# Patient Record
Sex: Female | Born: 1945
Health system: Southern US, Community
[De-identification: ages and names within clinical notes are randomized; demographics above are authoritative.]

## PROBLEM LIST (undated history)

## (undated) DIAGNOSIS — E669 Obesity, unspecified: Secondary | ICD-10-CM

## (undated) DIAGNOSIS — Z9114 Patient's other noncompliance with medication regimen: Secondary | ICD-10-CM

## (undated) DIAGNOSIS — J45909 Unspecified asthma, uncomplicated: Secondary | ICD-10-CM

## (undated) DIAGNOSIS — R0989 Other specified symptoms and signs involving the circulatory and respiratory systems: Secondary | ICD-10-CM

## (undated) DIAGNOSIS — N289 Disorder of kidney and ureter, unspecified: Secondary | ICD-10-CM

## (undated) DIAGNOSIS — D125 Benign neoplasm of sigmoid colon: Secondary | ICD-10-CM

## (undated) DIAGNOSIS — I5020 Unspecified systolic (congestive) heart failure: Secondary | ICD-10-CM

## (undated) DIAGNOSIS — K921 Melena: Secondary | ICD-10-CM

## (undated) DIAGNOSIS — I2510 Atherosclerotic heart disease of native coronary artery without angina pectoris: Secondary | ICD-10-CM

## (undated) DIAGNOSIS — K2970 Gastritis, unspecified, without bleeding: Secondary | ICD-10-CM

## (undated) DIAGNOSIS — D122 Benign neoplasm of ascending colon: Secondary | ICD-10-CM

## (undated) DIAGNOSIS — Z9289 Personal history of other medical treatment: Secondary | ICD-10-CM

## (undated) DIAGNOSIS — E870 Hyperosmolality and hypernatremia: Secondary | ICD-10-CM

## (undated) DIAGNOSIS — M1990 Unspecified osteoarthritis, unspecified site: Secondary | ICD-10-CM

## (undated) DIAGNOSIS — N186 End stage renal disease: Secondary | ICD-10-CM

## (undated) DIAGNOSIS — J9602 Acute respiratory failure with hypercapnia: Secondary | ICD-10-CM

## (undated) DIAGNOSIS — D329 Benign neoplasm of meninges, unspecified: Secondary | ICD-10-CM

## (undated) DIAGNOSIS — D649 Anemia, unspecified: Secondary | ICD-10-CM

## (undated) DIAGNOSIS — R410 Disorientation, unspecified: Secondary | ICD-10-CM

## (undated) DIAGNOSIS — Z91148 Patient's other noncompliance with medication regimen for other reason: Secondary | ICD-10-CM

## (undated) DIAGNOSIS — I5041 Acute combined systolic (congestive) and diastolic (congestive) heart failure: Secondary | ICD-10-CM

## (undated) DIAGNOSIS — D120 Benign neoplasm of cecum: Secondary | ICD-10-CM

## (undated) DIAGNOSIS — E785 Hyperlipidemia, unspecified: Secondary | ICD-10-CM

## (undated) DIAGNOSIS — I10 Essential (primary) hypertension: Secondary | ICD-10-CM

## (undated) DIAGNOSIS — E119 Type 2 diabetes mellitus without complications: Secondary | ICD-10-CM

## (undated) DIAGNOSIS — J90 Pleural effusion, not elsewhere classified: Secondary | ICD-10-CM

## (undated) HISTORY — PX: COLONOSCOPY: SHX174

## (undated) HISTORY — PX: ABDOMINAL HYSTERECTOMY: SHX81

## (undated) HISTORY — DX: Personal history of other medical treatment: Z92.89

## (undated) HISTORY — PX: TONSILLECTOMY: SUR1361

## (undated) HISTORY — PX: BREAST SURGERY: SHX581

---

## 1999-06-21 ENCOUNTER — Other Ambulatory Visit: Admission: RE | Admit: 1999-06-21 | Discharge: 1999-06-21 | Payer: Self-pay | Admitting: Family Medicine

## 2000-10-08 ENCOUNTER — Inpatient Hospital Stay (HOSPITAL_COMMUNITY): Admission: EM | Admit: 2000-10-08 | Discharge: 2000-10-10 | Payer: Self-pay | Admitting: Emergency Medicine

## 2000-10-08 ENCOUNTER — Encounter: Payer: Self-pay | Admitting: Emergency Medicine

## 2001-12-01 ENCOUNTER — Emergency Department (HOSPITAL_COMMUNITY): Admission: EM | Admit: 2001-12-01 | Discharge: 2001-12-01 | Payer: Self-pay | Admitting: Emergency Medicine

## 2001-12-01 ENCOUNTER — Encounter: Payer: Self-pay | Admitting: Emergency Medicine

## 2002-09-14 ENCOUNTER — Ambulatory Visit (HOSPITAL_COMMUNITY): Admission: RE | Admit: 2002-09-14 | Discharge: 2002-09-14 | Payer: Self-pay | Admitting: *Deleted

## 2004-02-05 ENCOUNTER — Emergency Department (HOSPITAL_COMMUNITY): Admission: EM | Admit: 2004-02-05 | Discharge: 2004-02-05 | Payer: Self-pay | Admitting: Family Medicine

## 2005-01-02 ENCOUNTER — Emergency Department (HOSPITAL_COMMUNITY): Admission: EM | Admit: 2005-01-02 | Discharge: 2005-01-02 | Payer: Self-pay | Admitting: Emergency Medicine

## 2005-02-16 ENCOUNTER — Emergency Department (HOSPITAL_COMMUNITY): Admission: EM | Admit: 2005-02-16 | Discharge: 2005-02-16 | Payer: Self-pay | Admitting: Emergency Medicine

## 2005-06-19 ENCOUNTER — Emergency Department (HOSPITAL_COMMUNITY): Admission: EM | Admit: 2005-06-19 | Discharge: 2005-06-20 | Payer: Self-pay | Admitting: Emergency Medicine

## 2005-12-05 ENCOUNTER — Ambulatory Visit (HOSPITAL_COMMUNITY): Admission: RE | Admit: 2005-12-05 | Discharge: 2005-12-05 | Payer: Self-pay | Admitting: Orthopaedic Surgery

## 2007-06-13 ENCOUNTER — Encounter: Admission: RE | Admit: 2007-06-13 | Discharge: 2007-06-13 | Payer: Self-pay | Admitting: Specialist

## 2007-12-20 ENCOUNTER — Encounter: Admission: RE | Admit: 2007-12-20 | Discharge: 2007-12-20 | Payer: Self-pay | Admitting: Specialist

## 2008-06-02 ENCOUNTER — Encounter: Admission: RE | Admit: 2008-06-02 | Discharge: 2008-06-02 | Payer: Self-pay | Admitting: Family Medicine

## 2008-07-04 ENCOUNTER — Encounter (INDEPENDENT_AMBULATORY_CARE_PROVIDER_SITE_OTHER): Payer: Self-pay | Admitting: Diagnostic Radiology

## 2008-07-04 ENCOUNTER — Encounter: Admission: RE | Admit: 2008-07-04 | Discharge: 2008-07-04 | Payer: Self-pay | Admitting: Infectious Diseases

## 2008-08-18 ENCOUNTER — Emergency Department (HOSPITAL_COMMUNITY): Admission: EM | Admit: 2008-08-18 | Discharge: 2008-08-18 | Payer: Self-pay | Admitting: Emergency Medicine

## 2008-09-01 ENCOUNTER — Encounter: Admission: RE | Admit: 2008-09-01 | Discharge: 2008-09-01 | Payer: Self-pay | Admitting: Surgery

## 2008-09-02 ENCOUNTER — Ambulatory Visit (HOSPITAL_BASED_OUTPATIENT_CLINIC_OR_DEPARTMENT_OTHER): Admission: RE | Admit: 2008-09-02 | Discharge: 2008-09-02 | Payer: Self-pay | Admitting: Surgery

## 2008-09-02 ENCOUNTER — Encounter (INDEPENDENT_AMBULATORY_CARE_PROVIDER_SITE_OTHER): Payer: Self-pay | Admitting: Surgery

## 2008-09-02 ENCOUNTER — Encounter: Admission: RE | Admit: 2008-09-02 | Discharge: 2008-09-02 | Payer: Self-pay | Admitting: Surgery

## 2010-11-22 LAB — CBC
Hemoglobin: 11.9 g/dL — ABNORMAL LOW (ref 12.0–15.0)
Platelets: 247 10*3/uL (ref 150–400)

## 2010-11-22 LAB — DIFFERENTIAL
Basophils Absolute: 0 10*3/uL (ref 0.0–0.1)
Eosinophils Absolute: 0.2 10*3/uL (ref 0.0–0.7)
Eosinophils Relative: 3 % (ref 0–5)
Lymphocytes Relative: 37 % (ref 12–46)
Neutro Abs: 3.1 10*3/uL (ref 1.7–7.7)
Neutrophils Relative %: 53 % (ref 43–77)

## 2010-11-22 LAB — BASIC METABOLIC PANEL
BUN: 24 mg/dL — ABNORMAL HIGH (ref 6–23)
Creatinine, Ser: 1.09 mg/dL (ref 0.4–1.2)
Glucose, Bld: 168 mg/dL — ABNORMAL HIGH (ref 70–99)
Potassium: 4.5 mEq/L (ref 3.5–5.1)

## 2010-11-22 LAB — GLUCOSE, CAPILLARY
Glucose-Capillary: 95 mg/dL (ref 70–99)
Glucose-Capillary: 97 mg/dL (ref 70–99)

## 2010-12-21 NOTE — Op Note (Signed)
NAMEVENBA, SAMADI              ACCOUNT NO.:  [id number]   MEDICAL RECORD NO.:  [id number]          PATIENT TYPE:  AMB   LOCATION:  [place]                          FACILITY:  [facility]   PHYSICIAN:  Thomas A. Cornett, M.D.DATE OF BIRTH:  [birth year]   DATE OF PROCEDURE:  09/02/2008  DATE OF DISCHARGE:                               OPERATIVE REPORT   PREOPERATIVE DIAGNOSIS:  Right breast mass.   POSTOPERATIVE DIAGNOSIS:  Right breast mass.   PROCEDURE:  Right breast needle-localized lumpectomy.   SURGEON:  Marcello Moores A. Cornett, M.D.   ANESTHESIA:  LMA 0.25% Sensorcaine with epinephrine.   ESTIMATED BLOOD LOSS:  10 mL.   SPECIMEN:  Right breast mass to pathology with a wire and clip specimen.   INDICATIONS FOR PROCEDURE:  The patient is a 65 year old female who had  a mass noted on recent mammography in her right breast.  Core biopsy  showed fibrosis and this was felt to be discordant with the imaging.  She presents for incisional biopsy of this right breast mass for  definitive diagnosis.  The risk of the procedure was discussed with the  patient preoperatively.  She understood and agreed to proceed.   DESCRIPTION OF PROCEDURE:  The patient was brought to the operating room  and placed supine.  After induction of general anesthesia, the right  breast was prepped and draped in a sterile fashion.  The wire was placed  preoperatively within place and this was trimmed down.  Curvilinear  incision was made after infiltration of the area with 0.25% Sensorcaine  and the entire area was excised to include the wire clip and the mass.  Radiographs revealed this to be in the specimen.  Irrigation was used to  irrigate out the right lumpectomy cavity and hemostasis was achieved  with cautery.  A 0 Vicryl was used to close the deep layer of the cavity  and 4-0 Monocryl was used to close the skin in a subcuticular fashion.  Dermabond was applied.  All final counts of sponge, needle, and  instruments were found to be correct at this portion of the case.  The  patient was then awoke and taken to recovery room in satisfactory  condition.  All final counts of sponge, needle, and instrument were  found to be correct at this portion of case.      Thomas A. Cornett, M.D.  Electronically Signed    TAC/MEDQ  D:  09/02/2008  T:  09/03/2008  Job:  [id number]   cc:   Tanya D. Hassell Done, M.D.

## 2011-02-10 ENCOUNTER — Other Ambulatory Visit: Payer: Self-pay | Admitting: Family Medicine

## 2011-02-10 DIAGNOSIS — C50919 Malignant neoplasm of unspecified site of unspecified female breast: Secondary | ICD-10-CM

## 2011-02-10 DIAGNOSIS — Z9889 Other specified postprocedural states: Secondary | ICD-10-CM

## 2011-02-21 ENCOUNTER — Ambulatory Visit
Admission: RE | Admit: 2011-02-21 | Discharge: 2011-02-21 | Disposition: A | Discharge status: Home / Self Care | Payer: Medicare Other | Source: Ambulatory Visit | Attending: Family Medicine | Admitting: Family Medicine

## 2011-02-21 ENCOUNTER — Other Ambulatory Visit: Payer: Self-pay | Admitting: Family Medicine

## 2011-02-21 DIAGNOSIS — Z1231 Encounter for screening mammogram for malignant neoplasm of breast: Secondary | ICD-10-CM

## 2011-02-21 DIAGNOSIS — Z9889 Other specified postprocedural states: Secondary | ICD-10-CM

## 2011-02-21 DIAGNOSIS — C50919 Malignant neoplasm of unspecified site of unspecified female breast: Secondary | ICD-10-CM

## 2012-04-13 ENCOUNTER — Encounter: Payer: Self-pay | Admitting: Gastroenterology

## 2013-01-03 ENCOUNTER — Encounter: Payer: Self-pay | Admitting: Gastroenterology

## 2013-08-30 ENCOUNTER — Other Ambulatory Visit: Payer: Self-pay | Admitting: Nephrology

## 2013-08-30 DIAGNOSIS — N183 Chronic kidney disease, stage 3 unspecified: Secondary | ICD-10-CM

## 2013-09-03 ENCOUNTER — Other Ambulatory Visit: Payer: Medicare Other

## 2013-09-05 ENCOUNTER — Ambulatory Visit
Admission: RE | Admit: 2013-09-05 | Discharge: 2013-09-05 | Disposition: A | Discharge status: Home / Self Care | Payer: PRIVATE HEALTH INSURANCE | Source: Ambulatory Visit | Attending: Nephrology | Admitting: Nephrology

## 2013-09-05 DIAGNOSIS — N183 Chronic kidney disease, stage 3 unspecified: Secondary | ICD-10-CM

## 2014-01-21 ENCOUNTER — Encounter (HOSPITAL_COMMUNITY): Payer: Self-pay | Admitting: Emergency Medicine

## 2014-01-21 ENCOUNTER — Inpatient Hospital Stay (HOSPITAL_COMMUNITY)
Admission: EM | Admit: 2014-01-21 | Discharge: 2014-01-30 | DRG: 149 | Disposition: A | Discharge status: Home Health | Payer: PRIVATE HEALTH INSURANCE | Attending: Internal Medicine | Admitting: Internal Medicine

## 2014-01-21 ENCOUNTER — Emergency Department (HOSPITAL_COMMUNITY): Payer: PRIVATE HEALTH INSURANCE

## 2014-01-21 ENCOUNTER — Inpatient Hospital Stay (HOSPITAL_COMMUNITY): Payer: PRIVATE HEALTH INSURANCE

## 2014-01-21 DIAGNOSIS — N184 Chronic kidney disease, stage 4 (severe): Secondary | ICD-10-CM | POA: Diagnosis present

## 2014-01-21 DIAGNOSIS — Z6835 Body mass index (BMI) 35.0-35.9, adult: Secondary | ICD-10-CM | POA: Diagnosis not present

## 2014-01-21 DIAGNOSIS — E1159 Type 2 diabetes mellitus with other circulatory complications: Secondary | ICD-10-CM | POA: Diagnosis present

## 2014-01-21 DIAGNOSIS — E1165 Type 2 diabetes mellitus with hyperglycemia: Secondary | ICD-10-CM | POA: Diagnosis present

## 2014-01-21 DIAGNOSIS — D329 Benign neoplasm of meninges, unspecified: Secondary | ICD-10-CM | POA: Diagnosis present

## 2014-01-21 DIAGNOSIS — I2589 Other forms of chronic ischemic heart disease: Secondary | ICD-10-CM | POA: Diagnosis present

## 2014-01-21 DIAGNOSIS — N179 Acute kidney failure, unspecified: Secondary | ICD-10-CM | POA: Diagnosis present

## 2014-01-21 DIAGNOSIS — I429 Cardiomyopathy, unspecified: Secondary | ICD-10-CM | POA: Diagnosis present

## 2014-01-21 DIAGNOSIS — H8110 Benign paroxysmal vertigo, unspecified ear: Secondary | ICD-10-CM | POA: Diagnosis present

## 2014-01-21 DIAGNOSIS — H8113 Benign paroxysmal vertigo, bilateral: Secondary | ICD-10-CM

## 2014-01-21 DIAGNOSIS — I129 Hypertensive chronic kidney disease with stage 1 through stage 4 chronic kidney disease, or unspecified chronic kidney disease: Secondary | ICD-10-CM | POA: Diagnosis present

## 2014-01-21 DIAGNOSIS — N183 Chronic kidney disease, stage 3 unspecified: Secondary | ICD-10-CM

## 2014-01-21 DIAGNOSIS — F17200 Nicotine dependence, unspecified, uncomplicated: Secondary | ICD-10-CM | POA: Diagnosis present

## 2014-01-21 DIAGNOSIS — D649 Anemia, unspecified: Secondary | ICD-10-CM | POA: Diagnosis present

## 2014-01-21 DIAGNOSIS — E785 Hyperlipidemia, unspecified: Secondary | ICD-10-CM | POA: Diagnosis present

## 2014-01-21 DIAGNOSIS — D638 Anemia in other chronic diseases classified elsewhere: Secondary | ICD-10-CM | POA: Diagnosis present

## 2014-01-21 DIAGNOSIS — I2510 Atherosclerotic heart disease of native coronary artery without angina pectoris: Secondary | ICD-10-CM | POA: Diagnosis present

## 2014-01-21 DIAGNOSIS — R42 Dizziness and giddiness: Secondary | ICD-10-CM

## 2014-01-21 DIAGNOSIS — N186 End stage renal disease: Secondary | ICD-10-CM | POA: Diagnosis present

## 2014-01-21 DIAGNOSIS — E669 Obesity, unspecified: Secondary | ICD-10-CM | POA: Diagnosis present

## 2014-01-21 DIAGNOSIS — I798 Other disorders of arteries, arterioles and capillaries in diseases classified elsewhere: Secondary | ICD-10-CM | POA: Diagnosis present

## 2014-01-21 DIAGNOSIS — Z992 Dependence on renal dialysis: Secondary | ICD-10-CM

## 2014-01-21 DIAGNOSIS — I160 Hypertensive urgency: Secondary | ICD-10-CM | POA: Diagnosis present

## 2014-01-21 DIAGNOSIS — I255 Ischemic cardiomyopathy: Secondary | ICD-10-CM

## 2014-01-21 DIAGNOSIS — I420 Dilated cardiomyopathy: Secondary | ICD-10-CM

## 2014-01-21 DIAGNOSIS — E1169 Type 2 diabetes mellitus with other specified complication: Secondary | ICD-10-CM | POA: Diagnosis present

## 2014-01-21 DIAGNOSIS — E1122 Type 2 diabetes mellitus with diabetic chronic kidney disease: Secondary | ICD-10-CM

## 2014-01-21 DIAGNOSIS — I10 Essential (primary) hypertension: Secondary | ICD-10-CM

## 2014-01-21 DIAGNOSIS — R9431 Abnormal electrocardiogram [ECG] [EKG]: Secondary | ICD-10-CM | POA: Diagnosis present

## 2014-01-21 DIAGNOSIS — E139 Other specified diabetes mellitus without complications: Secondary | ICD-10-CM | POA: Diagnosis present

## 2014-01-21 DIAGNOSIS — Z720 Tobacco use: Secondary | ICD-10-CM | POA: Diagnosis present

## 2014-01-21 DIAGNOSIS — E1129 Type 2 diabetes mellitus with other diabetic kidney complication: Secondary | ICD-10-CM | POA: Diagnosis present

## 2014-01-21 DIAGNOSIS — I639 Cerebral infarction, unspecified: Secondary | ICD-10-CM

## 2014-01-21 DIAGNOSIS — Z794 Long term (current) use of insulin: Secondary | ICD-10-CM | POA: Diagnosis not present

## 2014-01-21 HISTORY — DX: Atherosclerotic heart disease of native coronary artery without angina pectoris: I25.10

## 2014-01-21 HISTORY — DX: Hypertensive urgency: I16.0

## 2014-01-21 HISTORY — DX: Benign neoplasm of meninges, unspecified: D32.9

## 2014-01-21 HISTORY — DX: Type 2 diabetes mellitus without complications: E11.9

## 2014-01-21 HISTORY — DX: Anemia, unspecified: D64.9

## 2014-01-21 HISTORY — DX: Obesity, unspecified: E66.9

## 2014-01-21 HISTORY — DX: Hyperlipidemia, unspecified: E78.5

## 2014-01-21 HISTORY — DX: Essential (primary) hypertension: I10

## 2014-01-21 HISTORY — DX: Tobacco use: Z72.0

## 2014-01-21 HISTORY — DX: Unspecified systolic (congestive) heart failure: I50.20

## 2014-01-21 LAB — LIPID PANEL
CHOL/HDL RATIO: 4.8 ratio
Cholesterol: 253 mg/dL — ABNORMAL HIGH (ref 0–200)
HDL: 53 mg/dL (ref >39)
LDL CALC: 173 mg/dL — AB (ref 0–99)
Triglycerides: 134 mg/dL (ref <150)
VLDL: 27 mg/dL (ref 0–40)

## 2014-01-21 LAB — I-STAT CHEM 8, ED
BUN: 53 mg/dL — AB (ref 6–23)
CALCIUM ION: 0.99 mmol/L — AB (ref 1.13–1.30)
CHLORIDE: 105 meq/L (ref 96–112)
CREATININE: 2 mg/dL — AB (ref 0.50–1.10)
GLUCOSE: 166 mg/dL — AB (ref 70–99)
HCT: 39 % (ref 36.0–46.0)
Hemoglobin: 13.3 g/dL (ref 12.0–15.0)
POTASSIUM: 6.7 meq/L — AB (ref 3.7–5.3)
SODIUM: 137 meq/L (ref 137–147)
TCO2: 24 mmol/L (ref 0–100)

## 2014-01-21 LAB — GLUCOSE, CAPILLARY
GLUCOSE-CAPILLARY: 133 mg/dL — AB (ref 70–99)
GLUCOSE-CAPILLARY: 97 mg/dL (ref 70–99)
Glucose-Capillary: 161 mg/dL — ABNORMAL HIGH (ref 70–99)

## 2014-01-21 LAB — TROPONIN I: Troponin I: <0.3 ng/mL (ref <0.30)

## 2014-01-21 LAB — BASIC METABOLIC PANEL
BUN: 39 mg/dL — ABNORMAL HIGH (ref 6–23)
CO2: 25 mEq/L (ref 19–32)
Calcium: 8.5 mg/dL (ref 8.4–10.5)
Chloride: 102 mEq/L (ref 96–112)
Creatinine, Ser: 1.86 mg/dL — ABNORMAL HIGH (ref 0.50–1.10)
GFR calc non Af Amer: 27 mL/min — ABNORMAL LOW (ref >90)
GFR, EST AFRICAN AMERICAN: 31 mL/min — AB (ref >90)
Glucose, Bld: 164 mg/dL — ABNORMAL HIGH (ref 70–99)
Potassium: 4.6 mEq/L (ref 3.7–5.3)
Sodium: 139 mEq/L (ref 137–147)

## 2014-01-21 LAB — HEMOGLOBIN A1C
Hgb A1c MFr Bld: 7.2 % — ABNORMAL HIGH (ref <5.7)
Mean Plasma Glucose: 160 mg/dL — ABNORMAL HIGH (ref <117)

## 2014-01-21 LAB — CBC
HEMATOCRIT: 36.4 % (ref 36.0–46.0)
HEMOGLOBIN: 11.5 g/dL — AB (ref 12.0–15.0)
MCH: 28.2 pg (ref 26.0–34.0)
MCHC: 31.6 g/dL (ref 30.0–36.0)
MCV: 89.2 fL (ref 78.0–100.0)
PLATELETS: 249 10*3/uL (ref 150–400)
RBC: 4.08 MIL/uL (ref 3.87–5.11)
RDW: 14.5 % (ref 11.5–15.5)
WBC: 4.5 10*3/uL (ref 4.0–10.5)

## 2014-01-21 LAB — TSH: TSH: 0.661 u[IU]/mL (ref 0.350–4.500)

## 2014-01-21 LAB — CBG MONITORING, ED
Glucose-Capillary: 147 mg/dL — ABNORMAL HIGH (ref 70–99)
Glucose-Capillary: 162 mg/dL — ABNORMAL HIGH (ref 70–99)

## 2014-01-21 LAB — PRO B NATRIURETIC PEPTIDE
Pro B Natriuretic peptide (BNP): 3351 pg/mL — ABNORMAL HIGH (ref 0–125)
Pro B Natriuretic peptide (BNP): 4302 pg/mL — ABNORMAL HIGH (ref 0–125)

## 2014-01-21 LAB — I-STAT CG4 LACTIC ACID, ED: Lactic Acid, Venous: 0.89 mmol/L (ref 0.5–2.2)

## 2014-01-21 MED ORDER — INSULIN ASPART 100 UNIT/ML ~~LOC~~ SOLN
0.0000 [IU] | SUBCUTANEOUS | Status: DC
Start: 2014-01-21 — End: 2014-01-22
  Administered 2014-01-21: 1 [IU] via SUBCUTANEOUS
  Administered 2014-01-22 (×2): 2 [IU] via SUBCUTANEOUS
  Administered 2014-01-22: 1 [IU] via SUBCUTANEOUS

## 2014-01-21 MED ORDER — MECLIZINE HCL 25 MG PO TABS
25.0000 mg | ORAL_TABLET | Freq: Once | ORAL | Status: AC
  Administered 2014-01-21: 25 mg via ORAL
  Filled 2014-01-21: qty 1

## 2014-01-21 MED ORDER — METOPROLOL TARTRATE 25 MG PO TABS
25.0000 mg | ORAL_TABLET | Freq: Two times a day (BID) | ORAL | Status: DC
  Administered 2014-01-21 – 2014-01-23 (×5): 25 mg via ORAL
  Filled 2014-01-21 (×7): qty 1

## 2014-01-21 MED ORDER — ASPIRIN EC 81 MG PO TBEC
81.0000 mg | DELAYED_RELEASE_TABLET | Freq: Every day | ORAL | Status: DC
  Administered 2014-01-21 – 2014-01-27 (×7): 81 mg via ORAL
  Filled 2014-01-21 (×7): qty 1

## 2014-01-21 MED ORDER — IRBESARTAN 300 MG PO TABS
300.0000 mg | ORAL_TABLET | Freq: Every day | ORAL | Status: DC
  Administered 2014-01-21 – 2014-01-23 (×3): 300 mg via ORAL
  Filled 2014-01-21 (×3): qty 1

## 2014-01-21 MED ORDER — FUROSEMIDE 40 MG PO TABS
40.0000 mg | ORAL_TABLET | Freq: Every day | ORAL | Status: DC
  Administered 2014-01-21 – 2014-01-24 (×4): 40 mg via ORAL
  Filled 2014-01-21 (×5): qty 1

## 2014-01-21 MED ORDER — NICOTINE 21 MG/24HR TD PT24
21.0000 mg | MEDICATED_PATCH | Freq: Every day | TRANSDERMAL | Status: DC
  Administered 2014-01-21 – 2014-01-30 (×10): 21 mg via TRANSDERMAL
  Filled 2014-01-21 (×10): qty 1

## 2014-01-21 MED ORDER — INSULIN DETEMIR 100 UNIT/ML ~~LOC~~ SOLN
25.0000 [IU] | Freq: Every day | SUBCUTANEOUS | Status: DC
  Administered 2014-01-21 – 2014-01-22 (×2): 25 [IU] via SUBCUTANEOUS
  Filled 2014-01-21 (×3): qty 0.25

## 2014-01-21 MED ORDER — LABETALOL HCL 5 MG/ML IV SOLN
10.0000 mg | Freq: Once | INTRAVENOUS | Status: AC
  Administered 2014-01-21: 10 mg via INTRAVENOUS
  Filled 2014-01-21: qty 4

## 2014-01-21 MED ORDER — SODIUM CHLORIDE 0.9 % IV BOLUS (SEPSIS)
250.0000 mL | Freq: Once | INTRAVENOUS | Status: AC
  Administered 2014-01-21: 250 mL via INTRAVENOUS

## 2014-01-21 MED ORDER — ENOXAPARIN SODIUM 40 MG/0.4ML ~~LOC~~ SOLN
40.0000 mg | SUBCUTANEOUS | Status: DC
  Administered 2014-01-21 – 2014-01-26 (×6): 40 mg via SUBCUTANEOUS
  Filled 2014-01-21 (×8): qty 0.4

## 2014-01-21 NOTE — H&P (Addendum)
[facility] History and Physical  LEANORA [name] BVQ:[id number] DOB: 05-Feb-1946 DOA: 01/21/2014  Referring physician:  PCP: No primary Orpah Hausner on file.  Specialists:   Chief Complaint:  HPI: Kathleen [name] is a 68 y.o. BF PMHx  HTN, diabetes type 2, renal disorder, presents to the ER for evaluation of dizziness. Patient reports that she had onset of feeling "lightheaded" last night. She sat on the edge of her bed for approximately an hour and it did not improve. She asked her granddaughter to make her a sandwich, he had started to feel better. She was able to watch a movie without any further dizziness and went to sleep. When she woke up this morning, however, symptoms have returned. She was feeling lightheaded and also felt like she was spinning. She has not had any headache or blurred vision. There is no chest pain, palpitations, shortness of breath.  Family is concerned because she does not have air conditioning, apartment has been very hot the last couple of days. Upon arrival to the floor patient states when standing up has sensation of going/blacking out at times, at other times has feeling the room spinning (vertigo). Negative head trauma, negative recent illness, negative tinnitus,.   Review of Systems: The patient denies anorexia, fever, weight loss,, vision loss, decreased hearing, hoarseness, chest pain, dyspnea on exertion, peripheral edema,hemoptysis, abdominal pain, melena, hematochezia, severe indigestion/heartburn, hematuria, incontinence, genital sores, muscle weakness, suspicious skin lesions, transient blindness, depression, unusual weight change, abnormal bleeding, enlarged lymph nodes, angioedema, and breast masses.    TRAVEL HISTORY: N/A    Consultants:  NA  Procedure/Significant Events:  6/16 CT head without counter;Unremarkable noncontrast head CT 6/16 MRI/MRA; No acute infarct.  -Left parietal region extra-axial lenticular shaped 3.5 x 2.8 x 1 cm lesion  suggestive meningioma.     Culture  6/16 urine pending 6/16 blood pending   Antibiotics:  NA   DVT prophylaxis:  Lovenox  Devices  NA   LINES / TUBES:  6/16 22ga right forearm   Past Medical History  Diagnosis Date  . Diabetes mellitus without complication   . Renal disorder   . Hypertension    Past Surgical History  Procedure Laterality Date  . Abdominal hysterectomy    . Breast surgery    . Tonsillectomy     Social History:  1/2 PPD x30 years. She does not have any smokeless tobacco history on file. She reports that she does not drink alcohol. Her drug history is not on file.    No Known Allergies  Family History  Problem Relation Age of Onset  . Diabetic kidney disease Mother   . Hypertension Mother   . Hypertension Father      Prior to Admission medications   Medication Sig Start Date End Date Taking? Authorizing Rylah Fukuda  cephALEXin (KEFLEX) 500 MG capsule Take 500 mg by mouth 4 (four) times daily.   Yes Historical Tavish Gettis, MD  furosemide (LASIX) 40 MG tablet Take 40 mg by mouth daily.   Yes Historical Alyne Martinson, MD  hydrochlorothiazide (HYDRODIURIL) 25 MG tablet Take 25 mg by mouth daily.   Yes Historical Loyde Orth, MD  insulin detemir (LEVEMIR) 100 UNIT/ML injection Inject 25 Units into the skin at bedtime.   Yes Historical Ashely Goosby, MD  pioglitazone-metformin (ACTOPLUS MET) 15-850 MG per tablet Take 1 tablet by mouth 2 (two) times daily with a meal.   Yes Historical Elfriede Bonini, MD  telmisartan (MICARDIS) 80 MG tablet Take 80 mg by mouth daily.   Yes Historical  Janny Crute, MD   Physical Exam: Filed Vitals:   01/21/14 1200 01/21/14 1311 01/21/14 1403 01/21/14 1451  BP: 194/88 187/91    Pulse: 71 80  78  Temp:   98.5 F (36.9 C) 98.4 F (36.9 C)  TempSrc:    Oral  Resp: _0 Height:    _1  (1.6 m)  Weight:    90.8 kg (200 lb 2.8 oz)  SpO2: 91% 97%  98%     General:  A./O. x4, NAD,  Eyes: Pupils equal reactive to light and  accommodation  Neck: Negative lymphadenopathy, negative JVD  Cardiovascular: Regular rhythm and rate, negative murmurs rubs or gallops, normal S1/S2  Respiratory: Clear to auscultation bilateral  Abdomen: Soft, nontender, nondistended, plus bowel sound  Skin: Small right shin laceration where patient burst blister  Musculoskeletal: Negative pedal edema, negative cyanosis  Neurologic: Cranial nerves II through XII intact, tongue/uvula midline, muscle strength all extremities 5/5, sensation diminished left lower extremity, did not ambulate patient.   Labs on Admission:  Basic Metabolic Panel:  Recent Labs Lab 01/21/14 1020 01/21/14 1025  NA 139 137  K 4.6 6.7*  CL 102 105  CO2 25  --   GLUCOSE 164* 166*  BUN 39* 53*  CREATININE 1.86* 2.00*  CALCIUM 8.5  --    Liver Function Tests: No results found for this basename: AST, ALT, ALKPHOS, BILITOT, PROT, ALBUMIN,  in the last 168 hours No results found for this basename: LIPASE, AMYLASE,  in the last 168 hours No results found for this basename: AMMONIA,  in the last 168 hours CBC:  Recent Labs Lab 01/21/14 1020 01/21/14 1025  WBC 4.5  --   HGB 11.5* 13.3  HCT 36.4 39.0  MCV 89.2  --   PLT 249  --    Cardiac Enzymes:  Recent Labs Lab 01/21/14 1020  TROPONINI <0.30    BNP (last 3 results)  Recent Labs  01/21/14 1020  PROBNP 3351.0*   CBG:  Recent Labs Lab 01/21/14 0926 01/21/14 1005  GLUCAP 147* 162*    Radiological Exams on Admission: Ct Head Wo Contrast  01/21/2014   CLINICAL DATA:  Dizziness with nausea.  Possible dehydration.  EXAM: CT HEAD WITHOUT CONTRAST  TECHNIQUE: Contiguous axial images were obtained from the base of the skull through the vertex without intravenous contrast.  COMPARISON:  None.  FINDINGS: There is no evidence of acute intracranial hemorrhage, mass lesion, brain edema or extra-axial fluid collection. The ventricles and subarachnoid spaces are appropriately sized for age.  There is no CT evidence of acute cortical infarction. Minimal fat density is noted along the interhemispheric fissure.  The visualized paranasal sinuses, mastoid air cells and middle ears are clear. The calvarium is intact.  IMPRESSION: Unremarkable noncontrast head CT.   Electronically Signed   By: Camie Patience M.D.   On: 01/21/2014 10:33    EKG: No previous EKG for comparison; the T waves in 1, aVL, V5, V6 cannot rule out lateral ischemia. Prolonged QT interval,   Assessment/Plan Active Problems:   Vertigo   Stroke   Nonspecific abnormal electrocardiogram (ECG) (EKG)   Hypertensive urgency   Nicotine dependence   Type II or unspecified type diabetes mellitus with peripheral circulatory disorders, uncontrolled(250.72)   TIA vs stroke vs presyncope vs vertigo -Patient's head CT was negative. Currently obtaining a brain MRI/MRA -Counseled patient if findings on MRI are positive we'll need to transfer her to [facility] . -Obtain urine culture -  Obtain blood culture -Aspirin 81 mg daily  Hypertensive urgency -Start Metoprolol 25 mg BID  -Continue Lasix 40 mg daily (home meds) -And place of patient's home dose of telmisartan start Ibesartan 300 mg daily  CHF? -Echocardiogram scheduled  Abnormal EKG -Obtain troponin x3 -Obtain pro BNP   Diabetes type 2 -Hold Actosplus -Continue Levemir 25 units daily -Start sensitive SSI -Obtain hemoglobin A1c -Obtain lipid panel  Nicotine dependence -Nicotine patch     Code Status: Full Family Communication:  None Disposition Plan:  Completion of workup for stroke vs TIA vs CHF  Time spent:  90 minutes  WOODS, Geraldo Docker [facility] Pager [phone]  If 7PM-7AM, please contact night-coverage [email or web address] Password [email or web address] 01/21/2014, 4:44 PM

## 2014-01-21 NOTE — ED Notes (Signed)
Tried Ambulating patient but she was to dizzy to get out of the bed.

## 2014-01-21 NOTE — ED Notes (Signed)
Patient started with dizziness last night, patient reports no prior history of this.  She has been living in an apartment where the air conditioning is not working so it has been very hot.  Patient also had a period of nausea this morning but no vomiting.  Patient thinks she may be dehydrated.  Patient reports her dizziness was more like lightheadedness, but also felt the room was spinning.

## 2014-01-21 NOTE — ED Provider Notes (Signed)
CSN: [id number]     Arrival date & time 01/21/14  3825 History   First MD Initiated Contact with Patient 01/21/14 [phone]     Chief Complaint  Patient presents with  . Dizziness     (Consider location/radiation/quality/duration/timing/severity/associated sxs/prior Treatment) HPI Comments: Patient presents to the ER for evaluation of dizziness. Patient reports that she had onset of feeling "lightheaded" last night. She sat on the edge of her bed for approximately an hour and it did not improve. She asked her granddaughter to make her a sandwich, he had started to feel better. She was able to watch a movie without any further dizziness and went to sleep. When she woke up this morning, however, symptoms have returned. She was feeling lightheaded and also felt like she was spinning. She has not had any headache or blurred vision. There is no chest pain, palpitations, shortness of breath.  Family is concerned because she does not have air conditioning, apartment has been very hot the last couple of days.  Patient is a 68 y.o. female presenting with dizziness.  Dizziness   Past Medical History  Diagnosis Date  . Diabetes mellitus without complication   . Renal disorder   . Hypertension    Past Surgical History  Procedure Laterality Date  . Abdominal hysterectomy    . Breast surgery    . Tonsillectomy     No family history on file. History  Substance Use Topics  . Smoking status: Current Every Day Smoker    Types: Cigarettes  . Smokeless tobacco: Not on file  . Alcohol Use: No   OB History   Grav Para Term Preterm Abortions TAB SAB Ect Mult Living                 Review of Systems  Neurological: Positive for dizziness.  All other systems reviewed and are negative.     Allergies  Review of patient's allergies indicates no known allergies.  Home Medications   Prior to Admission medications   Medication Sig Start Date End Date Taking? Authorizing Kenita Bines  cephALEXin  (KEFLEX) 500 MG capsule Take 500 mg by mouth 4 (four) times daily.   Yes Historical Jachin Coury, MD  furosemide (LASIX) 40 MG tablet Take 40 mg by mouth daily.   Yes Historical Kaylaann Mountz, MD  hydrochlorothiazide (HYDRODIURIL) 25 MG tablet Take 25 mg by mouth daily.   Yes Historical Raylen Tangonan, MD  insulin detemir (LEVEMIR) 100 UNIT/ML injection Inject 25 Units into the skin at bedtime.   Yes Historical Chamika Cunanan, MD  pioglitazone-metformin (ACTOPLUS MET) 15-850 MG per tablet Take 1 tablet by mouth 2 (two) times daily with a meal.   Yes Historical Adriana Quinby, MD  telmisartan (MICARDIS) 80 MG tablet Take 80 mg by mouth daily.   Yes Historical Griffon Herberg, MD   BP 187/91  Pulse 80  Temp(Src) 97.9 F (36.6 C) (Oral)  Resp 21  SpO2 97% Physical Exam  Constitutional: She is oriented to person, place, and time. She appears well-developed and well-nourished. No distress.  HENT:  Head: Normocephalic and atraumatic.  Right Ear: Hearing normal.  Left Ear: Hearing normal.  Nose: Nose normal.  Mouth/Throat: Oropharynx is clear and moist and mucous membranes are normal.  Eyes: Conjunctivae and EOM are normal. Pupils are equal, round, and reactive to light.  Neck: Normal range of motion. Neck supple.  Cardiovascular: Regular rhythm, S1 normal and S2 normal.  Exam reveals no gallop and no friction rub.   No murmur heard. Pulmonary/Chest: Effort normal and  breath sounds normal. No respiratory distress. She exhibits no tenderness.  Abdominal: Soft. Normal appearance and bowel sounds are normal. There is no hepatosplenomegaly. There is no tenderness. There is no rebound, no guarding, no tenderness at McBurney's point and negative Murphy's sign. No hernia.  Musculoskeletal: Normal range of motion.  Neurological: She is alert and oriented to person, place, and time. She has normal strength. No cranial nerve deficit or sensory deficit. Coordination normal. GCS eye subscore is 4. GCS verbal subscore is 5. GCS motor  subscore is 6.  Extraocular muscle movement: normal No visual field cut Pupils: equal and reactive both direct and consensual response is normal No nystagmus present  Sensory function is intact to light touch, pinprick Proprioception intact  Grip strength 5/5 symmetric in upper extremities Lower extremity strength 5/5 against gravity No pronator drift Normal finger to nose bilaterally Normal heel to shin bilaterally  Gait: normal Rhomberg: normal   Skin: Skin is warm, dry and intact. No rash noted. No cyanosis.  Psychiatric: She has a normal mood and affect. Her speech is normal and behavior is normal. Thought content normal.    ED Course  Procedures (including critical care time) Labs Review Labs Reviewed  CBC - Abnormal; Notable for the following:    Hemoglobin 11.5 (*)    All other components within normal limits  BASIC METABOLIC PANEL - Abnormal; Notable for the following:    Glucose, Bld 164 (*)    BUN 39 (*)    Creatinine, Ser 1.86 (*)    GFR calc non Af Amer 27 (*)    GFR calc Af Amer 31 (*)    All other components within normal limits  PRO B NATRIURETIC PEPTIDE - Abnormal; Notable for the following:    Pro B Natriuretic peptide (BNP) 3351.0 (*)    All other components within normal limits  CBG MONITORING, ED - Abnormal; Notable for the following:    Glucose-Capillary 147 (*)    All other components within normal limits  I-STAT CHEM 8, ED - Abnormal; Notable for the following:    Potassium 6.7 (*)    BUN 53 (*)    Creatinine, Ser 2.00 (*)    Glucose, Bld 166 (*)    Calcium, Ion 0.99 (*)    All other components within normal limits  CBG MONITORING, ED - Abnormal; Notable for the following:    Glucose-Capillary 162 (*)    All other components within normal limits  TROPONIN I  I-STAT CG4 LACTIC ACID, ED    Imaging Review Ct Head Wo Contrast  01/21/2014   CLINICAL DATA:  Dizziness with nausea.  Possible dehydration.  EXAM: CT HEAD WITHOUT CONTRAST   TECHNIQUE: Contiguous axial images were obtained from the base of the skull through the vertex without intravenous contrast.  COMPARISON:  None.  FINDINGS: There is no evidence of acute intracranial hemorrhage, mass lesion, brain edema or extra-axial fluid collection. The ventricles and subarachnoid spaces are appropriately sized for age. There is no CT evidence of acute cortical infarction. Minimal fat density is noted along the interhemispheric fissure.  The visualized paranasal sinuses, mastoid air cells and middle ears are clear. The calvarium is intact.  IMPRESSION: Unremarkable noncontrast head CT.   Electronically Signed   By: Camie Patience M.D.   On: 01/21/2014 10:33     EKG Interpretation   Date/Time:  Tuesday January 21 2014 09:34:37 EDT Ventricular Rate:  89 PR Interval:  154 QRS Duration: 83 QT Interval:  420 QTC Calculation:  511 R Axis:   59 Text Interpretation:  Sinus rhythm Probable LVH with secondary repol abnrm  Prolonged QT interval No significant change since last tracing Confirmed  by POLLINA  MD, CHRISTOPHER [phone]) on 01/21/2014 9:38:42 AM      MDM   Final diagnoses:  Dizziness  Acute kidney injury  Uncontrolled hypertension    Patient returns to the ER because of dizziness. Patient reports she had an episode of dizziness last night did resolve after a few hours. When she woke this morning she was feeling the symptoms again. She did not have any headache associated with the dizziness. CT head does not show any evidence of infarct. Neurologic examination could not feel any focal deficits. I cannot rule out central causes of the patient's dizziness which is occasionally vertiginous.  Patient was noted to be mildly hypertensive upon arrival but this has progressively worsened here in the ER despite treatment. No chest pain or shortness of breath. EKG unchanged from previous and troponin negative.  Patient was found to have acute kidney injury, possibly secondary to  dehydration. She has a BUN and creatinine above her baseline. She reportedly has been in a very hot environment for the last couple of days.  Patient continues to be symptomatic. An attempt to ambulate her wasn't successful because of the dizziness. Because of this, patient will be hospitalized for further management of her uncontrolled blood pressure and further workup of dizziness and vertigo.  Orpah Greek, MD 01/21/14 1329

## 2014-01-21 NOTE — ED Notes (Signed)
Patient sleeping, son at bedside.

## 2014-01-21 NOTE — ED Notes (Signed)
Pt states that she is a diabetic but doesn't check her blood sugar regularly.  Pt states "they are always fine when i check them."

## 2014-01-21 NOTE — ED Notes (Signed)
Kathleen [name] told Dr Kathleen [name] room 22 potassium was high

## 2014-01-21 NOTE — ED Notes (Signed)
Patient extremely unsteady on her feet when standing.  Two person assist to stay standing safely.

## 2014-01-21 NOTE — ED Notes (Signed)
Pt states that she was in the bed and the room was spinning around. Thought she needed something to eat and ate part of a sandwich and had something to drink and stayed in her bed for about an hour then she felt ok.  Per family member her apartment complex doesn't have adequate air conditioning and it has been over 90 degrees in her building the past couple of days. Then pt states that when she woke up this morning she was lightheaded again.

## 2014-01-22 DIAGNOSIS — E1169 Type 2 diabetes mellitus with other specified complication: Secondary | ICD-10-CM | POA: Diagnosis present

## 2014-01-22 DIAGNOSIS — H8110 Benign paroxysmal vertigo, unspecified ear: Secondary | ICD-10-CM

## 2014-01-22 DIAGNOSIS — I517 Cardiomegaly: Secondary | ICD-10-CM

## 2014-01-22 DIAGNOSIS — E785 Hyperlipidemia, unspecified: Secondary | ICD-10-CM | POA: Diagnosis present

## 2014-01-22 DIAGNOSIS — I429 Cardiomyopathy, unspecified: Secondary | ICD-10-CM | POA: Diagnosis present

## 2014-01-22 HISTORY — DX: Benign paroxysmal vertigo, unspecified ear: H81.10

## 2014-01-22 LAB — URINALYSIS, ROUTINE W REFLEX MICROSCOPIC
BILIRUBIN URINE: NEGATIVE
Glucose, UA: NEGATIVE mg/dL
HGB URINE DIPSTICK: NEGATIVE
KETONES UR: NEGATIVE mg/dL
Leukocytes, UA: NEGATIVE
Nitrite: NEGATIVE
PH: 6 (ref 5.0–8.0)
Protein, ur: 100 mg/dL — AB
SPECIFIC GRAVITY, URINE: 1.009 (ref 1.005–1.030)
Urobilinogen, UA: 0.2 mg/dL (ref 0.0–1.0)

## 2014-01-22 LAB — BASIC METABOLIC PANEL
BUN: 40 mg/dL — ABNORMAL HIGH (ref 6–23)
CO2: 24 meq/L (ref 19–32)
Calcium: 8.6 mg/dL (ref 8.4–10.5)
Chloride: 105 mEq/L (ref 96–112)
Creatinine, Ser: 2.05 mg/dL — ABNORMAL HIGH (ref 0.50–1.10)
GFR calc Af Amer: 27 mL/min — ABNORMAL LOW (ref >90)
GFR, EST NON AFRICAN AMERICAN: 24 mL/min — AB (ref >90)
GLUCOSE: 76 mg/dL (ref 70–99)
POTASSIUM: 4.5 meq/L (ref 3.7–5.3)
Sodium: 142 mEq/L (ref 137–147)

## 2014-01-22 LAB — TROPONIN I: Troponin I: <0.3 ng/mL (ref <0.30)

## 2014-01-22 LAB — URINE MICROSCOPIC-ADD ON

## 2014-01-22 LAB — GLUCOSE, CAPILLARY
GLUCOSE-CAPILLARY: 180 mg/dL — AB (ref 70–99)
Glucose-Capillary: 100 mg/dL — ABNORMAL HIGH (ref 70–99)
Glucose-Capillary: 107 mg/dL — ABNORMAL HIGH (ref 70–99)
Glucose-Capillary: 52 mg/dL — ABNORMAL LOW (ref 70–99)
Glucose-Capillary: 124 mg/dL — ABNORMAL HIGH (ref 70–99)
Glucose-Capillary: 176 mg/dL — ABNORMAL HIGH (ref 70–99)

## 2014-01-22 MED ORDER — MECLIZINE HCL 12.5 MG PO TABS
12.5000 mg | ORAL_TABLET | Freq: Three times a day (TID) | ORAL | Status: DC | PRN
  Filled 2014-01-22: qty 1

## 2014-01-22 MED ORDER — SIMVASTATIN 20 MG PO TABS
20.0000 mg | ORAL_TABLET | Freq: Every day | ORAL | Status: DC
  Administered 2014-01-22 – 2014-01-27 (×6): 20 mg via ORAL
  Filled 2014-01-22 (×8): qty 1

## 2014-01-22 MED ORDER — INSULIN ASPART 100 UNIT/ML ~~LOC~~ SOLN
0.0000 [IU] | Freq: Three times a day (TID) | SUBCUTANEOUS | Status: DC
Start: 2014-01-23 — End: 2014-01-30
  Administered 2014-01-23 (×2): 3 [IU] via SUBCUTANEOUS
  Administered 2014-01-24: 5 [IU] via SUBCUTANEOUS
  Administered 2014-01-25 (×2): 2 [IU] via SUBCUTANEOUS
  Administered 2014-01-26: 5 [IU] via SUBCUTANEOUS
  Administered 2014-01-27 – 2014-01-28 (×2): 2 [IU] via SUBCUTANEOUS
  Administered 2014-01-28: 3 [IU] via SUBCUTANEOUS
  Administered 2014-01-28: 5 [IU] via SUBCUTANEOUS
  Administered 2014-01-29: 2 [IU] via SUBCUTANEOUS
  Administered 2014-01-29: 3 [IU] via SUBCUTANEOUS
  Administered 2014-01-30: 2 [IU] via SUBCUTANEOUS
  Administered 2014-01-30: 5 [IU] via SUBCUTANEOUS

## 2014-01-22 MED ORDER — INSULIN ASPART 100 UNIT/ML ~~LOC~~ SOLN
0.0000 [IU] | Freq: Every day | SUBCUTANEOUS | Status: DC
  Administered 2014-01-26 – 2014-01-27 (×2): 4 [IU] via SUBCUTANEOUS
  Administered 2014-01-29: 3 [IU] via SUBCUTANEOUS

## 2014-01-22 NOTE — Evaluation (Signed)
Physical Therapy Evaluation Patient Details Name: Kathleen [name] MRN: [id number] DOB: 04-15-1946 Today's Date: 01/22/2014   History of Present Illness  Pt is a 68 year old female with PMHx of HTN, DM, and renal disorder admitted 6/16 for dizziness/lightheadedness and stroke/TIA workup.  MRI negative for acute infarct.  Clinical Impression  Pt admitted with dizziness, possible TIA. Pt currently with functional limitations due to the deficits listed below (see PT Problem List).  Pt will benefit from skilled PT to increase their independence and safety with mobility to allow discharge to the venue listed below.  Pt reports dizziness/lightheadedness which comes and goes usually always upon waking with occasional reports of spinning however not consistent.  Pt denies tinnitus and hearing loss.  Pt reports lightheadedness became worse today during ambulation and utilized RW for improving safety and stability.  Pt's LE's are grossly WFL and at least 4/5 throughout per MMT however pt reports generalized weakness.  Pt also reports recent numbness and dull sensation on L side worse in hand and foot however uncertain if this started prior to dizziness which caused her admission.  Will continue to see pt in acute care and recommend f/u HHPT and RW upon d/c.  Pt reports family available to assist upon d/c for safety.     Follow Up Recommendations Home health PT    Equipment Recommendations  Rolling walker with 5" wheels    Recommendations for Other Services       Precautions / Restrictions Precautions Precautions: Fall      Mobility  Bed Mobility Overal bed mobility: Modified Independent                Transfers Overall transfer level: Needs assistance Equipment used: 1 person hand held assist;Rolling walker (2 wheeled) Transfers: Sit to/from [facility] Sit to Stand: Min assist Stand pivot transfers: Min guard       General transfer comment: assist to stand initially  with 1 HHA, used RW for transfers thereafter and verbal cues for hand placement  Ambulation/Gait Ambulation/Gait assistance: Min assist;Min guard Ambulation Distance (Feet): 100 Feet Assistive device: Rolling walker (2 wheeled) Gait Pattern/deviations: Step-through pattern;Decreased stride length;Trunk flexed Gait velocity: decr   General Gait Details: verbal cues for safe use of RW including RW distance, pt reports 8/10 dizziness during ambulation however no LOB observed with RW, educated to focus on stationary object which pt reported decreases dizziness sensation  Stairs            Wheelchair Mobility    Modified Rankin (Stroke Patients Only)       Balance                                             Pertinent Vitals/Pain No c/o pain    Home Living Family/patient expects to be discharged to:: Private residence Living Arrangements: Children;Other relatives Available Help at Discharge: Family Type of Home: Apartment Home Access: Elevator     Home Layout: One level Home Equipment: Cane - single point      Prior Function Level of Independence: Independent               Hand Dominance        Extremity/Trunk Assessment   Upper Extremity Assessment: Defer to OT evaluation (reports numbness/dull sensation L UE especially hand)           Lower Extremity  Assessment: LLE deficits/detail   LLE Deficits / Details: grossly WFL however pt reports numbness/dull sensation L LE especially in foot     Communication   Communication: No difficulties  Cognition Arousal/Alertness: Awake/alert Behavior During Therapy: WFL for tasks assessed/performed Overall Cognitive Status: Within Functional Limits for tasks assessed                      General Comments      Exercises        Assessment/Plan    PT Assessment Patient needs continued PT services  PT Diagnosis Difficulty walking   PT Problem List Decreased  strength;Decreased mobility;Decreased activity tolerance;Decreased knowledge of use of DME;Decreased balance  PT Treatment Interventions Gait training;Functional mobility training;Patient/family education;Therapeutic activities;Therapeutic exercise;DME instruction;Balance training   PT Goals (Current goals can be found in the Care Plan section) Acute Rehab PT Goals Patient Stated Goal: decrease dizziness, return to PLOF PT Goal Formulation: With patient Time For Goal Achievement: 01/29/14 Potential to Achieve Goals: Good    Frequency Min 3X/week   Barriers to discharge        Co-evaluation               End of Session Equipment Utilized During Treatment: Gait belt Activity Tolerance: Patient tolerated treatment well Patient left: in bed;with call bell/phone within reach;with family/visitor present Nurse Communication: Mobility status         Time: [id number] PT Time Calculation (min): 20 min   Charges:   PT Evaluation $Initial PT Evaluation Tier I: 1 Procedure PT Treatments $Gait Training: 8-22 mins   PT G Codes:          Kathleen [name],Kathleen [name] 01/22/2014, 12:42 PM Carmelia Bake, PT, DPT 01/22/2014 Pager: [phone]

## 2014-01-22 NOTE — Progress Notes (Addendum)
[facility] PROGRESS NOTE  Kathleen [name] [id number] DOB: 1945/08/29 DOA: 01/21/2014 PCP: Benito Mccreedy, MD  Brief narrative 67 year old obese female with a history of hypertension, diabetes mellitus, chronic kidney disease (no baseline renal fn known) presented with one-day history of vertigo symptoms.  Assessment/Plan: Vertigo Symptoms likely in the setting of benign positional vertigo. CT head and MRI being negative for stroke. MRI brain shows a 3.5 x 2.8x1 centimeter lesion over her left parietal area suggestive of meningioma. No surrounding edema or midline shift. This is unlikely the cause of her symptoms and is likely an incidental finding. -Carotid ultrasound without significant stenosis. Discussed with neurology consult over the phone. Given isolated sx of vertigo and no mass effect or midline shift on MRI and no other associated neurological symptoms, no meningioma is likely an incidental finding . Plan for outpatient MRI with contrast in 6 months. -I will start her on Meclizine 12.5 mg tid prn for her symptoms.  Cardiomyopathy  patient on presentation had elevated proBNP. No signs of volume overload noted. A 2-D echo was done part of workup and showed significantly reduced EF of 25-30% with grade 1 diastolic dysfunction. -Patient does not appear to be in acute failure. Continue aspirin, metoprolol, Avapro and daily Lasix. Cardiomyopathy likely in the setting fo uncontrolled HTN, ? Ischemia. Serial troponin negative. Will ask for cardiology eval. Patient will need stress test at some point.  -Add statin for hyperlipidemia  Uncontrolled HTN Continue current blood pressure medications. Blood pressure stable  Diabetes mellitus Continue Levemir and sliding scale insulin. A1c of 7.2  CKD Baseline creatinine not known. Cr of 2.05 today. Reports following with Dr Florene Glen . Will obtain recent renal fn from her PCP. continue avapro  Tobacco use Counseled on  cessation   Code Status: Full code Family Communication:  son at bedside  Disposition Plan: Home once symptoms improved   Consultants:  None  Procedures:  2-D echo  Antibiotics:  None  HPI/Subjective: Patient seen and examined this morning. Continues to have vertigo on sitting up and ambulating.  Objective: Filed Vitals:   01/22/14 1405  BP: 135/59  Pulse: 78  Temp: 98.3 F (36.8 C)  Resp: 19    Intake/Output Summary (Last 24 hours) at 01/22/14 1736 Last data filed at 01/22/14 1718  Gross per 24 hour  Intake    480 ml  Output   1250 ml  Net   -770 ml   Filed Weights   01/21/14 1451 01/22/14 0418  Weight: 90.8 kg (200 lb 2.8 oz) 90.5 kg (199 lb 8.3 oz)    Exam:   General:  Elderly obese female in no acute distress  HEENT: No pallor, moist oral mucosa, no JVD, no nystagmus  Cardiovascular: Normal S1-S2, no murmurs rub or gallop  Respiratory: Clear bilaterally  Abdomen: Soft, nontender, nondistended, bowel sounds present  Musculoskeletal: Warm, no edema  CNS: AAO x3, no focal deficit  Data Reviewed: Basic Metabolic Panel:  Recent Labs Lab 01/21/14 1020 01/21/14 1025 01/22/14 0453  NA 139 137 142  K 4.6 6.7* 4.5  CL 102 105 105  CO2 25  --  24  GLUCOSE 164* 166* 76  BUN 39* 53* 40*  CREATININE 1.86* 2.00* 2.05*  CALCIUM 8.5  --  8.6   Liver Function Tests: No results found for this basename: AST, ALT, ALKPHOS, BILITOT, PROT, ALBUMIN,  in the last 168 hours No results found for this basename: LIPASE, AMYLASE,  in the last 168 hours No results found for this  basename: AMMONIA,  in the last 168 hours CBC:  Recent Labs Lab 01/21/14 1020 01/21/14 1025  WBC 4.5  --   HGB 11.5* 13.3  HCT 36.4 39.0  MCV 89.2  --   PLT 249  --    Cardiac Enzymes:  Recent Labs Lab 01/21/14 1020 01/21/14 1732 01/21/14 2305 01/22/14 0500  TROPONINI <0.30 <0.30 <0.30 <0.30   BNP (last 3 results)  Recent Labs  01/21/14 1020 01/21/14 1732   PROBNP 3351.0* 4302.0*   CBG:  Recent Labs Lab 01/22/14 0417 01/22/14 0730 01/22/14 0827 01/22/14 1113 01/22/14 1638  GLUCAP 100* 52* 107* 124* 180*    No results found for this or any previous visit (from the past 240 hour(s)).   Studies: Ct Head Wo Contrast  01/21/2014   CLINICAL DATA:  Dizziness with nausea.  Possible dehydration.  EXAM: CT HEAD WITHOUT CONTRAST  TECHNIQUE: Contiguous axial images were obtained from the base of the skull through the vertex without intravenous contrast.  COMPARISON:  None.  FINDINGS: There is no evidence of acute intracranial hemorrhage, mass lesion, brain edema or extra-axial fluid collection. The ventricles and subarachnoid spaces are appropriately sized for age. There is no CT evidence of acute cortical infarction. Minimal fat density is noted along the interhemispheric fissure.  The visualized paranasal sinuses, mastoid air cells and middle ears are clear. The calvarium is intact.  IMPRESSION: Unremarkable noncontrast head CT.   Electronically Signed   By: Camie Patience M.D.   On: 01/21/2014 10:33   Mr Jodene Nam Head Wo Contrast  01/21/2014   CLINICAL DATA:  Weakness and dizziness. Not able to ambulate. Diabetic hypertensive patient with renal disorder.  EXAM: MRI HEAD WITHOUT CONTRAST  MRA HEAD WITHOUT CONTRAST  TECHNIQUE: Multiplanar, multiecho pulse sequences of the brain and surrounding structures were obtained without intravenous contrast. Angiographic images of the head were obtained using MRA technique without contrast.  COMPARISON:  01/21/2014 head CT.  No comparison brain MR.  FINDINGS: MRI HEAD FINDINGS  No acute infarct.  Left parietal region extra-axial lenticular shaped 3.5 x 2.8 x 1 cm lesion has an appearance suggestive of a meningioma. No evidence to suggest this represents a posttraumatic blood collection. When this is evaluated on follow-up imaging, contrast enhanced imaging may be considered.  Mild small vessel disease type changes.  No  hydrocephalus.  Major intracranial vascular structures are patent.  Exophthalmos.  C3-4 bulge with mild spinal stenosis.  Partially empty sella which does not appear to be extended.  Cervical medullary junction unremarkable.  MRA HEAD FINDINGS  Mild narrowing supraclinoid segment of the internal carotid artery more notable on the left.  High grade focal stenosis A1 segment left anterior cerebral artery.  Mild narrowing A1 segment right anterior cerebral artery.  Mild to moderate narrowing distal M1 segment left middle cerebral artery.  Moderate to marked narrowing distal M1 segment right middle cerebral artery/right middle cerebral artery bifurcation.  Middle cerebral artery mild to moderate branch vessel irregularity bilaterally.  A2 segment anterior cerebral artery narrowing and irregularity greater on the right.  Ectatic vertebral arteries with mild to slightly moderate narrowing.  Full extent of the posterior inferior cerebellar arteries not imaged.  Mild to moderate narrowing of portions of the ectatic basilar artery.  Anterior circulation contributes to the posterior cerebral artery supply and therefore subsequent small vertebrobasilar system.  Nonvisualized anterior inferior cerebellar arteries and left superior cerebellar artery with narrowed right superior cerebral artery.  Moderate to marked regions of narrowing involving the portions  of the posterior cerebral artery bilaterally.  No aneurysm detected on this slightly motion degraded exam.  IMPRESSION: MRI HEAD:  No acute infarct.  Left parietal region extra-axial lenticular shaped 3.5 x 2.8 x 1 cm lesion has an appearance suggestive of a meningioma. No evidence to suggest this represents a posttraumatic blood collection. When this is evaluated on follow-up imaging, contrast enhanced imaging may be considered.  Mild small vessel disease type changes.  Exophthalmos.  C3-4 bulge with mild spinal stenosis.  MRA HEAD:  Exam is motion degraded which may  accentuate above described intracranial atherosclerotic type changes.   Electronically Signed   By: Chauncey Cruel M.D.   On: 01/21/2014 17:14   Mr Brain Wo Contrast  01/21/2014   CLINICAL DATA:  Weakness and dizziness. Not able to ambulate. Diabetic hypertensive patient with renal disorder.  EXAM: MRI HEAD WITHOUT CONTRAST  MRA HEAD WITHOUT CONTRAST  TECHNIQUE: Multiplanar, multiecho pulse sequences of the brain and surrounding structures were obtained without intravenous contrast. Angiographic images of the head were obtained using MRA technique without contrast.  COMPARISON:  01/21/2014 head CT.  No comparison brain MR.  FINDINGS: MRI HEAD FINDINGS  No acute infarct.  Left parietal region extra-axial lenticular shaped 3.5 x 2.8 x 1 cm lesion has an appearance suggestive of a meningioma. No evidence to suggest this represents a posttraumatic blood collection. When this is evaluated on follow-up imaging, contrast enhanced imaging may be considered.  Mild small vessel disease type changes.  No hydrocephalus.  Major intracranial vascular structures are patent.  Exophthalmos.  C3-4 bulge with mild spinal stenosis.  Partially empty sella which does not appear to be extended.  Cervical medullary junction unremarkable.  MRA HEAD FINDINGS  Mild narrowing supraclinoid segment of the internal carotid artery more notable on the left.  High grade focal stenosis A1 segment left anterior cerebral artery.  Mild narrowing A1 segment right anterior cerebral artery.  Mild to moderate narrowing distal M1 segment left middle cerebral artery.  Moderate to marked narrowing distal M1 segment right middle cerebral artery/right middle cerebral artery bifurcation.  Middle cerebral artery mild to moderate branch vessel irregularity bilaterally.  A2 segment anterior cerebral artery narrowing and irregularity greater on the right.  Ectatic vertebral arteries with mild to slightly moderate narrowing.  Full extent of the posterior inferior  cerebellar arteries not imaged.  Mild to moderate narrowing of portions of the ectatic basilar artery.  Anterior circulation contributes to the posterior cerebral artery supply and therefore subsequent small vertebrobasilar system.  Nonvisualized anterior inferior cerebellar arteries and left superior cerebellar artery with narrowed right superior cerebral artery.  Moderate to marked regions of narrowing involving the portions of the posterior cerebral artery bilaterally.  No aneurysm detected on this slightly motion degraded exam.  IMPRESSION: MRI HEAD:  No acute infarct.  Left parietal region extra-axial lenticular shaped 3.5 x 2.8 x 1 cm lesion has an appearance suggestive of a meningioma. No evidence to suggest this represents a posttraumatic blood collection. When this is evaluated on follow-up imaging, contrast enhanced imaging may be considered.  Mild small vessel disease type changes.  Exophthalmos.  C3-4 bulge with mild spinal stenosis.  MRA HEAD:  Exam is motion degraded which may accentuate above described intracranial atherosclerotic type changes.   Electronically Signed   By: Chauncey Cruel M.D.   On: 01/21/2014 17:14    Scheduled Meds: . aspirin EC  81 mg Oral Daily  . enoxaparin (LOVENOX) injection  40 mg Subcutaneous Q24H  .  furosemide  40 mg Oral Daily  . insulin aspart  0-9 Units Subcutaneous 6 times per day  . insulin detemir  25 Units Subcutaneous QHS  . irbesartan  300 mg Oral Daily  . metoprolol tartrate  25 mg Oral BID  . nicotine  21 mg Transdermal Daily   Continuous Infusions:     Time spent: 25 minutes    DHUNGEL, NISHANT  [facility] Pager [phone] If 7PM-7AM, please contact night-coverage at [email or web address], password [facility] 01/22/2014, 5:36 PM  LOS: 1 day

## 2014-01-22 NOTE — Progress Notes (Signed)
Inpatient Diabetes Program Recommendations  AACE/ADA: New Consensus Statement on Inpatient Glycemic Control (2013)  Target Ranges:  Prepandial:   less than 140 mg/dL      Peak postprandial:   less than 180 mg/dL (1-2 hours)      Critically ill patients:  140 - 180 mg/dL   Inpatient Diabetes Program Recommendations Correction (SSI): change Novolog to TID + HS scale per Glycemic Control Order set Thank you  Raoul Pitch BSN, RN,CDE Inpatient Diabetes Coordinator [phone] (team pager)

## 2014-01-22 NOTE — Progress Notes (Signed)
Hypoglycemic Event  CBG: 52  Treatment: 8 oz orange juice  Symptoms: none  Follow-up CBG: Time:0827 CBG Result:107  Possible Reasons for Event: did not eat a lot of dinner last night  Comments/MD notified: Dr. Clementeen Graham notified    Vaughan Basta, Marchelle Folks  Remember to initiate Hypoglycemia Order Set & complete

## 2014-01-22 NOTE — Progress Notes (Signed)
Bilateral carotid artery duplex:  1-39% ICA stenosis.  Vertebral artery flow is antegrade.     

## 2014-01-22 NOTE — Progress Notes (Signed)
  Echocardiogram 2D Echocardiogram has been performed.  Kathleen [name] 01/22/2014, 12:50 PM

## 2014-01-22 NOTE — Evaluation (Signed)
Occupational Therapy Evaluation Patient Details Name: Kathleen [name] MRN: [id number] DOB: 04-28-1946 Today's Date: 01/22/2014    History of Present Illness Pt is a 68 year old female with PMHx of HTN, DM, and renal disorder admitted 6/16 for dizziness/lightheadedness and stroke/TIA workup.  MRI negative for acute infarct.   Clinical Impression   Pt ist at min guard A level with ADLs and ADL mobility due to decreased safety/fall risk from dizziness. Pt require no physical assist with ADLs. No further acute OT services indicated at this time, pt to continue with acute PT services for functional mobility safety    Follow Up Recommendations  No OT follow up    Equipment Recommendations  None recommended by OT    Recommendations for Other Services       Precautions / Restrictions Precautions Precautions: Fall Restrictions Weight Bearing Restrictions: No      Mobility Bed Mobility Overal bed mobility: Modified Independent             General bed mobility comments: pt up in recliner upon entering room  Transfers Overall transfer level: Needs assistance Equipment used: 1 person hand held assist;Rolling walker (2 wheeled) Transfers: Sit to/from [facility] Sit to Stand: Min guard Stand pivot transfers: Min guard       General transfer comment: assist to stand initially with 1 HHA, used RW for transfers thereafter and verbal cues for hand placement    Balance Overall balance assessment: Needs assistance Sitting-balance support: No upper extremity supported;Feet supported Sitting balance-Leahy Scale: Normal     Standing balance support: Single extremity supported;No upper extremity supported;During functional activity Standing balance-Leahy Scale: Fair                              ADL Overall ADL's : Needs assistance/impaired Eating/Feeding: Independent;Sitting   Grooming: Wash/dry hands;Wash/dry face;Supervision/safety   Upper Body  Bathing: Set up;Sitting   Lower Body Bathing: Supervison/ safety;Min guard   Upper Body Dressing : Set up;Sitting   Lower Body Dressing: Supervision/safety;Min guard   Toilet Transfer: Min guard   Toileting- Clothing Manipulation and Hygiene: Min guard;Sit to/from stand   Tub/ Shower Transfer: Min guard;Grab bars;3 in 1;Ambulation   Functional mobility during ADLs: Min guard General ADL Comments: verbal cues for safe hand placement. Min guard assist wiht ADLs during sit - stand/standing due to dizziness, no physical assist     Vision  wears reading glasses                   Perception Perception Perception Tested?: No   Praxis Praxis Praxis tested?: Not tested    Pertinent Vitals/Pain No c/o pain, VSS     Hand Dominance Right   Extremity/Trunk Assessment Upper Extremity Assessment Upper Extremity Assessment: Overall WFL for tasks assessed   Lower Extremity Assessment Lower Extremity Assessment: Defer to PT evaluation LLE Deficits / Details: grossly WFL however pt reports numbness/dull sensation L LE especially in foot LLE Sensation: decreased light touch   Cervical / Trunk Assessment Cervical / Trunk Assessment: Normal   Communication Communication Communication: No difficulties   Cognition Arousal/Alertness: Awake/alert Behavior During Therapy: WFL for tasks assessed/performed Overall Cognitive Status: Within Functional Limits for tasks assessed                     General Comments   Pt very pleasant and cooperative  Home Living Family/patient expects to be discharged to:: Private residence Living Arrangements: Children;Other relatives Available Help at Discharge: Family Type of Home: Apartment Home Access: Elevator     Home Layout: One level     Bathroom Shower/Tub: Teacher, early years/pre: Standard     Home Equipment: Cane - single point          Prior Functioning/Environment Level of  Independence: Independent        Comments: pt reports that she is independent with ADLs, cooking, home mgt. Does not drive    OT Diagnosis:     OT Problem List:     OT Treatment/Interventions:      OT Goals(Current goals can be found in the care plan section) Acute Rehab OT Goals Patient Stated Goal: stop getting dizzy and go home OT Goal Formulation: With patient  OT Frequency:     Barriers to D/C:    none                     End of Session Equipment Utilized During Treatment: Rolling walker;Other (comment) (3 in 1 over toilet)  Activity Tolerance: Patient tolerated treatment well Patient left: in chair;with call bell/phone within reach   Time: 1325-1349 OT Time Calculation (min): 24 min Charges:  OT General Charges $OT Visit: 1 Procedure OT Evaluation $Initial OT Evaluation Tier I: 1 Procedure OT Treatments $Therapeutic Activity: 8-22 mins G-Codes:    Britt Bottom 01/22/2014, 2:35 PM

## 2014-01-23 ENCOUNTER — Encounter (HOSPITAL_COMMUNITY): Payer: Self-pay | Admitting: Physician Assistant

## 2014-01-23 DIAGNOSIS — I429 Cardiomyopathy, unspecified: Secondary | ICD-10-CM

## 2014-01-23 DIAGNOSIS — E785 Hyperlipidemia, unspecified: Secondary | ICD-10-CM

## 2014-01-23 LAB — BASIC METABOLIC PANEL
BUN: 46 mg/dL — ABNORMAL HIGH (ref 6–23)
CALCIUM: 8.3 mg/dL — AB (ref 8.4–10.5)
CO2: 26 mEq/L (ref 19–32)
CREATININE: 2.13 mg/dL — AB (ref 0.50–1.10)
Chloride: 102 mEq/L (ref 96–112)
GFR calc Af Amer: 26 mL/min — ABNORMAL LOW (ref >90)
GFR, EST NON AFRICAN AMERICAN: 23 mL/min — AB (ref >90)
GLUCOSE: 189 mg/dL — AB (ref 70–99)
Potassium: 4.6 mEq/L (ref 3.7–5.3)
Sodium: 139 mEq/L (ref 137–147)

## 2014-01-23 LAB — POCT I-STAT, CHEM 8
BUN: 53 mg/dL — ABNORMAL HIGH (ref 6–23)
CALCIUM ION: 0.99 mmol/L — AB (ref 1.13–1.30)
CREATININE: 2 mg/dL — AB (ref 0.50–1.10)
Chloride: 105 mEq/L (ref 96–112)
GLUCOSE: 166 mg/dL — AB (ref 70–99)
HCT: 39 % (ref 36.0–46.0)
HEMOGLOBIN: 13.3 g/dL (ref 12.0–15.0)
Potassium: 6.7 mEq/L (ref 3.7–5.3)
Sodium: 137 mEq/L (ref 137–147)
TCO2: 24 mmol/L (ref 0–100)

## 2014-01-23 LAB — URINE CULTURE

## 2014-01-23 LAB — GLUCOSE, CAPILLARY
Glucose-Capillary: 54 mg/dL — ABNORMAL LOW (ref 70–99)
Glucose-Capillary: 93 mg/dL (ref 70–99)
Glucose-Capillary: 171 mg/dL — ABNORMAL HIGH (ref 70–99)
Glucose-Capillary: 161 mg/dL — ABNORMAL HIGH (ref 70–99)
Glucose-Capillary: 118 mg/dL — ABNORMAL HIGH (ref 70–99)

## 2014-01-23 MED ORDER — INSULIN DETEMIR 100 UNIT/ML ~~LOC~~ SOLN
20.0000 [IU] | Freq: Every day | SUBCUTANEOUS | Status: DC
Start: 2014-01-23 — End: 2014-01-26
  Administered 2014-01-23 – 2014-01-25 (×3): 20 [IU] via SUBCUTANEOUS
  Filled 2014-01-23 (×4): qty 0.2

## 2014-01-23 MED ORDER — IRBESARTAN 150 MG PO TABS
150.0000 mg | ORAL_TABLET | Freq: Every day | ORAL | Status: DC
  Administered 2014-01-24: 150 mg via ORAL
  Filled 2014-01-23 (×2): qty 1

## 2014-01-23 MED ORDER — MECLIZINE HCL 25 MG PO TABS
25.0000 mg | ORAL_TABLET | Freq: Three times a day (TID) | ORAL | Status: DC
  Administered 2014-01-23 – 2014-01-26 (×10): 25 mg via ORAL
  Filled 2014-01-23 (×13): qty 1

## 2014-01-23 NOTE — Progress Notes (Signed)
Results for EMMAH, BUCHERT (MRN [id number]) as of 01/23/2014 12:03  Ref. Range 01/22/2014 16:38 01/22/2014 21:30 01/23/2014 07:25 01/23/2014 07:57 01/23/2014 11:21  Glucose-Capillary Latest Range: 70-99 mg/dL 180 (H) 176 (H) 54 (L) 93 171 (H)   CBG this AM was 54 mg/dl.  Recommend decreasing Novolog correction scale to SENSITIVE TID & HS.  If CBGs continue to be less than 70 mg/dl, may need to decrease Levemir to 20 units daily.  Will continue to follow while in hospital.  Harvel Ricks RN BSN CDE

## 2014-01-23 NOTE — Progress Notes (Signed)
Physical Therapy Treatment Patient Details Name: Kathleen [name] MRN: [id number] DOB: 11/18/1945 Today's Date: 02-19-2014    History of Present Illness Pt is a 68 year old female with PMHx of HTN, DM, and renal disorder admitted 6/16 for dizziness/lightheadedness and stroke/TIA workup.  MRI negative for acute infarct.    PT Comments    Pt reports 8/10 dizziness/lightheadedness at rest and during ambulation today.  Pt reported feeling shaky after gait which did not resolve after a few minutes rest so asked NT to check CBG: 171.  Pt agreeable to use RW and have assist during mobility upon d/c especially if still feeling dizzy.   Follow Up Recommendations  Home health PT     Equipment Recommendations  Rolling walker with 5" wheels    Recommendations for Other Services       Precautions / Restrictions Precautions Precautions: Fall    Mobility  Bed Mobility Overal bed mobility: Modified Independent             General bed mobility comments: increased time  Transfers Overall transfer level: Needs assistance Equipment used: Rolling walker (2 wheeled) Transfers: Sit to/from Stand Sit to Stand: Min guard         General transfer comment: assist to rise and steady, verbal cues for hand placement  Ambulation/Gait Ambulation/Gait assistance: Min guard Ambulation Distance (Feet): 80 Feet Assistive device: Rolling walker (2 wheeled) Gait Pattern/deviations: Step-through pattern;Trunk flexed;Decreased stride length Gait velocity: decr   General Gait Details: much slower velocity today compared to yesterday, verbal cues for RW placement and posture, also needed frequent cues to focus on stationary objects to assist with dizziness, pt reports 8/10 lightheadedness   Stairs            Wheelchair Mobility    Modified Rankin (Stroke Patients Only)       Balance                                    Cognition Arousal/Alertness: Awake/alert Behavior  During Therapy: WFL for tasks assessed/performed Overall Cognitive Status: Within Functional Limits for tasks assessed                      Exercises General Exercises - Lower Extremity Long Arc Quad: AROM;Both;15 reps;Seated Heel Slides: AROM;Both;10 reps;Supine Hip Flexion/Marching: AROM;Both;10 reps;Seated Toe Raises: Seated;AROM;Both;10 reps Heel Raises: Seated;AROM;Both;10 reps    General Comments        Pertinent Vitals/Pain No pain reported    Home Living                      Prior Function            PT Goals (current goals can now be found in the care plan section) Progress towards PT goals: Progressing toward goals    Frequency  Min 3X/week    PT Plan Current plan remains appropriate    Co-evaluation             End of Session   Activity Tolerance: Patient tolerated treatment well Patient left: in bed;with call bell/phone within reach;with bed alarm set     Time: [id number] PT Time Calculation (min): 26 min  Charges:  $Gait Training: 23-37 mins                    G Codes:      LEMYRE,KATHrine E 19-Feb-2014, 12:09 PM  Carmelia Bake, PT, DPT 01/23/2014 Pager: [phone]

## 2014-01-23 NOTE — Progress Notes (Signed)
G- Codes for PT evaluation   01/22/14 1242  PT Time Calculation  PT Start Time 1153  PT Stop Time 1213  PT Time Calculation (min) 20 min  PT G-Codes **NOT FOR INPATIENT CLASS**  Functional Assessment Tool Used clinical judgement  Functional Limitation Mobility: Walking and moving around  Mobility: Walking and Moving Around Current Status [id number]) CJ  Mobility: Walking and Moving Around Goal Status [id number]) CI  PT General Charges  $$ ACUTE PT VISIT 1 Procedure  PT Evaluation  $Initial PT Evaluation Tier I 1 Procedure  PT Treatments  $Gait Training 8-22 mins   Carmelia Bake, PT, DPT 01/23/2014 Pager: [phone]

## 2014-01-23 NOTE — Progress Notes (Signed)
UR Completed Brenda Graves-Bigelow, RN,BSN [phone]  

## 2014-01-23 NOTE — Progress Notes (Signed)
Hypoglycemic Event  CBG: 54  Treatment: 15 GM carbohydrate snack  Symptoms: None  Follow-up CBG: Time:0757 CBG Result:93  Possible Reasons for Event: Unknown  Comments/MD notified:Dr. Dhungel    Kathleen [name], Kathleen [name]  Remember to initiate Hypoglycemia Order Set & complete

## 2014-01-23 NOTE — Progress Notes (Addendum)
[facility] PROGRESS NOTE  Kathleen [name] [id number] DOB: June 25, 1946 DOA: 01/21/2014 PCP: Kathleen Mccreedy, MD  Brief narrative 68 year old obese female with a history of hypertension, diabetes mellitus, chronic kidney disease (no baseline renal fn known) presented with one-day history of vertigo symptoms.  Assessment/Plan: Vertigo Symptoms likely in the setting of benign positional vertigo. CT head and MRI being negative for stroke. MRI brain shows a 3.5 x 2.8x1 centimeter lesion over her left parietal area suggestive of meningioma. No surrounding edema or midline shift. This is unlikely the cause of her symptoms and is likely an incidental finding. -Carotid ultrasound without significant stenosis. Discussed with neurology consult over the phone. Given isolated sx of vertigo and no mass effect or midline shift on MRI and no other associated neurological symptoms, no meningioma is likely an incidental finding . Plan for outpatient MRI with contrast in 6 months. -started on meclizine with good response. on scheduled meclizine now.   Cardiomyopathy  patient on presentation had elevated proBNP. No signs of volume overload noted. A 2-D echo was done part of workup and showed significantly reduced EF of 25-30% with grade 1 diastolic dysfunction. -Patient does not appear to be in acute failure. Continue aspirin, metoprolol, Avapro and daily Lasix. Cardiomyopathy likely in the setting of ischemia and has multiple risk factors including uncontrolled HTN, DM, obesity CKD and smoking. . Serial troponin negative.  Cardiology consulted. May need inpt stress -Add statin for hyperlipidemia  Uncontrolled HTN Continue current blood pressure medications. Blood pressure stable  Diabetes mellitus A1c of 7.2. Hypoglycemic episode this am . Reduce levemir dose to 20 units saikly sliding scale insulin.   CKD  Cr of 2.10. Reduce avapro dose. D/w PCP, pts creatinine in dec 2014 was 1.76. Reports  following with Kathleen [name].   Tobacco use Counseled on cessation   Code Status: Full code Family Communication:  son at bedside  Disposition Plan: Home post cardiac w/up   Consultants:  None  Procedures:  2-D echo  Antibiotics:  None  HPI/Subjective: Patient seen and examined this morning. Vertigo improved  Objective: Filed Vitals:   01/23/14 1529  BP: 140/61  Pulse: 79  Temp: 98.4 F (36.9 C)  Resp: 20    Intake/Output Summary (Last 24 hours) at 01/23/14 1557 Last data filed at 01/23/14 1530  Gross per 24 hour  Intake    480 ml  Output    600 ml  Net   -120 ml   Filed Weights   01/21/14 1451 01/22/14 0418 01/23/14 0544  Weight: 90.8 kg (200 lb 2.8 oz) 90.5 kg (199 lb 8.3 oz) 90.4 kg (199 lb 4.7 oz)    Exam:   General:  Elderly obese female in no acute distress  HEENT: No pallor, moist oral mucosa, no JVD, no nystagmus  Cardiovascular: Normal S1-S2, no murmurs rub or gallop  Respiratory: Clear bilaterally  Abdomen: Soft, nontender, nondistended, bowel sounds present  Musculoskeletal: Warm, no edema  CNS: AAO x3, no focal deficit  Data Reviewed: Basic Metabolic Panel:  Recent Labs Lab 01/21/14 1020 01/21/14 1025 01/22/14 0453 01/23/14 1045  NA 139 137  137 142 139  K 4.6 6.7*  6.7* 4.5 4.6  CL 102 105  105 105 102  CO2 25  --  24 26  GLUCOSE 164* 166*  166* 76 189*  BUN 39* 53*  53* 40* 46*  CREATININE 1.86* 2.00*  2.00* 2.05* 2.13*  CALCIUM 8.5  --  8.6 8.3*   Liver Function Tests: No results found  for this basename: AST, ALT, ALKPHOS, BILITOT, PROT, ALBUMIN,  in the last 168 hours No results found for this basename: LIPASE, AMYLASE,  in the last 168 hours No results found for this basename: AMMONIA,  in the last 168 hours CBC:  Recent Labs Lab 01/21/14 1020 01/21/14 1025  WBC 4.5  --   HGB 11.5* 13.3  13.3  HCT 36.4 39.0  39.0  MCV 89.2  --   PLT 249  --    Cardiac Enzymes:  Recent Labs Lab 01/21/14 1020  01/21/14 1732 01/21/14 2305 01/22/14 0500  TROPONINI <0.30 <0.30 <0.30 <0.30   BNP (last 3 results)  Recent Labs  01/21/14 1020 01/21/14 1732  PROBNP 3351.0* 4302.0*   CBG:  Recent Labs Lab 01/22/14 1638 01/22/14 2130 01/23/14 0725 01/23/14 0757 01/23/14 1121  GLUCAP 180* 176* 54* 93 171*    Recent Results (from the past 240 hour(s))  CULTURE, BLOOD (ROUTINE X 2)     Status: None   Collection Time    01/21/14  4:34 PM      Result Value Ref Range Status   Specimen Description BLOOD RIGHT HAND   Final   Special Requests BOTTLES DRAWN AEROBIC ONLY 3CC   Final   Culture  Setup Time     Final   Value: 01/22/2014 01:49     Performed at [facility]   Culture     Final   Value:        BLOOD CULTURE RECEIVED NO GROWTH TO DATE CULTURE WILL BE HELD FOR 5 DAYS BEFORE ISSUING A FINAL NEGATIVE REPORT     Performed at [facility]   Report Status PENDING   Incomplete  CULTURE, BLOOD (ROUTINE X 2)     Status: None   Collection Time    01/21/14  5:15 PM      Result Value Ref Range Status   Specimen Description BLOOD LEFT ARM   Final   Special Requests BOTTLES DRAWN AEROBIC AND ANAEROBIC 8CC   Final   Culture  Setup Time     Final   Value: 01/22/2014 01:50     Performed at [facility]   Culture     Final   Value:        BLOOD CULTURE RECEIVED NO GROWTH TO DATE CULTURE WILL BE HELD FOR 5 DAYS BEFORE ISSUING A FINAL NEGATIVE REPORT     Performed at [facility]   Report Status PENDING   Incomplete  URINE CULTURE     Status: None   Collection Time    01/22/14  5:20 AM      Result Value Ref Range Status   Specimen Description URINE, RANDOM   Final   Special Requests NONE   Final   Culture  Setup Time     Final   Value: 01/22/2014 08:40     Performed at [facility]     Final   Value: >=100,000 COLONIES/ML     Performed at [facility]   Culture     Final   Value: Multiple bacterial morphotypes present, none  predominant. Suggest appropriate recollection if clinically indicated.     Performed at [facility]   Report Status 01/23/2014 FINAL   Final     Studies: Mr Kathleen [name] Head Wo Contrast  01/21/2014   CLINICAL DATA:  Weakness and dizziness. Not able to ambulate. Diabetic hypertensive patient with renal disorder.  EXAM: MRI HEAD WITHOUT CONTRAST  MRA HEAD WITHOUT CONTRAST  TECHNIQUE: Multiplanar, multiecho pulse sequences of the brain and surrounding structures were obtained without intravenous contrast. Angiographic images of the head were obtained using MRA technique without contrast.  COMPARISON:  01/21/2014 head CT.  No comparison brain MR.  FINDINGS: MRI HEAD FINDINGS  No acute infarct.  Left parietal region extra-axial lenticular shaped 3.5 x 2.8 x 1 cm lesion has an appearance suggestive of a meningioma. No evidence to suggest this represents a posttraumatic blood collection. When this is evaluated on follow-up imaging, contrast enhanced imaging may be considered.  Mild small vessel disease type changes.  No hydrocephalus.  Major intracranial vascular structures are patent.  Exophthalmos.  C3-4 bulge with mild spinal stenosis.  Partially empty sella which does not appear to be extended.  Cervical medullary junction unremarkable.  MRA HEAD FINDINGS  Mild narrowing supraclinoid segment of the internal carotid artery more notable on the left.  High grade focal stenosis A1 segment left anterior cerebral artery.  Mild narrowing A1 segment right anterior cerebral artery.  Mild to moderate narrowing distal M1 segment left middle cerebral artery.  Moderate to marked narrowing distal M1 segment right middle cerebral artery/right middle cerebral artery bifurcation.  Middle cerebral artery mild to moderate branch vessel irregularity bilaterally.  A2 segment anterior cerebral artery narrowing and irregularity greater on the right.  Ectatic vertebral arteries with mild to slightly moderate narrowing.  Full extent of  the posterior inferior cerebellar arteries not imaged.  Mild to moderate narrowing of portions of the ectatic basilar artery.  Anterior circulation contributes to the posterior cerebral artery supply and therefore subsequent small vertebrobasilar system.  Nonvisualized anterior inferior cerebellar arteries and left superior cerebellar artery with narrowed right superior cerebral artery.  Moderate to marked regions of narrowing involving the portions of the posterior cerebral artery bilaterally.  No aneurysm detected on this slightly motion degraded exam.  IMPRESSION: MRI HEAD:  No acute infarct.  Left parietal region extra-axial lenticular shaped 3.5 x 2.8 x 1 cm lesion has an appearance suggestive of a meningioma. No evidence to suggest this represents a posttraumatic blood collection. When this is evaluated on follow-up imaging, contrast enhanced imaging may be considered.  Mild small vessel disease type changes.  Exophthalmos.  C3-4 bulge with mild spinal stenosis.  MRA HEAD:  Exam is motion degraded which may accentuate above described intracranial atherosclerotic type changes.   Electronically Signed   By: Chauncey Cruel M.D.   On: 01/21/2014 17:14   Mr Brain Wo Contrast  01/21/2014   CLINICAL DATA:  Weakness and dizziness. Not able to ambulate. Diabetic hypertensive patient with renal disorder.  EXAM: MRI HEAD WITHOUT CONTRAST  MRA HEAD WITHOUT CONTRAST  TECHNIQUE: Multiplanar, multiecho pulse sequences of the brain and surrounding structures were obtained without intravenous contrast. Angiographic images of the head were obtained using MRA technique without contrast.  COMPARISON:  01/21/2014 head CT.  No comparison brain MR.  FINDINGS: MRI HEAD FINDINGS  No acute infarct.  Left parietal region extra-axial lenticular shaped 3.5 x 2.8 x 1 cm lesion has an appearance suggestive of a meningioma. No evidence to suggest this represents a posttraumatic blood collection. When this is evaluated on follow-up imaging,  contrast enhanced imaging may be considered.  Mild small vessel disease type changes.  No hydrocephalus.  Major intracranial vascular structures are patent.  Exophthalmos.  C3-4 bulge with mild spinal stenosis.  Partially empty sella which does not appear to be extended.  Cervical medullary junction unremarkable.  MRA HEAD  FINDINGS  Mild narrowing supraclinoid segment of the internal carotid artery more notable on the left.  High grade focal stenosis A1 segment left anterior cerebral artery.  Mild narrowing A1 segment right anterior cerebral artery.  Mild to moderate narrowing distal M1 segment left middle cerebral artery.  Moderate to marked narrowing distal M1 segment right middle cerebral artery/right middle cerebral artery bifurcation.  Middle cerebral artery mild to moderate branch vessel irregularity bilaterally.  A2 segment anterior cerebral artery narrowing and irregularity greater on the right.  Ectatic vertebral arteries with mild to slightly moderate narrowing.  Full extent of the posterior inferior cerebellar arteries not imaged.  Mild to moderate narrowing of portions of the ectatic basilar artery.  Anterior circulation contributes to the posterior cerebral artery supply and therefore subsequent small vertebrobasilar system.  Nonvisualized anterior inferior cerebellar arteries and left superior cerebellar artery with narrowed right superior cerebral artery.  Moderate to marked regions of narrowing involving the portions of the posterior cerebral artery bilaterally.  No aneurysm detected on this slightly motion degraded exam.  IMPRESSION: MRI HEAD:  No acute infarct.  Left parietal region extra-axial lenticular shaped 3.5 x 2.8 x 1 cm lesion has an appearance suggestive of a meningioma. No evidence to suggest this represents a posttraumatic blood collection. When this is evaluated on follow-up imaging, contrast enhanced imaging may be considered.  Mild small vessel disease type changes.  Exophthalmos.   C3-4 bulge with mild spinal stenosis.  MRA HEAD:  Exam is motion degraded which may accentuate above described intracranial atherosclerotic type changes.   Electronically Signed   By: Chauncey Cruel M.D.   On: 01/21/2014 17:14    Scheduled Meds: . aspirin EC  81 mg Oral Daily  . enoxaparin (LOVENOX) injection  40 mg Subcutaneous Q24H  . furosemide  40 mg Oral Daily  . insulin aspart  0-15 Units Subcutaneous TID WC  . insulin aspart  0-5 Units Subcutaneous QHS  . insulin detemir  25 Units Subcutaneous QHS  . [START ON 01/24/2014] irbesartan  150 mg Oral Daily  . meclizine  25 mg Oral TID  . metoprolol tartrate  25 mg Oral BID  . nicotine  21 mg Transdermal Daily  . simvastatin  20 mg Oral q1800   Continuous Infusions:     Time spent: 25 minutes    Sal Spratley, Champ  [facility] Pager [phone] If 7PM-7AM, please contact night-coverage at [email or web address], password [facility] 01/23/2014, 3:57 PM  LOS: 2 days

## 2014-01-23 NOTE — Consult Note (Signed)
Cardiologist: Nishan(new) Reason for Consult: Cardiomyopathy(new) Referring Physician:   [name] is an 68 y.o. female.  HPI:   The patient is a 68 yo obese female, who is not very active, with a history of DM, tobacco abuse, CKD, HTN.  She was admitted with high BP.  She reports being SOB all the time, some nausea, intermittent chest tightness, LEE which improves by morning, dizziness upon waking sometimes and room spinning.  She did have severe LEE recent which resulted in bullae formation and has several lesions on her ankles.  The patient currently denies vomiting, fever, orthopnea, dizziness, PND, congestion, abdominal pain, hematochezia, melena, claudication(but does not walk much).   Past Medical History  Diagnosis Date  . Diabetes mellitus without complication   . Renal disorder   . Hypertension     Past Surgical History  Procedure Laterality Date  . Abdominal hysterectomy    . Breast surgery    . Tonsillectomy      Family History  Problem Relation Age of Onset  . Diabetic kidney disease Mother   . Hypertension Mother   . Hypertension Father     Social History:  reports that she has been smoking Cigarettes.  She has a 7.5 pack-year smoking history. She has never used smokeless tobacco. She reports that she does not drink alcohol or use illicit drugs.  Allergies: No Known Allergies  Medications:  Scheduled Meds: . aspirin EC  81 mg Oral Daily  . enoxaparin (LOVENOX) injection  40 mg Subcutaneous Q24H  . furosemide  40 mg Oral Daily  . insulin aspart  0-15 Units Subcutaneous TID WC  . insulin aspart  0-5 Units Subcutaneous QHS  . insulin detemir  25 Units Subcutaneous QHS  . [START ON 01/24/2014] irbesartan  150 mg Oral Daily  . meclizine  25 mg Oral TID  . metoprolol tartrate  25 mg Oral BID  . nicotine  21 mg Transdermal Daily  . simvastatin  20 mg Oral q1800   Continuous Infusions:  PRN Meds:.   Results for orders placed during the hospital  encounter of 01/21/14 (from the past 48 hour(s))  TSH     Status: None   Collection Time    01/21/14  3:23 PM      Result Value Ref Range   TSH 0.661  0.350 - 4.500 uIU/mL   Comment: Performed at [facility], BLOOD (ROUTINE X 2)     Status: None   Collection Time    01/21/14  4:34 PM      Result Value Ref Range   Specimen Description BLOOD RIGHT HAND     Special Requests BOTTLES DRAWN AEROBIC ONLY 3CC     Culture  Setup Time       Value: 01/22/2014 01:49     Performed at [facility]   Culture       Value:        BLOOD CULTURE RECEIVED NO GROWTH TO DATE CULTURE WILL BE HELD FOR 5 DAYS BEFORE ISSUING A FINAL NEGATIVE REPORT     Performed at [facility]   Report Status PENDING    CULTURE, BLOOD (ROUTINE X 2)     Status: None   Collection Time    01/21/14  5:15 PM      Result Value Ref Range   Specimen Description BLOOD LEFT ARM     Special Requests BOTTLES DRAWN AEROBIC AND ANAEROBIC 8CC     Culture  Setup Time  Value: 01/22/2014 01:50     Performed at [facility]   Culture       Value:        BLOOD CULTURE RECEIVED NO GROWTH TO DATE CULTURE WILL BE HELD FOR 5 DAYS BEFORE ISSUING A FINAL NEGATIVE REPORT     Performed at [facility]   Report Status PENDING    HEMOGLOBIN A1C     Status: Abnormal   Collection Time    01/21/14  5:31 PM      Result Value Ref Range   Hemoglobin A1C 7.2 (*) <5.7 %   Comment: (NOTE)                                                                               According to the ADA Clinical Practice Recommendations for 2011, when     HbA1c is used as a screening test:      >=6.5%   Diagnostic of Diabetes Mellitus               (if abnormal result is confirmed)     5.7-6.4%   Increased risk of developing Diabetes Mellitus     References:Diagnosis and Classification of Diabetes Mellitus,Diabetes     Care,2011,34(Suppl 1):S62-S69 and Standards of Medical Care in             Diabetes -  2011,Diabetes Care,2011,34 (Suppl 1):S11-S61.   Mean Plasma Glucose 160 (*) <117 mg/dL   Comment: Performed at [facility]     Status: Abnormal   Collection Time    01/21/14  5:31 PM      Result Value Ref Range   Cholesterol 253 (*) 0 - 200 mg/dL   Triglycerides 134  <150 mg/dL   HDL 53  >39 mg/dL   Total CHOL/HDL Ratio 4.8     VLDL 27  0 - 40 mg/dL   LDL Cholesterol 173 (*) 0 - 99 mg/dL   Comment:            Total Cholesterol/HDL:CHD Risk     Coronary Heart Disease Risk Table                         Men   Women      1/2 Average Risk   3.4   3.3      Average Risk       5.0   4.4      2 X Average Risk   9.6   7.1      3 X Average Risk  23.4   11.0                Use the calculated Patient Ratio     above and the CHD Risk Table     to determine the patient's CHD Risk.                ATP III CLASSIFICATION (LDL):      <100     mg/dL   Optimal      100-129  mg/dL   Near or Above  Optimal      130-159  mg/dL   Borderline      160-189  mg/dL   High      >190     mg/dL   Very High     Performed at [facility]  TROPONIN I     Status: None   Collection Time    01/21/14  5:32 PM      Result Value Ref Range   Troponin I <0.30  <0.30 ng/mL   Comment:            Due to the release kinetics of cTnI,     a negative result within the first hours     of the onset of symptoms does not rule out     myocardial infarction with certainty.     If myocardial infarction is still suspected,     repeat the test at appropriate intervals.  PRO B NATRIURETIC PEPTIDE     Status: Abnormal   Collection Time    01/21/14  5:32 PM      Result Value Ref Range   Pro B Natriuretic peptide (BNP) 4302.0 (*) 0 - 125 pg/mL  GLUCOSE, CAPILLARY     Status: Abnormal   Collection Time    01/21/14  5:46 PM      Result Value Ref Range   Glucose-Capillary 133 (*) 70 - 99 mg/dL  GLUCOSE, CAPILLARY     Status: None   Collection Time    01/21/14  8:20 PM       Result Value Ref Range   Glucose-Capillary 97  70 - 99 mg/dL  TROPONIN I     Status: None   Collection Time    01/21/14 11:05 PM      Result Value Ref Range   Troponin I <0.30  <0.30 ng/mL   Comment:            Due to the release kinetics of cTnI,     a negative result within the first hours     of the onset of symptoms does not rule out     myocardial infarction with certainty.     If myocardial infarction is still suspected,     repeat the test at appropriate intervals.  GLUCOSE, CAPILLARY     Status: Abnormal   Collection Time    01/21/14 11:39 PM      Result Value Ref Range   Glucose-Capillary 161 (*) 70 - 99 mg/dL  GLUCOSE, CAPILLARY     Status: Abnormal   Collection Time    01/22/14  4:17 AM      Result Value Ref Range   Glucose-Capillary 100 (*) 70 - 99 mg/dL  BASIC METABOLIC PANEL     Status: Abnormal   Collection Time    01/22/14  4:53 AM      Result Value Ref Range   Sodium 142  137 - 147 mEq/L   Potassium 4.5  3.7 - 5.3 mEq/L   Comment: DELTA CHECK NOTED     REPEATED TO VERIFY   Chloride 105  96 - 112 mEq/L   CO2 24  19 - 32 mEq/L   Glucose, Bld 76  70 - 99 mg/dL   BUN 40 (*) 6 - 23 mg/dL   Creatinine, Ser 2.05 (*) 0.50 - 1.10 mg/dL   Calcium 8.6  8.4 - 10.5 mg/dL   GFR calc non Af Amer 24 (*) >90 mL/min   GFR calc Af Amer 27 (*) >90 mL/min  Comment: (NOTE)     The eGFR has been calculated using the CKD EPI equation.     This calculation has not been validated in all clinical situations.     eGFR's persistently <90 mL/min signify possible Chronic Kidney     Disease.  TROPONIN I     Status: None   Collection Time    01/22/14  5:00 AM      Result Value Ref Range   Troponin I <0.30  <0.30 ng/mL   Comment:            Due to the release kinetics of cTnI,     a negative result within the first hours     of the onset of symptoms does not rule out     myocardial infarction with certainty.     If myocardial infarction is still suspected,     repeat the test  at appropriate intervals.  URINALYSIS, ROUTINE W REFLEX MICROSCOPIC     Status: Abnormal   Collection Time    01/22/14  5:20 AM      Result Value Ref Range   Color, Urine YELLOW  YELLOW   APPearance CLEAR  CLEAR   Specific Gravity, Urine 1.009  1.005 - 1.030   pH 6.0  5.0 - 8.0   Glucose, UA NEGATIVE  NEGATIVE mg/dL   Hgb urine dipstick NEGATIVE  NEGATIVE   Bilirubin Urine NEGATIVE  NEGATIVE   Ketones, ur NEGATIVE  NEGATIVE mg/dL   Protein, ur 100 (*) NEGATIVE mg/dL   Urobilinogen, UA 0.2  0.0 - 1.0 mg/dL   Nitrite NEGATIVE  NEGATIVE   Leukocytes, UA NEGATIVE  NEGATIVE  URINE CULTURE     Status: None   Collection Time    01/22/14  5:20 AM      Result Value Ref Range   Specimen Description URINE, RANDOM     Special Requests NONE     Culture  Setup Time       Value: 01/22/2014 08:40     Performed at [facility]       Value: >=100,000 COLONIES/ML     Performed at [facility]   Culture       Value: Multiple bacterial morphotypes present, none predominant. Suggest appropriate recollection if clinically indicated.     Performed at [facility]   Report Status 01/23/2014 FINAL    URINE MICROSCOPIC-ADD ON     Status: None   Collection Time    01/22/14  5:20 AM      Result Value Ref Range   Squamous Epithelial / LPF RARE  RARE   WBC, UA 0-2  <3 WBC/hpf   Bacteria, UA RARE  RARE  GLUCOSE, CAPILLARY     Status: Abnormal   Collection Time    01/22/14  7:30 AM      Result Value Ref Range   Glucose-Capillary 52 (*) 70 - 99 mg/dL  GLUCOSE, CAPILLARY     Status: Abnormal   Collection Time    01/22/14  8:27 AM      Result Value Ref Range   Glucose-Capillary 107 (*) 70 - 99 mg/dL  GLUCOSE, CAPILLARY     Status: Abnormal   Collection Time    01/22/14 11:13 AM      Result Value Ref Range   Glucose-Capillary 124 (*) 70 - 99 mg/dL  GLUCOSE, CAPILLARY     Status: Abnormal   Collection Time    01/22/14  4:38 PM  Result Value Ref Range    Glucose-Capillary 180 (*) 70 - 99 mg/dL  GLUCOSE, CAPILLARY     Status: Abnormal   Collection Time    01/22/14  9:30 PM      Result Value Ref Range   Glucose-Capillary 176 (*) 70 - 99 mg/dL  GLUCOSE, CAPILLARY     Status: Abnormal   Collection Time    01/23/14  7:25 AM      Result Value Ref Range   Glucose-Capillary 54 (*) 70 - 99 mg/dL  GLUCOSE, CAPILLARY     Status: None   Collection Time    01/23/14  7:57 AM      Result Value Ref Range   Glucose-Capillary 93  70 - 99 mg/dL  BASIC METABOLIC PANEL     Status: Abnormal   Collection Time    01/23/14 10:45 AM      Result Value Ref Range   Sodium 139  137 - 147 mEq/L   Potassium 4.6  3.7 - 5.3 mEq/L   Chloride 102  96 - 112 mEq/L   CO2 26  19 - 32 mEq/L   Glucose, Bld 189 (*) 70 - 99 mg/dL   BUN 46 (*) 6 - 23 mg/dL   Creatinine, Ser 2.13 (*) 0.50 - 1.10 mg/dL   Calcium 8.3 (*) 8.4 - 10.5 mg/dL   GFR calc non Af Amer 23 (*) >90 mL/min   GFR calc Af Amer 26 (*) >90 mL/min   Comment: (NOTE)     The eGFR has been calculated using the CKD EPI equation.     This calculation has not been validated in all clinical situations.     eGFR's persistently <90 mL/min signify possible Chronic Kidney     Disease.  GLUCOSE, CAPILLARY     Status: Abnormal   Collection Time    01/23/14 11:21 AM      Result Value Ref Range   Glucose-Capillary 171 (*) 70 - 99 mg/dL    Mr [facility] Wo Contrast  01/21/2014   CLINICAL DATA:  Weakness and dizziness. Not able to ambulate. Diabetic hypertensive patient with renal disorder.  EXAM: MRI HEAD WITHOUT CONTRAST  MRA HEAD WITHOUT CONTRAST  TECHNIQUE: Multiplanar, multiecho pulse sequences of the brain and surrounding structures were obtained without intravenous contrast. Angiographic images of the head were obtained using MRA technique without contrast.  COMPARISON:  01/21/2014 head CT.  No comparison brain MR.  FINDINGS: MRI HEAD FINDINGS  No acute infarct.  Left parietal region extra-axial lenticular shaped  3.5 x 2.8 x 1 cm lesion has an appearance suggestive of a meningioma. No evidence to suggest this represents a posttraumatic blood collection. When this is evaluated on follow-up imaging, contrast enhanced imaging may be considered.  Mild small vessel disease type changes.  No hydrocephalus.  Major intracranial vascular structures are patent.  Exophthalmos.  C3-4 bulge with mild spinal stenosis.  Partially empty sella which does not appear to be extended.  Cervical medullary junction unremarkable.  MRA HEAD FINDINGS  Mild narrowing supraclinoid segment of the internal carotid artery more notable on the left.  High grade focal stenosis A1 segment left anterior cerebral artery.  Mild narrowing A1 segment right anterior cerebral artery.  Mild to moderate narrowing distal M1 segment left middle cerebral artery.  Moderate to marked narrowing distal M1 segment right middle cerebral artery/right middle cerebral artery bifurcation.  Middle cerebral artery mild to moderate branch vessel irregularity bilaterally.  A2 segment anterior cerebral artery narrowing and irregularity  greater on the right.  Ectatic vertebral arteries with mild to slightly moderate narrowing.  Full extent of the posterior inferior cerebellar arteries not imaged.  Mild to moderate narrowing of portions of the ectatic basilar artery.  Anterior circulation contributes to the posterior cerebral artery supply and therefore subsequent small vertebrobasilar system.  Nonvisualized anterior inferior cerebellar arteries and left superior cerebellar artery with narrowed right superior cerebral artery.  Moderate to marked regions of narrowing involving the portions of the posterior cerebral artery bilaterally.  No aneurysm detected on this slightly motion degraded exam.  IMPRESSION: MRI HEAD:  No acute infarct.  Left parietal region extra-axial lenticular shaped 3.5 x 2.8 x 1 cm lesion has an appearance suggestive of a meningioma. No evidence to suggest this  represents a posttraumatic blood collection. When this is evaluated on follow-up imaging, contrast enhanced imaging may be considered.  Mild small vessel disease type changes.  Exophthalmos.  C3-4 bulge with mild spinal stenosis.  MRA HEAD:  Exam is motion degraded which may accentuate above described intracranial atherosclerotic type changes.   Electronically Signed   By: Chauncey Cruel M.D.   On: 01/21/2014 17:14   Mr Brain Wo Contrast  01/21/2014   CLINICAL DATA:  Weakness and dizziness. Not able to ambulate. Diabetic hypertensive patient with renal disorder.  EXAM: MRI HEAD WITHOUT CONTRAST  MRA HEAD WITHOUT CONTRAST  TECHNIQUE: Multiplanar, multiecho pulse sequences of the brain and surrounding structures were obtained without intravenous contrast. Angiographic images of the head were obtained using MRA technique without contrast.  COMPARISON:  01/21/2014 head CT.  No comparison brain MR.  FINDINGS: MRI HEAD FINDINGS  No acute infarct.  Left parietal region extra-axial lenticular shaped 3.5 x 2.8 x 1 cm lesion has an appearance suggestive of a meningioma. No evidence to suggest this represents a posttraumatic blood collection. When this is evaluated on follow-up imaging, contrast enhanced imaging may be considered.  Mild small vessel disease type changes.  No hydrocephalus.  Major intracranial vascular structures are patent.  Exophthalmos.  C3-4 bulge with mild spinal stenosis.  Partially empty sella which does not appear to be extended.  Cervical medullary junction unremarkable.  MRA HEAD FINDINGS  Mild narrowing supraclinoid segment of the internal carotid artery more notable on the left.  High grade focal stenosis A1 segment left anterior cerebral artery.  Mild narrowing A1 segment right anterior cerebral artery.  Mild to moderate narrowing distal M1 segment left middle cerebral artery.  Moderate to marked narrowing distal M1 segment right middle cerebral artery/right middle cerebral artery bifurcation.   Middle cerebral artery mild to moderate branch vessel irregularity bilaterally.  A2 segment anterior cerebral artery narrowing and irregularity greater on the right.  Ectatic vertebral arteries with mild to slightly moderate narrowing.  Full extent of the posterior inferior cerebellar arteries not imaged.  Mild to moderate narrowing of portions of the ectatic basilar artery.  Anterior circulation contributes to the posterior cerebral artery supply and therefore subsequent small vertebrobasilar system.  Nonvisualized anterior inferior cerebellar arteries and left superior cerebellar artery with narrowed right superior cerebral artery.  Moderate to marked regions of narrowing involving the portions of the posterior cerebral artery bilaterally.  No aneurysm detected on this slightly motion degraded exam.  IMPRESSION: MRI HEAD:  No acute infarct.  Left parietal region extra-axial lenticular shaped 3.5 x 2.8 x 1 cm lesion has an appearance suggestive of a meningioma. No evidence to suggest this represents a posttraumatic blood collection. When this is evaluated on follow-up imaging,  contrast enhanced imaging may be considered.  Mild small vessel disease type changes.  Exophthalmos.  C3-4 bulge with mild spinal stenosis.  MRA HEAD:  Exam is motion degraded which may accentuate above described intracranial atherosclerotic type changes.   Electronically Signed   By: Chauncey Cruel M.D.   On: 01/21/2014 17:14    Echocardiogram Study Conclusions  - Left ventricle: The cavity size was normal. There was moderate concentric hypertrophy. Systolic function was severely reduced. The estimated ejection fraction was in the range of 25% to 30%. Wall motion was normal; there were no regional wall motion abnormalities. Doppler parameters are consistent with abnormal left ventricular relaxation (grade 1 diastolic dysfunction). There was no evidence of elevated ventricular filling pressure by Doppler parameters. - Aortic  valve: Trileaflet; normal thickness leaflets. There was no regurgitation. - Aortic root: The aortic root was normal in size. - Mitral valve: Calcified annulus. Mildly thickened leaflets . There was no regurgitation. - Right ventricle: Systolic function was normal. - Right atrium: The atrium was normal in size. - Tricuspid valve: There was trivial regurgitation. - Pulmonary arteries: Systolic pressure was within the normal range. - Pericardium, extracardiac: There was no pericardial effusion.  Impressions:  - Normal left ventricular size, severely decreased LVEF 25-30% with diffuse hypokinesis. Impaired relaxation with elevated filling pressures. Normal RV size and function. Normal RVSP.   EKG  Lateral ST changes.  Atrial enlargement, long QTc 511 Review of Systems  Constitutional: Negative for fever and diaphoresis.  HENT: Negative for congestion and sore throat.   Respiratory: Positive for cough and shortness of breath.   Cardiovascular: Positive for chest pain and leg swelling. Negative for orthopnea, claudication and PND.  Gastrointestinal: Positive for nausea. Negative for vomiting, abdominal pain, blood in stool and melena.  Genitourinary: Negative for hematuria.  Musculoskeletal: Negative for myalgias.  Neurological: Positive for dizziness.  All other systems reviewed and are negative.  Blood pressure 143/46, pulse 72, temperature 98 F (36.7 C), temperature source Oral, resp. rate 20, height _0  (1.6 m), weight 199 lb 4.7 oz (90.4 kg), SpO2 95.00%. Physical Exam  Nursing note and vitals reviewed. Constitutional: She is oriented to person, place, and time. She appears well-developed. No distress.  Obese  HENT:  Head: Normocephalic and atraumatic.  Eyes: EOM are normal. Pupils are equal, round, and reactive to light. No scleral icterus.  Neck: Normal range of motion. Neck supple. No JVD present.  Cardiovascular: Normal rate, regular rhythm, S1 normal and S2 normal.     No murmur heard. Pulses:      Radial pulses are 2+ on the right side, and 2+ on the left side.       Dorsalis pedis pulses are 1+ on the right side, and 1+ on the left side.  No carotid bruit  Respiratory: Effort normal and breath sounds normal. No respiratory distress. She has no wheezes. She has no rales.  GI: Soft. Bowel sounds are normal. She exhibits no distension. Tenderness: Right lateral.  Musculoskeletal: She exhibits no edema.  Lymphadenopathy:    She has no cervical adenopathy.  Neurological: She is alert and oriented to person, place, and time. She exhibits normal muscle tone.  Skin: Skin is warm and dry.  ~2-3cm lesions on both ankles in various stages of healing.  Psychiatric: She has a normal mood and affect.    Assessment/Plan: Active Problems:   Cardiomyopathy Could be nonischemic and secondary to HTN.  Moderate LVH on echo.  There are lateral TW/ST changes which  could indicate ischemia.  She is an obese diabetic(A1C 7.2) with an LDL of 173 and smokes.  Needs ischemic evaluation.  SCr > 2.0.  ~1.78 in December.  Lexiscan tomorrow.   We discussed low sodium diet and daily weight monitoring.  Will need reenforcement at DC.     Hypertensive urgency    BP improved on lasix 63m daily, irbesartan 150, lopressor 25 bid.     Hyperlipidemia  On zocor.  Untreated previously      Nicotine dependence   Type II or unspecified type diabetes mellitus with peripheral circulatory disorders, uncontrolled(250.72)   BPPV (benign paroxysmal positional vertigo)    HAGER, BRYAN 01/23/2014, 3:14 PM   Reviewed chart and patient examined  ECG LVH with strain  Diffuse hypokinesis on echo Most likely non ischemic DCM.  With Cr over 2.0 prefer to avoid dye with cardiac CT or cath Lexiscan myovue in am Should be easy to tell if she has any previous infarct as an etiology of her severely decreased EF.  She would be a good person to f/u in CHF clinic to titrate Her medications post d/c .   Further recommendations based on myovue results.  Discussed importance of better BP control with patient  PJenkins Rouge

## 2014-01-24 ENCOUNTER — Telehealth: Payer: Self-pay | Admitting: Physician Assistant

## 2014-01-24 ENCOUNTER — Observation Stay (HOSPITAL_COMMUNITY)
Admit: 2014-01-24 | Discharge: 2014-01-24 | Disposition: A | Discharge status: Home / Self Care | Payer: PRIVATE HEALTH INSURANCE | Attending: Physician Assistant | Admitting: Physician Assistant

## 2014-01-24 ENCOUNTER — Ambulatory Visit (HOSPITAL_COMMUNITY)
Admit: 2014-01-24 | Discharge: 2014-01-24 | Disposition: A | Discharge status: Home / Self Care | Payer: PRIVATE HEALTH INSURANCE | Attending: Physician Assistant | Admitting: Physician Assistant

## 2014-01-24 ENCOUNTER — Other Ambulatory Visit: Payer: Self-pay

## 2014-01-24 DIAGNOSIS — Z992 Dependence on renal dialysis: Secondary | ICD-10-CM

## 2014-01-24 DIAGNOSIS — N186 End stage renal disease: Secondary | ICD-10-CM | POA: Diagnosis present

## 2014-01-24 DIAGNOSIS — R079 Chest pain, unspecified: Secondary | ICD-10-CM

## 2014-01-24 LAB — BASIC METABOLIC PANEL
BUN: 50 mg/dL — AB (ref 6–23)
CALCIUM: 8 mg/dL — AB (ref 8.4–10.5)
CO2: 25 meq/L (ref 19–32)
CREATININE: 2.09 mg/dL — AB (ref 0.50–1.10)
Chloride: 103 mEq/L (ref 96–112)
GFR calc Af Amer: 27 mL/min — ABNORMAL LOW (ref >90)
GFR calc non Af Amer: 23 mL/min — ABNORMAL LOW (ref >90)
Glucose, Bld: 150 mg/dL — ABNORMAL HIGH (ref 70–99)
Potassium: 4.2 mEq/L (ref 3.7–5.3)
Sodium: 141 mEq/L (ref 137–147)

## 2014-01-24 LAB — GLUCOSE, CAPILLARY
Glucose-Capillary: 139 mg/dL — ABNORMAL HIGH (ref 70–99)
Glucose-Capillary: 177 mg/dL — ABNORMAL HIGH (ref 70–99)
Glucose-Capillary: 85 mg/dL (ref 70–99)

## 2014-01-24 MED ORDER — TECHNETIUM TC 99M SESTAMIBI GENERIC - CARDIOLITE
10.0000 | Freq: Once | INTRAVENOUS | Status: AC | PRN
  Administered 2014-01-24: 10 via INTRAVENOUS

## 2014-01-24 MED ORDER — SIMVASTATIN 20 MG PO TABS: 20.0000 mg | ORAL_TABLET | Freq: Every day | ORAL | Status: DC

## 2014-01-24 MED ORDER — MECLIZINE HCL 12.5 MG PO TABS: 12.5000 mg | ORAL_TABLET | Freq: Three times a day (TID) | ORAL | Status: DC | PRN

## 2014-01-24 MED ORDER — CARVEDILOL 6.25 MG PO TABS: 6.2500 mg | ORAL_TABLET | Freq: Two times a day (BID) | ORAL | Status: DC

## 2014-01-24 MED ORDER — ASPIRIN 81 MG PO TBEC: 81.0000 mg | DELAYED_RELEASE_TABLET | Freq: Every day | ORAL | Status: DC

## 2014-01-24 MED ORDER — CARVEDILOL 6.25 MG PO TABS
6.2500 mg | ORAL_TABLET | Freq: Two times a day (BID) | ORAL | Status: DC
  Administered 2014-01-24 – 2014-01-30 (×13): 6.25 mg via ORAL
  Filled 2014-01-24: qty 1
  Filled 2014-01-24: qty 2
  Filled 2014-01-24 (×3): qty 1
  Filled 2014-01-24: qty 2
  Filled 2014-01-24 (×11): qty 1

## 2014-01-24 MED ORDER — REGADENOSON 0.4 MG/5ML IV SOLN
0.4000 mg | Freq: Once | INTRAVENOUS | Status: AC
  Administered 2014-01-24: 0.4 mg via INTRAVENOUS

## 2014-01-24 MED ORDER — TELMISARTAN 40 MG PO TABS: 40.0000 mg | ORAL_TABLET | Freq: Every day | ORAL | Status: DC

## 2014-01-24 MED ORDER — TECHNETIUM TC 99M SESTAMIBI GENERIC - CARDIOLITE
30.0000 | Freq: Once | INTRAVENOUS | Status: AC | PRN
  Administered 2014-01-24: 30 via INTRAVENOUS

## 2014-01-24 MED ORDER — TECHNETIUM TC 99M SESTAMIBI - CARDIOLITE: 30.0000 | Freq: Once | INTRAVENOUS | Status: DC | PRN

## 2014-01-24 MED ORDER — NICOTINE 21 MG/24HR TD PT24: 21.0000 mg | MEDICATED_PATCH | Freq: Every day | TRANSDERMAL | Status: DC

## 2014-01-24 MED ORDER — REGADENOSON 0.4 MG/5ML IV SOLN
INTRAVENOUS | Status: AC
  Administered 2014-01-24: 0.4 mg via INTRAVENOUS
  Filled 2014-01-24: qty 5

## 2014-01-24 NOTE — Progress Notes (Signed)
Pt selected Kathleen [name] for [facility] needs, referral given to in house rep.

## 2014-01-24 NOTE — Discharge Instructions (Signed)

## 2014-01-24 NOTE — Progress Notes (Signed)
PT Cancellation Note  Patient Details Name: AASHNA MACHA MRN: [id number] DOB: January 26, 1946   Cancelled Treatment:    Reason Eval/Treat Not Completed: Patient at procedure or test/unavailable Procedure at [facility] 01/24/2014, 11:38 AM Carmelia Bake, PT, DPT 01/24/2014 Pager: [phone]

## 2014-01-24 NOTE — Telephone Encounter (Signed)
RN spoke with Kathleen [name] at [facility] Radiology. Patient had nuclear study - read by Dr. Sandi Mariscal (radiologist)  Suspected prior infarct of inferior wall of left ventricle. Patient was in hospital and discharged home today. Cardiology was consulted. Patient was discharged before test had resulted. RN requested that study be faxed to our office for MD to review.

## 2014-01-24 NOTE — Progress Notes (Signed)
    Subjective:  Denies CP or dyspnea; complains of dizziness   Objective:  Filed Vitals:   01/23/14 1022 01/23/14 1529 01/23/14 2107 01/24/14 0503  BP: 143/46 140/61 153/56 117/48  Pulse:  79 78 68  Temp:  98.4 F (36.9 C) 98.2 F (36.8 C) 98.3 F (36.8 C)  TempSrc:  Oral Oral Oral  Resp:  20 20 18   Height:      Weight:    198 lb 3.1 oz (89.9 kg)  SpO2:  100% 97% 96%    Intake/Output from previous day:  Intake/Output Summary (Last 24 hours) at 01/24/14 0703 Last data filed at 01/24/14 0500  Gross per 24 hour  Intake    720 ml  Output   1000 ml  Net   -280 ml    Physical Exam: Physical exam: Well-developed well-nourished in no acute distress.  Skin is warm and dry.  HEENT is normal.  Neck is supple.  Chest is clear to auscultation with normal expansion.  Cardiovascular exam is regular rate and rhythm.  Abdominal exam nontender or distended. No masses palpated. Extremities show trace edema. neuro grossly intact    Lab Results: Basic Metabolic Panel:  Recent Labs  01/23/14 1045 01/24/14 0334  NA 139 141  K 4.6 4.2  CL 102 103  CO2 26 25  GLUCOSE 189* 150*  BUN 46* 50*  CREATININE 2.13* 2.09*  CALCIUM 8.3* 8.0*   CBC:  Recent Labs  01/21/14 1020 01/21/14 1025  WBC 4.5  --   HGB 11.5* 13.3  13.3  HCT 36.4 39.0  39.0  MCV 89.2  --   PLT 249  --    Cardiac Enzymes:  Recent Labs  01/21/14 1732 01/21/14 2305 01/22/14 0500  TROPONINI <0.30 <0.30 <0.30     Assessment/Plan:  1 cardiomyopathy-etiology unclear. Given approximate 25 year history of diabetes mellitus coronary artery disease certainly possible. Plan nuclear study today to screen for coronary disease. She would be high risk for contrast nephropathy if cardiac catheterization required. Note TSH normal. Hypertension could also be contributing. Plan to continue aspirin, ARB and statin. Continue present dose of Lasix. Discontinue metoprolol and begin carvedilol 6.25 mg twice a day.  Further medication titration as an outpatient. 2 chronic stage IV renal failure-follow renal function closely. Would suggest nephrology followup as an outpatient. 3 hypertension-blood pressure has improved. Follow and adjust medications as needed. 4 vertigo/dizziness-further workup per primary care. 5 diabetes mellitus-follow CBG.  Kirk Ruths 01/24/2014, 7:03 AM

## 2014-01-24 NOTE — Progress Notes (Signed)
UR Completed Brenda Graves-Bigelow, RN,BSN [phone]  

## 2014-01-24 NOTE — Discharge Summary (Addendum)
Physician Discharge Summary  Kathleen [name] MWU:[id number] DOB: [birth year] DOA: 01/21/2014  PCP: Benito Mccreedy, MD  Admit date: 01/21/2014 Discharge date: 01/24/2014  Time spent: 40 minutes  Recommendations for Outpatient Follow-up:  1. Discharge home with outpt follow up with PCP, renal and cardiology follow up 2. Please arrange for outpt MRI of brain with contrast to follow up on meningioma in 6 months .  Discharge Diagnoses:  Principal Problem:   BPPV (benign paroxysmal positional vertigo)  Active Problems:   Secondary cardiomyopathy, unspecified   Hypertensive urgency   Nicotine dependence   Type II or unspecified type diabetes mellitus with peripheral circulatory disorders, uncontrolled(250.72)   Hyperlipidemia   CKD stage 3 due to type 2 diabetes mellitus   Discharge Condition: fair  Diet recommendation: diabetic/ cardiac  Filed Weights   01/22/14 0418 01/23/14 0544 01/24/14 0503  Weight: 90.5 kg (199 lb 8.3 oz) 90.4 kg (199 lb 4.7 oz) 89.9 kg (198 lb 3.1 oz)    History of present illness:  Please refer to admission H&P for details but in brief, 68 year old obese female with a history of hypertension, diabetes mellitus, chronic kidney disease stage 3, active smoker presented with one-day history of vertigo symptoms. Work up for TIA showed significant cardiomyopathy.   Hospital Course:  Vertigo  Symptoms likely in the setting of benign positional vertigo. CT head and MRI being negative for stroke. MRI brain shows a 3.5 x 2.8x1 centimeter lesion over her left parietal area suggestive of meningioma. No surrounding edema or midline shift. This is unlikely the cause of her symptoms and is likely an incidental finding.  -Carotid ultrasound without significant stenosis.  Discussed with neurology consult over the phone. Given isolated sx of vertigo and no mass effect or midline shift on MRI and no other associated neurological symptoms, no meningioma is likely an  incidental finding . recommend for outpatient MRI with contrast in 6 months.  -started on meclizine with good response. Will discharge on prn meclizine.  Dilated / ischemic Cardiomyopathy  patient on presentation had elevated proBNP. No signs of volume overload noted. A 2-D echo was done part of workup for TIA and showed significantly reduced EF of 25-30% with grade 1 diastolic dysfunction.  -Patient does not appear to be in acute failure. Added aspirin. , continued metoprolol, Avapro and daily Lasix. Cardiomyopathy likely in the setting of ischemia and has multiple risk factors including uncontrolled HTN, DM, obesity CKD and smoking. . Serial troponin negative.  Cardiology consulted.  patient had stress test this am reported the following 1. Suspected prior infarction involving the inferior wall of the  left ventricle with associated minimal amount of peri-infarct  ischemia.  2. Suspected pharmacologically induced ischemia involving the  anterior and lateral walls of the left ventricle.  3. Mild global hypokinesia without geographic wall motion  abnormality. Ejection fraction - 41%. . Plan for optimal medical management and outpt cardiology follow up. Given her renal function cardiac cath not planned as she would not tolerate the dye load during cath. patient counseled on dietary modifications,  lifestyle changes.  -Added statin for hyperlipidemia   Uncontrolled HTN  Continue current blood pressure medications adjusted. Switched metoprolol to coreg. redued dose of telmisartan. continue Lasix. . Blood pressure stable   Diabetes mellitus  A1c of 7.2. Hypoglycemic episode on 6/18 . Reduce levemir dose to 20 units   CKD  Cr of 2.10. Likely related to diabetic nephropathy and uncontrolled HTN. Reduce telmisartan dose to 40 mg daily. D/w PCP, pts creatinine  in dec 2014 was 1.76. Reports following with Dr Lowell Guitar.  She was informed to schedule appt with him in 4 weeks. instructed to avoid  NSAIDs.  Tobacco use  Counseled on cessation . Prescribed nicotine patch  Patient stable for discharge home. Her vertigo has resolved. Seen by PT and recommend HHPT. Will arrange [facility] to monitor medication compliance.  Code Status: Full code  Family Communication: son at bedside  Disposition Plan: Home with HHPT and RN  Consultants:  Cardiology   Procedures:  2-D echo   Antibiotics:  None     Discharge Exam: Filed Vitals:   01/24/14 0503  BP: 117/48  Pulse: 68  Temp: 98.3 F (36.8 C)  Resp: 18    General: Elderly obese female in no acute distress  HEENT: No pallor, moist oral mucosa, no JVD Cardiovascular: Normal S1-S2, no murmurs rub or gallop  Respiratory: Clear bilaterally  Abdomen: Soft, nontender, nondistended, bowel sounds present  Musculoskeletal: Warm, no edema  CNS: AAO x3, no focal deficit   Discharge Instructions You were cared for by a hospitalist during your hospital stay. If you have any questions about your discharge medications or the care you received while you were in the hospital after you are discharged, you can call the unit and asked to speak with the hospitalist on call if the hospitalist that took care of you is not available. Once you are discharged, your primary care physician will handle any further medical issues. Please note that NO REFILLS for any discharge medications will be authorized once you are discharged, as it is imperative that you return to your primary care physician (or establish a relationship with a primary care physician if you do not have one) for your aftercare needs so that they can reassess your need for medications and monitor your lab values.     Medication List    STOP taking these medications       cephALEXin 500 MG capsule  Commonly known as:  KEFLEX     hydrochlorothiazide 25 MG tablet  Commonly known as:  HYDRODIURIL     pioglitazone-metformin 15-850 MG per tablet  Commonly known as:  ACTOPLUS MET       TAKE these medications       aspirin 81 MG EC tablet  Take 1 tablet (81 mg total) by mouth daily.     carvedilol 6.25 MG tablet  Commonly known as:  COREG  Take 1 tablet (6.25 mg total) by mouth 2 (two) times daily with a meal.     furosemide 40 MG tablet  Commonly known as:  LASIX  Take 40 mg by mouth daily.     insulin detemir 100 UNIT/ML injection  Commonly known as:  LEVEMIR  Inject 20 Units into the skin at bedtime.     meclizine 12.5 MG tablet  Commonly known as:  ANTIVERT  Take 1 tablet (12.5 mg total) by mouth 3 (three) times daily as needed for dizziness or nausea.     nicotine 21 mg/24hr patch  Commonly known as:  NICODERM CQ - dosed in mg/24 hours  Place 1 patch (21 mg total) onto the skin daily.     simvastatin 20 MG tablet  Commonly known as:  ZOCOR  Take 1 tablet (20 mg total) by mouth daily at 6 PM.     telmisartan 40 MG tablet  Commonly known as:  MICARDIS  Take 1 tablet (40 mg total) by mouth daily.       No Known  Allergies     Follow-up Information   Follow up with OSEI-BONSU,GEORGE, MD. Schedule an appointment as soon as possible for a visit in 1 week. (neds renal function monitored)    Specialty:  Internal Medicine   Contact information:   [address] [place] [address] [phone]       Follow up with [facility], MD In 4 weeks.   Specialty:  Nephrology   Contact information:   [place] [address] [phone]       Follow up with Kirk Ruths, MD. Schedule an appointment as soon as possible for a visit in 4 weeks.   Specialty:  Cardiology   Contact information:   [address] [place] [address] [phone]        The results of significant diagnostics from this hospitalization (including imaging, microbiology, ancillary and laboratory) are listed below for reference.    Significant Diagnostic Studies: Ct Head Wo Contrast  01/21/2014    CLINICAL DATA:  Dizziness with nausea.  Possible dehydration.  EXAM: CT HEAD WITHOUT CONTRAST  TECHNIQUE: Contiguous axial images were obtained from the base of the skull through the vertex without intravenous contrast.  COMPARISON:  None.  FINDINGS: There is no evidence of acute intracranial hemorrhage, mass lesion, brain edema or extra-axial fluid collection. The ventricles and subarachnoid spaces are appropriately sized for age. There is no CT evidence of acute cortical infarction. Minimal fat density is noted along the interhemispheric fissure.  The visualized paranasal sinuses, mastoid air cells and middle ears are clear. The calvarium is intact.  IMPRESSION: Unremarkable noncontrast head CT.   Electronically Signed   By: Camie Patience M.D.   On: 01/21/2014 10:33   Mr Jodene Nam Head Wo Contrast  01/21/2014   CLINICAL DATA:  Weakness and dizziness. Not able to ambulate. Diabetic hypertensive patient with renal disorder.  EXAM: MRI HEAD WITHOUT CONTRAST  MRA HEAD WITHOUT CONTRAST  TECHNIQUE: Multiplanar, multiecho pulse sequences of the brain and surrounding structures were obtained without intravenous contrast. Angiographic images of the head were obtained using MRA technique without contrast.  COMPARISON:  01/21/2014 head CT.  No comparison brain MR.  FINDINGS: MRI HEAD FINDINGS  No acute infarct.  Left parietal region extra-axial lenticular shaped 3.5 x 2.8 x 1 cm lesion has an appearance suggestive of a meningioma. No evidence to suggest this represents a posttraumatic blood collection. When this is evaluated on follow-up imaging, contrast enhanced imaging may be considered.  Mild small vessel disease type changes.  No hydrocephalus.  Major intracranial vascular structures are patent.  Exophthalmos.  C3-4 bulge with mild spinal stenosis.  Partially empty sella which does not appear to be extended.  Cervical medullary junction unremarkable.  MRA HEAD FINDINGS  Mild narrowing supraclinoid segment of the internal  carotid artery more notable on the left.  High grade focal stenosis A1 segment left anterior cerebral artery.  Mild narrowing A1 segment right anterior cerebral artery.  Mild to moderate narrowing distal M1 segment left middle cerebral artery.  Moderate to marked narrowing distal M1 segment right middle cerebral artery/right middle cerebral artery bifurcation.  Middle cerebral artery mild to moderate branch vessel irregularity bilaterally.  A2 segment anterior cerebral artery narrowing and irregularity greater on the right.  Ectatic vertebral arteries with mild to slightly moderate narrowing.  Full extent  of the posterior inferior cerebellar arteries not imaged.  Mild to moderate narrowing of portions of the ectatic basilar artery.  Anterior circulation contributes to the posterior cerebral artery supply and therefore subsequent small vertebrobasilar system.  Nonvisualized anterior inferior cerebellar arteries and left superior cerebellar artery with narrowed right superior cerebral artery.  Moderate to marked regions of narrowing involving the portions of the posterior cerebral artery bilaterally.  No aneurysm detected on this slightly motion degraded exam.  IMPRESSION: MRI HEAD:  No acute infarct.  Left parietal region extra-axial lenticular shaped 3.5 x 2.8 x 1 cm lesion has an appearance suggestive of a meningioma. No evidence to suggest this represents a posttraumatic blood collection. When this is evaluated on follow-up imaging, contrast enhanced imaging may be considered.  Mild small vessel disease type changes.  Exophthalmos.  C3-4 bulge with mild spinal stenosis.  MRA HEAD:  Exam is motion degraded which may accentuate above described intracranial atherosclerotic type changes.   Electronically Signed   By: Chauncey Cruel M.D.   On: 01/21/2014 17:14   Mr Brain Wo Contrast  01/21/2014   CLINICAL DATA:  Weakness and dizziness. Not able to ambulate. Diabetic hypertensive patient with renal disorder.  EXAM: MRI  HEAD WITHOUT CONTRAST  MRA HEAD WITHOUT CONTRAST  TECHNIQUE: Multiplanar, multiecho pulse sequences of the brain and surrounding structures were obtained without intravenous contrast. Angiographic images of the head were obtained using MRA technique without contrast.  COMPARISON:  01/21/2014 head CT.  No comparison brain MR.  FINDINGS: MRI HEAD FINDINGS  No acute infarct.  Left parietal region extra-axial lenticular shaped 3.5 x 2.8 x 1 cm lesion has an appearance suggestive of a meningioma. No evidence to suggest this represents a posttraumatic blood collection. When this is evaluated on follow-up imaging, contrast enhanced imaging may be considered.  Mild small vessel disease type changes.  No hydrocephalus.  Major intracranial vascular structures are patent.  Exophthalmos.  C3-4 bulge with mild spinal stenosis.  Partially empty sella which does not appear to be extended.  Cervical medullary junction unremarkable.  MRA HEAD FINDINGS  Mild narrowing supraclinoid segment of the internal carotid artery more notable on the left.  High grade focal stenosis A1 segment left anterior cerebral artery.  Mild narrowing A1 segment right anterior cerebral artery.  Mild to moderate narrowing distal M1 segment left middle cerebral artery.  Moderate to marked narrowing distal M1 segment right middle cerebral artery/right middle cerebral artery bifurcation.  Middle cerebral artery mild to moderate branch vessel irregularity bilaterally.  A2 segment anterior cerebral artery narrowing and irregularity greater on the right.  Ectatic vertebral arteries with mild to slightly moderate narrowing.  Full extent of the posterior inferior cerebellar arteries not imaged.  Mild to moderate narrowing of portions of the ectatic basilar artery.  Anterior circulation contributes to the posterior cerebral artery supply and therefore subsequent small vertebrobasilar system.  Nonvisualized anterior inferior cerebellar arteries and left superior  cerebellar artery with narrowed right superior cerebral artery.  Moderate to marked regions of narrowing involving the portions of the posterior cerebral artery bilaterally.  No aneurysm detected on this slightly motion degraded exam.  IMPRESSION: MRI HEAD:  No acute infarct.  Left parietal region extra-axial lenticular shaped 3.5 x 2.8 x 1 cm lesion has an appearance suggestive of a meningioma. No evidence to suggest this represents a posttraumatic blood collection. When this is evaluated on follow-up imaging, contrast enhanced imaging may be considered.  Mild small vessel disease type changes.  Exophthalmos.  C3-4  bulge with mild spinal stenosis.  MRA HEAD:  Exam is motion degraded which may accentuate above described intracranial atherosclerotic type changes.   Electronically Signed   By: Chauncey Cruel M.D.   On: 01/21/2014 17:14    Microbiology: Recent Results (from the past 240 hour(s))  CULTURE, BLOOD (ROUTINE X 2)     Status: None   Collection Time    01/21/14  4:34 PM      Result Value Ref Range Status   Specimen Description BLOOD RIGHT HAND   Final   Special Requests BOTTLES DRAWN AEROBIC ONLY 3CC   Final   Culture  Setup Time     Final   Value: 01/22/2014 01:49     Performed at [facility]   Culture     Final   Value:        BLOOD CULTURE RECEIVED NO GROWTH TO DATE CULTURE WILL BE HELD FOR 5 DAYS BEFORE ISSUING A FINAL NEGATIVE REPORT     Performed at [facility]   Report Status PENDING   Incomplete  CULTURE, BLOOD (ROUTINE X 2)     Status: None   Collection Time    01/21/14  5:15 PM      Result Value Ref Range Status   Specimen Description BLOOD LEFT ARM   Final   Special Requests BOTTLES DRAWN AEROBIC AND ANAEROBIC 8CC   Final   Culture  Setup Time     Final   Value: 01/22/2014 01:50     Performed at [facility]   Culture     Final   Value:        BLOOD CULTURE RECEIVED NO GROWTH TO DATE CULTURE WILL BE HELD FOR 5 DAYS BEFORE ISSUING A FINAL NEGATIVE  REPORT     Performed at [facility]   Report Status PENDING   Incomplete  URINE CULTURE     Status: None   Collection Time    01/22/14  5:20 AM      Result Value Ref Range Status   Specimen Description URINE, RANDOM   Final   Special Requests NONE   Final   Culture  Setup Time     Final   Value: 01/22/2014 08:40     Performed at [facility] Count     Final   Value: >=100,000 COLONIES/ML     Performed at [facility]   Culture     Final   Value: Multiple bacterial morphotypes present, none predominant. Suggest appropriate recollection if clinically indicated.     Performed at [facility]   Report Status 01/23/2014 FINAL   Final     Labs: Basic Metabolic Panel:  Recent Labs Lab 01/21/14 1020 01/21/14 1025 01/22/14 0453 01/23/14 1045 01/24/14 0334  NA 139 137  137 142 139 141  K 4.6 6.7*  6.7* 4.5 4.6 4.2  CL 102 105  105 105 102 103  CO2 25  --  _0 GLUCOSE 164* 166*  166* 76 189* 150*  BUN 39* 53*  53* 40* 46* 50*  CREATININE 1.86* 2.00*  2.00* 2.05* 2.13* 2.09*  CALCIUM 8.5  --  8.6 8.3* 8.0*   Liver Function Tests: No results found for this basename: AST, ALT, ALKPHOS, BILITOT, PROT, ALBUMIN,  in the last 168 hours No results found for this basename: LIPASE, AMYLASE,  in the last 168 hours No results found for this basename: AMMONIA,  in the last 168  hours CBC:  Recent Labs Lab 01/21/14 1020 01/21/14 1025  WBC 4.5  --   HGB 11.5* 13.3  13.3  HCT 36.4 39.0  39.0  MCV 89.2  --   PLT 249  --    Cardiac Enzymes:  Recent Labs Lab 01/21/14 1020 01/21/14 1732 01/21/14 2305 01/22/14 0500  TROPONINI <0.30 <0.30 <0.30 <0.30   BNP: BNP (last 3 results)  Recent Labs  01/21/14 1020 01/21/14 1732  PROBNP 3351.0* 4302.0*   CBG:  Recent Labs Lab 01/23/14 1625 01/23/14 2114 01/24/14 0023 01/24/14 0429 01/24/14 0741  GLUCAP 161* 118* 177* 139* 85       Signed:  DHUNGEL, NISHANT  [facility] 01/24/2014, 2:30 PM

## 2014-01-24 NOTE — Progress Notes (Signed)
01/22/14 1428  OT Time Calculation  OT Start Time 1325  OT Stop Time 1349  OT Time Calculation (min) 24 min  OT G-codes **NOT FOR INPATIENT CLASS**  Functional Assessment Tool Used (clinical observation)  Functional Limitation Self care  Self Care Current Status [id number]) CI  Self Care Goal Status [id number]) CI  Self Care Discharge Status [id number]) CI  OT General Charges  $OT Visit 1 Procedure  OT Evaluation  $Initial OT Evaluation Tier I 1 Procedure  OT Treatments  $Therapeutic Activity 8-22 mins  OT eval addendum G Codes added to observation status

## 2014-01-24 NOTE — Telephone Encounter (Signed)
Study is resulted in [facility]  Per patient discharge, patient to follow up with Dr. Stanford Breed  Will defer to Dr. Jacalyn Lefevre nurse to communicate with patient/set up hospital follow up.

## 2014-01-24 NOTE — Progress Notes (Signed)
Reviewed updated stress test result with Dr Stanford Breed, given findings of significant ischemia, he recommended holding off on discharge and he would discuss with her in the morning. Will hold off her discharge.

## 2014-01-25 LAB — BASIC METABOLIC PANEL
BUN: 50 mg/dL — ABNORMAL HIGH (ref 6–23)
CHLORIDE: 102 meq/L (ref 96–112)
CO2: 27 mEq/L (ref 19–32)
Calcium: 8.1 mg/dL — ABNORMAL LOW (ref 8.4–10.5)
Creatinine, Ser: 1.88 mg/dL — ABNORMAL HIGH (ref 0.50–1.10)
GFR calc Af Amer: 30 mL/min — ABNORMAL LOW (ref >90)
GFR, EST NON AFRICAN AMERICAN: 26 mL/min — AB (ref >90)
GLUCOSE: 148 mg/dL — AB (ref 70–99)
POTASSIUM: 4.7 meq/L (ref 3.7–5.3)
SODIUM: 140 meq/L (ref 137–147)

## 2014-01-25 LAB — GLUCOSE, CAPILLARY
GLUCOSE-CAPILLARY: 125 mg/dL — AB (ref 70–99)
Glucose-Capillary: 169 mg/dL — ABNORMAL HIGH (ref 70–99)

## 2014-01-25 MED ORDER — SODIUM CHLORIDE 0.9 % IV SOLN
1.0000 mL/kg/h | INTRAVENOUS | Status: DC
  Administered 2014-01-27: 1 mL/kg/h via INTRAVENOUS

## 2014-01-25 MED ORDER — SODIUM CHLORIDE 0.9 % IJ SOLN
3.0000 mL | Freq: Two times a day (BID) | INTRAMUSCULAR | Status: DC
  Administered 2014-01-25 – 2014-01-27 (×5): 3 mL via INTRAVENOUS

## 2014-01-25 MED ORDER — SODIUM CHLORIDE 0.9 % IV SOLN
250.0000 mL | INTRAVENOUS | Status: DC | PRN
  Administered 2014-01-25: 250 mL via INTRAVENOUS

## 2014-01-25 MED ORDER — SODIUM CHLORIDE 0.9 % IJ SOLN: 3.0000 mL | INTRAMUSCULAR | Status: DC | PRN

## 2014-01-25 MED ORDER — SODIUM CHLORIDE 0.9 % IV SOLN: 1.0000 mL/kg/h | INTRAVENOUS | Status: DC

## 2014-01-25 MED ORDER — ASPIRIN 81 MG PO CHEW
81.0000 mg | CHEWABLE_TABLET | ORAL | Status: DC
Start: 2014-01-26 — End: 2014-01-25
  Filled 2014-01-25: qty 1

## 2014-01-25 MED ORDER — ASPIRIN 81 MG PO CHEW
81.0000 mg | CHEWABLE_TABLET | ORAL | Status: AC
Start: 2014-01-27 — End: 2014-01-27
  Administered 2014-01-27: 81 mg via ORAL
  Filled 2014-01-25 (×2): qty 1

## 2014-01-25 NOTE — Progress Notes (Signed)
CARE MANAGEMENT NOTE 01/25/2014  Patient:  Kathleen [name]   Account Number:  [id number]  Date Initiated:  01/24/2014  Documentation initiated by:  [facility]  Subjective/Objective Assessment:   benign paroxysmal positional vertigo     Action/Plan:   Anticipated DC Date:  01/26/2014   Anticipated DC Plan:  [place]  CM consult      [facility] Choice  HOME HEALTH   Choice offered to / List presented to:  C-1 Patient   DME arranged  Vassie Moselle      DME agency  [place] arranged  HH-1 RN  [place].   Status of service:  Completed, signed off Medicare Important Message given?  YES (If response is "NO", the following Medicare IM given date fields will be blank) Date Medicare IM given:  01/18/2014 Date Additional Medicare IM given:    Discharge Disposition:  [place]  Per UR Regulation:    If discussed at Long Length of Stay Meetings, dates discussed:    Comments:  01/25/2014 1350 NCM spoke to pt and offered choice for [facility]. Pt requested [facility] for HH. Pt requested RW for home. Additional Medicare IM given. Jonnie Finner RN CCM Case Mgmt phone [phone]

## 2014-01-25 NOTE — Progress Notes (Signed)
[facility] PROGRESS NOTE  Kathleen [name] [id number] DOB: May 15, 1946 DOA: 01/21/2014 PCP: Kathleen Mccreedy, MD  Assessment/Plan: Benign positional vertigo Symptoms improving with meclizine. Incidental finding of left parietal lobe meningioma which needs followup with MRI with contrast in 6 months.  Dilated / ischemic Cardiomyopathy  patient on presentation had elevated proBNP. No signs of volume overload noted. A 2-D echo was done part of workup for TIA and showed significantly reduced EF of 25-30% with grade 1 diastolic dysfunction.  -No signs of failure. Started on aspirin, carvedilol. Continued Lasix and ARB. Cardiology consulted and a stress test done on 6/18 suggestive of ischemic cardiomyopathy with EF of 41% with inferior infarct and possible ischemia of anterior lateral wall. -Discharge has and cardiology plan on cardiac cath on 6/22. Patient is at high risk for contrast-induced nephropathy. Discontinued Lasix and ARB. -Added statin for hyperlipidemia . Counseled on medication compliance and lifestyle modification  Uncontrolled HTN  Currently stable. Continue coreg  Diabetes mellitus  A1c of 7.2. Hypoglycemic episode on 6/18 . Reduced levemir dose to 20 units   CKD  Cr of 2.10. Likely related to diabetic nephropathy and uncontrolled HTN. Hold ARB and Lasix. As per PCP, pts creatinine in dec 2014 was 1.76. Reports following with Kathleen [name].  -Will ask renal to evaluate as patient would be receiving contrast with cardiac cath and has high risk for contrast-induced nephropathy.  Tobacco use  Counseled on cessation . Prescribed nicotine patch   HPI/Subjective: Patient seen and examined this morning . vertigo has improved. Discharged canceled yesterday as stress test was positive.  Objective: Filed Vitals:   01/25/14 0525  BP: 128/59  Pulse: 78  Temp: 97.8 F (36.6 C)  Resp: 17    Intake/Output Summary (Last 24 hours) at 01/25/14 1109 Last data filed at  01/25/14 0900  Gross per 24 hour  Intake    480 ml  Output    300 ml  Net    180 ml   Filed Weights   01/23/14 0544 01/24/14 0503 01/25/14 0525  Weight: 90.4 kg (199 lb 4.7 oz) 89.9 kg (198 lb 3.1 oz) 90.2 kg (198 lb 13.7 oz)    Exam:  General: Elderly obese female in no acute distress  HEENT: No pallor, moist oral mucosa, no JVD  Cardiovascular: Normal S1-S2, no murmurs rub or gallop  Respiratory: Clear bilaterally  Abdomen: Soft, nontender, nondistended, bowel sounds present  Musculoskeletal: Warm, no edema  CNS: AAO x3, no focal deficit    Data Reviewed: Basic Metabolic Panel:  Recent Labs Lab 01/21/14 1020 01/21/14 1025 01/22/14 0453 01/23/14 1045 01/24/14 0334  NA 139 137  137 142 139 141  K 4.6 6.7*  6.7* 4.5 4.6 4.2  CL 102 105  105 105 102 103  CO2 25  --  24 26 25   GLUCOSE 164* 166*  166* 76 189* 150*  BUN 39* 53*  53* 40* 46* 50*  CREATININE 1.86* 2.00*  2.00* 2.05* 2.13* 2.09*  CALCIUM 8.5  --  8.6 8.3* 8.0*   Liver Function Tests: No results found for this basename: AST, ALT, ALKPHOS, BILITOT, PROT, ALBUMIN,  in the last 168 hours No results found for this basename: LIPASE, AMYLASE,  in the last 168 hours No results found for this basename: AMMONIA,  in the last 168 hours CBC:  Recent Labs Lab 01/21/14 1020 01/21/14 1025  WBC 4.5  --   HGB 11.5* 13.3  13.3  HCT 36.4 39.0  39.0  MCV 89.2  --  PLT 249  --    Cardiac Enzymes:  Recent Labs Lab 01/21/14 1020 01/21/14 1732 01/21/14 2305 01/22/14 0500  TROPONINI <0.30 <0.30 <0.30 <0.30   BNP (last 3 results)  Recent Labs  01/21/14 1020 01/21/14 1732  PROBNP 3351.0* 4302.0*   CBG:  Recent Labs Lab 01/23/14 1625 01/23/14 2114 01/24/14 0023 01/24/14 0429 01/24/14 0741  GLUCAP 161* 118* 177* 139* 85    Recent Results (from the past 240 hour(s))  CULTURE, BLOOD (ROUTINE X 2)     Status: None   Collection Time    01/21/14  4:34 PM      Result Value Ref Range Status    Specimen Description BLOOD RIGHT HAND   Final   Special Requests BOTTLES DRAWN AEROBIC ONLY 3CC   Final   Culture  Setup Time     Final   Value: 01/22/2014 01:49     Performed at [facility]   Culture     Final   Value:        BLOOD CULTURE RECEIVED NO GROWTH TO DATE CULTURE WILL BE HELD FOR 5 DAYS BEFORE ISSUING A FINAL NEGATIVE REPORT     Performed at [facility]   Report Status PENDING   Incomplete  CULTURE, BLOOD (ROUTINE X 2)     Status: None   Collection Time    01/21/14  5:15 PM      Result Value Ref Range Status   Specimen Description BLOOD LEFT ARM   Final   Special Requests BOTTLES DRAWN AEROBIC AND ANAEROBIC 8CC   Final   Culture  Setup Time     Final   Value: 01/22/2014 01:50     Performed at [facility]   Culture     Final   Value:        BLOOD CULTURE RECEIVED NO GROWTH TO DATE CULTURE WILL BE HELD FOR 5 DAYS BEFORE ISSUING A FINAL NEGATIVE REPORT     Performed at [facility]   Report Status PENDING   Incomplete  URINE CULTURE     Status: None   Collection Time    01/22/14  5:20 AM      Result Value Ref Range Status   Specimen Description URINE, RANDOM   Final   Special Requests NONE   Final   Culture  Setup Time     Final   Value: 01/22/2014 08:40     Performed at [facility]     Final   Value: >=100,000 COLONIES/ML     Performed at [facility]   Culture     Final   Value: Multiple bacterial morphotypes present, none predominant. Suggest appropriate recollection if clinically indicated.     Performed at [facility]   Report Status 01/23/2014 FINAL   Final     Studies: Nm Myocar Multi W/spect W/wall Motion / Ef  01/24/2014   CLINICAL DATA:  Chest pain, history of cardiomyopathy of uncertain etiology. History of diabetes, hypertension, chronic stage IV renal disease  EXAM: MYOCARDIAL IMAGING WITH SPECT (REST AND PHARMACOLOGIC-STRESS - 2 DAY PROTOCOL)  GATED LEFT VENTRICULAR WALL MOTION  STUDY  LEFT VENTRICULAR EJECTION FRACTION  TECHNIQUE: Standard myocardial SPECT imaging was performed after resting intravenous injection of 10 mCi Tc-39m sestamibi. Subsequently, on a second day, intravenous infusion of Lexiscan was performed under the supervision of the Cardiology staff. At peak effect of the drug, 30 mCi Tc-63m sestamibi was injected intravenously and standard  myocardial SPECT imaging was performed. Quantitative gated imaging was also performed to evaluate left ventricular wall motion, and estimate left ventricular ejection fraction.  COMPARISON:  Chest radiograph - 09/01/2008  FINDINGS: Review of the rotational raw images demonstrates focal contamination involving the left chest wall and right buttocks on the rest images and contamination involving the right axilla and mid back on the stress images. There is mild breast attenuation artifact. No significant patient motion artifact  SPECT imaging demonstrates matched area of non perfusion involving the inferior wall of the left ventricle worrisome for prior infarction. This area of non perfusion involving the inferior wall were since on the provided stress images and osseus worrisome for an area of peri-infarct ischemia. Additional, there is mismatched decreased perfusion involving the anterior and lateral walls of the left ventricle on the provided stress images also worrisome for additional areas of pharmacologically induced ischemia.  Quantitative gated analysis demonstrates mild global hypokinesia without geographic wall motion abnormality.  The resting left ventricular ejection fraction is 123XX123 with end-diastolic volume of A999333 ml and end-systolic volume of 78 ml.  IMPRESSION: 1. Suspected prior infarction involving the inferior wall of the left ventricle with associated minimal amount of peri-infarct ischemia. 2. Suspected pharmacologically induced ischemia involving the anterior and lateral walls of the left ventricle. 3. Mild global  hypokinesia without geographic wall motion abnormality. Ejection fraction - 41%. These results will be called to the ordering clinician or representative by the Radiologist Assistant, and communication documented in the PACS or zVision Dashboard.   Electronically Signed   By: Sandi Mariscal M.D.   On: 01/24/2014 15:37    Scheduled Meds: . [START ON 01/26/2014] aspirin  81 mg Oral Pre-Cath  . aspirin EC  81 mg Oral Daily  . carvedilol  6.25 mg Oral BID WC  . enoxaparin (LOVENOX) injection  40 mg Subcutaneous Q24H  . insulin aspart  0-15 Units Subcutaneous TID WC  . insulin aspart  0-5 Units Subcutaneous QHS  . insulin detemir  20 Units Subcutaneous QHS  . meclizine  25 mg Oral TID  . nicotine  21 mg Transdermal Daily  . simvastatin  20 mg Oral q1800  . sodium chloride  3 mL Intravenous Q12H   Continuous Infusions: . [START ON 01/26/2014] sodium chloride         Time spent: 25 minutes    DHUNGEL, [place]  [facility] Pager [phone]. If 7PM-7AM, please contact night-coverage at [email or web address], password [email or web address] 01/25/2014, 11:09 AM  LOS: 4 days

## 2014-01-25 NOTE — Progress Notes (Signed)
    Subjective:  Denies CP or dyspnea; complains of dizziness   Objective:  Filed Vitals:   01/24/14 0503 01/24/14 1435 01/24/14 2216 01/25/14 0525  BP: 117/48 146/50 144/60 128/59  Pulse: 68 79 80 78  Temp: 98.3 F (36.8 C) 97.3 F (36.3 C) 97.5 F (36.4 C) 97.8 F (36.6 C)  TempSrc: Oral Oral Oral Oral  Resp: 18 18 18 17   Height:      Weight: 198 lb 3.1 oz (89.9 kg)   198 lb 13.7 oz (90.2 kg)  SpO2: 96% 100% 100% 97%    Intake/Output from previous day:  Intake/Output Summary (Last 24 hours) at 01/25/14 0723 Last data filed at 01/25/14 0700  Gross per 24 hour  Intake    360 ml  Output    900 ml  Net   -540 ml    Physical Exam: Physical exam: Well-developed well-nourished in no acute distress.  Skin is warm and dry.  HEENT is normal.  Neck is supple.  Chest is clear to auscultation with normal expansion.  Cardiovascular exam is regular rate and rhythm.  Abdominal exam nontender or distended. No masses palpated. Extremities show trace edema. neuro grossly intact    Lab Results: Basic Metabolic Panel:  Recent Labs  01/23/14 1045 01/24/14 0334  NA 139 141  K 4.6 4.2  CL 102 103  CO2 26 25  GLUCOSE 189* 150*  BUN 46* 50*  CREATININE 2.13* 2.09*  CALCIUM 8.3* 8.0*   CBC: No results found for this basename: WBC, NEUTROABS, HGB, HCT, MCV, PLT,  in the last 72 hours Cardiac Enzymes: No results found for this basename: CKTOTAL, CKMB, CKMBINDEX, TROPONINI,  in the last 72 hours   Assessment/Plan:  1 cardiomyopathy-etiology unclear. Given approximate 25 year history of diabetes mellitus coronary artery disease certainly possible. Nuclear study shows EF 41, inferior infarct and probable ischemia in anterior lateral wall. She is high risk for contrast nephropathy; however, given CM, risk factors and abnormal nuclear study, feel definitive evaluation warranted; risks and benefits of cath discussed and she agrees to proceed; plan to hydrate beginning tomorrow  evening; hold lasix; cath [place] if renal function stable; limit dye; no vgram; follow renal function closely following procedure; hold ARB for now. Hypertension could also be contributing. Plan to continue aspirin and statin. Continue carvedilol 6.25 mg twice a day. Further medication titration as an outpatient. 2 chronic stage IV renal failure-follow renal function closely post cath. Would suggest nephrology to follow given pending cath Monday. 3 hypertension-blood pressure has improved. Follow and adjust medications as needed. 4 vertigo/dizziness-further workup per primary care. 5 diabetes mellitus-follow CBG.  Kirk Ruths 01/25/2014, 7:23 AM

## 2014-01-26 DIAGNOSIS — I2589 Other forms of chronic ischemic heart disease: Secondary | ICD-10-CM

## 2014-01-26 LAB — CBC
HEMATOCRIT: 32.1 % — AB (ref 36.0–46.0)
HEMOGLOBIN: 9.9 g/dL — AB (ref 12.0–15.0)
MCH: 27.3 pg (ref 26.0–34.0)
MCHC: 30.8 g/dL (ref 30.0–36.0)
MCV: 88.7 fL (ref 78.0–100.0)
Platelets: 251 10*3/uL (ref 150–400)
RBC: 3.62 MIL/uL — ABNORMAL LOW (ref 3.87–5.11)
RDW: 14.4 % (ref 11.5–15.5)
WBC: 3.5 10*3/uL — ABNORMAL LOW (ref 4.0–10.5)

## 2014-01-26 LAB — BASIC METABOLIC PANEL
BUN: 52 mg/dL — AB (ref 6–23)
CHLORIDE: 102 meq/L (ref 96–112)
CO2: 26 meq/L (ref 19–32)
Calcium: 7.8 mg/dL — ABNORMAL LOW (ref 8.4–10.5)
Creatinine, Ser: 1.88 mg/dL — ABNORMAL HIGH (ref 0.50–1.10)
GFR calc Af Amer: 30 mL/min — ABNORMAL LOW (ref >90)
GFR calc non Af Amer: 26 mL/min — ABNORMAL LOW (ref >90)
Glucose, Bld: 97 mg/dL (ref 70–99)
POTASSIUM: 4.7 meq/L (ref 3.7–5.3)
Sodium: 139 mEq/L (ref 137–147)

## 2014-01-26 LAB — PROTIME-INR
INR: 1.11 (ref 0.00–1.49)
Prothrombin Time: 14.1 seconds (ref 11.6–15.2)

## 2014-01-26 LAB — GLUCOSE, CAPILLARY
GLUCOSE-CAPILLARY: 83 mg/dL (ref 70–99)
GLUCOSE-CAPILLARY: 73 mg/dL (ref 70–99)
GLUCOSE-CAPILLARY: 307 mg/dL — AB (ref 70–99)
Glucose-Capillary: 230 mg/dL — ABNORMAL HIGH (ref 70–99)
Glucose-Capillary: 59 mg/dL — ABNORMAL LOW (ref 70–99)
Glucose-Capillary: 59 mg/dL — ABNORMAL LOW (ref 70–99)

## 2014-01-26 MED ORDER — MECLIZINE HCL 12.5 MG PO TABS
12.5000 mg | ORAL_TABLET | Freq: Three times a day (TID) | ORAL | Status: DC | PRN
  Filled 2014-01-26: qty 1

## 2014-01-26 NOTE — Progress Notes (Signed)
Discussed with Dr Jonnie Finner ( renal) about fluid management to prevent Contrast induced nephropathy post cath. Agree with current fluid order of isotonic saline precath  ( 0.9% NS @1ml / kg/hr). Should receive 12 hrs post cath. Lasix and ARB already discontinued and renal function slightly better.

## 2014-01-26 NOTE — Progress Notes (Signed)
    Subjective:  "Indigestion" last evening; no chest pain this AM; no dyspnea.   Objective:  Filed Vitals:   01/25/14 0525 01/25/14 1300 01/25/14 2049 01/26/14 0439  BP: 128/59 134/52 126/45 120/42  Pulse: 78 77 71 69  Temp: 97.8 F (36.6 C) 98.2 F (36.8 C) 97.8 F (36.6 C) 98.3 F (36.8 C)  TempSrc: Oral Oral Oral Oral  Resp: 17 18 18 18   Height:      Weight: 198 lb 13.7 oz (90.2 kg)   201 lb 4.5 oz (91.3 kg)  SpO2: 97% 100% 100% 95%    Intake/Output from previous day:  Intake/Output Summary (Last 24 hours) at 01/26/14 0703 Last data filed at 01/26/14 0640  Gross per 24 hour  Intake 556.67 ml  Output      0 ml  Net 556.67 ml    Physical Exam: Physical exam: Well-developed well-nourished in no acute distress.  Skin is warm and dry.  HEENT is normal.  Neck is supple.  Chest is clear to auscultation with normal expansion.  Cardiovascular exam is regular rate and rhythm.  Abdominal exam nontender or distended. No masses palpated. Extremities show no edema. neuro grossly intact    Lab Results: Basic Metabolic Panel:  Recent Labs  01/25/14 1520 01/26/14 0530  NA 140 139  K 4.7 4.7  CL 102 102  CO2 27 26  GLUCOSE 148* 97  BUN 50* 52*  CREATININE 1.88* 1.88*  CALCIUM 8.1* 7.8*   CBC:  Recent Labs  01/26/14 0530  WBC 3.5*  HGB 9.9*  HCT 32.1*  MCV 88.7  PLT 251     Assessment/Plan:  1 cardiomyopathy-etiology unclear. Given approximate 25 year history of diabetes mellitus coronary artery disease certainly possible. Nuclear study shows EF 41, inferior infarct and probable ischemia in anterior lateral wall. She is high risk for contrast nephropathy; however, given CM, risk factors and abnormal nuclear study, feel definitive evaluation warranted; risks and benefits of cath discussed and she agrees to proceed; hold lasix and ARB; gentle hydration today; cath [place] if renal function back to baseline; limit dye; no vgram; follow renal function closely  following procedure. Hypertension could also be contributing to CM. Plan to continue aspirin and statin. Continue carvedilol 6.25 mg twice a day. Further medication titration as an outpatient. Resume ARB once renal function stable. 2 chronic stage IV renal failure-follow renal function closely post cath. 3 hypertension-blood pressure has improved. Follow and adjust medications as needed. 4 vertigo/dizziness-further workup per primary care. 5 diabetes mellitus-follow CBG.  Kathleen [name] 01/26/2014, 7:03 AM

## 2014-01-26 NOTE — Progress Notes (Signed)
[facility] PROGRESS NOTE  EMMANI CHADHA [id number] DOB: 1946-02-13 DOA: 01/21/2014 PCP: Benito Mccreedy, MD  Assessment/Plan: Benign positional vertigo Symptoms improving with meclizine. Incidental finding of left parietal lobe meningioma which needs followup with MRI with contrast in 6 months.  Dilated / ischemic Cardiomyopathy  patient on presentation had elevated proBNP. No signs of volume overload noted. A 2-D echo was done part of workup for TIA and showed significantly reduced EF of 25-30% with grade 1 diastolic dysfunction.  -No clinical signs of failure. Started on aspirin, carvedilol. Continued Lasix and ARB. Cardiology consulted and a stress test done on 6/18 suggestive of ischemic cardiomyopathy with EF of 41% with inferior infarct and possible ischemia of anterior lateral wall. - cardiology plan on cardiac cath on 6/22 with minimum dye. Patient is at high risk for contrast-induced nephropathy. Discontinued Lasix and ARB.  -Added statin for hyperlipidemia . Counseled on medication compliance and lifestyle modification  Uncontrolled HTN  Currently stable. Continue coreg  Diabetes mellitus  A1c of 7.2. Hypoglycemic episode on 6/18 . Reduced levemir dose to 20 units.   CKD  Cr of 1.88 today.. Likely related to diabetic nephropathy and uncontrolled HTN. Hold ARB and Lasix. As per PCP, pts creatinine in dec 2014 was 1.76. Reports following with Dr Florene Glen.  -plan for cath with minimum dye load.  has high risk for contrast-induced nephropathy. Will discuss with renal on fluid choices to prevent CIN.  Tobacco use  Counseled on cessation . On nicotine patch    HPI/Subjective: Feels better overall. No overnight issues  Objective: Filed Vitals:   01/26/14 0439  BP: 120/42  Pulse: 69  Temp: 98.3 F (36.8 C)  Resp: 18    Intake/Output Summary (Last 24 hours) at 01/26/14 1043 Last data filed at 01/26/14 0640  Gross per 24 hour  Intake 436.67 ml  Output      0  ml  Net 436.67 ml   Filed Weights   01/24/14 0503 01/25/14 0525 01/26/14 0439  Weight: 89.9 kg (198 lb 3.1 oz) 90.2 kg (198 lb 13.7 oz) 91.3 kg (201 lb 4.5 oz)    Exam:  General: Elderly obese female in no acute distress  HEENT: No pallor, moist oral mucosa, no JVD  Cardiovascular: Normal S1-S2, no murmurs rub or gallop  Respiratory: Clear bilaterally  Abdomen: Soft, nontender, nondistended, bowel sounds present  Musculoskeletal: Warm, no edema  CNS: AAO x3, no focal deficit    Data Reviewed: Basic Metabolic Panel:  Recent Labs Lab 01/22/14 0453 01/23/14 1045 01/24/14 0334 01/25/14 1520 01/26/14 0530  NA 142 139 141 140 139  K 4.5 4.6 4.2 4.7 4.7  CL 105 102 103 102 102  CO2 24 26 25 27 26   GLUCOSE 76 189* 150* 148* 97  BUN 40* 46* 50* 50* 52*  CREATININE 2.05* 2.13* 2.09* 1.88* 1.88*  CALCIUM 8.6 8.3* 8.0* 8.1* 7.8*   Liver Function Tests: No results found for this basename: AST, ALT, ALKPHOS, BILITOT, PROT, ALBUMIN,  in the last 168 hours No results found for this basename: LIPASE, AMYLASE,  in the last 168 hours No results found for this basename: AMMONIA,  in the last 168 hours CBC:  Recent Labs Lab 01/21/14 1020 01/21/14 1025 01/26/14 0530  WBC 4.5  --  3.5*  HGB 11.5* 13.3  13.3 9.9*  HCT 36.4 39.0  39.0 32.1*  MCV 89.2  --  88.7  PLT 249  --  251   Cardiac Enzymes:  Recent Labs Lab 01/21/14 1020  01/21/14 1732 01/21/14 2305 01/22/14 0500  TROPONINI <0.30 <0.30 <0.30 <0.30   BNP (last 3 results)  Recent Labs  01/21/14 1020 01/21/14 1732  PROBNP 3351.0* 4302.0*   CBG:  Recent Labs Lab 01/24/14 0023 01/24/14 0429 01/24/14 0741 01/25/14 1719 01/25/14 2207  GLUCAP 177* 139* 85 125* 169*    Recent Results (from the past 240 hour(s))  CULTURE, BLOOD (ROUTINE X 2)     Status: None   Collection Time    01/21/14  4:34 PM      Result Value Ref Range Status   Specimen Description BLOOD RIGHT HAND   Final   Special Requests BOTTLES  DRAWN AEROBIC ONLY 3CC   Final   Culture  Setup Time     Final   Value: 01/22/2014 01:49     Performed at [facility]   Culture     Final   Value:        BLOOD CULTURE RECEIVED NO GROWTH TO DATE CULTURE WILL BE HELD FOR 5 DAYS BEFORE ISSUING A FINAL NEGATIVE REPORT     Performed at [facility]   Report Status PENDING   Incomplete  CULTURE, BLOOD (ROUTINE X 2)     Status: None   Collection Time    01/21/14  5:15 PM      Result Value Ref Range Status   Specimen Description BLOOD LEFT ARM   Final   Special Requests BOTTLES DRAWN AEROBIC AND ANAEROBIC 8CC   Final   Culture  Setup Time     Final   Value: 01/22/2014 01:50     Performed at [facility]   Culture     Final   Value:        BLOOD CULTURE RECEIVED NO GROWTH TO DATE CULTURE WILL BE HELD FOR 5 DAYS BEFORE ISSUING A FINAL NEGATIVE REPORT     Performed at [facility]   Report Status PENDING   Incomplete  URINE CULTURE     Status: None   Collection Time    01/22/14  5:20 AM      Result Value Ref Range Status   Specimen Description URINE, RANDOM   Final   Special Requests NONE   Final   Culture  Setup Time     Final   Value: 01/22/2014 08:40     Performed at [facility]     Final   Value: >=100,000 COLONIES/ML     Performed at [facility]   Culture     Final   Value: Multiple bacterial morphotypes present, none predominant. Suggest appropriate recollection if clinically indicated.     Performed at [facility]   Report Status 01/23/2014 FINAL   Final     Studies: Nm Myocar Multi W/spect W/wall Motion / Ef  01/24/2014   CLINICAL DATA:  Chest pain, history of cardiomyopathy of uncertain etiology. History of diabetes, hypertension, chronic stage IV renal disease  EXAM: MYOCARDIAL IMAGING WITH SPECT (REST AND PHARMACOLOGIC-STRESS - 2 DAY PROTOCOL)  GATED LEFT VENTRICULAR WALL MOTION STUDY  LEFT VENTRICULAR EJECTION FRACTION  TECHNIQUE: Standard myocardial  SPECT imaging was performed after resting intravenous injection of 10 mCi Tc-28m sestamibi. Subsequently, on a second day, intravenous infusion of Lexiscan was performed under the supervision of the Cardiology staff. At peak effect of the drug, 30 mCi Tc-42m sestamibi was injected intravenously and standard myocardial SPECT imaging was performed. Quantitative gated imaging was also performed to evaluate left ventricular  wall motion, and estimate left ventricular ejection fraction.  COMPARISON:  Chest radiograph - 09/01/2008  FINDINGS: Review of the rotational raw images demonstrates focal contamination involving the left chest wall and right buttocks on the rest images and contamination involving the right axilla and mid back on the stress images. There is mild breast attenuation artifact. No significant patient motion artifact  SPECT imaging demonstrates matched area of non perfusion involving the inferior wall of the left ventricle worrisome for prior infarction. This area of non perfusion involving the inferior wall were since on the provided stress images and osseus worrisome for an area of peri-infarct ischemia. Additional, there is mismatched decreased perfusion involving the anterior and lateral walls of the left ventricle on the provided stress images also worrisome for additional areas of pharmacologically induced ischemia.  Quantitative gated analysis demonstrates mild global hypokinesia without geographic wall motion abnormality.  The resting left ventricular ejection fraction is 123XX123 with end-diastolic volume of A999333 ml and end-systolic volume of 78 ml.  IMPRESSION: 1. Suspected prior infarction involving the inferior wall of the left ventricle with associated minimal amount of peri-infarct ischemia. 2. Suspected pharmacologically induced ischemia involving the anterior and lateral walls of the left ventricle. 3. Mild global hypokinesia without geographic wall motion abnormality. Ejection fraction - 41%.  These results will be called to the ordering clinician or representative by the Radiologist Assistant, and communication documented in the PACS or zVision Dashboard.   Electronically Signed   By: Sandi Mariscal M.D.   On: 01/24/2014 15:37    Scheduled Meds: . [START ON 01/27/2014] aspirin  81 mg Oral Pre-Cath  . aspirin EC  81 mg Oral Daily  . carvedilol  6.25 mg Oral BID WC  . enoxaparin (LOVENOX) injection  40 mg Subcutaneous Q24H  . insulin aspart  0-15 Units Subcutaneous TID WC  . insulin aspart  0-5 Units Subcutaneous QHS  . insulin detemir  20 Units Subcutaneous QHS  . meclizine  25 mg Oral TID  . nicotine  21 mg Transdermal Daily  . simvastatin  20 mg Oral q1800  . sodium chloride  3 mL Intravenous Q12H   Continuous Infusions: . [START ON 01/27/2014] sodium chloride         Time spent: 25 minutes    DHUNGEL, [place]  [facility] Pager [phone]. If 7PM-7AM, please contact night-coverage at [email or web address], password [facility] 01/26/2014, 10:43 AM  LOS: 5 days

## 2014-01-27 ENCOUNTER — Encounter (HOSPITAL_COMMUNITY)
Admission: EM | Disposition: A | Discharge status: Home Health | Payer: Self-pay | Source: Home / Self Care | Attending: Internal Medicine

## 2014-01-27 DIAGNOSIS — N179 Acute kidney failure, unspecified: Secondary | ICD-10-CM

## 2014-01-27 DIAGNOSIS — E1129 Type 2 diabetes mellitus with other diabetic kidney complication: Secondary | ICD-10-CM

## 2014-01-27 DIAGNOSIS — R42 Dizziness and giddiness: Secondary | ICD-10-CM | POA: Diagnosis not present

## 2014-01-27 DIAGNOSIS — H8110 Benign paroxysmal vertigo, unspecified ear: Secondary | ICD-10-CM | POA: Diagnosis not present

## 2014-01-27 DIAGNOSIS — N183 Chronic kidney disease, stage 3 unspecified: Secondary | ICD-10-CM

## 2014-01-27 DIAGNOSIS — I2510 Atherosclerotic heart disease of native coronary artery without angina pectoris: Secondary | ICD-10-CM

## 2014-01-27 HISTORY — PX: LEFT HEART CATHETERIZATION WITH CORONARY ANGIOGRAM: SHX5451

## 2014-01-27 LAB — BASIC METABOLIC PANEL
BUN: 52 mg/dL — ABNORMAL HIGH (ref 6–23)
CALCIUM: 8.2 mg/dL — AB (ref 8.4–10.5)
CO2: 26 meq/L (ref 19–32)
CREATININE: 1.86 mg/dL — AB (ref 0.50–1.10)
Chloride: 103 mEq/L (ref 96–112)
GFR calc Af Amer: 31 mL/min — ABNORMAL LOW (ref >90)
GFR calc non Af Amer: 27 mL/min — ABNORMAL LOW (ref >90)
GLUCOSE: 111 mg/dL — AB (ref 70–99)
Potassium: 4.9 mEq/L (ref 3.7–5.3)
SODIUM: 141 meq/L (ref 137–147)

## 2014-01-27 LAB — GLUCOSE, CAPILLARY
GLUCOSE-CAPILLARY: 122 mg/dL — AB (ref 70–99)
GLUCOSE-CAPILLARY: 215 mg/dL — AB (ref 70–99)
GLUCOSE-CAPILLARY: 142 mg/dL — AB (ref 70–99)
Glucose-Capillary: 71 mg/dL (ref 70–99)
Glucose-Capillary: 131 mg/dL — ABNORMAL HIGH (ref 70–99)
Glucose-Capillary: 130 mg/dL — ABNORMAL HIGH (ref 70–99)
Glucose-Capillary: 93 mg/dL (ref 70–99)
Glucose-Capillary: 77 mg/dL (ref 70–99)
Glucose-Capillary: 80 mg/dL (ref 70–99)
Glucose-Capillary: 343 mg/dL — ABNORMAL HIGH (ref 70–99)

## 2014-01-27 SURGERY — LEFT HEART CATHETERIZATION WITH CORONARY ANGIOGRAM
Anesthesia: LOCAL

## 2014-01-27 MED ORDER — SODIUM CHLORIDE 0.9 % IV SOLN: INTRAVENOUS | Status: AC

## 2014-01-27 MED ORDER — HYDRALAZINE HCL 20 MG/ML IJ SOLN
INTRAMUSCULAR | Status: AC
  Filled 2014-01-27: qty 1

## 2014-01-27 MED ORDER — LIDOCAINE HCL (PF) 1 % IJ SOLN
INTRAMUSCULAR | Status: AC
  Filled 2014-01-27: qty 30

## 2014-01-27 MED ORDER — ACETAMINOPHEN 325 MG PO TABS
650.0000 mg | ORAL_TABLET | ORAL | Status: DC | PRN
  Administered 2014-01-27: 23:00:00 650 mg via ORAL
  Filled 2014-01-27: qty 2

## 2014-01-27 MED ORDER — VERAPAMIL HCL 2.5 MG/ML IV SOLN
INTRAVENOUS | Status: AC
  Filled 2014-01-27: qty 2

## 2014-01-27 MED ORDER — METOPROLOL TARTRATE 1 MG/ML IV SOLN
5.0000 mg | Freq: Once | INTRAVENOUS | Status: AC
  Administered 2014-01-27: 5 mg via INTRAVENOUS

## 2014-01-27 MED ORDER — MIDAZOLAM HCL 2 MG/2ML IJ SOLN
INTRAMUSCULAR | Status: AC
  Filled 2014-01-27: qty 2

## 2014-01-27 MED ORDER — METOPROLOL TARTRATE 1 MG/ML IV SOLN
INTRAVENOUS | Status: AC
  Filled 2014-01-27: qty 5

## 2014-01-27 MED ORDER — ASPIRIN 81 MG PO CHEW
81.0000 mg | CHEWABLE_TABLET | Freq: Every day | ORAL | Status: DC
  Administered 2014-01-28 – 2014-01-30 (×3): 81 mg via ORAL
  Filled 2014-01-27 (×3): qty 1

## 2014-01-27 MED ORDER — HEPARIN (PORCINE) IN NACL 2-0.9 UNIT/ML-% IJ SOLN
INTRAMUSCULAR | Status: AC
  Filled 2014-01-27: qty 1500

## 2014-01-27 MED ORDER — FENTANYL CITRATE 0.05 MG/ML IJ SOLN
INTRAMUSCULAR | Status: AC
  Filled 2014-01-27: qty 2

## 2014-01-27 MED ORDER — NITROGLYCERIN 0.2 MG/ML ON CALL CATH LAB
INTRAVENOUS | Status: AC
  Filled 2014-01-27: qty 1

## 2014-01-27 NOTE — Progress Notes (Signed)
[facility] PROGRESS NOTE  Kathleen [name] [id number] DOB: 08/02/1946 DOA: 01/21/2014 PCP: Benito Mccreedy, MD   Brief narrative 68 y/o obese female with hx of DM, smoker, HTN, CKD ( possibly diabetic nephropathy) presented with vertigo. W/up for TIA showed significant cardiomyopathy with ischemia on stress test.   Assessment/Plan: Benign positional vertigo Symptoms improving with meclizine. Incidental finding of left parietal lobe meningioma which needs followup with MRI with contrast in 6 months.  Dilated / ischemic Cardiomyopathy  patient on presentation had elevated proBNP. No signs of volume overload noted. A 2-D echo was done part of workup for TIA and showed significantly reduced EF of 25-30% with grade 1 diastolic dysfunction.  -No clinical signs of failure. Started on aspirin, carvedilol. Continued Lasix and ARB. Cardiology consulted and a stress test done on 6/18 suggestive of ischemic cardiomyopathy with EF of 41% with inferior infarct and possible ischemia of anterior lateral wall. - scheduled  cardiac cath today with minimum dye. Patient is at high risk for contrast-induced nephropathy. Discontinued Lasix and ARB and getting IV NS precath, plan to continue hydration 12 hrs post cath.  Monitor renal fn daily -Added statin for hyperlipidemia . Counseled on medication compliance and lifestyle modification  Uncontrolled HTN  Currently stable. Continue coreg  Diabetes mellitus  A1c of 7.2. Hypoglycemic episodes while in hospital.. Reduced levemir dose to 20 units.   CKD   Likely related to diabetic nephropathy and uncontrolled HTN. Hold ARB and Lasix. Cr of 1.86 today.. As per PCP, pts creatinine in dec 2014 was 1.76. Reports following with Dr Florene Glen.  -plan for cath with minimum dye load.  has high risk for contrast-induced nephropathy. Will need to monitor renal fn at least 48 hrs in the hospital post cath  Tobacco use  Counseled on cessation . On nicotine patch     HPI/Subjective:  No overnight issues  Objective: Filed Vitals:   01/27/14 1330  BP: 167/84  Pulse: 71  Temp: 97.5 F (36.4 C)  Resp: 18    Intake/Output Summary (Last 24 hours) at 01/27/14 1404 Last data filed at 01/27/14 0900  Gross per 24 hour  Intake 708.24 ml  Output      0 ml  Net 708.24 ml   Filed Weights   01/25/14 0525 01/26/14 0439 01/27/14 0500  Weight: 90.2 kg (198 lb 13.7 oz) 91.3 kg (201 lb 4.5 oz) 90.946 kg (200 lb 8 oz)    Exam:  General: Elderly obese female in no acute distress  HEENT: No pallor, moist oral mucosa, no JVD  Cardiovascular: Normal S1-S2, no murmurs rub or gallop  Respiratory: Clear bilaterally  Abdomen: Soft, nontender, nondistended, bowel sounds present  Musculoskeletal: Warm, no edema  CNS: AAO x3, no focal deficit    Data Reviewed: Basic Metabolic Panel:  Recent Labs Lab 01/23/14 1045 01/24/14 0334 01/25/14 1520 01/26/14 0530 01/27/14 0453  NA 139 141 140 139 141  K 4.6 4.2 4.7 4.7 4.9  CL 102 103 102 102 103  CO2 26 25 27 26 26   GLUCOSE 189* 150* 148* 97 111*  BUN 46* 50* 50* 52* 52*  CREATININE 2.13* 2.09* 1.88* 1.88* 1.86*  CALCIUM 8.3* 8.0* 8.1* 7.8* 8.2*   Liver Function Tests: No results found for this basename: AST, ALT, ALKPHOS, BILITOT, PROT, ALBUMIN,  in the last 168 hours No results found for this basename: LIPASE, AMYLASE,  in the last 168 hours No results found for this basename: AMMONIA,  in the last 168 hours CBC:  Recent  Labs Lab 01/21/14 1020 01/21/14 1025 01/26/14 0530  WBC 4.5  --  3.5*  HGB 11.5* 13.3  13.3 9.9*  HCT 36.4 39.0  39.0 32.1*  MCV 89.2  --  88.7  PLT 249  --  251   Cardiac Enzymes:  Recent Labs Lab 01/21/14 1020 01/21/14 1732 01/21/14 2305 01/22/14 0500  TROPONINI <0.30 <0.30 <0.30 <0.30   BNP (last 3 results)  Recent Labs  01/21/14 1020 01/21/14 1732  PROBNP 3351.0* 4302.0*   CBG:  Recent Labs Lab 01/26/14 1627 01/26/14 1712 01/26/14 2139  01/27/14 0745 01/27/14 1130  GLUCAP 59* 73 307* 130* 93    Recent Results (from the past 240 hour(s))  CULTURE, BLOOD (ROUTINE X 2)     Status: None   Collection Time    01/21/14  4:34 PM      Result Value Ref Range Status   Specimen Description BLOOD RIGHT HAND   Final   Special Requests BOTTLES DRAWN AEROBIC ONLY 3CC   Final   Culture  Setup Time     Final   Value: 01/22/2014 01:49     Performed at [facility]   Culture     Final   Value:        BLOOD CULTURE RECEIVED NO GROWTH TO DATE CULTURE WILL BE HELD FOR 5 DAYS BEFORE ISSUING A FINAL NEGATIVE REPORT     Performed at [facility]   Report Status PENDING   Incomplete  CULTURE, BLOOD (ROUTINE X 2)     Status: None   Collection Time    01/21/14  5:15 PM      Result Value Ref Range Status   Specimen Description BLOOD LEFT ARM   Final   Special Requests BOTTLES DRAWN AEROBIC AND ANAEROBIC 8CC   Final   Culture  Setup Time     Final   Value: 01/22/2014 01:50     Performed at [facility]   Culture     Final   Value:        BLOOD CULTURE RECEIVED NO GROWTH TO DATE CULTURE WILL BE HELD FOR 5 DAYS BEFORE ISSUING A FINAL NEGATIVE REPORT     Performed at [facility]   Report Status PENDING   Incomplete  URINE CULTURE     Status: None   Collection Time    01/22/14  5:20 AM      Result Value Ref Range Status   Specimen Description URINE, RANDOM   Final   Special Requests NONE   Final   Culture  Setup Time     Final   Value: 01/22/2014 08:40     Performed at [place]     Final   Value: >=100,000 COLONIES/ML     Performed at [facility]   Culture     Final   Value: Multiple bacterial morphotypes present, none predominant. Suggest appropriate recollection if clinically indicated.     Performed at [facility]   Report Status 01/23/2014 FINAL   Final     Studies: No results found.  Scheduled Meds: . aspirin EC  81 mg Oral Daily  . carvedilol   6.25 mg Oral BID WC  . enoxaparin (LOVENOX) injection  40 mg Subcutaneous Q24H  . insulin aspart  0-15 Units Subcutaneous TID WC  . insulin aspart  0-5 Units Subcutaneous QHS  . nicotine  21 mg Transdermal Daily  . simvastatin  20 mg Oral q1800  .  sodium chloride  3 mL Intravenous Q12H   Continuous Infusions: . sodium chloride 1 mL/kg/hr (01/27/14 0448)       Time spent: 25 minutes    DHUNGEL, Gentry  [facility] Pager [phone]. If 7PM-7AM, please contact night-coverage at [email or web address], password [facility] 01/27/2014, 2:04 PM  LOS: 6 days

## 2014-01-27 NOTE — Progress Notes (Signed)
Subjective: Deneis CP or SOB   Objective: Filed Vitals:   01/26/14 1424 01/26/14 2136 01/27/14 0500 01/27/14 0612  BP: 134/47 116/48  159/99  Pulse: 73 69  75  Temp: 98.1 F (36.7 C) 98.1 F (36.7 C)  98 F (36.7 C)  TempSrc: Oral Oral  Oral  Resp: 18 24  20   Height:      Weight:   200 lb 8 oz (90.946 kg)   SpO2: 97% 95%  96%   Weight change: -12.5 oz (-0.354 kg)  Intake/Output Summary (Last 24 hours) at 01/27/14 [id number] Last data filed at 01/27/14 0600  Gross per 24 hour  Intake 708.24 ml  Output      0 ml  Net 708.24 ml    General: Alert, awake, oriented x3, in no acute distress Neck:  JVP is normal Heart: Regular rate and rhythm, without murmurs, rubs, gallops.  Lungs: Clear to auscultation.  No rales or wheezes. Exemities:  No edema.   Neuro: Grossly intact, nonfocal.  Tele:  SR   Lab Results: Results for orders placed during the hospital encounter of 01/21/14 (from the past 24 hour(s))  GLUCOSE, CAPILLARY     Status: None   Collection Time    01/26/14  7:26 AM      Result Value Ref Range   Glucose-Capillary 83  70 - 99 mg/dL   Comment 1 Documented in Chart     Comment 2 Notify RN    GLUCOSE, CAPILLARY     Status: Abnormal   Collection Time    01/26/14 12:00 PM      Result Value Ref Range   Glucose-Capillary 230 (*) 70 - 99 mg/dL   Comment 1 Documented in Chart     Comment 2 Notify RN    GLUCOSE, CAPILLARY     Status: Abnormal   Collection Time    01/26/14  4:25 PM      Result Value Ref Range   Glucose-Capillary 59 (*) 70 - 99 mg/dL   Comment 1 Documented in Chart     Comment 2 Notify RN     Comment 3 Hypo Protocol    GLUCOSE, CAPILLARY     Status: Abnormal   Collection Time    01/26/14  4:27 PM      Result Value Ref Range   Glucose-Capillary 59 (*) 70 - 99 mg/dL   Comment 1 Documented in Chart     Comment 2 Notify RN     Comment 3 Hypo Protocol    GLUCOSE, CAPILLARY     Status: None   Collection Time    01/26/14  5:12 PM      Result Value Ref  Range   Glucose-Capillary 73  70 - 99 mg/dL  GLUCOSE, CAPILLARY     Status: Abnormal   Collection Time    01/26/14  9:39 PM      Result Value Ref Range   Glucose-Capillary 307 (*) 70 - 99 mg/dL  BASIC METABOLIC PANEL     Status: Abnormal   Collection Time    01/27/14  4:53 AM      Result Value Ref Range   Sodium 141  137 - 147 mEq/L   Potassium 4.9  3.7 - 5.3 mEq/L   Chloride 103  96 - 112 mEq/L   CO2 26  19 - 32 mEq/L   Glucose, Bld 111 (*) 70 - 99 mg/dL   BUN 52 (*) 6 - 23 mg/dL   Creatinine, Ser 1.86 (*) 0.50 -  1.10 mg/dL   Calcium 8.2 (*) 8.4 - 10.5 mg/dL   GFR calc non Af Amer 27 (*) >90 mL/min   GFR calc Af Amer 31 (*) >90 mL/min    Studies/Results: No results found.  Medications: Reviewed   @PROBHOSP @  1.  Cardiomyopathe.  Unknown cause.  Myoview with depressed LVEF, inferior infarct and prob anterolateral ischemia.  Cr is back to baseline .  WOuld continue hydration.  Cath today with minimal contrast.    2.  Renal  Follow closely post cath.  3.  HTN  Fair. High at times  Follow after cath  4.  DM.   LOS: 6 days   Dorris Carnes 01/27/2014, 7:12 AM

## 2014-01-27 NOTE — Progress Notes (Signed)
15:15:00   Received via [facility]. Painfree. BP 214/105     HR 76 NSR without ectopy. O2 sat 99% on room air. NSL right AC intact. 0.9% NS @ 50cc/hr left FA. Dr. Irish Lack aware of blood pressure. Orders received. 15:30:00  BP 206/93 HR 78 15:50:00 BP 204/90 HR 68 16:05:00 BP 195/90 HR 70 Sleeping.   16:30:00 Up tp bathroom with assistance. BP when back to stretcher 222/106 HR 88. Taken to cath lab #9 by Harrison Mons. Report give.

## 2014-01-27 NOTE — Progress Notes (Signed)
Writer just received message that pt will not be returning to [facility]. Will stay at [facility] Alerted MD.

## 2014-01-27 NOTE — Interval H&P Note (Signed)
Cath Lab Visit (complete for each Cath Lab visit)  Clinical Evaluation Leading to the Procedure:   ACS: yes  Non-ACS:    Anginal Classification: CCS IV  Anti-ischemic medical therapy: Minimal Therapy (1 class of medications)  Non-Invasive Test Results: High-risk stress test findings: cardiac mortality >3%/year  Prior CABG: No previous CABG      History and Physical Interval Note:  01/27/2014 4:50 PM  Kathleen [name]  has presented today for surgery, with the diagnosis of chest pain  The various methods of treatment have been discussed with the patient and family. After consideration of risks, benefits and other options for treatment, the patient has consented to  Procedure(s): LEFT HEART CATHETERIZATION WITH CORONARY ANGIOGRAM (N/A) as a surgical intervention .  The patient's history has been reviewed, patient examined, no change in status, stable for surgery.  I have reviewed the patient's chart and labs.  Questions were answered to the patient's satisfaction.     VARANASI,JAYADEEP S.

## 2014-01-27 NOTE — Progress Notes (Signed)
TR BAND REMOVAL  LOCATION:    right radial  DEFLATED PER PROTOCOL:    yes  TIME BAND OFF / DRESSING APPLIED:    2100   SITE UPON ARRIVAL:    Level 0  SITE AFTER BAND REMOVAL:    Level 0  REVERSE ALLEN'S TEST:     Positive per pleth rt thumb  CIRCULATION SENSATION AND MOVEMENT:    Within Normal Limits   yes  COMMENTS:   Written post radial cath instructions given and reviewed w/ pt.  Questions answered. BP 161/74.  Tele shows ST 100-105, coreg given since has not received today.  Curt Bears Schorr PA notifed of c/o headache, orders received.  Lights off, tylenol given.

## 2014-01-27 NOTE — Progress Notes (Signed)
Pt left with [facility] headed to Cath Lab.

## 2014-01-27 NOTE — H&P (View-Only) (Signed)
Subjective: Deneis CP or SOB   Objective: Filed Vitals:   01/26/14 1424 01/26/14 2136 01/27/14 0500 01/27/14 0612  BP: 134/47 116/48  159/99  Pulse: 73 69  75  Temp: 98.1 F (36.7 C) 98.1 F (36.7 C)  98 F (36.7 C)  TempSrc: Oral Oral  Oral  Resp: 18 24  20   Height:      Weight:   200 lb 8 oz (90.946 kg)   SpO2: 97% 95%  96%   Weight change: -12.5 oz (-0.354 kg)  Intake/Output Summary (Last 24 hours) at 01/27/14 [id number] Last data filed at 01/27/14 0600  Gross per 24 hour  Intake 708.24 ml  Output      0 ml  Net 708.24 ml    General: Alert, awake, oriented x3, in no acute distress Neck:  JVP is normal Heart: Regular rate and rhythm, without murmurs, rubs, gallops.  Lungs: Clear to auscultation.  No rales or wheezes. Exemities:  No edema.   Neuro: Grossly intact, nonfocal.  Tele:  SR   Lab Results: Results for orders placed during the hospital encounter of 01/21/14 (from the past 24 hour(s))  GLUCOSE, CAPILLARY     Status: None   Collection Time    01/26/14  7:26 AM      Result Value Ref Range   Glucose-Capillary 83  70 - 99 mg/dL   Comment 1 Documented in Chart     Comment 2 Notify RN    GLUCOSE, CAPILLARY     Status: Abnormal   Collection Time    01/26/14 12:00 PM      Result Value Ref Range   Glucose-Capillary 230 (*) 70 - 99 mg/dL   Comment 1 Documented in Chart     Comment 2 Notify RN    GLUCOSE, CAPILLARY     Status: Abnormal   Collection Time    01/26/14  4:25 PM      Result Value Ref Range   Glucose-Capillary 59 (*) 70 - 99 mg/dL   Comment 1 Documented in Chart     Comment 2 Notify RN     Comment 3 Hypo Protocol    GLUCOSE, CAPILLARY     Status: Abnormal   Collection Time    01/26/14  4:27 PM      Result Value Ref Range   Glucose-Capillary 59 (*) 70 - 99 mg/dL   Comment 1 Documented in Chart     Comment 2 Notify RN     Comment 3 Hypo Protocol    GLUCOSE, CAPILLARY     Status: None   Collection Time    01/26/14  5:12 PM      Result Value Ref  Range   Glucose-Capillary 73  70 - 99 mg/dL  GLUCOSE, CAPILLARY     Status: Abnormal   Collection Time    01/26/14  9:39 PM      Result Value Ref Range   Glucose-Capillary 307 (*) 70 - 99 mg/dL  BASIC METABOLIC PANEL     Status: Abnormal   Collection Time    01/27/14  4:53 AM      Result Value Ref Range   Sodium 141  137 - 147 mEq/L   Potassium 4.9  3.7 - 5.3 mEq/L   Chloride 103  96 - 112 mEq/L   CO2 26  19 - 32 mEq/L   Glucose, Bld 111 (*) 70 - 99 mg/dL   BUN 52 (*) 6 - 23 mg/dL   Creatinine, Ser 1.86 (*) 0.50 -  1.10 mg/dL   Calcium 8.2 (*) 8.4 - 10.5 mg/dL   GFR calc non Af Amer 27 (*) >90 mL/min   GFR calc Af Amer 31 (*) >90 mL/min    Studies/Results: No results found.  Medications: Reviewed   @PROBHOSP @  1.  Cardiomyopathe.  Unknown cause.  Myoview with depressed LVEF, inferior infarct and prob anterolateral ischemia.  Cr is back to baseline .  WOuld continue hydration.  Cath today with minimal contrast.    2.  Renal  Follow closely post cath.  3.  HTN  Fair. High at times  Follow after cath  4.  DM.   LOS: 6 days   Dorris Carnes 01/27/2014, 7:12 AM

## 2014-01-27 NOTE — CV Procedure (Addendum)
       PROCEDURE:  Left heart catheterization with selective coronary angiography.  INDICATIONS:  Cardiomyopathy, chest discomfort concerning for unstable angina  The risks, benefits, and details of the procedure were explained to the patient.  The patient verbalized understanding and wanted to proceed.  Informed written consent was obtained.  PROCEDURE TECHNIQUE:  After Xylocaine anesthesia a 6F slender sheath was placed in the right radial artery with a single anterior needle wall stick.   IV heparin was given. Right coronary angiography was attempted using a Judkins R4 guide catheter. This was unsuccessful. A Williams right guiding catheter was used. Left coronary angiography was done using a Judkins L3.5 guide catheter.  Left ventriculography was not done to minimize contrast exposure. Left heart catheterization was done with the [facility] right catheter.  A TR band was used for hemostasis.   CONTRAST:  Total of 50 cc.  COMPLICATIONS:  None.    HEMODYNAMICS:  Aortic pressure was 96/43; LV pressure was 94/6; LVEDP 10.  There was no gradient between the left ventricle and aorta.    ANGIOGRAPHIC DATA:   The left main coronary artery is widely patent.  The left anterior descending artery is a large vessel which reaches the apex. There is mild, diffuse atherosclerosis. There does not appear to be any significant obstructive disease. There several medium-size diagonals. These are all patent. From the mid LAD, there is a large diagonal which originates and is widely patent.  The left circumflex artery is a large vessel. There is a medium-sized first obtuse marginal which is patent. The second and third obtuse marginal vessels are large with mild atherosclerosis. At the distal circumflex, there is a small followed by a larger medium-sized obtuse marginal. These are widely patent.  The right coronary artery is a large, codominant vessel. In the mid vessel, there is moderate to severe diffuse  atherosclerosis, up to 70%. Past this area, there is moderate atherosclerosis with a focal 50% lesion.  LEFT VENTRICULOGRAM:  Left ventricular angiogram was not done.  LVEDP was 10 mmHg.  IMPRESSIONS:  1. Widely patent left main coronary artery. 2. Mild, nonobstructive disease in the left anterior descending artery and its branches. 3. Mild to moderate, nonobstructive disease in the left circumflex artery and its branches. 4. Moderate to severe disease in the mid right coronary artery.   5. Left ventricular systolic function not assessed.  LVEDP 10 mmHg.    RECOMMENDATION:  Based on the echocardiogram, her LV dysfunction is use. Her LV dysfunction is out of proportion to the degree of coronary artery disease that she has. There is no clear indication for revascularization, especially given her renal dysfunction. Continue medical therapy for cardiomyopathy.  The inferolateral defect on her stress test may be related to the RCA disease. It is not a straightforward lesion. Would favor medical therapy.   F/U with Dr. Johnsie Cancel.

## 2014-01-27 NOTE — Progress Notes (Signed)
PT Cancellation Note  Patient Details Name: Kathleen [name] MRN: [id number] DOB: [birth year]   Cancelled Treatment:    Reason Eval/Treat Not Completed: Patient at procedure or test/unavailable (going for cardiac cath)   Claretha Cooper 01/27/2014, 10:43 AM [name] PT [phone]

## 2014-01-28 LAB — HEPATIC FUNCTION PANEL
ALBUMIN: 2.9 g/dL — AB (ref 3.5–5.2)
ALT: 9 U/L (ref 0–35)
AST: 19 U/L (ref 0–37)
Alkaline Phosphatase: 109 U/L (ref 39–117)
Bilirubin, Direct: <0.2 mg/dL (ref 0.0–0.3)
Total Bilirubin: <0.2 mg/dL — ABNORMAL LOW (ref 0.3–1.2)
Total Protein: 6.3 g/dL (ref 6.0–8.3)

## 2014-01-28 LAB — BASIC METABOLIC PANEL
BUN: 49 mg/dL — AB (ref 6–23)
CALCIUM: 8.3 mg/dL — AB (ref 8.4–10.5)
CO2: 24 mEq/L (ref 19–32)
Chloride: 104 mEq/L (ref 96–112)
Creatinine, Ser: 1.82 mg/dL — ABNORMAL HIGH (ref 0.50–1.10)
GFR calc non Af Amer: 27 mL/min — ABNORMAL LOW (ref >90)
GFR, EST AFRICAN AMERICAN: 32 mL/min — AB (ref >90)
GLUCOSE: 218 mg/dL — AB (ref 70–99)
Potassium: 5.1 mEq/L (ref 3.7–5.3)
Sodium: 139 mEq/L (ref 137–147)

## 2014-01-28 LAB — CULTURE, BLOOD (ROUTINE X 2)
CULTURE: NO GROWTH
Culture: NO GROWTH

## 2014-01-28 LAB — GLUCOSE, CAPILLARY
Glucose-Capillary: 131 mg/dL — ABNORMAL HIGH (ref 70–99)
Glucose-Capillary: 186 mg/dL — ABNORMAL HIGH (ref 70–99)
Glucose-Capillary: 215 mg/dL — ABNORMAL HIGH (ref 70–99)
Glucose-Capillary: 110 mg/dL — ABNORMAL HIGH (ref 70–99)

## 2014-01-28 LAB — CBC
HCT: 34.5 % — ABNORMAL LOW (ref 36.0–46.0)
Hemoglobin: 10.7 g/dL — ABNORMAL LOW (ref 12.0–15.0)
MCH: 27.9 pg (ref 26.0–34.0)
MCHC: 31 g/dL (ref 30.0–36.0)
MCV: 90.1 fL (ref 78.0–100.0)
Platelets: 256 10*3/uL (ref 150–400)
RBC: 3.83 MIL/uL — ABNORMAL LOW (ref 3.87–5.11)
RDW: 14.4 % (ref 11.5–15.5)
WBC: 3.9 10*3/uL — AB (ref 4.0–10.5)

## 2014-01-28 LAB — IRON AND TIBC
Iron: 46 ug/dL (ref 42–135)
Saturation Ratios: 19 % — ABNORMAL LOW (ref 20–55)
TIBC: 244 ug/dL — AB (ref 250–470)
UIBC: 198 ug/dL (ref 125–400)

## 2014-01-28 LAB — VITAMIN B12: Vitamin B-12: 361 pg/mL (ref 211–911)

## 2014-01-28 LAB — FERRITIN: Ferritin: 101 ng/mL (ref 10–291)

## 2014-01-28 LAB — OCCULT BLOOD X 1 CARD TO LAB, STOOL: FECAL OCCULT BLD: NEGATIVE

## 2014-01-28 MED ORDER — ATORVASTATIN CALCIUM 40 MG PO TABS
40.0000 mg | ORAL_TABLET | Freq: Every day | ORAL | Status: DC
  Administered 2014-01-28 – 2014-01-29 (×2): 40 mg via ORAL
  Filled 2014-01-28 (×3): qty 1

## 2014-01-28 MED ORDER — HYDRALAZINE HCL 20 MG/ML IJ SOLN: 10.0000 mg | Freq: Four times a day (QID) | INTRAMUSCULAR | Status: DC | PRN

## 2014-01-28 NOTE — Progress Notes (Signed)
Physical Therapy Treatment Patient Details Name: Kathleen [name] MRN: [id number] DOB: [birth year] Today's Date: 01/28/2014    History of Present Illness Pt is a 68 year old female with PMHx of HTN, DM, and renal disorder admitted 6/16 for dizziness/lightheadedness and stroke/TIA workup.  MRI negative for acute infarct., but positive for L parietal lobe meningioma. Now s/p L heart cath 01/27/14 without significant need for revascularization.    PT Comments    Patient without signs of BPPV with testing today.  Feels much improved in terms of her dizziness.  Has some signs of possible central cause for symptoms with left parietal meningioma on MRI.  Agree with HHPT at d/c.  States plans to d/c to son's home.  Follow Up Recommendations  Home health PT     Equipment Recommendations  None recommended by PT (has walker at home)    Recommendations for Other Services       Precautions / Restrictions Precautions Precautions: Fall    Mobility  Bed Mobility Overal bed mobility: Modified Independent                Transfers Overall transfer level: Modified independent     Sit to Stand: Modified independent (Device/Increase time)            Ambulation/Gait Ambulation/Gait assistance: Min assist Ambulation Distance (Feet): 150 Feet Assistive device: 1 person hand held assist Gait Pattern/deviations: Step-through pattern;Wide base of support;Decreased stride length     General Gait Details: reports dizziness much improved; despite needing HHA for ambulation reports didn't feel she would have needed walker if she were at home.   Stairs            Wheelchair Mobility    Modified Rankin (Stroke Patients Only)       Balance             Standing balance-Leahy Scale: Fair Standing balance comment: wide base of support                    Cognition Arousal/Alertness: Awake/alert Behavior During Therapy: WFL for tasks assessed/performed Overall  Cognitive Status: Within Functional Limits for tasks assessed                      Exercises Other Exercises Other Exercises: tested for BPPV with sidelying hall pike without nystagmus or symptoms noted.  Did not VOR cancellation difficulties suggestive of possible central cause of dizziness symptoms.    General Comments        Pertinent Vitals/Pain No pain complaints    Home Living                      Prior Function            PT Goals (current goals can now be found in the care plan section) Progress towards PT goals: Progressing toward goals    Frequency  Min 3X/week    PT Plan Current plan remains appropriate    Co-evaluation             End of Session Equipment Utilized During Treatment: Gait belt Activity Tolerance: Patient tolerated treatment well Patient left: in bed;with call bell/phone within reach;with bed alarm set;with family/visitor present     Time: [id number] PT Time Calculation (min): 25 min  Charges:  $Gait Training: 8-22 mins $Therapeutic Activity: 8-22 mins                    G  Codes:      Kathleen [name],Kathleen [name] 01/28/2014, 5:09 PM Magda Kiel, [place] 01/28/2014

## 2014-01-28 NOTE — Care Management Note (Addendum)
    Page 1 of 2   01/30/2014     11:09:48 AM CARE MANAGEMENT NOTE 01/30/2014  Patient:  MAXI, LOUGEE   Account Number:  [id number]  Date Initiated:  01/24/2014  Documentation initiated by:  [facility]  Subjective/Objective Assessment:   benign paroxysmal positional vertigo     Action/Plan:   Anticipated DC Date:  01/30/2014   Anticipated DC Plan:  [place]  CM consult      [facility] Choice  HOME HEALTH   Choice offered to / List presented to:  C-1 Patient   DME arranged  [facility]assie Moselle      DME agency  [place] arranged  HH-1 RN  [place].   Status of service:  Completed, signed off Medicare Important Message given?  YES (If response is "NO", the following Medicare IM given date fields will be blank) Date Medicare IM given:  01/18/2014 Date Additional Medicare IM given:  01/28/2014  Discharge Disposition:  [place]  Per UR Regulation:  Reviewed for med. necessity/level of care/duration of stay  If discussed at [place] of Stay Meetings, dates discussed:    Comments:  01/30/14 1107 Tomi Bamberger RN, BSN [phone] patient for dc today, notified [facility] of dc, notifed Jermaine for rolling walker.  01/25/2014 1350 NCM spoke to pt and offered choice for [facility]. Pt requested [facility] for HH. Pt requested RW for home. Additional Medicare IM given. Jonnie Finner RN CCM Case Mgmt phone [phone]

## 2014-01-28 NOTE — Progress Notes (Signed)
Patient: Kathleen [name] / Admit Date: 01/21/2014 / Date of Encounter: 01/28/2014, 7:07 AM  Subjective  Feeling good. No CP or SOB. Denies bleeding. We discussed fluid/salt restriction and daily weights.  Objective   Telemetry: NSR  Physical Exam: Blood pressure 147/66, pulse 86, temperature 98.4 F (36.9 C), temperature source Oral, resp. rate 21, height 5\' 3"  (1.6 m), weight 200 lb 2.8 oz (90.8 kg), SpO2 98.00%. General: Well developed, well nourished overweight F in no acute distress. Head: Normocephalic, atraumatic, sclera non-icteric, no xanthomas, nares are without discharge. Neck: Negative for carotid bruits. JVP not elevated. Lungs: Clear bilaterally to auscultation without wheezes, rales, or rhonchi. Breathing is unlabored. Heart: RRR S1 S2 possible S4 no murmurs, rubs. Abdomen: Soft, non-tender, non-distended with normoactive bowel sounds. No rebound/guarding. Extremities: No clubbing or cyanosis. No edema. Distal pedal pulses are 2+ and equal bilaterally. R radial site without hematoma/ecchymosis, good pulse in tact Neuro: Alert and oriented X 3. Moves all extremities spontaneously. Psych:  Responds to questions appropriately with a normal affect.   Intake/Output Summary (Last 24 hours) at 01/28/14 0707 Last data filed at 01/28/14 0400  Gross per 24 hour  Intake 1461.6 ml  Output      0 ml  Net 1461.6 ml    Inpatient Medications:  . aspirin  81 mg Oral Daily  . atorvastatin  40 mg Oral q1800  . carvedilol  6.25 mg Oral BID WC  . insulin aspart  0-15 Units Subcutaneous TID WC  . insulin aspart  0-5 Units Subcutaneous QHS  . nicotine  21 mg Transdermal Daily   Infusions:    Labs:  Recent Labs  01/27/14 0453 01/28/14 0224  NA 141 139  K 4.9 5.1  CL 103 104  CO2 26 24  GLUCOSE 111* 218*  BUN 52* 49*  CREATININE 1.86* 1.82*  CALCIUM 8.2* 8.3*   No results found for this basename: AST, ALT, ALKPHOS, BILITOT, PROT, ALBUMIN,  in the last 72 hours  Recent  Labs  01/26/14 0530  WBC 3.5*  HGB 9.9*  HCT 32.1*  MCV 88.7  PLT 251   Radiology/Studies:  Ct Head Wo Contrast  01/21/2014   CLINICAL DATA:  Dizziness with nausea.  Possible dehydration.  EXAM: CT HEAD WITHOUT CONTRAST  TECHNIQUE: Contiguous axial images were obtained from the base of the skull through the vertex without intravenous contrast.  COMPARISON:  None.  FINDINGS: There is no evidence of acute intracranial hemorrhage, mass lesion, brain edema or extra-axial fluid collection. The ventricles and subarachnoid spaces are appropriately sized for age. There is no CT evidence of acute cortical infarction. Minimal fat density is noted along the interhemispheric fissure.  The visualized paranasal sinuses, mastoid air cells and middle ears are clear. The calvarium is intact.  IMPRESSION: Unremarkable noncontrast head CT.   Electronically Signed   By: Camie Patience M.D.   On: 01/21/2014 10:33   Mr Jodene Nam Head Wo Contrast  01/21/2014   CLINICAL DATA:  Weakness and dizziness. Not able to ambulate. Diabetic hypertensive patient with renal disorder.  EXAM: MRI HEAD WITHOUT CONTRAST  MRA HEAD WITHOUT CONTRAST  TECHNIQUE: Multiplanar, multiecho pulse sequences of the brain and surrounding structures were obtained without intravenous contrast. Angiographic images of the head were obtained using MRA technique without contrast.  COMPARISON:  01/21/2014 head CT.  No comparison brain MR.  FINDINGS: MRI HEAD FINDINGS  No acute infarct.  Left parietal region extra-axial lenticular shaped 3.5 x 2.8 x 1 cm lesion has an  appearance suggestive of a meningioma. No evidence to suggest this represents a posttraumatic blood collection. When this is evaluated on follow-up imaging, contrast enhanced imaging may be considered.  Mild small vessel disease type changes.  No hydrocephalus.  Major intracranial vascular structures are patent.  Exophthalmos.  C3-4 bulge with mild spinal stenosis.  Partially empty sella which does not  appear to be extended.  Cervical medullary junction unremarkable.  MRA HEAD FINDINGS  Mild narrowing supraclinoid segment of the internal carotid artery more notable on the left.  High grade focal stenosis A1 segment left anterior cerebral artery.  Mild narrowing A1 segment right anterior cerebral artery.  Mild to moderate narrowing distal M1 segment left middle cerebral artery.  Moderate to marked narrowing distal M1 segment right middle cerebral artery/right middle cerebral artery bifurcation.  Middle cerebral artery mild to moderate branch vessel irregularity bilaterally.  A2 segment anterior cerebral artery narrowing and irregularity greater on the right.  Ectatic vertebral arteries with mild to slightly moderate narrowing.  Full extent of the posterior inferior cerebellar arteries not imaged.  Mild to moderate narrowing of portions of the ectatic basilar artery.  Anterior circulation contributes to the posterior cerebral artery supply and therefore subsequent small vertebrobasilar system.  Nonvisualized anterior inferior cerebellar arteries and left superior cerebellar artery with narrowed right superior cerebral artery.  Moderate to marked regions of narrowing involving the portions of the posterior cerebral artery bilaterally.  No aneurysm detected on this slightly motion degraded exam.  IMPRESSION: MRI HEAD:  No acute infarct.  Left parietal region extra-axial lenticular shaped 3.5 x 2.8 x 1 cm lesion has an appearance suggestive of a meningioma. No evidence to suggest this represents a posttraumatic blood collection. When this is evaluated on follow-up imaging, contrast enhanced imaging may be considered.  Mild small vessel disease type changes.  Exophthalmos.  C3-4 bulge with mild spinal stenosis.  MRA HEAD:  Exam is motion degraded which may accentuate above described intracranial atherosclerotic type changes.   Electronically Signed   By: Chauncey Cruel M.D.   On: 01/21/2014 17:14   Mr Brain Wo  Contrast  01/21/2014   CLINICAL DATA:  Weakness and dizziness. Not able to ambulate. Diabetic hypertensive patient with renal disorder.  EXAM: MRI HEAD WITHOUT CONTRAST  MRA HEAD WITHOUT CONTRAST  TECHNIQUE: Multiplanar, multiecho pulse sequences of the brain and surrounding structures were obtained without intravenous contrast. Angiographic images of the head were obtained using MRA technique without contrast.  COMPARISON:  01/21/2014 head CT.  No comparison brain MR.  FINDINGS: MRI HEAD FINDINGS  No acute infarct.  Left parietal region extra-axial lenticular shaped 3.5 x 2.8 x 1 cm lesion has an appearance suggestive of a meningioma. No evidence to suggest this represents a posttraumatic blood collection. When this is evaluated on follow-up imaging, contrast enhanced imaging may be considered.  Mild small vessel disease type changes.  No hydrocephalus.  Major intracranial vascular structures are patent.  Exophthalmos.  C3-4 bulge with mild spinal stenosis.  Partially empty sella which does not appear to be extended.  Cervical medullary junction unremarkable.  MRA HEAD FINDINGS  Mild narrowing supraclinoid segment of the internal carotid artery more notable on the left.  High grade focal stenosis A1 segment left anterior cerebral artery.  Mild narrowing A1 segment right anterior cerebral artery.  Mild to moderate narrowing distal M1 segment left middle cerebral artery.  Moderate to marked narrowing distal M1 segment right middle cerebral artery/right middle cerebral artery bifurcation.  Middle cerebral artery mild  to moderate branch vessel irregularity bilaterally.  A2 segment anterior cerebral artery narrowing and irregularity greater on the right.  Ectatic vertebral arteries with mild to slightly moderate narrowing.  Full extent of the posterior inferior cerebellar arteries not imaged.  Mild to moderate narrowing of portions of the ectatic basilar artery.  Anterior circulation contributes to the posterior  cerebral artery supply and therefore subsequent small vertebrobasilar system.  Nonvisualized anterior inferior cerebellar arteries and left superior cerebellar artery with narrowed right superior cerebral artery.  Moderate to marked regions of narrowing involving the portions of the posterior cerebral artery bilaterally.  No aneurysm detected on this slightly motion degraded exam.  IMPRESSION: MRI HEAD:  No acute infarct.  Left parietal region extra-axial lenticular shaped 3.5 x 2.8 x 1 cm lesion has an appearance suggestive of a meningioma. No evidence to suggest this represents a posttraumatic blood collection. When this is evaluated on follow-up imaging, contrast enhanced imaging may be considered.  Mild small vessel disease type changes.  Exophthalmos.  C3-4 bulge with mild spinal stenosis.  MRA HEAD:  Exam is motion degraded which may accentuate above described intracranial atherosclerotic type changes.   Electronically Signed   By: Chauncey Cruel M.D.   On: 01/21/2014 17:14   Nm Myocar Multi W/spect W/wall Motion / Ef  01/24/2014   CLINICAL DATA:  Chest pain, history of cardiomyopathy of uncertain etiology. History of diabetes, hypertension, chronic stage IV renal disease  EXAM: MYOCARDIAL IMAGING WITH SPECT (REST AND PHARMACOLOGIC-STRESS - 2 DAY PROTOCOL)  GATED LEFT VENTRICULAR WALL MOTION STUDY  LEFT VENTRICULAR EJECTION FRACTION  TECHNIQUE: Standard myocardial SPECT imaging was performed after resting intravenous injection of 10 mCi Tc-79m sestamibi. Subsequently, on a second day, intravenous infusion of Lexiscan was performed under the supervision of the Cardiology staff. At peak effect of the drug, 30 mCi Tc-85m sestamibi was injected intravenously and standard myocardial SPECT imaging was performed. Quantitative gated imaging was also performed to evaluate left ventricular wall motion, and estimate left ventricular ejection fraction.  COMPARISON:  Chest radiograph - 09/01/2008  FINDINGS: Review of the  rotational raw images demonstrates focal contamination involving the left chest wall and right buttocks on the rest images and contamination involving the right axilla and mid back on the stress images. There is mild breast attenuation artifact. No significant patient motion artifact  SPECT imaging demonstrates matched area of non perfusion involving the inferior wall of the left ventricle worrisome for prior infarction. This area of non perfusion involving the inferior wall were since on the provided stress images and osseus worrisome for an area of peri-infarct ischemia. Additional, there is mismatched decreased perfusion involving the anterior and lateral walls of the left ventricle on the provided stress images also worrisome for additional areas of pharmacologically induced ischemia.  Quantitative gated analysis demonstrates mild global hypokinesia without geographic wall motion abnormality.  The resting left ventricular ejection fraction is [id number] with end-diastolic volume of [id number] ml and end-systolic volume of 78 ml.  IMPRESSION: 1. Suspected prior infarction involving the inferior wall of the left ventricle with associated minimal amount of peri-infarct ischemia. 2. Suspected pharmacologically induced ischemia involving the anterior and lateral walls of the left ventricle. 3. Mild global hypokinesia without geographic wall motion abnormality. Ejection fraction - 41%. These results will be called to the ordering clinician or representative by the Radiologist Assistant, and communication documented in the PACS or zVision Dashboard.   Electronically Signed   By: Sandi Mariscal M.D.   On: 01/24/2014 15:37  Assessment and Plan  1. Mixed ischemic and nonischemic cardiomyopathy (out of proportion to CAD) EF 25-30% by echo, newly dx this adm - she was on ARB prior to admission. We may be able to restart this in the next day or so since Cr is stable post cath. Continue BB. Will need reassessment of EF in 3 months to  determine if she would meet criteria for ICD. 2. CAD - mild in LAD, mild-mod LCx, mod-severe RCA - favor medical therapy. Continue aspirin, beta blocker, statin. 3. HTN - improved. 4. Hyperlipidemia - change to Lipitor given renal insufficiency and LDL 173. Add baseline LFTs onto today's labs. 5. CKD stage IV - Cr stable post-cath. MD to advise on when to recheck if necessary. 6. Anemia - Drop from 11-13 range to 9.9. Denies bleeding. ? Further per IM. 7. Vertigo with incidental meningioma on imaging - per IM. 8. Obesity Body mass index is 35.47 kg/(m^2). - lifestyle mod. 9. Diabetes mellitus - favor d/c Actos indefinitely given LV dysfunction.   Signed, Melina Copa PA-C  I have examined the patient and reviewed assessment and plan and discussed with patient.  Agree with above as stated.  Continue medical therapy.  VARANASI,JAYADEEP S.

## 2014-01-28 NOTE — Progress Notes (Signed)
[facility] PROGRESS NOTE  GANA GILGENBACH [id number] DOB: 1945-09-16 DOA: 01/21/2014 PCP: Benito Mccreedy, MD   Brief narrative 68 y/o obese female with hx of DM, smoker, HTN, CKD ( possibly diabetic nephropathy) presented with vertigo. W/up for TIA showed significant cardiomyopathy with ischemia on stress test.   Assessment/Plan:  ischemic Cardiomyopathy  patient on presentation had elevated proBNP. No signs of volume overload noted. A 2-D echo was done part of workup for TIA and showed significantly reduced EF of 25-30% with grade 1 diastolic dysfunction.  -No clinical signs of failure. Started on aspirin, carvedilol. Continued on  Lasix and ARB. Cardiology consulted and a stress test done on 6/18 suggestive of ischemic cardiomyopathy with EF of 41% with inferior infarct and possible ischemia of anterior lateral wall. - scheduled  cardiac cath with minimum dye showing the following: 1. Widely patent left main coronary artery.  2. Mild, nonobstructive disease in the left anterior descending artery and its branches.  3. Mild to moderate, nonobstructive disease in the left circumflex artery and its branches. Moderate to severe disease in the mid right coronary artery. Left ventricular systolic function not assessed. LVEDP 10 mmHg.   [name] Patient is at high risk for contrast-induced nephropathy. Discontinued Lasix and ARB and received IVNS pre and post precath. Monitor renal fn in am .  -Added statin for hyperlipidemia . Counseled on medication compliance and lifestyle modification. Patient will need lasix and ARB but can be held for a week until renal fn checked as outpt is stable.  Benign positional vertigo Symptoms improving with meclizine. Incidental finding of left parietal lobe meningioma which needs followup with MRI with contrast in 6 months.    Uncontrolled HTN  Currently stable. Continue coreg. Will add prn hydralazine   Diabetes mellitus  A1c of 7.2. Hypoglycemic  episodes while in hospital.. Reduced levemir dose to 20 units.   CKD   Likely related to diabetic nephropathy and uncontrolled HTN. Hold ARB and Lasix. Cr of 1.82 today.. As per PCP, pts creatinine in dec 2014 was 1.76. Reports following with Dr Florene Glen.  -cardiac cath on 6/22 with minimum dye load.  has high risk for contrast-induced nephropathy. Will need to monitor renal fn at least 48 hrs in the hospital post cath.  If stable in am can be discharged home with PCP follow up within next 3-4 days to check renal fn. Needs outpt follow up with renal as well.    Tobacco use  Counseled on cessation . On nicotine patch   Anemia  likelty due to chronic disease. Fecal occult blood negative. Iron  panel suggests ACD  Diet: cardiac/ diabetic  DVT prophylaxis: sq heparin  dispo Home possibly on 6/24 if renal fn stable. Will HHPT  DVT prophylaxis Sq heparin     HPI/Subjective: S/p cath on 6/22. tolerated well  Objective: Filed Vitals:   01/28/14 1230  BP: 184/68  Pulse: 84  Temp: 98.4 F (36.9 C)  Resp: 18    Intake/Output Summary (Last 24 hours) at 01/28/14 1303 Last data filed at 01/28/14 0400  Gross per 24 hour  Intake 1461.6 ml  Output      0 ml  Net 1461.6 ml   Filed Weights   01/26/14 0439 01/27/14 0500 01/28/14 0024  Weight: 91.3 kg (201 lb 4.5 oz) 90.946 kg (200 lb 8 oz) 90.8 kg (200 lb 2.8 oz)    Exam:  General: Elderly obese female in no acute distress  HEENT: No pallor, moist oral mucosa, no JVD  Cardiovascular: Normal S1-S2, no murmurs rub or gallop  Respiratory: Clear bilaterally  Abdomen: Soft, nontender, nondistended, bowel sounds present  Musculoskeletal: Warm, no edema , cath site clean, no hematoma CNS: AAO x3, no focal deficit    Data Reviewed: Basic Metabolic Panel:  Recent Labs Lab 01/24/14 0334 01/25/14 1520 01/26/14 0530 01/27/14 0453 01/28/14 0224  NA 141 140 139 141 139  K 4.2 4.7 4.7 4.9 5.1  CL 103 102 102 103 104  CO2 25 27  26 26 24   GLUCOSE 150* 148* 97 111* 218*  BUN 50* 50* 52* 52* 49*  CREATININE 2.09* 1.88* 1.88* 1.86* 1.82*  CALCIUM 8.0* 8.1* 7.8* 8.2* 8.3*   Liver Function Tests:  Recent Labs Lab 01/28/14 0224  AST 19  ALT 9  ALKPHOS 109  BILITOT <0.2*  PROT 6.3  ALBUMIN 2.9*   No results found for this basename: LIPASE, AMYLASE,  in the last 168 hours No results found for this basename: AMMONIA,  in the last 168 hours CBC:  Recent Labs Lab 01/26/14 0530 01/28/14 0927  WBC 3.5* 3.9*  HGB 9.9* 10.7*  HCT 32.1* 34.5*  MCV 88.7 90.1  PLT 251 256   Cardiac Enzymes:  Recent Labs Lab 01/21/14 1732 01/21/14 2305 01/22/14 0500  TROPONINI <0.30 <0.30 <0.30   BNP (last 3 results)  Recent Labs  01/21/14 1020 01/21/14 1732  PROBNP 3351.0* 4302.0*   CBG:  Recent Labs Lab 01/27/14 1526 01/27/14 1632 01/27/14 2208 01/28/14 0804 01/28/14 1228  GLUCAP 77 80 343* 131* 186*    Recent Results (from the past 240 hour(s))  CULTURE, BLOOD (ROUTINE X 2)     Status: None   Collection Time    01/21/14  4:34 PM      Result Value Ref Range Status   Specimen Description BLOOD RIGHT HAND   Final   Special Requests BOTTLES DRAWN AEROBIC ONLY 3CC   Final   Culture  Setup Time     Final   Value: 01/22/2014 01:49     Performed at [facility]   Culture     Final   Value: NO GROWTH 5 DAYS     Performed at [facility]   Report Status 01/28/2014 FINAL   Final  CULTURE, BLOOD (ROUTINE X 2)     Status: None   Collection Time    01/21/14  5:15 PM      Result Value Ref Range Status   Specimen Description BLOOD LEFT ARM   Final   Special Requests BOTTLES DRAWN AEROBIC AND ANAEROBIC 8CC   Final   Culture  Setup Time     Final   Value: 01/22/2014 01:50     Performed at [facility]   Culture     Final   Value: NO GROWTH 5 DAYS     Performed at [facility]   Report Status 01/28/2014 FINAL   Final  URINE CULTURE     Status: None   Collection Time     01/22/14  5:20 AM      Result Value Ref Range Status   Specimen Description URINE, RANDOM   Final   Special Requests NONE   Final   Culture  Setup Time     Final   Value: 01/22/2014 08:40     Performed at [place]     Final   Value: >=100,000 COLONIES/ML     Performed at [facility]  Final   Value: Multiple bacterial morphotypes present, none predominant. Suggest appropriate recollection if clinically indicated.     Performed at [facility]   Report Status 01/23/2014 FINAL   Final     Studies: No results found.  Scheduled Meds: . aspirin  81 mg Oral Daily  . atorvastatin  40 mg Oral q1800  . carvedilol  6.25 mg Oral BID WC  . insulin aspart  0-15 Units Subcutaneous TID WC  . insulin aspart  0-5 Units Subcutaneous QHS  . nicotine  21 mg Transdermal Daily   Continuous Infusions:       Time spent: 25 minutes    DHUNGEL, [place]  [facility] Pager [phone]. If 7PM-7AM, please contact night-coverage at [email or web address], password [facility] 01/28/2014, 1:03 PM  LOS: 7 days

## 2014-01-29 LAB — BASIC METABOLIC PANEL
BUN: 53 mg/dL — ABNORMAL HIGH (ref 6–23)
CALCIUM: 9 mg/dL (ref 8.4–10.5)
CO2: 22 mEq/L (ref 19–32)
Chloride: 102 mEq/L (ref 96–112)
Creatinine, Ser: 2.2 mg/dL — ABNORMAL HIGH (ref 0.50–1.10)
GFR calc Af Amer: 25 mL/min — ABNORMAL LOW (ref >90)
GFR, EST NON AFRICAN AMERICAN: 22 mL/min — AB (ref >90)
Glucose, Bld: 148 mg/dL — ABNORMAL HIGH (ref 70–99)
POTASSIUM: 4.9 meq/L (ref 3.7–5.3)
SODIUM: 141 meq/L (ref 137–147)

## 2014-01-29 LAB — GLUCOSE, CAPILLARY
GLUCOSE-CAPILLARY: 273 mg/dL — AB (ref 70–99)
Glucose-Capillary: 133 mg/dL — ABNORMAL HIGH (ref 70–99)
Glucose-Capillary: 197 mg/dL — ABNORMAL HIGH (ref 70–99)

## 2014-01-29 MED ORDER — HYDRALAZINE HCL 10 MG PO TABS
10.0000 mg | ORAL_TABLET | Freq: Three times a day (TID) | ORAL | Status: DC
  Administered 2014-01-29 – 2014-01-30 (×4): 10 mg via ORAL
  Filled 2014-01-29 (×6): qty 1

## 2014-01-29 MED ORDER — DORZOLAMIDE HCL-TIMOLOL MAL 2-0.5 % OP SOLN
1.0000 [drp] | Freq: Two times a day (BID) | OPHTHALMIC | Status: DC
  Administered 2014-01-29 – 2014-01-30 (×2): 1 [drp] via OPHTHALMIC
  Filled 2014-01-29: qty 10

## 2014-01-29 MED ORDER — ISOSORBIDE MONONITRATE ER 30 MG PO TB24
30.0000 mg | ORAL_TABLET | Freq: Every day | ORAL | Status: DC
  Administered 2014-01-29 – 2014-01-30 (×2): 30 mg via ORAL
  Filled 2014-01-29 (×2): qty 1

## 2014-01-29 MED ORDER — HEPARIN SODIUM (PORCINE) 5000 UNIT/ML IJ SOLN
5000.0000 [IU] | Freq: Three times a day (TID) | INTRAMUSCULAR | Status: DC
  Administered 2014-01-29 – 2014-01-30 (×3): 5000 [IU] via SUBCUTANEOUS
  Filled 2014-01-29 (×5): qty 1

## 2014-01-29 NOTE — Plan of Care (Signed)
Problem: Food- and Nutrition-Related Knowledge Deficit (NB-1.1) Goal: Nutrition education Formal process to instruct or train a patient/client in a skill or to impart knowledge to help patients/clients voluntarily manage or modify food choices and eating behavior to maintain or improve health. Outcome: Completed/Met Date Met:  01/29/14  RD consulted for nutrition education regarding diabetes/CHF.   Pt reports drinking regular soda and only eating 2 meals per day.     Lab Results  Component Value Date    HGBA1C 7.2* 01/21/2014    RD provided "Carbohydrate Counting for People with Diabetes" handout from the Academy of Nutrition and Dietetics. Discussed different food groups and their effects on blood sugar, emphasizing carbohydrate-containing foods. Provided list of carbohydrates and recommended serving sizes of common foods.  Discussed importance of controlled and consistent carbohydrate intake throughout the day. Provided examples of ways to balance meals/snacks and encouraged intake of high-fiber, whole grain complex carbohydrates.   RD provided "Low Sodium Nutrition Therapy" handout from the Academy of Nutrition and Dietetics. Reviewed patient's dietary recall. Provided examples on ways to decrease sodium intake in diet. Discouraged intake of processed foods and use of salt shaker. Encouraged fresh fruits and vegetables as well as whole grain sources of carbohydrates to maximize fiber intake.   RD discussed why it is important for patient to adhere to diet recommendations, and emphasized the role of fluids, foods to avoid, and importance of weighing self daily.   Body mass index is 35.74 kg/(m^2). Pt meets criteria for obesity class II based on current BMI.  Teach back method used.  Expect good compliance.  Body mass index is 35.74 kg/(m^2). Pt meets criteria for Obesity Class II based on current BMI.  Labs and medications reviewed. No further nutrition interventions warranted at this  time. RD contact information provided. If additional nutrition issues arise, please re-consult RD.  [place], [place], [place] Pager [phone] After Hours Pager

## 2014-01-29 NOTE — Progress Notes (Signed)
NURSING PROGRESS NOTE  Kathleen [name] [id number] Transfer Data: 01/29/2014 6:17 PM Attending Provider: Cherene Altes, MD [id number], MD Code Status: FUll  Kathleen [name] is a 68 y.o. female patient transferred from 6c -No acute distress noted.  -No complaints of shortness of breath.  -No complaints of chest pain.    Blood pressure 160/81, pulse 72, temperature 98.2 F (36.8 C), temperature source Oral, resp. rate 18, height 5\' 3"  (1.6 m), weight 91.5 kg (201 lb 11.5 oz), SpO2 98.00%.   IV Fluids:  IV in place, occlusive dsg intact without redness, IV cath to Right F/A, SL.  Allergies:  Review of patient's allergies indicates no known allergies.  Past Medical History:   has a past medical history of Diabetes mellitus without complication; Renal disorder; and Hypertension.  Past Surgical History:   has past surgical history that includes Abdominal hysterectomy; Breast surgery; and Tonsillectomy.  Social History:   reports that she has been smoking Cigarettes.  She has a 7.5 pack-year smoking history. She has never used smokeless tobacco. She reports that she does not drink alcohol or use illicit drugs.  Skin: Blisters noted to lower legs, otherwise Skin intact.  Patient/Family orientated to room. Information packet given to patient/family. Admission inpatient armband information verified with patient/family to include name and date of birth and placed on patient arm. Side rails up x 2, fall assessment and education completed with patient/family. Patient/family able to verbalize understanding of risk associated with falls and verbalized understanding to call for assistance before getting out of bed. Call light within reach. Patient/family able to voice and demonstrate understanding of unit orientation instructions.    Will continue to evaluate and treat per MD orders.

## 2014-01-29 NOTE — Progress Notes (Signed)
[place] TEAM 1 - Stepdown/ICU TEAM Progress Note  Kathleen [name] [id number] DOB: 1946-03-04 DOA: 01/21/2014 PCP: Benito Mccreedy, MD  Admit HPI / Brief Narrative: 68 y/o obese female with hx of DM, smoker, HTN, CKD (possibly diabetic nephropathy) presented with vertigo. W/up for TIA showed significant cardiomyopathy with ischemia on stress test.  HPI/Subjective: Pt has no complaints today.  Denies cp, n/v, sob, or ha.  She states her dizziness is much improved.    Assessment/Plan:  Ischemic Cardiomyopathy  -patient on presentation had elevated proBNP. No signs of volume overload noted. A 2-D echo was done part of workup for TIA and showed significantly reduced EF of 25-30% with grade 1 diastolic dysfunction.  -No clinical signs of failure. Started on aspirin, carvedilol. Continued on Lasix and ARB. Cardiology consulted and a stress test done on 6/18 suggestive of ischemic cardiomyopathy with EF of 41% with inferior infarct and possible ischemia of anterior lateral wall.  - scheduled cardiac cath with minimum dye showing the following:  1. Widely patent left main coronary artery.  2. Mild, nonobstructive disease in the left anterior descending artery and its branches.  3. Mild to moderate, nonobstructive disease in the left circumflex artery and its branches. Moderate to severe disease in the mid right coronary artery. Left ventricular systolic function not assessed. LVEDP 10 mmHg.  -Patient is at high risk for contrast-induced nephropathy. Discontinued Lasix and ARB and received IVNS pre and post precath.  -crt is currently climbing, and is highest it has been during this admit today (6/24) -Added statin for hyperlipidemia . Counseled on medication compliance and lifestyle modification. Patient will need lasix and hydralazine/nitrate until clear if ACE/ARB is safe to use   CKD  Likely related to diabetic nephropathy and uncontrolled HTN. Hold ARB and Lasix. Cr of 1.82 today.. As  per PCP, pts creatinine in dec 2014 was 1.76. Reports following with Dr Florene Glen.  -cardiac cath on 6/22 with minimum dye load. has high risk for contrast-induced nephropathy. Will need to monitor renal fn again in AM as crt is presently climbing  -If crt stable or improved in am can be discharged home with PCP follow up within next 3-4 days to check renal fn. Needs outpt follow up with renal as well.   Benign positional vertigo  Symptoms improving with meclizine. Incidental finding of left parietal lobe meningioma which needs followup with MRI with contrast in 6 months.   Left parietal lobe meningioma Incidental finding on MRI - needs f/u MRI w/ contrast in 6 months    Uncontrolled HTN  BP remains poorly controlled, with peak of 188 thus far today - adjust tx and follow   Diabetes mellitus  A1c of 7.2. Hypoglycemic episodes while in hospital.. Reduced levemir dose to 20 units. CBG reasonable at present - no change in tx plan today   Tobacco use  Counseled on cessation . On nicotine patch   Anemia  likelty due to chronic disease. Fecal occult blood negative. Iron panel suggests ACD   Code Status: FULL Family Communication: no family present at time of exam Disposition Plan: transfer to medical bed - potential d/c home 6/25 if crt and BP improved  Consultants: Cardiology   Antibiotics: none  DVT prophylaxis: SQ heparin   Objective: Blood pressure 188/73, pulse 85, temperature 98.4 F (36.9 C), temperature source Oral, resp. rate 20, height 5\' 3"  (1.6 m), weight 91.5 kg (201 lb 11.5 oz), SpO2 97.00%.  Intake/Output Summary (Last 24 hours) at 01/29/14 1320 Last data  filed at 01/28/14 1831  Gross per 24 hour  Intake    320 ml  Output      0 ml  Net    320 ml   Exam: General: No acute respiratory distress Lungs: Clear to auscultation bilaterally without wheezes or crackles Cardiovascular: Regular rate and rhythm without murmur gallop or rub normal S1 and S2 Abdomen:  Nontender, nondistended, soft, bowel sounds positive, no rebound, no ascites, no appreciable mass Extremities: No significant cyanosis, clubbing, or edema bilateral lower extremities  Data Reviewed: Basic Metabolic Panel:  Recent Labs Lab 01/25/14 1520 01/26/14 0530 01/27/14 0453 01/28/14 0224 01/29/14 0406  NA 140 139 141 139 141  K 4.7 4.7 4.9 5.1 4.9  CL 102 102 103 104 102  CO2 27 26 26 24 22   GLUCOSE 148* 97 111* 218* 148*  BUN 50* 52* 52* 49* 53*  CREATININE 1.88* 1.88* 1.86* 1.82* 2.20*  CALCIUM 8.1* 7.8* 8.2* 8.3* 9.0   Liver Function Tests:  Recent Labs Lab 01/28/14 0224  AST 19  ALT 9  ALKPHOS 109  BILITOT <0.2*  PROT 6.3  ALBUMIN 2.9*   CBC:  Recent Labs Lab 01/26/14 0530 01/28/14 0927  WBC 3.5* 3.9*  HGB 9.9* 10.7*  HCT 32.1* 34.5*  MCV 88.7 90.1  PLT 251 256   CBG:  Recent Labs Lab 01/28/14 0804 01/28/14 1228 01/28/14 1737 01/28/14 2223 01/29/14 0806  GLUCAP 131* 186* 215* 110* 133*    Recent Results (from the past 240 hour(s))  CULTURE, BLOOD (ROUTINE X 2)     Status: None   Collection Time    01/21/14  4:34 PM      Result Value Ref Range Status   Specimen Description BLOOD RIGHT HAND   Final   Special Requests BOTTLES DRAWN AEROBIC ONLY 3CC   Final   Culture  Setup Time     Final   Value: 01/22/2014 01:49     Performed at [facility]   Culture     Final   Value: NO GROWTH 5 DAYS     Performed at [facility]   Report Status 01/28/2014 FINAL   Final  CULTURE, BLOOD (ROUTINE X 2)     Status: None   Collection Time    01/21/14  5:15 PM      Result Value Ref Range Status   Specimen Description BLOOD LEFT ARM   Final   Special Requests BOTTLES DRAWN AEROBIC AND ANAEROBIC 8CC   Final   Culture  Setup Time     Final   Value: 01/22/2014 01:50     Performed at [facility]   Culture     Final   Value: NO GROWTH 5 DAYS     Performed at [facility]   Report Status 01/28/2014 FINAL   Final  URINE  CULTURE     Status: None   Collection Time    01/22/14  5:20 AM      Result Value Ref Range Status   Specimen Description URINE, RANDOM   Final   Special Requests NONE   Final   Culture  Setup Time     Final   Value: 01/22/2014 08:40     Performed at [facility] Count     Final   Value: >=100,000 COLONIES/ML     Performed at [facility]   Culture     Final   Value: Multiple bacterial morphotypes present, none predominant. Suggest appropriate  recollection if clinically indicated.     Performed at [facility]   Report Status 01/23/2014 FINAL   Final     Studies:  Recent x-ray studies have been reviewed in detail by the Attending Physician  Scheduled Meds:  Scheduled Meds: . aspirin  81 mg Oral Daily  . atorvastatin  40 mg Oral q1800  . carvedilol  6.25 mg Oral BID WC  . hydrALAZINE  10 mg Oral 3 times per day  . insulin aspart  0-15 Units Subcutaneous TID WC  . insulin aspart  0-5 Units Subcutaneous QHS  . isosorbide mononitrate  30 mg Oral Daily  . nicotine  21 mg Transdermal Daily    Time spent on care of this patient: 35 mins   MCCLUNG,JEFFREY T , MD   [facility] Office  [phone] Pager - Text Page per Shea Evans as per below:  On-Call/Text Page:      [email or web address]      password [email or web address]  If 7PM-7AM, please contact night-coverage [email or web address] Password [email or web address] 01/29/2014, 1:20 PM   LOS: 8 days

## 2014-01-29 NOTE — Progress Notes (Signed)
Patient Name: Kathleen [name] Date of Encounter: 01/29/2014     Principal Problem:   BPPV (benign paroxysmal positional vertigo) Active Problems:   Vertigo   Nonspecific abnormal electrocardiogram (ECG) (EKG)   Hypertensive urgency   Nicotine dependence   Type II or unspecified type diabetes mellitus with peripheral circulatory disorders, uncontrolled(250.72)   Secondary cardiomyopathy, unspecified   Hyperlipidemia   CKD stage 3 due to type 2 diabetes mellitus   Ischemic dilated cardiomyopathy    SUBJECTIVE  3/10 R shoulder pain. No CP or SOB.   CURRENT MEDS . aspirin  81 mg Oral Daily  . atorvastatin  40 mg Oral q1800  . carvedilol  6.25 mg Oral BID WC  . insulin aspart  0-15 Units Subcutaneous TID WC  . insulin aspart  0-5 Units Subcutaneous QHS  . nicotine  21 mg Transdermal Daily    OBJECTIVE  Filed Vitals:   01/28/14 1230 01/28/14 1648 01/28/14 2038 01/29/14 0010  BP: 184/68 129/55 154/60 158/59  Pulse: 84 82 81 76  Temp: 98.4 F (36.9 C) 98.6 F (37 C) 98.9 F (37.2 C) 98.4 F (36.9 C)  TempSrc: Oral Oral Oral Oral  Resp: 18 18 15 18   Height:      Weight:    201 lb 11.5 oz (91.5 kg)  SpO2: 99% 98% 97% 94%    Intake/Output Summary (Last 24 hours) at 01/29/14 0617 Last data filed at 01/28/14 1831  Gross per 24 hour  Intake    320 ml  Output      0 ml  Net    320 ml   Filed Weights   01/27/14 0500 01/28/14 0024 01/29/14 0010  Weight: 200 lb 8 oz (90.946 kg) 200 lb 2.8 oz (90.8 kg) 201 lb 11.5 oz (91.5 kg)    PHYSICAL EXAM  General: Pleasant, NAD. Neuro: Alert and oriented X 3. Moves all extremities spontaneously. Psych: Normal affect. HEENT:  Normal  Neck: Supple without bruits or JVD. Lungs:  Resp regular and unlabored, CTA. Heart: RRR no s3, s4, or murmurs. Abdomen: Soft, non-tender, non-distended, BS + x 4.  Extremities: No clubbing, cyanosis or edema. DP/PT/Radials 2+ and equal bilaterally. R radial site without hematoma/ecchymosis,  good pulse in tact   Accessory Clinical Findings  CBC  Recent Labs  01/28/14 0927  WBC 3.9*  HGB 10.7*  HCT 34.5*  MCV 90.1  PLT 123456   Basic Metabolic Panel  Recent Labs  01/27/14 0453 01/28/14 0224  NA 141 139  K 4.9 5.1  CL 103 104  CO2 26 24  GLUCOSE 111* 218*  BUN 52* 49*  CREATININE 1.86* 1.82*  CALCIUM 8.2* 8.3*   Liver Function Tests  Recent Labs  01/28/14 0224  AST 19  ALT 9  ALKPHOS 109  BILITOT <0.2*  PROT 6.3  ALBUMIN 2.9*    TELE  Sinus brady HR 40s  Radiology/Studies  Ct Head Wo Contrast  01/21/2014   CLINICAL DATA:  Dizziness with nausea.  Possible dehydration.  EXAM: CT HEAD WITHOUT CONTRAST  TECHNIQUE: Contiguous axial images were obtained from the base of the skull through the vertex without intravenous contrast.  COMPARISON:  None.  FINDINGS: There is no evidence of acute intracranial hemorrhage, mass lesion, brain edema or extra-axial fluid collection. The ventricles and subarachnoid spaces are appropriately sized for age. There is no CT evidence of acute cortical infarction. Minimal fat density is noted along the interhemispheric fissure.  The visualized paranasal sinuses, mastoid air cells and middle  ears are clear. The calvarium is intact.  IMPRESSION: Unremarkable noncontrast head CT.   Electronically Signed   By: Camie Patience M.D.   On: 01/21/2014 10:33   Mr Jodene Nam Head Wo Contrast  01/21/2014   CLINICAL DATA:  Weakness and dizziness. Not able to ambulate. Diabetic hypertensive patient with renal disorder.  EXAM: MRI HEAD WITHOUT CONTRAST  MRA HEAD WITHOUT CONTRAST  TECHNIQUE: Multiplanar, multiecho pulse sequences of the brain and surrounding structures were obtained without intravenous contrast. Angiographic images of the head were obtained using MRA technique without contrast.  COMPARISON:  01/21/2014 head CT.  No comparison brain MR.  FINDINGS: MRI HEAD FINDINGS  No acute infarct.  Left parietal region extra-axial lenticular shaped 3.5 x  2.8 x 1 cm lesion has an appearance suggestive of a meningioma. No evidence to suggest this represents a posttraumatic blood collection. When this is evaluated on follow-up imaging, contrast enhanced imaging may be considered.  Mild small vessel disease type changes.  No hydrocephalus.  Major intracranial vascular structures are patent.  Exophthalmos.  C3-4 bulge with mild spinal stenosis.  Partially empty sella which does not appear to be extended.  Cervical medullary junction unremarkable.  MRA HEAD FINDINGS  Mild narrowing supraclinoid segment of the internal carotid artery more notable on the left.  High grade focal stenosis A1 segment left anterior cerebral artery.  Mild narrowing A1 segment right anterior cerebral artery.  Mild to moderate narrowing distal M1 segment left middle cerebral artery.  Moderate to marked narrowing distal M1 segment right middle cerebral artery/right middle cerebral artery bifurcation.  Middle cerebral artery mild to moderate branch vessel irregularity bilaterally.  A2 segment anterior cerebral artery narrowing and irregularity greater on the right.  Ectatic vertebral arteries with mild to slightly moderate narrowing.  Full extent of the posterior inferior cerebellar arteries not imaged.  Mild to moderate narrowing of portions of the ectatic basilar artery.  Anterior circulation contributes to the posterior cerebral artery supply and therefore subsequent small vertebrobasilar system.  Nonvisualized anterior inferior cerebellar arteries and left superior cerebellar artery with narrowed right superior cerebral artery.  Moderate to marked regions of narrowing involving the portions of the posterior cerebral artery bilaterally.  No aneurysm detected on this slightly motion degraded exam.  IMPRESSION: MRI HEAD:  No acute infarct.  Left parietal region extra-axial lenticular shaped 3.5 x 2.8 x 1 cm lesion has an appearance suggestive of a meningioma. No evidence to suggest this represents  a posttraumatic blood collection. When this is evaluated on follow-up imaging, contrast enhanced imaging may be considered.  Mild small vessel disease type changes.  Exophthalmos.  C3-4 bulge with mild spinal stenosis.  MRA HEAD:  Exam is motion degraded which may accentuate above described intracranial atherosclerotic type changes.   Electronically Signed   By: Chauncey Cruel M.D.   On: 01/21/2014 17:14   Mr Brain Wo Contrast  01/21/2014   CLINICAL DATA:  Weakness and dizziness. Not able to ambulate. Diabetic hypertensive patient with renal disorder.  EXAM: MRI HEAD WITHOUT CONTRAST  MRA HEAD WITHOUT CONTRAST  TECHNIQUE: Multiplanar, multiecho pulse sequences of the brain and surrounding structures were obtained without intravenous contrast. Angiographic images of the head were obtained using MRA technique without contrast.  COMPARISON:  01/21/2014 head CT.  No comparison brain MR.  FINDINGS: MRI HEAD FINDINGS  No acute infarct.  Left parietal region extra-axial lenticular shaped 3.5 x 2.8 x 1 cm lesion has an appearance suggestive of a meningioma. No evidence to suggest this  represents a posttraumatic blood collection. When this is evaluated on follow-up imaging, contrast enhanced imaging may be considered.  Mild small vessel disease type changes.  No hydrocephalus.  Major intracranial vascular structures are patent.  Exophthalmos.  C3-4 bulge with mild spinal stenosis.  Partially empty sella which does not appear to be extended.  Cervical medullary junction unremarkable.  MRA HEAD FINDINGS  Mild narrowing supraclinoid segment of the internal carotid artery more notable on the left.  High grade focal stenosis A1 segment left anterior cerebral artery.  Mild narrowing A1 segment right anterior cerebral artery.  Mild to moderate narrowing distal M1 segment left middle cerebral artery.  Moderate to marked narrowing distal M1 segment right middle cerebral artery/right middle cerebral artery bifurcation.  Middle  cerebral artery mild to moderate branch vessel irregularity bilaterally.  A2 segment anterior cerebral artery narrowing and irregularity greater on the right.  Ectatic vertebral arteries with mild to slightly moderate narrowing.  Full extent of the posterior inferior cerebellar arteries not imaged.  Mild to moderate narrowing of portions of the ectatic basilar artery.  Anterior circulation contributes to the posterior cerebral artery supply and therefore subsequent small vertebrobasilar system.  Nonvisualized anterior inferior cerebellar arteries and left superior cerebellar artery with narrowed right superior cerebral artery.  Moderate to marked regions of narrowing involving the portions of the posterior cerebral artery bilaterally.  No aneurysm detected on this slightly motion degraded exam.  IMPRESSION: MRI HEAD:  No acute infarct.  Left parietal region extra-axial lenticular shaped 3.5 x 2.8 x 1 cm lesion has an appearance suggestive of a meningioma. No evidence to suggest this represents a posttraumatic blood collection. When this is evaluated on follow-up imaging, contrast enhanced imaging may be considered.  Mild small vessel disease type changes.  Exophthalmos.  C3-4 bulge with mild spinal stenosis.  MRA HEAD:  Exam is motion degraded which may accentuate above described intracranial atherosclerotic type changes.   Electronically Signed   By: Chauncey Cruel M.D.   On: 01/21/2014 17:14   Nm Myocar Multi W/spect W/wall Motion / Ef  01/24/2014   CLINICAL DATA:  Chest pain, history of cardiomyopathy of uncertain etiology. History of diabetes, hypertension, chronic stage IV renal disease  EXAM: MYOCARDIAL IMAGING WITH SPECT (REST AND PHARMACOLOGIC-STRESS - 2 DAY PROTOCOL)  GATED LEFT VENTRICULAR WALL MOTION STUDY  LEFT VENTRICULAR EJECTION FRACTION  TECHNIQUE: Standard myocardial SPECT imaging was performed after resting intravenous injection of 10 mCi Tc-73m sestamibi. Subsequently, on a second day,  intravenous infusion of Lexiscan was performed under the supervision of the Cardiology staff. At peak effect of the drug, 30 mCi Tc-24m sestamibi was injected intravenously and standard myocardial SPECT imaging was performed. Quantitative gated imaging was also performed to evaluate left ventricular wall motion, and estimate left ventricular ejection fraction.  COMPARISON:  Chest radiograph - 09/01/2008  FINDINGS: Review of the rotational raw images demonstrates focal contamination involving the left chest wall and right buttocks on the rest images and contamination involving the right axilla and mid back on the stress images. There is mild breast attenuation artifact. No significant patient motion artifact  SPECT imaging demonstrates matched area of non perfusion involving the inferior wall of the left ventricle worrisome for prior infarction. This area of non perfusion involving the inferior wall were since on the provided stress images and osseus worrisome for an area of peri-infarct ischemia. Additional, there is mismatched decreased perfusion involving the anterior and lateral walls of the left ventricle on the provided stress images also  worrisome for additional areas of pharmacologically induced ischemia.  Quantitative gated analysis demonstrates mild global hypokinesia without geographic wall motion abnormality.  The resting left ventricular ejection fraction is [id number] with end-diastolic volume of [id number] ml and end-systolic volume of 78 ml.  IMPRESSION: 1. Suspected prior infarction involving the inferior wall of the left ventricle with associated minimal amount of peri-infarct ischemia. 2. Suspected pharmacologically induced ischemia involving the anterior and lateral walls of the left ventricle. 3. Mild global hypokinesia without geographic wall motion abnormality. Ejection fraction - 41%. These results will be called to the ordering clinician or representative by the Radiologist Assistant, and communication  documented in the PACS or zVision Dashboard.   Electronically Signed   By: Sandi Mariscal M.D.   On: 01/24/2014 15:37    ASSESSMENT AND PLAN  Pt is a 68 year old female with PMHx of HTN, DM, and renal disorder admitted 6/16 for dizziness/lightheadedness and stroke/TIA workup. MRI negative for acute infarct., but positive for L parietal lobe meningioma. She had an abnormal nuclear stress test with low EF. Now s/p L heart cath 01/27/14 without significant need for revascularization.  Mixed ischemic and nonischemic cardiomyopathy (out of proportion to CAD) EF 25-30% by echo, newly dx this adm - she was on ARB prior to admission. We may be able to restart this in the next day or so since Cr is stable post cath. Continue BB. Will need reassessment of EF in 3 months to determine if she would meet criteria for ICD.   CAD - mild in LAD, mild-mod LCx, mod-severe RCA - favor medical therapy. Continue aspirin, beta blocker, statin.   HTN - improved.   Hyperlipidemia - change to Lipitor given renal insufficiency and LDL 173. Add baseline LFTs onto today's labs.   CKD stage IV - Cr stable post-cath.   Anemia - Drop from 11-13 range to 9.9. Denies bleeding. ? Further per IM.   Vertigo with incidental meningioma on imaging - per IM.   Obesity Body mass index is 35.47 kg/(m^2). - lifestyle mod.   Diabetes mellitus - favor d/c Actos indefinitely given LV dysfunction.    Tyrell Antonio PA-C  Pager [phone] As above, patient seen and examined. She denies chest pain or dyspnea. Right radial cath site without hematoma. Plan to continue aspirin and statin for coronary artery disease. Her ejection fraction is reduced. Continue carvedilol. Add hydralazine/nitrate. I would hold on ACE inhibitor or ARB for now until it is clear her renal function is stable following recent dye load she will need her potassium and renal function checked on June 26. Hopefully she will not develop contrast nephropathy. She will  need close followup with Dr. Johnsie Cancel following discharge. After medications fully titrated she would need repeat echocardiogram and consideration of ICD ejection fraction less than 35%. Kirk Ruths

## 2014-01-30 ENCOUNTER — Encounter (HOSPITAL_COMMUNITY): Payer: Self-pay | Admitting: Physician Assistant

## 2014-01-30 DIAGNOSIS — D329 Benign neoplasm of meninges, unspecified: Secondary | ICD-10-CM | POA: Diagnosis present

## 2014-01-30 DIAGNOSIS — D649 Anemia, unspecified: Secondary | ICD-10-CM | POA: Diagnosis present

## 2014-01-30 DIAGNOSIS — H8110 Benign paroxysmal vertigo, unspecified ear: Principal | ICD-10-CM

## 2014-01-30 DIAGNOSIS — I2510 Atherosclerotic heart disease of native coronary artery without angina pectoris: Secondary | ICD-10-CM | POA: Diagnosis present

## 2014-01-30 DIAGNOSIS — E669 Obesity, unspecified: Secondary | ICD-10-CM | POA: Diagnosis present

## 2014-01-30 DIAGNOSIS — D320 Benign neoplasm of cerebral meninges: Secondary | ICD-10-CM

## 2014-01-30 LAB — BASIC METABOLIC PANEL
BUN: 49 mg/dL — ABNORMAL HIGH (ref 6–23)
CO2: 24 mEq/L (ref 19–32)
Calcium: 9 mg/dL (ref 8.4–10.5)
Chloride: 102 mEq/L (ref 96–112)
Creatinine, Ser: 2.02 mg/dL — ABNORMAL HIGH (ref 0.50–1.10)
GFR, EST AFRICAN AMERICAN: 28 mL/min — AB (ref >90)
GFR, EST NON AFRICAN AMERICAN: 24 mL/min — AB (ref >90)
Glucose, Bld: 110 mg/dL — ABNORMAL HIGH (ref 70–99)
POTASSIUM: 5.1 meq/L (ref 3.7–5.3)
SODIUM: 139 meq/L (ref 137–147)

## 2014-01-30 LAB — GLUCOSE, CAPILLARY
GLUCOSE-CAPILLARY: 126 mg/dL — AB (ref 70–99)
Glucose-Capillary: 221 mg/dL — ABNORMAL HIGH (ref 70–99)

## 2014-01-30 MED ORDER — CARVEDILOL 6.25 MG PO TABS: 6.2500 mg | ORAL_TABLET | Freq: Two times a day (BID) | ORAL | Status: DC

## 2014-01-30 MED ORDER — SIMVASTATIN 20 MG PO TABS: 20.0000 mg | ORAL_TABLET | Freq: Every day | ORAL | Status: DC

## 2014-01-30 MED ORDER — ASPIRIN 81 MG PO TBEC: 81.0000 mg | DELAYED_RELEASE_TABLET | Freq: Every day | ORAL | Status: DC

## 2014-01-30 MED ORDER — ISOSORBIDE MONONITRATE ER 30 MG PO TB24: 30.0000 mg | ORAL_TABLET | Freq: Every day | ORAL | Status: DC

## 2014-01-30 MED ORDER — HYDRALAZINE HCL 10 MG PO TABS: 10.0000 mg | ORAL_TABLET | Freq: Three times a day (TID) | ORAL | Status: DC

## 2014-01-30 MED ORDER — MECLIZINE HCL 12.5 MG PO TABS: 12.5000 mg | ORAL_TABLET | Freq: Three times a day (TID) | ORAL | Status: DC | PRN

## 2014-01-30 NOTE — Progress Notes (Signed)
Heart failure packet given to pt at this time.

## 2014-01-30 NOTE — Progress Notes (Signed)
Kathleen [name] to be D/C'd Home per MD order.  Discussed with the patient and all questions fully answered.    Medication List    STOP taking these medications       furosemide 40 MG tablet  Commonly known as:  LASIX     hydrochlorothiazide 25 MG tablet  Commonly known as:  HYDRODIURIL     pioglitazone-metformin 15-850 MG per tablet  Commonly known as:  ACTOPLUS MET     telmisartan 80 MG tablet  Commonly known as:  MICARDIS      TAKE these medications       aspirin 81 MG EC tablet  Take 1 tablet (81 mg total) by mouth daily.     carvedilol 6.25 MG tablet  Commonly known as:  COREG  Take 1 tablet (6.25 mg total) by mouth 2 (two) times daily with a meal.     dorzolamide-timolol 22.3-6.8 MG/ML ophthalmic solution  Commonly known as:  COSOPT  Place 1 drop into both eyes 2 (two) times daily.     hydrALAZINE 10 MG tablet  Commonly known as:  APRESOLINE  Take 1 tablet (10 mg total) by mouth every 8 (eight) hours.     insulin detemir 100 UNIT/ML injection  Commonly known as:  LEVEMIR  Inject 25 Units into the skin at bedtime.     isosorbide mononitrate 30 MG 24 hr tablet  Commonly known as:  IMDUR  Take 1 tablet (30 mg total) by mouth daily.     meclizine 12.5 MG tablet  Commonly known as:  ANTIVERT  Take 1 tablet (12.5 mg total) by mouth 3 (three) times daily as needed for dizziness or nausea.     nicotine 21 mg/24hr patch  Commonly known as:  NICODERM CQ - dosed in mg/24 hours  Place 1 patch (21 mg total) onto the skin daily.     simvastatin 20 MG tablet  Commonly known as:  ZOCOR  Take 1 tablet (20 mg total) by mouth daily at 6 PM.     SYSTANE BALANCE OP  Apply 1 drop to eye 4 (four) times daily as needed (dry eyes).        VVS, Skin clean, dry and intact without evidence of skin break down, no evidence of skin tears noted. IV catheter discontinued intact. Site without signs and symptoms of complications. Dressing and pressure applied.  An After Visit  Summary was printed and given to the patient.  D/c education completed with patient/family including follow up instructions, medication list, d/c activities limitations if indicated, with other d/c instructions as indicated by MD - patient able to verbalize understanding, all questions fully answered.   Patient instructed to return to ED, call 911, or call MD for any changes in condition.   Patient escorted via [place], and D/C home via private auto.  Audria Nine F 01/30/2014 6:30 PM

## 2014-01-30 NOTE — Progress Notes (Addendum)
Inpatient Diabetes Program Recommendations  AACE/ADA: New Consensus Statement on Inpatient Glycemic Control (2013)  Target Ranges:  Prepandial:   less than 140 mg/dL      Peak postprandial:   less than 180 mg/dL (1-2 hours)      Critically ill patients:  140 - 180 mg/dL      Results for JADLYN, CREAGH (MRN [id number]) as of 01/30/2014 13:36  Ref. Range 01/30/2014 07:59 01/30/2014 11:58  Glucose-Capillary Latest Range: 70-99 mg/dL 126 (H) 221 (H)     **Patient still having issues with postprandial glucose levels  **Eating 85-100% of her meals    MD- If patient is not discharged home today, Please consider adding Novolog Meal Coverage- Novolog 4 units tid with meals    Will follow Wyn Quaker RN, MSN, CDE Diabetes Coordinator Inpatient Diabetes Program Team Pager: [phone] (8a-10p)

## 2014-01-30 NOTE — Progress Notes (Signed)
Physical Therapy Treatment Patient Details Name: Kathleen [name] MRN: [id number] DOB: 02/15/1946 Today's Date: 01/30/2014    History of Present Illness Pt is a 68 year old female with PMHx of HTN, DM, and renal disorder admitted 6/16 for dizziness/lightheadedness and stroke/TIA workup.  MRI negative for acute infarct., but positive for L parietal lobe meningioma. Now s/p L heart cath 01/27/14 without significant need for revascularization.    PT Comments    Patient presents with balance deficits affecting functional independence. Patient would benefit from skilled acute therapy to improve higher level balance and to reduce risk of falls so pt can return to PLOF. Patient to be returning home with son at discharge. Provided education to patient and son on proper and safe stair negotiation. Recommend home with intermittent supervision/assist and use of RW as needed as well as outpatient therapy services to address balance deficits.  Follow Up Recommendations  Outpatient PT     Equipment Recommendations  None recommended by PT    Recommendations for Other Services       Precautions / Restrictions Precautions Precautions: Fall    Mobility  Bed Mobility               General bed mobility comments: Patient sitting EOB upon P.T. arrival.  Transfers Overall transfer level: Needs assistance Equipment used:  (Handrail as needed.) Transfers: Sit to/from Stand Sit to Stand: Modified independent (Device/Increase time)            Ambulation/Gait Ambulation/Gait assistance: Supervision;Modified independent (Device/Increase time) Ambulation Distance (Feet): 250 Feet Assistive device: None (utilized handrail in hallway as needed.) Gait Pattern/deviations: Step-through pattern;Decreased step length - right;Decreased step length - left;Wide base of support Gait velocity: Decreased gait speed. Gait velocity interpretation: Below normal speed for age/gender General Gait Details: Slow  gait speed however no LOB. No dizziness reported today during ambulation. Declined utilizing RW for ambulation but has it at home as needed.   Stairs Stairs: Yes Stairs assistance: Min assist Stair Management: No rails Number of Stairs: 2 General stair comments: utilized therapist's hand to simulate handrail as pt does not have handrail at home.  Wheelchair Mobility    Modified Rankin (Stroke Patients Only)       Balance Overall balance assessment: Needs assistance Sitting-balance support: No upper extremity supported;Feet unsupported Sitting balance-Leahy Scale: Normal     Standing balance support: No upper extremity supported;Single extremity supported;During functional activity Standing balance-Leahy Scale: Good Standing balance comment: Pt able to ambulate without AD utilizing handrail as needed. Unsteadiness noted initially but no LOB. Pt agreed to utilize walker at home when feeling off balance.                    Cognition Arousal/Alertness: Awake/alert Behavior During Therapy: WFL for tasks assessed/performed Overall Cognitive Status: Within Functional Limits for tasks assessed                      Exercises      General Comments        Pertinent Vitals/Pain  No pain reported.    Home Living                      Prior Function            PT Goals (current goals can now be found in the care plan section) Acute Rehab PT Goals Patient Stated Goal: to go home. PT Goal Formulation: With patient Time For Goal Achievement: 02/06/14  Potential to Achieve Goals: Good Progress towards PT goals: Goals met and updated - see care plan    Frequency  Min 3X/week    PT Plan Discharge plan needs to be updated    Co-evaluation             End of Session   Activity Tolerance: Patient tolerated treatment well Patient left: in bed;with call bell/phone within reach;with family/visitor present     Time: 2409-7353 PT Time  Calculation (min): 19 min  Charges:  $Gait Training: 8-22 mins                    G CodesCandy Sledge A February 20, 2014, 2:08 PM [phone]

## 2014-01-30 NOTE — Progress Notes (Signed)
Patient: Kathleen [name] / Admit Date: 01/21/2014 / Date of Encounter: 01/30/2014, 7:49 AM  Subjective  No CP or SOB. Feels well enough to go home, if that's the plan.  Objective   Telemetry: n/a  Physical Exam: Blood pressure 142/78, pulse 70, temperature 97.8 F (36.6 C), temperature source Oral, resp. rate 18, height 5\' 3"  (1.6 m), weight 202 lb 6.4 oz (91.808 kg), SpO2 97.00%. General: Well developed, well nourished overweight AAF in no acute distress.  Head: Normocephalic, atraumatic, sclera non-icteric, no xanthomas, nares are without discharge.  Neck: JVP not elevated.  Lungs: Clear bilaterally to auscultation without wheezes, rales, or rhonchi. Breathing is unlabored.  Heart: RRR S1 S2 no murmurs, rubs, gallops Abdomen: Soft, non-tender, non-distended with normoactive bowel sounds. No rebound/guarding.  Extremities: No clubbing or cyanosis. Tr pedal edema, venous stasis coloration changes. Distal pedal pulses are 2+ and equal bilaterally. R radial site with mild proximal ecchymosis which appears already in a stage of healing, no hematoma, good pulse in tact  Neuro: Alert and oriented X 3. Moves all extremities spontaneously.  Psych: Responds to questions appropriately with a normal affect.   Intake/Output Summary (Last 24 hours) at 01/30/14 0749 Last data filed at 01/29/14 1300  Gross per 24 hour  Intake    240 ml  Output      0 ml  Net    240 ml    Inpatient Medications:  . aspirin  81 mg Oral Daily  . atorvastatin  40 mg Oral q1800  . carvedilol  6.25 mg Oral BID WC  . dorzolamide-timolol  1 drop Both Eyes BID  . heparin subcutaneous  5,000 Units Subcutaneous 3 times per day  . hydrALAZINE  10 mg Oral 3 times per day  . insulin aspart  0-15 Units Subcutaneous TID WC  . insulin aspart  0-5 Units Subcutaneous QHS  . isosorbide mononitrate  30 mg Oral Daily  . nicotine  21 mg Transdermal Daily   Infusions:    Labs:  Recent Labs  01/29/14 0406 01/30/14 0435    NA 141 139  K 4.9 5.1  CL 102 102  CO2 22 24  GLUCOSE 148* 110*  BUN 53* 49*  CREATININE 2.20* 2.02*  CALCIUM 9.0 9.0    Recent Labs  01/28/14 0224  AST 19  ALT 9  ALKPHOS 109  BILITOT <0.2*  PROT 6.3  ALBUMIN 2.9*    Recent Labs  01/28/14 0927  WBC 3.9*  HGB 10.7*  HCT 34.5*  MCV 90.1  PLT 256   No results found for this basename: CKTOTAL, CKMB, TROPONINI,  in the last 72 hours No components found with this basename: POCBNP,  No results found for this basename: HGBA1C,  in the last 72 hours   Radiology/Studies:  Ct Head Wo Contrast  01/21/2014   CLINICAL DATA:  Dizziness with nausea.  Possible dehydration.  EXAM: CT HEAD WITHOUT CONTRAST  TECHNIQUE: Contiguous axial images were obtained from the base of the skull through the vertex without intravenous contrast.  COMPARISON:  None.  FINDINGS: There is no evidence of acute intracranial hemorrhage, mass lesion, brain edema or extra-axial fluid collection. The ventricles and subarachnoid spaces are appropriately sized for age. There is no CT evidence of acute cortical infarction. Minimal fat density is noted along the interhemispheric fissure.  The visualized paranasal sinuses, mastoid air cells and middle ears are clear. The calvarium is intact.  IMPRESSION: Unremarkable noncontrast head CT.   Electronically Signed   By: Rush Landmark  Lin Landsman M.D.   On: 01/21/2014 10:33   Mr Jodene Nam Head Wo Contrast  01/21/2014   CLINICAL DATA:  Weakness and dizziness. Not able to ambulate. Diabetic hypertensive patient with renal disorder.  EXAM: MRI HEAD WITHOUT CONTRAST  MRA HEAD WITHOUT CONTRAST  TECHNIQUE: Multiplanar, multiecho pulse sequences of the brain and surrounding structures were obtained without intravenous contrast. Angiographic images of the head were obtained using MRA technique without contrast.  COMPARISON:  01/21/2014 head CT.  No comparison brain MR.  FINDINGS: MRI HEAD FINDINGS  No acute infarct.  Left parietal region extra-axial  lenticular shaped 3.5 x 2.8 x 1 cm lesion has an appearance suggestive of a meningioma. No evidence to suggest this represents a posttraumatic blood collection. When this is evaluated on follow-up imaging, contrast enhanced imaging may be considered.  Mild small vessel disease type changes.  No hydrocephalus.  Major intracranial vascular structures are patent.  Exophthalmos.  C3-4 bulge with mild spinal stenosis.  Partially empty sella which does not appear to be extended.  Cervical medullary junction unremarkable.  MRA HEAD FINDINGS  Mild narrowing supraclinoid segment of the internal carotid artery more notable on the left.  High grade focal stenosis A1 segment left anterior cerebral artery.  Mild narrowing A1 segment right anterior cerebral artery.  Mild to moderate narrowing distal M1 segment left middle cerebral artery.  Moderate to marked narrowing distal M1 segment right middle cerebral artery/right middle cerebral artery bifurcation.  Middle cerebral artery mild to moderate branch vessel irregularity bilaterally.  A2 segment anterior cerebral artery narrowing and irregularity greater on the right.  Ectatic vertebral arteries with mild to slightly moderate narrowing.  Full extent of the posterior inferior cerebellar arteries not imaged.  Mild to moderate narrowing of portions of the ectatic basilar artery.  Anterior circulation contributes to the posterior cerebral artery supply and therefore subsequent small vertebrobasilar system.  Nonvisualized anterior inferior cerebellar arteries and left superior cerebellar artery with narrowed right superior cerebral artery.  Moderate to marked regions of narrowing involving the portions of the posterior cerebral artery bilaterally.  No aneurysm detected on this slightly motion degraded exam.  IMPRESSION: MRI HEAD:  No acute infarct.  Left parietal region extra-axial lenticular shaped 3.5 x 2.8 x 1 cm lesion has an appearance suggestive of a meningioma. No evidence to  suggest this represents a posttraumatic blood collection. When this is evaluated on follow-up imaging, contrast enhanced imaging may be considered.  Mild small vessel disease type changes.  Exophthalmos.  C3-4 bulge with mild spinal stenosis.  MRA HEAD:  Exam is motion degraded which may accentuate above described intracranial atherosclerotic type changes.   Electronically Signed   By: Chauncey Cruel M.D.   On: 01/21/2014 17:14   Mr Brain Wo Contrast  01/21/2014   CLINICAL DATA:  Weakness and dizziness. Not able to ambulate. Diabetic hypertensive patient with renal disorder.  EXAM: MRI HEAD WITHOUT CONTRAST  MRA HEAD WITHOUT CONTRAST  TECHNIQUE: Multiplanar, multiecho pulse sequences of the brain and surrounding structures were obtained without intravenous contrast. Angiographic images of the head were obtained using MRA technique without contrast.  COMPARISON:  01/21/2014 head CT.  No comparison brain MR.  FINDINGS: MRI HEAD FINDINGS  No acute infarct.  Left parietal region extra-axial lenticular shaped 3.5 x 2.8 x 1 cm lesion has an appearance suggestive of a meningioma. No evidence to suggest this represents a posttraumatic blood collection. When this is evaluated on follow-up imaging, contrast enhanced imaging may be considered.  Mild small vessel  disease type changes.  No hydrocephalus.  Major intracranial vascular structures are patent.  Exophthalmos.  C3-4 bulge with mild spinal stenosis.  Partially empty sella which does not appear to be extended.  Cervical medullary junction unremarkable.  MRA HEAD FINDINGS  Mild narrowing supraclinoid segment of the internal carotid artery more notable on the left.  High grade focal stenosis A1 segment left anterior cerebral artery.  Mild narrowing A1 segment right anterior cerebral artery.  Mild to moderate narrowing distal M1 segment left middle cerebral artery.  Moderate to marked narrowing distal M1 segment right middle cerebral artery/right middle cerebral artery  bifurcation.  Middle cerebral artery mild to moderate branch vessel irregularity bilaterally.  A2 segment anterior cerebral artery narrowing and irregularity greater on the right.  Ectatic vertebral arteries with mild to slightly moderate narrowing.  Full extent of the posterior inferior cerebellar arteries not imaged.  Mild to moderate narrowing of portions of the ectatic basilar artery.  Anterior circulation contributes to the posterior cerebral artery supply and therefore subsequent small vertebrobasilar system.  Nonvisualized anterior inferior cerebellar arteries and left superior cerebellar artery with narrowed right superior cerebral artery.  Moderate to marked regions of narrowing involving the portions of the posterior cerebral artery bilaterally.  No aneurysm detected on this slightly motion degraded exam.  IMPRESSION: MRI HEAD:  No acute infarct.  Left parietal region extra-axial lenticular shaped 3.5 x 2.8 x 1 cm lesion has an appearance suggestive of a meningioma. No evidence to suggest this represents a posttraumatic blood collection. When this is evaluated on follow-up imaging, contrast enhanced imaging may be considered.  Mild small vessel disease type changes.  Exophthalmos.  C3-4 bulge with mild spinal stenosis.  MRA HEAD:  Exam is motion degraded which may accentuate above described intracranial atherosclerotic type changes.   Electronically Signed   By: Chauncey Cruel M.D.   On: 01/21/2014 17:14   Nm Myocar Multi W/spect W/wall Motion / Ef  01/24/2014   CLINICAL DATA:  Chest pain, history of cardiomyopathy of uncertain etiology. History of diabetes, hypertension, chronic stage IV renal disease  EXAM: MYOCARDIAL IMAGING WITH SPECT (REST AND PHARMACOLOGIC-STRESS - 2 DAY PROTOCOL)  GATED LEFT VENTRICULAR WALL MOTION STUDY  LEFT VENTRICULAR EJECTION FRACTION  TECHNIQUE: Standard myocardial SPECT imaging was performed after resting intravenous injection of 10 mCi Tc-48m sestamibi. Subsequently, on a  second day, intravenous infusion of Lexiscan was performed under the supervision of the Cardiology staff. At peak effect of the drug, 30 mCi Tc-51m sestamibi was injected intravenously and standard myocardial SPECT imaging was performed. Quantitative gated imaging was also performed to evaluate left ventricular wall motion, and estimate left ventricular ejection fraction.  COMPARISON:  Chest radiograph - 09/01/2008  FINDINGS: Review of the rotational raw images demonstrates focal contamination involving the left chest wall and right buttocks on the rest images and contamination involving the right axilla and mid back on the stress images. There is mild breast attenuation artifact. No significant patient motion artifact  SPECT imaging demonstrates matched area of non perfusion involving the inferior wall of the left ventricle worrisome for prior infarction. This area of non perfusion involving the inferior wall were since on the provided stress images and osseus worrisome for an area of peri-infarct ischemia. Additional, there is mismatched decreased perfusion involving the anterior and lateral walls of the left ventricle on the provided stress images also worrisome for additional areas of pharmacologically induced ischemia.  Quantitative gated analysis demonstrates mild global hypokinesia without geographic wall motion abnormality.  The resting left ventricular ejection fraction is [id number] with end-diastolic volume of [id number] ml and end-systolic volume of 78 ml.  IMPRESSION: 1. Suspected prior infarction involving the inferior wall of the left ventricle with associated minimal amount of peri-infarct ischemia. 2. Suspected pharmacologically induced ischemia involving the anterior and lateral walls of the left ventricle. 3. Mild global hypokinesia without geographic wall motion abnormality. Ejection fraction - 41%. These results will be called to the ordering clinician or representative by the Radiologist Assistant, and  communication documented in the PACS or zVision Dashboard.   Electronically Signed   By: Sandi Mariscal M.D.   On: 01/24/2014 15:37     Assessment and Plan  1. Mixed ischemic and nonischemic cardiomyopathy (out of proportion to CAD) EF 25-30% by echo, newly dx this adm - she was on ARB prior to admission. With renal function and K in the 4.8-5.1 range, would hold off on resuming at present time. Imdur/hydralazine have just been started yesterday - will defer to MD whether to increase hydralazine to 20mg  TID or just wait to see what new meds do. Continue BB. Will need reassessment of EF in 3 months after med optimization to determine if she would meet criteria for ICD. Seen by dietician for education on salt restriction, fluid restriction, daily weights.  2. CAD - mild in LAD, mild-mod LCx, mod-severe RCA - for medical therapy. Continue aspirin, beta blocker, statin.  3. CKD stage III-IV - Cr bumped slightly post-cath to 2.20 yesterday. F/u level is 2.02 today (2.13 on adm, 1.88 pre-cath). Dr. Stanford Breed suggests that f/u BMET is needed on 6/26. Will ask him to clarify if she needs to stay inpatient for this.  4. HTN - improved.  5. Hyperlipidemia - LDL 173. Continue lipitor. Can repeat LFTs/lipids in about 2 months. 6. Anemia - Denies bleeding, FOBT negative, IM believes to be anemia of chronic disease.  7. Vertigo with incidental meningioma on imaging - per IM.  8. Obesity Body mass index is 35.47 kg/(m^2). - lifestyle mod.  9. Diabetes mellitus - favor d/c Actos indefinitely given LV dysfunction.   F/u appt preliminarily scheduled 7/6 at 11:15am in our ofc with PA.  Signed,  Melina Copa PA-C As above; patient seen and examined; No chest pain or dyspnea; Plan to continue aspirin and statin for coronary artery disease. Her ejection fraction is reduced. Continue carvedilol and hydralazine/nitrates. I would hold on ACE inhibitor or ARB for now until it is clear her renal function is stable. Renal function  stable this AM. Following recent dye load she will need her potassium and renal function checked on June 26. Hopefully she will not develop contrast nephropathy. She will need close followup with Dr. Johnsie Cancel following discharge. After medications fully titrated she would need repeat echocardiogram and consideration of ICD ejection fraction less than 35%. Resume lasix in AM if renal function stable. Patient can be DCed from a cardiac standpoint.  Kirk Ruths

## 2014-01-30 NOTE — Discharge Summary (Signed)
Physician Discharge Summary  Kathleen [name] NFA:[id number] DOB: 1946-03-18 DOA: 01/21/2014  PCP: Benito Mccreedy, MD  Admit date: 01/21/2014 Discharge date: 01/30/2014  Time spent: *40* minutes  Recommendations for Outpatient Follow-up:  1. Followup with primary care physician within one week. 2. Followup with Kathleen [name] with [facility] cardiology on 02/10/2014. 3. BMP to be done on 6/26 by home health service.  Discharge Diagnoses:  Principal Problem:   BPPV (benign paroxysmal positional vertigo) Active Problems:   Nonspecific abnormal electrocardiogram (ECG) (EKG)   Hypertensive urgency   Nicotine dependence   Type II or unspecified type diabetes mellitus with peripheral circulatory disorders, uncontrolled(250.72)   Mixed ischemic/nonischemic cardiomyopathy   Hyperlipidemia   CKD stage 3 due to type 2 diabetes mellitus   CAD (coronary artery disease)   Obesity   Meningioma   Anemia  Discharge Condition: Stable  Diet recommendation: Carb modified (diabetic), Low Sodium with Limited Fluids (heart healthy)  Filed Weights   01/28/14 0024 01/29/14 0010 01/30/14 0529  Weight: 90.8 kg (200 lb 2.8 oz) 91.5 kg (201 lb 11.5 oz) 91.808 kg (202 lb 6.4 oz)    History of present illness:  68 y.o female with PMH of HTN, DM, and CKD presented to ED for evaluation of sudden onset dizziness. She reports feeling lightheaded on night of 06/15 which resolved after eating dinner. She woke up with lightheaded and spinning sensation on 06/16. Denied headache, blurred vision or vision loss, tinnitus, fever/chills, muscle weakness, chest pain, palpitations, SOB. Her BP was 194/88 in ED. She reported sensation of blacking out with intermittent vertigo during process of her admission.   Hospital Course:   Principal Problem: Ischemic Cardiomyopathy  -Patient on presentation had elevated proBNP. No signs of volume overload noted. A 2-D echo was done part of workup for TIA and showed significantly  reduced EF of 25-30% with grade 1 diastolic dysfunction.  -No clinical signs of failure. Started on aspirin, carvedilol. Continued on Lasix and ARB then. Cardiology consulted and a stress test done on 6/18 suggestive of ischemic cardiomyopathy with EF of 41% with inferior infarct and possible ischemia of anterior lateral wall.  - scheduled cardiac cath with minimum dye showing the following:  1. Widely patent left main coronary artery.  2. Mild, nonobstructive disease in the left anterior descending artery and its branches.  3. Mild to moderate, nonobstructive disease in the left circumflex artery and its branches. Moderate to severe disease in the mid right coronary artery. Left ventricular systolic function not assessed. LVEDP 10 mmHg.  -Patient is at high risk for contrast-induced nephropathy. Discontinued Lasix and ARB and received IVNS pre and post precath.  -Cr is currently climbing, and is highest it has been during this admit was 2.1. -Added statin for hyperlipidemia . Counseled on medication compliance and lifestyle modification.  -Hold lasix and ACE/ARB until it is clear her renal function is stable, check BMP tomorrow and in one week. -Patient will followup with cardiology as outpatient to consider starting ARB/Lasix.  Vertigo/ Benign Paroxysmal Positional Vertigo -She presented initially with dizziness and workup for TIA/CVA and sepsis was started. -Blood cultures and UA were negative for infection.  - Head CT and MRI negative for infarct, edema or midline shift.  Left parietal lobe has 3.5 x 2.8 x 1 cm lesion likely consistent with incidental finding of meningioma. Recommended repeat scan 6 months.  -Cardiovascular pathway was pursued. EKGs were NSR with prolonged QTc and LVH. Tropinins x 3 were negative. Pro-BNP was 4302 on 6/16. B/L Carotid  duplex showed minimal stenosis. TTE showed no valvular abnormalities but EF was predicted 25-30% on 06/17 which warranted a further cardiology  workup.  -Neuro consulted, given the negative TIA/CVA and sepsis investigation, symptoms were consistent with BPPV.  -Rx Meclizine 12.5 TID prn resolved symptoms.   Hypertensive Urgency -BP was 194/88 in ED, was given Metoprolol, Lasix, Irbesartan, and Labetalol. ARB and Lasix were d/c due to AKI, and BP was managed with Carvedilol and Hydralazine. Her BP prior to discharge was stable at 144/70.   CKD stage III Baseline Creat 1.76 in 07/2013 per PCP, at presentation on 01/21/2014 creatinine was 1.86. Patient probably has CKD stage III secondary to diabetic nephropathy and uncontrolled hypertension. She was on ARB and Lasix prior to admission. On 6/22 patient received iodine based contrast for cardiac catheterization, likely she developed mild contrast nephropathy, her creatinine increased to 2.2. Cardiology recommended to hold both ARB and Lasix till the renal function stabilized. Check BMP in 1 week, patient will followup with cardiology on 7/6 to consider restarting these medications.  Diabetes Mellitus type II Being managed with Levemir and Novolog, Hgb A1c 7.2. Pt will need tighter control of blood sugars given her CKD and cardiovascular risk.   Hyperlipidemia Total cholesterol 253 and LDL of 173. Given her Diabetes and cardiovascular risk, was started on Atorvastatin 40 mg daily.  Nicotine Dependence Provided Nicotine patch on admission and was counseled on smoking cessation.   Procedures:  Cardiac cath with minimum dye showing the following:  1. Widely patent left main coronary artery.  2. Mild, nonobstructive disease in the left anterior descending artery and its branches.  3. Mild to moderate, nonobstructive disease in the left circumflex artery and its branches. Moderate to severe disease in the mid right coronary artery. Left ventricular systolic function not assessed. LVEDP 10 mmHg.   2-D echo - Normal left ventricular size, severely decreased LVEF 25-30% with diffuse  hypokinesis. Impaired relaxation with elevated filling pressures. Normal RV size and function. Normal RVSP.   Consultations:  Cardiology  Discharge Exam: Filed Vitals:   01/30/14 1348  BP: 172/85  Pulse: 72  Temp: 97.7 F (36.5 C)  Resp: 18    General: WDWN, NAD. Sitting on side of bed. Pleasant and cooperative to exam. Pt expressed understanding of her diagnoses.  Cardiovascular: RRR, no murmurs, rubs, gallops. S1 S2 auscultated.  Respiratory: Normal respiratory effort. Symmetric chest rise. CTAB Abdomen: Soft, nontender, nondistended. Positive bowel sounds. Extremities: Dry, scaly and slightly cooler skin b/l lower extremities. Two small blisters on anteriomedial aspect of right leg. Multiple areas of mild skin breakdown lower legs and ankles, bilaterally. DP pulses +1 bilaterally.    Neuro: CN II-XII grossly intact. Muscle strength 5/5 upper and lower extremities, bilaterally. Sensation to feet intact. Grossly moves all four extremities with smoothness and coordination.      Discharge Instructions   Increase activity slowly    Complete by:  As directed             Medication List    STOP taking these medications       furosemide 40 MG tablet  Commonly known as:  LASIX     hydrochlorothiazide 25 MG tablet  Commonly known as:  HYDRODIURIL     pioglitazone-metformin 15-850 MG per tablet  Commonly known as:  ACTOPLUS MET     telmisartan 80 MG tablet  Commonly known as:  MICARDIS      TAKE these medications       aspirin 81 MG  EC tablet  Take 1 tablet (81 mg total) by mouth daily.     carvedilol 6.25 MG tablet  Commonly known as:  COREG  Take 1 tablet (6.25 mg total) by mouth 2 (two) times daily with a meal.     dorzolamide-timolol 22.3-6.8 MG/ML ophthalmic solution  Commonly known as:  COSOPT  Place 1 drop into both eyes 2 (two) times daily.     hydrALAZINE 10 MG tablet  Commonly known as:  APRESOLINE  Take 1 tablet (10 mg total) by mouth every 8 (eight)  hours.     insulin detemir 100 UNIT/ML injection  Commonly known as:  LEVEMIR  Inject 25 Units into the skin at bedtime.     isosorbide mononitrate 30 MG 24 hr tablet  Commonly known as:  IMDUR  Take 1 tablet (30 mg total) by mouth daily.     meclizine 12.5 MG tablet  Commonly known as:  ANTIVERT  Take 1 tablet (12.5 mg total) by mouth 3 (three) times daily as needed for dizziness or nausea.     nicotine 21 mg/24hr patch  Commonly known as:  NICODERM CQ - dosed in mg/24 hours  Place 1 patch (21 mg total) onto the skin daily.     simvastatin 20 MG tablet  Commonly known as:  ZOCOR  Take 1 tablet (20 mg total) by mouth daily at 6 PM.     SYSTANE BALANCE OP  Apply 1 drop to eye 4 (four) times daily as needed (dry eyes).       No Known Allergies Follow-up Information   Follow up with OSEI-BONSU,GEORGE, MD. Schedule an appointment as soon as possible for a visit in 1 week. (neds renal function monitored)    Specialty:  Internal Medicine   Contact information:   [address] [place] [address] [phone]       Follow up with [facility], MD In 4 weeks.   Specialty:  Nephrology   Contact information:   [place] [address] [phone]       Follow up with Ermalinda Barrios, PA-C. [facility] 02/10/14 at 11:15am)    Specialty:  Cardiology   Contact information:   [address] [place] [address] [phone]       Follow up with [place]. [facility] and RN)    Contact information:   [address] [place] [address] [phone]        The results of significant diagnostics from this hospitalization (including imaging, microbiology, ancillary and laboratory) are listed below for reference.    Significant Diagnostic Studies: Ct Head Wo Contrast 01/21/2014   CLINICAL DATA:  Dizziness with nausea.  Possible dehydration.  EXAM: CT HEAD  WITHOUT CONTRAST  TECHNIQUE: Contiguous axial images were obtained from the base of the skull through the vertex without intravenous contrast.  COMPARISON:  None.  FINDINGS: There is no evidence of acute intracranial hemorrhage, mass lesion, brain edema or extra-axial fluid collection. The ventricles and subarachnoid spaces are appropriately sized for age. There is no CT evidence of acute cortical infarction. Minimal fat density is noted along the interhemispheric fissure.  The visualized paranasal sinuses, mastoid air cells and middle ears are clear. The calvarium is intact.  IMPRESSION: Unremarkable noncontrast head CT.   Electronically Signed  By: Camie Patience M.D.   On: 01/21/2014 10:33    Mr Brain Wo Contrast 01/21/2014   CLINICAL DATA:  Weakness and dizziness. Not able to ambulate. Diabetic hypertensive patient with renal disorder.  EXAM: MRI HEAD WITHOUT CONTRAST  MRA HEAD WITHOUT CONTRAST  TECHNIQUE: Multiplanar, multiecho pulse sequences of the brain and surrounding structures were obtained without intravenous contrast. Angiographic images of the head were obtained using MRA technique without contrast.  COMPARISON:  01/21/2014 head CT.  No comparison brain MR.  FINDINGS: MRI HEAD FINDINGS  No acute infarct.  Left parietal region extra-axial lenticular shaped 3.5 x 2.8 x 1 cm lesion has an appearance suggestive of a meningioma. No evidence to suggest this represents a posttraumatic blood collection. When this is evaluated on follow-up imaging, contrast enhanced imaging may be considered.  Mild small vessel disease type changes.  No hydrocephalus.  Major intracranial vascular structures are patent.  Exophthalmos.  C3-4 bulge with mild spinal stenosis.  Partially empty sella which does not appear to be extended.  Cervical medullary junction unremarkable.  MRA HEAD FINDINGS  Mild narrowing supraclinoid segment of the internal carotid artery more notable on the left.  High grade focal stenosis A1 segment  left anterior cerebral artery.  Mild narrowing A1 segment right anterior cerebral artery.  Mild to moderate narrowing distal M1 segment left middle cerebral artery.  Moderate to marked narrowing distal M1 segment right middle cerebral artery/right middle cerebral artery bifurcation.  Middle cerebral artery mild to moderate branch vessel irregularity bilaterally.  A2 segment anterior cerebral artery narrowing and irregularity greater on the right.  Ectatic vertebral arteries with mild to slightly moderate narrowing.  Full extent of the posterior inferior cerebellar arteries not imaged.  Mild to moderate narrowing of portions of the ectatic basilar artery.  Anterior circulation contributes to the posterior cerebral artery supply and therefore subsequent small vertebrobasilar system.  Nonvisualized anterior inferior cerebellar arteries and left superior cerebellar artery with narrowed right superior cerebral artery.  Moderate to marked regions of narrowing involving the portions of the posterior cerebral artery bilaterally.  No aneurysm detected on this slightly motion degraded exam.  IMPRESSION: MRI HEAD:  No acute infarct.  Left parietal region extra-axial lenticular shaped 3.5 x 2.8 x 1 cm lesion has an appearance suggestive of a meningioma. No evidence to suggest this represents a posttraumatic blood collection. When this is evaluated on follow-up imaging, contrast enhanced imaging may be considered.  Mild small vessel disease type changes.  Exophthalmos.  C3-4 bulge with mild spinal stenosis.  MRA HEAD:  Exam is motion degraded which may accentuate above described intracranial atherosclerotic type changes.   Electronically Signed   By: Chauncey Cruel M.D.   On: 01/21/2014 17:14   Nm Myocar Multi W/spect W/wall Motion / Ef 01/24/2014   CLINICAL DATA:  Chest pain, history of cardiomyopathy of uncertain etiology. History of diabetes, hypertension, chronic stage IV renal disease  EXAM: MYOCARDIAL IMAGING WITH SPECT  (REST AND PHARMACOLOGIC-STRESS - 2 DAY PROTOCOL)  GATED LEFT VENTRICULAR WALL MOTION STUDY  LEFT VENTRICULAR EJECTION FRACTION  TECHNIQUE: Standard myocardial SPECT imaging was performed after resting intravenous injection of 10 mCi [id number]. Subsequently, on a second day, intravenous infusion of Lexiscan was performed under the supervision of the Cardiology staff. At peak effect of the drug, 30 mCi [id number] was injected intravenously and standard myocardial SPECT imaging was performed. Quantitative gated imaging was also performed to evaluate left ventricular wall motion, and estimate left ventricular ejection fraction.  COMPARISON:  Chest radiograph - 09/01/2008  FINDINGS: Review of the rotational raw images demonstrates focal contamination involving the left chest wall and right buttocks on the rest images and contamination involving the right axilla and mid back on the stress images. There is mild breast attenuation artifact. No significant patient motion artifact  SPECT imaging demonstrates matched area of non perfusion involving the inferior wall of the left ventricle worrisome for prior infarction. This area of non perfusion involving the inferior wall were since on the provided stress images and osseus worrisome for an area of peri-infarct ischemia. Additional, there is mismatched decreased perfusion involving the anterior and lateral walls of the left ventricle on the provided stress images also worrisome for additional areas of pharmacologically induced ischemia.  Quantitative gated analysis demonstrates mild global hypokinesia without geographic wall motion abnormality.  The resting left ventricular ejection fraction is 11% with end-diastolic volume of 914 ml and end-systolic volume of 78 ml.  IMPRESSION: 1. Suspected prior infarction involving the inferior wall of the left ventricle with associated minimal amount of peri-infarct ischemia. 2. Suspected pharmacologically induced ischemia  involving the anterior and lateral walls of the left ventricle. 3. Mild global hypokinesia without geographic wall motion abnormality. Ejection fraction - 41%. These results will be called to the ordering clinician or representative by the Radiologist Assistant, and communication documented in the PACS or zVision Dashboard.   Electronically Signed   By: Sandi Mariscal M.D.   On: 01/24/2014 15:37    2D Echocardiogram with contrast  01/22/2014 Study Conclusions  - Left ventricle: The cavity size was normal. There was moderate concentric hypertrophy. Systolic function was severely reduced. The estimated ejection fraction was in the range of 25% to 30%. Wall motion was normal; there were no regional wall motion abnormalities. Doppler parameters are consistent with abnormal left ventricular relaxation (grade 1 diastolic dysfunction). There was no evidence of elevated ventricular filling pressure by Doppler parameters. - Aortic valve: Trileaflet; normal thickness leaflets. There was no regurgitation. - Aortic root: The aortic root was normal in size. - Mitral valve: Calcified annulus. Mildly thickened leaflets . There was no regurgitation. - Right ventricle: Systolic function was normal. - Right atrium: The atrium was normal in size. - Tricuspid valve: There was trivial regurgitation. - Pulmonary arteries: Systolic pressure was within the normal range. - Pericardium, extracardiac: There was no pericardial effusion.  Impressions:  - Normal left ventricular size, severely decreased LVEF 25-30% with diffuse hypokinesis. Impaired relaxation with elevated filling pressures. Normal RV size and function. Normal RVSP.     Microbiology: Recent Results (from the past 240 hour(s))  CULTURE, BLOOD (ROUTINE X 2)     Status: None   Collection Time    01/21/14  4:34 PM      Result Value Ref Range Status   Specimen Description BLOOD RIGHT HAND   Final   Special Requests BOTTLES DRAWN AEROBIC ONLY  3CC   Final   Culture  Setup Time     Final   Value: 01/22/2014 01:49     Performed at [facility]   Culture     Final   Value: NO GROWTH 5 DAYS     Performed at [facility]   Report Status 01/28/2014 FINAL   Final  CULTURE, BLOOD (ROUTINE X 2)     Status: None   Collection Time    01/21/14  5:15 PM      Result Value Ref Range Status   Specimen Description BLOOD LEFT ARM  Final   Special Requests BOTTLES DRAWN AEROBIC AND ANAEROBIC 8CC   Final   Culture  Setup Time     Final   Value: 01/22/2014 01:50     Performed at [facility]   Culture     Final   Value: NO GROWTH 5 DAYS     Performed at [facility]   Report Status 01/28/2014 FINAL   Final  URINE CULTURE     Status: None   Collection Time    01/22/14  5:20 AM      Result Value Ref Range Status   Specimen Description URINE, RANDOM   Final   Special Requests NONE   Final   Culture  Setup Time     Final   Value: 01/22/2014 08:40     Performed at [place]     Final   Value: >=100,000 COLONIES/ML     Performed at [facility]   Culture     Final   Value: Multiple bacterial morphotypes present, none predominant. Suggest appropriate recollection if clinically indicated.     Performed at [facility]   Report Status 01/23/2014 FINAL   Final     Labs: Basic Metabolic Panel:  Recent Labs Lab 01/26/14 0530 01/27/14 0453 01/28/14 0224 01/29/14 0406 01/30/14 0435  NA 139 141 139 141 139  K 4.7 4.9 5.1 4.9 5.1  CL 102 103 104 102 102  CO2 _0 GLUCOSE 97 111* 218* 148* 110*  BUN 52* 52* 49* 53* 49*  CREATININE 1.88* 1.86* 1.82* 2.20* 2.02*  CALCIUM 7.8* 8.2* 8.3* 9.0 9.0   Liver Function Tests:  Recent Labs Lab 01/28/14 0224  AST 19  ALT 9  ALKPHOS 109  BILITOT <0.2*  PROT 6.3  ALBUMIN 2.9*   CBC:  Recent Labs Lab 01/26/14 0530 01/28/14 0927  WBC 3.5* 3.9*  HGB 9.9* 10.7*  HCT 32.1* 34.5*  MCV 88.7 90.1  PLT  251 256     Recent Labs  01/21/14 1020 01/21/14 1732  PROBNP 3351.0* 4302.0*   CBG:  Recent Labs Lab 01/29/14 0806 01/29/14 1711 01/29/14 2126 01/30/14 0759 01/30/14 1158  GLUCAP 133* 197* 273* 126* 221*       Signed:   [facility] 01/30/2014, 2:14 PM

## 2014-01-31 NOTE — Telephone Encounter (Signed)
Pt has an appt 02-10-14 with the pa

## 2014-02-10 ENCOUNTER — Encounter: Payer: PRIVATE HEALTH INSURANCE | Admitting: Physician Assistant

## 2014-02-12 ENCOUNTER — Ambulatory Visit (INDEPENDENT_AMBULATORY_CARE_PROVIDER_SITE_OTHER): Payer: PRIVATE HEALTH INSURANCE | Admitting: Physician Assistant

## 2014-02-12 ENCOUNTER — Encounter: Payer: Self-pay | Admitting: Physician Assistant

## 2014-02-12 VITALS — BP 130/70 | HR 71 | Ht 63.0 in | Wt 192.0 lb

## 2014-02-12 DIAGNOSIS — I2510 Atherosclerotic heart disease of native coronary artery without angina pectoris: Secondary | ICD-10-CM

## 2014-02-12 DIAGNOSIS — N183 Chronic kidney disease, stage 3 unspecified: Secondary | ICD-10-CM

## 2014-02-12 DIAGNOSIS — F17200 Nicotine dependence, unspecified, uncomplicated: Secondary | ICD-10-CM

## 2014-02-12 DIAGNOSIS — E785 Hyperlipidemia, unspecified: Secondary | ICD-10-CM

## 2014-02-12 DIAGNOSIS — E1129 Type 2 diabetes mellitus with other diabetic kidney complication: Secondary | ICD-10-CM

## 2014-02-12 DIAGNOSIS — I429 Cardiomyopathy, unspecified: Secondary | ICD-10-CM

## 2014-02-12 DIAGNOSIS — I5042 Chronic combined systolic (congestive) and diastolic (congestive) heart failure: Secondary | ICD-10-CM

## 2014-02-12 DIAGNOSIS — E1122 Type 2 diabetes mellitus with diabetic chronic kidney disease: Secondary | ICD-10-CM

## 2014-02-12 DIAGNOSIS — Z720 Tobacco use: Secondary | ICD-10-CM

## 2014-02-12 LAB — BASIC METABOLIC PANEL
BUN: 57 mg/dL — AB (ref 6–23)
CHLORIDE: 105 meq/L (ref 96–112)
CO2: 22 meq/L (ref 19–32)
CREATININE: 2.2 mg/dL — AB (ref 0.4–1.2)
Calcium: 8.7 mg/dL (ref 8.4–10.5)
GFR: 28 mL/min — ABNORMAL LOW (ref >60.00)
Glucose, Bld: 199 mg/dL — ABNORMAL HIGH (ref 70–99)
Potassium: 5.1 mEq/L (ref 3.5–5.1)
Sodium: 139 mEq/L (ref 135–145)

## 2014-02-12 NOTE — Patient Instructions (Signed)
Your physician recommends that you continue on your current medications as directed. Please refer to the Current Medication list given to you today.  Your physician recommends that you go to the lab today for a BMET  Your physician recommends that you schedule a follow-up appointment in: 1 month with [place] physician recommends that you schedule a follow-up appointment in: October with Dr Johnsie Cancel

## 2014-02-12 NOTE — Progress Notes (Signed)
Cardiology Office Note    Date:  02/12/2014   ID:  ALEYCIA AUGUSTIN, DOB 1945-10-15, MRN [id number]  PCP:  Benito Mccreedy, MD  Cardiologist:  Dr. Jenkins Rouge   Nephrologist:  Dr. Florene Glen   History of Present Illness: Kathleen [name] is a 68 y.o. female with a hx of HTN, DM, HL, CKD (baseline Cr ~ 1.8), tobacco abuse.  She was admitted 6/16-6/25. She initially presented with vertigo symptoms. Blood pressure was elevated and she was worked up for possible TIA. Brain MRI demonstrated incidental finding of a 3.5 x 2.8 cm meningioma. Carotid US demonstrated just mild to mod ICA stenosis. Echocardiogram did demonstrate new cardiomyopathy with an EF of 25-30%. Given her renal disease, cardiac cath was initially avoided and she underwent nuclear stress test. This was abnormal with anterior and lateral ischemia as well as inferior scar with minimal peri-infarct ischemia.  She ultimately underwent cardiac catheterization which demonstrated moderate to severe mid RCA stenosis and no significant CAD in the circumflex or LAD. Medical therapy was favored. Medications were adjusted. She was taken off of her ARB to reduce the risk of contrast-induced nephropathy. This could be resumed post discharge if creatinine remained stable. She will need reassessment of her ejection fraction in 90 days. If EF remains less than 35%, she will need referral for ICD.  Since d/c, she is doing well.  The patient denies chest pain, shortness of breath, syncope, orthopnea, PND or significant pedal edema.    Studies:  - LHC (01/27/14):  Mid RCA 70% then 50%. Medical therapy favored.  - Echo (01/22/14):  Moderate LVH, EF 25-30%, diffuse HK (no RWMA), grade 1 diastolic dysfunction, normal RV function,  - Nuclear (01/23/14):  Inferior scar with minimal peri-infarct ischemia, anterior and lateral ischemia, EF 41%  - Carotid US (01/22/14):  Bilateral ICA 1-39% by end diast velocities/plaque morphology/ICA-CCA ratio (123456 by peak  systolic velocity)   Recent Labs: 01/21/2014: HDL Cholesterol by NMR 53; LDL (calc) 173*; Pro B Natriuretic peptide (BNP) 4302.0*; TSH 0.661  01/28/2014: ALT 9; Hemoglobin 10.7*  01/30/2014: Creatinine 2.02*; Potassium 5.1   Wt Readings from Last 3 Encounters:  02/12/14 192 lb (87.091 kg)  01/30/14 202 lb 6.4 oz (91.808 kg)  01/30/14 202 lb 6.4 oz (91.808 kg)     Past Medical History  Diagnosis Date  . Diabetes mellitus without complication   . CKD (chronic kidney disease), stage III     a. Borderline III-IV.  [name] Hypertension   . CAD (coronary artery disease)     a. Cath 01/2014: mild in LAD, mild-mod LCx, mod-severe RCA - for med rx.  . Systolic CHF     a. Q000111Q: dx with mixed ICM/NICM (out of proportion to CAD) EF 25-30% by echo.   . Hyperlipidemia   . Anemia     a. 01/2014: suspected of chronic disease, negative FOBT.  [name] Meningioma     a. Incidental dx 01/2014.  . Obesity     Current Outpatient Prescriptions  Medication Sig Dispense Refill  . aspirin 81 MG EC tablet Take 1 tablet (81 mg total) by mouth daily.  30 tablet  3  . carvedilol (COREG) 6.25 MG tablet Take 1 tablet (6.25 mg total) by mouth 2 (two) times daily with a meal.  60 tablet  2  . dorzolamide-timolol (COSOPT) 22.3-6.8 MG/ML ophthalmic solution Place 1 drop into both eyes 2 (two) times daily.      . furosemide (LASIX) 40 MG tablet       .  hydrALAZINE (APRESOLINE) 10 MG tablet Take 1 tablet (10 mg total) by mouth every 8 (eight) hours.  90 tablet  0  . insulin detemir (LEVEMIR) 100 UNIT/ML injection Inject 25 Units into the skin at bedtime.      . isosorbide mononitrate (IMDUR) 30 MG 24 hr tablet Take 1 tablet (30 mg total) by mouth daily.  30 tablet  0  . meclizine (ANTIVERT) 12.5 MG tablet Take 1 tablet (12.5 mg total) by mouth 3 (three) times daily as needed for dizziness or nausea.  30 tablet  0  . Propylene Glycol (SYSTANE BALANCE OP) Apply 1 drop to eye 4 (four) times daily as needed (dry eyes).      .  simvastatin (ZOCOR) 20 MG tablet Take 1 tablet (20 mg total) by mouth daily at 6 PM.  30 tablet  0  . triamterene-hydrochlorothiazide (MAXZIDE-25) 37.5-25 MG per tablet Take 1 tablet by mouth. FLUID RETENTION       No current facility-administered medications for this visit.    Allergies:   Review of patient's allergies indicates no known allergies.   Social History:  The patient  reports that she has been smoking Cigarettes.  She has a 7.5 pack-year smoking history. She has never used smokeless tobacco. She reports that she does not drink alcohol or use illicit drugs.   Family History:  The patient's family history includes Diabetic kidney disease in her mother; Heart failure in her mother; Hypertension in her father and mother.   ROS:  Please see the history of present illness.      All other systems reviewed and negative.   PHYSICAL EXAM: VS:  BP 130/70  Pulse 71  Ht 5\' 3"  (1.6 m)  Wt 192 lb (87.091 kg)  BMI 34.02 kg/m2 Well nourished, well developed, in no acute distress HEENT: normal Neck: no JVD Cardiac:  normal S1, S2; RRR; no murmur Lungs:  clear to auscultation bilaterally, no wheezing, rhonchi or rales Abd: soft, nontender, no hepatomegaly Ext: trace bilateral LE edemawith chronic changes Skin: warm and dry Neuro:  CNs 2-12 intact, no focal abnormalities noted  EKG:  NSR, HR 71, normal axis, lateral TWI, no significant change from prior tracing     ASSESSMENT AND PLAN:  1. Mixed ischemic/nonischemic cardiomyopathy:  Continue beta blocker, hydralazine, nitrates.  Check BMET.  If creatinine stable/improved, resume Micardis 80 mg QD.  If ARB resumed, will need close follow up on creatinine.  Consider changing Maxzide to Spironolactone in the future.  Plan Echo ~ 90 days.  If EF < 35%, will need referral to EP for possible ICD.  This was reviewed with the patient and her husband today.   2. Chronic combined systolic and diastolic heart failure:  Volume stable.  Continue  current Rx.  Check BMET today.   3. Coronary artery disease:  No angina.  Continue ASA, beta blocker, nitrates, statin.  4. CKD stage 3 due to type 2 diabetes mellitus:  Check BMET today.  She f/u with Dr. Florene Glen.   5. Hyperlipidemia:  Continue statin.  6. Tobacco abuse:  She has quit smoking. 7. Disposition:  F/u with me in 1 month and Dr. Jenkins Rouge in October 2015.     Signed, Versie Starks, MHS 02/12/2014 11:57 AM    [facility] [place], [place], [place]  [address] Phone: [phone]; Fax: [phone]

## 2014-02-13 ENCOUNTER — Telehealth: Payer: Self-pay | Admitting: *Deleted

## 2014-02-13 DIAGNOSIS — N183 Chronic kidney disease, stage 3 unspecified: Secondary | ICD-10-CM

## 2014-02-13 DIAGNOSIS — I5042 Chronic combined systolic (congestive) and diastolic (congestive) heart failure: Secondary | ICD-10-CM

## 2014-02-13 DIAGNOSIS — E1122 Type 2 diabetes mellitus with diabetic chronic kidney disease: Secondary | ICD-10-CM

## 2014-02-13 NOTE — Telephone Encounter (Signed)
lmptcb for lab results 

## 2014-02-14 NOTE — Telephone Encounter (Signed)
s/w pt's daughter who gave me new cell # [phone]. I tried # but could not lmom. I will try again next week to reach pt about results. I did send message into MY CHART as well.

## 2014-02-17 NOTE — Telephone Encounter (Signed)
pt notified about lab results and to make sure NOT to take the micardis at this time due to creatinine increased. Pt verbalized understanding and repeat bmet 7/27.

## 2014-02-28 DIAGNOSIS — L0390 Cellulitis, unspecified: Principal | ICD-10-CM

## 2014-02-28 DIAGNOSIS — L0291 Cutaneous abscess, unspecified: Secondary | ICD-10-CM | POA: Insufficient documentation

## 2014-03-03 ENCOUNTER — Other Ambulatory Visit (INDEPENDENT_AMBULATORY_CARE_PROVIDER_SITE_OTHER): Payer: PRIVATE HEALTH INSURANCE

## 2014-03-03 DIAGNOSIS — I5042 Chronic combined systolic (congestive) and diastolic (congestive) heart failure: Secondary | ICD-10-CM

## 2014-03-03 DIAGNOSIS — E1122 Type 2 diabetes mellitus with diabetic chronic kidney disease: Secondary | ICD-10-CM

## 2014-03-03 DIAGNOSIS — N183 Chronic kidney disease, stage 3 unspecified: Secondary | ICD-10-CM

## 2014-03-03 DIAGNOSIS — E1129 Type 2 diabetes mellitus with other diabetic kidney complication: Secondary | ICD-10-CM

## 2014-03-03 LAB — BASIC METABOLIC PANEL
BUN: 73 mg/dL — ABNORMAL HIGH (ref 6–23)
CALCIUM: 8.1 mg/dL — AB (ref 8.4–10.5)
CO2: 30 mEq/L (ref 19–32)
Chloride: 101 mEq/L (ref 96–112)
Creatinine, Ser: 2.2 mg/dL — ABNORMAL HIGH (ref 0.4–1.2)
GFR: 28.74 mL/min — AB (ref >60.00)
GLUCOSE: 139 mg/dL — AB (ref 70–99)
Potassium: 4 mEq/L (ref 3.5–5.1)
Sodium: 139 mEq/L (ref 135–145)

## 2014-03-07 ENCOUNTER — Encounter: Payer: Self-pay | Admitting: *Deleted

## 2014-03-17 ENCOUNTER — Ambulatory Visit (INDEPENDENT_AMBULATORY_CARE_PROVIDER_SITE_OTHER): Payer: PRIVATE HEALTH INSURANCE | Admitting: Physician Assistant

## 2014-03-17 ENCOUNTER — Encounter: Payer: Self-pay | Admitting: Physician Assistant

## 2014-03-17 VITALS — BP 145/78 | HR 69 | Ht 63.0 in | Wt 186.0 lb

## 2014-03-17 DIAGNOSIS — E785 Hyperlipidemia, unspecified: Secondary | ICD-10-CM

## 2014-03-17 DIAGNOSIS — N183 Chronic kidney disease, stage 3 unspecified: Secondary | ICD-10-CM

## 2014-03-17 DIAGNOSIS — I2510 Atherosclerotic heart disease of native coronary artery without angina pectoris: Secondary | ICD-10-CM

## 2014-03-17 DIAGNOSIS — I5042 Chronic combined systolic (congestive) and diastolic (congestive) heart failure: Secondary | ICD-10-CM

## 2014-03-17 DIAGNOSIS — E1122 Type 2 diabetes mellitus with diabetic chronic kidney disease: Secondary | ICD-10-CM

## 2014-03-17 DIAGNOSIS — I429 Cardiomyopathy, unspecified: Secondary | ICD-10-CM

## 2014-03-17 DIAGNOSIS — E1129 Type 2 diabetes mellitus with other diabetic kidney complication: Secondary | ICD-10-CM

## 2014-03-17 MED ORDER — HYDRALAZINE HCL 10 MG PO TABS
10.0000 mg | ORAL_TABLET | Freq: Three times a day (TID) | ORAL | Status: DC
Start: 2014-03-17 — End: 2014-03-17

## 2014-03-17 MED ORDER — ISOSORBIDE MONONITRATE ER 30 MG PO TB24: 30.0000 mg | ORAL_TABLET | Freq: Every day | ORAL | Status: DC

## 2014-03-17 MED ORDER — HYDRALAZINE HCL 10 MG PO TABS: 10.0000 mg | ORAL_TABLET | Freq: Three times a day (TID) | ORAL | Status: DC

## 2014-03-17 MED ORDER — SIMVASTATIN 20 MG PO TABS: 20.0000 mg | ORAL_TABLET | Freq: Every day | ORAL | Status: DC

## 2014-03-17 NOTE — Progress Notes (Signed)
Cardiology Office Note    Date:  03/17/2014   ID:  Kathleen [name], DOB 11-23-1945, MRN [id number]  PCP:  Benito Mccreedy, MD  Cardiologist:  Dr. Jenkins Rouge   Nephrologist:  Dr. Florene Glen   History of Present Illness: Kathleen [name] is a 68 y.o. female with a hx of HTN, DM, HL, CKD (baseline Cr ~ 1.8), tobacco abuse.  Admitted 6/16-6/25 with vertigo symptoms in setting of elevated BP.  Brain MRI with incidental finding of a 3.5 x 2.8 cm meningioma.  Echocardiogram demonstrated new cardiomyopathy with an EF of 25-30%.  Myoview was abnormal with anterior and lateral ischemia as well as inferior scar with minimal peri-infarct ischemia.  LHC demonstrated moderate to severe mid RCA stenosis and no significant CAD in the circumflex or LAD. Medical therapy was favored.  ARB had been held to reduce the risk of contrast-induced nephropathy.  I saw her in follow up 02/12/14. Follow up lab work demonstrated that creatinine remained slightly elevated. I decided to hold off on resuming her ARB. She returns for follow up.  She returns for follow up.  She had a viral illness this weekend and had some nausea and diarrhea.  She feels better now.  Breathing is stable.  She is NYHA 2-2b.  No orthopnea, PND.  No chest pain. No syncope.  LE edema is stable.  She has a wound on her R leg and is to go see the wound clinic soon.     Studies:  - LHC (01/27/14):  Mid RCA 70% then 50%. Medical therapy favored.  - Echo (01/22/14):  Moderate LVH, EF 25-30%, diffuse HK (no RWMA), grade 1 diastolic dysfunction, normal RV function,  - Nuclear (01/23/14):  Inferior scar with minimal peri-infarct ischemia, anterior and lateral ischemia, EF 41%  - Carotid US (01/22/14):  Bilateral ICA 1-39% by end diast velocities/plaque morphology/ICA-CCA ratio (123456 by peak systolic velocity)   Recent Labs: 01/21/2014: HDL Cholesterol by NMR 53; LDL (calc) 173*; Pro B Natriuretic peptide (BNP) 4302.0*; TSH 0.661  01/28/2014: ALT 9;  Hemoglobin 10.7*  03/03/2014: Creatinine 2.2*; Potassium 4.0   Wt Readings from Last 3 Encounters:  02/12/14 192 lb (87.091 kg)  01/30/14 202 lb 6.4 oz (91.808 kg)  01/30/14 202 lb 6.4 oz (91.808 kg)     Past Medical History  Diagnosis Date  . Diabetes mellitus without complication   . CKD (chronic kidney disease), stage III     a. Borderline III-IV.  [name] Hypertension   . CAD (coronary artery disease)     a. Cath 01/2014: mild in LAD, mild-mod LCx, mod-severe RCA - for med rx.  . Systolic CHF     a. Q000111Q: dx with mixed ICM/NICM (out of proportion to CAD) EF 25-30% by echo.   . Hyperlipidemia   . Anemia     a. 01/2014: suspected of chronic disease, negative FOBT.  [name] Meningioma     a. Incidental dx 01/2014.  . Obesity     Current Outpatient Prescriptions  Medication Sig Dispense Refill  . aspirin 81 MG EC tablet Take 1 tablet (81 mg total) by mouth daily.  30 tablet  3  . carvedilol (COREG) 6.25 MG tablet Take 1 tablet (6.25 mg total) by mouth 2 (two) times daily with a meal.  60 tablet  2  . dorzolamide-timolol (COSOPT) 22.3-6.8 MG/ML ophthalmic solution Place 1 drop into both eyes 2 (two) times daily.      . furosemide (LASIX) 40 MG tablet       .  hydrALAZINE (APRESOLINE) 10 MG tablet Take 1 tablet (10 mg total) by mouth every 8 (eight) hours.  90 tablet  0  . insulin detemir (LEVEMIR) 100 UNIT/ML injection Inject 25 Units into the skin at bedtime.      . isosorbide mononitrate (IMDUR) 30 MG 24 hr tablet Take 1 tablet (30 mg total) by mouth daily.  30 tablet  0  . meclizine (ANTIVERT) 12.5 MG tablet Take 1 tablet (12.5 mg total) by mouth 3 (three) times daily as needed for dizziness or nausea.  30 tablet  0  . Propylene Glycol (SYSTANE BALANCE OP) Apply 1 drop to eye 4 (four) times daily as needed (dry eyes).      . simvastatin (ZOCOR) 20 MG tablet Take 1 tablet (20 mg total) by mouth daily at 6 PM.  30 tablet  0  . triamterene-hydrochlorothiazide (MAXZIDE-25) 37.5-25 MG per  tablet Take 1 tablet by mouth. FLUID RETENTION       No current facility-administered medications for this visit.    Allergies:   Review of patient's allergies indicates no known allergies.   Social History:  The patient  reports that she has been smoking Cigarettes.  She has a 7.5 pack-year smoking history. She has never used smokeless tobacco. She reports that she does not drink alcohol or use illicit drugs.   Family History:  The patient's family history includes Diabetic kidney disease in her mother; Heart failure in her mother; Hypertension in her father and mother.   ROS:  Please see the history of present illness.      All other systems reviewed and negative.   PHYSICAL EXAM: VS:  BP 145/78  Pulse 69  Ht 5\' 3"  (1.6 m)  Wt 186 lb (84.369 kg)  BMI 32.96 kg/m2 Well nourished, well developed, in no acute distress HEENT: normal Neck: no JVD Cardiac:  normal S1, S2; RRR; no murmur Lungs:  clear to auscultation bilaterally, no wheezing, rhonchi or rales Abd: soft, nontender, no hepatomegaly Ext: trace - 1+ bilateral LE edemawith chronic changes; abrasion noted R shin Skin: warm and dry Neuro:  CNs 2-12 intact, no focal abnormalities noted  EKG:  NSR, HR 69, normal axis, NSSTTW changes    ASSESSMENT AND PLAN:  Mixed ischemic/nonischemic cardiomyopathy:  She has been out of hydralazine and isosorbide for several days.  Continue beta blocker.  Resume  hydralazine, nitrates.  I have been trying to decide if her renal function would tolerate resumption of her ARB.  Will plan on BMET in 1 week with BP check with the RN.  If BP is able to tolerate and Creatinine is stable, I will likely try to start her back on Micardis at 40 mg QD with close follow up on her renal function (BMET 3 days and 10 days after starting).  Plan Echo ~ 90 days.  If EF < 35%, will need referral to EP for possible ICD.    Chronic combined systolic and diastolic heart failure:  Volume stable.  Continue current Rx.     Coronary artery disease:  No angina.  Continue ASA, beta blocker, nitrates, statin.   CKD stage 3 due to type 2 diabetes mellitus:  As noted, check BMET in 1 week.  Resume ARB if Creatinine stable and BP can tolerate with close follow up on labs.     Hyperlipidemia:  Continue statin.   Disposition:   F/u with Dr. Jenkins Rouge in 2 mos.  Will check echo prior to follow OV.  Signed, Versie Starks, MHS 03/17/2014 9:31 AM    [facility] [place], [place], [place]  [address] Phone: [phone]; Fax: [phone]

## 2014-03-17 NOTE — Patient Instructions (Addendum)
Refilled hydralazine, imdur and simvastatin.  Your physician recommends that you return for lab work in: 1 week for Bmet.   Your physician recommends that you schedule a follow-up appointment in: 1 week for Nurse Visit BP Check.  Your physician has requested that you have an echocardiogram 1 week before appt with Dr. Johnsie Cancel. Echocardiography is a painless test that uses sound waves to create images of your heart. It provides your doctor with information about the size and shape of your heart and how well your heart's chambers and valves are working. This procedure takes approximately one hour. There are no restrictions for this procedure.  Your physician recommends that you schedule a follow-up appointment in: 2 month with Dr. Johnsie Cancel, Echo 1 week prior to appt.

## 2014-03-21 ENCOUNTER — Other Ambulatory Visit (HOSPITAL_COMMUNITY): Payer: PRIVATE HEALTH INSURANCE

## 2014-03-24 DIAGNOSIS — L0390 Cellulitis, unspecified: Principal | ICD-10-CM

## 2014-03-24 DIAGNOSIS — I10 Essential (primary) hypertension: Secondary | ICD-10-CM

## 2014-03-24 DIAGNOSIS — E1159 Type 2 diabetes mellitus with other circulatory complications: Secondary | ICD-10-CM | POA: Insufficient documentation

## 2014-03-24 DIAGNOSIS — E559 Vitamin D deficiency, unspecified: Secondary | ICD-10-CM | POA: Insufficient documentation

## 2014-03-24 DIAGNOSIS — L0291 Cutaneous abscess, unspecified: Secondary | ICD-10-CM

## 2014-03-27 ENCOUNTER — Encounter: Payer: Self-pay | Admitting: Internal Medicine

## 2014-03-27 ENCOUNTER — Ambulatory Visit (INDEPENDENT_AMBULATORY_CARE_PROVIDER_SITE_OTHER): Payer: PRIVATE HEALTH INSURANCE | Admitting: Nurse Practitioner

## 2014-03-27 ENCOUNTER — Telehealth: Payer: Self-pay | Admitting: *Deleted

## 2014-03-27 ENCOUNTER — Ambulatory Visit (INDEPENDENT_AMBULATORY_CARE_PROVIDER_SITE_OTHER): Payer: PRIVATE HEALTH INSURANCE | Admitting: Internal Medicine

## 2014-03-27 VITALS — BP 150/86 | HR 64 | Resp 16 | Wt 184.5 lb

## 2014-03-27 DIAGNOSIS — I10 Essential (primary) hypertension: Secondary | ICD-10-CM

## 2014-03-27 DIAGNOSIS — E1122 Type 2 diabetes mellitus with diabetic chronic kidney disease: Secondary | ICD-10-CM

## 2014-03-27 DIAGNOSIS — R609 Edema, unspecified: Secondary | ICD-10-CM

## 2014-03-27 DIAGNOSIS — E1129 Type 2 diabetes mellitus with other diabetic kidney complication: Secondary | ICD-10-CM

## 2014-03-27 DIAGNOSIS — L608 Other nail disorders: Secondary | ICD-10-CM

## 2014-03-27 DIAGNOSIS — L97409 Non-pressure chronic ulcer of unspecified heel and midfoot with unspecified severity: Secondary | ICD-10-CM

## 2014-03-27 DIAGNOSIS — N183 Chronic kidney disease, stage 3 unspecified: Secondary | ICD-10-CM

## 2014-03-27 DIAGNOSIS — I5042 Chronic combined systolic (congestive) and diastolic (congestive) heart failure: Secondary | ICD-10-CM

## 2014-03-27 DIAGNOSIS — Z23 Encounter for immunization: Secondary | ICD-10-CM

## 2014-03-27 LAB — BASIC METABOLIC PANEL
BUN: 73 mg/dL — ABNORMAL HIGH (ref 6–23)
CALCIUM: 7.9 mg/dL — AB (ref 8.4–10.5)
CO2: 27 mEq/L (ref 19–32)
CREATININE: 2.3 mg/dL — AB (ref 0.4–1.2)
Chloride: 103 mEq/L (ref 96–112)
GFR: 26.74 mL/min — ABNORMAL LOW (ref >60.00)
Glucose, Bld: 115 mg/dL — ABNORMAL HIGH (ref 70–99)
Potassium: 4.2 mEq/L (ref 3.5–5.1)
Sodium: 138 mEq/L (ref 135–145)

## 2014-03-27 NOTE — Progress Notes (Signed)
Patient ID: Kathleen [name], female   DOB: Sep 26, 1945, 68 y.o.   MRN: [id number]         [facility]  Reason for Consult: Recurrent right leg cellulitis Referring Physician: Dr. Iona Beard Osei-Bonsu  Patient Active Problem List   Diagnosis Date Noted  . Cellulitis and abscess of right lower extremitiy 02/28/2014    Priority: High  . Unspecified essential hypertension   . Unspecified vitamin D deficiency   . Chronic combined systolic and diastolic heart failure 11/65/7903  . CAD (coronary artery disease)   . Obesity   . Meningioma   . Anemia   . CKD stage 3 due to type 2 diabetes mellitus 01/24/2014  . Hyperlipidemia 01/22/2014  . BPPV (benign paroxysmal positional vertigo) 01/22/2014  . Tobacco abuse 01/21/2014  . Type II or unspecified type diabetes mellitus with peripheral circulatory disorders, uncontrolled(250.72) 01/21/2014    Patient's Medications  New Prescriptions   No medications on file  Previous Medications   ASPIRIN 81 MG EC TABLET    Take 1 tablet (81 mg total) by mouth daily.   CARVEDILOL (COREG) 6.25 MG TABLET    Take 1 tablet (6.25 mg total) by mouth 2 (two) times daily with a meal.   CHOLECALCIFEROL (VITAMIN D) 1000 UNITS TABLET    Take 1,000 Units by mouth daily.   HYDRALAZINE (APRESOLINE) 10 MG TABLET    Take 1 tablet (10 mg total) by mouth every 8 (eight) hours.   INSULIN DETEMIR (LEVEMIR) 100 UNIT/ML INJECTION    Inject 25 Units into the skin at bedtime.   ISOSORBIDE MONONITRATE (IMDUR) 30 MG 24 HR TABLET    Take 1 tablet (30 mg total) by mouth daily.   MECLIZINE (ANTIVERT) 12.5 MG TABLET    Take 1 tablet (12.5 mg total) by mouth 3 (three) times daily as needed for dizziness or nausea.   PIOGLITAZONE-METFORMIN (ACTOPLUS MET) 15-850 MG PER TABLET    Take 1 tablet by mouth 2 (two) times daily with a meal.   PROPYLENE GLYCOL (SYSTANE BALANCE OP)    Apply 1 drop to eye 4 (four) times daily as needed (dry eyes).   SIMVASTATIN (ZOCOR) 20  MG TABLET    Take 1 tablet (20 mg total) by mouth daily at 6 PM.  Modified Medications   No medications on file  Discontinued Medications   NICOTINE (NICODERM CQ - DOSED IN MG/24 HOURS) 21 MG/24HR PATCH    Place 21 mg onto the skin daily.    Recommendations: 1. Observe off of antibiotics for now 2. Podiatry referral 3. Followup in 4 weeks   Assessment: She has several risk factors for recurrent cellulitis including chronic edema, foot ulcer and dystrophic nails. Her most recent bout of cellulitis has resolved so the focus now is on what can be done to prevent recurrences. I will refer her for podiatry evaluation of the fluctuant area over the first MTP joint and for further evaluation for possible onychomycosis. She may need antifungal therapy for onychomycosis if scrapings are positive. I will see her back in 4 weeks. If the superficial ulceration on her shin has healed I will encourage her to retry the compressive stockings to counteract her chronic edema.   HPI: Kathleen [name] is a 68 y.o. female who has a history of chronic right leg edema in the setting of multiple chronic medical problems. She has had recurrent right leg cellulitis over the past year. She is uncertain exactly how many episodes but thinks she  has been treated with antibiotics at least 3 times. She was last seen on July 27 with "weeping blisters" noted by Dr. Vista Lawman. She was treated with doxycycline and has improved slowly. She has noted some swelling and tenderness over her right first MTP joint for the last month. She states that she has had thickened toenails for at least the past 10 years. She did wear a compressive knee-high stocking on her right leg for a while but states that she really only used it for about 3-4 weeks. She did not mine wearing the stockings but had trouble getting them on and had to have her children help her each morning. She has no known history of DVT.  Review of Systems: Constitutional:  negative for chills, fevers and sweats    Past Medical History  Diagnosis Date  . Diabetes mellitus without complication   . CKD (chronic kidney disease), stage III     a. Borderline III-IV.  [name] Hypertension   . CAD (coronary artery disease)     a. Cath 01/2014: mild in LAD, mild-mod LCx, mod-severe RCA - for med rx.  . Systolic CHF     a. 01/2375: dx with mixed ICM/NICM (out of proportion to CAD) EF 25-30% by echo.   . Hyperlipidemia   . Anemia     a. 01/2014: suspected of chronic disease, negative FOBT.  [name] Meningioma     a. Incidental dx 01/2014.  . Obesity     History  Substance Use Topics  . Smoking status: Current Every Day Smoker -- 0.25 packs/day for 30 years    Types: Cigarettes  . Smokeless tobacco: Never Used  . Alcohol Use: No    Family History  Problem Relation Age of Onset  . Diabetic kidney disease Mother   . Hypertension Mother   . Hypertension Father   . Heart failure Mother   . Heart attack Mother   . Hyperlipidemia Mother    No Known Allergies  OBJECTIVE: Blood pressure 121/71, pulse 75, temperature 97.6 F (36.4 C), temperature source Oral, height '5\' 1"'  (1.549 m), weight 185 lb (83.915 kg). General: She is in no distress Right leg: She has 1+ pitting edema up to the level of the knee. She has some hyperpigmentation of the lower legs circumferentially. There is faint erythema but no warmth. She has one area of very superficial desquamation/ulceration on her anterior shin. There is no drainage or odor. She has a superficial ulcer with surrounding fluctuance over the right first MTP joint medially. There is no cellulitis. She has thickened, dystrophic toenails on her first and second toes of the right foot.  Microbiology: No results found for this or any previous visit (from the past 240 hour(s)).  Michel Bickers, MD [facility] [phone] pager   [phone] cell 03/27/2014, 4:50 PM

## 2014-03-27 NOTE — Telephone Encounter (Signed)
pt notified about lab results per Dr. Johnsie Cancel review to d/c triamterene/hctz, make sure to cont current dose of lasix, stay off ARB as already discussed. BMET 9/21 pt aware of lab date and time.

## 2014-03-27 NOTE — Progress Notes (Signed)
CR and BUN still high don't resume ARB  Stop triamterene / HCTZ and continue daily lasix  F/u BMET 4 weeks

## 2014-03-27 NOTE — Progress Notes (Signed)
1.) Reason for visit: BP check and BMET to determine if patient can resume ARB  2.) Name of MD requesting visit: Richardson Dopp, PA-C  3.) H&P: hx HTN, here today for recheck of BP and BMET to determine if patient can resume a previously d/c'ed ARB per Richardson Dopp, PA-C on 8/10 ov  4.) ROS related to problem: patient reports she is not feeling well but does not have any specific complaints; patient is in no acute distress and agrees with plan of care  5.) Assessment and plan per MD: plan of care will need to be determined by either Richardson Dopp, PA-C or Dr. Johnsie Cancel, patient's primary cardiologist once BMET results are reviewed  Pt's BP today is 150/86, HR 64 bpm

## 2014-03-27 NOTE — Patient Instructions (Signed)
Your healthcare provider recommends that you continue on your current medications as directed. Please refer to the Current Medication list given to you today.  Our office will call you once the results of your blood work are reviewed so that your healthcare provider can determine whether or not you need to take another medication

## 2014-03-28 ENCOUNTER — Telehealth: Payer: Self-pay | Admitting: *Deleted

## 2014-03-28 NOTE — Telephone Encounter (Signed)
Pt referred to [facility], Dr. Paulla Dolly 8/27 9:00 (arrival 8:45). Pt accepted. [place] Landis Gandy, RN

## 2014-03-30 ENCOUNTER — Telehealth: Payer: Self-pay | Admitting: Physician Assistant

## 2014-03-30 DIAGNOSIS — I10 Essential (primary) hypertension: Secondary | ICD-10-CM

## 2014-03-30 NOTE — Telephone Encounter (Signed)
Recent RN visit reviewed. BP can tolerate increasing Hydralazine. Increase Hydralazine to 25 mg 3 times a day. Richardson Dopp, PA-C   03/30/2014 6:17 AM

## 2014-04-02 MED ORDER — HYDRALAZINE HCL 25 MG PO TABS: 25.0000 mg | ORAL_TABLET | Freq: Three times a day (TID) | ORAL | Status: DC

## 2014-04-02 NOTE — Telephone Encounter (Signed)
pt advised per Brynda Rim. PA to increase hyrdalazibe to 25 mg TID, new Rx sent in today. Pt verbalized understanding.

## 2014-04-02 NOTE — Addendum Note (Signed)
Addended by: Michae Kava on: 04/02/2014 09:53 AM   Modules accepted: Orders

## 2014-04-03 ENCOUNTER — Encounter: Payer: Self-pay | Admitting: Podiatry

## 2014-04-03 ENCOUNTER — Ambulatory Visit (INDEPENDENT_AMBULATORY_CARE_PROVIDER_SITE_OTHER): Payer: PRIVATE HEALTH INSURANCE

## 2014-04-03 ENCOUNTER — Ambulatory Visit (INDEPENDENT_AMBULATORY_CARE_PROVIDER_SITE_OTHER): Payer: PRIVATE HEALTH INSURANCE | Admitting: Podiatry

## 2014-04-03 VITALS — BP 196/88 | HR 73 | Temp 97.3°F | Resp 17

## 2014-04-03 DIAGNOSIS — M79673 Pain in unspecified foot: Secondary | ICD-10-CM

## 2014-04-03 DIAGNOSIS — M79609 Pain in unspecified limb: Secondary | ICD-10-CM

## 2014-04-03 DIAGNOSIS — M2010 Hallux valgus (acquired), unspecified foot: Secondary | ICD-10-CM

## 2014-04-03 DIAGNOSIS — L97509 Non-pressure chronic ulcer of other part of unspecified foot with unspecified severity: Secondary | ICD-10-CM

## 2014-04-03 DIAGNOSIS — B351 Tinea unguium: Secondary | ICD-10-CM

## 2014-04-03 NOTE — Progress Notes (Signed)
   Subjective:    Patient ID: Kathleen [name], female    DOB: 1946-02-11, 68 y.o.   MRN: [id number]  HPI  Pt presents for nail debridement, currently has an ulcer on top of her right foot, ulcer has been present for 3 weeks with noted drainage.   Review of Systems  Gastrointestinal: Positive for nausea, abdominal pain, constipation and abdominal distention.  Musculoskeletal: Positive for arthralgias and gait problem.  Skin: Positive for color change, rash and wound.  Neurological: Positive for dizziness.  Hematological: Bruises/bleeds easily.  All other systems reviewed and are negative.      Objective:   Physical Exam        Assessment & Plan:

## 2014-04-03 NOTE — Progress Notes (Signed)
Subjective:     Patient ID: Kathleen [name], female   DOB: 1945/08/13, 68 y.o.   MRN: [id number]  HPI patient presents with a spot on her right bunion it's become sore and has worn a opening on it over the last 3 or 4 weeks and thick nail disease 1-5 both feet that she cannot take care of. sHe is a diabetic under reasonable control   Review of Systems  All other systems reviewed and are negative.      Objective:   Physical Exam  Nursing note and vitals reviewed. Constitutional: She is oriented to person, place, and time.  Musculoskeletal: Normal range of motion.  Neurological: She is oriented to person, place, and time.  Skin: Skin is warm and dry.   I noted that the vascular status is diminished with DP PT is pulses palpable but diminished and neurological sharp dull and vibratory are diminished as is 2. tactile sensation. Patient does have structural hyperostosis medial aspect first metatarsal head right with a breakdown of tissue around the medial side measuring approximately 3 mm by3 mm with no proximal edema erythema or drainage noted. Patient's found to have nail disease 1-5 both feet that are thick yellow brittle and painful and is noted to have well-perfused digits with moderate depression of the arch upon weightbearing     Assessment:     Structural HAV with first metatarsal secondary to pressure in and at risk diabetic along with nail disease and thickness 1-5 both feetsmall ulceration right    PH&P and x-rays reviewed. Today using sharp sterile instrumentation debris did lesion and applied padding and packed with Iodosorb under occlusion and instructed on soaks. I debrided nailbeds 1-5 of both feet and this will be done on routine basis and I have strongly recommended diabetic shoes for this patient do to her at risk status and ulceration. If any proximal erythema edema drainage or any systemic signs of infection were to occur she is immediately to let us know or go to the  hospit[facility]an:

## 2014-04-28 ENCOUNTER — Other Ambulatory Visit: Payer: PRIVATE HEALTH INSURANCE

## 2014-04-29 ENCOUNTER — Encounter: Payer: Self-pay | Admitting: Internal Medicine

## 2014-04-29 ENCOUNTER — Ambulatory Visit (INDEPENDENT_AMBULATORY_CARE_PROVIDER_SITE_OTHER): Payer: PRIVATE HEALTH INSURANCE | Admitting: Internal Medicine

## 2014-04-29 VITALS — BP 166/79 | HR 85 | Temp 97.8°F | Wt 186.0 lb

## 2014-04-29 DIAGNOSIS — L03119 Cellulitis of unspecified part of limb: Secondary | ICD-10-CM

## 2014-04-29 DIAGNOSIS — L02419 Cutaneous abscess of limb, unspecified: Secondary | ICD-10-CM

## 2014-04-29 NOTE — Progress Notes (Signed)
Patient ID: Kathleen [name], female   DOB: 17-May-1946, 68 y.o.   MRN: [id number]         [facility]  Patient Active Problem List   Diagnosis Date Noted  . Cellulitis and abscess of right lower extremitiy 02/28/2014    Priority: High  . Unspecified essential hypertension   . Unspecified vitamin D deficiency   . Chronic combined systolic and diastolic heart failure 65/78/4696  . CAD (coronary artery disease)   . Obesity   . Meningioma   . Anemia   . CKD stage 3 due to type 2 diabetes mellitus 01/24/2014  . Hyperlipidemia 01/22/2014  . BPPV (benign paroxysmal positional vertigo) 01/22/2014  . Tobacco abuse 01/21/2014  . Type II or unspecified type diabetes mellitus with peripheral circulatory disorders, uncontrolled(250.72) 01/21/2014    Patient's Medications  New Prescriptions   No medications on file  Previous Medications   ASPIRIN 81 MG EC TABLET    Take 1 tablet (81 mg total) by mouth daily.   CARVEDILOL (COREG) 6.25 MG TABLET    Take 1 tablet (6.25 mg total) by mouth 2 (two) times daily with a meal.   CHOLECALCIFEROL (VITAMIN D) 1000 UNITS TABLET    Take 1,000 Units by mouth daily.   FUROSEMIDE (LASIX) 40 MG TABLET    Take 40 mg by mouth daily.   HYDRALAZINE (APRESOLINE) 25 MG TABLET    Take 1 tablet (25 mg total) by mouth 3 (three) times daily.   INSULIN DETEMIR (LEVEMIR) 100 UNIT/ML INJECTION    Inject 25 Units into the skin at bedtime.   ISOSORBIDE MONONITRATE (IMDUR) 30 MG 24 HR TABLET    Take 1 tablet (30 mg total) by mouth daily.   MECLIZINE (ANTIVERT) 12.5 MG TABLET    Take 1 tablet (12.5 mg total) by mouth 3 (three) times daily as needed for dizziness or nausea.   PIOGLITAZONE-METFORMIN (ACTOPLUS MET) 15-850 MG PER TABLET    Take 1 tablet by mouth 2 (two) times daily with a meal.   PROPYLENE GLYCOL (SYSTANE BALANCE OP)    Apply 1 drop to eye 4 (four) times daily as needed (dry eyes).   SIMVASTATIN (ZOCOR) 20 MG TABLET    Take 1 tablet (20  mg total) by mouth daily at 6 PM.   TRIAMTERENE-HYDROCHLOROTHIAZIDE (MAXZIDE-25) 37.5-25 MG PER TABLET    Take 1 tablet by mouth 3 (three) times daily.  Modified Medications   No medications on file  Discontinued Medications   No medications on file    Subjective: Kathleen [name] is in for her regular followup visit. She's not had any recurrent cellulitis of her right leg since her last visit. The area of swelling over her medial right metatarsal head that I noted at the time of her initial visit did eventually ulcerate. She saw Dr. Elease Hashimoto at [facility]. He debrided the ulcer and trimmed her toenails. She has been started on Lasix and has noted marked improvement in her chronic leg edema.  Review of Systems: Pertinent items are noted in HPI.  Past Medical History  Diagnosis Date  . Diabetes mellitus without complication   . CKD (chronic kidney disease), stage III     a. Borderline III-IV.  [name] Hypertension   . CAD (coronary artery disease)     a. Cath 01/2014: mild in LAD, mild-mod LCx, mod-severe RCA - for med rx.  . Systolic CHF     a. 09/9526: dx with mixed ICM/NICM (out of proportion  to CAD) EF 25-30% by echo.   . Hyperlipidemia   . Anemia     a. 01/2014: suspected of chronic disease, negative FOBT.  [name] Meningioma     a. Incidental dx 01/2014.  . Obesity     History  Substance Use Topics  . Smoking status: Current Every Day Smoker -- 0.25 packs/day for 30 years    Types: Cigarettes  . Smokeless tobacco: Never Used  . Alcohol Use: No    Family History  Problem Relation Age of Onset  . Diabetic kidney disease Mother   . Hypertension Mother   . Hypertension Father   . Heart failure Mother   . Heart attack Mother   . Hyperlipidemia Mother     No Known Allergies  Objective: Temp: 97.8 F (36.6 C) (09/22 1018) Temp src: Oral (09/22 1018) BP: 166/79 mmHg (09/22 1018) Pulse Rate: 85 (09/22 1018)  General: She is in good spirits  extremities: Her lower extremity  edema is improved. She has thin fragile skin on her lower legs and evidence of chronic venous stasis. She has a scabbed area on her right shin where she bumped her leg on a car door. She has mild diffuse erythema bilaterally but no evidence of cellulitis. She has a dry ulcer over the right metatarsal head without evidence of infection.    Assessment: She has no evidence of recurrent cellulitis. Encouraged her to continue to work on glycemic control and encouraged her to try the stop smoking cigarettes completely. She is scheduled to be fitted with diabetic shoes when she sees her primary care physician next month.  Plan: 1. Observe off of antibiotics 2. Followup here as needed   Michel Bickers, MD [facility] [phone] pager   [phone] cell 04/29/2014, 10:30 AM

## 2014-05-13 ENCOUNTER — Telehealth: Payer: Self-pay | Admitting: *Deleted

## 2014-05-13 NOTE — Telephone Encounter (Signed)
I am calling because I received a note from the [place].  You all have made multiple attempts to get authorization from my doctor for my Diabetic shoes and insoles.  I'm calling to inform you all that I will see my doctor on the 15th of October.  If you mail me the papers that need to be signed I can take them and have him to sign them on the 15th.  Call and let me know what you're going to do.  Thank you so much and have a blessed day.  I attempted to call patient at her home number, voicemail was not set up.  I called on mobile and she answered.  I told he we are mailing the forms tomorrow.  She said okay thank you.

## 2014-05-14 ENCOUNTER — Ambulatory Visit (HOSPITAL_COMMUNITY): Payer: PRIVATE HEALTH INSURANCE | Attending: Internal Medicine | Admitting: Radiology

## 2014-05-14 DIAGNOSIS — E119 Type 2 diabetes mellitus without complications: Secondary | ICD-10-CM | POA: Insufficient documentation

## 2014-05-14 DIAGNOSIS — E669 Obesity, unspecified: Secondary | ICD-10-CM | POA: Insufficient documentation

## 2014-05-14 DIAGNOSIS — E785 Hyperlipidemia, unspecified: Secondary | ICD-10-CM | POA: Insufficient documentation

## 2014-05-14 DIAGNOSIS — I5042 Chronic combined systolic (congestive) and diastolic (congestive) heart failure: Secondary | ICD-10-CM

## 2014-05-14 DIAGNOSIS — Z87891 Personal history of nicotine dependence: Secondary | ICD-10-CM | POA: Insufficient documentation

## 2014-05-14 NOTE — Progress Notes (Signed)
Echocardiogram performed.  

## 2014-05-15 ENCOUNTER — Telehealth: Payer: Self-pay | Admitting: *Deleted

## 2014-05-15 ENCOUNTER — Encounter: Payer: Self-pay | Admitting: Physician Assistant

## 2014-05-15 NOTE — Telephone Encounter (Signed)
pt notified about echo results and EF now normal

## 2014-05-20 ENCOUNTER — Ambulatory Visit (INDEPENDENT_AMBULATORY_CARE_PROVIDER_SITE_OTHER): Payer: PRIVATE HEALTH INSURANCE | Admitting: Cardiovascular Disease

## 2014-05-20 ENCOUNTER — Encounter: Payer: Self-pay | Admitting: Cardiovascular Disease

## 2014-05-20 VITALS — BP 134/70 | HR 70 | Ht 63.0 in | Wt 190.0 lb

## 2014-05-20 DIAGNOSIS — E139 Other specified diabetes mellitus without complications: Secondary | ICD-10-CM

## 2014-05-20 DIAGNOSIS — E1122 Type 2 diabetes mellitus with diabetic chronic kidney disease: Secondary | ICD-10-CM

## 2014-05-20 DIAGNOSIS — E785 Hyperlipidemia, unspecified: Secondary | ICD-10-CM

## 2014-05-20 DIAGNOSIS — I2583 Coronary atherosclerosis due to lipid rich plaque: Secondary | ICD-10-CM

## 2014-05-20 DIAGNOSIS — N183 Chronic kidney disease, stage 3 unspecified: Secondary | ICD-10-CM

## 2014-05-20 DIAGNOSIS — I2510 Atherosclerotic heart disease of native coronary artery without angina pectoris: Secondary | ICD-10-CM

## 2014-05-20 NOTE — Assessment & Plan Note (Signed)
Stable with no angina and good activity level.  Continue medical Rx  

## 2014-05-20 NOTE — Assessment & Plan Note (Signed)
Discussed low carb diet.  Target hemoglobin A1c is 6.5 or less.  Continue current medications.  

## 2014-05-20 NOTE — Patient Instructions (Signed)
Your physician wants you to follow-up in:   [place] will receive a reminder letter in the mail two months in advance. If you don't receive a letter, please call our office to schedule the follow-up appointment. Your physician recommends that you continue on your current medications as directed. Please refer to the Current Medication list given to you today.  Your physician has requested that you have a carotid duplex. This test is an ultrasound of the carotid arteries in your neck. It looks at blood flow through these arteries that supply the brain with blood. Allow one hour for this exam. There are no restrictions or special instructions. DUE IN  June OF  2016

## 2014-05-20 NOTE — Progress Notes (Signed)
Patient ID: MCKYNZI CAMMON, female   DOB: [birth year], 68 y.o.   MRN: [id number] Kathleen [name] is a 68 y.o. female with a hx of HTN, DM, HL, CKD (baseline Cr ~ 1.8), tobacco abuse. Admitted 6/16-6/25 with vertigo symptoms in setting of elevated BP. Brain MRI with incidental finding of a 3.5 x 2.8 cm meningioma. Echocardiogram demonstrated new cardiomyopathy with an EF of 25-30%. Myoview was abnormal with anterior and lateral ischemia as well as inferior scar with minimal peri-infarct ischemia. LHC demonstrated moderate to severe mid RCA stenosis and no significant CAD in the circumflex or LAD. Medical therapy was favored. ARB had been held to reduce the risk of contrast-induced nephropathy. I saw her in follow up 02/12/14. Follow up lab work demonstrated that creatinine remained slightly elevated. I decided to hold off on resuming her ARB. She returns for follow up.  She returns for follow up. She had a viral illness this weekend and had some nausea and diarrhea. She feels better now. Breathing is stable. She is NYHA 2-2b. No orthopnea, PND. No chest pain. No syncope. LE edema is stable. She has a wound on her R leg and is to go see the wound clinic soon.  Studies:  - LHC (01/27/14): Mid RCA 70% then 50%. Medical therapy favored.  - Echo (01/22/14): Moderate LVH, EF 25-30%, diffuse HK (no RWMA), grade 1 diastolic dysfunction, normal RV function,  - Nuclear (01/23/14): Inferior scar with minimal peri-infarct ischemia, anterior and lateral ischemia, EF 41%  - Carotid US (01/22/14): Bilateral ICA 1-39% by end diast velocities/plaque morphology/ICA-CCA ratio (95-28% by peak systolic velocity)  Recent Labs:  01/21/2014: HDL Cholesterol by NMR 53; LDL (calc) 173*; Pro B Natriuretic peptide (BNP) 4302.0*; TSH 0.661  01/28/2014: ALT 9; Hemoglobin 10.7*  03/03/2014: Creatinine 2.2*; Potassium 4.0   Echo 05/14/14  EF near normal  Study Conclusions  - Left ventricle: The cavity size was normal. Wall thickness  was increased in a pattern of moderate LVH. Systolic function was normal. The estimated ejection fraction was in the range of 50% to 55%. Regional wall motion abnormalities cannot be excluded. Doppler parameters are consistent with abnormal left ventricular relaxation (grade 1 diastolic dysfunction). The E/e&' ratio is >15, suggesting elevated LV filling pressure. - Mitral valve: Calcified annulus. Mildly thickened leaflets . There was mild regurgitation. - Left atrium: The atrium was normal in size. - Atrial septum: No defect or patent foramen ovale was identified. - Tricuspid valve: There was mild regurgitation. - Pulmonary arteries: PA peak pressure: 35 mm Hg (S).  Impressions:  - Compared to the prior echo in 01/2013, the EF has now near-normalized to 50-55%.    ROS: Denies fever, malais, weight loss, blurry vision, decreased visual acuity, cough, sputum, SOB, hemoptysis, pleuritic pain, palpitaitons, heartburn, abdominal pain, melena, lower extremity edema, claudication, or rash.  All other systems reviewed and negative  General: Affect appropriate Overweight black female  HEENT: normal Neck supple with no adenopathy JVP normal left  bruits no thyromegaly Lungs clear with no wheezing and good diaphragmatic motion Heart:  S1/S2 no murmur, no rub, gallop or click PMI normal Abdomen: benighn, BS positve, no tenderness, no AAA no bruit.  No HSM or HJR Distal pulses intact with no bruits No edema Neuro non-focal Skin warm and dry No muscular weakness   Current Outpatient Prescriptions  Medication Sig Dispense Refill  . aspirin 81 MG EC tablet Take 1 tablet (81 mg total) by mouth daily.  30 tablet  3  .  carvedilol (COREG) 6.25 MG tablet Take 1 tablet (6.25 mg total) by mouth 2 (two) times daily with a meal.  60 tablet  2  . cholecalciferol (VITAMIN D) 1000 UNITS tablet Take 1,000 Units by mouth daily.      . furosemide (LASIX) 40 MG tablet Take 40 mg by mouth daily.       . hydrALAZINE (APRESOLINE) 25 MG tablet Take 1 tablet (25 mg total) by mouth 3 (three) times daily.  90 tablet  3  . insulin detemir (LEVEMIR) 100 UNIT/ML injection Inject 25 Units into the skin at bedtime.      . isosorbide mononitrate (IMDUR) 30 MG 24 hr tablet Take 1 tablet (30 mg total) by mouth daily.  90 tablet  1  . meclizine (ANTIVERT) 12.5 MG tablet Take 1 tablet (12.5 mg total) by mouth 3 (three) times daily as needed for dizziness or nausea.  30 tablet  0  . pioglitazone-metformin (ACTOPLUS MET) 15-850 MG per tablet Take 1 tablet by mouth 2 (two) times daily with a meal.      . Propylene Glycol (SYSTANE BALANCE OP) Apply 1 drop to eye 4 (four) times daily as needed (dry eyes).      . simvastatin (ZOCOR) 20 MG tablet Take 1 tablet (20 mg total) by mouth daily at 6 PM.  90 tablet  1  . triamterene-hydrochlorothiazide (MAXZIDE-25) 37.5-25 MG per tablet Take 1 tablet by mouth 3 (three) times daily.       No current facility-administered medications for this visit.    Allergies  Review of patient's allergies indicates no known allergies.  Electrocardiogram:  SR rate 69 QT 456  Nonspecific ST/ [place] changes   Assessment and Plan

## 2014-05-20 NOTE — Assessment & Plan Note (Signed)
Cr stable f/u with nephrology currently not on ACE since EF normalized

## 2014-05-20 NOTE — Assessment & Plan Note (Signed)
Cholesterol is at goal.  Continue current dose of statin and diet Rx.  No myalgias or side effects.  F/U  LFT's in 6 months. Lab Results  Component Value Date   LDLCALC 173* 01/21/2014

## 2014-05-23 ENCOUNTER — Other Ambulatory Visit: Payer: Self-pay

## 2014-06-10 ENCOUNTER — Encounter (HOSPITAL_COMMUNITY): Payer: Self-pay | Admitting: Emergency Medicine

## 2014-06-10 ENCOUNTER — Observation Stay (HOSPITAL_COMMUNITY)
Admission: EM | Admit: 2014-06-10 | Discharge: 2014-06-13 | Disposition: A | Discharge status: Home / Self Care | Payer: PRIVATE HEALTH INSURANCE | Attending: Internal Medicine | Admitting: Internal Medicine

## 2014-06-10 ENCOUNTER — Emergency Department (HOSPITAL_COMMUNITY): Payer: PRIVATE HEALTH INSURANCE

## 2014-06-10 DIAGNOSIS — Z79899 Other long term (current) drug therapy: Secondary | ICD-10-CM | POA: Diagnosis not present

## 2014-06-10 DIAGNOSIS — E669 Obesity, unspecified: Secondary | ICD-10-CM | POA: Insufficient documentation

## 2014-06-10 DIAGNOSIS — R55 Syncope and collapse: Secondary | ICD-10-CM | POA: Diagnosis not present

## 2014-06-10 DIAGNOSIS — E1169 Type 2 diabetes mellitus with other specified complication: Secondary | ICD-10-CM | POA: Diagnosis present

## 2014-06-10 DIAGNOSIS — I2510 Atherosclerotic heart disease of native coronary artery without angina pectoris: Secondary | ICD-10-CM | POA: Diagnosis not present

## 2014-06-10 DIAGNOSIS — Z7982 Long term (current) use of aspirin: Secondary | ICD-10-CM | POA: Diagnosis not present

## 2014-06-10 DIAGNOSIS — Z85841 Personal history of malignant neoplasm of brain: Secondary | ICD-10-CM | POA: Insufficient documentation

## 2014-06-10 DIAGNOSIS — S29001A Unspecified injury of muscle and tendon of front wall of thorax, initial encounter: Secondary | ICD-10-CM | POA: Diagnosis not present

## 2014-06-10 DIAGNOSIS — I129 Hypertensive chronic kidney disease with stage 1 through stage 4 chronic kidney disease, or unspecified chronic kidney disease: Secondary | ICD-10-CM | POA: Insufficient documentation

## 2014-06-10 DIAGNOSIS — W19XXXA Unspecified fall, initial encounter: Secondary | ICD-10-CM

## 2014-06-10 DIAGNOSIS — I5042 Chronic combined systolic (congestive) and diastolic (congestive) heart failure: Secondary | ICD-10-CM | POA: Diagnosis present

## 2014-06-10 DIAGNOSIS — E785 Hyperlipidemia, unspecified: Secondary | ICD-10-CM | POA: Diagnosis present

## 2014-06-10 DIAGNOSIS — D649 Anemia, unspecified: Secondary | ICD-10-CM | POA: Insufficient documentation

## 2014-06-10 DIAGNOSIS — E119 Type 2 diabetes mellitus without complications: Secondary | ICD-10-CM | POA: Diagnosis not present

## 2014-06-10 DIAGNOSIS — E139 Other specified diabetes mellitus without complications: Secondary | ICD-10-CM

## 2014-06-10 DIAGNOSIS — Z794 Long term (current) use of insulin: Secondary | ICD-10-CM | POA: Diagnosis not present

## 2014-06-10 DIAGNOSIS — S8992XA Unspecified injury of left lower leg, initial encounter: Secondary | ICD-10-CM | POA: Diagnosis not present

## 2014-06-10 DIAGNOSIS — S99911A Unspecified injury of right ankle, initial encounter: Secondary | ICD-10-CM | POA: Diagnosis not present

## 2014-06-10 DIAGNOSIS — N186 End stage renal disease: Secondary | ICD-10-CM | POA: Diagnosis present

## 2014-06-10 DIAGNOSIS — Y9389 Activity, other specified: Secondary | ICD-10-CM | POA: Diagnosis not present

## 2014-06-10 DIAGNOSIS — Y9289 Other specified places as the place of occurrence of the external cause: Secondary | ICD-10-CM | POA: Insufficient documentation

## 2014-06-10 DIAGNOSIS — I5020 Unspecified systolic (congestive) heart failure: Secondary | ICD-10-CM | POA: Insufficient documentation

## 2014-06-10 DIAGNOSIS — N183 Chronic kidney disease, stage 3 (moderate): Secondary | ICD-10-CM

## 2014-06-10 DIAGNOSIS — Z720 Tobacco use: Secondary | ICD-10-CM | POA: Diagnosis not present

## 2014-06-10 DIAGNOSIS — R42 Dizziness and giddiness: Secondary | ICD-10-CM

## 2014-06-10 DIAGNOSIS — W1830XA Fall on same level, unspecified, initial encounter: Secondary | ICD-10-CM | POA: Insufficient documentation

## 2014-06-10 DIAGNOSIS — E1122 Type 2 diabetes mellitus with diabetic chronic kidney disease: Secondary | ICD-10-CM

## 2014-06-10 DIAGNOSIS — R4020 Unspecified coma: Secondary | ICD-10-CM | POA: Diagnosis present

## 2014-06-10 DIAGNOSIS — Z992 Dependence on renal dialysis: Secondary | ICD-10-CM

## 2014-06-10 HISTORY — DX: Syncope and collapse: R55

## 2014-06-10 LAB — BASIC METABOLIC PANEL
Anion gap: 15 (ref 5–15)
BUN: 48 mg/dL — AB (ref 6–23)
CO2: 23 meq/L (ref 19–32)
CREATININE: 2.09 mg/dL — AB (ref 0.50–1.10)
Calcium: 8.3 mg/dL — ABNORMAL LOW (ref 8.4–10.5)
Chloride: 106 mEq/L (ref 96–112)
GFR calc non Af Amer: 23 mL/min — ABNORMAL LOW (ref >90)
GFR, EST AFRICAN AMERICAN: 27 mL/min — AB (ref >90)
Glucose, Bld: 112 mg/dL — ABNORMAL HIGH (ref 70–99)
Potassium: 5.1 mEq/L (ref 3.7–5.3)
Sodium: 144 mEq/L (ref 137–147)

## 2014-06-10 LAB — CBC
HCT: 32.1 % — ABNORMAL LOW (ref 36.0–46.0)
HEMATOCRIT: 30.8 % — AB (ref 36.0–46.0)
HEMOGLOBIN: 9.4 g/dL — AB (ref 12.0–15.0)
Hemoglobin: 9.8 g/dL — ABNORMAL LOW (ref 12.0–15.0)
MCH: 26.8 pg (ref 26.0–34.0)
MCH: 26.9 pg (ref 26.0–34.0)
MCHC: 30.5 g/dL (ref 30.0–36.0)
MCHC: 30.5 g/dL (ref 30.0–36.0)
MCV: 87.9 fL (ref 78.0–100.0)
MCV: 88 fL (ref 78.0–100.0)
Platelets: 217 10*3/uL (ref 150–400)
Platelets: 220 10*3/uL (ref 150–400)
RBC: 3.65 MIL/uL — ABNORMAL LOW (ref 3.87–5.11)
RBC: 3.5 MIL/uL — ABNORMAL LOW (ref 3.87–5.11)
RDW: 17.1 % — AB (ref 11.5–15.5)
RDW: 17.1 % — ABNORMAL HIGH (ref 11.5–15.5)
WBC: 4.7 10*3/uL (ref 4.0–10.5)
WBC: 4.7 10*3/uL (ref 4.0–10.5)

## 2014-06-10 LAB — CBG MONITORING, ED: Glucose-Capillary: 124 mg/dL — ABNORMAL HIGH (ref 70–99)

## 2014-06-10 LAB — CREATININE, SERUM
Creatinine, Ser: 2.16 mg/dL — ABNORMAL HIGH (ref 0.50–1.10)
GFR calc Af Amer: 26 mL/min — ABNORMAL LOW (ref >90)
GFR, EST NON AFRICAN AMERICAN: 22 mL/min — AB (ref >90)

## 2014-06-10 LAB — TSH: TSH: 0.801 u[IU]/mL (ref 0.350–4.500)

## 2014-06-10 LAB — I-STAT TROPONIN, ED: Troponin i, poc: 0.04 ng/mL (ref 0.00–0.08)

## 2014-06-10 LAB — PROTIME-INR
INR: 1.25 (ref 0.00–1.49)
PROTHROMBIN TIME: 15.8 s — AB (ref 11.6–15.2)

## 2014-06-10 LAB — GLUCOSE, CAPILLARY: GLUCOSE-CAPILLARY: 146 mg/dL — AB (ref 70–99)

## 2014-06-10 LAB — POC OCCULT BLOOD, ED: FECAL OCCULT BLD: NEGATIVE

## 2014-06-10 LAB — TROPONIN I: Troponin I: <0.3 ng/mL (ref <0.30)

## 2014-06-10 MED ORDER — INSULIN ASPART 100 UNIT/ML ~~LOC~~ SOLN: 0.0000 [IU] | Freq: Every day | SUBCUTANEOUS | Status: DC

## 2014-06-10 MED ORDER — SODIUM CHLORIDE 0.9 % IJ SOLN
3.0000 mL | Freq: Two times a day (BID) | INTRAMUSCULAR | Status: DC
  Administered 2014-06-11: 3 mL via INTRAVENOUS

## 2014-06-10 MED ORDER — HYDRALAZINE HCL 25 MG PO TABS
25.0000 mg | ORAL_TABLET | Freq: Three times a day (TID) | ORAL | Status: DC
  Administered 2014-06-10 – 2014-06-13 (×8): 25 mg via ORAL
  Filled 2014-06-10 (×9): qty 1

## 2014-06-10 MED ORDER — ONDANSETRON HCL 4 MG/2ML IJ SOLN: 4.0000 mg | Freq: Four times a day (QID) | INTRAMUSCULAR | Status: DC | PRN

## 2014-06-10 MED ORDER — ALUM & MAG HYDROXIDE-SIMETH 200-200-20 MG/5ML PO SUSP: 30.0000 mL | Freq: Four times a day (QID) | ORAL | Status: DC | PRN

## 2014-06-10 MED ORDER — SODIUM CHLORIDE 0.9 % IJ SOLN
3.0000 mL | Freq: Two times a day (BID) | INTRAMUSCULAR | Status: DC
  Administered 2014-06-10 – 2014-06-12 (×3): 3 mL via INTRAVENOUS

## 2014-06-10 MED ORDER — SODIUM CHLORIDE 0.9 % IV SOLN
INTRAVENOUS | Status: DC
  Administered 2014-06-10: 19:00:00 via INTRAVENOUS

## 2014-06-10 MED ORDER — ACETAMINOPHEN 325 MG PO TABS: 650.0000 mg | ORAL_TABLET | Freq: Four times a day (QID) | ORAL | Status: DC | PRN

## 2014-06-10 MED ORDER — CARVEDILOL 12.5 MG PO TABS
12.5000 mg | ORAL_TABLET | Freq: Two times a day (BID) | ORAL | Status: DC
  Administered 2014-06-11 – 2014-06-13 (×5): 12.5 mg via ORAL
  Filled 2014-06-10 (×5): qty 1

## 2014-06-10 MED ORDER — ASPIRIN EC 81 MG PO TBEC
81.0000 mg | DELAYED_RELEASE_TABLET | Freq: Every day | ORAL | Status: DC
  Administered 2014-06-11 – 2014-06-13 (×3): 81 mg via ORAL
  Filled 2014-06-10 (×3): qty 1

## 2014-06-10 MED ORDER — ISOSORBIDE MONONITRATE ER 30 MG PO TB24
30.0000 mg | ORAL_TABLET | Freq: Every day | ORAL | Status: DC
  Administered 2014-06-11 – 2014-06-13 (×3): 30 mg via ORAL
  Filled 2014-06-10 (×3): qty 1

## 2014-06-10 MED ORDER — SODIUM CHLORIDE 0.9 % IV SOLN: 250.0000 mL | INTRAVENOUS | Status: DC | PRN

## 2014-06-10 MED ORDER — ONDANSETRON HCL 4 MG PO TABS: 4.0000 mg | ORAL_TABLET | Freq: Four times a day (QID) | ORAL | Status: DC | PRN

## 2014-06-10 MED ORDER — DOCUSATE SODIUM 100 MG PO CAPS
100.0000 mg | ORAL_CAPSULE | Freq: Two times a day (BID) | ORAL | Status: DC
  Administered 2014-06-10 – 2014-06-13 (×6): 100 mg via ORAL
  Filled 2014-06-10 (×6): qty 1

## 2014-06-10 MED ORDER — OXYCODONE HCL 5 MG PO TABS: 5.0000 mg | ORAL_TABLET | ORAL | Status: DC | PRN

## 2014-06-10 MED ORDER — INSULIN DETEMIR 100 UNIT/ML ~~LOC~~ SOLN
12.0000 [IU] | Freq: Every day | SUBCUTANEOUS | Status: DC
  Administered 2014-06-10 – 2014-06-12 (×3): 12 [IU] via SUBCUTANEOUS
  Filled 2014-06-10 (×4): qty 0.12

## 2014-06-10 MED ORDER — SIMVASTATIN 20 MG PO TABS
20.0000 mg | ORAL_TABLET | Freq: Every day | ORAL | Status: DC
  Administered 2014-06-11 – 2014-06-12 (×2): 20 mg via ORAL
  Filled 2014-06-10 (×3): qty 1

## 2014-06-10 MED ORDER — ENOXAPARIN SODIUM 30 MG/0.3ML ~~LOC~~ SOLN
30.0000 mg | SUBCUTANEOUS | Status: DC
  Administered 2014-06-10 – 2014-06-12 (×3): 30 mg via SUBCUTANEOUS
  Filled 2014-06-10 (×4): qty 0.3

## 2014-06-10 MED ORDER — INSULIN ASPART 100 UNIT/ML ~~LOC~~ SOLN
0.0000 [IU] | Freq: Three times a day (TID) | SUBCUTANEOUS | Status: DC
  Administered 2014-06-12: 3 [IU] via SUBCUTANEOUS

## 2014-06-10 MED ORDER — ASPIRIN 81 MG PO TBEC
81.0000 mg | DELAYED_RELEASE_TABLET | Freq: Every day | ORAL | Status: DC
Start: 2014-06-10 — End: 2014-06-10

## 2014-06-10 MED ORDER — ACETAMINOPHEN 650 MG RE SUPP: 650.0000 mg | Freq: Four times a day (QID) | RECTAL | Status: DC | PRN

## 2014-06-10 MED ORDER — SODIUM CHLORIDE 0.9 % IJ SOLN
3.0000 mL | INTRAMUSCULAR | Status: DC | PRN
  Administered 2014-06-11: 3 mL via INTRAVENOUS
  Filled 2014-06-10: qty 3

## 2014-06-10 NOTE — ED Notes (Signed)
Phlebotomy at bedside.

## 2014-06-10 NOTE — H&P (Signed)
[facility] History and Physical  Kathleen [name] SEG:[id number] DOB: 08-12-1945 DOA: 06/10/2014  Referring physician:  PCP: Benito Mccreedy, MD   Chief Complaint: Syncope  HPI: Kathleen [name] is a 68 y.o. female with a past medical history of chronic diastolic congestive heart failure having her last transthoracic echocardiogram on 05/14/2014, showing grade 1 diastolic dysfunction with EF of 50-55%, type 2 diabetes mellitus, hypertension, had a syncopal episode on 06/09/2014. Patient reporting pain in her usual state health, went to the bathroom at approximate 9 AM, set on the commode to urinate, became diaphoretic and dizzy and passed out on the bathroom floor. This was unwitnessed. She was on the floor for approximately 6 hours, unable to stand due to feeling weak and pain in her ankle. Family knocking on the door she reported crawling over to the for the door to unlock it. She denies chest pain or shortness of breath, though complains of generalized weakness. Family reporting patient having a decline in the past 24 hours. Labs in the emergency room included troponin which was negative. Imaging of ankle and knee did not reveal evidence of fracture or dislocation. Head CT showing no acute intracranial abnormalities. She denies chest pain, shortness of breath, nausea, vomiting, fevers, chills, dysuria, hematuria.                                                                                                                                                                                                                                    Review of Systems:  Constitutional:  No weight loss, night sweats, Fevers, chills, positive for generalized weakness and fatigue.  HEENT:  No headaches, Difficulty swallowing,Tooth/dental problems,Sore throat,  No sneezing, itching, ear ache, nasal congestion, post nasal drip,  Cardio-vascular:  No chest pain, Orthopnea, PND, swelling in lower  extremities, anasarca, dizziness, palpitations  GI:  No heartburn, indigestion, abdominal pain, nausea, vomiting, diarrhea, change in bowel habits, loss of appetite  Resp:  No shortness of breath with exertion or at rest. No excess mucus, no productive cough, No non-productive cough, No coughing up of blood.No change in color of mucus.No wheezing.No chest wall deformity  Skin:  no rash or lesions.  GU:  no dysuria, change in color of urine, no urgency or frequency. No flank pain.  Musculoskeletal:  Positive for right ankle pain and left knee pain Psych:  No change in mood or affect. No depression or anxiety. No memory  loss.   Past Medical History  Diagnosis Date  . Diabetes mellitus without complication   . CKD (chronic kidney disease), stage III     a. Borderline III-IV.  [name] Hypertension   . CAD (coronary artery disease)     a. Cath 01/2014: mild in LAD, mild-mod LCx, mod-severe RCA - for med rx.  . Systolic CHF     a. 04/4075: dx with mixed ICM/NICM (out of proportion to CAD) EF 25-30% by echo.   . Hyperlipidemia   . Anemia     a. 01/2014: suspected of chronic disease, negative FOBT.  [name] Meningioma     a. Incidental dx 01/2014.  . Obesity   . History of echocardiogram     Echo (10/15):  Mod LVH, EF 50-55%, Gr 1 DD, MAC, mild MR, mild TR, PASP 35 mmHg   Past Surgical History  Procedure Laterality Date  . Abdominal hysterectomy    . Breast surgery    . Tonsillectomy     Social History:  reports that she has been smoking Cigarettes.  She has a 7.5 pack-year smoking history. She has never used smokeless tobacco. She reports that she does not drink alcohol or use illicit drugs.  No Known Allergies  Family History  Problem Relation Age of Onset  . Diabetic kidney disease Mother   . Hypertension Mother   . Hypertension Father   . Heart failure Mother   . Heart attack Mother   . Hyperlipidemia Mother      Prior to Admission medications   Medication Sig Start Date End Date  Taking? Authorizing Provider  aspirin 81 MG EC tablet Take 1 tablet (81 mg total) by mouth daily. 01/30/14  Yes Verlee Monte, MD  carvedilol (COREG) 6.25 MG tablet Take 12.5 mg by mouth 2 (two) times daily with a meal. 01/30/14  Yes Verlee Monte, MD  cholecalciferol (VITAMIN D) 1000 UNITS tablet Take 1,000 Units by mouth daily.   Yes Historical Provider, MD  furosemide (LASIX) 40 MG tablet Take 40 mg by mouth daily.   Yes Historical Provider, MD  hydrALAZINE (APRESOLINE) 25 MG tablet Take 1 tablet (25 mg total) by mouth 3 (three) times daily. 04/02/14  Yes Liliane Shi, PA-C  insulin detemir (LEVEMIR) 100 UNIT/ML injection Inject 25 Units into the skin at bedtime.   Yes Historical Provider, MD  isosorbide mononitrate (IMDUR) 30 MG 24 hr tablet Take 1 tablet (30 mg total) by mouth daily. 03/17/14  Yes Scott Joylene Draft, PA-C  meclizine (ANTIVERT) 12.5 MG tablet Take 1 tablet (12.5 mg total) by mouth 3 (three) times daily as needed for dizziness or nausea. 01/30/14  Yes Verlee Monte, MD  pioglitazone-metformin (ACTOPLUS MET) 15-850 MG per tablet Take 1 tablet by mouth 2 (two) times daily with a meal.   Yes Historical Provider, MD  Propylene Glycol (SYSTANE BALANCE OP) Apply 1 drop to eye 4 (four) times daily as needed (dry eyes).   Yes Historical Provider, MD  simvastatin (ZOCOR) 20 MG tablet Take 1 tablet (20 mg total) by mouth daily at 6 PM. 03/17/14  Yes Liliane Shi, PA-C   Physical Exam: Filed Vitals:   06/10/14 1400 06/10/14 1453 06/10/14 1511 06/10/14 1647  BP:    132/53  Pulse: 74 71 67 80  Temp:   97.7 F (36.5 C)   TempSrc:   Rectal   Resp: _0 Height:      Weight:      SpO2: 96% 99% 94%  96%    Wt Readings from Last 3 Encounters:  06/10/14 83.915 kg (185 lb)  05/20/14 86.183 kg (190 lb)  04/29/14 84.369 kg (186 lb)    General:  Appears calm, in no acute distress. She is following commands, denies chest pain or shortness of breath. Eyes: PERRL, normal lids, irises &  conjunctiva ENT: grossly normal hearing, lips & tongue Neck: no LAD, masses or thyromegaly Cardiovascular: RRR, no m/r/g. No LE edema. Telemetry: SR, no arrhythmias  Respiratory: CTA bilaterally, no w/r/r. Normal respiratory effort. Abdomen: soft, ntnd Skin: no rash or induration seen on limited exam Musculoskeletal: she has passive and active pain to movement of right foot and left leg at her knee, did not appreciate anatomical deformities Psychiatric: grossly normal mood and affect, speech fluent and appropriate Neurologic: grossly non-focal.          Labs on Admission:  Basic Metabolic Panel:  Recent Labs Lab 06/10/14 1454  NA 144  K 5.1  CL 106  CO2 23  GLUCOSE 112*  BUN 48*  CREATININE 2.09*  CALCIUM 8.3*   Liver Function Tests: No results for input(s): AST, ALT, ALKPHOS, BILITOT, PROT, ALBUMIN in the last 168 hours. No results for input(s): LIPASE, AMYLASE in the last 168 hours. No results for input(s): AMMONIA in the last 168 hours. CBC:  Recent Labs Lab 06/10/14 1454  WBC 4.7  HGB 9.8*  HCT 32.1*  MCV 87.9  PLT 217   Cardiac Enzymes:  Recent Labs Lab 06/10/14 1454  TROPONINI <0.30    BNP (last 3 results)  Recent Labs  01/21/14 1020 01/21/14 1732  PROBNP 3351.0* 4302.0*   CBG:  Recent Labs Lab 06/10/14 1336  GLUCAP 124*    Radiological Exams on Admission: Dg Ribs Unilateral W/chest Left  06/10/2014   CLINICAL DATA:  Fall from the same level. Syncopal episode well sitting on the yesterday. The patient fell to the floor. The patient had a second syncopal episode in the bathroom today after standing up. The patient was caught by for son before following completely. No head injury. Pain in the left upper posterior ribs. No loss of consciousness.  EXAM: LEFT RIBS AND CHEST - 3+ VIEW  COMPARISON:  Two-view chest x-ray 09/01/2008  FINDINGS: The heart is mildly enlarged. Mild pulmonary vascular congestion is evident. No focal airspace disease is  present.  Dedicated imaging of the left ribs demonstrate no acute or healing fractures.  IMPRESSION: 1. No acute or healing fractures. 2. Mild cardiomegaly without failure.   Electronically Signed   By: Lawrence Santiago M.D.   On: 06/10/2014 14:42   Dg Ankle Complete Right  06/10/2014   CLINICAL DATA:  Status post fall now with right ankle pain and inability to bear weight.  EXAM: RIGHT ANKLE - COMPLETE 3+ VIEW  COMPARISON:  Right foot series of April 03, 2014  FINDINGS: The bones are mildly osteopenic. The joint mortise is preserved but narrowed. There is no acute malleolar fracture. The talus and calcaneus are intact. There is moderate to severe degenerative change of the tarsometatarsal joints. There is soft tissue swelling especially laterally.  IMPRESSION: There is no acute fracture of the right ankle. There are significant degenerative changes of the tarsometatarsal joints.   Electronically Signed   By: David  Martinique   On: 06/10/2014 14:52   Ct Head Wo Contrast  06/10/2014   CLINICAL DATA:  Syncope.  EXAM: CT HEAD WITHOUT CONTRAST  TECHNIQUE: Contiguous axial images were obtained from the base of the  skull through the vertex without intravenous contrast.  COMPARISON:  01/21/2014  FINDINGS: There is no acute intracranial hemorrhage, infarction, or mass lesion. Brain parenchyma is normal. Osseous structures are normal.  IMPRESSION: Normal exam.   Electronically Signed   By: Rozetta Nunnery M.D.   On: 06/10/2014 14:50   Dg Knee Complete 4 Views Left  06/10/2014   CLINICAL DATA:  Left knee pain after fall.  EXAM: LEFT KNEE - COMPLETE 4+ VIEW  COMPARISON:  None.  FINDINGS: There is no evidence of fracture, dislocation, or joint effusion. Severe narrowing of lateral joint space is noted. Osteophyte formation is noted. Mild osteophyte formation is noted medially. Soft tissues are unremarkable.  IMPRESSION: Severe degenerative joint disease is noted laterally. No acute abnormality seen in the left knee.    Electronically Signed   By: Sabino Dick M.D.   On: 06/10/2014 14:41    EKG: Independently reviewed.EKG did not reveal ischemic changes   Assessment/Plan Principal Problem:   Syncope and collapse Active Problems:   Hyperlipidemia   CKD stage 3 due to type 2 diabetes mellitus   CAD (coronary artery disease)   Chronic combined systolic and diastolic heart failure   Syncope   1. Syncope. Patient reported being in her usual state health until yesterday morning, having a syncopal event while sitting on the commode urinating. Possibilities include micturitional syncope, vasovagal syncope, orthostatic hypotension, arrhythmias, and less likely seizure activity. CT scan of brain did not show acute intracranial abnormality. Will admit patient to telemetry, cycle troponins 3 sets, obtain a urinalysis, TSH, orthostatic vital signs, EEG, physical therapy consultation. Given the possibility of patient being orthostatic, will hold Lasix therapy, reassess volume status in a.m. Her last transthoracic echocardiogram performed on 05/14/2014 which showed grade 1 diastolic dysfunction with 50-55% ejection fraction. 2. Insulin-dependent type 2 diabetes mellitus. Patient having a blood sugar of 112 in the emergency department. Family reporting that she hasn't eaten very much today. Will decrease her Lantus dose from 25 to 12 units subcutaneous daily, hold oral hypoglycemic agents for now. Perform Accu-Cheks UAC daily at bedtime with sliding scale coverage 3. Dyslipidemia. Will check a fasting lipid panel for risk stratification, meanwhile continue statin therapy with Zocor 20 mg by mouth daily at bedtime 4. Hypertension. Continue hydralazine 25 mmol by mouth 3 times a day, Imdur 30 mg by mouth daily and Coreg 625 g by mouth twice a day. Blood pressures are stable in the emergency room 5. Stage III chronic kidney disease. Stable, lab work showing a creatinine of 2.09 which is her baseline 6. DVT prophylaxis.  Lovenox   Code Status: Full code Family Communication: Spoke with her son at bedside Disposition Plan: Will place in 24 hour observation, may not require greater than 2 nights hospitalizaiton  Time spent: 50 min  Kelvin Cellar [facility] Pager [phone]

## 2014-06-10 NOTE — ED Notes (Signed)
Patient transported to X-ray 

## 2014-06-10 NOTE — ED Notes (Signed)
Per EMS: patient has diabetes, htn, and vertigo, has not taken meds since Sunday  Had syncopal episode in BR yesterday, sitting on toilet, passed out and came to on the floor, patient stayed on floor for 6 hjours, c/o r knee pain, left ankle, left flank pain.  Was fine after that until today when she was in BR again and stood up, had syncopal episode again after feeling diaphoretic, was caught by son before falling to floor and hitting head.  EMS states family has had difficulty controlling blood sugars, cbg was 127 at scene.  patient is alert and oriented, VSS, skin warm and dry.

## 2014-06-10 NOTE — ED Notes (Signed)
Placed on 1 L while sleeping oxygen sat dropped 89%

## 2014-06-10 NOTE — ED Notes (Signed)
Patient transported to CT 

## 2014-06-10 NOTE — ED Notes (Signed)
Patient CBG was 124 PA and Nurse was informed.

## 2014-06-10 NOTE — ED Provider Notes (Signed)
CSN: [id number]     Arrival date & time 06/10/14  1312 History   First MD Initiated Contact with Patient 06/10/14 1320     Chief Complaint  Patient presents with  . Loss of Consciousness     (Consider location/radiation/quality/duration/timing/severity/associated sxs/prior Treatment) HPI  PCP- Dr. Noemi Chapel Kathleen [name] is a 68 y.o. female with a hx of HTN, DM, HL, CKD (baseline Cr ~ 1.8), tobacco abuse. Recently she was admitted 6/16-6/25 with vertigo symptoms in setting of elevated BP. She has PMH of meningioma.  Patient lives with her family. She reports that last night she went to use the restroom to urinate and become diaphoretic then next thing she remembers is that she was on the floor and some time had passed. She was on the ground for 6 hours.  she reports coming in and out of consciousness but not being able to get herself up because of decreased strength. Her daughter eventually came over to check on her when she wouldn't answer her phone. The patient reports crawling over to the door to unlock it  to allow her daughter to come in. At that time she did not seek medical help. She reports feeling weaker and using a walker which she does not normally need to use today formobility. Today she went to go to the bathroom again when  she became weak and had another syncopal episode. Her son happened to walk by at the perfect time and caught her before she fell on the ground. He then called EMS. She reports not feeling anything except for being diaphoretic. He is complaining of pain to her left ribs, left knee, right ankle. Denies focal weakness but says she is globally weak. She currently denies any other symptoms. VSS, awake, alert and oriented x 3.  Per note by Dr. Johnsie Cancel on 05/20/2014  Studies:  - LHC (01/27/14): Mid RCA 70% then 50%. Medical therapy favored.  - Echo (01/22/14): Moderate LVH, EF 25-30%, diffuse HK (no RWMA), grade 1 diastolic dysfunction, normal RV function,  -  Nuclear (01/23/14): Inferior scar with minimal peri-infarct ischemia, anterior and lateral ischemia, EF 41%  - Carotid US (01/22/14): Bilateral ICA 1-39% by end diast velocities/plaque morphology/ICA-CCA ratio (23-76% by peak systolic velocity)     Past Medical History  Diagnosis Date  . Diabetes mellitus without complication   . CKD (chronic kidney disease), stage III     a. Borderline III-IV.  [name] Hypertension   . CAD (coronary artery disease)     a. Cath 01/2014: mild in LAD, mild-mod LCx, mod-severe RCA - for med rx.  . Systolic CHF     a. 09/8313: dx with mixed ICM/NICM (out of proportion to CAD) EF 25-30% by echo.   . Hyperlipidemia   . Anemia     a. 01/2014: suspected of chronic disease, negative FOBT.  [name] Meningioma     a. Incidental dx 01/2014.  . Obesity   . History of echocardiogram     Echo (10/15):  Mod LVH, EF 50-55%, Gr 1 DD, MAC, mild MR, mild TR, PASP 35 mmHg   Past Surgical History  Procedure Laterality Date  . Abdominal hysterectomy    . Breast surgery    . Tonsillectomy     Family History  Problem Relation Age of Onset  . Diabetic kidney disease Mother   . Hypertension Mother   . Hypertension Father   . Heart failure Mother   . Heart attack Mother   . Hyperlipidemia Mother  History  Substance Use Topics  . Smoking status: Current Every Day Smoker -- 0.25 packs/day for 30 years    Types: Cigarettes  . Smokeless tobacco: Never Used  . Alcohol Use: No   OB History    No data available     Review of Systems 10 Systems reviewed and are negative for acute change except as noted in the HPI.      Allergies  Review of patient's allergies indicates no known allergies.  Home Medications   Prior to Admission medications   Medication Sig Start Date End Date Taking? Authorizing Provider  aspirin 81 MG EC tablet Take 1 tablet (81 mg total) by mouth daily. 01/30/14  Yes Verlee Monte, MD  carvedilol (COREG) 6.25 MG tablet Take 12.5 mg by mouth 2 (two)  times daily with a meal. 01/30/14  Yes Verlee Monte, MD  cholecalciferol (VITAMIN D) 1000 UNITS tablet Take 1,000 Units by mouth daily.   Yes Historical Provider, MD  furosemide (LASIX) 40 MG tablet Take 40 mg by mouth daily.   Yes Historical Provider, MD  hydrALAZINE (APRESOLINE) 25 MG tablet Take 1 tablet (25 mg total) by mouth 3 (three) times daily. 04/02/14  Yes Liliane Shi, PA-C  insulin detemir (LEVEMIR) 100 UNIT/ML injection Inject 25 Units into the skin at bedtime.   Yes Historical Provider, MD  isosorbide mononitrate (IMDUR) 30 MG 24 hr tablet Take 1 tablet (30 mg total) by mouth daily. 03/17/14  Yes Scott Joylene Draft, PA-C  meclizine (ANTIVERT) 12.5 MG tablet Take 1 tablet (12.5 mg total) by mouth 3 (three) times daily as needed for dizziness or nausea. 01/30/14  Yes Verlee Monte, MD  pioglitazone-metformin (ACTOPLUS MET) 15-850 MG per tablet Take 1 tablet by mouth 2 (two) times daily with a meal.   Yes Historical Provider, MD  Propylene Glycol (SYSTANE BALANCE OP) Apply 1 drop to eye 4 (four) times daily as needed (dry eyes).   Yes Historical Provider, MD  simvastatin (ZOCOR) 20 MG tablet Take 1 tablet (20 mg total) by mouth daily at 6 PM. 03/17/14  Yes Scott T Weaver, PA-C   BP 130/49 mmHg  Pulse 67  Temp(Src) 97.7 F (36.5 C) (Rectal)  Resp 16  Ht _0  (1.6 m)  Wt 185 lb (83.915 kg)  BMI 32.78 kg/m2  SpO2 94% Physical Exam  Constitutional: She appears well-developed and well-nourished. No distress.  HENT:  Head: Normocephalic and atraumatic.  Eyes: Pupils are equal, round, and reactive to light.  Neck: Normal range of motion. Neck supple.  Cardiovascular: Normal rate and regular rhythm.   Pulmonary/Chest: Effort normal.    Abdominal: Soft.  Musculoskeletal:       Legs: No obvious signs of injury or bruising to her upper or lower extremities. She does have tenderness to palpation of her right ankle and left knee.  Neurological: She is alert.  Cranial nerves II-VIII and  X-XII evaluated and show no deficits. Pt alert and oriented x 3 Upper and lower extremity strength is symmetrical and physiologic Normal muscular tone No facial droop Coordination intact, no limb ataxia, finger-nose-finger normal Rapid alternating movements normal No pronator drift  Skin: Skin is warm and dry.  Nursing note and vitals reviewed.   ED Course  Procedures (including critical care time) Labs Review Labs Reviewed  CBC - Abnormal; Notable for the following:    RBC 3.65 (*)    Hemoglobin 9.8 (*)    HCT 32.1 (*)    RDW 17.1 (*)  All other components within normal limits  BASIC METABOLIC PANEL - Abnormal; Notable for the following:    Glucose, Bld 112 (*)    BUN 48 (*)    Creatinine, Ser 2.09 (*)    Calcium 8.3 (*)    GFR calc non Af Amer 23 (*)    GFR calc Af Amer 27 (*)    All other components within normal limits  PROTIME-INR - Abnormal; Notable for the following:    Prothrombin Time 15.8 (*)    All other components within normal limits  CBG MONITORING, ED - Abnormal; Notable for the following:    Glucose-Capillary 124 (*)    All other components within normal limits  TROPONIN I  OCCULT BLOOD X 1 CARD TO LAB, STOOL  URINALYSIS, ROUTINE W REFLEX MICROSCOPIC  I-STAT TROPOININ, ED  POC OCCULT BLOOD, ED    Imaging Review Dg Ribs Unilateral W/chest Left  06/10/2014   CLINICAL DATA:  Fall from the same level. Syncopal episode well sitting on the yesterday. The patient fell to the floor. The patient had a second syncopal episode in the bathroom today after standing up. The patient was caught by for son before following completely. No head injury. Pain in the left upper posterior ribs. No loss of consciousness.  EXAM: LEFT RIBS AND CHEST - 3+ VIEW  COMPARISON:  Two-view chest x-ray 09/01/2008  FINDINGS: The heart is mildly enlarged. Mild pulmonary vascular congestion is evident. No focal airspace disease is present.  Dedicated imaging of the left ribs demonstrate no  acute or healing fractures.  IMPRESSION: 1. No acute or healing fractures. 2. Mild cardiomegaly without failure.   Electronically Signed   By: Lawrence Santiago M.D.   On: 06/10/2014 14:42   Dg Ankle Complete Right  06/10/2014   CLINICAL DATA:  Status post fall now with right ankle pain and inability to bear weight.  EXAM: RIGHT ANKLE - COMPLETE 3+ VIEW  COMPARISON:  Right foot series of April 03, 2014  FINDINGS: The bones are mildly osteopenic. The joint mortise is preserved but narrowed. There is no acute malleolar fracture. The talus and calcaneus are intact. There is moderate to severe degenerative change of the tarsometatarsal joints. There is soft tissue swelling especially laterally.  IMPRESSION: There is no acute fracture of the right ankle. There are significant degenerative changes of the tarsometatarsal joints.   Electronically Signed   By: David  Martinique   On: 06/10/2014 14:52   Ct Head Wo Contrast  06/10/2014   CLINICAL DATA:  Syncope.  EXAM: CT HEAD WITHOUT CONTRAST  TECHNIQUE: Contiguous axial images were obtained from the base of the skull through the vertex without intravenous contrast.  COMPARISON:  01/21/2014  FINDINGS: There is no acute intracranial hemorrhage, infarction, or mass lesion. Brain parenchyma is normal. Osseous structures are normal.  IMPRESSION: Normal exam.   Electronically Signed   By: Rozetta Nunnery M.D.   On: 06/10/2014 14:50   Dg Knee Complete 4 Views Left  06/10/2014   CLINICAL DATA:  Left knee pain after fall.  EXAM: LEFT KNEE - COMPLETE 4+ VIEW  COMPARISON:  None.  FINDINGS: There is no evidence of fracture, dislocation, or joint effusion. Severe narrowing of lateral joint space is noted. Osteophyte formation is noted. Mild osteophyte formation is noted medially. Soft tissues are unremarkable.  IMPRESSION: Severe degenerative joint disease is noted laterally. No acute abnormality seen in the left knee.   Electronically Signed   By: Sabino Dick M.D.   On:  06/10/2014  14:41     EKG Interpretation   Date/Time:  Tuesday June 10 2014 13:19:34 EST Ventricular Rate:  74 PR Interval:  151 QRS Duration: 81 QT Interval:  458 QTC Calculation: 508 R Axis:   68 Text Interpretation:  Sinus rhythm Nonspecific T abnormalities, lateral  leads Prolonged QT interval Sinus rhythm Artifact T wave abnormality  Abnormal ekg Confirmed by Carmin Muskrat  MD [phone]) on 06/10/2014 3:53:09  PM      MDM   Final diagnoses:  Syncope  Fall    Patient has had multiple syncopal episodes and no clear etiology on work up in the ED. She has been seen by Dr. Vanita Panda as well.  She has been admitted to Dr. Coralyn Pear, inpatient, tele, [facility] admits  Filed Vitals:   06/10/14 1511  BP:   Pulse: 67  Temp: 97.7 F (36.5 C)  Resp: [address], PA-C 06/10/14 1633

## 2014-06-11 ENCOUNTER — Observation Stay (HOSPITAL_COMMUNITY): Payer: PRIVATE HEALTH INSURANCE

## 2014-06-11 DIAGNOSIS — R55 Syncope and collapse: Secondary | ICD-10-CM | POA: Diagnosis not present

## 2014-06-11 LAB — URINE MICROSCOPIC-ADD ON

## 2014-06-11 LAB — TROPONIN I: Troponin I: <0.3 ng/mL (ref <0.30)

## 2014-06-11 LAB — COMPREHENSIVE METABOLIC PANEL
ALK PHOS: 65 U/L (ref 39–117)
ALT: 10 U/L (ref 0–35)
ANION GAP: 12 (ref 5–15)
AST: 19 U/L (ref 0–37)
Albumin: 2.9 g/dL — ABNORMAL LOW (ref 3.5–5.2)
BUN: 51 mg/dL — ABNORMAL HIGH (ref 6–23)
CHLORIDE: 108 meq/L (ref 96–112)
CO2: 24 mEq/L (ref 19–32)
Calcium: 7.9 mg/dL — ABNORMAL LOW (ref 8.4–10.5)
Creatinine, Ser: 2.32 mg/dL — ABNORMAL HIGH (ref 0.50–1.10)
GFR calc Af Amer: 24 mL/min — ABNORMAL LOW (ref >90)
GFR calc non Af Amer: 20 mL/min — ABNORMAL LOW (ref >90)
Glucose, Bld: 71 mg/dL (ref 70–99)
POTASSIUM: 5 meq/L (ref 3.7–5.3)
Sodium: 144 mEq/L (ref 137–147)
Total Protein: 6.2 g/dL (ref 6.0–8.3)

## 2014-06-11 LAB — GLUCOSE, CAPILLARY
GLUCOSE-CAPILLARY: 173 mg/dL — AB (ref 70–99)
Glucose-Capillary: 74 mg/dL (ref 70–99)
Glucose-Capillary: 98 mg/dL (ref 70–99)
Glucose-Capillary: 111 mg/dL — ABNORMAL HIGH (ref 70–99)

## 2014-06-11 LAB — URINALYSIS, ROUTINE W REFLEX MICROSCOPIC
Bilirubin Urine: NEGATIVE
GLUCOSE, UA: NEGATIVE mg/dL
Hgb urine dipstick: NEGATIVE
Ketones, ur: NEGATIVE mg/dL
Nitrite: NEGATIVE
PH: 5 (ref 5.0–8.0)
PROTEIN: 100 mg/dL — AB
Specific Gravity, Urine: 1.016 (ref 1.005–1.030)
Urobilinogen, UA: 0.2 mg/dL (ref 0.0–1.0)

## 2014-06-11 LAB — LIPID PANEL
CHOL/HDL RATIO: 2.9 ratio
Cholesterol: 148 mg/dL (ref 0–200)
HDL: 51 mg/dL (ref >39)
LDL CALC: 69 mg/dL (ref 0–99)
Triglycerides: 140 mg/dL (ref <150)
VLDL: 28 mg/dL (ref 0–40)

## 2014-06-11 LAB — HEMOGLOBIN A1C
Hgb A1c MFr Bld: 6.9 % — ABNORMAL HIGH (ref <5.7)
Mean Plasma Glucose: 151 mg/dL — ABNORMAL HIGH (ref <117)

## 2014-06-11 NOTE — Progress Notes (Signed)
UR completed 

## 2014-06-11 NOTE — Plan of Care (Signed)
Problem: Phase I Progression Outcomes Goal: Voiding-avoid urinary catheter unless indicated Outcome: Completed/Met Date Met:  06/11/14     

## 2014-06-11 NOTE — Progress Notes (Signed)
EEG completed, results pending. 

## 2014-06-11 NOTE — Progress Notes (Signed)
PROGRESS NOTE    Nithya ALLANTE SLIWA [id number] DOB: July 11, 1946 DOA: 06/10/2014 PCP: Benito Mccreedy, MD  HPI/Brief narrative 68 year old female with history of chronic diastolic CHF,, EF 0000000 percent by echo 05/14/14, DM 2, HTN, got up from her bed and went to use the toilet on 06/09/14 at approximately 9 AM and after urinating, passed out next to the commode for an undetermined amount of time. She gives history of sweating even before she got up from her bed but denied clear dizziness, lightheadedness, chest pain or palpitations. After she woke up she was unable to immediately get up and dragged herself to the bedroom and laid on the floor until her daughter was able to reach her. In the ED, Creatinine was 2.09, Hb 9.8, trop neg, Xrays of chest, right ankle, left knee and CT head neg.   Assessment/Plan:  1. Syncope: Unclear etiology. DD-micturition syncope, vasovagal, orthostatic hypotension, arrhythmias and seizures-less likely. CT head negative for acute findings. Telemetry without arrhythmias. No orthostatic blood pressure changes last night. EEG normal. A recent echo with normal EF. Carotid Doppler 01/22/14 shows 1-39 percent ICA stenosis bilaterally by end-diastolic velocities and [id number] percent ICA stenosis by peak systolic velocities-probably not contributing.? Consider heart monitor at discharge. Troponins 3 negative. 2.  DM 2 with renal complications: Continue Levemir and SSI. Reasonable in patient control. 3. Dyslipidemia: Continue Statins. 4. Essential HTN: continue Hydralazine, Imdur and Coreg. 5. Stage III chronic kidney disease:Baseline creatinine is probably in the 2.2 range. 6. Anemia in chronic kidney disease: Stable.   Code Status: Full Family Communication: none at bedside Disposition Plan: SNF when medically stable   Consultants:  None  Procedures:  None  Antibiotics:  None   Subjective: Denies complaints. Denies dizziness, lightheadedness, chest pain,  palpitations or pain elsewhere.  Objective: Filed Vitals:   06/11/14 0800 06/11/14 1025 06/11/14 1400 06/11/14 1622  BP: 153/58 140/45 103/60 138/67  Pulse:   68   Temp:   98.8 F (37.1 C)   TempSrc:   Oral   Resp:   18   Height:      Weight:      SpO2:   97%     Intake/Output Summary (Last 24 hours) at 06/11/14 1741 Last data filed at 06/11/14 1623  Gross per 24 hour  Intake    249 ml  Output    200 ml  Net     49 ml   Filed Weights   06/10/14 1324 06/11/14 0429  Weight: 83.915 kg (185 lb) 89.54 kg (197 lb 6.4 oz)     Exam:  General exam: pleasant middle-aged female lying comfortably in bed. Respiratory system: Clear. No increased work of breathing. Cardiovascular system: S1 & S2 heard, RRR. No JVD, murmurs, gallops, clicks or pedal edema.telemetry: Sinus rhythm. Gastrointestinal system: Abdomen is nondistended, soft and nontender. Normal bowel sounds heard. Central nervous system: Alert and oriented. No focal neurological deficits. Extremities: Symmetric 5 x 5 power.   Data Reviewed: Basic Metabolic Panel:  Recent Labs Lab 06/10/14 1454 06/10/14 2020 06/11/14 0659  NA 144  --  144  K 5.1  --  5.0  CL 106  --  108  CO2 23  --  24  GLUCOSE 112*  --  71  BUN 48*  --  51*  CREATININE 2.09* 2.16* 2.32*  CALCIUM 8.3*  --  7.9*   Liver Function Tests:  Recent Labs Lab 06/11/14 0659  AST 19  ALT 10  ALKPHOS 65  BILITOT <0.2*  PROT  6.2  ALBUMIN 2.9*   No results for input(s): LIPASE, AMYLASE in the last 168 hours. No results for input(s): AMMONIA in the last 168 hours. CBC:  Recent Labs Lab 06/10/14 1454 06/10/14 2020  WBC 4.7 4.7  HGB 9.8* 9.4*  HCT 32.1* 30.8*  MCV 87.9 88.0  PLT 217 220   Cardiac Enzymes:  Recent Labs Lab 06/10/14 1454 06/10/14 2020 06/11/14 0050 06/11/14 0659  TROPONINI <0.30 <0.30 <0.30 <0.30   BNP (last 3 results)  Recent Labs  01/21/14 1020 01/21/14 1732  PROBNP 3351.0* 4302.0*   CBG:  Recent  Labs Lab 06/10/14 1336 06/10/14 2040 06/11/14 0737 06/11/14 1126 06/11/14 1633  GLUCAP 124* 146* 74 98 111*    No results found for this or any previous visit (from the past 240 hour(s)).         Studies: Dg Ribs Unilateral W/chest Left  06/10/2014   CLINICAL DATA:  Fall from the same level. Syncopal episode well sitting on the yesterday. The patient fell to the floor. The patient had a second syncopal episode in the bathroom today after standing up. The patient was caught by for son before following completely. No head injury. Pain in the left upper posterior ribs. No loss of consciousness.  EXAM: LEFT RIBS AND CHEST - 3+ VIEW  COMPARISON:  Two-view chest x-ray 09/01/2008  FINDINGS: The heart is mildly enlarged. Mild pulmonary vascular congestion is evident. No focal airspace disease is present.  Dedicated imaging of the left ribs demonstrate no acute or healing fractures.  IMPRESSION: 1. No acute or healing fractures. 2. Mild cardiomegaly without failure.   Electronically Signed   By: Lawrence Santiago M.D.   On: 06/10/2014 14:42   Dg Ankle Complete Right  06/10/2014   CLINICAL DATA:  Status post fall now with right ankle pain and inability to bear weight.  EXAM: RIGHT ANKLE - COMPLETE 3+ VIEW  COMPARISON:  Right foot series of April 03, 2014  FINDINGS: The bones are mildly osteopenic. The joint mortise is preserved but narrowed. There is no acute malleolar fracture. The talus and calcaneus are intact. There is moderate to severe degenerative change of the tarsometatarsal joints. There is soft tissue swelling especially laterally.  IMPRESSION: There is no acute fracture of the right ankle. There are significant degenerative changes of the tarsometatarsal joints.   Electronically Signed   By: David  Martinique   On: 06/10/2014 14:52   Ct Head Wo Contrast  06/10/2014   CLINICAL DATA:  Syncope.  EXAM: CT HEAD WITHOUT CONTRAST  TECHNIQUE: Contiguous axial images were obtained from the base of the  skull through the vertex without intravenous contrast.  COMPARISON:  01/21/2014  FINDINGS: There is no acute intracranial hemorrhage, infarction, or mass lesion. Brain parenchyma is normal. Osseous structures are normal.  IMPRESSION: Normal exam.   Electronically Signed   By: Rozetta Nunnery M.D.   On: 06/10/2014 14:50   Dg Knee Complete 4 Views Left  06/10/2014   CLINICAL DATA:  Left knee pain after fall.  EXAM: LEFT KNEE - COMPLETE 4+ VIEW  COMPARISON:  None.  FINDINGS: There is no evidence of fracture, dislocation, or joint effusion. Severe narrowing of lateral joint space is noted. Osteophyte formation is noted. Mild osteophyte formation is noted medially. Soft tissues are unremarkable.  IMPRESSION: Severe degenerative joint disease is noted laterally. No acute abnormality seen in the left knee.   Electronically Signed   By: Sabino Dick M.D.   On: 06/10/2014 14:41  Scheduled Meds: . aspirin EC  81 mg Oral Daily  . carvedilol  12.5 mg Oral BID WC  . docusate sodium  100 mg Oral BID  . enoxaparin (LOVENOX) injection  30 mg Subcutaneous Q24H  . hydrALAZINE  25 mg Oral TID  . insulin aspart  0-15 Units Subcutaneous TID WC  . insulin aspart  0-5 Units Subcutaneous QHS  . insulin detemir  12 Units Subcutaneous QHS  . isosorbide mononitrate  30 mg Oral Daily  . simvastatin  20 mg Oral q1800  . sodium chloride  3 mL Intravenous Q12H  . sodium chloride  3 mL Intravenous Q12H   Continuous Infusions:   Principal Problem:   Syncope and collapse Active Problems:   Hyperlipidemia   CKD stage 3 due to type 2 diabetes mellitus   CAD (coronary artery disease)   Chronic combined systolic and diastolic heart failure   Syncope    Time spent: 30 minutes.    Vernell Leep, MD, FACP, FHM. [facility] Pager [phone]  If 7PM-7AM, please contact night-coverage [email or web address] Password [email or web address] 06/11/2014, 5:41 PM    LOS: 1 day

## 2014-06-11 NOTE — Evaluation (Signed)
Occupational Therapy Evaluation Patient Details Name: Kathleen [name] MRN: [id number] DOB: June 24, 1946 Today's Date: 06/11/2014    History of Present Illness Kathleen [name] is a 68 y.o. female with a past medical history of chronic diastolic congestive heart failure having her last transthoracic echocardiogram on 05/14/2014, showing grade 1 diastolic dysfunction with EF of 50-55%, type 2 diabetes mellitus, hypertension, had a syncopal episode on 06/09/2014. Patient reporting pain in her usual state health, went to the bathroom at approximate 9 AM, set on the commode to urinate, became diaphoretic and dizzy and passed out on the bathroom floor. This was unwitnessed. She was on the floor for approximately 6 hours, unable to stand due to feeling weak and pain in her ankle. Family knocking on the door she reported crawling over to the for the door to unlock it. She denies chest pain or shortness of breath, though complains of generalized weakness. Family reporting patient having a decline in the past 24 hours. Labs in the emergency room included troponin which was negative. Imaging of ankle and knee did not reveal evidence of fracture or dislocation. Head CT showing no acute intracranial abnormalities. She denies chest pain, shortness of breath, nausea, vomiting, fevers, chills, dysuria, hematuria.        Clinical Impression   Pt was independent in sponge bathing, dressing, toileting, meal prep and light housekeeping prior to admission.  Pt presents with generalized weakness and soreness following the events leading to this admission.  She required max assist to stand and for LB ADL.  Pt will need post acute rehab prior to return home.  Recommending SNF. Will follow acutely.    Follow Up Recommendations  SNF    Equipment Recommendations       Recommendations for Other Services       Precautions / Restrictions Precautions Precautions: Fall Restrictions Weight Bearing Restrictions: No       Mobility Bed Mobility               General bed mobility comments: not tested, pt up in chair  Transfers Overall transfer level: Needs assistance Equipment used: Rolling walker (2 wheeled) Transfers: Sit to/from Stand Sit to Stand: Max assist         General transfer comment: with momentum, required 3 attempts, LE soreness interfering    Balance     Sitting balance-Leahy Scale: Good       Standing balance-Leahy Scale: Poor                              ADL Overall ADL's : Needs assistance/impaired Eating/Feeding: Independent;Sitting   Grooming: Wash/dry hands;Wash/dry face;Brushing hair;Sitting;Set up   Upper Body Bathing: Set up;Sitting   Lower Body Bathing: Sit to/from stand;Maximal assistance   Upper Body Dressing : Set up;Sitting   Lower Body Dressing: Maximal assistance;Sit to/from stand                 General ADL Comments: Pt unable to cross foot over opposite knee to donn socks as is her normal.     Vision                     Perception     Praxis      Pertinent Vitals/Pain Pain Assessment: Faces Faces Pain Scale: Hurts little more Pain Location: UEs and LEs Pain Descriptors / Indicators: Sore Pain Intervention(s): Repositioned;Monitored during session;Limited activity within patient's tolerance     Hand Dominance  Right   Extremity/Trunk Assessment Upper Extremity Assessment Upper Extremity Assessment: Generalized weakness (soreness limiting, full AROM)   Lower Extremity Assessment Lower Extremity Assessment: Defer to PT evaluation   Cervical / Trunk Assessment Cervical / Trunk Assessment: Kyphotic   Communication Communication Communication: No difficulties   Cognition Arousal/Alertness: Awake/alert Behavior During Therapy: WFL for tasks assessed/performed Overall Cognitive Status: Within Functional Limits for tasks assessed                     General Comments       Exercises        Shoulder Instructions      Home Living Family/patient expects to be discharged to:: Private residence Living Arrangements: Children Available Help at Discharge: Family;Available PRN/intermittently (son works days) Type of Home: House Home Access: Stairs to enter Technical brewer of Steps: 2 Entrance Stairs-Rails: Left Home Layout: One level     Bathroom Shower/Tub: Teacher, early years/pre: Handicapped height     Home Equipment: [place] - single point;Walker - 2 wheels   Additional Comments: pt alone during day while family at work      Prior Functioning/Environment Level of Independence: Independent        Comments: pt does not usually use AD though took RW home from hospital 6/15, I with ADL's, sponge bathes, does not drive    OT Diagnosis: Generalized weakness;Acute pain   OT Problem List: Decreased strength;Decreased activity tolerance;Impaired balance (sitting and/or standing);Decreased knowledge of use of DME or AE;Obesity;Pain   OT Treatment/Interventions: Self-care/ADL training;DME and/or AE instruction;Therapeutic activities;Patient/family education;Balance training    OT Goals(Current goals can be found in the care plan section) Acute Rehab OT Goals Patient Stated Goal: find out why she passed out OT Goal Formulation: With patient Time For Goal Achievement: 06/25/14 Potential to Achieve Goals: Good ADL Goals Pt Will Perform Grooming: with supervision;standing Pt Will Perform Lower Body Bathing: with supervision;sit to/from stand Pt Will Perform Lower Body Dressing: with supervision;sit to/from stand Pt Will Transfer to Toilet: with supervision;ambulating;bedside commode Pt Will Perform Toileting - Clothing Manipulation and hygiene: with supervision;sit to/from stand  OT Frequency: Min 2X/week   Barriers to D/C: Decreased caregiver support          Co-evaluation              End of Session    Activity Tolerance: Patient tolerated  treatment well Patient left: in chair;with call bell/phone within reach;with chair alarm set   Time: 1340-1405 OT Time Calculation (min): 25 min Charges:  OT General Charges $OT Visit: 1 Procedure OT Evaluation $Initial OT Evaluation Tier I: 1 Procedure OT Treatments $Self Care/Home Management : 8-22 mins G-Codes:    [name] 06/11/2014, 2:17 PM

## 2014-06-11 NOTE — Procedures (Signed)
ELECTROENCEPHALOGRAM REPORT  Patient: HONESTI PATSCHKE       Room #: [id number] EEG No. ID: [id number] Age: 68 y.o.        Sex: female Referring Physician: Algis Liming, A Report Date:  06/11/2014        Interpreting Physician: Anthony Sar  History: NATACHA IDEKER is an 68 y.o. female with a history of CHF, diabetes mellitus and hypertension who was admitted on an episode of consciousness on 06/09/2014, most likely a syncopal episode.  Indications for study:  Rule out possible new onset seizure disorder.  Technique: This is an 18 channel routine scalp EEG performed at the bedside with bipolar and monopolar montages arranged in accordance to the international 10/20 system of electrode placement.   Description: EEG recording was performed during wakefulness and during sleep. Background activity during wakefulness consisted predominantly 9 Hz alpha rhythm which attenuated well with eye-opening. Photic stimulation produced a minimal symmetrical occipital driving response. Hyperventilation was not performed. During sleep though was generalized slowing of cerebral activity, with symmetrical vertex waves, sleep spindles and arousal responses recorded during stage II of sleep. No epileptiform discharges were recorded. There was no abnormal slowing of cerebral activity.  Interpretation: This is a normal EEG recording during wakefulness and during sleep.   Rush Farmer M.D. [facility] [phone]

## 2014-06-11 NOTE — Evaluation (Signed)
Physical Therapy Evaluation Patient Details Name: Kathleen [name] MRN: [id number] DOB: 1946-04-11 Today's Date: 06/11/2014   History of Present Illness  Kathleen [name] is a 68 y.o. female with a past medical history of chronic diastolic congestive heart failure having her last transthoracic echocardiogram on 05/14/2014, showing grade 1 diastolic dysfunction with EF of 50-55%, type 2 diabetes mellitus, hypertension, had a syncopal episode on 06/09/2014. Patient reporting pain in her usual state health, went to the bathroom at approximate 9 AM, set on the commode to urinate, became diaphoretic and dizzy and passed out on the bathroom floor. This was unwitnessed. She was on the floor for approximately 6 hours, unable to stand due to feeling weak and pain in her ankle. Family knocking on the door she reported crawling over to the for the door to unlock it. She denies chest pain or shortness of breath, though complains of generalized weakness. Family reporting patient having a decline in the past 24 hours. Labs in the emergency room included troponin which was negative. Imaging of ankle and knee did not reveal evidence of fracture or dislocation. Head CT showing no acute intracranial abnormalities. She denies chest pain, shortness of breath, nausea, vomiting, fevers, chills, dysuria, hematuria.       Clinical Impression  Pt admitted with generalized weakness and soreness after syncopal episode and subsequent time down on floor. Pt currently with functional limitations due to the deficits listed below (see PT Problem List). Pt having difficulty with rising from bed/ chair, requiring mod A, min A for ambulation. Somewhat lethargic and slow to process today. Do not feel that at this point, pt safe to be home alone. Pt agreeable to further rehab in skilled setting. Pt will benefit from skilled PT to increase their independence and safety with mobility to allow discharge to the venue listed below. PT will continue  to follow.       Follow Up Recommendations SNF    Equipment Recommendations  None recommended by PT    Recommendations for Other Services OT consult     Precautions / Restrictions Precautions Precautions: Fall Restrictions Weight Bearing Restrictions: No      Mobility  Bed Mobility Overal bed mobility: Needs Assistance Bed Mobility: Supine to Sit     Supine to sit: Min assist     General bed mobility comments: was unable to get covers off self to get out of bed independently, min assist to remove covers, increased time to rise from [facility] elevated position, difficulty scooting to EOB though no physical assist given, vc's to continue to scoot out until feet on floor.  Transfers Overall transfer level: Needs assistance Equipment used: Rolling walker (2 wheeled) Transfers: Sit to/from Stand Sit to Stand: Mod assist         General transfer comment: performed from bed and chair, vc's for hand position, mod physical assist for wt shift fwd and power up. Pt having difficulty rising due to LE weakness, specifically knee and hip extensors, exacerbated by soreness from falling  Ambulation/Gait Ambulation/Gait assistance: Min assist Ambulation Distance (Feet): 25 Feet Assistive device: Rolling walker (2 wheeled) Gait Pattern/deviations: Step-through pattern;Decreased stride length;Trunk flexed Gait velocity: decreased Gait velocity interpretation: <1.8 ft/sec, indicative of risk for recurrent falls General Gait Details: pt has been ambulating without AD PTA but today required RW for ambulation, min A to steady, pt slightly dizzy with second bout of ambulation to w/c for EEG  Stairs            Wheelchair  Mobility    Modified Rankin (Stroke Patients Only)       Balance Overall balance assessment: Needs assistance Sitting-balance support: No upper extremity supported;Feet supported Sitting balance-Leahy Scale: Good     Standing balance support: Bilateral upper  extremity supported;During functional activity Standing balance-Leahy Scale: Poor Standing balance comment: generalized weakness bilateral LE's making UE support necessary                             Pertinent Vitals/Pain Pain Assessment: 0-10 Pain Score: 2  Pain Location: sore all over Pain Descriptors / Indicators: Sore Pain Intervention(s): Monitored during session    Home Living Family/patient expects to be discharged to:: Private residence Living Arrangements: Children Available Help at Discharge: Family;Available PRN/intermittently Type of Home: House Home Access: Stairs to enter Entrance Stairs-Rails: Left Entrance Stairs-Number of Steps: 2 Home Layout: One level Home Equipment: Cane - single point;Walker - 2 wheels Additional Comments: pt alone during day while family at work    Prior Function Level of Independence: Independent         Comments: pt does not usually use AD though took RW home from hospital 6/15, I with ADL's, does not drive     Hand Dominance   Dominant Hand: Right    Extremity/Trunk Assessment   Upper Extremity Assessment: Defer to OT evaluation;Generalized weakness           Lower Extremity Assessment: Generalized weakness;RLE deficits/detail;LLE deficits/detail RLE Deficits / Details: soreness bilateral LE's from falling, knee ext 2+/5, hip flex 2/5 LLE Deficits / Details: same as RLE  Cervical / Trunk Assessment: Kyphotic  Communication   Communication: No difficulties  Cognition Arousal/Alertness: Awake/alert Behavior During Therapy: WFL for tasks assessed/performed Overall Cognitive Status: Impaired/Different from baseline Area of Impairment: Problem solving;Following commands       Following Commands: Follows multi-step commands with increased time     Problem Solving: Slow processing General Comments: unsure of pt's baseline but today, she is slightly slow to respond, a bit lethargic, and "foggy"    General  Comments General comments (skin integrity, edema, etc.): discussed potential for life alert system for safety at home as well as practicing getting up from floor in home environment    Exercises        Assessment/Plan    PT Assessment Patient needs continued PT services  PT Diagnosis Difficulty walking;Generalized weakness;Acute pain;Abnormality of gait   PT Problem List Decreased strength;Decreased range of motion;Decreased activity tolerance;Decreased balance;Decreased mobility;Decreased knowledge of use of DME;Decreased safety awareness;Decreased knowledge of precautions;Pain  PT Treatment Interventions DME instruction;Gait training;Stair training;Therapeutic activities;Functional mobility training;Therapeutic exercise;Balance training;Patient/family education   PT Goals (Current goals can be found in the Care Plan section) Acute Rehab PT Goals Patient Stated Goal: find out why she passed out PT Goal Formulation: With patient Time For Goal Achievement: 06/25/14 Potential to Achieve Goals: Good    Frequency Min 3X/week   Barriers to discharge Decreased caregiver support home alone all day    Co-evaluation               End of Session Equipment Utilized During Treatment: Gait belt Activity Tolerance: Patient limited by fatigue;Patient limited by lethargy Patient left: in chair;with call bell/phone within reach;with chair alarm set Nurse Communication: Mobility status    Functional Assessment Tool Used: clinical judgement Functional Limitation: Mobility: Walking and moving around Mobility: Walking and Moving Around Current Status [id number]): At least 20 percent but less than 40 percent  impaired, limited or restricted Mobility: Walking and Moving Around Goal Status [phone]): At least 1 percent but less than 20 percent impaired, limited or restricted    Time: 0820-0901 PT Time Calculation (min): 41 min   Charges:   PT Evaluation $Initial PT Evaluation Tier I: 1  Procedure PT Treatments $Gait Training: 8-22 mins $Therapeutic Activity: 8-22 mins   PT G Codes:   Functional Assessment Tool Used: clinical judgement Functional Limitation: Mobility: Walking and moving around  [facility], PT  Acute Rehab Services  [place], [place] 06/11/2014, 9:50 AM

## 2014-06-11 NOTE — Plan of Care (Signed)
Problem: Phase I Progression Outcomes Goal: Pain controlled with appropriate interventions Outcome: Completed/Met Date Met:  06/11/14     

## 2014-06-12 DIAGNOSIS — R55 Syncope and collapse: Secondary | ICD-10-CM | POA: Diagnosis not present

## 2014-06-12 LAB — GLUCOSE, CAPILLARY
GLUCOSE-CAPILLARY: 109 mg/dL — AB (ref 70–99)
GLUCOSE-CAPILLARY: 173 mg/dL — AB (ref 70–99)
Glucose-Capillary: 77 mg/dL (ref 70–99)
Glucose-Capillary: 184 mg/dL — ABNORMAL HIGH (ref 70–99)

## 2014-06-12 LAB — BASIC METABOLIC PANEL
ANION GAP: 11 (ref 5–15)
BUN: 52 mg/dL — ABNORMAL HIGH (ref 6–23)
CHLORIDE: 108 meq/L (ref 96–112)
CO2: 24 meq/L (ref 19–32)
Calcium: 7.9 mg/dL — ABNORMAL LOW (ref 8.4–10.5)
Creatinine, Ser: 2.2 mg/dL — ABNORMAL HIGH (ref 0.50–1.10)
GFR calc non Af Amer: 22 mL/min — ABNORMAL LOW (ref >90)
GFR, EST AFRICAN AMERICAN: 25 mL/min — AB (ref >90)
Glucose, Bld: 98 mg/dL (ref 70–99)
POTASSIUM: 5 meq/L (ref 3.7–5.3)
SODIUM: 143 meq/L (ref 137–147)

## 2014-06-12 MED ORDER — INSULIN ASPART 100 UNIT/ML ~~LOC~~ SOLN: 0.0000 [IU] | Freq: Three times a day (TID) | SUBCUTANEOUS | Status: DC

## 2014-06-12 MED ORDER — INSULIN DETEMIR 100 UNIT/ML ~~LOC~~ SOLN: 10.0000 [IU] | Freq: Every day | SUBCUTANEOUS | Status: DC

## 2014-06-12 MED ORDER — ACETAMINOPHEN 325 MG PO TABS: 650.0000 mg | ORAL_TABLET | Freq: Four times a day (QID) | ORAL | Status: DC | PRN

## 2014-06-12 MED ORDER — INSULIN ASPART 100 UNIT/ML ~~LOC~~ SOLN
0.0000 [IU] | Freq: Three times a day (TID) | SUBCUTANEOUS | Status: DC
  Administered 2014-06-13: 2 [IU] via SUBCUTANEOUS

## 2014-06-12 NOTE — Progress Notes (Signed)
Report called to [facility]

## 2014-06-12 NOTE — Progress Notes (Signed)
CSW (Clinical Education officer, museum) provided pt with bed offers and she accepted bed at [facility]. CSW prepared pt dc packet and placed with shadow chart. CSW arranged non-emergent ambulance transport. Pt, pt family (left voicemail), pt nurse, and facility informed. CSW signing off.  [place], [place]

## 2014-06-12 NOTE — Progress Notes (Signed)
Addendum  Patient was being discharged and transport was at bedside to pick up patient when she started complaining of dizziness when she sat up from lying position. She describes this as moderate to severe. As per nursing, SBP 170s.  Assessment and plan We'll cancel discharge for today. Check orthostatic blood pressures again. Check MRI brain without contrast to rule out strokes. On review of chart, patient had similar hospitalization in June 2015 for dizziness and symptoms were felt to be consistent with BPPV.  Vernell Leep, MD, FACP, FHM. [facility] Pager [phone]  If 7PM-7AM, please contact night-coverage [email or web address] Password [email or web address] 06/12/2014, 5:19 PM

## 2014-06-12 NOTE — Discharge Summary (Addendum)
Physician Discharge Summary  Kathleen [name] RJJ:[id number] DOB: 1946/07/10 DOA: 06/10/2014  PCP: Benito Mccreedy, MD  Admit date: 06/10/2014 Discharge date: 06/13/2014  Time spent: Greater than 30 minutes  Recommendations for Outpatient Follow-up:  1. MD at SNF in 5 days with repeat labs (CBC,BMP, Mg & EKG). 2. Dr. Benito Mccreedy, PCP - upon DC from SNF. 3. Dr. Jenkins Rouge, Cardiology. 4. Recommend repeating carotid dopplers in 2-3 months. 5. Recommend outpatient nephrology consultation and follow-up 6. Monitor CBGs 3 times a day before meals and at bedtime and adjust insulins as needed. 7. Dr. Earnie Larsson, Neurosurgery in 3 months with repeat MRI brain with and without contrast.  Discharge Diagnoses:  Principal Problem:   Syncope and collapse Active Problems:   Hyperlipidemia   CKD stage 3 due to type 2 diabetes mellitus   CAD (coronary artery disease)   Chronic combined systolic and diastolic heart failure   Syncope   Discharge Condition: Improved & Stable  Diet recommendation: Heart Healthy & diabetic diet.  Filed Weights   06/11/14 0429 06/12/14 0606 06/13/14 0543  Weight: 89.54 kg (197 lb 6.4 oz) 88.95 kg (196 lb 1.6 oz) 86.41 kg (190 lb 8 oz)    History of present illness:  68 year old female with history of chronic diastolic CHF, EF 01-60 percent by echo 05/14/14, CAD- assigned to medical Rx post cath, DM 2, HTN, HLD, CKD got up from her bed and went to use the toilet on 06/09/14 at approximately 9 AM and after urinating, passed out next to the commode for an undetermined amount of time. She gives history of sweating even before she got up from her bed but denied clear dizziness, lightheadedness, chest pain or palpitations. After she woke up she was unable to immediately get up and dragged herself to the bedroom and laid on the floor until her daughter was able to reach her. In the ED, Creatinine was 2.09, Hb 9.8, trop neg, Xrays of chest, right ankle, left knee and CT  head neg.  Hospital Course:    1. Syncope: Unclear etiology. DD-micturition syncope, vasovagal, hypoglycemia, orthostatic hypotension, arrhythmias and seizures-less likely. CT head negative for acute findings. Telemetry without arrhythmias. No orthostatic blood pressure changes noted. EEG normal. A recent echo with normal EF(50-55 percent). Carotid Doppler 01/22/14 shows 1-39 percent ICA stenosis bilaterally by end-diastolic velocities and 10-93 percent ICA stenosis by peak systolic velocities-probably not contributing.Troponins 3 negative. Discussed with vascular surgery M.D. on call who was under impressed by carotid doppler results and recommended repeating Carotid dopplers in 6 months from previous one. May consider OP vascular surgery consultation. Cardiology consultation was requested to assess need for further workup with outpatient heart monitor. Please see detailed cardiology consultation-recommended that they do not see evidence of cardiac etiology for syncope and low risk for VT with no hemodynamically significant CAD and EF 50-55 percent. Cardiology also recommends avoiding QT prolonging medications due to mildly prolonged QTC on EKG. Recommended outpatient follow-up. Sugars have been tightly controlled despite being on reduced dose of Lantus. Hypoglycemia may also be a possibility. Patient was about to be discharged yesterday when she double up an episode of dizziness. Discharge was held. MRI brain was performed and findings were as outlined below. Discussed the MRI findings with Dr. Earnie Larsson, neurosurgery who recommended outpatient follow-up with him in 3 months with repeat MRI brain with and without contrast. EEG done during this admission was negative for seizures. In the absence of witnessed seizures, as discussed with neurology, will not start  on AEDs. She states that she has no symptoms suggestive of vertigo but has intermittent dizziness/lightheadedness which are usually in the upright  position suggesting orthostatic etiology although orthostatic blood pressure checks here have been repeatedly negative. She had prior admissions with similar complaints and assessed to have BPPV although current symptoms are not fully consistent with same. 2. DM 2 with renal complications: Continue reduced dose Levemir and SSI. DC metformin due to CKD. Reduced Levemir prior to discharge. Monitor closely. Hemoglobin A1c: 6.9 3. Dyslipidemia: Continue Statins. LDL 69 4. Essential HTN: continue Hydralazine, Imdur and Coreg. Controlled. 5. Stage III chronic kidney disease:Baseline creatinine is probably in the 2.2 range. Stable. Close OP follow up. Consider OP nephrology consultation and follow up, 6. Anemia in chronic kidney disease: Stable. 7. Incidental left parietal meningioma: Seen on MRI in 06/13/14. Discussed with neurosurgery today-please see above. Outpatient follow-up.  Consultations:  Cardiology  Procedures:  None    Discharge Exam:  Complaints:  Denies complaints. Denies chest pain, dyspnea, dizziness or lightheadedness.  Filed Vitals:   06/12/14 1338 06/12/14 1900 06/13/14 0543 06/13/14 1040  BP: 163/40 141/62 169/64 154/54  Pulse: 72 68 80 71  Temp: 98.1 F (36.7 C) 98.2 F (36.8 C) 98.1 F (36.7 C)   TempSrc: Oral Oral Oral   Resp: _0 Height:      Weight:   86.41 kg (190 lb 8 oz)   SpO2: 100% 98% 97% 95%   General exam: pleasant middle-aged female sitting up comfortably on chair this morning eating breakfast. Respiratory system: Clear. No increased work of breathing. Cardiovascular system: S1 & S2 heard, RRR. No JVD, murmurs, gallops, clicks or pedal edema.telemetry: Sinus rhythm. Gastrointestinal system: Abdomen is nondistended, soft and nontender. Normal bowel sounds heard. Central nervous system: Alert and oriented. No focal neurological deficits. Extremities: Symmetric 5 x 5 power.  Discharge Instructions      Discharge Instructions    (HEART  FAILURE PATIENTS) Call MD:  Anytime you have any of the following symptoms: 1) 3 pound weight gain in 24 hours or 5 pounds in 1 week 2) shortness of breath, with or without a dry hacking cough 3) swelling in the hands, feet or stomach 4) if you have to sleep on extra pillows at night in order to breathe.    Complete by:  As directed      Call MD for:    Complete by:  As directed   Passing out.     Diet - low sodium heart healthy    Complete by:  As directed      Diet Carb Modified    Complete by:  As directed      Increase activity slowly    Complete by:  As directed             Medication List    STOP taking these medications        pioglitazone-metformin 15-850 MG per tablet  Commonly known as:  ACTOPLUS MET      TAKE these medications        acetaminophen 325 MG tablet  Commonly known as:  TYLENOL  Take 2 tablets (650 mg total) by mouth every 6 (six) hours as needed for mild pain, moderate pain or headache (or Fever >/= 101).     aspirin 81 MG EC tablet  Take 1 tablet (81 mg total) by mouth daily.     carvedilol 6.25 MG tablet  Commonly known as:  COREG  Take 12.5  mg by mouth 2 (two) times daily with a meal.     cholecalciferol 1000 UNITS tablet  Commonly known as:  VITAMIN D  Take 1,000 Units by mouth daily.     furosemide 40 MG tablet  Commonly known as:  LASIX  Take 40 mg by mouth daily.     hydrALAZINE 25 MG tablet  Commonly known as:  APRESOLINE  Take 1 tablet (25 mg total) by mouth 3 (three) times daily.     insulin aspart 100 UNIT/ML injection  Commonly known as:  novoLOG  - Inject 0-9 Units into the skin 3 (three) times daily with meals. CBG < 70: implement hypoglycemia protocol  - CBG 70 - 120: 0 units  - CBG 121 - 150: 1 unit  - CBG 151 - 200: 2 units  - CBG 201 - 250: 3 units  - CBG 251 - 300: 5 units  - CBG 301 - 350: 7 units  - CBG 351 - 400: 9 units  - CBG > 400: call MD.     insulin detemir 100 UNIT/ML injection  Commonly known  as:  LEVEMIR  Inject 0.1 mLs (10 Units total) into the skin at bedtime.     isosorbide mononitrate 30 MG 24 hr tablet  Commonly known as:  IMDUR  Take 1 tablet (30 mg total) by mouth daily.     meclizine 12.5 MG tablet  Commonly known as:  ANTIVERT  Take 1 tablet (12.5 mg total) by mouth 3 (three) times daily as needed for dizziness or nausea.     simvastatin 20 MG tablet  Commonly known as:  ZOCOR  Take 1 tablet (20 mg total) by mouth daily at 6 PM.     SYSTANE BALANCE OP  Apply 1 drop to eye 4 (four) times daily as needed (dry eyes).       Follow-up Information    Follow up with OSEI-BONSU,GEORGE, MD.   Specialty:  Internal Medicine   Why:  Upon DC from SNF.   Contact information:   [address] [place] [address] [phone]       Follow up with MD at SNF. Schedule an appointment as soon as possible for a visit in 5 days.   Why:  To be seen with repeat labs (CBC, BMP, Mg & EKG).       The results of significant diagnostics from this hospitalization (including imaging, microbiology, ancillary and laboratory) are listed below for reference.    Significant Diagnostic Studies: Dg Ribs Unilateral W/chest Left  06/10/2014   CLINICAL DATA:  Fall from the same level. Syncopal episode well sitting on the yesterday. The patient fell to the floor. The patient had a second syncopal episode in the bathroom today after standing up. The patient was caught by for son before following completely. No head injury. Pain in the left upper posterior ribs. No loss of consciousness.  EXAM: LEFT RIBS AND CHEST - 3+ VIEW  COMPARISON:  Two-view chest x-ray 09/01/2008  FINDINGS: The heart is mildly enlarged. Mild pulmonary vascular congestion is evident. No focal airspace disease is present.  Dedicated imaging of the left ribs demonstrate no acute or healing fractures.  IMPRESSION: 1. No acute or healing fractures. 2. Mild cardiomegaly without failure.   Electronically Signed   By:  Lawrence Santiago M.D.   On: 06/10/2014 14:42   Dg Ankle Complete Right  06/10/2014   CLINICAL DATA:  Status post fall now with right ankle pain and inability  to bear weight.  EXAM: RIGHT ANKLE - COMPLETE 3+ VIEW  COMPARISON:  Right foot series of April 03, 2014  FINDINGS: The bones are mildly osteopenic. The joint mortise is preserved but narrowed. There is no acute malleolar fracture. The talus and calcaneus are intact. There is moderate to severe degenerative change of the tarsometatarsal joints. There is soft tissue swelling especially laterally.  IMPRESSION: There is no acute fracture of the right ankle. There are significant degenerative changes of the tarsometatarsal joints.   Electronically Signed   By: David  Martinique   On: 06/10/2014 14:52   Ct Head Wo Contrast  06/10/2014   CLINICAL DATA:  Syncope.  EXAM: CT HEAD WITHOUT CONTRAST  TECHNIQUE: Contiguous axial images were obtained from the base of the skull through the vertex without intravenous contrast.  COMPARISON:  01/21/2014  FINDINGS: There is no acute intracranial hemorrhage, infarction, or mass lesion. Brain parenchyma is normal. Osseous structures are normal.  IMPRESSION: Normal exam.   Electronically Signed   By: Rozetta Nunnery M.D.   On: 06/10/2014 14:50   Mr Brain Wo Contrast  06/13/2014   CLINICAL DATA:  Syncopal episode occurring 06/09/2014.  EXAM: MRI HEAD WITHOUT CONTRAST  TECHNIQUE: Multiplanar, multiecho pulse sequences of the brain and surrounding structures were obtained without intravenous contrast.  COMPARISON:  CT head 06/10/2014.  MR head 01/21/2014.  FINDINGS: No evidence for acute stroke, hemorrhage, hydrocephalus, or extra-axial fluid. Normal for age cerebral volume. Mild subcortical and periventricular T2 and FLAIR hyperintensities, likely chronic microvascular ischemic change. Flow voids are maintained throughout the carotid, basilar, and vertebral arteries. There are no areas of chronic hemorrhage. Partial empty sella. No  tonsillar herniation. Upper cervical region unremarkable.  There is a lenticular shaped LEFT parietal parasagittal extra-axial mass with slight restricted diffusion, slight mass effect on the underlying brain, without osseous reaction, approximately 12 x 21 by 8 mm consistent with a sessile meningioma in this elderly female. There is no associated vasogenic edema. The mass is separate from the superior sagittal sinus. No other similar abnormalities. Compared with MR from June, there is no change. Compared with the recent CT, the mass is difficult to visualize.  Visualized calvarium, skull base, and upper cervical osseous structures unremarkable. Scalp and extracranial soft tissues, orbits, sinuses, and mastoids show no acute process. BILATERAL cataract extraction.  IMPRESSION: No acute intracranial findings. Mild atrophy and small vessel disease.  Incidental LEFT parietal meningioma near the vertex. Slight mass effect on the underlying brain without associated vasogenic edema.   Electronically Signed   By: Rolla Flatten M.D.   On: 06/13/2014 08:27   Dg Knee Complete 4 Views Left  06/10/2014   CLINICAL DATA:  Left knee pain after fall.  EXAM: LEFT KNEE - COMPLETE 4+ VIEW  COMPARISON:  None.  FINDINGS: There is no evidence of fracture, dislocation, or joint effusion. Severe narrowing of lateral joint space is noted. Osteophyte formation is noted. Mild osteophyte formation is noted medially. Soft tissues are unremarkable.  IMPRESSION: Severe degenerative joint disease is noted laterally. No acute abnormality seen in the left knee.   Electronically Signed   By: Sabino Dick M.D.   On: 06/10/2014 14:41    Microbiology: No results found for this or any previous visit (from the past 240 hour(s)).   Labs:  Basic Metabolic Panel: 45/3/64: Significant for BUN 52, creatinine 2.2 and calcium 7.9.  LFTs 06/11/14:significant for total bilirubin <0.2 and albumin 2.9  CBC 06/10/14: significant for hemoglobin  9.4  troponins  4: Negative  CBGs ranging between 109-200   Discussed with patient's daughter, Ms. Ma Hillock on 11/5 -updated care and answered questions.   Signed:  Vernell Leep, MD, FACP, FHM. [facility] Pager [phone]  If 7PM-7AM, please contact night-coverage [email or web address] Password [email or web address] 06/13/2014, 2:21 PM

## 2014-06-12 NOTE — Progress Notes (Signed)
UR completed 

## 2014-06-12 NOTE — Plan of Care (Signed)
Problem: Discharge Progression Outcomes Goal: Discharge plan in place and appropriate Outcome: Completed/Met Date Met:  06/12/14 Goal: Pain controlled with appropriate interventions Outcome: Completed/Met Date Met:  06/12/14 Goal: Hemodynamically stable Outcome: Completed/Met Date Met:  41/14/64 Goal: Complications resolved/controlled Outcome: Completed/Met Date Met:  06/12/14 Goal: Tolerating diet Outcome: Completed/Met Date Met:  06/12/14 Goal: Activity appropriate for discharge plan Outcome: Completed/Met Date Met:  06/12/14 Goal: Other Discharge Outcomes/Goals Outcome: Completed/Met Date Met:  06/12/14 Discharge to  [facility]

## 2014-06-12 NOTE — Progress Notes (Signed)
Late entry due to missed g code.   05-Jul-2014 1300  OT G-codes **NOT FOR INPATIENT CLASS**  Functional Assessment Tool Used clinical judgement  Functional Limitation Self care  Self Care Current Status [phone]) CK  Self Care Goal Status [id number]) CI  07/05/2014 Nestor Lewandowsky, OTR/L Pager: [phone]

## 2014-06-12 NOTE — Progress Notes (Signed)
[facility] called and informed that discharge to them was cancelled

## 2014-06-12 NOTE — Clinical Social Work Placement (Addendum)
    [place] NOTE 06/12/2014  Patient:  Kathleen [name], Kathleen [name]  Account Number:  [id number] [place] date:  06/10/2014  Clinical Social Worker:  Adair Laundry  Date/time:  06/12/2014 09:53 AM  Clinical Social Work is seeking post-discharge placement for this patient at the following level of care:   [place]   (*CSW will update this form in Epic as items are completed)   06/12/2014  Patient/family provided with [facility] Department of Clinical Social Work's list of facilities offering this level of care within the geographic area requested by the patient (or if unable, by the patient's family).  06/12/2014  Patient/family informed of their freedom to choose among providers that offer the needed level of care, that participate in Medicare, Medicaid or managed care program needed by the patient, have an available bed and are willing to accept the patient.  06/12/2014  Patient/family informed of [facility]' ownership interest in [facility], as well as of the fact that they are under no obligation to receive care at this facility.  PASARR submitted to EDS on 06/12/2014 PASARR number received on 06/12/2014  FL2 transmitted to all facilities in geographic area requested by pt/family on  06/12/2014 FL2 transmitted to all facilities within larger geographic area on   Patient informed that his/her managed care company has contracts with or will negotiate with  certain facilities, including the following:     Patient/family informed of bed offers received:  06/12/2014 Patient chooses bed at [facility] Physician recommends and patient chooses bed at    Patient to be transferred to [facility]  on  06/13/2014 Patient to be transferred to facility by [facility] Patient and family notified of transfer on 06/12/2014 Name of family member notified:  [name] (left voicemail)  The following physician  request were entered in Epic: Physician Request  Please sign FL2.    Additional CommentsBerton Mount, [place]

## 2014-06-12 NOTE — Consult Note (Signed)
CARDIOLOGY CONSULT NOTE       Patient ID: Kathleen [name] MRN: [id number] DOB/AGE: 1945/08/20 68 y.o.  Admit date: 06/10/2014 Referring Physician:  Algis Liming Primary Physician: Benito Mccreedy, MD Primary Cardiologist:  Johnsie Cancel Reason for Consultation:  Syncope  Principal Problem:   Syncope and collapse Active Problems:   Hyperlipidemia   CKD stage 3 due to type 2 diabetes mellitus   CAD (coronary artery disease)   Chronic combined systolic and diastolic heart failure   Syncope   HPI:   Kathleen [name] is a 68 y.o. female with a hx of HTN, DM, HL, CKD (baseline Cr ~ 1.8), tobacco abuse. Admitted 6/16-6/25/15  with vertigo symptoms in setting of elevated BP. Brain MRI with incidental finding of a 3.5 x 2.8 cm meningioma. Echocardiogram demonstrated new cardiomyopathy with an EF of 25-30%. Myoview was abnormal with anterior and lateral ischemia as well as inferior scar with minimal peri-infarct ischemia. LHC demonstrated moderate to severe mid RCA stenosis and no significant CAD in the circumflex or LAD. Medical therapy was favored. ARB had been held to reduce the risk of contrast-induced nephropathy.    Studies:  - LHC (01/27/14): Mid RCA 70% then 50%. Medical therapy favored.  - Echo (01/22/14): Moderate LVH, EF 25-30%, diffuse HK (no RWMA), grade 1 diastolic dysfunction, normal RV function,  - Nuclear (01/23/14): Inferior scar with minimal peri-infarct ischemia, anterior and lateral ischemia, EF 41%  - Carotid US (01/22/14): Bilateral ICA 1-39% by end diast velocities/plaque morphology/ICA-CCA ratio (123456 by peak systolic velocity)  Recent Labs:  01/21/2014: HDL Cholesterol by NMR 53; LDL (calc) 173*; Pro B Natriuretic peptide (BNP) 4302.0*; TSH 0.661  01/28/2014: ALT 9; Hemoglobin 10.7*  03/03/2014: Creatinine 2.2*; Potassium 4.0   Echo 05/14/14 EF near normal  Study Conclusions  - Left ventricle: The cavity size was normal. Wall thickness was increased in a pattern  of moderate LVH. Systolic function was normal. The estimated ejection fraction was in the range of 50% to 55%. Regional wall motion abnormalities cannot be excluded. Doppler parameters are consistent with abnormal left ventricular relaxation (grade 1 diastolic dysfunction). The E/e&' ratio is >15, suggesting elevated LV filling pressure. - Mitral valve: Calcified annulus. Mildly thickened leaflets . There was mild regurgitation. - Left atrium: The atrium was normal in size. - Atrial septum: No defect or patent foramen ovale was identified. - Tricuspid valve: There was mild regurgitation. - Pulmonary arteries: PA peak pressure: 35 mm Hg (S).  Impressions:  - Compared to the prior echo in 01/2013, the EF has now near-normalized to 50-55%.  Readmitted 11/3 with what sounds like vasovagal/ micturition syncope  Awoke in am went to bathroom and felt diaphoretic passed out No palpitations chest pain or dyspnea ? On ground for hours.  In ER BS ok  R/O  CT negative.  No arrhythmia on monitor  Not orthostatic Going to rehab today    ROS All other systems reviewed and negative except as noted above  Past Medical History  Diagnosis Date  . Diabetes mellitus without complication   . CKD (chronic kidney disease), stage III     a. Borderline III-IV.  [name] Hypertension   . CAD (coronary artery disease)     a. Cath 01/2014: mild in LAD, mild-mod LCx, mod-severe RCA - for med rx.  . Systolic CHF     a. [id number]: dx with mixed ICM/NICM (out of proportion to CAD) EF 25-30% by echo.   . Hyperlipidemia   . Anemia     a.  01/2014: suspected of chronic disease, negative FOBT.  [name] Meningioma     a. Incidental dx 01/2014.  . Obesity   . History of echocardiogram     Echo (10/15):  Mod LVH, EF 50-55%, Gr 1 DD, MAC, mild MR, mild TR, PASP 35 mmHg    Family History  Problem Relation Age of Onset  . Diabetic kidney disease Mother   . Hypertension Mother   . Hypertension Father   . Heart failure Mother   .  Heart attack Mother   . Hyperlipidemia Mother     History   Social History  . Marital Status: Divorced    Spouse Name: N/A    Number of Children: N/A  . Years of Education: N/A   Occupational History  . Not on file.   Social History Main Topics  . Smoking status: Current Every Day Smoker -- 0.25 packs/day for 30 years    Types: Cigarettes  . Smokeless tobacco: Never Used  . Alcohol Use: No  . Drug Use: No  . Sexual Activity: No   Other Topics Concern  . Not on file   Social History Narrative    Past Surgical History  Procedure Laterality Date  . Abdominal hysterectomy    . Breast surgery    . Tonsillectomy       . aspirin EC  81 mg Oral Daily  . carvedilol  12.5 mg Oral BID WC  . docusate sodium  100 mg Oral BID  . enoxaparin (LOVENOX) injection  30 mg Subcutaneous Q24H  . hydrALAZINE  25 mg Oral TID  . insulin aspart  0-15 Units Subcutaneous TID WC  . insulin aspart  0-5 Units Subcutaneous QHS  . insulin aspart  0-9 Units Subcutaneous TID WC  . insulin detemir  12 Units Subcutaneous QHS  . isosorbide mononitrate  30 mg Oral Daily  . simvastatin  20 mg Oral q1800  . sodium chloride  3 mL Intravenous Q12H  . sodium chloride  3 mL Intravenous Q12H      Physical Exam: Blood pressure 133/57, pulse 71, temperature 98.8 F (37.1 C), temperature source Oral, resp. rate 16, height 5\' 3"  (1.6 m), weight 88.95 kg (196 lb 1.6 oz), SpO2 96 %.   Affect appropriate Obese black female  HEENT: normal Neck supple with no adenopathy JVP normal no bruits no thyromegaly Lungs clear with no wheezing and good diaphragmatic motion Heart:  S1/S2 no murmur, no rub, gallop or click PMI normal Abdomen: benighn, BS positve, no tenderness, no AAA no bruit.  No HSM or HJR Distal pulses intact with no bruits Plus one bilateral edema Neuro non-focal Skin warm and dry No muscular weakness   Labs:   Lab Results  Component Value Date   WBC 4.7 06/10/2014   HGB 9.4* 06/10/2014    HCT 30.8* 06/10/2014   MCV 88.0 06/10/2014   PLT 220 06/10/2014    Recent Labs Lab 06/11/14 0659 06/12/14 0329  NA 144 143  K 5.0 5.0  CL 108 108  CO2 24 24  BUN 51* 52*  CREATININE 2.32* 2.20*  CALCIUM 7.9* 7.9*  PROT 6.2  --   BILITOT <0.2*  --   ALKPHOS 65  --   ALT 10  --   AST 19  --   GLUCOSE 71 98   Lab Results  Component Value Date   TROPONINI <0.30 06/11/2014    Lab Results  Component Value Date   CHOL 148 06/11/2014   CHOL 253* 01/21/2014  Lab Results  Component Value Date   HDL 51 06/11/2014   HDL 53 01/21/2014   Lab Results  Component Value Date   LDLCALC 69 06/11/2014   LDLCALC 173* 01/21/2014   Lab Results  Component Value Date   TRIG 140 06/11/2014   TRIG 134 01/21/2014   Lab Results  Component Value Date   CHOLHDL 2.9 06/11/2014   CHOLHDL 4.8 01/21/2014   No results found for: LDLDIRECT    Radiology: Dg Ribs Unilateral W/chest Left  06/10/2014   CLINICAL DATA:  Fall from the same level. Syncopal episode well sitting on the yesterday. The patient fell to the floor. The patient had a second syncopal episode in the bathroom today after standing up. The patient was caught by for son before following completely. No head injury. Pain in the left upper posterior ribs. No loss of consciousness.  EXAM: LEFT RIBS AND CHEST - 3+ VIEW  COMPARISON:  Two-view chest x-ray 09/01/2008  FINDINGS: The heart is mildly enlarged. Mild pulmonary vascular congestion is evident. No focal airspace disease is present.  Dedicated imaging of the left ribs demonstrate no acute or healing fractures.  IMPRESSION: 1. No acute or healing fractures. 2. Mild cardiomegaly without failure.   Electronically Signed   By: Lawrence Santiago M.D.   On: 06/10/2014 14:42   Dg Ankle Complete Right  06/10/2014   CLINICAL DATA:  Status post fall now with right ankle pain and inability to bear weight.  EXAM: RIGHT ANKLE - COMPLETE 3+ VIEW  COMPARISON:  Right foot series of April 03, 2014   FINDINGS: The bones are mildly osteopenic. The joint mortise is preserved but narrowed. There is no acute malleolar fracture. The talus and calcaneus are intact. There is moderate to severe degenerative change of the tarsometatarsal joints. There is soft tissue swelling especially laterally.  IMPRESSION: There is no acute fracture of the right ankle. There are significant degenerative changes of the tarsometatarsal joints.   Electronically Signed   By: David  Martinique   On: 06/10/2014 14:52   Ct Head Wo Contrast  06/10/2014   CLINICAL DATA:  Syncope.  EXAM: CT HEAD WITHOUT CONTRAST  TECHNIQUE: Contiguous axial images were obtained from the base of the skull through the vertex without intravenous contrast.  COMPARISON:  01/21/2014  FINDINGS: There is no acute intracranial hemorrhage, infarction, or mass lesion. Brain parenchyma is normal. Osseous structures are normal.  IMPRESSION: Normal exam.   Electronically Signed   By: Rozetta Nunnery M.D.   On: 06/10/2014 14:50   Dg Knee Complete 4 Views Left  06/10/2014   CLINICAL DATA:  Left knee pain after fall.  EXAM: LEFT KNEE - COMPLETE 4+ VIEW  COMPARISON:  None.  FINDINGS: There is no evidence of fracture, dislocation, or joint effusion. Severe narrowing of lateral joint space is noted. Osteophyte formation is noted. Mild osteophyte formation is noted medially. Soft tissues are unremarkable.  IMPRESSION: Severe degenerative joint disease is noted laterally. No acute abnormality seen in the left knee.   Electronically Signed   By: Sabino Dick M.D.   On: 06/10/2014 14:41    EKG:  SR rate 74  Nonspecific ST segments QT 468    ASSESSMENT AND PLAN:  Syncope:  No evidence of cardiac etiology.  Low risk for VT with no hemodynamically significant CAD And EF 50-55%  Likely vasovagal or micturition The prolonged down time also does not support cardiac etiology  Will f/u as outpatient  QT on longish side but less  than 500 msec and no torsades or VT in hospital last 48  hrs.  Avoid QT prolonging drugs. HTN:  Continue hydralazine and beta blocker  ARB held previously for elevated Cr Chol:  Continue statin   Signed: Jenkins Rouge 06/12/2014, 1:14 PM

## 2014-06-12 NOTE — Plan of Care (Signed)
Problem: Phase II Progression Outcomes Goal: Progress activity as tolerated unless otherwise ordered Outcome: Progressing     

## 2014-06-12 NOTE — Progress Notes (Signed)
PTAR here to pick up patient and she started  C/O  light headness and they took her BP sys in the 170's , Merleen Nicely called MD Hongalgi. Discharged cancelled and MRI ordered with new [facility]

## 2014-06-12 NOTE — Clinical Social Work Psychosocial (Signed)
     Clinical Social Work Department BRIEF PSYCHOSOCIAL ASSESSMENT 06/12/2014  Patient:  Kathleen [name], Kathleen [name]     Account Number:  [id number]     Admit date:  06/10/2014  Clinical Social Worker:  Adair Laundry  Date/Time:  06/12/2014 09:44 AM  Referred by:  Physician  Date Referred:  06/12/2014 Referred for  SNF Placement   Other Referral:   Interview type:  Patient Other interview type:    PSYCHOSOCIAL DATA Living Status:  WITH ADULT CHILDREN Admitted from facility:   Level of care:   Primary support name:  Kathleen [name] Primary support relationship to patient:  CHILD, ADULT Degree of support available:   Pt has good support    CURRENT CONCERNS Current Concerns  Post-Acute Placement   Other Concerns:    SOCIAL WORK ASSESSMENT / PLAN CSW aware of PT recommendation for SNF. CSW visited pt room and disussed recommendation with pt. Pt informed CSW that she lives with her son but is mainly home alone when her son is working. Pt informed CSW she has never been to rehab but has had family and friends go and is agreeable. Pt reports that she will do whatever she needs to do that the hospital recommends. Pt familiar with [facility] and expressed this would be her first preference. Pt is agreeable to CSW sending referral to all [facility] preferred facility cannot offer a bed. Pt also made aware that she could likely be ready for dc today. Pt asking that CSW update her son on dc plan. CSW left voicemail for pt son and awaiting return phone call.   Assessment/plan status:  Psychosocial Support/Ongoing Assessment of Needs Other assessment/ plan:   Information/referral to community resources:   SNF list to be provided with bed offers if needed    PATIENTS/FAMILYS RESPONSE TO PLAN OF CARE: Pt is agreeable to ST rehab at SNF.      [place], [name]

## 2014-06-13 ENCOUNTER — Observation Stay (HOSPITAL_COMMUNITY): Payer: PRIVATE HEALTH INSURANCE

## 2014-06-13 DIAGNOSIS — R55 Syncope and collapse: Secondary | ICD-10-CM | POA: Diagnosis not present

## 2014-06-13 DIAGNOSIS — R42 Dizziness and giddiness: Secondary | ICD-10-CM

## 2014-06-13 LAB — GLUCOSE, CAPILLARY
GLUCOSE-CAPILLARY: 116 mg/dL — AB (ref 70–99)
Glucose-Capillary: 200 mg/dL — ABNORMAL HIGH (ref 70–99)

## 2014-06-13 NOTE — Progress Notes (Signed)
Patient ID: Kathleen [name], female   DOB: [birth year], 68 y.o.   MRN: [id number]    Subjective:  Denies SSCP, palpitations or Dyspnea   Objective:  Filed Vitals:   06/12/14 0606 06/12/14 1338 06/12/14 1900 06/13/14 0543  BP: 133/57 163/40 141/62 169/64  Pulse: 71 72 68 80  Temp: 98.8 F (37.1 C) 98.1 F (36.7 C) 98.2 F (36.8 C) 98.1 F (36.7 C)  TempSrc: Oral Oral Oral Oral  Resp: 16 16 18 20   Height:      Weight: 88.95 kg (196 lb 1.6 oz)   86.41 kg (190 lb 8 oz)  SpO2: 96% 100% 98% 97%    Intake/Output from previous day:  Intake/Output Summary (Last 24 hours) at 06/13/14 [id number] Last data filed at 06/12/14 1700  Gross per 24 hour  Intake    480 ml  Output      0 ml  Net    480 ml    Physical Exam: Affect appropriate Obese black female  HEENT: normal Neck supple with no adenopathy JVP normal no bruits no thyromegaly Lungs clear with no wheezing and good diaphragmatic motion Heart:  S1/S2 no murmur, no rub, gallop or click PMI normal Abdomen: benighn, BS positve, no tenderness, no AAA no bruit.  No HSM or HJR Distal pulses intact with no bruits No edema Neuro non-focal Skin warm and dry No muscular weakness  Lab Results: Basic Metabolic Panel:  Recent Labs  06/11/14 0659 06/12/14 0329  NA 144 143  K 5.0 5.0  CL 108 108  CO2 24 24  GLUCOSE 71 98  BUN 51* 52*  CREATININE 2.32* 2.20*  CALCIUM 7.9* 7.9*   Liver Function Tests:  Recent Labs  06/11/14 0659  AST 19  ALT 10  ALKPHOS 65  BILITOT <0.2*  PROT 6.2  ALBUMIN 2.9*  CBC:  Recent Labs  06/10/14 1454 06/10/14 2020  WBC 4.7 4.7  HGB 9.8* 9.4*  HCT 32.1* 30.8*  MCV 87.9 88.0  PLT 217 220   Cardiac Enzymes:  Recent Labs  06/10/14 2020 06/11/14 0050 06/11/14 0659  TROPONINI <0.30 <0.30 <0.30   Hemoglobin A1C:  Recent Labs  06/10/14 2020  HGBA1C 6.9*   Fasting Lipid Panel:  Recent Labs  06/11/14 0050  CHOL 148  HDL 51  LDLCALC 69  TRIG 140  CHOLHDL 2.9    Thyroid Function Tests:  Recent Labs  06/10/14 2020  TSH 0.801    Imaging: Mr Brain Wo Contrast  06/13/2014   CLINICAL DATA:  Syncopal episode occurring 06/09/2014.  EXAM: MRI HEAD WITHOUT CONTRAST  TECHNIQUE: Multiplanar, multiecho pulse sequences of the brain and surrounding structures were obtained without intravenous contrast.  COMPARISON:  CT head 06/10/2014.  MR head 01/21/2014.  FINDINGS: No evidence for acute stroke, hemorrhage, hydrocephalus, or extra-axial fluid. Normal for age cerebral volume. Mild subcortical and periventricular T2 and FLAIR hyperintensities, likely chronic microvascular ischemic change. Flow voids are maintained throughout the carotid, basilar, and vertebral arteries. There are no areas of chronic hemorrhage. Partial empty sella. No tonsillar herniation. Upper cervical region unremarkable.  There is a lenticular shaped LEFT parietal parasagittal extra-axial mass with slight restricted diffusion, slight mass effect on the underlying brain, without osseous reaction, approximately 12 x 21 by 8 mm consistent with a sessile meningioma in this elderly female. There is no associated vasogenic edema. The mass is separate from the superior sagittal sinus. No other similar abnormalities. Compared with MR from June, there is no change. Compared with the recent  CT, the mass is difficult to visualize.  Visualized calvarium, skull base, and upper cervical osseous structures unremarkable. Scalp and extracranial soft tissues, orbits, sinuses, and mastoids show no acute process. BILATERAL cataract extraction.  IMPRESSION: No acute intracranial findings. Mild atrophy and small vessel disease.  Incidental LEFT parietal meningioma near the vertex. Slight mass effect on the underlying brain without associated vasogenic edema.   Electronically Signed   By: Rolla Flatten M.D.   On: 06/13/2014 08:27    Cardiac Studies:  ECG: SR no acute changes no AV block    Telemetry:  NSR no VT   Echo:   10/715  Study Conclusions  - Left ventricle: The cavity size was normal. Wall thickness was increased in a pattern of moderate LVH. Systolic function was normal. The estimated ejection fraction was in the range of 50% to 55%. Regional wall motion abnormalities cannot be excluded. Doppler parameters are consistent with abnormal left ventricular relaxation (grade 1 diastolic dysfunction). The E/e&' ratio is >15, suggesting elevated LV filling pressure. - Mitral valve: Calcified annulus. Mildly thickened leaflets . There was mild regurgitation. - Left atrium: The atrium was normal in size. - Atrial septum: No defect or patent foramen ovale was identified. - Tricuspid valve: There was mild regurgitation. - Pulmonary arteries: PA peak pressure: 35 mm Hg (S).  Impressions:  - Compared to the prior echo in 01/2013, the EF has now near-normalized to 50-55%.  Medications:   . aspirin EC  81 mg Oral Daily  . carvedilol  12.5 mg Oral BID WC  . docusate sodium  100 mg Oral BID  . enoxaparin (LOVENOX) injection  30 mg Subcutaneous Q24H  . hydrALAZINE  25 mg Oral TID  . insulin aspart  0-15 Units Subcutaneous TID WC  . insulin aspart  0-5 Units Subcutaneous QHS  . insulin aspart  0-9 Units Subcutaneous TID WC  . insulin detemir  12 Units Subcutaneous QHS  . isosorbide mononitrate  30 mg Oral Daily  . simvastatin  20 mg Oral q1800  . sodium chloride  3 mL Intravenous Q12H  . sodium chloride  3 mL Intravenous Q12H       Assessment/Plan:  Syncope: No evidence of cardiac etiology. Low risk for VT with no hemodynamically significant CAD And EF 50-55% Likely vasovagal or micturition The prolonged down time also does not support cardiac etiology Will f/u as outpatient QT on longish side but less than 500 msec and no torsades or VT in hospital last 48 hrs. Avoid QT prolonging drugs. HTN: Continue hydralazine and beta blocker ARB held previously for elevated Cr Chol:  Continue statin   Ambulating this am  To bathroom with walker improved Ok for d/c   Jenkins Rouge 06/13/2014, 9:04 AM

## 2014-06-13 NOTE — Progress Notes (Signed)
CSW (Clinical Education officer, museum) updated pt dc packet and placed with shadow chart. CSW arranged non-emergent ambulance transport. Pt, pt family, pt nurse, and facility informed. CSW signing off.  [place], [place]

## 2014-06-13 NOTE — Plan of Care (Signed)
Problem: Phase II Progression Outcomes Goal: Progress activity as tolerated unless otherwise ordered Outcome: Completed/Met Date Met:  06/13/14 Goal: IV changed to normal saline lock Outcome: Completed/Met Date Met:  06/13/14

## 2014-06-13 NOTE — Progress Notes (Signed)
Physical Therapy Treatment Patient Details Name: LAFRANCES DOVERSPIKE MRN: [id number] DOB: 11/28/1945 Today's Date: 06/13/2014    History of Present Illness Hajira ZANITA REDBIRD is a 68 y.o. female with a past medical history of chronic diastolic congestive heart failure having her last transthoracic echocardiogram on 05/14/2014, showing grade 1 diastolic dysfunction with EF of 50-55%, type 2 diabetes mellitus, hypertension, had a syncopal episode on 06/09/2014. Patient reporting pain in her usual state health, went to the bathroom at approximate 9 AM, set on the commode to urinate, became diaphoretic and dizzy and passed out on the bathroom floor. This was unwitnessed. She was on the floor for approximately 6 hours, unable to stand due to feeling weak and pain in her ankle. Family knocking on the door she reported crawling over to the for the door to unlock it. She denies chest pain or shortness of breath, though complains of generalized weakness. Family reporting patient having a decline in the past 24 hours. Labs in the emergency room included troponin which was negative. Imaging of ankle and knee did not reveal evidence of fracture or dislocation. Head CT showing no acute intracranial abnormalities. She denies chest pain, shortness of breath, nausea, vomiting, fevers, chills, dysuria, hematuria.         PT Comments    Pt currently with functional limitations due to decreased endurance and decreased strength. Pt will benefit from skilled PT to increase independence and safety with mobility to allow discharge to SNF. Pt showed improvement in functional mobility since previous visit. Pt requires increased time to perform bed mobility tasks and transfers but did not require physical assistance from PT. Pt requires cuing with transfers and ambulation. Pt has history of dizziness which she did not want to have addressed during today's session. Pt did not want to accept challenges with ambulation because she stated  it makes her dizzy. Pt more than likely has some inner ear dysfunction that she has developed compensations for. Pt will benefit from continued therapy to increase her independence and safety with ambulation as well as vestibular therapy to address her dizziness.    Follow Up Recommendations  SNF     Equipment Recommendations  Rolling walker with 5" wheels    Recommendations for Other Services       Precautions / Restrictions Precautions Precautions: Fall Restrictions Weight Bearing Restrictions: No    Mobility  Bed Mobility Overal bed mobility: Modified Independent Bed Mobility: Rolling;Sit to Supine;Supine to Sit Rolling: Supervision   Supine to sit: Min guard Sit to supine: Min guard   General bed mobility comments: Pt able to maneuver herself in bed without physical assistance from PT. Had had some difficulty pushing self up from sidelying position but with increased time and 2 attempts patient was able to get herself to sitting EOB.   Transfers Overall transfer level: Modified independent Equipment used: Rolling walker (2 wheeled) Transfers: Sit to/from Stand Sit to Stand: Min guard         General transfer comment: Pt able to perform sit to stand from lowered bed with minimal verbal cuing from PT.   Ambulation/Gait Ambulation/Gait assistance: Min guard Ambulation Distance (Feet): 90 Feet Assistive device: Rolling walker (2 wheeled) Gait Pattern/deviations: Step-through pattern;Decreased stance time - left;Decreased stride length   Gait velocity interpretation: Below normal speed for age/gender General Gait Details: Pt able to ambulate without challenges safely using rolling walker and min gaurd from PT. PT wanted to challenge pt with ambulation but Pt not willing. Pt stated she  gets dizzy when turning her head with ambulation and just wanted to walk.    Stairs            Wheelchair Mobility    Modified Rankin (Stroke Patients Only)       Balance  Overall balance assessment: Needs assistance Sitting-balance support: No upper extremity supported;Feet unsupported Sitting balance-Leahy Scale: Good Sitting balance - Comments: Pt able to sit on bed and weight shift to hold conversation with PT standing behind her.    Standing balance support: Bilateral upper extremity supported;During functional activity Standing balance-Leahy Scale: Fair                      Cognition Arousal/Alertness: Awake/alert Behavior During Therapy: WFL for tasks assessed/performed Overall Cognitive Status: Within Functional Limits for tasks assessed                      Exercises      General Comments        Pertinent Vitals/Pain Pain Assessment: No/denies pain    Home Living                      Prior Function            PT Goals (current goals can now be found in the care plan section)      Frequency  Min 3X/week    PT Plan Current plan remains appropriate    Co-evaluation             End of Session Equipment Utilized During Treatment: Gait belt Activity Tolerance: Patient tolerated treatment well;Patient limited by fatigue Patient left: in bed;with call bell/phone within reach;with family/visitor present     Time: [id number] PT Time Calculation (min): 14 min  Charges:  $Gait Training: 8-22 mins                    Jearld Shines, SPT  06/13/2014, 4:17 PM   Jearld Shines, SPT  Acute Rehabilitation [phone] [facility] [phone] (pager)

## 2014-06-13 NOTE — Progress Notes (Signed)
CSW (Clinical Education officer, museum) confirmed with facility that pt can dc afterhours today or over the weekend if needed.  [place], [place]

## 2014-06-13 NOTE — Progress Notes (Signed)
Patient was planned for discharge to SNF on 06/12/14. When transport came to pick up patient, she complained of significant dizziness when she sat up from lying position. Discharge was thereby canceled. Orthostatic blood pressure checks were negative. MRI brain showed incidental left parietal meningioma. Discussed with neurosurgery Dr. Earnie Larsson on 06/13/14 who reviewed imaging and recommended no additional workup at this time but advised follow-up with him in the office in 3 months with repeat MRI brain with and without contrast. Seizure is a possibility however there was no witnessed seizures and EEG during this hospitalization was negative. As discussed with neurology, no AED's will be started. Patient has been counseled extensively regarding gradual maneuvers from lying to sitting and sitting to standing for orthostatic seeming symptoms. She verbalized understanding.  Today patient denies dizziness or any other complaints. Vital signs and physical exam unremarkable and stable.  Will discharge patient to SNF today.  Vernell Leep, MD, FACP, FHM. [facility] Pager [phone]  If 7PM-7AM, please contact night-coverage [email or web address] Password [email or web address] 06/13/2014, 2:36 PM

## 2014-06-13 NOTE — Plan of Care (Signed)
Problem: Phase III Progression Outcomes Goal: Voiding independently Outcome: Completed/Met Date Met:  06/13/14     

## 2014-06-13 NOTE — Plan of Care (Signed)
Problem: Phase III Progression Outcomes Goal: Pain controlled on oral analgesia Outcome: Completed/Met Date Met:  06/13/14     

## 2014-06-13 NOTE — Plan of Care (Signed)
Problem: Phase II Progression Outcomes Goal: Discharge plan established Outcome: Completed/Met Date Met:  06/13/14     

## 2014-07-01 ENCOUNTER — Telehealth: Payer: Self-pay | Admitting: Cardiovascular Disease

## 2014-07-01 NOTE — Telephone Encounter (Signed)
SPOKE  WITH PT   LABS  FROM 2 WEEKS  AGO  ARE  NORMAL  AND  WAS  TAKING SIMVASTATIN  AT  THAT  TIME  PT  AWARE  TO CONT. WITH SIMVASTATIN NOT  TAKE   LIPITOR./CY

## 2014-07-01 NOTE — Telephone Encounter (Signed)
New problem   Pt was discharged from hospital and is uncertain about is she need to take Zocor 20mg  and Lipitor 20mg .  Please advise if pt need to take both or one of these medications.

## 2014-07-14 ENCOUNTER — Ambulatory Visit (INDEPENDENT_AMBULATORY_CARE_PROVIDER_SITE_OTHER): Payer: PRIVATE HEALTH INSURANCE | Admitting: Podiatry

## 2014-07-14 DIAGNOSIS — B351 Tinea unguium: Secondary | ICD-10-CM

## 2014-07-14 DIAGNOSIS — M2010 Hallux valgus (acquired), unspecified foot: Secondary | ICD-10-CM

## 2014-07-14 DIAGNOSIS — M79673 Pain in unspecified foot: Secondary | ICD-10-CM

## 2014-07-14 NOTE — Progress Notes (Signed)
Subjective:     Patient ID: Kathleen [name], female   DOB: 1946/06/26, 68 y.o.   MRN: [id number]  HPI patient presents with nail disease 1-5 both feet that are thick yellow and painful when pressed   Review of Systems     Objective:   Physical Exam Neurovascular status intact with thick yellow brittle nailbeds 1-5 both feet that are painful    Assessment:     Mycotic nail infections with pain 1-5 both feet    Plan:     Debride painful nailbeds 1-5 both feet with no iatrogenic bleeding noted

## 2014-07-17 ENCOUNTER — Encounter (HOSPITAL_COMMUNITY): Payer: Self-pay | Admitting: Interventional Cardiology

## 2014-08-11 ENCOUNTER — Ambulatory Visit (INDEPENDENT_AMBULATORY_CARE_PROVIDER_SITE_OTHER): Payer: Medicare Other | Admitting: Podiatry

## 2014-08-11 DIAGNOSIS — M2010 Hallux valgus (acquired), unspecified foot: Secondary | ICD-10-CM

## 2014-08-11 DIAGNOSIS — E1140 Type 2 diabetes mellitus with diabetic neuropathy, unspecified: Secondary | ICD-10-CM

## 2014-08-11 DIAGNOSIS — E139 Other specified diabetes mellitus without complications: Secondary | ICD-10-CM

## 2014-08-13 NOTE — Progress Notes (Signed)
Subjective:     Patient ID: Kathleen [name], female   DOB: [birth year], 69 y.o.   MRN: [id number]  HPI patient has long-term diabetes with structural deformity of both feet and long-term need for diabetic shoes secondary to previous ulcerate of type condition   Review of Systems     Objective:   Physical Exam Neurovascular status unchanged with obesity is, getting factor long-term diabetes with diminishment of circulatory status and diminishment of neurological status bilateral with digital deformities    Assessment:     At risk diabetic with deformity    Plan:     Dispensed diabetic shoes and custom insoles with all instructions on usage and reappoint for reevaluation shoes fit well

## 2014-10-13 ENCOUNTER — Ambulatory Visit: Payer: PRIVATE HEALTH INSURANCE

## 2014-10-14 ENCOUNTER — Other Ambulatory Visit: Payer: Self-pay | Admitting: Physician Assistant

## 2014-10-15 ENCOUNTER — Encounter: Payer: Self-pay | Admitting: Podiatry

## 2014-10-15 ENCOUNTER — Ambulatory Visit (INDEPENDENT_AMBULATORY_CARE_PROVIDER_SITE_OTHER): Payer: Medicare Other | Admitting: Podiatry

## 2014-10-15 VITALS — BP 138/66 | HR 79 | Resp 18

## 2014-10-15 DIAGNOSIS — M79673 Pain in unspecified foot: Secondary | ICD-10-CM

## 2014-10-15 DIAGNOSIS — B351 Tinea unguium: Secondary | ICD-10-CM

## 2014-10-15 NOTE — Patient Instructions (Signed)
Diabetes and Foot Care Diabetes may cause you to have problems because of poor blood supply (circulation) to your feet and legs. This may cause the skin on your feet to become thinner, break easier, and heal more slowly. Your skin may become dry, and the skin may peel and crack. You may also have nerve damage in your legs and feet causing decreased feeling in them. You may not notice minor injuries to your feet that could lead to infections or more serious problems. Taking care of your feet is one of the most important things you can do for yourself.  HOME CARE INSTRUCTIONS  Wear shoes at all times, even in the house. Do not go barefoot. Bare feet are easily injured.  Check your feet daily for blisters, cuts, and redness. If you cannot see the bottom of your feet, use a mirror or ask someone for help.  Wash your feet with warm water (do not use hot water) and mild soap. Then pat your feet and the areas between your toes until they are completely dry. Do not soak your feet as this can dry your skin.  Apply a moisturizing lotion or petroleum jelly (that does not contain alcohol and is unscented) to the skin on your feet and to dry, brittle toenails. Do not apply lotion between your toes.  Trim your toenails straight across. Do not dig under them or around the cuticle. File the edges of your nails with an emery board or nail file.  Do not cut corns or calluses or try to remove them with medicine.  Wear clean socks or stockings every day. Make sure they are not too tight. Do not wear knee-high stockings since they may decrease blood flow to your legs.  Wear shoes that fit properly and have enough cushioning. To break in new shoes, wear them for just a few hours a day. This prevents you from injuring your feet. Always look in your shoes before you put them on to be sure there are no objects inside.  Do not cross your legs. This may decrease the blood flow to your feet.  If you find a minor scrape,  cut, or break in the skin on your feet, keep it and the skin around it clean and dry. These areas may be cleansed with mild soap and water. Do not cleanse the area with peroxide, alcohol, or iodine.  When you remove an adhesive bandage, be sure not to damage the skin around it.  If you have a wound, look at it several times a day to make sure it is healing.  Do not use heating pads or hot water bottles. They may burn your skin. If you have lost feeling in your feet or legs, you may not know it is happening until it is too late.  Make sure your health care provider performs a complete foot exam at least annually or more often if you have foot problems. Report any cuts, sores, or bruises to your health care provider immediately. SEEK MEDICAL CARE IF:   You have an injury that is not healing.  You have cuts or breaks in the skin.  You have an ingrown nail.  You notice redness on your legs or feet.  You feel burning or tingling in your legs or feet.  You have pain or cramps in your legs and feet.  Your legs or feet are numb.  Your feet always feel cold. SEEK IMMEDIATE MEDICAL CARE IF:   There is increasing redness,   swelling, or pain in or around a wound.  There is a red line that goes up your leg.  Pus is coming from a wound.  You develop a fever or as directed by your health care provider.  You notice a bad smell coming from an ulcer or wound. Document Released: 07/22/2000 Document Revised: 03/27/2013 Document Reviewed: 01/01/2013 ExitCare Patient Information 2015 [employer]. This information is not intended to replace advice given to you by your health care provider. Make sure you discuss any questions you have with your health care provider.  

## 2014-10-15 NOTE — Progress Notes (Signed)
Patient ID: Kathleen [name], female   DOB: 1946/02/14, 69 y.o.   MRN: [id number]  Subjective: 69 y.o.-year-old female returns the office today for painful, elongated, thickened toenails. Denies any redness or drainage around the nails. Denies any acute changes since last appointment and no new complaints today. Denies any systemic complaints such as fevers, chills, nausea, vomiting.   Objective: AAO 3, NAD DP/PT pulses palpable, CRT less than 3 seconds Protective sensation decreased with Simms Weinstein monofilament, Achilles tendon reflex intact.  Nails hypertrophic, dystrophic, elongated, brittle, discolored 10. There is tenderness overlying these nails 1-5 bilaterally. There is no surrounding erythema or drainage along the nail sites. There is a small dried hemorrhagic appearing bulla at the distal aspect of the right fourth digit. There is no surrounding erythema, ascending cellulitis. There is no fluctuance or crepitus. There is no tenderness to the area, drainage, or other clinical signs of infection. No open lesions or other pre-ulcerative lesions are identified. No other areas of tenderness bilateral lower extremities. No overlying edema, erythema, increased warmth. No pain with calf compression, swelling, warmth, erythema.  Assessment: Patient presents with symptomatic onychomycosis  Plan: -Treatment options including alternatives, risks, complications were discussed -Nails sharply debrided 10 without complication/bleeding. -Monitor the right fourth toe for any changes. Discussed the patient is not resolved in 2 weeks to call the office for follow-up appointment or sooner if there is any problems. -Discussed daily foot inspection. If there are any changes, to call the office immediately.  -Follow-up in 3 months or sooner if any problems are to arise. In the meantime, encouraged to call the office with any questions, concerns, changes symptoms.

## 2014-11-06 ENCOUNTER — Other Ambulatory Visit: Payer: Self-pay | Admitting: Physician Assistant

## 2014-11-19 NOTE — Progress Notes (Signed)
Patient ID: Kathleen [name], female   DOB: October 02, 1945, 69 y.o.   MRN: [id number] Kathleen [name] is a 69 y.o. Kathleen [name] female with a hx of HTN, DM, HL, CKD (06/12/14  Baseline Cr 2.2 ), tobacco abuse. Admitted 6/16-6/25 with vertigo symptoms in setting of elevated BP. Brain MRI with incidental finding of a 3.5 x 2.8 cm meningioma. Echocardiogram demonstrated new cardiomyopathy with an EF of 25-30%. Myoview was abnormal with anterior and lateral ischemia as well as inferior scar with minimal peri-infarct ischemia. LHC demonstrated moderate to severe mid RCA stenosis and no significant CAD in the circumflex or LAD. Medical therapy was favored. ARB had been held to reduce the risk of contrast-induced nephropathy. I saw her in follow up 02/12/14. Follow up lab work demonstrated that creatinine remained slightly elevated. I decided to hold off on resuming her ARB. She returns for follow up.   Studies:  - LHC (01/27/14): Mid RCA 70% then 50%. Medical therapy favored.  - Echo (01/22/14): Moderate LVH, EF 25-30%, diffuse HK (no RWMA), grade 1 diastolic dysfunction, normal RV function,  - Nuclear (01/23/14): Inferior scar with minimal peri-infarct ischemia, anterior and lateral ischemia, EF 41%  - Carotid US (01/22/14): Bilateral ICA 1-39% by end diast velocities/plaque morphology/ICA-CCA ratio (123456 by peak systolic velocity)  Recent Labs:  01/21/2014: HDL Cholesterol by NMR 53; LDL (calc) 173*; Pro B Natriuretic peptide (BNP) 4302.0*; TSH 0.661  01/28/2014: ALT 9; Hemoglobin 10.7*  03/03/2014: Creatinine 2.2*; Potassium 4.0   Echo 05/14/14  EF near normal  Study Conclusions  - Left ventricle: The cavity size was normal. Wall thickness was increased in a pattern of moderate LVH. Systolic function was normal. The estimated ejection fraction was in the range of 50% to 55%. Regional wall motion abnormalities cannot be excluded. Doppler parameters are consistent with abnormal left ventricular relaxation (grade 1  diastolic dysfunction). The E/e&' ratio is >15, suggesting elevated LV filling pressure. - Mitral valve: Calcified annulus. Mildly thickened leaflets . There was mild regurgitation. - Left atrium: The atrium was normal in size. - Atrial septum: No defect or patent foramen ovale was identified. - Tricuspid valve: There was mild regurgitation. - Pulmonary arteries: PA peak pressure: 35 mm Hg (S).  Impressions:  - Compared to the prior echo in 01/2013, the EF has now near-normalized to 50-55%.  BP high today Compliant with meds  Sees Dr Florene Glen for kidneys  ROS: Denies fever, malais, weight loss, blurry vision, decreased visual acuity, cough, sputum, SOB, hemoptysis, pleuritic pain, palpitaitons, heartburn, abdominal pain, melena, lower extremity edema, claudication, or rash.  All other systems reviewed and negative  General: Affect appropriate Overweight black female  HEENT: normal Neck supple with no adenopathy JVP normal left  bruits no thyromegaly Lungs clear with no wheezing and good diaphragmatic motion Heart:  S1/S2 no murmur, no rub, gallop or click PMI normal Abdomen: benighn, BS positve, no tenderness, no AAA no bruit.  No HSM or HJR Distal pulses intact with no bruits No edema Neuro non-focal Skin warm and dry No muscular weakness   Current Outpatient Prescriptions  Medication Sig Dispense Refill  . acetaminophen (TYLENOL) 325 MG tablet Take 2 tablets (650 mg total) by mouth every 6 (six) hours as needed for mild pain, moderate pain or headache (or Fever >/= 101).    Kathleen [name] aspirin 81 MG EC tablet Take 1 tablet (81 mg total) by mouth daily. 30 tablet 3  . carvedilol (COREG) 12.5 MG tablet Take 12.5 mg by mouth 2 (two)  times daily with a meal.   8  . cholecalciferol (VITAMIN D) 1000 UNITS tablet Take 1,000 Units by mouth daily.    . COMBIGAN 0.2-0.5 % ophthalmic solution   0  . furosemide (LASIX) 40 MG tablet Take 40 mg by mouth daily.    . hydrALAZINE (APRESOLINE) 25 MG  tablet TAKE 1 TABLET (25 MG TOTAL) BY MOUTH 3 (THREE) TIMES DAILY. 90 tablet 1  . insulin aspart (NOVOLOG) 100 UNIT/ML injection Inject 0-9 Units into the skin 3 (three) times daily with meals. CBG < 70: implement hypoglycemia protocol CBG 70 - 120: 0 units CBG 121 - 150: 1 unit CBG 151 - 200: 2 units CBG 201 - 250: 3 units CBG 251 - 300: 5 units CBG 301 - 350: 7 units CBG 351 - 400: 9 units CBG > 400: call MD.    . insulin detemir (LEVEMIR) 100 UNIT/ML injection Inject 0.1 mLs (10 Units total) into the skin at bedtime.    . isosorbide mononitrate (IMDUR) 30 MG 24 hr tablet TAKE 1 TABLET (30 MG TOTAL) BY MOUTH DAILY. 90 tablet 1  . meclizine (ANTIVERT) 12.5 MG tablet Take 1 tablet (12.5 mg total) by mouth 3 (three) times daily as needed for dizziness or nausea. 30 tablet 0  . Multiple Vitamins-Minerals ([employer] SPECTRAVITE ADVANCED) TABS Take 1 tablet by mouth daily.  3  . Propylene Glycol (SYSTANE BALANCE OP) Apply 1 drop to eye 4 (four) times daily as needed (dry eyes).    . simvastatin (ZOCOR) 20 MG tablet Take 1 tablet (20 mg total) by mouth daily at 6 PM. 90 tablet 1   No current facility-administered medications for this visit.    Allergies  Review of patient's allergies indicates no known allergies.  Electrocardiogram:  06/10/14  SR rate 69 QT 456  Nonspecific ST/ [place] changes   Assessment and Plan

## 2014-11-20 ENCOUNTER — Ambulatory Visit (INDEPENDENT_AMBULATORY_CARE_PROVIDER_SITE_OTHER): Payer: Medicare Other | Admitting: Cardiovascular Disease

## 2014-11-20 ENCOUNTER — Encounter: Payer: Self-pay | Admitting: Cardiovascular Disease

## 2014-11-20 VITALS — BP 170/110 | HR 83 | Ht 63.0 in | Wt 191.8 lb

## 2014-11-20 DIAGNOSIS — R0989 Other specified symptoms and signs involving the circulatory and respiratory systems: Secondary | ICD-10-CM | POA: Diagnosis not present

## 2014-11-20 DIAGNOSIS — N183 Chronic kidney disease, stage 3 (moderate): Secondary | ICD-10-CM | POA: Diagnosis not present

## 2014-11-20 DIAGNOSIS — E1122 Type 2 diabetes mellitus with diabetic chronic kidney disease: Secondary | ICD-10-CM | POA: Diagnosis not present

## 2014-11-20 DIAGNOSIS — I10 Essential (primary) hypertension: Secondary | ICD-10-CM | POA: Diagnosis not present

## 2014-11-20 MED ORDER — HYDRALAZINE HCL 50 MG PO TABS: 50.0000 mg | ORAL_TABLET | Freq: Three times a day (TID) | ORAL | Status: DC

## 2014-11-20 NOTE — Assessment & Plan Note (Signed)
Related to uncontrolled DM/HTN  Continue current meds

## 2014-11-20 NOTE — Assessment & Plan Note (Signed)
Discussed low carb diet.  Target hemoglobin A1c is 6.5 or less.  Continue current medications.  

## 2014-11-20 NOTE — Patient Instructions (Signed)
Medication Instructions:  INCREASE HYDRALAZINE  TO  50 MG  THREE TIMES   A  DAY   Labwork: NONE  Testing/Procedures: CAROTID  Follow-Up: YEAR WITH  DR Johnsie Cancel  Any Other Special Instructions Will Be Listed Below (If Applicable). \

## 2014-11-20 NOTE — Assessment & Plan Note (Signed)
F/U duplex  ASA  No TIA symptoms

## 2014-11-20 NOTE — Assessment & Plan Note (Signed)
Increase hydralazine to 50 tid  F/u primary and renal

## 2014-11-20 NOTE — Assessment & Plan Note (Signed)
F/U Dr Florene Glen  Not on ACE due to low GFR

## 2014-11-24 ENCOUNTER — Ambulatory Visit (HOSPITAL_COMMUNITY): Payer: Medicare Other | Attending: Cardiovascular Disease | Admitting: Cardiology

## 2014-11-24 DIAGNOSIS — R0989 Other specified symptoms and signs involving the circulatory and respiratory systems: Secondary | ICD-10-CM | POA: Diagnosis not present

## 2014-11-24 DIAGNOSIS — I6523 Occlusion and stenosis of bilateral carotid arteries: Secondary | ICD-10-CM | POA: Insufficient documentation

## 2014-11-24 NOTE — Progress Notes (Signed)
Carotid duplex performed 

## 2015-01-15 ENCOUNTER — Ambulatory Visit (INDEPENDENT_AMBULATORY_CARE_PROVIDER_SITE_OTHER): Payer: Medicare Other | Admitting: Podiatry

## 2015-01-15 ENCOUNTER — Encounter: Payer: Self-pay | Admitting: Podiatry

## 2015-01-15 DIAGNOSIS — M79673 Pain in unspecified foot: Secondary | ICD-10-CM

## 2015-01-15 DIAGNOSIS — B351 Tinea unguium: Secondary | ICD-10-CM

## 2015-01-15 DIAGNOSIS — E1159 Type 2 diabetes mellitus with other circulatory complications: Secondary | ICD-10-CM

## 2015-01-15 DIAGNOSIS — E1151 Type 2 diabetes mellitus with diabetic peripheral angiopathy without gangrene: Secondary | ICD-10-CM

## 2015-01-15 NOTE — Progress Notes (Signed)
Patient ID: Kathleen [name], female   DOB: 1945/11/05, 69 y.o.   MRN: [id number] HPI  Complaint:  Visit Type: Patient returns to my office for continued preventative foot care services. Complaint: Patient states" my nails have grown long and thick and become painful to walk and wear shoes" Patient has been diagnosed with DM with vascular  complications. He presents for preventative foot care services. No changes to ROS  Podiatric Exam: Vascular: dorsalis pedis and posterior tibial pulses are negative. Capillary return is immediate. Temperature gradient is decreased  Sensorium: Normal Semmes Weinstein monofilament test. Normal tactile sensation bilaterally.  Nail Exam: Pt has thick disfigured discolored nails with subungual debris noted bilateral entire nail hallux through fifth toenails Ulcer Exam: There is no evidence of ulcer or pre-ulcerative changes or infection. Orthopedic Exam: Muscle tone and strength are WNL. No limitations in general ROM. No crepitus or effusions noted. Foot type and digits show no abnormalities. Bony prominences are unremarkable. Skin: No Porokeratosis. No infection or ulcers  Diagnosis:  Tinea unguium, Pain in right toe, pain in left toes  Treatment & Plan Procedures and Treatment: Consent by patient was obtained for treatment procedures. The patient understood the discussion of treatment and procedures well. All questions were answered thoroughly reviewed. Debridement of mycotic and hypertrophic toenails, 1 through 5 bilateral and clearing of subungual debris. No ulceration, no infection noted.  Return Visit-Office Procedure: Patient instructed to return to the office for a follow up visit 3 months for continued evaluation and treatment.

## 2015-01-16 ENCOUNTER — Ambulatory Visit: Payer: Medicare Other | Admitting: Podiatry

## 2015-02-02 ENCOUNTER — Other Ambulatory Visit: Payer: Self-pay

## 2015-03-26 DIAGNOSIS — E11331 Type 2 diabetes mellitus with moderate nonproliferative diabetic retinopathy with macular edema: Secondary | ICD-10-CM | POA: Diagnosis not present

## 2015-03-26 DIAGNOSIS — H04123 Dry eye syndrome of bilateral lacrimal glands: Secondary | ICD-10-CM | POA: Diagnosis not present

## 2015-04-17 ENCOUNTER — Encounter (INDEPENDENT_AMBULATORY_CARE_PROVIDER_SITE_OTHER): Payer: Medicare Other | Admitting: Ophthalmology

## 2015-04-17 DIAGNOSIS — H43813 Vitreous degeneration, bilateral: Secondary | ICD-10-CM

## 2015-04-17 DIAGNOSIS — E11331 Type 2 diabetes mellitus with moderate nonproliferative diabetic retinopathy with macular edema: Secondary | ICD-10-CM | POA: Diagnosis not present

## 2015-04-17 DIAGNOSIS — I10 Essential (primary) hypertension: Secondary | ICD-10-CM | POA: Diagnosis not present

## 2015-04-17 DIAGNOSIS — E11311 Type 2 diabetes mellitus with unspecified diabetic retinopathy with macular edema: Secondary | ICD-10-CM

## 2015-04-17 DIAGNOSIS — H35033 Hypertensive retinopathy, bilateral: Secondary | ICD-10-CM

## 2015-04-23 ENCOUNTER — Ambulatory Visit (INDEPENDENT_AMBULATORY_CARE_PROVIDER_SITE_OTHER): Payer: Medicare Other | Admitting: Podiatry

## 2015-04-23 ENCOUNTER — Encounter: Payer: Self-pay | Admitting: Podiatry

## 2015-04-23 DIAGNOSIS — E1159 Type 2 diabetes mellitus with other circulatory complications: Secondary | ICD-10-CM

## 2015-04-23 DIAGNOSIS — M79673 Pain in unspecified foot: Secondary | ICD-10-CM | POA: Diagnosis not present

## 2015-04-23 DIAGNOSIS — B351 Tinea unguium: Secondary | ICD-10-CM

## 2015-04-23 DIAGNOSIS — E1151 Type 2 diabetes mellitus with diabetic peripheral angiopathy without gangrene: Secondary | ICD-10-CM

## 2015-04-23 NOTE — Progress Notes (Signed)
Patient ID: Kathleen [name], female   DOB: October 28, 1945, 69 y.o.   MRN: [id number] HPI  Complaint:  Visit Type: Patient returns to my office for continued preventative foot care services. Complaint: Patient states" my nails have grown long and thick and become painful to walk and wear shoes" Patient has been diagnosed with DM with vascular  complications. He presents for preventative foot care services. No changes to ROS  Podiatric Exam: Vascular: dorsalis pedis and posterior tibial pulses are negative. Capillary return is immediate. Temperature gradient is decreased  Sensorium: Normal Semmes Weinstein monofilament test. Normal tactile sensation bilaterally.  Nail Exam: Pt has thick disfigured discolored nails with subungual debris noted bilateral entire nail hallux through fifth toenails Ulcer Exam: There is no evidence of ulcer or pre-ulcerative changes or infection. Orthopedic Exam: Muscle tone and strength are WNL. No limitations in general ROM. No crepitus or effusions noted. Foot type and digits show no abnormalities. Bony prominences are unremarkable. Skin: No Porokeratosis. No infection or ulcers  Diagnosis:  Tinea unguium, Pain in right toe, pain in left toes  Treatment & Plan Procedures and Treatment: Consent by patient was obtained for treatment procedures. The patient understood the discussion of treatment and procedures well. All questions were answered thoroughly reviewed. Debridement of mycotic and hypertrophic toenails, 1 through 5 bilateral and clearing of subungual debris. No ulceration, no infection noted. There is significant ulceration right leg above her medial malleolus which is draining.  She is scheduled for an appointment tomorrow. Return Visit-Office Procedure: Patient instructed to return to the office for a follow up visit 3 months for continued evaluation and treatment.

## 2015-04-24 ENCOUNTER — Encounter (INDEPENDENT_AMBULATORY_CARE_PROVIDER_SITE_OTHER): Payer: Medicare Other | Admitting: Ophthalmology

## 2015-05-07 ENCOUNTER — Encounter (HOSPITAL_COMMUNITY): Payer: Self-pay | Admitting: Neurology

## 2015-05-07 ENCOUNTER — Emergency Department (HOSPITAL_COMMUNITY)
Admission: EM | Admit: 2015-05-07 | Discharge: 2015-05-07 | Disposition: A | Discharge status: Home / Self Care | Payer: Medicare Other | Attending: Emergency Medicine | Admitting: Emergency Medicine

## 2015-05-07 DIAGNOSIS — I5020 Unspecified systolic (congestive) heart failure: Secondary | ICD-10-CM | POA: Diagnosis not present

## 2015-05-07 DIAGNOSIS — N183 Chronic kidney disease, stage 3 (moderate): Secondary | ICD-10-CM | POA: Diagnosis not present

## 2015-05-07 DIAGNOSIS — L03115 Cellulitis of right lower limb: Secondary | ICD-10-CM | POA: Diagnosis not present

## 2015-05-07 DIAGNOSIS — Z87891 Personal history of nicotine dependence: Secondary | ICD-10-CM | POA: Insufficient documentation

## 2015-05-07 DIAGNOSIS — Z86011 Personal history of benign neoplasm of the brain: Secondary | ICD-10-CM | POA: Diagnosis not present

## 2015-05-07 DIAGNOSIS — L02415 Cutaneous abscess of right lower limb: Secondary | ICD-10-CM | POA: Diagnosis not present

## 2015-05-07 DIAGNOSIS — M79604 Pain in right leg: Secondary | ICD-10-CM | POA: Diagnosis present

## 2015-05-07 DIAGNOSIS — I129 Hypertensive chronic kidney disease with stage 1 through stage 4 chronic kidney disease, or unspecified chronic kidney disease: Secondary | ICD-10-CM | POA: Insufficient documentation

## 2015-05-07 DIAGNOSIS — E119 Type 2 diabetes mellitus without complications: Secondary | ICD-10-CM | POA: Diagnosis not present

## 2015-05-07 DIAGNOSIS — L03119 Cellulitis of unspecified part of limb: Secondary | ICD-10-CM

## 2015-05-07 DIAGNOSIS — Z862 Personal history of diseases of the blood and blood-forming organs and certain disorders involving the immune mechanism: Secondary | ICD-10-CM | POA: Insufficient documentation

## 2015-05-07 DIAGNOSIS — I2510 Atherosclerotic heart disease of native coronary artery without angina pectoris: Secondary | ICD-10-CM | POA: Insufficient documentation

## 2015-05-07 DIAGNOSIS — Z79899 Other long term (current) drug therapy: Secondary | ICD-10-CM | POA: Insufficient documentation

## 2015-05-07 DIAGNOSIS — Z7982 Long term (current) use of aspirin: Secondary | ICD-10-CM | POA: Diagnosis not present

## 2015-05-07 DIAGNOSIS — E785 Hyperlipidemia, unspecified: Secondary | ICD-10-CM | POA: Insufficient documentation

## 2015-05-07 DIAGNOSIS — L02419 Cutaneous abscess of limb, unspecified: Secondary | ICD-10-CM

## 2015-05-07 DIAGNOSIS — E669 Obesity, unspecified: Secondary | ICD-10-CM | POA: Diagnosis not present

## 2015-05-07 DIAGNOSIS — Z794 Long term (current) use of insulin: Secondary | ICD-10-CM | POA: Insufficient documentation

## 2015-05-07 LAB — CBC WITH DIFFERENTIAL/PLATELET
Basophils Absolute: 0 10*3/uL (ref 0.0–0.1)
Basophils Relative: 0 %
EOS ABS: 0.2 10*3/uL (ref 0.0–0.7)
Eosinophils Relative: 4 %
HCT: 35.3 % — ABNORMAL LOW (ref 36.0–46.0)
Hemoglobin: 10.5 g/dL — ABNORMAL LOW (ref 12.0–15.0)
LYMPHS ABS: 0.9 10*3/uL (ref 0.7–4.0)
Lymphocytes Relative: 17 %
MCH: 26.6 pg (ref 26.0–34.0)
MCHC: 29.7 g/dL — AB (ref 30.0–36.0)
MCV: 89.6 fL (ref 78.0–100.0)
MONOS PCT: 7 %
Monocytes Absolute: 0.4 10*3/uL (ref 0.1–1.0)
Neutro Abs: 3.8 10*3/uL (ref 1.7–7.7)
Neutrophils Relative %: 72 %
PLATELETS: 211 10*3/uL (ref 150–400)
RBC: 3.94 MIL/uL (ref 3.87–5.11)
RDW: 15.5 % (ref 11.5–15.5)
WBC: 5.3 10*3/uL (ref 4.0–10.5)

## 2015-05-07 LAB — BASIC METABOLIC PANEL
Anion gap: 7 (ref 5–15)
BUN: 42 mg/dL — AB (ref 6–20)
CALCIUM: 7.2 mg/dL — AB (ref 8.9–10.3)
CO2: 25 mmol/L (ref 22–32)
CREATININE: 2.52 mg/dL — AB (ref 0.44–1.00)
Chloride: 110 mmol/L (ref 101–111)
GFR calc Af Amer: 21 mL/min — ABNORMAL LOW (ref >60)
GFR, EST NON AFRICAN AMERICAN: 18 mL/min — AB (ref >60)
Glucose, Bld: 68 mg/dL (ref 65–99)
Potassium: 4.5 mmol/L (ref 3.5–5.1)
SODIUM: 142 mmol/L (ref 135–145)

## 2015-05-07 LAB — CBG MONITORING, ED: GLUCOSE-CAPILLARY: 77 mg/dL (ref 65–99)

## 2015-05-07 MED ORDER — CLINDAMYCIN PHOSPHATE 600 MG/50ML IV SOLN
600.0000 mg | Freq: Once | INTRAVENOUS | Status: AC
  Administered 2015-05-07: 600 mg via INTRAVENOUS
  Filled 2015-05-07: qty 50

## 2015-05-07 MED ORDER — CLINDAMYCIN HCL 300 MG PO CAPS: 300.0000 mg | ORAL_CAPSULE | Freq: Four times a day (QID) | ORAL | Status: DC

## 2015-05-07 MED ORDER — SODIUM CHLORIDE 0.9 % IV BOLUS (SEPSIS)
1000.0000 mL | Freq: Once | INTRAVENOUS | Status: AC
  Administered 2015-05-07: 1000 mL via INTRAVENOUS

## 2015-05-07 NOTE — ED Notes (Signed)
Pt from home with son for eval of blister to right lower leg, pt states blister has been growing and today "popped". Pt wound noted to be leaking clear pus and reddness noted to lower leg. Mild edema noted to bilateral lower extremities. Pt reports 8/10 pain at this time, pulses weak but palpable. Pt alert and oriented, nad noted. Denies any cp or sob

## 2015-05-07 NOTE — ED Provider Notes (Addendum)
CSN: [id number]     Arrival date & time 05/07/15  1416 History   First MD Initiated Contact with Patient 05/07/15 1603     Chief Complaint  Patient presents with  . Leg Pain     (Consider location/radiation/quality/duration/timing/severity/associated sxs/prior Treatment) The history is provided by the patient.  Kathleen [name] is a 69 y.o. female hx of DM, CKD, CAD, here presenting with right leg cellulitis. Patient states that she noticed a blister in the right lower leg about a week ago. Has progressively gotten bigger and popped today. Denies any fall or injury. Denies any fevers or chills or cough or abdominal pain. Patient states that the wound is draining clear fluid. She has been taking her diabetes medicines as prescribed but has not been checking her blood sugars that often.    Past Medical History  Diagnosis Date  . Diabetes mellitus without complication   . CKD (chronic kidney disease), stage III     a. Borderline III-IV.  [name] Hypertension   . CAD (coronary artery disease)     a. Cath 01/2014: mild in LAD, mild-mod LCx, mod-severe RCA - for med rx.  . Systolic CHF     a. [id number]: dx with mixed ICM/NICM (out of proportion to CAD) EF 25-30% by echo.   . Hyperlipidemia   . Anemia     a. 01/2014: suspected of chronic disease, negative FOBT.  [name] Meningioma     a. Incidental dx 01/2014.  . Obesity   . History of echocardiogram     Echo (10/15):  Mod LVH, EF 50-55%, Gr 1 DD, MAC, mild MR, mild TR, PASP 35 mmHg   Past Surgical History  Procedure Laterality Date  . Abdominal hysterectomy    . Breast surgery    . Tonsillectomy    . Left heart catheterization with coronary angiogram N/A 01/27/2014    Procedure: LEFT HEART CATHETERIZATION WITH CORONARY ANGIOGRAM;  Surgeon: Jettie Booze, MD;  Location: [facility] CATH LAB;  Service: Cardiovascular;  Laterality: N/A;   Family History  Problem Relation Age of Onset  . Diabetic kidney disease Mother   . Hypertension Mother   .  Hypertension Father   . Heart failure Mother   . Heart attack Mother   . Hyperlipidemia Mother    Social History  Substance Use Topics  . Smoking status: Former Smoker -- 0.25 packs/day for 30 years    Types: Cigarettes  . Smokeless tobacco: Never Used  . Alcohol Use: No   OB History    No data available     Review of Systems  Skin: Positive for rash.  All other systems reviewed and are negative.     Allergies  Review of patient's allergies indicates no known allergies.  Home Medications   Prior to Admission medications   Medication Sig Start Date End Date Taking? Authorizing Provider  brimonidine-timolol (COMBIGAN) 0.2-0.5 % ophthalmic solution Place 1 drop into the right eye every 12 (twelve) hours.   Yes Historical Provider, MD  carvedilol (COREG) 12.5 MG tablet Take 12.5 mg by mouth 2 (two) times daily with a meal.  09/30/14  Yes Historical Provider, MD  furosemide (LASIX) 40 MG tablet Take 40 mg by mouth daily.   Yes Historical Provider, MD  hydrALAZINE (APRESOLINE) 50 MG tablet Take 1 tablet (50 mg total) by mouth 3 (three) times daily. 11/20/14  Yes Josue Hector, MD  insulin aspart (NOVOLOG) 100 UNIT/ML injection Inject 0-9 Units into the skin 3 (three) times daily  with meals. CBG < 70: implement hypoglycemia protocol CBG 70 - 120: 0 units CBG 121 - 150: 1 unit CBG 151 - 200: 2 units CBG 201 - 250: 3 units CBG 251 - 300: 5 units CBG 301 - 350: 7 units CBG 351 - 400: 9 units CBG > 400: call MD. Patient taking differently: Inject 6 Units into the skin 3 (three) times daily with meals. CBG < 70: implement hypoglycemia protocol CBG 70 - 120: 0 units CBG 121 - 150: 1 unit CBG 151 - 200: 2 units CBG 201 - 250: 3 units CBG 251 - 300: 5 units CBG 301 - 350: 7 units CBG 351 - 400: 9 units CBG > 400: call MD. 06/12/14  Yes Modena Jansky, MD  insulin detemir (LEVEMIR) 100 UNIT/ML injection Inject 0.1 mLs (10 Units total) into the skin at bedtime. 06/12/14  Yes Modena Jansky, MD  isosorbide mononitrate (IMDUR) 30 MG 24 hr tablet TAKE 1 TABLET (30 MG TOTAL) BY MOUTH DAILY. 11/07/14  Yes Josue Hector, MD  simvastatin (ZOCOR) 20 MG tablet Take 1 tablet (20 mg total) by mouth daily at 6 PM. 03/17/14  Yes Scott T Kathlen Mody, PA-C  acetaminophen (TYLENOL) 325 MG tablet Take 2 tablets (650 mg total) by mouth every 6 (six) hours as needed for mild pain, moderate pain or headache (or Fever >/= 101). Patient not taking: Reported on 05/07/2015 06/12/14   Modena Jansky, MD  aspirin 81 MG EC tablet Take 1 tablet (81 mg total) by mouth daily. Patient not taking: Reported on 05/07/2015 01/30/14   Verlee Monte, MD  meclizine (ANTIVERT) 12.5 MG tablet Take 1 tablet (12.5 mg total) by mouth 3 (three) times daily as needed for dizziness or nausea. Patient not taking: Reported on 05/07/2015 01/30/14   Verlee Monte, MD   BP 132/62 mmHg  Pulse 68  Temp(Src) 99.1 F (37.3 C) (Oral)  Resp 16  Ht 5\' 3"  (1.6 m)  Wt 200 lb (90.719 kg)  BMI 35.44 kg/m2  SpO2 92% Physical Exam  Constitutional: She is oriented to person, place, and time.  Chronically ill, NAD   HENT:  Head: Normocephalic.  Mouth/Throat: Oropharynx is clear and moist.  Eyes: Conjunctivae are normal. Pupils are equal, round, and reactive to light.  Neck: Normal range of motion. Neck supple.  Cardiovascular: Normal rate, regular rhythm and normal heart sounds.   Pulmonary/Chest: Effort normal and breath sounds normal. No respiratory distress. She has no wheezes. She has no rales.  Abdominal: Soft. Bowel sounds are normal. She exhibits no distension. There is no tenderness. There is no rebound.  Musculoskeletal:  R lower leg with blister calf area that popped with clear drainage. Another blister with clear fluid inside. Minimal erythema. 2+ pulses. No calf swelling   Neurological: She is alert and oriented to person, place, and time.  Skin: Skin is warm and dry.  Psychiatric: She has a normal mood and affect. Her  behavior is normal. Judgment and thought content normal.  Nursing note and vitals reviewed.   ED Course  Procedures (including critical care time)  The wound is cleansed, debrided of foreign material as much as possible, and dressed. The patient is alerted to watch for any signs of infection (redness, pus, pain, increased swelling or fever) and call if such occurs. Home wound care instructions are provided.    Labs Review Labs Reviewed  CBC WITH DIFFERENTIAL/PLATELET - Abnormal; Notable for the following:    Hemoglobin 10.5 (*)  HCT 35.3 (*)    MCHC 29.7 (*)    All other components within normal limits  BASIC METABOLIC PANEL - Abnormal; Notable for the following:    BUN 42 (*)    Creatinine, Ser 2.52 (*)    Calcium 7.2 (*)    GFR calc non Af Amer 18 (*)    GFR calc Af Amer 21 (*)    All other components within normal limits  CBG MONITORING, ED      Imaging Review No results found. I have personally reviewed and evaluated these images and lab results as part of my medical decision-making.   EKG Interpretation None      MDM   Final diagnoses:  None    Kathleen [name] is a 69 y.o. female here with R leg blister with minimal cellulitis. Vitals stable, she doesn't appear septic. Will check labs, give clinda empirically. Will likely need wound care follow up.   5:56 PM WBC nl. Cr 2.5, around baseline. Glucose nl. She was given clinda. I applied dressing to the wound. I consulted case management to have home care set up to change dressings and referred her to go to wound care center.     Wandra Arthurs, MD 05/07/15 1758  Wandra Arthurs, MD 05/07/15 [phone]

## 2015-05-07 NOTE — ED Notes (Signed)
Pt has redness to RLE and has large blister to right medial lower leg; has been there for several weeks but blister is bigger today. Leg is painful, has diabetes.

## 2015-05-07 NOTE — Discharge Instructions (Signed)
Take clindamycin four times daily for a week.   Home health will contact you regarding changing dressings.   See your doctor and wound care center.   Return to ER if you have worse redness, fever, leg swelling.

## 2015-05-08 NOTE — Progress Notes (Signed)
ED CM consulted by Dr. Darl Householder concerning [facility] services recommendations for wound care. Patient presented to [facility] ED with c/o of rt leg ulcer. CM reviewed patient's records, met with patient at bedside, confirmed information. Discussed the recommendations for [facility], patient is agreeable with plan. Offered choice of [address] agency patient selected [facility] states, she has had them in past. Verified contact information where patient will be receiving services. Referral faxed in to [facility] 336 [phone], fax confirmation received. Dr. Darl Householder also requesting a wound consult at the [facility], discussed with patient and she is amendable with the discharge plan. CM will send referral to Wound Clinic, informed patient that someone will contact her with 24-48 post discharge from [facility]. Patient verbalized understanding. Dr. Darl Householder updated is agreeable with the discharge plan. No further ED CM needs identified.

## 2015-05-15 ENCOUNTER — Ambulatory Visit (HOSPITAL_COMMUNITY)
Admission: RE | Admit: 2015-05-15 | Discharge: 2015-05-15 | Disposition: A | Discharge status: Home / Self Care | Payer: Medicare Other | Source: Ambulatory Visit | Attending: Internal Medicine | Admitting: Internal Medicine

## 2015-05-15 ENCOUNTER — Encounter (HOSPITAL_BASED_OUTPATIENT_CLINIC_OR_DEPARTMENT_OTHER): Payer: Medicare Other | Attending: Internal Medicine

## 2015-05-15 ENCOUNTER — Other Ambulatory Visit (HOSPITAL_COMMUNITY): Payer: Self-pay | Admitting: Internal Medicine

## 2015-05-15 DIAGNOSIS — I509 Heart failure, unspecified: Secondary | ICD-10-CM | POA: Diagnosis not present

## 2015-05-15 DIAGNOSIS — E11622 Type 2 diabetes mellitus with other skin ulcer: Secondary | ICD-10-CM | POA: Insufficient documentation

## 2015-05-15 DIAGNOSIS — M7989 Other specified soft tissue disorders: Secondary | ICD-10-CM | POA: Insufficient documentation

## 2015-05-15 DIAGNOSIS — E785 Hyperlipidemia, unspecified: Secondary | ICD-10-CM | POA: Insufficient documentation

## 2015-05-15 DIAGNOSIS — D649 Anemia, unspecified: Secondary | ICD-10-CM | POA: Diagnosis not present

## 2015-05-15 DIAGNOSIS — I10 Essential (primary) hypertension: Secondary | ICD-10-CM | POA: Insufficient documentation

## 2015-05-15 DIAGNOSIS — R609 Edema, unspecified: Secondary | ICD-10-CM

## 2015-05-15 DIAGNOSIS — E119 Type 2 diabetes mellitus without complications: Secondary | ICD-10-CM | POA: Insufficient documentation

## 2015-05-15 DIAGNOSIS — I2510 Atherosclerotic heart disease of native coronary artery without angina pectoris: Secondary | ICD-10-CM | POA: Insufficient documentation

## 2015-05-15 DIAGNOSIS — I87331 Chronic venous hypertension (idiopathic) with ulcer and inflammation of right lower extremity: Secondary | ICD-10-CM | POA: Diagnosis not present

## 2015-05-15 DIAGNOSIS — L97211 Non-pressure chronic ulcer of right calf limited to breakdown of skin: Secondary | ICD-10-CM | POA: Insufficient documentation

## 2015-05-15 LAB — GLUCOSE, CAPILLARY: Glucose-Capillary: 130 mg/dL — ABNORMAL HIGH (ref 65–99)

## 2015-05-15 NOTE — Progress Notes (Signed)
*  Preliminary Results* Right lower extremity venous duplex completed. Study was technically difficult due to wound location. Visualized veins of the right lower extremity are negative for deep vein thrombosis. There is no evidence of right Baker's cyst.  05/15/2015 11:56 AM  Maudry Mayhew, RVT, RDCS, RDMS

## 2015-05-22 DIAGNOSIS — I87331 Chronic venous hypertension (idiopathic) with ulcer and inflammation of right lower extremity: Secondary | ICD-10-CM | POA: Diagnosis not present

## 2015-05-22 DIAGNOSIS — L97211 Non-pressure chronic ulcer of right calf limited to breakdown of skin: Secondary | ICD-10-CM | POA: Diagnosis not present

## 2015-05-22 DIAGNOSIS — E11622 Type 2 diabetes mellitus with other skin ulcer: Secondary | ICD-10-CM | POA: Diagnosis not present

## 2015-05-22 DIAGNOSIS — I509 Heart failure, unspecified: Secondary | ICD-10-CM | POA: Diagnosis not present

## 2015-05-29 DIAGNOSIS — I509 Heart failure, unspecified: Secondary | ICD-10-CM | POA: Diagnosis not present

## 2015-05-29 DIAGNOSIS — I87331 Chronic venous hypertension (idiopathic) with ulcer and inflammation of right lower extremity: Secondary | ICD-10-CM | POA: Diagnosis not present

## 2015-05-29 DIAGNOSIS — E11622 Type 2 diabetes mellitus with other skin ulcer: Secondary | ICD-10-CM | POA: Diagnosis not present

## 2015-05-29 DIAGNOSIS — L97211 Non-pressure chronic ulcer of right calf limited to breakdown of skin: Secondary | ICD-10-CM | POA: Diagnosis not present

## 2015-06-05 DIAGNOSIS — I87331 Chronic venous hypertension (idiopathic) with ulcer and inflammation of right lower extremity: Secondary | ICD-10-CM | POA: Diagnosis not present

## 2015-06-05 DIAGNOSIS — I509 Heart failure, unspecified: Secondary | ICD-10-CM | POA: Diagnosis not present

## 2015-06-05 DIAGNOSIS — E11622 Type 2 diabetes mellitus with other skin ulcer: Secondary | ICD-10-CM | POA: Diagnosis not present

## 2015-06-05 DIAGNOSIS — L97211 Non-pressure chronic ulcer of right calf limited to breakdown of skin: Secondary | ICD-10-CM | POA: Diagnosis not present

## 2015-06-09 ENCOUNTER — Other Ambulatory Visit: Payer: Self-pay | Admitting: Physician Assistant

## 2015-06-12 ENCOUNTER — Encounter (HOSPITAL_BASED_OUTPATIENT_CLINIC_OR_DEPARTMENT_OTHER): Payer: Medicare Other | Attending: Internal Medicine

## 2015-06-12 DIAGNOSIS — L97821 Non-pressure chronic ulcer of other part of left lower leg limited to breakdown of skin: Secondary | ICD-10-CM | POA: Insufficient documentation

## 2015-06-12 DIAGNOSIS — H409 Unspecified glaucoma: Secondary | ICD-10-CM | POA: Diagnosis not present

## 2015-06-12 DIAGNOSIS — E1136 Type 2 diabetes mellitus with diabetic cataract: Secondary | ICD-10-CM | POA: Insufficient documentation

## 2015-06-12 DIAGNOSIS — I87331 Chronic venous hypertension (idiopathic) with ulcer and inflammation of right lower extremity: Secondary | ICD-10-CM | POA: Diagnosis not present

## 2015-06-12 DIAGNOSIS — I2510 Atherosclerotic heart disease of native coronary artery without angina pectoris: Secondary | ICD-10-CM | POA: Diagnosis not present

## 2015-06-12 DIAGNOSIS — E11622 Type 2 diabetes mellitus with other skin ulcer: Secondary | ICD-10-CM | POA: Insufficient documentation

## 2015-06-12 DIAGNOSIS — L97811 Non-pressure chronic ulcer of other part of right lower leg limited to breakdown of skin: Secondary | ICD-10-CM | POA: Insufficient documentation

## 2015-06-12 DIAGNOSIS — I509 Heart failure, unspecified: Secondary | ICD-10-CM | POA: Diagnosis not present

## 2015-06-12 DIAGNOSIS — I110 Hypertensive heart disease with heart failure: Secondary | ICD-10-CM | POA: Insufficient documentation

## 2015-06-19 DIAGNOSIS — I87331 Chronic venous hypertension (idiopathic) with ulcer and inflammation of right lower extremity: Secondary | ICD-10-CM | POA: Diagnosis not present

## 2015-06-19 DIAGNOSIS — L97811 Non-pressure chronic ulcer of other part of right lower leg limited to breakdown of skin: Secondary | ICD-10-CM | POA: Diagnosis not present

## 2015-06-19 DIAGNOSIS — E1136 Type 2 diabetes mellitus with diabetic cataract: Secondary | ICD-10-CM | POA: Diagnosis not present

## 2015-06-19 DIAGNOSIS — L97821 Non-pressure chronic ulcer of other part of left lower leg limited to breakdown of skin: Secondary | ICD-10-CM | POA: Diagnosis not present

## 2015-06-20 ENCOUNTER — Emergency Department (HOSPITAL_COMMUNITY): Payer: Medicare Other

## 2015-06-20 ENCOUNTER — Encounter (HOSPITAL_COMMUNITY): Payer: Self-pay | Admitting: Emergency Medicine

## 2015-06-20 ENCOUNTER — Emergency Department (HOSPITAL_COMMUNITY)
Admission: EM | Admit: 2015-06-20 | Discharge: 2015-06-20 | Disposition: A | Discharge status: Home / Self Care | Payer: Medicare Other | Attending: Emergency Medicine | Admitting: Emergency Medicine

## 2015-06-20 DIAGNOSIS — Z9889 Other specified postprocedural states: Secondary | ICD-10-CM | POA: Insufficient documentation

## 2015-06-20 DIAGNOSIS — I5020 Unspecified systolic (congestive) heart failure: Secondary | ICD-10-CM | POA: Insufficient documentation

## 2015-06-20 DIAGNOSIS — E669 Obesity, unspecified: Secondary | ICD-10-CM | POA: Diagnosis not present

## 2015-06-20 DIAGNOSIS — I129 Hypertensive chronic kidney disease with stage 1 through stage 4 chronic kidney disease, or unspecified chronic kidney disease: Secondary | ICD-10-CM | POA: Diagnosis not present

## 2015-06-20 DIAGNOSIS — N183 Chronic kidney disease, stage 3 (moderate): Secondary | ICD-10-CM | POA: Insufficient documentation

## 2015-06-20 DIAGNOSIS — E785 Hyperlipidemia, unspecified: Secondary | ICD-10-CM | POA: Diagnosis not present

## 2015-06-20 DIAGNOSIS — Z7982 Long term (current) use of aspirin: Secondary | ICD-10-CM | POA: Diagnosis not present

## 2015-06-20 DIAGNOSIS — Z862 Personal history of diseases of the blood and blood-forming organs and certain disorders involving the immune mechanism: Secondary | ICD-10-CM | POA: Insufficient documentation

## 2015-06-20 DIAGNOSIS — L03113 Cellulitis of right upper limb: Secondary | ICD-10-CM | POA: Insufficient documentation

## 2015-06-20 DIAGNOSIS — Z794 Long term (current) use of insulin: Secondary | ICD-10-CM | POA: Insufficient documentation

## 2015-06-20 DIAGNOSIS — L0390 Cellulitis, unspecified: Secondary | ICD-10-CM

## 2015-06-20 DIAGNOSIS — E119 Type 2 diabetes mellitus without complications: Secondary | ICD-10-CM | POA: Insufficient documentation

## 2015-06-20 DIAGNOSIS — I2510 Atherosclerotic heart disease of native coronary artery without angina pectoris: Secondary | ICD-10-CM | POA: Diagnosis not present

## 2015-06-20 DIAGNOSIS — R2231 Localized swelling, mass and lump, right upper limb: Secondary | ICD-10-CM | POA: Diagnosis present

## 2015-06-20 DIAGNOSIS — Z87891 Personal history of nicotine dependence: Secondary | ICD-10-CM | POA: Insufficient documentation

## 2015-06-20 DIAGNOSIS — Z79899 Other long term (current) drug therapy: Secondary | ICD-10-CM | POA: Insufficient documentation

## 2015-06-20 MED ORDER — CEPHALEXIN 500 MG PO CAPS: 500.0000 mg | ORAL_CAPSULE | Freq: Four times a day (QID) | ORAL | Status: DC

## 2015-06-20 MED ORDER — SULFAMETHOXAZOLE-TRIMETHOPRIM 800-160 MG PO TABS: 1.0000 | ORAL_TABLET | Freq: Two times a day (BID) | ORAL | Status: DC

## 2015-06-20 NOTE — ED Notes (Signed)
Pt from home c/o right hand swellign and redness since yesterday. Denies injury

## 2015-06-20 NOTE — ED Provider Notes (Signed)
CSN: [id number]     Arrival date & time 06/20/15  59 History   First MD Initiated Contact with Patient 06/20/15 1943     Chief Complaint  Patient presents with  . hand swelling      (Consider location/radiation/quality/duration/timing/severity/associated sxs/prior Treatment) Patient is a 69 y.o. female presenting with general illness. The history is provided by the patient and a relative.  Illness Severity:  Moderate Onset quality:  Gradual Duration:  2 days Timing:  Constant Progression:  Worsening Chronicity:  New Associated symptoms: no chest pain, no congestion, no fever, no headaches, no myalgias, no nausea, no rhinorrhea, no shortness of breath, no vomiting and no wheezing    69 yo F with a chief complaints of right hand pain and swelling. This been going on for a couple days. Patient denies fevers or chills. Patient denies injury. Patient denies breaks to the skin. Patient never had this happen to her before. Started as a small spot on the skin that was mildly painful just proximal to the right MTP. Patient had extension and swelling since then. Denies nausea or vomiting.  Past Medical History  Diagnosis Date  . Diabetes mellitus without complication ([place])   . CKD (chronic kidney disease), stage III     a. Borderline III-IV.  [name] Hypertension   . CAD (coronary artery disease)     a. Cath 01/2014: mild in LAD, mild-mod LCx, mod-severe RCA - for med rx.  . Systolic CHF ([place])     a. 01/2014: dx with mixed ICM/NICM (out of proportion to CAD) EF 25-30% by echo.   . Hyperlipidemia   . Anemia     a. 01/2014: suspected of chronic disease, negative FOBT.  [name] Meningioma ([place])     a. Incidental dx 01/2014.  . Obesity   . History of echocardiogram     Echo (10/15):  Mod LVH, EF 50-55%, Gr 1 DD, MAC, mild MR, mild TR, PASP 35 mmHg   Past Surgical History  Procedure Laterality Date  . Abdominal hysterectomy    . Breast surgery    . Tonsillectomy    . Left heart catheterization with  coronary angiogram N/A 01/27/2014    Procedure: LEFT HEART CATHETERIZATION WITH CORONARY ANGIOGRAM;  Surgeon: Jettie Booze, MD;  Location: [facility] CATH LAB;  Service: Cardiovascular;  Laterality: N/A;   Family History  Problem Relation Age of Onset  . Diabetic kidney disease Mother   . Hypertension Mother   . Hypertension Father   . Heart failure Mother   . Heart attack Mother   . Hyperlipidemia Mother    Social History  Substance Use Topics  . Smoking status: Former Smoker -- 0.25 packs/day for 30 years    Types: Cigarettes  . Smokeless tobacco: Never Used  . Alcohol Use: No   OB History    No data available     Review of Systems  Constitutional: Negative for fever and chills.  HENT: Negative for congestion and rhinorrhea.   Eyes: Negative for redness and visual disturbance.  Respiratory: Negative for shortness of breath and wheezing.   Cardiovascular: Negative for chest pain and palpitations.  Gastrointestinal: Negative for nausea and vomiting.  Genitourinary: Negative for dysuria and urgency.  Musculoskeletal: Negative for myalgias and arthralgias.  Skin: Negative for pallor and wound.  Neurological: Negative for dizziness and headaches.      Allergies  Review of patient's allergies indicates no known allergies.  Home Medications   Prior to Admission medications   Medication Sig  Start Date End Date Taking? Authorizing Provider  brimonidine-timolol (COMBIGAN) 0.2-0.5 % ophthalmic solution Place 1 drop into the right eye every 12 (twelve) hours.   Yes Historical Provider, MD  carvedilol (COREG) 12.5 MG tablet Take 12.5 mg by mouth 2 (two) times daily with a meal.  09/30/14  Yes Historical Provider, MD  furosemide (LASIX) 40 MG tablet Take 40 mg by mouth daily as needed for fluid or edema.    Yes Historical Provider, MD  hydrALAZINE (APRESOLINE) 50 MG tablet Take 1 tablet (50 mg total) by mouth 3 (three) times daily. 11/20/14  Yes Josue Hector, MD  insulin aspart  (NOVOLOG) 100 UNIT/ML injection Inject 0-9 Units into the skin 3 (three) times daily with meals. CBG < 70: implement hypoglycemia protocol CBG 70 - 120: 0 units CBG 121 - 150: 1 unit CBG 151 - 200: 2 units CBG 201 - 250: 3 units CBG 251 - 300: 5 units CBG 301 - 350: 7 units CBG 351 - 400: 9 units CBG > 400: call MD. Patient taking differently: Inject 6 Units into the skin 3 (three) times daily with meals. CBG < 70: implement hypoglycemia protocol CBG 70 - 120: 0 units CBG 121 - 150: 1 unit CBG 151 - 200: 2 units CBG 201 - 250: 3 units CBG 251 - 300: 5 units CBG 301 - 350: 7 units CBG 351 - 400: 9 units CBG > 400: call MD. 06/12/14  Yes Modena Jansky, MD  insulin detemir (LEVEMIR) 100 UNIT/ML injection Inject 0.1 mLs (10 Units total) into the skin at bedtime. 06/12/14  Yes Modena Jansky, MD  isosorbide mononitrate (IMDUR) 30 MG 24 hr tablet TAKE 1 TABLET (30 MG TOTAL) BY MOUTH DAILY. 11/07/14  Yes Josue Hector, MD  acetaminophen (TYLENOL) 325 MG tablet Take 2 tablets (650 mg total) by mouth every 6 (six) hours as needed for mild pain, moderate pain or headache (or Fever >/= 101). Patient not taking: Reported on 05/07/2015 06/12/14   Modena Jansky, MD  aspirin 81 MG EC tablet Take 1 tablet (81 mg total) by mouth daily. Patient not taking: Reported on 05/07/2015 01/30/14   Verlee Monte, MD  cephALEXin (KEFLEX) 500 MG capsule Take 1 capsule (500 mg total) by mouth 4 (four) times daily. 06/20/15   Deno Etienne, DO  clindamycin (CLEOCIN) 300 MG capsule Take 1 capsule (300 mg total) by mouth 4 (four) times daily. X 7 days Patient not taking: Reported on 06/20/2015 05/07/15   Wandra Arthurs, MD  isosorbide mononitrate (IMDUR) 30 MG 24 hr tablet TAKE 1 TABLET (30 MG TOTAL) BY MOUTH DAILY. Patient not taking: Reported on 06/20/2015 06/10/15   Josue Hector, MD  meclizine (ANTIVERT) 12.5 MG tablet Take 1 tablet (12.5 mg total) by mouth 3 (three) times daily as needed for dizziness or nausea. Patient  not taking: Reported on 05/07/2015 01/30/14   Verlee Monte, MD  simvastatin (ZOCOR) 20 MG tablet Take 1 tablet (20 mg total) by mouth daily at 6 PM. Patient not taking: Reported on 06/20/2015 03/17/14   Liliane Shi, PA-C  sulfamethoxazole-trimethoprim (BACTRIM DS,SEPTRA DS) 800-160 MG tablet Take 1 tablet by mouth 2 (two) times daily. 06/20/15   Deno Etienne, DO   BP 200/86 mmHg  Pulse 78  Temp(Src) 98.6 F (37 C) (Oral)  Resp 18  SpO2 94% Physical Exam  Constitutional: She is oriented to person, place, and time. She appears well-developed and well-nourished. No distress.  HENT:  Head: Normocephalic  and atraumatic.  Eyes: EOM are normal. Pupils are equal, round, and reactive to light.  Neck: Normal range of motion. Neck supple.  Cardiovascular: Normal rate and regular rhythm.  Exam reveals no gallop and no friction rub.   No murmur heard. Pulmonary/Chest: Effort normal. She has no wheezes. She has no rales.  Abdominal: Soft. She exhibits no distension. There is no tenderness. There is no rebound and no guarding.  Musculoskeletal: She exhibits edema and tenderness.  Erythema and swelling localized over the second MTP of the dorsal aspect of the right hand. Pain in mild fluctuance to the area. Patient able to range the finger but has pain with stretching of the skin.  Neurological: She is alert and oriented to person, place, and time.  Skin: Skin is warm and dry. She is not diaphoretic.  Psychiatric: She has a normal mood and affect. Her behavior is normal.  Nursing note and vitals reviewed.   ED Course  Procedures (including critical care time) Labs Review Labs Reviewed - No data to display  Imaging Review Dg Hand Complete Right  06/20/2015  CLINICAL DATA:  Acute onset of right hand pain and swelling. Initial encounter. EXAM: RIGHT HAND - COMPLETE 3+ VIEW COMPARISON:  None. FINDINGS: There is no evidence of fracture or dislocation. The joint spaces are preserved. The carpal rows are  intact, and demonstrate normal alignment. Diffuse soft tissue swelling is noted about the hand, extending to the proximal digits. IMPRESSION: No evidence of fracture or dislocation. Diffuse soft tissue swelling noted. Electronically Signed   By: Garald Balding M.D.   On: 06/20/2015 20:09   I have personally reviewed and evaluated these images and lab results as part of my medical decision-making.   EKG Interpretation None       Emergency Focused Ultrasound Exam Limited Ultrasound of Soft Tissue   Performed and interpreted by Dr. Tyrone Nine Indication: evaluation for infection or foreign body Transverse and Sagittal views of  are obtained in real time for the purposes of evaluation of skin and underlying soft tissues.  Findings: no heterogeneous fluid collection, with hyperemia/edema of surrounding tissue Interpretation: no abscess, with cellulitis Images archived electronically.  CPT Codes:    Upper extremity [id number]     MDM   Final diagnoses:  Cellulitis, unspecified cellulitis site, unspecified extremity site, unspecified laterality    69 yo F with a chief complaints of right hand pain and swelling. Most likely cellulitis. Patient with pain with range of motion of the second MTP feel likely stretching of the skin. Pain is localized to the skin area and not in the joint per the patient. Discussed evaluate to rule out septic arthritis is with a arthrocentesis. Patient declining at this time electing for trial of antibiotics. Will start on Keflex and Bactrim. Bedside ultrasound consistent with cellulitis.  X-ray negative for foreign body or fracture.  11:29 PM:  I have discussed the diagnosis/risks/treatment options with the patient and family and believe the pt to be eligible for discharge home to follow-up with PCP. We also discussed returning to the ED immediately if new or worsening sx occur. We discussed the sx which are most concerning (e.g., sudden worsening pain, fever,  inability to tolerate by mouth) that necessitate immediate return. Medications administered to the patient during their visit and any new prescriptions provided to the patient are listed below.  Medications given during this visit Medications - No data to display  Discharge Medication List as of 06/20/2015  8:46 PM  START taking these medications   Details  cephALEXin (KEFLEX) 500 MG capsule Take 1 capsule (500 mg total) by mouth 4 (four) times daily., Starting 06/20/2015, Until Discontinued, Print    sulfamethoxazole-trimethoprim (BACTRIM DS,SEPTRA DS) 800-160 MG tablet Take 1 tablet by mouth 2 (two) times daily., Starting 06/20/2015, Until Discontinued, Print        The patient appears reasonably screen and/or stabilized for discharge and I doubt any other medical condition or other [facility] requiring further screening, evaluation, or treatment in the ED at this time prior to discharge.      Deno Etienne, DO 06/20/15 2329

## 2015-06-20 NOTE — Discharge Instructions (Signed)

## 2015-07-27 ENCOUNTER — Other Ambulatory Visit: Payer: Self-pay | Admitting: Cardiovascular Disease

## 2015-08-04 ENCOUNTER — Ambulatory Visit: Payer: Medicare Other | Admitting: Podiatry

## 2015-08-05 ENCOUNTER — Inpatient Hospital Stay (HOSPITAL_COMMUNITY)
Admission: EM | Admit: 2015-08-05 | Discharge: 2015-08-14 | DRG: 291 | Disposition: A | Discharge status: Skilled Nursing Facility | Payer: Medicare Other | Attending: Internal Medicine | Admitting: Internal Medicine

## 2015-08-05 ENCOUNTER — Encounter (HOSPITAL_COMMUNITY): Payer: Self-pay | Admitting: Emergency Medicine

## 2015-08-05 ENCOUNTER — Emergency Department (HOSPITAL_COMMUNITY): Payer: Medicare Other

## 2015-08-05 DIAGNOSIS — E669 Obesity, unspecified: Secondary | ICD-10-CM | POA: Diagnosis not present

## 2015-08-05 DIAGNOSIS — Z7189 Other specified counseling: Secondary | ICD-10-CM | POA: Diagnosis not present

## 2015-08-05 DIAGNOSIS — R7989 Other specified abnormal findings of blood chemistry: Secondary | ICD-10-CM

## 2015-08-05 DIAGNOSIS — I5041 Acute combined systolic (congestive) and diastolic (congestive) heart failure: Secondary | ICD-10-CM | POA: Diagnosis present

## 2015-08-05 DIAGNOSIS — E870 Hyperosmolality and hypernatremia: Secondary | ICD-10-CM | POA: Diagnosis not present

## 2015-08-05 DIAGNOSIS — Y92019 Unspecified place in single-family (private) house as the place of occurrence of the external cause: Secondary | ICD-10-CM | POA: Diagnosis not present

## 2015-08-05 DIAGNOSIS — I5043 Acute on chronic combined systolic (congestive) and diastolic (congestive) heart failure: Secondary | ICD-10-CM

## 2015-08-05 DIAGNOSIS — Z86011 Personal history of benign neoplasm of the brain: Secondary | ICD-10-CM | POA: Diagnosis not present

## 2015-08-05 DIAGNOSIS — N179 Acute kidney failure, unspecified: Secondary | ICD-10-CM

## 2015-08-05 DIAGNOSIS — I248 Other forms of acute ischemic heart disease: Secondary | ICD-10-CM | POA: Diagnosis not present

## 2015-08-05 DIAGNOSIS — R0989 Other specified symptoms and signs involving the circulatory and respiratory systems: Secondary | ICD-10-CM | POA: Diagnosis present

## 2015-08-05 DIAGNOSIS — Z66 Do not resuscitate: Secondary | ICD-10-CM | POA: Diagnosis not present

## 2015-08-05 DIAGNOSIS — Z8249 Family history of ischemic heart disease and other diseases of the circulatory system: Secondary | ICD-10-CM | POA: Diagnosis not present

## 2015-08-05 DIAGNOSIS — I5033 Acute on chronic diastolic (congestive) heart failure: Secondary | ICD-10-CM | POA: Diagnosis not present

## 2015-08-05 DIAGNOSIS — I509 Heart failure, unspecified: Secondary | ICD-10-CM

## 2015-08-05 DIAGNOSIS — R400 Somnolence: Secondary | ICD-10-CM | POA: Diagnosis not present

## 2015-08-05 DIAGNOSIS — Z87891 Personal history of nicotine dependence: Secondary | ICD-10-CM | POA: Diagnosis not present

## 2015-08-05 DIAGNOSIS — Z9119 Patient's noncompliance with other medical treatment and regimen: Secondary | ICD-10-CM | POA: Diagnosis not present

## 2015-08-05 DIAGNOSIS — R410 Disorientation, unspecified: Secondary | ICD-10-CM | POA: Diagnosis present

## 2015-08-05 DIAGNOSIS — Z23 Encounter for immunization: Secondary | ICD-10-CM

## 2015-08-05 DIAGNOSIS — S80822A Blister (nonthermal), left lower leg, initial encounter: Secondary | ICD-10-CM | POA: Diagnosis present

## 2015-08-05 DIAGNOSIS — F0390 Unspecified dementia without behavioral disturbance: Secondary | ICD-10-CM | POA: Diagnosis present

## 2015-08-05 DIAGNOSIS — E875 Hyperkalemia: Secondary | ICD-10-CM | POA: Diagnosis not present

## 2015-08-05 DIAGNOSIS — Z9114 Patient's other noncompliance with medication regimen: Secondary | ICD-10-CM | POA: Diagnosis not present

## 2015-08-05 DIAGNOSIS — I130 Hypertensive heart and chronic kidney disease with heart failure and stage 1 through stage 4 chronic kidney disease, or unspecified chronic kidney disease: Principal | ICD-10-CM | POA: Diagnosis present

## 2015-08-05 DIAGNOSIS — R0600 Dyspnea, unspecified: Secondary | ICD-10-CM | POA: Diagnosis not present

## 2015-08-05 DIAGNOSIS — E785 Hyperlipidemia, unspecified: Secondary | ICD-10-CM | POA: Diagnosis present

## 2015-08-05 DIAGNOSIS — E139 Other specified diabetes mellitus without complications: Secondary | ICD-10-CM | POA: Diagnosis present

## 2015-08-05 DIAGNOSIS — R0602 Shortness of breath: Secondary | ICD-10-CM | POA: Diagnosis present

## 2015-08-05 DIAGNOSIS — G92 Toxic encephalopathy: Secondary | ICD-10-CM | POA: Diagnosis not present

## 2015-08-05 DIAGNOSIS — N183 Chronic kidney disease, stage 3 unspecified: Secondary | ICD-10-CM | POA: Diagnosis present

## 2015-08-05 DIAGNOSIS — E872 Acidosis: Secondary | ICD-10-CM | POA: Diagnosis present

## 2015-08-05 DIAGNOSIS — Z515 Encounter for palliative care: Secondary | ICD-10-CM | POA: Diagnosis not present

## 2015-08-05 DIAGNOSIS — D649 Anemia, unspecified: Secondary | ICD-10-CM | POA: Diagnosis present

## 2015-08-05 DIAGNOSIS — N184 Chronic kidney disease, stage 4 (severe): Secondary | ICD-10-CM | POA: Diagnosis not present

## 2015-08-05 DIAGNOSIS — J90 Pleural effusion, not elsewhere classified: Secondary | ICD-10-CM | POA: Diagnosis present

## 2015-08-05 DIAGNOSIS — N189 Chronic kidney disease, unspecified: Secondary | ICD-10-CM

## 2015-08-05 DIAGNOSIS — Z9181 History of falling: Secondary | ICD-10-CM | POA: Diagnosis not present

## 2015-08-05 DIAGNOSIS — I071 Rheumatic tricuspid insufficiency: Secondary | ICD-10-CM | POA: Diagnosis not present

## 2015-08-05 DIAGNOSIS — I10 Essential (primary) hypertension: Secondary | ICD-10-CM | POA: Diagnosis present

## 2015-08-05 DIAGNOSIS — D631 Anemia in chronic kidney disease: Secondary | ICD-10-CM | POA: Diagnosis not present

## 2015-08-05 DIAGNOSIS — I272 Other secondary pulmonary hypertension: Secondary | ICD-10-CM | POA: Diagnosis present

## 2015-08-05 DIAGNOSIS — J811 Chronic pulmonary edema: Secondary | ICD-10-CM

## 2015-08-05 DIAGNOSIS — I2510 Atherosclerotic heart disease of native coronary artery without angina pectoris: Secondary | ICD-10-CM | POA: Diagnosis not present

## 2015-08-05 DIAGNOSIS — E1159 Type 2 diabetes mellitus with other circulatory complications: Secondary | ICD-10-CM | POA: Diagnosis present

## 2015-08-05 DIAGNOSIS — E8729 Other acidosis: Secondary | ICD-10-CM | POA: Diagnosis present

## 2015-08-05 DIAGNOSIS — R778 Other specified abnormalities of plasma proteins: Secondary | ICD-10-CM

## 2015-08-05 DIAGNOSIS — E1122 Type 2 diabetes mellitus with diabetic chronic kidney disease: Secondary | ICD-10-CM | POA: Diagnosis present

## 2015-08-05 DIAGNOSIS — E1169 Type 2 diabetes mellitus with other specified complication: Secondary | ICD-10-CM | POA: Diagnosis present

## 2015-08-05 DIAGNOSIS — J9602 Acute respiratory failure with hypercapnia: Secondary | ICD-10-CM | POA: Diagnosis not present

## 2015-08-05 DIAGNOSIS — Z6836 Body mass index (BMI) 36.0-36.9, adult: Secondary | ICD-10-CM | POA: Diagnosis not present

## 2015-08-05 DIAGNOSIS — Z841 Family history of disorders of kidney and ureter: Secondary | ICD-10-CM

## 2015-08-05 LAB — CBC WITH DIFFERENTIAL/PLATELET
BASOS ABS: 0 10*3/uL (ref 0.0–0.1)
BASOS PCT: 0 %
EOS ABS: 0.4 10*3/uL (ref 0.0–0.7)
Eosinophils Relative: 7 %
HEMATOCRIT: 32.8 % — AB (ref 36.0–46.0)
HEMOGLOBIN: 9.7 g/dL — AB (ref 12.0–15.0)
Lymphocytes Relative: 16 %
Lymphs Abs: 0.8 10*3/uL (ref 0.7–4.0)
MCH: 26.1 pg (ref 26.0–34.0)
MCHC: 29.6 g/dL — ABNORMAL LOW (ref 30.0–36.0)
MCV: 88.4 fL (ref 78.0–100.0)
Monocytes Absolute: 0.5 10*3/uL (ref 0.1–1.0)
Monocytes Relative: 10 %
NEUTROS ABS: 3.5 10*3/uL (ref 1.7–7.7)
NEUTROS PCT: 67 %
Platelets: 217 10*3/uL (ref 150–400)
RBC: 3.71 MIL/uL — AB (ref 3.87–5.11)
RDW: 17.2 % — ABNORMAL HIGH (ref 11.5–15.5)
WBC: 5.2 10*3/uL (ref 4.0–10.5)

## 2015-08-05 LAB — URINALYSIS, ROUTINE W REFLEX MICROSCOPIC
BILIRUBIN URINE: NEGATIVE
Glucose, UA: NEGATIVE mg/dL
Hgb urine dipstick: NEGATIVE
Ketones, ur: NEGATIVE mg/dL
LEUKOCYTES UA: NEGATIVE
NITRITE: NEGATIVE
PH: 5 (ref 5.0–8.0)
Protein, ur: >300 mg/dL — AB
SPECIFIC GRAVITY, URINE: 1.02 (ref 1.005–1.030)

## 2015-08-05 LAB — POCT I-STAT 3, ART BLOOD GAS (G3+)
Acid-base deficit: 4 mmol/L — ABNORMAL HIGH (ref 0.0–2.0)
BICARBONATE: 25.1 meq/L — AB (ref 20.0–24.0)
O2 Saturation: 98 %
PCO2 ART: 69.3 mmHg — AB (ref 35.0–45.0)
PO2 ART: 132 mmHg — AB (ref 80.0–100.0)
Patient temperature: 98.6
TCO2: 27 mmol/L (ref 0–100)
pH, Arterial: 7.167 — CL (ref 7.350–7.450)

## 2015-08-05 LAB — URINE MICROSCOPIC-ADD ON

## 2015-08-05 LAB — I-STAT ARTERIAL BLOOD GAS, ED
Acid-base deficit: 3 mmol/L — ABNORMAL HIGH (ref 0.0–2.0)
Bicarbonate: 25.6 mEq/L — ABNORMAL HIGH (ref 20.0–24.0)
O2 Saturation: 97 %
PCO2 ART: 64.2 mmHg — AB (ref 35.0–45.0)
PO2 ART: 111 mmHg — AB (ref 80.0–100.0)
Patient temperature: 98.6
TCO2: 28 mmol/L (ref 0–100)
pH, Arterial: 7.209 — ABNORMAL LOW (ref 7.350–7.450)

## 2015-08-05 LAB — I-STAT TROPONIN, ED: TROPONIN I, POC: 0.1 ng/mL — AB (ref 0.00–0.08)

## 2015-08-05 LAB — LIPID PANEL
CHOLESTEROL: 223 mg/dL — AB (ref 0–200)
HDL: 50 mg/dL (ref >40)
LDL CALC: 149 mg/dL — AB (ref 0–99)
TRIGLYCERIDES: 120 mg/dL (ref <150)
Total CHOL/HDL Ratio: 4.5 RATIO
VLDL: 24 mg/dL (ref 0–40)

## 2015-08-05 LAB — BASIC METABOLIC PANEL
ANION GAP: 9 (ref 5–15)
BUN: 57 mg/dL — ABNORMAL HIGH (ref 6–20)
CHLORIDE: 111 mmol/L (ref 101–111)
CO2: 26 mmol/L (ref 22–32)
Calcium: 6.6 mg/dL — ABNORMAL LOW (ref 8.9–10.3)
Creatinine, Ser: 3.78 mg/dL — ABNORMAL HIGH (ref 0.44–1.00)
GFR calc non Af Amer: 11 mL/min — ABNORMAL LOW (ref >60)
GFR, EST AFRICAN AMERICAN: 13 mL/min — AB (ref >60)
Glucose, Bld: 112 mg/dL — ABNORMAL HIGH (ref 65–99)
Potassium: 5.1 mmol/L (ref 3.5–5.1)
Sodium: 146 mmol/L — ABNORMAL HIGH (ref 135–145)

## 2015-08-05 LAB — AMMONIA: Ammonia: 15 umol/L (ref 9–35)

## 2015-08-05 LAB — MRSA PCR SCREENING: MRSA BY PCR: NEGATIVE

## 2015-08-05 LAB — TROPONIN I: TROPONIN I: 0.1 ng/mL — AB (ref <0.031)

## 2015-08-05 LAB — CBG MONITORING, ED: Glucose-Capillary: 97 mg/dL (ref 65–99)

## 2015-08-05 LAB — BRAIN NATRIURETIC PEPTIDE: B NATRIURETIC PEPTIDE 5: 892.4 pg/mL — AB (ref 0.0–100.0)

## 2015-08-05 MED ORDER — ACETAMINOPHEN 325 MG PO TABS: 650.0000 mg | ORAL_TABLET | ORAL | Status: DC | PRN

## 2015-08-05 MED ORDER — MECLIZINE HCL 25 MG PO TABS: 12.5000 mg | ORAL_TABLET | Freq: Three times a day (TID) | ORAL | Status: DC | PRN

## 2015-08-05 MED ORDER — TIMOLOL MALEATE 0.5 % OP SOLN
1.0000 [drp] | Freq: Two times a day (BID) | OPHTHALMIC | Status: DC
  Administered 2015-08-06 – 2015-08-14 (×18): 1 [drp] via OPHTHALMIC
  Filled 2015-08-05 (×2): qty 5

## 2015-08-05 MED ORDER — INSULIN ASPART 100 UNIT/ML ~~LOC~~ SOLN: 0.0000 [IU] | Freq: Every day | SUBCUTANEOUS | Status: DC

## 2015-08-05 MED ORDER — INSULIN DETEMIR 100 UNIT/ML ~~LOC~~ SOLN: 7.0000 [IU] | Freq: Every day | SUBCUTANEOUS | Status: DC

## 2015-08-05 MED ORDER — FUROSEMIDE 20 MG PO TABS
80.0000 mg | ORAL_TABLET | Freq: Once | ORAL | Status: AC
  Administered 2015-08-05: 80 mg via ORAL
  Filled 2015-08-05: qty 4

## 2015-08-05 MED ORDER — SODIUM CHLORIDE 0.9 % IV SOLN: 250.0000 mL | INTRAVENOUS | Status: DC | PRN

## 2015-08-05 MED ORDER — BRIMONIDINE TARTRATE-TIMOLOL 0.2-0.5 % OP SOLN: 1.0000 [drp] | Freq: Two times a day (BID) | OPHTHALMIC | Status: DC

## 2015-08-05 MED ORDER — INSULIN ASPART 100 UNIT/ML ~~LOC~~ SOLN
0.0000 [IU] | Freq: Every day | SUBCUTANEOUS | Status: DC
  Administered 2015-08-13: 2 [IU] via SUBCUTANEOUS

## 2015-08-05 MED ORDER — ASPIRIN 81 MG PO CHEW: 324.0000 mg | CHEWABLE_TABLET | Freq: Once | ORAL | Status: DC

## 2015-08-05 MED ORDER — ASPIRIN 325 MG PO TABS
325.0000 mg | ORAL_TABLET | Freq: Every day | ORAL | Status: DC
Start: 2015-08-05 — End: 2015-08-14
  Administered 2015-08-07 – 2015-08-14 (×8): 325 mg via ORAL
  Filled 2015-08-05 (×8): qty 1

## 2015-08-05 MED ORDER — BRIMONIDINE TARTRATE 0.2 % OP SOLN
1.0000 [drp] | Freq: Two times a day (BID) | OPHTHALMIC | Status: DC
  Administered 2015-08-06 – 2015-08-14 (×18): 1 [drp] via OPHTHALMIC
  Filled 2015-08-05 (×2): qty 5

## 2015-08-05 MED ORDER — INSULIN ASPART 100 UNIT/ML ~~LOC~~ SOLN: 0.0000 [IU] | Freq: Three times a day (TID) | SUBCUTANEOUS | Status: DC

## 2015-08-05 MED ORDER — CARVEDILOL 12.5 MG PO TABS
12.5000 mg | ORAL_TABLET | Freq: Two times a day (BID) | ORAL | Status: DC
  Administered 2015-08-06 – 2015-08-14 (×16): 12.5 mg via ORAL
  Filled 2015-08-05 (×16): qty 1

## 2015-08-05 MED ORDER — SIMVASTATIN 20 MG PO TABS
20.0000 mg | ORAL_TABLET | Freq: Every day | ORAL | Status: DC
  Administered 2015-08-07: 20 mg via ORAL
  Filled 2015-08-05: qty 1

## 2015-08-05 MED ORDER — HYDRALAZINE HCL 50 MG PO TABS
50.0000 mg | ORAL_TABLET | Freq: Three times a day (TID) | ORAL | Status: DC
  Administered 2015-08-06 – 2015-08-14 (×24): 50 mg via ORAL
  Filled 2015-08-05 (×25): qty 1

## 2015-08-05 MED ORDER — HEPARIN SODIUM (PORCINE) 5000 UNIT/ML IJ SOLN
5000.0000 [IU] | Freq: Three times a day (TID) | INTRAMUSCULAR | Status: DC
  Administered 2015-08-06 – 2015-08-14 (×27): 5000 [IU] via SUBCUTANEOUS
  Filled 2015-08-05 (×26): qty 1

## 2015-08-05 MED ORDER — INSULIN DETEMIR 100 UNIT/ML ~~LOC~~ SOLN
7.0000 [IU] | Freq: Every day | SUBCUTANEOUS | Status: DC
  Administered 2015-08-06 – 2015-08-13 (×9): 7 [IU] via SUBCUTANEOUS
  Filled 2015-08-05 (×11): qty 0.07

## 2015-08-05 MED ORDER — SODIUM CHLORIDE 0.9 % IJ SOLN: 3.0000 mL | INTRAMUSCULAR | Status: DC | PRN

## 2015-08-05 MED ORDER — FUROSEMIDE 10 MG/ML IJ SOLN: 40.0000 mg | Freq: Once | INTRAMUSCULAR | Status: DC

## 2015-08-05 MED ORDER — INSULIN ASPART 100 UNIT/ML ~~LOC~~ SOLN
0.0000 [IU] | Freq: Three times a day (TID) | SUBCUTANEOUS | Status: DC
  Administered 2015-08-07 (×2): 4 [IU] via SUBCUTANEOUS
  Administered 2015-08-08: 7 [IU] via SUBCUTANEOUS
  Administered 2015-08-08: 4 [IU] via SUBCUTANEOUS
  Administered 2015-08-09: 7 [IU] via SUBCUTANEOUS
  Administered 2015-08-09 – 2015-08-10 (×2): 4 [IU] via SUBCUTANEOUS
  Administered 2015-08-11 (×2): 3 [IU] via SUBCUTANEOUS
  Administered 2015-08-12 – 2015-08-14 (×3): 4 [IU] via SUBCUTANEOUS

## 2015-08-05 MED ORDER — ONDANSETRON HCL 4 MG/2ML IJ SOLN: 4.0000 mg | Freq: Four times a day (QID) | INTRAMUSCULAR | Status: DC | PRN

## 2015-08-05 MED ORDER — SODIUM CHLORIDE 0.9 % IJ SOLN
3.0000 mL | Freq: Two times a day (BID) | INTRAMUSCULAR | Status: DC
  Administered 2015-08-06 – 2015-08-10 (×8): 3 mL via INTRAVENOUS

## 2015-08-05 MED ORDER — ISOSORBIDE MONONITRATE ER 30 MG PO TB24
30.0000 mg | ORAL_TABLET | Freq: Every day | ORAL | Status: DC
  Administered 2015-08-07 – 2015-08-14 (×8): 30 mg via ORAL
  Filled 2015-08-05 (×8): qty 1

## 2015-08-05 NOTE — Progress Notes (Signed)
Patient transported to [id number] without complications. Vital signs stable. RT will continue to monitor.

## 2015-08-05 NOTE — ED Provider Notes (Signed)
CSN: [id number]     Arrival date & time 08/05/15  1023 History   First MD Initiated Contact with Patient 08/05/15 1029     Chief Complaint  Patient presents with  . Shortness of Breath     (Consider location/radiation/quality/duration/timing/severity/associated sxs/prior Treatment) Patient is a 69 y.o. female presenting with shortness of breath. The history is provided by the patient.  Shortness of Breath Severity:  Moderate Onset quality:  Gradual Duration:  2 weeks Timing:  Constant Progression:  Worsening Chronicity:  New Context: activity   Relieved by:  Nothing Worsened by:  Nothing tried Ineffective treatments:  None tried Associated symptoms: PND   Associated symptoms: no abdominal pain, no chest pain, no cough, no fever and no sputum production     Past Medical History  Diagnosis Date  . Diabetes mellitus without complication ([place])   . CKD (chronic kidney disease), stage III     a. Borderline III-IV.  [name] Hypertension   . CAD (coronary artery disease)     a. Cath 01/2014: mild in LAD, mild-mod LCx, mod-severe RCA - for med rx.  . Systolic CHF ([place])     a. 01/2014: dx with mixed ICM/NICM (out of proportion to CAD) EF 25-30% by echo.   . Hyperlipidemia   . Anemia     a. 01/2014: suspected of chronic disease, negative FOBT.  [name] Meningioma ([place])     a. Incidental dx 01/2014.  . Obesity   . History of echocardiogram     Echo (10/15):  Mod LVH, EF 50-55%, Gr 1 DD, MAC, mild MR, mild TR, PASP 35 mmHg   Past Surgical History  Procedure Laterality Date  . Abdominal hysterectomy    . Breast surgery    . Tonsillectomy    . Left heart catheterization with coronary angiogram N/A 01/27/2014    Procedure: LEFT HEART CATHETERIZATION WITH CORONARY ANGIOGRAM;  Surgeon: Jettie Booze, MD;  Location: [facility] CATH LAB;  Service: Cardiovascular;  Laterality: N/A;   Family History  Problem Relation Age of Onset  . Diabetic kidney disease Mother   . Hypertension Mother   .  Hypertension Father   . Heart failure Mother   . Heart attack Mother   . Hyperlipidemia Mother    Social History  Substance Use Topics  . Smoking status: Former Smoker -- 0.25 packs/day for 30 years    Types: Cigarettes  . Smokeless tobacco: Never Used  . Alcohol Use: No   OB History    No data available     Review of Systems  Constitutional: Negative for fever.  Respiratory: Positive for shortness of breath. Negative for cough and sputum production.   Cardiovascular: Positive for PND. Negative for chest pain.  Gastrointestinal: Negative for abdominal pain.  All other systems reviewed and are negative.     Allergies  Review of patient's allergies indicates no known allergies.  Home Medications   Prior to Admission medications   Medication Sig Start Date End Date Taking? Authorizing Provider  aspirin 81 MG EC tablet Take 1 tablet (81 mg total) by mouth daily. 01/30/14  Yes Verlee Monte, MD  brimonidine-timolol (COMBIGAN) 0.2-0.5 % ophthalmic solution Place 1 drop into the right eye 2 (two) times daily as needed.    Yes Historical Provider, MD  carvedilol (COREG) 12.5 MG tablet Take 12.5 mg by mouth 2 (two) times daily with a meal.  09/30/14  Yes Historical Provider, MD  furosemide (LASIX) 40 MG tablet Take 40 mg by mouth daily as needed  for fluid or edema.    Yes Historical Provider, MD  hydrALAZINE (APRESOLINE) 50 MG tablet TAKE 1 TABLET (50 MG TOTAL) BY MOUTH 3 (THREE) TIMES DAILY. 07/28/15  Yes Josue Hector, MD  insulin aspart (NOVOLOG) 100 UNIT/ML injection Inject 0-9 Units into the skin 3 (three) times daily with meals. CBG < 70: implement hypoglycemia protocol CBG 70 - 120: 0 units CBG 121 - 150: 1 unit CBG 151 - 200: 2 units CBG 201 - 250: 3 units CBG 251 - 300: 5 units CBG 301 - 350: 7 units CBG 351 - 400: 9 units CBG > 400: call MD. Patient taking differently: Inject 6 Units into the skin 3 (three) times daily with meals. CBG < 70: implement hypoglycemia  protocol CBG 70 - 120: 0 units CBG 121 - 150: 1 unit CBG 151 - 200: 2 units CBG 201 - 250: 3 units CBG 251 - 300: 5 units CBG 301 - 350: 7 units CBG 351 - 400: 9 units CBG > 400: call MD. 06/12/14  Yes Modena Jansky, MD  insulin detemir (LEVEMIR) 100 UNIT/ML injection Inject 0.1 mLs (10 Units total) into the skin at bedtime. 06/12/14  Yes Modena Jansky, MD  isosorbide mononitrate (IMDUR) 30 MG 24 hr tablet TAKE 1 TABLET (30 MG TOTAL) BY MOUTH DAILY. 11/07/14  Yes Josue Hector, MD  simvastatin (ZOCOR) 20 MG tablet Take 1 tablet (20 mg total) by mouth daily at 6 PM. 03/17/14  Yes Scott T Kathlen Mody, PA-C  acetaminophen (TYLENOL) 325 MG tablet Take 2 tablets (650 mg total) by mouth every 6 (six) hours as needed for mild pain, moderate pain or headache (or Fever >/= 101). Patient not taking: Reported on 05/07/2015 06/12/14   Modena Jansky, MD  cephALEXin (KEFLEX) 500 MG capsule Take 1 capsule (500 mg total) by mouth 4 (four) times daily. Patient not taking: Reported on 08/05/2015 06/20/15   Deno Etienne, DO  clindamycin (CLEOCIN) 300 MG capsule Take 1 capsule (300 mg total) by mouth 4 (four) times daily. X 7 days Patient not taking: Reported on 06/20/2015 05/07/15   Wandra Arthurs, MD  isosorbide mononitrate (IMDUR) 30 MG 24 hr tablet TAKE 1 TABLET (30 MG TOTAL) BY MOUTH DAILY. Patient not taking: Reported on 06/20/2015 06/10/15   Josue Hector, MD  meclizine (ANTIVERT) 12.5 MG tablet Take 1 tablet (12.5 mg total) by mouth 3 (three) times daily as needed for dizziness or nausea. Patient taking differently: Take 12.5 mg by mouth 3 (three) times daily as needed for dizziness or nausea. For dizzy spells 01/30/14   Verlee Monte, MD  sulfamethoxazole-trimethoprim (BACTRIM DS,SEPTRA DS) 800-160 MG tablet Take 1 tablet by mouth 2 (two) times daily. Patient not taking: Reported on 08/05/2015 06/20/15   Deno Etienne, DO   There were no vitals taken for this visit. Physical Exam  Constitutional: She is oriented to  person, place, and time. She appears well-developed and well-nourished. No distress.  HENT:  Head: Normocephalic.  Eyes: Conjunctivae are normal.  Neck: Neck supple. No tracheal deviation present.  Cardiovascular: Normal rate, regular rhythm and normal heart sounds.   Pulmonary/Chest: Effort normal. No respiratory distress. She has rales (bilateral). She exhibits no tenderness.  Abdominal: Soft. She exhibits no distension. There is no tenderness.  Neurological: She is alert and oriented to person, place, and time.  Skin: Skin is warm and dry.  Psychiatric: She has a normal mood and affect.    ED Course  Procedures (including critical  care time) Labs Review Labs Reviewed  BASIC METABOLIC PANEL - Abnormal; Notable for the following:    Sodium 146 (*)    Glucose, Bld 112 (*)    BUN 57 (*)    Creatinine, Ser 3.78 (*)    Calcium 6.6 (*)    GFR calc non Af Amer 11 (*)    GFR calc Af Amer 13 (*)    All other components within normal limits  CBC WITH DIFFERENTIAL/PLATELET - Abnormal; Notable for the following:    RBC 3.71 (*)    Hemoglobin 9.7 (*)    HCT 32.8 (*)    MCHC 29.6 (*)    RDW 17.2 (*)    All other components within normal limits  BRAIN NATRIURETIC PEPTIDE - Abnormal; Notable for the following:    B Natriuretic Peptide 892.4 (*)    All other components within normal limits  URINALYSIS, ROUTINE W REFLEX MICROSCOPIC (NOT AT [facility]) - Abnormal; Notable for the following:    APPearance HAZY (*)    Protein, ur >300 (*)    All other components within normal limits  URINE MICROSCOPIC-ADD ON - Abnormal; Notable for the following:    Squamous Epithelial / LPF 0-5 (*)    Bacteria, UA RARE (*)    Casts HYALINE CASTS (*)    All other components within normal limits  I-STAT TROPOININ, ED - Abnormal; Notable for the following:    Troponin i, poc 0.10 (*)    All other components within normal limits  I-STAT ARTERIAL BLOOD GAS, ED - Abnormal; Notable for the following:    pH,  Arterial 7.209 (*)    pCO2 arterial 64.2 (*)    pO2, Arterial 111.0 (*)    Bicarbonate 25.6 (*)    Acid-base deficit 3.0 (*)    All other components within normal limits  MRSA PCR SCREENING  AMMONIA  AMMONIA  CBG MONITORING, ED    Imaging Review Dg Chest 2 View  08/05/2015  CLINICAL DATA:  Shortness of breath, worsening over last month. Dry cough. EXAM: CHEST  2 VIEW COMPARISON:  06/10/2014 FINDINGS: Cardiomegaly with vascular congestion. No overt edema. No confluent opacities. Small bilateral effusions on the lateral view. No acute bony abnormality. IMPRESSION: Cardiomegaly with vascular congestion and small effusions. Electronically Signed   By: Rolm Baptise M.D.   On: 08/05/2015 11:52   I have personally reviewed and evaluated these images and lab results as part of my medical decision-making.   EKG Interpretation   Date/Time:  Wednesday August 05 2015 10:25:11 EST Ventricular Rate:  70 PR Interval:  148 QRS Duration: 87 QT Interval:  457 QTC Calculation: 493 R Axis:   -14 Text Interpretation:  Sinus rhythm LVH with secondary repolarization  abnormality No significant change since last tracing Confirmed by Benedetto Ryder  MD, Lutisha Knoche [id number]) on 08/05/2015 11:10:14 AM      MDM   Final diagnoses:  Acute on chronic combined systolic and diastolic congestive heart failure (HCC)  AKI (acute kidney injury) (HCC)  Elevated troponin I level    69 y.o. female presents with increased shortness of breath progressively over last 2 weeks. Slumped to floor and couldn't get up today prompting call to EMS. New oxygen requirement here. Interstitial edema noted on XR, BNP and troponin elevated suggestive of heart failure exacerbation without ischemic changes on EKG. Cardiology consulted and recommending medical admission with apparent AKI. Hospitalist was consulted for admission and will see the patient in the emergency department.     Leo Grosser,  MD 08/05/15 2146

## 2015-08-05 NOTE — H&P (Signed)
[facility] History and Physical  Terria ALIVIANA EWY [id number] DOB: [birth year] DOA: 08/05/2015  Referring physician: Laneta Simmers PCP: Benito Mccreedy, MD   Chief Complaint: sob  HPI: Kathleen [name] is a very pleasant 69 y.o. female with past medical history that includes hypertension, diabetes, hyperlipidemia, chronic kidney disease, carotid artery disease, chronic combined heart failure resents to the emergency department with chief complaint of gradual worsening shortness of breath, increased lower extremity edema and worsening orthopnea. Initial evaluation reveals acute on chronic combined heart failure.  Information is obtained from the patient and the daughter who is at the bedside. Patient reports a gradual worsening shortness of breath of the last 4 weeks. Should symptoms include sleep lower extremity edema and orthopnea. She reports that she has Lasix on her home medication list and she took some yesterday and the day before but she does not take daily. She denies chest pain palpitations headache dizziness syncope or near-syncope. She denies abdominal pain nausea vomiting. She denies dysuria hematuria ferequency or urgency.  In the emergency department she is afebrile hemodynamically stable oxygen saturation level 90% on room air. She is placed on 2 L nasal cannula.  Review of Systems:  10 point review of systems complete and all systems are negative except as indicated in the history of present illness  Past Medical History  Diagnosis Date  . Diabetes mellitus without complication ([place])   . CKD (chronic kidney disease), stage III     a. Borderline III-IV.  [name] Hypertension   . CAD (coronary artery disease)     a. Cath 01/2014: mild in LAD, mild-mod LCx, mod-severe RCA - for med rx.  . Systolic CHF ([place])     a. 01/2014: dx with mixed ICM/NICM (out of proportion to CAD) EF 25-30% by echo.   . Hyperlipidemia   . Anemia     a. 01/2014: suspected of chronic disease, negative  FOBT.  [name] Meningioma ([place])     a. Incidental dx 01/2014.  . Obesity   . History of echocardiogram     Echo (10/15):  Mod LVH, EF 50-55%, Gr 1 DD, MAC, mild MR, mild TR, PASP 35 mmHg   Past Surgical History  Procedure Laterality Date  . Abdominal hysterectomy    . Breast surgery    . Tonsillectomy    . Left heart catheterization with coronary angiogram N/A 01/27/2014    Procedure: LEFT HEART CATHETERIZATION WITH CORONARY ANGIOGRAM;  Surgeon: Jettie Booze, MD;  Location: [facility] CATH LAB;  Service: Cardiovascular;  Laterality: N/A;   Social History:  reports that she has quit smoking. Her smoking use included Cigarettes. She has a 7.5 pack-year smoking history. She has never used smokeless tobacco. She reports that she does not drink alcohol or use illicit drugs. She lives at home with her daughter uses a walker for ambulation no recent falls fairly independent with ADLs No Known Allergies  Family History  Problem Relation Age of Onset  . Diabetic kidney disease Mother   . Hypertension Mother   . Hypertension Father   . Heart failure Mother   . Heart attack Mother   . Hyperlipidemia Mother      Prior to Admission medications   Medication Sig Start Date End Date Taking? Authorizing Provider  aspirin 81 MG EC tablet Take 1 tablet (81 mg total) by mouth daily. 01/30/14  Yes Verlee Monte, MD  brimonidine-timolol (COMBIGAN) 0.2-0.5 % ophthalmic solution Place 1 drop into the right eye 2 (two) times daily as needed.  Yes Historical Provider, MD  carvedilol (COREG) 12.5 MG tablet Take 12.5 mg by mouth 2 (two) times daily with a meal.  09/30/14  Yes Historical Provider, MD  furosemide (LASIX) 40 MG tablet Take 40 mg by mouth daily as needed for fluid or edema.    Yes Historical Provider, MD  hydrALAZINE (APRESOLINE) 50 MG tablet TAKE 1 TABLET (50 MG TOTAL) BY MOUTH 3 (THREE) TIMES DAILY. 07/28/15  Yes Josue Hector, MD  insulin aspart (NOVOLOG) 100 UNIT/ML injection Inject 0-9 Units into  the skin 3 (three) times daily with meals. CBG < 70: implement hypoglycemia protocol CBG 70 - 120: 0 units CBG 121 - 150: 1 unit CBG 151 - 200: 2 units CBG 201 - 250: 3 units CBG 251 - 300: 5 units CBG 301 - 350: 7 units CBG 351 - 400: 9 units CBG > 400: call MD. Patient taking differently: Inject 6 Units into the skin 3 (three) times daily with meals. CBG < 70: implement hypoglycemia protocol CBG 70 - 120: 0 units CBG 121 - 150: 1 unit CBG 151 - 200: 2 units CBG 201 - 250: 3 units CBG 251 - 300: 5 units CBG 301 - 350: 7 units CBG 351 - 400: 9 units CBG > 400: call MD. 06/12/14  Yes Modena Jansky, MD  insulin detemir (LEVEMIR) 100 UNIT/ML injection Inject 0.1 mLs (10 Units total) into the skin at bedtime. 06/12/14  Yes Modena Jansky, MD  isosorbide mononitrate (IMDUR) 30 MG 24 hr tablet TAKE 1 TABLET (30 MG TOTAL) BY MOUTH DAILY. 11/07/14  Yes Josue Hector, MD  simvastatin (ZOCOR) 20 MG tablet Take 1 tablet (20 mg total) by mouth daily at 6 PM. 03/17/14  Yes Scott T Kathlen Mody, PA-C  acetaminophen (TYLENOL) 325 MG tablet Take 2 tablets (650 mg total) by mouth every 6 (six) hours as needed for mild pain, moderate pain or headache (or Fever >/= 101). Patient not taking: Reported on 05/07/2015 06/12/14   Modena Jansky, MD  clindamycin (CLEOCIN) 300 MG capsule Take 1 capsule (300 mg total) by mouth 4 (four) times daily. X 7 days Patient not taking: Reported on 06/20/2015 05/07/15   Wandra Arthurs, MD  isosorbide mononitrate (IMDUR) 30 MG 24 hr tablet TAKE 1 TABLET (30 MG TOTAL) BY MOUTH DAILY. Patient not taking: Reported on 06/20/2015 06/10/15   Josue Hector, MD  meclizine (ANTIVERT) 12.5 MG tablet Take 1 tablet (12.5 mg total) by mouth 3 (three) times daily as needed for dizziness or nausea. Patient taking differently: Take 12.5 mg by mouth 3 (three) times daily as needed for dizziness or nausea. For dizzy spells 01/30/14   Verlee Monte, MD   Physical Exam: Filed Vitals:   08/05/15 1300  08/05/15 1324 08/05/15 1330 08/05/15 1338  BP: 121/52  134/59 134/59  Pulse: 73 77 74 71  Resp: 16 18 17 18   SpO2: 100%  100% 100%    Wt Readings from Last 3 Encounters:  05/07/15 90.719 kg (200 lb)  11/20/14 87 kg (191 lb 12.8 oz)  06/13/14 86.41 kg (190 lb 8 oz)    General:  Appears calm and comfortable, obese Eyes: PERRL, normal lids, irises & conjunctiva ENT: grossly normal hearing, lips & tongue, his membranes of her mouth are pink moist Neck: no LAD, masses or thyromegaly Cardiovascular: RRR, no m/r/g. To 3+ lower extremity edema Telemetry: SR, no arrhythmias  Respiratory: Normal respiratory effort somewhat shallow fine crackles bilateral bases no wheeze Abdomen: soft,  ntnd the soft positive bowel sounds Skin: no rash or induration seen on limited exam Musculoskeletal: grossly normal tone BUE/BLE Psychiatric: grossly normal mood and affect, speech fluent and appropriate Neurologic: grossly non-focal.          Labs on Admission:  Basic Metabolic Panel:  Recent Labs Lab 08/05/15 1130  NA 146*  K 5.1  CL 111  CO2 26  GLUCOSE 112*  BUN 57*  CREATININE 3.78*  CALCIUM 6.6*   Liver Function Tests: No results for input(s): AST, ALT, ALKPHOS, BILITOT, PROT, ALBUMIN in the last 168 hours. No results for input(s): LIPASE, AMYLASE in the last 168 hours. No results for input(s): AMMONIA in the last 168 hours. CBC:  Recent Labs Lab 08/05/15 1130  WBC 5.2  NEUTROABS 3.5  HGB 9.7*  HCT 32.8*  MCV 88.4  PLT 217   Cardiac Enzymes: No results for input(s): CKTOTAL, CKMB, CKMBINDEX, TROPONINI in the last 168 hours.  BNP (last 3 results)  Recent Labs  08/05/15 1130  BNP 892.4*    ProBNP (last 3 results) No results for input(s): PROBNP in the last 8760 hours.  CBG: No results for input(s): GLUCAP in the last 168 hours.  Radiological Exams on Admission: Dg Chest 2 View  08/05/2015  CLINICAL DATA:  Shortness of breath, worsening over last month. Dry cough.  EXAM: CHEST  2 VIEW COMPARISON:  06/10/2014 FINDINGS: Cardiomegaly with vascular congestion. No overt edema. No confluent opacities. Small bilateral effusions on the lateral view. No acute bony abnormality. IMPRESSION: Cardiomegaly with vascular congestion and small effusions. Electronically Signed   By: Rolm Baptise M.D.   On: 08/05/2015 11:52    EKG: Independently reviewed. Sinus rhythm LVH with secondary repolarization abnormality No significant change since last tracing  Assessment/Plan Principal Problem:   CHF, acute on chronic (HCC) Active Problems:   Diabetes mellitus due to abnormal insulin (HCC)   Hyperlipidemia   CAD (coronary artery disease)   Obesity   Anemia   Essential hypertension   Acute renal failure superimposed on stage 3 chronic kidney disease (HCC)   Elevated troponin  1. Acute on chronic diastolic heart failure. Etiology uncertain. Ports compliance with medications that include Lasix when necessary she has taken the last 2 days. Echo 2015 with an EF of 25%. -Admit to telemetry -Provide IV Lasix 80 mg twice a day -Monitor intake and output -Obtain daily weights -Repeat echo -Cardiology consult  2. Elevated troponin. Likely demand. Patient denies chest pain palpitations. EKG without acute changes. -Admit to telemetry -Cycle cardiac enzymes -Repeat EKG in the a.m. -Aspirin -Lipid  #3. Diabetes. Fair control. Home medications include Levemir -Continue Levemir at -Riding scale for optimal control  #4. Hypertension. There are control in the emergency department -Lasix as noted above -10 you home meds  Acute on chronic renal failure. Chart review indicates creatinine baseline is between 2.2 and 2.5. Currently 3.78. Related to above -#1 -Monitor urine output -Lasix as noted above -Recheck in the morning  6. CAD/hyperlipidemia. Denies chest pain. EKG as noted above. 2015 studies include LHC,  -She does have an elevated troponin as noted  above -Aspirin -Lipid panel -Cardiology consult  #7. Anemia. Likely of chronic disease -Appears stable. -Monitor  cardilogy  Code Status: full DVT Prophylaxis: Family Communication: daughter  Disposition Plan: home when   Time spent: 85 minutes  [facility]

## 2015-08-05 NOTE — ED Notes (Signed)
Pt growing more somnolent. NP Dyanne Carrel paged.

## 2015-08-05 NOTE — Consult Note (Signed)
CARDIOLOGY CONSULT NOTE   Patient ID: Kathleen [name] MRN: [id number] DOB/AGE: 70-Nov-1947 70 y.o.  Admit date: 08/05/2015  Consulting Physician: Dr. Laneta Simmers.  Primary Physician   Benito Mccreedy, MD Primary Cardiologist   Dr. Johnsie Cancel  Reason for Consultation   CHF + troponin.   HPI: Kathleen [name] is a 69 y.o. female with a history of HTN, DM, HLD, CKD (06/12/14 Baseline Cr 2.2 ), carotid artery disease, tobacco abuse (quit 3 months ago), dementia and chronic mixed S/D CHF (EF recently improved) who presented to [facility] ED today after a mechanical fall. However, it appears she is in an acute CHF exacerbation and being admitted for further w/up and management.   She was admitted 6/16-6/25/2015 with vertigo symptoms in setting of elevated BP. Brain MRI with incidental finding of a 3.5 x 2.8 cm meningioma. Echocardiogram demonstrated new cardiomyopathy with an EF of 25-30%. Myoview was abnormal with anterior and lateral ischemia as well as inferior scar with minimal peri-infarct ischemia. LHC demonstrated moderate to severe mid RCA stenosis and no significant CAD in the circumflex or LAD. Medical therapy was favored. ARB had been held to reduce the risk of contrast-induced nephropathy. Dr Roswell Miners saw her in follow up 02/12/14. Follow up lab work demonstrated that creatinine remained slightly elevated. He decided to hold off on resuming her ARB.    Admitted 06/2014 for syncope. Repeat 2D ECHO 05/2014 showed EF improved to 50-55% w/ mod LVH, G1DD, mild MR, mild TR. PA pk pressure 35. Felt to not be of cardiac etiology. QT on longish side but less than 500 msec and no torsades or VT in hospital ~48 hrs. Avoid QT prolonging drugs. She was continued on hydralazine + nitrates as she is not a candidate for ACE/ARB with CKD.   He was last seen by Dr. Johnsie Cancel in clinic in 11/2014. Hydralazine increased to 50mg  TID.  She presented to the [facility] ED today after a mechanical fall in her living room. She denies  LOC. She also complained of SOB, orthopnea and PND for the past 3 weeks. She denies chest pain. She is a poor historian and no family is present in the room.    Studies:  - LHC (01/27/14): Mid RCA 70% then 50%. Medical therapy favored.  - Echo (01/22/14): Moderate LVH, EF 25-30%, diffuse HK (no RWMA), grade 1 diastolic dysfunction, normal RV function,  - Nuclear (01/23/14): Inferior scar with minimal peri-infarct ischemia, anterior and lateral ischemia, EF 41%  - Carotid US (01/22/14): Bilateral ICA 1-39% by end diast velocities/plaque morphology/ICA-CCA ratio (123456 by peak systolic velocity)    Past Medical History  Diagnosis Date  . Diabetes mellitus without complication ([place])   . CKD (chronic kidney disease), stage III     a. Borderline III-IV.  [name] Hypertension   . CAD (coronary artery disease)     a. Cath 01/2014: mild in LAD, mild-mod LCx, mod-severe RCA - for med rx.  . Systolic CHF ([place])     a. 01/2014: dx with mixed ICM/NICM (out of proportion to CAD) EF 25-30% by echo.   . Hyperlipidemia   . Anemia     a. 01/2014: suspected of chronic disease, negative FOBT.  [name] Meningioma ([place])     a. Incidental dx 01/2014.  . Obesity   . History of echocardiogram     Echo (10/15):  Mod LVH, EF 50-55%, Gr 1 DD, MAC, mild MR, mild TR, PASP 35 mmHg     Past Surgical History  Procedure  Laterality Date  . Abdominal hysterectomy    . Breast surgery    . Tonsillectomy    . Left heart catheterization with coronary angiogram N/A 01/27/2014    Procedure: LEFT HEART CATHETERIZATION WITH CORONARY ANGIOGRAM;  Surgeon: Jettie Booze, MD;  Location: [facility] CATH LAB;  Service: Cardiovascular;  Laterality: N/A;    No Known Allergies  I have reviewed the patient's current medications     Prior to Admission medications   Medication Sig Start Date End Date Taking? Authorizing Provider  aspirin 81 MG EC tablet Take 1 tablet (81 mg total) by mouth daily. 01/30/14  Yes Verlee Monte, MD    brimonidine-timolol (COMBIGAN) 0.2-0.5 % ophthalmic solution Place 1 drop into the right eye 2 (two) times daily as needed.    Yes Historical Provider, MD  carvedilol (COREG) 12.5 MG tablet Take 12.5 mg by mouth 2 (two) times daily with a meal.  09/30/14  Yes Historical Provider, MD  furosemide (LASIX) 40 MG tablet Take 40 mg by mouth daily as needed for fluid or edema.    Yes Historical Provider, MD  hydrALAZINE (APRESOLINE) 50 MG tablet TAKE 1 TABLET (50 MG TOTAL) BY MOUTH 3 (THREE) TIMES DAILY. 07/28/15  Yes Josue Hector, MD  insulin aspart (NOVOLOG) 100 UNIT/ML injection Inject 0-9 Units into the skin 3 (three) times daily with meals. CBG < 70: implement hypoglycemia protocol CBG 70 - 120: 0 units CBG 121 - 150: 1 unit CBG 151 - 200: 2 units CBG 201 - 250: 3 units CBG 251 - 300: 5 units CBG 301 - 350: 7 units CBG 351 - 400: 9 units CBG > 400: call MD. Patient taking differently: Inject 6 Units into the skin 3 (three) times daily with meals. CBG < 70: implement hypoglycemia protocol CBG 70 - 120: 0 units CBG 121 - 150: 1 unit CBG 151 - 200: 2 units CBG 201 - 250: 3 units CBG 251 - 300: 5 units CBG 301 - 350: 7 units CBG 351 - 400: 9 units CBG > 400: call MD. 06/12/14  Yes Modena Jansky, MD  insulin detemir (LEVEMIR) 100 UNIT/ML injection Inject 0.1 mLs (10 Units total) into the skin at bedtime. 06/12/14  Yes Modena Jansky, MD  isosorbide mononitrate (IMDUR) 30 MG 24 hr tablet TAKE 1 TABLET (30 MG TOTAL) BY MOUTH DAILY. 11/07/14  Yes Josue Hector, MD  simvastatin (ZOCOR) 20 MG tablet Take 1 tablet (20 mg total) by mouth daily at 6 PM. 03/17/14  Yes Scott T Kathlen Mody, PA-C  acetaminophen (TYLENOL) 325 MG tablet Take 2 tablets (650 mg total) by mouth every 6 (six) hours as needed for mild pain, moderate pain or headache (or Fever >/= 101). Patient not taking: Reported on 05/07/2015 06/12/14   Modena Jansky, MD  cephALEXin (KEFLEX) 500 MG capsule Take 1 capsule (500 mg total) by mouth 4  (four) times daily. Patient not taking: Reported on 08/05/2015 06/20/15   Deno Etienne, DO  clindamycin (CLEOCIN) 300 MG capsule Take 1 capsule (300 mg total) by mouth 4 (four) times daily. X 7 days Patient not taking: Reported on 06/20/2015 05/07/15   Wandra Arthurs, MD  isosorbide mononitrate (IMDUR) 30 MG 24 hr tablet TAKE 1 TABLET (30 MG TOTAL) BY MOUTH DAILY. Patient not taking: Reported on 06/20/2015 06/10/15   Josue Hector, MD  meclizine (ANTIVERT) 12.5 MG tablet Take 1 tablet (12.5 mg total) by mouth 3 (three) times daily as needed for dizziness or nausea.  Patient taking differently: Take 12.5 mg by mouth 3 (three) times daily as needed for dizziness or nausea. For dizzy spells 01/30/14   Verlee Monte, MD  sulfamethoxazole-trimethoprim (BACTRIM DS,SEPTRA DS) 800-160 MG tablet Take 1 tablet by mouth 2 (two) times daily. Patient not taking: Reported on 08/05/2015 06/20/15   Deno Etienne, DO     Social History   Social History  . Marital Status: Divorced    Spouse Name: N/A  . Number of Children: N/A  . Years of Education: N/A   Occupational History  . Not on file.   Social History Main Topics  . Smoking status: Former Smoker -- 0.25 packs/day for 30 years    Types: Cigarettes  . Smokeless tobacco: Never Used  . Alcohol Use: No  . Drug Use: No  . Sexual Activity: No   Other Topics Concern  . Not on file   Social History Narrative    Family Status  Relation Status Death Age  . Mother Deceased   . Father Deceased    Family History  Problem Relation Age of Onset  . Diabetic kidney disease Mother   . Hypertension Mother   . Hypertension Father   . Heart failure Mother   . Heart attack Mother   . Hyperlipidemia Mother      ROS:  Full 14 point review of systems complete and found to be negative unless listed above.  Physical Exam: Blood pressure 144/61, pulse 77, resp. rate 18, SpO2 100 %.  General: Well developed, well nourished, female in no acute distress Head: Eyes  PERRLA, No xanthomas.   Normocephalic and atraumatic, oropharynx without edema or exudate. Lungs: crackles at bases Consolidation at right base  Heart: HRRR S1 S2, no rub/gallop, Heart regular rate and rhythm with S1, S2  murmur. pulses are 2+ extrem.   Neck: No carotid bruits. No lymphadenopathy. +  JVD. Abdomen: Bowel sounds present, abdomen soft and non-tender without masses or hernias noted. Msk:  No spine or cva tenderness. No weakness, no joint deformities or effusions. Extremities: No clubbing or cyanosis.  1+ LE edema.  Neuro: Alert and oriented X 3. No focal deficits noted. Psych:  Good affect, responds appropriately Skin: No rashes or lesions noted.  Labs:  Lab Results  Component Value Date   WBC 5.2 08/05/2015   HGB 9.7* 08/05/2015   HCT 32.8* 08/05/2015   MCV 88.4 08/05/2015   PLT 217 08/05/2015   No results for input(s): INR in the last 72 hours.  Recent Labs Lab 08/05/15 1130  NA 146*  K 5.1  CL 111  CO2 26  BUN 57*  CREATININE 3.78*  CALCIUM 6.6*  GLUCOSE 112*   No results found for: MG No results for input(s): CKTOTAL, CKMB, TROPONINI in the last 72 hours.  Recent Labs  08/05/15 1136  TROPIPOC 0.10*    Echo: Study Date: 05/14/2014 LV EF: 50% -  55% Study Conclusions - Left ventricle: The cavity size was normal. Wall thickness was increased in a pattern of moderate LVH. Systolic function was normal. The estimated ejection fraction was in the range of 50% to 55%. Regional wall motion abnormalities cannot be excluded. Doppler parameters are consistent with abnormal left ventricular relaxation (grade 1 diastolic dysfunction). The E/e&' ratio is >15, suggesting elevated LV filling pressure. - Mitral valve: Calcified annulus. Mildly thickened leaflets . There was mild regurgitation. - Left atrium: The atrium was normal in size. - Atrial septum: No defect or patent foramen ovale was identified. - Tricuspid  valve: There was mild  regurgitation. - Pulmonary arteries: PA peak pressure: 35 mm Hg (S). Impressions: - Compared to the prior echo in 01/2013, the EF has now near-normalized to 50-55%.  ECG:  HR 70: Sinus rhythm LVH with secondary repolarization abnormality No significant change since last tracing  Radiology:  Dg Chest 2 View  08/05/2015  CLINICAL DATA:  Shortness of breath, worsening over last month. Dry cough. EXAM: CHEST  2 VIEW COMPARISON:  06/10/2014 FINDINGS: Cardiomegaly with vascular congestion. No overt edema. No confluent opacities. Small bilateral effusions on the lateral view. No acute bony abnormality. IMPRESSION: Cardiomegaly with vascular congestion and small effusions. Electronically Signed   By: Rolm Baptise M.D.   On: 08/05/2015 11:52    ASSESSMENT AND PLAN:    Principal Problem:   CHF, acute on chronic (HCC) Active Problems:   Diabetes mellitus due to abnormal insulin (HCC)   Hyperlipidemia   CAD (coronary artery disease)   Obesity   Anemia   Essential hypertension   Acute renal failure superimposed on stage 3 chronic kidney disease (HCC)   Elevated troponin  Kathleen [name] is a 70 y.o. female with a history of HTN, DM, HLD, CKD (06/12/14 Baseline Cr 2.2 ), carotid artery disease, tobacco abuse (quit 3 months ago), dementia and chronic mixed S/D CHF (EF recently improved) who presented to [facility] ED today after a mechanical fall. However, it appears she is in an acute CHF exacerbation and being admitted for further w/up and management.   Acute on chronic mixed S/D CHF: EF 25-30%--> 50-55% in 06/2014.  She appears volume overloaded on exam. BNP 892. CXR with pulmonary vascular congestion and bilateral pleural effusions -- Will give her one dose of 80mg  IV lasix. Watch renal function carefully. Creat is up from 2.52--> 3.78. -- Will repeat 2D ECHO  -- Continue Coreg 12.5mg  BID and hydralazine 50mg  TID/ imdur 30. No ACE/ARB due to CKD.  Carotid artery disease: Left ICA 60-79% stenosis.  60-79% RICA stenosis. F/U carotid duplex in 6 months (supposed to be in October of this year.)    Acute on chronic CKD: Creat is up from 2.52--> 3.78. Monitor closely in the setting of IV diuresis.   Elevated troponin: 0.10 c/w demand ischemia. Continue to monitor. Last cath in 2015 with no interventions and medical therapy recommended  Somnolence: patient not acting like herself Likely not completely due to CHF. Per IM. Would check a blood gas to check for hypercarbia. Question neurologic causes?  SignedCrista Luria 08/05/2015 1:35 PM  Pager [phone]  Co-Sign MD   Attending Note:   The patient was seen and examined.  Agree with assessment and plan as noted above.  Changes made to the above note as needed.  1. Altered mental status:  Pt is admitted to the hospital after falling this am She is very lethargic and I think this fall is related to her altered mental status She does have some degree of heart failure but I dont think this is the primary illness that is causing her somulence.  ? CO2 retention ? ammonia level  ? Need for head CT  ? Pneumonia ? UTI    2. Chronic combined systolic and diastolic  CHF:    Does not appear to be significantly volume overloaded to me. Agree with 1 dose of lasix and we can reevaluate  3. Elevated Troponin:   Likely due to her CHF in the setting of acute renal insufficiency Is not c/w ACS.  Thayer Headings, Brooke Bonito., MD, [facility] 08/05/2015, 2:53 PM [address],  [place] Pager [phone]

## 2015-08-05 NOTE — ED Notes (Signed)
Bipap applied at this time. Respiratory at bedside.

## 2015-08-05 NOTE — ED Notes (Signed)
Pt to ER via [facility] with complaint of shortness of breath, intermittently, for a few weeks. Pt has hx of CHF, DM, and HTN. Pt was found to be 90% on RA this morning and was placed on 4L to get sats up to 98%. Pt is a/o x4. Reports orthopnea. Pt presents with bilateral LE +3 pitting edema. Pt hasnt seen PCP in a few months.

## 2015-08-06 ENCOUNTER — Inpatient Hospital Stay (HOSPITAL_COMMUNITY): Payer: Medicare Other

## 2015-08-06 ENCOUNTER — Other Ambulatory Visit (HOSPITAL_COMMUNITY): Payer: Medicare Other

## 2015-08-06 DIAGNOSIS — E870 Hyperosmolality and hypernatremia: Secondary | ICD-10-CM | POA: Diagnosis present

## 2015-08-06 DIAGNOSIS — R400 Somnolence: Secondary | ICD-10-CM | POA: Diagnosis present

## 2015-08-06 DIAGNOSIS — R0600 Dyspnea, unspecified: Secondary | ICD-10-CM

## 2015-08-06 DIAGNOSIS — E872 Acidosis: Secondary | ICD-10-CM | POA: Diagnosis present

## 2015-08-06 DIAGNOSIS — J9602 Acute respiratory failure with hypercapnia: Secondary | ICD-10-CM | POA: Diagnosis present

## 2015-08-06 DIAGNOSIS — E8729 Other acidosis: Secondary | ICD-10-CM | POA: Diagnosis present

## 2015-08-06 DIAGNOSIS — I5041 Acute combined systolic (congestive) and diastolic (congestive) heart failure: Secondary | ICD-10-CM | POA: Diagnosis present

## 2015-08-06 DIAGNOSIS — I5033 Acute on chronic diastolic (congestive) heart failure: Secondary | ICD-10-CM

## 2015-08-06 LAB — BLOOD GAS, ARTERIAL
ACID-BASE DEFICIT: 1.2 mmol/L (ref 0.0–2.0)
Acid-base deficit: 1.4 mmol/L (ref 0.0–2.0)
BICARBONATE: 24.9 meq/L — AB (ref 20.0–24.0)
Bicarbonate: 25.6 mEq/L — ABNORMAL HIGH (ref 20.0–24.0)
DELIVERY SYSTEMS: POSITIVE
DRAWN BY: 280981
Delivery systems: POSITIVE
Drawn by: 280981
Expiratory PAP: 5
Expiratory PAP: 5
FIO2: 0.4
FIO2: 0.3
Inspiratory PAP: 17
Inspiratory PAP: 17
MODE: POSITIVE
Mode: POSITIVE
O2 Saturation: 98.3 %
O2 Saturation: 97.4 %
PH ART: 7.263 — AB (ref 7.350–7.450)
Patient temperature: 98.6
Patient temperature: 98.6
TCO2: 27.6 mmol/L (ref 0–100)
TCO2: 26.7 mmol/L (ref 0–100)
pCO2 arterial: 66.6 mmHg (ref 35.0–45.0)
pCO2 arterial: 57.1 mmHg (ref 35.0–45.0)
pH, Arterial: 7.208 — ABNORMAL LOW (ref 7.350–7.450)
pO2, Arterial: 120 mmHg — ABNORMAL HIGH (ref 80.0–100.0)
pO2, Arterial: 100 mmHg (ref 80.0–100.0)

## 2015-08-06 LAB — COMPREHENSIVE METABOLIC PANEL
ALBUMIN: 2.3 g/dL — AB (ref 3.5–5.0)
ALK PHOS: 82 U/L (ref 38–126)
ALT: 14 U/L (ref 14–54)
AST: 17 U/L (ref 15–41)
Anion gap: 11 (ref 5–15)
BUN: 60 mg/dL — ABNORMAL HIGH (ref 6–20)
CALCIUM: 7 mg/dL — AB (ref 8.9–10.3)
CO2: 24 mmol/L (ref 22–32)
CREATININE: 3.82 mg/dL — AB (ref 0.44–1.00)
Chloride: 113 mmol/L — ABNORMAL HIGH (ref 101–111)
GFR calc Af Amer: 13 mL/min — ABNORMAL LOW (ref >60)
GFR calc non Af Amer: 11 mL/min — ABNORMAL LOW (ref >60)
GLUCOSE: 76 mg/dL (ref 65–99)
Potassium: 5.2 mmol/L — ABNORMAL HIGH (ref 3.5–5.1)
SODIUM: 148 mmol/L — AB (ref 135–145)
Total Bilirubin: 0.4 mg/dL (ref 0.3–1.2)
Total Protein: 5.1 g/dL — ABNORMAL LOW (ref 6.5–8.1)

## 2015-08-06 LAB — BASIC METABOLIC PANEL
ANION GAP: 12 (ref 5–15)
BUN: 58 mg/dL — ABNORMAL HIGH (ref 6–20)
CALCIUM: 6.9 mg/dL — AB (ref 8.9–10.3)
CO2: 25 mmol/L (ref 22–32)
CREATININE: 3.8 mg/dL — AB (ref 0.44–1.00)
Chloride: 112 mmol/L — ABNORMAL HIGH (ref 101–111)
GFR, EST AFRICAN AMERICAN: 13 mL/min — AB (ref >60)
GFR, EST NON AFRICAN AMERICAN: 11 mL/min — AB (ref >60)
Glucose, Bld: 109 mg/dL — ABNORMAL HIGH (ref 65–99)
Potassium: 5.3 mmol/L — ABNORMAL HIGH (ref 3.5–5.1)
SODIUM: 149 mmol/L — AB (ref 135–145)

## 2015-08-06 LAB — GLUCOSE, CAPILLARY
GLUCOSE-CAPILLARY: 109 mg/dL — AB (ref 65–99)
GLUCOSE-CAPILLARY: 82 mg/dL (ref 65–99)
GLUCOSE-CAPILLARY: 115 mg/dL — AB (ref 65–99)
Glucose-Capillary: 62 mg/dL — ABNORMAL LOW (ref 65–99)
Glucose-Capillary: 103 mg/dL — ABNORMAL HIGH (ref 65–99)

## 2015-08-06 LAB — CBC WITH DIFFERENTIAL/PLATELET
BASOS ABS: 0 10*3/uL (ref 0.0–0.1)
BASOS PCT: 0 %
Eosinophils Absolute: 0.3 10*3/uL (ref 0.0–0.7)
Eosinophils Relative: 7 %
HEMATOCRIT: 33.9 % — AB (ref 36.0–46.0)
Hemoglobin: 9.7 g/dL — ABNORMAL LOW (ref 12.0–15.0)
LYMPHS PCT: 18 %
Lymphs Abs: 0.8 10*3/uL (ref 0.7–4.0)
MCH: 26.2 pg (ref 26.0–34.0)
MCHC: 28.6 g/dL — ABNORMAL LOW (ref 30.0–36.0)
MCV: 91.6 fL (ref 78.0–100.0)
MONO ABS: 0.5 10*3/uL (ref 0.1–1.0)
Monocytes Relative: 13 %
NEUTROS ABS: 2.7 10*3/uL (ref 1.7–7.7)
Neutrophils Relative %: 62 %
PLATELETS: 221 10*3/uL (ref 150–400)
RBC: 3.7 MIL/uL — AB (ref 3.87–5.11)
RDW: 17.1 % — AB (ref 11.5–15.5)
WBC: 4.3 10*3/uL (ref 4.0–10.5)

## 2015-08-06 LAB — POCT I-STAT 3, ART BLOOD GAS (G3+)
Acid-base deficit: 4 mmol/L — ABNORMAL HIGH (ref 0.0–2.0)
Bicarbonate: 24.9 mEq/L — ABNORMAL HIGH (ref 20.0–24.0)
O2 SAT: 96 %
PH ART: 7.188 — AB (ref 7.350–7.450)
TCO2: 27 mmol/L (ref 0–100)
pCO2 arterial: 65.4 mmHg (ref 35.0–45.0)
pO2, Arterial: 108 mmHg — ABNORMAL HIGH (ref 80.0–100.0)

## 2015-08-06 LAB — LIPID PANEL
Cholesterol: 228 mg/dL — ABNORMAL HIGH (ref 0–200)
HDL: 43 mg/dL (ref >40)
LDL CALC: 156 mg/dL — AB (ref 0–99)
Total CHOL/HDL Ratio: 5.3 RATIO
Triglycerides: 147 mg/dL (ref <150)
VLDL: 29 mg/dL (ref 0–40)

## 2015-08-06 LAB — MAGNESIUM: Magnesium: 1.6 mg/dL — ABNORMAL LOW (ref 1.7–2.4)

## 2015-08-06 LAB — TROPONIN I: Troponin I: 0.1 ng/mL — ABNORMAL HIGH (ref <0.031)

## 2015-08-06 MED ORDER — PNEUMOCOCCAL VAC POLYVALENT 25 MCG/0.5ML IJ INJ
0.5000 mL | INJECTION | INTRAMUSCULAR | Status: AC
  Administered 2015-08-08: 0.5 mL via INTRAMUSCULAR
  Filled 2015-08-06: qty 0.5

## 2015-08-06 MED ORDER — DEXTROSE 5 % IV SOLN
INTRAVENOUS | Status: DC
  Administered 2015-08-06 (×2): via INTRAVENOUS

## 2015-08-06 MED ORDER — FUROSEMIDE 10 MG/ML IJ SOLN
60.0000 mg | Freq: Once | INTRAMUSCULAR | Status: AC
  Administered 2015-08-06: 60 mg via INTRAVENOUS
  Filled 2015-08-06: qty 6

## 2015-08-06 MED ORDER — CHLORHEXIDINE GLUCONATE 0.12 % MT SOLN
15.0000 mL | Freq: Two times a day (BID) | OROMUCOSAL | Status: DC
  Administered 2015-08-06 – 2015-08-07 (×3): 15 mL via OROMUCOSAL
  Filled 2015-08-06 (×2): qty 15

## 2015-08-06 MED ORDER — FUROSEMIDE 10 MG/ML IJ SOLN
40.0000 mg | Freq: Once | INTRAMUSCULAR | Status: AC
  Administered 2015-08-06: 40 mg via INTRAVENOUS
  Filled 2015-08-06: qty 4

## 2015-08-06 MED ORDER — CETYLPYRIDINIUM CHLORIDE 0.05 % MT LIQD
7.0000 mL | Freq: Two times a day (BID) | OROMUCOSAL | Status: DC
  Administered 2015-08-06 – 2015-08-07 (×3): 7 mL via OROMUCOSAL

## 2015-08-06 MED ORDER — DEXTROSE 50 % IV SOLN
INTRAVENOUS | Status: AC
  Administered 2015-08-06: 50 mL
  Filled 2015-08-06: qty 50

## 2015-08-06 NOTE — Progress Notes (Signed)
PROGRESS NOTE  Subjective:    69 y.o. female with a history of HTN, DM, HLD, CKD (06/12/14 Baseline Cr 2.2 ), carotid artery disease, tobacco abuse (quit 3 months ago), dementia and chronic mixed S/D CHF (EF recently improved) Admitted to [facility] with altered mental status and a fall.  Has been found to have acute worsening of her renal function   Still somulent On BIPAP,   Still has elevated pCO2 and is acidotic.     Objective:    Vital Signs:   Temp:  [98 F (36.7 C)-98.6 F (37 C)] 98.3 F (36.8 C) (12/29 0720) Pulse Rate:  [65-81] 75 (12/29 0900) Resp:  [9-28] 10 (12/29 0900) BP: (103-165)/(48-137) 148/62 mmHg (12/29 0900) SpO2:  [96 %-100 %] 100 % (12/29 0900) FiO2 (%):  [40 %] 40 % (12/29 0700) Weight:  [205 lb 0.4 oz (93 kg)-208 lb 1.8 oz (94.4 kg)] 207 lb 14.3 oz (94.3 kg) (12/29 0500)  Last BM Date: 08/05/15   24-hour weight change: Weight change:   Weight trends: Filed Weights   08/05/15 1850 08/05/15 2300 08/06/15 0500  Weight: 205 lb 0.4 oz (93 kg) 208 lb 1.8 oz (94.4 kg) 207 lb 14.3 oz (94.3 kg)    Intake/Output:  12/28 0701 - 12/29 0700 In: -  Out: 705 [Urine:705] Total I/O In: -  Out: 250 [Urine:250]   Physical Exam: BP 148/62 mmHg  Pulse 75  Temp(Src) 98.3 F (36.8 C) (Axillary)  Resp 10  Ht 5\' 3"  (1.6 m)  Wt 207 lb 14.3 oz (94.3 kg)  BMI 36.84 kg/m2  SpO2 100%  Wt Readings from Last 3 Encounters:  08/06/15 207 lb 14.3 oz (94.3 kg)  05/07/15 200 lb (90.719 kg)  11/20/14 191 lb 12.8 oz (87 kg)    General: Vital signs reviewed and noted.   Head: Normocephalic, atraumatic.  Eyes: conjunctivae/corneas clear.  EOM's intact.   Throat: normal  Neck:  normal   Lungs:    mostly clear , BIPAP in place   Heart:  RR  Abdomen:  Soft, non-tender, non-distended    Extremities: No edema    Neurologic: A&O X3, CN II - XII are grossly intact. , somulent   Psych: Normal     Labs: BMET:  Recent Labs  08/06/15 0300 08/06/15 0940    NA 149* 148*  K 5.3* 5.2*  CL 112* 113*  CO2 25 24  GLUCOSE 109* 76  BUN 58* 60*  CREATININE 3.80* 3.82*  CALCIUM 6.9* 7.0*  MG  --  1.6*    Liver function tests:  Recent Labs  08/06/15 0940  AST 17  ALT 14  ALKPHOS 82  BILITOT 0.4  PROT 5.1*  ALBUMIN 2.3*   No results for input(s): LIPASE, AMYLASE in the last 72 hours.  CBC:  Recent Labs  08/05/15 1130  WBC 5.2  NEUTROABS 3.5  HGB 9.7*  HCT 32.8*  MCV 88.4  PLT 217    Cardiac Enzymes:  Recent Labs  08/05/15 2200 08/06/15 0300  TROPONINI 0.10* 0.10*    Coagulation Studies: No results for input(s): LABPROT, INR in the last 72 hours.  Other: Invalid input(s): POCBNP No results for input(s): DDIMER in the last 72 hours. No results for input(s): HGBA1C in the last 72 hours.  Recent Labs  08/06/15 0940  CHOL 228*  HDL 43  LDLCALC 156*  TRIG 147  CHOLHDL 5.3   No results for input(s): TSH, T4TOTAL, T3FREE, THYROIDAB in the last 72  hours.  Invalid input(s): FREET3 No results for input(s): VITAMINB12, FOLATE, FERRITIN, TIBC, IRON, RETICCTPCT in the last 72 hours.   Other results:  EKG  ( personally reviewed )  - NSR at 68.   Medications:    Infusions: . dextrose 40 mL/hr at 08/06/15 0945    Scheduled Medications: . antiseptic oral rinse  7 mL Mouth Rinse q12n4p  . aspirin  325 mg Oral Daily  . brimonidine  1 drop Right Eye Q12H   And  . timolol  1 drop Right Eye Q12H  . carvedilol  12.5 mg Oral BID WC  . chlorhexidine  15 mL Mouth Rinse BID  . heparin  5,000 Units Subcutaneous 3 times per day  . hydrALAZINE  50 mg Oral 3 times per day  . insulin aspart  0-20 Units Subcutaneous TID WC  . insulin aspart  0-5 Units Subcutaneous QHS  . insulin detemir  7 Units Subcutaneous QHS  . isosorbide mononitrate  30 mg Oral Daily  . [START ON 08/07/2015] pneumococcal 23 valent vaccine  0.5 mL Intramuscular Tomorrow-1000  . simvastatin  20 mg Oral q1800  . sodium chloride  3 mL Intravenous  Q12H    Assessment/ Plan:   Principal Problem:   CHF, acute on chronic (HCC) Active Problems:   Diabetes mellitus due to abnormal insulin (HCC)   Hyperlipidemia   CAD (coronary artery disease)   Obesity   Anemia   Essential hypertension   Acute renal failure superimposed on stage 3 chronic kidney disease (HCC)   Elevated troponin   CHF (congestive heart failure) (HCC)   Acute on chronic heart failure ([place])  1.  Chronic diastolic CHF :  Her presenting symptoms are not really c/w worsening CHF She has CO2 retention with acidosis. Also has worsening renal funciton Repeat echo has been ordered.  No new recs at this time    Disposition:  Length of Stay: 1  Thayer Headings, Brooke Bonito., MD, [facility] 08/06/2015, 11:17 AM Office [phone] Pager [phone]

## 2015-08-06 NOTE — Progress Notes (Signed)
Home meds sent to pharmacy.

## 2015-08-06 NOTE — Progress Notes (Signed)
  Echocardiogram 2D Echocardiogram has been performed.  Kathleen [name] 08/06/2015, 12:17 PM

## 2015-08-06 NOTE — Consult Note (Addendum)
WOC wound consult note Reason for Consult: Consult requested for BLE.  Pt has dry intact different-colored scar tissue intact to bilat anterior calves from previous wounds which have healed.  No open wounds or drainage to these locations. Left posterior calf with previous blister which has ruptured and evolved into partial thickness skin loss; 2.8X4X.1cm, pink moist wound bed and small amt yellow drainage. Plan: foam dressing to protect and promote healing. Please re-consult if further assistance is needed.  Thank-you,  Julien Girt MSN, [place], [place], [place], [place]

## 2015-08-06 NOTE — Progress Notes (Signed)
Utilization Review Completed.Kathleen [name] T12/29/2016  

## 2015-08-06 NOTE — Progress Notes (Signed)
Notified K Schorr, NP regarding ABG results. Patient arousable with minor stimulation and answering questions appropriately. No new orders received at this time. Will do repeat gas later this AM. RN will continue to monitor patient closely.

## 2015-08-06 NOTE — Progress Notes (Signed)
[place] TEAM 1 - Stepdown/ICU TEAM Progress Note  Kathleen [name] [id number] DOB: [birth year] DOA: 08/05/2015 PCP: Benito Mccreedy, MD  Admit HPI / Brief Narrative: 69 y.o. BF PMHx HTN, DM Type 2, HLD, CKD stage III, CAD native artery, Chronic Combined CHF (01/2014 EF 25-30% by echo),   Presents the emergency department with chief complaint of gradual worsening shortness of breath, increased lower extremity edema and worsening orthopnea. Initial evaluation reveals acute on chronic combined heart failure.  Information is obtained from the patient and the daughter who is at the bedside. Patient reports a gradual worsening shortness of breath of the last 4 weeks. Should symptoms include sleep lower extremity edema and orthopnea. She reports that she has Lasix on her home medication list and she took some yesterday and the day before but she does not take daily. She denies chest pain palpitations headache dizziness syncope or near-syncope. She denies abdominal pain nausea vomiting. She denies dysuria hematuria ferequency or urgency.  In the emergency department she is afebrile hemodynamically stable oxygen saturation level 90% on room air. She is placed on 2 L nasal cannula.   HPI/Subjective: 12/29  A/O x 1(Does not know where, when, why). States does not weigh Daily and does not know [employer].     Assessment/Plan: AMS -Most likely secondary to hypercapnia, improving with improvement in respiratory acidosis  Acute REspiratory Failure w/ Hypercapnia/Respiratory Acidosis -Continue BiPAP; decrease O2 to 30%  -Continue to attempt to wean patient from BiPAP; goal SPO2> 90% -Obtain ABG in A.m.  Pulmonary Vascular Congestion/Lt Pleural Effusion? -Lasix IV 60 mg 1  Acute Systolic and diastolic CHF - Q000111Q echocardiogram see results below  -Strict I&O -Daily a.m. weight   Hypernatremia -D5W 60 ml/hr, monitor closely for fluid overload        Code Status: FULL Family  Communication: no family present at time of exam Disposition Plan: Resolution altered mental status    Consultants: NA  Procedure/Significant Events: 12/29 echocardiogram;- Left ventricle: severeconcentric hypertrophy. -LVEF= 40% to 45%. - (grade 1diastolic dysfunction). - Tricuspid valve: moderate regurgitation. - Pulmonary arteries: PA peak pressure: 45 mm Hg (S).    Culture 12/28 MRSA by PCR negative  Antibiotics: NA  DVT prophylaxis: Subcutaneous heparin   Devices    LINES / TUBES:      Continuous Infusions: . dextrose 60 mL/hr at 08/06/15 2002    Objective: VITAL SIGNS: Temp: 99.5 F (37.5 C) (12/29 2009) Temp Source: Oral (12/29 2009) BP: 135/57 mmHg (12/29 2000) Pulse Rate: 71 (12/29 2000) BiPAP Set RR; 18 IPAP; 17 EPAP; 5 O2; 30%    Intake/Output Summary (Last 24 hours) at 08/06/15 2017 Last data filed at 08/06/15 1800  Gross per 24 hour  Intake    330 ml  Output   2080 ml  Net  -1750 ml     Exam: General:  A/O x 1(Does not know where, when, why), positive acute respiratory distress Eyes: Negative headache, eye pain, double vision,negative scleral hemorrhage ENT: Negative Runny nose, negative ear pain, negative gingival bleeding, Neck:  Negative scars, masses, torticollis, lymphadenopathy, JVD Lungs:  Diffusely dec breath sounds; absent breath sounds LLE, negative  wheezes or crackles Cardiovascular: Regular rate and rhythm without murmur gallop or rub normal S1 and S2 Abdomen:negative abdominal pain, nondistended, positive soft, bowel sounds, no rebound, no ascites, no appreciable mass Extremities: No significant cyanosis, clubbing, or edema bilateral lower extremities Psychiatric:   unable to fully evaluate secondary to altered mental status  Neurologic:  unable to  fully evaluate secondary to altered mental status     Data Reviewed: Basic Metabolic Panel:  Recent Labs Lab 08/05/15 1130 08/06/15 0300 08/06/15 0940  NA 146*  149* 148*  K 5.1 5.3* 5.2*  CL 111 112* 113*  CO2 26 25 24   GLUCOSE 112* 109* 76  BUN 57* 58* 60*  CREATININE 3.78* 3.80* 3.82*  CALCIUM 6.6* 6.9* 7.0*  MG  --   --  1.6*   Liver Function Tests:  Recent Labs Lab 08/06/15 0940  AST 17  ALT 14  ALKPHOS 82  BILITOT 0.4  PROT 5.1*  ALBUMIN 2.3*   No results for input(s): LIPASE, AMYLASE in the last 168 hours.  Recent Labs Lab 08/05/15 1603  AMMONIA 15   CBC:  Recent Labs Lab 08/05/15 1130 08/06/15 1140  WBC 5.2 4.3  NEUTROABS 3.5 2.7  HGB 9.7* 9.7*  HCT 32.8* 33.9*  MCV 88.4 91.6  PLT 217 221   Cardiac Enzymes:  Recent Labs Lab 08/05/15 2200 08/06/15 0300  TROPONINI 0.10* 0.10*   BNP (last 3 results)  Recent Labs  08/05/15 1130  BNP 892.4*    ProBNP (last 3 results) No results for input(s): PROBNP in the last 8760 hours.  CBG:  Recent Labs Lab 08/05/15 1738 08/05/15 2258 08/06/15 0933 08/06/15 1043 08/06/15 1151  GLUCAP 97 109* 62* 103* 82    Recent Results (from the past 240 hour(s))  MRSA PCR Screening     Status: None   Collection Time: 08/05/15  6:53 PM  Result Value Ref Range Status   MRSA by PCR NEGATIVE NEGATIVE Final    Comment:        The GeneXpert MRSA Assay (FDA approved for NASAL specimens only), is one component of a comprehensive MRSA colonization surveillance program. It is not intended to diagnose MRSA infection nor to guide or monitor treatment for MRSA infections.      Studies:  Recent x-ray studies have been reviewed in detail by the Attending Physician  Scheduled Meds:  Scheduled Meds: . antiseptic oral rinse  7 mL Mouth Rinse q12n4p  . aspirin  325 mg Oral Daily  . brimonidine  1 drop Right Eye Q12H   And  . timolol  1 drop Right Eye Q12H  . carvedilol  12.5 mg Oral BID WC  . chlorhexidine  15 mL Mouth Rinse BID  . furosemide  60 mg Intravenous Once  . heparin  5,000 Units Subcutaneous 3 times per day  . hydrALAZINE  50 mg Oral 3 times per day    . insulin aspart  0-20 Units Subcutaneous TID WC  . insulin aspart  0-5 Units Subcutaneous QHS  . insulin detemir  7 Units Subcutaneous QHS  . isosorbide mononitrate  30 mg Oral Daily  . [START ON 08/07/2015] pneumococcal 23 valent vaccine  0.5 mL Intramuscular Tomorrow-1000  . simvastatin  20 mg Oral q1800  . sodium chloride  3 mL Intravenous Q12H    Time spent on care of this patient: 40 mins   WOODS, Geraldo Docker , MD  [facility] Office  [phone] Pager [phone]  On-Call/Text Page:      [email or web address]      password [email or web address]  If 7PM-7AM, please contact night-coverage [email or web address] Password [email or web address] 08/06/2015, 8:17 PM   LOS: 1 day   Care during the described time interval was provided by me .  I have reviewed this patient's available data, including medical history, events of note, physical examination, and all test results as  part of my evaluation. I have personally reviewed and interpreted all radiology studies.   Dia Crawford, MD [phone] Pager

## 2015-08-06 NOTE — Progress Notes (Addendum)
RN went to assess patient and patient was unable to carry a conversation or keep eyes open. Patient required repeated stimulation (change from previous assessment) and seemed very somnolent. Schorr, NP paged regarding change. STAT ABG obtained and notified Schorr, NP of results. Orders also received to hold all PO meds due to somnolence. NP now at bedside. RN will continue to monitor patient closely.

## 2015-08-07 DIAGNOSIS — R410 Disorientation, unspecified: Secondary | ICD-10-CM | POA: Diagnosis present

## 2015-08-07 DIAGNOSIS — I272 Other secondary pulmonary hypertension: Secondary | ICD-10-CM

## 2015-08-07 LAB — POCT I-STAT 3, ART BLOOD GAS (G3+)
ACID-BASE EXCESS: 1 mmol/L (ref 0.0–2.0)
Bicarbonate: 26.7 mEq/L — ABNORMAL HIGH (ref 20.0–24.0)
O2 SAT: 94 %
PH ART: 7.361 (ref 7.350–7.450)
PO2 ART: 75 mmHg — AB (ref 80.0–100.0)
TCO2: 28 mmol/L (ref 0–100)
pCO2 arterial: 47.1 mmHg — ABNORMAL HIGH (ref 35.0–45.0)

## 2015-08-07 LAB — CBC WITH DIFFERENTIAL/PLATELET
BASOS ABS: 0 10*3/uL (ref 0.0–0.1)
BASOS PCT: 0 %
EOS PCT: 6 %
Eosinophils Absolute: 0.2 10*3/uL (ref 0.0–0.7)
HCT: 31.8 % — ABNORMAL LOW (ref 36.0–46.0)
Hemoglobin: 9.2 g/dL — ABNORMAL LOW (ref 12.0–15.0)
LYMPHS PCT: 23 %
Lymphs Abs: 1 10*3/uL (ref 0.7–4.0)
MCH: 25.5 pg — ABNORMAL LOW (ref 26.0–34.0)
MCHC: 28.9 g/dL — AB (ref 30.0–36.0)
MCV: 88.1 fL (ref 78.0–100.0)
MONO ABS: 0.5 10*3/uL (ref 0.1–1.0)
MONOS PCT: 11 %
Neutro Abs: 2.6 10*3/uL (ref 1.7–7.7)
Neutrophils Relative %: 60 %
PLATELETS: 260 10*3/uL (ref 150–400)
RBC: 3.61 MIL/uL — ABNORMAL LOW (ref 3.87–5.11)
RDW: 16.8 % — AB (ref 11.5–15.5)
WBC: 4.3 10*3/uL (ref 4.0–10.5)

## 2015-08-07 LAB — GLUCOSE, CAPILLARY
GLUCOSE-CAPILLARY: 87 mg/dL (ref 65–99)
GLUCOSE-CAPILLARY: 157 mg/dL — AB (ref 65–99)
Glucose-Capillary: 163 mg/dL — ABNORMAL HIGH (ref 65–99)
Glucose-Capillary: 161 mg/dL — ABNORMAL HIGH (ref 65–99)

## 2015-08-07 LAB — COMPREHENSIVE METABOLIC PANEL
ALT: 11 U/L — AB (ref 14–54)
AST: 12 U/L — AB (ref 15–41)
Albumin: 2.1 g/dL — ABNORMAL LOW (ref 3.5–5.0)
Alkaline Phosphatase: 75 U/L (ref 38–126)
Anion gap: 11 (ref 5–15)
BILIRUBIN TOTAL: 0.7 mg/dL (ref 0.3–1.2)
BUN: 57 mg/dL — AB (ref 6–20)
CO2: 26 mmol/L (ref 22–32)
CREATININE: 3.6 mg/dL — AB (ref 0.44–1.00)
Calcium: 7.1 mg/dL — ABNORMAL LOW (ref 8.9–10.3)
Chloride: 110 mmol/L (ref 101–111)
GFR calc Af Amer: 14 mL/min — ABNORMAL LOW (ref >60)
GFR, EST NON AFRICAN AMERICAN: 12 mL/min — AB (ref >60)
Glucose, Bld: 106 mg/dL — ABNORMAL HIGH (ref 65–99)
Potassium: 4.8 mmol/L (ref 3.5–5.1)
Sodium: 147 mmol/L — ABNORMAL HIGH (ref 135–145)
TOTAL PROTEIN: 5.2 g/dL — AB (ref 6.5–8.1)

## 2015-08-07 LAB — HEMOGLOBIN A1C
HEMOGLOBIN A1C: 7.4 % — AB (ref 4.8–5.6)
Mean Plasma Glucose: 166 mg/dL

## 2015-08-07 LAB — MAGNESIUM: MAGNESIUM: 1.6 mg/dL — AB (ref 1.7–2.4)

## 2015-08-07 MED ORDER — CETYLPYRIDINIUM CHLORIDE 0.05 % MT LIQD: 7.0000 mL | Freq: Two times a day (BID) | OROMUCOSAL | Status: DC

## 2015-08-07 MED ORDER — CETYLPYRIDINIUM CHLORIDE 0.05 % MT LIQD
7.0000 mL | Freq: Two times a day (BID) | OROMUCOSAL | Status: DC
  Administered 2015-08-08 – 2015-08-14 (×11): 7 mL via OROMUCOSAL

## 2015-08-07 MED ORDER — ATORVASTATIN CALCIUM 20 MG PO TABS
20.0000 mg | ORAL_TABLET | Freq: Every day | ORAL | Status: DC
  Administered 2015-08-07 – 2015-08-13 (×7): 20 mg via ORAL
  Filled 2015-08-07 (×7): qty 1

## 2015-08-07 MED ORDER — TORSEMIDE 20 MG PO TABS
20.0000 mg | ORAL_TABLET | Freq: Every day | ORAL | Status: DC
  Administered 2015-08-07 – 2015-08-08 (×2): 20 mg via ORAL
  Filled 2015-08-07 (×2): qty 1

## 2015-08-07 MED ORDER — MAGNESIUM SULFATE 50 % IJ SOLN
3.0000 g | Freq: Once | INTRAVENOUS | Status: AC
  Administered 2015-08-07: 3 g via INTRAVENOUS
  Filled 2015-08-07: qty 6

## 2015-08-07 NOTE — Progress Notes (Signed)
Pt taken off Bipap at this time and placed on 2L [place] tolerating well, pt awake and alert,  no distress noted at this time, RT will monitor

## 2015-08-07 NOTE — Progress Notes (Signed)
PROGRESS NOTE  Subjective:    69 y.o. female with a history of HTN, DM, HLD, CKD (06/12/14 Baseline Cr 2.2 ), carotid artery disease, tobacco abuse (quit 3 months ago), dementia and chronic mixed S/D CHF (EF recently improved) Admitted to [facility] with altered mental status and a fall.  Has been found to have acute worsening of her renal function   She is much more awake today . Was just taken off BIPAP .   Has had significant CO2 retnetion    Objective:    Vital Signs:   Temp:  [97.2 F (36.2 C)-99.5 F (37.5 C)] 97.2 F (36.2 C) (12/30 0809) Pulse Rate:  [63-80] 78 (12/30 0819) Resp:  [0-19] 15 (12/30 0819) BP: (125-182)/(51-127) 159/60 mmHg (12/30 0809) SpO2:  [95 %-100 %] 99 % (12/30 0819) FiO2 (%):  [30 %-40 %] 30 % (12/30 0809) Weight:  [209 lb 14.1 oz (95.2 kg)] 209 lb 14.1 oz (95.2 kg) (12/30 0500)  Last BM Date: 08/05/15   24-hour weight change: Weight change: 4 lb 13.6 oz (2.2 kg)  Weight trends: Filed Weights   08/05/15 2300 08/06/15 0500 08/07/15 0500  Weight: 208 lb 1.8 oz (94.4 kg) 207 lb 14.3 oz (94.3 kg) 209 lb 14.1 oz (95.2 kg)    Intake/Output:  12/29 0701 - 12/30 0700 In: 1009.3 [I.V.:1009.3] Out: 2875 [Urine:2875]     Physical Exam: BP 159/60 mmHg  Pulse 78  Temp(Src) 97.2 F (36.2 C) (Oral)  Resp 15  Ht 5\' 3"  (1.6 m)  Wt 209 lb 14.1 oz (95.2 kg)  BMI 37.19 kg/m2  SpO2 99%  Wt Readings from Last 3 Encounters:  08/07/15 209 lb 14.1 oz (95.2 kg)  05/07/15 200 lb (90.719 kg)  11/20/14 191 lb 12.8 oz (87 kg)    General: Vital signs reviewed and noted.   Head: Normocephalic, atraumatic.  Eyes: conjunctivae/corneas clear.  EOM's intact.   Throat: normal  Neck:  normal   Lungs:    few rales, some consolidation in right base   Heart:  RR  Abdomen:  Soft, non-tender, non-distended    Extremities: No edema    Neurologic: A&O X3, CN II - XII are grossly intact. ,    Psych: Normal     Labs: BMET:  Recent Labs  08/06/15 0940  08/07/15 0505  NA 148* 147*  K 5.2* 4.8  CL 113* 110  CO2 24 26  GLUCOSE 76 106*  BUN 60* 57*  CREATININE 3.82* 3.60*  CALCIUM 7.0* 7.1*  MG 1.6* 1.6*    Liver function tests:  Recent Labs  08/06/15 0940 08/07/15 0505  AST 17 12*  ALT 14 11*  ALKPHOS 82 75  BILITOT 0.4 0.7  PROT 5.1* 5.2*  ALBUMIN 2.3* 2.1*   No results for input(s): LIPASE, AMYLASE in the last 72 hours.  CBC:  Recent Labs  08/06/15 1140 08/07/15 0505  WBC 4.3 4.3  NEUTROABS 2.7 2.6  HGB 9.7* 9.2*  HCT 33.9* 31.8*  MCV 91.6 88.1  PLT 221 260    Cardiac Enzymes:  Recent Labs  08/05/15 2200 08/06/15 0300  TROPONINI 0.10* 0.10*    Coagulation Studies: No results for input(s): LABPROT, INR in the last 72 hours.  Other: Invalid input(s): POCBNP No results for input(s): DDIMER in the last 72 hours.  Recent Labs  08/05/15 2200  HGBA1C 7.4*    Recent Labs  08/06/15 0940  CHOL 228*  HDL 43  LDLCALC 156*  TRIG 147  CHOLHDL 5.3   No results for input(s): TSH, T4TOTAL, T3FREE, THYROIDAB in the last 72 hours.  Invalid input(s): FREET3 No results for input(s): VITAMINB12, FOLATE, FERRITIN, TIBC, IRON, RETICCTPCT in the last 72 hours.   Other results:  EKG  ( personally reviewed )  - NSR at 68.   Medications:    Infusions: . dextrose 60 mL/hr at 08/06/15 2034    Scheduled Medications: . antiseptic oral rinse  7 mL Mouth Rinse q12n4p  . aspirin  325 mg Oral Daily  . brimonidine  1 drop Right Eye Q12H   And  . timolol  1 drop Right Eye Q12H  . carvedilol  12.5 mg Oral BID WC  . chlorhexidine  15 mL Mouth Rinse BID  . heparin  5,000 Units Subcutaneous 3 times per day  . hydrALAZINE  50 mg Oral 3 times per day  . insulin aspart  0-20 Units Subcutaneous TID WC  . insulin aspart  0-5 Units Subcutaneous QHS  . insulin detemir  7 Units Subcutaneous QHS  . isosorbide mononitrate  30 mg Oral Daily  . magnesium sulfate 1 - 4 g bolus IVPB  3 g Intravenous Once  .  pneumococcal 23 valent vaccine  0.5 mL Intramuscular Tomorrow-1000  . simvastatin  20 mg Oral q1800  . sodium chloride  3 mL Intravenous Q12H    Assessment/ Plan:   Principal Problem:   CHF, acute on chronic (HCC) Active Problems:   Diabetes mellitus due to abnormal insulin (HCC)   Hyperlipidemia   CAD (coronary artery disease)   Obesity   Anemia   Essential hypertension   Acute renal failure superimposed on stage 3 chronic kidney disease (HCC)   Elevated troponin   CHF (congestive heart failure) (HCC)   Acute on chronic heart failure (HCC)   Somnolence   Acute respiratory failure with hypercapnia (HCC)   Respiratory acidosis   Hypernatremia   Systolic and diastolic CHF, acute ([place])  1.  Chronic combined systolic and diastolic CHF :   Echo shows mild LV dysfunction with EF [id number], grade 1 diastolic dysfunction Mild MR, moderate TR, moderate pulmonary HTN with estimated PA pressure of 45.  She was taking lasix at home only as needed.   She probably needs to take it daily .   Alternatively, she may do better with Torsemide since she has significant renal insufficiency   2. Pulmonary HTN:   Continue diuresis  3. Respiratory failure:   She has CO2 retention with acidosis which has improved with BIPAP and diurusis.    4. Acute on chronic kidney disease stage IV  Creatinine is slighly better today with diuresis.  Further management per IM   Disposition:  Length of Stay: 2  Ramond Dial., MD, [facility] 08/07/2015, 8:24 AM Office [phone] Pager [phone]

## 2015-08-07 NOTE — Progress Notes (Signed)
Shift event note:  Notified by RN initially at approx 2300 regarding pt w/ increased lethargy on BiPAP. RN reported pt would open eyes briefly to firm sternal rub but would not stay awake. States pt was noted to be somewhat sleepy in ED and was placed on BiPAP at approx 1630 after ABG in ED revealed pH-7.2 and pC02 5f 64.2. Orders given for repeat ABG. Was called w/ results > pH-7.16, pC02-69.3, p02-132 and bicarb of 25.1. After results of ABG RRT increased inspired rate on BiPAP and by the time I arrived at bedside pt was more responsive. Pt opened eyes to voice and otherwise appeared comfortable on BiPAP. BBS diminished but otherwise CTA. Remaining VSS.  Assessment/Plan: 1. Acute hypercapneic respiratory failure: In clinical setting of CHF exacerbation. ABG worsening on BiPAP though LOC improved w/ adjustments to rate on BiPAP. Inclined to observe pt fo2-3 hours since somewhat improved after rate adjustment.  Discussed pt w/ Dr Jimmy Footman w/ [facility] who agreed w/ plan to observe on BiPAP and repeat ABG in 2 hours. Will continue to monitor closely in SDU pending repeat ABG in 2 hours.  Jeryl Columbia, NP-C [facility] Pager [phone]  Follow-up:  0400: Repeat ABG slightly improved. Pt remains responsive to voice and tolerating BiPAP. Will repeat ABG at 0700.

## 2015-08-07 NOTE — Care Management Important Message (Signed)
Important Message  Patient Details  Name: Kathleen [name] MRN: [id number] Date of Birth: [birth year]   Medicare Important Message Given:  Yes    Lando Alcalde P Aurel Nguyen 08/07/2015, 2:01 PM

## 2015-08-07 NOTE — Progress Notes (Signed)
[place] TEAM 1 - Stepdown/ICU TEAM Progress Note  Kathleen [name] [id number] DOB: 12/02/1945 DOA: 08/05/2015 PCP: Benito Mccreedy, MD  Admit HPI / Brief Narrative: 69 y.o. BF PMHx HTN, DM Type 2, HLD, CKD stage III, CAD native artery, Chronic Combined CHF (01/2014 EF 25-30% by echo),   Presents the emergency department with chief complaint of gradual worsening shortness of breath, increased lower extremity edema and worsening orthopnea. Initial evaluation reveals acute on chronic combined heart failure.  Information is obtained from the patient and the daughter who is at the bedside. Patient reports a gradual worsening shortness of breath of the last 4 weeks. Should symptoms include sleep lower extremity edema and orthopnea. She reports that she has Lasix on her home medication list and she took some yesterday and the day before but she does not take daily. She denies chest pain palpitations headache dizziness syncope or near-syncope. She denies abdominal pain nausea vomiting. She denies dysuria hematuria ferequency or urgency.  In the emergency department she is afebrile hemodynamically stable oxygen saturation level 90% on room air. She is placed on 2 L nasal cannula.   HPI/Subjective: 12/ 30  A/O x 4. States does not weigh Daily and does not know [employer].  States has not seen cardiologist in significant amount of time, not keeping track of daily weight.   Assessment/Plan: AMS -Most likely secondary to hypercapnia; resolved  Acute REspiratory Failure w/ Hypercapnia/Respiratory Acidosis -Continue BiPAP PRN, currently on 2 L O2 via  doing well - goal SPO2> 90%  Pulmonary Vascular Congestion/Lt Pleural Effusion? -Lasix IV 60 mg 1 -See CHF  Acute on chronic renal failure stage III -Improving with diuresis    Acute Systolic and diastolic CHF - Q000111Q echocardiogram see results below  -Strict I&O since admission; -1.5 L -Daily a.m. Weight -Per cardiology recommendation DC  Lasix and changed to Torsemide 20 mg daily  Hypernatremia -D5W KVO  HLD -Start Lipitor 20 mg daily  Noncompliance with medication -Patient not following with cardiologist as required. Counseled patient on sequela of continuing to be noncompliant to include death -PT/OT consult CIR vs SNF     Code Status: FULL Family Communication: no family present at time of exam Disposition Plan: Resolution altered mental status    Consultants: NA  Procedure/Significant Events: 12/29 echocardiogram;- Left ventricle: severeconcentric hypertrophy. -LVEF= 40% to 45%. - (grade 1diastolic dysfunction). - Tricuspid valve: moderate regurgitation. - Pulmonary arteries: PA peak pressure: 45 mm Hg (S).    Culture 12/28 MRSA by PCR negative  Antibiotics: NA  DVT prophylaxis: Subcutaneous heparin   Devices    LINES / TUBES:      Continuous Infusions: . dextrose 60 mL/hr at 08/06/15 2034    Objective: VITAL SIGNS: Temp: 99.2 F (37.3 C) (12/30 1237) Temp Source: Oral (12/30 1237) BP: 102/53 mmHg (12/30 1400) Pulse Rate: 73 (12/30 1400) BiPAP Set RR; 18 IPAP; 17 EPAP; 5 O2; 30%    Intake/Output Summary (Last 24 hours) at 08/07/15 1501 Last data filed at 08/07/15 1400  Gross per 24 hour  Intake 1759.33 ml  Output   2350 ml  Net -590.67 ml     Exam: General:  A/O x 4, negative acute respiratory distress Eyes: Negative headache, eye pain, double vision,negative scleral hemorrhage ENT: Negative Runny nose, negative ear pain, negative gingival bleeding, Neck:  Negative scars, masses, torticollis, lymphadenopathy, JVD Lungs:  clear to auscultation bilateral,  negative  wheezes or crackles Cardiovascular: Regular rate and rhythm without murmur gallop or rub normal S1  and S2 Abdomen:negative abdominal pain, nondistended, positive soft, bowel sounds, no rebound, no ascites, no appreciable mass Extremities: No significant cyanosis, clubbing, or edema bilateral lower  extremities Psychiatric:  Negative depression, negative anxiety, negative fatigue, negative mania  Neurologic:  Cranial nerves II through XII intact, tongue/uvula midline, all extremities muscle strength 5/5, sensation intact throughout,  negative dysarthria, negative expressive aphasia, negative receptive aphasia.     Data Reviewed: Basic Metabolic Panel:  Recent Labs Lab 08/05/15 1130 08/06/15 0300 08/06/15 0940 08/07/15 0505  NA 146* 149* 148* 147*  K 5.1 5.3* 5.2* 4.8  CL 111 112* 113* 110  CO2 26 25 24 26   GLUCOSE 112* 109* 76 106*  BUN 57* 58* 60* 57*  CREATININE 3.78* 3.80* 3.82* 3.60*  CALCIUM 6.6* 6.9* 7.0* 7.1*  MG  --   --  1.6* 1.6*   Liver Function Tests:  Recent Labs Lab 08/06/15 0940 08/07/15 0505  AST 17 12*  ALT 14 11*  ALKPHOS 82 75  BILITOT 0.4 0.7  PROT 5.1* 5.2*  ALBUMIN 2.3* 2.1*   No results for input(s): LIPASE, AMYLASE in the last 168 hours.  Recent Labs Lab 08/05/15 1603  AMMONIA 15   CBC:  Recent Labs Lab 08/05/15 1130 08/06/15 1140 08/07/15 0505  WBC 5.2 4.3 4.3  NEUTROABS 3.5 2.7 2.6  HGB 9.7* 9.7* 9.2*  HCT 32.8* 33.9* 31.8*  MCV 88.4 91.6 88.1  PLT 217 221 260   Cardiac Enzymes:  Recent Labs Lab 08/05/15 2200 08/06/15 0300  TROPONINI 0.10* 0.10*   BNP (last 3 results)  Recent Labs  08/05/15 1130  BNP 892.4*    ProBNP (last 3 results) No results for input(s): PROBNP in the last 8760 hours.  CBG:  Recent Labs Lab 08/06/15 1043 08/06/15 1151 08/06/15 2148 08/07/15 0806 08/07/15 1237  GLUCAP 103* 82 115* 87 163*    Recent Results (from the past 240 hour(s))  MRSA PCR Screening     Status: None   Collection Time: 08/05/15  6:53 PM  Result Value Ref Range Status   MRSA by PCR NEGATIVE NEGATIVE Final    Comment:        The GeneXpert MRSA Assay (FDA approved for NASAL specimens only), is one component of a comprehensive MRSA colonization surveillance program. It is not intended to diagnose  MRSA infection nor to guide or monitor treatment for MRSA infections.      Studies:  Recent x-ray studies have been reviewed in detail by the Attending Physician  Scheduled Meds:  Scheduled Meds: . antiseptic oral rinse  7 mL Mouth Rinse q12n4p  . aspirin  325 mg Oral Daily  . brimonidine  1 drop Right Eye Q12H   And  . timolol  1 drop Right Eye Q12H  . carvedilol  12.5 mg Oral BID WC  . chlorhexidine  15 mL Mouth Rinse BID  . heparin  5,000 Units Subcutaneous 3 times per day  . hydrALAZINE  50 mg Oral 3 times per day  . insulin aspart  0-20 Units Subcutaneous TID WC  . insulin aspart  0-5 Units Subcutaneous QHS  . insulin detemir  7 Units Subcutaneous QHS  . isosorbide mononitrate  30 mg Oral Daily  . pneumococcal 23 valent vaccine  0.5 mL Intramuscular Tomorrow-1000  . simvastatin  20 mg Oral q1800  . sodium chloride  3 mL Intravenous Q12H    Time spent on care of this patient: 40 mins   [name], Kathleen Docker , MD  [facility]  Office  [phone] Pager - [phone]  On-Call/Text Page:      [email or web address]      password [email or web address]  If 7PM-7AM, please contact night-coverage [email or web address] Password [email or web address] 08/07/2015, 3:01 PM   LOS: 2 days   Care during the described time interval was provided by me .  I have reviewed this patient's available data, including medical history, events of note, physical examination, and all test results as part of my evaluation. I have personally reviewed and interpreted all radiology studies.   Kathleen Crawford, MD [phone] Pager

## 2015-08-08 DIAGNOSIS — I2510 Atherosclerotic heart disease of native coronary artery without angina pectoris: Secondary | ICD-10-CM

## 2015-08-08 DIAGNOSIS — I130 Hypertensive heart and chronic kidney disease with heart failure and stage 1 through stage 4 chronic kidney disease, or unspecified chronic kidney disease: Secondary | ICD-10-CM | POA: Diagnosis not present

## 2015-08-08 DIAGNOSIS — Z9114 Patient's other noncompliance with medication regimen: Secondary | ICD-10-CM | POA: Diagnosis present

## 2015-08-08 DIAGNOSIS — N189 Chronic kidney disease, unspecified: Secondary | ICD-10-CM

## 2015-08-08 DIAGNOSIS — R7989 Other specified abnormal findings of blood chemistry: Secondary | ICD-10-CM

## 2015-08-08 DIAGNOSIS — J948 Other specified pleural conditions: Secondary | ICD-10-CM

## 2015-08-08 DIAGNOSIS — E785 Hyperlipidemia, unspecified: Secondary | ICD-10-CM

## 2015-08-08 DIAGNOSIS — R0989 Other specified symptoms and signs involving the circulatory and respiratory systems: Secondary | ICD-10-CM | POA: Diagnosis present

## 2015-08-08 DIAGNOSIS — J90 Pleural effusion, not elsewhere classified: Secondary | ICD-10-CM | POA: Diagnosis present

## 2015-08-08 DIAGNOSIS — I10 Essential (primary) hypertension: Secondary | ICD-10-CM

## 2015-08-08 DIAGNOSIS — N179 Acute kidney failure, unspecified: Secondary | ICD-10-CM | POA: Diagnosis present

## 2015-08-08 LAB — GLUCOSE, CAPILLARY
GLUCOSE-CAPILLARY: 211 mg/dL — AB (ref 65–99)
Glucose-Capillary: 105 mg/dL — ABNORMAL HIGH (ref 65–99)
Glucose-Capillary: 193 mg/dL — ABNORMAL HIGH (ref 65–99)

## 2015-08-08 LAB — CBC WITH DIFFERENTIAL/PLATELET
Basophils Absolute: 0 10*3/uL (ref 0.0–0.1)
Basophils Relative: 0 %
Eosinophils Absolute: 0.3 10*3/uL (ref 0.0–0.7)
Eosinophils Relative: 6 %
HEMATOCRIT: 31.7 % — AB (ref 36.0–46.0)
HEMOGLOBIN: 9 g/dL — AB (ref 12.0–15.0)
LYMPHS ABS: 1 10*3/uL (ref 0.7–4.0)
Lymphocytes Relative: 18 %
MCH: 25.2 pg — AB (ref 26.0–34.0)
MCHC: 28.4 g/dL — AB (ref 30.0–36.0)
MCV: 88.8 fL (ref 78.0–100.0)
MONOS PCT: 13 %
Monocytes Absolute: 0.7 10*3/uL (ref 0.1–1.0)
NEUTROS ABS: 3.4 10*3/uL (ref 1.7–7.7)
NEUTROS PCT: 63 %
Platelets: 209 10*3/uL (ref 150–400)
RBC: 3.57 MIL/uL — ABNORMAL LOW (ref 3.87–5.11)
RDW: 16.8 % — ABNORMAL HIGH (ref 11.5–15.5)
WBC: 5.5 10*3/uL (ref 4.0–10.5)

## 2015-08-08 LAB — COMPREHENSIVE METABOLIC PANEL
ALT: 10 U/L — AB (ref 14–54)
AST: 12 U/L — AB (ref 15–41)
Albumin: 2.1 g/dL — ABNORMAL LOW (ref 3.5–5.0)
Alkaline Phosphatase: 72 U/L (ref 38–126)
Anion gap: 9 (ref 5–15)
BILIRUBIN TOTAL: 0.4 mg/dL (ref 0.3–1.2)
BUN: 64 mg/dL — AB (ref 6–20)
CHLORIDE: 107 mmol/L (ref 101–111)
CO2: 27 mmol/L (ref 22–32)
CREATININE: 3.73 mg/dL — AB (ref 0.44–1.00)
Calcium: 6.9 mg/dL — ABNORMAL LOW (ref 8.9–10.3)
GFR, EST AFRICAN AMERICAN: 13 mL/min — AB (ref >60)
GFR, EST NON AFRICAN AMERICAN: 11 mL/min — AB (ref >60)
Glucose, Bld: 178 mg/dL — ABNORMAL HIGH (ref 65–99)
Potassium: 5 mmol/L (ref 3.5–5.1)
Sodium: 143 mmol/L (ref 135–145)
Total Protein: 5.1 g/dL — ABNORMAL LOW (ref 6.5–8.1)

## 2015-08-08 LAB — HEMOGLOBIN A1C
HEMOGLOBIN A1C: 7.3 % — AB (ref 4.8–5.6)
Mean Plasma Glucose: 163 mg/dL

## 2015-08-08 LAB — MAGNESIUM: Magnesium: 2.1 mg/dL (ref 1.7–2.4)

## 2015-08-08 MED ORDER — TORSEMIDE 20 MG PO TABS
10.0000 mg | ORAL_TABLET | Freq: Every day | ORAL | Status: DC
  Administered 2015-08-09: 10 mg via ORAL
  Filled 2015-08-08: qty 1

## 2015-08-08 NOTE — Progress Notes (Signed)
[name] TEAM 1 - Stepdown/ICU TEAM Progress Note  Kathleen [name] [id number] DOB: Apr 23, 1946 DOA: 08/05/2015 PCP: Benito Mccreedy, MD  Admit HPI / Brief Narrative: 69 y.o. BF PMHx HTN, DM Type 2, HLD, CKD stage III, CAD native artery, Chronic Combined CHF (01/2014 EF 25-30% by echo),   Presents the emergency department with chief complaint of gradual worsening shortness of breath, increased lower extremity edema and worsening orthopnea. Initial evaluation reveals acute on chronic combined heart failure.  Information is obtained from the patient and the daughter who is at the bedside. Patient reports a gradual worsening shortness of breath of the last 4 weeks. Should symptoms include sleep lower extremity edema and orthopnea. She reports that she has Lasix on her home medication list and she took some yesterday and the day before but she does not take daily. She denies chest pain palpitations headache dizziness syncope or near-syncope. She denies abdominal pain nausea vomiting. She denies dysuria hematuria ferequency or urgency.  In the emergency department she is afebrile hemodynamically stable oxygen saturation level 90% on room air. She is placed on 2 L nasal cannula.   HPI/Subjective: 12/ 31  A/O x 4. States does not weigh Daily and does not know [employer].  States has not seen cardiologist in significant amount of time, not keeping track of daily weight. States has not ambulated or set chair today. States uses walker for ambulation home   Assessment/Plan: AMS -Most likely secondary to hypercapnia; resolved -Ambulate patient using walker -Obtain ambulatory SPO2 and record in appropriate Epic form  Acute REspiratory Failure w/ Hypercapnia/Respiratory Acidosis -Continue BiPAP PRN, currently on 2 L O2 via  doing well - goal SPO2> 90%  Pulmonary Vascular Congestion/Lt Pleural Effusion? -See CHF  Acute on chronic renal failure stage III(Baseline ~2.5) -Improving with diuresis    -Patient's renal failure has worsened over the last year consulted nephrology; may be moving toward HD  Acute Systolic and diastolic CHF - [id number] echocardiogram see results below  -Strict I&O since admission; -1.84 L -Daily a.m. Admission weight= 93 kg         12/31 bed weight= 92.7 kg -Decrease Torsemide 10 mg daily, 2dary increasing Cr   Hypernatremia -D5W KVO  HLD -Continue Lipitor 20 mg daily  Noncompliance with medication -Patient not following with cardiologist as required. Counseled patient on sequela of continuing to be noncompliant to include death -PT/OT consult CIR vs SNF      Code Status: FULL Family Communication: no family present at time of exam Disposition Plan: Resolution altered mental status    Consultants: NA  Procedure/Significant Events: 12/29 echocardiogram;- Left ventricle: severeconcentric hypertrophy. -LVEF= 40% to 45%. - (grade 1diastolic dysfunction). - Tricuspid valve: moderate regurgitation. - Pulmonary arteries: PA peak pressure: 45 mm Hg (S).    Culture 12/28 MRSA by PCR negative  Antibiotics: NA  DVT prophylaxis: Subcutaneous heparin   Devices    LINES / TUBES:      Continuous Infusions: . dextrose Stopped (08/07/15 1800)    Objective: VITAL SIGNS: Temp: 98 F (36.7 C) (12/31 1150) Temp Source: Oral (12/31 1150) BP: 113/48 mmHg (12/31 1150) Pulse Rate: 69 (12/31 1150) BiPAP Set RR; 18 IPAP; 17 EPAP; 5 O2; 30%    Intake/Output Summary (Last 24 hours) at 08/08/15 1535 Last data filed at 08/08/15 0500  Gross per 24 hour  Intake 484.17 ml  Output    900 ml  Net -415.83 ml     Exam: General:  A/O x 4, negative acute respiratory  distress Eyes: Negative headache, eye pain, double vision,negative scleral hemorrhage ENT: Negative Runny nose, negative ear pain, negative gingival bleeding, Neck:  Negative scars, masses, torticollis, lymphadenopathy, JVD Lungs:  clear to auscultation bilateral,  negative   wheezes or crackles Cardiovascular: Regular rate and rhythm without murmur gallop or rub normal S1 and S2 Abdomen:negative abdominal pain, nondistended, positive soft, bowel sounds, no rebound, no ascites, no appreciable mass Extremities: No significant cyanosis, clubbing, or edema bilateral lower extremities Psychiatric:  Negative depression, negative anxiety, negative fatigue, negative mania  Neurologic:  Cranial nerves II through XII intact, tongue/uvula midline, all extremities muscle strength 5/5, sensation intact throughout,  negative dysarthria, negative expressive aphasia, negative receptive aphasia.     Data Reviewed: Basic Metabolic Panel:  Recent Labs Lab 08/05/15 1130 08/06/15 0300 08/06/15 0940 08/07/15 0505 08/08/15 0324  NA 146* 149* 148* 147* 143  K 5.1 5.3* 5.2* 4.8 5.0  CL 111 112* 113* 110 107  CO2 26 25 24 26 27   GLUCOSE 112* 109* 76 106* 178*  BUN 57* 58* 60* 57* 64*  CREATININE 3.78* 3.80* 3.82* 3.60* 3.73*  CALCIUM 6.6* 6.9* 7.0* 7.1* 6.9*  MG  --   --  1.6* 1.6* 2.1   Liver Function Tests:  Recent Labs Lab 08/06/15 0940 08/07/15 0505 08/08/15 0324  AST 17 12* 12*  ALT 14 11* 10*  ALKPHOS 82 75 72  BILITOT 0.4 0.7 0.4  PROT 5.1* 5.2* 5.1*  ALBUMIN 2.3* 2.1* 2.1*   No results for input(s): LIPASE, AMYLASE in the last 168 hours.  Recent Labs Lab 08/05/15 1603  AMMONIA 15   CBC:  Recent Labs Lab 08/05/15 1130 08/06/15 1140 08/07/15 0505 08/08/15 0324  WBC 5.2 4.3 4.3 5.5  NEUTROABS 3.5 2.7 2.6 3.4  HGB 9.7* 9.7* 9.2* 9.0*  HCT 32.8* 33.9* 31.8* 31.7*  MCV 88.4 91.6 88.1 88.8  PLT 217 221 260 209   Cardiac Enzymes:  Recent Labs Lab 08/05/15 2200 08/06/15 0300  TROPONINI 0.10* 0.10*   BNP (last 3 results)  Recent Labs  08/05/15 1130  BNP 892.4*    ProBNP (last 3 results) No results for input(s): PROBNP in the last 8760 hours.  CBG:  Recent Labs Lab 08/07/15 1237 08/07/15 1654 08/07/15 2108 08/08/15 0755  08/08/15 1153  GLUCAP 163* 157* 161* 105* 193*    Recent Results (from the past 240 hour(s))  MRSA PCR Screening     Status: None   Collection Time: 08/05/15  6:53 PM  Result Value Ref Range Status   MRSA by PCR NEGATIVE NEGATIVE Final    Comment:        The GeneXpert MRSA Assay (FDA approved for NASAL specimens only), is one component of a comprehensive MRSA colonization surveillance program. It is not intended to diagnose MRSA infection nor to guide or monitor treatment for MRSA infections.      Studies:  Recent x-ray studies have been reviewed in detail by the Attending Physician  Scheduled Meds:  Scheduled Meds: . antiseptic oral rinse  7 mL Mouth Rinse BID  . aspirin  325 mg Oral Daily  . atorvastatin  20 mg Oral q1800  . brimonidine  1 drop Right Eye Q12H   And  . timolol  1 drop Right Eye Q12H  . carvedilol  12.5 mg Oral BID WC  . heparin  5,000 Units Subcutaneous 3 times per day  . hydrALAZINE  50 mg Oral 3 times per day  . insulin aspart  0-20 Units Subcutaneous TID  WC  . insulin aspart  0-5 Units Subcutaneous QHS  . insulin detemir  7 Units Subcutaneous QHS  . isosorbide mononitrate  30 mg Oral Daily  . sodium chloride  3 mL Intravenous Q12H  . [START ON 08/09/2015] torsemide  10 mg Oral Daily    Time spent on care of this patient: 40 mins   Jordyan Hardiman, Geraldo Docker , MD  [facility] Office  [phone] Pager - [phone]  On-Call/Text Page:      [email or web address]      password [email or web address]  If 7PM-7AM, please contact night-coverage [email or web address] Password [email or web address] 08/08/2015, 3:35 PM   LOS: 3 days   Care during the described time interval was provided by me .  I have reviewed this patient's available data, including medical history, events of note, physical examination, and all test results as part of my evaluation. I have personally reviewed and interpreted all radiology studies.   Dia Crawford, MD [phone] Pager

## 2015-08-08 NOTE — Progress Notes (Signed)
PROGRESS NOTE  Subjective:    69 y.o. female with a history of HTN, DM, HLD, CKD (06/12/14 Baseline Cr 2.2 ), carotid artery disease, tobacco abuse (quit 3 months ago), dementia and chronic mixed S/D CHF (EF recently improved) Admitted to [facility] with altered mental status and a fall.  Has been found to have acute worsening of her renal function   She is much more awake today, off BiPAP since yesterday, states that she still feels SOB but it has improved since the admission.  Objective:    Vital Signs:   Temp:  [98.4 F (36.9 C)-99.2 F (37.3 C)] 98.4 F (36.9 C) (12/31 0752) Pulse Rate:  [67-81] 73 (12/31 0752) Resp:  [16-25] 16 (12/31 0752) BP: (102-139)/(41-67) 139/50 mmHg (12/31 0752) SpO2:  [95 %-99 %] 98 % (12/31 0752) Weight:  [204 lb 5.9 oz (92.7 kg)] 204 lb 5.9 oz (92.7 kg) (12/31 0407)  Last BM Date: 08/07/15   24-hour weight change: Weight change: -5 lb 8.2 oz (-2.5 kg)  Weight trends: Filed Weights   08/06/15 0500 08/07/15 0500 08/08/15 0407  Weight: 207 lb 14.3 oz (94.3 kg) 209 lb 14.1 oz (95.2 kg) 204 lb 5.9 oz (92.7 kg)   Intake/Output:  12/30 0701 - 12/31 0700 In: 1564.2 [P.O.:1020; I.V.:544.2] Out: 900 [Urine:900]   Physical Exam: BP 139/50 mmHg  Pulse 73  Temp(Src) 98.4 F (36.9 C) (Oral)  Resp 16  Ht 5\' 3"  (1.6 m)  Wt 204 lb 5.9 oz (92.7 kg)  BMI 36.21 kg/m2  SpO2 98%  Wt Readings from Last 3 Encounters:  08/08/15 204 lb 5.9 oz (92.7 kg)  05/07/15 200 lb (90.719 kg)  11/20/14 191 lb 12.8 oz (87 kg)    General: Vital signs reviewed and noted.   Head: Normocephalic, atraumatic.  Eyes: conjunctivae/corneas clear.  EOM's intact.   Throat: normal  Neck:  normal   Lungs:    few rales, some consolidation in right base   Heart:  RR  Abdomen:  Soft, non-tender, non-distended    Extremities: No edema    Neurologic: A&O X3, CN II - XII are grossly intact. ,    Psych: Normal     Labs: BMET:  Recent Labs  08/07/15 0505  08/08/15 0324  NA 147* 143  K 4.8 5.0  CL 110 107  CO2 26 27  GLUCOSE 106* 178*  BUN 57* 64*  CREATININE 3.60* 3.73*  CALCIUM 7.1* 6.9*  MG 1.6* 2.1    Liver function tests:  Recent Labs  08/07/15 0505 08/08/15 0324  AST 12* 12*  ALT 11* 10*  ALKPHOS 75 72  BILITOT 0.7 0.4  PROT 5.2* 5.1*  ALBUMIN 2.1* 2.1*   CBC:  Recent Labs  08/07/15 0505 08/08/15 0324  WBC 4.3 5.5  NEUTROABS 2.6 3.4  HGB 9.2* 9.0*  HCT 31.8* 31.7*  MCV 88.1 88.8  PLT 260 209   Cardiac Enzymes:  Recent Labs  08/05/15 2200 08/06/15 0300  TROPONINI 0.10* 0.10*    Recent Labs  08/06/15 1140  HGBA1C 7.3*    Recent Labs  08/06/15 0940  CHOL 228*  HDL 43  LDLCALC 156*  TRIG 147  CHOLHDL 5.3    Other results:  EKG  ( personally reviewed )  - NSR at 68.   Medications:    Infusions: . dextrose Stopped (08/07/15 1800)    Scheduled Medications: . antiseptic oral rinse  7 mL Mouth Rinse BID  . aspirin  325 mg Oral  Daily  . atorvastatin  20 mg Oral q1800  . brimonidine  1 drop Right Eye Q12H   And  . timolol  1 drop Right Eye Q12H  . carvedilol  12.5 mg Oral BID WC  . heparin  5,000 Units Subcutaneous 3 times per day  . hydrALAZINE  50 mg Oral 3 times per day  . insulin aspart  0-20 Units Subcutaneous TID WC  . insulin aspart  0-5 Units Subcutaneous QHS  . insulin detemir  7 Units Subcutaneous QHS  . isosorbide mononitrate  30 mg Oral Daily  . pneumococcal 23 valent vaccine  0.5 mL Intramuscular Tomorrow-1000  . sodium chloride  3 mL Intravenous Q12H  . torsemide  20 mg Oral Daily    Assessment/ Plan:   Principal Problem:   CHF, acute on chronic (HCC) Active Problems:   Diabetes mellitus due to abnormal insulin (HCC)   Hyperlipidemia   CAD (coronary artery disease)   Obesity   Anemia   Essential hypertension   Acute renal failure superimposed on stage 3 chronic kidney disease (HCC)   Elevated troponin   CHF (congestive heart failure) (HCC)   Acute on  chronic heart failure (HCC)   Somnolence   Acute respiratory failure with hypercapnia (HCC)   Respiratory acidosis   Hypernatremia   Systolic and diastolic CHF, acute ([place])   Disorientation  1.  Chronic combined systolic and diastolic CHF :   Echo shows mild LV dysfunction with EF [id number], grade 1 diastolic dysfunction Mild MR, moderate TR, moderate pulmonary HTN with estimated PA pressure of 45.  She was taking lasix at home only as needed.   She probably needs to take it daily .   Now on Torsemide. Negative weight and I&O, increasing Crea, I would continue the same dose of torsemide, today 204 lbs, baseline 191.   2. Pulmonary HTN:   Continue diuresis  3. Respiratory failure:   She has CO2 retention with acidosis which has improved with BIPAP and diurusis.    4. Acute on chronic kidney disease stage IV  Creatinine is slighly worse today with diuresis.  Further management per IM   Disposition:  Length of Stay: 3  Dorothy Spark, MD, [facility] 08/08/2015, 9:54 AM Office [phone] Pager [phone]

## 2015-08-08 NOTE — Plan of Care (Signed)
Problem: Activity: Goal: Capacity to carry out activities will improve Outcome: Not Progressing Patient may benefit from physical therapy. Patient with little movement and stiffness.

## 2015-08-09 DIAGNOSIS — N179 Acute kidney failure, unspecified: Secondary | ICD-10-CM

## 2015-08-09 DIAGNOSIS — N183 Chronic kidney disease, stage 3 (moderate): Secondary | ICD-10-CM

## 2015-08-09 DIAGNOSIS — J9602 Acute respiratory failure with hypercapnia: Secondary | ICD-10-CM

## 2015-08-09 LAB — CBC WITH DIFFERENTIAL/PLATELET
Basophils Absolute: 0 10*3/uL (ref 0.0–0.1)
Basophils Relative: 0 %
EOS ABS: 0.4 10*3/uL (ref 0.0–0.7)
EOS PCT: 7 %
HCT: 34.2 % — ABNORMAL LOW (ref 36.0–46.0)
Hemoglobin: 9.8 g/dL — ABNORMAL LOW (ref 12.0–15.0)
LYMPHS ABS: 0.9 10*3/uL (ref 0.7–4.0)
Lymphocytes Relative: 15 %
MCH: 25.7 pg — AB (ref 26.0–34.0)
MCHC: 28.7 g/dL — AB (ref 30.0–36.0)
MCV: 89.5 fL (ref 78.0–100.0)
MONO ABS: 0.8 10*3/uL (ref 0.1–1.0)
MONOS PCT: 14 %
Neutro Abs: 3.7 10*3/uL (ref 1.7–7.7)
Neutrophils Relative %: 64 %
PLATELETS: 217 10*3/uL (ref 150–400)
RBC: 3.82 MIL/uL — ABNORMAL LOW (ref 3.87–5.11)
RDW: 16.6 % — AB (ref 11.5–15.5)
WBC: 5.9 10*3/uL (ref 4.0–10.5)

## 2015-08-09 LAB — COMPREHENSIVE METABOLIC PANEL
ALBUMIN: 2.1 g/dL — AB (ref 3.5–5.0)
ALK PHOS: 80 U/L (ref 38–126)
ALT: 10 U/L — AB (ref 14–54)
AST: 14 U/L — ABNORMAL LOW (ref 15–41)
Anion gap: 10 (ref 5–15)
BILIRUBIN TOTAL: 0.5 mg/dL (ref 0.3–1.2)
BUN: 70 mg/dL — ABNORMAL HIGH (ref 6–20)
CALCIUM: 7.2 mg/dL — AB (ref 8.9–10.3)
CO2: 27 mmol/L (ref 22–32)
CREATININE: 3.81 mg/dL — AB (ref 0.44–1.00)
Chloride: 102 mmol/L (ref 101–111)
GFR calc Af Amer: 13 mL/min — ABNORMAL LOW (ref >60)
GFR calc non Af Amer: 11 mL/min — ABNORMAL LOW (ref >60)
GLUCOSE: 143 mg/dL — AB (ref 65–99)
Potassium: 5.4 mmol/L — ABNORMAL HIGH (ref 3.5–5.1)
SODIUM: 139 mmol/L (ref 135–145)
TOTAL PROTEIN: 5.6 g/dL — AB (ref 6.5–8.1)

## 2015-08-09 LAB — GLUCOSE, CAPILLARY
GLUCOSE-CAPILLARY: 116 mg/dL — AB (ref 65–99)
GLUCOSE-CAPILLARY: 195 mg/dL — AB (ref 65–99)
GLUCOSE-CAPILLARY: 211 mg/dL — AB (ref 65–99)
Glucose-Capillary: 172 mg/dL — ABNORMAL HIGH (ref 65–99)

## 2015-08-09 LAB — MAGNESIUM: Magnesium: 2 mg/dL (ref 1.7–2.4)

## 2015-08-09 MED ORDER — FUROSEMIDE 10 MG/ML IJ SOLN
80.0000 mg | Freq: Three times a day (TID) | INTRAMUSCULAR | Status: DC
  Administered 2015-08-09 – 2015-08-10 (×2): 80 mg via INTRAVENOUS
  Filled 2015-08-09 (×2): qty 8

## 2015-08-09 NOTE — Evaluation (Signed)
Physical Therapy Evaluation Patient Details Name: Kathleen [name] MRN: [id number] DOB: Aug 25, 1945 Today's Date: 08/09/2015   History of Present Illness  pt presents with Acute on Chronic CHF, a Fall, and AMS.  pt with hx of HTN, DM, CKD, CAD, CHF, and Dementia.    Clinical Impression  Pt very pleasant, but very drowsy.  Pt does attempt to A with mobility, but requires extensive A due to weakness and drowsiness.  Feel pt would benefit from SNF level of care at D/C.  Will continue to follow.      Follow Up Recommendations SNF    Equipment Recommendations  None recommended by PT    Recommendations for Other Services       Precautions / Restrictions Precautions Precautions: Fall Restrictions Weight Bearing Restrictions: No      Mobility  Bed Mobility Overal bed mobility: Needs Assistance;+2 for physical assistance Bed Mobility: Supine to Sit     Supine to sit: Max assist;+2 for physical assistance;HOB elevated     General bed mobility comments: cues for attending to taska nd safe technique.  pt does attempt to A, but limited ability.    Transfers Overall transfer level: Needs assistance Equipment used: 2 person hand held assist Transfers: Sit to/from [facility] Sit to Stand: Max assist;+2 physical assistance Stand pivot transfers: Total assist;+2 physical assistance       General transfer comment: pt requires extensive A for coming to stand and pivoting to chair.  Utilized pad under hips to A with power up to standing and completing pivot.    Ambulation/Gait                Stairs            Wheelchair Mobility    Modified Rankin (Stroke Patients Only)       Balance Overall balance assessment: Needs assistance Sitting-balance support: Bilateral upper extremity supported;Feet supported Sitting balance-Leahy Scale: Fair     Standing balance support: During functional activity Standing balance-Leahy Scale: Zero                                Pertinent Vitals/Pain Pain Assessment: Faces Faces Pain Scale: Hurts a little bit Pain Location: When coming to sitting pt grimaced and indicated she felt stiff all over. Pain Descriptors / Indicators: Grimacing Pain Intervention(s): Monitored during session;Repositioned;Limited activity within patient's tolerance    Home Living Family/patient expects to be discharged to:: Skilled nursing facility                      Prior Function           Comments: Unclear PLOF.       Hand Dominance        Extremity/Trunk Assessment   Upper Extremity Assessment: Defer to OT evaluation           Lower Extremity Assessment: Generalized weakness      Cervical / Trunk Assessment: Kyphotic  Communication   Communication: No difficulties  Cognition Arousal/Alertness: Lethargic Behavior During Therapy: Flat affect Overall Cognitive Status: Impaired/Different from baseline Area of Impairment: Orientation;Attention;Memory;Following commands;Safety/judgement;Awareness;Problem solving Orientation Level: Disoriented to;Situation Current Attention Level: Focused Memory: Decreased recall of precautions;Decreased short-term memory Following Commands: Follows one step commands inconsistently;Follows one step commands with increased time Safety/Judgement: Decreased awareness of safety;Decreased awareness of deficits Awareness: Intellectual Problem Solving: Slow processing;Decreased initiation;Difficulty sequencing;Requires verbal cues;Requires tactile cues General Comments: pt drowsy, but does  follow some simple directions with increased time.  pt maintains eyes closed through most of session despite cues to open eyes and attend to tasks.      General Comments      Exercises        Assessment/Plan    PT Assessment Patient needs continued PT services  PT Diagnosis Difficulty walking;Generalized weakness   PT Problem List Decreased  strength;Decreased activity tolerance;Decreased balance;Decreased mobility;Decreased coordination;Decreased cognition;Decreased knowledge of use of DME;Decreased safety awareness;Obesity  PT Treatment Interventions DME instruction;Gait training;Functional mobility training;Therapeutic activities;Therapeutic exercise;Balance training;Cognitive remediation;Patient/family education   PT Goals (Current goals can be found in the Care Plan section) Acute Rehab PT Goals Patient Stated Goal: pt unable to state.   PT Goal Formulation: Patient unable to participate in goal setting Time For Goal Achievement: 08/23/15 Potential to Achieve Goals: Good    Frequency Min 2X/week   Barriers to discharge        Co-evaluation               End of Session Equipment Utilized During Treatment: Gait belt;Oxygen Activity Tolerance: Patient limited by fatigue;Patient limited by lethargy Patient left: in chair;with call bell/phone within reach Nurse Communication: Mobility status;Need for lift equipment         Time: [id number] PT Time Calculation (min) (ACUTE ONLY): 18 min   Charges:   PT Evaluation $Initial PT Evaluation Tier I: 1 Procedure     PT G CodesCatarina Hartshorn, [place] [id number] 08/09/2015, 11:42 AM

## 2015-08-09 NOTE — Consult Note (Addendum)
Renal Service Consult Note [facility]  Kathleen [name] 08/09/2015 [name] D Requesting Physician: Dr Sherral Hammers PCP: Dr Vista Lawman  Reason for Consult:  Acute on CRF HPI: The patient is a 70 y.o. year-old with hx of HTN, DM2, CAD, CHF and CKD who presented on 12/28 with SOB for 2-3 weeks. Also AMS,+orthopnea, sig LE edema on admission. Hadn't seen PCP for several months. Was on lasix but not taking daily. Admitted w dx of a/c diast HF and started on IV lasix 80 bid. ECHO 2015 showed EF 25%. Bipap placed in ED.  Pt was confused felt due to hypercapnia. Diuresis caused improvement in MS and resp status and was weaned off bipap.   Seen by cardiology, new ECHO showed EF 40-45%, recommended to cont diuresis.    Creat higher than usual at 3.81.  Asked to see for CKD.  Patient does not have a nephrologist.    Date  Creat  eGFR  CKD 2010  1.09  51  Stage III 2015  1.8- 2.3 24- 31  Stage IV Sept 16 2.52  21       " Dec 28 3.78  13  Stage V Dec 30 3.60  Aug 09 2015 3.81  13  Stage V   Chart review: Jun 15 - BPPV, mixed isch/ non ischemic cardiomyopathy, HTN urgency, smoker, DM2 Hl, CKD 3, CAD, obesity, anemia Nov 15 - syncope, unclear cause; DM2, HTN, CKD 3   ROS  no nsaids  no kidney stones  no back pain  no dysuria  no CP  +orthopnea, better  Past Medical History  Past Medical History  Diagnosis Date  . Diabetes mellitus without complication ([place])   . CKD (chronic kidney disease), stage III     a. Borderline III-IV.  [name] Hypertension   . CAD (coronary artery disease)     a. Cath 01/2014: mild in LAD, mild-mod LCx, mod-severe RCA - for med rx.  . Systolic CHF ([place])     a. 01/2014: dx with mixed ICM/NICM (out of proportion to CAD) EF 25-30% by echo.   . Hyperlipidemia   . Anemia     a. 01/2014: suspected of chronic disease, negative FOBT.  [name] Meningioma ([place])     a. Incidental dx 01/2014.  . Obesity   . History of echocardiogram     Echo (10/15):  Mod LVH, EF  50-55%, Gr 1 DD, MAC, mild MR, mild TR, PASP 35 mmHg   Past Surgical History  Past Surgical History  Procedure Laterality Date  . Abdominal hysterectomy    . Breast surgery    . Tonsillectomy    . Left heart catheterization with coronary angiogram N/A 01/27/2014    Procedure: LEFT HEART CATHETERIZATION WITH CORONARY ANGIOGRAM;  Surgeon: Jettie Booze, MD;  Location: [facility] CATH LAB;  Service: Cardiovascular;  Laterality: N/A;   Family History  Family History  Problem Relation Age of Onset  . Diabetic kidney disease Mother   . Hypertension Mother   . Hypertension Father   . Heart failure Mother   . Heart attack Mother   . Hyperlipidemia Mother    Social History  reports that she has quit smoking. Her smoking use included Cigarettes. She has a 7.5 pack-year smoking history. She has never used smokeless tobacco. She reports that she does not drink alcohol or use illicit drugs. Allergies No Known Allergies Home medications Prior to Admission medications   Medication Sig Start Date End Date Taking? Authorizing Provider  aspirin 81  MG EC tablet Take 1 tablet (81 mg total) by mouth daily. 01/30/14  Yes Verlee Monte, MD  brimonidine-timolol (COMBIGAN) 0.2-0.5 % ophthalmic solution Place 1 drop into the right eye 2 (two) times daily as needed.    Yes Historical Provider, MD  carvedilol (COREG) 12.5 MG tablet Take 12.5 mg by mouth 2 (two) times daily with a meal.  09/30/14  Yes Historical Provider, MD  furosemide (LASIX) 40 MG tablet Take 40 mg by mouth daily as needed for fluid or edema.    Yes Historical Provider, MD  hydrALAZINE (APRESOLINE) 50 MG tablet TAKE 1 TABLET (50 MG TOTAL) BY MOUTH 3 (THREE) TIMES DAILY. 07/28/15  Yes Josue Hector, MD  insulin aspart (NOVOLOG) 100 UNIT/ML injection Inject 0-9 Units into the skin 3 (three) times daily with meals. CBG < 70: implement hypoglycemia protocol CBG 70 - 120: 0 units CBG 121 - 150: 1 unit CBG 151 - 200: 2 units CBG 201 - 250: 3  units CBG 251 - 300: 5 units CBG 301 - 350: 7 units CBG 351 - 400: 9 units CBG > 400: call MD. Patient taking differently: Inject 6 Units into the skin 3 (three) times daily with meals. CBG < 70: implement hypoglycemia protocol CBG 70 - 120: 0 units CBG 121 - 150: 1 unit CBG 151 - 200: 2 units CBG 201 - 250: 3 units CBG 251 - 300: 5 units CBG 301 - 350: 7 units CBG 351 - 400: 9 units CBG > 400: call MD. 06/12/14  Yes Modena Jansky, MD  insulin detemir (LEVEMIR) 100 UNIT/ML injection Inject 0.1 mLs (10 Units total) into the skin at bedtime. 06/12/14  Yes Modena Jansky, MD  isosorbide mononitrate (IMDUR) 30 MG 24 hr tablet TAKE 1 TABLET (30 MG TOTAL) BY MOUTH DAILY. 11/07/14  Yes Josue Hector, MD  simvastatin (ZOCOR) 20 MG tablet Take 1 tablet (20 mg total) by mouth daily at 6 PM. 03/17/14  Yes Scott T Kathlen Mody, PA-C  acetaminophen (TYLENOL) 325 MG tablet Take 2 tablets (650 mg total) by mouth every 6 (six) hours as needed for mild pain, moderate pain or headache (or Fever >/= 101). Patient not taking: Reported on 05/07/2015 06/12/14   Modena Jansky, MD  clindamycin (CLEOCIN) 300 MG capsule Take 1 capsule (300 mg total) by mouth 4 (four) times daily. X 7 days Patient not taking: Reported on 06/20/2015 05/07/15   Wandra Arthurs, MD  isosorbide mononitrate (IMDUR) 30 MG 24 hr tablet TAKE 1 TABLET (30 MG TOTAL) BY MOUTH DAILY. Patient not taking: Reported on 06/20/2015 06/10/15   Josue Hector, MD  meclizine (ANTIVERT) 12.5 MG tablet Take 1 tablet (12.5 mg total) by mouth 3 (three) times daily as needed for dizziness or nausea. Patient taking differently: Take 12.5 mg by mouth 3 (three) times daily as needed for dizziness or nausea. For dizzy spells 01/30/14   Verlee Monte, MD   Liver Function Tests  Recent Labs Lab 08/07/15 0505 08/08/15 0324 08/09/15 0216  AST 12* 12* 14*  ALT 11* 10* 10*  ALKPHOS 75 72 80  BILITOT 0.7 0.4 0.5  PROT 5.2* 5.1* 5.6*  ALBUMIN 2.1* 2.1* 2.1*   No  results for input(s): LIPASE, AMYLASE in the last 168 hours. CBC  Recent Labs Lab 08/07/15 0505 08/08/15 0324 08/09/15 0216  WBC 4.3 5.5 5.9  NEUTROABS 2.6 3.4 3.7  HGB 9.2* 9.0* 9.8*  HCT 31.8* 31.7* 34.2*  MCV 88.1 88.8 89.5  PLT  260 209 329   Basic Metabolic Panel  Recent Labs Lab 08/05/15 1130 08/06/15 0300 08/06/15 0940 08/07/15 0505 08/08/15 0324 08/09/15 0216  NA 146* 149* 148* 147* 143 139  K 5.1 5.3* 5.2* 4.8 5.0 5.4*  CL 111 112* 113* 110 107 102  CO2 '26 25 24 26 27 27  ' GLUCOSE 112* 109* 76 106* 178* 143*  BUN 57* 58* 60* 57* 64* 70*  CREATININE 3.78* 3.80* 3.82* 3.60* 3.73* 3.81*  CALCIUM 6.6* 6.9* 7.0* 7.1* 6.9* 7.2*    Filed Vitals:   08/09/15 0819 08/09/15 1220 08/09/15 1435 08/09/15 1640  BP: 137/54 126/43 109/47 102/71  Pulse: 77 66 72 72  Temp:  98.6 F (37 C)  98 F (36.7 C)  TempSrc:  Oral  Oral  Resp:  '17 20 20  ' Height:      Weight:      SpO2:  99% 99% 100%   Exam Elderly AAF , slightly frail No rash, cyanosis or gangrene Sclera anicteric, throat clear +jvd bilat basilar crackles RRR no mrg Abd obese soft ntnd no mass or ascites GU foley in place LE's/ UE's 1-2+ pitting edema bilat Chronic venous stasis changes bilat lower legs Pressure wound L post calf 4x 6 cm Moderate gen'd weakness, sig LE weakness Ox 3, nf  UA -  Protein >300 0-5 rbc, 0-5 wbc, 0-5 epi CXR 12/29 vasc congestion  Assessment: 1 CKD prob progression of disease, stage 4/5 . Not grossly uremic. Likely diab nephropathy 2 Vol overload/ edema on low dose po torsemide 3 Hyperkalemia 4 Debility pressure ulcer on L calf 5 Syst CHF EF 40-45% 6 DM x 30 y rs 7 HTN x 30 yrs   Plan - needs further diuresis, hasn't had much net loss. Changed to lasix IV 80 tid.  She has substantial comorbidities and is somewhat debilitated.  Doing dialysis may do more harm than good.  Have counseled pt and family about pro's and con's and asked pt to think about. She agrees to pall  care consult. Will follow.   [name] MD [employer] pager [phone]    cell [phone] 08/09/2015, 5:52 PM

## 2015-08-09 NOTE — Progress Notes (Signed)
[place] TEAM 1 - Stepdown/ICU TEAM Progress Note  Kathleen [name] [id number] DOB: [birth year] DOA: 08/05/2015 PCP: Benito Mccreedy, MD  Admit HPI / Brief Narrative: 70 y.o. BF PMHx HTN, DM Type 2, HLD, CKD stage III, CAD native artery, Chronic Combined CHF (01/2014 EF 25-30% by echo),   Presents the emergency department with chief complaint of gradual worsening shortness of breath, increased lower extremity edema and worsening orthopnea. Initial evaluation reveals acute on chronic combined heart failure.  Information is obtained from the patient and the daughter who is at the bedside. Patient reports a gradual worsening shortness of breath of the last 4 weeks. Should symptoms include sleep lower extremity edema and orthopnea. She reports that she has Lasix on her home medication list and she took some yesterday and the day before but she does not take daily. She denies chest pain palpitations headache dizziness syncope or near-syncope. She denies abdominal pain nausea vomiting. She denies dysuria hematuria ferequency or urgency.  In the emergency department she is afebrile hemodynamically stable oxygen saturation level 90% on room air. She is placed on 2 L nasal cannula.   HPI/Subjective: 1/1  A/O x 4. States does not weigh Daily and does not know [employer].  States has not seen cardiologist in significant amount of time, not keeping track of daily weight. States has not ambulated or set chair today. States uses walker for ambulation home. Slight increase SOB   Assessment/Plan: AMS -Most likely secondary to hypercapnia; resolved -Ambulate patient using walker -Obtain ambulatory SPO2 and record in appropriate Epic form  Acute REspiratory Failure w/ Hypercapnia/Respiratory Acidosis -Continue BiPAP PRN, currently on 2 L O2 via [place] doing well - goal SPO2> 90%  Pulmonary Vascular Congestion/Lt Pleural Effusion? -See CHF  Acute on chronic renal failure stage III(Baseline  ~2.5) -Improving with diuresis   -Patient's renal failure has worsened over the last year consulted nephrology; may be moving toward HD -Per Nephrology needs further diuresis -DC Torsemide 10 mg daily; per nephrology lasix IV 80 tidy, patient does not appear to be good candidate for HD   Acute Systolic and diastolic CHF - [id number] echocardiogram see results below  -Strict I&O since admission; -1.92 L -Daily a.m. Admission weight= 93 kg         1/1 bed weight= 93.4kg  Hypernatremia -D5W KVO  HLD -Continue Lipitor 20 mg daily  Noncompliance with medication -Patient not following with cardiologist as required. Counseled patient on sequela of continuing to be noncompliant to include death -PT/OT consult CIR vs SNF      Code Status: FULL Family Communication: no family present at time of exam Disposition Plan: Resolution altered mental status    Consultants: NA  Procedure/Significant Events: 12/29 echocardiogram;- Left ventricle: severeconcentric hypertrophy. -LVEF= 40% to 45%. - (grade 1diastolic dysfunction). - Tricuspid valve: moderate regurgitation. - Pulmonary arteries: PA peak pressure: 45 mm Hg (S).    Culture 12/28 MRSA by PCR negative  Antibiotics: NA  DVT prophylaxis: Subcutaneous heparin   Devices    LINES / TUBES:      Continuous Infusions: . dextrose Stopped (08/07/15 1800)    Objective: VITAL SIGNS: Temp: 97.4 F (36.3 C) (01/01 2000) Temp Source: Oral (01/01 2000) BP: 143/63 mmHg (01/01 2000) Pulse Rate: 65 (01/01 2000) BiPAP Set RR; 18 IPAP; 17 EPAP; 5 O2; 30%    Intake/Output Summary (Last 24 hours) at 08/09/15 2114 Last data filed at 08/09/15 2042  Gross per 24 hour  Intake    300 ml  Output  1325 ml  Net  -1025 ml     Exam: General:  A/O x 4, negative acute respiratory distress Eyes: Negative headache, eye pain, double vision,negative scleral hemorrhage ENT: Negative Runny nose, negative ear pain, negative  gingival bleeding, Neck:  Negative scars, masses, torticollis, lymphadenopathy, JVD Lungs:  clear to auscultation bilateral,  negative  wheezes or crackles Cardiovascular: Regular rate and rhythm without murmur gallop or rub normal S1 and S2 Abdomen:negative abdominal pain, nondistended, positive soft, bowel sounds, no rebound, no ascites, no appreciable mass Extremities: No significant cyanosis, clubbing, or edema bilateral lower extremities Psychiatric:  Negative depression, negative anxiety, negative fatigue, negative mania  Neurologic:  Cranial nerves II through XII intact, tongue/uvula midline, all extremities muscle strength 5/5, sensation intact throughout,  negative dysarthria, negative expressive aphasia, negative receptive aphasia.     Data Reviewed: Basic Metabolic Panel:  Recent Labs Lab 08/06/15 0300 08/06/15 0940 08/07/15 0505 08/08/15 0324 08/09/15 0216  NA 149* 148* 147* 143 139  K 5.3* 5.2* 4.8 5.0 5.4*  CL 112* 113* 110 107 102  CO2 25 24 26 27 27   GLUCOSE 109* 76 106* 178* 143*  BUN 58* 60* 57* 64* 70*  CREATININE 3.80* 3.82* 3.60* 3.73* 3.81*  CALCIUM 6.9* 7.0* 7.1* 6.9* 7.2*  MG  --  1.6* 1.6* 2.1 2.0   Liver Function Tests:  Recent Labs Lab 08/06/15 0940 08/07/15 0505 08/08/15 0324 08/09/15 0216  AST 17 12* 12* 14*  ALT 14 11* 10* 10*  ALKPHOS 82 75 72 80  BILITOT 0.4 0.7 0.4 0.5  PROT 5.1* 5.2* 5.1* 5.6*  ALBUMIN 2.3* 2.1* 2.1* 2.1*   No results for input(s): LIPASE, AMYLASE in the last 168 hours.  Recent Labs Lab 08/05/15 1603  AMMONIA 15   CBC:  Recent Labs Lab 08/05/15 1130 08/06/15 1140 08/07/15 0505 08/08/15 0324 08/09/15 0216  WBC 5.2 4.3 4.3 5.5 5.9  NEUTROABS 3.5 2.7 2.6 3.4 3.7  HGB 9.7* 9.7* 9.2* 9.0* 9.8*  HCT 32.8* 33.9* 31.8* 31.7* 34.2*  MCV 88.4 91.6 88.1 88.8 89.5  PLT 217 221 260 209 217   Cardiac Enzymes:  Recent Labs Lab 08/05/15 2200 08/06/15 0300  TROPONINI 0.10* 0.10*   BNP (last 3  results)  Recent Labs  08/05/15 1130  BNP 892.4*    ProBNP (last 3 results) No results for input(s): PROBNP in the last 8760 hours.  CBG:  Recent Labs Lab 08/08/15 1626 08/08/15 2121 08/09/15 0823 08/09/15 1224 08/09/15 1634  GLUCAP 211* 172* 116* 195* 211*    Recent Results (from the past 240 hour(s))  MRSA PCR Screening     Status: None   Collection Time: 08/05/15  6:53 PM  Result Value Ref Range Status   MRSA by PCR NEGATIVE NEGATIVE Final    Comment:        The GeneXpert MRSA Assay (FDA approved for NASAL specimens only), is one component of a comprehensive MRSA colonization surveillance program. It is not intended to diagnose MRSA infection nor to guide or monitor treatment for MRSA infections.      Studies:  Recent x-ray studies have been reviewed in detail by the Attending Physician  Scheduled Meds:  Scheduled Meds: . antiseptic oral rinse  7 mL Mouth Rinse BID  . aspirin  325 mg Oral Daily  . atorvastatin  20 mg Oral q1800  . brimonidine  1 drop Right Eye Q12H   And  . timolol  1 drop Right Eye Q12H  . carvedilol  12.5 mg  Oral BID WC  . furosemide  80 mg Intravenous 3 times per day  . heparin  5,000 Units Subcutaneous 3 times per day  . hydrALAZINE  50 mg Oral 3 times per day  . insulin aspart  0-20 Units Subcutaneous TID WC  . insulin aspart  0-5 Units Subcutaneous QHS  . insulin detemir  7 Units Subcutaneous QHS  . isosorbide mononitrate  30 mg Oral Daily  . sodium chloride  3 mL Intravenous Q12H    Time spent on care of this patient: 40 mins   Veryl Winemiller, Geraldo Docker , MD  [facility] Office  [phone] Pager [phone]  On-Call/Text Page:      [email or web address]      password [email or web address]  If 7PM-7AM, please contact night-coverage [email or web address] Password [email or web address] 08/09/2015, 9:14 PM   LOS: 4 days   Care during the described time interval was provided by me .  I have reviewed this patient's available data, including medical history, events  of note, physical examination, and all test results as part of my evaluation. I have personally reviewed and interpreted all radiology studies.   Dia Crawford, MD [phone] Pager

## 2015-08-09 NOTE — Progress Notes (Signed)
PROGRESS NOTE  Subjective:    70 y.o. female with a history of HTN, DM, HLD, CKD (06/12/14 Baseline Cr 2.2 ), carotid artery disease, tobacco abuse (quit 3 months ago), dementia and chronic mixed S/D CHF (EF recently improved), Admitted to [facility] with altered mental status and a fall.  Has been found to have acute worsening of her renal function.  She is much more awake today, off BiPAP since 12/30, states that she feels much better today, sitting in chair.  Objective:    Vital Signs:   Temp:  [98 F (36.7 C)-98.9 F (37.2 C)] 98.9 F (37.2 C) (01/01 0818) Pulse Rate:  [69-81] 77 (01/01 0819) Resp:  [12-31] 23 (01/01 0818) BP: (108-137)/(44-60) 137/54 mmHg (01/01 0819) SpO2:  [97 %-100 %] 98 % (01/01 0818) Weight:  [205 lb 14.6 oz (93.4 kg)] 205 lb 14.6 oz (93.4 kg) (01/01 0431)  Last BM Date: 08/07/15   24-hour weight change: Weight change: 1 lb 8.7 oz (0.7 kg)  Weight trends: Filed Weights   08/07/15 0500 08/08/15 0407 08/09/15 0431  Weight: 209 lb 14.1 oz (95.2 kg) 204 lb 5.9 oz (92.7 kg) 205 lb 14.6 oz (93.4 kg)   Intake/Output:  12/31 0701 - 01/01 0700 In: 930 [P.O.:930] Out: 875 [Urine:875] Total I/O In: -  Out: 175 [Urine:175]   Physical Exam: BP 137/54 mmHg  Pulse 77  Temp(Src) 98.9 F (37.2 C) (Axillary)  Resp 23  Ht 5\' 3"  (1.6 m)  Wt 205 lb 14.6 oz (93.4 kg)  BMI 36.48 kg/m2  SpO2 98%  Wt Readings from Last 3 Encounters:  08/09/15 205 lb 14.6 oz (93.4 kg)  05/07/15 200 lb (90.719 kg)  11/20/14 191 lb 12.8 oz (87 kg)    General: Vital signs reviewed and noted.   Head: Normocephalic, atraumatic.  Eyes: conjunctivae/corneas clear.  EOM's intact.   Throat: normal  Neck:  normal   Lungs:    few rales, some consolidation in right base   Heart:  RR  Abdomen:  Soft, non-tender, non-distended    Extremities: No edema    Neurologic: A&O X3, CN II - XII are grossly intact. ,    Psych: Normal     Labs: BMET:  Recent Labs  08/08/15 0324  08/09/15 0216  NA 143 139  K 5.0 5.4*  CL 107 102  CO2 27 27  GLUCOSE 178* 143*  BUN 64* 70*  CREATININE 3.73* 3.81*  CALCIUM 6.9* 7.2*  MG 2.1 2.0    Liver function tests:  Recent Labs  08/08/15 0324 08/09/15 0216  AST 12* 14*  ALT 10* 10*  ALKPHOS 72 80  BILITOT 0.4 0.5  PROT 5.1* 5.6*  ALBUMIN 2.1* 2.1*   CBC:  Recent Labs  08/08/15 0324 08/09/15 0216  WBC 5.5 5.9  NEUTROABS 3.4 3.7  HGB 9.0* 9.8*  HCT 31.7* 34.2*  MCV 88.8 89.5  PLT 209 217   Cardiac Enzymes: No results for input(s): CKTOTAL, CKMB, TROPONINI in the last 72 hours.  Recent Labs  08/06/15 1140  HGBA1C 7.3*   No results for input(s): CHOL, HDL, LDLCALC, TRIG, CHOLHDL in the last 72 hours.  Other results:  EKG  ( personally reviewed )  - NSR at 68.   Medications:    Infusions: . dextrose Stopped (08/07/15 1800)    Scheduled Medications: . antiseptic oral rinse  7 mL Mouth Rinse BID  . aspirin  325 mg Oral Daily  . atorvastatin  20 mg Oral q1800  .  brimonidine  1 drop Right Eye Q12H   And  . timolol  1 drop Right Eye Q12H  . carvedilol  12.5 mg Oral BID WC  . heparin  5,000 Units Subcutaneous 3 times per day  . hydrALAZINE  50 mg Oral 3 times per day  . insulin aspart  0-20 Units Subcutaneous TID WC  . insulin aspart  0-5 Units Subcutaneous QHS  . insulin detemir  7 Units Subcutaneous QHS  . isosorbide mononitrate  30 mg Oral Daily  . sodium chloride  3 mL Intravenous Q12H  . torsemide  10 mg Oral Daily    Assessment/ Plan:   Principal Problem:   CHF, acute on chronic (HCC) Active Problems:   Diabetes mellitus due to abnormal insulin (HCC)   Hyperlipidemia   CAD (coronary artery disease)   Obesity   Anemia   Essential hypertension   Acute renal failure superimposed on stage 3 chronic kidney disease (HCC)   Elevated troponin   CHF (congestive heart failure) (HCC)   Acute on chronic heart failure (HCC)   Somnolence   Acute respiratory failure with hypercapnia  (HCC)   Respiratory acidosis   Hypernatremia   Systolic and diastolic CHF, acute (HCC)   Disorientation   Pulmonary vascular congestion   Pleural effusion, left   Acute on chronic renal failure (HCC)   HLD (hyperlipidemia)   Noncompliance with medication regimen  1.  Chronic combined systolic and diastolic CHF :   Echo shows mild LV dysfunction with EF [id number], grade 1 diastolic dysfunction Mild MR, moderate TR, moderate pulmonary HTN with estimated PA pressure of 45.  She was taking lasix at home only as needed.   She probably needs to take it daily .   Now on Torsemide. Negative weight and I&O, increasing Crea, agree with decreased dose of torsemide with increasing BUN/crea levels. She is able to be transferred to telemetry.   2. Pulmonary HTN:   Continue imdur  3. Respiratory failure:   She has CO2 retention with acidosis which has improved with BIPAP and diuresis.    4. Acute on chronic kidney disease stage IV  Creatinine is slighly worse today with diuresis.  Further management per IM  Disposition:  Length of Stay: 4  Dorothy Spark, MD, [facility] 08/09/2015, 10:38 AM

## 2015-08-10 DIAGNOSIS — Z515 Encounter for palliative care: Secondary | ICD-10-CM | POA: Diagnosis present

## 2015-08-10 DIAGNOSIS — R0602 Shortness of breath: Secondary | ICD-10-CM | POA: Diagnosis present

## 2015-08-10 DIAGNOSIS — N189 Chronic kidney disease, unspecified: Secondary | ICD-10-CM

## 2015-08-10 DIAGNOSIS — E669 Obesity, unspecified: Secondary | ICD-10-CM

## 2015-08-10 LAB — COMPREHENSIVE METABOLIC PANEL
ALBUMIN: 2.1 g/dL — AB (ref 3.5–5.0)
ALT: 10 U/L — ABNORMAL LOW (ref 14–54)
AST: 12 U/L — AB (ref 15–41)
Alkaline Phosphatase: 75 U/L (ref 38–126)
Anion gap: 10 (ref 5–15)
BUN: 73 mg/dL — AB (ref 6–20)
CHLORIDE: 103 mmol/L (ref 101–111)
CO2: 26 mmol/L (ref 22–32)
Calcium: 7.3 mg/dL — ABNORMAL LOW (ref 8.9–10.3)
Creatinine, Ser: 3.73 mg/dL — ABNORMAL HIGH (ref 0.44–1.00)
GFR calc Af Amer: 13 mL/min — ABNORMAL LOW (ref >60)
GFR, EST NON AFRICAN AMERICAN: 11 mL/min — AB (ref >60)
Glucose, Bld: 173 mg/dL — ABNORMAL HIGH (ref 65–99)
POTASSIUM: 5.7 mmol/L — AB (ref 3.5–5.1)
Sodium: 139 mmol/L (ref 135–145)
Total Bilirubin: 0.5 mg/dL (ref 0.3–1.2)
Total Protein: 5.3 g/dL — ABNORMAL LOW (ref 6.5–8.1)

## 2015-08-10 LAB — CBC WITH DIFFERENTIAL/PLATELET
BASOS ABS: 0 10*3/uL (ref 0.0–0.1)
Basophils Relative: 0 %
Eosinophils Absolute: 0.3 10*3/uL (ref 0.0–0.7)
Eosinophils Relative: 5 %
HEMATOCRIT: 31.8 % — AB (ref 36.0–46.0)
HEMOGLOBIN: 9.1 g/dL — AB (ref 12.0–15.0)
LYMPHS PCT: 12 %
Lymphs Abs: 0.8 10*3/uL (ref 0.7–4.0)
MCH: 25.6 pg — ABNORMAL LOW (ref 26.0–34.0)
MCHC: 28.6 g/dL — ABNORMAL LOW (ref 30.0–36.0)
MCV: 89.3 fL (ref 78.0–100.0)
MONO ABS: 0.6 10*3/uL (ref 0.1–1.0)
Monocytes Relative: 9 %
NEUTROS ABS: 4.5 10*3/uL (ref 1.7–7.7)
Neutrophils Relative %: 74 %
Platelets: 227 10*3/uL (ref 150–400)
RBC: 3.56 MIL/uL — AB (ref 3.87–5.11)
RDW: 16.2 % — AB (ref 11.5–15.5)
WBC: 6.2 10*3/uL (ref 4.0–10.5)

## 2015-08-10 LAB — GLUCOSE, CAPILLARY
GLUCOSE-CAPILLARY: 119 mg/dL — AB (ref 65–99)
GLUCOSE-CAPILLARY: 111 mg/dL — AB (ref 65–99)
GLUCOSE-CAPILLARY: 155 mg/dL — AB (ref 65–99)
Glucose-Capillary: 154 mg/dL — ABNORMAL HIGH (ref 65–99)
Glucose-Capillary: 192 mg/dL — ABNORMAL HIGH (ref 65–99)

## 2015-08-10 LAB — MAGNESIUM: MAGNESIUM: 2 mg/dL (ref 1.7–2.4)

## 2015-08-10 MED ORDER — FENTANYL CITRATE (PF) 100 MCG/2ML IJ SOLN
12.5000 ug | INTRAMUSCULAR | Status: DC | PRN
  Administered 2015-08-11: 12.5 ug via INTRAVENOUS
  Filled 2015-08-10: qty 2

## 2015-08-10 MED ORDER — SODIUM POLYSTYRENE SULFONATE 15 GM/60ML PO SUSP
30.0000 g | Freq: Once | ORAL | Status: AC
  Administered 2015-08-10: 30 g via ORAL
  Filled 2015-08-10: qty 120

## 2015-08-10 MED ORDER — DEXTROSE 5 % IV SOLN
120.0000 mg | Freq: Three times a day (TID) | INTRAVENOUS | Status: DC
  Administered 2015-08-10 – 2015-08-12 (×6): 120 mg via INTRAVENOUS
  Filled 2015-08-10 (×8): qty 12

## 2015-08-10 NOTE — Care Management Important Message (Signed)
Important Message  Patient Details  Name: Kathleen [name] MRN: [id number] Date of Birth: [birth year]   Medicare Important Message Given:  Yes    [name] 08/10/2015, 11:11 AMImportant Message  Patient Details  Name: Kathleen [name] MRN: [id number] Date of Birth: 10-02-1945   Medicare Important Message Given:  Yes    [name] 08/10/2015, 11:10 AM

## 2015-08-10 NOTE — Consult Note (Signed)
Consultation Note Date: 08/10/2015   Patient Name: Kathleen [name]  DOB: 06-Apr-1946  MRN: [id number]  Age / Sex: 70 y.o., female  PCP: Benito Mccreedy, MD Referring Physician: Cherene Altes, MD  Reason for Consultation: Establishing goals of care, Symptom management    Clinical Assessment/Narrative: Patient seen with Dr. Rowe Pavy.  Kathleen [name] is a 70 y.o. female with a Past Medical History of DM, CKD, HTN, CAD, systolic CHF, HLD, anemia who presents with shortness of breath, acute on chronic renal failure, and acute on chronic systolic CHF with altered mental status. She was admitted on 12/28.  Her mental status and respiratory failure have improved but her kidney function is mostly unchanged and her urine output is poor.  Nephrology is consulting and discussed hemodialysis with Kathleen [name] this am.  She declined hemodialysis.  We met with Kathleen [name], her son [name], and her ex husband, [name].  The patient currently lives with her daughter who is not present in the room.  Per [name], Ms. Kohlbeck has been less active over the last several months.  She gets up in the morning, dresses herself and then is mostly sedentary for the rest of the day.  She has received care at the wound center for blisters on her legs.  [name] relates that his mother likely refused HD as her mother had it.  A family meeting was offered to discuss code status and care options without hemodialysis.  We will round again on 1/3 and attempt a family meeting.   Contacts/Participants in Discussion: Patient was primarily sleeping, Son, [name] and Ex-husband [name] are present. Primary Decision Maker: Patient.   Relationship to Patient:  self HCPOA: no   SUMMARY OF RECOMMENDATIONS  1.  Continue current scope of treatment. 2.  Added low dose fentanyl for back pain. 3.  Family meeting for [place] and code status discussion potentially on 1/3.   Code  Status/Advance Care Planning: Full code    Code Status Orders        Start     Ordered   08/05/15 2053  Full code   Continuous     08/05/15 2052      Other Directives:None  Symptom Management:   Fentanyl 12.5 mcg/ q3 prn pain.   Palliative Prophylaxis:   Bowel Regimen, Frequent Pain Assessment and Turn Reposition  Additional Recommendations (Limitations, Scope, Preferences):  Full Scope Treatment  Patient has been evaluated by [place] [facility] and recommendations made.  Physical therapy evaluation  Psycho-social/Spiritual:  Support System: Strong Desire for further Chaplaincy support: No Additional Recommendations: Caregiving  Support/Resources  Prognosis: < 12 months  Discharge Planning: Home with Home Health vs SNF    Chief Complaint/ Primary Diagnoses: Present on Admission:  . Diabetes mellitus due to abnormal insulin ([place]) . Anemia . Essential hypertension . Acute renal failure superimposed on stage 3 chronic kidney disease ([place]) . Elevated troponin . Hyperlipidemia . CAD (coronary artery disease) . Obesity . Somnolence . Acute respiratory failure with hypercapnia ([place]) . Respiratory acidosis . Hypernatremia . Systolic and diastolic CHF, acute ([place]) . Disorientation . Pulmonary vascular congestion . Pleural effusion, left . Acute on chronic renal failure ([place]) . HLD (hyperlipidemia) . Noncompliance with medication regimen  I have reviewed the medical record, interviewed the patient and family, and examined the patient. The following aspects are pertinent.  Past Medical History  Diagnosis Date  . Diabetes mellitus without complication ([place])   . CKD (chronic kidney disease), stage III     a. Borderline III-IV.  [name]  Hypertension   . CAD (coronary artery disease)     a. Cath 01/2014: mild in LAD, mild-mod LCx, mod-severe RCA - for med rx.  . Systolic CHF ([place])     a. 01/2014: dx with mixed ICM/NICM (out of proportion to CAD) EF 25-30% by echo.   .  Hyperlipidemia   . Anemia     a. 01/2014: suspected of chronic disease, negative FOBT.  [name] Meningioma ([place])     a. Incidental dx 01/2014.  . Obesity   . History of echocardiogram     Echo (10/15):  Mod LVH, EF 50-55%, Gr 1 DD, MAC, mild MR, mild TR, PASP 35 mmHg   Social History   Social History  . Marital Status: Divorced    Spouse Name: N/A  . Number of Children: N/A  . Years of Education: N/A   Social History Main Topics  . Smoking status: Former Smoker -- 0.25 packs/day for 30 years    Types: Cigarettes  . Smokeless tobacco: Never Used  . Alcohol Use: No  . Drug Use: No  . Sexual Activity: No   Other Topics Concern  . None   Social History Narrative   Family History  Problem Relation Age of Onset  . Diabetic kidney disease Mother   . Hypertension Mother   . Hypertension Father   . Heart failure Mother   . Heart attack Mother   . Hyperlipidemia Mother    Scheduled Meds: . antiseptic oral rinse  7 mL Mouth Rinse BID  . aspirin  325 mg Oral Daily  . atorvastatin  20 mg Oral q1800  . brimonidine  1 drop Right Eye Q12H   And  . timolol  1 drop Right Eye Q12H  . carvedilol  12.5 mg Oral BID WC  . furosemide  120 mg Intravenous 3 times per day  . heparin  5,000 Units Subcutaneous 3 times per day  . hydrALAZINE  50 mg Oral 3 times per day  . insulin aspart  0-20 Units Subcutaneous TID WC  . insulin aspart  0-5 Units Subcutaneous QHS  . insulin detemir  7 Units Subcutaneous QHS  . isosorbide mononitrate  30 mg Oral Daily  . sodium chloride  3 mL Intravenous Q12H  . sodium polystyrene  30 g Oral Once   Continuous Infusions: . dextrose Stopped (08/07/15 1800)   PRN Meds:.sodium chloride, acetaminophen, fentaNYL (SUBLIMAZE) injection, meclizine, ondansetron (ZOFRAN) IV, sodium chloride Medications Prior to Admission:  Prior to Admission medications   Medication Sig Start Date End Date Taking? Authorizing Provider  aspirin 81 MG EC tablet Take 1 tablet (81 mg  total) by mouth daily. 01/30/14  Yes Verlee Monte, MD  brimonidine-timolol (COMBIGAN) 0.2-0.5 % ophthalmic solution Place 1 drop into the right eye 2 (two) times daily as needed.    Yes Historical Provider, MD  carvedilol (COREG) 12.5 MG tablet Take 12.5 mg by mouth 2 (two) times daily with a meal.  09/30/14  Yes Historical Provider, MD  furosemide (LASIX) 40 MG tablet Take 40 mg by mouth daily as needed for fluid or edema.    Yes Historical Provider, MD  hydrALAZINE (APRESOLINE) 50 MG tablet TAKE 1 TABLET (50 MG TOTAL) BY MOUTH 3 (THREE) TIMES DAILY. 07/28/15  Yes Josue Hector, MD  insulin aspart (NOVOLOG) 100 UNIT/ML injection Inject 0-9 Units into the skin 3 (three) times daily with meals. CBG < 70: implement hypoglycemia protocol CBG 70 - 120: 0 units CBG 121 - 150: 1 unit CBG  151 - 200: 2 units CBG 201 - 250: 3 units CBG 251 - 300: 5 units CBG 301 - 350: 7 units CBG 351 - 400: 9 units CBG > 400: call MD. Patient taking differently: Inject 6 Units into the skin 3 (three) times daily with meals. CBG < 70: implement hypoglycemia protocol CBG 70 - 120: 0 units CBG 121 - 150: 1 unit CBG 151 - 200: 2 units CBG 201 - 250: 3 units CBG 251 - 300: 5 units CBG 301 - 350: 7 units CBG 351 - 400: 9 units CBG > 400: call MD. 06/12/14  Yes Modena Jansky, MD  insulin detemir (LEVEMIR) 100 UNIT/ML injection Inject 0.1 mLs (10 Units total) into the skin at bedtime. 06/12/14  Yes Modena Jansky, MD  isosorbide mononitrate (IMDUR) 30 MG 24 hr tablet TAKE 1 TABLET (30 MG TOTAL) BY MOUTH DAILY. 11/07/14  Yes Josue Hector, MD  simvastatin (ZOCOR) 20 MG tablet Take 1 tablet (20 mg total) by mouth daily at 6 PM. 03/17/14  Yes Scott T Kathlen Mody, PA-C  acetaminophen (TYLENOL) 325 MG tablet Take 2 tablets (650 mg total) by mouth every 6 (six) hours as needed for mild pain, moderate pain or headache (or Fever >/= 101). Patient not taking: Reported on 05/07/2015 06/12/14   Modena Jansky, MD  clindamycin (CLEOCIN)  300 MG capsule Take 1 capsule (300 mg total) by mouth 4 (four) times daily. X 7 days Patient not taking: Reported on 06/20/2015 05/07/15   Wandra Arthurs, MD  isosorbide mononitrate (IMDUR) 30 MG 24 hr tablet TAKE 1 TABLET (30 MG TOTAL) BY MOUTH DAILY. Patient not taking: Reported on 06/20/2015 06/10/15   Josue Hector, MD  meclizine (ANTIVERT) 12.5 MG tablet Take 1 tablet (12.5 mg total) by mouth 3 (three) times daily as needed for dizziness or nausea. Patient taking differently: Take 12.5 mg by mouth 3 (three) times daily as needed for dizziness or nausea. For dizzy spells 01/30/14   Verlee Monte, MD   No Known Allergies  Review of Systems  Unable to perform ROS Constitutional: Positive for activity change and appetite change.  Musculoskeletal: Positive for back pain.  Skin: Positive for wound.  Neurological: Positive for weakness.  Psychiatric/Behavioral: Positive for confusion.    Physical Exam  Constitutional: She appears well-developed. She appears lethargic.  HENT:  Head: Normocephalic.  Cardiovascular: Normal rate, regular rhythm, S1 normal and S2 normal.   Murmur heard.  Systolic murmur is present with a grade of 2/6  Respiratory: Effort normal and breath sounds normal.  GI: Soft. Bowel sounds are decreased.  Neurological: She appears lethargic.  Psychiatric: She is slowed and withdrawn.    Vital Signs: BP 132/63 mmHg  Pulse 73  Temp(Src) 98.6 F (37 C) (Oral)  Resp 19  Ht '5\' 3"'  (1.6 m)  Wt 95.8 kg (211 lb 3.2 oz)  BMI 37.42 kg/m2  SpO2 98%  SpO2: SpO2: 98 % O2 Device:SpO2: 98 % O2 Flow Rate: .O2 Flow Rate (L/min): 2 L/min  IO: Intake/output summary:   Intake/Output Summary (Last 24 hours) at 08/10/15 1123 Last data filed at 08/10/15 0700  Gross per 24 hour  Intake    120 ml  Output   1150 ml  Net  -1030 ml    LBM: Last BM Date: 08/07/15 Baseline Weight: Weight: 93 kg (205 lb 0.4 oz) Most recent weight: Weight: 95.8 kg (211 lb 3.2 oz)      Palliative  Assessment/Data:    Additional  Data Reviewed:  CBC:    Component Value Date/Time   WBC 6.2 08/10/2015 0234   HGB 9.1* 08/10/2015 0234   HCT 31.8* 08/10/2015 0234   PLT 227 08/10/2015 0234   MCV 89.3 08/10/2015 0234   NEUTROABS 4.5 08/10/2015 0234   LYMPHSABS 0.8 08/10/2015 0234   MONOABS 0.6 08/10/2015 0234   EOSABS 0.3 08/10/2015 0234   BASOSABS 0.0 08/10/2015 0234   Comprehensive Metabolic Panel:    Component Value Date/Time   NA 139 08/10/2015 0234   K 5.7* 08/10/2015 0234   CL 103 08/10/2015 0234   CO2 26 08/10/2015 0234   BUN 73* 08/10/2015 0234   CREATININE 3.73* 08/10/2015 0234   GLUCOSE 173* 08/10/2015 0234   CALCIUM 7.3* 08/10/2015 0234   AST 12* 08/10/2015 0234   ALT 10* 08/10/2015 0234   ALKPHOS 75 08/10/2015 0234   BILITOT 0.5 08/10/2015 0234   PROT 5.3* 08/10/2015 0234   ALBUMIN 2.1* 08/10/2015 0234     Time In: 10:20 Time Out: 11:20 Time Total: 60 minutes Greater than 50%  of this time was spent counseling and coordinating care related to the above assessment and plan.  Signed by:  Melton Alar, PA-C  08/10/2015, 11:23 AM  Please contact Palliative Medicine Team phone at [phone] for questions and concerns.

## 2015-08-10 NOTE — Progress Notes (Signed)
[facility] Rounding Note  Subjective:  Still SOB No pain Says does not want to and would not undergo dialysis if the indications arose, understanding that comfort care would be implemented at that point  Objective  Vital signs in last 24 hours: Filed Vitals:   08/10/15 0000 08/10/15 0400 08/10/15 0407 08/10/15 0538  BP:  121/60  145/62  Pulse: 66 77    Temp:  98.8 F (37.1 C)    TempSrc:  Oral    Resp: 18 26    Height:      Weight:   95.8 kg (211 lb 3.2 oz)   SpO2: 99% 99%     Weight change: 2.4 kg (5 lb 4.7 oz)  Intake/Output Summary (Last 24 hours) at 08/10/15 0805 Last data filed at 08/10/15 0700  Gross per 24 hour  Intake    240 ml  Output   1325 ml  Net  -1085 ml    Physical Exam: BP 145/62 mmHg  Pulse 77  Temp(Src) 98.8 F (37.1 C) (Oral)  Resp 26  Ht 5\' 3"  (1.6 m)  Wt 95.8 kg (211 lb 3.2 oz)  BMI 37.42 kg/m2  SpO2 99%  Elderly AAF lying in bed, some increased WOB +JVD Crackles bilaterally Regular S1S2 No S3S4 1/6 murmur LSB Abd obese soft ntnd  GU foley in place LE's/ UE's 1-2+ pitting edema bilat SCD's in place Ox 3  Labs:   Recent Labs Lab 08/05/15 1130 08/06/15 0300 08/06/15 0940 08/07/15 0505 08/08/15 0324 08/09/15 0216 08/10/15 0234  NA 146* 149* 148* 147* 143 139 139  K 5.1 5.3* 5.2* 4.8 5.0 5.4* 5.7*  CL 111 112* 113* 110 107 102 103  CO2 26 25 24 26 27 27 26   GLUCOSE 112* 109* 76 106* 178* 143* 173*  BUN 57* 58* 60* 57* 64* 70* 73*  CREATININE 3.78* 3.80* 3.82* 3.60* 3.73* 3.81* 3.73*  CALCIUM 6.6* 6.9* 7.0* 7.1* 6.9* 7.2* 7.3*     Recent Labs Lab 08/08/15 0324 08/09/15 0216 08/10/15 0234  AST 12* 14* 12*  ALT 10* 10* 10*  ALKPHOS 72 80 75  BILITOT 0.4 0.5 0.5  PROT 5.1* 5.6* 5.3*  ALBUMIN 2.1* 2.1* 2.1*     Recent Labs Lab 08/05/15 1603  AMMONIA 15     Recent Labs Lab 08/07/15 0505 08/08/15 0324 08/09/15 0216 08/10/15 0234  WBC 4.3 5.5 5.9 6.2  NEUTROABS 2.6 3.4 3.7 4.5  HGB 9.2*  9.0* 9.8* 9.1*  HCT 31.8* 31.7* 34.2* 31.8*  MCV 88.1 88.8 89.5 89.3  PLT 260 209 217 227     Recent Labs Lab 08/05/15 2200 08/06/15 0300  TROPONINI 0.10* 0.10*     Recent Labs Lab 08/08/15 2121 08/09/15 0823 08/09/15 1224 08/09/15 1634 08/09/15 2133  GLUCAP 172* 116* 195* 211* 154*   UA - Protein >300 0-5 rbc, 0-5 wbc, 0-5 epi CXR 12/29 vasc congestion  Medications: . dextrose Stopped (08/07/15 1800)   . antiseptic oral rinse  7 mL Mouth Rinse BID  . aspirin  325 mg Oral Daily  . atorvastatin  20 mg Oral q1800  . brimonidine  1 drop Right Eye Q12H   And  . timolol  1 drop Right Eye Q12H  . carvedilol  12.5 mg Oral BID WC  . furosemide  80 mg Intravenous 3 times per day  . heparin  5,000 Units Subcutaneous 3 times per day  . hydrALAZINE  50 mg Oral 3 times per day  . insulin aspart  0-20 Units Subcutaneous TID WC  . insulin aspart  0-5 Units Subcutaneous QHS  . insulin detemir  7 Units Subcutaneous QHS  . isosorbide mononitrate  30 mg Oral Daily  . sodium chloride  3 mL Intravenous Q12H    Background: 70 y.o. year-old with hx of HTN, DM2, CAD, CHF and CKD who presented on 12/28 with confusion, SOB, +orthopnea, sig LE edema on admission. Admitted w dx of a/c diast HF, hypercapnic resp failure and started on IV lasix. ECHO 12/29 LVEF 40-45%.  Known (progressive) CKD with most recent baseline creatinine prior to admission of 2.52 04/2015 (GFR 21 Stage 4) and on admission 12/28 was 3.8 (GFR 13). Has never seen Nephrology as outpt. We were asked to see.  .  Assessment/Recommendations  1. AKI on CKD4 - renal function has not improved since admission (though has not worsened either).  Has not had a very brisk diuresis with lasix so far - dose increased 1/1 to 80 TID - MAY be seeing some increase in UOP but not much. Bump to 120 TID. She is not the best candidate for long term dialysis and we will have ongoing conversations about this but as of this AM she has indicated  that she WOULD NOT undergo dialysis treatments. Understands that if no dialysis when the time comes, then comfort measures only option. Palliative Care to see. (pt's mother was on dialysis so she has some knowledge base for her decision) 2. Volume overload/edema - continue IV lasix. Bump to 120. 3. Hyperkalemia - worsening. Kayexalate 30 gm today. Change to carb modified renal diet.  4. Anemia - check fe studies. Replete if low. Consider Aranesp 5. Acute systolic and diastolic CHF 6. DM 7. HTN 8. Hypercapnic resp failure - improved some  9. LLE calf wound. WOC seeing. Partial thickness skin loss described.   Jamal Maes, MD [facility] [phone] Pager 08/10/2015, 8:44 AM

## 2015-08-10 NOTE — Progress Notes (Addendum)
[place] TEAM 1 - Stepdown/ICU TEAM PROGRESS NOTE  Kathleen [name] [id number] DOB: Dec 29, 1945 DOA: 08/05/2015 PCP: Benito Mccreedy, MD  Admit HPI / Brief Narrative: 70 yo F Hx HTN, DM2, HLD, CKD, CAD, and Chronic Combined CHF (01/2014 EF 25-30% by echo) who presented to the ED c/o gradual worsening shortness of breath, increased lower extremity edema, and worsening orthopnea.   In the emergency department she is afebrile hemodynamically stable oxygen saturation level 90% on room air. She is placed on 2 L nasal cannula.  HPI/Subjective: Pt is resting comfortably at the time of my visit.  She states he is mildly short of breath.  She denies chest pain fevers chills nausea vomiting or abdominal pain.  Assessment/Plan:  Toxic metabolic encephalopathy - resolved  -pt is alert and conversant today - felt to be due to hypercapnia   Acute Respiratory Failure w/ Hypercapnia Respiratory Acidosis -now off BIPAP - resp status appears stable   Acute on CKD Stage 4 (Baseline ~2.5) -patient's renal failure has worsened over the last year  -pt does not desire HD, even if it means changing to comfort care only - Palliative Care is seeing the pt  -Nephrology continues to follow and is directing diuretic regimen  Hyperkalemia -dose w/ kayexalate prn   Acute exacerbation of chronic combined Systolic and Diastolic CHF -EF [id number] w/ grade I DD per TTE this admit  -admit weight 93 kg- presently 95.8kg  -diuresis per Nephrology  Pulmonary HTN  Normocytic Anemia  -due to CKD - checking Fe indices   DM CBG reasonably well controlled at this time   Hypernatremia -resolved   HLD -Continue Lipitor 20 mg daily  LLE calf wound -as per WOC - appears to be ruptured old blister  Code Status: FULL Family Communication: no family present at time of exam Disposition Plan: SDU   Consultants: Nephrology [facility]  Palliative Care  Procedures: 12/29 TTE - LV severeconcentric  hypertrophy - EF [id number] - grade 1diastolic dysfunction - Tricuspid valve: moderate regurgitation - PA peak pressure: 45 mm Hg  Antibiotics: none  DVT prophylaxis: SQ heparin   Objective: Blood pressure 113/51, pulse 64, temperature 98.4 F (36.9 C), temperature source Oral, resp. rate 19, height 5\' 3"  (1.6 m), weight 95.8 kg (211 lb 3.2 oz), SpO2 99 %.  Intake/Output Summary (Last 24 hours) at 08/10/15 1411 Last data filed at 08/10/15 1356  Gross per 24 hour  Intake    262 ml  Output   1375 ml  Net  -1113 ml    Exam: General: No acute respiratory distress resting in bed on oxygen  Lungs: fine crackles scattered th/o - no wheeze  Cardiovascular: Regular rate and rhythm without gallop or rub Abdomen: Nontender, nondistended, soft, bowel sounds positive, no rebound, no ascites, no appreciable mass Extremities: No significant cyanosis, or clubbing; 1+ edema bilateral lower extremities  Data Reviewed:  Basic Metabolic Panel:  Recent Labs Lab 08/06/15 0940 08/07/15 0505 08/08/15 0324 08/09/15 0216 08/10/15 0234  NA 148* 147* 143 139 139  K 5.2* 4.8 5.0 5.4* 5.7*  CL 113* 110 107 102 103  CO2 24 26 27 27 26   GLUCOSE 76 106* 178* 143* 173*  BUN 60* 57* 64* 70* 73*  CREATININE 3.82* 3.60* 3.73* 3.81* 3.73*  CALCIUM 7.0* 7.1* 6.9* 7.2* 7.3*  MG 1.6* 1.6* 2.1 2.0 2.0    CBC:  Recent Labs Lab 08/06/15 1140 08/07/15 0505 08/08/15 0324 08/09/15 0216 08/10/15 0234  WBC 4.3 4.3 5.5 5.9 6.2  NEUTROABS 2.7 2.6 3.4 3.7 4.5  HGB 9.7* 9.2* 9.0* 9.8* 9.1*  HCT 33.9* 31.8* 31.7* 34.2* 31.8*  MCV 91.6 88.1 88.8 89.5 89.3  PLT 221 260 209 217 227    Liver Function Tests:  Recent Labs Lab 08/06/15 0940 08/07/15 0505 08/08/15 0324 08/09/15 0216 08/10/15 0234  AST 17 12* 12* 14* 12*  ALT 14 11* 10* 10* 10*  ALKPHOS 82 75 72 80 75  BILITOT 0.4 0.7 0.4 0.5 0.5  PROT 5.1* 5.2* 5.1* 5.6* 5.3*  ALBUMIN 2.3* 2.1* 2.1* 2.1* 2.1*    Recent Labs Lab 08/05/15 1603    AMMONIA 15    Cardiac Enzymes:  Recent Labs Lab 08/05/15 2200 08/06/15 0300  TROPONINI 0.10* 0.10*    CBG:  Recent Labs Lab 08/09/15 1224 08/09/15 1634 08/09/15 2133 08/10/15 0807 08/10/15 1124  GLUCAP 195* 211* 154* 119* 111*    Recent Results (from the past 240 hour(s))  MRSA PCR Screening     Status: None   Collection Time: 08/05/15  6:53 PM  Result Value Ref Range Status   MRSA by PCR NEGATIVE NEGATIVE Final    Comment:        The GeneXpert MRSA Assay (FDA approved for NASAL specimens only), is one component of a comprehensive MRSA colonization surveillance program. It is not intended to diagnose MRSA infection nor to guide or monitor treatment for MRSA infections.      Studies:   Recent x-ray studies have been reviewed in detail by the Attending Physician  Scheduled Meds:  Scheduled Meds: . antiseptic oral rinse  7 mL Mouth Rinse BID  . aspirin  325 mg Oral Daily  . atorvastatin  20 mg Oral q1800  . brimonidine  1 drop Right Eye Q12H   And  . timolol  1 drop Right Eye Q12H  . carvedilol  12.5 mg Oral BID WC  . furosemide  120 mg Intravenous 3 times per day  . heparin  5,000 Units Subcutaneous 3 times per day  . hydrALAZINE  50 mg Oral 3 times per day  . insulin aspart  0-20 Units Subcutaneous TID WC  . insulin aspart  0-5 Units Subcutaneous QHS  . insulin detemir  7 Units Subcutaneous QHS  . isosorbide mononitrate  30 mg Oral Daily  . sodium chloride  3 mL Intravenous Q12H    Time spent on care of this patient: 35 mins   Anaih Brander T , MD   [facility] Office  [phone] Pager - Text Page per Shea Evans as per below:  On-Call/Text Page:      [email or web address]      password [email or web address]  If 7PM-7AM, please contact night-coverage [email or web address] Password [email or web address] 08/10/2015, 2:11 PM   LOS: 5 days

## 2015-08-10 NOTE — Evaluation (Signed)
Occupational Therapy Evaluation Patient Details Name: Kathleen [name] MRN: [id number] DOB: August 29, 1945 Today's Date: 08/10/2015    History of Present Illness pt presents with Acute on Chronic CHF, a Fall, and AMS.  pt with hx of HTN, DM, CKD, CAD, CHF, and Dementia.     Clinical Impression   This 38 female admitted with above presents to acute with decreased mobility, decreased balance, decreased arousal, decreased following commands, decreased eye opening all affecting her ability to care for herself and increasing burden of care on caregivers. She will benefit from trial of acute OT to work on increasing mobility with self care tasks to ease burden of care.    Follow Up Recommendations  SNF    Equipment Recommendations  None recommended by OT       Precautions / Restrictions Precautions Precautions: Fall Restrictions Weight Bearing Restrictions: No      Mobility Bed Mobility Overal bed mobility: Needs Assistance Bed Mobility: Rolling Rolling: Total assist (with use of pad)                    ADL Overall ADL's : Needs assistance/impaired                                       General ADL Comments: Pt currently total A for all basic ADLs at bed level due to increased lethargy and decreased command following     Vision Additional Comments: Unable to test due to decreased eye opening and following commands          Pertinent Vitals/Pain Pain Assessment: Faces Faces Pain Scale: Hurts even more Pain Location: when helping her to move her LLE (knee and hip) and right hip {per son pt has bad OA) Pain Descriptors / Indicators: Aching;Sore;Grimacing Pain Intervention(s): Monitored during session;Repositioned     Hand Dominance Right   Extremity/Trunk Assessment Upper Extremity Assessment Upper Extremity Assessment: Generalized weakness (Pt will A me with raising her arms up one at a time, but cannot maintain once raised)            Communication Communication Communication: No difficulties   Cognition Arousal/Alertness: Lethargic Behavior During Therapy: Flat affect Overall Cognitive Status: Impaired/Different from baseline Area of Impairment: Orientation;Following commands;Problem solving Orientation Level: Disoriented to (DOB)     Following Commands: Follows one step commands inconsistently;Follows one step commands with increased time     Problem Solving: Slow processing;Decreased initiation;Difficulty sequencing;Requires verbal cues;Requires tactile cues                Home Living Family/patient expects to be discharged to:: Skilled nursing facility                                        Prior Functioning/Environment          Comments: Unclear PLOF.      OT Diagnosis: Generalized weakness;Cognitive deficits;Acute pain   OT Problem List: Decreased strength;Decreased range of motion;Decreased activity tolerance;Impaired balance (sitting and/or standing);Pain;Decreased cognition;Impaired UE functional use;Obesity;Increased edema   OT Treatment/Interventions: Self-care/ADL training;Patient/family education;Therapeutic exercise;Therapeutic activities;Balance training;DME and/or AE instruction;Cognitive remediation/compensation    OT Goals(Current goals can be found in the care plan section) Acute Rehab OT Goals Patient Stated Goal: pt unable to state.   OT Goal Formulation: Patient unable to participate in goal setting Time For  Goal Achievement: 08/24/15 Potential to Achieve Goals: Fair  OT Frequency: Min 2X/week   Barriers to D/C: Decreased caregiver support             End of Session Nurse Communication:  (pt following few commands and when does with increased time. No safe to get up or back with +1 A)  Activity Tolerance: Patient limited by fatigue;Patient limited by lethargy;Patient limited by pain Patient left: in bed;with call bell/phone within reach;with bed alarm  set   Time: 1255-1315 OT Time Calculation (min): 20 min Charges:  OT General Charges $OT Visit: 1 Procedure OT Evaluation (Mod) $Initial OT Evaluation Tier I: 1 Procedure  Almon Register [id number] 08/10/2015, 1:31 PM

## 2015-08-10 NOTE — Progress Notes (Signed)
PROGRESS NOTE  Subjective:    70 y.o. female with a history of HTN, DM, HLD, CKD (06/12/14 Baseline Cr 2.2 ), carotid artery disease, tobacco abuse (quit 3 months ago), dementia and chronic mixed S/D CHF (EF recently improved), Admitted to [facility] with altered mental status and a fall.  Has been found to have acute worsening of her renal function.  She has improved in the first two days, but now deteriorated, worsening kidney failure and worsening dyspnea.    Objective:    Vital Signs:   Temp:  [97.4 F (36.3 C)-98.8 F (37.1 C)] 98.6 F (37 C) (01/02 0805) Pulse Rate:  [65-77] 73 (01/02 0805) Resp:  [17-32] 19 (01/02 0805) BP: (96-145)/(43-71) 132/63 mmHg (01/02 0805) SpO2:  [98 %-100 %] 98 % (01/02 0805) Weight:  [211 lb 3.2 oz (95.8 kg)] 211 lb 3.2 oz (95.8 kg) (01/02 0407)  Last BM Date: 08/07/15   24-hour weight change: Weight change: 5 lb 4.7 oz (2.4 kg)  Weight trends: Filed Weights   08/08/15 0407 08/09/15 0431 08/10/15 0407  Weight: 204 lb 5.9 oz (92.7 kg) 205 lb 14.6 oz (93.4 kg) 211 lb 3.2 oz (95.8 kg)   Intake/Output:  01/01 0701 - 01/02 0700 In: 240 [P.O.:240] Out: 1325 [Urine:1325]    Physical Exam: BP 132/63 mmHg  Pulse 73  Temp(Src) 98.6 F (37 C) (Oral)  Resp 19  Ht 5\' 3"  (1.6 m)  Wt 211 lb 3.2 oz (95.8 kg)  BMI 37.42 kg/m2  SpO2 98%  Wt Readings from Last 3 Encounters:  08/10/15 211 lb 3.2 oz (95.8 kg)  05/07/15 200 lb (90.719 kg)  11/20/14 191 lb 12.8 oz (87 kg)    General: Vital signs reviewed and noted.   Head: Normocephalic, atraumatic.  Eyes: conjunctivae/corneas clear.  EOM's intact.   Throat: normal  Neck:  normal   Lungs:    few rales, some consolidation in right base   Heart:  RR  Abdomen:  Soft, non-tender, non-distended    Extremities: No edema    Neurologic: A&O X3, CN II - XII are grossly intact. ,    Psych: Normal    Labs: BMET:  Recent Labs  08/09/15 0216 08/10/15 0234  NA 139 139  K 5.4* 5.7*  CL 102 103    CO2 27 26  GLUCOSE 143* 173*  BUN 70* 73*  CREATININE 3.81* 3.73*  CALCIUM 7.2* 7.3*  MG 2.0 2.0    Liver function tests:  Recent Labs  08/09/15 0216 08/10/15 0234  AST 14* 12*  ALT 10* 10*  ALKPHOS 80 75  BILITOT 0.5 0.5  PROT 5.6* 5.3*  ALBUMIN 2.1* 2.1*   CBC:  Recent Labs  08/09/15 0216 08/10/15 0234  WBC 5.9 6.2  NEUTROABS 3.7 4.5  HGB 9.8* 9.1*  HCT 34.2* 31.8*  MCV 89.5 89.3  PLT 217 227   Other results:  EKG  ( personally reviewed )  - NSR at 68.   Medications:    Infusions: . dextrose Stopped (08/07/15 1800)   Scheduled Medications: . antiseptic oral rinse  7 mL Mouth Rinse BID  . aspirin  325 mg Oral Daily  . atorvastatin  20 mg Oral q1800  . brimonidine  1 drop Right Eye Q12H   And  . timolol  1 drop Right Eye Q12H  . carvedilol  12.5 mg Oral BID WC  . furosemide  120 mg Intravenous 3 times per day  . heparin  5,000 Units Subcutaneous 3 times  per day  . hydrALAZINE  50 mg Oral 3 times per day  . insulin aspart  0-20 Units Subcutaneous TID WC  . insulin aspart  0-5 Units Subcutaneous QHS  . insulin detemir  7 Units Subcutaneous QHS  . isosorbide mononitrate  30 mg Oral Daily  . sodium chloride  3 mL Intravenous Q12H  . sodium polystyrene  30 g Oral Once    Assessment/ Plan:   Principal Problem:   CHF, acute on chronic (HCC) Active Problems:   Diabetes mellitus due to abnormal insulin (HCC)   Hyperlipidemia   CAD (coronary artery disease)   Obesity   Anemia   Essential hypertension   Acute renal failure superimposed on stage 3 chronic kidney disease (HCC)   Elevated troponin   CHF (congestive heart failure) (HCC)   Acute on chronic heart failure (HCC)   Somnolence   Acute respiratory failure with hypercapnia (HCC)   Respiratory acidosis   Hypernatremia   Systolic and diastolic CHF, acute (HCC)   Disorientation   Pulmonary vascular congestion   Pleural effusion, left   Acute on chronic renal failure (HCC)   HLD  (hyperlipidemia)   Noncompliance with medication regimen  1.  Chronic combined systolic and diastolic CHF :   Echo shows mild LV dysfunction with EF [id number], grade 1 diastolic dysfunction Mild MR, moderate TR, moderate pulmonary HTN with estimated PA pressure of 45.  Worsening kidney failure, worsening fluid overload not responding to diuretics, seen by renal today for potential dialysis, the patient doesn't want it but would consider if things get worse.  Palliative care was called. I think that even a short term of HD would be worth trying if the patient agrees.  2. Pulmonary HTN:   Continue imdur  3. Respiratory failure:   She has CO2 retention with acidosis which has improved with BIPAP and diuresis, now worsening.    4. Acute on chronic kidney disease stage IV  Creatinine is slighly worse today with diuresis. Awaiting patient's decision for potential HD, for now increased Lasix to 120 mg iv.  Disposition:  Length of Stay: 5  Dorothy Spark, MD, [facility] 08/10/2015, 10:06 AM

## 2015-08-11 DIAGNOSIS — R0600 Dyspnea, unspecified: Secondary | ICD-10-CM | POA: Diagnosis present

## 2015-08-11 DIAGNOSIS — Z7189 Other specified counseling: Secondary | ICD-10-CM | POA: Diagnosis present

## 2015-08-11 LAB — CBC
HCT: 32 % — ABNORMAL LOW (ref 36.0–46.0)
Hemoglobin: 9.2 g/dL — ABNORMAL LOW (ref 12.0–15.0)
MCH: 25.6 pg — AB (ref 26.0–34.0)
MCHC: 28.8 g/dL — AB (ref 30.0–36.0)
MCV: 89.1 fL (ref 78.0–100.0)
Platelets: 245 10*3/uL (ref 150–400)
RBC: 3.59 MIL/uL — ABNORMAL LOW (ref 3.87–5.11)
RDW: 15.9 % — AB (ref 11.5–15.5)
WBC: 4.9 10*3/uL (ref 4.0–10.5)

## 2015-08-11 LAB — GLUCOSE, CAPILLARY
GLUCOSE-CAPILLARY: 127 mg/dL — AB (ref 65–99)
GLUCOSE-CAPILLARY: 138 mg/dL — AB (ref 65–99)
Glucose-Capillary: 100 mg/dL — ABNORMAL HIGH (ref 65–99)

## 2015-08-11 LAB — RENAL FUNCTION PANEL
ALBUMIN: 2.1 g/dL — AB (ref 3.5–5.0)
ANION GAP: 10 (ref 5–15)
BUN: 77 mg/dL — AB (ref 6–20)
CALCIUM: 7.6 mg/dL — AB (ref 8.9–10.3)
CO2: 28 mmol/L (ref 22–32)
Chloride: 101 mmol/L (ref 101–111)
Creatinine, Ser: 3.71 mg/dL — ABNORMAL HIGH (ref 0.44–1.00)
GFR calc Af Amer: 13 mL/min — ABNORMAL LOW (ref >60)
GFR, EST NON AFRICAN AMERICAN: 11 mL/min — AB (ref >60)
Glucose, Bld: 185 mg/dL — ABNORMAL HIGH (ref 65–99)
PHOSPHORUS: 7 mg/dL — AB (ref 2.5–4.6)
POTASSIUM: 5.1 mmol/L (ref 3.5–5.1)
SODIUM: 139 mmol/L (ref 135–145)

## 2015-08-11 LAB — IRON AND TIBC
IRON: 26 ug/dL — AB (ref 28–170)
Saturation Ratios: 11 % (ref 10.4–31.8)
TIBC: 228 ug/dL — ABNORMAL LOW (ref 250–450)
UIBC: 202 ug/dL

## 2015-08-11 MED ORDER — FENTANYL CITRATE (PF) 100 MCG/2ML IJ SOLN
25.0000 ug | INTRAMUSCULAR | Status: DC | PRN
  Administered 2015-08-12: 25 ug via INTRAVENOUS
  Filled 2015-08-11: qty 2

## 2015-08-11 MED ORDER — NEPRO/CARBSTEADY PO LIQD
237.0000 mL | Freq: Two times a day (BID) | ORAL | Status: DC
  Administered 2015-08-12 – 2015-08-13 (×3): 237 mL via ORAL
  Filled 2015-08-11 (×4): qty 237

## 2015-08-11 NOTE — Progress Notes (Signed)
Initial Nutrition Assessment  DOCUMENTATION CODES:   Obesity unspecified  INTERVENTION:   Nepro Shake po BID, each supplement provides 425 kcal and 19 grams protein  NUTRITION DIAGNOSIS:   Inadequate oral intake related to poor appetite as evidenced by meal completion < 50%. GOAL:   Patient will meet greater than or equal to 90% of their needs  MONITOR:   PO intake, Supplement acceptance, Labs, Weight trends, Skin, I & O's  REASON FOR ASSESSMENT:   Consult Assessment of nutrition requirement/status  ASSESSMENT:   Kathleen [name] is a 70 y.o. female with a Past Medical History of DM, C KD, HTN, CAD, systolic CHF, HLD, anemia who presents with shortness of breath and acute on chronic systolic CHF with somnolence likely secondary to CHF. Will also obtain ABG and pneumonia to look for other sources of possible somnolence. Doubt related to polypharmacy or infection.  Pt admitted with CHF.   RD consulted by palliative care team, due to pt's family being concerned with pt's appetite.   Spoke with pt at bedside. No family present. Hx was difficult obtain as pt was falling in and out of sleep during interview. She reports her appetite was good PTA; she consumed 2 meals per day, which consisted of foods such as pork chop and cabbage. She denies any weight loss; noted UBW of 192#. Pt is currently receiving lasix for diuresis.   Pt reports that she has had poor appetite over the past couple of days because "I just don't feel like eating anything". Meal completion 0-25% within the past 3 days. She denies difficulty chewing or swallowing foods and reports she is receiving foods she likes on her meal trays. Pt shares with this RD that she likes "food with seasoning on it", however, is understanding of diet restrictions to assist with medical therapy. Per RN, pt ate a little bit better for breakfast this AM with encouragement.  Nutrition-Focused physical exam completed. Findings are no fat  depletion, no muscle depletion, and moderate edema.   Reviewed COWRN note from 08/07/15; pt with partial thickness wound on lt calf.   Palliative care team following; family meeting this morning. Pt is a DNR/DNI, with no heroic measures. Pt does not want HD and understands she would transition to comfort care if she were to require HD.   Case, findings, and plan discussed with RN.  Labs reviewed: Phos: 7.0, CBGS: 111-192.  Diet Order:  Diet renal/carb modified with fluid restriction Diet-HS Snack?: Nothing; Room service appropriate?: Yes; Fluid consistency:: Thin  Skin:  Wound (see comment) (open blister on lt posterior leg)  Last BM:  08/10/15  Height:   Ht Readings from Last 1 Encounters:  08/05/15 5\' 3"  (1.6 m)    Weight:   Wt Readings from Last 1 Encounters:  08/11/15 206 lb 2.1 oz (93.5 kg)    Ideal Body Weight:  52.2 kg  BMI:  Body mass index is 36.52 kg/(m^2).  Estimated Nutritional Needs:   Kcal:  1450-1650  Protein:  46-56 grams  Fluid:  per MD  EDUCATION NEEDS:   Education needs addressed  Owens Hara A. Jimmye Norman, RD, LDN, CDE Pager: [phone] After hours Pager: [phone]

## 2015-08-11 NOTE — Progress Notes (Signed)
Daily Progress Note   Patient Name: Kathleen [name]       Date: 08/11/2015 DOB: 1945/12/15  Age: 70 y.o. MRN#: [id number] Attending Physician: Allie Bossier, MD Primary Care Physician: Benito Mccreedy, MD Admit Date: 08/05/2015  Reason for Consultation/Follow-up: Establishing goals of care and Non pain symptom management  Subjective:  Back pain improved with fentanyl.  Increased dyspnea.   Interval Events: Family Meeting Note  Advance Directive: no  Today a meeting took place with the Patient, ex-husband, 2 sons Kathleen [name] and Kathleen [name]) and daughter .  Patient is only able to minimally participate due to lethargy and fatigue.  The following clinical team members were present during this meeting:MD, PA-C  The following were discussed:Patient's diagnosis: , Patient's progosis: Unable to determine and Goals for treatment: aggressive medical management towards the goal of stabilization, and treatment of symptoms.  No heroic measures.  No invasive treatments.  DNR / DNI, no peg tube.  Additional follow-up to be provided: yes.  Palliative will round again, likely 1/4 to continue symptom management  Time spent during discussion: 9:00 - 9:35 am  York, Alfonsa Vaile L, PA-C     Length of Stay: 6 days  Current Medications: Scheduled Meds:  . antiseptic oral rinse  7 mL Mouth Rinse BID  . aspirin  325 mg Oral Daily  . atorvastatin  20 mg Oral q1800  . brimonidine  1 drop Right Eye Q12H   And  . timolol  1 drop Right Eye Q12H  . carvedilol  12.5 mg Oral BID WC  . furosemide  120 mg Intravenous 3 times per day  . heparin  5,000 Units Subcutaneous 3 times per day  . hydrALAZINE  50 mg Oral 3 times per day  . insulin aspart  0-20 Units Subcutaneous TID WC  . insulin aspart  0-5 Units  Subcutaneous QHS  . insulin detemir  7 Units Subcutaneous QHS  . isosorbide mononitrate  30 mg Oral Daily    Continuous Infusions:    PRN Meds: acetaminophen, fentaNYL (SUBLIMAZE) injection, meclizine, ondansetron (ZOFRAN) IV  Physical Exam: Physical Exam  Constitutional: She appears well-developed. She appears lethargic.  Cardiovascular: Tachycardia present.   Neurological: She appears lethargic.  Lungs:  Scattered Rales, Mildly increased work of breathing.       Abdomen:  Soft, NT, ND Extremities:  1-2+ edema.      Vital Signs: BP 140/77 mmHg  Pulse 80  Temp(Src) 98.3 F (36.8 C) (Oral)  Resp 19  Ht 5\' 3"  (1.6 m)  Wt 93.5 kg (206 lb 2.1 oz)  BMI 36.52 kg/m2  SpO2 96% SpO2: SpO2: 96 % O2 Device: O2 Device: Nasal Cannula O2 Flow Rate: O2 Flow Rate (L/min): 4 L/min  Intake/output summary:  Intake/Output Summary (Last 24 hours) at 08/11/15 0926 Last data filed at 08/11/15 0600  Gross per 24 hour  Intake    626 ml  Output   1350 ml  Net   -724 ml   LBM: Last BM Date: 08/10/15 Baseline Weight: Weight: 93 kg (205 lb 0.4 oz) Most recent weight: Weight: 93.5 kg (206 lb 2.1 oz)       Palliative Assessment/Data: Flowsheet Rows        Most Recent Value   Intake Tab    Clinical Assessment    Palliative Performance Scale Score  30%   Pain Max last 24 hours  4   Pain Min Last 24 hours  3   Dyspnea Max Last 24 Hours  5   Dyspnea Min Last 24 hours  5   Psychosocial & Spiritual Assessment    Palliative Care Outcomes    Patient/Family meeting held?  Yes   Who was at the meeting?  ex husband, two sons, daughter , patient   Palliative Care Outcomes  Improved pain interventions, Clarified goals of care, Changed CPR status, Completed durable DNR   Patient/Family wishes: Interventions discontinued/not started   Mechanical Ventilation, Hemodialysis   Palliative Care follow-up planned  Yes, Facility      Additional Data Reviewed: CBC    Component Value Date/Time   WBC  4.9 08/11/2015 0227   RBC 3.59* 08/11/2015 0227   HGB 9.2* 08/11/2015 0227   HCT 32.0* 08/11/2015 0227   PLT 245 08/11/2015 0227   MCV 89.1 08/11/2015 0227   MCH 25.6* 08/11/2015 0227   MCHC 28.8* 08/11/2015 0227   RDW 15.9* 08/11/2015 0227   LYMPHSABS 0.8 08/10/2015 0234   MONOABS 0.6 08/10/2015 0234   EOSABS 0.3 08/10/2015 0234   BASOSABS 0.0 08/10/2015 0234    CMP     Component Value Date/Time   NA 139 08/11/2015 0227   K 5.1 08/11/2015 0227   CL 101 08/11/2015 0227   CO2 28 08/11/2015 0227   GLUCOSE 185* 08/11/2015 0227   BUN 77* 08/11/2015 0227   CREATININE 3.71* 08/11/2015 0227   CALCIUM 7.6* 08/11/2015 0227   PROT 5.3* 08/10/2015 0234   ALBUMIN 2.1* 08/11/2015 0227   AST 12* 08/10/2015 0234   ALT 10* 08/10/2015 0234   ALKPHOS 75 08/10/2015 0234   BILITOT 0.5 08/10/2015 0234   GFRNONAA 11* 08/11/2015 0227   GFRAA 13* 08/11/2015 0227       Problem List:  Patient Active Problem List   Diagnosis Date Noted  . SOB (shortness of breath)   . Encounter for palliative care   . Pulmonary vascular congestion   . Pleural effusion, left   . Acute on chronic renal failure ([place])   . HLD (hyperlipidemia)   . Noncompliance with medication regimen   . Disorientation   . Somnolence   . Acute respiratory failure with hypercapnia ([place])   . Respiratory acidosis   . Hypernatremia   . Systolic and diastolic CHF, acute ([place])   . CHF, acute on chronic ([place]) 08/05/2015  . Acute renal failure superimposed on stage 3  chronic kidney disease ([place]) 08/05/2015  . Elevated troponin 08/05/2015  . CHF (congestive heart failure) ([place]) 08/05/2015  . Acute on chronic heart failure ([place]) 08/05/2015  . Acute on chronic combined systolic and diastolic congestive heart failure ([place])   . AKI (acute kidney injury) ([place])   . Left carotid bruit 11/20/2014  . Syncope and collapse 06/10/2014  . Syncope 06/10/2014  . Essential hypertension   . Unspecified vitamin D deficiency   . Cellulitis  and abscess of right lower extremitiy 02/28/2014  . Chronic combined systolic and diastolic heart failure ([place]) 02/12/2014  . CAD (coronary artery disease)   . Obesity   . Meningioma ([place])   . Anemia   . CKD stage 3 due to type 2 diabetes mellitus ([place]) 01/24/2014  . Hyperlipidemia 01/22/2014  . BPPV (benign paroxysmal positional vertigo) 01/22/2014  . Tobacco abuse 01/21/2014  . Diabetes mellitus due to abnormal insulin ([place]) 01/21/2014     Palliative Care Assessment & Plan    1.Code Status:  DNR    Code Status Orders        Start     Ordered   08/11/15 0922  Do not attempt resuscitation (DNR)   Continuous    Question Answer Comment  In the event of cardiac or respiratory ARREST Do not call a "code blue"   In the event of cardiac or respiratory ARREST Do not perform Intubation, CPR, defibrillation or ACLS   In the event of cardiac or respiratory ARREST Use medication by any route, position, wound care, and other measures to relive pain and suffering. May use oxygen, suction and manual treatment of airway obstruction as needed for comfort.   Comments NO Dialysis      08/11/15 0923       2. Goals of Care/Additional Recommendations:  DNR / DNI  No hemodialysis.  Limitations on Scope of Treatment: Full scope of medical treatment  Desire for further Chaplaincy support: No  Psycho-social Needs: Caregiving  Support/Resources  3. Symptom Management:    Increased fentanyl to 25 mcg q 2 hours PRN pain, dyspnea  4. Palliative Prophylaxis:   Bowel Regimen, Frequent Pain Assessment and Turn Reposition  5. Prognosis: < 12 months  6. Discharge Planning:  [place] for rehab with Palliative care service follow-up vs home with home health services.   Care plan was discussed with ex husband, 2 sons, daughter.  Thank you for allowing the Palliative Medicine Team to assist in the care of this patient.   Time In: 9:00 Time Out: 9:35 Total Time 35 min  Prolonged Time Billed no        Melton Alar, PA-C  08/11/2015, 9:26 AM  Please contact Palliative Medicine Team phone at [phone] for questions and concerns.

## 2015-08-11 NOTE — Progress Notes (Signed)
PROGRESS NOTE  Subjective:    70 y.o. female with a history of HTN, DM, HLD, CKD (06/12/14 Baseline Cr 2.2 ), carotid artery disease, tobacco abuse (quit 3 months ago), dementia and chronic mixed S/D CHF (EF recently improved), Admitted to [facility] with altered mental status and a fall.  Has been found to have acute worsening of her renal function.  She has improved in the first two days, but now deteriorated, worsening kidney failure and worsening dyspnea.  Palliative care has planned a family meeting today .   Objective:    Vital Signs:   Temp:  [98 F (36.7 C)-98.6 F (37 C)] 98.3 F (36.8 C) (01/03 0730) Pulse Rate:  [63-81] 80 (01/03 0730) Resp:  [12-21] 19 (01/03 0730) BP: (100-173)/(51-79) 140/77 mmHg (01/03 0730) SpO2:  [84 %-100 %] 96 % (01/03 0730) Weight:  [206 lb 2.1 oz (93.5 kg)] 206 lb 2.1 oz (93.5 kg) (01/03 0400)  Last BM Date: 08/10/15   24-hour weight change: Weight change: -5 lb 1.1 oz (-2.3 kg)  Weight trends: Filed Weights   08/09/15 0431 08/10/15 0407 08/11/15 0400  Weight: 205 lb 14.6 oz (93.4 kg) 211 lb 3.2 oz (95.8 kg) 206 lb 2.1 oz (93.5 kg)   Intake/Output:  01/02 0701 - 01/03 0700 In: 626 [P.O.:502; IV Piggyback:124] Out: 1350 [Urine:1350]    Physical Exam: BP 140/77 mmHg  Pulse 80  Temp(Src) 98.3 F (36.8 C) (Oral)  Resp 19  Ht 5\' 3"  (1.6 m)  Wt 206 lb 2.1 oz (93.5 kg)  BMI 36.52 kg/m2  SpO2 96%  Wt Readings from Last 3 Encounters:  08/11/15 206 lb 2.1 oz (93.5 kg)  05/07/15 200 lb (90.719 kg)  11/20/14 191 lb 12.8 oz (87 kg)    General: Vital signs reviewed and noted.   Head: Normocephalic, atraumatic.  Eyes: conjunctivae/corneas clear.  EOM's intact.   Throat: normal  Neck:  normal   Lungs:    few rales, some consolidation in right base   Heart:  RR  Abdomen:  Soft, non-tender, non-distended    Extremities: No edema    Neurologic: A&O X3, CN II - XII are grossly intact. ,    Psych: Sleepy but responsive this am     Labs: BMET:  Recent Labs  08/09/15 0216 08/10/15 0234 08/11/15 0227  NA 139 139 139  K 5.4* 5.7* 5.1  CL 102 103 101  CO2 27 26 28   GLUCOSE 143* 173* 185*  BUN 70* 73* 77*  CREATININE 3.81* 3.73* 3.71*  CALCIUM 7.2* 7.3* 7.6*  MG 2.0 2.0  --   PHOS  --   --  7.0*    Liver function tests:  Recent Labs  08/09/15 0216 08/10/15 0234 08/11/15 0227  AST 14* 12*  --   ALT 10* 10*  --   ALKPHOS 80 75  --   BILITOT 0.5 0.5  --   PROT 5.6* 5.3*  --   ALBUMIN 2.1* 2.1* 2.1*   CBC:  Recent Labs  08/09/15 0216 08/10/15 0234 08/11/15 0227  WBC 5.9 6.2 4.9  NEUTROABS 3.7 4.5  --   HGB 9.8* 9.1* 9.2*  HCT 34.2* 31.8* 32.0*  MCV 89.5 89.3 89.1  PLT 217 227 245   Other results:  EKG  ( personally reviewed )  - NSR at 85  Medications:    Infusions:   Scheduled Medications: . antiseptic oral rinse  7 mL Mouth Rinse BID  . aspirin  325 mg Oral Daily  .  atorvastatin  20 mg Oral q1800  . brimonidine  1 drop Right Eye Q12H   And  . timolol  1 drop Right Eye Q12H  . carvedilol  12.5 mg Oral BID WC  . furosemide  120 mg Intravenous 3 times per day  . heparin  5,000 Units Subcutaneous 3 times per day  . hydrALAZINE  50 mg Oral 3 times per day  . insulin aspart  0-20 Units Subcutaneous TID WC  . insulin aspart  0-5 Units Subcutaneous QHS  . insulin detemir  7 Units Subcutaneous QHS  . isosorbide mononitrate  30 mg Oral Daily    Assessment/ Plan:   Principal Problem:   CHF, acute on chronic (HCC) Active Problems:   Diabetes mellitus due to abnormal insulin (HCC)   Hyperlipidemia   CAD (coronary artery disease)   Obesity   Anemia   Essential hypertension   Acute renal failure superimposed on stage 3 chronic kidney disease (HCC)   Elevated troponin   CHF (congestive heart failure) (HCC)   Acute on chronic heart failure (HCC)   Somnolence   Acute respiratory failure with hypercapnia (HCC)   Respiratory acidosis   Hypernatremia   Systolic and diastolic  CHF, acute (HCC)   Disorientation   Pulmonary vascular congestion   Pleural effusion, left   Acute on chronic renal failure (HCC)   HLD (hyperlipidemia)   Noncompliance with medication regimen   SOB (shortness of breath)   Encounter for palliative care  1.  Chronic combined systolic and diastolic CHF :   Echo shows mild LV dysfunction with EF [id number], grade 1 diastolic dysfunction Mild MR, moderate TR, moderate pulmonary HTN with estimated PA pressure of 45.  Worsening kidney failure, worsening fluid overload not responding to diuretics, seen by renal today for potential dialysis, the patient doesn't want it but would consider if things get worse.  Palliative care was called.  2. Pulmonary HTN:   Continue imdur  3. Respiratory failure:   She has CO2 retention with acidosis which has improved with BIPAP and diuresis, now worsening.    4. Acute on chronic kidney disease stage IV  Creatinine is slighly worse today with diuresis. Awaiting patient's decision for potential HD, for now increased Lasix to 120 mg iv.  Disposition:  Length of Stay: 6  Lynn Sissel, Wonda Cheng, MD, [facility] 08/11/2015, 8:51 AM

## 2015-08-11 NOTE — Progress Notes (Addendum)
Kathleen [name] Rounding Note  Subjective:   Seems sleepier today - but wakes up, very appropriate and in no distress  Said yesterday that she would not undergo dialysis (her mother, Kathleen [name], was on HD) Family met with palliative today - DNR/DNI/no dialysis She reiterates that decision to me and is comfortable with it.   Objective  Vital signs in last 24 hours: Filed Vitals:   08/11/15 0400 08/11/15 0500 08/11/15 0517 08/11/15 0730  BP: 129/51  171/69 140/77  Pulse: 80 81  80  Temp: 98.4 F (36.9 C)   98.3 F (36.8 C)  TempSrc: Oral   Oral  Resp: 18 18  19  Height:      Weight: 93.5 kg (206 lb 2.1 oz)     SpO2: 96% 100%  96%   Weight change: -2.3 kg (-5 lb 1.1 oz)  Intake/Output Summary (Last 24 hours) at 08/11/15 0949 Last data filed at 08/11/15 0600  Gross per 24 hour  Intake    626 ml  Output   1350 ml  Net   -724 ml    Physical Exam: BP 140/77 mmHg  Pulse 80  Temp(Src) 98.3 F (36.8 C) (Oral)  Resp 19  Ht 5' 3" (1.6 m)  Wt 93.5 kg (206 lb 2.1 oz)  BMI 36.52 kg/m2  SpO2 96%  Elderly AAF lying in bed, appears comfortable Awakens easily, very appropriate +JVD Crackles bilaterally somewhat less Regular S1S2 No S3S4 1/6 murmur LSB Abd obese soft ntnd  GU foley in place LE's/ UE's 1-2+ pitting edema bilat SCD's in place  Labs:   Recent Labs Lab 08/06/15 0300 08/06/15 0940 08/07/15 0505 08/08/15 0324 08/09/15 0216 08/10/15 0234 08/11/15 0227  NA 149* 148* 147* 143 139 139 139  K 5.3* 5.2* 4.8 5.0 5.4* 5.7* 5.1  CL 112* 113* 110 107 102 103 101  CO2 25 24 26 27 27 26 28  GLUCOSE 109* 76 106* 178* 143* 173* 185*  BUN 58* 60* 57* 64* 70* 73* 77*  CREATININE 3.80* 3.82* 3.60* 3.73* 3.81* 3.73* 3.71*  CALCIUM 6.9* 7.0* 7.1* 6.9* 7.2* 7.3* 7.6*  PHOS  --   --   --   --   --   --  7.0*     Recent Labs Lab 08/08/15 0324 08/09/15 0216 08/10/15 0234 08/11/15 0227  AST 12* 14* 12*  --   ALT 10* 10* 10*  --   ALKPHOS 72 80  75  --   BILITOT 0.4 0.5 0.5  --   PROT 5.1* 5.6* 5.3*  --   ALBUMIN 2.1* 2.1* 2.1* 2.1*     Recent Labs Lab 08/05/15 1603  AMMONIA 15     Recent Labs Lab 08/07/15 0505 08/08/15 0324 08/09/15 0216 08/10/15 0234 08/11/15 0227  WBC 4.3 5.5 5.9 6.2 4.9  NEUTROABS 2.6 3.4 3.7 4.5  --   HGB 9.2* 9.0* 9.8* 9.1* 9.2*  HCT 31.8* 31.7* 34.2* 31.8* 32.0*  MCV 88.1 88.8 89.5 89.3 89.1  PLT 260 209 217 227 245     Recent Labs Lab 08/05/15 2200 08/06/15 0300  TROPONINI 0.10* 0.10*     Recent Labs Lab 08/10/15 0807 08/10/15 1124 08/10/15 1654 08/10/15 2141 08/11/15 0733  GLUCAP 119* 111* 155* 192* 127*   UA - Protein >300 0-5 rbc, 0-5 wbc, 0-5 epi CXR 12/29 vasc congestion  Medications:   . antiseptic oral rinse  7 mL Mouth Rinse BID  . aspirin  325 mg Oral Daily  . atorvastatin    20 mg Oral q1800  . brimonidine  1 drop Right Eye Q12H   And  . timolol  1 drop Right Eye Q12H  . carvedilol  12.5 mg Oral BID WC  . furosemide  120 mg Intravenous 3 times per day  . heparin  5,000 Units Subcutaneous 3 times per day  . hydrALAZINE  50 mg Oral 3 times per day  . insulin aspart  0-20 Units Subcutaneous TID WC  . insulin aspart  0-5 Units Subcutaneous QHS  . insulin detemir  7 Units Subcutaneous QHS  . isosorbide mononitrate  30 mg Oral Daily    Background:  70 y.o. year-old with hx of HTN, DM2, CAD, CHF and CKD who presented on 12/28 with confusion, SOB, +orthopnea, sig LE edema on admission. Admitted w dx of a/c diast HF, hypercapnic resp failure and started on IV lasix. ECHO 12/29 LVEF 40-45%.  Known (progressive) CKD with most recent baseline creatinine prior to admission of 2.52 04/2015 (GFR 21 Stage 4) and on admission 12/28 was 3.8 (GFR 13). Had never seen Nephrology as outpt. We were asked to see. She has decided AGAINST any form of renal replacement therapy - either short or long term. .  Assessment/Recommendations  1. AKI on CKD4 - renal function has not  improved since admission (though has not worsened either).  GFR around 13. Diuresing a little better with 120 TID IV lasix. She has indicated that she WOULD NOT undergo dialysis treatments. Understands that if no dialysis when the time comes, then comfort measures only option. Palliative Care has seen and their note reviewed.   2. Volume overload/edema - continue IV lasix for now, transition to po tomorrow. She will be more comfortable if we can get her a little drier. 3. Hyperkalemia - improved post kayexalate 1/2 4. Anemia -TSat low at 11%. Given plans and goals of care will not embark on expensive IV fe replacement just now 5. Acute systolic and diastolic CHF 6. DM 7. HTN 8. Hypercapnic resp failure - improved some  9. LLE calf wound. WOC seeing. Partial thickness skin loss described.    , MD [name] [phone] Pager 08/11/2015, 9:49 AM    

## 2015-08-11 NOTE — Progress Notes (Signed)
Pt placed on Bipap by RN due to SpO2 desat to mid 80% ranged.  Pt tolerating well. RT will continue to monitor.

## 2015-08-11 NOTE — Progress Notes (Signed)
Kathleen [name] - Stepdown/ICU TEAM Progress Note  Kathleen [name] [id number] DOB: Oct 18, 1945 DOA: 08/05/2015 PCP: Benito Mccreedy, MD  Admit HPI / Brief Narrative: 70 y.o. BF PMHx HTN, DM Type 2, HLD, CKD stage III, CAD native artery, Chronic Combined CHF (01/2014 EF 25-30% by echo),   Presents the emergency department with chief complaint of gradual worsening shortness of breath, increased lower extremity edema and worsening orthopnea. Initial evaluation reveals acute on chronic combined heart failure.  Information is obtained from the patient and the daughter who is at the bedside. Patient reports a gradual worsening shortness of breath of the last 4 weeks. Should symptoms include sleep lower extremity edema and orthopnea. She reports that she has Lasix on her home medication list and she took some yesterday and the day before but she does not take daily. She denies chest pain palpitations headache dizziness syncope or near-syncope. She denies abdominal pain nausea vomiting. She denies dysuria hematuria ferequency or urgency.  In the emergency department she is afebrile hemodynamically stable oxygen saturation level 90% on room air. She is placed on 2 L nasal cannula.   HPI/Subjective: [name]/3  A/O x 4. States does not weigh Daily and does not know Base Wt.  negative CP, negative SOB.  States uses walker for ambulation home. Slight increase SOB   Assessment/Plan: AMS -Most likely secondary to hypercapnia; resolved -PLEASE AMBULATE PATIENT WITH WALKER and ASSISTANCE q SHIFT -Obtain ambulatory SPO2 and record in appropriate Epic form  Acute REspiratory Failure w/ Hypercapnia/Respiratory Acidosis -Continue BiPAP PRN, currently on 2 L O2 via Terry doing well - goal SPO2> 90%  Pulmonary Vascular Congestion/Lt Pleural Effusion? -See CHF  Acute on chronic renal failure stage III(Baseline ~2.5) -Improving with diuresis   -Patient's renal failure has worsened over the last year consulted  nephrology; may be moving toward HD -Per Nephrology needs further diuresis; not good HD candidate -per Nephrology lasix IV 120 mg TID  -Cr [id number]  Acute Systolic and diastolic CHF - [id number] echocardiogram see results below  -Strict I&O since admission; - 4.0 L -Daily a.m. Admission weight= 93 kg         [name]/3 bed weight= 93.5 kg  Hypernatremia -Resolved  HLD -Continue Lipitor 20 mg daily  Noncompliance with medication -Patient not following with cardiologist as required. Counseled patient on sequela of continuing to be noncompliant to include death -PT; recommends SNF; /OT; recommends Home Health      Code Status: FULL Family Communication: no family present at time of exam Disposition Plan: Resolution fluid overload    Consultants: NA  Procedure/Significant Events: 12/29 echocardiogram;- Left ventricle: severeconcentric hypertrophy. -LVEF= 40% to 45%. - (grade 1diastolic dysfunction). - Tricuspid valve: moderate regurgitation. - Pulmonary arteries: PA peak pressure: 45 mm Hg (S).    Culture 12/28 MRSA by PCR negative  Antibiotics: NA  DVT prophylaxis: Subcutaneous heparin   Devices    LINES / TUBES:      Continuous Infusions:    Objective: VITAL SIGNS: Temp: 98.3 F (36.8 C) (01/03 1633) Temp Source: Oral (01/03 1633) BP: 109/60 mmHg (01/03 1633) Pulse Rate: 76 (01/03 1633) BiPAP Set RR; 18 IPAP; 17 EPAP; 5 O2; 30%    Intake/Output Summary (Last 24 hours) at 08/11/15 1915 Last data filed at 08/11/15 1500  Gross per 24 hour  Intake    386 ml  Output   1500 ml  Net  -1114 ml     Exam: General:  A/O x 4, negative acute respiratory distress Eyes: Negative  headache, eye pain, double vision,negative scleral hemorrhage ENT: Negative Runny nose, negative ear pain, negative gingival bleeding, Neck:  Negative scars, masses, torticollis, lymphadenopathy, JVD Lungs:  clear to auscultation bilateral,  negative  wheezes or  crackles Cardiovascular: Regular rate and rhythm without murmur gallop or rub normal S1 and S2 Abdomen:negative abdominal pain, nondistended, positive soft, bowel sounds, no rebound, no ascites, no appreciable mass Extremities: No significant cyanosis, clubbing, or edema bilateral lower extremities Psychiatric:  Negative depression, negative anxiety, negative fatigue, negative mania  Neurologic:  Cranial nerves II through XII intact, tongue/uvula midline, all extremities muscle strength 5/5, sensation intact throughout,  negative dysarthria, negative expressive aphasia, negative receptive aphasia.     Data Reviewed: Basic Metabolic Panel:  Recent Labs Lab 08/06/15 0940 08/07/15 0505 08/08/15 0324 08/09/15 0216 08/10/15 0234 08/11/15 0227  NA 148* 147* 143 139 139 139  K 5.2* 4.8 5.0 5.4* 5.7* 5.[name]  CL 113* 110 107 102 103 101  CO2 24 26 27 27 26 28   GLUCOSE 76 106* 178* 143* 173* 185*  BUN 60* 57* 64* 70* 73* 77*  CREATININE 3.82* 3.60* 3.73* 3.81* 3.73* 3.71*  CALCIUM 7.0* 7.[name]* 6.9* 7.2* 7.3* 7.6*  MG [name].6* [name].6* 2.[name] 2.0 2.0  --   PHOS  --   --   --   --   --  7.0*   Liver Function Tests:  Recent Labs Lab 08/06/15 0940 08/07/15 0505 08/08/15 0324 08/09/15 0216 08/10/15 0234 08/11/15 0227  AST 17 12* 12* 14* 12*  --   ALT 14 11* 10* 10* 10*  --   ALKPHOS 82 75 72 80 75  --   BILITOT 0.4 0.7 0.4 0.5 0.5  --   PROT 5.[name]* 5.2* 5.[name]* 5.6* 5.3*  --   ALBUMIN 2.3* 2.[name]* 2.[name]* 2.[name]* 2.[name]* 2.[name]*   No results for input(s): LIPASE, AMYLASE in the last 168 hours.  Recent Labs Lab 08/05/15 1603  AMMONIA 15   CBC:  Recent Labs Lab 08/06/15 1140 08/07/15 0505 08/08/15 0324 08/09/15 0216 08/10/15 0234 08/11/15 0227  WBC 4.3 4.3 5.5 5.9 6.2 4.9  NEUTROABS 2.7 2.6 3.4 3.7 4.5  --   HGB 9.7* 9.2* 9.0* 9.8* 9.[name]* 9.2*  HCT 33.9* 31.8* 31.7* 34.2* 31.8* 32.0*  MCV 91.6 88.[name] 88.8 89.5 89.3 89.[name]  PLT 221 260 209 217 227 245   Cardiac Enzymes:  Recent Labs Lab 08/05/15 2200  08/06/15 0300  TROPONINI 0.10* 0.10*   BNP (last 3 results)  Recent Labs  08/05/15 1130  BNP 892.4*    ProBNP (last 3 results) No results for input(s): PROBNP in the last 8760 hours.  CBG:  Recent Labs Lab 08/10/15 1654 08/10/15 2141 08/11/15 0733 08/11/15 1133 08/11/15 1636  GLUCAP 155* 192* 127* 100* 138*    Recent Results (from the past 240 hour(s))  MRSA PCR Screening     Status: None   Collection Time: 08/05/15  6:53 PM  Result Value Ref Range Status   MRSA by PCR NEGATIVE NEGATIVE Final    Comment:        The GeneXpert MRSA Assay (FDA approved for NASAL specimens only), is one component of a comprehensive MRSA colonization surveillance program. It is not intended to diagnose MRSA infection nor to guide or monitor treatment for MRSA infections.      Studies:  Recent x-ray studies have been reviewed in detail by the Attending Physician  Scheduled Meds:  Scheduled Meds: . antiseptic oral rinse  7 mL Mouth Rinse BID  .  aspirin  325 mg Oral Daily  . atorvastatin  20 mg Oral q1800  . brimonidine  [name] drop Right Eye Q12H   And  . timolol  [name] drop Right Eye Q12H  . carvedilol  12.5 mg Oral BID WC  . [START ON [name]/11/2015] feeding supplement (NEPRO CARB STEADY)  237 mL Oral BID BM  . furosemide  120 mg Intravenous 3 times per day  . heparin  5,000 Units Subcutaneous 3 times per day  . hydrALAZINE  50 mg Oral 3 times per day  . insulin aspart  0-20 Units Subcutaneous TID WC  . insulin aspart  0-5 Units Subcutaneous QHS  . insulin detemir  7 Units Subcutaneous QHS  . isosorbide mononitrate  30 mg Oral Daily    Time spent on care of this patient: 40 mins   WOODS, Geraldo Docker , MD  [facility] Office  [phone] Pager [phone]  On-Call/Text Page:      [email or web address]      password [email or web address]  If 7PM-7AM, please contact night-coverage [email or web address] Password [email or web address] [name]/10/2015, 7:15 PM   LOS: 6 days   Care during the described time interval was  provided by me .  I have reviewed this patient's available data, including medical history, events of note, physical examination, and all test results as part of my evaluation. I have personally reviewed and interpreted all radiology studies.   Dia Crawford, MD [phone] Pager

## 2015-08-11 NOTE — Progress Notes (Signed)
Occupational Therapy Treatment Patient Details Name: Kathleen [name] MRN: [id number] DOB: [birth year] Today's Date: 08/11/2015    History of present illness pt presents with Acute on Chronic CHF, a Fall, and AMS.  pt with hx of HTN, DM, CKD, CAD, CHF, and Dementia.     OT comments  Per note, pt is transitioning to comfort care with palliative care involved.  Per Palliative notes, family wishes to take pt home.  Pt requires an extensive amount of physical assist for ADLs and basic mobility.  She requires max - total A for LB ADLs and toileting. She required max A to perform squat pivot transfer to recliner (required three attempts before she was successful and pt sat on armrest of recliner mid transfer).  As she fatigues through the day, she will likely be unable to transfer and it was suggested nursing use a lift to assist her back to bed.   IF family wishes to take pt home, she will likely require 2 people to transfer her safely.   Recommend hospital bed, BSC, hoyer lift, w/c as well as HHaide, and PT, OT.  IF family unable to provide this level of care, likely will need SNF or residential hospice if she is appropriate for this.   Follow Up Recommendations  Home health OT;Other (comment) (HHRN, HHaid, HHPT, HHOT)    Equipment Recommendations  3 in 1 bedside comode;Wheelchair (measurements OT);Hospital bed;Other (comment) (hoyer lift )    Recommendations for Other Services      Precautions / Restrictions Precautions Precautions: Fall Restrictions Weight Bearing Restrictions: No       Mobility Bed Mobility Overal bed mobility: Needs Assistance Bed Mobility: Supine to Sit     Supine to sit: Max assist     General bed mobility comments: Pt able to assist minimally with lifting trunk and moving LEs off bed   Transfers Overall transfer level: Needs assistance   Transfers: Squat Pivot Transfers   Stand pivot transfers: Max assist       General transfer comment: Attempted to use  Stedy but pt unable despite max A.  Pad used as a sling to lift her hips.  Pt required 3 attempts before being able to pivot successfully.  Pt with significant difficulty pivoting feet during transfers.  On the third attempt, pt was able to pivot to chair, but sat on armrest mid transfer     Balance Overall balance assessment: Needs assistance Sitting-balance support: Feet supported Sitting balance-Leahy Scale: Fair     Standing balance support: Bilateral upper extremity supported Standing balance-Leahy Scale: Zero                     ADL Overall ADL's : Needs assistance/impaired             Lower Body Bathing: Maximal assistance;Sit to/from stand;Bed level       Lower Body Dressing: Total assistance;Sit to/from stand;Bed level   Toilet Transfer: Maximal assistance;Squat-pivot;BSC   Toileting- Clothing Manipulation and Hygiene: Total assistance;Sit to/from stand;Bed level       Functional mobility during ADLs: Maximal assistance General ADL Comments: Pt states she is home by herself most of the day.  No family present to confirm their availability to assist her at discharge.       Vision                     Perception     Praxis      Cognition   Behavior During  Therapy: WFL for tasks assessed/performed Overall Cognitive Status: No family/caregiver present to determine baseline cognitive functioning                       Extremity/Trunk Assessment               Exercises     Shoulder Instructions       General Comments      Pertinent Vitals/ Pain       Pain Assessment: Faces Faces Pain Scale: Hurts even more Pain Location: hips and knees due to arthritis Pain Descriptors / Indicators: Aching;Guarding;Grimacing Pain Intervention(s): Monitored during session;Premedicated before session;Limited activity within patient's tolerance  Home Living Family/patient expects to be discharged to:: Private residence Living  Arrangements: Children Available Help at Discharge: Family Type of Home: House Home Access: Stairs to enter Technical brewer of Steps: 2                              Prior Functioning/Environment              Frequency Min 2X/week     Progress Toward Goals  OT Goals(current goals can now be found in the care plan section)  Progress towards OT goals: Progressing toward goals  ADL Goals Pt Will Perform Grooming: sitting;bed level;with mod assist (2 tasks) Pt Will Transfer to Toilet: with mod assist;with +2 assist;squat pivot transfer;stand pivot transfer;bedside commode Pt Will Perform Toileting - Clothing Manipulation and hygiene:  (pt will be able to stand with Mod A +2 for task) Pt/caregiver will Perform Home Exercise Program: Increased ROM;Increased strength;Both right and left upper extremity (with caregiver A'ing for general AROM and hold) Additional ADL Goal #1: Pt will roll left and right with Mod A to A with basic ADLs Additional ADL Goal #2: Pt will be able to sit EOB with no more than Mod A for 5 minutes as precursor to transfers  Plan Discharge plan needs to be updated;Equipment recommendations need to be updated    Co-evaluation                 End of Session Equipment Utilized During Treatment: Oxygen   Activity Tolerance Patient limited by pain   Patient Left in chair;with call bell/phone within reach   Nurse Communication Mobility status;Need for lift equipment        Time: 1201-1230 OT Time Calculation (min): 29 min  Charges: OT General Charges $OT Visit: 1 Procedure OT Treatments $Therapeutic Activity: 23-37 mins  Jaken Fregia M 08/11/2015, 12:50 PM

## 2015-08-11 NOTE — Care Management Note (Signed)
Case Management Note  Patient Details  Name: LAKYAH KOBE MRN: [id number] Date of Birth: 08-04-1946  Subjective/Objective:           Adm w chf         Action/Plan:lives w fam   Expected Discharge Date:                  Expected Discharge Plan:  Portal  In-House Referral:     Discharge planning Services  CM Consult  Post Acute Care Choice:  Home Health Choice offered to:  Patient  DME Arranged:    DME Agency:     HH Arranged:  RN, Disease Management, PT, OT [place] Agency:  [place]  Status of Service:     Medicare Important Message Given:  Yes Date Medicare IM Given:    Medicare IM give by:    Date Additional Medicare IM Given:    Additional Medicare Important Message give by:     If discussed at [place] of Stay Meetings, dates discussed:    Additional Comments: pal care saw pt/fam. They have decided on hhc at disch. Spoke w pt/fam and gave them hhc agency list. Pt has used [facility] in past and would like to use them again. Have made ref to donna w [facility] for hhrn-hhpt-hhot. Will cont to follow  Lacretia Leigh, RN 08/11/2015, 9:58 AM

## 2015-08-11 NOTE — Progress Notes (Signed)
Physical Therapy Treatment Patient Details Name: Kathleen [name] MRN: [id number] DOB: 12-09-1945 Today's Date: 08/11/2015    History of Present Illness pt presents with Acute on Chronic CHF, a Fall, and AMS.  pt with hx of HTN, DM, CKD, CAD, CHF, and Dementia.      PT Comments    Pt more alert and able to participate in treatment this date however pt remains to have bilat LE pain, weakness, and decreased activity tolerance. Pt did obtain standing with maxAx2 but could only tolerate 1 min with RW. Pt unable to lift feet to simulate ambulation. Per chart family would like to take patient home, if that is accurate pt will NEED a w/c, hoyer lift, BSC, hospital bed, and HHPT, in addition to, two person assist.   Follow Up Recommendations  SNF     Equipment Recommendations  Hospital bed;Wheelchair (measurements PT);Wheelchair cushion (measurements PT) (hoyer lift)    Recommendations for Other Services       Precautions / Restrictions Precautions Precautions: Fall Restrictions Weight Bearing Restrictions: No    Mobility  Bed Mobility Overal bed mobility: Needs Assistance Bed Mobility: Supine to Sit     Supine to sit: Max assist     General bed mobility comments: pt up in chair upon PT arrival  Transfers Overall transfer level: Needs assistance Equipment used:  (2 person lift with gait belf and bed pad) Transfers: Sit to/from Stand Sit to Stand: Max assist;+2 physical assistance Stand pivot transfers: Max assist       General transfer comment: took 2 trials and max encouragement to achieve full upright standing. pt only able to tolerate for 1 min. Attempted to march in place however pt unable to pick up feet in attempt to march in place, c/o LE pain  Ambulation/Gait             General Gait Details: pt unable   Stairs            Wheelchair Mobility    Modified Rankin (Stroke Patients Only)       Balance Overall balance assessment: Needs  assistance Sitting-balance support: Feet supported Sitting balance-Leahy Scale: Fair     Standing balance support: Bilateral upper extremity supported Standing balance-Leahy Scale: Zero Standing balance comment: once in standing with RW pt, min assist however demo'd decrseaed standing tolerance                    Cognition Arousal/Alertness: Awake/alert Behavior During Therapy: WFL for tasks assessed/performed Overall Cognitive Status: No family/caregiver present to determine baseline cognitive functioning                 General Comments: pt states she lives her daughter who pulls her up the stairs to get into the house but then both her son and daughter work so she would be home alone during the day    Exercises General Exercises - Lower Extremity Ankle Circles/Pumps: AROM;Both;10 reps Quad Sets: AROM;Both;5 reps Gluteal Sets: AROM;Both;5 reps Long Arc Quad: AROM;Both;10 reps;Seated Hip Flexion/Marching:  (unable)    General Comments        Pertinent Vitals/Pain Pain Assessment: 0-10 Pain Score: 8  Faces Pain Scale: Hurts even more Pain Location: knees Pain Descriptors / Indicators: Aching Pain Intervention(s): Monitored during session    Home Living Family/patient expects to be discharged to:: Private residence Living Arrangements: Children Available Help at Discharge: Family Type of Home: House Home Access: Stairs to enter  Prior Function            PT Goals (current goals can now be found in the care plan section) Progress towards PT goals: Progressing toward goals    Frequency  Min 2X/week    PT Plan Current plan remains appropriate    Co-evaluation             End of Session Equipment Utilized During Treatment: Gait belt;Oxygen Activity Tolerance: Patient limited by fatigue;Patient limited by lethargy Patient left: in chair;with call bell/phone within reach     Time: 1400-1418 PT Time Calculation (min) (ACUTE  ONLY): 18 min  Charges:  $Therapeutic Exercise: 8-22 mins                    G Codes:      Kingsley Callander 08/11/2015, 3:01 PM   Kittie Plater, PT, DPT Pager #: [phone] Office #: [phone]

## 2015-08-12 LAB — GLUCOSE, CAPILLARY
Glucose-Capillary: 167 mg/dL — ABNORMAL HIGH (ref 65–99)
Glucose-Capillary: 123 mg/dL — ABNORMAL HIGH (ref 65–99)
Glucose-Capillary: 113 mg/dL — ABNORMAL HIGH (ref 65–99)
Glucose-Capillary: 158 mg/dL — ABNORMAL HIGH (ref 65–99)

## 2015-08-12 LAB — RENAL FUNCTION PANEL
ALBUMIN: 2.4 g/dL — AB (ref 3.5–5.0)
ANION GAP: 11 (ref 5–15)
BUN: 80 mg/dL — AB (ref 6–20)
CALCIUM: 7.6 mg/dL — AB (ref 8.9–10.3)
CO2: 30 mmol/L (ref 22–32)
Chloride: 98 mmol/L — ABNORMAL LOW (ref 101–111)
Creatinine, Ser: 3.57 mg/dL — ABNORMAL HIGH (ref 0.44–1.00)
GFR calc Af Amer: 14 mL/min — ABNORMAL LOW (ref >60)
GFR calc non Af Amer: 12 mL/min — ABNORMAL LOW (ref >60)
GLUCOSE: 120 mg/dL — AB (ref 65–99)
PHOSPHORUS: 6.2 mg/dL — AB (ref 2.5–4.6)
Potassium: 4.9 mmol/L (ref 3.5–5.1)
SODIUM: 139 mmol/L (ref 135–145)

## 2015-08-12 MED ORDER — FUROSEMIDE 80 MG PO TABS
160.0000 mg | ORAL_TABLET | Freq: Two times a day (BID) | ORAL | Status: DC
  Administered 2015-08-12 – 2015-08-14 (×4): 160 mg via ORAL
  Filled 2015-08-12 (×4): qty 2

## 2015-08-12 MED ORDER — HYDROMORPHONE HCL 2 MG PO TABS
2.0000 mg | ORAL_TABLET | ORAL | Status: DC | PRN
  Administered 2015-08-12: 2 mg via ORAL
  Filled 2015-08-12: qty 1

## 2015-08-12 NOTE — Progress Notes (Signed)
   08/12/15 1200  Clinical Encounter Type  Visited With Patient;Patient and family together  Visit Type Initial (ADV Directive)  Referral From Palliative care team  Spiritual Encounters  Spiritual Needs Literature;Emotional  Advance Directives (For Healthcare)  Does patient have an advance directive? No  Would patient like information on creating an advanced directive? Yes - Scientist, clinical (histocompatibility and immunogenetics) given;Yes - Spiritual care consult ordered  Ch responded to consult and met pt and family; [place] explained Advanced Directive and gave materials to pt.  When completed, please contact [place] to arrange notary and witness. 12:58 PM Gwynn Burly

## 2015-08-12 NOTE — Progress Notes (Signed)
[facility] Rounding Note  Subjective:   Pt has indicated  that she would not undergo dialysis (her mother, Marney Setting, was on HD) Family met with palliative  - DNR/DNI/no dialysis She reiterates that decision to me and is comfortable with it. I support her decision  Has diuresed well past 24 hours with IV lasix Plan for change to po today No labs yet this AM Working with OT   Objective  Vital signs in last 24 hours: Filed Vitals:   08/11/15 2347 08/11/15 2355 08/12/15 0131 08/12/15 0420  BP:  150/59 170/71 155/58  Pulse:  80  77  Temp: 98.5 F (36.9 C)   97.9 F (36.6 C)  TempSrc: Oral   Oral  Resp:  '18 12 17  ' Height:      Weight:      SpO2:  97% 98% 98%   Weight change:   Intake/Output Summary (Last 24 hours) at 08/12/15 0842 Last data filed at 08/12/15 0600  Gross per 24 hour  Intake    626 ml  Output   2850 ml  Net  -2224 ml    Physical Exam: BP 155/58 mmHg  Pulse 77  Temp(Src) 97.9 F (36.6 C) (Oral)  Resp 17  Ht '5\' 3"'  (1.6 m)  Wt 93.5 kg (206 lb 2.1 oz)  BMI 36.52 kg/m2  SpO2 98%  OOB working with OT Awake, alert, best she has looked all week!!! Crackles bilaterally just at bases - improved Regular S1S2 No S3S4 1/6 murmur LSB Abd obese soft ntnd  LE's/ UE's 1-2+ pitting edema bilat SCD's in place  Labs: No labs yet today   Recent Labs Lab 08/06/15 0300 08/06/15 0940 08/07/15 0505 08/08/15 0324 08/09/15 0216 08/10/15 0234 08/11/15 0227  NA 149* 148* 147* 143 139 139 139  K 5.3* 5.2* 4.8 5.0 5.4* 5.7* 5.1  CL 112* 113* 110 107 102 103 101  CO2 '25 24 26 27 27 26 28  ' GLUCOSE 109* 76 106* 178* 143* 173* 185*  BUN 58* 60* 57* 64* 70* 73* 77*  CREATININE 3.80* 3.82* 3.60* 3.73* 3.81* 3.73* 3.71*  CALCIUM 6.9* 7.0* 7.1* 6.9* 7.2* 7.3* 7.6*  PHOS  --   --   --   --   --   --  7.0*     Recent Labs Lab 08/08/15 0324 08/09/15 0216 08/10/15 0234 08/11/15 0227  AST 12* 14* 12*  --   ALT 10* 10* 10*  --   ALKPHOS  72 80 75  --   BILITOT 0.4 0.5 0.5  --   PROT 5.1* 5.6* 5.3*  --   ALBUMIN 2.1* 2.1* 2.1* 2.1*     Recent Labs Lab 08/05/15 1603  AMMONIA 15     Recent Labs Lab 08/07/15 0505 08/08/15 0324 08/09/15 0216 08/10/15 0234 08/11/15 0227  WBC 4.3 5.5 5.9 6.2 4.9  NEUTROABS 2.6 3.4 3.7 4.5  --   HGB 9.2* 9.0* 9.8* 9.1* 9.2*  HCT 31.8* 31.7* 34.2* 31.8* 32.0*  MCV 88.1 88.8 89.5 89.3 89.1  PLT 260 209 217 227 245     Recent Labs Lab 08/05/15 2200 08/06/15 0300  TROPONINI 0.10* 0.10*     Recent Labs Lab 08/10/15 2141 08/11/15 0733 08/11/15 1133 08/11/15 1636 08/11/15 2115  GLUCAP 192* 127* 100* 138* 167*   UA - Protein >300 0-5 rbc, 0-5 wbc, 0-5 epi CXR 12/29 vasc congestion  Medications:   . antiseptic oral rinse  7 mL Mouth Rinse BID  . aspirin  325 mg Oral Daily  . atorvastatin  20 mg Oral q1800  . brimonidine  1 drop Right Eye Q12H   And  . timolol  1 drop Right Eye Q12H  . carvedilol  12.5 mg Oral BID WC  . feeding supplement (NEPRO CARB STEADY)  237 mL Oral BID BM  . furosemide  160 mg Oral BID  . heparin  5,000 Units Subcutaneous 3 times per day  . hydrALAZINE  50 mg Oral 3 times per day  . insulin aspart  0-20 Units Subcutaneous TID WC  . insulin aspart  0-5 Units Subcutaneous QHS  . insulin detemir  7 Units Subcutaneous QHS  . isosorbide mononitrate  30 mg Oral Daily    Background:  70 y.o. year-old with hx of HTN, DM2, CAD, CHF and CKD who presented on 12/28 with confusion, SOB, +orthopnea, sig LE edema on admission. Admitted w dx of a/c diast HF, hypercapnic resp failure and started on IV lasix. ECHO 12/29 LVEF 40-45%.  Known (progressive) CKD with most recent baseline creatinine prior to admission of 2.52 04/2015 (GFR 21 Stage 4) and on admission 12/28 was 3.8 (GFR 13). Had never seen Nephrology as outpt. We were asked to see. She has decided AGAINST any form of renal replacement therapy - either short or long term. .   Assessment/Recommendations  1. AKI on CKD4 - renal function has not improved since admission (though has not worsened either).  GFR around 13. No labs back yet today. She has indicated that she WOULD NOT undergo dialysis treatments. Understands that if no dialysis when the time comes, then comfort measures only option. Palliative Care has seen and their note reviewed.   2. Volume overload/edema - good diuresis past 24 hours with 2.8 liters of UOP. Change from IV to po lasix 160 BID.  3. Hyperkalemia - required Kayexalate 1/2. K better yesterday. Pending form this AM. 4. Anemia -TSat low at 11%. Given plans and goals of care will not embark on expensive IV fe replacement just now 5. Acute systolic and diastolic CHF 6. DM 7. HTN 8. Hypercapnic resp failure - improved some  9. LLE calf wound. WOC seeing. Partial thickness skin loss described.   Jamal Maes, MD [facility] [phone] Pager 08/12/2015, 8:42 AM

## 2015-08-12 NOTE — Progress Notes (Signed)
Transfer note:  Arrival Method:  Chair from [facility] Mental Orientation:A&OX4 Telemetry: N/A Assessment: See  flowsheet Skin: Dry; L  Leg cellulitis IV: R forearm Pain: Denies Tubes: Foleycath Safety Measures: Yellow socks placed, call light and all belongings within reach  6700 Orientation: Patient has been oriented to the unit, staff and to the room.  Orders have been reviewed and implemented. Will continue to monitor pt.   Gavin Potters

## 2015-08-12 NOTE — Progress Notes (Signed)
Daily Progress Note   Patient Name: Kathleen [name]       Date: 08/12/2015 DOB: 1946-03-23  Age: 70 y.o. MRN#: [id number] Attending Physician: Cherene Altes, MD Primary Care Physician: Benito Mccreedy, MD Admit Date: 08/05/2015  Reason for Consultation/Follow-up: Establishing goals of care, Non pain symptom management and Pain control  Subjective:  Feeling better.  C/o leg cramps and sore legs, but overall feeling much better.  Wants to get up and move.   Interval Events: Family meeting held 08/11/15 with 2 sons, dtr, ex husband and patient.  Meeting resulted in an affirmation of DNR / DNI, No hemodialysis, and an understanding that if her kidneys decline and are not responsive to medical management in the future, comfort care would be appropriate.  08/12/15 Patient appears much more alert and talkative today.  Lasix infusion has been transitioned to oral lasix.  She has diuresed well in the last 24 hours.  I saw the patient with Dr. Domingo Cocking.  Patient expressed that time with family, being at home, and a natural death are what is most important to her.  The patient indicated that she wants her son Kathleen [name] to be formally designated as her 70.  Dr. Domingo Cocking discussed filling out a MOST form with the patient in order to help direct her care according to her wishes during future hospitalizations.  The patient expressed that she would like to fill out the MOST form and did not need to have other family members present to do so.  Patient seldom using PRN fentanyl despite having pain.  Able to take POs.  Made patient aware of PRN pain medication that may actually allow her to increase functionality.  Changed from IV fentanyl to PO low dose dilaudid.  PM will plan to round on 08/13/15 and assist the  patient with a MOST form.   [place], Sabreen Kitchen L, PA-C     Length of Stay: 7 days  Current Medications: Scheduled Meds:  . antiseptic oral rinse  7 mL Mouth Rinse BID  . aspirin  325 mg Oral Daily  . atorvastatin  20 mg Oral q1800  . brimonidine  1 drop Right Eye Q12H   And  . timolol  1 drop Right Eye Q12H  . carvedilol  12.5 mg Oral BID WC  . feeding supplement (NEPRO CARB  STEADY)  237 mL Oral BID BM  . furosemide  160 mg Oral BID  . heparin  5,000 Units Subcutaneous 3 times per day  . hydrALAZINE  50 mg Oral 3 times per day  . insulin aspart  0-20 Units Subcutaneous TID WC  . insulin aspart  0-5 Units Subcutaneous QHS  . insulin detemir  7 Units Subcutaneous QHS  . isosorbide mononitrate  30 mg Oral Daily    Continuous Infusions:    PRN Meds: acetaminophen, HYDROmorphone, meclizine, ondansetron (ZOFRAN) IV  Physical Exam: Physical Exam  Constitutional: She is oriented to person, place, and time. She appears well-developed.  HENT:  Head: Normocephalic and atraumatic.  Eyes: Pupils are equal, round, and reactive to light.  Cardiovascular: Tachycardia present.   Pulmonary/Chest: No respiratory distress. She has no wheezes.  Abdominal: She exhibits no distension. There is no tenderness.  Neurological: She is alert and oriented to person, place, and time.  Skin: Skin is warm and dry.  Psychiatric: She has a normal mood and affect. Her behavior is normal. Judgment and thought content normal.    Vital Signs: BP 142/80 mmHg  Pulse 69  Temp(Src) 97.8 F (36.6 C) (Oral)  Resp 17  Ht 5\' 3"  (1.6 m)  Wt 93.5 kg (206 lb 2.1 oz)  BMI 36.52 kg/m2  SpO2 98% SpO2: SpO2: 98 % O2 Device: O2 Device: Nasal Cannula O2 Flow Rate: O2 Flow Rate (L/min): 2 L/min  Intake/output summary:   Intake/Output Summary (Last 24 hours) at 08/12/15 1241 Last data filed at 08/12/15 0600  Gross per 24 hour  Intake    546 ml  Output   2850 ml  Net  -2304 ml   LBM: Last BM Date:  08/10/15 Baseline Weight: Weight: 93 kg (205 lb 0.4 oz) Most recent weight: Weight: 93.5 kg (206 lb 2.1 oz)       Palliative Assessment/Data: Flowsheet Rows        Most Recent Value   Intake Tab    Clinical Assessment    Palliative Performance Scale Score  30%   Pain Max last 24 hours  4   Pain Min Last 24 hours  1   Dyspnea Max Last 24 Hours  3   Dyspnea Min Last 24 hours  1   Psychosocial & Spiritual Assessment    Palliative Care Outcomes    Patient/Family meeting held?  Yes   Who was at the meeting?  ex husband, two sons, daughter , patient   Palliative Care Outcomes  Improved pain interventions, Other (Comment)   Patient/Family wishes: Interventions discontinued/not started   Mechanical Ventilation, Hemodialysis   Palliative Care follow-up planned  Yes, Facility      Additional Data Reviewed: CBC    Component Value Date/Time   WBC 4.9 08/11/2015 0227   RBC 3.59* 08/11/2015 0227   HGB 9.2* 08/11/2015 0227   HCT 32.0* 08/11/2015 0227   PLT 245 08/11/2015 0227   MCV 89.1 08/11/2015 0227   MCH 25.6* 08/11/2015 0227   MCHC 28.8* 08/11/2015 0227   RDW 15.9* 08/11/2015 0227   LYMPHSABS 0.8 08/10/2015 0234   MONOABS 0.6 08/10/2015 0234   EOSABS 0.3 08/10/2015 0234   BASOSABS 0.0 08/10/2015 0234    CMP     Component Value Date/Time   NA 139 08/11/2015 0227   K 5.1 08/11/2015 0227   CL 101 08/11/2015 0227   CO2 28 08/11/2015 0227   GLUCOSE 185* 08/11/2015 0227   BUN 77* 08/11/2015 0227   CREATININE  3.71* 08/11/2015 0227   CALCIUM 7.6* 08/11/2015 0227   PROT 5.3* 08/10/2015 0234   ALBUMIN 2.1* 08/11/2015 0227   AST 12* 08/10/2015 0234   ALT 10* 08/10/2015 0234   ALKPHOS 75 08/10/2015 0234   BILITOT 0.5 08/10/2015 0234   GFRNONAA 11* 08/11/2015 0227   GFRAA 13* 08/11/2015 0227       Problem List:  Patient Active Problem List   Diagnosis Date Noted  . Dyspnea   . Goals of care, counseling/discussion   . SOB (shortness of breath)   . Encounter for  palliative care   . Pulmonary vascular congestion   . Pleural effusion, left   . Acute on chronic renal failure ([place])   . HLD (hyperlipidemia)   . Noncompliance with medication regimen   . Disorientation   . Somnolence   . Acute respiratory failure with hypercapnia ([place])   . Respiratory acidosis   . Hypernatremia   . Systolic and diastolic CHF, acute ([place])   . CHF, acute on chronic ([place]) 08/05/2015  . Acute renal failure superimposed on stage 3 chronic kidney disease ([place]) 08/05/2015  . Elevated troponin 08/05/2015  . CHF (congestive heart failure) ([place]) 08/05/2015  . Acute on chronic heart failure ([place]) 08/05/2015  . Acute on chronic combined systolic and diastolic congestive heart failure ([place])   . AKI (acute kidney injury) ([place])   . Left carotid bruit 11/20/2014  . Syncope and collapse 06/10/2014  . Syncope 06/10/2014  . Essential hypertension   . Unspecified vitamin D deficiency   . Cellulitis and abscess of right lower extremitiy 02/28/2014  . Chronic combined systolic and diastolic heart failure ([place]) 02/12/2014  . CAD (coronary artery disease)   . Obesity   . Meningioma ([place])   . Anemia   . CKD stage 3 due to type 2 diabetes mellitus ([place]) 01/24/2014  . Hyperlipidemia 01/22/2014  . BPPV (benign paroxysmal positional vertigo) 01/22/2014  . Tobacco abuse 01/21/2014  . Diabetes mellitus due to abnormal insulin ([place]) 01/21/2014     Palliative Care Assessment & Plan    1.Code Status:  DNR    Code Status Orders        Start     Ordered   08/11/15 0922  Do not attempt resuscitation (DNR)   Continuous    Question Answer Comment  In the event of cardiac or respiratory ARREST Do not call a "code blue"   In the event of cardiac or respiratory ARREST Do not perform Intubation, CPR, defibrillation or ACLS   In the event of cardiac or respiratory ARREST Use medication by any route, position, wound care, and other measures to relive pain and suffering. May use oxygen,  suction and manual treatment of airway obstruction as needed for comfort.   Comments NO Dialysis      08/11/15 0923       2. Goals of Care/Additional Recommendations:  DNR / DNI  No hemodialysis.  Limitations on Scope of Treatment: Full scope of medical treatment  Desire for further Chaplaincy support: yes,  Chaplain will assist with formal paperwork to designate son Kathleen [name] as HCPOA  Psycho-social Needs: Caregiving  Support/Resources    3. Symptom Management:    Discontinue Fentanyl.  Initial low dose PO dilaudid PRN leg/back pain.  4. Palliative Prophylaxis:   Bowel Regimen, Frequent Pain Assessment and Turn Reposition  5. Prognosis: < 12 months  Difficult to determine at this point.    6. Discharge Planning:  [place] for rehab with Palliative  care service follow-up vs home with home health services.   Care plan was discussed with patient at bedside.  Thank you for allowing the Palliative Medicine Team to assist in the care of this patient.   Time In: 11:15 Time Out: 11:50 Total Time 35 min Prolonged Time Billed no        Melton Alar, PA-C  08/12/2015, 12:41 PM  Please contact Palliative Medicine Team phone at [phone] for questions and concerns.

## 2015-08-12 NOTE — Progress Notes (Signed)
Occupational Therapy Treatment Patient Details Name: SHANNIKA WINKLER MRN: [id number] DOB: [birth year] Today's Date: 08/12/2015    History of present illness pt presents with Acute on Chronic CHF, a Fall, and AMS.  pt with hx of HTN, DM, CKD, CAD, CHF, and Dementia.     OT comments  This pt is making progress since eval earlier this week. She is more coherent and follow directions and thus with increased ability to A physically. Pain in Bil hips and left knee are limitations. She will continue to benefit from acute OT with follow up [place] to work on increasing function and decreasing burden of care.  Follow Up Recommendations  Home health OT;Other (comment);Supervision/Assistance - 24 hour (HHOT, HHPT, HHRN, HHAide)    Equipment Recommendations  3 in 1 bedside comode;Wheelchair (measurements OT);Hospital bed;Other (comment) (hoyer lift)       Precautions / Restrictions Precautions Precautions: Fall Restrictions Weight Bearing Restrictions: No       Mobility Bed Mobility               General bed mobility comments: pt up in chair upon OT arrival  Transfers Overall transfer level: Needs assistance   Transfers: Sit to/from Stand Sit to Stand: Max assist;+2 physical assistance         General transfer comment: Once in standing with Stedy (+2 max A) pt needed minimal A to shift hips left and right to get seat of Stedy under her        ADL Overall ADL's : Needs assistance/impaired                         Toilet Transfer: +2 for physical assistance;Maximal assistance (with use of Stedy)   Toileting- Clothing Manipulation and Hygiene: Total assistance (with use of Stedy and +2 max A sit<>stand)                          Cognition   Behavior During Therapy: WFL for tasks assessed/performed Overall Cognitive Status: Impaired/Different from baseline Area of Impairment: Problem solving;Attention   Current Attention Level: Selective           Problem Solving: Difficulty sequencing;Requires verbal cues;Requires tactile cues        Exercises Other Exercises Other Exercises: Worked on standing from seat of Stedy for 30 second intervals x3           Pertinent Vitals/ Pain       Pain Assessment: Faces Faces Pain Scale: Hurts even more Pain Location: Bil hips and left knee Pain Descriptors / Indicators: Aching;Sore Pain Intervention(s): Monitored during session;Repositioned         Frequency Min 2X/week     Progress Toward Goals  OT Goals(current goals can now be found in the care plan section)  Progress towards OT goals: Progressing toward goals     Plan Discharge plan remains appropriate       End of Session Equipment Utilized During Treatment: Gait belt Charlaine [place])   Activity Tolerance Patient limited by fatigue   Patient Left in chair;with call bell/phone within reach (no alarm due to no alarm under her when we received her in recliner upon entering room)   Nurse Communication          Time: [id number] OT Time Calculation (min): 28 min  Charges: OT General Charges $OT Visit: 1 Procedure OT Treatments $Self Care/Home Management : 23-37 mins  Almon Register [id number] 08/12/2015, 10:30 AM

## 2015-08-12 NOTE — Progress Notes (Signed)
[facility] TEAM 1 - Stepdown/ICU TEAM PROGRESS NOTE  Kathleen [name] [id number] DOB: [birth year] DOA: 08/05/2015 PCP: Benito Mccreedy, MD  Admit HPI / Brief Narrative: 70 yo F Hx HTN, DM2, HLD, CKD, CAD, and Chronic Combined CHF (01/2014 EF 25-30% by echo) who presented to the ED c/o gradual worsening shortness of breath, increased lower extremity edema, and worsening orthopnea.   In the emergency department she was afebrile and hemodynamically stable w/ oxygen sat 90% on room air.   HPI/Subjective: The patient is resting comfortably in a bedside chair.  She states her breathing is improved.  She denies chest pain fevers chills nausea or vomiting.  Assessment/Plan:  Toxic metabolic encephalopathy - resolved  -felt to be due to hypercapnia - pt is now alert and conversant - back to her baseline mental statsus    Acute Respiratory Failure w/ Hypercapnia Respiratory Acidosis -off BIPAP - resp status appears to have stabilized - attempt to wean from O2  Acute on CKD Stage 4 (Baseline ~2.5) -patient's renal failure has worsened over the last year  -pt does not desire HD, even if it means changing to comfort care only -Nephrology continues to follow and is directing diuretic regimen - transitioning to oral lasix today  Hyperkalemia -corrected w/ kayexalate   Acute exacerbation of chronic combined Systolic and Diastolic CHF -EF [id number] w/ grade I DD per TTE this admit  -admit weight 93 kg- presently 93.5kg  -diuresis per Nephrology  Pulmonary HTN  Normocytic Anemia  -due to CKD - no plans for IV Fe given goals of care   DM -CBG reasonably well controlled at this time   Hypernatremia -resolved   HLD -Continue Lipitor 20 mg daily  LLE calf wound -as per WOC - appears to be ruptured old blister  Code Status: DNR Family Communication: no family present at time of exam Disposition Plan: transfer to medical bed - cont diuresis - determine home diuretic regimen - cont  PT/OT - arrange for home w/ [facility] - attempt to wean from O2 and check sat w/ ambulation - possible d/c in next 48-72hrs   Consultants: Nephrology [facility]  Palliative Care  Procedures: 12/29 TTE - LV severeconcentric hypertrophy - EF A999333 - grade 1diastolic dysfunction - Tricuspid valve: moderate regurgitation - PA peak pressure: 45 mm Hg  Antibiotics: none  DVT prophylaxis: SQ heparin   Objective: Blood pressure 142/80, pulse 69, temperature 97.8 F (36.6 C), temperature source Oral, resp. rate 17, height 5\' 3"  (1.6 m), weight 93.5 kg (206 lb 2.1 oz), SpO2 98 %.  Intake/Output Summary (Last 24 hours) at 08/12/15 1337 Last data filed at 08/12/15 0600  Gross per 24 hour  Intake    426 ml  Output   2850 ml  Net  -2424 ml    Exam: General: No acute respiratory distress at rest  Lungs: CTA th/o - no crackles or wheeze  Cardiovascular: Regular rate and rhythm without M or rub Abdomen: Nontender, protuberant, soft, bowel sounds positive, no rebound, no ascites, no appreciable mass Extremities: No significant cyanosis, or clubbing; trace edema bilateral lower extremities  Data Reviewed:  Basic Metabolic Panel:  Recent Labs Lab 08/06/15 0940 08/07/15 0505 08/08/15 0324 08/09/15 0216 08/10/15 0234 08/11/15 0227 08/12/15 1220  NA 148* 147* 143 139 139 139 139  K 5.2* 4.8 5.0 5.4* 5.7* 5.1 4.9  CL 113* 110 107 102 103 101 98*  CO2 24 26 27 27 26 28 30   GLUCOSE 76 106* 178* 143* 173* 185* 120*  BUN 60* 57* 64* 70* 73* 77* 80*  CREATININE 3.82* 3.60* 3.73* 3.81* 3.73* 3.71* 3.57*  CALCIUM 7.0* 7.1* 6.9* 7.2* 7.3* 7.6* 7.6*  MG 1.6* 1.6* 2.1 2.0 2.0  --   --   PHOS  --   --   --   --   --  7.0* 6.2*    CBC:  Recent Labs Lab 08/06/15 1140 08/07/15 0505 08/08/15 0324 08/09/15 0216 08/10/15 0234 08/11/15 0227  WBC 4.3 4.3 5.5 5.9 6.2 4.9  NEUTROABS 2.7 2.6 3.4 3.7 4.5  --   HGB 9.7* 9.2* 9.0* 9.8* 9.1* 9.2*  HCT 33.9* 31.8* 31.7* 34.2* 31.8* 32.0*  MCV  91.6 88.1 88.8 89.5 89.3 89.1  PLT 221 260 209 217 227 245    Liver Function Tests:  Recent Labs Lab 08/06/15 0940 08/07/15 0505 08/08/15 0324 08/09/15 0216 08/10/15 0234 08/11/15 0227 08/12/15 1220  AST 17 12* 12* 14* 12*  --   --   ALT 14 11* 10* 10* 10*  --   --   ALKPHOS 82 75 72 80 75  --   --   BILITOT 0.4 0.7 0.4 0.5 0.5  --   --   PROT 5.1* 5.2* 5.1* 5.6* 5.3*  --   --   ALBUMIN 2.3* 2.1* 2.1* 2.1* 2.1* 2.1* 2.4*    Recent Labs Lab 08/05/15 1603  AMMONIA 15    Cardiac Enzymes:  Recent Labs Lab 08/05/15 2200 08/06/15 0300  TROPONINI 0.10* 0.10*    CBG:  Recent Labs Lab 08/11/15 1133 08/11/15 1636 08/11/15 2115 08/12/15 0920 08/12/15 1211  GLUCAP 100* 138* 167* 123* 113*    Recent Results (from the past 240 hour(s))  MRSA PCR Screening     Status: None   Collection Time: 08/05/15  6:53 PM  Result Value Ref Range Status   MRSA by PCR NEGATIVE NEGATIVE Final    Comment:        The GeneXpert MRSA Assay (FDA approved for NASAL specimens only), is one component of a comprehensive MRSA colonization surveillance program. It is not intended to diagnose MRSA infection nor to guide or monitor treatment for MRSA infections.      Studies:   Recent x-ray studies have been reviewed in detail by the Attending Physician  Scheduled Meds:  Scheduled Meds: . antiseptic oral rinse  7 mL Mouth Rinse BID  . aspirin  325 mg Oral Daily  . atorvastatin  20 mg Oral q1800  . brimonidine  1 drop Right Eye Q12H   And  . timolol  1 drop Right Eye Q12H  . carvedilol  12.5 mg Oral BID WC  . feeding supplement (NEPRO CARB STEADY)  237 mL Oral BID BM  . furosemide  160 mg Oral BID  . heparin  5,000 Units Subcutaneous 3 times per day  . hydrALAZINE  50 mg Oral 3 times per day  . insulin aspart  0-20 Units Subcutaneous TID WC  . insulin aspart  0-5 Units Subcutaneous QHS  . insulin detemir  7 Units Subcutaneous QHS  . isosorbide mononitrate  30 mg Oral Daily     Time spent on care of this patient: 35 mins   MCCLUNG,JEFFREY T , MD   [facility] Office  [phone] Pager - Text Page per Shea Evans as per below:  On-Call/Text Page:      [email or web address]      password [email or web address]  If 7PM-7AM, please contact night-coverage [email or web address] Password [email or web address] 08/12/2015, 1:37 PM   LOS: 7 days

## 2015-08-13 DIAGNOSIS — N179 Acute kidney failure, unspecified: Secondary | ICD-10-CM

## 2015-08-13 DIAGNOSIS — N184 Chronic kidney disease, stage 4 (severe): Secondary | ICD-10-CM

## 2015-08-13 LAB — CBC
HEMATOCRIT: 28.7 % — AB (ref 36.0–46.0)
HEMOGLOBIN: 8.5 g/dL — AB (ref 12.0–15.0)
MCH: 25.9 pg — AB (ref 26.0–34.0)
MCHC: 29.6 g/dL — AB (ref 30.0–36.0)
MCV: 87.5 fL (ref 78.0–100.0)
Platelets: 230 10*3/uL (ref 150–400)
RBC: 3.28 MIL/uL — ABNORMAL LOW (ref 3.87–5.11)
RDW: 15.6 % — ABNORMAL HIGH (ref 11.5–15.5)
WBC: 3.9 10*3/uL — ABNORMAL LOW (ref 4.0–10.5)

## 2015-08-13 LAB — RENAL FUNCTION PANEL
ANION GAP: 12 (ref 5–15)
Albumin: 2.1 g/dL — ABNORMAL LOW (ref 3.5–5.0)
BUN: 82 mg/dL — ABNORMAL HIGH (ref 6–20)
CO2: 31 mmol/L (ref 22–32)
Calcium: 7.4 mg/dL — ABNORMAL LOW (ref 8.9–10.3)
Chloride: 97 mmol/L — ABNORMAL LOW (ref 101–111)
Creatinine, Ser: 3.3 mg/dL — ABNORMAL HIGH (ref 0.44–1.00)
GFR calc Af Amer: 15 mL/min — ABNORMAL LOW (ref >60)
GFR calc non Af Amer: 13 mL/min — ABNORMAL LOW (ref >60)
GLUCOSE: 118 mg/dL — AB (ref 65–99)
POTASSIUM: 4.4 mmol/L (ref 3.5–5.1)
Phosphorus: 5.9 mg/dL — ABNORMAL HIGH (ref 2.5–4.6)
SODIUM: 140 mmol/L (ref 135–145)

## 2015-08-13 LAB — GLUCOSE, CAPILLARY
GLUCOSE-CAPILLARY: 171 mg/dL — AB (ref 65–99)
Glucose-Capillary: 107 mg/dL — ABNORMAL HIGH (ref 65–99)
Glucose-Capillary: 118 mg/dL — ABNORMAL HIGH (ref 65–99)
Glucose-Capillary: 215 mg/dL — ABNORMAL HIGH (ref 65–99)

## 2015-08-13 MED ORDER — SODIUM CHLORIDE 0.9 % IV SOLN
510.0000 mg | INTRAVENOUS | Status: DC
  Administered 2015-08-13: 510 mg via INTRAVENOUS
  Filled 2015-08-13: qty 17

## 2015-08-13 NOTE — Progress Notes (Signed)
Physical Therapy Treatment Patient Details Name: Kathleen [name] MRN: [id number] DOB: [birth year] Today's Date: 08/13/2015    History of Present Illness pt presents with Acute on Chronic CHF, a Fall, and AMS.  pt with hx of HTN, DM, CKD, CAD, CHF, and Dementia.      PT Comments    Making progress towards functional goals. Pt eager to attempt to ambulate today. Performed transfer and gait training up to 15 feet with min-mod assist +2. Very antalgic, complaining of Lt knee pain > Rt which is apparently chronic. Tolerated gentle ROM exercises well. Patient will continue to benefit from skilled physical therapy services to further improve independence with functional mobility.   Follow Up Recommendations  SNF - If family decides to take pt home then [facility] bed;Wheelchair (measurements PT);Wheelchair cushion (measurements PT) (hoyer lif - If family decides to take pt home)  Otherwise, next venue of care to select equipment use.   Recommendations for Other Services       Precautions / Restrictions Precautions Precautions: Fall Restrictions Weight Bearing Restrictions: No    Mobility  Bed Mobility Overal bed mobility: Needs Assistance Bed Mobility: Sit to Supine       Sit to supine: +2 for physical assistance   General bed mobility comments: Min assist +2 for truncal and LEs back into bed. VC for technique.  Transfers Overall transfer level: Needs assistance Equipment used: Rolling walker (2 wheeled) Transfers: Sit to/from Stand Sit to Stand: +2 physical assistance;Mod assist         General transfer comment: Mod assist for boost to stand from reclining chair. VC for hand placement and anterior weight shift for momentum.   Ambulation/Gait Ambulation/Gait assistance: Min assist;+2 safety/equipment Ambulation Distance (Feet): 15 Feet Assistive device: Rolling walker (2 wheeled) Gait Pattern/deviations: Step-to pattern;Decreased step  length - right;Decreased stance time - left;Decreased stride length;Antalgic Gait velocity: slow Gait velocity interpretation: <1.8 ft/sec, indicative of risk for recurrent falls General Gait Details: Educated on safe DME use with a rolling walker. Lt knee with mild instability but no overt buckling. Very antalgic gait, slow and guarded. Min assist for walker advancement at times, with chair follow for safety. Better mechanics as distance increased.   Stairs            Wheelchair Mobility    Modified Rankin (Stroke Patients Only)       Balance           Standing balance support: Bilateral upper extremity supported Standing balance-Leahy Scale: Poor                      Cognition Arousal/Alertness: Awake/alert Behavior During Therapy: WFL for tasks assessed/performed Overall Cognitive Status: Within Functional Limits for tasks assessed                      Exercises General Exercises - Lower Extremity Ankle Circles/Pumps: AROM;Both;10 reps Heel Slides: AAROM;Both;10 reps;Supine Hip ABduction/ADduction: AAROM;Both;10 reps;Supine    General Comments General comments (skin integrity, edema, etc.): SpO2 88-90% on room air. Up to 93% on 1L supplemental O2 while working with therapy      Pertinent Vitals/Pain Pain Assessment: Faces Faces Pain Scale: Hurts even more Pain Location: Rt hip, Lt knee Pain Descriptors / Indicators: Aching;Grimacing;Guarding Pain Intervention(s): Monitored during session;Repositioned;Limited activity within patient's tolerance    Home Living  Prior Function            PT Goals (current goals can now be found in the care plan section) Acute Rehab PT Goals Patient Stated Goal: Be as independent as possible Time For Goal Achievement: 08/23/15 Potential to Achieve Goals: Good Progress towards PT goals: Progressing toward goals    Frequency  Min 2X/week    PT Plan Current plan remains  appropriate    Co-evaluation             End of Session Equipment Utilized During Treatment: Gait belt;Oxygen Activity Tolerance: Patient tolerated treatment well Patient left: with call bell/phone within reach;in bed;with bed alarm set     Time: 1411-1430 PT Time Calculation (min) (ACUTE ONLY): 19 min  Charges:  $Therapeutic Activity: 8-22 mins                    G Codes:      Ellouise Newer Aug 28, 2015, 3:40 PM  Camille Bal [place], [place]

## 2015-08-13 NOTE — Clinical Social Work Note (Signed)
Received consult for SNF placement. Assessment completed (full assessment to follow) and patient information transmitted to facilities in [facility].   Rajesh Wyss Givens, MSW, LCSW Licensed Clinical Social Worker [place] [phone]

## 2015-08-13 NOTE — Progress Notes (Signed)
Daily Progress Note   Patient Name: Kathleen [name]       Date: 08/13/2015 DOB: [birth year]  Age: 70 y.o. MRN#: [id number] Attending Physician: Orson Eva, MD Primary Care Physician: Benito Mccreedy, MD Admit Date: 08/05/2015  Reason for Consultation/Follow-up: Establishing goals of care, Non pain symptom management and Pain control  Subjective:   Kathleen [name] reports feeling better today other than being tired. She states is been a busy few days and she feels that she is just worn down. She reports that her pain was well controlled last night with oral Dilaudid.  Her son, [name], was present in room for conversation. We discussed care plan moving forward and also completed a MOST form. See details under goals of care below.  Length of Stay: 8 days  Current Medications: Scheduled Meds:  . antiseptic oral rinse  7 mL Mouth Rinse BID  . aspirin  325 mg Oral Daily  . atorvastatin  20 mg Oral q1800  . brimonidine  1 drop Right Eye Q12H   And  . timolol  1 drop Right Eye Q12H  . carvedilol  12.5 mg Oral BID WC  . feeding supplement (NEPRO CARB STEADY)  237 mL Oral BID BM  . ferumoxytol  510 mg Intravenous Weekly  . furosemide  160 mg Oral BID  . heparin  5,000 Units Subcutaneous 3 times per day  . hydrALAZINE  50 mg Oral 3 times per day  . insulin aspart  0-20 Units Subcutaneous TID WC  . insulin aspart  0-5 Units Subcutaneous QHS  . insulin detemir  7 Units Subcutaneous QHS  . isosorbide mononitrate  30 mg Oral Daily    Continuous Infusions:    PRN Meds: acetaminophen, HYDROmorphone, meclizine, ondansetron (ZOFRAN) IV  Physical Exam: Physical Exam  Constitutional: She is oriented to person, place, and time. She appears well-developed.  HENT:  Head: Normocephalic and  atraumatic.   Cardiovascular: Regular. No murmur.   Pulmonary/Chest: No respiratory distress. She has no wheezes.  Abdominal: She exhibits no distension. There is no tenderness.  Neurological: She is alert and oriented to person, place, and time.  Skin: Skin is warm and dry.  Psychiatric: She has a normal mood and affect. Her behavior is normal. Judgment and thought content normal.    Vital Signs: BP 145/76 mmHg  Pulse  80  Temp(Src) 98.4 F (36.9 C) (Oral)  Resp 18  Ht '5\' 3"'  (1.6 m)  Wt 93.5 kg (206 lb 2.1 oz)  BMI 36.52 kg/m2  SpO2 90% SpO2: SpO2: 90 % O2 Device: O2 Device: Not Delivered O2 Flow Rate: O2 Flow Rate (L/min): 2 L/min  Intake/output summary:   Intake/Output Summary (Last 24 hours) at 08/13/15 1614 Last data filed at 08/13/15 1500  Gross per 24 hour  Intake    817 ml  Output   2550 ml  Net  -1733 ml   LBM: Last BM Date: 08/10/15 Baseline Weight: Weight: 93 kg (205 lb 0.4 oz) Most recent weight: Weight: 93.5 kg (206 lb 2.1 oz)       Palliative Assessment/Data: Flowsheet Rows        Most Recent Value   Intake Tab    Referral Department  Nephrology   Unit at Time of Referral  Med/Surg Unit   Palliative Care Primary Diagnosis  Nephrology   Date Notified  08/09/15   Palliative Care Type  New Palliative care   Reason for referral  Clarify Goals of Care   Date of Admission  08/05/15   Date first seen by Palliative Care  08/10/15   # of days Palliative referral response time  1 Day(s)   # of days IP prior to Palliative referral  4   Clinical Assessment    Palliative Performance Scale Score  30%   Pain Max last 24 hours  4   Pain Min Last 24 hours  1   Dyspnea Max Last 24 Hours  3   Dyspnea Min Last 24 hours  1   Psychosocial & Spiritual Assessment    Palliative Care Outcomes    Patient/Family meeting held?  Yes   Who was at the meeting?  ex husband, two sons, daughter , patient   Palliative Care Outcomes  Improved pain interventions, Other (Comment)    Patient/Family wishes: Interventions discontinued/not started   Mechanical Ventilation, Hemodialysis   Palliative Care follow-up planned  Yes, Facility      Additional Data Reviewed: CBC    Component Value Date/Time   WBC 3.9* 08/13/2015 0428   RBC 3.28* 08/13/2015 0428   HGB 8.5* 08/13/2015 0428   HCT 28.7* 08/13/2015 0428   PLT 230 08/13/2015 0428   MCV 87.5 08/13/2015 0428   MCH 25.9* 08/13/2015 0428   MCHC 29.6* 08/13/2015 0428   RDW 15.6* 08/13/2015 0428   LYMPHSABS 0.8 08/10/2015 0234   MONOABS 0.6 08/10/2015 0234   EOSABS 0.3 08/10/2015 0234   BASOSABS 0.0 08/10/2015 0234    CMP     Component Value Date/Time   NA 140 08/13/2015 0500   K 4.4 08/13/2015 0500   CL 97* 08/13/2015 0500   CO2 31 08/13/2015 0500   GLUCOSE 118* 08/13/2015 0500   BUN 82* 08/13/2015 0500   CREATININE 3.30* 08/13/2015 0500   CALCIUM 7.4* 08/13/2015 0500   PROT 5.3* 08/10/2015 0234   ALBUMIN 2.1* 08/13/2015 0500   AST 12* 08/10/2015 0234   ALT 10* 08/10/2015 0234   ALKPHOS 75 08/10/2015 0234   BILITOT 0.5 08/10/2015 0234   GFRNONAA 13* 08/13/2015 0500   GFRAA 15* 08/13/2015 0500       Problem List:  Patient Active Problem List   Diagnosis Date Noted  . Acute renal failure superimposed on stage 4 chronic kidney disease ([place]) 08/13/2015  . Dyspnea   . Goals of care, counseling/discussion   . SOB (shortness of breath)   .  Encounter for palliative care   . Pulmonary vascular congestion   . Pleural effusion, left   . Acute on chronic renal failure ([place])   . HLD (hyperlipidemia)   . Noncompliance with medication regimen   . Disorientation   . Somnolence   . Acute respiratory failure with hypercapnia ([place])   . Respiratory acidosis   . Hypernatremia   . Systolic and diastolic CHF, acute ([place])   . CHF, acute on chronic ([place]) 08/05/2015  . Acute renal failure superimposed on stage 3 chronic kidney disease ([place]) 08/05/2015  . Elevated troponin 08/05/2015  . CHF (congestive heart  failure) ([place]) 08/05/2015  . Acute on chronic heart failure ([place]) 08/05/2015  . Acute on chronic combined systolic and diastolic congestive heart failure ([place])   . AKI (acute kidney injury) ([place])   . Left carotid bruit 11/20/2014  . Syncope and collapse 06/10/2014  . Syncope 06/10/2014  . Essential hypertension   . Unspecified vitamin D deficiency   . Cellulitis and abscess of right lower extremitiy 02/28/2014  . Chronic combined systolic and diastolic heart failure ([place]) 02/12/2014  . CAD (coronary artery disease)   . Obesity   . Meningioma ([place])   . Anemia   . CKD stage 3 due to type 2 diabetes mellitus ([place]) 01/24/2014  . Hyperlipidemia 01/22/2014  . BPPV (benign paroxysmal positional vertigo) 01/22/2014  . Tobacco abuse 01/21/2014  . Diabetes mellitus due to abnormal insulin ([place]) 01/21/2014     Palliative Care Assessment & Plan    1.Code Status:  DNR    Code Status Orders        Start     Ordered   08/11/15 0922  Do not attempt resuscitation (DNR)   Continuous    Question Answer Comment  In the event of cardiac or respiratory ARREST Do not call a "code blue"   In the event of cardiac or respiratory ARREST Do not perform Intubation, CPR, defibrillation or ACLS   In the event of cardiac or respiratory ARREST Use medication by any route, position, wound care, and other measures to relive pain and suffering. May use oxygen, suction and manual treatment of airway obstruction as needed for comfort.   Comments NO Dialysis      08/11/15 0923       2. Goals of Care/Additional Recommendations:  DNR / DNI  No hemodialysis.  I met with Ms. Alfredo and her son, [name]. We reviewed a MOST form and discussed how to develop plan of care to focus on continuing therapies that would maximize chance of being well enough to return home and limiting therapies not in line with this goal.  We discussed heroic interventions at the end-of-life. There is agreement this would not be in  line with prior expressed wishes for a natural death or be likely to lead to getting well enough to return to her home.  We completed MOST form today. DNR, Limited additional interventions, IVF and ABX if indicated, no feeding tube. We also discussed her wish not to have hemodialysis. I made recommendation to add this to most form documentation as well, however patient did not want to do this today. She does maintain that she would not want hemodialysis. I completed a durable DO NOT RESUSCITATE and placed it on the chart as well.  Desire for further Chaplaincy support: yes,  Chaplain will assist with formal paperwork to designate son [name] as HCPOA  Psycho-social Needs: Caregiving  Support/Resources    3. Symptom Management: Continue low  dose PO dilaudid PRN leg/back pain.  4. Palliative Prophylaxis:   Bowel Regimen, Frequent Pain Assessment and Turn Reposition  5. Prognosis: < 12 months  Difficult to determine at this point.    6. Discharge Planning: [facility] for rehab with Palliative care service follow-up   Care plan was discussed with patient at bedside.  Thank you for allowing the Palliative Medicine Team to assist in the care of this patient.   Time In: 1215 Time Out: 1300 Total Time 45 min Prolonged Time Billed no        Micheline Rough, MD  08/13/2015, 4:14 PM  Please contact Palliative Medicine Team phone at [phone] for questions and concerns.

## 2015-08-13 NOTE — Care Management Important Message (Signed)
Important Message  Patient Details  Name: Kathleen [name] MRN: [id number] Date of Birth: 1946/01/10   Medicare Important Message Given:  Yes    Vence Lalor P Jazmine Heckman 08/13/2015, 2:16 PM

## 2015-08-13 NOTE — NC FL2 (Signed)
[place] LEVEL OF CARE SCREENING TOOL     IDENTIFICATION  Patient Name: Kathleen [name] Birthdate: April 13, 1946 Sex: female Admission Date (Current Location): 08/05/2015  [facility] and [place] Number:  Herbalist and Address:         Provider Number:    Attending Physician Name and Address:  Orson Eva, MD  Relative Name and Phone Number:       Current Level of Care: Hospital Recommended Level of Care: [place] Prior Approval Number:    Date Approved/Denied:   PASRR Number: [id number] (Eff. 06/12/14)  Discharge Plan: SNF    Current Diagnoses: Patient Active Problem List   Diagnosis Date Noted  . Acute renal failure superimposed on stage 4 chronic kidney disease ([place]) 08/13/2015  . Dyspnea   . Goals of care, counseling/discussion   . SOB (shortness of breath)   . Encounter for palliative care   . Pulmonary vascular congestion   . Pleural effusion, left   . Acute on chronic renal failure ([place])   . HLD (hyperlipidemia)   . Noncompliance with medication regimen   . Disorientation   . Somnolence   . Acute respiratory failure with hypercapnia ([place])   . Respiratory acidosis   . Hypernatremia   . Systolic and diastolic CHF, acute ([place])   . CHF, acute on chronic ([place]) 08/05/2015  . Acute renal failure superimposed on stage 3 chronic kidney disease ([place]) 08/05/2015  . Elevated troponin 08/05/2015  . CHF (congestive heart failure) ([place]) 08/05/2015  . Acute on chronic heart failure ([place]) 08/05/2015  . Acute on chronic combined systolic and diastolic congestive heart failure ([place])   . AKI (acute kidney injury) ([place])   . Left carotid bruit 11/20/2014  . Syncope and collapse 06/10/2014  . Syncope 06/10/2014  . Essential hypertension   . Unspecified vitamin D deficiency   . Cellulitis and abscess of right lower extremitiy 02/28/2014  . Chronic combined systolic and diastolic heart failure ([place]) 02/12/2014  . CAD (coronary artery  disease)   . Obesity   . Meningioma ([place])   . Anemia   . CKD stage 3 due to type 2 diabetes mellitus ([place]) 01/24/2014  . Hyperlipidemia 01/22/2014  . BPPV (benign paroxysmal positional vertigo) 01/22/2014  . Tobacco abuse 01/21/2014  . Diabetes mellitus due to abnormal insulin ([place]) 01/21/2014    Orientation RESPIRATION BLADDER Height & Weight    Self, Time, Situation, Place  O2 (2L oxygen) Continent, Indwelling catheter (Patient has a urinary catheter)   206 lbs.  BEHAVIORAL SYMPTOMS/MOOD NEUROLOGICAL BOWEL NUTRITION STATUS      Continent Diet (Renal/carb modified)  AMBULATORY STATUS COMMUNICATION OF NEEDS Skin   Total Care (Patient unable to ambulate) Verbally Normal                       Personal Care Assistance Level of Assistance  Bathing, Feeding, Dressing Bathing Assistance: Maximum assistance Feeding assistance: Independent Dressing Assistance: Maximum assistance     Functional Limitations Info  Sight, Hearing, Speech Sight Info: Adequate Hearing Info: Adequate Speech Info: Adequate    SPECIAL CARE FACTORS FREQUENCY  PT (By licensed PT), OT (By licensed OT)     PT Frequency: Evaluated 08/09/15. 2X per week recommended  OT Frequency: Evaluated 08/09/13. 2x per week recommended             Contractures Contractures Info: Not present    Additional Factors Info  Code Status, Allergies, Insulin Sliding Scale Code Status  Info: DNR Allergies Info: No known allergies   Insulin Sliding Scale Info: 4 times per day sliding scale       Current Medications (08/13/2015):  This is the current hospital active medication list Current Facility-Administered Medications  Medication Dose Route Frequency Provider Last Rate Last Dose  . acetaminophen (TYLENOL) tablet 650 mg  650 mg Oral Q4H PRN Radene Gunning, NP      . antiseptic oral rinse (CPC / CETYLPYRIDINIUM CHLORIDE 0.05%) solution 7 mL  7 mL Mouth Rinse BID Allie Bossier, MD   7 mL at 08/13/15 0800  . aspirin  tablet 325 mg  325 mg Oral Daily Radene Gunning, NP   325 mg at 08/13/15 1128  . atorvastatin (LIPITOR) tablet 20 mg  20 mg Oral q1800 Allie Bossier, MD   20 mg at 08/12/15 1713  . brimonidine (ALPHAGAN) 0.2 % ophthalmic solution 1 drop  1 drop Right Eye Q12H Waldemar Dickens, MD   1 drop at 08/13/15 1130   And  . timolol (TIMOPTIC) 0.5 % ophthalmic solution 1 drop  1 drop Right Eye Q12H Waldemar Dickens, MD   1 drop at 08/13/15 1127  . carvedilol (COREG) tablet 12.5 mg  12.5 mg Oral BID WC Radene Gunning, NP   12.5 mg at 08/13/15 [id number]  . feeding supplement (NEPRO CARB STEADY) liquid 237 mL  237 mL Oral BID BM Jenifer A Williams, RD   237 mL at 08/13/15 1000  . ferumoxytol (FERAHEME) 510 mg in sodium chloride 0.9 % 100 mL IVPB  510 mg Intravenous Weekly Jamal Maes, MD   510 mg at 08/13/15 1308  . furosemide (LASIX) tablet 160 mg  160 mg Oral BID Jamal Maes, MD   160 mg at 08/13/15 [id number]  . heparin injection 5,000 Units  5,000 Units Subcutaneous 3 times per day Radene Gunning, NP   5,000 Units at 08/13/15 1453  . hydrALAZINE (APRESOLINE) tablet 50 mg  50 mg Oral 3 times per day Radene Gunning, NP   50 mg at 08/13/15 1453  . HYDROmorphone (DILAUDID) tablet 2 mg  2 mg Oral Q4H PRN Melton Alar, PA-C   2 mg at 08/12/15 2206  . insulin aspart (novoLOG) injection 0-20 Units  0-20 Units Subcutaneous TID WC Radene Gunning, NP   4 Units at 08/13/15 1307  . insulin aspart (novoLOG) injection 0-5 Units  0-5 Units Subcutaneous QHS Radene Gunning, NP   0 Units at 08/05/15 2200  . insulin detemir (LEVEMIR) injection 7 Units  7 Units Subcutaneous QHS Radene Gunning, NP   7 Units at 08/12/15 2309  . isosorbide mononitrate (IMDUR) 24 hr tablet 30 mg  30 mg Oral Daily Radene Gunning, NP   30 mg at 08/13/15 1129  . meclizine (ANTIVERT) tablet 12.5 mg  12.5 mg Oral TID PRN Radene Gunning, NP      . ondansetron [facility]) injection 4 mg  4 mg Intravenous Q6H PRN Radene Gunning, NP         Discharge Medications: Please  see discharge summary for a list of discharge medications.  Relevant Imaging Results:  Relevant Lab Results:   Additional Information    Sharlet Salina, Mila Homer, LCSW

## 2015-08-13 NOTE — Progress Notes (Signed)
[facility] Rounding Note  Subjective:   Pt has indicated  that she would not undergo dialysis (her mother, Marney Setting, was on HD) Family met with palliative  - DNR/DNI/no dialysis She reiterates that decision to me and is comfortable with it. I support her decision  Continues to diurese Says she feels OK today but looks tired  Objective  Vital signs in last 24 hours: Filed Vitals:   08/12/15 1627 08/12/15 2100 08/13/15 0640 08/13/15 1008  BP: 156/55 148/78 163/57 145/76  Pulse: 72 76 85 80  Temp: 98.7 F (37.1 C) 98.8 F (37.1 C) 98 F (36.7 C) 98.4 F (36.9 C)  TempSrc: Oral Oral Oral Oral  Resp: '20 18 18 18  ' Height:      Weight:      SpO2: 99% 98% 93% 100%   Weight change:   Intake/Output Summary (Last 24 hours) at 08/13/15 1121 Last data filed at 08/13/15 0909  Gross per 24 hour  Intake    720 ml  Output   3100 ml  Net  -2380 ml    Physical Exam: BP 145/76 mmHg  Pulse 80  Temp(Src) 98.4 F (36.9 C) (Oral)  Resp 18  Ht '5\' 3"'  (1.6 m)  Wt 93.5 kg (206 lb 2.1 oz)  BMI 36.52 kg/m2  SpO2 100%  OOB in the chair Awake, alert, just looks tired Crackles bilaterally just at bases - improved Regular S1S2 No S3S4 1/6 murmur LSB Abd obese soft ntnd  LE's/ UE's 1+ edema LE's. Chronic skin changes.   Recent Labs Lab 08/07/15 0505 08/08/15 0324 08/09/15 0216 08/10/15 0234 08/11/15 0227 08/12/15 1220 08/13/15 0500  NA 147* 143 139 139 139 139 140  K 4.8 5.0 5.4* 5.7* 5.1 4.9 4.4  CL 110 107 102 103 101 98* 97*  CO2 '26 27 27 26 28 30 31  ' GLUCOSE 106* 178* 143* 173* 185* 120* 118*  BUN 57* 64* 70* 73* 77* 80* 82*  CREATININE 3.60* 3.73* 3.81* 3.73* 3.71* 3.57* 3.30*  CALCIUM 7.1* 6.9* 7.2* 7.3* 7.6* 7.6* 7.4*  PHOS  --   --   --   --  7.0* 6.2* 5.9*     Recent Labs Lab 08/08/15 0324 08/09/15 0216 08/10/15 0234 08/11/15 0227 08/12/15 1220 08/13/15 0500  AST 12* 14* 12*  --   --   --   ALT 10* 10* 10*  --   --   --   ALKPHOS  72 80 75  --   --   --   BILITOT 0.4 0.5 0.5  --   --   --   PROT 5.1* 5.6* 5.3*  --   --   --   ALBUMIN 2.1* 2.1* 2.1* 2.1* 2.4* 2.1*      Recent Labs Lab 08/07/15 0505 08/08/15 0324 08/09/15 0216 08/10/15 0234 08/11/15 0227 08/13/15 0428  WBC 4.3 5.5 5.9 6.2 4.9 3.9*  NEUTROABS 2.6 3.4 3.7 4.5  --   --   HGB 9.2* 9.0* 9.8* 9.1* 9.2* 8.5*  HCT 31.8* 31.7* 34.2* 31.8* 32.0* 28.7*  MCV 88.1 88.8 89.5 89.3 89.1 87.5  PLT 260 209 217 227 245 230     Recent Labs Lab 08/11/15 2115 08/12/15 0920 08/12/15 1211 08/12/15 1624 08/13/15 0755  GLUCAP 167* 123* 113* 158* 107*   UA - Protein >300 0-5 rbc, 0-5 wbc, 0-5 epi CXR 12/29 vasc congestion  Medications:   . antiseptic oral rinse  7 mL Mouth Rinse BID  . aspirin  325 mg Oral Daily  . atorvastatin  20 mg Oral q1800  . brimonidine  1 drop Right Eye Q12H   And  . timolol  1 drop Right Eye Q12H  . carvedilol  12.5 mg Oral BID WC  . feeding supplement (NEPRO CARB STEADY)  237 mL Oral BID BM  . furosemide  160 mg Oral BID  . heparin  5,000 Units Subcutaneous 3 times per day  . hydrALAZINE  50 mg Oral 3 times per day  . insulin aspart  0-20 Units Subcutaneous TID WC  . insulin aspart  0-5 Units Subcutaneous QHS  . insulin detemir  7 Units Subcutaneous QHS  . isosorbide mononitrate  30 mg Oral Daily    Background:  70 y.o. year-old with hx of HTN, DM2, CAD, CHF and CKD who presented on 12/28 with confusion, SOB, +orthopnea, sig LE edema on admission. Admitted w dx of a/c diast HF, hypercapnic resp failure and started on IV lasix. ECHO 12/29 LVEF 40-45%.  Known (progressive) CKD with most recent baseline creatinine prior to admission of 2.52 04/2015 (GFR 21 Stage 4) and on admission 12/28 was 3.8 (GFR 13). Had never seen Nephrology as outpt. We were asked to see. She has decided AGAINST any form of renal replacement therapy - either short or long term. .  Assessment/Recommendations  1. AKI on CKD4 - Creatinine has  started to improve a little but GFR still around 13. She has indicated that she WOULD NOT undergo dialysis treatments. Understands that if no dialysis when the time comes, then comfort measures only option. Palliative Care has seen and their note reviewed.   2. Volume overload/edema - good diuresis past 24 hours with 2.8 liters of UOP. Changed from IV to po lasix 160 BID - got po dose yesterday evening - 2.4 liter UOP/24 hours.  Continue to monitor on this dose  3. Hyperkalemia - required Kayexalate 1/2.  Resolved. 4. Anemia -TSat low at 11%. Will go ahead with a dose of Feraheme (she is clinically doing better and a better Hb may help her feel better so will go ahedad and correct her fe Def). 5. Acute systolic and diastolic CHF - continues to diurese and with that creatinine may be starting to fall some. 6. DM 7. HTN 8. Hypercapnic resp failure - improved some  9. LLE calf wound. WOC seeing. Partial thickness skin loss described.   Jamal Maes, MD [facility] [phone] Pager 08/13/2015, 11:21 AM

## 2015-08-13 NOTE — Progress Notes (Signed)
PROGRESS NOTE  MERLINDA MCCLEAN [id number] DOB: 1945/08/23 DOA: 08/05/2015 PCP: Benito Mccreedy, MD  Admit HPI / Brief Narrative: 70 yo F Hx HTN, DM2, HLD, CKD, CAD, and Chronic Combined CHF (01/2014 EF 25-30% by echo) who presented to the ED c/o gradual worsening shortness of breath, increased lower extremity edema, and worsening orthopnea.   In the emergency department she was afebrile and hemodynamically stable w/ oxygen sat 90% on room air.    Assessment/Plan:  Toxic metabolic encephalopathy - resolved  -felt to be due to hypercapnia - pt is now alert and conversant - back to her baseline mental statsus   Acute Respiratory Failure w/ Hypercapnia Respiratory Acidosis -off BIPAP - resp status appears to have stabilized - attempt to wean from O2 -presently stable on 1 L [place]  Acute on CKD Stage 4 (Baseline ~2.5) -patient's renal failure has worsened over the last year -baseline was 2.1-2.5  -pt does not desire HD, even if it means changing to comfort care only -Nephrology continues to follow and is directing diuretic regimen - transitioning to oral lasix 08/12/15 -appreciate Dr. Lorrene Reid  Hyperkalemia -corrected w/ kayexalate   Acute exacerbation of chronic combined Systolic and Diastolic CHF -EF [id number] w/ grade I DD per TTE this admit  -admit weight 93 kg- presently 93.5kg  -diuresis per Nephrology -NEG 7.9 L for the admission -dry weight appears to be near 200 lbs  Pulmonary HTN  Normocytic Anemia/Anemia of CKD -due to CKD - -IV FeraHeme on 08/13/15  DM -CBG reasonably well controlled at this time  -08/06/15 A1C = 7.3 -continue novolog ISS  Hypernatremia -resolved   HLD -Continue Lipitor 20 mg daily  LLE calf wound -as per WOC - appears to be ruptured old blister -not infected on exam  Code Status: DNR Family Communication: son updated at bedside Disposition Plan: SNF 08/14/15 if okay with renal  Consultants: Nephrology [facility]   Palliative Care  Procedures: 12/29 TTE - LV severeconcentric hypertrophy - EF [id number] - grade 1diastolic dysfunction - Tricuspid valve: moderate regurgitation - PA peak pressure: 45 mm Hg         Procedures/Studies: Dg Chest 2 View  08/05/2015  CLINICAL DATA:  Shortness of breath, worsening over last month. Dry cough. EXAM: CHEST  2 VIEW COMPARISON:  06/10/2014 FINDINGS: Cardiomegaly with vascular congestion. No overt edema. No confluent opacities. Small bilateral effusions on the lateral view. No acute bony abnormality. IMPRESSION: Cardiomegaly with vascular congestion and small effusions. Electronically Signed   By: Rolm Baptise M.D.   On: 08/05/2015 11:52   Dg Chest Port 1 View  08/06/2015  CLINICAL DATA:  Pulmonary edema, increased somnolence, history CHF, diabetes mellitus, stage III chronic kidney disease, hypertension, coronary artery disease, former smoker EXAM: PORTABLE CHEST 1 VIEW COMPARISON:  Portable exam 0858 hours compared to 08/06/2015 FINDINGS: Enlargement of cardiac silhouette with pulmonary vascular congestion. Mediastinal contour stable. Slight rotation to the RIGHT. Minimal chronic accentuation of interstitial markings could reflect minimal chronic edema. Minimal atelectasis and questionable tiny pleural effusion at LEFT base. No segmental consolidation or pneumothorax. IMPRESSION: Enlargement of cardiac silhouette with pulmonary vascular congestion and question minimal chronic failure. Minimal LEFT basilar atelectasis and questionable pleural effusion. Electronically Signed   By: Lavonia Dana M.D.   On: 08/06/2015 09:04   Dg Chest Port 1 View  08/06/2015  CLINICAL DATA:  Acute onset of worsening shortness of breath. Initial encounter. EXAM: PORTABLE CHEST 1 VIEW COMPARISON:  Chest  radiograph performed 08/05/2015 FINDINGS: The lungs are well-aerated. Vascular congestion is noted. Mildly increased interstitial markings could reflect minimal interstitial edema. There is no  evidence of pleural effusion or pneumothorax. The cardiomediastinal silhouette is enlarged. No acute osseous abnormalities are seen. IMPRESSION: Vascular congestion and cardiomegaly. Mildly increased interstitial markings could reflect minimal interstitial edema. Electronically Signed   By: Garald Balding M.D.   On: 08/06/2015 00:38        Subjective: Patient denies fevers, chills, headache, chest pain, dyspnea, nausea, vomiting, diarrhea, abdominal pain, dysuria, hematuria   Objective: Filed Vitals:   08/12/15 1627 08/12/15 2100 08/13/15 0640 08/13/15 1008  BP: 156/55 148/78 163/57 145/76  Pulse: 72 76 85 80  Temp: 98.7 F (37.1 C) 98.8 F (37.1 C) 98 F (36.7 C) 98.4 F (36.9 C)  TempSrc: Oral Oral Oral Oral  Resp: 20 18 18 18   Height:      Weight:      SpO2: 99% 98% 93% 100%    Intake/Output Summary (Last 24 hours) at 08/13/15 1337 Last data filed at 08/13/15 0909  Gross per 24 hour  Intake    720 ml  Output   3100 ml  Net  -2380 ml   Weight change:  Exam:   General:  Pt is alert, follows commands appropriately, not in acute distress  HEENT: No icterus, No thrush, No neck mass, [place]/AT  Cardiovascular: RRR, S1/S2, no rubs, no gallops  Respiratory: Bibasilar crackles. No wheezing. Good air movement  Abdomen: Soft/+BS, non tender, non distended, no guarding; no HSM  Extremities: 1+ LE edema, No lymphangitis, No petechiae, No rashes, no synovitis  Data Reviewed: Basic Metabolic Panel:  Recent Labs Lab 08/07/15 0505 08/08/15 0324 08/09/15 0216 08/10/15 0234 08/11/15 0227 08/12/15 1220 08/13/15 0500  NA 147* 143 139 139 139 139 140  K 4.8 5.0 5.4* 5.7* 5.1 4.9 4.4  CL 110 107 102 103 101 98* 97*  CO2 26 27 27 26 28 30 31   GLUCOSE 106* 178* 143* 173* 185* 120* 118*  BUN 57* 64* 70* 73* 77* 80* 82*  CREATININE 3.60* 3.73* 3.81* 3.73* 3.71* 3.57* 3.30*  CALCIUM 7.1* 6.9* 7.2* 7.3* 7.6* 7.6* 7.4*  MG 1.6* 2.1 2.0 2.0  --   --   --   PHOS  --   --   --    --  7.0* 6.2* 5.9*   Liver Function Tests:  Recent Labs Lab 08/07/15 0505 08/08/15 0324 08/09/15 0216 08/10/15 0234 08/11/15 0227 08/12/15 1220 08/13/15 0500  AST 12* 12* 14* 12*  --   --   --   ALT 11* 10* 10* 10*  --   --   --   ALKPHOS 75 72 80 75  --   --   --   BILITOT 0.7 0.4 0.5 0.5  --   --   --   PROT 5.2* 5.1* 5.6* 5.3*  --   --   --   ALBUMIN 2.1* 2.1* 2.1* 2.1* 2.1* 2.4* 2.1*   No results for input(s): LIPASE, AMYLASE in the last 168 hours. No results for input(s): AMMONIA in the last 168 hours. CBC:  Recent Labs Lab 08/07/15 0505 08/08/15 0324 08/09/15 0216 08/10/15 0234 08/11/15 0227 08/13/15 0428  WBC 4.3 5.5 5.9 6.2 4.9 3.9*  NEUTROABS 2.6 3.4 3.7 4.5  --   --   HGB 9.2* 9.0* 9.8* 9.1* 9.2* 8.5*  HCT 31.8* 31.7* 34.2* 31.8* 32.0* 28.7*  MCV 88.1 88.8 89.5 89.3 89.1 87.5  PLT 260 209 217 227 245 230   Cardiac Enzymes: No results for input(s): CKTOTAL, CKMB, CKMBINDEX, TROPONINI in the last 168 hours. BNP: Invalid input(s): POCBNP CBG:  Recent Labs Lab 08/12/15 0920 08/12/15 1211 08/12/15 1624 08/13/15 0755 08/13/15 1140  GLUCAP 123* 113* 158* 107* 171*    Recent Results (from the past 240 hour(s))  MRSA PCR Screening     Status: None   Collection Time: 08/05/15  6:53 PM  Result Value Ref Range Status   MRSA by PCR NEGATIVE NEGATIVE Final    Comment:        The GeneXpert MRSA Assay (FDA approved for NASAL specimens only), is one component of a comprehensive MRSA colonization surveillance program. It is not intended to diagnose MRSA infection nor to guide or monitor treatment for MRSA infections.      Scheduled Meds: . antiseptic oral rinse  7 mL Mouth Rinse BID  . aspirin  325 mg Oral Daily  . atorvastatin  20 mg Oral q1800  . brimonidine  1 drop Right Eye Q12H   And  . timolol  1 drop Right Eye Q12H  . carvedilol  12.5 mg Oral BID WC  . feeding supplement (NEPRO CARB STEADY)  237 mL Oral BID BM  . ferumoxytol  510 mg  Intravenous Weekly  . furosemide  160 mg Oral BID  . heparin  5,000 Units Subcutaneous 3 times per day  . hydrALAZINE  50 mg Oral 3 times per day  . insulin aspart  0-20 Units Subcutaneous TID WC  . insulin aspart  0-5 Units Subcutaneous QHS  . insulin detemir  7 Units Subcutaneous QHS  . isosorbide mononitrate  30 mg Oral Daily   Continuous Infusions:    Lizzie An, DO  [facility] Pager [phone]  If 7PM-7AM, please contact night-coverage [email or web address] Password [email or web address] 08/13/2015, 1:37 PM   LOS: 8 days

## 2015-08-14 LAB — RENAL FUNCTION PANEL
Albumin: 2.4 g/dL — ABNORMAL LOW (ref 3.5–5.0)
Anion gap: 9 (ref 5–15)
BUN: 77 mg/dL — AB (ref 6–20)
CALCIUM: 7.7 mg/dL — AB (ref 8.9–10.3)
CHLORIDE: 96 mmol/L — AB (ref 101–111)
CO2: 35 mmol/L — ABNORMAL HIGH (ref 22–32)
CREATININE: 3.17 mg/dL — AB (ref 0.44–1.00)
GFR calc Af Amer: 16 mL/min — ABNORMAL LOW (ref >60)
GFR, EST NON AFRICAN AMERICAN: 14 mL/min — AB (ref >60)
Glucose, Bld: 106 mg/dL — ABNORMAL HIGH (ref 65–99)
Phosphorus: 4.5 mg/dL (ref 2.5–4.6)
Potassium: 4.2 mmol/L (ref 3.5–5.1)
SODIUM: 140 mmol/L (ref 135–145)

## 2015-08-14 LAB — GLUCOSE, CAPILLARY
GLUCOSE-CAPILLARY: 94 mg/dL (ref 65–99)
Glucose-Capillary: 164 mg/dL — ABNORMAL HIGH (ref 65–99)
Glucose-Capillary: 149 mg/dL — ABNORMAL HIGH (ref 65–99)

## 2015-08-14 MED ORDER — BISACODYL 10 MG RE SUPP
10.0000 mg | Freq: Once | RECTAL | Status: AC
  Administered 2015-08-14: 10 mg via RECTAL
  Filled 2015-08-14: qty 1

## 2015-08-14 MED ORDER — NEPRO/CARBSTEADY PO LIQD: 237.0000 mL | Freq: Two times a day (BID) | ORAL | Status: DC

## 2015-08-14 MED ORDER — POLYETHYLENE GLYCOL 3350 17 G PO PACK
17.0000 g | PACK | Freq: Every day | ORAL | Status: DC
  Administered 2015-08-14: 17 g via ORAL

## 2015-08-14 MED ORDER — ATORVASTATIN CALCIUM 20 MG PO TABS: 20.0000 mg | ORAL_TABLET | Freq: Every day | ORAL | Status: DC

## 2015-08-14 MED ORDER — FUROSEMIDE 80 MG PO TABS: 160.0000 mg | ORAL_TABLET | Freq: Two times a day (BID) | ORAL | Status: DC

## 2015-08-14 NOTE — Progress Notes (Signed)
Patient ha a big bowel movement post miralax and suppository.Attenmpted to give report at [facility] responded the would call us back  Here.

## 2015-08-14 NOTE — Clinical Social Work Note (Signed)
Clinical Social Work Assessment  Patient Details  Name: Kathleen [name] MRN: [id number] Date of Birth: 1945-11-06  Date of referral:  08/13/15               Reason for consult:  Facility Placement                Permission sought to share information with:  Family Supports Permission granted to share information::  Yes, Verbal Permission Granted  Name::     (1) [name] and (2) [place]::     Relationship::  Son and daughter  Sport and exercise psychologist Information:  (1) [phone] and (2) [phone]  Housing/Transportation Living arrangements for the past 2 months:  Single Family Home (Patient indicated that she lives between both of her children.) Source of Information:  Patient Patient Interpreter Needed:  None Criminal Activity/Legal Involvement Pertinent to Current Situation/Hospitalization:  No - Comment as needed Significant Relationships:  Adult Children Lives with:  Adult Children Do you feel safe going back to the place where you live?  Yes Need for family participation in patient care:  Yes (Comment)  Care giving concerns:  None expressed by patient.   Social Worker assessment / plan:  CSW talked with patient at the bedside regarding discharge planning and ST rehab. Patient was sitting up in bed and was alert/oriented and able to talk with CSW.  Ms. Monce agreeable to ST rehab and her facility preference is [facility].  Employment status:  Retired Forensic scientist:  Medicare PT Recommendations:  [place] / Referral to community resources:  [place] (Patient had a facility preference)  Patient/Family's Response to care:  No concerns expressed by patient.  Patient/Family's Understanding of and Emotional Response to Diagnosis, Current Treatment, and Prognosis:  Not discussed.  Emotional Assessment Appearance:  Appears stated age Attitude/Demeanor/Rapport:  Other (Appropriate) Affect (typically observed):   Appropriate Orientation:  Oriented to Self, Oriented to Place, Oriented to  Time, Oriented to Situation Alcohol / Substance use:  Tobacco Use (Patient reported that she quit smoking and does not drink or use illicit drugs.) Psych involvement (Current and /or in the community):  No (Comment)  Discharge Needs  Concerns to be addressed:  Discharge Planning Concerns Readmission within the last 30 days:  No Current discharge risk:  None Barriers to Discharge:  No Barriers Identified   Sable Feil, LCSW 08/14/2015, 12:40 PM

## 2015-08-14 NOTE — Progress Notes (Signed)
Foley Catheter discontinued at this time.No skin issues on perianal and peri-inguinal area including the anchored site.Awaiting discharge to SNF.No skin issues other than small healing blister on the left post. with small mapilex foam.

## 2015-08-14 NOTE — Progress Notes (Signed)
Report given to [facility]. R.N.. of [place]

## 2015-08-14 NOTE — Clinical Social Work Placement (Addendum)
   CLINICAL SOCIAL WORK PLACEMENT  NOTE 08/14/15 - DISCHARGED TO [place]  Date:  08/14/2015  Patient Details  Name: Kathleen [name] MRN: [id number] Date of Birth: 02/10/1946  Clinical Social Work is seeking post-discharge placement for this patient at the [place] level of care (*CSW will initial, date and re-position this form in  chart as items are completed):  No (Patient had facility preference)   Patient/family provided with [facility] Work Department's list of facilities offering this level of care within the geographic area requested by the patient (or if unable, by the patient's family).      Patient/family informed of their freedom to choose among providers that offer the needed level of care, that participate in Medicare, Medicaid or managed care program needed by the patient, have an available bed and are willing to accept the patient.      Patient/family informed of [facility]'s ownership interest in [facility] and [facility], as well as of the fact that they are under no obligation to receive care at these facilities.  PASRR submitted to EDS on       PASRR number received on       Existing PASRR number confirmed on 08/13/15     FL2 transmitted to all facilities in geographic area requested by pt/family on 08/13/15     FL2 transmitted to all facilities within larger geographic area on       Patient informed that his/her managed care company has contracts with or will negotiate with certain facilities, including the following:        Yes   Patient/family informed of bed offers received.  Patient chooses bed at [facility]     Physician recommends and patient chooses bed at      Patient to be transferred to [facility] on 08/14/15.  Patient to be transferred to facility by [facility])     Patient family notified on 08/14/15 of transfer.  Name of family member notified:  Catrena Thurn  at the bedside.    PHYSICIAN       Additional Comment:    _______________________________________________ Sable Feil, LCSW 08/14/2015, 1:05 PM

## 2015-08-14 NOTE — Discharge Summary (Signed)
Physician Discharge Summary  [name] [id number] DOB: 1946-08-07 DOA: 08/05/2015  PCP: Benito Mccreedy, MD  Admit date: 08/05/2015 Discharge date: 08/14/2015  Recommendations for Outpatient Follow-up:  1. Pt will need to follow up with PCP in 2 weeks post discharge 2. Please obtain BMP on 08/20/15 and fax results to Dr. Erling Cruz 3. Please transport patient to follow up with nephrology, Dr. Ollen Gross Powell--09/02/2015 at 11 AM  Discharge Diagnoses:  Toxic metabolic encephalopathy - resolved  -felt to be due to hypercapnia - pt is now alert and conversant - back to her baseline mental statsus   Acute Respiratory Failure w/ Hypercapnia Respiratory Acidosis -off BIPAP - resp status appears to have stabilized - attempt to wean from O2 -presently stable on on RA  Acute on CKD Stage 4 (Baseline ~2.5) -patient's renal failure has worsened over the last year -baseline was 2.1-2.5  -pt does not desire HD, even if it means changing to comfort care only -Nephrology continues to follow and is directing diuretic regimen - transitioning to oral lasix 08/12/15 -appreciate Dr. Lorrene Reid -08/14/15--case discussed with Dr. Sharion Settler for d/c with furosemide 160 mg po bid  Hyperkalemia -corrected w/ kayexalate   Acute exacerbation of chronic combined Systolic and Diastolic CHF -EF [id number] w/ grade I DD per TTE this admit  -admit weight 93 kg- presently 93.5kg  -diuresis per Nephrology -NEG 10 L for the admission -dry weight appears to be near 200 lbs -d/c weight 206 pounds  Pulmonary HTN  Normocytic Anemia/Anemia of CKD -due to CKD - -IV FeraHeme on 08/13/15  DM -CBG reasonably well controlled at this time  -08/06/15 A1C = 7.3 -continue novolog ISS -d/c with previous home regimen  Hypernatremia -resolved   HLD -Continue Lipitor 20 mg daily  LLE calf wound -as per WOC - appears to be ruptured old blister -not infected on exam  Essential hypertension    -Continue carvedilol, hydralazine, Imdur     Code Status: DNR Family Communication: son updated at bedside   Consultants: Nephrology [facility] Cardiology  Palliative Care  Procedures: 12/29 TTE - LV severeconcentric hypertrophy - EF [id number] - grade 1diastolic dysfunction - Tricuspid valve: moderate regurgitation - PA peak pressure: 45 mm Hg  Discharge Condition: stable  Disposition: SNF Follow-up Information    Follow up with [name], MD.   Specialty:  Nephrology   Why:  09/02/2015--11 AM   Contact information:   [place] [address] [phone]     SNF  Diet:renal Wt Readings from Last 3 Encounters:  08/11/15 93.5 kg (206 lb 2.1 oz)  05/07/15 90.719 kg (200 lb)  11/20/14 87 kg (191 lb 12.8 oz)    History of present illness:  70 yo F Hx HTN, DM2, HLD, CKD, CAD, and Chronic Combined CHF (01/2014 EF 25-30% by echo) who presented to the ED c/o gradual worsening shortness of breath, increased lower extremity edema, and worsening orthopnea.   In the emergency department she was afebrile and hemodynamically stable w/ oxygen sat 90% on room air.  Consultants: Nephrology--Dr. Lorrene Reid  Discharge Exam: Filed Vitals:   08/14/15 0432 08/14/15 0818  BP: 139/51 160/60  Pulse: 70 73  Temp: 98.6 F (37 C) 98.2 F (36.8 C)  Resp: 18 18   Filed Vitals:   08/13/15 1501 08/13/15 2115 08/14/15 0432 08/14/15 0818  BP:  148/49 139/51 160/60  Pulse:  80 70 73  Temp:  98.4 F (36.9 C) 98.6 F (37 C) 98.2 F (36.8 C)  TempSrc:  Oral Oral Oral  Resp:  18 18 18   Height:      Weight:      SpO2: 90% 90% 97% 98%   General: A&O x 3, NAD, pleasant, cooperative Cardiovascular: RRR, no rub, no gallop, no S3 Respiratory: Fine bibasilar crackles. No wheeze Abdomen:soft, nontender, nondistended, positive bowel sounds Extremities: 1+LEedema, No lymphangitis, no petechiae  Discharge Instructions      Discharge Instructions    Diet -  low sodium heart healthy    Complete by:  As directed      Increase activity slowly    Complete by:  As directed             Medication List    STOP taking these medications        acetaminophen 325 MG tablet  Commonly known as:  TYLENOL     clindamycin 300 MG capsule  Commonly known as:  CLEOCIN     simvastatin 20 MG tablet  Commonly known as:  ZOCOR      TAKE these medications        aspirin 81 MG EC tablet  Take 1 tablet (81 mg total) by mouth daily.     atorvastatin 20 MG tablet  Commonly known as:  LIPITOR  Take 1 tablet (20 mg total) by mouth daily at 6 PM.     brimonidine-timolol 0.2-0.5 % ophthalmic solution  Commonly known as:  COMBIGAN  Place 1 drop into the right eye 2 (two) times daily as needed.     carvedilol 12.5 MG tablet  Commonly known as:  COREG  Take 12.5 mg by mouth 2 (two) times daily with a meal.     feeding supplement (NEPRO CARB STEADY) Liqd  Take 237 mLs by mouth 2 (two) times daily between meals.     furosemide 80 MG tablet  Commonly known as:  LASIX  Take 2 tablets (160 mg total) by mouth 2 (two) times daily.     hydrALAZINE 50 MG tablet  Commonly known as:  APRESOLINE  TAKE 1 TABLET (50 MG TOTAL) BY MOUTH 3 (THREE) TIMES DAILY.     insulin aspart 100 UNIT/ML injection  Commonly known as:  novoLOG  Inject 0-9 Units into the skin 3 (three) times daily with meals. CBG < 70: implement hypoglycemia protocol CBG 70 - 120: 0 units CBG 121 - 150: 1 unit CBG 151 - 200: 2 units CBG 201 - 250: 3 units CBG 251 - 300: 5 units CBG 301 - 350: 7 units CBG 351 - 400: 9 units CBG > 400: call MD.     insulin detemir 100 UNIT/ML injection  Commonly known as:  LEVEMIR  Inject 0.1 mLs (10 Units total) into the skin at bedtime.     isosorbide mononitrate 30 MG 24 hr tablet  Commonly known as:  IMDUR  TAKE 1 TABLET (30 MG TOTAL) BY MOUTH DAILY.     meclizine 12.5 MG tablet  Commonly known as:  ANTIVERT  Take 1 tablet (12.5 mg total) by mouth 3  (three) times daily as needed for dizziness or nausea.         The results of significant diagnostics from this hospitalization (including imaging, microbiology, ancillary and laboratory) are listed below for reference.    Significant Diagnostic Studies: Dg Chest 2 View  08/05/2015  CLINICAL DATA:  Shortness of breath, worsening  over last month. Dry cough. EXAM: CHEST  2 VIEW COMPARISON:  06/10/2014 FINDINGS: Cardiomegaly with vascular congestion. No overt edema. No confluent opacities. Small bilateral effusions on the lateral view. No acute bony abnormality. IMPRESSION: Cardiomegaly with vascular congestion and small effusions. Electronically Signed   By: Rolm Baptise M.D.   On: 08/05/2015 11:52   Dg Chest Port 1 View  08/06/2015  CLINICAL DATA:  Pulmonary edema, increased somnolence, history CHF, diabetes mellitus, stage III chronic kidney disease, hypertension, coronary artery disease, former smoker EXAM: PORTABLE CHEST 1 VIEW COMPARISON:  Portable exam 0858 hours compared to 08/06/2015 FINDINGS: Enlargement of cardiac silhouette with pulmonary vascular congestion. Mediastinal contour stable. Slight rotation to the RIGHT. Minimal chronic accentuation of interstitial markings could reflect minimal chronic edema. Minimal atelectasis and questionable tiny pleural effusion at LEFT base. No segmental consolidation or pneumothorax. IMPRESSION: Enlargement of cardiac silhouette with pulmonary vascular congestion and question minimal chronic failure. Minimal LEFT basilar atelectasis and questionable pleural effusion. Electronically Signed   By: Lavonia Dana M.D.   On: 08/06/2015 09:04   Dg Chest Port 1 View  08/06/2015  CLINICAL DATA:  Acute onset of worsening shortness of breath. Initial encounter. EXAM: PORTABLE CHEST 1 VIEW COMPARISON:  Chest radiograph performed 08/05/2015 FINDINGS: The lungs are well-aerated. Vascular congestion is noted. Mildly increased interstitial markings could reflect minimal  interstitial edema. There is no evidence of pleural effusion or pneumothorax. The cardiomediastinal silhouette is enlarged. No acute osseous abnormalities are seen. IMPRESSION: Vascular congestion and cardiomegaly. Mildly increased interstitial markings could reflect minimal interstitial edema. Electronically Signed   By: Garald Balding M.D.   On: 08/06/2015 00:38     Microbiology: Recent Results (from the past 240 hour(s))  MRSA PCR Screening     Status: None   Collection Time: 08/05/15  6:53 PM  Result Value Ref Range Status   MRSA by PCR NEGATIVE NEGATIVE Final    Comment:        The GeneXpert MRSA Assay (FDA approved for NASAL specimens only), is one component of a comprehensive MRSA colonization surveillance program. It is not intended to diagnose MRSA infection nor to guide or monitor treatment for MRSA infections.      Labs: Basic Metabolic Panel:  Recent Labs Lab 08/08/15 0324 08/09/15 0216 08/10/15 0234 08/11/15 0227 08/12/15 1220 08/13/15 0500 08/14/15 0707  NA 143 139 139 139 139 140 140  K 5.0 5.4* 5.7* 5.1 4.9 4.4 4.2  CL 107 102 103 101 98* 97* 96*  CO2 27 27 26 28 30 31  35*  GLUCOSE 178* 143* 173* 185* 120* 118* 106*  BUN 64* 70* 73* 77* 80* 82* 77*  CREATININE 3.73* 3.81* 3.73* 3.71* 3.57* 3.30* 3.17*  CALCIUM 6.9* 7.2* 7.3* 7.6* 7.6* 7.4* 7.7*  MG 2.1 2.0 2.0  --   --   --   --   PHOS  --   --   --  7.0* 6.2* 5.9* 4.5   Liver Function Tests:  Recent Labs Lab 08/08/15 0324 08/09/15 0216 08/10/15 0234 08/11/15 0227 08/12/15 1220 08/13/15 0500 08/14/15 0707  AST 12* 14* 12*  --   --   --   --   ALT 10* 10* 10*  --   --   --   --   ALKPHOS 72 80 75  --   --   --   --   BILITOT 0.4 0.5 0.5  --   --   --   --   PROT 5.1* 5.6*  5.3*  --   --   --   --   ALBUMIN 2.1* 2.1* 2.1* 2.1* 2.4* 2.1* 2.4*   No results for input(s): LIPASE, AMYLASE in the last 168 hours. No results for input(s): AMMONIA in the last 168 hours. CBC:  Recent Labs Lab  08/08/15 0324 08/09/15 0216 08/10/15 0234 08/11/15 0227 08/13/15 0428  WBC 5.5 5.9 6.2 4.9 3.9*  NEUTROABS 3.4 3.7 4.5  --   --   HGB 9.0* 9.8* 9.1* 9.2* 8.5*  HCT 31.7* 34.2* 31.8* 32.0* 28.7*  MCV 88.8 89.5 89.3 89.1 87.5  PLT 209 217 227 245 230   Cardiac Enzymes: No results for input(s): CKTOTAL, CKMB, CKMBINDEX, TROPONINI in the last 168 hours. BNP: Invalid input(s): POCBNP CBG:  Recent Labs Lab 08/13/15 1140 08/13/15 1650 08/13/15 2113 08/14/15 0759 08/14/15 1145  GLUCAP 171* 118* 215* 94 164*    Time coordinating discharge:  Greater than 30 minutes  Signed:  Sheilia Reznick, DO [facility] Pager: [phone] 08/14/2015, 12:03 PM

## 2015-08-19 ENCOUNTER — Encounter: Payer: Self-pay | Admitting: Surgery

## 2015-08-19 NOTE — Progress Notes (Signed)
ED CM received call from patient's daughter Skyeler Acoff regarding patient's home meds. She reports patient was discharged from hospital last week upon discharge her home meds were not returned to her. CM  Contacted [facility] inpatient pharmacy,meds were located. CM collected meds, daughter came to the ED and  picked up bag of medications. No further questions or concerns noted.

## 2015-08-25 ENCOUNTER — Ambulatory Visit: Payer: Medicare Other | Admitting: Podiatry

## 2015-10-12 DIAGNOSIS — N183 Chronic kidney disease, stage 3 (moderate): Secondary | ICD-10-CM | POA: Diagnosis not present

## 2015-10-12 DIAGNOSIS — S81801D Unspecified open wound, right lower leg, subsequent encounter: Secondary | ICD-10-CM | POA: Diagnosis not present

## 2015-10-12 DIAGNOSIS — S81802D Unspecified open wound, left lower leg, subsequent encounter: Secondary | ICD-10-CM | POA: Diagnosis not present

## 2015-10-12 DIAGNOSIS — I5040 Unspecified combined systolic (congestive) and diastolic (congestive) heart failure: Secondary | ICD-10-CM | POA: Diagnosis not present

## 2015-10-12 DIAGNOSIS — E785 Hyperlipidemia, unspecified: Secondary | ICD-10-CM | POA: Diagnosis not present

## 2015-10-12 DIAGNOSIS — D649 Anemia, unspecified: Secondary | ICD-10-CM | POA: Diagnosis not present

## 2015-10-12 DIAGNOSIS — Z794 Long term (current) use of insulin: Secondary | ICD-10-CM | POA: Diagnosis not present

## 2015-10-12 DIAGNOSIS — I2510 Atherosclerotic heart disease of native coronary artery without angina pectoris: Secondary | ICD-10-CM | POA: Diagnosis not present

## 2015-10-12 DIAGNOSIS — E119 Type 2 diabetes mellitus without complications: Secondary | ICD-10-CM | POA: Diagnosis not present

## 2015-10-12 DIAGNOSIS — I129 Hypertensive chronic kidney disease with stage 1 through stage 4 chronic kidney disease, or unspecified chronic kidney disease: Secondary | ICD-10-CM | POA: Diagnosis not present

## 2015-10-12 DIAGNOSIS — N390 Urinary tract infection, site not specified: Secondary | ICD-10-CM | POA: Diagnosis not present

## 2015-10-14 DIAGNOSIS — S81801D Unspecified open wound, right lower leg, subsequent encounter: Secondary | ICD-10-CM | POA: Diagnosis not present

## 2015-10-14 DIAGNOSIS — I2510 Atherosclerotic heart disease of native coronary artery without angina pectoris: Secondary | ICD-10-CM | POA: Diagnosis not present

## 2015-10-14 DIAGNOSIS — E119 Type 2 diabetes mellitus without complications: Secondary | ICD-10-CM | POA: Diagnosis not present

## 2015-10-14 DIAGNOSIS — N183 Chronic kidney disease, stage 3 (moderate): Secondary | ICD-10-CM | POA: Diagnosis not present

## 2015-10-14 DIAGNOSIS — I129 Hypertensive chronic kidney disease with stage 1 through stage 4 chronic kidney disease, or unspecified chronic kidney disease: Secondary | ICD-10-CM | POA: Diagnosis not present

## 2015-10-14 DIAGNOSIS — Z794 Long term (current) use of insulin: Secondary | ICD-10-CM | POA: Diagnosis not present

## 2015-10-14 DIAGNOSIS — S81802D Unspecified open wound, left lower leg, subsequent encounter: Secondary | ICD-10-CM | POA: Diagnosis not present

## 2015-10-14 DIAGNOSIS — I5040 Unspecified combined systolic (congestive) and diastolic (congestive) heart failure: Secondary | ICD-10-CM | POA: Diagnosis not present

## 2015-10-14 DIAGNOSIS — E785 Hyperlipidemia, unspecified: Secondary | ICD-10-CM | POA: Diagnosis not present

## 2015-10-14 DIAGNOSIS — D649 Anemia, unspecified: Secondary | ICD-10-CM | POA: Diagnosis not present

## 2015-10-19 DIAGNOSIS — I129 Hypertensive chronic kidney disease with stage 1 through stage 4 chronic kidney disease, or unspecified chronic kidney disease: Secondary | ICD-10-CM | POA: Diagnosis not present

## 2015-10-19 DIAGNOSIS — S81802D Unspecified open wound, left lower leg, subsequent encounter: Secondary | ICD-10-CM | POA: Diagnosis not present

## 2015-10-19 DIAGNOSIS — I5040 Unspecified combined systolic (congestive) and diastolic (congestive) heart failure: Secondary | ICD-10-CM | POA: Diagnosis not present

## 2015-10-19 DIAGNOSIS — I2510 Atherosclerotic heart disease of native coronary artery without angina pectoris: Secondary | ICD-10-CM | POA: Diagnosis not present

## 2015-10-19 DIAGNOSIS — D649 Anemia, unspecified: Secondary | ICD-10-CM | POA: Diagnosis not present

## 2015-10-19 DIAGNOSIS — E785 Hyperlipidemia, unspecified: Secondary | ICD-10-CM | POA: Diagnosis not present

## 2015-10-19 DIAGNOSIS — E119 Type 2 diabetes mellitus without complications: Secondary | ICD-10-CM | POA: Diagnosis not present

## 2015-10-19 DIAGNOSIS — N183 Chronic kidney disease, stage 3 (moderate): Secondary | ICD-10-CM | POA: Diagnosis not present

## 2015-10-19 DIAGNOSIS — S81801D Unspecified open wound, right lower leg, subsequent encounter: Secondary | ICD-10-CM | POA: Diagnosis not present

## 2015-10-19 DIAGNOSIS — Z794 Long term (current) use of insulin: Secondary | ICD-10-CM | POA: Diagnosis not present

## 2015-10-22 DIAGNOSIS — S81801D Unspecified open wound, right lower leg, subsequent encounter: Secondary | ICD-10-CM | POA: Diagnosis not present

## 2015-10-22 DIAGNOSIS — S81802D Unspecified open wound, left lower leg, subsequent encounter: Secondary | ICD-10-CM | POA: Diagnosis not present

## 2015-10-22 DIAGNOSIS — I129 Hypertensive chronic kidney disease with stage 1 through stage 4 chronic kidney disease, or unspecified chronic kidney disease: Secondary | ICD-10-CM | POA: Diagnosis not present

## 2015-10-22 DIAGNOSIS — I5040 Unspecified combined systolic (congestive) and diastolic (congestive) heart failure: Secondary | ICD-10-CM | POA: Diagnosis not present

## 2015-10-22 DIAGNOSIS — Z794 Long term (current) use of insulin: Secondary | ICD-10-CM | POA: Diagnosis not present

## 2015-10-22 DIAGNOSIS — E119 Type 2 diabetes mellitus without complications: Secondary | ICD-10-CM | POA: Diagnosis not present

## 2015-10-22 DIAGNOSIS — D649 Anemia, unspecified: Secondary | ICD-10-CM | POA: Diagnosis not present

## 2015-10-22 DIAGNOSIS — I2510 Atherosclerotic heart disease of native coronary artery without angina pectoris: Secondary | ICD-10-CM | POA: Diagnosis not present

## 2015-10-22 DIAGNOSIS — N183 Chronic kidney disease, stage 3 (moderate): Secondary | ICD-10-CM | POA: Diagnosis not present

## 2015-10-22 DIAGNOSIS — E785 Hyperlipidemia, unspecified: Secondary | ICD-10-CM | POA: Diagnosis not present

## 2015-10-26 DIAGNOSIS — S81802D Unspecified open wound, left lower leg, subsequent encounter: Secondary | ICD-10-CM | POA: Diagnosis not present

## 2015-10-26 DIAGNOSIS — I129 Hypertensive chronic kidney disease with stage 1 through stage 4 chronic kidney disease, or unspecified chronic kidney disease: Secondary | ICD-10-CM | POA: Diagnosis not present

## 2015-10-26 DIAGNOSIS — E785 Hyperlipidemia, unspecified: Secondary | ICD-10-CM | POA: Diagnosis not present

## 2015-10-26 DIAGNOSIS — D649 Anemia, unspecified: Secondary | ICD-10-CM | POA: Diagnosis not present

## 2015-10-26 DIAGNOSIS — E119 Type 2 diabetes mellitus without complications: Secondary | ICD-10-CM | POA: Diagnosis not present

## 2015-10-26 DIAGNOSIS — S81801D Unspecified open wound, right lower leg, subsequent encounter: Secondary | ICD-10-CM | POA: Diagnosis not present

## 2015-10-26 DIAGNOSIS — N183 Chronic kidney disease, stage 3 (moderate): Secondary | ICD-10-CM | POA: Diagnosis not present

## 2015-10-26 DIAGNOSIS — Z794 Long term (current) use of insulin: Secondary | ICD-10-CM | POA: Diagnosis not present

## 2015-10-26 DIAGNOSIS — I5040 Unspecified combined systolic (congestive) and diastolic (congestive) heart failure: Secondary | ICD-10-CM | POA: Diagnosis not present

## 2015-10-26 DIAGNOSIS — I2510 Atherosclerotic heart disease of native coronary artery without angina pectoris: Secondary | ICD-10-CM | POA: Diagnosis not present

## 2015-10-28 DIAGNOSIS — E119 Type 2 diabetes mellitus without complications: Secondary | ICD-10-CM | POA: Diagnosis not present

## 2015-10-28 DIAGNOSIS — I129 Hypertensive chronic kidney disease with stage 1 through stage 4 chronic kidney disease, or unspecified chronic kidney disease: Secondary | ICD-10-CM | POA: Diagnosis not present

## 2015-10-28 DIAGNOSIS — I5040 Unspecified combined systolic (congestive) and diastolic (congestive) heart failure: Secondary | ICD-10-CM | POA: Diagnosis not present

## 2015-10-28 DIAGNOSIS — I2510 Atherosclerotic heart disease of native coronary artery without angina pectoris: Secondary | ICD-10-CM | POA: Diagnosis not present

## 2015-10-28 DIAGNOSIS — E785 Hyperlipidemia, unspecified: Secondary | ICD-10-CM | POA: Diagnosis not present

## 2015-10-28 DIAGNOSIS — N183 Chronic kidney disease, stage 3 (moderate): Secondary | ICD-10-CM | POA: Diagnosis not present

## 2015-10-28 DIAGNOSIS — S81802D Unspecified open wound, left lower leg, subsequent encounter: Secondary | ICD-10-CM | POA: Diagnosis not present

## 2015-10-28 DIAGNOSIS — D649 Anemia, unspecified: Secondary | ICD-10-CM | POA: Diagnosis not present

## 2015-10-28 DIAGNOSIS — S81801D Unspecified open wound, right lower leg, subsequent encounter: Secondary | ICD-10-CM | POA: Diagnosis not present

## 2015-10-28 DIAGNOSIS — Z794 Long term (current) use of insulin: Secondary | ICD-10-CM | POA: Diagnosis not present

## 2015-11-05 DIAGNOSIS — E559 Vitamin D deficiency, unspecified: Secondary | ICD-10-CM | POA: Diagnosis not present

## 2015-11-05 DIAGNOSIS — I10 Essential (primary) hypertension: Secondary | ICD-10-CM | POA: Diagnosis not present

## 2015-11-12 DIAGNOSIS — E559 Vitamin D deficiency, unspecified: Secondary | ICD-10-CM | POA: Diagnosis not present

## 2015-11-12 DIAGNOSIS — I10 Essential (primary) hypertension: Secondary | ICD-10-CM | POA: Diagnosis not present

## 2015-11-26 DIAGNOSIS — I10 Essential (primary) hypertension: Secondary | ICD-10-CM | POA: Diagnosis not present

## 2015-11-26 DIAGNOSIS — Z720 Tobacco use: Secondary | ICD-10-CM | POA: Diagnosis not present

## 2015-11-26 DIAGNOSIS — E785 Hyperlipidemia, unspecified: Secondary | ICD-10-CM | POA: Diagnosis not present

## 2015-11-26 DIAGNOSIS — E119 Type 2 diabetes mellitus without complications: Secondary | ICD-10-CM | POA: Diagnosis not present

## 2015-11-26 DIAGNOSIS — N289 Disorder of kidney and ureter, unspecified: Secondary | ICD-10-CM | POA: Diagnosis not present

## 2015-12-14 DIAGNOSIS — I10 Essential (primary) hypertension: Secondary | ICD-10-CM | POA: Diagnosis not present

## 2015-12-14 DIAGNOSIS — I272 Other secondary pulmonary hypertension: Secondary | ICD-10-CM | POA: Diagnosis not present

## 2015-12-14 DIAGNOSIS — I509 Heart failure, unspecified: Secondary | ICD-10-CM | POA: Diagnosis not present

## 2015-12-14 DIAGNOSIS — N184 Chronic kidney disease, stage 4 (severe): Secondary | ICD-10-CM | POA: Diagnosis not present

## 2015-12-17 DIAGNOSIS — E559 Vitamin D deficiency, unspecified: Secondary | ICD-10-CM | POA: Diagnosis not present

## 2015-12-17 DIAGNOSIS — I10 Essential (primary) hypertension: Secondary | ICD-10-CM | POA: Diagnosis not present

## 2015-12-18 ENCOUNTER — Encounter (HOSPITAL_COMMUNITY): Payer: Self-pay

## 2015-12-18 ENCOUNTER — Observation Stay (HOSPITAL_COMMUNITY)
Admission: EM | Admit: 2015-12-18 | Discharge: 2015-12-22 | Disposition: A | Discharge status: Home / Self Care | Payer: Medicare Other | Attending: Internal Medicine | Admitting: Internal Medicine

## 2015-12-18 DIAGNOSIS — Z7982 Long term (current) use of aspirin: Secondary | ICD-10-CM | POA: Insufficient documentation

## 2015-12-18 DIAGNOSIS — N189 Chronic kidney disease, unspecified: Secondary | ICD-10-CM

## 2015-12-18 DIAGNOSIS — N184 Chronic kidney disease, stage 4 (severe): Secondary | ICD-10-CM | POA: Insufficient documentation

## 2015-12-18 DIAGNOSIS — Z66 Do not resuscitate: Secondary | ICD-10-CM | POA: Diagnosis not present

## 2015-12-18 DIAGNOSIS — I2510 Atherosclerotic heart disease of native coronary artery without angina pectoris: Secondary | ICD-10-CM | POA: Diagnosis present

## 2015-12-18 DIAGNOSIS — IMO0001 Reserved for inherently not codable concepts without codable children: Secondary | ICD-10-CM

## 2015-12-18 DIAGNOSIS — E785 Hyperlipidemia, unspecified: Secondary | ICD-10-CM | POA: Insufficient documentation

## 2015-12-18 DIAGNOSIS — I5042 Chronic combined systolic (congestive) and diastolic (congestive) heart failure: Secondary | ICD-10-CM | POA: Diagnosis not present

## 2015-12-18 DIAGNOSIS — I130 Hypertensive heart and chronic kidney disease with heart failure and stage 1 through stage 4 chronic kidney disease, or unspecified chronic kidney disease: Secondary | ICD-10-CM | POA: Diagnosis not present

## 2015-12-18 DIAGNOSIS — I10 Essential (primary) hypertension: Secondary | ICD-10-CM | POA: Diagnosis present

## 2015-12-18 DIAGNOSIS — D62 Acute posthemorrhagic anemia: Secondary | ICD-10-CM | POA: Diagnosis not present

## 2015-12-18 DIAGNOSIS — I429 Cardiomyopathy, unspecified: Secondary | ICD-10-CM | POA: Insufficient documentation

## 2015-12-18 DIAGNOSIS — K922 Gastrointestinal hemorrhage, unspecified: Principal | ICD-10-CM | POA: Diagnosis present

## 2015-12-18 DIAGNOSIS — D631 Anemia in chronic kidney disease: Secondary | ICD-10-CM | POA: Diagnosis not present

## 2015-12-18 DIAGNOSIS — I152 Hypertension secondary to endocrine disorders: Secondary | ICD-10-CM | POA: Diagnosis present

## 2015-12-18 DIAGNOSIS — R030 Elevated blood-pressure reading, without diagnosis of hypertension: Secondary | ICD-10-CM | POA: Diagnosis not present

## 2015-12-18 DIAGNOSIS — Z794 Long term (current) use of insulin: Secondary | ICD-10-CM | POA: Diagnosis not present

## 2015-12-18 DIAGNOSIS — E1169 Type 2 diabetes mellitus with other specified complication: Secondary | ICD-10-CM | POA: Diagnosis present

## 2015-12-18 DIAGNOSIS — R1031 Right lower quadrant pain: Secondary | ICD-10-CM | POA: Diagnosis not present

## 2015-12-18 DIAGNOSIS — E1122 Type 2 diabetes mellitus with diabetic chronic kidney disease: Secondary | ICD-10-CM | POA: Diagnosis not present

## 2015-12-18 DIAGNOSIS — E669 Obesity, unspecified: Secondary | ICD-10-CM | POA: Diagnosis not present

## 2015-12-18 DIAGNOSIS — Z6830 Body mass index (BMI) 30.0-30.9, adult: Secondary | ICD-10-CM | POA: Insufficient documentation

## 2015-12-18 DIAGNOSIS — E1159 Type 2 diabetes mellitus with other circulatory complications: Secondary | ICD-10-CM | POA: Diagnosis present

## 2015-12-18 DIAGNOSIS — E119 Type 2 diabetes mellitus without complications: Secondary | ICD-10-CM

## 2015-12-18 DIAGNOSIS — N186 End stage renal disease: Secondary | ICD-10-CM | POA: Diagnosis present

## 2015-12-18 DIAGNOSIS — Z992 Dependence on renal dialysis: Secondary | ICD-10-CM

## 2015-12-18 LAB — COMPREHENSIVE METABOLIC PANEL
ALBUMIN: 3.1 g/dL — AB (ref 3.5–5.0)
ALT: 10 U/L — ABNORMAL LOW (ref 14–54)
ANION GAP: 16 — AB (ref 5–15)
AST: 16 U/L (ref 15–41)
Alkaline Phosphatase: 59 U/L (ref 38–126)
BILIRUBIN TOTAL: 0.5 mg/dL (ref 0.3–1.2)
BUN: 100 mg/dL — ABNORMAL HIGH (ref 6–20)
CO2: 26 mmol/L (ref 22–32)
Calcium: 9.2 mg/dL (ref 8.9–10.3)
Chloride: 98 mmol/L — ABNORMAL LOW (ref 101–111)
Creatinine, Ser: 3.51 mg/dL — ABNORMAL HIGH (ref 0.44–1.00)
GFR, EST AFRICAN AMERICAN: 14 mL/min — AB (ref >60)
GFR, EST NON AFRICAN AMERICAN: 12 mL/min — AB (ref >60)
Glucose, Bld: 122 mg/dL — ABNORMAL HIGH (ref 65–99)
POTASSIUM: 4.2 mmol/L (ref 3.5–5.1)
Sodium: 140 mmol/L (ref 135–145)
TOTAL PROTEIN: 6.8 g/dL (ref 6.5–8.1)

## 2015-12-18 LAB — CBC
HEMATOCRIT: 30.7 % — AB (ref 36.0–46.0)
HEMOGLOBIN: 9.5 g/dL — AB (ref 12.0–15.0)
MCH: 27.6 pg (ref 26.0–34.0)
MCHC: 30.9 g/dL (ref 30.0–36.0)
MCV: 89.2 fL (ref 78.0–100.0)
Platelets: 218 10*3/uL (ref 150–400)
RBC: 3.44 MIL/uL — AB (ref 3.87–5.11)
RDW: 14.6 % (ref 11.5–15.5)
WBC: 5.3 10*3/uL (ref 4.0–10.5)

## 2015-12-18 LAB — PROTIME-INR
INR: 1.36 (ref 0.00–1.49)
Prothrombin Time: 16.9 seconds — ABNORMAL HIGH (ref 11.6–15.2)

## 2015-12-18 LAB — HEMATOCRIT: HCT: 29.9 % — ABNORMAL LOW (ref 36.0–46.0)

## 2015-12-18 LAB — HEMOGLOBIN: Hemoglobin: 9.1 g/dL — ABNORMAL LOW (ref 12.0–15.0)

## 2015-12-18 LAB — CBG MONITORING, ED: GLUCOSE-CAPILLARY: 152 mg/dL — AB (ref 65–99)

## 2015-12-18 LAB — PREPARE RBC (CROSSMATCH)

## 2015-12-18 LAB — APTT: aPTT: 29 seconds (ref 24–37)

## 2015-12-18 LAB — ABO/RH: ABO/RH(D): O POS

## 2015-12-18 MED ORDER — PANTOPRAZOLE SODIUM 40 MG IV SOLR
40.0000 mg | INTRAVENOUS | Status: DC
  Administered 2015-12-18 – 2015-12-20 (×3): 40 mg via INTRAVENOUS
  Filled 2015-12-18 (×3): qty 40

## 2015-12-18 MED ORDER — CALCIUM ACETATE (PHOS BINDER) 667 MG PO CAPS
667.0000 mg | ORAL_CAPSULE | Freq: Three times a day (TID) | ORAL | Status: DC
  Administered 2015-12-19 – 2015-12-22 (×11): 667 mg via ORAL
  Filled 2015-12-18 (×11): qty 1

## 2015-12-18 MED ORDER — FUROSEMIDE 10 MG/ML IJ SOLN
20.0000 mg | Freq: Once | INTRAMUSCULAR | Status: AC
  Administered 2015-12-18: 20 mg via INTRAVENOUS
  Filled 2015-12-18: qty 2

## 2015-12-18 MED ORDER — ONDANSETRON HCL 4 MG/2ML IJ SOLN
4.0000 mg | Freq: Four times a day (QID) | INTRAMUSCULAR | Status: DC | PRN
  Filled 2015-12-18: qty 2

## 2015-12-18 MED ORDER — SIMVASTATIN 40 MG PO TABS
40.0000 mg | ORAL_TABLET | Freq: Every day | ORAL | Status: DC
  Administered 2015-12-19 – 2015-12-21 (×3): 40 mg via ORAL
  Filled 2015-12-18 (×3): qty 1

## 2015-12-18 MED ORDER — SODIUM CHLORIDE 0.9% FLUSH: 3.0000 mL | INTRAVENOUS | Status: DC | PRN

## 2015-12-18 MED ORDER — SODIUM CHLORIDE 0.9% FLUSH
3.0000 mL | Freq: Two times a day (BID) | INTRAVENOUS | Status: DC
  Administered 2015-12-18 – 2015-12-19 (×2): 3 mL via INTRAVENOUS

## 2015-12-18 MED ORDER — INSULIN DETEMIR 100 UNIT/ML ~~LOC~~ SOLN
8.0000 [IU] | Freq: Every day | SUBCUTANEOUS | Status: DC
  Administered 2015-12-19 – 2015-12-21 (×4): 8 [IU] via SUBCUTANEOUS
  Filled 2015-12-18 (×5): qty 0.08

## 2015-12-18 MED ORDER — INSULIN ASPART 100 UNIT/ML ~~LOC~~ SOLN
0.0000 [IU] | Freq: Three times a day (TID) | SUBCUTANEOUS | Status: DC
  Administered 2015-12-19: 2 [IU] via SUBCUTANEOUS
  Administered 2015-12-19 – 2015-12-20 (×2): 1 [IU] via SUBCUTANEOUS
  Administered 2015-12-20: 3 [IU] via SUBCUTANEOUS
  Administered 2015-12-20: 2 [IU] via SUBCUTANEOUS
  Administered 2015-12-21: 5 [IU] via SUBCUTANEOUS
  Administered 2015-12-22: 3 [IU] via SUBCUTANEOUS

## 2015-12-18 MED ORDER — SODIUM CHLORIDE 0.9 % IV SOLN: 250.0000 mL | INTRAVENOUS | Status: DC | PRN

## 2015-12-18 MED ORDER — ONDANSETRON HCL 4 MG PO TABS: 4.0000 mg | ORAL_TABLET | Freq: Four times a day (QID) | ORAL | Status: DC | PRN

## 2015-12-18 MED ORDER — ACETAMINOPHEN 650 MG RE SUPP: 650.0000 mg | Freq: Four times a day (QID) | RECTAL | Status: DC | PRN

## 2015-12-18 MED ORDER — SODIUM CHLORIDE 0.9% FLUSH
3.0000 mL | Freq: Two times a day (BID) | INTRAVENOUS | Status: DC
  Administered 2015-12-18 – 2015-12-22 (×7): 3 mL via INTRAVENOUS

## 2015-12-18 MED ORDER — BRIMONIDINE TARTRATE-TIMOLOL 0.2-0.5 % OP SOLN: 1.0000 [drp] | Freq: Two times a day (BID) | OPHTHALMIC | Status: DC | PRN

## 2015-12-18 MED ORDER — ACETAMINOPHEN 325 MG PO TABS: 650.0000 mg | ORAL_TABLET | Freq: Four times a day (QID) | ORAL | Status: DC | PRN

## 2015-12-18 MED ORDER — SODIUM CHLORIDE 0.9 % IV SOLN
Freq: Once | INTRAVENOUS | Status: AC
  Administered 2015-12-18: 22:00:00 via INTRAVENOUS

## 2015-12-18 MED ORDER — HYDROCODONE-ACETAMINOPHEN 5-325 MG PO TABS
1.0000 | ORAL_TABLET | ORAL | Status: DC | PRN
  Administered 2015-12-20: 2 via ORAL
  Filled 2015-12-18: qty 2

## 2015-12-18 NOTE — ED Notes (Signed)
Patient comes from home via EMS with complaints of a rectal bleed that happened earlier this evening.  States she had a bowel movement of mostly bright red blood.  Pain is in the lower abdomen, denies any rebound pain.  No headache notes.  Does not have history of rectal bleeds.

## 2015-12-18 NOTE — ED Notes (Signed)
Pt called out and stated that "I am sitting in a pool of blood" This RN in room, pt laying in pool of blood. Stool noted in blood laying in bed. Pt cleaned, sheets replaced by this RN, Aaron Edelman RN, and Yvone Neu EMT. Pt alert and oriented x 4. Dr Ayesha Rumpf notified and in room to assess.

## 2015-12-18 NOTE — H&P (Signed)
History and Physical    Kathleen [name] [id number] DOB: Dec 31, 1945 DOA: 12/18/2015  PCP: Benito Mccreedy, MD   Patient coming from: Home  Chief Complaint: Bloody BM   HPI: Kathleen [name] is a 70 y.o. female with medical history significant for coronary artery disease with medical management advised, chronic combined systolic/diastolic CHF, chronic kidney disease stage IV, insulin-dependent diabetes mellitus, and hypertension who presents to the ED with 4 days of lower abdominal cramps and a grossly bloody bowel movement just prior to arrival. Patient reports being in her usual state of health until the insidious onset of lower abdominal cramping on 12/14/2015. She denies abdominal pain per se, but notes intermittent crampy discomfort in the lower quadrants. She is unable to identify any alleviating or exacerbating factors for this. There has been no recent long distance travel or sick contacts and the patient has not been nauseous, experienced reflux, or vomiting. Patient has never had similar symptoms previously and reports a colonoscopy in the past 5-10 years that she recalls being "normal." Patient is not anticoagulated but takes a daily aspirin 81 mg. She denies any alcohol use, or use of NSAIDs. She experienced recurrent lower abdominal cramps this evening prior to her presentation which was followed by an urgent need to have a bowel movement. When she got to the restroom, she noted blood in her underwear and then had a large bowel movement with bright red blood and darker clots. She endorses some associated lightheadedness, but denies syncope, chest pain, palpitations, or dyspnea.  ED Course: Upon arrival to the ED, patient is found to be afebrile, saturating well on room air, and with vital signs stable. CMP features a serum creatinine of 3.51, up from an apparent baseline of approximately 3.2. BUN is elevated to 100. CBC features a hemoglobin of 9.5, up from 8.5 in January of this year.  Type and screen was performed in the emergency department and patient experienced a recurrent bloody bowel movement while in the ED which was observed by nursing and described as bright red with clots. Patient remains hemodynamically stable in the emergency department and will be admitted to the hospital for ongoing evaluation and management of acute GI bleed.  Review of Systems:  All other systems reviewed and apart from HPI, are negative.  Past Medical History  Diagnosis Date  . Diabetes mellitus without complication ([place])   . CKD (chronic kidney disease), stage III     a. Borderline III-IV.  [name] Hypertension   . CAD (coronary artery disease)     a. Cath 01/2014: mild in LAD, mild-mod LCx, mod-severe RCA - for med rx.  . Systolic CHF ([place])     a. 01/2014: dx with mixed ICM/NICM (out of proportion to CAD) EF 25-30% by echo.   . Hyperlipidemia   . Anemia     a. 01/2014: suspected of chronic disease, negative FOBT.  [name] Meningioma ([place])     a. Incidental dx 01/2014.  . Obesity   . History of echocardiogram     Echo (10/15):  Mod LVH, EF 50-55%, Gr 1 DD, MAC, mild MR, mild TR, PASP 35 mmHg    Past Surgical History  Procedure Laterality Date  . Abdominal hysterectomy    . Breast surgery    . Tonsillectomy    . Left heart catheterization with coronary angiogram N/A 01/27/2014    Procedure: LEFT HEART CATHETERIZATION WITH CORONARY ANGIOGRAM;  Surgeon: Jettie Booze, MD;  Location: [facility] CATH LAB;  Service: Cardiovascular;  Laterality: N/A;  reports that she has quit smoking. Her smoking use included Cigarettes. She has a 7.5 pack-year smoking history. She has never used smokeless tobacco. She reports that she does not drink alcohol or use illicit drugs.  No Known Allergies  Family History  Problem Relation Age of Onset  . Diabetic kidney disease Mother   . Hypertension Mother   . Hypertension Father   . Heart failure Mother   . Heart attack Mother   . Hyperlipidemia Mother       Prior to Admission medications   Medication Sig Start Date End Date Taking? Authorizing Provider  aspirin 81 MG EC tablet Take 1 tablet (81 mg total) by mouth daily. 01/30/14  Yes Verlee Monte, MD  brimonidine-timolol (COMBIGAN) 0.2-0.5 % ophthalmic solution Place 1 drop into the right eye 2 (two) times daily as needed (for glaucoma).    Yes Historical Provider, MD  calcium acetate (PHOSLO) 667 MG capsule Take 667 mg by mouth 3 (three) times daily with meals.   Yes Historical Provider, MD  carvedilol (COREG) 12.5 MG tablet Take 12.5 mg by mouth 2 (two) times daily with a meal.  09/30/14  Yes Historical Provider, MD  furosemide (LASIX) 80 MG tablet Take 2 tablets (160 mg total) by mouth 2 (two) times daily. Patient taking differently: Take 80 mg by mouth daily.  08/14/15  Yes Orson Eva, MD  hydrALAZINE (APRESOLINE) 50 MG tablet TAKE 1 TABLET (50 MG TOTAL) BY MOUTH 3 (THREE) TIMES DAILY. 07/28/15  Yes Josue Hector, MD  insulin aspart (NOVOLOG) 100 UNIT/ML injection Inject 0-9 Units into the skin 3 (three) times daily with meals. CBG < 70: implement hypoglycemia protocol CBG 70 - 120: 0 units CBG 121 - 150: 1 unit CBG 151 - 200: 2 units CBG 201 - 250: 3 units CBG 251 - 300: 5 units CBG 301 - 350: 7 units CBG 351 - 400: 9 units CBG > 400: call MD. Patient taking differently: Inject 6 Units into the skin 3 (three) times daily with meals. CBG < 70: implement hypoglycemia protocol CBG 70 - 120: 0 units CBG 121 - 150: 1 unit CBG 151 - 200: 2 units CBG 201 - 250: 3 units CBG 251 - 300: 5 units CBG 301 - 350: 7 units CBG 351 - 400: 9 units CBG > 400: call MD. 06/12/14  Yes Modena Jansky, MD  insulin detemir (LEVEMIR) 100 UNIT/ML injection Inject 0.1 mLs (10 Units total) into the skin at bedtime. 06/12/14  Yes Modena Jansky, MD  isosorbide mononitrate (IMDUR) 30 MG 24 hr tablet TAKE 1 TABLET (30 MG TOTAL) BY MOUTH DAILY. 11/07/14  Yes Josue Hector, MD  meclizine (ANTIVERT) 12.5 MG tablet  Take 1 tablet (12.5 mg total) by mouth 3 (three) times daily as needed for dizziness or nausea. Patient taking differently: Take 12.5 mg by mouth 3 (three) times daily as needed for dizziness or nausea. For dizzy spells 01/30/14  Yes Verlee Monte, MD  simvastatin (ZOCOR) 40 MG tablet Take 40 mg by mouth daily at 6 PM.   Yes Historical Provider, MD  atorvastatin (LIPITOR) 20 MG tablet Take 1 tablet (20 mg total) by mouth daily at 6 PM. 08/14/15   Orson Eva, MD  Nutritional Supplements (FEEDING SUPPLEMENT, NEPRO CARB STEADY,) LIQD Take 237 mLs by mouth 2 (two) times daily between meals. 08/14/15   Orson Eva, MD    Physical Exam: Filed Vitals:   12/18/15 1903  BP: 156/90  Pulse: 88  Temp:  98 F (36.7 C)  Resp: 18  Height: 5\' 4"  (1.626 m)  Weight: 76.204 kg (168 lb)  SpO2: 97%      Constitutional: NAD, calm, comfortable Eyes: PERTLA, lids and conjunctivae normal ENMT: Mucous membranes are moist. Posterior pharynx clear of any exudate or lesions.   Neck: normal, supple, no masses, no thyromegaly Respiratory: clear to auscultation bilaterally, no wheezing, no crackles. Normal respiratory effort.   Cardiovascular: S1 & S2 heard, regular rate and rhythm, no significant murmurs / rubs / gallops. 2+ pedal pulses. No significant JVD. Abdomen: No distension, no tenderness, no masses palpated. Bowel sounds normal.  Musculoskeletal: no clubbing / cyanosis. No joint deformity upper and lower extremities. Normal muscle tone.  Skin: no significant rashes, lesions, ulcers. Warm, dry, well-perfused. Neurologic: CN 2-12 grossly intact. Sensation intact, DTR normal. Strength 5/5 in all 4 limbs.  Psychiatric: Normal judgment and insight. Alert and oriented x 3. Normal mood and affect.     Labs on Admission: I have personally reviewed following labs and imaging studies  CBC:  Recent Labs Lab 12/18/15 1910  WBC 5.3  HGB 9.5*  HCT 30.7*  MCV 89.2  PLT 99991111   Basic Metabolic Panel:  Recent  Labs Lab 12/18/15 1910  NA 140  K 4.2  CL 98*  CO2 26  GLUCOSE 122*  BUN 100*  CREATININE 3.51*  CALCIUM 9.2   GFR: Estimated Creatinine Clearance: 14.7 mL/min (by C-G formula based on Cr of 3.51). Liver Function Tests:  Recent Labs Lab 12/18/15 1910  AST 16  ALT 10*  ALKPHOS 59  BILITOT 0.5  PROT 6.8  ALBUMIN 3.1*   No results for input(s): LIPASE, AMYLASE in the last 168 hours. No results for input(s): AMMONIA in the last 168 hours. Coagulation Profile: No results for input(s): INR, PROTIME in the last 168 hours. Cardiac Enzymes: No results for input(s): CKTOTAL, CKMB, CKMBINDEX, TROPONINI in the last 168 hours. BNP (last 3 results) No results for input(s): PROBNP in the last 8760 hours. HbA1C: No results for input(s): HGBA1C in the last 72 hours. CBG: No results for input(s): GLUCAP in the last 168 hours. Lipid Profile: No results for input(s): CHOL, HDL, LDLCALC, TRIG, CHOLHDL, LDLDIRECT in the last 72 hours. Thyroid Function Tests: No results for input(s): TSH, T4TOTAL, FREET4, T3FREE, THYROIDAB in the last 72 hours. Anemia Panel: No results for input(s): VITAMINB12, FOLATE, FERRITIN, TIBC, IRON, RETICCTPCT in the last 72 hours. Urine analysis:    Component Value Date/Time   COLORURINE YELLOW 08/05/2015 1548   APPEARANCEUR HAZY* 08/05/2015 1548   LABSPEC 1.020 08/05/2015 1548   PHURINE 5.0 08/05/2015 1548   GLUCOSEU NEGATIVE 08/05/2015 1548   HGBUR NEGATIVE 08/05/2015 1548   [place] 08/05/2015 1548   KETONESUR NEGATIVE 08/05/2015 1548   PROTEINUR >300* 08/05/2015 1548   UROBILINOGEN 0.2 06/11/2014 0639   NITRITE NEGATIVE 08/05/2015 1548   LEUKOCYTESUR NEGATIVE 08/05/2015 1548   Sepsis Labs: @LABRCNTIP (procalcitonin:4,lacticidven:4) )No results found for this or any previous visit (from the past 240 hour(s)).   Radiological Exams on Admission: No results found.  EKG:  Not performed, will obtain as appropriate.    Assessment/Plan  1. Acute GI bleed  - Presents following episode of hematochezia at home after several days of cramping in lower quadrants  - BRBPR with clots observed in ED  - Initial Hgb 9.5, then 9.1; this is consistent with her apparent baseline  - Taking ASA 81 mg daily, will hold  - No hx of liver disease or  GIB  - Sxs are suggestive of LGIB, likely diverticular vs AVM; of note, BUN is 100 (SCr 3.51), possibly indicative of UGI source   - Type and screen performed; blood bank asked to prepare and hold 2 units  - Monitor on telemetry with serial H/H    2. CKD stage IV - SCr 3.51 on admission; baseline appears to be ~3.2  - BUN is 100 on admission; suspect this is reflective of dehydration, though may suggest an upper GI source for her bleed  - Suspect her SCr will improve a little with volume replacement, holding Lasix  - Repeat chem panel in am   - Avoid nephrotoxins where possible  3. Chronic combined systolic/diastolic CHF  - TTE (99991111) with EF A999333, grade 1 diastolic dysfunction, mild MR, mod TR  - Managed with Coreg, Lasix at home  - Holding Lasix and Coreg for now given active bleed and concern for ensuing hypotension - Follow strict I/Os, daily wts  - Resume Lasix and Coreg as tolerated  4. Hypertension - At goal currently  - Using Coreg, Lasix, hydralazine at home  - Holding home antihypertensives for now given concern for hemodynamic compromise in setting of acute GIB  - Resume home antihypertensives as appropriate   5. Type II DM  - Managed with Levemir 10 units qHS and Novolog 6 units TID with meals at home  - A1c was 7.3% in December 2016, reflecting sub-optimal control at that time  - Continue with reduced-dose basal and moderate-intensity SSI while in hospital  - Check CBG with meals and qHS, adjust insulins prn  - Update A1c, ordered    6. Hyperlipidemia  - LDL 156, HDL 43 in December 2016  - Continue current-dose Zocor    7. Anemia  - Hgb  9.5 on admission, up from 8.1 in January 2017  - Suspect the chronic anemia is secondary to CKD  - Trending H/H with blood on hold as above    DVT prophylaxis: SCD  Code Status: DNR  Family Communication: Son at bedside Disposition Plan: Observe in stepdown   Admission status: Observation    Vianne Bulls, MD [facility] Pager [phone]  If 7PM-7AM, please contact night-coverage [email or web address] Password [facility]  12/18/2015, 9:57 PM

## 2015-12-18 NOTE — ED Provider Notes (Signed)
CSN: [id number]     Arrival date & time 12/18/15  1854 History   First MD Initiated Contact with Patient 12/18/15 1911     Chief Complaint  Patient presents with  . Rectal Bleeding     Patient is a 70 y.o. female presenting with hematochezia. The history is provided by the patient. No language interpreter was used.  Rectal Bleeding  Andreya CEAIRA TAMBURRO is a 70 y.o. female who presents to the Emergency Department complaining of rectal bleeding.  About 430pm today she developed lower abdominal cramping and had a large bloody bowel movement.  She reports ongoing lower abdominal cramping and generalized malaise/nausea.  No fevers, chest pain, vomiting.  No prior similar sxs. No hx/o GI bleed.  She has a remote hx/o blood transfusion following uterine rupture. Sxs are severe and constant.    Past Medical History  Diagnosis Date  . Diabetes mellitus without complication ([place])   . CKD (chronic kidney disease), stage III     a. Borderline III-IV.  [name] Hypertension   . CAD (coronary artery disease)     a. Cath 01/2014: mild in LAD, mild-mod LCx, mod-severe RCA - for med rx.  . Systolic CHF ([place])     a. 01/2014: dx with mixed ICM/NICM (out of proportion to CAD) EF 25-30% by echo.   . Hyperlipidemia   . Anemia     a. 01/2014: suspected of chronic disease, negative FOBT.  [name] Meningioma ([place])     a. Incidental dx 01/2014.  . Obesity   . History of echocardiogram     Echo (10/15):  Mod LVH, EF 50-55%, Gr 1 DD, MAC, mild MR, mild TR, PASP 35 mmHg   Past Surgical History  Procedure Laterality Date  . Abdominal hysterectomy    . Breast surgery    . Tonsillectomy    . Left heart catheterization with coronary angiogram N/A 01/27/2014    Procedure: LEFT HEART CATHETERIZATION WITH CORONARY ANGIOGRAM;  Surgeon: Jettie Booze, MD;  Location: [facility] CATH LAB;  Service: Cardiovascular;  Laterality: N/A;   Family History  Problem Relation Age of Onset  . Diabetic kidney disease Mother   . Hypertension Mother    . Hypertension Father   . Heart failure Mother   . Heart attack Mother   . Hyperlipidemia Mother    Social History  Substance Use Topics  . Smoking status: Former Smoker -- 0.25 packs/day for 30 years    Types: Cigarettes  . Smokeless tobacco: Never Used  . Alcohol Use: No   OB History    No data available     Review of Systems  Gastrointestinal: Positive for hematochezia.  All other systems reviewed and are negative.     Allergies  Review of patient's allergies indicates no known allergies.  Home Medications   Prior to Admission medications   Medication Sig Start Date End Date Taking? Authorizing Provider  aspirin 81 MG EC tablet Take 1 tablet (81 mg total) by mouth daily. 01/30/14  Yes Verlee Monte, MD  brimonidine-timolol (COMBIGAN) 0.2-0.5 % ophthalmic solution Place 1 drop into the right eye 2 (two) times daily as needed (for glaucoma).    Yes Historical Provider, MD  calcium acetate (PHOSLO) 667 MG capsule Take 667 mg by mouth 3 (three) times daily with meals.   Yes Historical Provider, MD  carvedilol (COREG) 12.5 MG tablet Take 12.5 mg by mouth 2 (two) times daily with a meal.  09/30/14  Yes Historical Provider, MD  furosemide (LASIX) 80  MG tablet Take 2 tablets (160 mg total) by mouth 2 (two) times daily. Patient taking differently: Take 80 mg by mouth daily.  08/14/15  Yes Orson Eva, MD  hydrALAZINE (APRESOLINE) 50 MG tablet TAKE 1 TABLET (50 MG TOTAL) BY MOUTH 3 (THREE) TIMES DAILY. 07/28/15  Yes Josue Hector, MD  insulin aspart (NOVOLOG) 100 UNIT/ML injection Inject 0-9 Units into the skin 3 (three) times daily with meals. CBG < 70: implement hypoglycemia protocol CBG 70 - 120: 0 units CBG 121 - 150: 1 unit CBG 151 - 200: 2 units CBG 201 - 250: 3 units CBG 251 - 300: 5 units CBG 301 - 350: 7 units CBG 351 - 400: 9 units CBG > 400: call MD. Patient taking differently: Inject 6 Units into the skin 3 (three) times daily with meals. CBG < 70: implement  hypoglycemia protocol CBG 70 - 120: 0 units CBG 121 - 150: 1 unit CBG 151 - 200: 2 units CBG 201 - 250: 3 units CBG 251 - 300: 5 units CBG 301 - 350: 7 units CBG 351 - 400: 9 units CBG > 400: call MD. 06/12/14  Yes Modena Jansky, MD  insulin detemir (LEVEMIR) 100 UNIT/ML injection Inject 0.1 mLs (10 Units total) into the skin at bedtime. 06/12/14  Yes Modena Jansky, MD  isosorbide mononitrate (IMDUR) 30 MG 24 hr tablet TAKE 1 TABLET (30 MG TOTAL) BY MOUTH DAILY. 11/07/14  Yes Josue Hector, MD  meclizine (ANTIVERT) 12.5 MG tablet Take 1 tablet (12.5 mg total) by mouth 3 (three) times daily as needed for dizziness or nausea. Patient taking differently: Take 12.5 mg by mouth 3 (three) times daily as needed for dizziness or nausea. For dizzy spells 01/30/14  Yes Verlee Monte, MD  simvastatin (ZOCOR) 40 MG tablet Take 40 mg by mouth daily at 6 PM.   Yes Historical Provider, MD  atorvastatin (LIPITOR) 20 MG tablet Take 1 tablet (20 mg total) by mouth daily at 6 PM. 08/14/15   Orson Eva, MD  Nutritional Supplements (FEEDING SUPPLEMENT, NEPRO CARB STEADY,) LIQD Take 237 mLs by mouth 2 (two) times daily between meals. 08/14/15   Orson Eva, MD   BP 121/79 mmHg  Pulse 86  Temp(Src) 98 F (36.7 C)  Resp 20  Ht 5\' 4"  (1.626 m)  Wt 168 lb (76.204 kg)  BMI 28.82 kg/m2  SpO2 97% Physical Exam  Constitutional: She is oriented to person, place, and time. She appears well-developed and well-nourished.  HENT:  Head: Normocephalic and atraumatic.  Cardiovascular: Normal rate and regular rhythm.   No murmur heard. Pulmonary/Chest: Effort normal and breath sounds normal. No respiratory distress.  Abdominal: Soft. There is no rebound and no guarding.  Mild lower abdominal tenderness  Genitourinary:  Large amount of clotted blood on bed with small amt of stool mixed in.    Musculoskeletal: She exhibits no edema or tenderness.  Neurological: She is alert and oriented to person, place, and time.  Skin:  Skin is warm and dry.  Psychiatric: She has a normal mood and affect. Her behavior is normal.  Nursing note and vitals reviewed.   ED Course  Procedures (including critical care time) Labs Review Labs Reviewed  COMPREHENSIVE METABOLIC PANEL - Abnormal; Notable for the following:    Chloride 98 (*)    Glucose, Bld 122 (*)    BUN 100 (*)    Creatinine, Ser 3.51 (*)    Albumin 3.1 (*)    ALT 10 (*)  GFR calc non Af Amer 12 (*)    GFR calc Af Amer 14 (*)    Anion gap 16 (*)    All other components within normal limits  CBC - Abnormal; Notable for the following:    RBC 3.44 (*)    Hemoglobin 9.5 (*)    HCT 30.7 (*)    All other components within normal limits  PROTIME-INR - Abnormal; Notable for the following:    Prothrombin Time 16.9 (*)    All other components within normal limits  HEMOGLOBIN - Abnormal; Notable for the following:    Hemoglobin 9.1 (*)    All other components within normal limits  HEMATOCRIT - Abnormal; Notable for the following:    HCT 29.9 (*)    All other components within normal limits  CBG MONITORING, ED - Abnormal; Notable for the following:    Glucose-Capillary 152 (*)    All other components within normal limits  APTT  BASIC METABOLIC PANEL  HEMOGLOBIN  HEMOGLOBIN  HEMOGLOBIN  HEMATOCRIT  HEMATOCRIT  HEMATOCRIT  POC OCCULT BLOOD, ED  TYPE AND SCREEN  ABO/RH  PREPARE RBC (CROSSMATCH)    Imaging Review No results found. I have personally reviewed and evaluated these images and lab results as part of my medical decision-making.   EKG Interpretation None      MDM   Final diagnoses:  Acute lower GI bleeding    Pt here for evaluation of hematochezia, significant bleeding present on ED arrival.  She is currently hemodynamically stable.  Type and cross ordered but will not transfuse at this time.  Plan to admit for observation for significant lower GI bleed.  Hospitalist consulted for admission.  Pt updated of findings of studies and  recommendation for admission for further treatment.      Quintella Reichert, MD 12/18/15 [phone]

## 2015-12-19 DIAGNOSIS — K922 Gastrointestinal hemorrhage, unspecified: Secondary | ICD-10-CM

## 2015-12-19 LAB — BASIC METABOLIC PANEL
Anion gap: 14 (ref 5–15)
BUN: 103 mg/dL — AB (ref 6–20)
CALCIUM: 9 mg/dL (ref 8.9–10.3)
CHLORIDE: 102 mmol/L (ref 101–111)
CO2: 23 mmol/L (ref 22–32)
CREATININE: 3.62 mg/dL — AB (ref 0.44–1.00)
GFR calc Af Amer: 14 mL/min — ABNORMAL LOW (ref >60)
GFR calc non Af Amer: 12 mL/min — ABNORMAL LOW (ref >60)
Glucose, Bld: 172 mg/dL — ABNORMAL HIGH (ref 65–99)
Potassium: 4.3 mmol/L (ref 3.5–5.1)
Sodium: 139 mmol/L (ref 135–145)

## 2015-12-19 LAB — GLUCOSE, CAPILLARY
GLUCOSE-CAPILLARY: 193 mg/dL — AB (ref 65–99)
Glucose-Capillary: 127 mg/dL — ABNORMAL HIGH (ref 65–99)
Glucose-Capillary: 155 mg/dL — ABNORMAL HIGH (ref 65–99)

## 2015-12-19 LAB — CBC
HCT: 28.9 % — ABNORMAL LOW (ref 36.0–46.0)
Hemoglobin: 8.9 g/dL — ABNORMAL LOW (ref 12.0–15.0)
MCH: 28.1 pg (ref 26.0–34.0)
MCHC: 30.8 g/dL (ref 30.0–36.0)
MCV: 91.2 fL (ref 78.0–100.0)
PLATELETS: 195 10*3/uL (ref 150–400)
RBC: 3.17 MIL/uL — ABNORMAL LOW (ref 3.87–5.11)
RDW: 14.6 % (ref 11.5–15.5)
WBC: 4.5 10*3/uL (ref 4.0–10.5)

## 2015-12-19 LAB — HEMOGLOBIN
Hemoglobin: 8.5 g/dL — ABNORMAL LOW (ref 12.0–15.0)
Hemoglobin: 9.3 g/dL — ABNORMAL LOW (ref 12.0–15.0)
Hemoglobin: 8.4 g/dL — ABNORMAL LOW (ref 12.0–15.0)

## 2015-12-19 LAB — MRSA PCR SCREENING: MRSA BY PCR: NEGATIVE

## 2015-12-19 LAB — HEMATOCRIT
HCT: 27.6 % — ABNORMAL LOW (ref 36.0–46.0)
HEMATOCRIT: 29.8 % — AB (ref 36.0–46.0)
HEMATOCRIT: 27.1 % — AB (ref 36.0–46.0)

## 2015-12-19 LAB — CBG MONITORING, ED: GLUCOSE-CAPILLARY: 117 mg/dL — AB (ref 65–99)

## 2015-12-19 MED ORDER — SODIUM CHLORIDE 0.9 % IV SOLN: Freq: Once | INTRAVENOUS | Status: DC

## 2015-12-19 MED ORDER — SODIUM CHLORIDE 0.9 % IV SOLN
INTRAVENOUS | Status: DC
  Administered 2015-12-19 – 2015-12-21 (×3): via INTRAVENOUS

## 2015-12-19 MED ORDER — TIMOLOL MALEATE 0.5 % OP SOLN
1.0000 [drp] | Freq: Two times a day (BID) | OPHTHALMIC | Status: DC
  Administered 2015-12-19 – 2015-12-22 (×6): 1 [drp] via OPHTHALMIC
  Filled 2015-12-19: qty 5

## 2015-12-19 MED ORDER — BRIMONIDINE TARTRATE 0.2 % OP SOLN
1.0000 [drp] | Freq: Two times a day (BID) | OPHTHALMIC | Status: DC
  Administered 2015-12-19 – 2015-12-22 (×6): 1 [drp] via OPHTHALMIC
  Filled 2015-12-19 (×2): qty 5

## 2015-12-19 NOTE — ED Notes (Signed)
Faxed breakfast order @ [phone].

## 2015-12-19 NOTE — ED Notes (Signed)
Notified MRI that pt is cleared to go.

## 2015-12-19 NOTE — ED Notes (Signed)
Pt's brief changed after pt urinated. Pt cleaned and changed by this RN 2 EMT's. Pt repositioned and comfortable in bed. Pt alert and oriented x 4 and NAD.

## 2015-12-19 NOTE — ED Notes (Signed)
Pts CBG 117. Pt remains monitored by blood pressure, pulse ox, and 12 lead.

## 2015-12-19 NOTE — Progress Notes (Addendum)
[place] TEAM 1 - Stepdown/ICU TEAM  Kathleen [name]  [id number] DOB: 1945/12/14 DOA: 12/18/2015 PCP: Benito Mccreedy, MD    Brief Narrative:  70 y.o. female with history of coronary artery disease with medical management, chronic combined systolic/diastolic CHF, chronic kidney disease stage IV, insulin-dependent diabetes mellitus, and hypertension who presented to the ED with 4 days of lower abdominal cramps and a grossly bloody bowel movement just prior to arrival. Patient reported being in her usual state of health until the insidious onset of lower abdominal cramping on 12/14/2015. She denied abdominal pain per se, but noted intermittent crampy discomfort in the lower quadrants. Patient had never had similar symptoms previously and reported a "normal" colonoscopy in the past 5-10 years.  She has an urgent large bowel movement during which she noted blood in her underwear and bright red blood with darker clots in the stool.  In the ED CMP featured a serum creatinine of 3.51, up from an apparent baseline of approximately 3.2. BUN was elevated to 100. CBC noted a hemoglobin of 9.5, up from 8.5 in January of this year. The patient experienced a recurrent bloody bowel movement while in the ED which was described by nursing as bright red with clots.   Assessment & Plan:  Acute GI bleed  - BRBPR with clots observed in ED suggestive of LGIB, likely diverticular vs AVM, though BUN was 100 - no recurrence since stay in ED - follow clinically   Acute blood loss Anemia + anemia of CKD  - Hgb 8.1 in January 2017   Recent Labs Lab 12/18/15 1910 12/18/15 2247 12/19/15 0210 12/19/15 0558 12/19/15 1000  HGB 9.5* 9.1* 8.5* 9.3* 8.4*    CKD stage IV - SCr 3.51 on admission; baseline appears to be ~3.2  - pt does not desire HD, even if it means changing to comfort care only  Recent Labs Lab 12/18/15 1910 12/19/15 0200  CREATININE 3.51* 3.62*    Chronic combined systolic & diastolic  CHF  - TTE (99991111) with EF A999333, grade 1 diastolic dysfunction, mild MR, mod TR - no evidence of volume overload at this time - baseline wgt appears to be ~93kg as of Jan 2017 - pt w/ dramatic wgt loss since Jan Filed Weights   12/18/15 1903 12/19/15 1208  Weight: 76.204 kg (168 lb) 75.3 kg (166 lb 0.1 oz)    Hypertension -BP well controlled at this time  DM2 -CBG well controlled at this time   Hyperlipidemia  - LDL 156, HDL 43 in December 2016   DVT prophylaxis: SCDs Code Status: NO CODE BLUE / DNR  Family Communication: no family present at time of exam  Disposition Plan: SDU  Consultants:  none  Procedures:  none  Antimicrobials:  none   Subjective: The patient denies having a bowel movement since moving from the ED.  She denies chest pain shortness breath fevers or chills.  She states she feels just fine.  Objective: Blood pressure 119/58, pulse 83, temperature 98 F (36.7 C), resp. rate 14, height 5\' 4"  (1.626 m), weight 76.204 kg (168 lb), SpO2 96 %.  Intake/Output Summary (Last 24 hours) at 12/19/15 0912 Last data filed at 12/19/15 0645  Gross per 24 hour  Intake      0 ml  Output    500 ml  Net   -500 ml   Filed Weights   12/18/15 1903  Weight: 76.204 kg (168 lb)    Examination: General: No acute respiratory distress Lungs: Clear  to auscultation bilaterally without wheezes or crackles Cardiovascular: Regular rate and rhythm without murmur gallop or rub normal S1 and S2 Abdomen: Nontender, nondistended, soft, bowel sounds positive, no rebound, no ascites, no appreciable mass Extremities: No significant cyanosis, clubbing, or edema bilateral lower extremities  CBC:  Recent Labs Lab 12/18/15 1910 12/18/15 2247 12/19/15 0210 12/19/15 0558  WBC 5.3  --   --   --   HGB 9.5* 9.1* 8.5* 9.3*  HCT 30.7* 29.9* 27.6* 29.8*  MCV 89.2  --   --   --   PLT 218  --   --   --    Basic Metabolic Panel:  Recent Labs Lab 12/18/15 1910  12/19/15 0200  NA 140 139  K 4.2 4.3  CL 98* 102  CO2 26 23  GLUCOSE 122* 172*  BUN 100* 103*  CREATININE 3.51* 3.62*  CALCIUM 9.2 9.0   GFR: Estimated Creatinine Clearance: 14.2 mL/min (by C-G formula based on Cr of 3.62).  Liver Function Tests:  Recent Labs Lab 12/18/15 1910  AST 16  ALT 10*  ALKPHOS 59  BILITOT 0.5  PROT 6.8  ALBUMIN 3.1*    Coagulation Profile:  Recent Labs Lab 12/18/15 2247  INR 1.36   HbA1C: HGB A1C MFR BLD  Date/Time Value Ref Range Status  08/06/2015 11:40 AM 7.3* 4.8 - 5.6 % Final    Comment:    (NOTE)         Pre-diabetes: 5.7 - 6.4         Diabetes: >6.4         Glycemic control for adults with diabetes: <7.0   08/05/2015 10:00 PM 7.4* 4.8 - 5.6 % Final    Comment:    (NOTE)         Pre-diabetes: 5.7 - 6.4         Diabetes: >6.4         Glycemic control for adults with diabetes: <7.0     CBG:  Recent Labs Lab 12/18/15 2226 12/19/15 0829  GLUCAP 152* 117*    Thyroid Function Tests: TSH  Date/Time Value Ref Range Status  06/10/2014 08:20 PM 0.801 0.350 - 4.500 uIU/mL Final    Urine analysis:    Component Value Date/Time   COLORURINE YELLOW 08/05/2015 1548   APPEARANCEUR HAZY* 08/05/2015 1548   LABSPEC 1.020 08/05/2015 1548   PHURINE 5.0 08/05/2015 [place] 08/05/2015 [place] 08/05/2015 [place] 08/05/2015 [place] 08/05/2015 1548   PROTEINUR >300* 08/05/2015 1548   UROBILINOGEN 0.2 06/11/2014 0639   NITRITE NEGATIVE 08/05/2015 1548   LEUKOCYTESUR NEGATIVE 08/05/2015 1548    Scheduled Meds: . brimonidine  1 drop Right Eye BID   And  . timolol  1 drop Right Eye BID  . calcium acetate  667 mg Oral TID WC  . insulin aspart  0-9 Units Subcutaneous TID WC  . insulin detemir  8 Units Subcutaneous QHS  . pantoprazole (PROTONIX) IV  40 mg Intravenous Q24H  . simvastatin  40 mg Oral q1800  . sodium chloride flush  3 mL Intravenous Q12H  .  sodium chloride flush  3 mL Intravenous Q12H   Continuous Infusions: . sodium chloride      Time spent: 35 minutes   Cherene Altes, MD [facility] Office  [phone] Pager - Text Page per Shea Evans as per below:  On-Call/Text Page:      [email or web address]  password [email or web address]  If 7PM-7AM, please contact night-coverage [email or web address] Password [email or web address] 12/19/2015, 9:12 AM

## 2015-12-19 NOTE — Progress Notes (Signed)
No mention of request for cardiology   calll if needed  Will remove from our list

## 2015-12-19 NOTE — ED Notes (Signed)
Pt remains monitored by blood pressure, pulse ox, and 5 lead.  

## 2015-12-20 DIAGNOSIS — K922 Gastrointestinal hemorrhage, unspecified: Secondary | ICD-10-CM | POA: Diagnosis not present

## 2015-12-20 LAB — COMPREHENSIVE METABOLIC PANEL
ALK PHOS: 52 U/L (ref 38–126)
ALT: 11 U/L — AB (ref 14–54)
AST: 17 U/L (ref 15–41)
Albumin: 2.9 g/dL — ABNORMAL LOW (ref 3.5–5.0)
Anion gap: 11 (ref 5–15)
BILIRUBIN TOTAL: 0.4 mg/dL (ref 0.3–1.2)
BUN: 90 mg/dL — AB (ref 6–20)
CHLORIDE: 101 mmol/L (ref 101–111)
CO2: 25 mmol/L (ref 22–32)
CREATININE: 3.43 mg/dL — AB (ref 0.44–1.00)
Calcium: 8.8 mg/dL — ABNORMAL LOW (ref 8.9–10.3)
GFR calc Af Amer: 14 mL/min — ABNORMAL LOW (ref >60)
GFR, EST NON AFRICAN AMERICAN: 12 mL/min — AB (ref >60)
Glucose, Bld: 107 mg/dL — ABNORMAL HIGH (ref 65–99)
Potassium: 4.4 mmol/L (ref 3.5–5.1)
Sodium: 137 mmol/L (ref 135–145)
Total Protein: 6.5 g/dL (ref 6.5–8.1)

## 2015-12-20 LAB — GLUCOSE, CAPILLARY
GLUCOSE-CAPILLARY: 133 mg/dL — AB (ref 65–99)
GLUCOSE-CAPILLARY: 174 mg/dL — AB (ref 65–99)
GLUCOSE-CAPILLARY: 144 mg/dL — AB (ref 65–99)
Glucose-Capillary: 183 mg/dL — ABNORMAL HIGH (ref 65–99)
Glucose-Capillary: 201 mg/dL — ABNORMAL HIGH (ref 65–99)

## 2015-12-20 LAB — CBC
HEMATOCRIT: 27.4 % — AB (ref 36.0–46.0)
HEMOGLOBIN: 8.4 g/dL — AB (ref 12.0–15.0)
MCH: 27.8 pg (ref 26.0–34.0)
MCHC: 30.7 g/dL (ref 30.0–36.0)
MCV: 90.7 fL (ref 78.0–100.0)
Platelets: 209 10*3/uL (ref 150–400)
RBC: 3.02 MIL/uL — AB (ref 3.87–5.11)
RDW: 14.6 % (ref 11.5–15.5)
WBC: 4.6 10*3/uL (ref 4.0–10.5)

## 2015-12-20 MED ORDER — ZOLPIDEM TARTRATE 5 MG PO TABS
5.0000 mg | ORAL_TABLET | Freq: Every evening | ORAL | Status: DC | PRN
  Administered 2015-12-21 (×2): 5 mg via ORAL
  Filled 2015-12-20 (×2): qty 1

## 2015-12-20 MED ORDER — CARVEDILOL 12.5 MG PO TABS
12.5000 mg | ORAL_TABLET | Freq: Two times a day (BID) | ORAL | Status: DC
  Administered 2015-12-20 – 2015-12-22 (×4): 12.5 mg via ORAL
  Filled 2015-12-20 (×4): qty 1

## 2015-12-20 NOTE — Progress Notes (Signed)
[place] TEAM 1 - Stepdown/ICU TEAM  Kathleen [name]  [id number] DOB: [birth year] DOA: 12/18/2015 PCP: Benito Mccreedy, MD    Brief Narrative:  70 y.o. female with history of coronary artery disease with medical management, chronic combined systolic/diastolic CHF, chronic kidney disease stage IV, insulin-dependent diabetes mellitus, and hypertension who presented to the ED with 4 days of lower abdominal cramps and a grossly bloody bowel movement just prior to arrival. Patient reported being in her usual state of health until the insidious onset of lower abdominal cramping on 12/14/2015. She denied abdominal pain per se, but noted intermittent crampy discomfort in the lower quadrants. Patient had never had similar symptoms previously and reported a "normal" colonoscopy in the past 5-10 years.  She has an urgent large bowel movement during which she noted blood in her underwear and bright red blood with darker clots in the stool.  In the ED CMP featured a serum creatinine of 3.51, up from an apparent baseline of approximately 3.2. BUN was elevated to 100. CBC noted a hemoglobin of 9.5, up from 8.5 in January of this year. The patient experienced a recurrent bloody bowel movement while in the ED which was described by nursing as bright red with clots.   Assessment & Plan:  Acute GI bleed  - BRBPR with clots observed in ED suggestive of LGIB, likely diverticular vs AVM, though BUN was 100 - no recurrence since admit - follow clinically for now   Acute blood loss Anemia + anemia of CKD  - Hgb 8.1 in January 2017 - despite reported bleeding Hgb is stable at present - follow   Recent Labs Lab 12/18/15 2247 12/19/15 0210 12/19/15 0558 12/19/15 1000 12/19/15 1826 12/20/15 0356  HGB 9.1* 8.5* 9.3* 8.4* 8.9* 8.4*    CKD stage IV - SCr 3.51 on admission; baseline appears to be ~3.2  - pt does not desire HD, even if it means changing to comfort care only - keep euvolemic and follow crt  trend   Recent Labs Lab 12/18/15 1910 12/19/15 0200 12/20/15 0356  CREATININE 3.51* 3.62* 3.43*    Chronic combined systolic & diastolic CHF  - TTE (99991111) with EF A999333, grade 1 diastolic dysfunction, mild MR, mod TR - no evidence of volume overload at this time - baseline wgt appears to be ~93kg as of Jan 2017 - pt w/ apparent dramatic wgt loss since Jan Filed Weights   12/18/15 1903 12/19/15 1208 12/20/15 0515  Weight: 76.204 kg (168 lb) 75.3 kg (166 lb 0.1 oz) 75.796 kg (167 lb 1.6 oz)    Hypertension -BP well controlled   DM2 -CBG well controlled   Hyperlipidemia  - LDL 156, HDL 43 in December 2016   DVT prophylaxis: SCDs Code Status: NO CODE BLUE / DNR  Family Communication: no family present at time of exam  Disposition Plan: SDU  Consultants:  none  Procedures:  none  Antimicrobials:  none   Subjective: The pt is resting comfortably.  She denies cp, n/v, or abdom pain.  She is hungry.    Objective: Blood pressure 166/76, pulse 75, temperature 97.4 F (36.3 C), temperature source Oral, resp. rate 12, height 5\' 4"  (1.626 m), weight 75.796 kg (167 lb 1.6 oz), SpO2 95 %.  Intake/Output Summary (Last 24 hours) at 12/20/15 1152 Last data filed at 12/20/15 0932  Gross per 24 hour  Intake   2084 ml  Output   1575 ml  Net    509 ml   [employer]  12/18/15 1903 12/19/15 1208 12/20/15 0515  Weight: 76.204 kg (168 lb) 75.3 kg (166 lb 0.1 oz) 75.796 kg (167 lb 1.6 oz)    Examination: General: No acute respiratory distress - resting comfortably in bed  Lungs: Clear to auscultation bilaterally - no crackles Cardiovascular: Regular rate and rhythm without murmur gallop or rub Abdomen: Nontender, nondistended, soft, bowel sounds positive, no rebound, no ascites, no appreciable mass Extremities: No significant cyanosis, clubbing, edema bilateral lower extremities  CBC:  Recent Labs Lab 12/18/15 1910  12/19/15 0210 12/19/15 0558 12/19/15 1000  12/19/15 1826 12/20/15 0356  WBC 5.3  --   --   --   --  4.5 4.6  HGB 9.5*  < > 8.5* 9.3* 8.4* 8.9* 8.4*  HCT 30.7*  < > 27.6* 29.8* 27.1* 28.9* 27.4*  MCV 89.2  --   --   --   --  91.2 90.7  PLT 218  --   --   --   --  195 209  < > = values in this interval not displayed.   Basic Metabolic Panel:  Recent Labs Lab 12/18/15 1910 12/19/15 0200 12/20/15 0356  NA 140 139 137  K 4.2 4.3 4.4  CL 98* 102 101  CO2 26 23 25   GLUCOSE 122* 172* 107*  BUN 100* 103* 90*  CREATININE 3.51* 3.62* 3.43*  CALCIUM 9.2 9.0 8.8*   GFR: Estimated Creatinine Clearance: 15 mL/min (by C-G formula based on Cr of 3.43).  Liver Function Tests:  Recent Labs Lab 12/18/15 1910 12/20/15 0356  AST 16 17  ALT 10* 11*  ALKPHOS 59 52  BILITOT 0.5 0.4  PROT 6.8 6.5  ALBUMIN 3.1* 2.9*    Coagulation Profile:  Recent Labs Lab 12/18/15 2247  INR 1.36   HbA1C: HGB A1C MFR BLD  Date/Time Value Ref Range Status  08/06/2015 11:40 AM 7.3* 4.8 - 5.6 % Final    Comment:    (NOTE)         Pre-diabetes: 5.7 - 6.4         Diabetes: >6.4         Glycemic control for adults with diabetes: <7.0   08/05/2015 10:00 PM 7.4* 4.8 - 5.6 % Final    Comment:    (NOTE)         Pre-diabetes: 5.7 - 6.4         Diabetes: >6.4         Glycemic control for adults with diabetes: <7.0     CBG:  Recent Labs Lab 12/19/15 0829 12/19/15 1304 12/19/15 1724 12/19/15 2105 12/20/15 0807  GLUCAP 117* 193* 127* 155* 133*    Thyroid Function Tests: TSH  Date/Time Value Ref Range Status  06/10/2014 08:20 PM 0.801 0.350 - 4.500 uIU/mL Final    Urine analysis:    Component Value Date/Time   COLORURINE YELLOW 08/05/2015 1548   APPEARANCEUR HAZY* 08/05/2015 1548   LABSPEC 1.020 08/05/2015 1548   PHURINE 5.0 08/05/2015 [place] 08/05/2015 [place] 08/05/2015 [place] 08/05/2015 [place] 08/05/2015 1548   PROTEINUR >300* 08/05/2015 1548    UROBILINOGEN 0.2 06/11/2014 0639   NITRITE NEGATIVE 08/05/2015 1548   LEUKOCYTESUR NEGATIVE 08/05/2015 1548    Scheduled Meds: . brimonidine  1 drop Right Eye BID   And  . timolol  1 drop Right Eye BID  . calcium acetate  667 mg Oral TID WC  . insulin aspart  0-9 Units Subcutaneous TID WC  . insulin detemir  8 Units Subcutaneous QHS  . pantoprazole (PROTONIX) IV  40 mg Intravenous Q24H  . simvastatin  40 mg Oral q1800  . sodium chloride flush  3 mL Intravenous Q12H   Continuous Infusions: . sodium chloride 60 mL/hr at 12/20/15 0950    Time spent: 35 minutes   Cherene Altes, MD [facility] Office  [phone] Pager - Text Page per Amion as per below:  On-Call/Text Page:      [email or web address]      password [email or web address]  If 7PM-7AM, please contact night-coverage [email or web address] Password [email or web address] 12/20/2015, 11:52 AM

## 2015-12-21 DIAGNOSIS — K922 Gastrointestinal hemorrhage, unspecified: Secondary | ICD-10-CM | POA: Diagnosis not present

## 2015-12-21 LAB — RENAL FUNCTION PANEL
ALBUMIN: 2.7 g/dL — AB (ref 3.5–5.0)
ANION GAP: 8 (ref 5–15)
BUN: 83 mg/dL — AB (ref 6–20)
CO2: 23 mmol/L (ref 22–32)
Calcium: 8.3 mg/dL — ABNORMAL LOW (ref 8.9–10.3)
Chloride: 106 mmol/L (ref 101–111)
Creatinine, Ser: 3.14 mg/dL — ABNORMAL HIGH (ref 0.44–1.00)
GFR calc Af Amer: 16 mL/min — ABNORMAL LOW (ref >60)
GFR calc non Af Amer: 14 mL/min — ABNORMAL LOW (ref >60)
GLUCOSE: 160 mg/dL — AB (ref 65–99)
POTASSIUM: 4.8 mmol/L (ref 3.5–5.1)
Phosphorus: 4.7 mg/dL — ABNORMAL HIGH (ref 2.5–4.6)
SODIUM: 137 mmol/L (ref 135–145)

## 2015-12-21 LAB — CBC
HEMATOCRIT: 25.8 % — AB (ref 36.0–46.0)
HEMOGLOBIN: 8.3 g/dL — AB (ref 12.0–15.0)
MCH: 29.1 pg (ref 26.0–34.0)
MCHC: 32.2 g/dL (ref 30.0–36.0)
MCV: 90.5 fL (ref 78.0–100.0)
Platelets: 178 10*3/uL (ref 150–400)
RBC: 2.85 MIL/uL — ABNORMAL LOW (ref 3.87–5.11)
RDW: 14.2 % (ref 11.5–15.5)
WBC: 4.2 10*3/uL (ref 4.0–10.5)

## 2015-12-21 LAB — GLUCOSE, CAPILLARY
GLUCOSE-CAPILLARY: 118 mg/dL — AB (ref 65–99)
GLUCOSE-CAPILLARY: 147 mg/dL — AB (ref 65–99)
Glucose-Capillary: 72 mg/dL (ref 65–99)
Glucose-Capillary: 264 mg/dL — ABNORMAL HIGH (ref 65–99)

## 2015-12-21 MED ORDER — ISOSORBIDE MONONITRATE ER 30 MG PO TB24
30.0000 mg | ORAL_TABLET | Freq: Every day | ORAL | Status: DC
  Administered 2015-12-21 – 2015-12-22 (×2): 30 mg via ORAL
  Filled 2015-12-21 (×2): qty 1

## 2015-12-21 MED ORDER — POLYETHYLENE GLYCOL 3350 17 G PO PACK
17.0000 g | PACK | Freq: Every day | ORAL | Status: DC
  Administered 2015-12-21: 17 g via ORAL
  Filled 2015-12-21 (×2): qty 1

## 2015-12-21 MED ORDER — FUROSEMIDE 80 MG PO TABS
80.0000 mg | ORAL_TABLET | Freq: Every day | ORAL | Status: DC
  Administered 2015-12-21 – 2015-12-22 (×2): 80 mg via ORAL
  Filled 2015-12-21 (×2): qty 1

## 2015-12-21 MED ORDER — DIATRIZOATE MEGLUMINE & SODIUM 66-10 % PO SOLN
30.0000 mL | ORAL | Status: DC
  Administered 2015-12-21: 30 mL via ORAL
  Filled 2015-12-21: qty 30

## 2015-12-21 MED ORDER — SENNOSIDES-DOCUSATE SODIUM 8.6-50 MG PO TABS
1.0000 | ORAL_TABLET | Freq: Two times a day (BID) | ORAL | Status: DC
  Administered 2015-12-21 – 2015-12-22 (×2): 1 via ORAL
  Filled 2015-12-21 (×2): qty 1

## 2015-12-21 MED ORDER — PANTOPRAZOLE SODIUM 40 MG PO TBEC
40.0000 mg | DELAYED_RELEASE_TABLET | Freq: Every day | ORAL | Status: DC
Start: 2015-12-21 — End: 2015-12-22
  Administered 2015-12-21 – 2015-12-22 (×2): 40 mg via ORAL
  Filled 2015-12-21 (×2): qty 1

## 2015-12-21 MED ORDER — BISACODYL 10 MG RE SUPP: 10.0000 mg | Freq: Every day | RECTAL | Status: DC | PRN

## 2015-12-21 NOTE — Evaluation (Signed)
Physical Therapy Evaluation Patient Details Name: Kathleen [name] MRN: [id number] DOB: 07-29-1946 Today's Date: 12/21/2015   History of Present Illness  70 y.o. female with history of coronary artery disease with medical management, chronic combined systolic/diastolic CHF, chronic kidney disease stage IV, insulin-dependent diabetes mellitus, and HTN who presented to the ED with 4 days of lower abdominal cramps and a grossly bloody bowel movement just prior to arrival  Clinical Impression  Patient demonstrates deficits in functional mobility as indicated below. Will benefit from continued skilled PT to address deficits and maximize function. Will see as indicated and progress as tolerated. At this time, recommending patient use RW upon discharge and HHPT.    Follow Up Recommendations Home health PT;Supervision - Intermittent    Equipment Recommendations  None recommended by PT    Recommendations for Other Services       Precautions / Restrictions Precautions Precautions: Fall Restrictions Weight Bearing Restrictions: No      Mobility  Bed Mobility Overal bed mobility: Needs Assistance Bed Mobility: Supine to Sit     Supine to sit: Supervision     General bed mobility comments: increased time to perform, no physical assist required  Transfers Overall transfer level: Needs assistance Equipment used: Rolling walker (2 wheeled) Transfers: Sit to/from [facility] Sit to Stand: Min assist Stand pivot transfers: Min assist (stand pivot with HHA to BSC and back to EOB before amb)       General transfer comment: Min assist for stability with transfer, VCs for hand placement and positioing  Ambulation/Gait Ambulation/Gait assistance: Min guard Ambulation Distance (Feet): 80 Feet Assistive device: Rolling walker (2 wheeled) Gait Pattern/deviations: Step-through pattern;Decreased stride length;Shuffle;Antalgic;Trunk flexed Gait velocity: decreased Gait  velocity interpretation: <1.8 ft/sec, indicative of risk for recurrent falls General Gait Details: modest instability noted with fatigue, pain in RLE from arthritic changes  Stairs            Wheelchair Mobility    Modified Rankin (Stroke Patients Only)       Balance Overall balance assessment: Needs assistance Sitting-balance support: Feet supported Sitting balance-Leahy Scale: Fair     Standing balance support: During functional activity Standing balance-Leahy Scale: Poor Standing balance comment: required UE support for dynamic functional tasks and hygiene /pericare                             Pertinent Vitals/Pain      Home Living Family/patient expects to be discharged to:: Private residence Living Arrangements: Children Available Help at Discharge: Family Type of Home: House Home Access: Stairs to enter Entrance Stairs-Rails: Left Entrance Stairs-Number of Steps: 2 Home Layout: One level Home Equipment: Environmental consultant - 2 wheels;Cane - single point      Prior Function Level of Independence: Independent with assistive device(s)         Comments: used a cane for mobility     Hand Dominance   Dominant Hand: Right    Extremity/Trunk Assessment   Upper Extremity Assessment: Generalized weakness           Lower Extremity Assessment: Generalized weakness         Communication   Communication: No difficulties  Cognition Arousal/Alertness: Awake/alert Behavior During Therapy: WFL for tasks assessed/performed Overall Cognitive Status: Within Functional Limits for tasks assessed                      General Comments      Exercises  Assessment/Plan    PT Assessment Patient needs continued PT services  PT Diagnosis Difficulty walking;Generalized weakness   PT Problem List Decreased strength;Decreased activity tolerance;Decreased balance;Decreased mobility;Pain  PT Treatment Interventions DME instruction;Gait  training;Stair training;Functional mobility training;Therapeutic activities;Therapeutic exercise;Balance training;Patient/family education   PT Goals (Current goals can be found in the Care Plan section) Acute Rehab PT Goals Patient Stated Goal: to get stronger PT Goal Formulation: With patient Time For Goal Achievement: 01/04/16 Potential to Achieve Goals: Good    Frequency Min 3X/week   Barriers to discharge        Co-evaluation               End of Session Equipment Utilized During Treatment: Gait belt Activity Tolerance: Patient limited by fatigue Patient left: in chair;with call bell/phone within reach Nurse Communication: Mobility status    Functional Assessment Tool Used: clinical judgement Functional Limitation: Mobility: Walking and moving around Mobility: Walking and Moving Around Current Status [phone]): At least 20 percent but less than 40 percent impaired, limited or restricted Mobility: Walking and Moving Around Goal Status [phone]): At least 1 percent but less than 20 percent impaired, limited or restricted    Time: 1501-1520 PT Time Calculation (min) (ACUTE ONLY): 19 min   Charges:   PT Evaluation $PT Eval Moderate Complexity: 1 Procedure     PT G Codes:   PT G-Codes **NOT FOR INPATIENT CLASS** Functional Assessment Tool Used: clinical judgement Functional Limitation: Mobility: Walking and moving around Mobility: Walking and Moving Around Current Status [phone]): At least 20 percent but less than 40 percent impaired, limited or restricted Mobility: Walking and Moving Around Goal Status [phone]): At least 1 percent but less than 20 percent impaired, limited or restricted    Duncan Dull 12/21/2015, 3:35 PM Alben Deeds, [place] DPT  [phone]

## 2015-12-21 NOTE — Care Management Obs Status (Signed)
[place] NOTIFICATION   Patient Details  Name: Kathleen [name] MRN: [id number] Date of Birth: [birth year]   Medicare Observation Status Notification Given:  Yes    Zenon Mayo, RN 12/21/2015, 10:13 AM

## 2015-12-21 NOTE — Progress Notes (Signed)
[place] TEAM 1 - Stepdown/ICU TEAM  Kathleen [name]  [id number] DOB: 03-31-1946 DOA: 12/18/2015 PCP: Benito Mccreedy, MD    Brief Narrative:  70 y.o. female with history of coronary artery disease with medical management, chronic combined systolic/diastolic CHF, chronic kidney disease stage IV, insulin-dependent diabetes mellitus, and HTN who presented to the ED with 4 days of lower abdominal cramps and a grossly bloody bowel movement just prior to arrival. Patient reported being in her usual state of health until the insidious onset of lower abdominal cramping on 12/14/2015. She denied abdominal pain per se, but noted intermittent crampy discomfort in the lower quadrants. Patient had never had similar symptoms previously and reported a "normal" colonoscopy in the past 5-10 years.  She has an urgent large bowel movement during which she noted blood in her underwear and bright red blood with darker clots in the stool.  In the ED CMP featured a serum creatinine of 3.51, up from an apparent baseline of approximately 3.2. BUN was elevated to 100. CBC noted a hemoglobin of 9.5, up from 8.5 in January of this year. The patient experienced a recurrent bloody bowel movement while in the ED which was described by nursing as bright red with clots.   Assessment & Plan:  Acute GI bleed  - BRBPR with clots observed in ED suggestive of LGIB, likely diverticular vs AVM, though BUN was 100 - no recurrence since admit - no prior CT imaging of abdom - no colo reports available in Epic - will check CT abdom w/ oral contrast only to eval for diverticulosis and to r/o large mass   Acute blood loss Anemia + anemia of CKD  - Hgb 8.1 in January 2017 - Hgb stable    Recent Labs Lab 12/19/15 0210 12/19/15 0558 12/19/15 1000 12/19/15 1826 12/20/15 0356 12/21/15 0214  HGB 8.5* 9.3* 8.4* 8.9* 8.4* 8.3*    CKD stage IV - SCr 3.51 on admission; baseline appears to be ~3.2  - pt does not desire HD, even  if it means changing to comfort care only - crt stable    Recent Labs Lab 12/18/15 1910 12/19/15 0200 12/20/15 0356 12/21/15 0214  CREATININE 3.51* 3.62* 3.43* 3.14*    Chronic combined systolic & diastolic CHF  - TTE (99991111) with EF A999333, grade 1 diastolic dysfunction, mild MR, mod TR - no evidence of volume overload at this time - baseline wgt appears to be ~93kg as of Jan 2017 - pt w/ apparent dramatic wgt loss since Jan Filed Weights   12/19/15 1208 12/20/15 0515 12/21/15 0300  Weight: 75.3 kg (166 lb 0.1 oz) 75.796 kg (167 lb 1.6 oz) 81.4 kg (179 lb 7.3 oz)    Hypertension -BP has trended upward w/ volume expansion - adjust medical tx and follow    DM2 -CBG reasonably well controlled   Hyperlipidemia  - LDL 156, HDL 43 in December 2016   DVT prophylaxis: SCDs Code Status: NO CODE BLUE / DNR  Family Communication: no family present at time of exam  Disposition Plan: stable for transfer to medical bed - PT/OT - follow Hgb - check CT abdom as discussed above - lives in [place] w/ her son   Consultants:  none  Procedures:  none  Antimicrobials:  none   Subjective: No complaints.  Has not had a bowel movement in a couple of days.  Denies cp, sob, or abdom pain.      Objective: Blood pressure 179/67, pulse 86, temperature 98 F (36.7  C), temperature source Oral, resp. rate 16, height 5\' 4"  (1.626 m), weight 81.4 kg (179 lb 7.3 oz), SpO2 96 %.  Intake/Output Summary (Last 24 hours) at 12/21/15 0846 Last data filed at 12/21/15 0833  Gross per 24 hour  Intake   1902 ml  Output   1825 ml  Net     77 ml   Filed Weights   12/19/15 1208 12/20/15 0515 12/21/15 0300  Weight: 75.3 kg (166 lb 0.1 oz) 75.796 kg (167 lb 1.6 oz) 81.4 kg (179 lb 7.3 oz)    Examination: General: No acute respiratory distress Lungs: Clear to auscultation bilaterally - no wheeze or crackles  Cardiovascular: Regular rate and rhythm without murmur Abdomen: Nontender, nondistended,  soft, bowel sounds positive, no rebound, no mass Extremities: No significant cyanosis, clubbing, or edema bilateral lower extremities  CBC:  Recent Labs Lab 12/18/15 1910  12/19/15 0558 12/19/15 1000 12/19/15 1826 12/20/15 0356 12/21/15 0214  WBC 5.3  --   --   --  4.5 4.6 4.2  HGB 9.5*  < > 9.3* 8.4* 8.9* 8.4* 8.3*  HCT 30.7*  < > 29.8* 27.1* 28.9* 27.4* 25.8*  MCV 89.2  --   --   --  91.2 90.7 90.5  PLT 218  --   --   --  195 209 178  < > = values in this interval not displayed.   Basic Metabolic Panel:  Recent Labs Lab 12/18/15 1910 12/19/15 0200 12/20/15 0356 12/21/15 0214  NA 140 139 137 137  K 4.2 4.3 4.4 4.8  CL 98* 102 101 106  CO2 26 23 25 23   GLUCOSE 122* 172* 107* 160*  BUN 100* 103* 90* 83*  CREATININE 3.51* 3.62* 3.43* 3.14*  CALCIUM 9.2 9.0 8.8* 8.3*  PHOS  --   --   --  4.7*   GFR: Estimated Creatinine Clearance: 17 mL/min (by C-G formula based on Cr of 3.14).  Liver Function Tests:  Recent Labs Lab 12/18/15 1910 12/20/15 0356 12/21/15 0214  AST 16 17  --   ALT 10* 11*  --   ALKPHOS 59 52  --   BILITOT 0.5 0.4  --   PROT 6.8 6.5  --   ALBUMIN 3.1* 2.9* 2.7*    Coagulation Profile:  Recent Labs Lab 12/18/15 2247  INR 1.36    CBG:  Recent Labs Lab 12/20/15 1148 12/20/15 1707 12/20/15 1738 12/20/15 2119 12/21/15 0801  GLUCAP 183* 201* 174* 144* 72    Scheduled Meds: . brimonidine  1 drop Right Eye BID   And  . timolol  1 drop Right Eye BID  . calcium acetate  667 mg Oral TID WC  . carvedilol  12.5 mg Oral BID WC  . insulin aspart  0-9 Units Subcutaneous TID WC  . insulin detemir  8 Units Subcutaneous QHS  . pantoprazole (PROTONIX) IV  40 mg Intravenous Q24H  . simvastatin  40 mg Oral q1800  . sodium chloride flush  3 mL Intravenous Q12H   Continuous Infusions: . sodium chloride 40 mL/hr at 12/20/15 1315    Time spent: 35 minutes   Cherene Altes, MD [facility] Office  [phone] Pager - Text  Page per Amion as per below:  On-Call/Text Page:      [email or web address]      password [email or web address]  If 7PM-7AM, please contact night-coverage [email or web address] Password [facility] 12/21/2015, 8:46 AM

## 2015-12-21 NOTE — Care Management Note (Signed)
Case Management Note  Patient Details  Name: Kathleen [name] MRN: [id number] Date of Birth: Jul 29, 1946  Subjective/Objective:   Patient is from home with son,  Presents with GIB, but no bleeding since admit, she uses a cane and a rolling walker.  She has a PCP, Dr. Bernadette Hoit, and she has insurance for her medications. NCM will cont to follow for dc needs.                Action/Plan:   Expected Discharge Date:  12/21/15               Expected Discharge Plan:  [place]  In-House Referral:     Discharge planning Services  CM Consult  Post Acute Care Choice:    Choice offered to:     DME Arranged:    DME Agency:     HH Arranged:    HH Agency:     Status of Service:  In process, will continue to follow  Medicare Important Message Given:    Date Medicare IM Given:    Medicare IM give by:    Date Additional Medicare IM Given:    Additional Medicare Important Message give by:     If discussed at [place] of Stay Meetings, dates discussed:    Additional Comments:  Zenon Mayo, RN 12/21/2015, 10:13 AM

## 2015-12-21 NOTE — Progress Notes (Signed)
Initial Nutrition Assessment  DOCUMENTATION CODES:   Not applicable  INTERVENTION:  Magic Cup BID. Each supplement provides 290 kcals and 9 grams of protein.   NUTRITION DIAGNOSIS:   Inadequate oral intake related to poor appetite as evidenced by per patient/family report.  GOAL:   Patient will meet greater than or equal to 90% of their needs  MONITOR:   PO intake, Supplement acceptance, Labs, Weight trends, Skin, I & O's  REASON FOR ASSESSMENT:   Malnutrition Screening Tool    ASSESSMENT:   Pt with history of coronary artery disease with medical management, chronic combined systolic/diastolic CHF, chronic kidney disease stage IV, insulin-dependent diabetes mellitus, and HTN who presented to the ED with 4 days of lower abdominal cramps and a grossly bloody bowel movement just prior to arrival. Patient reported being in her usual state of health until the insidious onset of lower abdominal cramping on 12/14/2015. She denied abdominal pain per se, but noted intermittent crampy discomfort in the lower quadrants.  Pt reports decreased intake PTA. She reports eating BID.  Pt reports poor appetite and denies N/V.  Pt denies recent, unintentional weight loss. Pt reports losing about 20 lbs about a year ago d/t healthy diet changes.  Pt PO's 0-100%. Pt reports eating well at hospital. Pt does not like the taste of nutrition supplements. Refuses Ensure but amenable to ice cream. Will order Magic Cup BID with meals.    NFPE: no fat depletion, no muscle depletion, no edema.  Labs reviewed; CBGs 72-201, BUN 83, creat 3.14, Ca 8.3, GFR 16, phos 4.7.  Meds reviewed; Lasix 80 mg, miralax, Calcium acetate TID. Diet Order:  Diet regular Room service appropriate?: Yes; Fluid consistency:: Thin  Skin:  Reviewed, no issues  Last BM:  5/13  Height:   Ht Readings from Last 1 Encounters:  12/21/15 5\' 4"  (1.626 m)    Weight:   Wt Readings from Last 1 Encounters:  12/21/15 179 lb 7.3 oz  (81.4 kg)    Ideal Body Weight:  54.5 kg  BMI:  Body mass index is 30.79 kg/(m^2).  Estimated Nutritional Needs:   Kcal:  1400-1600  Protein:  75 g  Fluid:  1.5 L  EDUCATION NEEDS:   No education needs identified at this time  Geoffery Lyons, [place] Dietetic Intern Pager [phone]

## 2015-12-22 ENCOUNTER — Observation Stay (HOSPITAL_COMMUNITY): Payer: Medicare Other

## 2015-12-22 DIAGNOSIS — N200 Calculus of kidney: Secondary | ICD-10-CM | POA: Diagnosis not present

## 2015-12-22 DIAGNOSIS — K922 Gastrointestinal hemorrhage, unspecified: Secondary | ICD-10-CM | POA: Diagnosis not present

## 2015-12-22 LAB — TYPE AND SCREEN
ABO/RH(D): O POS
Antibody Screen: NEGATIVE
UNIT DIVISION: 0
Unit division: 0

## 2015-12-22 LAB — GLUCOSE, CAPILLARY
Glucose-Capillary: 111 mg/dL — ABNORMAL HIGH (ref 65–99)
Glucose-Capillary: 229 mg/dL — ABNORMAL HIGH (ref 65–99)

## 2015-12-22 LAB — COMPREHENSIVE METABOLIC PANEL
ALBUMIN: 2.7 g/dL — AB (ref 3.5–5.0)
ALT: 11 U/L — ABNORMAL LOW (ref 14–54)
ANION GAP: 10 (ref 5–15)
AST: 16 U/L (ref 15–41)
Alkaline Phosphatase: 56 U/L (ref 38–126)
BUN: 79 mg/dL — AB (ref 6–20)
CHLORIDE: 102 mmol/L (ref 101–111)
CO2: 26 mmol/L (ref 22–32)
Calcium: 8.5 mg/dL — ABNORMAL LOW (ref 8.9–10.3)
Creatinine, Ser: 3.21 mg/dL — ABNORMAL HIGH (ref 0.44–1.00)
GFR calc Af Amer: 16 mL/min — ABNORMAL LOW (ref >60)
GFR calc non Af Amer: 14 mL/min — ABNORMAL LOW (ref >60)
GLUCOSE: 128 mg/dL — AB (ref 65–99)
POTASSIUM: 4.8 mmol/L (ref 3.5–5.1)
SODIUM: 138 mmol/L (ref 135–145)
Total Bilirubin: 0.5 mg/dL (ref 0.3–1.2)
Total Protein: 5.8 g/dL — ABNORMAL LOW (ref 6.5–8.1)

## 2015-12-22 LAB — CBC
HEMATOCRIT: 24.1 % — AB (ref 36.0–46.0)
HEMATOCRIT: 28.2 % — AB (ref 36.0–46.0)
Hemoglobin: 7.5 g/dL — ABNORMAL LOW (ref 12.0–15.0)
Hemoglobin: 8.9 g/dL — ABNORMAL LOW (ref 12.0–15.0)
MCH: 27.9 pg (ref 26.0–34.0)
MCH: 27.6 pg (ref 26.0–34.0)
MCHC: 31.1 g/dL (ref 30.0–36.0)
MCHC: 31.6 g/dL (ref 30.0–36.0)
MCV: 89.6 fL (ref 78.0–100.0)
MCV: 87.6 fL (ref 78.0–100.0)
PLATELETS: 183 10*3/uL (ref 150–400)
Platelets: 219 10*3/uL (ref 150–400)
RBC: 2.69 MIL/uL — ABNORMAL LOW (ref 3.87–5.11)
RBC: 3.22 MIL/uL — AB (ref 3.87–5.11)
RDW: 14 % (ref 11.5–15.5)
RDW: 14 % (ref 11.5–15.5)
WBC: 3.9 10*3/uL — AB (ref 4.0–10.5)
WBC: 4.6 10*3/uL (ref 4.0–10.5)

## 2015-12-22 MED ORDER — MECLIZINE HCL 12.5 MG PO TABS: 12.5000 mg | ORAL_TABLET | Freq: Three times a day (TID) | ORAL | Status: DC | PRN

## 2015-12-22 MED ORDER — ASPIRIN 81 MG PO TBEC: 81.0000 mg | DELAYED_RELEASE_TABLET | Freq: Every day | ORAL | Status: DC

## 2015-12-22 MED ORDER — FUROSEMIDE 80 MG PO TABS: 80.0000 mg | ORAL_TABLET | Freq: Every day | ORAL | Status: DC

## 2015-12-22 MED ORDER — INSULIN ASPART 100 UNIT/ML ~~LOC~~ SOLN: 6.0000 [IU] | Freq: Three times a day (TID) | SUBCUTANEOUS | Status: DC

## 2015-12-22 NOTE — Discharge Instructions (Signed)
Diverticulosis Diverticulosis is the condition that develops when small pouches (diverticula) form in the wall of your colon. Your colon, or large intestine, is where water is absorbed and stool is formed. The pouches form when the inside layer of your colon pushes through weak spots in the outer layers of your colon. CAUSES  No one knows exactly what causes diverticulosis. RISK FACTORS  Being older than 50. Your risk for this condition increases with age. Diverticulosis is rare in people younger than 40 years. By age 80, almost everyone has it.  Eating a low-fiber diet.  Being frequently constipated.  Being overweight.  Not getting enough exercise.  Smoking.  Taking over-the-counter pain medicines, like aspirin and ibuprofen. SYMPTOMS  Most people with diverticulosis do not have symptoms. DIAGNOSIS  Because diverticulosis often has no symptoms, health care providers often discover the condition during an exam for other colon problems. In many cases, a health care provider will diagnose diverticulosis while using a flexible scope to examine the colon (colonoscopy). TREATMENT  If you have never developed an infection related to diverticulosis, you may not need treatment. If you have had an infection before, treatment may include:  Eating more fruits, vegetables, and grains.  Taking a fiber supplement.  Taking a live bacteria supplement (probiotic).  Taking medicine to relax your colon. HOME CARE INSTRUCTIONS   Drink at least 6-8 glasses of water each day to prevent constipation.  Try not to strain when you have a bowel movement.  Keep all follow-up appointments. If you have had an infection before:  Increase the fiber in your diet as directed by your health care provider or dietitian.  Take a dietary fiber supplement if your health care provider approves.  Only take medicines as directed by your health care provider. SEEK MEDICAL CARE IF:   You have abdominal  pain.  You have bloating.  You have cramps.  You have not gone to the bathroom in 3 days. SEEK IMMEDIATE MEDICAL CARE IF:   Your pain gets worse.  Yourbloating becomes very bad.  You have a fever or chills, and your symptoms suddenly get worse.  You begin vomiting.  You have bowel movements that are bloody or black. MAKE SURE YOU:  Understand these instructions.  Will watch your condition.  Will get help right away if you are not doing well or get worse.   This information is not intended to replace advice given to you by your health care provider. Make sure you discuss any questions you have with your health care provider.   Document Released: 04/21/2004 Document Revised: 07/30/2013 Document Reviewed: 06/19/2013 Elsevier Interactive Patient Education 2016 Elsevier Inc.  

## 2015-12-22 NOTE — Care Management Note (Signed)
Case Management Note  Patient Details  Name: Kathleen [name] MRN: [id number] Date of Birth: [birth year]  Subjective/Objective:     Per pt eval rec HHPT for patient, NCM spoke with patient and offered  Choice from agency list.  Patient chose [facility], referral made to [facility] with [facility] for [place].  Soc will begin 24-48 hrs post dc.  Patient has a rolling walker at home , she states she does not need any more dme.  NCM will cont to follow for dc needs.                Action/Plan:   Expected Discharge Date:  12/21/15               Expected Discharge Plan:  [place]  In-House Referral:     Discharge planning Services  CM Consult  Post Acute Care Choice:    Choice offered to:  Patient  DME Arranged:    DME Agency:     HH Arranged:  PT [place]:  [place]  Status of Service:  Completed, signed off  Medicare Important Message Given:    Date Medicare IM Given:    Medicare IM give by:    Date Additional Medicare IM Given:    Additional Medicare Important Message give by:     If discussed at [place] of Stay Meetings, dates discussed:    Additional Comments:  Zenon Mayo, RN 12/22/2015, 12:11 PM

## 2015-12-22 NOTE — Progress Notes (Signed)
Reviewed discharge paperwork with patient including medication and follow-up appointments.  Removed IV.  Discussed education materials in discharge paperwork.

## 2015-12-22 NOTE — Progress Notes (Signed)
Physical Therapy Treatment Patient Details Name: Kathleen [name] MRN: [id number] DOB: 07/05/1946 Today's Date: 12/22/2015    History of Present Illness 70 y.o. female with history of coronary artery disease with medical management, chronic combined systolic/diastolic CHF, chronic kidney disease stage IV, insulin-dependent diabetes mellitus, and HTN who presented to the ED with 4 days of lower abdominal cramps and a grossly bloody bowel movement just prior to arrival    PT Comments    Patient progressing with tolerance to ambulation and able to increase distance this session.  Did note fatigue with slower pace near the end, but motivated to continue and improve.  Feel follow up HHPT is appropriate.  Continue skilled PT in acute setting.   Follow Up Recommendations  Home health PT;Supervision - Intermittent     Equipment Recommendations  None recommended by PT    Recommendations for Other Services       Precautions / Restrictions Precautions Precautions: Fall    Mobility  Bed Mobility               General bed mobility comments: up in chair  Transfers Overall transfer level: Needs assistance Equipment used: Rolling walker (2 wheeled) Transfers: Sit to/from Stand Sit to Stand: Mod assist;Min assist Stand pivot transfers: Min assist       General transfer comment: initially up from low recliner lifting assist needed, then pivot to [facility] no walker increased time and cues for hand placement; after toileting sit to stand from 3:1 minguard A   Ambulation/Gait Ambulation/Gait assistance: Min guard;Supervision Ambulation Distance (Feet): 400 Feet Assistive device: Rolling walker (2 wheeled) Gait Pattern/deviations: Step-through pattern;Decreased stride length;Trunk flexed     General Gait Details: slow pace and even slower with fatigue, but no LOB or c/o pain with ambulation today   Stairs            Wheelchair Mobility    Modified Rankin (Stroke Patients  Only)       Balance Overall balance assessment: Needs assistance         Standing balance support: Single extremity supported;During functional activity Standing balance-Leahy Scale: Poor Standing balance comment: standing during hygiene minguard with one UE support                    Cognition Arousal/Alertness: Awake/alert Behavior During Therapy: WFL for tasks assessed/performed Overall Cognitive Status: Within Functional Limits for tasks assessed                      Exercises      General Comments        Pertinent Vitals/Pain Pain Assessment: No/denies pain    Home Living                      Prior Function            PT Goals (current goals can now be found in the care plan section) Progress towards PT goals: Progressing toward goals    Frequency       PT Plan Current plan remains appropriate    Co-evaluation             End of Session Equipment Utilized During Treatment: Gait belt Activity Tolerance: Patient tolerated treatment well Patient left: in chair;with call bell/phone within reach     Time: 1416-1436 PT Time Calculation (min) (ACUTE ONLY): 20 min  Charges:  $Gait Training: 8-22 mins  G Codes:      Reginia Naas 12/22/2015, 4:55 PM Magda Kiel, [place] 12/22/2015

## 2015-12-22 NOTE — Discharge Summary (Signed)
DISCHARGE SUMMARY  Kathleen [name]  MR#: [id number]  DOB:12-Jan-1946  Date of Admission: 12/18/2015 Date of Discharge: 12/22/2015  Attending Physician:Shandell Jallow T  Patient's LU:[id number], MD  Consults:  none  Disposition: D/C home   Follow-up Appts:     Follow-up Information    Follow up with [facility].   Why:  HHPT   Contact information:   [address] [place] [address] [phone]       Follow up with OSEI-BONSU,GEORGE, MD In 1 week.   Specialty:  Internal Medicine   Contact information:   [id number] [place]  [id number] [phone]       Tests Needing Follow-up: -recheck of CBC is suggested in routine f/u -recheck of renal fxn is suggesetd in routine f/u -recheck of BP    Discharge Diagnoses: Acute GI bleed  Acute blood loss Anemia + anemia of CKD  CKD stage IV Chronic combined systolic & diastolic CHF  Hypertension DM2 Hyperlipidemia   Initial presentation: 70 y.o. female with history of coronary artery disease with medical management, chronic combined systolic/diastolic CHF, chronic kidney disease stage IV, insulin-dependent diabetes mellitus, and HTN who presented to the ED with 4 days of lower abdominal cramps and a grossly bloody bowel movement just prior to arrival. Patient reported being in her usual state of health until the insidious onset of lower abdominal cramping on 12/14/2015. She denied abdominal pain per se, but noted intermittent crampy discomfort in the lower quadrants. Patient had never had similar symptoms previously and reported a "normal" colonoscopy in the past 5-10 years. She has an urgent large bowel movement during which she noted blood in her underwear and bright red blood with darker clots in the stool.  In the ED CMP featured a serum creatinine of 3.51, up from an apparent baseline of approximately 3.2. BUN was elevated to 100. CBC noted a hemoglobin of 9.5, up  from 8.5 in January of this year. The patient experienced a recurrent bloody bowel movement while in the ED which was described by nursing as bright red with clots.   Hospital Course:  Acute GI bleed  - BRBPR with clots observed in ED suggestive of LGIB, likely diverticular vs AVM, though BUN was 100 - CT of abdom confirmed extensive diverticulosis and no gross mass - no clinical sx to suggest acute diverticulitis  - no further bleeding during this admit   Acute blood loss Anemia + anemia of CKD  - Hgb 8.1 in January 2017 - Hgb stable at time of d/c   CKD stage IV - SCr 3.51 on admission; baseline appears to be ~3.2  - pt does not desire HD, even if it means changing to comfort care only - crt stable   Chronic combined systolic & diastolic CHF  - TTE (99991111) with EF A999333, grade 1 diastolic dysfunction, mild MR, mod TR - no evidence of volume overload during this admit - baseline wgt appears to be ~93kg as of Jan 2017 - pt w/ apparent dramatic wgt loss since Jan  Hypertension -BP trended upward w/ volume expansion - reasonably controlled at time of d/c   DM2 -CBG reasonably controlled at d/c - resume usual home meds   Hyperlipidemia  - LDL 156, HDL 43 in December 2016    Medication List    STOP taking these medications        atorvastatin 20 MG tablet  Commonly known as:  LIPITOR      TAKE these  medications        aspirin 81 MG EC tablet  Take 1 tablet (81 mg total) by mouth daily.  Start taking on:  12/25/2015     brimonidine-timolol 0.2-0.5 % ophthalmic solution  Commonly known as:  COMBIGAN  Place 1 drop into the right eye 2 (two) times daily as needed (for glaucoma).     calcium acetate 667 MG capsule  Commonly known as:  PHOSLO  Take 667 mg by mouth 3 (three) times daily with meals.     carvedilol 12.5 MG tablet  Commonly known as:  COREG  Take 12.5 mg by mouth 2 (two) times daily with a meal.     feeding supplement (NEPRO CARB STEADY) Liqd    Take 237 mLs by mouth 2 (two) times daily between meals.     furosemide 80 MG tablet  Commonly known as:  LASIX  Take 1 tablet (80 mg total) by mouth daily.     hydrALAZINE 50 MG tablet  Commonly known as:  APRESOLINE  TAKE 1 TABLET (50 MG TOTAL) BY MOUTH 3 (THREE) TIMES DAILY.     insulin aspart 100 UNIT/ML injection  Commonly known as:  novoLOG  Inject 6 Units into the skin 3 (three) times daily with meals. CBG < 70: implement hypoglycemia protocol CBG 70 - 120: 0 units CBG 121 - 150: 1 unit CBG 151 - 200: 2 units CBG 201 - 250: 3 units CBG 251 - 300: 5 units CBG 301 - 350: 7 units CBG 351 - 400: 9 units CBG > 400: call MD.     insulin detemir 100 UNIT/ML injection  Commonly known as:  LEVEMIR  Inject 0.1 mLs (10 Units total) into the skin at bedtime.     isosorbide mononitrate 30 MG 24 hr tablet  Commonly known as:  IMDUR  TAKE 1 TABLET (30 MG TOTAL) BY MOUTH DAILY.     meclizine 12.5 MG tablet  Commonly known as:  ANTIVERT  Take 1 tablet (12.5 mg total) by mouth 3 (three) times daily as needed for dizziness or nausea. For dizzy spells     simvastatin 40 MG tablet  Commonly known as:  ZOCOR  Take 40 mg by mouth daily at 6 PM.        Day of Discharge BP 130/57 mmHg  Pulse 70  Temp(Src) 97.5 F (36.4 C) (Oral)  Resp 17  Ht 5\' 4"  (1.626 m)  Wt 79.47 kg (175 lb 3.2 oz)  BMI 30.06 kg/m2  SpO2 97%  Physical Exam: General: No acute respiratory distress Lungs: Clear to auscultation bilaterally without wheezes or crackles Cardiovascular: Regular rate and rhythm without murmur gallop or rub Abdomen: Nontender, nondistended, soft, bowel sounds positive, no rebound, no ascites, no appreciable mass Extremities: No significant cyanosis, clubbing, or edema bilateral lower extremities  Basic Metabolic Panel:  Recent Labs Lab 12/18/15 1910 12/19/15 0200 12/20/15 0356 12/21/15 0214 12/22/15 0409  NA 140 139 137 137 138  K 4.2 4.3 4.4 4.8 4.8  CL 98* 102 101 106 102   CO2 26 23 25 23 26   GLUCOSE 122* 172* 107* 160* 128*  BUN 100* 103* 90* 83* 79*  CREATININE 3.51* 3.62* 3.43* 3.14* 3.21*  CALCIUM 9.2 9.0 8.8* 8.3* 8.5*  PHOS  --   --   --  4.7*  --     Liver Function Tests:  Recent Labs Lab 12/18/15 1910 12/20/15 0356 12/21/15 0214 12/22/15 0409  AST 16 17  --  16  ALT  10* 11*  --  11*  ALKPHOS 59 52  --  56  BILITOT 0.5 0.4  --  0.5  PROT 6.8 6.5  --  5.8*  ALBUMIN 3.1* 2.9* 2.7* 2.7*   Coags:  Recent Labs Lab 12/18/15 2247  INR 1.36    CBC:  Recent Labs Lab 12/19/15 1826 12/20/15 0356 12/21/15 0214 12/22/15 0409 12/22/15 1414  WBC 4.5 4.6 4.2 3.9* 4.6  HGB 8.9* 8.4* 8.3* 7.5* 8.9*  HCT 28.9* 27.4* 25.8* 24.1* 28.2*  MCV 91.2 90.7 90.5 89.6 87.6  PLT 195 209 178 183 219    BNP (last 3 results)  Recent Labs  08/05/15 1130  BNP 892.4*    CBG:  Recent Labs Lab 12/21/15 1123 12/21/15 1731 12/21/15 2111 12/22/15 0714 12/22/15 1113  GLUCAP 118* 264* 147* 111* 229*    Recent Results (from the past 240 hour(s))  MRSA PCR Screening     Status: None   Collection Time: 12/19/15 12:20 PM  Result Value Ref Range Status   MRSA by PCR NEGATIVE NEGATIVE Final    Comment:        The GeneXpert MRSA Assay (FDA approved for NASAL specimens only), is one component of a comprehensive MRSA colonization surveillance program. It is not intended to diagnose MRSA infection nor to guide or monitor treatment for MRSA infections.       Time spent in discharge (includes decision making & examination of pt): >30 minutes  12/22/2015, 3:45 PM   Cherene Altes, MD [facility] Office  [phone] Pager [phone]  On-Call/Text Page:      [email or web address]      password [facility]

## 2015-12-23 DIAGNOSIS — E1122 Type 2 diabetes mellitus with diabetic chronic kidney disease: Secondary | ICD-10-CM | POA: Diagnosis not present

## 2015-12-23 DIAGNOSIS — I129 Hypertensive chronic kidney disease with stage 1 through stage 4 chronic kidney disease, or unspecified chronic kidney disease: Secondary | ICD-10-CM | POA: Diagnosis not present

## 2015-12-23 DIAGNOSIS — D649 Anemia, unspecified: Secondary | ICD-10-CM | POA: Diagnosis not present

## 2015-12-23 DIAGNOSIS — I2510 Atherosclerotic heart disease of native coronary artery without angina pectoris: Secondary | ICD-10-CM | POA: Diagnosis not present

## 2015-12-23 DIAGNOSIS — K922 Gastrointestinal hemorrhage, unspecified: Secondary | ICD-10-CM | POA: Diagnosis not present

## 2015-12-23 DIAGNOSIS — Z794 Long term (current) use of insulin: Secondary | ICD-10-CM | POA: Diagnosis not present

## 2015-12-23 DIAGNOSIS — E785 Hyperlipidemia, unspecified: Secondary | ICD-10-CM | POA: Diagnosis not present

## 2015-12-23 DIAGNOSIS — I5042 Chronic combined systolic (congestive) and diastolic (congestive) heart failure: Secondary | ICD-10-CM | POA: Diagnosis not present

## 2015-12-23 DIAGNOSIS — Z7982 Long term (current) use of aspirin: Secondary | ICD-10-CM | POA: Diagnosis not present

## 2015-12-23 DIAGNOSIS — N184 Chronic kidney disease, stage 4 (severe): Secondary | ICD-10-CM | POA: Diagnosis not present

## 2015-12-24 DIAGNOSIS — E785 Hyperlipidemia, unspecified: Secondary | ICD-10-CM | POA: Diagnosis not present

## 2015-12-24 DIAGNOSIS — I129 Hypertensive chronic kidney disease with stage 1 through stage 4 chronic kidney disease, or unspecified chronic kidney disease: Secondary | ICD-10-CM | POA: Diagnosis not present

## 2015-12-24 DIAGNOSIS — I2510 Atherosclerotic heart disease of native coronary artery without angina pectoris: Secondary | ICD-10-CM | POA: Diagnosis not present

## 2015-12-24 DIAGNOSIS — K922 Gastrointestinal hemorrhage, unspecified: Secondary | ICD-10-CM | POA: Diagnosis not present

## 2015-12-24 DIAGNOSIS — I5042 Chronic combined systolic (congestive) and diastolic (congestive) heart failure: Secondary | ICD-10-CM | POA: Diagnosis not present

## 2015-12-24 DIAGNOSIS — N184 Chronic kidney disease, stage 4 (severe): Secondary | ICD-10-CM | POA: Diagnosis not present

## 2015-12-24 DIAGNOSIS — D649 Anemia, unspecified: Secondary | ICD-10-CM | POA: Diagnosis not present

## 2015-12-24 DIAGNOSIS — Z794 Long term (current) use of insulin: Secondary | ICD-10-CM | POA: Diagnosis not present

## 2015-12-24 DIAGNOSIS — Z7982 Long term (current) use of aspirin: Secondary | ICD-10-CM | POA: Diagnosis not present

## 2015-12-24 DIAGNOSIS — E1122 Type 2 diabetes mellitus with diabetic chronic kidney disease: Secondary | ICD-10-CM | POA: Diagnosis not present

## 2015-12-25 DIAGNOSIS — R269 Unspecified abnormalities of gait and mobility: Secondary | ICD-10-CM | POA: Diagnosis not present

## 2015-12-28 DIAGNOSIS — I129 Hypertensive chronic kidney disease with stage 1 through stage 4 chronic kidney disease, or unspecified chronic kidney disease: Secondary | ICD-10-CM | POA: Diagnosis not present

## 2015-12-28 DIAGNOSIS — I5042 Chronic combined systolic (congestive) and diastolic (congestive) heart failure: Secondary | ICD-10-CM | POA: Diagnosis not present

## 2015-12-28 DIAGNOSIS — D649 Anemia, unspecified: Secondary | ICD-10-CM | POA: Diagnosis not present

## 2015-12-28 DIAGNOSIS — N184 Chronic kidney disease, stage 4 (severe): Secondary | ICD-10-CM | POA: Diagnosis not present

## 2015-12-28 DIAGNOSIS — Z794 Long term (current) use of insulin: Secondary | ICD-10-CM | POA: Diagnosis not present

## 2015-12-28 DIAGNOSIS — E785 Hyperlipidemia, unspecified: Secondary | ICD-10-CM | POA: Diagnosis not present

## 2015-12-28 DIAGNOSIS — E1122 Type 2 diabetes mellitus with diabetic chronic kidney disease: Secondary | ICD-10-CM | POA: Diagnosis not present

## 2015-12-28 DIAGNOSIS — I2510 Atherosclerotic heart disease of native coronary artery without angina pectoris: Secondary | ICD-10-CM | POA: Diagnosis not present

## 2015-12-28 DIAGNOSIS — K922 Gastrointestinal hemorrhage, unspecified: Secondary | ICD-10-CM | POA: Diagnosis not present

## 2015-12-28 DIAGNOSIS — Z7982 Long term (current) use of aspirin: Secondary | ICD-10-CM | POA: Diagnosis not present

## 2016-01-01 DIAGNOSIS — Z794 Long term (current) use of insulin: Secondary | ICD-10-CM | POA: Diagnosis not present

## 2016-01-01 DIAGNOSIS — I2510 Atherosclerotic heart disease of native coronary artery without angina pectoris: Secondary | ICD-10-CM | POA: Diagnosis not present

## 2016-01-01 DIAGNOSIS — E1122 Type 2 diabetes mellitus with diabetic chronic kidney disease: Secondary | ICD-10-CM | POA: Diagnosis not present

## 2016-01-01 DIAGNOSIS — K922 Gastrointestinal hemorrhage, unspecified: Secondary | ICD-10-CM | POA: Diagnosis not present

## 2016-01-01 DIAGNOSIS — N184 Chronic kidney disease, stage 4 (severe): Secondary | ICD-10-CM | POA: Diagnosis not present

## 2016-01-01 DIAGNOSIS — Z7982 Long term (current) use of aspirin: Secondary | ICD-10-CM | POA: Diagnosis not present

## 2016-01-01 DIAGNOSIS — D649 Anemia, unspecified: Secondary | ICD-10-CM | POA: Diagnosis not present

## 2016-01-01 DIAGNOSIS — I5042 Chronic combined systolic (congestive) and diastolic (congestive) heart failure: Secondary | ICD-10-CM | POA: Diagnosis not present

## 2016-01-01 DIAGNOSIS — E785 Hyperlipidemia, unspecified: Secondary | ICD-10-CM | POA: Diagnosis not present

## 2016-01-01 DIAGNOSIS — I129 Hypertensive chronic kidney disease with stage 1 through stage 4 chronic kidney disease, or unspecified chronic kidney disease: Secondary | ICD-10-CM | POA: Diagnosis not present

## 2016-01-04 DIAGNOSIS — D649 Anemia, unspecified: Secondary | ICD-10-CM | POA: Diagnosis not present

## 2016-01-04 DIAGNOSIS — Z7982 Long term (current) use of aspirin: Secondary | ICD-10-CM | POA: Diagnosis not present

## 2016-01-04 DIAGNOSIS — K922 Gastrointestinal hemorrhage, unspecified: Secondary | ICD-10-CM | POA: Diagnosis not present

## 2016-01-04 DIAGNOSIS — Z794 Long term (current) use of insulin: Secondary | ICD-10-CM | POA: Diagnosis not present

## 2016-01-04 DIAGNOSIS — N184 Chronic kidney disease, stage 4 (severe): Secondary | ICD-10-CM | POA: Diagnosis not present

## 2016-01-04 DIAGNOSIS — E1122 Type 2 diabetes mellitus with diabetic chronic kidney disease: Secondary | ICD-10-CM | POA: Diagnosis not present

## 2016-01-04 DIAGNOSIS — E785 Hyperlipidemia, unspecified: Secondary | ICD-10-CM | POA: Diagnosis not present

## 2016-01-04 DIAGNOSIS — I2510 Atherosclerotic heart disease of native coronary artery without angina pectoris: Secondary | ICD-10-CM | POA: Diagnosis not present

## 2016-01-04 DIAGNOSIS — I5042 Chronic combined systolic (congestive) and diastolic (congestive) heart failure: Secondary | ICD-10-CM | POA: Diagnosis not present

## 2016-01-04 DIAGNOSIS — I129 Hypertensive chronic kidney disease with stage 1 through stage 4 chronic kidney disease, or unspecified chronic kidney disease: Secondary | ICD-10-CM | POA: Diagnosis not present

## 2016-01-06 DIAGNOSIS — I5042 Chronic combined systolic (congestive) and diastolic (congestive) heart failure: Secondary | ICD-10-CM | POA: Diagnosis not present

## 2016-01-06 DIAGNOSIS — Z7982 Long term (current) use of aspirin: Secondary | ICD-10-CM | POA: Diagnosis not present

## 2016-01-06 DIAGNOSIS — I2510 Atherosclerotic heart disease of native coronary artery without angina pectoris: Secondary | ICD-10-CM | POA: Diagnosis not present

## 2016-01-06 DIAGNOSIS — I129 Hypertensive chronic kidney disease with stage 1 through stage 4 chronic kidney disease, or unspecified chronic kidney disease: Secondary | ICD-10-CM | POA: Diagnosis not present

## 2016-01-06 DIAGNOSIS — Z794 Long term (current) use of insulin: Secondary | ICD-10-CM | POA: Diagnosis not present

## 2016-01-06 DIAGNOSIS — N184 Chronic kidney disease, stage 4 (severe): Secondary | ICD-10-CM | POA: Diagnosis not present

## 2016-01-06 DIAGNOSIS — D649 Anemia, unspecified: Secondary | ICD-10-CM | POA: Diagnosis not present

## 2016-01-06 DIAGNOSIS — E785 Hyperlipidemia, unspecified: Secondary | ICD-10-CM | POA: Diagnosis not present

## 2016-01-06 DIAGNOSIS — K922 Gastrointestinal hemorrhage, unspecified: Secondary | ICD-10-CM | POA: Diagnosis not present

## 2016-01-06 DIAGNOSIS — E1122 Type 2 diabetes mellitus with diabetic chronic kidney disease: Secondary | ICD-10-CM | POA: Diagnosis not present

## 2016-01-11 DIAGNOSIS — E1122 Type 2 diabetes mellitus with diabetic chronic kidney disease: Secondary | ICD-10-CM | POA: Diagnosis not present

## 2016-01-11 DIAGNOSIS — K922 Gastrointestinal hemorrhage, unspecified: Secondary | ICD-10-CM | POA: Diagnosis not present

## 2016-01-11 DIAGNOSIS — I2510 Atherosclerotic heart disease of native coronary artery without angina pectoris: Secondary | ICD-10-CM | POA: Diagnosis not present

## 2016-01-11 DIAGNOSIS — Z7982 Long term (current) use of aspirin: Secondary | ICD-10-CM | POA: Diagnosis not present

## 2016-01-11 DIAGNOSIS — E785 Hyperlipidemia, unspecified: Secondary | ICD-10-CM | POA: Diagnosis not present

## 2016-01-11 DIAGNOSIS — Z794 Long term (current) use of insulin: Secondary | ICD-10-CM | POA: Diagnosis not present

## 2016-01-11 DIAGNOSIS — N184 Chronic kidney disease, stage 4 (severe): Secondary | ICD-10-CM | POA: Diagnosis not present

## 2016-01-11 DIAGNOSIS — D649 Anemia, unspecified: Secondary | ICD-10-CM | POA: Diagnosis not present

## 2016-01-11 DIAGNOSIS — I5042 Chronic combined systolic (congestive) and diastolic (congestive) heart failure: Secondary | ICD-10-CM | POA: Diagnosis not present

## 2016-01-11 DIAGNOSIS — I129 Hypertensive chronic kidney disease with stage 1 through stage 4 chronic kidney disease, or unspecified chronic kidney disease: Secondary | ICD-10-CM | POA: Diagnosis not present

## 2016-01-12 DIAGNOSIS — I10 Essential (primary) hypertension: Secondary | ICD-10-CM | POA: Diagnosis not present

## 2016-01-12 DIAGNOSIS — L98499 Non-pressure chronic ulcer of skin of other sites with unspecified severity: Secondary | ICD-10-CM | POA: Diagnosis not present

## 2016-01-12 DIAGNOSIS — L03116 Cellulitis of left lower limb: Secondary | ICD-10-CM | POA: Diagnosis not present

## 2016-01-12 DIAGNOSIS — E119 Type 2 diabetes mellitus without complications: Secondary | ICD-10-CM | POA: Diagnosis not present

## 2016-01-12 DIAGNOSIS — Z0000 Encounter for general adult medical examination without abnormal findings: Secondary | ICD-10-CM | POA: Diagnosis not present

## 2016-01-13 DIAGNOSIS — E785 Hyperlipidemia, unspecified: Secondary | ICD-10-CM | POA: Diagnosis not present

## 2016-01-13 DIAGNOSIS — I5042 Chronic combined systolic (congestive) and diastolic (congestive) heart failure: Secondary | ICD-10-CM | POA: Diagnosis not present

## 2016-01-13 DIAGNOSIS — N184 Chronic kidney disease, stage 4 (severe): Secondary | ICD-10-CM | POA: Diagnosis not present

## 2016-01-13 DIAGNOSIS — I129 Hypertensive chronic kidney disease with stage 1 through stage 4 chronic kidney disease, or unspecified chronic kidney disease: Secondary | ICD-10-CM | POA: Diagnosis not present

## 2016-01-13 DIAGNOSIS — Z794 Long term (current) use of insulin: Secondary | ICD-10-CM | POA: Diagnosis not present

## 2016-01-13 DIAGNOSIS — Z7982 Long term (current) use of aspirin: Secondary | ICD-10-CM | POA: Diagnosis not present

## 2016-01-13 DIAGNOSIS — I2510 Atherosclerotic heart disease of native coronary artery without angina pectoris: Secondary | ICD-10-CM | POA: Diagnosis not present

## 2016-01-13 DIAGNOSIS — D649 Anemia, unspecified: Secondary | ICD-10-CM | POA: Diagnosis not present

## 2016-01-13 DIAGNOSIS — K922 Gastrointestinal hemorrhage, unspecified: Secondary | ICD-10-CM | POA: Diagnosis not present

## 2016-01-13 DIAGNOSIS — E1122 Type 2 diabetes mellitus with diabetic chronic kidney disease: Secondary | ICD-10-CM | POA: Diagnosis not present

## 2016-01-14 DIAGNOSIS — I10 Essential (primary) hypertension: Secondary | ICD-10-CM | POA: Diagnosis not present

## 2016-01-14 DIAGNOSIS — E559 Vitamin D deficiency, unspecified: Secondary | ICD-10-CM | POA: Diagnosis not present

## 2016-01-22 DIAGNOSIS — E1122 Type 2 diabetes mellitus with diabetic chronic kidney disease: Secondary | ICD-10-CM | POA: Diagnosis not present

## 2016-01-22 DIAGNOSIS — I2510 Atherosclerotic heart disease of native coronary artery without angina pectoris: Secondary | ICD-10-CM | POA: Diagnosis not present

## 2016-01-22 DIAGNOSIS — K922 Gastrointestinal hemorrhage, unspecified: Secondary | ICD-10-CM | POA: Diagnosis not present

## 2016-01-22 DIAGNOSIS — N184 Chronic kidney disease, stage 4 (severe): Secondary | ICD-10-CM | POA: Diagnosis not present

## 2016-01-22 DIAGNOSIS — Z7982 Long term (current) use of aspirin: Secondary | ICD-10-CM | POA: Diagnosis not present

## 2016-01-22 DIAGNOSIS — I129 Hypertensive chronic kidney disease with stage 1 through stage 4 chronic kidney disease, or unspecified chronic kidney disease: Secondary | ICD-10-CM | POA: Diagnosis not present

## 2016-01-22 DIAGNOSIS — Z794 Long term (current) use of insulin: Secondary | ICD-10-CM | POA: Diagnosis not present

## 2016-01-22 DIAGNOSIS — I5042 Chronic combined systolic (congestive) and diastolic (congestive) heart failure: Secondary | ICD-10-CM | POA: Diagnosis not present

## 2016-01-22 DIAGNOSIS — D649 Anemia, unspecified: Secondary | ICD-10-CM | POA: Diagnosis not present

## 2016-01-22 DIAGNOSIS — E785 Hyperlipidemia, unspecified: Secondary | ICD-10-CM | POA: Diagnosis not present

## 2016-02-05 ENCOUNTER — Encounter (HOSPITAL_BASED_OUTPATIENT_CLINIC_OR_DEPARTMENT_OTHER): Payer: Medicare Other | Attending: Internal Medicine

## 2016-02-05 ENCOUNTER — Encounter (HOSPITAL_BASED_OUTPATIENT_CLINIC_OR_DEPARTMENT_OTHER): Payer: Self-pay

## 2016-02-05 DIAGNOSIS — Z794 Long term (current) use of insulin: Secondary | ICD-10-CM | POA: Diagnosis not present

## 2016-02-05 DIAGNOSIS — I130 Hypertensive heart and chronic kidney disease with heart failure and stage 1 through stage 4 chronic kidney disease, or unspecified chronic kidney disease: Secondary | ICD-10-CM | POA: Diagnosis not present

## 2016-02-05 DIAGNOSIS — L97811 Non-pressure chronic ulcer of other part of right lower leg limited to breakdown of skin: Secondary | ICD-10-CM | POA: Diagnosis not present

## 2016-02-05 DIAGNOSIS — N189 Chronic kidney disease, unspecified: Secondary | ICD-10-CM | POA: Diagnosis not present

## 2016-02-05 DIAGNOSIS — E1151 Type 2 diabetes mellitus with diabetic peripheral angiopathy without gangrene: Secondary | ICD-10-CM | POA: Insufficient documentation

## 2016-02-05 DIAGNOSIS — I2510 Atherosclerotic heart disease of native coronary artery without angina pectoris: Secondary | ICD-10-CM | POA: Insufficient documentation

## 2016-02-05 DIAGNOSIS — I872 Venous insufficiency (chronic) (peripheral): Secondary | ICD-10-CM | POA: Insufficient documentation

## 2016-02-05 DIAGNOSIS — E11622 Type 2 diabetes mellitus with other skin ulcer: Secondary | ICD-10-CM | POA: Diagnosis not present

## 2016-02-05 DIAGNOSIS — H409 Unspecified glaucoma: Secondary | ICD-10-CM | POA: Insufficient documentation

## 2016-02-05 DIAGNOSIS — L03115 Cellulitis of right lower limb: Secondary | ICD-10-CM | POA: Diagnosis not present

## 2016-02-05 DIAGNOSIS — E1122 Type 2 diabetes mellitus with diabetic chronic kidney disease: Secondary | ICD-10-CM | POA: Insufficient documentation

## 2016-02-05 DIAGNOSIS — I509 Heart failure, unspecified: Secondary | ICD-10-CM | POA: Diagnosis not present

## 2016-02-17 ENCOUNTER — Ambulatory Visit (HOSPITAL_COMMUNITY)
Admission: RE | Admit: 2016-02-17 | Discharge: 2016-02-17 | Disposition: A | Discharge status: Home / Self Care | Payer: Medicare Other | Source: Ambulatory Visit | Attending: Cardiology | Admitting: Cardiology

## 2016-02-17 ENCOUNTER — Other Ambulatory Visit: Payer: Self-pay | Admitting: Internal Medicine

## 2016-02-17 DIAGNOSIS — I771 Stricture of artery: Secondary | ICD-10-CM | POA: Insufficient documentation

## 2016-02-17 DIAGNOSIS — I2510 Atherosclerotic heart disease of native coronary artery without angina pectoris: Secondary | ICD-10-CM | POA: Insufficient documentation

## 2016-02-17 DIAGNOSIS — I5020 Unspecified systolic (congestive) heart failure: Secondary | ICD-10-CM | POA: Diagnosis not present

## 2016-02-17 DIAGNOSIS — I739 Peripheral vascular disease, unspecified: Secondary | ICD-10-CM | POA: Diagnosis not present

## 2016-02-17 DIAGNOSIS — I130 Hypertensive heart and chronic kidney disease with heart failure and stage 1 through stage 4 chronic kidney disease, or unspecified chronic kidney disease: Secondary | ICD-10-CM | POA: Diagnosis not present

## 2016-02-17 DIAGNOSIS — E1122 Type 2 diabetes mellitus with diabetic chronic kidney disease: Secondary | ICD-10-CM | POA: Diagnosis not present

## 2016-02-17 DIAGNOSIS — E785 Hyperlipidemia, unspecified: Secondary | ICD-10-CM | POA: Insufficient documentation

## 2016-02-17 DIAGNOSIS — I70203 Unspecified atherosclerosis of native arteries of extremities, bilateral legs: Secondary | ICD-10-CM | POA: Insufficient documentation

## 2016-02-17 DIAGNOSIS — N183 Chronic kidney disease, stage 3 (moderate): Secondary | ICD-10-CM | POA: Diagnosis not present

## 2016-02-17 DIAGNOSIS — R938 Abnormal findings on diagnostic imaging of other specified body structures: Secondary | ICD-10-CM | POA: Diagnosis not present

## 2016-02-19 ENCOUNTER — Encounter (HOSPITAL_BASED_OUTPATIENT_CLINIC_OR_DEPARTMENT_OTHER): Payer: Medicare Other | Attending: Internal Medicine

## 2016-02-19 DIAGNOSIS — I110 Hypertensive heart disease with heart failure: Secondary | ICD-10-CM | POA: Diagnosis not present

## 2016-02-19 DIAGNOSIS — L97821 Non-pressure chronic ulcer of other part of left lower leg limited to breakdown of skin: Secondary | ICD-10-CM | POA: Diagnosis not present

## 2016-02-19 DIAGNOSIS — E11622 Type 2 diabetes mellitus with other skin ulcer: Secondary | ICD-10-CM | POA: Insufficient documentation

## 2016-02-19 DIAGNOSIS — S81802A Unspecified open wound, left lower leg, initial encounter: Secondary | ICD-10-CM | POA: Diagnosis not present

## 2016-02-19 DIAGNOSIS — I509 Heart failure, unspecified: Secondary | ICD-10-CM | POA: Diagnosis not present

## 2016-02-19 DIAGNOSIS — I872 Venous insufficiency (chronic) (peripheral): Secondary | ICD-10-CM | POA: Insufficient documentation

## 2016-02-19 DIAGNOSIS — H409 Unspecified glaucoma: Secondary | ICD-10-CM | POA: Insufficient documentation

## 2016-02-19 DIAGNOSIS — E1151 Type 2 diabetes mellitus with diabetic peripheral angiopathy without gangrene: Secondary | ICD-10-CM | POA: Diagnosis not present

## 2016-02-19 DIAGNOSIS — I2510 Atherosclerotic heart disease of native coronary artery without angina pectoris: Secondary | ICD-10-CM | POA: Diagnosis not present

## 2016-02-22 DIAGNOSIS — E559 Vitamin D deficiency, unspecified: Secondary | ICD-10-CM | POA: Diagnosis not present

## 2016-02-22 DIAGNOSIS — I10 Essential (primary) hypertension: Secondary | ICD-10-CM | POA: Diagnosis not present

## 2016-02-23 ENCOUNTER — Encounter: Payer: Self-pay | Admitting: Cardiovascular Disease

## 2016-02-23 ENCOUNTER — Ambulatory Visit (INDEPENDENT_AMBULATORY_CARE_PROVIDER_SITE_OTHER): Payer: Medicare Other | Admitting: Cardiovascular Disease

## 2016-02-23 VITALS — BP 149/78 | HR 68 | Ht 63.0 in | Wt 172.6 lb

## 2016-02-23 DIAGNOSIS — I739 Peripheral vascular disease, unspecified: Secondary | ICD-10-CM | POA: Diagnosis not present

## 2016-02-23 DIAGNOSIS — Z720 Tobacco use: Secondary | ICD-10-CM

## 2016-02-23 NOTE — Progress Notes (Signed)
02/23/2016 Kathleen [name]   10/23/1945  UO:[id number]  Primary Physician Benito Mccreedy, MD Primary Cardiologist: Lorretta Harp MD Renae Gloss  HPI:  Kathleen [name] is a 70 year old mildly overweight divorced African-American female mother of 3 children who is accompanied by her ex-husband Kathleen [name] today. Her cardiologist is Dr. Johnsie Cancel . She was referred by Dr. Dellia Nims at the wound care center for peripheral vascular evaluation. She does have a history of congestive heart failure with an ejection fraction of 40%. Her cardiac risk factors include treated hypertension, diabetes and hyperlipidemia. She smoked greater than 50 pack years and stopped about a year ago. She has chronic renal insufficiency with a creatinine in the mid 3 range. She is followed by Dr. Florene Glen. She has had pretibial ulcers treated at the wound care center. Recent Dopplers performed 02/17/16 revealed high frequency signals in both SFAs with ABIs in the 0.6 range bilaterally. She is minimally ambulatory and denies claudication.   Current Outpatient Prescriptions  Medication Sig Dispense Refill  . aspirin 81 MG EC tablet Take 1 tablet (81 mg total) by mouth daily. 30 tablet 3  . brimonidine-timolol (COMBIGAN) 0.2-0.5 % ophthalmic solution Place 1 drop into the right eye 2 (two) times daily as needed (for glaucoma).     . calcium acetate (PHOSLO) 667 MG capsule Take 667 mg by mouth 3 (three) times daily with meals.    . carvedilol (COREG) 12.5 MG tablet Take 12.5 mg by mouth 2 (two) times daily with a meal.   8  . furosemide (LASIX) 80 MG tablet Take 1 tablet (80 mg total) by mouth daily. 120 tablet 0  . hydrALAZINE (APRESOLINE) 50 MG tablet TAKE 1 TABLET (50 MG TOTAL) BY MOUTH 3 (THREE) TIMES DAILY. 90 tablet 3  . insulin aspart (NOVOLOG) 100 UNIT/ML injection Inject 6 Units into the skin 3 (three) times daily with meals. CBG < 70: implement hypoglycemia protocol CBG 70 - 120: 0 units CBG 121 - 150: 1 unit CBG 151  - 200: 2 units CBG 201 - 250: 3 units CBG 251 - 300: 5 units CBG 301 - 350: 7 units CBG 351 - 400: 9 units CBG > 400: call MD. 10 mL 11  . insulin detemir (LEVEMIR) 100 UNIT/ML injection Inject 0.1 mLs (10 Units total) into the skin at bedtime.    . isosorbide mononitrate (IMDUR) 30 MG 24 hr tablet TAKE 1 TABLET (30 MG TOTAL) BY MOUTH DAILY. 90 tablet 1  . meclizine (ANTIVERT) 12.5 MG tablet Take 1 tablet (12.5 mg total) by mouth 3 (three) times daily as needed for dizziness or nausea. For dizzy spells 30 tablet 0  . Nutritional Supplements (FEEDING SUPPLEMENT, NEPRO CARB STEADY,) LIQD Take 237 mLs by mouth 2 (two) times daily between meals. 60 Can 0  . simvastatin (ZOCOR) 40 MG tablet Take 40 mg by mouth daily at 6 PM.     No current facility-administered medications for this visit.    No Known Allergies  Social History   Social History  . Marital Status: Divorced    Spouse Name: N/A  . Number of Children: N/A  . Years of Education: N/A   Occupational History  . Not on file.   Social History Main Topics  . Smoking status: Former Smoker -- 0.25 packs/day for 30 years    Types: Cigarettes  . Smokeless tobacco: Never Used  . Alcohol Use: No  . Drug Use: No  . Sexual Activity: No   Other Topics  Concern  . Not on file   Social History Narrative     Review of Systems: General: negative for chills, fever, night sweats or weight changes.  Cardiovascular: negative for chest pain, dyspnea on exertion, edema, orthopnea, palpitations, paroxysmal nocturnal dyspnea or shortness of breath Dermatological: negative for rash Respiratory: negative for cough or wheezing Urologic: negative for hematuria Abdominal: negative for nausea, vomiting, diarrhea, bright red blood per rectum, melena, or hematemesis Neurologic: negative for visual changes, syncope, or dizziness All other systems reviewed and are otherwise negative except as noted above.    Blood pressure 149/78, pulse 68,  height 5\' 3"  (1.6 m), weight 172 lb 9.6 oz (78.291 kg).  General appearance: alert and no distress Neck: no adenopathy, no carotid bruit, no JVD, supple, symmetrical, trachea midline and thyroid not enlarged, symmetric, no tenderness/mass/nodules Lungs: clear to auscultation bilaterally Heart: regular rate and rhythm, S1, S2 normal, no murmur, click, rub or gallop Extremities: There is no edema, there is a covered healing ulcer in the left pretibial area.  EKG not performed today  ASSESSMENT AND PLAN:   Tobacco abuse History of tobacco abuse having smoked 50 pack years or more and quit one to 2 years ago.  Peripheral arterial disease ([place]) Kathleen Filipovic was referred by Dr. Dellia Nims at the wound care center for peripheral vascular evaluation. She has a history of hypertension, hyperlipidemia, diabetes, discontinued tobacco abuse having smoked 50+ pack years and chronic renal insufficiency with creatinines in the 3 range. She's been treated for pretibial ulcers. Recent Dopplers performed 02/17/16 revealed ABIs in the 0.6 range bilaterally with high frequency signals in both SFAs. She basically is nonambulatory and denies claudication. She states in her house and walks minimally with a walker and cane. Given her lack of mobility and the fairly unimpressive nature of her pretibial ulcers and in light of her chronic renal insufficiency I do not feel that an invasive approach is warranted at this time. We will continue to treat her aggressively at the [place]. and I will see her back in 3 months for follow-up.      Lorretta Harp MD FACP,FACC,FAHA, [facility] 02/23/2016 12:26 PM

## 2016-02-23 NOTE — Patient Instructions (Signed)
Medication Instructions:  Your physician recommends that you continue on your current medications as directed. Please refer to the Current Medication list given to you today.   Follow-Up: Your physician recommends that you schedule a follow-up appointment in: [place].   Any Other Special Instructions Will Be Listed Below (If Applicable).     If you need a refill on your cardiac medications before your next appointment, please call your pharmacy.

## 2016-02-23 NOTE — Assessment & Plan Note (Signed)
Kathleen [name] was referred by Dr. Dellia Nims at the wound care center for peripheral vascular evaluation. She has a history of hypertension, hyperlipidemia, diabetes, discontinued tobacco abuse having smoked 50+ pack years and chronic renal insufficiency with creatinines in the 3 range. She's been treated for pretibial ulcers. Recent Dopplers performed 02/17/16 revealed ABIs in the 0.6 range bilaterally with high frequency signals in both SFAs. She basically is nonambulatory and denies claudication. She states in her house and walks minimally with a walker and cane. Given her lack of mobility and the fairly unimpressive nature of her pretibial ulcers and in light of her chronic renal insufficiency I do not feel that an invasive approach is warranted at this time. We will continue to treat her aggressively at the [place]. and I will see her back in 3 months for follow-up.

## 2016-02-23 NOTE — Assessment & Plan Note (Signed)
History of tobacco abuse having smoked 50 pack years or more and quit one to 2 years ago.

## 2016-02-26 DIAGNOSIS — I872 Venous insufficiency (chronic) (peripheral): Secondary | ICD-10-CM | POA: Diagnosis not present

## 2016-02-26 DIAGNOSIS — S80821A Blister (nonthermal), right lower leg, initial encounter: Secondary | ICD-10-CM | POA: Diagnosis not present

## 2016-02-26 DIAGNOSIS — E1151 Type 2 diabetes mellitus with diabetic peripheral angiopathy without gangrene: Secondary | ICD-10-CM | POA: Diagnosis not present

## 2016-02-26 DIAGNOSIS — I2510 Atherosclerotic heart disease of native coronary artery without angina pectoris: Secondary | ICD-10-CM | POA: Diagnosis not present

## 2016-02-26 DIAGNOSIS — H409 Unspecified glaucoma: Secondary | ICD-10-CM | POA: Diagnosis not present

## 2016-02-26 DIAGNOSIS — I110 Hypertensive heart disease with heart failure: Secondary | ICD-10-CM | POA: Diagnosis not present

## 2016-02-26 DIAGNOSIS — I509 Heart failure, unspecified: Secondary | ICD-10-CM | POA: Diagnosis not present

## 2016-02-26 DIAGNOSIS — L97821 Non-pressure chronic ulcer of other part of left lower leg limited to breakdown of skin: Secondary | ICD-10-CM | POA: Diagnosis not present

## 2016-02-26 DIAGNOSIS — E11622 Type 2 diabetes mellitus with other skin ulcer: Secondary | ICD-10-CM | POA: Diagnosis not present

## 2016-03-04 DIAGNOSIS — I110 Hypertensive heart disease with heart failure: Secondary | ICD-10-CM | POA: Diagnosis not present

## 2016-03-04 DIAGNOSIS — I2510 Atherosclerotic heart disease of native coronary artery without angina pectoris: Secondary | ICD-10-CM | POA: Diagnosis not present

## 2016-03-04 DIAGNOSIS — E1151 Type 2 diabetes mellitus with diabetic peripheral angiopathy without gangrene: Secondary | ICD-10-CM | POA: Diagnosis not present

## 2016-03-04 DIAGNOSIS — L97821 Non-pressure chronic ulcer of other part of left lower leg limited to breakdown of skin: Secondary | ICD-10-CM | POA: Diagnosis not present

## 2016-03-04 DIAGNOSIS — H409 Unspecified glaucoma: Secondary | ICD-10-CM | POA: Diagnosis not present

## 2016-03-04 DIAGNOSIS — I509 Heart failure, unspecified: Secondary | ICD-10-CM | POA: Diagnosis not present

## 2016-03-04 DIAGNOSIS — S80821A Blister (nonthermal), right lower leg, initial encounter: Secondary | ICD-10-CM | POA: Diagnosis not present

## 2016-03-04 DIAGNOSIS — E11622 Type 2 diabetes mellitus with other skin ulcer: Secondary | ICD-10-CM | POA: Diagnosis not present

## 2016-03-04 DIAGNOSIS — I872 Venous insufficiency (chronic) (peripheral): Secondary | ICD-10-CM | POA: Diagnosis not present

## 2016-03-11 ENCOUNTER — Encounter (HOSPITAL_BASED_OUTPATIENT_CLINIC_OR_DEPARTMENT_OTHER): Payer: Medicare Other | Attending: Internal Medicine

## 2016-03-11 DIAGNOSIS — I872 Venous insufficiency (chronic) (peripheral): Secondary | ICD-10-CM | POA: Diagnosis not present

## 2016-03-11 DIAGNOSIS — Z8631 Personal history of diabetic foot ulcer: Secondary | ICD-10-CM | POA: Diagnosis not present

## 2016-03-11 DIAGNOSIS — X35XXXA Volcanic eruption, initial encounter: Secondary | ICD-10-CM | POA: Diagnosis not present

## 2016-03-11 DIAGNOSIS — I110 Hypertensive heart disease with heart failure: Secondary | ICD-10-CM | POA: Insufficient documentation

## 2016-03-11 DIAGNOSIS — E119 Type 2 diabetes mellitus without complications: Secondary | ICD-10-CM | POA: Insufficient documentation

## 2016-03-11 DIAGNOSIS — Z872 Personal history of diseases of the skin and subcutaneous tissue: Secondary | ICD-10-CM | POA: Diagnosis not present

## 2016-03-11 DIAGNOSIS — I509 Heart failure, unspecified: Secondary | ICD-10-CM | POA: Insufficient documentation

## 2016-03-11 DIAGNOSIS — E11622 Type 2 diabetes mellitus with other skin ulcer: Secondary | ICD-10-CM | POA: Diagnosis present

## 2016-03-11 DIAGNOSIS — I2510 Atherosclerotic heart disease of native coronary artery without angina pectoris: Secondary | ICD-10-CM | POA: Insufficient documentation

## 2016-03-11 DIAGNOSIS — Z09 Encounter for follow-up examination after completed treatment for conditions other than malignant neoplasm: Secondary | ICD-10-CM | POA: Diagnosis not present

## 2016-03-18 ENCOUNTER — Other Ambulatory Visit: Payer: Self-pay | Admitting: Cardiovascular Disease

## 2016-03-22 ENCOUNTER — Encounter: Payer: Medicare Other | Admitting: Cardiovascular Disease

## 2016-03-22 DIAGNOSIS — I10 Essential (primary) hypertension: Secondary | ICD-10-CM | POA: Diagnosis not present

## 2016-03-22 DIAGNOSIS — E559 Vitamin D deficiency, unspecified: Secondary | ICD-10-CM | POA: Diagnosis not present

## 2016-04-09 ENCOUNTER — Other Ambulatory Visit: Payer: Self-pay | Admitting: Cardiovascular Disease

## 2016-04-14 DIAGNOSIS — Z720 Tobacco use: Secondary | ICD-10-CM | POA: Diagnosis not present

## 2016-04-14 DIAGNOSIS — E119 Type 2 diabetes mellitus without complications: Secondary | ICD-10-CM | POA: Diagnosis not present

## 2016-04-14 DIAGNOSIS — I10 Essential (primary) hypertension: Secondary | ICD-10-CM | POA: Diagnosis not present

## 2016-04-14 DIAGNOSIS — N289 Disorder of kidney and ureter, unspecified: Secondary | ICD-10-CM | POA: Diagnosis not present

## 2016-04-14 DIAGNOSIS — E785 Hyperlipidemia, unspecified: Secondary | ICD-10-CM | POA: Diagnosis not present

## 2016-04-21 DIAGNOSIS — E785 Hyperlipidemia, unspecified: Secondary | ICD-10-CM | POA: Diagnosis not present

## 2016-04-21 DIAGNOSIS — N289 Disorder of kidney and ureter, unspecified: Secondary | ICD-10-CM | POA: Diagnosis not present

## 2016-04-21 DIAGNOSIS — I10 Essential (primary) hypertension: Secondary | ICD-10-CM | POA: Diagnosis not present

## 2016-04-21 DIAGNOSIS — E119 Type 2 diabetes mellitus without complications: Secondary | ICD-10-CM | POA: Diagnosis not present

## 2016-04-21 DIAGNOSIS — Z720 Tobacco use: Secondary | ICD-10-CM | POA: Diagnosis not present

## 2016-04-22 DIAGNOSIS — I429 Cardiomyopathy, unspecified: Secondary | ICD-10-CM | POA: Diagnosis not present

## 2016-04-22 DIAGNOSIS — L309 Dermatitis, unspecified: Secondary | ICD-10-CM | POA: Diagnosis not present

## 2016-04-22 DIAGNOSIS — Z4800 Encounter for change or removal of nonsurgical wound dressing: Secondary | ICD-10-CM | POA: Diagnosis not present

## 2016-04-22 DIAGNOSIS — N183 Chronic kidney disease, stage 3 (moderate): Secondary | ICD-10-CM | POA: Diagnosis not present

## 2016-04-22 DIAGNOSIS — E1136 Type 2 diabetes mellitus with diabetic cataract: Secondary | ICD-10-CM | POA: Diagnosis not present

## 2016-04-22 DIAGNOSIS — E1140 Type 2 diabetes mellitus with diabetic neuropathy, unspecified: Secondary | ICD-10-CM | POA: Diagnosis not present

## 2016-04-22 DIAGNOSIS — Z720 Tobacco use: Secondary | ICD-10-CM | POA: Diagnosis not present

## 2016-04-22 DIAGNOSIS — Z794 Long term (current) use of insulin: Secondary | ICD-10-CM | POA: Diagnosis not present

## 2016-04-22 DIAGNOSIS — H409 Unspecified glaucoma: Secondary | ICD-10-CM | POA: Diagnosis not present

## 2016-04-22 DIAGNOSIS — L139 Bullous disorder, unspecified: Secondary | ICD-10-CM | POA: Diagnosis not present

## 2016-04-22 DIAGNOSIS — I129 Hypertensive chronic kidney disease with stage 1 through stage 4 chronic kidney disease, or unspecified chronic kidney disease: Secondary | ICD-10-CM | POA: Diagnosis not present

## 2016-04-22 DIAGNOSIS — E1122 Type 2 diabetes mellitus with diabetic chronic kidney disease: Secondary | ICD-10-CM | POA: Diagnosis not present

## 2016-04-27 DIAGNOSIS — E1122 Type 2 diabetes mellitus with diabetic chronic kidney disease: Secondary | ICD-10-CM | POA: Diagnosis not present

## 2016-04-27 DIAGNOSIS — Z794 Long term (current) use of insulin: Secondary | ICD-10-CM | POA: Diagnosis not present

## 2016-04-27 DIAGNOSIS — E1136 Type 2 diabetes mellitus with diabetic cataract: Secondary | ICD-10-CM | POA: Diagnosis not present

## 2016-04-27 DIAGNOSIS — L309 Dermatitis, unspecified: Secondary | ICD-10-CM | POA: Diagnosis not present

## 2016-04-27 DIAGNOSIS — I129 Hypertensive chronic kidney disease with stage 1 through stage 4 chronic kidney disease, or unspecified chronic kidney disease: Secondary | ICD-10-CM | POA: Diagnosis not present

## 2016-04-27 DIAGNOSIS — L139 Bullous disorder, unspecified: Secondary | ICD-10-CM | POA: Diagnosis not present

## 2016-04-27 DIAGNOSIS — I429 Cardiomyopathy, unspecified: Secondary | ICD-10-CM | POA: Diagnosis not present

## 2016-04-27 DIAGNOSIS — Z720 Tobacco use: Secondary | ICD-10-CM | POA: Diagnosis not present

## 2016-04-27 DIAGNOSIS — H409 Unspecified glaucoma: Secondary | ICD-10-CM | POA: Diagnosis not present

## 2016-04-27 DIAGNOSIS — N183 Chronic kidney disease, stage 3 (moderate): Secondary | ICD-10-CM | POA: Diagnosis not present

## 2016-04-27 DIAGNOSIS — Z4800 Encounter for change or removal of nonsurgical wound dressing: Secondary | ICD-10-CM | POA: Diagnosis not present

## 2016-04-27 DIAGNOSIS — E1140 Type 2 diabetes mellitus with diabetic neuropathy, unspecified: Secondary | ICD-10-CM | POA: Diagnosis not present

## 2016-04-29 DIAGNOSIS — Z4800 Encounter for change or removal of nonsurgical wound dressing: Secondary | ICD-10-CM | POA: Diagnosis not present

## 2016-04-29 DIAGNOSIS — I129 Hypertensive chronic kidney disease with stage 1 through stage 4 chronic kidney disease, or unspecified chronic kidney disease: Secondary | ICD-10-CM | POA: Diagnosis not present

## 2016-04-29 DIAGNOSIS — H409 Unspecified glaucoma: Secondary | ICD-10-CM | POA: Diagnosis not present

## 2016-04-29 DIAGNOSIS — L309 Dermatitis, unspecified: Secondary | ICD-10-CM | POA: Diagnosis not present

## 2016-04-29 DIAGNOSIS — E1122 Type 2 diabetes mellitus with diabetic chronic kidney disease: Secondary | ICD-10-CM | POA: Diagnosis not present

## 2016-04-29 DIAGNOSIS — N183 Chronic kidney disease, stage 3 (moderate): Secondary | ICD-10-CM | POA: Diagnosis not present

## 2016-04-29 DIAGNOSIS — L139 Bullous disorder, unspecified: Secondary | ICD-10-CM | POA: Diagnosis not present

## 2016-04-29 DIAGNOSIS — I429 Cardiomyopathy, unspecified: Secondary | ICD-10-CM | POA: Diagnosis not present

## 2016-04-29 DIAGNOSIS — Z720 Tobacco use: Secondary | ICD-10-CM | POA: Diagnosis not present

## 2016-04-29 DIAGNOSIS — E1140 Type 2 diabetes mellitus with diabetic neuropathy, unspecified: Secondary | ICD-10-CM | POA: Diagnosis not present

## 2016-04-29 DIAGNOSIS — Z794 Long term (current) use of insulin: Secondary | ICD-10-CM | POA: Diagnosis not present

## 2016-04-29 DIAGNOSIS — E1136 Type 2 diabetes mellitus with diabetic cataract: Secondary | ICD-10-CM | POA: Diagnosis not present

## 2016-05-03 DIAGNOSIS — Z794 Long term (current) use of insulin: Secondary | ICD-10-CM | POA: Diagnosis not present

## 2016-05-03 DIAGNOSIS — E1136 Type 2 diabetes mellitus with diabetic cataract: Secondary | ICD-10-CM | POA: Diagnosis not present

## 2016-05-03 DIAGNOSIS — L139 Bullous disorder, unspecified: Secondary | ICD-10-CM | POA: Diagnosis not present

## 2016-05-03 DIAGNOSIS — L309 Dermatitis, unspecified: Secondary | ICD-10-CM | POA: Diagnosis not present

## 2016-05-03 DIAGNOSIS — Z4800 Encounter for change or removal of nonsurgical wound dressing: Secondary | ICD-10-CM | POA: Diagnosis not present

## 2016-05-03 DIAGNOSIS — E1122 Type 2 diabetes mellitus with diabetic chronic kidney disease: Secondary | ICD-10-CM | POA: Diagnosis not present

## 2016-05-03 DIAGNOSIS — H409 Unspecified glaucoma: Secondary | ICD-10-CM | POA: Diagnosis not present

## 2016-05-03 DIAGNOSIS — E1140 Type 2 diabetes mellitus with diabetic neuropathy, unspecified: Secondary | ICD-10-CM | POA: Diagnosis not present

## 2016-05-03 DIAGNOSIS — N183 Chronic kidney disease, stage 3 (moderate): Secondary | ICD-10-CM | POA: Diagnosis not present

## 2016-05-03 DIAGNOSIS — I429 Cardiomyopathy, unspecified: Secondary | ICD-10-CM | POA: Diagnosis not present

## 2016-05-03 DIAGNOSIS — I129 Hypertensive chronic kidney disease with stage 1 through stage 4 chronic kidney disease, or unspecified chronic kidney disease: Secondary | ICD-10-CM | POA: Diagnosis not present

## 2016-05-03 DIAGNOSIS — Z720 Tobacco use: Secondary | ICD-10-CM | POA: Diagnosis not present

## 2016-05-05 DIAGNOSIS — H409 Unspecified glaucoma: Secondary | ICD-10-CM | POA: Diagnosis not present

## 2016-05-05 DIAGNOSIS — E1140 Type 2 diabetes mellitus with diabetic neuropathy, unspecified: Secondary | ICD-10-CM | POA: Diagnosis not present

## 2016-05-05 DIAGNOSIS — E559 Vitamin D deficiency, unspecified: Secondary | ICD-10-CM | POA: Diagnosis not present

## 2016-05-05 DIAGNOSIS — L139 Bullous disorder, unspecified: Secondary | ICD-10-CM | POA: Diagnosis not present

## 2016-05-05 DIAGNOSIS — E1122 Type 2 diabetes mellitus with diabetic chronic kidney disease: Secondary | ICD-10-CM | POA: Diagnosis not present

## 2016-05-05 DIAGNOSIS — E1136 Type 2 diabetes mellitus with diabetic cataract: Secondary | ICD-10-CM | POA: Diagnosis not present

## 2016-05-05 DIAGNOSIS — I129 Hypertensive chronic kidney disease with stage 1 through stage 4 chronic kidney disease, or unspecified chronic kidney disease: Secondary | ICD-10-CM | POA: Diagnosis not present

## 2016-05-05 DIAGNOSIS — N183 Chronic kidney disease, stage 3 (moderate): Secondary | ICD-10-CM | POA: Diagnosis not present

## 2016-05-05 DIAGNOSIS — I429 Cardiomyopathy, unspecified: Secondary | ICD-10-CM | POA: Diagnosis not present

## 2016-05-05 DIAGNOSIS — Z720 Tobacco use: Secondary | ICD-10-CM | POA: Diagnosis not present

## 2016-05-05 DIAGNOSIS — Z794 Long term (current) use of insulin: Secondary | ICD-10-CM | POA: Diagnosis not present

## 2016-05-05 DIAGNOSIS — I10 Essential (primary) hypertension: Secondary | ICD-10-CM | POA: Diagnosis not present

## 2016-05-05 DIAGNOSIS — L309 Dermatitis, unspecified: Secondary | ICD-10-CM | POA: Diagnosis not present

## 2016-05-05 DIAGNOSIS — Z4800 Encounter for change or removal of nonsurgical wound dressing: Secondary | ICD-10-CM | POA: Diagnosis not present

## 2016-05-09 DIAGNOSIS — L309 Dermatitis, unspecified: Secondary | ICD-10-CM | POA: Diagnosis not present

## 2016-05-09 DIAGNOSIS — I10 Essential (primary) hypertension: Secondary | ICD-10-CM | POA: Diagnosis not present

## 2016-05-09 DIAGNOSIS — N184 Chronic kidney disease, stage 4 (severe): Secondary | ICD-10-CM | POA: Diagnosis not present

## 2016-05-09 DIAGNOSIS — I509 Heart failure, unspecified: Secondary | ICD-10-CM | POA: Diagnosis not present

## 2016-05-10 DIAGNOSIS — H409 Unspecified glaucoma: Secondary | ICD-10-CM | POA: Diagnosis not present

## 2016-05-10 DIAGNOSIS — I429 Cardiomyopathy, unspecified: Secondary | ICD-10-CM | POA: Diagnosis not present

## 2016-05-10 DIAGNOSIS — E1122 Type 2 diabetes mellitus with diabetic chronic kidney disease: Secondary | ICD-10-CM | POA: Diagnosis not present

## 2016-05-10 DIAGNOSIS — E1140 Type 2 diabetes mellitus with diabetic neuropathy, unspecified: Secondary | ICD-10-CM | POA: Diagnosis not present

## 2016-05-10 DIAGNOSIS — N183 Chronic kidney disease, stage 3 (moderate): Secondary | ICD-10-CM | POA: Diagnosis not present

## 2016-05-10 DIAGNOSIS — L309 Dermatitis, unspecified: Secondary | ICD-10-CM | POA: Diagnosis not present

## 2016-05-10 DIAGNOSIS — L139 Bullous disorder, unspecified: Secondary | ICD-10-CM | POA: Diagnosis not present

## 2016-05-10 DIAGNOSIS — Z794 Long term (current) use of insulin: Secondary | ICD-10-CM | POA: Diagnosis not present

## 2016-05-10 DIAGNOSIS — E1136 Type 2 diabetes mellitus with diabetic cataract: Secondary | ICD-10-CM | POA: Diagnosis not present

## 2016-05-10 DIAGNOSIS — Z720 Tobacco use: Secondary | ICD-10-CM | POA: Diagnosis not present

## 2016-05-10 DIAGNOSIS — I129 Hypertensive chronic kidney disease with stage 1 through stage 4 chronic kidney disease, or unspecified chronic kidney disease: Secondary | ICD-10-CM | POA: Diagnosis not present

## 2016-05-10 DIAGNOSIS — Z4800 Encounter for change or removal of nonsurgical wound dressing: Secondary | ICD-10-CM | POA: Diagnosis not present

## 2016-05-12 DIAGNOSIS — E1136 Type 2 diabetes mellitus with diabetic cataract: Secondary | ICD-10-CM | POA: Diagnosis not present

## 2016-05-12 DIAGNOSIS — H409 Unspecified glaucoma: Secondary | ICD-10-CM | POA: Diagnosis not present

## 2016-05-12 DIAGNOSIS — Z794 Long term (current) use of insulin: Secondary | ICD-10-CM | POA: Diagnosis not present

## 2016-05-12 DIAGNOSIS — E1122 Type 2 diabetes mellitus with diabetic chronic kidney disease: Secondary | ICD-10-CM | POA: Diagnosis not present

## 2016-05-12 DIAGNOSIS — N183 Chronic kidney disease, stage 3 (moderate): Secondary | ICD-10-CM | POA: Diagnosis not present

## 2016-05-12 DIAGNOSIS — L139 Bullous disorder, unspecified: Secondary | ICD-10-CM | POA: Diagnosis not present

## 2016-05-12 DIAGNOSIS — I429 Cardiomyopathy, unspecified: Secondary | ICD-10-CM | POA: Diagnosis not present

## 2016-05-12 DIAGNOSIS — Z4800 Encounter for change or removal of nonsurgical wound dressing: Secondary | ICD-10-CM | POA: Diagnosis not present

## 2016-05-12 DIAGNOSIS — L309 Dermatitis, unspecified: Secondary | ICD-10-CM | POA: Diagnosis not present

## 2016-05-12 DIAGNOSIS — I129 Hypertensive chronic kidney disease with stage 1 through stage 4 chronic kidney disease, or unspecified chronic kidney disease: Secondary | ICD-10-CM | POA: Diagnosis not present

## 2016-05-12 DIAGNOSIS — E1140 Type 2 diabetes mellitus with diabetic neuropathy, unspecified: Secondary | ICD-10-CM | POA: Diagnosis not present

## 2016-05-12 DIAGNOSIS — Z720 Tobacco use: Secondary | ICD-10-CM | POA: Diagnosis not present

## 2016-05-17 DIAGNOSIS — L309 Dermatitis, unspecified: Secondary | ICD-10-CM | POA: Diagnosis not present

## 2016-05-17 DIAGNOSIS — I429 Cardiomyopathy, unspecified: Secondary | ICD-10-CM | POA: Diagnosis not present

## 2016-05-17 DIAGNOSIS — H409 Unspecified glaucoma: Secondary | ICD-10-CM | POA: Diagnosis not present

## 2016-05-17 DIAGNOSIS — E1136 Type 2 diabetes mellitus with diabetic cataract: Secondary | ICD-10-CM | POA: Diagnosis not present

## 2016-05-17 DIAGNOSIS — N183 Chronic kidney disease, stage 3 (moderate): Secondary | ICD-10-CM | POA: Diagnosis not present

## 2016-05-17 DIAGNOSIS — I129 Hypertensive chronic kidney disease with stage 1 through stage 4 chronic kidney disease, or unspecified chronic kidney disease: Secondary | ICD-10-CM | POA: Diagnosis not present

## 2016-05-17 DIAGNOSIS — E1140 Type 2 diabetes mellitus with diabetic neuropathy, unspecified: Secondary | ICD-10-CM | POA: Diagnosis not present

## 2016-05-17 DIAGNOSIS — E1122 Type 2 diabetes mellitus with diabetic chronic kidney disease: Secondary | ICD-10-CM | POA: Diagnosis not present

## 2016-05-17 DIAGNOSIS — Z720 Tobacco use: Secondary | ICD-10-CM | POA: Diagnosis not present

## 2016-05-17 DIAGNOSIS — Z794 Long term (current) use of insulin: Secondary | ICD-10-CM | POA: Diagnosis not present

## 2016-05-17 DIAGNOSIS — Z4800 Encounter for change or removal of nonsurgical wound dressing: Secondary | ICD-10-CM | POA: Diagnosis not present

## 2016-05-17 DIAGNOSIS — L139 Bullous disorder, unspecified: Secondary | ICD-10-CM | POA: Diagnosis not present

## 2016-05-19 DIAGNOSIS — E1136 Type 2 diabetes mellitus with diabetic cataract: Secondary | ICD-10-CM | POA: Diagnosis not present

## 2016-05-19 DIAGNOSIS — E1140 Type 2 diabetes mellitus with diabetic neuropathy, unspecified: Secondary | ICD-10-CM | POA: Diagnosis not present

## 2016-05-19 DIAGNOSIS — Z720 Tobacco use: Secondary | ICD-10-CM | POA: Diagnosis not present

## 2016-05-19 DIAGNOSIS — I429 Cardiomyopathy, unspecified: Secondary | ICD-10-CM | POA: Diagnosis not present

## 2016-05-19 DIAGNOSIS — E1122 Type 2 diabetes mellitus with diabetic chronic kidney disease: Secondary | ICD-10-CM | POA: Diagnosis not present

## 2016-05-19 DIAGNOSIS — I129 Hypertensive chronic kidney disease with stage 1 through stage 4 chronic kidney disease, or unspecified chronic kidney disease: Secondary | ICD-10-CM | POA: Diagnosis not present

## 2016-05-19 DIAGNOSIS — Z4800 Encounter for change or removal of nonsurgical wound dressing: Secondary | ICD-10-CM | POA: Diagnosis not present

## 2016-05-19 DIAGNOSIS — H409 Unspecified glaucoma: Secondary | ICD-10-CM | POA: Diagnosis not present

## 2016-05-19 DIAGNOSIS — L139 Bullous disorder, unspecified: Secondary | ICD-10-CM | POA: Diagnosis not present

## 2016-05-19 DIAGNOSIS — L309 Dermatitis, unspecified: Secondary | ICD-10-CM | POA: Diagnosis not present

## 2016-05-19 DIAGNOSIS — N183 Chronic kidney disease, stage 3 (moderate): Secondary | ICD-10-CM | POA: Diagnosis not present

## 2016-05-19 DIAGNOSIS — Z794 Long term (current) use of insulin: Secondary | ICD-10-CM | POA: Diagnosis not present

## 2016-05-23 DIAGNOSIS — L309 Dermatitis, unspecified: Secondary | ICD-10-CM | POA: Diagnosis not present

## 2016-05-23 DIAGNOSIS — Z720 Tobacco use: Secondary | ICD-10-CM | POA: Diagnosis not present

## 2016-05-23 DIAGNOSIS — Z4800 Encounter for change or removal of nonsurgical wound dressing: Secondary | ICD-10-CM | POA: Diagnosis not present

## 2016-05-23 DIAGNOSIS — N183 Chronic kidney disease, stage 3 (moderate): Secondary | ICD-10-CM | POA: Diagnosis not present

## 2016-05-23 DIAGNOSIS — E1140 Type 2 diabetes mellitus with diabetic neuropathy, unspecified: Secondary | ICD-10-CM | POA: Diagnosis not present

## 2016-05-23 DIAGNOSIS — H409 Unspecified glaucoma: Secondary | ICD-10-CM | POA: Diagnosis not present

## 2016-05-23 DIAGNOSIS — Z794 Long term (current) use of insulin: Secondary | ICD-10-CM | POA: Diagnosis not present

## 2016-05-23 DIAGNOSIS — L139 Bullous disorder, unspecified: Secondary | ICD-10-CM | POA: Diagnosis not present

## 2016-05-23 DIAGNOSIS — E1122 Type 2 diabetes mellitus with diabetic chronic kidney disease: Secondary | ICD-10-CM | POA: Diagnosis not present

## 2016-05-23 DIAGNOSIS — I129 Hypertensive chronic kidney disease with stage 1 through stage 4 chronic kidney disease, or unspecified chronic kidney disease: Secondary | ICD-10-CM | POA: Diagnosis not present

## 2016-05-23 DIAGNOSIS — E1136 Type 2 diabetes mellitus with diabetic cataract: Secondary | ICD-10-CM | POA: Diagnosis not present

## 2016-05-23 DIAGNOSIS — I429 Cardiomyopathy, unspecified: Secondary | ICD-10-CM | POA: Diagnosis not present

## 2016-05-26 DIAGNOSIS — N184 Chronic kidney disease, stage 4 (severe): Secondary | ICD-10-CM | POA: Diagnosis not present

## 2016-05-27 DIAGNOSIS — Z4800 Encounter for change or removal of nonsurgical wound dressing: Secondary | ICD-10-CM | POA: Diagnosis not present

## 2016-05-27 DIAGNOSIS — L139 Bullous disorder, unspecified: Secondary | ICD-10-CM | POA: Diagnosis not present

## 2016-05-27 DIAGNOSIS — I129 Hypertensive chronic kidney disease with stage 1 through stage 4 chronic kidney disease, or unspecified chronic kidney disease: Secondary | ICD-10-CM | POA: Diagnosis not present

## 2016-05-27 DIAGNOSIS — N183 Chronic kidney disease, stage 3 (moderate): Secondary | ICD-10-CM | POA: Diagnosis not present

## 2016-05-27 DIAGNOSIS — Z794 Long term (current) use of insulin: Secondary | ICD-10-CM | POA: Diagnosis not present

## 2016-05-27 DIAGNOSIS — E1140 Type 2 diabetes mellitus with diabetic neuropathy, unspecified: Secondary | ICD-10-CM | POA: Diagnosis not present

## 2016-05-27 DIAGNOSIS — H409 Unspecified glaucoma: Secondary | ICD-10-CM | POA: Diagnosis not present

## 2016-05-27 DIAGNOSIS — Z720 Tobacco use: Secondary | ICD-10-CM | POA: Diagnosis not present

## 2016-05-27 DIAGNOSIS — L309 Dermatitis, unspecified: Secondary | ICD-10-CM | POA: Diagnosis not present

## 2016-05-27 DIAGNOSIS — E1136 Type 2 diabetes mellitus with diabetic cataract: Secondary | ICD-10-CM | POA: Diagnosis not present

## 2016-05-27 DIAGNOSIS — I429 Cardiomyopathy, unspecified: Secondary | ICD-10-CM | POA: Diagnosis not present

## 2016-05-27 DIAGNOSIS — E1122 Type 2 diabetes mellitus with diabetic chronic kidney disease: Secondary | ICD-10-CM | POA: Diagnosis not present

## 2016-05-31 DIAGNOSIS — Z01118 Encounter for examination of ears and hearing with other abnormal findings: Secondary | ICD-10-CM | POA: Diagnosis not present

## 2016-05-31 DIAGNOSIS — I10 Essential (primary) hypertension: Secondary | ICD-10-CM | POA: Diagnosis not present

## 2016-05-31 DIAGNOSIS — Z23 Encounter for immunization: Secondary | ICD-10-CM | POA: Diagnosis not present

## 2016-05-31 DIAGNOSIS — Z0000 Encounter for general adult medical examination without abnormal findings: Secondary | ICD-10-CM | POA: Diagnosis not present

## 2016-05-31 DIAGNOSIS — E119 Type 2 diabetes mellitus without complications: Secondary | ICD-10-CM | POA: Diagnosis not present

## 2016-05-31 DIAGNOSIS — H538 Other visual disturbances: Secondary | ICD-10-CM | POA: Diagnosis not present

## 2016-05-31 DIAGNOSIS — N184 Chronic kidney disease, stage 4 (severe): Secondary | ICD-10-CM | POA: Diagnosis not present

## 2016-06-02 ENCOUNTER — Other Ambulatory Visit: Payer: Self-pay | Admitting: *Deleted

## 2016-06-02 DIAGNOSIS — E559 Vitamin D deficiency, unspecified: Secondary | ICD-10-CM | POA: Diagnosis not present

## 2016-06-02 DIAGNOSIS — I10 Essential (primary) hypertension: Secondary | ICD-10-CM | POA: Diagnosis not present

## 2016-06-02 MED ORDER — ISOSORBIDE MONONITRATE ER 30 MG PO TB24: 30.0000 mg | ORAL_TABLET | Freq: Every day | ORAL | 2 refills | Status: DC

## 2016-06-07 DIAGNOSIS — N6320 Unspecified lump in the left breast, unspecified quadrant: Secondary | ICD-10-CM | POA: Diagnosis not present

## 2016-06-07 DIAGNOSIS — M8589 Other specified disorders of bone density and structure, multiple sites: Secondary | ICD-10-CM | POA: Diagnosis not present

## 2016-06-07 DIAGNOSIS — N810 Urethrocele: Secondary | ICD-10-CM | POA: Diagnosis not present

## 2016-06-07 DIAGNOSIS — M810 Age-related osteoporosis without current pathological fracture: Secondary | ICD-10-CM | POA: Diagnosis not present

## 2016-06-10 DIAGNOSIS — N184 Chronic kidney disease, stage 4 (severe): Secondary | ICD-10-CM | POA: Diagnosis not present

## 2016-06-15 DIAGNOSIS — I509 Heart failure, unspecified: Secondary | ICD-10-CM | POA: Diagnosis not present

## 2016-06-15 DIAGNOSIS — I272 Pulmonary hypertension, unspecified: Secondary | ICD-10-CM | POA: Diagnosis not present

## 2016-06-15 DIAGNOSIS — I10 Essential (primary) hypertension: Secondary | ICD-10-CM | POA: Diagnosis not present

## 2016-06-15 DIAGNOSIS — N184 Chronic kidney disease, stage 4 (severe): Secondary | ICD-10-CM | POA: Diagnosis not present

## 2016-06-20 DIAGNOSIS — M25562 Pain in left knee: Secondary | ICD-10-CM | POA: Diagnosis not present

## 2016-06-20 DIAGNOSIS — E785 Hyperlipidemia, unspecified: Secondary | ICD-10-CM | POA: Diagnosis not present

## 2016-06-20 DIAGNOSIS — E119 Type 2 diabetes mellitus without complications: Secondary | ICD-10-CM | POA: Diagnosis not present

## 2016-06-20 DIAGNOSIS — R1012 Left upper quadrant pain: Secondary | ICD-10-CM | POA: Diagnosis not present

## 2016-06-20 DIAGNOSIS — I10 Essential (primary) hypertension: Secondary | ICD-10-CM | POA: Diagnosis not present

## 2016-06-20 DIAGNOSIS — Z720 Tobacco use: Secondary | ICD-10-CM | POA: Diagnosis not present

## 2016-06-20 DIAGNOSIS — N289 Disorder of kidney and ureter, unspecified: Secondary | ICD-10-CM | POA: Diagnosis not present

## 2016-06-20 DIAGNOSIS — M79645 Pain in left finger(s): Secondary | ICD-10-CM | POA: Diagnosis not present

## 2016-06-21 DIAGNOSIS — M170 Bilateral primary osteoarthritis of knee: Secondary | ICD-10-CM | POA: Diagnosis not present

## 2016-06-29 DIAGNOSIS — E559 Vitamin D deficiency, unspecified: Secondary | ICD-10-CM | POA: Diagnosis not present

## 2016-06-29 DIAGNOSIS — I10 Essential (primary) hypertension: Secondary | ICD-10-CM | POA: Diagnosis not present

## 2016-07-05 DIAGNOSIS — M170 Bilateral primary osteoarthritis of knee: Secondary | ICD-10-CM | POA: Diagnosis not present

## 2016-07-11 ENCOUNTER — Telehealth: Payer: Self-pay | Admitting: Cardiovascular Disease

## 2016-07-11 DIAGNOSIS — I10 Essential (primary) hypertension: Secondary | ICD-10-CM | POA: Diagnosis not present

## 2016-07-11 DIAGNOSIS — E785 Hyperlipidemia, unspecified: Secondary | ICD-10-CM | POA: Diagnosis not present

## 2016-07-11 DIAGNOSIS — Z720 Tobacco use: Secondary | ICD-10-CM | POA: Diagnosis not present

## 2016-07-11 DIAGNOSIS — N289 Disorder of kidney and ureter, unspecified: Secondary | ICD-10-CM | POA: Diagnosis not present

## 2016-07-11 DIAGNOSIS — E119 Type 2 diabetes mellitus without complications: Secondary | ICD-10-CM | POA: Diagnosis not present

## 2016-07-11 NOTE — Telephone Encounter (Signed)
Requesting surgical clearance:  1. Type of surgery: Left Total Knee Replacement  2. Surgeon: Dr. Earlie Server  3.Surgical Date:  Pending clearance  4. Any medications that need to be held?   5. CAD: Yes  6. I will defer to:  Dr. Pearla Dubonnet Information:   [facility]  Phone: [phone] Fax:  [phone]

## 2016-07-12 NOTE — Telephone Encounter (Signed)
Routed to Dr. Johnsie Cancel for clearance.

## 2016-07-12 NOTE — Telephone Encounter (Signed)
She is a cardiology patient of Dr. Kyla Balzarine  Who will need to provide clearance

## 2016-07-13 ENCOUNTER — Encounter: Payer: Self-pay | Admitting: Cardiovascular Disease

## 2016-07-13 NOTE — Telephone Encounter (Signed)
Will fax to [place].

## 2016-07-13 NOTE — Telephone Encounter (Signed)
Clearance faxed to number provided via [employer]. 

## 2016-07-13 NOTE — Telephone Encounter (Signed)
Ok to have TKR with Dr French Ana

## 2016-07-28 DIAGNOSIS — I10 Essential (primary) hypertension: Secondary | ICD-10-CM | POA: Diagnosis not present

## 2016-07-28 DIAGNOSIS — E559 Vitamin D deficiency, unspecified: Secondary | ICD-10-CM | POA: Diagnosis not present

## 2016-08-16 ENCOUNTER — Telehealth: Payer: Self-pay | Admitting: *Deleted

## 2016-08-16 MED ORDER — HYDRALAZINE HCL 50 MG PO TABS: ORAL_TABLET | ORAL | 0 refills | Status: DC

## 2016-08-16 NOTE — Telephone Encounter (Signed)
REFILL 

## 2016-08-26 ENCOUNTER — Other Ambulatory Visit: Payer: Self-pay

## 2016-09-29 ENCOUNTER — Encounter (HOSPITAL_BASED_OUTPATIENT_CLINIC_OR_DEPARTMENT_OTHER): Payer: Medicare Other | Attending: Internal Medicine

## 2016-09-29 DIAGNOSIS — I872 Venous insufficiency (chronic) (peripheral): Secondary | ICD-10-CM | POA: Insufficient documentation

## 2016-09-29 DIAGNOSIS — Z794 Long term (current) use of insulin: Secondary | ICD-10-CM | POA: Diagnosis not present

## 2016-09-29 DIAGNOSIS — Z872 Personal history of diseases of the skin and subcutaneous tissue: Secondary | ICD-10-CM | POA: Diagnosis not present

## 2016-09-29 DIAGNOSIS — E119 Type 2 diabetes mellitus without complications: Secondary | ICD-10-CM | POA: Diagnosis not present

## 2016-09-29 DIAGNOSIS — S80821D Blister (nonthermal), right lower leg, subsequent encounter: Secondary | ICD-10-CM | POA: Diagnosis not present

## 2016-09-29 DIAGNOSIS — Z87891 Personal history of nicotine dependence: Secondary | ICD-10-CM | POA: Insufficient documentation

## 2016-09-29 DIAGNOSIS — E1122 Type 2 diabetes mellitus with diabetic chronic kidney disease: Secondary | ICD-10-CM | POA: Diagnosis not present

## 2016-09-29 DIAGNOSIS — N184 Chronic kidney disease, stage 4 (severe): Secondary | ICD-10-CM | POA: Diagnosis not present

## 2016-11-10 ENCOUNTER — Other Ambulatory Visit: Payer: Self-pay | Admitting: Cardiovascular Disease

## 2016-11-17 ENCOUNTER — Other Ambulatory Visit: Payer: Self-pay | Admitting: Cardiovascular Disease

## 2016-11-17 NOTE — Telephone Encounter (Signed)
Medication Detail    Disp Refills Start End   hydrALAZINE (APRESOLINE) 50 MG tablet 270 tablet 0 11/10/2016    Sig - Route: Take 1 tablet (50 mg total) by mouth 3 (three) times daily. PT DUE YEARLY APPT IN July, PLEASE MAKE APPT. - Oral   Notes to Pharmacy: PT NEEDS APPT IN July, CALL [phone] TO [place], [place]   E-Prescribing Status: Receipt confirmed by pharmacy (11/10/2016 3:22 PM EDT)   Pharmacy   [id number] - , [place]

## 2016-12-09 DIAGNOSIS — E119 Type 2 diabetes mellitus without complications: Secondary | ICD-10-CM | POA: Diagnosis not present

## 2016-12-09 DIAGNOSIS — I10 Essential (primary) hypertension: Secondary | ICD-10-CM | POA: Diagnosis not present

## 2016-12-09 DIAGNOSIS — E785 Hyperlipidemia, unspecified: Secondary | ICD-10-CM | POA: Diagnosis not present

## 2016-12-09 DIAGNOSIS — Z720 Tobacco use: Secondary | ICD-10-CM | POA: Diagnosis not present

## 2016-12-09 DIAGNOSIS — N289 Disorder of kidney and ureter, unspecified: Secondary | ICD-10-CM | POA: Diagnosis not present

## 2016-12-09 DIAGNOSIS — Z01818 Encounter for other preprocedural examination: Secondary | ICD-10-CM | POA: Diagnosis not present

## 2016-12-12 DIAGNOSIS — N184 Chronic kidney disease, stage 4 (severe): Secondary | ICD-10-CM | POA: Diagnosis not present

## 2016-12-12 DIAGNOSIS — I272 Pulmonary hypertension, unspecified: Secondary | ICD-10-CM | POA: Diagnosis not present

## 2016-12-12 DIAGNOSIS — I509 Heart failure, unspecified: Secondary | ICD-10-CM | POA: Diagnosis not present

## 2016-12-12 DIAGNOSIS — I10 Essential (primary) hypertension: Secondary | ICD-10-CM | POA: Diagnosis not present

## 2017-01-25 DIAGNOSIS — I10 Essential (primary) hypertension: Secondary | ICD-10-CM | POA: Diagnosis not present

## 2017-01-25 DIAGNOSIS — I509 Heart failure, unspecified: Secondary | ICD-10-CM | POA: Diagnosis not present

## 2017-01-25 DIAGNOSIS — N185 Chronic kidney disease, stage 5: Secondary | ICD-10-CM | POA: Diagnosis not present

## 2017-01-25 DIAGNOSIS — N2581 Secondary hyperparathyroidism of renal origin: Secondary | ICD-10-CM | POA: Diagnosis not present

## 2017-01-25 DIAGNOSIS — I272 Pulmonary hypertension, unspecified: Secondary | ICD-10-CM | POA: Diagnosis not present

## 2017-01-26 ENCOUNTER — Other Ambulatory Visit: Payer: Self-pay | Admitting: Cardiovascular Disease

## 2017-01-31 ENCOUNTER — Other Ambulatory Visit: Payer: Self-pay | Admitting: Cardiovascular Disease

## 2017-02-01 NOTE — Telephone Encounter (Signed)
Medication Detail    Disp Refills Start End   hydrALAZINE (APRESOLINE) 50 MG tablet 90 tablet 0 01/27/2017    Sig: TAKE 1 TABLET BY MOUTH 3 TIMES A DAY   Notes to Pharmacy: Needs appt for further refills   E-Prescribing Status: Receipt confirmed by pharmacy (01/27/2017 10:09 AM EDT)   Pharmacy   [id number] - [place], [place]

## 2017-02-06 ENCOUNTER — Encounter (HOSPITAL_COMMUNITY): Payer: Self-pay

## 2017-02-06 ENCOUNTER — Emergency Department (HOSPITAL_COMMUNITY): Payer: Medicare Other

## 2017-02-06 ENCOUNTER — Inpatient Hospital Stay (HOSPITAL_COMMUNITY)
Admission: EM | Admit: 2017-02-06 | Discharge: 2017-02-20 | DRG: 264 | Disposition: A | Discharge status: Skilled Nursing Facility | Payer: Medicare Other | Attending: Internal Medicine | Admitting: Internal Medicine

## 2017-02-06 DIAGNOSIS — R7989 Other specified abnormal findings of blood chemistry: Secondary | ICD-10-CM

## 2017-02-06 DIAGNOSIS — Z66 Do not resuscitate: Secondary | ICD-10-CM | POA: Diagnosis not present

## 2017-02-06 DIAGNOSIS — E785 Hyperlipidemia, unspecified: Secondary | ICD-10-CM | POA: Diagnosis not present

## 2017-02-06 DIAGNOSIS — R748 Abnormal levels of other serum enzymes: Secondary | ICD-10-CM | POA: Diagnosis not present

## 2017-02-06 DIAGNOSIS — I739 Peripheral vascular disease, unspecified: Secondary | ICD-10-CM | POA: Diagnosis not present

## 2017-02-06 DIAGNOSIS — M25562 Pain in left knee: Secondary | ICD-10-CM | POA: Diagnosis not present

## 2017-02-06 DIAGNOSIS — E119 Type 2 diabetes mellitus without complications: Secondary | ICD-10-CM | POA: Diagnosis not present

## 2017-02-06 DIAGNOSIS — N186 End stage renal disease: Secondary | ICD-10-CM | POA: Diagnosis not present

## 2017-02-06 DIAGNOSIS — I428 Other cardiomyopathies: Secondary | ICD-10-CM | POA: Diagnosis present

## 2017-02-06 DIAGNOSIS — Z6831 Body mass index (BMI) 31.0-31.9, adult: Secondary | ICD-10-CM

## 2017-02-06 DIAGNOSIS — E871 Hypo-osmolality and hyponatremia: Secondary | ICD-10-CM | POA: Diagnosis not present

## 2017-02-06 DIAGNOSIS — Z86011 Personal history of benign neoplasm of the brain: Secondary | ICD-10-CM

## 2017-02-06 DIAGNOSIS — I10 Essential (primary) hypertension: Secondary | ICD-10-CM | POA: Diagnosis present

## 2017-02-06 DIAGNOSIS — I130 Hypertensive heart and chronic kidney disease with heart failure and stage 1 through stage 4 chronic kidney disease, or unspecified chronic kidney disease: Secondary | ICD-10-CM | POA: Diagnosis not present

## 2017-02-06 DIAGNOSIS — IMO0001 Reserved for inherently not codable concepts without codable children: Secondary | ICD-10-CM

## 2017-02-06 DIAGNOSIS — E46 Unspecified protein-calorie malnutrition: Secondary | ICD-10-CM | POA: Diagnosis not present

## 2017-02-06 DIAGNOSIS — I2510 Atherosclerotic heart disease of native coronary artery without angina pectoris: Secondary | ICD-10-CM | POA: Diagnosis present

## 2017-02-06 DIAGNOSIS — N179 Acute kidney failure, unspecified: Secondary | ICD-10-CM

## 2017-02-06 DIAGNOSIS — E213 Hyperparathyroidism, unspecified: Secondary | ICD-10-CM | POA: Diagnosis present

## 2017-02-06 DIAGNOSIS — Z87891 Personal history of nicotine dependence: Secondary | ICD-10-CM | POA: Diagnosis not present

## 2017-02-06 DIAGNOSIS — E1122 Type 2 diabetes mellitus with diabetic chronic kidney disease: Secondary | ICD-10-CM | POA: Diagnosis not present

## 2017-02-06 DIAGNOSIS — E875 Hyperkalemia: Secondary | ICD-10-CM | POA: Diagnosis not present

## 2017-02-06 DIAGNOSIS — E669 Obesity, unspecified: Secondary | ICD-10-CM | POA: Diagnosis present

## 2017-02-06 DIAGNOSIS — Z452 Encounter for adjustment and management of vascular access device: Secondary | ICD-10-CM

## 2017-02-06 DIAGNOSIS — Z794 Long term (current) use of insulin: Secondary | ICD-10-CM

## 2017-02-06 DIAGNOSIS — I509 Heart failure, unspecified: Secondary | ICD-10-CM

## 2017-02-06 DIAGNOSIS — I5043 Acute on chronic combined systolic (congestive) and diastolic (congestive) heart failure: Secondary | ICD-10-CM

## 2017-02-06 DIAGNOSIS — N183 Chronic kidney disease, stage 3 (moderate): Secondary | ICD-10-CM | POA: Diagnosis not present

## 2017-02-06 DIAGNOSIS — I953 Hypotension of hemodialysis: Secondary | ICD-10-CM | POA: Diagnosis not present

## 2017-02-06 DIAGNOSIS — N189 Chronic kidney disease, unspecified: Secondary | ICD-10-CM | POA: Diagnosis not present

## 2017-02-06 DIAGNOSIS — R0600 Dyspnea, unspecified: Secondary | ICD-10-CM | POA: Diagnosis present

## 2017-02-06 DIAGNOSIS — M25561 Pain in right knee: Secondary | ICD-10-CM | POA: Diagnosis not present

## 2017-02-06 DIAGNOSIS — D631 Anemia in chronic kidney disease: Secondary | ICD-10-CM | POA: Diagnosis not present

## 2017-02-06 DIAGNOSIS — M25569 Pain in unspecified knee: Secondary | ICD-10-CM | POA: Diagnosis not present

## 2017-02-06 DIAGNOSIS — R609 Edema, unspecified: Secondary | ICD-10-CM | POA: Diagnosis not present

## 2017-02-06 DIAGNOSIS — N390 Urinary tract infection, site not specified: Secondary | ICD-10-CM

## 2017-02-06 DIAGNOSIS — Z992 Dependence on renal dialysis: Secondary | ICD-10-CM

## 2017-02-06 DIAGNOSIS — G8929 Other chronic pain: Secondary | ICD-10-CM | POA: Diagnosis not present

## 2017-02-06 DIAGNOSIS — I248 Other forms of acute ischemic heart disease: Secondary | ICD-10-CM | POA: Diagnosis not present

## 2017-02-06 DIAGNOSIS — Z9114 Patient's other noncompliance with medication regimen: Secondary | ICD-10-CM

## 2017-02-06 DIAGNOSIS — I160 Hypertensive urgency: Secondary | ICD-10-CM

## 2017-02-06 DIAGNOSIS — Z7982 Long term (current) use of aspirin: Secondary | ICD-10-CM

## 2017-02-06 DIAGNOSIS — I132 Hypertensive heart and chronic kidney disease with heart failure and with stage 5 chronic kidney disease, or end stage renal disease: Secondary | ICD-10-CM | POA: Diagnosis not present

## 2017-02-06 DIAGNOSIS — E1159 Type 2 diabetes mellitus with other circulatory complications: Secondary | ICD-10-CM | POA: Diagnosis present

## 2017-02-06 DIAGNOSIS — Z419 Encounter for procedure for purposes other than remedying health state, unspecified: Secondary | ICD-10-CM

## 2017-02-06 DIAGNOSIS — R0602 Shortness of breath: Secondary | ICD-10-CM | POA: Diagnosis not present

## 2017-02-06 DIAGNOSIS — N184 Chronic kidney disease, stage 4 (severe): Secondary | ICD-10-CM | POA: Diagnosis not present

## 2017-02-06 DIAGNOSIS — N2581 Secondary hyperparathyroidism of renal origin: Secondary | ICD-10-CM | POA: Diagnosis not present

## 2017-02-06 DIAGNOSIS — I360 Nonrheumatic tricuspid (valve) stenosis: Secondary | ICD-10-CM | POA: Diagnosis not present

## 2017-02-06 DIAGNOSIS — Z90710 Acquired absence of both cervix and uterus: Secondary | ICD-10-CM

## 2017-02-06 DIAGNOSIS — B9620 Unspecified Escherichia coli [E. coli] as the cause of diseases classified elsewhere: Secondary | ICD-10-CM | POA: Diagnosis present

## 2017-02-06 MED ORDER — FUROSEMIDE 10 MG/ML IJ SOLN
80.0000 mg | Freq: Once | INTRAMUSCULAR | Status: AC
  Administered 2017-02-06: 80 mg via INTRAVENOUS
  Filled 2017-02-06: qty 8

## 2017-02-06 NOTE — ED Triage Notes (Signed)
Patient from home with CHF with persistent shortness of breath and bilateral leg swelling.  Patient Also becoming weaker the last 2 days.  Vitals stable, is hypertensive and takes meds for such.  No other complains, A&Ox4.

## 2017-02-07 ENCOUNTER — Inpatient Hospital Stay (HOSPITAL_COMMUNITY): Payer: Medicare Other

## 2017-02-07 ENCOUNTER — Encounter (HOSPITAL_COMMUNITY): Payer: Self-pay | Admitting: Internal Medicine

## 2017-02-07 DIAGNOSIS — I5043 Acute on chronic combined systolic (congestive) and diastolic (congestive) heart failure: Secondary | ICD-10-CM | POA: Diagnosis not present

## 2017-02-07 DIAGNOSIS — I160 Hypertensive urgency: Secondary | ICD-10-CM | POA: Diagnosis not present

## 2017-02-07 DIAGNOSIS — E875 Hyperkalemia: Secondary | ICD-10-CM | POA: Diagnosis not present

## 2017-02-07 DIAGNOSIS — Z992 Dependence on renal dialysis: Secondary | ICD-10-CM | POA: Diagnosis not present

## 2017-02-07 DIAGNOSIS — E46 Unspecified protein-calorie malnutrition: Secondary | ICD-10-CM | POA: Diagnosis not present

## 2017-02-07 DIAGNOSIS — H409 Unspecified glaucoma: Secondary | ICD-10-CM | POA: Diagnosis not present

## 2017-02-07 DIAGNOSIS — M25562 Pain in left knee: Secondary | ICD-10-CM | POA: Diagnosis not present

## 2017-02-07 DIAGNOSIS — I5042 Chronic combined systolic (congestive) and diastolic (congestive) heart failure: Secondary | ICD-10-CM | POA: Diagnosis not present

## 2017-02-07 DIAGNOSIS — Z01810 Encounter for preprocedural cardiovascular examination: Secondary | ICD-10-CM | POA: Diagnosis not present

## 2017-02-07 DIAGNOSIS — I509 Heart failure, unspecified: Secondary | ICD-10-CM | POA: Diagnosis not present

## 2017-02-07 DIAGNOSIS — I120 Hypertensive chronic kidney disease with stage 5 chronic kidney disease or end stage renal disease: Secondary | ICD-10-CM | POA: Diagnosis not present

## 2017-02-07 DIAGNOSIS — Z794 Long term (current) use of insulin: Secondary | ICD-10-CM | POA: Diagnosis not present

## 2017-02-07 DIAGNOSIS — D649 Anemia, unspecified: Secondary | ICD-10-CM | POA: Diagnosis not present

## 2017-02-07 DIAGNOSIS — N179 Acute kidney failure, unspecified: Secondary | ICD-10-CM | POA: Diagnosis not present

## 2017-02-07 DIAGNOSIS — I2510 Atherosclerotic heart disease of native coronary artery without angina pectoris: Secondary | ICD-10-CM | POA: Diagnosis not present

## 2017-02-07 DIAGNOSIS — Z452 Encounter for adjustment and management of vascular access device: Secondary | ICD-10-CM | POA: Diagnosis not present

## 2017-02-07 DIAGNOSIS — Z86011 Personal history of benign neoplasm of the brain: Secondary | ICD-10-CM | POA: Diagnosis not present

## 2017-02-07 DIAGNOSIS — N186 End stage renal disease: Secondary | ICD-10-CM | POA: Diagnosis not present

## 2017-02-07 DIAGNOSIS — I360 Nonrheumatic tricuspid (valve) stenosis: Secondary | ICD-10-CM | POA: Diagnosis not present

## 2017-02-07 DIAGNOSIS — I10 Essential (primary) hypertension: Secondary | ICD-10-CM | POA: Diagnosis not present

## 2017-02-07 DIAGNOSIS — Z66 Do not resuscitate: Secondary | ICD-10-CM | POA: Diagnosis present

## 2017-02-07 DIAGNOSIS — I5040 Unspecified combined systolic (congestive) and diastolic (congestive) heart failure: Secondary | ICD-10-CM | POA: Diagnosis not present

## 2017-02-07 DIAGNOSIS — M170 Bilateral primary osteoarthritis of knee: Secondary | ICD-10-CM | POA: Diagnosis not present

## 2017-02-07 DIAGNOSIS — N184 Chronic kidney disease, stage 4 (severe): Secondary | ICD-10-CM | POA: Diagnosis not present

## 2017-02-07 DIAGNOSIS — I248 Other forms of acute ischemic heart disease: Secondary | ICD-10-CM | POA: Diagnosis present

## 2017-02-07 DIAGNOSIS — I953 Hypotension of hemodialysis: Secondary | ICD-10-CM | POA: Diagnosis not present

## 2017-02-07 DIAGNOSIS — M6281 Muscle weakness (generalized): Secondary | ICD-10-CM | POA: Diagnosis not present

## 2017-02-07 DIAGNOSIS — Z7982 Long term (current) use of aspirin: Secondary | ICD-10-CM | POA: Diagnosis not present

## 2017-02-07 DIAGNOSIS — Z87891 Personal history of nicotine dependence: Secondary | ICD-10-CM | POA: Diagnosis not present

## 2017-02-07 DIAGNOSIS — M25561 Pain in right knee: Secondary | ICD-10-CM | POA: Diagnosis not present

## 2017-02-07 DIAGNOSIS — R2243 Localized swelling, mass and lump, lower limb, bilateral: Secondary | ICD-10-CM | POA: Diagnosis not present

## 2017-02-07 DIAGNOSIS — E871 Hypo-osmolality and hyponatremia: Secondary | ICD-10-CM | POA: Diagnosis not present

## 2017-02-07 DIAGNOSIS — M25569 Pain in unspecified knee: Secondary | ICD-10-CM | POA: Diagnosis not present

## 2017-02-07 DIAGNOSIS — R748 Abnormal levels of other serum enzymes: Secondary | ICD-10-CM | POA: Diagnosis not present

## 2017-02-07 DIAGNOSIS — R5383 Other fatigue: Secondary | ICD-10-CM | POA: Diagnosis not present

## 2017-02-07 DIAGNOSIS — R0602 Shortness of breath: Secondary | ICD-10-CM | POA: Diagnosis not present

## 2017-02-07 DIAGNOSIS — Z9114 Patient's other noncompliance with medication regimen: Secondary | ICD-10-CM | POA: Diagnosis not present

## 2017-02-07 DIAGNOSIS — G8929 Other chronic pain: Secondary | ICD-10-CM | POA: Diagnosis not present

## 2017-02-07 DIAGNOSIS — M1711 Unilateral primary osteoarthritis, right knee: Secondary | ICD-10-CM | POA: Diagnosis not present

## 2017-02-07 DIAGNOSIS — I132 Hypertensive heart and chronic kidney disease with heart failure and with stage 5 chronic kidney disease, or end stage renal disease: Secondary | ICD-10-CM | POA: Diagnosis not present

## 2017-02-07 DIAGNOSIS — J9622 Acute and chronic respiratory failure with hypercapnia: Secondary | ICD-10-CM | POA: Diagnosis not present

## 2017-02-07 DIAGNOSIS — N390 Urinary tract infection, site not specified: Secondary | ICD-10-CM | POA: Diagnosis not present

## 2017-02-07 DIAGNOSIS — E669 Obesity, unspecified: Secondary | ICD-10-CM | POA: Diagnosis present

## 2017-02-07 DIAGNOSIS — N189 Chronic kidney disease, unspecified: Secondary | ICD-10-CM | POA: Diagnosis not present

## 2017-02-07 DIAGNOSIS — E119 Type 2 diabetes mellitus without complications: Secondary | ICD-10-CM | POA: Diagnosis not present

## 2017-02-07 DIAGNOSIS — E43 Unspecified severe protein-calorie malnutrition: Secondary | ICD-10-CM | POA: Diagnosis not present

## 2017-02-07 DIAGNOSIS — M1712 Unilateral primary osteoarthritis, left knee: Secondary | ICD-10-CM | POA: Diagnosis not present

## 2017-02-07 DIAGNOSIS — I428 Other cardiomyopathies: Secondary | ICD-10-CM | POA: Diagnosis present

## 2017-02-07 DIAGNOSIS — E0865 Diabetes mellitus due to underlying condition with hyperglycemia: Secondary | ICD-10-CM | POA: Diagnosis not present

## 2017-02-07 DIAGNOSIS — N2581 Secondary hyperparathyroidism of renal origin: Secondary | ICD-10-CM | POA: Diagnosis present

## 2017-02-07 DIAGNOSIS — I129 Hypertensive chronic kidney disease with stage 1 through stage 4 chronic kidney disease, or unspecified chronic kidney disease: Secondary | ICD-10-CM | POA: Diagnosis not present

## 2017-02-07 DIAGNOSIS — D631 Anemia in chronic kidney disease: Secondary | ICD-10-CM | POA: Diagnosis present

## 2017-02-07 DIAGNOSIS — R262 Difficulty in walking, not elsewhere classified: Secondary | ICD-10-CM | POA: Diagnosis not present

## 2017-02-07 DIAGNOSIS — R0600 Dyspnea, unspecified: Secondary | ICD-10-CM | POA: Diagnosis not present

## 2017-02-07 DIAGNOSIS — E1122 Type 2 diabetes mellitus with diabetic chronic kidney disease: Secondary | ICD-10-CM | POA: Diagnosis not present

## 2017-02-07 DIAGNOSIS — I5023 Acute on chronic systolic (congestive) heart failure: Secondary | ICD-10-CM | POA: Diagnosis not present

## 2017-02-07 DIAGNOSIS — I739 Peripheral vascular disease, unspecified: Secondary | ICD-10-CM | POA: Diagnosis present

## 2017-02-07 DIAGNOSIS — E785 Hyperlipidemia, unspecified: Secondary | ICD-10-CM | POA: Diagnosis not present

## 2017-02-07 LAB — GLUCOSE, CAPILLARY
GLUCOSE-CAPILLARY: 165 mg/dL — AB (ref 65–99)
Glucose-Capillary: 256 mg/dL — ABNORMAL HIGH (ref 65–99)
Glucose-Capillary: 179 mg/dL — ABNORMAL HIGH (ref 65–99)
Glucose-Capillary: 243 mg/dL — ABNORMAL HIGH (ref 65–99)

## 2017-02-07 LAB — BASIC METABOLIC PANEL
Anion gap: 10 (ref 5–15)
BUN: 63 mg/dL — AB (ref 6–20)
CO2: 23 mmol/L (ref 22–32)
Calcium: 8.5 mg/dL — ABNORMAL LOW (ref 8.9–10.3)
Chloride: 106 mmol/L (ref 101–111)
Creatinine, Ser: 4.4 mg/dL — ABNORMAL HIGH (ref 0.44–1.00)
GFR, EST AFRICAN AMERICAN: 11 mL/min — AB (ref >60)
GFR, EST NON AFRICAN AMERICAN: 9 mL/min — AB (ref >60)
Glucose, Bld: 106 mg/dL — ABNORMAL HIGH (ref 65–99)
POTASSIUM: 4.6 mmol/L (ref 3.5–5.1)
SODIUM: 139 mmol/L (ref 135–145)

## 2017-02-07 LAB — I-STAT TROPONIN, ED: Troponin i, poc: 0.09 ng/mL (ref 0.00–0.08)

## 2017-02-07 LAB — TROPONIN I
TROPONIN I: 0.06 ng/mL — AB (ref <0.03)
Troponin I: 0.06 ng/mL (ref <0.03)
Troponin I: 0.06 ng/mL (ref <0.03)

## 2017-02-07 LAB — CBC
HEMATOCRIT: 35.2 % — AB (ref 36.0–46.0)
Hemoglobin: 10.6 g/dL — ABNORMAL LOW (ref 12.0–15.0)
MCH: 26.7 pg (ref 26.0–34.0)
MCHC: 30.1 g/dL (ref 30.0–36.0)
MCV: 88.7 fL (ref 78.0–100.0)
PLATELETS: 295 10*3/uL (ref 150–400)
RBC: 3.97 MIL/uL (ref 3.87–5.11)
RDW: 15.3 % (ref 11.5–15.5)
WBC: 5.6 10*3/uL (ref 4.0–10.5)

## 2017-02-07 LAB — ECHOCARDIOGRAM COMPLETE
Height: 63 in
Weight: 2889.6 oz

## 2017-02-07 LAB — BRAIN NATRIURETIC PEPTIDE: B Natriuretic Peptide: 463.4 pg/mL — ABNORMAL HIGH (ref 0.0–100.0)

## 2017-02-07 MED ORDER — HYDRALAZINE HCL 50 MG PO TABS
50.0000 mg | ORAL_TABLET | Freq: Three times a day (TID) | ORAL | Status: DC
  Administered 2017-02-07 – 2017-02-14 (×20): 50 mg via ORAL
  Filled 2017-02-07 (×21): qty 1

## 2017-02-07 MED ORDER — CALCIUM ACETATE (PHOS BINDER) 667 MG PO CAPS
667.0000 mg | ORAL_CAPSULE | Freq: Three times a day (TID) | ORAL | Status: DC
  Administered 2017-02-07 – 2017-02-20 (×33): 667 mg via ORAL
  Filled 2017-02-07 (×34): qty 1

## 2017-02-07 MED ORDER — HYDRALAZINE HCL 20 MG/ML IJ SOLN
10.0000 mg | Freq: Once | INTRAMUSCULAR | Status: AC
  Administered 2017-02-07: 10 mg via INTRAVENOUS
  Filled 2017-02-07: qty 1

## 2017-02-07 MED ORDER — INSULIN DETEMIR 100 UNIT/ML ~~LOC~~ SOLN
20.0000 [IU] | Freq: Every day | SUBCUTANEOUS | Status: DC
  Administered 2017-02-07 – 2017-02-11 (×4): 20 [IU] via SUBCUTANEOUS
  Filled 2017-02-07 (×5): qty 0.2

## 2017-02-07 MED ORDER — CARVEDILOL 12.5 MG PO TABS
12.5000 mg | ORAL_TABLET | Freq: Once | ORAL | Status: AC
  Administered 2017-02-07: 12.5 mg via ORAL
  Filled 2017-02-07: qty 1

## 2017-02-07 MED ORDER — CALCITRIOL 0.5 MCG PO CAPS
0.5000 ug | ORAL_CAPSULE | Freq: Every day | ORAL | Status: DC
  Administered 2017-02-07 – 2017-02-17 (×11): 0.5 ug via ORAL
  Filled 2017-02-07 (×9): qty 1

## 2017-02-07 MED ORDER — HYDRALAZINE HCL 50 MG PO TABS
50.0000 mg | ORAL_TABLET | Freq: Once | ORAL | Status: AC
  Administered 2017-02-07: 50 mg via ORAL
  Filled 2017-02-07: qty 1

## 2017-02-07 MED ORDER — BRIMONIDINE TARTRATE-TIMOLOL 0.2-0.5 % OP SOLN
1.0000 [drp] | Freq: Two times a day (BID) | OPHTHALMIC | Status: DC | PRN
  Filled 2017-02-07: qty 5

## 2017-02-07 MED ORDER — HEPARIN SODIUM (PORCINE) 5000 UNIT/ML IJ SOLN
5000.0000 [IU] | Freq: Three times a day (TID) | INTRAMUSCULAR | Status: DC
  Administered 2017-02-07 – 2017-02-20 (×39): 5000 [IU] via SUBCUTANEOUS
  Filled 2017-02-07 (×39): qty 1

## 2017-02-07 MED ORDER — CARVEDILOL 12.5 MG PO TABS
12.5000 mg | ORAL_TABLET | Freq: Two times a day (BID) | ORAL | Status: DC
  Administered 2017-02-07 – 2017-02-20 (×25): 12.5 mg via ORAL
  Filled 2017-02-07 (×25): qty 1

## 2017-02-07 MED ORDER — HYDRALAZINE HCL 20 MG/ML IJ SOLN
10.0000 mg | INTRAMUSCULAR | Status: DC | PRN
  Administered 2017-02-07: 10 mg via INTRAVENOUS
  Filled 2017-02-07: qty 1

## 2017-02-07 MED ORDER — FUROSEMIDE 10 MG/ML IJ SOLN: 60.0000 mg | Freq: Two times a day (BID) | INTRAMUSCULAR | Status: DC

## 2017-02-07 MED ORDER — INSULIN ASPART 100 UNIT/ML ~~LOC~~ SOLN
0.0000 [IU] | Freq: Three times a day (TID) | SUBCUTANEOUS | Status: DC
  Administered 2017-02-07 (×2): 2 [IU] via SUBCUTANEOUS
  Administered 2017-02-07: 5 [IU] via SUBCUTANEOUS
  Administered 2017-02-08 – 2017-02-09 (×3): 2 [IU] via SUBCUTANEOUS
  Administered 2017-02-12: 1 [IU] via SUBCUTANEOUS
  Administered 2017-02-12 – 2017-02-13 (×2): 2 [IU] via SUBCUTANEOUS
  Administered 2017-02-14 (×2): 1 [IU] via SUBCUTANEOUS
  Administered 2017-02-15: 3 [IU] via SUBCUTANEOUS
  Administered 2017-02-16: 5 [IU] via SUBCUTANEOUS
  Administered 2017-02-16 – 2017-02-17 (×3): 3 [IU] via SUBCUTANEOUS
  Administered 2017-02-18: 2 [IU] via SUBCUTANEOUS
  Administered 2017-02-18: 1 [IU] via SUBCUTANEOUS
  Administered 2017-02-18 – 2017-02-19 (×3): 3 [IU] via SUBCUTANEOUS
  Administered 2017-02-19: 2 [IU] via SUBCUTANEOUS

## 2017-02-07 MED ORDER — ACETAMINOPHEN 325 MG PO TABS
650.0000 mg | ORAL_TABLET | Freq: Four times a day (QID) | ORAL | Status: DC | PRN
  Administered 2017-02-10 – 2017-02-17 (×2): 650 mg via ORAL
  Filled 2017-02-07: qty 2

## 2017-02-07 MED ORDER — TIMOLOL MALEATE 0.5 % OP SOLN
1.0000 [drp] | Freq: Two times a day (BID) | OPHTHALMIC | Status: DC
  Administered 2017-02-07 – 2017-02-19 (×26): 1 [drp] via OPHTHALMIC
  Filled 2017-02-07: qty 5

## 2017-02-07 MED ORDER — MECLIZINE HCL 25 MG PO TABS
12.5000 mg | ORAL_TABLET | Freq: Three times a day (TID) | ORAL | Status: DC | PRN
  Administered 2017-02-11: 12.5 mg via ORAL
  Filled 2017-02-07: qty 1

## 2017-02-07 MED ORDER — FUROSEMIDE 10 MG/ML IJ SOLN
80.0000 mg | Freq: Two times a day (BID) | INTRAMUSCULAR | Status: DC
  Administered 2017-02-07 – 2017-02-09 (×5): 80 mg via INTRAVENOUS
  Filled 2017-02-07 (×5): qty 8

## 2017-02-07 MED ORDER — ASPIRIN EC 81 MG PO TBEC
81.0000 mg | DELAYED_RELEASE_TABLET | Freq: Every day | ORAL | Status: DC
  Administered 2017-02-07 – 2017-02-20 (×14): 81 mg via ORAL
  Filled 2017-02-07 (×14): qty 1

## 2017-02-07 MED ORDER — BRIMONIDINE TARTRATE 0.2 % OP SOLN
1.0000 [drp] | Freq: Two times a day (BID) | OPHTHALMIC | Status: DC
  Administered 2017-02-07 – 2017-02-19 (×26): 1 [drp] via OPHTHALMIC
  Filled 2017-02-07: qty 5

## 2017-02-07 MED ORDER — HYDRALAZINE HCL 20 MG/ML IJ SOLN: 10.0000 mg | Freq: Once | INTRAMUSCULAR | Status: DC

## 2017-02-07 MED ORDER — ISOSORBIDE MONONITRATE ER 30 MG PO TB24
30.0000 mg | ORAL_TABLET | Freq: Every day | ORAL | Status: DC
  Administered 2017-02-07 – 2017-02-20 (×14): 30 mg via ORAL
  Filled 2017-02-07 (×14): qty 1

## 2017-02-07 MED ORDER — ACETAMINOPHEN 650 MG RE SUPP: 650.0000 mg | Freq: Four times a day (QID) | RECTAL | Status: DC | PRN

## 2017-02-07 MED ORDER — CALCITRIOL 0.5 MCG PO CAPS: 0.5000 ug | ORAL_CAPSULE | Freq: Three times a day (TID) | ORAL | Status: DC

## 2017-02-07 NOTE — Progress Notes (Signed)
  Echocardiogram 2D Echocardiogram has been performed.  Kathleen [name] 02/07/2017, 12:53 PM

## 2017-02-07 NOTE — H&P (Signed)
History and Physical    Kathleen [name]:[id number] DOB: 1945/09/04 DOA: 02/06/2017  PCP: Benito Mccreedy, MD  Patient coming from: Home.  Chief Complaint: Shortness of breath.  HPI: Kathleen [name] is a 71 y.o. female with history of chronic kidney disease stage IV to 5, chronic combined systolic and diastolic CHF posterior fourchette was 40-45% with grade 1 diastolic dysfunction in December 2016 1 diabetes mellitus, anemia presents to the ER with increasing shortness of breath and lower extremity edema. Patient states 2 weeks ago patient's nephrologist increased her Lasix dose from once a day to 3 times a day. Patient was unable to take the dose because she was unable to ambulate for frequent urination. She was only taking twice a day. Patient's shortness of breath is mostly on lying down. Denies any chest pain or productive cough.  ED Course: Chest x-ray in the ER shows mild congestion. Blood pressure was elevated. Patient has mild lower extremity edema. Patient was given Lasix 80 mg IV in the ER and admitted for further management of CHF.  Review of Systems: As per HPI, rest all negative.   Past Medical History:  Diagnosis Date  . Anemia    a. 01/2014: suspected of chronic disease, negative FOBT.  [name] CAD (coronary artery disease)    a. Cath 01/2014: mild in LAD, mild-mod LCx, mod-severe RCA - for med rx.  . CKD (chronic kidney disease), stage III    a. Borderline III-IV.  . Diabetes mellitus without complication ([place])   . History of echocardiogram    Echo (10/15):  Mod LVH, EF 50-55%, Gr 1 DD, MAC, mild MR, mild TR, PASP 35 mmHg  . Hyperlipidemia   . Hypertension   . Meningioma ([place])    a. Incidental dx 01/2014.  . Obesity   . Systolic CHF ([place])    a. 10/9765: dx with mixed ICM/NICM (out of proportion to CAD) EF 25-30% by echo.     Past Surgical History:  Procedure Laterality Date  . ABDOMINAL HYSTERECTOMY    . BREAST SURGERY    . LEFT HEART CATHETERIZATION WITH  CORONARY ANGIOGRAM N/A 01/27/2014   Procedure: LEFT HEART CATHETERIZATION WITH CORONARY ANGIOGRAM;  Surgeon: Jettie Booze, MD;  Location: [facility] C[facility] LAB;  Service: Cardiovascular;  Laterality: N/A;  . TONSILLECTOMY       reports that she has quit smoking. Her smoking use included Cigarettes. She has a 7.50 pack-year smoking history. She has never used smokeless tobacco. She reports that she does not drink alcohol or use drugs.  No Known Allergies  Family History  Problem Relation Age of Onset  . Diabetic kidney disease Mother   . Hypertension Mother   . Heart failure Mother   . Heart attack Mother   . Hyperlipidemia Mother   . Hypertension Father     Prior to Admission medications   Medication Sig Start Date End Date Taking? Authorizing Provider  aspirin 81 MG EC tablet Take 1 tablet (81 mg total) by mouth daily. 12/25/15  Yes Cherene Altes, MD  brimonidine-timolol (COMBIGAN) 0.2-0.5 % ophthalmic solution Place 1 drop into the right eye 2 (two) times daily as needed (for glaucoma).    Yes [provider]  calcitRIOL (ROCALTROL) 0.5 MCG capsule Take 0.5 mcg by mouth 3 (three) times daily. 01/07/17  Yes [provider]  calcium acetate (PHOSLO) 667 MG capsule Take 667 mg by mouth 3 (three) times daily with meals.   Yes [provider]  carvedilol (COREG)  12.5 MG tablet Take 12.5 mg by mouth 2 (two) times daily with a meal.  09/30/14  Yes [provider]  furosemide (LASIX) 40 MG tablet Take 120 mg by mouth daily. 01/10/17  Yes [provider]  hydrALAZINE (APRESOLINE) 50 MG tablet TAKE 1 TABLET BY MOUTH 3 TIMES A DAY 01/27/17  Yes Josue Hector, MD  insulin aspart (NOVOLOG) 100 UNIT/ML injection Inject 6 Units into the skin 3 (three) times daily with meals. CBG < 70: implement hypoglycemia protocol CBG 70 - 120: 0 units CBG 121 - 150: 1 unit CBG 151 - 200: 2 units CBG 201 - 250: 3 units CBG 251 - 300: 5 units CBG 301 - 350: 7 units CBG  351 - 400: 9 units CBG > 400: call MD. 12/22/15  Yes Cherene Altes, MD  insulin detemir (LEVEMIR) 100 UNIT/ML injection Inject 0.1 mLs (10 Units total) into the skin at bedtime. Patient taking differently: Inject 20 Units into the skin at bedtime.  06/12/14  Yes Hongalgi, Lenis Dickinson, MD  isosorbide mononitrate (IMDUR) 30 MG 24 hr tablet Take 1 tablet (30 mg total) by mouth daily. 06/02/16  Yes Lorretta Harp, MD  furosemide (LASIX) 80 MG tablet Take 1 tablet (80 mg total) by mouth daily. Patient not taking: Reported on 02/06/2017 12/22/15   Cherene Altes, MD  meclizine (ANTIVERT) 12.5 MG tablet Take 1 tablet (12.5 mg total) by mouth 3 (three) times daily as needed for dizziness or nausea. For dizzy spells 12/22/15   Cherene Altes, MD  Nutritional Supplements (FEEDING SUPPLEMENT, NEPRO CARB STEADY,) LIQD Take 237 mLs by mouth 2 (two) times daily between meals. Patient not taking: Reported on 02/06/2017 08/14/15   Orson Eva, MD    Physical Exam: Vitals:   02/07/17 0100 02/07/17 0130 02/07/17 0200 02/07/17 0244  BP: (!) 180/76 (!) 149/76 (!) 160/77 (!) 162/88  Pulse: 84 84 81 85  Resp: (!) 27 (!) _0 Temp:    98.2 F (36.8 C)  TempSrc:    Oral  SpO2: 95% 93% 95% 96%  Weight:    81.9 kg (180 lb 9.6 oz)  Height:    _1  (1.6 m)      Constitutional: Moderately built and nourished. Vitals:   02/07/17 0100 02/07/17 0130 02/07/17 0200 02/07/17 0244  BP: (!) 180/76 (!) 149/76 (!) 160/77 (!) 162/88  Pulse: 84 84 81 85  Resp: (!) 27 (!) _2 Temp:    98.2 F (36.8 C)  TempSrc:    Oral  SpO2: 95% 93% 95% 96%  Weight:    81.9 kg (180 lb 9.6 oz)  Height:    _3  (1.6 m)   Eyes: Anicteric. No pallor. ENMT: No discharge from the ears eyes nose and mouth. Neck: JVD elevated mass felt. Respiratory: No rhonchi or crepitations. Cardiovascular: S1 and S2 heard no murmurs appreciated. Abdomen: Soft nontender bowel sounds present. Musculoskeletal: Mild edema bilaterally  lower extremity. Skin: No rash. Skin appears warm. Neurologic: Alert awake oriented to time place and person. Moves all extremities. Psychiatric: Appears normal. Normal affect.   Labs on Admission: I have personally reviewed following labs and imaging studies  CBC:  Recent Labs Lab 02/07/17 0029  WBC 5.6  HGB 10.6*  HCT 35.2*  MCV 88.7  PLT 106   Basic Metabolic Panel:  Recent Labs Lab 02/07/17 0029  NA 139  K 4.6  CL 106  CO2 23  GLUCOSE 106*  BUN  63*  CREATININE 4.40*  CALCIUM 8.5*   GFR: Estimated Creatinine Clearance: 11.7 mL/min (A) (by C-G formula based on SCr of 4.4 mg/dL (H)). Liver Function Tests: No results for input(s): AST, ALT, ALKPHOS, BILITOT, PROT, ALBUMIN in the last 168 hours. No results for input(s): LIPASE, AMYLASE in the last 168 hours. No results for input(s): AMMONIA in the last 168 hours. Coagulation Profile: No results for input(s): INR, PROTIME in the last 168 hours. Cardiac Enzymes: No results for input(s): CKTOTAL, CKMB, CKMBINDEX, TROPONINI in the last 168 hours. BNP (last 3 results) No results for input(s): PROBNP in the last 8760 hours. HbA1C: No results for input(s): HGBA1C in the last 72 hours. CBG: No results for input(s): GLUCAP in the last 168 hours. Lipid Profile: No results for input(s): CHOL, HDL, LDLCALC, TRIG, CHOLHDL, LDLDIRECT in the last 72 hours. Thyroid Function Tests: No results for input(s): TSH, T4TOTAL, FREET4, T3FREE, THYROIDAB in the last 72 hours. Anemia Panel: No results for input(s): VITAMINB12, FOLATE, FERRITIN, TIBC, IRON, RETICCTPCT in the last 72 hours. Urine analysis:    Component Value Date/Time   COLORURINE YELLOW 08/05/2015 1548   APPEARANCEUR HAZY (A) 08/05/2015 1548   LABSPEC 1.020 08/05/2015 1548   PHURINE 5.0 08/05/2015 [place] 08/05/2015 1548   HGBUR NEGATIVE 08/05/2015 1548   BILIRUBINUR NEGATIVE 08/05/2015 1548   KETONESUR NEGATIVE 08/05/2015 1548   PROTEINUR >300  (A) 08/05/2015 1548   UROBILINOGEN 0.2 06/11/2014 0639   NITRITE NEGATIVE 08/05/2015 1548   LEUKOCYTESUR NEGATIVE 08/05/2015 1548   Sepsis Labs: _0 (procalcitonin:4,lacticidven:4) )No results found for this or any previous visit (from the past 240 hour(s)).   Radiological Exams on Admission: Dg Chest 2 View  Result Date: 02/06/2017 CLINICAL DATA:  Shortness of breath and nausea for 1 week EXAM: CHEST  2 VIEW COMPARISON:  August 06, 2015 FINDINGS: The mediastinal contour is normal heart size is enlarged the aorta is tortuous. There is mild increased pulmonary interstitium bilaterally. There is no focal pneumonia. There are small bilateral pleural effusions. The visualized skeletal structures are stable. IMPRESSION: Mild congestive heart failure. Electronically Signed   By: Abelardo Diesel M.D.   On: 02/06/2017 18:32    EKG: Independently reviewed. Normal sinus rhythm with nonspecific T-wave changes.  Assessment/Plan Principal Problem:   Acute on chronic combined systolic and diastolic congestive heart failure (HCC) Active Problems:   Hypertensive urgency   CKD stage 4 due to type 2 diabetes mellitus (HCC)   CHF (congestive heart failure) ([place])    1. Acute chronic combined systolic and diastolic CHF last EF measured in 2016 December was 40-40% with grade 1 diastolic dysfunction - patient had received Lasix 80 mg IV in the ER. I have placed patient on 60 mg IV every 12. Patient not on ACE inhibitor or ARB due to renal failure. Patient on Imdur and hydralazine. Check 2-D echo. Patient may need renal input due to worsening renal function and pulmonary edema. Check daily weights and intake output. 2. Acute chronic kidney disease stage IV to 5 - Lasix IV at this time. See #1. May need renal input. Follow intake and output. 3. Diabetes mellitus type 2 and regular 20 units at bedtime along with sliding scale coverage. 4. Hypertension controlled - nutrition to home medications I have placed  patient on when necessary IV hydralazine. 5. Anemia secondary to chronic kidney disease follow CBC.   DVT prophylaxis: Heparin. Code Status: Full code.  Family Communication: Discussed with patient.  Disposition Plan: Home.  Consults called: None.  Admission status: Inpatient.    Rise Patience MD [facility] Pager [phone].  If 7PM-7AM, please contact night-coverage [email or web address] Password [email or web address]  02/07/2017, 4:00 AM

## 2017-02-07 NOTE — ED Provider Notes (Signed)
12:00 AM  Assumed care from Dr. Venora Maples.  Pt is a 71 y.o. F with history of hypertension, hyperlipidemia, diabetes, CAD, CKD, CHF who presents emergency department with shortness of breath.  Appears volume overloaded on exam. Has not been taking her correct dose of Lasix. Received IV Lasix here and has been diuresing well. Chest x-ray shows pulmonary edema. Plan is to admit patient once labs are back.   1:50 AM Labs show mildly elevated troponin which appears to be baseline. BNP is 463. Discussed patient's case with hospitalist, Dr. Hal Hope.  I have recommended admission and patient (and family if present) agree with this plan. Admitting physician will place admission orders.   I reviewed all nursing notes, vitals, pertinent previous records, EKGs, lab and urine results, imaging (as available).     Ward, Delice Bison, DO 02/07/17 [phone]

## 2017-02-07 NOTE — ED Provider Notes (Signed)
[facility] DEPT Provider Note   CSN: [id number] Arrival date & time: 02/06/17  1749     History   Chief Complaint Chief Complaint  Patient presents with  . Shortness of Breath  . Weakness    HPI Kathleen [name] is a 71 y.o. female.  The history is provided by the patient and medical records.   Patient is a 71 year old female the history of coronary artery disease, chronic kidney disease, diabetes, systolic congestive heart failure who presents the emergency department with persistent shortness of breath and lower extremity swelling.  She reports decreased ability to ambulate secondary to pain and swelling in her legs.  Family reports that she's been sleeping in the recliner and is now using a bedside commode.  She was seen recently by her nephrologist and her Lasix was increased from 40 once a day to 43 times a day.  She states that she is only been taking 40 mg twice a day because of the decreased ability to get up.  She does report some moderate pain in her right knee without significant swelling in the right knee or injuries/trauma to the right knee.  No fevers or chills.  No chest pain or back pain.   Past Medical History:  Diagnosis Date  . Anemia    a. 01/2014: suspected of chronic disease, negative FOBT.  [name] CAD (coronary artery disease)    a. Cath 01/2014: mild in LAD, mild-mod LCx, mod-severe RCA - for med rx.  . CKD (chronic kidney disease), stage III    a. Borderline III-IV.  . Diabetes mellitus without complication ([place])   . History of echocardiogram    Echo (10/15):  Mod LVH, EF 50-55%, Gr 1 DD, MAC, mild MR, mild TR, PASP 35 mmHg  . Hyperlipidemia   . Hypertension   . Meningioma ([place])    a. Incidental dx 01/2014.  . Obesity   . Systolic CHF ([place])    a. 11/3327: dx with mixed ICM/NICM (out of proportion to CAD) EF 25-30% by echo.     Patient Active Problem List   Diagnosis Date Noted  . Peripheral arterial disease ([place]) 02/23/2016  . Insulin dependent  diabetes mellitus ([place]) 12/18/2015  . Anemia in chronic kidney disease 12/18/2015  . Acute renal failure superimposed on stage 4 chronic kidney disease ([place]) 08/13/2015  . Dyspnea   . Goals of care, counseling/discussion   . SOB (shortness of breath)   . Encounter for palliative care   . Pulmonary vascular congestion   . Pleural effusion, left   . Acute on chronic renal failure ([place])   . HLD (hyperlipidemia)   . Noncompliance with medication regimen   . Disorientation   . Somnolence   . Acute respiratory failure with hypercapnia ([place])   . Respiratory acidosis   . Hypernatremia   . Systolic and diastolic CHF, acute ([place])   . CHF, acute on chronic ([place]) 08/05/2015  . Acute renal failure superimposed on stage 3 chronic kidney disease ([place]) 08/05/2015  . Elevated troponin 08/05/2015  . CHF (congestive heart failure) ([place]) 08/05/2015  . Acute on chronic heart failure ([place]) 08/05/2015  . Acute on chronic combined systolic and diastolic congestive heart failure ([place])   . AKI (acute kidney injury) ([place])   . Left carotid bruit 11/20/2014  . Syncope and collapse 06/10/2014  . Syncope 06/10/2014  . Essential hypertension   . Unspecified vitamin D deficiency   . Cellulitis and abscess of right lower extremitiy 02/28/2014  . Chronic combined  systolic and diastolic heart failure ([place]) 02/12/2014  . CAD (coronary artery disease)   . Obesity   . Meningioma ([place])   . Anemia   . CKD stage 4 due to type 2 diabetes mellitus ([place]) 01/24/2014  . Hyperlipidemia 01/22/2014  . BPPV (benign paroxysmal positional vertigo) 01/22/2014  . Tobacco abuse 01/21/2014  . Diabetes mellitus due to abnormal insulin ([place]) 01/21/2014    Past Surgical History:  Procedure Laterality Date  . ABDOMINAL HYSTERECTOMY    . BREAST SURGERY    . LEFT HEART CATHETERIZATION WITH CORONARY ANGIOGRAM N/A 01/27/2014   Procedure: LEFT HEART CATHETERIZATION WITH CORONARY ANGIOGRAM;  Surgeon: Jettie Booze, MD;  Location:  [facility] CATH LAB;  Service: Cardiovascular;  Laterality: N/A;  . TONSILLECTOMY      OB History    No data available       Home Medications    Prior to Admission medications   Medication Sig Start Date End Date Taking? Authorizing Provider  aspirin 81 MG EC tablet Take 1 tablet (81 mg total) by mouth daily. 12/25/15  Yes Cherene Altes, MD  brimonidine-timolol (COMBIGAN) 0.2-0.5 % ophthalmic solution Place 1 drop into the right eye 2 (two) times daily as needed (for glaucoma).    Yes [provider]  calcitRIOL (ROCALTROL) 0.5 MCG capsule Take 0.5 mcg by mouth 3 (three) times daily. 01/07/17  Yes [provider]  calcium acetate (PHOSLO) 667 MG capsule Take 667 mg by mouth 3 (three) times daily with meals.   Yes [provider]  carvedilol (COREG) 12.5 MG tablet Take 12.5 mg by mouth 2 (two) times daily with a meal.  09/30/14  Yes [provider]  furosemide (LASIX) 40 MG tablet Take 120 mg by mouth daily. 01/10/17  Yes [provider]  hydrALAZINE (APRESOLINE) 50 MG tablet TAKE 1 TABLET BY MOUTH 3 TIMES A DAY 01/27/17  Yes Josue Hector, MD  insulin aspart (NOVOLOG) 100 UNIT/ML injection Inject 6 Units into the skin 3 (three) times daily with meals. CBG < 70: implement hypoglycemia protocol CBG 70 - 120: 0 units CBG 121 - 150: 1 unit CBG 151 - 200: 2 units CBG 201 - 250: 3 units CBG 251 - 300: 5 units CBG 301 - 350: 7 units CBG 351 - 400: 9 units CBG > 400: call MD. 12/22/15  Yes Cherene Altes, MD  insulin detemir (LEVEMIR) 100 UNIT/ML injection Inject 0.1 mLs (10 Units total) into the skin at bedtime. Patient taking differently: Inject 20 Units into the skin at bedtime.  06/12/14  Yes Hongalgi, Lenis Dickinson, MD  isosorbide mononitrate (IMDUR) 30 MG 24 hr tablet Take 1 tablet (30 mg total) by mouth daily. 06/02/16  Yes Lorretta Harp, MD  furosemide (LASIX) 80 MG tablet Take 1 tablet (80 mg total) by mouth daily. Patient not taking: Reported  on 02/06/2017 12/22/15   Cherene Altes, MD  meclizine (ANTIVERT) 12.5 MG tablet Take 1 tablet (12.5 mg total) by mouth 3 (three) times daily as needed for dizziness or nausea. For dizzy spells 12/22/15   Cherene Altes, MD  Nutritional Supplements (FEEDING SUPPLEMENT, NEPRO CARB STEADY,) LIQD Take 237 mLs by mouth 2 (two) times daily between meals. Patient not taking: Reported on 02/06/2017 08/14/15   Orson Eva, MD    Family History Family History  Problem Relation Age of Onset  . Diabetic kidney disease Mother   . Hypertension Mother   . Heart failure Mother   . Heart attack  Mother   . Hyperlipidemia Mother   . Hypertension Father     Social History Social History  Substance Use Topics  . Smoking status: Former Smoker    Packs/day: 0.25    Years: 30.00    Types: Cigarettes  . Smokeless tobacco: Never Used  . Alcohol use No     Allergies   Patient has no known allergies.   Review of Systems Review of Systems  All other systems reviewed and are negative.    Physical Exam Updated Vital Signs BP (!) 188/95   Pulse 78   Resp 17   SpO2 97%   Physical Exam  Constitutional: She is oriented to person, place, and time. She appears well-developed and well-nourished. No distress.  HENT:  Head: Normocephalic and atraumatic.  Eyes: EOM are normal.  Neck: Normal range of motion.  Cardiovascular: Normal rate and regular rhythm.   Pulmonary/Chest: Effort normal. She has rales.  Abdominal: Soft. She exhibits no distension. There is no tenderness.  Musculoskeletal: Normal range of motion. She exhibits edema.  Mild pain with range of motion the right knee.  No obvious right knee joint effusion  Neurological: She is alert and oriented to person, place, and time.  Skin: Skin is warm and dry.  Psychiatric: She has a normal mood and affect. Judgment normal.  Nursing note and vitals reviewed.    ED Treatments / Results  Labs (all labs ordered are listed, but only abnormal  results are displayed) Labs Reviewed  [place], ED    EKG ECG interpretation   Date: 02/07/2017  Rate: 77  Rhythm: normal sinus rhythm  QRS Axis: normal  Intervals: normal  ST/T Wave abnormalities: normal  Conduction Disutrbances: none  Narrative Interpretation:   Old EKG Reviewed: No significant changes noted     Radiology Dg Chest 2 View  Result Date: 02/06/2017 CLINICAL DATA:  Shortness of breath and nausea for 1 week EXAM: CHEST  2 VIEW COMPARISON:  August 06, 2015 FINDINGS: The mediastinal contour is normal heart size is enlarged the aorta is tortuous. There is mild increased pulmonary interstitium bilaterally. There is no focal pneumonia. There are small bilateral pleural effusions. The visualized skeletal structures are stable. IMPRESSION: Mild congestive heart failure. Electronically Signed   By: Abelardo Diesel M.D.   On: 02/06/2017 18:32    Procedures Procedures (including critical care time)  Medications Ordered in ED Medications  furosemide (LASIX) injection 80 mg (80 mg Intravenous Given 02/06/17 2028)     Initial Impression / Assessment and Plan / ED Course  I have reviewed the triage vital signs and the nursing notes.  Pertinent labs & imaging results that were available during my care of the patient were reviewed by me and considered in my medical decision making (see chart for details).     CHF exacerbation as well as accelerated hypertension here in emergency department.  IV Lasix now.  Blood pressure control.  Patient will need admission the hospital for failed outpatient management.  Care to Dr Leonides Schanz to follow up on blood work  Final Clinical Impressions(s) / ED Diagnoses   Final diagnoses:  None    New Prescriptions New Prescriptions   No medications on file     Jola Schmidt, MD 02/07/17 [phone]

## 2017-02-07 NOTE — ED Notes (Signed)
Critical Troponin results given to [facility]

## 2017-02-07 NOTE — Care Management Note (Signed)
Case Management Note  Patient Details  Name: Kathleen [name] MRN: [id number] Date of Birth: 09-Oct-1945  Subjective/Objective:   Admitted with CHF          Action/Plan: Patient lives at home; PCP: Benito Mccreedy, MD; has private insurance with [facility] with prescription drug coverage; patient may possibly need HHC services at discharge; CM will continue to follow for DCP  Expected Discharge Date:      Possibly 02/10/2017            Expected Discharge Plan:  [place]  Discharge planning Services  CM Consult  Status of Service:  In process, will continue to follow  Sherrilyn Rist [phone] 02/07/2017, 10:49 AM

## 2017-02-07 NOTE — Progress Notes (Signed)
[facility] PROGRESS NOTE    Progress Note  Kathleen [name]  MWU:[id number] DOB: 12/18/1945 DOA: 02/06/2017 PCP: Benito Mccreedy, MD     Brief Narrative:   Kathleen [name] is an 71 y.o. female past history of chronic kidney disease stage IV to 5 (with a baseline creatinine of 3.2-3.4, chronic combined systolic and diastolic heart failure with ejection fraction of 40-45% on December 2016, diabetes mellitus type 2 Last A1c of 7.3, comes to the ED complaining of shortness of breath and lower extremity edema. Her suite in the computer is 73-75 kg, on admission she was 81.9.  Assessment/Plan:   Acute on chronic combined systolic and diastolic congestive heart failure ([place]): Continue strict I's and O's daily weight. She's not a candidate for an ACE inhibitor due to her renal disease, she is on hydralazine and Imdur. 2-D echo is pending. We'll increase her Lasix to 80 mg every 12. Check a renal panel in the morning.  Acute kidney injury on chronic NEC stage IV: Question cardiorenal syndrome. We'll increase her IV Lasix and recheck a renal panel in the morning.  Hypertensive urgency: Improved with IV Lasix, Coreg, hydralazine, and Lipitor. We'll continue to titrate medications as tolerated. Her blood pressure is improved to 120.  Elevated cardiac marker markers: In the setting of chronic renal disease with hypertensive urgency, she denies any chest pain.    DVT prophylaxis: lovenox Family Communication:none Disposition Plan/Barrier to D/C: home in 3 days Code Status:     Code Status Orders        Start     Ordered   02/07/17 0400  Do not attempt resuscitation (DNR)  Continuous    Question Answer Comment  In the event of cardiac or respiratory ARREST Do not call a "code blue"   In the event of cardiac or respiratory ARREST Do not perform Intubation, CPR, defibrillation or ACLS   In the event of cardiac or respiratory ARREST Use medication by any route, position,  wound care, and other measures to relive pain and suffering. May use oxygen, suction and manual treatment of airway obstruction as needed for comfort.      02/07/17 0400    Code Status History    Date Active Date Inactive Code Status Order ID Comments User Context   12/18/2015  9:55 PM 12/22/2015  8:24 PM DNR [id number]  Vianne Bulls, MD ED   08/11/2015  9:24 AM 08/14/2015  8:46 PM DNR [id number]  Melton Alar, PA-C Inpatient   08/05/2015  8:52 PM 08/11/2015  9:24 AM Full Code [id number]  Radene Gunning, NP Inpatient   06/10/2014  6:53 PM 06/13/2014  7:34 PM Full Code [id number]  Kelvin Cellar, MD Inpatient   01/27/2014  5:57 PM 01/30/2014  9:35 PM Full Code [id number]  Jettie Booze, MD Inpatient   01/21/2014  2:44 PM 01/27/2014  5:57 PM Full Code [id number]  Allie Bossier, MD ED        IV Access:    Peripheral IV   Procedures and diagnostic studies:   Dg Chest 2 View  Result Date: 02/06/2017 CLINICAL DATA:  Shortness of breath and nausea for 1 week EXAM: CHEST  2 VIEW COMPARISON:  August 06, 2015 FINDINGS: The mediastinal contour is normal heart size is enlarged the aorta is tortuous. There is mild increased pulmonary interstitium bilaterally. There is no focal pneumonia. There are small bilateral pleural effusions. The visualized skeletal structures are stable. IMPRESSION: Mild congestive heart failure. Electronically Signed  By: Abelardo Diesel M.D.   On: 02/06/2017 18:32     Medical Consultants:    None.  Anti-Infectives:   None  Subjective:    Kathleen [name] she relates her breathing is better.  Objective:    Vitals:   02/07/17 0200 02/07/17 0244 02/07/17 0413 02/07/17 0536  BP: (!) 160/77 (!) 162/88 (!) 165/67 (!) 121/56  Pulse: 81 85 83 80  Resp: 19 18 18    Temp:  98.2 F (36.8 C) 98.6 F (37 C)   TempSrc:  Oral Oral   SpO2: 95% 96% 92%   Weight:  81.9 kg (180 lb 9.6 oz)    Height:  5\' 3"  (1.6 m)      Intake/Output Summary (Last 24 hours) at  02/07/17 0748 Last data filed at 02/07/17 0514  Gross per 24 hour  Intake                0 ml  Output              350 ml  Net             -350 ml   Filed Weights   02/07/17 0244  Weight: 81.9 kg (180 lb 9.6 oz)    Exam: General exam:In no acute distress length of the brain bed Respiratory system: She has good air movement with crackles in the right lower lobe Cardiovascular system: She has regular rate and rhythm with positive S1 and S2 and a positive JVD. Gastrointestinal system: Abdomen is soft nontender nondistended Central nervous system: Awake alert and oriented 3 nonfocal Extremities: 2+ edema Skin: No rashes or ulceration. Psychiatry: Judgment and insight appeared normal, mood and affect are appropriate.   Data Reviewed:    Labs: Basic Metabolic Panel:  Recent Labs Lab 02/07/17 0029  NA 139  K 4.6  CL 106  CO2 23  GLUCOSE 106*  BUN 63*  CREATININE 4.40*  CALCIUM 8.5*   GFR Estimated Creatinine Clearance: 11.7 mL/min (A) (by C-G formula based on SCr of 4.4 mg/dL (H)). Liver Function Tests: No results for input(s): AST, ALT, ALKPHOS, BILITOT, PROT, ALBUMIN in the last 168 hours. No results for input(s): LIPASE, AMYLASE in the last 168 hours. No results for input(s): AMMONIA in the last 168 hours. Coagulation profile No results for input(s): INR, PROTIME in the last 168 hours.  CBC:  Recent Labs Lab 02/07/17 0029  WBC 5.6  HGB 10.6*  HCT 35.2*  MCV 88.7  PLT 295   Cardiac Enzymes: No results for input(s): CKTOTAL, CKMB, CKMBINDEX, TROPONINI in the last 168 hours. BNP (last 3 results) No results for input(s): PROBNP in the last 8760 hours. CBG:  Recent Labs Lab 02/07/17 0733  GLUCAP 165*   D-Dimer: No results for input(s): DDIMER in the last 72 hours. Hgb A1c: No results for input(s): HGBA1C in the last 72 hours. Lipid Profile: No results for input(s): CHOL, HDL, LDLCALC, TRIG, CHOLHDL, LDLDIRECT in the last 72 hours. Thyroid function  studies: No results for input(s): TSH, T4TOTAL, T3FREE, THYROIDAB in the last 72 hours.  Invalid input(s): FREET3 Anemia work up: No results for input(s): VITAMINB12, FOLATE, FERRITIN, TIBC, IRON, RETICCTPCT in the last 72 hours. Sepsis Labs:  Recent Labs Lab 02/07/17 0029  WBC 5.6   Microbiology No results found for this or any previous visit (from the past 240 hour(s)).   Medications:   . aspirin EC  81 mg Oral Daily  . brimonidine  1 drop Right Eye BID  And  . timolol  1 drop Right Eye BID  . calcitRIOL  0.5 mcg Oral Daily  . calcium acetate  667 mg Oral TID WC  . carvedilol  12.5 mg Oral BID WC  . furosemide  60 mg Intravenous Q12H  . heparin  5,000 Units Subcutaneous Q8H  . hydrALAZINE  50 mg Oral TID  . insulin aspart  0-9 Units Subcutaneous TID WC  . insulin detemir  20 Units Subcutaneous QHS  . isosorbide mononitrate  30 mg Oral Daily   Continuous Infusions:    LOS: 0 days   Charlynne Cousins  [facility] Pager [phone]  *Please refer to [email or web address], password [email or web address] to get updated schedule on who will round on this patient, as hospitalists switch teams weekly. If 7PM-7AM, please contact night-coverage at [email or web address], password [email or web address] for any overnight needs.  02/07/2017, 7:48 AM

## 2017-02-08 ENCOUNTER — Inpatient Hospital Stay (HOSPITAL_COMMUNITY): Payer: Medicare Other

## 2017-02-08 LAB — URINALYSIS, ROUTINE W REFLEX MICROSCOPIC
Bilirubin Urine: NEGATIVE
Glucose, UA: NEGATIVE mg/dL
HGB URINE DIPSTICK: NEGATIVE
Ketones, ur: NEGATIVE mg/dL
NITRITE: POSITIVE — AB
PROTEIN: 100 mg/dL — AB
Specific Gravity, Urine: 1.009 (ref 1.005–1.030)
pH: 5 (ref 5.0–8.0)

## 2017-02-08 LAB — RENAL FUNCTION PANEL
ALBUMIN: 2.6 g/dL — AB (ref 3.5–5.0)
ANION GAP: 9 (ref 5–15)
BUN: 71 mg/dL — AB (ref 6–20)
CALCIUM: 8.2 mg/dL — AB (ref 8.9–10.3)
CO2: 25 mmol/L (ref 22–32)
Chloride: 102 mmol/L (ref 101–111)
Creatinine, Ser: 4.54 mg/dL — ABNORMAL HIGH (ref 0.44–1.00)
GFR calc Af Amer: 10 mL/min — ABNORMAL LOW (ref >60)
GFR calc non Af Amer: 9 mL/min — ABNORMAL LOW (ref >60)
GLUCOSE: 171 mg/dL — AB (ref 65–99)
PHOSPHORUS: 6 mg/dL — AB (ref 2.5–4.6)
Potassium: 4.9 mmol/L (ref 3.5–5.1)
Sodium: 136 mmol/L (ref 135–145)

## 2017-02-08 LAB — GLUCOSE, CAPILLARY
Glucose-Capillary: 161 mg/dL — ABNORMAL HIGH (ref 65–99)
Glucose-Capillary: 162 mg/dL — ABNORMAL HIGH (ref 65–99)
Glucose-Capillary: 119 mg/dL — ABNORMAL HIGH (ref 65–99)
Glucose-Capillary: 112 mg/dL — ABNORMAL HIGH (ref 65–99)
Glucose-Capillary: 264 mg/dL — ABNORMAL HIGH (ref 65–99)

## 2017-02-08 LAB — CREATININE, URINE, RANDOM: CREATININE, URINE: 44.81 mg/dL

## 2017-02-08 LAB — SODIUM, URINE, RANDOM: SODIUM UR: 91 mmol/L

## 2017-02-08 NOTE — Progress Notes (Addendum)
PROGRESS NOTE    Kathleen [name]  KGU:[id number] DOB: [birth year] DOA: 02/06/2017 PCP: Benito Mccreedy, MD    Brief Narrative: 71 year old lady with prior h/o stage 4 CKD, systolic and diastolic CHF, dm, comes inf or doe and LE edema.   Assessment & Plan:   Principal Problem:   Acute on chronic combined systolic and diastolic congestive heart failure (HCC) Active Problems:   Hypertensive urgency   CKD stage 4 due to type 2 diabetes mellitus (HCC)   CHF (congestive heart failure) (HCC)   Acute on chronic systolic and diastolic heart failure: Non compliance to meds.  Repeat ECHO done ,showed LVEF of 45%, with diffuse hypokinesis, and grade 1 diastolic dysfunction.  Not much diuresis since admission, net fluid is less than 1 liter negative.  Daily weights.  Was on lasix 80 mg BID, without much diuresis,  Renal parameters worsening on IV lasix, unclear if she is going in to cardio renal syndrome.  Pt still very sob on exertion, requiring oxygen.  Real parameters high at 4.54, slightly worsening from 4.4.    Acute on Stage 4 CKD:  ? Cardio renal syndrome.  Worsening renal parameters on Lasix,  Will call renal in am, if renal parameters continues to worsen on IV lasix.  Follows up with Dr Florene Glen.    Hypertensive urgency.  Improved .   Mild normocytic anemia:  Hemoglobin stage at 10.6   Diabetes Mellitus:  CBG (last 3)   Recent Labs  02/08/17 1123 02/08/17 1618 02/08/17 1648  GLUCAP 162* 119* 112*    Resume SSI.  No change in meds.    Hyperlipidemia: not on statin. Lipid panel in am.   CAD; No chest pain.  Resume aspirin.    Elevated troponins:  Possibly from demand ischemia from HF.      DVT prophylaxis: sq heparin.  Code Status: DNR.  Family Communication: NONE AT BEDSIDE.  Disposition Plan: pending improvement in renal parameters.    Consultants:   Renal in am.    Procedures: echocardiogram    Antimicrobials: none.     Subjective: Reports feeling dyspneic.  No chest pain.  No nausea, or vomiting.   Objective: Vitals:   02/08/17 0040 02/08/17 0422 02/08/17 1125 02/08/17 1721  BP: (!) 152/59 (!) 161/60 (!) 113/45 120/80  Pulse: 76 74 62 72  Resp: 18 18 18    Temp: 97.8 F (36.6 C) 98.4 F (36.9 C) 98.1 F (36.7 C)   TempSrc: Oral Oral Oral   SpO2: 94% 93% 97%   Weight:  85.6 kg (188 lb 12.8 oz)    Height:        Intake/Output Summary (Last 24 hours) at 02/08/17 1933 Last data filed at 02/08/17 1812  Gross per 24 hour  Intake              740 ml  Output             1350 ml  Net             -610 ml   Filed Weights   02/07/17 0244 02/08/17 0422  Weight: 81.9 kg (180 lb 9.6 oz) 85.6 kg (188 lb 12.8 oz)    Examination:  General exam: Appears  In mild distress from sob.  Respiratory system: Clear to auscultation. Respiratory effort normal. Cardiovascular system: S1 & S2 heard, RRR. No JVD, murmurs, rubs, gallops or clicks.  Gastrointestinal system: Abdomen is nondistended, soft and nontender. No organomegaly or masses felt. Normal bowel sounds  heard. Central nervous system: Alert and oriented. No focal neurological deficits. Extremities: leg edema.  Skin: No rashes, lesions or ulcers Psychiatry: Judgement and insight appear normal. Mood & affect appropriate.     Data Reviewed: I have personally reviewed following labs and imaging studies  CBC:  Recent Labs Lab 02/07/17 0029  WBC 5.6  HGB 10.6*  HCT 35.2*  MCV 88.7  PLT 272   Basic Metabolic Panel:  Recent Labs Lab 02/07/17 0029 02/08/17 0331  NA 139 136  K 4.6 4.9  CL 106 102  CO2 23 25  GLUCOSE 106* 171*  BUN 63* 71*  CREATININE 4.40* 4.54*  CALCIUM 8.5* 8.2*  PHOS  --  6.0*   GFR: Estimated Creatinine Clearance: 11.6 mL/min (A) (by C-G formula based on SCr of 4.54 mg/dL (H)). Liver Function Tests:  Recent Labs Lab 02/08/17 0331  ALBUMIN 2.6*   No results for input(s): LIPASE, AMYLASE in the last 168  hours. No results for input(s): AMMONIA in the last 168 hours. Coagulation Profile: No results for input(s): INR, PROTIME in the last 168 hours. Cardiac Enzymes:  Recent Labs Lab 02/07/17 0705 02/07/17 1120 02/07/17 1600  TROPONINI 0.06* 0.06* 0.06*   BNP (last 3 results) No results for input(s): PROBNP in the last 8760 hours. HbA1C: No results for input(s): HGBA1C in the last 72 hours. CBG:  Recent Labs Lab 02/07/17 2036 02/08/17 0735 02/08/17 1123 02/08/17 1618 02/08/17 1648  GLUCAP 243* 161* 162* 119* 112*   Lipid Profile: No results for input(s): CHOL, HDL, LDLCALC, TRIG, CHOLHDL, LDLDIRECT in the last 72 hours. Thyroid Function Tests: No results for input(s): TSH, T4TOTAL, FREET4, T3FREE, THYROIDAB in the last 72 hours. Anemia Panel: No results for input(s): VITAMINB12, FOLATE, FERRITIN, TIBC, IRON, RETICCTPCT in the last 72 hours. Sepsis Labs: No results for input(s): PROCALCITON, LATICACIDVEN in the last 168 hours.  No results found for this or any previous visit (from the past 240 hour(s)).       Radiology Studies: US Renal  Result Date: 02/08/2017 CLINICAL DATA:  Acute renal failure. EXAM: RENAL / URINARY TRACT ULTRASOUND COMPLETE COMPARISON:  09/05/2013 and CT 12/22/2015 FINDINGS: Exam somewhat limited due to patient body habitus. Right Kidney: Length: 10.4 cm. Moderate increased cortical echogenicity with mild cortical thinning. 1.8 cm cyst over the lower pole. No mass or hydronephrosis visualized. Left Kidney: Length: 9.2 cm. Moderate increased cortical echogenicity. No mass or hydronephrosis visualized. Bladder: Appears normal for degree of bladder distention. IMPRESSION: Normal size kidneys with increased cortical echogenicity which can be seen with medical renal disease. No hydronephrosis. 1.8 cm right renal cyst. Electronically Signed   By: Marin Olp M.D.   On: 02/08/2017 14:14        Scheduled Meds: . aspirin EC  81 mg Oral Daily  .  brimonidine  1 drop Right Eye BID   And  . timolol  1 drop Right Eye BID  . calcitRIOL  0.5 mcg Oral Daily  . calcium acetate  667 mg Oral TID WC  . carvedilol  12.5 mg Oral BID WC  . furosemide  80 mg Intravenous Q12H  . heparin  5,000 Units Subcutaneous Q8H  . hydrALAZINE  50 mg Oral TID  . insulin aspart  0-9 Units Subcutaneous TID WC  . insulin detemir  20 Units Subcutaneous QHS  . isosorbide mononitrate  30 mg Oral Daily   Continuous Infusions:   LOS: 1 day    Time spent: 35 minutes.     Akya Fiorello,  MD [facility] Pager [phone]  If 7PM-7AM, please contact night-coverage [email or web address] Password [facility] 02/08/2017, 7:33 PM

## 2017-02-08 NOTE — Progress Notes (Signed)
Patient with no complaints or concerns during 7pm - 7am shift. Slept during the night.   Delona Clasby, RN 

## 2017-02-09 ENCOUNTER — Inpatient Hospital Stay (HOSPITAL_COMMUNITY): Payer: Medicare Other

## 2017-02-09 DIAGNOSIS — R5383 Other fatigue: Secondary | ICD-10-CM

## 2017-02-09 DIAGNOSIS — R2243 Localized swelling, mass and lump, lower limb, bilateral: Secondary | ICD-10-CM

## 2017-02-09 DIAGNOSIS — N184 Chronic kidney disease, stage 4 (severe): Secondary | ICD-10-CM

## 2017-02-09 DIAGNOSIS — Z01810 Encounter for preprocedural cardiovascular examination: Secondary | ICD-10-CM

## 2017-02-09 DIAGNOSIS — R0602 Shortness of breath: Secondary | ICD-10-CM

## 2017-02-09 LAB — GLUCOSE, CAPILLARY
GLUCOSE-CAPILLARY: 157 mg/dL — AB (ref 65–99)
Glucose-Capillary: 83 mg/dL (ref 65–99)
Glucose-Capillary: 84 mg/dL (ref 65–99)
Glucose-Capillary: 268 mg/dL — ABNORMAL HIGH (ref 65–99)

## 2017-02-09 LAB — RENAL FUNCTION PANEL
ANION GAP: 9 (ref 5–15)
Albumin: 2.9 g/dL — ABNORMAL LOW (ref 3.5–5.0)
BUN: 81 mg/dL — ABNORMAL HIGH (ref 6–20)
CALCIUM: 8.5 mg/dL — AB (ref 8.9–10.3)
CHLORIDE: 99 mmol/L — AB (ref 101–111)
CO2: 26 mmol/L (ref 22–32)
Creatinine, Ser: 4.79 mg/dL — ABNORMAL HIGH (ref 0.44–1.00)
GFR calc Af Amer: 10 mL/min — ABNORMAL LOW (ref >60)
GFR calc non Af Amer: 8 mL/min — ABNORMAL LOW (ref >60)
GLUCOSE: 153 mg/dL — AB (ref 65–99)
Phosphorus: 5.7 mg/dL — ABNORMAL HIGH (ref 2.5–4.6)
Potassium: 4.9 mmol/L (ref 3.5–5.1)
SODIUM: 134 mmol/L — AB (ref 135–145)

## 2017-02-09 MED ORDER — FUROSEMIDE 10 MG/ML IJ SOLN
120.0000 mg | Freq: Two times a day (BID) | INTRAVENOUS | Status: DC
  Administered 2017-02-09 – 2017-02-11 (×3): 120 mg via INTRAVENOUS
  Filled 2017-02-09: qty 12
  Filled 2017-02-09: qty 10
  Filled 2017-02-09 (×3): qty 12
  Filled 2017-02-09: qty 10
  Filled 2017-02-09: qty 12

## 2017-02-09 MED ORDER — CEFAZOLIN SODIUM-DEXTROSE 1-4 GM/50ML-% IV SOLN
1.0000 g | INTRAVENOUS | Status: DC
  Filled 2017-02-09: qty 50

## 2017-02-09 NOTE — Consult Note (Signed)
Vascular and Vein Specialist of [facility]  Patient name: Kathleen [name] MRN: [id number] DOB: Jan 04, 1946 Sex: female  REASON FOR CONSULT: permanent dialysis access and TDC, consult is from Dr. Marval Regal  HPI:   Kathleen [name] is a 71 y.o. female with CKD stage IV, who presented to the [facility] ED on 03/06/2017 with worsening shortness of breath, lower extremity swelling and fatigue. She is not yet on hemodialysis. The patient is right-handed. She has never had access procedures before. She has never required temporary dialysis.  Her past medical history includes CAD, chronic diastolic and systolic congestive heart failure and insulin-dependent diabetes mellitus. Her hypertension is managed on multiple medications. He has history of CAD. Has never had CVA. She is not on blood thinners. Takes a daily aspirin. She is a former smoker.  She lives at home with her son. She ambulates with a walker without difficulty.  Past Medical History:  Diagnosis Date  . Anemia    a. 01/2014: suspected of chronic disease, negative FOBT.  [name] CAD (coronary artery disease)    a. Cath 01/2014: mild in LAD, mild-mod LCx, mod-severe RCA - for med rx.  . CKD (chronic kidney disease), stage III    a. Borderline III-IV.  . Diabetes mellitus without complication ([place])   . History of echocardiogram    Echo (10/15):  Mod LVH, EF 50-55%, Gr 1 DD, MAC, mild MR, mild TR, PASP 35 mmHg  . Hyperlipidemia   . Hypertension   . Meningioma ([place])    a. Incidental dx 01/2014.  . Obesity   . Systolic CHF ([place])    a. 0/1749: dx with mixed ICM/NICM (out of proportion to CAD) EF 25-30% by echo.     Family History  Problem Relation Age of Onset  . Diabetic kidney disease Mother   . Hypertension Mother   . Heart failure Mother   . Heart attack Mother   . Hyperlipidemia Mother   . Hypertension Father      SOCIAL HISTORY:   Social History   Social History  . Marital status: Divorced    Spouse name: N/A  . Number  of children: N/A  . Years of education: N/A   Occupational History  . Not on file.   Social History Main Topics  . Smoking status: Former Smoker    Packs/day: 0.25    Years: 30.00    Types: Cigarettes  . Smokeless tobacco: Never Used  . Alcohol use No  . Drug use: No  . Sexual activity: No   Other Topics Concern  . Not on file   Social History Narrative  . No narrative on file    No Known Allergies  MEDICATIONS:    Current Facility-Administered Medications  Medication Dose Route Frequency Provider Last Rate Last Dose  . acetaminophen (TYLENOL) tablet 650 mg  650 mg Oral Q6H PRN Rise Patience, MD       Or  . acetaminophen (TYLENOL) suppository 650 mg  650 mg Rectal Q6H PRN Rise Patience, MD      . aspirin EC tablet 81 mg  81 mg Oral Daily Rise Patience, MD   81 mg at 02/09/17 0836  . brimonidine (ALPHAGAN) 0.2 % ophthalmic solution 1 drop  1 drop Right Eye BID Rise Patience, MD   1 drop at 02/09/17 4496   And  . timolol (TIMOPTIC) 0.5 % ophthalmic solution 1 drop  1 drop Right Eye BID Rise Patience, MD   1  drop at 02/09/17 0838  . calcitRIOL (ROCALTROL) capsule 0.5 mcg  0.5 mcg Oral Daily Rise Patience, MD   0.5 mcg at 02/09/17 0836  . calcium acetate (PHOSLO) capsule 667 mg  667 mg Oral TID WC Rise Patience, MD   667 mg at 02/09/17 1610  . carvedilol (COREG) tablet 12.5 mg  12.5 mg Oral BID WC Rise Patience, MD   12.5 mg at 02/09/17 9604  . furosemide (LASIX) 120 mg in dextrose 5 % 50 mL IVPB  120 mg Intravenous Q12H Coladonato, Broadus John, MD      . heparin injection 5,000 Units  5,000 Units Subcutaneous Q8H Rise Patience, MD   5,000 Units at 02/09/17 [phone]  . hydrALAZINE (APRESOLINE) tablet 50 mg  50 mg Oral TID Rise Patience, MD   50 mg at 02/09/17 0840  . insulin aspart (novoLOG) injection 0-9 Units  0-9 Units Subcutaneous TID WC Rise Patience, MD   2 Units at 02/08/17 1205  . insulin detemir  (LEVEMIR) injection 20 Units  20 Units Subcutaneous QHS Rise Patience, MD   20 Units at 02/08/17 2200  . isosorbide mononitrate (IMDUR) 24 hr tablet 30 mg  30 mg Oral Daily Rise Patience, MD   30 mg at 02/09/17 0836  . meclizine (ANTIVERT) tablet 12.5 mg  12.5 mg Oral TID PRN Rise Patience, MD        REVIEW OF SYSTEMS:    REVIEW OF SYSTEMS (negative unless checked):   Cardiac:  _0  Chest pain or chest pressure? _1  Shortness of breath upon activity? _2  Shortness of breath when lying flat? _3  Irregular heart rhythm?  Vascular:  _4  Pain in calf, thigh, or hip brought on by walking? _5  Pain in feet at night that wakes you up from your sleep? _6  Blood clot in your veins? _7  Leg swelling?  Pulmonary:  _8  Oxygen at home? _9  Productive cough? _10  Wheezing?  Neurologic:  _11  Sudden weakness in arms or legs? _12  Sudden numbness in arms or legs? _13  Sudden onset of difficult speaking or slurred speech? _14  Temporary loss of vision in one eye? _15  Problems with dizziness?  Gastrointestinal:  _16  Blood in stool? _17  Vomited blood?  Genitourinary:  _18  Burning when urinating? _19  Blood in urine?  Psychiatric:  _20  Major depression  Hematologic:  _21  Bleeding problems? _22  Problems with blood clotting?  Dermatologic:  _23  Rashes or ulcers?  Constitutional:  _24  Fever or chills?  Ear/Nose/Throat:  _25  Change in hearing? _26  Nose bleeds? _27  Sore throat?  Musculoskeletal:  _28  Back pain? _29  Joint pain? _30  Muscle pain?  PHYSICAL EXAM:    Vitals:   02/08/17 1721 02/08/17 2009 02/08/17 2155 02/09/17 0735  BP: 120/80 (!) 143/54 (!) 148/55 (!) 147/60  Pulse: 72 64 70 69  Resp:  18  17  Temp:  97.8 F (36.6 C)  97.9 F (36.6 C)  TempSrc:  Oral  Oral  SpO2:  98%  99%  Weight:    188 lb 12.8 oz (85.6 kg)  Height:        GENERAL: The patient is a well-nourished female, in no acute distress. The vital signs are documented above. HEENT: normocephalic,  atraumatic, no abnormalities noted.  CARDIAC: There is a regular rate and rhythm. No carotid bruits. VASCULAR: 1+ right radial pulse, 2+ right ulnar pulse, 2+ right brachial pulse. Left 2+ radial, ulnar and brachial pulses. Nonpalpable pedal pulses. Feet are warm. PULMONARY: Nonlabored respiratory effort. Bilateral rales in lower  lung fields. MUSCULOSKELETAL: Lower extremity swelling bilaterally. NEUROLOGIC: No focal weakness or paresthesias are detected. SKIN: There are no ulcers or rashes noted. PSYCHIATRIC: The patient has a normal affect.  DATA:    TTE 03/06/2017  LVEF: 45%-50% - Left ventricle: The cavity size was normal. Wall thickness was   increased in a pattern of moderate LVH. Systolic function was   mildly reduced. The estimated ejection fraction was in the range   of 45% to 50%. Diffuse hypokinesis. Doppler parameters are   consistent with abnormal left ventricular relaxation (grade 1   diastolic dysfunction). The E/e&' ratio is >15, suggesting   elevated LV filling pressure. - Aortic valve: Sclerosis without stenosis. There was no   regurgitation. - Mitral valve: Calcified annulus. Mildly thickened leaflets .   There was mild regurgitation. - Left atrium: The atrium was moderately dilated. - Right ventricle: The cavity size was mildly dilated. Systolic   function was normal. - Right atrium: The atrium was mildly dilated. - Tricuspid valve: There was moderate regurgitation. - Pulmonary arteries: PA peak pressure: 52 mm Hg (S). - Inferior vena cava: The vessel was dilated. The respirophasic   diameter changes were blunted (< 50%), consistent with elevated   central venous pressure.  ASSESSMENT/PLAN:     CKD stage V  The patient is right-handed. We will obtain vein mapping today. Plan for [facility] and permanent access tomorrow. We'll make permanent access recommendations based on vein mapping. The procedure, risks and benefits were discussed with the patient at length.  She is willing to proceed.  Virgina Jock, PA-C [facility] Special[facility] of [place] [phone]    I have independently interviewed patient and agree with PA assessment and plan above. Possibly has sufficient left arm cephalic vein for avf or will need avg. Plan for tomorrow.   Alvie Fowles C. Donzetta Matters, MD [facility] [place] Office: [phone] Pager: [phone]

## 2017-02-09 NOTE — Progress Notes (Signed)
PROGRESS NOTE    Kathleen [name]  PXT:[id number] DOB: 1945/08/29 DOA: 02/06/2017 PCP: Benito Mccreedy, MD    Brief Narrative: 71 year old lady with prior h/o stage 4 CKD, systolic and diastolic CHF, dm, comes in for doe and LE edema.   Assessment & Plan:   Principal Problem:   Acute on chronic combined systolic and diastolic congestive heart failure (HCC) Active Problems:   Hypertensive urgency   CKD stage 4 due to type 2 diabetes mellitus (HCC)   CHF (congestive heart failure) (HCC)   Acute on chronic systolic and diastolic heart failure: Non compliance to meds.  Repeat ECHO done ,showed LVEF of 45%, with diffuse hypokinesis, and grade 1 diastolic dysfunction.  Not much diuresis since admission, net fluid is less than 1 liter negative.  Daily weights. Strict intake and output. weaned her off the oxygen.  Was on lasix 80 mg BID, without much diuresis, changed to Lasix gtt.     Acute on Stage 4 CKD:  ? Cardio renal syndrome.  Worsening renal parameters, nephrology consulted, plan for vein mapping, vascular surgery consult , placement of HD cath and possibly AVF.     Hypertensive urgency.  Improved .   Mild normocytic anemia: anemia of chronic disease.  Hemoglobin stage at 10.6, monitor.    Diabetes Mellitus:  CBG (last 3)   Recent Labs  02/09/17 0745 02/09/17 1151 02/09/17 1638  GLUCAP 83 157* 84    Resume SSI.  No change in meds.    Hyperlipidemia: not on statin.  CAD; No chest pain.  Resume aspirin.    Elevated troponins:  Possibly from demand ischemia from HF.   Protein malnutrition:  Dietary consulted.    DVT prophylaxis: sq heparin.  Code Status: DNR.  Family Communication: NONE AT BEDSIDE.  Disposition Plan: pending improvement in renal parameters.    Consultants:   Renal in am.   Vascular consult   Procedures: echocardiogram    Antimicrobials: none.    Subjective: Breathing better, no chest pain.  No headache or nausea  or vomiting.   Objective: Vitals:   02/08/17 2155 02/09/17 0735 02/09/17 1147 02/09/17 1347  BP: (!) 148/55 (!) 147/60 (!) 128/46   Pulse: 70 69 62 68  Resp:  17 17   Temp:  97.9 F (36.6 C) 98.2 F (36.8 C)   TempSrc:  Oral Oral   SpO2:  99% 100% 95%  Weight:  85.6 kg (188 lb 12.8 oz)    Height:        Intake/Output Summary (Last 24 hours) at 02/09/17 1709 Last data filed at 02/09/17 1412  Gross per 24 hour  Intake              920 ml  Output             1250 ml  Net             -330 ml   Filed Weights   02/07/17 0244 02/08/17 0422 02/09/17 0735  Weight: 81.9 kg (180 lb 9.6 oz) 85.6 kg (188 lb 12.8 oz) 85.6 kg (188 lb 12.8 oz)    Examination:  General exam: Appears comfortable Respiratory system: Clear to auscultation. Respiratory effort normal. No wheezing or rhonchi.  Cardiovascular system: S1 & S2 heard, RRR. No JVD, murmurs, rubs, gallops or clicks.  Gastrointestinal system: Abdomen is soft non tender non distended bowel sounds heard.  Central nervous system: Alert and oriented. No focal neurological deficits. Extremities: 2+ leg edema.  Skin: No  rashes, lesions or ulcers Psychiatry: Judgement and insight appear normal. Mood & affect appropriate.     Data Reviewed: I have personally reviewed following labs and imaging studies  CBC:  Recent Labs Lab 02/07/17 0029  WBC 5.6  HGB 10.6*  HCT 35.2*  MCV 88.7  PLT 767   Basic Metabolic Panel:  Recent Labs Lab 02/07/17 0029 02/08/17 0331 02/09/17 0320  NA 139 136 134*  K 4.6 4.9 4.9  CL 106 102 99*  CO2 23 25 26   GLUCOSE 106* 171* 153*  BUN 63* 71* 81*  CREATININE 4.40* 4.54* 4.79*  CALCIUM 8.5* 8.2* 8.5*  PHOS  --  6.0* 5.7*   GFR: Estimated Creatinine Clearance: 11 mL/min (A) (by C-G formula based on SCr of 4.79 mg/dL (H)). Liver Function Tests:  Recent Labs Lab 02/08/17 0331 02/09/17 0320  ALBUMIN 2.6* 2.9*   No results for input(s): LIPASE, AMYLASE in the last 168 hours. No results  for input(s): AMMONIA in the last 168 hours. Coagulation Profile: No results for input(s): INR, PROTIME in the last 168 hours. Cardiac Enzymes:  Recent Labs Lab 02/07/17 0705 02/07/17 1120 02/07/17 1600  TROPONINI 0.06* 0.06* 0.06*   BNP (last 3 results) No results for input(s): PROBNP in the last 8760 hours. HbA1C: No results for input(s): HGBA1C in the last 72 hours. CBG:  Recent Labs Lab 02/08/17 1648 02/08/17 2139 02/09/17 0745 02/09/17 1151 02/09/17 1638  GLUCAP 112* 264* 83 157* 84   Lipid Profile: No results for input(s): CHOL, HDL, LDLCALC, TRIG, CHOLHDL, LDLDIRECT in the last 72 hours. Thyroid Function Tests: No results for input(s): TSH, T4TOTAL, FREET4, T3FREE, THYROIDAB in the last 72 hours. Anemia Panel: No results for input(s): VITAMINB12, FOLATE, FERRITIN, TIBC, IRON, RETICCTPCT in the last 72 hours. Sepsis Labs: No results for input(s): PROCALCITON, LATICACIDVEN in the last 168 hours.  No results found for this or any previous visit (from the past 240 hour(s)).       Radiology Studies: US Renal  Result Date: 02/08/2017 CLINICAL DATA:  Acute renal failure. EXAM: RENAL / URINARY TRACT ULTRASOUND COMPLETE COMPARISON:  09/05/2013 and CT 12/22/2015 FINDINGS: Exam somewhat limited due to patient body habitus. Right Kidney: Length: 10.4 cm. Moderate increased cortical echogenicity with mild cortical thinning. 1.8 cm cyst over the lower pole. No mass or hydronephrosis visualized. Left Kidney: Length: 9.2 cm. Moderate increased cortical echogenicity. No mass or hydronephrosis visualized. Bladder: Appears normal for degree of bladder distention. IMPRESSION: Normal size kidneys with increased cortical echogenicity which can be seen with medical renal disease. No hydronephrosis. 1.8 cm right renal cyst. Electronically Signed   By: Marin Olp M.D.   On: 02/08/2017 14:14        Scheduled Meds: . aspirin EC  81 mg Oral Daily  . brimonidine  1 drop Right Eye  BID   And  . timolol  1 drop Right Eye BID  . calcitRIOL  0.5 mcg Oral Daily  . calcium acetate  667 mg Oral TID WC  . carvedilol  12.5 mg Oral BID WC  . heparin  5,000 Units Subcutaneous Q8H  . hydrALAZINE  50 mg Oral TID  . insulin aspart  0-9 Units Subcutaneous TID WC  . insulin detemir  20 Units Subcutaneous QHS  . isosorbide mononitrate  30 mg Oral Daily   Continuous Infusions: . [START ON 02/10/2017]  ceFAZolin (ANCEF) IV    . furosemide       LOS: 2 days    Time spent: 45  minutes.     Hosie Poisson, MD [facility] Pager [phone]  If 7PM-7AM, please contact night-coverage [email or web address] Password [email or web address] 02/09/2017, 5:09 PM

## 2017-02-09 NOTE — Consult Note (Signed)
[facility] Nurse wound consult note Reason for Consult: Consult requested for right leg.  A  blister which was previously noted in the EMR has ruptured and evolved into a partial thickness stasis ulcer. Measurement: 1.5X1.5X.1cm Wound bed: red and moist Drainage (amount, consistency, odor) scant amt yellow drainage, no odor Periwound: intact skin surrounding  Dressing procedure/placement/frequency: Foam dressing to protect and promote healing.  No family present to discuss plan of care. Please re-consult if further assistance is needed.  Thank-you,  Julien Girt MSN, [facility] [place], [place], [place], [place]

## 2017-02-09 NOTE — Progress Notes (Signed)
Pt is scheduled for tunneled catheter tomorrow am for possible HD, pt is aware , consent has been taken, will continue to monitor

## 2017-02-09 NOTE — Progress Notes (Signed)
Right  Upper Extremity Vein Map  Basilic  Segment Diameter Depth Comment  1. Axilla 3.1 mm 11.2 mm   2. Mid upper arm 2.2 mm 17.6 mm   3. Above AC 2.4 mm 16.6 mm   4. In AC 2.7 mm 4.4 mm   5. Below AC 1.8 mm 2.2 mm   6. Mid forearm 1.4 mm 1.7 mm   7. Wrist 1 mm 2.5 mm     Cephalic  Segment Diameter Depth Comment  1. Axilla 3 mm 12 mm   2. Mid upper arm 1.6 mm 14 mm Branch  3. Above AC 1.6 mm 12 mm   4. In AC 1.6 mm 8.2 mm   5. Below AC mm mm  No vis, IV  6. Mid forearm 1.7 mm 8.8 mm   7. Wrist 1.3 mm 3 mm     Left Upper Extremity Vein Map  Basilic  Segment Diameter Depth Comment  1. Axilla 2 mm 21 mm   2. Mid upper arm 2.8 mm 20.1 mm   3. Above AC 1.9 mm 14.3 mm   4. In AC 1.7 mm 13.4 mm   5. Below AC 1.6 mm 8.2 mm   6. Mid forearm 0.45mm 2.7 mm   7. Wrist mm mm No vis    Cephalic  Segment Diameter Depth Comment  1. Axilla 2.8 mm 13 mm   2. Mid upper arm 3.1 mm 10.3 mm Branch  3. Above AC 3.7 mm 4 mm   4. In AC 1.7 mm 6.3 mm   5. Below AC 1.6 mm 7.8 mm   6. Mid forearm 1.3 mm 7 mm   7. Wrist 1.2 mm 2.9 mm

## 2017-02-09 NOTE — Evaluation (Signed)
Physical Therapy Evaluation Patient Details Name: Kathleen [name] MRN: [id number] DOB: 02/10/1946 Today's Date: 02/09/2017   History of Present Illness  71 yo admitted with SOb and bil LE edema, CHF. PMHx: CKD, CAD, HTN, CHF, DM  Clinical Impression  Pt is pleasant and agreeable to mobilize of out recliner chair. Pt states she has not been walking much at all around her home PTA. Pt able to ambulate with RW only a short distance before becoming fatigued. During ambulation, pt c/o weakness and pain in bil knees. Pt presents with deficits listed below in PT problem list and will benefit from continued acute therapy for strengthening and ambulation for increased activity tolerance and improved mobility.     Follow Up Recommendations SNF;Supervision/Assistance - 24 hour    Equipment Recommendations  None recommended by PT    Recommendations for Other Services       Precautions / Restrictions Precautions Precautions: None      Mobility  Bed Mobility                  Transfers Overall transfer level: Needs assistance   Transfers: Sit to/from Stand Sit to Stand: Min guard         General transfer comment: Min guard for safety   Ambulation/Gait Ambulation/Gait assistance: Min guard Ambulation Distance (Feet): 40 Feet Assistive device: Rolling walker (2 wheeled) Gait Pattern/deviations: Step-to pattern;Decreased stride length;Trunk flexed   Gait velocity interpretation: Below normal speed for age/gender General Gait Details: Pt ambulates very slowly using RW and reported fatigue quickly. She states she hardly walked at home PTA.   Stairs            Wheelchair Mobility    Modified Rankin (Stroke Patients Only)       Balance Overall balance assessment: No apparent balance deficits (not formally assessed)                                           Pertinent Vitals/Pain Pain Assessment: No/denies pain    Home Living Family/patient  expects to be discharged to:: Private residence Living Arrangements: Children Available Help at Discharge: Family;Available PRN/intermittently Type of Home: House Home Access: Stairs to enter   Entrance Stairs-Number of Steps: 1 Home Layout: One level Home Equipment: Walker - 2 wheels;Bedside commode;Cane - single point;Shower seat - built in Additional Comments: pt reports son or daughter are normally there or checking in    Prior Function Level of Independence: Independent with assistive device(s)         Comments: uses RW at home, family does the homemaking, pt bathes with daughter present for supervision, dresses independently     Hand Dominance        Extremity/Trunk Assessment   Upper Extremity Assessment Upper Extremity Assessment: Overall WFL for tasks assessed    Lower Extremity Assessment Lower Extremity Assessment: Generalized weakness    Cervical / Trunk Assessment Cervical / Trunk Assessment: Kyphotic  Communication   Communication: No difficulties  Cognition Arousal/Alertness: Awake/alert Behavior During Therapy: WFL for tasks assessed/performed Overall Cognitive Status: Within Functional Limits for tasks assessed                                        General Comments      Exercises     Assessment/Plan  PT Assessment Patient needs continued PT services  PT Problem List Decreased strength;Decreased mobility;Cardiopulmonary status limiting activity;Decreased activity tolerance;Obesity;Pain;Decreased balance       PT Treatment Interventions DME instruction;Gait training;Stair training;Functional mobility training;Therapeutic activities;Therapeutic exercise    PT Goals (Current goals can be found in the Care Plan section)  Acute Rehab PT Goals Patient Stated Goal: To go to SNF before returning home  PT Goal Formulation: With patient Time For Goal Achievement: 02/23/17 Potential to Achieve Goals: Good    Frequency Min  3X/week   Barriers to discharge        Co-evaluation               AM-PAC PT "6 Clicks" Daily Activity  Outcome Measure Difficulty turning over in bed (including adjusting bedclothes, sheets and blankets)?: A Lot Difficulty moving from lying on back to sitting on the side of the bed? : Total Difficulty sitting down on and standing up from a chair with arms (e.g., wheelchair, bedside commode, etc,.)?: Total Help needed moving to and from a bed to chair (including a wheelchair)?: A Little Help needed walking in hospital room?: A Little Help needed climbing 3-5 steps with a railing? : A Lot 6 Click Score: 12    End of Session Equipment Utilized During Treatment: Gait belt Activity Tolerance: Patient limited by fatigue Patient left: in chair;with call bell/phone within reach;with chair alarm set Nurse Communication: Mobility status PT Visit Diagnosis: Other abnormalities of gait and mobility (R26.89);Muscle weakness (generalized) (M62.81)    Time: 1341-1401 PT Time Calculation (min) (ACUTE ONLY): 20 min   Charges:   PT Evaluation $PT Eval Moderate Complexity: 1 Procedure     PT G Codes:        Elberta Leatherwood, SPT Acute Rehab [place] 02/09/2017, 2:28 PM

## 2017-02-09 NOTE — Consult Note (Signed)
Reason for Consult: AKI/CKD stage 4-5 Referring Physician: Karleen Hampshire, MD  Kathleen [name] is an 71 y.o. female.  HPI: Pt is a 71yo AAF with PMH signficant for DM, HTN, chronic combined systolic and diastolic CHF, and CKD stage 4-5 who presented to [facility] ED on 02/06/17 with increasing SOB and lower extremity edema.  Her lasix dose was increased 2 weeks prior to admission without benefit and was admitted for acute on chronic chf and IV diuresis.  We were consulted due to her rising BUN/Cr and poor response to IV lasix.  She initially had declined HD in January 2017, however she has followed up with Dr. Florene Glen and acquiesced and was set up for vascular access evaluation on 03/15/17.  She reports that she is amenable to proceed with dialysis and access placement at this time.  The trend in Scr is seen below.   She denies any N/V but does have some anorexia, SOB, and DOE, as well as lower extremity edema. She also reports sleeping in a recliner prior to admission.  Trend in Creatinine: Creatinine, Ser  Date/Time Value Ref Range Status  02/09/2017 03:20 AM 4.79 (H) 0.44 - 1.00 mg/dL Final  02/08/2017 03:31 AM 4.54 (H) 0.44 - 1.00 mg/dL Final  02/07/2017 12:29 AM 4.40 (H) 0.44 - 1.00 mg/dL Final  12/22/2015 04:09 AM 3.21 (H) 0.44 - 1.00 mg/dL Final  12/21/2015 02:14 AM 3.14 (H) 0.44 - 1.00 mg/dL Final  12/20/2015 03:56 AM 3.43 (H) 0.44 - 1.00 mg/dL Final  12/19/2015 02:00 AM 3.62 (H) 0.44 - 1.00 mg/dL Final  12/18/2015 07:10 PM 3.51 (H) 0.44 - 1.00 mg/dL Final  08/14/2015 07:07 AM 3.17 (H) 0.44 - 1.00 mg/dL Final  08/13/2015 05:00 AM 3.30 (H) 0.44 - 1.00 mg/dL Final  08/12/2015 12:20 PM 3.57 (H) 0.44 - 1.00 mg/dL Final  08/11/2015 02:27 AM 3.71 (H) 0.44 - 1.00 mg/dL Final  08/10/2015 02:34 AM 3.73 (H) 0.44 - 1.00 mg/dL Final  08/09/2015 02:16 AM 3.81 (H) 0.44 - 1.00 mg/dL Final  08/08/2015 03:24 AM 3.73 (H) 0.44 - 1.00 mg/dL Final  08/07/2015 05:05 AM 3.60 (H) 0.44 - 1.00 mg/dL Final  08/06/2015 09:40 AM  3.82 (H) 0.44 - 1.00 mg/dL Final  08/06/2015 03:00 AM 3.80 (H) 0.44 - 1.00 mg/dL Final  08/05/2015 11:30 AM 3.78 (H) 0.44 - 1.00 mg/dL Final  05/07/2015 02:59 PM 2.52 (H) 0.44 - 1.00 mg/dL Final  06/12/2014 03:29 AM 2.20 (H) 0.50 - 1.10 mg/dL Final  06/11/2014 06:59 AM 2.32 (H) 0.50 - 1.10 mg/dL Final  06/10/2014 08:20 PM 2.16 (H) 0.50 - 1.10 mg/dL Final  06/10/2014 02:54 PM 2.09 (H) 0.50 - 1.10 mg/dL Final  03/27/2014 10:45 AM 2.3 (H) 0.4 - 1.2 mg/dL Final  03/03/2014 11:00 AM 2.2 (H) 0.4 - 1.2 mg/dL Final  02/12/2014 12:22 PM 2.2 (H) 0.4 - 1.2 mg/dL Final  01/30/2014 04:35 AM 2.02 (H) 0.50 - 1.10 mg/dL Final  01/29/2014 04:06 AM 2.20 (H) 0.50 - 1.10 mg/dL Final  01/28/2014 02:24 AM 1.82 (H) 0.50 - 1.10 mg/dL Final  01/27/2014 04:53 AM 1.86 (H) 0.50 - 1.10 mg/dL Final  01/26/2014 05:30 AM 1.88 (H) 0.50 - 1.10 mg/dL Final  01/25/2014 03:20 PM 1.88 (H) 0.50 - 1.10 mg/dL Final  01/24/2014 03:34 AM 2.09 (H) 0.50 - 1.10 mg/dL Final  01/23/2014 10:45 AM 2.13 (H) 0.50 - 1.10 mg/dL Final  01/22/2014 04:53 AM 2.05 (H) 0.50 - 1.10 mg/dL Final  01/21/2014 10:25 AM 2.00 (H) 0.50 - 1.10 mg/dL Final  01/21/2014 10:25 AM 2.00 (H) 0.50 - 1.10 mg/dL Final  01/21/2014 10:20 AM 1.86 (H) 0.50 - 1.10 mg/dL Final  09/01/2008 10:00 AM 1.09 0.4 - 1.2 mg/dL Final    PMH:   Past Medical History:  Diagnosis Date  . Anemia    a. 01/2014: suspected of chronic disease, negative FOBT.  [name] CAD (coronary artery disease)    a. Cath 01/2014: mild in LAD, mild-mod LCx, mod-severe RCA - for med rx.  . CKD (chronic kidney disease), stage III    a. Borderline III-IV.  . Diabetes mellitus without complication ([place])   . History of echocardiogram    Echo (10/15):  Mod LVH, EF 50-55%, Gr 1 DD, MAC, mild MR, mild TR, PASP 35 mmHg  . Hyperlipidemia   . Hypertension   . Meningioma ([place])    a. Incidental dx 01/2014.  . Obesity   . Systolic CHF ([place])    a. 01/8340: dx with mixed ICM/NICM (out of proportion to CAD) EF 25-30%  by echo.     PSH:   Past Surgical History:  Procedure Laterality Date  . ABDOMINAL HYSTERECTOMY    . BREAST SURGERY    . LEFT HEART CATHETERIZATION WITH CORONARY ANGIOGRAM N/A 01/27/2014   Procedure: LEFT HEART CATHETERIZATION WITH CORONARY ANGIOGRAM;  Surgeon: Jettie Booze, MD;  Location: [facility] CATH LAB;  Service: Cardiovascular;  Laterality: N/A;  . TONSILLECTOMY      Allergies: No Known Allergies  Medications:   Prior to Admission medications   Medication Sig Start Date End Date Taking? Authorizing Provider  aspirin 81 MG EC tablet Take 1 tablet (81 mg total) by mouth daily. 12/25/15  Yes Cherene Altes, MD  brimonidine-timolol (COMBIGAN) 0.2-0.5 % ophthalmic solution Place 1 drop into the right eye 2 (two) times daily as needed (for glaucoma).    Yes [provider]  calcitRIOL (ROCALTROL) 0.5 MCG capsule Take 0.5 mcg by mouth 3 (three) times daily. 01/07/17  Yes [provider]  calcium acetate (PHOSLO) 667 MG capsule Take 667 mg by mouth 3 (three) times daily with meals.   Yes [provider]  carvedilol (COREG) 12.5 MG tablet Take 12.5 mg by mouth 2 (two) times daily with a meal.  09/30/14  Yes [provider]  furosemide (LASIX) 40 MG tablet Take 120 mg by mouth daily. 01/10/17  Yes [provider]  hydrALAZINE (APRESOLINE) 50 MG tablet TAKE 1 TABLET BY MOUTH 3 TIMES A DAY 01/27/17  Yes Josue Hector, MD  insulin aspart (NOVOLOG) 100 UNIT/ML injection Inject 6 Units into the skin 3 (three) times daily with meals. CBG < 70: implement hypoglycemia protocol CBG 70 - 120: 0 units CBG 121 - 150: 1 unit CBG 151 - 200: 2 units CBG 201 - 250: 3 units CBG 251 - 300: 5 units CBG 301 - 350: 7 units CBG 351 - 400: 9 units CBG > 400: call MD. 12/22/15  Yes Cherene Altes, MD  insulin detemir (LEVEMIR) 100 UNIT/ML injection Inject 0.1 mLs (10 Units total) into the skin at bedtime. Patient taking differently: Inject 20 Units into the skin  at bedtime.  06/12/14  Yes Hongalgi, Lenis Dickinson, MD  isosorbide mononitrate (IMDUR) 30 MG 24 hr tablet Take 1 tablet (30 mg total) by mouth daily. 06/02/16  Yes Lorretta Harp, MD  furosemide (LASIX) 80 MG tablet Take 1 tablet (80 mg total) by mouth daily. Patient not taking: Reported on 02/06/2017 12/22/15   Cherene Altes, MD  meclizine (ANTIVERT) 12.5 MG tablet Take 1 tablet (12.5 mg total) by mouth 3 (three) times daily as needed for dizziness or nausea. For dizzy spells 12/22/15   Cherene Altes, MD  Nutritional Supplements (FEEDING SUPPLEMENT, NEPRO CARB STEADY,) LIQD Take 237 mLs by mouth 2 (two) times daily between meals. Patient not taking: Reported on 02/06/2017 08/14/15   Orson Eva, MD    Inpatient medications: . aspirin EC  81 mg Oral Daily  . brimonidine  1 drop Right Eye BID   And  . timolol  1 drop Right Eye BID  . calcitRIOL  0.5 mcg Oral Daily  . calcium acetate  667 mg Oral TID WC  . carvedilol  12.5 mg Oral BID WC  . furosemide  80 mg Intravenous Q12H  . heparin  5,000 Units Subcutaneous Q8H  . hydrALAZINE  50 mg Oral TID  . insulin aspart  0-9 Units Subcutaneous TID WC  . insulin detemir  20 Units Subcutaneous QHS  . isosorbide mononitrate  30 mg Oral Daily    Discontinued Meds:   Medications Discontinued During This Encounter  Medication Reason  . simvastatin (ZOCOR) 40 MG tablet Patient Preference  . hydrALAZINE (APRESOLINE) injection 10 mg   . calcitRIOL (ROCALTROL) capsule 0.5 mcg   . brimonidine-timolol (COMBIGAN) 0.2-0.5 % ophthalmic solution 1 drop   . furosemide (LASIX) injection 60 mg   . hydrALAZINE (APRESOLINE) injection 10 mg     Social History:  reports that she has quit smoking. Her smoking use included Cigarettes. She has a 7.50 pack-year smoking history. She has never used smokeless tobacco. She reports that she does not drink alcohol or use drugs.  Family History:   Family History  Problem Relation Age of Onset  . Diabetic kidney disease  Mother   . Hypertension Mother   . Heart failure Mother   . Heart attack Mother   . Hyperlipidemia Mother   . Hypertension Father     Pertinent items are noted in HPI. Weight change:   Intake/Output Summary (Last 24 hours) at 02/09/17 1035 Last data filed at 02/09/17 0800  Gross per 24 hour  Intake              880 ml  Output             1450 ml  Net             -570 ml   BP (!) 147/60 (BP Location: Right Arm)   Pulse 69   Temp 97.9 F (36.6 C) (Oral)   Resp 17   Ht 5' 3"  (1.6 m)   Wt 85.6 kg (188 lb 12.8 oz) Comment: Scale C  SpO2 99%   BMI 33.44 kg/m  Vitals:   02/08/17 1721 02/08/17 2009 02/08/17 2155 02/09/17 0735  BP: 120/80 (!) 143/54 (!) 148/55 (!) 147/60  Pulse: 72 64 70 69  Resp:  18  17  Temp:  97.8 F (36.6 C)  97.9 F (36.6 C)  TempSrc:  Oral  Oral  SpO2:  98%  99%  Weight:    85.6 kg (188 lb 12.8 oz)  Height:         General appearance: cooperative, fatigued and mild distress Head: Normocephalic, without obvious abnormality, atraumatic Resp: rales bibasilar Cardio: regular rate and rhythm and no rub GI: soft, non-tender; bowel sounds normal; no masses,  no organomegaly Extremitietrace to 1 + pitting edema lower extremities L>R  Labs: Basic Metabolic Panel:  Recent Labs Lab 02/07/17 0029  02/08/17 0331 02/09/17 0320  NA 139 136 134*  K 4.6 4.9 4.9  CL 106 102 99*  CO2 23 25 26   GLUCOSE 106* 171* 153*  BUN 63* 71* 81*  CREATININE 4.40* 4.54* 4.79*  ALBUMIN  --  2.6* 2.9*  CALCIUM 8.5* 8.2* 8.5*  PHOS  --  6.0* 5.7*   Liver Function Tests:  Recent Labs Lab 02/08/17 0331 02/09/17 0320  ALBUMIN 2.6* 2.9*   No results for input(s): LIPASE, AMYLASE in the last 168 hours. No results for input(s): AMMONIA in the last 168 hours. CBC:  Recent Labs Lab 02/07/17 0029  WBC 5.6  HGB 10.6*  HCT 35.2*  MCV 88.7  PLT 295   PT/INR: @LABRCNTIP (inr:5) Cardiac Enzymes: ) Recent Labs Lab 02/07/17 0705 02/07/17 1120 02/07/17 1600   TROPONINI 0.06* 0.06* 0.06*   CBG:  Recent Labs Lab 02/08/17 1123 02/08/17 1618 02/08/17 1648 02/08/17 2139 02/09/17 0745  GLUCAP 162* 119* 112* 264* 83    Iron Studies: No results for input(s): IRON, TIBC, TRANSFERRIN, FERRITIN in the last 168 hours.  Xrays/Other Studies: US Renal  Result Date: 02/08/2017 CLINICAL DATA:  Acute renal failure. EXAM: RENAL / URINARY TRACT ULTRASOUND COMPLETE COMPARISON:  09/05/2013 and CT 12/22/2015 FINDINGS: Exam somewhat limited due to patient body habitus. Right Kidney: Length: 10.4 cm. Moderate increased cortical echogenicity with mild cortical thinning. 1.8 cm cyst over the lower pole. No mass or hydronephrosis visualized. Left Kidney: Length: 9.2 cm. Moderate increased cortical echogenicity. No mass or hydronephrosis visualized. Bladder: Appears normal for degree of bladder distention. IMPRESSION: Normal size kidneys with increased cortical echogenicity which can be seen with medical renal disease. No hydronephrosis. 1.8 cm right renal cyst. Electronically Signed   By: Marin Olp M.D.   On: 02/08/2017 14:14     Assessment/Plan: 1.  AKI/CKD stage 4-5 in setting of decompensated CHF/cardiorenal syndrome.  She has had progressive CKD for several years and is now at stage V CKD with decompensated CHF.  Will plan for placement of HD cath and AVF/AVG by [facility] tomorrow and initiate HD after access has been placed.  She will also need to have outpatient HD arranged once stable for discharge. 2. Acute diastolic and systolic CHF- minimal response with IV Lasix 104m will increase dose and follow. 3. Anemic of CKD stage 5- stable but may need to initiate ESA if falls.  Will check iron stores 4. DM- per primary 5. HTN- stable 6. Obesity 7. Protein malnutrition- renal diet and may benefit from protein supplements 8. SHPTH- on calcitriol as well as phoslo for binder  9. Vascular access- have consulted [facility] for placement of HD cath and AVF/AVG   JDonetta Potts7/12/2016, 10:35 AM

## 2017-02-10 ENCOUNTER — Encounter (HOSPITAL_COMMUNITY)
Admission: EM | Disposition: A | Discharge status: Skilled Nursing Facility | Payer: Self-pay | Source: Home / Self Care | Attending: Internal Medicine

## 2017-02-10 ENCOUNTER — Inpatient Hospital Stay (HOSPITAL_COMMUNITY): Payer: Medicare Other | Admitting: Certified Registered Nurse Anesthetist

## 2017-02-10 ENCOUNTER — Encounter (HOSPITAL_COMMUNITY): Payer: Self-pay | Admitting: Certified Registered Nurse Anesthetist

## 2017-02-10 ENCOUNTER — Inpatient Hospital Stay (HOSPITAL_COMMUNITY): Payer: Medicare Other

## 2017-02-10 HISTORY — PX: AV FISTULA PLACEMENT: SHX1204

## 2017-02-10 HISTORY — PX: INSERTION OF DIALYSIS CATHETER: SHX1324

## 2017-02-10 LAB — RENAL FUNCTION PANEL
ALBUMIN: 2.9 g/dL — AB (ref 3.5–5.0)
ALBUMIN: 3 g/dL — AB (ref 3.5–5.0)
ANION GAP: 10 (ref 5–15)
Anion gap: 13 (ref 5–15)
BUN: 89 mg/dL — ABNORMAL HIGH (ref 6–20)
BUN: 88 mg/dL — AB (ref 6–20)
CALCIUM: 8.6 mg/dL — AB (ref 8.9–10.3)
CALCIUM: 8.5 mg/dL — AB (ref 8.9–10.3)
CO2: 27 mmol/L (ref 22–32)
CO2: 23 mmol/L (ref 22–32)
CREATININE: 5.05 mg/dL — AB (ref 0.44–1.00)
Chloride: 96 mmol/L — ABNORMAL LOW (ref 101–111)
Chloride: 98 mmol/L — ABNORMAL LOW (ref 101–111)
Creatinine, Ser: 5.11 mg/dL — ABNORMAL HIGH (ref 0.44–1.00)
GFR, EST AFRICAN AMERICAN: 9 mL/min — AB (ref >60)
GFR, EST AFRICAN AMERICAN: 9 mL/min — AB (ref >60)
GFR, EST NON AFRICAN AMERICAN: 8 mL/min — AB (ref >60)
GFR, EST NON AFRICAN AMERICAN: 8 mL/min — AB (ref >60)
Glucose, Bld: 144 mg/dL — ABNORMAL HIGH (ref 65–99)
Glucose, Bld: 145 mg/dL — ABNORMAL HIGH (ref 65–99)
PHOSPHORUS: 5.9 mg/dL — AB (ref 2.5–4.6)
POTASSIUM: 5.5 mmol/L — AB (ref 3.5–5.1)
Phosphorus: 6.6 mg/dL — ABNORMAL HIGH (ref 2.5–4.6)
Potassium: 5.6 mmol/L — ABNORMAL HIGH (ref 3.5–5.1)
SODIUM: 133 mmol/L — AB (ref 135–145)
SODIUM: 134 mmol/L — AB (ref 135–145)

## 2017-02-10 LAB — GLUCOSE, CAPILLARY
GLUCOSE-CAPILLARY: 117 mg/dL — AB (ref 65–99)
GLUCOSE-CAPILLARY: 164 mg/dL — AB (ref 65–99)
Glucose-Capillary: 127 mg/dL — ABNORMAL HIGH (ref 65–99)
Glucose-Capillary: 129 mg/dL — ABNORMAL HIGH (ref 65–99)
Glucose-Capillary: 136 mg/dL — ABNORMAL HIGH (ref 65–99)
Glucose-Capillary: 133 mg/dL — ABNORMAL HIGH (ref 65–99)

## 2017-02-10 LAB — CBC
HCT: 33.5 % — ABNORMAL LOW (ref 36.0–46.0)
HCT: 35.3 % — ABNORMAL LOW (ref 36.0–46.0)
HEMOGLOBIN: 10.2 g/dL — AB (ref 12.0–15.0)
Hemoglobin: 10.7 g/dL — ABNORMAL LOW (ref 12.0–15.0)
MCH: 26.4 pg (ref 26.0–34.0)
MCH: 26.6 pg (ref 26.0–34.0)
MCHC: 30.4 g/dL (ref 30.0–36.0)
MCHC: 30.3 g/dL (ref 30.0–36.0)
MCV: 86.8 fL (ref 78.0–100.0)
MCV: 87.8 fL (ref 78.0–100.0)
PLATELETS: 295 10*3/uL (ref 150–400)
Platelets: 291 10*3/uL (ref 150–400)
RBC: 3.86 MIL/uL — AB (ref 3.87–5.11)
RBC: 4.02 MIL/uL (ref 3.87–5.11)
RDW: 14.8 % (ref 11.5–15.5)
RDW: 14.8 % (ref 11.5–15.5)
WBC: 3.9 10*3/uL — AB (ref 4.0–10.5)
WBC: 5.4 10*3/uL (ref 4.0–10.5)

## 2017-02-10 LAB — SURGICAL PCR SCREEN
MRSA, PCR: NEGATIVE
STAPHYLOCOCCUS AUREUS: NEGATIVE

## 2017-02-10 SURGERY — ARTERIOVENOUS (AV) FISTULA CREATION
Anesthesia: General | Site: Neck | Laterality: Right

## 2017-02-10 MED ORDER — HEPARIN SODIUM (PORCINE) 1000 UNIT/ML IJ SOLN
INTRAMUSCULAR | Status: DC | PRN
  Administered 2017-02-10: 5000 [IU] via INTRAVENOUS

## 2017-02-10 MED ORDER — PENTAFLUOROPROP-TETRAFLUOROETH EX AERO: 1.0000 "application " | INHALATION_SPRAY | CUTANEOUS | Status: DC | PRN

## 2017-02-10 MED ORDER — SODIUM CHLORIDE 0.9 % IV SOLN: 100.0000 mL | INTRAVENOUS | Status: DC | PRN

## 2017-02-10 MED ORDER — HEPARIN SODIUM (PORCINE) 1000 UNIT/ML IJ SOLN
INTRAMUSCULAR | Status: AC
  Filled 2017-02-10: qty 1

## 2017-02-10 MED ORDER — CEFAZOLIN SODIUM-DEXTROSE 2-4 GM/100ML-% IV SOLN
2.0000 g | Freq: Once | INTRAVENOUS | Status: AC
  Administered 2017-02-10: 2 g via INTRAVENOUS

## 2017-02-10 MED ORDER — ONDANSETRON HCL 4 MG/2ML IJ SOLN
4.0000 mg | Freq: Four times a day (QID) | INTRAMUSCULAR | Status: DC | PRN
  Administered 2017-02-10 – 2017-02-20 (×7): 4 mg via INTRAVENOUS
  Filled 2017-02-10 (×6): qty 2

## 2017-02-10 MED ORDER — OXYCODONE-ACETAMINOPHEN 5-325 MG PO TABS
ORAL_TABLET | ORAL | Status: AC
  Administered 2017-02-10: 2 via ORAL
  Filled 2017-02-10: qty 2

## 2017-02-10 MED ORDER — 0.9 % SODIUM CHLORIDE (POUR BTL) OPTIME
TOPICAL | Status: DC | PRN
  Administered 2017-02-10: 1000 mL

## 2017-02-10 MED ORDER — ONDANSETRON HCL 4 MG/2ML IJ SOLN
INTRAMUSCULAR | Status: DC | PRN
  Administered 2017-02-10: 4 mg via INTRAVENOUS

## 2017-02-10 MED ORDER — ONDANSETRON HCL 4 MG/2ML IJ SOLN
INTRAMUSCULAR | Status: AC
  Filled 2017-02-10: qty 2

## 2017-02-10 MED ORDER — FENTANYL CITRATE (PF) 100 MCG/2ML IJ SOLN
INTRAMUSCULAR | Status: DC | PRN
  Administered 2017-02-10 (×2): 50 ug via INTRAVENOUS
  Administered 2017-02-10: 25 ug via INTRAVENOUS

## 2017-02-10 MED ORDER — EPHEDRINE SULFATE-NACL 50-0.9 MG/10ML-% IV SOSY
PREFILLED_SYRINGE | INTRAVENOUS | Status: DC | PRN
  Administered 2017-02-10 (×3): 10 mg via INTRAVENOUS

## 2017-02-10 MED ORDER — PROPOFOL 10 MG/ML IV BOLUS
INTRAVENOUS | Status: DC | PRN
  Administered 2017-02-10: 60 mg via INTRAVENOUS
  Administered 2017-02-10: 40 mg via INTRAVENOUS
  Administered 2017-02-10: 100 mg via INTRAVENOUS

## 2017-02-10 MED ORDER — PROTAMINE SULFATE 10 MG/ML IV SOLN
INTRAVENOUS | Status: DC | PRN
  Administered 2017-02-10: 50 mg via INTRAVENOUS

## 2017-02-10 MED ORDER — MORPHINE SULFATE (PF) 2 MG/ML IV SOLN: 1.0000 mg | INTRAVENOUS | Status: DC | PRN

## 2017-02-10 MED ORDER — EPHEDRINE 5 MG/ML INJ
INTRAVENOUS | Status: AC
  Filled 2017-02-10: qty 10

## 2017-02-10 MED ORDER — LIDOCAINE 2% (20 MG/ML) 5 ML SYRINGE
INTRAMUSCULAR | Status: DC | PRN
  Administered 2017-02-10: 50 mg via INTRAVENOUS

## 2017-02-10 MED ORDER — LIDOCAINE HCL (PF) 1 % IJ SOLN: 5.0000 mL | INTRAMUSCULAR | Status: DC | PRN

## 2017-02-10 MED ORDER — SODIUM CHLORIDE 0.9 % IV SOLN
INTRAVENOUS | Status: DC
  Administered 2017-02-10 – 2017-02-14 (×2): via INTRAVENOUS

## 2017-02-10 MED ORDER — CEFAZOLIN SODIUM-DEXTROSE 2-4 GM/100ML-% IV SOLN
INTRAVENOUS | Status: AC
  Filled 2017-02-10: qty 100

## 2017-02-10 MED ORDER — ONDANSETRON HCL 4 MG/2ML IJ SOLN: 4.0000 mg | Freq: Once | INTRAMUSCULAR | Status: DC | PRN

## 2017-02-10 MED ORDER — HEPARIN SODIUM (PORCINE) 1000 UNIT/ML IJ SOLN
INTRAMUSCULAR | Status: DC | PRN
  Administered 2017-02-10: 3400 [IU] via INTRA_ARTERIAL

## 2017-02-10 MED ORDER — ALTEPLASE 2 MG IJ SOLR: 2.0000 mg | Freq: Once | INTRAMUSCULAR | Status: DC | PRN

## 2017-02-10 MED ORDER — PROTAMINE SULFATE 10 MG/ML IV SOLN
INTRAVENOUS | Status: AC
  Filled 2017-02-10: qty 5

## 2017-02-10 MED ORDER — PHENYLEPHRINE HCL 10 MG/ML IJ SOLN
INTRAVENOUS | Status: DC | PRN
  Administered 2017-02-10: 25 ug/min via INTRAVENOUS
  Administered 2017-02-10: 50 ug/min via INTRAVENOUS

## 2017-02-10 MED ORDER — LIDOCAINE-PRILOCAINE 2.5-2.5 % EX CREA: 1.0000 "application " | TOPICAL_CREAM | CUTANEOUS | Status: DC | PRN

## 2017-02-10 MED ORDER — PROPOFOL 10 MG/ML IV BOLUS
INTRAVENOUS | Status: AC
  Filled 2017-02-10: qty 20

## 2017-02-10 MED ORDER — FENTANYL CITRATE (PF) 250 MCG/5ML IJ SOLN
INTRAMUSCULAR | Status: AC
  Filled 2017-02-10: qty 5

## 2017-02-10 MED ORDER — HEPARIN SODIUM (PORCINE) 1000 UNIT/ML DIALYSIS: 1000.0000 [IU] | INTRAMUSCULAR | Status: DC | PRN

## 2017-02-10 MED ORDER — HEPARIN SODIUM (PORCINE) 5000 UNIT/ML IJ SOLN
INTRAMUSCULAR | Status: DC | PRN
  Administered 2017-02-10: 14:00:00

## 2017-02-10 MED ORDER — OXYCODONE-ACETAMINOPHEN 5-325 MG PO TABS
1.0000 | ORAL_TABLET | ORAL | Status: DC | PRN
  Administered 2017-02-10: 2 via ORAL
  Administered 2017-02-11: 1 via ORAL
  Administered 2017-02-11 – 2017-02-19 (×9): 2 via ORAL
  Filled 2017-02-10: qty 2
  Filled 2017-02-10: qty 1
  Filled 2017-02-10 (×4): qty 2

## 2017-02-10 SURGICAL SUPPLY — 60 items
ADH SKN CLS APL DERMABOND .7 (GAUZE/BANDAGES/DRESSINGS) ×4
ARMBAND PINK RESTRICT EXTREMIT (MISCELLANEOUS) ×6 IMPLANT
BAG DECANTER FOR FLEXI CONT (MISCELLANEOUS) ×3 IMPLANT
BIOPATCH RED 1 DISK 7.0 (GAUZE/BANDAGES/DRESSINGS) ×3 IMPLANT
CANISTER SUCT 3000ML PPV (MISCELLANEOUS) ×3 IMPLANT
CANNULA VESSEL 3MM 2 BLNT TIP (CANNULA) ×3 IMPLANT
CATH PALINDROME RT-P 15FX19CM (CATHETERS) IMPLANT
CATH PALINDROME RT-P 15FX23CM (CATHETERS) IMPLANT
CATH PALINDROME RT-P 15FX28CM (CATHETERS) IMPLANT
CATH PALINDROME RT-P 15FX55CM (CATHETERS) IMPLANT
CATH STRAIGHT 5FR 65CM (CATHETERS) IMPLANT
CHLORAPREP W/TINT 26ML (MISCELLANEOUS) ×3 IMPLANT
COVER PROBE W GEL 5X96 (DRAPES) IMPLANT
COVER SURGICAL LIGHT HANDLE (MISCELLANEOUS) ×3 IMPLANT
DECANTER SPIKE VIAL GLASS SM (MISCELLANEOUS) ×3 IMPLANT
DERMABOND ADVANCED (GAUZE/BANDAGES/DRESSINGS) ×2
DERMABOND ADVANCED .7 DNX12 (GAUZE/BANDAGES/DRESSINGS) ×2 IMPLANT
DRAIN PENROSE 1/4X12 LTX STRL (WOUND CARE) ×4 IMPLANT
DRAPE C-ARM 42X72 X-RAY (DRAPES) ×3 IMPLANT
DRAPE CHEST BREAST 15X10 FENES (DRAPES) ×3 IMPLANT
ELECT REM PT RETURN 9FT ADLT (ELECTROSURGICAL) ×3
ELECTRODE REM PT RTRN 9FT ADLT (ELECTROSURGICAL) ×2 IMPLANT
GAUZE SPONGE 4X4 16PLY XRAY LF (GAUZE/BANDAGES/DRESSINGS) ×3 IMPLANT
GLOVE BIO SURGEON STRL SZ7.5 (GLOVE) ×4 IMPLANT
GLOVE BIOGEL PI IND STRL 7.0 (GLOVE) IMPLANT
GLOVE BIOGEL PI IND STRL 7.5 (GLOVE) IMPLANT
GLOVE BIOGEL PI INDICATOR 7.0 (GLOVE) ×1
GLOVE BIOGEL PI INDICATOR 7.5 (GLOVE) ×2
GLOVE ECLIPSE 7.0 STRL STRAW (GLOVE) ×1 IMPLANT
GLOVE SURG SS PI 6.5 STRL IVOR (GLOVE) ×1 IMPLANT
GOWN STRL REUS W/ TWL LRG LVL3 (GOWN DISPOSABLE) ×6 IMPLANT
GOWN STRL REUS W/TWL LRG LVL3 (GOWN DISPOSABLE) ×21
KIT BASIN OR (CUSTOM PROCEDURE TRAY) ×3 IMPLANT
KIT ROOM TURNOVER OR (KITS) ×3 IMPLANT
LOOP VESSEL MINI RED (MISCELLANEOUS) IMPLANT
NDL 18GX1X1/2 (RX/OR ONLY) (NEEDLE) ×2 IMPLANT
NDL HYPO 25GX1X1/2 BEV (NEEDLE) ×2 IMPLANT
NEEDLE 18GX1X1/2 (RX/OR ONLY) (NEEDLE) ×3 IMPLANT
NEEDLE HYPO 25GX1X1/2 BEV (NEEDLE) ×3 IMPLANT
NS IRRIG 1000ML POUR BTL (IV SOLUTION) ×3 IMPLANT
PACK CV ACCESS (CUSTOM PROCEDURE TRAY) ×3 IMPLANT
PACK SURGICAL SETUP 50X90 (CUSTOM PROCEDURE TRAY) ×3 IMPLANT
PAD ARMBOARD 7.5X6 YLW CONV (MISCELLANEOUS) ×6 IMPLANT
SET MICROPUNCTURE 5F STIFF (MISCELLANEOUS) IMPLANT
SPONGE SURGIFOAM ABS GEL 100 (HEMOSTASIS) IMPLANT
SUT ETHILON 3 0 PS 1 (SUTURE) ×3 IMPLANT
SUT PROLENE 7 0 BV 1 (SUTURE) ×4 IMPLANT
SUT SILK 0 FSL (SUTURE) IMPLANT
SUT VIC AB 3-0 SH 27 (SUTURE) ×3
SUT VIC AB 3-0 SH 27X BRD (SUTURE) ×2 IMPLANT
SUT VICRYL 4-0 PS2 18IN ABS (SUTURE) ×3 IMPLANT
SYR 10ML LL (SYRINGE) ×3 IMPLANT
SYR 20CC LL (SYRINGE) ×6 IMPLANT
SYR 5ML LL (SYRINGE) ×3 IMPLANT
SYR CONTROL 10ML LL (SYRINGE) ×3 IMPLANT
TOWEL OR 17X24 6PK STRL BLUE (TOWEL DISPOSABLE) ×3 IMPLANT
TOWEL OR 17X26 10 PK STRL BLUE (TOWEL DISPOSABLE) ×3 IMPLANT
UNDERPAD 30X30 (UNDERPADS AND DIAPERS) ×3 IMPLANT
WATER STERILE IRR 1000ML POUR (IV SOLUTION) ×3 IMPLANT
WIRE AMPLATZ SS-J .035X180CM (WIRE) IMPLANT

## 2017-02-10 NOTE — Progress Notes (Signed)
Report called to short stay [facility] at [facility]

## 2017-02-10 NOTE — Care Management Important Message (Signed)
Important Message  Patient Details  Name: Kathleen [name] MRN: [id number] Date of Birth: 22-Nov-1945   Medicare Important Message Given:  Yes    [name] 02/10/2017, 12:04 PM

## 2017-02-10 NOTE — Progress Notes (Signed)
Pt is alert and oriented in chair for half the night, Knee uncomfortable, tylenol given, MRSA swab sent, Vital Stable, Plan for HD cath today poss treatment.

## 2017-02-10 NOTE — Anesthesia Postprocedure Evaluation (Signed)
Anesthesia Post Note  Patient: KAITLAN BIN  Procedure(s) Performed: Procedure(s) (LRB): ARTERIOVENOUS (AV) FISTULA CREATION-LEFT FOREARM (Left) INSERTION OF DIALYSIS CATHETER (Right)     Patient location during evaluation: PACU Anesthesia Type: General Level of consciousness: awake and alert and oriented Pain management: pain level controlled Vital Signs Assessment: post-procedure vital signs reviewed and stable Respiratory status: spontaneous breathing, nonlabored ventilation and respiratory function stable Cardiovascular status: blood pressure returned to baseline and stable Postop Assessment: no signs of nausea or vomiting Anesthetic complications: no    Last Vitals:  Vitals:   02/10/17 1530 02/10/17 1533  BP:  (!) 148/59  Pulse: 69 70  Resp: 17 15  Temp:      Last Pain:  Vitals:   02/10/17 1530  TempSrc:   PainSc: 0-No pain                 Blaklee Shores A.

## 2017-02-10 NOTE — Progress Notes (Signed)
Dialysis treatment completed.  1500 mL ultrafiltrated.  1000 mL net fluid removal.  Patient status unchanged. Lung sounds diminished to ausculation in all fields. Generalized edema. Cardiac: NSr.  Cleansed RIJ catheter with chlorhexidine.  Disconnected lines and flushed ports with saline per protocol.  Ports locked with heparin and capped per protocol.    Report given to bedside, RN Arsenio Loader.

## 2017-02-10 NOTE — Care Management Important Message (Signed)
Important Message  Patient Details  Name: Kathleen [name] MRN: [id number] Date of Birth: 03-20-1946   Medicare Important Message Given:  Yes    [name] 02/10/2017, 12:04 PM

## 2017-02-10 NOTE — Progress Notes (Signed)
  Progress Note    02/10/2017 8:19 AM * No surgery date entered *  Subjective:  No complaints  Vitals:   02/09/17 1347 02/10/17 0534  BP:  (!) 158/58  Pulse: 68 71  Resp:    Temp:  98 F (36.7 C)    Physical Exam: aaox3 Non labored respirations 1+ palpable left radial pulse  CBC    Component Value Date/Time   WBC 3.9 (L) 02/10/2017 0352   RBC 3.86 (L) 02/10/2017 0352   HGB 10.2 (L) 02/10/2017 0352   HCT 33.5 (L) 02/10/2017 0352   PLT 291 02/10/2017 0352   MCV 86.8 02/10/2017 0352   MCH 26.4 02/10/2017 0352   MCHC 30.4 02/10/2017 0352   RDW 14.8 02/10/2017 0352   LYMPHSABS 0.8 08/10/2015 0234   MONOABS 0.6 08/10/2015 0234   EOSABS 0.3 08/10/2015 0234   BASOSABS 0.0 08/10/2015 0234    BMET    Component Value Date/Time   NA 133 (L) 02/10/2017 0352   K 5.5 (H) 02/10/2017 0352   CL 96 (L) 02/10/2017 0352   CO2 27 02/10/2017 0352   GLUCOSE 144 (H) 02/10/2017 0352   BUN 89 (H) 02/10/2017 0352   CREATININE 5.11 (H) 02/10/2017 0352   CALCIUM 8.6 (L) 02/10/2017 0352   GFRNONAA 8 (L) 02/10/2017 0352   GFRAA 9 (L) 02/10/2017 0352    INR    Component Value Date/Time   INR 1.36 12/18/2015 2247     Intake/Output Summary (Last 24 hours) at 02/10/17 0819 Last data filed at 02/10/17 0200  Gross per 24 hour  Intake              782 ml  Output              800 ml  Net              -18 ml     Assessment:  71 y.o. female is in need of hd access  Plan: OR today for tdc and left arm avf vs avg Discussed risks and benefits with patient and she agrees to proceed.    Handsome Anglin C. Donzetta Matters, MD [facility] [place] Office: [phone] Pager: [phone]  02/10/2017 8:19 AM

## 2017-02-10 NOTE — H&P (View-Only) (Signed)
  Progress Note    02/10/2017 8:19 AM * No surgery date entered *  Subjective:  No complaints  Vitals:   02/09/17 1347 02/10/17 0534  BP:  (!) 158/58  Pulse: 68 71  Resp:    Temp:  98 F (36.7 C)    Physical Exam: aaox3 Non labored respirations 1+ palpable left radial pulse  CBC    Component Value Date/Time   WBC 3.9 (L) 02/10/2017 0352   RBC 3.86 (L) 02/10/2017 0352   HGB 10.2 (L) 02/10/2017 0352   HCT 33.5 (L) 02/10/2017 0352   PLT 291 02/10/2017 0352   MCV 86.8 02/10/2017 0352   MCH 26.4 02/10/2017 0352   MCHC 30.4 02/10/2017 0352   RDW 14.8 02/10/2017 0352   LYMPHSABS 0.8 08/10/2015 0234   MONOABS 0.6 08/10/2015 0234   EOSABS 0.3 08/10/2015 0234   BASOSABS 0.0 08/10/2015 0234    BMET    Component Value Date/Time   NA 133 (L) 02/10/2017 0352   K 5.5 (H) 02/10/2017 0352   CL 96 (L) 02/10/2017 0352   CO2 27 02/10/2017 0352   GLUCOSE 144 (H) 02/10/2017 0352   BUN 89 (H) 02/10/2017 0352   CREATININE 5.11 (H) 02/10/2017 0352   CALCIUM 8.6 (L) 02/10/2017 0352   GFRNONAA 8 (L) 02/10/2017 0352   GFRAA 9 (L) 02/10/2017 0352    INR    Component Value Date/Time   INR 1.36 12/18/2015 2247     Intake/Output Summary (Last 24 hours) at 02/10/17 0819 Last data filed at 02/10/17 0200  Gross per 24 hour  Intake              782 ml  Output              800 ml  Net              -18 ml     Assessment:  71 y.o. female is in need of hd access  Plan: OR today for tdc and left arm avf vs avg Discussed risks and benefits with patient and she agrees to proceed.    Brandon C. Donzetta Matters, MD [facility] [place] Office: [phone] Pager: [phone]  02/10/2017 8:19 AM

## 2017-02-10 NOTE — Progress Notes (Signed)
MD returned page, gave verbal order for zofran   Nylia Gavina

## 2017-02-10 NOTE — Transfer of Care (Signed)
Immediate Anesthesia Transfer of Care Note  Patient: JOSELINNE LAWAL  Procedure(s) Performed: Procedure(s): ARTERIOVENOUS (AV) FISTULA CREATION-LEFT FOREARM (Left) INSERTION OF DIALYSIS CATHETER (Right)  Patient Location: PACU  Anesthesia Type:General  Level of Consciousness: awake, alert , oriented and patient cooperative  Airway & Oxygen Therapy: Patient Spontanous Breathing and Patient connected to nasal cannula oxygen  Post-op Assessment: Report given to RN, Post -op Vital signs reviewed and stable and Patient moving all extremities X 4  Post vital signs: Reviewed and stable  Last Vitals:  Vitals:   02/10/17 0534 02/10/17 1026  BP: (!) 158/58 135/77  Pulse: 71 67  Resp:  17  Temp: 36.7 C     Last Pain:  Vitals:   02/10/17 1105  TempSrc:   PainSc: 0-No pain         Complications: No apparent anesthesia complications

## 2017-02-10 NOTE — Progress Notes (Signed)
[facility] Progress Note    Assessment/ Plan:   1.  AKI/CKD stage 4-5 in setting of decompensated CHF/cardiorenal syndrome.  She has had progressive CKD for several years and is now at stage V CKD with decompensated CHF.  Will plan for placement of HD cath and AVF/AVG by [facility] today and initiate HD after access has been placed.  She will also need to have outpatient HD arranged once stable for discharge. - Appreciate [facility] placing access and catheter -> will initiate hd afterwards (2hr treatment 1st day and 3hr tomorrow) - CLIP 2. Acute diastolic and systolic CHF- minimal response with IV Lasix 60mg  will increase dose and follow. 3. Anemic of CKD stage 5- stable but may need to initiate ESA if falls.  Will check iron stores 4. DM- per primary 5. HTN- stable 6. Obesity 7. Protein malnutrition- renal diet and may benefit from protein supplements 8. SHPTH- on calcitriol as well as phoslo for binder    Subjective:   Still has dyspnea and orthopnea but improved from presentation. Denies f/c/n/v. Currently NPO for surgery today.   Objective:   BP (!) 158/58 (BP Location: Right Arm)   Pulse 71   Temp 98 F (36.7 C) (Oral)   Resp 17   Ht 5\' 3"  (1.6 m)   Wt 84.8 kg (186 lb 14.4 oz)   SpO2 96%   BMI 33.11 kg/m   Intake/Output Summary (Last 24 hours) at 02/10/17 0802 Last data filed at 02/10/17 0200  Gross per 24 hour  Intake              782 ml  Output              800 ml  Net              -18 ml   Weight change:   Physical Exam: General appearance: cooperative, fatigued and mild distress Head: Normocephalic, without obvious abnormality, atraumatic Resp: rales bibasilar - 1/4 up from base Cardio: regular rate and rhythm and no rub GI: soft, non-tender; bowel sounds normal; no masses,  no organomegaly GU: foley in place Extremities: has compression stockings on  Imaging: US Renal  Result Date: 02/08/2017 CLINICAL DATA:  Acute renal failure. EXAM: RENAL /  URINARY TRACT ULTRASOUND COMPLETE COMPARISON:  09/05/2013 and CT 12/22/2015 FINDINGS: Exam somewhat limited due to patient body habitus. Right Kidney: Length: 10.4 cm. Moderate increased cortical echogenicity with mild cortical thinning. 1.8 cm cyst over the lower pole. No mass or hydronephrosis visualized. Left Kidney: Length: 9.2 cm. Moderate increased cortical echogenicity. No mass or hydronephrosis visualized. Bladder: Appears normal for degree of bladder distention. IMPRESSION: Normal size kidneys with increased cortical echogenicity which can be seen with medical renal disease. No hydronephrosis. 1.8 cm right renal cyst. Electronically Signed   By: Marin Olp M.D.   On: 02/08/2017 14:14    Labs: BMET  Recent Labs Lab 02/07/17 0029 02/08/17 0331 02/09/17 0320 02/10/17 0352  NA 139 136 134* 133*  K 4.6 4.9 4.9 5.5*  CL 106 102 99* 96*  CO2 23 25 26 27   GLUCOSE 106* 171* 153* 144*  BUN 63* 71* 81* 89*  CREATININE 4.40* 4.54* 4.79* 5.11*  CALCIUM 8.5* 8.2* 8.5* 8.6*  PHOS  --  6.0* 5.7* 5.9*   CBC  Recent Labs Lab 02/07/17 0029 02/10/17 0352  WBC 5.6 3.9*  HGB 10.6* 10.2*  HCT 35.2* 33.5*  MCV 88.7 86.8  PLT 295 291    Medications:    .  aspirin EC  81 mg Oral Daily  . brimonidine  1 drop Right Eye BID   And  . timolol  1 drop Right Eye BID  . calcitRIOL  0.5 mcg Oral Daily  . calcium acetate  667 mg Oral TID WC  . carvedilol  12.5 mg Oral BID WC  . heparin  5,000 Units Subcutaneous Q8H  . hydrALAZINE  50 mg Oral TID  . insulin aspart  0-9 Units Subcutaneous TID WC  . insulin detemir  20 Units Subcutaneous QHS  . isosorbide mononitrate  30 mg Oral Daily      Otelia Santee, MD 02/10/2017, 8:02 AM

## 2017-02-10 NOTE — Progress Notes (Signed)
Called hemodialysis to inform that orders were released. RN aware, and stated pt will be pulled for a 2 hour session around 2000. Report given to HD RN.   Kathleen [name]

## 2017-02-10 NOTE — Progress Notes (Signed)
Paged MD regarding pt IVPB Lasix order 0800. Pt is NPO and going to OR for tunnel catheter today, there are also orders for pt to go to hemodialysis. Awaiting call back  Kathleen [name]

## 2017-02-10 NOTE — Op Note (Signed)
Procedure: Ultrasound-guided insertion of Palindrome catheter, right internal jugular vein, left brachial cephalic AV Fistula  Preoperative diagnosis: End-stage renal disease  Postoperative diagnosis: Same  Anesthesia: General  Asst: Gerri Lins, PA-C  Operative findings: 23 cm Diatek catheter right internal jugular vein  Operative details: After obtaining informed consent, the patient was taken to the operating room. The patient was placed in supine position on the operating room table. After adequate sedation the patient's entire neck and chest were prepped and draped in usual sterile fashion. The patient was placed in Trendelenburg position. Ultrasound was used to identify the patient's right internal jugular vein. This had normal compressibility and respiratory variation. Using ultrasound guidance, the right internal jugular vein was successfully cannulated.  A 0.035 J-tipped guidewire was threaded into the right internal jugular vein and into the superior vena cava followed by the inferior vena cava under fluoroscopic guidance.   Next sequential 12 and 14 dilators were placed over the guidewire into the right atrium.  A 16 French dilator with a peel-away sheath was then placed over the guidewire into the right atrium.   The guidewire and dilator were removed. A 23 cm Diatek catheter was then placed through the peel away sheath into the right atrium.  The catheter was then tunneled subcutaneously, cut to length, and the hub attached. The catheter was noted to flush and draw easily. The catheter was inspected under fluoroscopy and found with its tip to be in the right atrium without any kinks throughout its course. The catheter was sutured to the skin with nylon sutures. The neck insertion site was closed with Vicryl stitch. The catheter was then loaded with concentrated Heparin solution. A dry sterile dressing was applied.  Next, the left upper extremity was prepped and draped in usual sterile  fashion.  A transverse incision was then made near the antecubital crease the left arm. The incision was carried into the subcutaneous tissues down to level of the cephalic vein. The cephalic vein was approximately 3.5 mm in diameter. It was of good quality. This was dissected free circumferentially and small side branches ligated and divided between silk ties or clips. Next the brachial artery was dissected free in the medial portion of the incision. The artery was  3-4 mm in diameter. The vessel loops were placed proximal and distal to the planned site of arteriotomy. The patient was given 5000 units of intravenous heparin. After appropriate circulation time, the vessel loops were used to control the artery. A longitudinal opening was made in the brachial artery.  The vein was ligated distally with a 2-0 silk tie. The vein was controlled proximally with a fine bulldog clamp. The vein was then swung over to the artery and sewn end of vein to side of artery using a running 7-0 Prolene suture. Just prior to completion of the anastomosis, everything was fore bled back bled and thoroughly flushed. The anastomosis was secured, vessel loops released, and there was a palpable thrill in the fistula immediately. After hemostasis was obtained, the subcutaneous tissues were reapproximated using a running 3-0 Vicryl suture. The skin was then closed with a 4 Vicryl subcuticular stitch. Dermabond was applied to the skin incision.  The patient had a palpable radial pulse at the end of the case.  The patient tolerated procedure well and there were no complications. Instrument sponge and needle counts were correct at the end of the case. The patient was taken to the recovery room in stable condition. Chest x-ray will be obtained  in the recovery room.  [name], MD [facility] Office: [phone] Pager: [phone]

## 2017-02-10 NOTE — Progress Notes (Signed)
Patient arrived to unit by bed.  Reviewed treatment plan and this RN agrees with plan.  Report received from bedside RN, Cristin.  Consent obtained.  Patient A & O X 3.   Lung sounds diminished to ausculation in all fields. Generalized non pitting edema. Cardiac:  NSR.  Removed caps and cleansed RIJ catheter with chlorhedxidine.  Aspirated ports of heparin and flushed them with saline per protocol.  Connected and secured lines, initiated treatment at 2050.  UF Goal of 2000 mL and net fluid removal 1.5 L.  Will continue to monitor.

## 2017-02-10 NOTE — Progress Notes (Signed)
Pt returned from OR. Both sites clean dry and intact. Pt connected to frequent vital signs. Pt alert and oriented.  Tracy Kinner Leory Plowman

## 2017-02-10 NOTE — Progress Notes (Signed)
PROGRESS NOTE    Kathleen [name]  ZOX:[id number] DOB: [birth year] DOA: 02/06/2017 PCP: Benito Mccreedy, MD    Brief Narrative: 71 year old lady with prior h/o stage 4 CKD, systolic and diastolic CHF, dm, comes in for doe and LE edema.   Assessment & Plan:   Principal Problem:   Acute on chronic combined systolic and diastolic congestive heart failure (HCC) Active Problems:   Hypertensive urgency   CKD stage 4 due to type 2 diabetes mellitus (HCC)   CHF (congestive heart failure) (HCC)   Acute on chronic systolic and diastolic heart failure:  possibly from Non compliance to meds.  Repeat ECHO done ,showed LVEF of 45%, with diffuse hypokinesis, and grade 1 diastolic dysfunction.  Not much diuresis since admission, net fluid  is negative 1.7 liter. Daily weights. Strict intake and output. weaned her off the oxygen.  Was on lasix 80 mg BID, without much diuresis, changed to Lasix gtt by renal.  Plan for HD catheter today an AV fistula for HD later today and tomorrow.  Fluid management with HD    Acute on Stage 4 CKD:  ? Cardio renal syndrome.  Worsening renal parameters, nephrology consulted, plan for vein mapping, vascular surgery consult , placement of HD cath and possibly AVF today .   Hyperkalemia, hyponatremia, worsening renal parameters.  Needs HD.   Hypertensive urgency.  Improved .   Mild normocytic anemia: anemia of chronic disease.  Hemoglobin stable at 10., monitor.    Diabetes Mellitus:  CBG (last 3)   Recent Labs  02/10/17 0029 02/10/17 0753 02/10/17 1108  GLUCAP 164* 127* 129*    Resume SSI.  No change in meds.    Hyperlipidemia: not on statin.  CAD; No chest pain.  Resume aspirin.    Elevated troponins:  Possibly from demand ischemia from HF.  No chest pain.   Protein malnutrition:  Dietary consulted.    DVT prophylaxis: sq heparin.  Code Status: DNR.  Family Communication: NONE AT BEDSIDE.  Disposition Plan: pending further  eval.    Consultants:   Renal in am.   Vascular consult   Procedures: echocardiogram    Antimicrobials: none.    Subjective: Breathing the same No chest pain or sob.    Objective: Vitals:   02/09/17 1347 02/10/17 0534 02/10/17 0700 02/10/17 1026  BP:  (!) 158/58  135/77  Pulse: 68 71  67  Resp:    17  Temp:  98 F (36.7 C)    TempSrc:  Oral    SpO2: 95% 96%    Weight:   84.8 kg (186 lb 14.4 oz)   Height:        Intake/Output Summary (Last 24 hours) at 02/10/17 1217 Last data filed at 02/10/17 0946  Gross per 24 hour  Intake              782 ml  Output             1500 ml  Net             -718 ml   Filed Weights   02/08/17 0422 02/09/17 0735 02/10/17 0700  Weight: 85.6 kg (188 lb 12.8 oz) 85.6 kg (188 lb 12.8 oz) 84.8 kg (186 lb 14.4 oz)    Examination:  General exam: Appears comfortable NOT ON OXYGEN.  Respiratory system: Clear to auscultation. Respiratory effort normal. No wheezing or rhonchi.  Cardiovascular system: S1 & S2 heard, RRR. No JVD, murmurs Pedal edema present.  Gastrointestinal  system: Abdomen is soft non tender non distended bowel sounds heard.  Central nervous system: Alert and oriented. No focal neurological deficits. Extremities: 2+ leg edema.  Skin: No rashes, lesions or ulcers Psychiatry: Judgement and insight appear normal. Mood & affect appropriate.     Data Reviewed: I have personally reviewed following labs and imaging studies  CBC:  Recent Labs Lab 02/07/17 0029 02/10/17 0352  WBC 5.6 3.9*  HGB 10.6* 10.2*  HCT 35.2* 33.5*  MCV 88.7 86.8  PLT 295 426   Basic Metabolic Panel:  Recent Labs Lab 02/07/17 0029 02/08/17 0331 02/09/17 0320 02/10/17 0352  NA 139 136 134* 133*  K 4.6 4.9 4.9 5.5*  CL 106 102 99* 96*  CO2 23 25 26 27   GLUCOSE 106* 171* 153* 144*  BUN 63* 71* 81* 89*  CREATININE 4.40* 4.54* 4.79* 5.11*  CALCIUM 8.5* 8.2* 8.5* 8.6*  PHOS  --  6.0* 5.7* 5.9*   GFR: Estimated Creatinine Clearance:  10.3 mL/min (A) (by C-G formula based on SCr of 5.11 mg/dL (H)). Liver Function Tests:  Recent Labs Lab 02/08/17 0331 02/09/17 0320 02/10/17 0352  ALBUMIN 2.6* 2.9* 2.9*   No results for input(s): LIPASE, AMYLASE in the last 168 hours. No results for input(s): AMMONIA in the last 168 hours. Coagulation Profile: No results for input(s): INR, PROTIME in the last 168 hours. Cardiac Enzymes:  Recent Labs Lab 02/07/17 0705 02/07/17 1120 02/07/17 1600  TROPONINI 0.06* 0.06* 0.06*   BNP (last 3 results) No results for input(s): PROBNP in the last 8760 hours. HbA1C: No results for input(s): HGBA1C in the last 72 hours. CBG:  Recent Labs Lab 02/09/17 1638 02/09/17 2105 02/10/17 0029 02/10/17 0753 02/10/17 1108  GLUCAP 84 268* 164* 127* 129*   Lipid Profile: No results for input(s): CHOL, HDL, LDLCALC, TRIG, CHOLHDL, LDLDIRECT in the last 72 hours. Thyroid Function Tests: No results for input(s): TSH, T4TOTAL, FREET4, T3FREE, THYROIDAB in the last 72 hours. Anemia Panel: No results for input(s): VITAMINB12, FOLATE, FERRITIN, TIBC, IRON, RETICCTPCT in the last 72 hours. Sepsis Labs: No results for input(s): PROCALCITON, LATICACIDVEN in the last 168 hours.  Recent Results (from the past 240 hour(s))  Surgical PCR screen     Status: None   Collection Time: 02/10/17  5:28 AM  Result Value Ref Range Status   MRSA, PCR NEGATIVE NEGATIVE Final   Staphylococcus aureus NEGATIVE NEGATIVE Final    Comment:        The Xpert SA Assay (FDA approved for NASAL specimens in patients over 3 years of age), is one component of a comprehensive surveillance program.  Test performance has been validated by [facility] for patients greater than or equal to 15 year old. It is not intended to diagnose infection nor to guide or monitor treatment.          Radiology Studies: US Renal  Result Date: 02/08/2017 CLINICAL DATA:  Acute renal failure. EXAM: RENAL / URINARY TRACT  ULTRASOUND COMPLETE COMPARISON:  09/05/2013 and CT 12/22/2015 FINDINGS: Exam somewhat limited due to patient body habitus. Right Kidney: Length: 10.4 cm. Moderate increased cortical echogenicity with mild cortical thinning. 1.8 cm cyst over the lower pole. No mass or hydronephrosis visualized. Left Kidney: Length: 9.2 cm. Moderate increased cortical echogenicity. No mass or hydronephrosis visualized. Bladder: Appears normal for degree of bladder distention. IMPRESSION: Normal size kidneys with increased cortical echogenicity which can be seen with medical renal disease. No hydronephrosis. 1.8 cm right renal cyst. Electronically Signed  By: Marin Olp M.D.   On: 02/08/2017 14:14        Scheduled Meds: . [MAR Hold] aspirin EC  81 mg Oral Daily  . [MAR Hold] brimonidine  1 drop Right Eye BID   And  . [MAR Hold] timolol  1 drop Right Eye BID  . [MAR Hold] calcitRIOL  0.5 mcg Oral Daily  . [MAR Hold] calcium acetate  667 mg Oral TID WC  . [MAR Hold] carvedilol  12.5 mg Oral BID WC  . [MAR Hold] heparin  5,000 Units Subcutaneous Q8H  . [MAR Hold] hydrALAZINE  50 mg Oral TID  . [MAR Hold] insulin aspart  0-9 Units Subcutaneous TID WC  . [MAR Hold] insulin detemir  20 Units Subcutaneous QHS  . [MAR Hold] isosorbide mononitrate  30 mg Oral Daily   Continuous Infusions: . sodium chloride 10 mL/hr at 02/10/17 1145  . [MAR Hold]  ceFAZolin (ANCEF) IV    . [MAR Hold] furosemide Stopped (02/09/17 2213)     LOS: 3 days    Time spent: 35 minutes.     Hosie Poisson, MD [facility] Pager [phone]  If 7PM-7AM, please contact night-coverage [email or web address] Password [email or web address] 02/10/2017, 12:17 PM

## 2017-02-10 NOTE — Progress Notes (Signed)
Called pharmacy and re timed pt medications. Pt too nauseous to eat or take medications at this time   Edem Tiegs Leory Plowman

## 2017-02-10 NOTE — Progress Notes (Signed)
Paged MD regarding pt not having diet order   Evadne Ose Leory Plowman

## 2017-02-10 NOTE — Progress Notes (Signed)
Paged MD regarding pt feeling nauseous and order for zofran. Pt resting in bed, lights off  Kathleen [name]

## 2017-02-10 NOTE — Interval H&P Note (Signed)
History and Physical Interval Note:  02/10/2017 12:39 PM  Kathleen [name]  has presented today for surgery, with the diagnosis of End Stage Renal Disease N18.6  The various methods of treatment have been discussed with the patient and family. After consideration of risks, benefits and other options for treatment, the patient has consented to  Procedure(s): ARTERIOVENOUS (AV) FISTULA CREATION-LEFT ARM VERSUS INSERTION LEFT ARM ARTERIOVENOUS GORTEX GRAFT (Left) INSERTION OF DIALYSIS CATHETER (N/A) as a surgical intervention .  The patient's history has been reviewed, patient examined, no change in status, stable for surgery.  I have reviewed the patient's chart and labs.  Questions were answered to the patient's satisfaction.     [name]

## 2017-02-10 NOTE — Anesthesia Preprocedure Evaluation (Addendum)
Anesthesia Evaluation  Patient identified by MRN, date of birth, ID band Patient awake    Reviewed: Allergy & Precautions, NPO status , Patient's Chart, lab work & pertinent test results, reviewed documented beta blocker date and time   Airway Mallampati: II  TM Distance: >3 FB Neck ROM: Full    Dental no notable dental hx. (+) Teeth Intact   Pulmonary shortness of breath and with exertion, former smoker,    Pulmonary exam normal breath sounds clear to auscultation       Cardiovascular hypertension, Pt. on medications and Pt. on home beta blockers + CAD, + Peripheral Vascular Disease and +CHF  Normal cardiovascular exam Rhythm:Regular Rate:Normal     Neuro/Psych Meningioma negative psych ROS   GI/Hepatic   Endo/Other  diabetes, Well Controlled, Type 2, Insulin DependentObesity Hyperlipidemia  Renal/GU ESRF and DialysisRenal disease  negative genitourinary   Musculoskeletal   Abdominal (+) + obese,   Peds  Hematology  (+) anemia ,   Anesthesia Other Findings   Reproductive/Obstetrics                            Anesthesia Physical Anesthesia Plan  ASA: IV  Anesthesia Plan: General   Post-op Pain Management:    Induction: Intravenous  PONV Risk Score and Plan: 2 and Ondansetron and Propofol  Airway Management Planned: LMA  Additional Equipment:   Intra-op Plan:   Post-operative Plan: Extubation in OR  Informed Consent: I have reviewed the patients History and Physical, chart, labs and discussed the procedure including the risks, benefits and alternatives for the proposed anesthesia with the patient or authorized representative who has indicated his/her understanding and acceptance.   Dental advisory given  Plan Discussed with: CRNA, Anesthesiologist and Surgeon  Anesthesia Plan Comments:        Anesthesia Quick Evaluation

## 2017-02-10 NOTE — Progress Notes (Signed)
MD returned page stated to give IVPB lasix after pt returns from HD. Called pharmacy to re profile, pharmacist aware stated he will talk to MD  Rion Catala

## 2017-02-10 NOTE — Anesthesia Procedure Notes (Addendum)
Procedure Name: Intubation Date/Time: 02/10/2017 1:08 PM Performed by: Everlean Cherry A Pre-anesthesia Checklist: Patient identified, Emergency Drugs available, Suction available and Patient being monitored Patient Re-evaluated:Patient Re-evaluated prior to inductionOxygen Delivery Method: Circle system utilized Preoxygenation: Pre-oxygenation with 100% oxygen Intubation Type: IV induction Ventilation: Mask ventilation without difficulty and Oral airway inserted - appropriate to patient size Laryngoscope Size: Sabra Heck and 2 Grade View: Grade II Tube type: Oral Tube size: 7.5 mm Number of attempts: 1 Airway Equipment and Method: Stylet Placement Confirmation: positive ETCO2,  ETT inserted through vocal cords under direct vision and breath sounds checked- equal and bilateral Secured at: 21 cm Tube secured with: Tape Dental Injury: Teeth and Oropharynx as per pre-operative assessment  Comments: CRNA attempted to place LMA 4.  LMA would not seat appropriate.  Large leak and unable to obtain appropriate TVs.  Attempt x 2 to replace LMA 4 with same result.  Dr. Royce Macadamia then placed LMA 4 and unable to ventilate without large leak.  Decision made then to proceed with intubation.  DL x1 with Mil 2 and grade 2 view.  EBBS and VSS.  OG tube passed to decompress stomach.

## 2017-02-11 ENCOUNTER — Encounter (HOSPITAL_COMMUNITY): Payer: Self-pay | Admitting: Vascular Surgery

## 2017-02-11 LAB — RENAL FUNCTION PANEL
Albumin: 2.6 g/dL — ABNORMAL LOW (ref 3.5–5.0)
Anion gap: 8 (ref 5–15)
BUN: 54 mg/dL — AB (ref 6–20)
CHLORIDE: 97 mmol/L — AB (ref 101–111)
CO2: 29 mmol/L (ref 22–32)
Calcium: 8.5 mg/dL — ABNORMAL LOW (ref 8.9–10.3)
Creatinine, Ser: 3.96 mg/dL — ABNORMAL HIGH (ref 0.44–1.00)
GFR calc Af Amer: 12 mL/min — ABNORMAL LOW (ref >60)
GFR calc non Af Amer: 10 mL/min — ABNORMAL LOW (ref >60)
GLUCOSE: 81 mg/dL (ref 65–99)
POTASSIUM: 4.9 mmol/L (ref 3.5–5.1)
Phosphorus: 6.3 mg/dL — ABNORMAL HIGH (ref 2.5–4.6)
Sodium: 134 mmol/L — ABNORMAL LOW (ref 135–145)

## 2017-02-11 LAB — URINE CULTURE

## 2017-02-11 LAB — CBC
HEMATOCRIT: 31.8 % — AB (ref 36.0–46.0)
Hemoglobin: 9.4 g/dL — ABNORMAL LOW (ref 12.0–15.0)
MCH: 26.5 pg (ref 26.0–34.0)
MCHC: 29.6 g/dL — ABNORMAL LOW (ref 30.0–36.0)
MCV: 89.6 fL (ref 78.0–100.0)
Platelets: 272 10*3/uL (ref 150–400)
RBC: 3.55 MIL/uL — ABNORMAL LOW (ref 3.87–5.11)
RDW: 14.8 % (ref 11.5–15.5)
WBC: 4.1 10*3/uL (ref 4.0–10.5)

## 2017-02-11 LAB — HEPATITIS B SURFACE ANTIBODY,QUALITATIVE: Hep B S Ab: NONREACTIVE

## 2017-02-11 LAB — GLUCOSE, CAPILLARY
GLUCOSE-CAPILLARY: 124 mg/dL — AB (ref 65–99)
Glucose-Capillary: 90 mg/dL (ref 65–99)
Glucose-Capillary: 52 mg/dL — ABNORMAL LOW (ref 65–99)
Glucose-Capillary: 74 mg/dL (ref 65–99)

## 2017-02-11 LAB — HEPATITIS B CORE ANTIBODY, TOTAL: Hep B Core Total Ab: NEGATIVE

## 2017-02-11 LAB — HEPATITIS B SURFACE ANTIGEN: Hepatitis B Surface Ag: NEGATIVE

## 2017-02-11 MED ORDER — SODIUM CHLORIDE 0.9 % IV SOLN: 100.0000 mL | INTRAVENOUS | Status: DC | PRN

## 2017-02-11 MED ORDER — LIDOCAINE HCL (PF) 1 % IJ SOLN: 5.0000 mL | INTRAMUSCULAR | Status: DC | PRN

## 2017-02-11 MED ORDER — CEPHALEXIN 500 MG PO CAPS
500.0000 mg | ORAL_CAPSULE | Freq: Two times a day (BID) | ORAL | Status: DC
  Administered 2017-02-11 – 2017-02-19 (×16): 500 mg via ORAL
  Filled 2017-02-11 (×16): qty 1

## 2017-02-11 MED ORDER — OXYCODONE-ACETAMINOPHEN 5-325 MG PO TABS
ORAL_TABLET | ORAL | Status: AC
  Administered 2017-02-11: 2 via ORAL
  Filled 2017-02-11: qty 2

## 2017-02-11 MED ORDER — HEPARIN SODIUM (PORCINE) 1000 UNIT/ML DIALYSIS
1000.0000 [IU] | INTRAMUSCULAR | Status: DC | PRN
  Filled 2017-02-11: qty 1

## 2017-02-11 MED ORDER — PENTAFLUOROPROP-TETRAFLUOROETH EX AERO: 1.0000 "application " | INHALATION_SPRAY | CUTANEOUS | Status: DC | PRN

## 2017-02-11 MED ORDER — CALCITRIOL 0.5 MCG PO CAPS
ORAL_CAPSULE | ORAL | Status: AC
  Administered 2017-02-11: 0.5 ug via ORAL
  Filled 2017-02-11: qty 1

## 2017-02-11 MED ORDER — LIDOCAINE-PRILOCAINE 2.5-2.5 % EX CREA: 1.0000 "application " | TOPICAL_CREAM | CUTANEOUS | Status: DC | PRN

## 2017-02-11 MED ORDER — FUROSEMIDE 10 MG/ML IJ SOLN
120.0000 mg | Freq: Two times a day (BID) | INTRAVENOUS | Status: DC
  Administered 2017-02-11 – 2017-02-13 (×5): 120 mg via INTRAVENOUS
  Filled 2017-02-11 (×6): qty 12
  Filled 2017-02-11: qty 10

## 2017-02-11 MED ORDER — ALTEPLASE 2 MG IJ SOLR: 2.0000 mg | Freq: Once | INTRAMUSCULAR | Status: DC | PRN

## 2017-02-11 NOTE — Progress Notes (Signed)
Pharmacist Heart Failure Core Measure Documentation  Assessment: Kathleen [name] has an EF documented as 45% by ECHO 02/2017.  Rationale: Heart failure patients with left ventricular systolic dysfunction (LVSD) and an EF < 40% should be prescribed an angiotensin converting enzyme inhibitor (ACEI) or angiotensin receptor blocker (ARB) at discharge unless a contraindication is documented in the medical record.  This patient is not currently on an ACEI or ARB for HF.  This note is being placed in the record in order to provide documentation that a contraindication to the use of these agents is present for this encounter.  ACE Inhibitor or Angiotensin Receptor Blocker is contraindicated (specify all that apply)  []   ACEI allergy AND ARB allergy []   Angioedema []   Moderate or severe aortic stenosis []   Hyperkalemia []   Hypotension []   Renal artery stenosis [x]   Worsening renal function, preexisting renal disease or dysfunction   Bonnita Nasuti Pharm.D. CPP, BCPS Clinical Pharmacist [phone] 02/11/2017 7:21 AM

## 2017-02-11 NOTE — Progress Notes (Addendum)
[facility]  Subjective  - Doing OK over all, some nausea   Objective (!) 139/49 68 97.6 F (36.4 C) (Oral) 18 100%  Intake/Output Summary (Last 24 hours) at 02/11/17 0925 Last data filed at 02/11/17 0038  Gross per 24 hour  Intake           686.17 ml  Output             2020 ml  Net         -1333.83 ml    Left anti cubital incision healing well, palpable thrill in fistula at anastomosis. Hand with palpable radial pulse, grip 5/5  Assessment/Planning: POD # 1 Left UE fistula creation with Catheter placement F/u in our office in 6 weeks with Dr. Carvel Getting, EMMA [facility] 02/11/2017 9:25 AM -- Agree with above.  Will sign off  [name], MD [facility] [place]: [phone] Pager: [phone]  Laboratory Lab Results:  Recent Labs  02/10/17 0352 02/10/17 1819  WBC 3.9* 5.4  HGB 10.2* 10.7*  HCT 33.5* 35.3*  PLT 291 295   BMET  Recent Labs  02/10/17 0352 02/10/17 1819  NA 133* 134*  K 5.5* 5.6*  CL 96* 98*  CO2 27 23  GLUCOSE 144* 145*  BUN 89* 88*  CREATININE 5.11* 5.05*  CALCIUM 8.6* 8.5*    COAG Lab Results  Component Value Date   INR 1.36 12/18/2015   INR 1.25 06/10/2014   INR 1.11 01/26/2014   No results found for: PTT

## 2017-02-11 NOTE — Progress Notes (Signed)
Pt blood sugar low. Pt alert and oriented. Pt given orange juice and late lunch tray. Pt eating now. Will recheck in 15 min.  Kathleen [name]

## 2017-02-11 NOTE — Plan of Care (Signed)
Problem: Activity: Goal: Risk for activity intolerance will decrease Outcome: Progressing Leg Swelling and pain

## 2017-02-11 NOTE — Progress Notes (Signed)
MD called back and stated to give IVPB lasix   Kellin Fifer Leory Plowman

## 2017-02-11 NOTE — Progress Notes (Signed)
Paged MD regarding pt medications upon arrival from HD Awaiting call back   Dmarion Perfect Leory Plowman

## 2017-02-11 NOTE — Progress Notes (Signed)
PROGRESS NOTE    Kathleen [name]  UMP:[id number] DOB: 31-May-1946 DOA: 02/06/2017 PCP: Benito Mccreedy, MD    Brief Narrative: 71 year old lady with prior h/o stage 4 CKD, systolic and diastolic CHF, dm, comes in for doe and LE edema.   Assessment & Plan:   Principal Problem:   Acute on chronic combined systolic and diastolic congestive heart failure (HCC) Active Problems:   Hypertensive urgency   CKD stage 4 due to type 2 diabetes mellitus (HCC)   CHF (congestive heart failure) (HCC)   Acute on chronic systolic and diastolic heart failure:  possibly from Non compliance to meds.  Repeat ECHO done ,showed LVEF of 45%, with diffuse hypokinesis, and grade 1 diastolic dysfunction.  Daily weights. Strict intake and output..  Was on lasix 80 mg BID, without much diuresis, changed to Lasix gtt by renal.   underwent HD catheter today an AV fistula for HD . She underwent HD this am , and 1300 ml fluid taken out.  Fluid management with HD    Acute on Stage 4 CKD:  ? Cardio renal syndrome.  Worsening renal parameters, nephrology consulted,, vascular surgery consult ed ,  Underwent placement of HD cath and and AVF today.  1 st day of HD session today.  Pt reports being exhausted.   she will need outpatient HD set up .   Hyperkalemia, hyponatremia, worsening renal parameters.  Management with HD.   Hypertensive urgency.  Improved .   Mild normocytic anemia: anemia of chronic disease.  Hemoglobin dropped from 10 to 9, if hemoglobin continues to drop she will need aranesp.    Diabetes Mellitus:  CBG (last 3)   Recent Labs  02/10/17 2337 02/11/17 0801 02/11/17 1522  GLUCAP 117* 90 52*    Resume SSI.  No change in meds.    Hyperlipidemia: not on statin.  CAD; No chest pain.  Resume aspirin.    Elevated troponins:  Possibly from demand ischemia from HF.  No chest pain.   Protein malnutrition:  Dietary consulted.   UTI:  Start keflex 500 mg bid.    DVT  prophylaxis: sq heparin.  Code Status: DNR.  Family Communication: NONE AT BEDSIDE.  Disposition Plan: pending further eval.    Consultants:   Renal in am.   Vascular consult   Procedures: echocardiogram    Antimicrobials: none.    Subjective: Breathing the same No chest pain or sob.  No cough.    Objective: Vitals:   02/11/17 1400 02/11/17 1410 02/11/17 1412 02/11/17 1513  BP: (!) 142/65 (!) 120/58 (!) 165/70 (!) 125/48  Pulse: 72 70 75 72  Resp:  11  16  Temp:  97.8 F (36.6 C)    TempSrc:      SpO2:      Weight:  83.5 kg (184 lb 1.4 oz)    Height:        Intake/Output Summary (Last 24 hours) at 02/11/17 1558 Last data filed at 02/11/17 1412  Gross per 24 hour  Intake           286.17 ml  Output             1800 ml  Net         -1513.83 ml   Filed Weights   02/11/17 0500 02/11/17 1104 02/11/17 1410  Weight: 84 kg (185 lb 3.2 oz) 84 kg (185 lb 3 oz) 83.5 kg (184 lb 1.4 oz)    Examination:  General exam: alert and  comfortable.  Respiratory system: Clear to auscultation. Respiratory effort normal. No wheezing or rhonchi.  Cardiovascular system: S1 & S2 heard, RRR. No JVD, murmurs, improving pedal edema.  Gastrointestinal system: Abdomen is soft NT ND bs+ Central nervous system: Alert and oriented. No focal neurological deficits. Extremities: 2+ leg edema.  Skin: No rashes, lesions or ulcers Psychiatry: Judgement and insight appear normal. Mood & affect appropriate.     Data Reviewed: I have personally reviewed following labs and imaging studies  CBC:  Recent Labs Lab 02/07/17 0029 02/10/17 0352 02/10/17 1819 02/11/17 1100  WBC 5.6 3.9* 5.4 4.1  HGB 10.6* 10.2* 10.7* 9.4*  HCT 35.2* 33.5* 35.3* 31.8*  MCV 88.7 86.8 87.8 89.6  PLT 295 291 295 263   Basic Metabolic Panel:  Recent Labs Lab 02/08/17 0331 02/09/17 0320 02/10/17 0352 02/10/17 1819 02/11/17 1100  NA 136 134* 133* 134* 134*  K 4.9 4.9 5.5* 5.6* 4.9  CL 102 99* 96* 98*  97*  CO2 25 26 27 23 29   GLUCOSE 171* 153* 144* 145* 81  BUN 71* 81* 89* 88* 54*  CREATININE 4.54* 4.79* 5.11* 5.05* 3.96*  CALCIUM 8.2* 8.5* 8.6* 8.5* 8.5*  PHOS 6.0* 5.7* 5.9* 6.6* 6.3*   GFR: Estimated Creatinine Clearance: 13.1 mL/min (A) (by C-G formula based on SCr of 3.96 mg/dL (H)). Liver Function Tests:  Recent Labs Lab 02/08/17 0331 02/09/17 0320 02/10/17 0352 02/10/17 1819 02/11/17 1100  ALBUMIN 2.6* 2.9* 2.9* 3.0* 2.6*   No results for input(s): LIPASE, AMYLASE in the last 168 hours. No results for input(s): AMMONIA in the last 168 hours. Coagulation Profile: No results for input(s): INR, PROTIME in the last 168 hours. Cardiac Enzymes:  Recent Labs Lab 02/07/17 0705 02/07/17 1120 02/07/17 1600  TROPONINI 0.06* 0.06* 0.06*   BNP (last 3 results) No results for input(s): PROBNP in the last 8760 hours. HbA1C: No results for input(s): HGBA1C in the last 72 hours. CBG:  Recent Labs Lab 02/10/17 1508 02/10/17 1632 02/10/17 2337 02/11/17 0801 02/11/17 1522  GLUCAP 136* 133* 117* 90 52*   Lipid Profile: No results for input(s): CHOL, HDL, LDLCALC, TRIG, CHOLHDL, LDLDIRECT in the last 72 hours. Thyroid Function Tests: No results for input(s): TSH, T4TOTAL, FREET4, T3FREE, THYROIDAB in the last 72 hours. Anemia Panel: No results for input(s): VITAMINB12, FOLATE, FERRITIN, TIBC, IRON, RETICCTPCT in the last 72 hours. Sepsis Labs: No results for input(s): PROCALCITON, LATICACIDVEN in the last 168 hours.  Recent Results (from the past 240 hour(s))  Culture, Urine     Status: Abnormal   Collection Time: 02/08/17  1:51 PM  Result Value Ref Range Status   Specimen Description URINE, RANDOM  Final   Special Requests NONE  Final   Culture >=100,000 COLONIES/mL ESCHERICHIA COLI (A)  Final   Report Status 02/11/2017 FINAL  Final   Organism ID, Bacteria ESCHERICHIA COLI (A)  Final      Susceptibility   Escherichia coli - MIC*    AMPICILLIN 16 INTERMEDIATE  Intermediate     CEFAZOLIN <=4 SENSITIVE Sensitive     CEFTRIAXONE <=1 SENSITIVE Sensitive     CIPROFLOXACIN <=0.25 SENSITIVE Sensitive     GENTAMICIN <=1 SENSITIVE Sensitive     IMIPENEM <=0.25 SENSITIVE Sensitive     NITROFURANTOIN <=16 SENSITIVE Sensitive     TRIMETH/SULFA <=20 SENSITIVE Sensitive     AMPICILLIN/SULBACTAM <=2 SENSITIVE Sensitive     PIP/TAZO <=4 SENSITIVE Sensitive     Extended ESBL NEGATIVE Sensitive     * >=  100,000 COLONIES/mL ESCHERICHIA COLI  Surgical PCR screen     Status: None   Collection Time: 02/10/17  5:28 AM  Result Value Ref Range Status   MRSA, PCR NEGATIVE NEGATIVE Final   Staphylococcus aureus NEGATIVE NEGATIVE Final    Comment:        The Xpert SA Assay (FDA approved for NASAL specimens in patients over 31 years of age), is one component of a comprehensive surveillance program.  Test performance has been validated by [facility] for patients greater than or equal to 61 year old. It is not intended to diagnose infection nor to guide or monitor treatment.          Radiology Studies: Dg Chest Port 1 View  Result Date: 02/10/2017 CLINICAL DATA:  Central line placement. EXAM: PORTABLE CHEST 1 VIEW COMPARISON:  02/06/2017 FINDINGS: Large bore right internal jugular approach central venous catheter terminates in the expected location of the right atrium. The cardiac silhouette is enlarged. Mediastinal contours appear intact. There is no evidence of focal airspace consolidation, pleural effusion or pneumothorax. Interstitial pulmonary edema. Osseous structures are without acute abnormality. Soft tissues are grossly normal. IMPRESSION: Right internal jugular approach central venous catheter terminates in the expected location of the right atrium. Enlarged cardiac silhouette with interstitial pulmonary edema. These results will be called to the ordering clinician or representative by the Radiologist Assistant, and communication documented in the PACS or  zVision Dashboard. Electronically Signed   By: Fidela Salisbury M.D.   On: 02/10/2017 15:26   Dg Fluoro Guide Cv Line-no Report  Result Date: 02/10/2017 Fluoroscopy was utilized by the requesting physician.  No radiographic interpretation.        Scheduled Meds: . aspirin EC  81 mg Oral Daily  . brimonidine  1 drop Right Eye BID   And  . timolol  1 drop Right Eye BID  . calcitRIOL  0.5 mcg Oral Daily  . calcium acetate  667 mg Oral TID WC  . carvedilol  12.5 mg Oral BID WC  . heparin  5,000 Units Subcutaneous Q8H  . hydrALAZINE  50 mg Oral TID  . insulin aspart  0-9 Units Subcutaneous TID WC  . insulin detemir  20 Units Subcutaneous QHS  . isosorbide mononitrate  30 mg Oral Daily   Continuous Infusions: . sodium chloride 10 mL/hr at 02/10/17 1145  . furosemide Stopped (02/11/17 0038)     LOS: 4 days    Time spent: 35 minutes.     Hosie Poisson, MD [facility] Pager [phone]  If 7PM-7AM, please contact night-coverage [email or web address] Password [email or web address] 02/11/2017, 3:58 PM

## 2017-02-11 NOTE — Progress Notes (Signed)
Pt states she does not want to eat or have juice prior to dialysis transport. Pt resting in recliner. Will continue to monitor  Shalisha Clausing Leory Plowman

## 2017-02-11 NOTE — Progress Notes (Signed)
Called pharmacy to reschedule pt IVPB lasix. Pt dose finished at 1730. Next dose ordered for 2000. Pharmacy stated they will re profile for q 12 hours.   Kathleen [name]

## 2017-02-11 NOTE — Progress Notes (Signed)
[facility] Progress Note    Assessment/ Plan:   1. AKI/CKD stage 4-5 in setting of decompensated CHF/cardiorenal syndrome. She has had progressive CKD for several years and is now at stage V CKD with decompensated CHF. Will plan for placement of HD cath and AVF/AVG by [facility] today and initiate HD after access has been placed. She will also need to have outpatient HD arranged once stable for discharge. - Appreciate [facility] placing access and catheter -> will initiate hd afterwards (2hr treatment 1st day and 3hr tomorrow) - CLIP 2. Acute diastolic and systolic CHF- minimal response with IV Lasix 60mg  will increase dose and follow. 3. Anemic of CKD stage 5- stable but may need to initiate ESA if falls. Will check iron stores 4. DM- per primary 5. HTN- stable 6. Obesity 7. Protein malnutrition- renal diet and may benefit from protein supplements 8. SHPTH- on calcitriol as well as phoslo for binder  Subjective:   Has nausea this morning. Denies f/c. Dyspnea about the same.  Pt seen on dialysis ~210PM 146/65 300/500 1.3L 2K bath BP a little soft w/ associated abd pain during diff times in the treatment (will monitor -> could this be a sign of ischemic colitis?)   Objective:   BP (!) 139/49 (BP Location: Right Arm)   Pulse 68   Temp 97.6 F (36.4 C) (Oral)   Resp 18   Ht 5\' 3"  (1.6 m)   Wt 84 kg (185 lb 3.2 oz)   SpO2 100%   BMI 32.81 kg/m   Intake/Output Summary (Last 24 hours) at 02/11/17 0844 Last data filed at 02/11/17 0038  Gross per 24 hour  Intake           686.17 ml  Output             2020 ml  Net         -1333.83 ml   Weight change: -0.639 kg (-1 lb 6.5 oz)  Physical Exam: General appearance: cooperative, fatigued and mild distress Head: Normocephalic, without obvious abnormality, atraumatic Resp: rales bibasilar - 1/4 up from base Cardio: regular rate and rhythm and no rub GI: soft, non-tender; bowel sounds normal; no masses, no  organomegaly Extremities: has compression stockings on Access: lt BCF w/ good bruit, RIJ cath  Imaging: Dg Chest Port 1 View  Result Date: 02/10/2017 CLINICAL DATA:  Central line placement. EXAM: PORTABLE CHEST 1 VIEW COMPARISON:  02/06/2017 FINDINGS: Large bore right internal jugular approach central venous catheter terminates in the expected location of the right atrium. The cardiac silhouette is enlarged. Mediastinal contours appear intact. There is no evidence of focal airspace consolidation, pleural effusion or pneumothorax. Interstitial pulmonary edema. Osseous structures are without acute abnormality. Soft tissues are grossly normal. IMPRESSION: Right internal jugular approach central venous catheter terminates in the expected location of the right atrium. Enlarged cardiac silhouette with interstitial pulmonary edema. These results will be called to the ordering clinician or representative by the Radiologist Assistant, and communication documented in the PACS or zVision Dashboard. Electronically Signed   By: Fidela Salisbury M.D.   On: 02/10/2017 15:26   Dg Fluoro Guide Cv Line-no Report  Result Date: 02/10/2017 Fluoroscopy was utilized by the requesting physician.  No radiographic interpretation.    Labs: BMET  Recent Labs Lab 02/07/17 0029 02/08/17 0331 02/09/17 0320 02/10/17 0352 02/10/17 1819  NA 139 136 134* 133* 134*  K 4.6 4.9 4.9 5.5* 5.6*  CL 106 102 99* 96* 98*  CO2 23 25  26 27 23   GLUCOSE 106* 171* 153* 144* 145*  BUN 63* 71* 81* 89* 88*  CREATININE 4.40* 4.54* 4.79* 5.11* 5.05*  CALCIUM 8.5* 8.2* 8.5* 8.6* 8.5*  PHOS  --  6.0* 5.7* 5.9* 6.6*   CBC  Recent Labs Lab 02/07/17 0029 02/10/17 0352 02/10/17 1819  WBC 5.6 3.9* 5.4  HGB 10.6* 10.2* 10.7*  HCT 35.2* 33.5* 35.3*  MCV 88.7 86.8 87.8  PLT 295 291 295    Medications:    . aspirin EC  81 mg Oral Daily  . brimonidine  1 drop Right Eye BID   And  . timolol  1 drop Right Eye BID  . calcitRIOL   0.5 mcg Oral Daily  . calcium acetate  667 mg Oral TID WC  . carvedilol  12.5 mg Oral BID WC  . heparin  5,000 Units Subcutaneous Q8H  . hydrALAZINE  50 mg Oral TID  . insulin aspart  0-9 Units Subcutaneous TID WC  . insulin detemir  20 Units Subcutaneous QHS  . isosorbide mononitrate  30 mg Oral Daily      Otelia Santee, MD 02/11/2017, 8:44 AM

## 2017-02-11 NOTE — Progress Notes (Signed)
Called pharmacy for the third time regarding pt missing IVPB lasix dose. Awaiting dose   Kathleen [name] Leory Plowman

## 2017-02-11 NOTE — Progress Notes (Signed)
Dialysis treatment completed.  1300 mL ultrafiltrated.  500 mL net fluid removal.  Patient status unchanged. Lung sounds diminished to ausculation in all fields. Generalized edema. Cardiac: NSR.  Cleansed RIJ catheter with chlorhexidine.  Disconnected lines and flushed ports with saline per protocol.  Ports locked with heparin and capped per protocol.    Report given to bedside, RN Cristin.

## 2017-02-11 NOTE — Progress Notes (Signed)
Called Hemodialysis, talked to [facility]. Report given, she stated she will be taking the pt down within an hour and to not give medications. HD RN aware of pt blood sugar of 90.   Kathleen [name]

## 2017-02-11 NOTE — Progress Notes (Signed)
Patient arrived to unit by bed.  Reviewed treatment plan and this RN agrees with plan.  Report received from bedside RN, Cristen.  Consent verified.  Patient A & O X 4.   Lung sounds diminished to ausculation in all fields. BLE edema. Cardiac:  NSR.  Removed caps and cleansed RIJ catheter with chlorhedxidine.  Aspirated ports of heparin and flushed them with saline per protocol.  Connected and secured lines, initiated treatment at 1110.  UF Goal of 2500 mL and net fluid removal 2 L.  Will continue to monitor.

## 2017-02-12 LAB — GLUCOSE, CAPILLARY
GLUCOSE-CAPILLARY: 78 mg/dL (ref 65–99)
GLUCOSE-CAPILLARY: 191 mg/dL — AB (ref 65–99)
GLUCOSE-CAPILLARY: 130 mg/dL — AB (ref 65–99)
Glucose-Capillary: 171 mg/dL — ABNORMAL HIGH (ref 65–99)

## 2017-02-12 MED ORDER — MECLIZINE HCL 25 MG PO TABS: 12.5000 mg | ORAL_TABLET | Freq: Three times a day (TID) | ORAL | Status: DC | PRN

## 2017-02-12 MED ORDER — INSULIN DETEMIR 100 UNIT/ML ~~LOC~~ SOLN
15.0000 [IU] | Freq: Every day | SUBCUTANEOUS | Status: DC
  Administered 2017-02-12 – 2017-02-19 (×8): 15 [IU] via SUBCUTANEOUS
  Filled 2017-02-12 (×9): qty 0.15

## 2017-02-12 NOTE — Progress Notes (Signed)
Patient with small dry area to right shin area foam covering, back of left leg with 5x3 cm open area that patient says was formerly a blister.  Removed foam and replaced with vaseline gauze, telfa and gauze.  Requested WOC consult from MD.

## 2017-02-12 NOTE — Progress Notes (Signed)
Patient without complaint 3-7 p.m., got up to chair with 2 assist and visited with family majority of shift.

## 2017-02-12 NOTE — Progress Notes (Signed)
PROGRESS NOTE    Kathleen [name]  WRU:[id number] DOB: [birth year] DOA: 02/06/2017 PCP: Benito Mccreedy, MD    Brief Narrative: 71 year old lady with prior h/o stage 4 to 5  CKD, systolic and diastolic CHF, dm, hypertension, hyperlipidemia, CAD, anemia of chronic disease, diabetes Mellitus,  comes in for doe and LE edema. She was initially started on IV lasix, without much diuresis, instead her creatinine started worsening. Nephrology consulted, recommended initiating HD and vascular surgery consulted vein mapping, placement of HD catheter and AVF/ AVG.   Assessment & Plan:   Principal Problem:   Acute on chronic combined systolic and diastolic congestive heart failure (HCC) Active Problems:   Hypertensive urgency   CKD stage 4 due to type 2 diabetes mellitus (HCC)   CHF (congestive heart failure) (HCC)   Acute on chronic systolic and diastolic heart failure: Possibly from non compliant to meds and cardio renal syndrome.  Repeat ECHO done ,showed LVEF of 45%, with diffuse hypokinesis, and grade 1 diastolic dysfunction.  Was on lasix 80 mg BID, without much diuresis, changed to Lasix 120 mg BID.  Nephrology consulted and recommended initiating HD.  Vascular surgery consulted on 7/5 , underwent vein mapping.   She had right IJ  HD catheter placed in addition to a left brachial cephalic AV fistula for HD on 7/6.  Vascular recommended follow up with with Dr Oneida Alar in 6 weeks in the office.  HD initiated on 7/6, 7/7.    Acute on Stage 4 CKD:  ? Cardio renal syndrome.  Worsening renal parameters on IV lasix, nephrology consulted,,  Underwent placement of HD cath and left UE AV fistula.  HD started and her breathing has improved.  She will need outpatient HD set up .   Hyperkalemia, hyponatremia, worsening renal parameters.  Management with HD.   Hypertensive urgency.  Improved .   Mild normocytic anemia: anemia of chronic disease.  Hemoglobin dropped from 10 to 9, if hemoglobin  continues to drop she will need aranesp.    Diabetes Mellitus:  CBG (last 3)   Recent Labs  02/11/17 1558 02/11/17 2038 02/12/17 0713  GLUCAP 74 124* 78    Her CBG'S running low, decreased the dose of the long acting insulin to 15 units of levemir daily from 20 units.  Resume SSI.   Protein energy malnutrition Nutrition:  Renal diet and supplementation.    Hyperlipidemia: not on statin.  CAD; No chest pain.  Resume aspirin coreg.    Elevated troponins:  Possibly from demand ischemia from HF.  No chest pain.    E coli UTI:  Start keflex 500 mg bid.    DVT prophylaxis: sq heparin.  Code Status: DNR.  Family Communication: NONE AT BEDSIDE.  Disposition Plan: home when outpatient HD is set up.    Consultants:   Nephrology consult.   Vascular consult   Procedures:   Echocardiogram  Vein mapping  Right IJ  HD catheter placed in addition to a left brachial cephalic AV fistula for HD on 7/6.    Antimicrobials: keflex for UTI.    Subjective:  laying in bed, still on 2 lit of [place] oxygen. Denies any complaints.    Objective: Vitals:   02/11/17 1412 02/11/17 1513 02/11/17 2057 02/12/17 0444  BP: (!) 165/70 (!) 125/48 (!) 118/44 99/86  Pulse: 75 72 67 79  Resp:  16 14 15   Temp:   98.2 F (36.8 C) 98.2 F (36.8 C)  TempSrc:   Oral Oral  SpO2:   100% 100%  Weight:    82.6 kg (182 lb 1.6 oz)  Height:        Intake/Output Summary (Last 24 hours) at 02/12/17 0815 Last data filed at 02/12/17 0755  Gross per 24 hour  Intake              354 ml  Output              625 ml  Net             -271 ml   Filed Weights   02/11/17 1104 02/11/17 1410 02/12/17 0444  Weight: 84 kg (185 lb 3 oz) 83.5 kg (184 lb 1.4 oz) 82.6 kg (182 lb 1.6 oz)    Examination:  General exam: alert and comfortable. On 2 lit of [place] oxygen.  Respiratory system: clear no wheezing or rhonchi.  Cardiovascular system: S1 & S2 heard, RRR. No JVD, murmurs, improving pedal edema.    Gastrointestinal system: Abdomen is soft non tender non distended bowel sounds heard.  Central nervous system: Alert and oriented. No focal neurological deficits. Extremities: leg edema improved.  Skin: No rashes, lesions or ulcers Psychiatry: Mood & affect appropriate.     Data Reviewed: I have personally reviewed following labs and imaging studies  CBC:  Recent Labs Lab 02/07/17 0029 02/10/17 0352 02/10/17 1819 02/11/17 1100  WBC 5.6 3.9* 5.4 4.1  HGB 10.6* 10.2* 10.7* 9.4*  HCT 35.2* 33.5* 35.3* 31.8*  MCV 88.7 86.8 87.8 89.6  PLT 295 291 295 678   Basic Metabolic Panel:  Recent Labs Lab 02/08/17 0331 02/09/17 0320 02/10/17 0352 02/10/17 1819 02/11/17 1100  NA 136 134* 133* 134* 134*  K 4.9 4.9 5.5* 5.6* 4.9  CL 102 99* 96* 98* 97*  CO2 25 26 27 23 29   GLUCOSE 171* 153* 144* 145* 81  BUN 71* 81* 89* 88* 54*  CREATININE 4.54* 4.79* 5.11* 5.05* 3.96*  CALCIUM 8.2* 8.5* 8.6* 8.5* 8.5*  PHOS 6.0* 5.7* 5.9* 6.6* 6.3*   GFR: Estimated Creatinine Clearance: 13.1 mL/min (A) (by C-G formula based on SCr of 3.96 mg/dL (H)). Liver Function Tests:  Recent Labs Lab 02/08/17 0331 02/09/17 0320 02/10/17 0352 02/10/17 1819 02/11/17 1100  ALBUMIN 2.6* 2.9* 2.9* 3.0* 2.6*   No results for input(s): LIPASE, AMYLASE in the last 168 hours. No results for input(s): AMMONIA in the last 168 hours. Coagulation Profile: No results for input(s): INR, PROTIME in the last 168 hours. Cardiac Enzymes:  Recent Labs Lab 02/07/17 0705 02/07/17 1120 02/07/17 1600  TROPONINI 0.06* 0.06* 0.06*   BNP (last 3 results) No results for input(s): PROBNP in the last 8760 hours. HbA1C: No results for input(s): HGBA1C in the last 72 hours. CBG:  Recent Labs Lab 02/11/17 0801 02/11/17 1522 02/11/17 1558 02/11/17 2038 02/12/17 0713  GLUCAP 90 52* 74 124* 78   Lipid Profile: No results for input(s): CHOL, HDL, LDLCALC, TRIG, CHOLHDL, LDLDIRECT in the last 72 hours. Thyroid  Function Tests: No results for input(s): TSH, T4TOTAL, FREET4, T3FREE, THYROIDAB in the last 72 hours. Anemia Panel: No results for input(s): VITAMINB12, FOLATE, FERRITIN, TIBC, IRON, RETICCTPCT in the last 72 hours. Sepsis Labs: No results for input(s): PROCALCITON, LATICACIDVEN in the last 168 hours.  Recent Results (from the past 240 hour(s))  Culture, Urine     Status: Abnormal   Collection Time: 02/08/17  1:51 PM  Result Value Ref Range Status   Specimen Description URINE, RANDOM  Final  Special Requests NONE  Final   Culture >=100,000 COLONIES/mL ESCHERICHIA COLI (A)  Final   Report Status 02/11/2017 FINAL  Final   Organism ID, Bacteria ESCHERICHIA COLI (A)  Final      Susceptibility   Escherichia coli - MIC*    AMPICILLIN 16 INTERMEDIATE Intermediate     CEFAZOLIN <=4 SENSITIVE Sensitive     CEFTRIAXONE <=1 SENSITIVE Sensitive     CIPROFLOXACIN <=0.25 SENSITIVE Sensitive     GENTAMICIN <=1 SENSITIVE Sensitive     IMIPENEM <=0.25 SENSITIVE Sensitive     NITROFURANTOIN <=16 SENSITIVE Sensitive     TRIMETH/SULFA <=20 SENSITIVE Sensitive     AMPICILLIN/SULBACTAM <=2 SENSITIVE Sensitive     PIP/TAZO <=4 SENSITIVE Sensitive     Extended ESBL NEGATIVE Sensitive     * >=100,000 COLONIES/mL ESCHERICHIA COLI  Surgical PCR screen     Status: None   Collection Time: 02/10/17  5:28 AM  Result Value Ref Range Status   MRSA, PCR NEGATIVE NEGATIVE Final   Staphylococcus aureus NEGATIVE NEGATIVE Final    Comment:        The Xpert SA Assay (FDA approved for NASAL specimens in patients over 66 years of age), is one component of a comprehensive surveillance program.  Test performance has been validated by [facility] for patients greater than or equal to 15 year old. It is not intended to diagnose infection nor to guide or monitor treatment.          Radiology Studies: Dg Chest Port 1 View  Result Date: 02/10/2017 CLINICAL DATA:  Central line placement. EXAM: PORTABLE  CHEST 1 VIEW COMPARISON:  02/06/2017 FINDINGS: Large bore right internal jugular approach central venous catheter terminates in the expected location of the right atrium. The cardiac silhouette is enlarged. Mediastinal contours appear intact. There is no evidence of focal airspace consolidation, pleural effusion or pneumothorax. Interstitial pulmonary edema. Osseous structures are without acute abnormality. Soft tissues are grossly normal. IMPRESSION: Right internal jugular approach central venous catheter terminates in the expected location of the right atrium. Enlarged cardiac silhouette with interstitial pulmonary edema. These results will be called to the ordering clinician or representative by the Radiologist Assistant, and communication documented in the PACS or zVision Dashboard. Electronically Signed   By: Fidela Salisbury M.D.   On: 02/10/2017 15:26   Dg Fluoro Guide Cv Line-no Report  Result Date: 02/10/2017 Fluoroscopy was utilized by the requesting physician.  No radiographic interpretation.        Scheduled Meds: . aspirin EC  81 mg Oral Daily  . brimonidine  1 drop Right Eye BID   And  . timolol  1 drop Right Eye BID  . calcitRIOL  0.5 mcg Oral Daily  . calcium acetate  667 mg Oral TID WC  . carvedilol  12.5 mg Oral BID WC  . cephALEXin  500 mg Oral Q12H  . heparin  5,000 Units Subcutaneous Q8H  . hydrALAZINE  50 mg Oral TID  . insulin aspart  0-9 Units Subcutaneous TID WC  . insulin detemir  20 Units Subcutaneous QHS  . isosorbide mononitrate  30 mg Oral Daily   Continuous Infusions: . sodium chloride 10 mL/hr at 02/10/17 1145  . furosemide Stopped (02/11/17 2323)     LOS: 5 days    Time spent: 35 minutes.     Hosie Poisson, MD [facility] Pager [phone]  If 7PM-7AM, please contact night-coverage [email or web address] Password [email or web address] 02/12/2017, 8:15 AM

## 2017-02-12 NOTE — Progress Notes (Signed)
[facility] Progress Note    Assessment/ Plan:   1. AKI/CKD stage 4-5 in setting of decompensated CHF/cardiorenal syndrome. She has had progressive CKD for several years and is now at stage V CKD with decompensated CHF. Will plan for placement of HD cath and AVF/AVG by [facility] today and initiate HD after access has been placed. She will also need to have outpatient HD arranged once stable for discharge. - Appreciate [facility] placing access and catheter - CLIP - HD #1 (7/6), HD #2 (7/7) - Plan on HD Mon  2. Acute diastolic and systolic CHF- minimal response with IV Lasix 60mg  will increase dose and follow. 3. Anemic of CKD stage 5- stable but may need to initiate ESA if falls. Will check iron stores 4. DM- per primary 5. HTN- stable 6. Obesity 7. Protein malnutrition- renal diet and may benefit from protein supplements 8. SHPTH- on calcitriol as well as phoslo for binder  Subjective:   Nausea has resolved. Denies f/c or dyspnea.   Objective:   BP 99/86 (BP Location: Right Arm)   Pulse 79   Temp 98.2 F (36.8 C) (Oral)   Resp 15   Ht 5\' 3"  (1.6 m)   Wt 82.6 kg (182 lb 1.6 oz) Comment: c scal  SpO2 100%   BMI 32.26 kg/m   Intake/Output Summary (Last 24 hours) at 02/12/17 0806 Last data filed at 02/12/17 0755  Gross per 24 hour  Intake              354 ml  Output              625 ml  Net             -271 ml   Weight change: -1 kg (-2 lb 3.3 oz)  Physical Exam: General appearance: cooperative, fatigued and mild distress Head: Normocephalic, without obvious abnormality, atraumatic Resp: rales bibasilar - 1/4 up from base Cardio: regular rate and rhythm and no rub GI: soft, non-tender; bowel sounds normal; no masses, no organomegaly Extremities: has compression stockings on Access: lt BCF w/ good bruit, RIJ cath  Imaging: Dg Chest Port 1 View  Result Date: 02/10/2017 CLINICAL DATA:  Central line placement. EXAM: PORTABLE CHEST 1 VIEW COMPARISON:   02/06/2017 FINDINGS: Large bore right internal jugular approach central venous catheter terminates in the expected location of the right atrium. The cardiac silhouette is enlarged. Mediastinal contours appear intact. There is no evidence of focal airspace consolidation, pleural effusion or pneumothorax. Interstitial pulmonary edema. Osseous structures are without acute abnormality. Soft tissues are grossly normal. IMPRESSION: Right internal jugular approach central venous catheter terminates in the expected location of the right atrium. Enlarged cardiac silhouette with interstitial pulmonary edema. These results will be called to the ordering clinician or representative by the Radiologist Assistant, and communication documented in the PACS or zVision Dashboard. Electronically Signed   By: Fidela Salisbury M.D.   On: 02/10/2017 15:26   Dg Fluoro Guide Cv Line-no Report  Result Date: 02/10/2017 Fluoroscopy was utilized by the requesting physician.  No radiographic interpretation.    Labs: BMET  Recent Labs Lab 02/07/17 0029 02/08/17 0331 02/09/17 0320 02/10/17 0352 02/10/17 1819 02/11/17 1100  NA 139 136 134* 133* 134* 134*  K 4.6 4.9 4.9 5.5* 5.6* 4.9  CL 106 102 99* 96* 98* 97*  CO2 23 25 26 27 23 29   GLUCOSE 106* 171* 153* 144* 145* 81  BUN 63* 71* 81* 89* 88* 54*  CREATININE 4.40* 4.54*  4.79* 5.11* 5.05* 3.96*  CALCIUM 8.5* 8.2* 8.5* 8.6* 8.5* 8.5*  PHOS  --  6.0* 5.7* 5.9* 6.6* 6.3*   CBC  Recent Labs Lab 02/07/17 0029 02/10/17 0352 02/10/17 1819 02/11/17 1100  WBC 5.6 3.9* 5.4 4.1  HGB 10.6* 10.2* 10.7* 9.4*  HCT 35.2* 33.5* 35.3* 31.8*  MCV 88.7 86.8 87.8 89.6  PLT 295 291 295 272    Medications:    . aspirin EC  81 mg Oral Daily  . brimonidine  1 drop Right Eye BID   And  . timolol  1 drop Right Eye BID  . calcitRIOL  0.5 mcg Oral Daily  . calcium acetate  667 mg Oral TID WC  . carvedilol  12.5 mg Oral BID WC  . cephALEXin  500 mg Oral Q12H  . heparin   5,000 Units Subcutaneous Q8H  . hydrALAZINE  50 mg Oral TID  . insulin aspart  0-9 Units Subcutaneous TID WC  . insulin detemir  20 Units Subcutaneous QHS  . isosorbide mononitrate  30 mg Oral Daily      Otelia Santee, MD 02/12/2017, 8:06 AM

## 2017-02-13 ENCOUNTER — Telehealth: Payer: Self-pay | Admitting: Vascular Surgery

## 2017-02-13 LAB — CBC
HCT: 30.4 % — ABNORMAL LOW (ref 36.0–46.0)
HEMOGLOBIN: 8.9 g/dL — AB (ref 12.0–15.0)
MCH: 26.6 pg (ref 26.0–34.0)
MCHC: 29.3 g/dL — AB (ref 30.0–36.0)
MCV: 90.7 fL (ref 78.0–100.0)
Platelets: 249 10*3/uL (ref 150–400)
RBC: 3.35 MIL/uL — AB (ref 3.87–5.11)
RDW: 14.6 % (ref 11.5–15.5)
WBC: 5.6 10*3/uL (ref 4.0–10.5)

## 2017-02-13 LAB — GLUCOSE, CAPILLARY
GLUCOSE-CAPILLARY: 74 mg/dL (ref 65–99)
GLUCOSE-CAPILLARY: 155 mg/dL — AB (ref 65–99)
GLUCOSE-CAPILLARY: 126 mg/dL — AB (ref 65–99)

## 2017-02-13 LAB — RENAL FUNCTION PANEL
ALBUMIN: 2.7 g/dL — AB (ref 3.5–5.0)
ANION GAP: 8 (ref 5–15)
BUN: 35 mg/dL — ABNORMAL HIGH (ref 6–20)
CALCIUM: 8.7 mg/dL — AB (ref 8.9–10.3)
CO2: 28 mmol/L (ref 22–32)
Chloride: 94 mmol/L — ABNORMAL LOW (ref 101–111)
Creatinine, Ser: 4.34 mg/dL — ABNORMAL HIGH (ref 0.44–1.00)
GFR, EST AFRICAN AMERICAN: 11 mL/min — AB (ref >60)
GFR, EST NON AFRICAN AMERICAN: 9 mL/min — AB (ref >60)
GLUCOSE: 79 mg/dL (ref 65–99)
PHOSPHORUS: 5.8 mg/dL — AB (ref 2.5–4.6)
Potassium: 4.3 mmol/L (ref 3.5–5.1)
SODIUM: 130 mmol/L — AB (ref 135–145)

## 2017-02-13 MED ORDER — DARBEPOETIN ALFA 60 MCG/0.3ML IJ SOSY
PREFILLED_SYRINGE | INTRAMUSCULAR | Status: AC
  Filled 2017-02-13: qty 0.3

## 2017-02-13 MED ORDER — DARBEPOETIN ALFA 60 MCG/0.3ML IJ SOSY
60.0000 ug | PREFILLED_SYRINGE | INTRAMUSCULAR | Status: DC
  Administered 2017-02-13 – 2017-02-20 (×2): 60 ug via INTRAVENOUS
  Filled 2017-02-13: qty 0.3

## 2017-02-13 NOTE — Telephone Encounter (Signed)
Sched appt 03/30/17; lab at 2:30 and MD at 3:15. Hm# vm full, cell# not accepting calls, lm on son's #.

## 2017-02-13 NOTE — Progress Notes (Signed)
PROGRESS NOTE    SARINAH DOETSCH  HEN:[id number] DOB: [birth year] DOA: 02/06/2017 PCP: Benito Mccreedy, MD    Brief Narrative: 71 year old lady with prior h/o stage 4 to 5  CKD, systolic and diastolic CHF, dm, hypertension, hyperlipidemia, CAD, anemia of chronic disease, diabetes Mellitus,  comes in for doe and LE edema. She was initially started on IV lasix, without much diuresis, instead her creatinine started worsening. Nephrology consulted, recommended initiating HD and vascular surgery consulted vein mapping, placement of HD catheter and AVF/ AVG.  She is undergoing HD and outpatient HD to be set up , clip in process.   Assessment & Plan:   Principal Problem:   Acute on chronic combined systolic and diastolic congestive heart failure (HCC) Active Problems:   Hypertensive urgency   CKD stage 4 due to type 2 diabetes mellitus (HCC)   CHF (congestive heart failure) (HCC)   Acute on chronic systolic and diastolic heart failure: Possibly from non compliant to meds and cardio renal syndrome.  Repeat ECHO done ,showed LVEF of 45%, with diffuse hypokinesis, and grade 1 diastolic dysfunction.  Was on lasix 80 mg BID, without much diuresis, changed to Lasix 120 mg BID.  Nephrology consulted and recommended initiating HD.  Vascular surgery consulted on 7/5 , underwent vein mapping.   She had right IJ  HD catheter placed in addition to a left brachial cephalic AV fistula for HD on 7/6.  Vascular recommended follow up with with Dr Oneida Alar in 6 weeks in the office.  HD initiated on 7/6, 7/7, 7/9.    Acute on Stage 4 CKD:  ? Cardio renal syndrome.  Worsening renal parameters on IV lasix, nephrology consulted,,  Underwent placement of HD cath and left UE AV fistula.  HD started and her breathing has improved.  She will need outpatient HD set up . Clip in process.   Hyperkalemia, hyponatremia, worsening renal parameters.  Management with HD.   Hypertensive urgency.  Improved .   Mild  normocytic anemia: anemia of chronic disease.  Hemoglobin dropped from 10 to 8.9, she will need aranesp with HD.    Diabetes Mellitus:  CBG (last 3)   Recent Labs  02/12/17 1649 02/12/17 2233 02/13/17 1153  GLUCAP 130* 171* 74    Her CBG'S running low, decreased the dose of the long acting insulin to 15 units of levemir daily from 20 units.  Resume SSI.   Protein energy malnutrition Nutrition:  Renal diet and supplementation.    Hyperlipidemia: not on statin.  CAD; No chest pain.  Resume aspirin coreg.    Elevated troponins:  Possibly from demand ischemia from HF.  No chest pain.    E coli UTI:  Start keflex 500 mg bid.    DVT prophylaxis: sq heparin.  Code Status: DNR.  Family Communication: NONE AT BEDSIDE.  Disposition Plan: home when outpatient HD is set up.    Consultants:   Nephrology consult.   Vascular consult   Procedures:   Echocardiogram  Vein mapping  Right IJ  HD catheter placed in addition to a left brachial cephalic AV fistula for HD on 7/6.    Antimicrobials: keflex for UTI.    Subjective: NO NEW COMPLAINTS.  Sleepy.     Objective: Vitals:   02/13/17 0935 02/13/17 1000 02/13/17 1030 02/13/17 1109  BP: (!) 149/69 (!) 147/65 128/65 134/61  Pulse: 91 91 91 92  Resp: 18   18  Temp:    97.8 F (36.6 C)  TempSrc:  Oral  SpO2:    99%  Weight:    80.9 kg (178 lb 5.6 oz)  Height:        Intake/Output Summary (Last 24 hours) at 02/13/17 1446 Last data filed at 02/13/17 1109  Gross per 24 hour  Intake                0 ml  Output             2350 ml  Net            -2350 ml   Filed Weights   02/13/17 0609 02/13/17 0739 02/13/17 1109  Weight: 82.9 kg (182 lb 11.2 oz) 82.8 kg (182 lb 8.7 oz) 80.9 kg (178 lb 5.6 oz)    Examination:  General exam: alert and comfortable, lethargic.  Respiratory system: clear, air entry fair.  no wheezing or rhonchi.  Cardiovascular system: S1 & S2 heard, RRR. No JVD, murmurs,  improving pedal edema.  Gastrointestinal system: Abdomen is soft non tender non distended bowel sounds heard.  Central nervous system: Alert and oriented. No focal neurological deficits. Extremities: leg edema improved. Leg wound bandaged.  Skin: No rashes, lesions or ulcers Psychiatry: Mood & affect appropriate.     Data Reviewed: I have personally reviewed following labs and imaging studies  CBC:  Recent Labs Lab 02/07/17 0029 02/10/17 0352 02/10/17 1819 02/11/17 1100 02/13/17 0730  WBC 5.6 3.9* 5.4 4.1 5.6  HGB 10.6* 10.2* 10.7* 9.4* 8.9*  HCT 35.2* 33.5* 35.3* 31.8* 30.4*  MCV 88.7 86.8 87.8 89.6 90.7  PLT 295 291 295 272 096   Basic Metabolic Panel:  Recent Labs Lab 02/09/17 0320 02/10/17 0352 02/10/17 1819 02/11/17 1100 02/13/17 0730  NA 134* 133* 134* 134* 130*  K 4.9 5.5* 5.6* 4.9 4.3  CL 99* 96* 98* 97* 94*  CO2 26 27 23 29 28   GLUCOSE 153* 144* 145* 81 79  BUN 81* 89* 88* 54* 35*  CREATININE 4.79* 5.11* 5.05* 3.96* 4.34*  CALCIUM 8.5* 8.6* 8.5* 8.5* 8.7*  PHOS 5.7* 5.9* 6.6* 6.3* 5.8*   GFR: Estimated Creatinine Clearance: 11.8 mL/min (A) (by C-G formula based on SCr of 4.34 mg/dL (H)). Liver Function Tests:  Recent Labs Lab 02/09/17 0320 02/10/17 0352 02/10/17 1819 02/11/17 1100 02/13/17 0730  ALBUMIN 2.9* 2.9* 3.0* 2.6* 2.7*   No results for input(s): LIPASE, AMYLASE in the last 168 hours. No results for input(s): AMMONIA in the last 168 hours. Coagulation Profile: No results for input(s): INR, PROTIME in the last 168 hours. Cardiac Enzymes:  Recent Labs Lab 02/07/17 0705 02/07/17 1120 02/07/17 1600  TROPONINI 0.06* 0.06* 0.06*   BNP (last 3 results) No results for input(s): PROBNP in the last 8760 hours. HbA1C: No results for input(s): HGBA1C in the last 72 hours. CBG:  Recent Labs Lab 02/12/17 0713 02/12/17 1314 02/12/17 1649 02/12/17 2233 02/13/17 1153  GLUCAP 78 191* 130* 171* 74   Lipid Profile: No results for  input(s): CHOL, HDL, LDLCALC, TRIG, CHOLHDL, LDLDIRECT in the last 72 hours. Thyroid Function Tests: No results for input(s): TSH, T4TOTAL, FREET4, T3FREE, THYROIDAB in the last 72 hours. Anemia Panel: No results for input(s): VITAMINB12, FOLATE, FERRITIN, TIBC, IRON, RETICCTPCT in the last 72 hours. Sepsis Labs: No results for input(s): PROCALCITON, LATICACIDVEN in the last 168 hours.  Recent Results (from the past 240 hour(s))  Culture, Urine     Status: Abnormal   Collection Time: 02/08/17  1:51 PM  Result Value Ref Range Status  Specimen Description URINE, RANDOM  Final   Special Requests NONE  Final   Culture >=100,000 COLONIES/mL ESCHERICHIA COLI (A)  Final   Report Status 02/11/2017 FINAL  Final   Organism ID, Bacteria ESCHERICHIA COLI (A)  Final      Susceptibility   Escherichia coli - MIC*    AMPICILLIN 16 INTERMEDIATE Intermediate     CEFAZOLIN <=4 SENSITIVE Sensitive     CEFTRIAXONE <=1 SENSITIVE Sensitive     CIPROFLOXACIN <=0.25 SENSITIVE Sensitive     GENTAMICIN <=1 SENSITIVE Sensitive     IMIPENEM <=0.25 SENSITIVE Sensitive     NITROFURANTOIN <=16 SENSITIVE Sensitive     TRIMETH/SULFA <=20 SENSITIVE Sensitive     AMPICILLIN/SULBACTAM <=2 SENSITIVE Sensitive     PIP/TAZO <=4 SENSITIVE Sensitive     Extended ESBL NEGATIVE Sensitive     * >=100,000 COLONIES/mL ESCHERICHIA COLI  Surgical PCR screen     Status: None   Collection Time: 02/10/17  5:28 AM  Result Value Ref Range Status   MRSA, PCR NEGATIVE NEGATIVE Final   Staphylococcus aureus NEGATIVE NEGATIVE Final    Comment:        The Xpert SA Assay (FDA approved for NASAL specimens in patients over 39 years of age), is one component of a comprehensive surveillance program.  Test performance has been validated by [facility] for patients greater than or equal to 58 year old. It is not intended to diagnose infection nor to guide or monitor treatment.          Radiology Studies: No results  found.      Scheduled Meds: . aspirin EC  81 mg Oral Daily  . brimonidine  1 drop Right Eye BID   And  . timolol  1 drop Right Eye BID  . calcitRIOL  0.5 mcg Oral Daily  . calcium acetate  667 mg Oral TID WC  . carvedilol  12.5 mg Oral BID WC  . cephALEXin  500 mg Oral Q12H  . darbepoetin (ARANESP) injection - DIALYSIS  60 mcg Intravenous Q Mon-HD  . heparin  5,000 Units Subcutaneous Q8H  . hydrALAZINE  50 mg Oral TID  . insulin aspart  0-9 Units Subcutaneous TID WC  . insulin detemir  15 Units Subcutaneous QHS  . isosorbide mononitrate  30 mg Oral Daily   Continuous Infusions: . sodium chloride 10 mL/hr at 02/10/17 1145  . furosemide Stopped (02/13/17 1254)     LOS: 6 days    Time spent: 35 minutes.     Hosie Poisson, MD [facility] Pager [phone]  If 7PM-7AM, please contact night-coverage [email or web address] Password [email or web address] 02/13/2017, 2:46 PM

## 2017-02-13 NOTE — Clinical Social Work Note (Signed)
CSW met with patient to discuss SNF placement. Patient with eyes closed and very lethargic. CSW asked if now was a good time and patient shook her head no. CSW will try again when patient is more awake.  Dayton Scrape, [place]

## 2017-02-13 NOTE — Telephone Encounter (Signed)
-----   Message from Mena Goes, RN sent at 02/11/2017  9:46 PM EDT ----- Regarding: 6 weeks w/ duplex   ----- Message ----- From: Ulyses Amor, PA-C Sent: 02/11/2017   9:29 AM To: [facility]vs Charge Pool  S/P left AV fistula needs f/u in 6 weeks with fistula duplex Dr. Oneida Alar

## 2017-02-13 NOTE — Procedures (Signed)
I was present at this dialysis session. I have reviewed the session itself and made appropriate changes (decreased bfr to 300 and dfr 600 as this is only her 3rd HD treatment).    Filed Weights   02/12/17 0444 02/13/17 0609 02/13/17 0739  Weight: 82.6 kg (182 lb 1.6 oz) 82.9 kg (182 lb 11.2 oz) 82.8 kg (182 lb 8.7 oz)     Recent Labs Lab 02/13/17 0730  NA 130*  K 4.3  CL 94*  CO2 28  GLUCOSE 79  BUN 35*  CREATININE 4.34*  CALCIUM 8.7*  PHOS 5.8*     Recent Labs Lab 02/10/17 1819 02/11/17 1100 02/13/17 0730  WBC 5.4 4.1 5.6  HGB 10.7* 9.4* 8.9*  HCT 35.3* 31.8* 30.4*  MCV 87.8 89.6 90.7  PLT 295 272 249    Scheduled Meds: . aspirin EC  81 mg Oral Daily  . brimonidine  1 drop Right Eye BID   And  . timolol  1 drop Right Eye BID  . calcitRIOL  0.5 mcg Oral Daily  . calcium acetate  667 mg Oral TID WC  . carvedilol  12.5 mg Oral BID WC  . cephALEXin  500 mg Oral Q12H  . heparin  5,000 Units Subcutaneous Q8H  . hydrALAZINE  50 mg Oral TID  . insulin aspart  0-9 Units Subcutaneous TID WC  . insulin detemir  15 Units Subcutaneous QHS  . isosorbide mononitrate  30 mg Oral Daily   Continuous Infusions: . sodium chloride 10 mL/hr at 02/10/17 1145  . furosemide Stopped (02/12/17 1941)   PRN Meds:.acetaminophen **OR** acetaminophen, meclizine, morphine injection, ondansetron (ZOFRAN) IV, oxyCODONE-acetaminophen     Assessment/Plan: 1.  AKI/CKD stage 4-5 in setting of decompensated CHF/cardiorenal syndrome.  She has had progressive CKD for several years and is now at stage V CKD with decompensated CHF.   1. S/p RIJ tunneled HD cath and LUE AVF on 02/10/17 by Dr. Oneida Alar  2. HD initiated on 02/11/17 3. Awaiting outpatient HD to be arranged once stable for discharge. 2. Acute diastolic and systolic CHF- minimal response with IV Lasix now with HD and UF as BP tolerates.  3. Anemic of CKD stage 5 with ABLA due to surgery- will initiate ESA and follow iron stores. 4. DM- per  primary 5. HTN- stable 6. Obesity 7. Protein malnutrition- renal diet and may benefit from protein supplements 8. SHPTH- on calcitriol as well as phoslo for binder  Vascular access- have consulted [facility] for placement of HD cath and AVF/AVG  Donetta Potts,  MD 02/13/2017, 8:28 AM

## 2017-02-13 NOTE — Progress Notes (Signed)
Physical Therapy Treatment Patient Details Name: Kathleen [name] MRN: [id number] DOB: [birth year] Today's Date: 02/13/2017    History of Present Illness 71 yo admitted with SOb and bil LE edema, CHF. PMHx: CKD, CAD, HTN, CHF, DM.  Found to have CKD stage V and now s/p L UE braciocephaic fistula on 7/6 and initiated HD via R IJ catheter 7/6.    PT Comments    Patient with decline in status from last session.  Unable to stand without max A and was unable to take steps.  Seems fearful of falling, still lethargic s/p HD and c/o pain in knees.  Continue to feel appropriate for SNF level rehab at d/c.  Will follow along acutely.    Follow Up Recommendations  SNF;Supervision/Assistance - 24 hour     Equipment Recommendations  None recommended by PT    Recommendations for Other Services       Precautions / Restrictions Precautions Precautions: Fall Precaution Comments: L UE     Mobility  Bed Mobility               General bed mobility comments: up in chair  Transfers Overall transfer level: Needs assistance   Transfers: Sit to/from Stand Sit to Stand: Max assist         General transfer comment: attempted three times with lifting pad under pt and she terminated attempts due to knee pain/fear of falling; then assist with gait belt from from to lift into standing  Ambulation/Gait     Assistive device: Rolling walker (2 wheeled)       General Gait Details: attempted to initiate several times, pt refused and returned to sitting   Stairs            Wheelchair Mobility    Modified Rankin (Stroke Patients Only)       Balance Overall balance assessment: Needs assistance         Standing balance support: Bilateral upper extremity supported Standing balance-Leahy Scale: Poor Standing balance comment: mod A for maintaining standing due to pt leaning back with legs on chair and needing assist for keeping from leaning back                             Cognition Arousal/Alertness: Lethargic Behavior During Therapy: Flat affect Overall Cognitive Status: Within Functional Limits for tasks assessed                                        Exercises      General Comments General comments (skin integrity, edema, etc.): SpO2 on RA 85%, applied O2 @ 2LPM and cues for PLB up to 92%, after session on O2 96%      Pertinent Vitals/Pain Pain Assessment: Faces Faces Pain Scale: Hurts whole lot Pain Location: both knees with attempts to stand Pain Descriptors / Indicators: Aching;Guarding;Grimacing Pain Intervention(s): Monitored during session;Repositioned    Home Living                      Prior Function            PT Goals (current goals can now be found in the care plan section) Progress towards PT goals: Not progressing toward goals - comment    Frequency    Min 3X/week      PT Plan Current plan remains appropriate  Co-evaluation              AM-PAC PT "6 Clicks" Daily Activity  Outcome Measure  Difficulty turning over in bed (including adjusting bedclothes, sheets and blankets)?: A Lot Difficulty moving from lying on back to sitting on the side of the bed? : Total Difficulty sitting down on and standing up from a chair with arms (e.g., wheelchair, bedside commode, etc,.)?: Total Help needed moving to and from a bed to chair (including a wheelchair)?: Total Help needed walking in hospital room?: Total Help needed climbing 3-5 steps with a railing? : Total 6 Click Score: 7    End of Session Equipment Utilized During Treatment: Gait belt;Oxygen Activity Tolerance: Patient limited by fatigue Patient left: in chair;with call bell/phone within reach;with family/visitor present Nurse Communication: Mobility status;Other (comment) (question nausea after session) PT Visit Diagnosis: Other abnormalities of gait and mobility (R26.89);Muscle weakness (generalized) (M62.81)     Time:  6950-7225 PT Time Calculation (min) (ACUTE ONLY): 20 min  Charges:  $Therapeutic Activity: 8-22 mins                    G CodesMagda [name], [place] [phone] 02/13/2017    Kathleen [name] 02/13/2017, 5:10 PM

## 2017-02-14 ENCOUNTER — Inpatient Hospital Stay (HOSPITAL_COMMUNITY): Payer: Medicare Other

## 2017-02-14 ENCOUNTER — Other Ambulatory Visit: Payer: Self-pay

## 2017-02-14 DIAGNOSIS — G8929 Other chronic pain: Secondary | ICD-10-CM

## 2017-02-14 DIAGNOSIS — M25562 Pain in left knee: Secondary | ICD-10-CM

## 2017-02-14 DIAGNOSIS — N185 Chronic kidney disease, stage 5: Secondary | ICD-10-CM

## 2017-02-14 DIAGNOSIS — I770 Arteriovenous fistula, acquired: Secondary | ICD-10-CM

## 2017-02-14 DIAGNOSIS — M25561 Pain in right knee: Secondary | ICD-10-CM

## 2017-02-14 LAB — FERRITIN: FERRITIN: 101 ng/mL (ref 11–307)

## 2017-02-14 LAB — GLUCOSE, CAPILLARY
GLUCOSE-CAPILLARY: 98 mg/dL (ref 65–99)
GLUCOSE-CAPILLARY: 135 mg/dL — AB (ref 65–99)
GLUCOSE-CAPILLARY: 129 mg/dL — AB (ref 65–99)
Glucose-Capillary: 146 mg/dL — ABNORMAL HIGH (ref 65–99)

## 2017-02-14 LAB — IRON AND TIBC
IRON: 30 ug/dL (ref 28–170)
Saturation Ratios: 14 % (ref 10.4–31.8)
TIBC: 216 ug/dL — ABNORMAL LOW (ref 250–450)
UIBC: 186 ug/dL

## 2017-02-14 MED ORDER — RENA-VITE PO TABS
1.0000 | ORAL_TABLET | Freq: Every day | ORAL | Status: DC
  Administered 2017-02-14 – 2017-02-19 (×6): 1 via ORAL
  Filled 2017-02-14 (×6): qty 1

## 2017-02-14 MED ORDER — POLYETHYLENE GLYCOL 3350 17 G PO PACK
17.0000 g | PACK | Freq: Every day | ORAL | Status: DC
  Administered 2017-02-14 – 2017-02-18 (×5): 17 g via ORAL
  Filled 2017-02-14 (×6): qty 1

## 2017-02-14 NOTE — Progress Notes (Signed)
Initial Nutrition Assessment  DOCUMENTATION CODES:   Not applicable  INTERVENTION:   -Pt agreeable to small snacks between meals  -Pt decline supplement at this time; did not think she will be able to tolerate. Recommend addition of Nepro supplement once N/V improves  -Provided pt with written material on Dialysis/Renal Diet. Pt very nauseous, vomiting on visit today. Will provide verbal education on follow-up. Pt will also receive further education post discharge from outpatient RD at [facility]  -If pt continues without BM, recommend further intervention regarding bowel regimen   NUTRITION DIAGNOSIS:   Inadequate oral intake related to acute illness, chronic illness, nausea, vomiting, poor appetite as evidenced by per patient/family report.  GOAL:   Patient will meet greater than or equal to 90% of their needs  MONITOR:   PO intake, Supplement acceptance, Labs, Weight trends  REASON FOR ASSESSMENT:   Malnutrition Screening Tool    ASSESSMENT:   71 yo female admitted with acute on chronic CHF, acute on chronic CKD now progressed to ESRD secondary to DM/HTN and cardiorenal syndrome; pt initiated on HD this admission. Pt with hx of CHF, CKD IV, DM, CAD, HTN, SHPTH  7/6 right IJ HD cath in addition to left brachial cephalic AV fistula placed  Pt is very weak Pt complains of severe nausea, vomited bilious appearing liquid on visit this AM. Noted pt is constipated, no BM since 7/4, miralax ordered today.   Pt did not want breakfast this AM. Pt with very poor appetite. Per Shirlee Limerick RN, yesterday pt ate fruit only for Breakfast and Lunch and did not eat any dinner.   UOP 450 mL recorded, HD yesterday with 2 L fluid removed  Per weight encounters, no significant wt loss.   Nutrition-Focused physical exam completed. Findings are no fat depletion, mild muscle depletion, and mild edema.   Labs: sodium 130, phosphorus 5.8, Creatinine 4.32, corrected calcium 9.7 Meds:  calcitriol, phoslo, ss novolog, levemir, Rena-Vit miralax daily ordered today, zofran  Diet Order:  Diet renal with fluid restriction Fluid restriction: 1200 mL Fluid; Room service appropriate? Yes; Fluid consistency: Thin  Skin:  Reviewed, no issues  Last BM:  02/08/17 constipated  Height:   Ht Readings from Last 1 Encounters:  02/07/17 5\' 3"  (1.6 m)    Weight:   Wt Readings from Last 1 Encounters:  02/14/17 163 lb (73.9 kg)    Ideal Body Weight:     BMI:  Body mass index is 28.87 kg/m.  Estimated Nutritional Needs:   Kcal:  2100-2300 kcals  Protein:  86-108 g  Fluid:  1000 mL plus UOP  EDUCATION NEEDS:   Education needs addressed (written, verbal education to follow)  Kerman Passey MS, [place], LDN [phone] Pager  [phone] Weekend/On-Call Pager

## 2017-02-14 NOTE — Clinical Social Work Note (Signed)
Clinical Social Work Assessment  Patient Details  Name: Kathleen [name] MRN: [id number] Date of Birth: 07-26-1946  Date of referral:  02/14/17               Reason for consult:  Facility Placement, Discharge Planning                Permission sought to share information with:  Chartered certified accountant granted to share information::  Yes, Verbal Permission Granted  Name::        Agency::  SNF's  Relationship::     Contact Information:     Housing/Transportation Living arrangements for the past 2 months:  Single Family Home Source of Information:  Patient, Medical Team Patient Interpreter Needed:  None Criminal Activity/Legal Involvement Pertinent to Current Situation/Hospitalization:  No - Comment as needed Significant Relationships:  Adult Children Lives with:  Self Do you feel safe going back to the place where you live?  Yes Need for family participation in patient care:  Yes (Comment)  Care giving concerns:  PT recommending SNF once medically stable for discharge.   Social Worker assessment / plan:  CSW met with patient. No supports at bedside. CSW introduced role and explained that PT recommendations would be discussed. Patient agreeable to SNF placement. First preference is [facility]. CSW notified admissions coordinator. No further concerns. CSW encouraged patient to contact CSW as needed. CSW will continue to follow patient for support and facilitate discharge to SNF once medically stable.  Employment status:  Retired Nurse, adult PT Recommendations:  [place] / Referral to community resources:  [place]  Patient/Family's Response to care:  Patient agreeable to SNF placement. Patient's children supportive and involved in patient's care. Patient appreciated social work intervention.  Patient/Family's Understanding of and Emotional Response to Diagnosis, Current Treatment, and  Prognosis:  Patient has a good understanding of the reason for admission and her need for rehab prior to returning home. Patient appears happy with hospital care.  Emotional Assessment Appearance:  Appears stated age Attitude/Demeanor/Rapport:  Other (Pleasant) Affect (typically observed):  Accepting, Appropriate, Calm, Pleasant Orientation:  Oriented to Self, Oriented to Place, Oriented to  Time, Oriented to Situation Alcohol / Substance use:  Tobacco Use Psych involvement (Current and /or in the community):  No (Comment)  Discharge Needs  Concerns to be addressed:  Care Coordination Readmission within the last 30 days:  No Current discharge risk:  Dependent with Mobility, Lives alone Barriers to Discharge:  Continued Medical Work up, Waiting for outpatient dialysis   Candie Chroman, LCSW 02/14/2017, 12:00 PM

## 2017-02-14 NOTE — Progress Notes (Signed)
Re-weigh, patient couldn't stand long enough for accurate weight.

## 2017-02-14 NOTE — Progress Notes (Signed)
Physical Therapy Treatment Patient Details Name: Kathleen [name] MRN: [id number] DOB: 08-16-1945 Today's Date: 02/14/2017    History of Present Illness 71 yo admitted with SOb and bil LE edema, CHF. PMHx: CKD, CAD, HTN, CHF, DM.  Found to have CKD stage V and now s/p L UE braciocephaic fistula on 7/6 and initiated HD via R IJ catheter 7/6.    PT Comments    Patient limited by pain bilat knees. Mod/max A required for transfers and bed mobility. Transport present end of session to take to X ray. Current plan remains appropriate.    Follow Up Recommendations  SNF;Supervision/Assistance - 24 hour     Equipment Recommendations  None recommended by PT    Recommendations for Other Services       Precautions / Restrictions Precautions Precautions: Fall    Mobility  Bed Mobility Overal bed mobility: Needs Assistance Bed Mobility: Sit to Supine       Sit to supine: Max assist   General bed mobility comments: assist to bring bilat LE into bed and to position trunk once in supine; cues for sequencing and technique  Transfers Overall transfer level: Needs assistance Equipment used: Rolling walker (2 wheeled) Transfers: Sit to/from [facility] Sit to Stand: Mod assist;+2 physical assistance Stand pivot transfers: Mod assist       General transfer comment: cues for safe hand placement, sequencing, and posture; LE blocked by therapist; pt maintained flexed trunk throughout transfer  Ambulation/Gait                 Stairs            Wheelchair Mobility    Modified Rankin (Stroke Patients Only)       Balance                                            Cognition Arousal/Alertness: Awake/alert Behavior During Therapy: Flat affect Overall Cognitive Status: Within Functional Limits for tasks assessed                                        Exercises      General Comments        Pertinent Vitals/Pain  Pain Assessment: Faces Faces Pain Scale: Hurts little more Pain Location: bilat knees with sit to stand Pain Descriptors / Indicators: Aching;Guarding;Grimacing Pain Intervention(s): Limited activity within patient's tolerance;Monitored during session;Repositioned    Home Living                      Prior Function            PT Goals (current goals can now be found in the care plan section) Progress towards PT goals: Not progressing toward goals - comment (limited by pain)    Frequency    Min 3X/week      PT Plan Current plan remains appropriate    Co-evaluation              AM-PAC PT "6 Clicks" Daily Activity  Outcome Measure  Difficulty turning over in bed (including adjusting bedclothes, sheets and blankets)?: Total Difficulty moving from lying on back to sitting on the side of the bed? : Total Difficulty sitting down on and standing up from a chair with arms (e.g., wheelchair,  bedside commode, etc,.)?: Total Help needed moving to and from a bed to chair (including a wheelchair)?: A Lot Help needed walking in hospital room?: A Lot Help needed climbing 3-5 steps with a railing? : A Lot 6 Click Score: 9    End of Session Equipment Utilized During Treatment: Gait belt;Oxygen Activity Tolerance: Patient limited by pain (bilat knees) Patient left: in bed;with call bell/phone within reach;Other (comment) (transport in to take for Xray) Nurse Communication: Mobility status PT Visit Diagnosis: Other abnormalities of gait and mobility (R26.89);Muscle weakness (generalized) (M62.81)     Time: 7903-8333 PT Time Calculation (min) (ACUTE ONLY): 10 min  Charges:  $Therapeutic Activity: 8-22 mins                    G Codes:       Earney Navy, PTA Pager: [phone]     Darliss Cheney 02/14/2017, 4:43 PM

## 2017-02-14 NOTE — Care Management Important Message (Signed)
Important Message  Patient Details  Name: Kathleen [name] MRN: [id number] Date of Birth: 1945/12/13   Medicare Important Message Given:  Yes    [name] 02/14/2017, 2:17 PM

## 2017-02-14 NOTE — NC FL2 (Signed)
[place] LEVEL OF CARE SCREENING TOOL     IDENTIFICATION  Patient Name: Kathleen [name] Birthdate: [birth year] Sex: female Admission Date (Current Location): 02/06/2017  [facility] [place] Number:  Herbalist and Address:  [facility] [place]. [facility], [place] [address], [place], [place] [address]      Provider Number: [id number]  Attending Physician Name and Address:  Hosie Poisson, MD  Relative Name and Phone Number:       Current Level of Care: Hospital Recommended Level of Care: [place] Prior Approval Number:    Date Approved/Denied:   PASRR Number: [id number]  Discharge Plan: SNF    Current Diagnoses: Patient Active Problem List   Diagnosis Date Noted  . Peripheral arterial disease ([place]) 02/23/2016  . Insulin dependent diabetes mellitus ([place]) 12/18/2015  . Anemia in chronic kidney disease 12/18/2015  . Acute renal failure superimposed on stage 4 chronic kidney disease ([place]) 08/13/2015  . Dyspnea   . Goals of care, counseling/discussion   . SOB (shortness of breath)   . Encounter for palliative care   . Pulmonary vascular congestion   . Pleural effusion, left   . Acute on chronic renal failure ([place])   . HLD (hyperlipidemia)   . Noncompliance with medication regimen   . Disorientation   . Somnolence   . Acute respiratory failure with hypercapnia ([place])   . Respiratory acidosis   . Hypernatremia   . Systolic and diastolic CHF, acute ([place])   . CHF, acute on chronic ([place]) 08/05/2015  . Acute renal failure superimposed on stage 3 chronic kidney disease ([place]) 08/05/2015  . Elevated troponin 08/05/2015  . CHF (congestive heart failure) ([place]) 08/05/2015  . Acute on chronic heart failure ([place]) 08/05/2015  . Acute on chronic combined systolic and diastolic congestive heart failure ([place])   . AKI (acute kidney injury) ([place])   . Left carotid bruit 11/20/2014  . Syncope and collapse 06/10/2014  . Syncope 06/10/2014  .  Essential hypertension   . Unspecified vitamin D deficiency   . Cellulitis and abscess of right lower extremitiy 02/28/2014  . Chronic combined systolic and diastolic heart failure ([place]) 02/12/2014  . CAD (coronary artery disease)   . Obesity   . Meningioma ([place])   . Anemia   . CKD stage 4 due to type 2 diabetes mellitus ([place]) 01/24/2014  . Hyperlipidemia 01/22/2014  . BPPV (benign paroxysmal positional vertigo) 01/22/2014  . Hypertensive urgency 01/21/2014  . Tobacco abuse 01/21/2014  . Diabetes mellitus due to abnormal insulin ([place]) 01/21/2014    Orientation RESPIRATION BLADDER Height & Weight     Self, Time, Situation, Place  O2 (Nasal Canula 2 L) Incontinent, External catheter Weight: 163 lb (73.9 kg) Height:  5\' 3"  (160 cm)  BEHAVIORAL SYMPTOMS/MOOD NEUROLOGICAL BOWEL NUTRITION STATUS   (None)  (None) Incontinent, Continent Diet (Renal with fluid restriction: 1200 mL.)  AMBULATORY STATUS COMMUNICATION OF NEEDS Skin   Extensive Assist Verbally Surgical wounds, Other (Comment) (Blister. Open/dehisced wound/incision: Left lower leg (Impregnated gauze (bismuth, telfa and kerlix))                       Personal Care Assistance Level of Assistance  Bathing, Feeding, Dressing Bathing Assistance: Limited assistance Feeding assistance: Independent Dressing Assistance: Limited assistance     Functional Limitations Info  Sight, Hearing, Speech Sight Info: Adequate Hearing Info: Adequate Speech Info: Adequate    SPECIAL CARE FACTORS FREQUENCY  PT (By licensed PT),  Blood pressure     PT Frequency: 5 x week              Contractures Contractures Info: Not present    Additional Factors Info  Code Status, Allergies Code Status Info: DNR Allergies Info: NKDA           Current Medications (02/14/2017):  This is the current hospital active medication list Current Facility-Administered Medications  Medication Dose Route Frequency Provider Last Rate Last Dose  .  0.9 %  sodium chloride infusion   Intravenous Continuous Josephine Igo, MD 10 mL/hr at 02/14/17 [phone]    . acetaminophen (TYLENOL) tablet 650 mg  650 mg Oral Q6H PRN Rise Patience, MD   650 mg at 02/10/17 0035   Or  . acetaminophen (TYLENOL) suppository 650 mg  650 mg Rectal Q6H PRN Rise Patience, MD      . aspirin EC tablet 81 mg  81 mg Oral Daily Rise Patience, MD   81 mg at 02/14/17 8341  . brimonidine (ALPHAGAN) 0.2 % ophthalmic solution 1 drop  1 drop Right Eye BID Rise Patience, MD   1 drop at 02/14/17 0920   And  . timolol (TIMOPTIC) 0.5 % ophthalmic solution 1 drop  1 drop Right Eye BID Rise Patience, MD   1 drop at 02/14/17 0920  . calcitRIOL (ROCALTROL) capsule 0.5 mcg  0.5 mcg Oral Daily Rise Patience, MD   0.5 mcg at 02/14/17 9622  . calcium acetate (PHOSLO) capsule 667 mg  667 mg Oral TID WC Rise Patience, MD   667 mg at 02/14/17 2979  . carvedilol (COREG) tablet 12.5 mg  12.5 mg Oral BID WC Rise Patience, MD   12.5 mg at 02/14/17 8921  . cephALEXin (KEFLEX) capsule 500 mg  500 mg Oral Q12H Hosie Poisson, MD   500 mg at 02/14/17 1009  . Darbepoetin Alfa (ARANESP) injection 60 mcg  60 mcg Intravenous Q Mon-HD Donato Heinz, MD   60 mcg at 02/13/17 1007  . heparin injection 5,000 Units  5,000 Units Subcutaneous Q8H Rise Patience, MD   5,000 Units at 02/14/17 [id number]  . hydrALAZINE (APRESOLINE) tablet 50 mg  50 mg Oral TID Rise Patience, MD   50 mg at 02/14/17 1009  . insulin aspart (novoLOG) injection 0-9 Units  0-9 Units Subcutaneous TID WC Rise Patience, MD   2 Units at 02/13/17 1711  . insulin detemir (LEVEMIR) injection 15 Units  15 Units Subcutaneous QHS Hosie Poisson, MD   15 Units at 02/14/17 0012  . isosorbide mononitrate (IMDUR) 24 hr tablet 30 mg  30 mg Oral Daily Rise Patience, MD   30 mg at 02/14/17 7408  . meclizine (ANTIVERT) tablet 12.5 mg  12.5 mg Oral TID PRN Hosie Poisson, MD      .  morphine 2 MG/ML injection 1 mg  1 mg Intravenous Q1H PRN Elam Dutch, MD      . multivitamin (RENA-VIT) tablet 1 tablet  1 tablet Oral QHS Fleet Contras, MD      . ondansetron [facility]) injection 4 mg  4 mg Intravenous Q6H PRN Hosie Poisson, MD   4 mg at 02/14/17 1111  . oxyCODONE-acetaminophen (PERCOCET/ROXICET) 5-325 MG per tablet 1-2 tablet  1-2 tablet Oral Q4H PRN Elam Dutch, MD   2 tablet at 02/13/17 1708  . polyethylene glycol (MIRALAX / GLYCOLAX) packet 17 g  17 g Oral Daily Schorr, Rhetta Mura, NP  17 g at 02/14/17 5374     Discharge Medications: Please see discharge summary for a list of discharge medications.  Relevant Imaging Results:  Relevant Lab Results:   Additional Information SS#: [id number]. HD clipping in process.  Candie Chroman, LCSW

## 2017-02-14 NOTE — Consult Note (Signed)
   [facility] CM Inpatient Consult   02/14/2017  Kathleen [name] [birth year] [id number]   Patient assessed for high risk in the [place] with Stage 4 to 5 CKD, HF, Diabetes.  Notes reveals the patient is for a skilled nursing facility after discharge and after Hemodialysis center has been established per Education officer, museum notes.  No cuurent [facility] needs noted. Noted that her primary care provider is listed as Dr. Iona Beard [facility]. Came by to confirm but patient was vomiting and in nursing care. For questions or referral, please contact:  Natividad Brood, RN BSN [facility] Liaison  [phone] business mobile phone Toll free office [phone]

## 2017-02-14 NOTE — Clinical Social Work Placement (Signed)
   CLINICAL SOCIAL WORK PLACEMENT  NOTE  Date:  02/14/2017  Patient Details  Name: YASMEEN MANKA MRN: [id number] Date of Birth: 03-28-1946  Clinical Social Work is seeking post-discharge placement for this patient at the [place] level of care (*CSW will initial, date and re-position this form in  chart as items are completed):  Yes   Patient/family provided with [facility] Department's list of facilities offering this level of care within the geographic area requested by the patient (or if unable, by the patient's family).  Yes   Patient/family informed of their freedom to choose among providers that offer the needed level of care, that participate in Medicare, Medicaid or managed care program needed by the patient, have an available bed and are willing to accept the patient.  Yes   Patient/family informed of [facility]'s ownership interest in [facility] and [facility], as well as of the fact that they are under no obligation to receive care at these facilities.  PASRR submitted to EDS on 02/14/17     PASRR number received on       Existing PASRR number confirmed on 02/14/17     FL2 transmitted to all facilities in geographic area requested by pt/family on 02/14/17     FL2 transmitted to all facilities within larger geographic area on       Patient informed that his/her managed care company has contracts with or will negotiate with certain facilities, including the following:            Patient/family informed of bed offers received.  Patient chooses bed at       Physician recommends and patient chooses bed at      Patient to be transferred to   on  .  Patient to be transferred to facility by       Patient family notified on   of transfer.  Name of family member notified:        PHYSICIAN Please sign FL2, Please sign DNR     Additional Comment:    _______________________________________________ Candie Chroman,  LCSW 02/14/2017, 12:02 PM

## 2017-02-14 NOTE — Progress Notes (Signed)
PROGRESS NOTE    Kathleen [name]  JQB:[id number] DOB: 05-30-1946 DOA: 02/06/2017 PCP: Benito Mccreedy, MD    Brief Narrative: 71 year old lady with prior h/o stage 4 to 5  CKD, systolic and diastolic CHF, dm, hypertension, hyperlipidemia, CAD, anemia of chronic disease, diabetes Mellitus,  comes in for doe and LE edema. She was initially started on IV lasix, without much diuresis, instead her creatinine started worsening. Nephrology consulted, recommended initiating HD and vascular surgery consulted vein mapping, placement of HD catheter and AVF/ AVG.  She is undergoing HD and outpatient HD to be set up , clip in process.   Assessment & Plan:   Principal Problem:   Acute on chronic combined systolic and diastolic congestive heart failure (HCC) Active Problems:   Hypertensive urgency   CKD stage 4 due to type 2 diabetes mellitus (HCC)   CHF (congestive heart failure) (HCC)   Acute on chronic systolic and diastolic heart failure:  Started on lasix 80 mg BID increased to Lasix 120 mg BID gtt, not much diuresis.  Repeat ECHO done ,showed LVEF of 45%, with diffuse hypokinesis, and grade 1 diastolic dysfunction. Nephrology consulted and recommended initiating HD.  Vascular surgery consulted on 7/5 , underwent vein mapping.   She had right IJ  HD catheter placed in addition to a left brachial cephalic AV fistula for HD on 7/6.  Vascular recommended follow up with with Dr Oneida Alar in 6 weeks in the office.  HD initiated on 7/6, 7/7, 7/9.  Further fluid management with HD.  Lasix discontinued by nephrology.    Acute on Stage 4 CKD:  ? Cardio renal syndrome.  Worsening renal parameters on IV lasix, nephrology consulted,,  Underwent placement of HD cath and left UE AV fistula.  HD started and her breathing has improved.  She will need outpatient HD set up . Clip in process.   Hyperkalemia, hyponatremia, worsening renal parameters.  Management with HD.   Hypertensive urgency.  Improved  .   Mild normocytic anemia: anemia of chronic disease.  Hemoglobin dropped from 10 to 8.9, she will need aranesp with HD.    Diabetes Mellitus:  CBG (last 3)   Recent Labs  02/13/17 2209 02/14/17 0730 02/14/17 1149  GLUCAP 126* 98 135*    Her CBG'S running low, decreased the dose of the long acting insulin to 15 units of levemir daily from 20 units.  Resume SSI. No change in meds.  Protein energy malnutrition Nutrition:  Renal diet and supplementation.    Hyperlipidemia: not on statin.  CAD; No chest pain.  Resume aspirin coreg.    Elevated troponins:  Possibly from demand ischemia from HF.  No chest pain.    E coli UTI:  Start keflex 500 mg bid, complete 7 day course.    Bilateral Knee pain;  Suspect from arthritis X rays ordered to evaluate for effusion.  She might benefit from intraarticular steroid injection.    DVT prophylaxis: sq heparin.  Code Status: DNR.  Family Communication: NONE AT BEDSIDE.  Disposition Plan: snf when outpatient hd is set up.    Consultants:   Nephrology consult.   Vascular consult   Procedures:   Echocardiogram  Vein mapping  Right IJ  HD catheter placed in addition to a left brachial cephalic AV fistula for HD on 7/6.    Antimicrobials: keflex for UTI.    Subjective: Sitting in chair, reports knee pain, no sob or chest pain. No nausea or vomiting no abdominal pain.  Objective: Vitals:   02/13/17 1558 02/13/17 2212 02/14/17 0630 02/14/17 1151  BP: (!) 120/50 (!) 120/50 (!) 168/54 (!) 113/50  Pulse: 87 81 84 72  Resp:  17 17 17   Temp:  97.9 F (36.6 C) 98 F (36.7 C) 98.4 F (36.9 C)  TempSrc:  Oral Oral Oral  SpO2:  93% 100% 100%  Weight:   73.9 kg (163 lb)   Height:        Intake/Output Summary (Last 24 hours) at 02/14/17 1336 Last data filed at 02/14/17 0644  Gross per 24 hour  Intake             1329 ml  Output              450 ml  Net              879 ml   Filed Weights    02/13/17 0739 02/13/17 1109 02/14/17 0630  Weight: 82.8 kg (182 lb 8.7 oz) 80.9 kg (178 lb 5.6 oz) 73.9 kg (163 lb)    Examination:  General exam: alert and comfortable, sitting in the chair on 2 lit of [place] oxygen.  Respiratory system: clear, air entry fair.  no wheezing or rhonchi.  Cardiovascular system: S1 & S2 heard, RRR. No JVD, murmurs, improving pedal edema.  Gastrointestinal system: Abdomen is soft non tender non distended bowel sounds heard.  Central nervous system: Alert and oriented. No focal neurological deficits. Extremities: leg edema improved. Leg wound bandaged. bialteral knee tenderness on the lateral aspect.  Skin: No rashes, lesions or ulcers Psychiatry: Mood & affect appropriate.     Data Reviewed: I have personally reviewed following labs and imaging studies  CBC:  Recent Labs Lab 02/10/17 0352 02/10/17 1819 02/11/17 1100 02/13/17 0730  WBC 3.9* 5.4 4.1 5.6  HGB 10.2* 10.7* 9.4* 8.9*  HCT 33.5* 35.3* 31.8* 30.4*  MCV 86.8 87.8 89.6 90.7  PLT 291 295 272 734   Basic Metabolic Panel:  Recent Labs Lab 02/09/17 0320 02/10/17 0352 02/10/17 1819 02/11/17 1100 02/13/17 0730  NA 134* 133* 134* 134* 130*  K 4.9 5.5* 5.6* 4.9 4.3  CL 99* 96* 98* 97* 94*  CO2 26 27 23 29 28   GLUCOSE 153* 144* 145* 81 79  BUN 81* 89* 88* 54* 35*  CREATININE 4.79* 5.11* 5.05* 3.96* 4.34*  CALCIUM 8.5* 8.6* 8.5* 8.5* 8.7*  PHOS 5.7* 5.9* 6.6* 6.3* 5.8*   GFR: Estimated Creatinine Clearance: 11.3 mL/min (A) (by C-G formula based on SCr of 4.34 mg/dL (H)). Liver Function Tests:  Recent Labs Lab 02/09/17 0320 02/10/17 0352 02/10/17 1819 02/11/17 1100 02/13/17 0730  ALBUMIN 2.9* 2.9* 3.0* 2.6* 2.7*   No results for input(s): LIPASE, AMYLASE in the last 168 hours. No results for input(s): AMMONIA in the last 168 hours. Coagulation Profile: No results for input(s): INR, PROTIME in the last 168 hours. Cardiac Enzymes:  Recent Labs Lab 02/07/17 1600  TROPONINI  0.06*   BNP (last 3 results) No results for input(s): PROBNP in the last 8760 hours. HbA1C: No results for input(s): HGBA1C in the last 72 hours. CBG:  Recent Labs Lab 02/13/17 1153 02/13/17 1658 02/13/17 2209 02/14/17 0730 02/14/17 1149  GLUCAP 74 155* 126* 98 135*   Lipid Profile: No results for input(s): CHOL, HDL, LDLCALC, TRIG, CHOLHDL, LDLDIRECT in the last 72 hours. Thyroid Function Tests: No results for input(s): TSH, T4TOTAL, FREET4, T3FREE, THYROIDAB in the last 72 hours. Anemia Panel:  Recent Labs  02/14/17 1030  FERRITIN 101  TIBC 216*  IRON 30   Sepsis Labs: No results for input(s): PROCALCITON, LATICACIDVEN in the last 168 hours.  Recent Results (from the past 240 hour(s))  Culture, Urine     Status: Abnormal   Collection Time: 02/08/17  1:51 PM  Result Value Ref Range Status   Specimen Description URINE, RANDOM  Final   Special Requests NONE  Final   Culture >=100,000 COLONIES/mL ESCHERICHIA COLI (A)  Final   Report Status 02/11/2017 FINAL  Final   Organism ID, Bacteria ESCHERICHIA COLI (A)  Final      Susceptibility   Escherichia coli - MIC*    AMPICILLIN 16 INTERMEDIATE Intermediate     CEFAZOLIN <=4 SENSITIVE Sensitive     CEFTRIAXONE <=1 SENSITIVE Sensitive     CIPROFLOXACIN <=0.25 SENSITIVE Sensitive     GENTAMICIN <=1 SENSITIVE Sensitive     IMIPENEM <=0.25 SENSITIVE Sensitive     NITROFURANTOIN <=16 SENSITIVE Sensitive     TRIMETH/SULFA <=20 SENSITIVE Sensitive     AMPICILLIN/SULBACTAM <=2 SENSITIVE Sensitive     PIP/TAZO <=4 SENSITIVE Sensitive     Extended ESBL NEGATIVE Sensitive     * >=100,000 COLONIES/mL ESCHERICHIA COLI  Surgical PCR screen     Status: None   Collection Time: 02/10/17  5:28 AM  Result Value Ref Range Status   MRSA, PCR NEGATIVE NEGATIVE Final   Staphylococcus aureus NEGATIVE NEGATIVE Final    Comment:        The Xpert SA Assay (FDA approved for NASAL specimens in patients over 12 years of age), is one  component of a comprehensive surveillance program.  Test performance has been validated by [facility] for patients greater than or equal to 21 year old. It is not intended to diagnose infection nor to guide or monitor treatment.          Radiology Studies: No results found.      Scheduled Meds: . aspirin EC  81 mg Oral Daily  . brimonidine  1 drop Right Eye BID   And  . timolol  1 drop Right Eye BID  . calcitRIOL  0.5 mcg Oral Daily  . calcium acetate  667 mg Oral TID WC  . carvedilol  12.5 mg Oral BID WC  . cephALEXin  500 mg Oral Q12H  . darbepoetin (ARANESP) injection - DIALYSIS  60 mcg Intravenous Q Mon-HD  . heparin  5,000 Units Subcutaneous Q8H  . hydrALAZINE  50 mg Oral TID  . insulin aspart  0-9 Units Subcutaneous TID WC  . insulin detemir  15 Units Subcutaneous QHS  . isosorbide mononitrate  30 mg Oral Daily  . multivitamin  1 tablet Oral QHS  . polyethylene glycol  17 g Oral Daily   Continuous Infusions: . sodium chloride 10 mL/hr at 02/14/17 0607     LOS: 7 days    Time spent: 35 minutes.     Hosie Poisson, MD [facility] Pager [phone]  If 7PM-7AM, please contact night-coverage [email or web address] Password [email or web address] 02/14/2017, 1:36 PM

## 2017-02-14 NOTE — Progress Notes (Signed)
S:Eating some.  Up in chair.  Not very mobile O:BP (!) 168/54 (BP Location: Right Arm)   Pulse 84   Temp 98 F (36.7 C) (Oral)   Resp 17   Ht 5\' 3"  (1.6 m)   Wt 73.9 kg (163 lb)   SpO2 100%   BMI 28.87 kg/m   Intake/Output Summary (Last 24 hours) at 02/14/17 0732 Last data filed at 02/14/17 0644  Gross per 24 hour  Intake             1329 ml  Output             2450 ml  Net            -1121 ml   Weight change: -0.072 kg (-2.6 oz) DUK:GURKY and alert CVS:RRR Resp: + crackles Abd: +BS NTND Ext: no edema.  LUA AVF + bruit NEURO:CNI, Ox3, No asterixis Rt IJ perm cath   . aspirin EC  81 mg Oral Daily  . brimonidine  1 drop Right Eye BID   And  . timolol  1 drop Right Eye BID  . calcitRIOL  0.5 mcg Oral Daily  . calcium acetate  667 mg Oral TID WC  . carvedilol  12.5 mg Oral BID WC  . cephALEXin  500 mg Oral Q12H  . darbepoetin (ARANESP) injection - DIALYSIS  60 mcg Intravenous Q Mon-HD  . heparin  5,000 Units Subcutaneous Q8H  . hydrALAZINE  50 mg Oral TID  . insulin aspart  0-9 Units Subcutaneous TID WC  . insulin detemir  15 Units Subcutaneous QHS  . isosorbide mononitrate  30 mg Oral Daily  . polyethylene glycol  17 g Oral Daily   No results found. BMET    Component Value Date/Time   NA 130 (L) 02/13/2017 0730   K 4.3 02/13/2017 0730   CL 94 (L) 02/13/2017 0730   CO2 28 02/13/2017 0730   GLUCOSE 79 02/13/2017 0730   BUN 35 (H) 02/13/2017 0730   CREATININE 4.34 (H) 02/13/2017 0730   CALCIUM 8.7 (L) 02/13/2017 0730   GFRNONAA 9 (L) 02/13/2017 0730   GFRAA 11 (L) 02/13/2017 0730   CBC    Component Value Date/Time   WBC 5.6 02/13/2017 0730   RBC 3.35 (L) 02/13/2017 0730   HGB 8.9 (L) 02/13/2017 0730   HCT 30.4 (L) 02/13/2017 0730   PLT 249 02/13/2017 0730   MCV 90.7 02/13/2017 0730   MCH 26.6 02/13/2017 0730   MCHC 29.3 (L) 02/13/2017 0730   RDW 14.6 02/13/2017 0730   LYMPHSABS 0.8 08/10/2015 0234   MONOABS 0.6 08/10/2015 0234   EOSABS 0.3  08/10/2015 0234   BASOSABS 0.0 08/10/2015 0234     Assessment:  1. New ESRD sec DM/HTN and cardiorenal syndrome 2. Anemia on aranesp 3. HTN 4. Sec HPTH 5. Diastolic and systolic CHF   Plan: 1. Plan HD tomorrow 2. Check iron studies 3. Needs to improve strength before DC, may need rehab 4. DC lasix 5. Start renavite  Yudit Modesitt T

## 2017-02-15 DIAGNOSIS — N186 End stage renal disease: Secondary | ICD-10-CM | POA: Diagnosis present

## 2017-02-15 LAB — GLUCOSE, CAPILLARY
GLUCOSE-CAPILLARY: 217 mg/dL — AB (ref 65–99)
Glucose-Capillary: 205 mg/dL — ABNORMAL HIGH (ref 65–99)
Glucose-Capillary: 231 mg/dL — ABNORMAL HIGH (ref 65–99)
Glucose-Capillary: 99 mg/dL (ref 65–99)

## 2017-02-15 LAB — RENAL FUNCTION PANEL
ANION GAP: 9 (ref 5–15)
Albumin: 2.7 g/dL — ABNORMAL LOW (ref 3.5–5.0)
BUN: 24 mg/dL — ABNORMAL HIGH (ref 6–20)
CO2: 26 mmol/L (ref 22–32)
Calcium: 8.7 mg/dL — ABNORMAL LOW (ref 8.9–10.3)
Chloride: 95 mmol/L — ABNORMAL LOW (ref 101–111)
Creatinine, Ser: 4.61 mg/dL — ABNORMAL HIGH (ref 0.44–1.00)
GFR calc non Af Amer: 9 mL/min — ABNORMAL LOW (ref >60)
GFR, EST AFRICAN AMERICAN: 10 mL/min — AB (ref >60)
Glucose, Bld: 176 mg/dL — ABNORMAL HIGH (ref 65–99)
PHOSPHORUS: 5.5 mg/dL — AB (ref 2.5–4.6)
POTASSIUM: 4.5 mmol/L (ref 3.5–5.1)
Sodium: 130 mmol/L — ABNORMAL LOW (ref 135–145)

## 2017-02-15 LAB — CBC
HEMATOCRIT: 29.4 % — AB (ref 36.0–46.0)
HEMOGLOBIN: 8.8 g/dL — AB (ref 12.0–15.0)
MCH: 27.3 pg (ref 26.0–34.0)
MCHC: 29.9 g/dL — ABNORMAL LOW (ref 30.0–36.0)
MCV: 91.3 fL (ref 78.0–100.0)
Platelets: 292 10*3/uL (ref 150–400)
RBC: 3.22 MIL/uL — AB (ref 3.87–5.11)
RDW: 15 % (ref 11.5–15.5)
WBC: 7.1 10*3/uL (ref 4.0–10.5)

## 2017-02-15 MED ORDER — METHYLPREDNISOLONE ACETATE 40 MG/ML IJ SUSP
10.0000 mg | Freq: Once | INTRAMUSCULAR | Status: DC
  Filled 2017-02-15: qty 1

## 2017-02-15 MED ORDER — MORPHINE SULFATE (PF) 4 MG/ML IV SOLN
INTRAVENOUS | Status: AC
  Administered 2017-02-15: 2 mg
  Filled 2017-02-15: qty 1

## 2017-02-15 MED ORDER — HYDRALAZINE HCL 25 MG PO TABS
25.0000 mg | ORAL_TABLET | Freq: Three times a day (TID) | ORAL | Status: DC
  Administered 2017-02-15 – 2017-02-16 (×5): 25 mg via ORAL
  Filled 2017-02-15 (×5): qty 1

## 2017-02-15 MED ORDER — SODIUM CHLORIDE 0.9 % IV SOLN
510.0000 mg | INTRAVENOUS | Status: DC
  Administered 2017-02-15: 510 mg via INTRAVENOUS
  Filled 2017-02-15: qty 17

## 2017-02-15 MED ORDER — HEPARIN SODIUM (PORCINE) 1000 UNIT/ML DIALYSIS: 20.0000 [IU]/kg | INTRAMUSCULAR | Status: DC | PRN

## 2017-02-15 MED ORDER — BUPIVACAINE HCL (PF) 0.5 % IJ SOLN
10.0000 mL | Freq: Once | INTRAMUSCULAR | Status: DC
  Filled 2017-02-15: qty 10

## 2017-02-15 MED ORDER — LIDOCAINE HCL 1 % IJ SOLN
10.0000 mL | Freq: Once | INTRAMUSCULAR | Status: DC
  Filled 2017-02-15: qty 10

## 2017-02-15 NOTE — Progress Notes (Signed)
PROGRESS NOTE    Kathleen [name]  WVP:[id number] DOB: 02/24/1946 DOA: 02/06/2017 PCP: Benito Mccreedy, MD    Brief Narrative: 71 year old lady with prior h/o stage 4 to 5  CKD, systolic and diastolic CHF, dm, hypertension, hyperlipidemia, CAD, anemia of chronic disease, diabetes Mellitus,  comes in for doe and LE edema. She was initially started on IV lasix, without much diuresis, instead her creatinine started worsening. Nephrology consulted, recommended initiating HD and vascular surgery consulted vein mapping, placement of HD catheter and AVF/ AVG.  She is undergoing HD and outpatient HD to be set up , clip in process.   Assessment & Plan:   Principal Problem:   Acute on chronic combined systolic and diastolic congestive heart failure (HCC) Active Problems:   Hypertensive urgency   CKD stage 4 due to type 2 diabetes mellitus (HCC)   CHF (congestive heart failure) (HCC)   Acute on chronic systolic and diastolic heart failure:  Started on lasix 80 mg BID increased to Lasix 120 mg BID gtt, not much diuresis.  Repeat ECHO done ,showed LVEF of 45%, with diffuse hypokinesis, and grade 1 diastolic dysfunction. Nephrology consulted and recommended initiating HD.  Vascular surgery consulted on 7/5 , underwent vein mapping.   She had right IJ  HD catheter placed in addition to a left brachial cephalic AV fistula for HD on 7/6.  Vascular recommended follow up with with Dr Oneida Alar in 6 weeks in the office.  HD initiated on 7/6, 7/7, 7/9, 7/11 Further fluid management with HD.  Lasix discontinued by nephrology.  Currently waiting for outpatient HD to be set up.    Acute on Stage 4 CKD:  ? Cardio renal syndrome.  Worsening renal parameters on IV lasix, nephrology consulted,,  Underwent placement of HD cath and left UE AV fistula.  HD started and her breathing has improved.  She will need outpatient HD set up . Clip in process.   Hyperkalemia, hyponatremia, worsening renal parameters.    Management with HD.   Hypertensive urgency.  Improved .   Mild normocytic anemia: anemia of chronic disease.  Hemoglobin dropped from 10 to 8.8, she will need aranesp with HD.    Diabetes Mellitus:  CBG (last 3)   Recent Labs  02/14/17 1959 02/15/17 0004 02/15/17 1114  GLUCAP 146* 231* 99    [facility]SI at [facility]  Resume levemir at 15 units daily.    Protein energy malnutrition Nutrition:  Renal diet and supplementation.    Hyperlipidemia: not on statin.  CAD; No chest pain.  Resume aspirin coreg.    Elevated troponins:  Possibly from demand ischemia from HF.  No chest pain.    E coli UTI:  Start keflex 500 mg bid, complete 7 day course.    Bilateral Knee pain;  Suspect from arthritis X rays ordered to evaluate for effusion. X rays does not show any effusion, requested orthopedics to see if  She would benefit from intraarticular steroid injection.    DVT prophylaxis: sq heparin.  Code Status: DNR.  Family Communication: NONE AT BEDSIDE.  Disposition Plan: snf when outpatient hd is set up.    Consultants:   Nephrology consult.   Vascular consult  Orthopedics for intraarticular steroid injection.    Procedures:   Echocardiogram  Vein mapping  Right IJ  HD catheter placed in addition to a left brachial cephalic AV fistula for HD on 7/6.    Antimicrobials: keflex for UTI.    Subjective: Lethargic, just came from  HD,  Reports some pain in the knees, No chest pain or sob Still on 2 lit of [place] oxygen.    Objective: Vitals:   02/15/17 0930 02/15/17 1000 02/15/17 1028 02/15/17 1035  BP: (!) 119/54 (!) 112/50 (!) 111/54 121/62  Pulse: 72 71 77 74  Resp: 16 18 18 17   Temp:    (!) 97.2 F (36.2 C)  TempSrc:    Oral  SpO2:      Weight:    79.5 kg (175 lb 4.3 oz)  Height:        Intake/Output Summary (Last 24 hours) at 02/15/17 1150 Last data filed at 02/15/17 1035  Gross per 24 hour  Intake           492.67 ml  Output              3603 ml  Net         -3110.33 ml   Filed Weights   02/15/17 0335 02/15/17 0702 02/15/17 1035  Weight: 81.1 kg (178 lb 14.4 oz) 81.9 kg (180 lb 9.6 oz) 79.5 kg (175 lb 4.3 oz)    Examination:  General exam: alert and comfortable, in bed on 2 lit of [place] oxygen.  Respiratory system: clear, air entry fair.  no wheezing or rhonchi.  Cardiovascular system: S1 & S2 heard, RRR. No JVD, murmurs, improving pedal edema.  Gastrointestinal system: Abdomen is soft non tender non distended bowel sounds heard.  Central nervous system: Alert and oriented. No focal neurological deficits. Extremities: leg edema improved. Leg wound bandaged. bialteral knee tenderness on the lateral aspect.  Skin: No rashes, lesions or ulcers Psychiatry: Mood & affect appropriate.     Data Reviewed: I have personally reviewed following labs and imaging studies  CBC:  Recent Labs Lab 02/10/17 0352 02/10/17 1819 02/11/17 1100 02/13/17 0730 02/15/17 0417  WBC 3.9* 5.4 4.1 5.6 7.1  HGB 10.2* 10.7* 9.4* 8.9* 8.8*  HCT 33.5* 35.3* 31.8* 30.4* 29.4*  MCV 86.8 87.8 89.6 90.7 91.3  PLT 291 295 272 249 950   Basic Metabolic Panel:  Recent Labs Lab 02/10/17 0352 02/10/17 1819 02/11/17 1100 02/13/17 0730 02/15/17 0417  NA 133* 134* 134* 130* 130*  K 5.5* 5.6* 4.9 4.3 4.5  CL 96* 98* 97* 94* 95*  CO2 27 23 29 28 26   GLUCOSE 144* 145* 81 79 176*  BUN 89* 88* 54* 35* 24*  CREATININE 5.11* 5.05* 3.96* 4.34* 4.61*  CALCIUM 8.6* 8.5* 8.5* 8.7* 8.7*  PHOS 5.9* 6.6* 6.3* 5.8* 5.5*   GFR: Estimated Creatinine Clearance: 11 mL/min (A) (by C-G formula based on SCr of 4.61 mg/dL (H)). Liver Function Tests:  Recent Labs Lab 02/10/17 0352 02/10/17 1819 02/11/17 1100 02/13/17 0730 02/15/17 0417  ALBUMIN 2.9* 3.0* 2.6* 2.7* 2.7*   No results for input(s): LIPASE, AMYLASE in the last 168 hours. No results for input(s): AMMONIA in the last 168 hours. Coagulation Profile: No results for input(s): INR, PROTIME  in the last 168 hours. Cardiac Enzymes: No results for input(s): CKTOTAL, CKMB, CKMBINDEX, TROPONINI in the last 168 hours. BNP (last 3 results) No results for input(s): PROBNP in the last 8760 hours. HbA1C: No results for input(s): HGBA1C in the last 72 hours. CBG:  Recent Labs Lab 02/14/17 1149 02/14/17 1541 02/14/17 1959 02/15/17 0004 02/15/17 1114  GLUCAP 135* 129* 146* 231* 99   Lipid Profile: No results for input(s): CHOL, HDL, LDLCALC, TRIG, CHOLHDL, LDLDIRECT in the last 72 hours. Thyroid Function Tests: No results  for input(s): TSH, T4TOTAL, FREET4, T3FREE, THYROIDAB in the last 72 hours. Anemia Panel:  Recent Labs  02/14/17 1030  FERRITIN 101  TIBC 216*  IRON 30   Sepsis Labs: No results for input(s): PROCALCITON, LATICACIDVEN in the last 168 hours.  Recent Results (from the past 240 hour(s))  Culture, Urine     Status: Abnormal   Collection Time: 02/08/17  1:51 PM  Result Value Ref Range Status   Specimen Description URINE, RANDOM  Final   Special Requests NONE  Final   Culture >=100,000 COLONIES/mL ESCHERICHIA COLI (A)  Final   Report Status 02/11/2017 FINAL  Final   Organism ID, Bacteria ESCHERICHIA COLI (A)  Final      Susceptibility   Escherichia coli - MIC*    AMPICILLIN 16 INTERMEDIATE Intermediate     CEFAZOLIN <=4 SENSITIVE Sensitive     CEFTRIAXONE <=1 SENSITIVE Sensitive     CIPROFLOXACIN <=0.25 SENSITIVE Sensitive     GENTAMICIN <=1 SENSITIVE Sensitive     IMIPENEM <=0.25 SENSITIVE Sensitive     NITROFURANTOIN <=16 SENSITIVE Sensitive     TRIMETH/SULFA <=20 SENSITIVE Sensitive     AMPICILLIN/SULBACTAM <=2 SENSITIVE Sensitive     PIP/TAZO <=4 SENSITIVE Sensitive     Extended ESBL NEGATIVE Sensitive     * >=100,000 COLONIES/mL ESCHERICHIA COLI  Surgical PCR screen     Status: None   Collection Time: 02/10/17  5:28 AM  Result Value Ref Range Status   MRSA, PCR NEGATIVE NEGATIVE Final   Staphylococcus aureus NEGATIVE NEGATIVE Final     Comment:        The Xpert SA Assay (FDA approved for NASAL specimens in patients over 53 years of age), is one component of a comprehensive surveillance program.  Test performance has been validated by [facility] for patients greater than or equal to 81 year old. It is not intended to diagnose infection nor to guide or monitor treatment.          Radiology Studies: Dg Knee Complete 4 Views Left  Result Date: 02/14/2017 CLINICAL DATA:  Bilateral knee pain for 2 months, worsening EXAM: LEFT KNEE - COMPLETE 4+ VIEW COMPARISON:  06/10/2014 FINDINGS: Tricompartmental osteoarthritis with advanced lateral compartment narrowing and degenerative spurring. Prominent patellofemoral compartment spurring as well. Sclerosis below the anterior tibial plateau is chronic and stable from prior, likely subchondral cyst and reactive sclerosis. Atherosclerosis and nonspecific soft tissue reticulation. Osteopenia. IMPRESSION: 1. No acute finding. 2. Tricompartmental osteoarthritis, particularly advanced in the lateral compartment. Electronically Signed   By: Monte Fantasia M.D.   On: 02/14/2017 16:38   Dg Knee Complete 4 Views Right  Result Date: 02/14/2017 CLINICAL DATA:  Knee pain, bilateral for 2 months EXAM: RIGHT KNEE - COMPLETE 4+ VIEW COMPARISON:  None. FINDINGS: Tricompartmental osteoarthritis with marginal spurring greatest at the lateral compartment. Possible small joint effusion. No acute fracture or malalignment. IMPRESSION: 1. No acute osseous finding. 2. Osteoarthritis, advanced at the lateral compartment. 3. Osteopenia. Electronically Signed   By: Monte Fantasia M.D.   On: 02/14/2017 16:51        Scheduled Meds: . aspirin EC  81 mg Oral Daily  . brimonidine  1 drop Right Eye BID   And  . timolol  1 drop Right Eye BID  . calcitRIOL  0.5 mcg Oral Daily  . calcium acetate  667 mg Oral TID WC  . carvedilol  12.5 mg Oral BID WC  . cephALEXin  500 mg Oral Q12H  . darbepoetin (ARANESP)  injection - DIALYSIS  60 mcg Intravenous Q Mon-HD  . heparin  5,000 Units Subcutaneous Q8H  . hydrALAZINE  25 mg Oral TID  . insulin aspart  0-9 Units Subcutaneous TID WC  . insulin detemir  15 Units Subcutaneous QHS  . isosorbide mononitrate  30 mg Oral Daily  . multivitamin  1 tablet Oral QHS  . polyethylene glycol  17 g Oral Daily   Continuous Infusions: . sodium chloride Stopped (02/15/17 0049)  . ferumoxytol       LOS: 8 days    Time spent: 35 minutes.     Hosie Poisson, MD [facility] Pager [phone]  If 7PM-7AM, please contact night-coverage [email or web address] Password [email or web address] 02/15/2017, 11:50 AM

## 2017-02-15 NOTE — Progress Notes (Signed)
Patient has no concerns or complaints, patient has been more awake and alert.

## 2017-02-15 NOTE — Progress Notes (Signed)
S: CO knee pain.  Pt seen on HD  Ap 200 Vp 160  BFR 400 O:BP (!) 116/50   Pulse 71   Temp 98.2 F (36.8 C) (Oral)   Resp 16   Ht 5\' 3"  (1.6 m)   Wt 81.9 kg (180 lb 9.6 oz)   SpO2 98%   BMI 31.99 kg/m   Intake/Output Summary (Last 24 hours) at 02/15/17 0937 Last data filed at 02/15/17 0600  Gross per 24 hour  Intake           492.67 ml  Output             1100 ml  Net          -607.33 ml   Weight change: -1.651 kg (-3 lb 10.3 oz) VZD:GLOVF and alert CVS:RRR Resp: + crackles Abd: +BS NTND Ext: no edema.  LUA AVF + bruit NEURO:CNI, Ox3, No asterixis Rt IJ perm cath   . aspirin EC  81 mg Oral Daily  . brimonidine  1 drop Right Eye BID   And  . timolol  1 drop Right Eye BID  . calcitRIOL  0.5 mcg Oral Daily  . calcium acetate  667 mg Oral TID WC  . carvedilol  12.5 mg Oral BID WC  . cephALEXin  500 mg Oral Q12H  . darbepoetin (ARANESP) injection - DIALYSIS  60 mcg Intravenous Q Mon-HD  . heparin  5,000 Units Subcutaneous Q8H  . hydrALAZINE  50 mg Oral TID  . insulin aspart  0-9 Units Subcutaneous TID WC  . insulin detemir  15 Units Subcutaneous QHS  . isosorbide mononitrate  30 mg Oral Daily  . multivitamin  1 tablet Oral QHS  . polyethylene glycol  17 g Oral Daily   Dg Knee Complete 4 Views Left  Result Date: 02/14/2017 CLINICAL DATA:  Bilateral knee pain for 2 months, worsening EXAM: LEFT KNEE - COMPLETE 4+ VIEW COMPARISON:  06/10/2014 FINDINGS: Tricompartmental osteoarthritis with advanced lateral compartment narrowing and degenerative spurring. Prominent patellofemoral compartment spurring as well. Sclerosis below the anterior tibial plateau is chronic and stable from prior, likely subchondral cyst and reactive sclerosis. Atherosclerosis and nonspecific soft tissue reticulation. Osteopenia. IMPRESSION: 1. No acute finding. 2. Tricompartmental osteoarthritis, particularly advanced in the lateral compartment. Electronically Signed   By: Monte Fantasia M.D.   On:  02/14/2017 16:38   Dg Knee Complete 4 Views Right  Result Date: 02/14/2017 CLINICAL DATA:  Knee pain, bilateral for 2 months EXAM: RIGHT KNEE - COMPLETE 4+ VIEW COMPARISON:  None. FINDINGS: Tricompartmental osteoarthritis with marginal spurring greatest at the lateral compartment. Possible small joint effusion. No acute fracture or malalignment. IMPRESSION: 1. No acute osseous finding. 2. Osteoarthritis, advanced at the lateral compartment. 3. Osteopenia. Electronically Signed   By: Monte Fantasia M.D.   On: 02/14/2017 16:51   BMET    Component Value Date/Time   NA 130 (L) 02/15/2017 0417   K 4.5 02/15/2017 0417   CL 95 (L) 02/15/2017 0417   CO2 26 02/15/2017 0417   GLUCOSE 176 (H) 02/15/2017 0417   BUN 24 (H) 02/15/2017 0417   CREATININE 4.61 (H) 02/15/2017 0417   CALCIUM 8.7 (L) 02/15/2017 0417   GFRNONAA 9 (L) 02/15/2017 0417   GFRAA 10 (L) 02/15/2017 0417   CBC    Component Value Date/Time   WBC 7.1 02/15/2017 0417   RBC 3.22 (L) 02/15/2017 0417   HGB 8.8 (L) 02/15/2017 0417   HCT 29.4 (L) 02/15/2017 0417   PLT  292 02/15/2017 0417   MCV 91.3 02/15/2017 0417   MCH 27.3 02/15/2017 0417   MCHC 29.9 (L) 02/15/2017 0417   RDW 15.0 02/15/2017 0417   LYMPHSABS 0.8 08/10/2015 0234   MONOABS 0.6 08/10/2015 0234   EOSABS 0.3 08/10/2015 0234   BASOSABS 0.0 08/10/2015 0234     Assessment:  1. New ESRD sec DM/HTN and cardiorenal syndrome 2. Anemia on aranesp 3. HTN 4. Sec HPTH on calcitriol 5. Diastolic and systolic CHF 6. Osteoarthritis of knees  Plan: 1. HD today 2. Awaiting PTH 3. BP getting low with HD, will decrease hydralazine dose Mikka Kissner T

## 2017-02-16 DIAGNOSIS — E785 Hyperlipidemia, unspecified: Secondary | ICD-10-CM

## 2017-02-16 DIAGNOSIS — I5043 Acute on chronic combined systolic (congestive) and diastolic (congestive) heart failure: Secondary | ICD-10-CM

## 2017-02-16 DIAGNOSIS — N189 Chronic kidney disease, unspecified: Secondary | ICD-10-CM

## 2017-02-16 DIAGNOSIS — E119 Type 2 diabetes mellitus without complications: Secondary | ICD-10-CM

## 2017-02-16 DIAGNOSIS — M25569 Pain in unspecified knee: Secondary | ICD-10-CM

## 2017-02-16 DIAGNOSIS — Z794 Long term (current) use of insulin: Secondary | ICD-10-CM

## 2017-02-16 DIAGNOSIS — D631 Anemia in chronic kidney disease: Secondary | ICD-10-CM

## 2017-02-16 DIAGNOSIS — N179 Acute kidney failure, unspecified: Secondary | ICD-10-CM

## 2017-02-16 DIAGNOSIS — I10 Essential (primary) hypertension: Secondary | ICD-10-CM

## 2017-02-16 DIAGNOSIS — N186 End stage renal disease: Secondary | ICD-10-CM

## 2017-02-16 DIAGNOSIS — R748 Abnormal levels of other serum enzymes: Secondary | ICD-10-CM

## 2017-02-16 LAB — CBC
HCT: 32.4 % — ABNORMAL LOW (ref 36.0–46.0)
Hemoglobin: 9.4 g/dL — ABNORMAL LOW (ref 12.0–15.0)
MCH: 26.8 pg (ref 26.0–34.0)
MCHC: 29 g/dL — AB (ref 30.0–36.0)
MCV: 92.3 fL (ref 78.0–100.0)
PLATELETS: 243 10*3/uL (ref 150–400)
RBC: 3.51 MIL/uL — ABNORMAL LOW (ref 3.87–5.11)
RDW: 14.8 % (ref 11.5–15.5)
WBC: 6.5 10*3/uL (ref 4.0–10.5)

## 2017-02-16 LAB — BASIC METABOLIC PANEL
ANION GAP: 10 (ref 5–15)
BUN: 17 mg/dL (ref 6–20)
CALCIUM: 8.8 mg/dL — AB (ref 8.9–10.3)
CO2: 26 mmol/L (ref 22–32)
CREATININE: 3.96 mg/dL — AB (ref 0.44–1.00)
Chloride: 95 mmol/L — ABNORMAL LOW (ref 101–111)
GFR calc Af Amer: 12 mL/min — ABNORMAL LOW (ref >60)
GFR, EST NON AFRICAN AMERICAN: 10 mL/min — AB (ref >60)
GLUCOSE: 198 mg/dL — AB (ref 65–99)
Potassium: 4.5 mmol/L (ref 3.5–5.1)
Sodium: 131 mmol/L — ABNORMAL LOW (ref 135–145)

## 2017-02-16 LAB — GLUCOSE, CAPILLARY
Glucose-Capillary: 205 mg/dL — ABNORMAL HIGH (ref 65–99)
Glucose-Capillary: 250 mg/dL — ABNORMAL HIGH (ref 65–99)
Glucose-Capillary: 268 mg/dL — ABNORMAL HIGH (ref 65–99)
Glucose-Capillary: 216 mg/dL — ABNORMAL HIGH (ref 65–99)

## 2017-02-16 NOTE — Procedures (Signed)
Procedure: Right knee aspiration and injection  Indication: Right knee effusion(s)  Surgeon: Silvestre Gunner, PA-C  Assist: None  Anesthesia: None  EBL: None  Complications: None  Findings: After risks/benefits explained patient desires to undergo procedure. Consent obtained and time out performed. Right knee was sterilely prepped and aspirated. Anteromedial approach was attempted but failed to produce synovial fluid. Approach was changed to superolateral and pt was reprepped. Clear synovial fluid aspirated and a mixture of Lidocaine, Marcaine, and Depo-Medrol was infiltrated into joint. Pt tolerated the procedure well.    Lisette Abu, PA-C Orthopedic Surgery [phone]

## 2017-02-16 NOTE — Progress Notes (Signed)
Attempted to wean patient to room air, however oxygen saturation 86%. Patient oxygen saturation up to 93% on 1L oxygen.  Will continue to monitor.

## 2017-02-16 NOTE — Progress Notes (Signed)
PROGRESS NOTE    Kathleen [name]  POE:[id number] DOB: 05-09-1946 DOA: 02/06/2017 PCP: Benito Mccreedy, MD   Chief Complaint  Patient presents with  . Shortness of Breath  . Weakness    Brief Narrative:  HPI On 02/07/2017 by Dr. Gean Birchwood Kathleen [name] is a 71 y.o. female with history of chronic kidney disease stage IV to 5, chronic combined systolic and diastolic CHF posterior fourchette was 40-45% with grade 1 diastolic dysfunction in December 2016 1 diabetes mellitus, anemia presents to the ER with increasing shortness of breath and lower extremity edema. Patient states 2 weeks ago patient's nephrologist increased her Lasix dose from once a day to 3 times a day. Patient was unable to take the dose because she was unable to ambulate for frequent urination. She was only taking twice a day. Patient's shortness of breath is mostly on lying down. Denies any chest pain or productive cough.  Interim history Patient is to start on IV Lasix without good diuresis. Creatinine was worsening. Nephrology was consulted and initiated hemodialysis. Vascular surgery also consulted with placement of HD catheter. Pending clip process and improvement in physical deconditioning prior to discharge. Assessment & Plan   Acute on chronic combined systolic and diastolic heart failure -Chest x-ray on admission showed mild CHF -BNP 463.4 -Echocardiogram showed EF of 45%, diffuse hypokinesis, grade 1 diastolic dysfunction -Patient was placed on IV Lasix, 120 mg twice a day however with little diuresis. Creatinine began to worsen. -Nephrology consult appreciated, initiated hemodialysis -Vascular surgery consulted, patient had vein mapping. AV fistula placed on 02/10/2017 (will need follow-up with Dr. Oneida Alar in 6 weeks) -Continue volume control with HD   New ESRD/Acute kidney kidney injury and CAD stage IV -? Cardiorenal syndrome -As stated above, patient was on diuresis however creatinine worsened and HD  initiated -Creatinine peaked at 5.05 -Patient will need to dialyze in a chair on 02/17/2017 -Patient does have outpatient dialysis slot  Hyperkalemia/hyponatremia -Managed with HD  Hypertensive urgency -Resolved  Anemia of chronic disease -Hemoglobin currently 9.4 -Continue Aranesp  Diabetes mellitus, type II -Continue Levemir, insulin sliding scale and CBG monitoring  Malnutrition -Nutrition consulted and recommended snacks between meals  Coronary artery disease -No complaints of chest pain, continue aspirin Coreg  Elevated troponins -Likely secondary to demand ischemia from heart failure  Escherichia coli UTI -Continue Keflex  Bilateral knee pain -Suspect is secondary to arthritis -X-rays obtained, negative for effusion -Orthopedics consulted and appreciated, status post right knee aspiration and injection with mixture of lidocaine, Marcaine, Depo-Medrol -PT consulted recommended SNF  DVT Prophylaxis  heparin  Code Status: DO NOT RESUSCITATE   Family Communication: Not at bedside   Disposition Plan: Admitted. D/C to SNF when patient can sit and transfer to dialysis.   Consultants Nephrology Vascular surgery Orthopedics   Procedures  Echocardiogram Vein mapping Right IJ HD catheter placed Left brachiocephalic AV fistula Right knee aspiration and injection  Antibiotics   Anti-infectives    Start     Dose/Rate Route Frequency Ordered Stop   02/11/17 2200  cephALEXin (KEFLEX) capsule 500 mg     500 mg Oral Every 12 hours 02/11/17 1624     02/10/17 1245  ceFAZolin (ANCEF) IVPB 2g/100 mL premix     2 g 200 mL/hr over 30 Minutes Intravenous  Once 02/10/17 1231 02/10/17 1342   02/10/17 1232  ceFAZolin (ANCEF) 2-4 GM/100ML-% IVPB    Comments:  Henrine Screws   : cabinet override      02/10/17 1232  02/10/17 1312   02/10/17 0500  ceFAZolin (ANCEF) IVPB 1 g/50 mL premix  Status:  Discontinued    Comments:  Send with pt to OR   1 g 100 mL/hr over 30 Minutes  Intravenous On call 02/09/17 1441 02/10/17 1231      Subjective:   Kathleen [name] seen and examined today.  Patient states she is feeling better today. States she has not gotten out of bed much. Denies any chest pain, shortness of breath, abdominal pain, nausea vomiting, diarrhea or constipation. Feels her knee pain is improved.   Objective:   Vitals:   02/15/17 1959 02/16/17 0632 02/16/17 0753 02/16/17 1134  BP: (!) 110/37 (!) 159/64 (!) 109/44 (!) 98/48  Pulse: 70 81 74 76  Resp: 17 18    Temp: 99.2 F (37.3 C) 98.8 F (37.1 C) 98.1 F (36.7 C) 98.3 F (36.8 C)  TempSrc: Oral Oral Oral Axillary  SpO2: 98% 100% 99% 100%  Weight:  79.2 kg (174 lb 9.6 oz)    Height:        Intake/Output Summary (Last 24 hours) at 02/16/17 1322 Last data filed at 02/16/17 0900  Gross per 24 hour  Intake              660 ml  Output                0 ml  Net              660 ml   Filed Weights   02/15/17 0702 02/15/17 1035 02/16/17 3382  Weight: 81.9 kg (180 lb 9.6 oz) 79.5 kg (175 lb 4.3 oz) 79.2 kg (174 lb 9.6 oz)    Exam  General: Well developed, well nourished, NAD, appears stated age  HEENT: NCAT, mucous membranes moist.   Cardiovascular: S1 S2 auscultated, 2/6 SEM, RRR  Respiratory:Clear to auscultation, equal chest rise   Abdomen: Soft, nontender, nondistended, + bowel sounds  Extremities: warm dry without cyanosis clubbing or edema, LUE AVF  Neuro: AAOx3,nonfocal   Psych: Normal affect and demeanor with intact judgement and insight   Data Reviewed: I have personally reviewed following labs and imaging studies  CBC:  Recent Labs Lab 02/10/17 1819 02/11/17 1100 02/13/17 0730 02/15/17 0417 02/16/17 0730  WBC 5.4 4.1 5.6 7.1 6.5  HGB 10.7* 9.4* 8.9* 8.8* 9.4*  HCT 35.3* 31.8* 30.4* 29.4* 32.4*  MCV 87.8 89.6 90.7 91.3 92.3  PLT 295 272 249 292 505   Basic Metabolic Panel:  Recent Labs Lab 02/10/17 0352 02/10/17 1819 02/11/17 1100 02/13/17 0730  02/15/17 0417 02/16/17 0730  NA 133* 134* 134* 130* 130* 131*  K 5.5* 5.6* 4.9 4.3 4.5 4.5  CL 96* 98* 97* 94* 95* 95*  CO2 27 23 29 28 26 26   GLUCOSE 144* 145* 81 79 176* 198*  BUN 89* 88* 54* 35* 24* 17  CREATININE 5.11* 5.05* 3.96* 4.34* 4.61* 3.96*  CALCIUM 8.6* 8.5* 8.5* 8.7* 8.7* 8.8*  PHOS 5.9* 6.6* 6.3* 5.8* 5.5*  --    GFR: Estimated Creatinine Clearance: 12.8 mL/min (A) (by C-G formula based on SCr of 3.96 mg/dL (H)). Liver Function Tests:  Recent Labs Lab 02/10/17 0352 02/10/17 1819 02/11/17 1100 02/13/17 0730 02/15/17 0417  ALBUMIN 2.9* 3.0* 2.6* 2.7* 2.7*   No results for input(s): LIPASE, AMYLASE in the last 168 hours. No results for input(s): AMMONIA in the last 168 hours. Coagulation Profile: No results for input(s): INR, PROTIME in the last 168 hours. Cardiac Enzymes:  No results for input(s): CKTOTAL, CKMB, CKMBINDEX, TROPONINI in the last 168 hours. BNP (last 3 results) No results for input(s): PROBNP in the last 8760 hours. HbA1C: No results for input(s): HGBA1C in the last 72 hours. CBG:  Recent Labs Lab 02/15/17 1114 02/15/17 1608 02/15/17 2129 02/16/17 0744 02/16/17 1131  GLUCAP 99 205* 217* 205* 250*   Lipid Profile: No results for input(s): CHOL, HDL, LDLCALC, TRIG, CHOLHDL, LDLDIRECT in the last 72 hours. Thyroid Function Tests: No results for input(s): TSH, T4TOTAL, FREET4, T3FREE, THYROIDAB in the last 72 hours. Anemia Panel:  Recent Labs  02/14/17 1030  FERRITIN 101  TIBC 216*  IRON 30   Urine analysis:    Component Value Date/Time   COLORURINE YELLOW 02/08/2017 1351   APPEARANCEUR HAZY (A) 02/08/2017 1351   LABSPEC 1.009 02/08/2017 1351   PHURINE 5.0 02/08/2017 1351   GLUCOSEU NEGATIVE 02/08/2017 1351   HGBUR NEGATIVE 02/08/2017 1351   BILIRUBINUR NEGATIVE 02/08/2017 1351   KETONESUR NEGATIVE 02/08/2017 1351   PROTEINUR 100 (A) 02/08/2017 1351   UROBILINOGEN 0.2 06/11/2014 0639   NITRITE POSITIVE (A) 02/08/2017 1351    LEUKOCYTESUR LARGE (A) 02/08/2017 1351   Sepsis Labs: @LABRCNTIP (procalcitonin:4,lacticidven:4)  ) Recent Results (from the past 240 hour(s))  Culture, Urine     Status: Abnormal   Collection Time: 02/08/17  1:51 PM  Result Value Ref Range Status   Specimen Description URINE, RANDOM  Final   Special Requests NONE  Final   Culture >=100,000 COLONIES/mL ESCHERICHIA COLI (A)  Final   Report Status 02/11/2017 FINAL  Final   Organism ID, Bacteria ESCHERICHIA COLI (A)  Final      Susceptibility   Escherichia coli - MIC*    AMPICILLIN 16 INTERMEDIATE Intermediate     CEFAZOLIN <=4 SENSITIVE Sensitive     CEFTRIAXONE <=1 SENSITIVE Sensitive     CIPROFLOXACIN <=0.25 SENSITIVE Sensitive     GENTAMICIN <=1 SENSITIVE Sensitive     IMIPENEM <=0.25 SENSITIVE Sensitive     NITROFURANTOIN <=16 SENSITIVE Sensitive     TRIMETH/SULFA <=20 SENSITIVE Sensitive     AMPICILLIN/SULBACTAM <=2 SENSITIVE Sensitive     PIP/TAZO <=4 SENSITIVE Sensitive     Extended ESBL NEGATIVE Sensitive     * >=100,000 COLONIES/mL ESCHERICHIA COLI  Surgical PCR screen     Status: None   Collection Time: 02/10/17  5:28 AM  Result Value Ref Range Status   MRSA, PCR NEGATIVE NEGATIVE Final   Staphylococcus aureus NEGATIVE NEGATIVE Final    Comment:        The Xpert SA Assay (FDA approved for NASAL specimens in patients over 65 years of age), is one component of a comprehensive surveillance program.  Test performance has been validated by [facility] for patients greater than or equal to 43 year old. It is not intended to diagnose infection nor to guide or monitor treatment.       Radiology Studies: Dg Knee Complete 4 Views Left  Result Date: 02/14/2017 CLINICAL DATA:  Bilateral knee pain for 2 months, worsening EXAM: LEFT KNEE - COMPLETE 4+ VIEW COMPARISON:  06/10/2014 FINDINGS: Tricompartmental osteoarthritis with advanced lateral compartment narrowing and degenerative spurring. Prominent patellofemoral  compartment spurring as well. Sclerosis below the anterior tibial plateau is chronic and stable from prior, likely subchondral cyst and reactive sclerosis. Atherosclerosis and nonspecific soft tissue reticulation. Osteopenia. IMPRESSION: 1. No acute finding. 2. Tricompartmental osteoarthritis, particularly advanced in the lateral compartment. Electronically Signed   By: Monte Fantasia M.D.   On:  02/14/2017 16:38   Dg Knee Complete 4 Views Right  Result Date: 02/14/2017 CLINICAL DATA:  Knee pain, bilateral for 2 months EXAM: RIGHT KNEE - COMPLETE 4+ VIEW COMPARISON:  None. FINDINGS: Tricompartmental osteoarthritis with marginal spurring greatest at the lateral compartment. Possible small joint effusion. No acute fracture or malalignment. IMPRESSION: 1. No acute osseous finding. 2. Osteoarthritis, advanced at the lateral compartment. 3. Osteopenia. Electronically Signed   By: Monte Fantasia M.D.   On: 02/14/2017 16:51     Scheduled Meds: . aspirin EC  81 mg Oral Daily  . brimonidine  1 drop Right Eye BID   And  . timolol  1 drop Right Eye BID  . bupivacaine  10 mL Infiltration Once  . calcitRIOL  0.5 mcg Oral Daily  . calcium acetate  667 mg Oral TID WC  . carvedilol  12.5 mg Oral BID WC  . cephALEXin  500 mg Oral Q12H  . darbepoetin (ARANESP) injection - DIALYSIS  60 mcg Intravenous Q Mon-HD  . heparin  5,000 Units Subcutaneous Q8H  . hydrALAZINE  25 mg Oral TID  . insulin aspart  0-9 Units Subcutaneous TID WC  . insulin detemir  15 Units Subcutaneous QHS  . isosorbide mononitrate  30 mg Oral Daily  . lidocaine  10 mL Infiltration Once  . methylPREDNISolone acetate  10 mg Intra-articular Once  . multivitamin  1 tablet Oral QHS  . polyethylene glycol  17 g Oral Daily   Continuous Infusions: . sodium chloride Stopped (02/15/17 0049)  . ferumoxytol Stopped (02/15/17 1221)     LOS: 9 days   Time Spent in minutes   30 minutes  Alvena Kiernan D.O. on 02/16/2017 at 1:22 PM  Between  7am to 7pm - Pager - [phone]  After 7pm go to [email or web address] - password [email or web address]  And look for the night coverage person covering for me after hours  [facility] Office  [phone]

## 2017-02-16 NOTE — Progress Notes (Signed)
Physical Therapy Treatment Patient Details Name: Kathleen [name] MRN: [id number] DOB: 12-04-1945 Today's Date: 02/16/2017    History of Present Illness 71 yo admitted with SOb and bil LE edema, CHF. PMHx: CKD, CAD, HTN, CHF, DM.  Found to have CKD stage V and now s/p L UE braciocephaic fistula on 7/6 and initiated HD via R IJ catheter 7/6.    PT Comments    Patient tolerated short gait distance (less than 51ft) this session. Pt required min/mod A +2 for sit to stand transfers. Pt reported bilat knee pain improved from yesterday. Current plan remains appropriate.    Follow Up Recommendations  SNF;Supervision/Assistance - 24 hour     Equipment Recommendations  None recommended by PT    Recommendations for Other Services       Precautions / Restrictions Precautions Precautions: Fall    Mobility  Bed Mobility Overal bed mobility: Needs Assistance Bed Mobility: Supine to Sit     Supine to sit: Supervision     General bed mobility comments: supervision for safety; increased time and effort with use of rail  Transfers Overall transfer level: Needs assistance Equipment used: Rolling walker (2 wheeled) Transfers: Sit to/from [facility] Sit to Stand: Mod assist;+2 physical assistance;Min assist         General transfer comment: mod A +2 from EOB and recliner; cues for safe hand placement and bilat feet blocked  Ambulation/Gait Ambulation/Gait assistance: Min assist;+2 safety/equipment (chair follow) Ambulation Distance (Feet):  (39ft then 12ft) Assistive device: Rolling walker (2 wheeled) Gait Pattern/deviations: Step-through pattern;Decreased step length - right;Decreased step length - left;Trunk flexed     General Gait Details: very slow cadence; cues for posture and proximity of RW; pt tends to lean on RW when feeling fatigued; one seated rest break   Stairs            Wheelchair Mobility    Modified Rankin (Stroke Patients Only)        Balance Overall balance assessment: Needs assistance Sitting-balance support: Feet supported Sitting balance-Leahy Scale: Good     Standing balance support: Bilateral upper extremity supported Standing balance-Leahy Scale: Poor                              Cognition Arousal/Alertness: Awake/alert Behavior During Therapy: WFL for tasks assessed/performed Overall Cognitive Status: Within Functional Limits for tasks assessed                                        Exercises      General Comments General comments (skin integrity, edema, etc.): 2L O2 via [place] throughout session with SpO2 at 94-95%      Pertinent Vitals/Pain Pain Assessment: Faces Faces Pain Scale: Hurts little more Pain Location: L knee Pain Descriptors / Indicators: Guarding;Sore Pain Intervention(s): Monitored during session;Repositioned    Home Living                      Prior Function            PT Goals (current goals can now be found in the care plan section) Progress towards PT goals: Progressing toward goals    Frequency    Min 3X/week      PT Plan Current plan remains appropriate    Co-evaluation  AM-PAC PT "6 Clicks" Daily Activity  Outcome Measure  Difficulty turning over in bed (including adjusting bedclothes, sheets and blankets)?: Total Difficulty moving from lying on back to sitting on the side of the bed? : Total Difficulty sitting down on and standing up from a chair with arms (e.g., wheelchair, bedside commode, etc,.)?: Total Help needed moving to and from a bed to chair (including a wheelchair)?: A Little Help needed walking in hospital room?: A Little Help needed climbing 3-5 steps with a railing? : A Lot 6 Click Score: 11    End of Session Equipment Utilized During Treatment: Gait belt;Oxygen Activity Tolerance: Patient limited by fatigue Patient left: with call bell/phone within reach;in chair;with chair alarm  set Nurse Communication: Mobility status PT Visit Diagnosis: Other abnormalities of gait and mobility (R26.89);Muscle weakness (generalized) (M62.81)     Time: 3887-1959 PT Time Calculation (min) (ACUTE ONLY): 30 min  Charges:  $Gait Training: 8-22 mins $Therapeutic Activity: 8-22 mins                    G Codes:       Earney Navy, PTA Pager: [phone]     Darliss Cheney 02/16/2017, 12:21 PM

## 2017-02-16 NOTE — Progress Notes (Signed)
S: Knee pain better.  Eating better.  Smiling!! O:BP (!) 109/44 (BP Location: Left Wrist)   Pulse 74   Temp 98.1 F (36.7 C) (Oral)   Resp 18   Ht 5\' 3"  (1.6 m)   Wt 79.2 kg (174 lb 9.6 oz)   SpO2 99%   BMI 30.93 kg/m   Intake/Output Summary (Last 24 hours) at 02/16/17 0833 Last data filed at 02/16/17 0630  Gross per 24 hour  Intake              537 ml  Output             2503 ml  Net            -1966 ml   Weight change: 0.771 kg (1 lb 11.2 oz) JGG:EZMOQ and alert CVS:RRR Resp: rare crackle Abd: +BS NTND Ext: no edema.  LUA AVF + bruit NEURO:CNI, Ox3, No asterixis Rt IJ perm cath   . aspirin EC  81 mg Oral Daily  . brimonidine  1 drop Right Eye BID   And  . timolol  1 drop Right Eye BID  . bupivacaine  10 mL Infiltration Once  . calcitRIOL  0.5 mcg Oral Daily  . calcium acetate  667 mg Oral TID WC  . carvedilol  12.5 mg Oral BID WC  . cephALEXin  500 mg Oral Q12H  . darbepoetin (ARANESP) injection - DIALYSIS  60 mcg Intravenous Q Mon-HD  . heparin  5,000 Units Subcutaneous Q8H  . hydrALAZINE  25 mg Oral TID  . insulin aspart  0-9 Units Subcutaneous TID WC  . insulin detemir  15 Units Subcutaneous QHS  . isosorbide mononitrate  30 mg Oral Daily  . lidocaine  10 mL Infiltration Once  . methylPREDNISolone acetate  10 mg Intra-articular Once  . multivitamin  1 tablet Oral QHS  . polyethylene glycol  17 g Oral Daily   Dg Knee Complete 4 Views Left  Result Date: 02/14/2017 CLINICAL DATA:  Bilateral knee pain for 2 months, worsening EXAM: LEFT KNEE - COMPLETE 4+ VIEW COMPARISON:  06/10/2014 FINDINGS: Tricompartmental osteoarthritis with advanced lateral compartment narrowing and degenerative spurring. Prominent patellofemoral compartment spurring as well. Sclerosis below the anterior tibial plateau is chronic and stable from prior, likely subchondral cyst and reactive sclerosis. Atherosclerosis and nonspecific soft tissue reticulation. Osteopenia. IMPRESSION: 1. No acute  finding. 2. Tricompartmental osteoarthritis, particularly advanced in the lateral compartment. Electronically Signed   By: Monte Fantasia M.D.   On: 02/14/2017 16:38   Dg Knee Complete 4 Views Right  Result Date: 02/14/2017 CLINICAL DATA:  Knee pain, bilateral for 2 months EXAM: RIGHT KNEE - COMPLETE 4+ VIEW COMPARISON:  None. FINDINGS: Tricompartmental osteoarthritis with marginal spurring greatest at the lateral compartment. Possible small joint effusion. No acute fracture or malalignment. IMPRESSION: 1. No acute osseous finding. 2. Osteoarthritis, advanced at the lateral compartment. 3. Osteopenia. Electronically Signed   By: Monte Fantasia M.D.   On: 02/14/2017 16:51   BMET    Component Value Date/Time   NA 130 (L) 02/15/2017 0417   K 4.5 02/15/2017 0417   CL 95 (L) 02/15/2017 0417   CO2 26 02/15/2017 0417   GLUCOSE 176 (H) 02/15/2017 0417   BUN 24 (H) 02/15/2017 0417   CREATININE 4.61 (H) 02/15/2017 0417   CALCIUM 8.7 (L) 02/15/2017 0417   GFRNONAA 9 (L) 02/15/2017 0417   GFRAA 10 (L) 02/15/2017 0417   CBC    Component Value Date/Time   WBC 6.5  02/16/2017 0730   RBC 3.51 (L) 02/16/2017 0730   HGB 9.4 (L) 02/16/2017 0730   HCT 32.4 (L) 02/16/2017 0730   PLT 243 02/16/2017 0730   MCV 92.3 02/16/2017 0730   MCH 26.8 02/16/2017 0730   MCHC 29.0 (L) 02/16/2017 0730   RDW 14.8 02/16/2017 0730   LYMPHSABS 0.8 08/10/2015 0234   MONOABS 0.6 08/10/2015 0234   EOSABS 0.3 08/10/2015 0234   BASOSABS 0.0 08/10/2015 0234     Assessment:  1. New ESRD sec DM/HTN and cardiorenal syndrome 2. Anemia on aranesp and received IV iron 3. HTN 4. Sec HPTH on calcitriol 5. Diastolic and systolic CHF 6. Osteoarthritis of knees  Plan: 1.  HD tomorrow 2. PTH still pending 3. Suspect will need rehab.  Says she has not been out of bed??   Jelani Vreeland T

## 2017-02-17 DIAGNOSIS — R0600 Dyspnea, unspecified: Secondary | ICD-10-CM

## 2017-02-17 DIAGNOSIS — N184 Chronic kidney disease, stage 4 (severe): Secondary | ICD-10-CM

## 2017-02-17 DIAGNOSIS — E1122 Type 2 diabetes mellitus with diabetic chronic kidney disease: Secondary | ICD-10-CM

## 2017-02-17 DIAGNOSIS — I509 Heart failure, unspecified: Secondary | ICD-10-CM

## 2017-02-17 DIAGNOSIS — I953 Hypotension of hemodialysis: Secondary | ICD-10-CM

## 2017-02-17 LAB — BASIC METABOLIC PANEL
ANION GAP: 9 (ref 5–15)
BUN: 32 mg/dL — ABNORMAL HIGH (ref 6–20)
CHLORIDE: 91 mmol/L — AB (ref 101–111)
CO2: 27 mmol/L (ref 22–32)
Calcium: 9.3 mg/dL (ref 8.9–10.3)
Creatinine, Ser: 5.25 mg/dL — ABNORMAL HIGH (ref 0.44–1.00)
GFR calc non Af Amer: 7 mL/min — ABNORMAL LOW (ref >60)
GFR, EST AFRICAN AMERICAN: 9 mL/min — AB (ref >60)
Glucose, Bld: 218 mg/dL — ABNORMAL HIGH (ref 65–99)
Potassium: 4.7 mmol/L (ref 3.5–5.1)
Sodium: 127 mmol/L — ABNORMAL LOW (ref 135–145)

## 2017-02-17 LAB — CBC
HEMATOCRIT: 31.6 % — AB (ref 36.0–46.0)
HEMOGLOBIN: 9 g/dL — AB (ref 12.0–15.0)
MCH: 25.9 pg — ABNORMAL LOW (ref 26.0–34.0)
MCHC: 28.5 g/dL — ABNORMAL LOW (ref 30.0–36.0)
MCV: 90.8 fL (ref 78.0–100.0)
Platelets: 267 10*3/uL (ref 150–400)
RBC: 3.48 MIL/uL — ABNORMAL LOW (ref 3.87–5.11)
RDW: 14.7 % (ref 11.5–15.5)
WBC: 10.3 10*3/uL (ref 4.0–10.5)

## 2017-02-17 LAB — GLUCOSE, CAPILLARY
GLUCOSE-CAPILLARY: 242 mg/dL — AB (ref 65–99)
Glucose-Capillary: 97 mg/dL (ref 65–99)
Glucose-Capillary: 321 mg/dL — ABNORMAL HIGH (ref 65–99)

## 2017-02-17 MED ORDER — ACETAMINOPHEN 325 MG PO TABS
ORAL_TABLET | ORAL | Status: AC
  Administered 2017-02-17: 650 mg via ORAL
  Filled 2017-02-17: qty 2

## 2017-02-17 MED ORDER — OXYCODONE-ACETAMINOPHEN 5-325 MG PO TABS
ORAL_TABLET | ORAL | Status: AC
  Administered 2017-02-17: 2 via ORAL
  Filled 2017-02-17: qty 2

## 2017-02-17 MED ORDER — CALCITRIOL 0.5 MCG PO CAPS
ORAL_CAPSULE | ORAL | Status: AC
  Administered 2017-02-17: 0.5 ug via ORAL
  Filled 2017-02-17: qty 1

## 2017-02-17 MED ORDER — HYDRALAZINE HCL 25 MG PO TABS
25.0000 mg | ORAL_TABLET | Freq: Three times a day (TID) | ORAL | Status: DC | PRN
  Administered 2017-02-20 (×2): 25 mg via ORAL
  Filled 2017-02-17 (×3): qty 1

## 2017-02-17 MED ORDER — NEPRO/CARBSTEADY PO LIQD
237.0000 mL | Freq: Two times a day (BID) | ORAL | Status: DC
  Administered 2017-02-19: 237 mL via ORAL
  Filled 2017-02-17 (×8): qty 237

## 2017-02-17 NOTE — Progress Notes (Signed)
S: Had some hypotension on HD so decreased goal. SBP now 130. Ap 240 Vp 120  BFR 325 O:BP (!) 134/50 (BP Location: Right Arm)   Pulse 63   Temp 98.1 F (36.7 C) (Oral)   Resp 16   Ht 5\' 3"  (1.6 m)   Wt 81 kg (178 lb 8 oz)   SpO2 100%   BMI 31.62 kg/m   Intake/Output Summary (Last 24 hours) at 02/17/17 0715 Last data filed at 02/16/17 0900  Gross per 24 hour  Intake              240 ml  Output                0 ml  Net              240 ml   Weight change: -0.953 kg (-2 lb 1.6 oz) GEX:BMWUX and alert CVS:RRR Resp: clear ant Abd: +BS NTND Ext: no edema.  LUA AVF + bruit NEURO:CNI, Ox3, No asterixis Rt IJ perm cath   . aspirin EC  81 mg Oral Daily  . brimonidine  1 drop Right Eye BID   And  . timolol  1 drop Right Eye BID  . bupivacaine  10 mL Infiltration Once  . calcitRIOL  0.5 mcg Oral Daily  . calcium acetate  667 mg Oral TID WC  . carvedilol  12.5 mg Oral BID WC  . cephALEXin  500 mg Oral Q12H  . darbepoetin (ARANESP) injection - DIALYSIS  60 mcg Intravenous Q Mon-HD  . heparin  5,000 Units Subcutaneous Q8H  . hydrALAZINE  25 mg Oral TID  . insulin aspart  0-9 Units Subcutaneous TID WC  . insulin detemir  15 Units Subcutaneous QHS  . isosorbide mononitrate  30 mg Oral Daily  . lidocaine  10 mL Infiltration Once  . methylPREDNISolone acetate  10 mg Intra-articular Once  . multivitamin  1 tablet Oral QHS  . polyethylene glycol  17 g Oral Daily   No results found. BMET    Component Value Date/Time   NA 127 (L) 02/17/2017 0304   K 4.7 02/17/2017 0304   CL 91 (L) 02/17/2017 0304   CO2 27 02/17/2017 0304   GLUCOSE 218 (H) 02/17/2017 0304   BUN 32 (H) 02/17/2017 0304   CREATININE 5.25 (H) 02/17/2017 0304   CALCIUM 9.3 02/17/2017 0304   GFRNONAA 7 (L) 02/17/2017 0304   GFRAA 9 (L) 02/17/2017 0304   CBC    Component Value Date/Time   WBC 10.3 02/17/2017 0304   RBC 3.48 (L) 02/17/2017 0304   HGB 9.0 (L) 02/17/2017 0304   HCT 31.6 (L) 02/17/2017 0304   PLT  267 02/17/2017 0304   MCV 90.8 02/17/2017 0304   MCH 25.9 (L) 02/17/2017 0304   MCHC 28.5 (L) 02/17/2017 0304   RDW 14.7 02/17/2017 0304   LYMPHSABS 0.8 08/10/2015 0234   MONOABS 0.6 08/10/2015 0234   EOSABS 0.3 08/10/2015 0234   BASOSABS 0.0 08/10/2015 0234     Assessment:  1. New ESRD sec DM/HTN and cardiorenal syndrome.  Still awaiting outpt dialysis 2. Anemia on aranesp and received IV iron 3. HTN 4. Sec HPTH on calcitriol 5. Diastolic and systolic CHF 6. Osteoarthritis of knees  Plan: 1. Will DC hydralazine and use prn as hypotension with HD is recurrent problem 2. PTH pending  Kathleen [name] T

## 2017-02-17 NOTE — Progress Notes (Signed)
PROGRESS NOTE    Kathleen [name]  EUM:[id number] DOB: August 20, 1945 DOA: 02/06/2017 PCP: Benito Mccreedy, MD   Chief Complaint  Patient presents with  . Shortness of Breath  . Weakness    Brief Narrative:  HPI On 02/07/2017 by Dr. Gean Birchwood Britt Boozer Kathleen [name] is a 71 y.o. female with history of chronic kidney disease stage IV to 5, chronic combined systolic and diastolic CHF posterior fourchette was 40-45% with grade 1 diastolic dysfunction in December 2016 1 diabetes mellitus, anemia presents to the ER with increasing shortness of breath and lower extremity edema. Patient states 2 weeks ago patient's nephrologist increased her Lasix dose from once a day to 3 times a day. Patient was unable to take the dose because she was unable to ambulate for frequent urination. She was only taking twice a day. Patient's shortness of breath is mostly on lying down. Denies any chest pain or productive cough.  Interim history Patient is to start on IV Lasix without good diuresis. Creatinine was worsening. Nephrology was consulted and initiated hemodialysis. Vascular surgery also consulted with placement of HD catheter. Pending clip process and improvement in physical deconditioning prior to discharge. Assessment & Plan   Acute on chronic combined systolic and diastolic heart failure -Chest x-ray on admission showed mild CHF -BNP 463.4 -Echocardiogram showed EF of 45%, diffuse hypokinesis, grade 1 diastolic dysfunction -Patient was placed on IV Lasix, 120 mg twice a day however with little diuresis. Creatinine began to worsen. -Nephrology consult appreciated, initiated hemodialysis -Vascular surgery consulted, patient had vein mapping. AV fistula placed on 02/10/2017 (will need follow-up with Dr. Oneida Alar in 6 weeks) -Continue volume control with HD   New ESRD/Acute kidney kidney injury and CAD stage IV -? Cardiorenal syndrome -As stated above, patient was on diuresis however creatinine worsened and HD  initiated -Creatinine peaked at 5.05 -Awaiting outpatient dialysis spot -Unable to dialyze in a chair today due to hypotension -Will attempt on 02/20/17  Hyperkalemia/hyponatremia -Managed with HD  Hypertensive urgency -Resolved  Hypotension with dialysis -Patient placed in trendelenburg during HD -hydralazine discontinued and to be used PRN for hypertension  Anemia of chronic disease -Hemoglobin currently 9.4 -Continue Aranesp  Diabetes mellitus, type II -Continue Levemir, insulin sliding scale and CBG monitoring  Malnutrition -Nutrition consulted and recommended snacks between meals  Coronary artery disease -No complaints of chest pain, continue aspirin, Coreg  Elevated troponins -Likely secondary to demand ischemia from heart failure  Escherichia coli UTI -Continue Keflex  Bilateral knee pain -Suspect is secondary to arthritis -X-rays obtained, negative for effusion -Orthopedics consulted and appreciated, status post right knee aspiration and injection with mixture of lidocaine, Marcaine, Depo-Medrol -PT consulted recommended SNF  DVT Prophylaxis  heparin  Code Status: DO NOT RESUSCITATE   Family Communication: None at bedside   Disposition Plan: Admitted. D/C to SNF when patient can sit and transfer to dialysis, and when HD spot available  Consultants Nephrology Vascular surgery Orthopedics   Procedures  Echocardiogram Vein mapping Right IJ HD catheter placed Left brachiocephalic AV fistula Right knee aspiration and injection  Antibiotics   Anti-infectives    Start     Dose/Rate Route Frequency Ordered Stop   02/11/17 2200  cephALEXin (KEFLEX) capsule 500 mg     500 mg Oral Every 12 hours 02/11/17 1624     02/10/17 1245  ceFAZolin (ANCEF) IVPB 2g/100 mL premix     2 g 200 mL/hr over 30 Minutes Intravenous  Once 02/10/17 1231 02/10/17 1342   02/10/17 1232  ceFAZolin (ANCEF) 2-4 GM/100ML-% IVPB    Comments:  Henrine Screws   : cabinet override       02/10/17 1232 02/10/17 1312   02/10/17 0500  ceFAZolin (ANCEF) IVPB 1 g/50 mL premix  Status:  Discontinued    Comments:  Send with pt to OR   1 g 100 mL/hr over 30 Minutes Intravenous On call 02/09/17 1441 02/10/17 1231      Subjective:   Kathleen [name] seen and examined todayIn dialysis. Patient complains of feeling somewhat nauseous but mildly better. Currently denies chest pain, shortness of breath, abdominal pain, vomiting, diarrhea or constipation. Denies further knee pain at this time.  Objective:   Vitals:   02/17/17 1100 02/17/17 1130 02/17/17 1150 02/17/17 1153  BP: (!) 114/32 (!) 95/44 (!) 113/53 (!) 143/64  Pulse: 66 61 62 64  Resp:   14   Temp:   98 F (36.7 C)   TempSrc:      SpO2:      Weight:   80.5 kg (177 lb 7.5 oz)   Height:        Intake/Output Summary (Last 24 hours) at 02/17/17 1212 Last data filed at 02/17/17 1153  Gross per 24 hour  Intake                0 ml  Output             1000 ml  Net            -1000 ml   Filed Weights   02/17/17 0613 02/17/17 0721 02/17/17 1150  Weight: 81 kg (178 lb 8 oz) 81.5 kg (179 lb 10.8 oz) 80.5 kg (177 lb 7.5 oz)   Exam  General: Well developed, well nourished, NAD, appears stated age  HEENT: NCAT, mucous membranes moist.   Cardiovascular: S1 S2 auscultated, RRR, 2/6SEM  Respiratory: Clear to auscultation bilaterally with equal chest rise  Abdomen: Soft, nontender, nondistended, + bowel sounds  Extremities: warm dry without cyanosis clubbing or edema. LUE AVF  Neuro: AAOx3, nonfocal  Psych: Appropriate  Data Reviewed: I have personally reviewed following labs and imaging studies  CBC:  Recent Labs Lab 02/11/17 1100 02/13/17 0730 02/15/17 0417 02/16/17 0730 02/17/17 0304  WBC 4.1 5.6 7.1 6.5 10.3  HGB 9.4* 8.9* 8.8* 9.4* 9.0*  HCT 31.8* 30.4* 29.4* 32.4* 31.6*  MCV 89.6 90.7 91.3 92.3 90.8  PLT 272 249 292 243 161   Basic Metabolic Panel:  Recent Labs Lab 02/10/17 1819 02/11/17 1100  02/13/17 0730 02/15/17 0417 02/16/17 0730 02/17/17 0304  NA 134* 134* 130* 130* 131* 127*  K 5.6* 4.9 4.3 4.5 4.5 4.7  CL 98* 97* 94* 95* 95* 91*  CO2 23 29 28 26 26 27   GLUCOSE 145* 81 79 176* 198* 218*  BUN 88* 54* 35* 24* 17 32*  CREATININE 5.05* 3.96* 4.34* 4.61* 3.96* 5.25*  CALCIUM 8.5* 8.5* 8.7* 8.7* 8.8* 9.3  PHOS 6.6* 6.3* 5.8* 5.5*  --   --    GFR: Estimated Creatinine Clearance: 9.7 mL/min (A) (by C-G formula based on SCr of 5.25 mg/dL (H)). Liver Function Tests:  Recent Labs Lab 02/10/17 1819 02/11/17 1100 02/13/17 0730 02/15/17 0417  ALBUMIN 3.0* 2.6* 2.7* 2.7*   No results for input(s): LIPASE, AMYLASE in the last 168 hours. No results for input(s): AMMONIA in the last 168 hours. Coagulation Profile: No results for input(s): INR, PROTIME in the last 168 hours. Cardiac Enzymes: No results for input(s): CKTOTAL, CKMB,  CKMBINDEX, TROPONINI in the last 168 hours. BNP (last 3 results) No results for input(s): PROBNP in the last 8760 hours. HbA1C: No results for input(s): HGBA1C in the last 72 hours. CBG:  Recent Labs Lab 02/15/17 2129 02/16/17 0744 02/16/17 1131 02/16/17 1609 02/16/17 2141  GLUCAP 217* 205* 250* 268* 216*   Lipid Profile: No results for input(s): CHOL, HDL, LDLCALC, TRIG, CHOLHDL, LDLDIRECT in the last 72 hours. Thyroid Function Tests: No results for input(s): TSH, T4TOTAL, FREET4, T3FREE, THYROIDAB in the last 72 hours. Anemia Panel: No results for input(s): VITAMINB12, FOLATE, FERRITIN, TIBC, IRON, RETICCTPCT in the last 72 hours. Urine analysis:    Component Value Date/Time   COLORURINE YELLOW 02/08/2017 1351   APPEARANCEUR HAZY (A) 02/08/2017 1351   LABSPEC 1.009 02/08/2017 1351   PHURINE 5.0 02/08/2017 1351   GLUCOSEU NEGATIVE 02/08/2017 1351   HGBUR NEGATIVE 02/08/2017 1351   BILIRUBINUR NEGATIVE 02/08/2017 1351   KETONESUR NEGATIVE 02/08/2017 1351   PROTEINUR 100 (A) 02/08/2017 1351   UROBILINOGEN 0.2 06/11/2014 0639     NITRITE POSITIVE (A) 02/08/2017 1351   LEUKOCYTESUR LARGE (A) 02/08/2017 1351   Sepsis Labs: @LABRCNTIP (procalcitonin:4,lacticidven:4)  ) Recent Results (from the past 240 hour(s))  Culture, Urine     Status: Abnormal   Collection Time: 02/08/17  1:51 PM  Result Value Ref Range Status   Specimen Description URINE, RANDOM  Final   Special Requests NONE  Final   Culture >=100,000 COLONIES/mL ESCHERICHIA COLI (A)  Final   Report Status 02/11/2017 FINAL  Final   Organism ID, Bacteria ESCHERICHIA COLI (A)  Final      Susceptibility   Escherichia coli - MIC*    AMPICILLIN 16 INTERMEDIATE Intermediate     CEFAZOLIN <=4 SENSITIVE Sensitive     CEFTRIAXONE <=1 SENSITIVE Sensitive     CIPROFLOXACIN <=0.25 SENSITIVE Sensitive     GENTAMICIN <=1 SENSITIVE Sensitive     IMIPENEM <=0.25 SENSITIVE Sensitive     NITROFURANTOIN <=16 SENSITIVE Sensitive     TRIMETH/SULFA <=20 SENSITIVE Sensitive     AMPICILLIN/SULBACTAM <=2 SENSITIVE Sensitive     PIP/TAZO <=4 SENSITIVE Sensitive     Extended ESBL NEGATIVE Sensitive     * >=100,000 COLONIES/mL ESCHERICHIA COLI  Surgical PCR screen     Status: None   Collection Time: 02/10/17  5:28 AM  Result Value Ref Range Status   MRSA, PCR NEGATIVE NEGATIVE Final   Staphylococcus aureus NEGATIVE NEGATIVE Final    Comment:        The Xpert SA Assay (FDA approved for NASAL specimens in patients over 29 years of age), is one component of a comprehensive surveillance program.  Test performance has been validated by [facility] for patients greater than or equal to 43 year old. It is not intended to diagnose infection nor to guide or monitor treatment.       Radiology Studies: No results found.   Scheduled Meds: . aspirin EC  81 mg Oral Daily  . brimonidine  1 drop Right Eye BID   And  . timolol  1 drop Right Eye BID  . bupivacaine  10 mL Infiltration Once  . calcitRIOL  0.5 mcg Oral Daily  . calcium acetate  667 mg Oral TID WC  .  carvedilol  12.5 mg Oral BID WC  . cephALEXin  500 mg Oral Q12H  . darbepoetin (ARANESP) injection - DIALYSIS  60 mcg Intravenous Q Mon-HD  . heparin  5,000 Units Subcutaneous Q8H  . insulin aspart  0-9 Units Subcutaneous TID WC  . insulin detemir  15 Units Subcutaneous QHS  . isosorbide mononitrate  30 mg Oral Daily  . lidocaine  10 mL Infiltration Once  . methylPREDNISolone acetate  10 mg Intra-articular Once  . multivitamin  1 tablet Oral QHS  . polyethylene glycol  17 g Oral Daily   Continuous Infusions: . sodium chloride Stopped (02/15/17 0049)  . ferumoxytol Stopped (02/15/17 1221)     LOS: 10 days   Time Spent in minutes   30 minutes  Tkai Large D.O. on 02/17/2017 at 12:12 PM  Between 7am to 7pm - Pager - [phone]  After 7pm go to [email or web address] - password [email or web address]  And look for the night coverage person covering for me after hours  [facility] Office  [phone]

## 2017-02-17 NOTE — Care Management Important Message (Signed)
Important Message  Patient Details  Name: Kathleen [name] MRN: [id number] Date of Birth: [birth year]   Medicare Important Message Given:  Yes    Winter Jocelyn 02/17/2017, 1:17 PM

## 2017-02-17 NOTE — Progress Notes (Signed)
Inpatient Diabetes Program Recommendations  AACE/ADA: New Consensus Statement on Inpatient Glycemic Control (2015)  Target Ranges:  Prepandial:   less than 140 mg/dL      Peak postprandial:   less than 180 mg/dL (1-2 hours)      Critically ill patients:  140 - 180 mg/dL   Results for Kathleen [name], Kathleen [name] (MRN [id number]) as of 02/17/2017 10:28  Ref. Range 02/16/2017 07:44 02/16/2017 11:31 02/16/2017 16:09 02/16/2017 21:41  Glucose-Capillary Latest Ref Range: 65 - 99 mg/dL 205 (H) 250 (H) 268 (H) 216 (H)   Review of Glycemic Control  Diabetes history: DM 2 Outpatient Diabetes medications: Levemir 20 units, Novolog 0-9 units tid + Novolog 6 units meal coverage Current orders for Inpatient glycemic control: Levemir 15 units, Novolog Sensitive Correction 0-9 units tid  Inpatient Diabetes Program Recommendations:    Glucose trends elevated in the 200's, Consider increasing Levemir closer to home dose. Levemir 18 units.  Thanks,  Tama Headings RN, MSN, [facility] Inpatient Diabetes Coordinator Team Pager [phone] (8a-5p)

## 2017-02-18 LAB — GLUCOSE, CAPILLARY
GLUCOSE-CAPILLARY: 138 mg/dL — AB (ref 65–99)
Glucose-Capillary: 187 mg/dL — ABNORMAL HIGH (ref 65–99)
Glucose-Capillary: 239 mg/dL — ABNORMAL HIGH (ref 65–99)
Glucose-Capillary: 368 mg/dL — ABNORMAL HIGH (ref 65–99)

## 2017-02-18 LAB — BASIC METABOLIC PANEL
Anion gap: 11 (ref 5–15)
BUN: 26 mg/dL — ABNORMAL HIGH (ref 6–20)
CO2: 25 mmol/L (ref 22–32)
Calcium: 9 mg/dL (ref 8.9–10.3)
Chloride: 93 mmol/L — ABNORMAL LOW (ref 101–111)
Creatinine, Ser: 3.41 mg/dL — ABNORMAL HIGH (ref 0.44–1.00)
GFR calc Af Amer: 14 mL/min — ABNORMAL LOW (ref >60)
GFR calc non Af Amer: 12 mL/min — ABNORMAL LOW (ref >60)
Glucose, Bld: 187 mg/dL — ABNORMAL HIGH (ref 65–99)
Potassium: 4.4 mmol/L (ref 3.5–5.1)
Sodium: 129 mmol/L — ABNORMAL LOW (ref 135–145)

## 2017-02-18 LAB — CBC
HCT: 31.9 % — ABNORMAL LOW (ref 36.0–46.0)
HEMOGLOBIN: 9.4 g/dL — AB (ref 12.0–15.0)
MCH: 26.7 pg (ref 26.0–34.0)
MCHC: 29.5 g/dL — AB (ref 30.0–36.0)
MCV: 90.6 fL (ref 78.0–100.0)
Platelets: 234 10*3/uL (ref 150–400)
RBC: 3.52 MIL/uL — AB (ref 3.87–5.11)
RDW: 14.6 % (ref 11.5–15.5)
WBC: 9.9 10*3/uL (ref 4.0–10.5)

## 2017-02-18 LAB — PTH, INTACT AND CALCIUM
Calcium, Total (PTH): 9 mg/dL (ref 8.7–10.3)
PTH: 109 pg/mL — AB (ref 15–65)

## 2017-02-18 MED ORDER — CALCITRIOL 0.5 MCG PO CAPS
0.5000 ug | ORAL_CAPSULE | ORAL | Status: DC
  Administered 2017-02-20: 0.5 ug via ORAL

## 2017-02-18 NOTE — Social Work (Signed)
CSW met with patient this day. CSW reveiwed and provided patient SCAT application. Pt indicated that she thinks she may have transportation. CSW encouraged her to still complete SCAT application and have doctor complete as well. Pt agreed to same.  HD spot pending. Will DC to [facility] when ready.  Elissa Hefty, LCSW Clinical Social Worker [phone]

## 2017-02-18 NOTE — Plan of Care (Signed)
Problem: Cardiac: Goal: Ability to achieve and maintain adequate cardiopulmonary perfusion will improve Outcome: Progressing Maintains oxygen saturations in the mid 90's to 100% on room air

## 2017-02-18 NOTE — Progress Notes (Signed)
Patient without complaint during 7 a to 7 p shift, VSS.  Sat up in chair for entire shift.

## 2017-02-18 NOTE — Progress Notes (Signed)
PROGRESS NOTE    Kathleen [name]  VOJ:[id number] DOB: 28-Apr-1946 DOA: 02/06/2017 PCP: Benito Mccreedy, MD   Chief Complaint  Patient presents with  . Shortness of Breath  . Weakness    Brief Narrative:  HPI On 02/07/2017 by Dr. Gean Birchwood Kathleen [name] Kathleen [name] is a 71 y.o. female with history of chronic kidney disease stage IV to 5, chronic combined systolic and diastolic CHF posterior fourchette was 40-45% with grade 1 diastolic dysfunction in December 2016 1 diabetes mellitus, anemia presents to the ER with increasing shortness of breath and lower extremity edema. Patient states 2 weeks ago patient's nephrologist increased her Lasix dose from once a day to 3 times a day. Patient was unable to take the dose because she was unable to ambulate for frequent urination. She was only taking twice a day. Patient's shortness of breath is mostly on lying down. Denies any chest pain or productive cough.  Interim history Patient is to start on IV Lasix without good diuresis. Creatinine was worsening. Nephrology was consulted and initiated hemodialysis. Vascular surgery also consulted with placement of HD catheter. Pending clip process and improvement in physical deconditioning prior to discharge. Assessment & Plan   Acute on chronic combined systolic and diastolic heart failure -Chest x-ray on admission showed mild CHF -BNP 463.4 -Echocardiogram showed EF of 45%, diffuse hypokinesis, grade 1 diastolic dysfunction -Patient was placed on IV Lasix, 120 mg twice a day however with little diuresis. Creatinine began to worsen. -Nephrology consult appreciated, initiated hemodialysis -Vascular surgery consulted, patient had vein mapping. AV fistula placed on 02/10/2017 (will need follow-up with Dr. Oneida Alar in 6 weeks) -Continue volume control with HD   New ESRD/Acute kidney kidney injury and CAD stage IV -? Cardiorenal syndrome -As stated above, patient was on diuresis however creatinine worsened and HD  initiated -Creatinine peaked at 5.05 -Awaiting outpatient dialysis spot -Unable to dialyze in a chair 02/17/17 due to hypotension -Will attempt on 02/20/17  Hyperkalemia/hyponatremia -Managed with HD  Hypertensive urgency -Resolved  Hypotension with dialysis -Patient placed in trendelenburg during HD -hydralazine discontinued and to be used PRN for hypertension  Anemia of chronic disease -Hemoglobin currently 9.4 -Continue Aranesp  Diabetes mellitus, type II -Continue Levemir, insulin sliding scale and CBG monitoring  Malnutrition -Nutrition consulted and recommended snacks between meals  Coronary artery disease -No complaints of chest pain, continue aspirin, Coreg  Elevated troponins -Likely secondary to demand ischemia from heart failure  Escherichia coli UTI -Continue Keflex  Bilateral knee pain -Suspect is secondary to arthritis -X-rays obtained, negative for effusion -Orthopedics consulted and appreciated, status post right knee aspiration and injection with mixture of lidocaine, Marcaine, Depo-Medrol -PT consulted recommended SNF  DVT Prophylaxis  heparin  Code Status: DO NOT RESUSCITATE   Family Communication: None at bedside   Disposition Plan: Admitted. D/C to SNF when patient can sit and transfer to dialysis, and when HD spot available  Consultants Nephrology Vascular surgery Orthopedics   Procedures  Echocardiogram Vein mapping Right IJ HD catheter placed Left brachiocephalic AV fistula Right knee aspiration and injection  Antibiotics   Anti-infectives    Start     Dose/Rate Route Frequency Ordered Stop   02/11/17 2200  cephALEXin (KEFLEX) capsule 500 mg     500 mg Oral Every 12 hours 02/11/17 1624     02/10/17 1245  ceFAZolin (ANCEF) IVPB 2g/100 mL premix     2 g 200 mL/hr over 30 Minutes Intravenous  Once 02/10/17 1231 02/10/17 1342   02/10/17 1232  ceFAZolin (ANCEF) 2-4 GM/100ML-% IVPB    Comments:  Henrine Screws   : cabinet override       02/10/17 1232 02/10/17 1312   02/10/17 0500  ceFAZolin (ANCEF) IVPB 1 g/50 mL premix  Status:  Discontinued    Comments:  Send with pt to OR   1 g 100 mL/hr over 30 Minutes Intravenous On call 02/09/17 1441 02/10/17 1231      Subjective:   Kathleen [name] seen and examined todayIn dialysis. Patient has no complaints today. Denies current abdominal pain, nausea, vomiting. Denies chest pain, shortness of breath.  Objective:   Vitals:   02/17/17 1945 02/18/17 0628 02/18/17 0953 02/18/17 1400  BP: (!) 103/44 (!) 156/53 (!) 131/44 (!) 122/44  Pulse: 67 66 64 66  Resp: 18 18  18   Temp: 99 F (37.2 C) 98.3 F (36.8 C)  97.6 F (36.4 C)  TempSrc: Oral Oral  Oral  SpO2: 95% 94% 99% 96%  Weight:  80.2 kg (176 lb 14.4 oz)    Height:        Intake/Output Summary (Last 24 hours) at 02/18/17 1602 Last data filed at 02/18/17 1330  Gross per 24 hour  Intake             1080 ml  Output                1 ml  Net             1079 ml   Filed Weights   02/17/17 0721 02/17/17 1150 02/18/17 0628  Weight: 81.5 kg (179 lb 10.8 oz) 80.5 kg (177 lb 7.5 oz) 80.2 kg (176 lb 14.4 oz)   Exam  General: Well developed, well nourished, NAD, appears stated age  HEENT: NCAT, mucous membranes moist.   Cardiovascular: S1 S2 auscultated, 2/6SEM, RRR  Respiratory: Clear to auscultation bilaterally with equal chest rise, no wheezing or rhonchi  Abdomen: Soft, nontender, nondistended, + bowel sounds  Extremities: warm dry without cyanosis clubbing or edema  Neuro: AAOx3, nonfocal  Psych: Normal affect and demeanor with intact judgement and insight  Data Reviewed: I have personally reviewed following labs and imaging studies  CBC:  Recent Labs Lab 02/13/17 0730 02/15/17 0417 02/16/17 0730 02/17/17 0304 02/18/17 0454  WBC 5.6 7.1 6.5 10.3 9.9  HGB 8.9* 8.8* 9.4* 9.0* 9.4*  HCT 30.4* 29.4* 32.4* 31.6* 31.9*  MCV 90.7 91.3 92.3 90.8 90.6  PLT 249 292 243 267 809   Basic Metabolic  Panel:  Recent Labs Lab 02/13/17 0730 02/15/17 0417 02/16/17 0730 02/17/17 0304 02/17/17 0730 02/18/17 0454  NA 130* 130* 131* 127*  --  129*  K 4.3 4.5 4.5 4.7  --  4.4  CL 94* 95* 95* 91*  --  93*  CO2 28 26 26 27   --  25  GLUCOSE 79 176* 198* 218*  --  187*  BUN 35* 24* 17 32*  --  26*  CREATININE 4.34* 4.61* 3.96* 5.25*  --  3.41*  CALCIUM 8.7* 8.7* 8.8* 9.3 9.0 9.0  PHOS 5.8* 5.5*  --   --   --   --    GFR: Estimated Creatinine Clearance: 14.9 mL/min (A) (by C-G formula based on SCr of 3.41 mg/dL (H)). Liver Function Tests:  Recent Labs Lab 02/13/17 0730 02/15/17 0417  ALBUMIN 2.7* 2.7*   No results for input(s): LIPASE, AMYLASE in the last 168 hours. No results for input(s): AMMONIA in the last 168 hours. Coagulation Profile: No results  for input(s): INR, PROTIME in the last 168 hours. Cardiac Enzymes: No results for input(s): CKTOTAL, CKMB, CKMBINDEX, TROPONINI in the last 168 hours. BNP (last 3 results) No results for input(s): PROBNP in the last 8760 hours. HbA1C: No results for input(s): HGBA1C in the last 72 hours. CBG:  Recent Labs Lab 02/17/17 1311 02/17/17 1619 02/17/17 2236 02/18/17 0744 02/18/17 1209  GLUCAP 97 242* 321* 138* 187*   Lipid Profile: No results for input(s): CHOL, HDL, LDLCALC, TRIG, CHOLHDL, LDLDIRECT in the last 72 hours. Thyroid Function Tests: No results for input(s): TSH, T4TOTAL, FREET4, T3FREE, THYROIDAB in the last 72 hours. Anemia Panel: No results for input(s): VITAMINB12, FOLATE, FERRITIN, TIBC, IRON, RETICCTPCT in the last 72 hours. Urine analysis:    Component Value Date/Time   COLORURINE YELLOW 02/08/2017 1351   APPEARANCEUR HAZY (A) 02/08/2017 1351   LABSPEC 1.009 02/08/2017 1351   PHURINE 5.0 02/08/2017 1351   GLUCOSEU NEGATIVE 02/08/2017 1351   HGBUR NEGATIVE 02/08/2017 1351   BILIRUBINUR NEGATIVE 02/08/2017 1351   KETONESUR NEGATIVE 02/08/2017 1351   PROTEINUR 100 (A) 02/08/2017 1351   UROBILINOGEN  0.2 06/11/2014 0639   NITRITE POSITIVE (A) 02/08/2017 1351   LEUKOCYTESUR LARGE (A) 02/08/2017 1351   Sepsis Labs: @LABRCNTIP (procalcitonin:4,lacticidven:4)  ) Recent Results (from the past 240 hour(s))  Surgical PCR screen     Status: None   Collection Time: 02/10/17  5:28 AM  Result Value Ref Range Status   MRSA, PCR NEGATIVE NEGATIVE Final   Staphylococcus aureus NEGATIVE NEGATIVE Final    Comment:        The Xpert SA Assay (FDA approved for NASAL specimens in patients over 92 years of age), is one component of a comprehensive surveillance program.  Test performance has been validated by [facility] for patients greater than or equal to 6 year old. It is not intended to diagnose infection nor to guide or monitor treatment.       Radiology Studies: No results found.   Scheduled Meds: . aspirin EC  81 mg Oral Daily  . brimonidine  1 drop Right Eye BID   And  . timolol  1 drop Right Eye BID  . bupivacaine  10 mL Infiltration Once  . [START ON 02/20/2017] calcitRIOL  0.5 mcg Oral Q M,W,F-HD  . calcium acetate  667 mg Oral TID WC  . carvedilol  12.5 mg Oral BID WC  . cephALEXin  500 mg Oral Q12H  . darbepoetin (ARANESP) injection - DIALYSIS  60 mcg Intravenous Q Mon-HD  . feeding supplement (NEPRO CARB STEADY)  237 mL Oral BID BM  . heparin  5,000 Units Subcutaneous Q8H  . insulin aspart  0-9 Units Subcutaneous TID WC  . insulin detemir  15 Units Subcutaneous QHS  . isosorbide mononitrate  30 mg Oral Daily  . lidocaine  10 mL Infiltration Once  . methylPREDNISolone acetate  10 mg Intra-articular Once  . multivitamin  1 tablet Oral QHS  . polyethylene glycol  17 g Oral Daily   Continuous Infusions: . sodium chloride Stopped (02/15/17 0049)  . ferumoxytol Stopped (02/15/17 1221)     LOS: 11 days   Time Spent in minutes   30 minutes  Deziree Mokry D.O. on 02/18/2017 at 4:02 PM  Between 7am to 7pm - Pager - [phone]  After 7pm go to [email or web address] -  password [email or web address]  And look for the night coverage person covering for me after hours  [facility] Office  [phone]

## 2017-02-18 NOTE — Progress Notes (Signed)
S: No new CO O:BP (!) 156/53 (BP Location: Right Arm)   Pulse 66   Temp 98.3 F (36.8 C) (Oral)   Resp 18   Ht 5\' 3"  (1.6 m)   Wt 80.2 kg (176 lb 14.4 oz)   SpO2 94%   BMI 31.34 kg/m   Intake/Output Summary (Last 24 hours) at 02/18/17 0853 Last data filed at 02/18/17 2878  Gross per 24 hour  Intake              720 ml  Output             1000 ml  Net             -280 ml   Weight change: 0.533 kg (1 lb 2.8 oz) MVE:HMCNO and alert CVS:RRR Resp: clear ant Abd: +BS NTND Ext: no edema.  LUA AVF + bruit NEURO:CNI, Ox3, No asterixis Rt IJ perm cath   . aspirin EC  81 mg Oral Daily  . brimonidine  1 drop Right Eye BID   And  . timolol  1 drop Right Eye BID  . bupivacaine  10 mL Infiltration Once  . calcitRIOL  0.5 mcg Oral Daily  . calcium acetate  667 mg Oral TID WC  . carvedilol  12.5 mg Oral BID WC  . cephALEXin  500 mg Oral Q12H  . darbepoetin (ARANESP) injection - DIALYSIS  60 mcg Intravenous Q Mon-HD  . feeding supplement (NEPRO CARB STEADY)  237 mL Oral BID BM  . heparin  5,000 Units Subcutaneous Q8H  . insulin aspart  0-9 Units Subcutaneous TID WC  . insulin detemir  15 Units Subcutaneous QHS  . isosorbide mononitrate  30 mg Oral Daily  . lidocaine  10 mL Infiltration Once  . methylPREDNISolone acetate  10 mg Intra-articular Once  . multivitamin  1 tablet Oral QHS  . polyethylene glycol  17 g Oral Daily   No results found. BMET    Component Value Date/Time   NA 129 (L) 02/18/2017 0454   K 4.4 02/18/2017 0454   CL 93 (L) 02/18/2017 0454   CO2 25 02/18/2017 0454   GLUCOSE 187 (H) 02/18/2017 0454   BUN 26 (H) 02/18/2017 0454   CREATININE 3.41 (H) 02/18/2017 0454   CALCIUM 9.0 02/18/2017 0454   CALCIUM 9.0 02/17/2017 0730   GFRNONAA 12 (L) 02/18/2017 0454   GFRAA 14 (L) 02/18/2017 0454   CBC    Component Value Date/Time   WBC 9.9 02/18/2017 0454   RBC 3.52 (L) 02/18/2017 0454   HGB 9.4 (L) 02/18/2017 0454   HCT 31.9 (L) 02/18/2017 0454   PLT 234  02/18/2017 0454   MCV 90.6 02/18/2017 0454   MCH 26.7 02/18/2017 0454   MCHC 29.5 (L) 02/18/2017 0454   RDW 14.6 02/18/2017 0454   LYMPHSABS 0.8 08/10/2015 0234   MONOABS 0.6 08/10/2015 0234   EOSABS 0.3 08/10/2015 0234   BASOSABS 0.0 08/10/2015 0234     Assessment:  1. New ESRD sec DM/HTN and cardiorenal syndrome.  Still awaiting outpt dialysis. On MWF as inpt 2. Anemia on aranesp and received IV iron 3. HTN 4. Sec HPTH on calcitriol.  PTH 109 5. Diastolic and systolic CHF 6. Osteoarthritis of knees  Plan: 1. Will decrease calcitriol to .56mcg on HD days only 2. Still awaiting outpt HD spot 3. Needs PT 4. Plan next HD Mon  Su Duma T

## 2017-02-19 DIAGNOSIS — N390 Urinary tract infection, site not specified: Secondary | ICD-10-CM

## 2017-02-19 DIAGNOSIS — M25569 Pain in unspecified knee: Secondary | ICD-10-CM

## 2017-02-19 LAB — GLUCOSE, CAPILLARY
GLUCOSE-CAPILLARY: 228 mg/dL — AB (ref 65–99)
GLUCOSE-CAPILLARY: 189 mg/dL — AB (ref 65–99)
GLUCOSE-CAPILLARY: 228 mg/dL — AB (ref 65–99)
Glucose-Capillary: 227 mg/dL — ABNORMAL HIGH (ref 65–99)

## 2017-02-19 NOTE — Progress Notes (Signed)
S: No new CO.  Eating some O:BP (!) 116/44 (BP Location: Right Arm)   Pulse 63   Temp 97.8 F (36.6 C) (Oral)   Resp 18   Ht 5\' 3"  (1.6 m)   Wt 81.5 kg (179 lb 9.6 oz)   SpO2 98%   BMI 31.81 kg/m   Intake/Output Summary (Last 24 hours) at 02/19/17 0816 Last data filed at 02/19/17 0600  Gross per 24 hour  Intake              940 ml  Output                1 ml  Net              939 ml   Weight change: -0.034 kg (-1.2 oz) EHO:ZYYQM and alert CVS:RRR Resp: clear ant Abd: +BS NTND Ext: no edema.  LUA AVF + bruit NEURO:CNI, Ox3, No asterixis Rt IJ perm cath   . aspirin EC  81 mg Oral Daily  . brimonidine  1 drop Right Eye BID   And  . timolol  1 drop Right Eye BID  . bupivacaine  10 mL Infiltration Once  . [START ON 02/20/2017] calcitRIOL  0.5 mcg Oral Q M,W,F-HD  . calcium acetate  667 mg Oral TID WC  . carvedilol  12.5 mg Oral BID WC  . cephALEXin  500 mg Oral Q12H  . darbepoetin (ARANESP) injection - DIALYSIS  60 mcg Intravenous Q Mon-HD  . feeding supplement (NEPRO CARB STEADY)  237 mL Oral BID BM  . heparin  5,000 Units Subcutaneous Q8H  . insulin aspart  0-9 Units Subcutaneous TID WC  . insulin detemir  15 Units Subcutaneous QHS  . isosorbide mononitrate  30 mg Oral Daily  . lidocaine  10 mL Infiltration Once  . methylPREDNISolone acetate  10 mg Intra-articular Once  . multivitamin  1 tablet Oral QHS  . polyethylene glycol  17 g Oral Daily   No results found. BMET    Component Value Date/Time   NA 129 (L) 02/18/2017 0454   K 4.4 02/18/2017 0454   CL 93 (L) 02/18/2017 0454   CO2 25 02/18/2017 0454   GLUCOSE 187 (H) 02/18/2017 0454   BUN 26 (H) 02/18/2017 0454   CREATININE 3.41 (H) 02/18/2017 0454   CALCIUM 9.0 02/18/2017 0454   CALCIUM 9.0 02/17/2017 0730   GFRNONAA 12 (L) 02/18/2017 0454   GFRAA 14 (L) 02/18/2017 0454   CBC    Component Value Date/Time   WBC 9.9 02/18/2017 0454   RBC 3.52 (L) 02/18/2017 0454   HGB 9.4 (L) 02/18/2017 0454   HCT  31.9 (L) 02/18/2017 0454   PLT 234 02/18/2017 0454   MCV 90.6 02/18/2017 0454   MCH 26.7 02/18/2017 0454   MCHC 29.5 (L) 02/18/2017 0454   RDW 14.6 02/18/2017 0454   LYMPHSABS 0.8 08/10/2015 0234   MONOABS 0.6 08/10/2015 0234   EOSABS 0.3 08/10/2015 0234   BASOSABS 0.0 08/10/2015 0234     Assessment:  1. New ESRD sec DM/HTN and cardiorenal syndrome.  Still awaiting outpt dialysis. On MWF as inpt 2. Anemia on aranesp and received IV iron 3. HTN 4. Sec HPTH on calcitriol.  PTH 109 5. Diastolic and systolic CHF 6. Osteoarthritis of knees  Plan: 1. HD tomorrow.  Will do first shift in case DC'd 2. Should have outpt HD spot tomorrow 3. Check labs with HD tomorrow  Reagann Dolce T

## 2017-02-19 NOTE — Progress Notes (Signed)
Patient without complaint on 7 a to 7 p shift, VSS.  Did have an episode of bleeding left lower quadrant from prior heparin injection.  Pressure dressing applied by RN, bleeding stopped.  Son at bedside part of the shift.

## 2017-02-19 NOTE — Progress Notes (Signed)
PROGRESS NOTE    Kathleen [name]  RKY:[id number] DOB: [birth year] DOA: 02/06/2017 PCP: Benito Mccreedy, MD   Chief Complaint  Patient presents with  . Shortness of Breath  . Weakness    Brief Narrative:  HPI On 02/07/2017 by Dr. Gean Birchwood Kathleen [name] is a 71 y.o. female with history of chronic kidney disease stage IV to 5, chronic combined systolic and diastolic CHF posterior fourchette was 40-45% with grade 1 diastolic dysfunction in December 2016 1 diabetes mellitus, anemia presents to the ER with increasing shortness of breath and lower extremity edema. Patient states 2 weeks ago patient's nephrologist increased her Lasix dose from once a day to 3 times a day. Patient was unable to take the dose because she was unable to ambulate for frequent urination. She was only taking twice a day. Patient's shortness of breath is mostly on lying down. Denies any chest pain or productive cough.  Interim history Patient is to start on IV Lasix without good diuresis. Creatinine was worsening. Nephrology was consulted and initiated hemodialysis. Vascular surgery also consulted with placement of HD catheter. Pending clip process and improvement in physical deconditioning prior to discharge. Assessment & Plan   Acute on chronic combined systolic and diastolic heart failure -Chest x-ray on admission showed mild CHF -BNP 463.4 -Echocardiogram showed EF of 45%, diffuse hypokinesis, grade 1 diastolic dysfunction -Patient was placed on IV Lasix, 120 mg twice a day however with little diuresis. Creatinine began to worsen. -Nephrology consult appreciated, initiated hemodialysis -Vascular surgery consulted, patient had vein mapping. AV fistula placed on 02/10/2017 (will need follow-up with Dr. Oneida Alar in 6 weeks) -Continue volume control with HD  -Currently appears stable  New ESRD/Acute kidney kidney injury and CAD stage IV -? Cardiorenal syndrome -As stated above, patient was on diuresis however  creatinine worsened and HD initiated -Creatinine peaked at 5.05 -Awaiting outpatient dialysis spot -Unable to dialyze in a chair 02/17/17 due to hypotension -Will attempt on 02/20/17  Hyperkalemia/hyponatremia -Managed with HD  Hypertensive urgency -Resolved  Hypotension with dialysis -Patient placed in trendelenburg during HD -hydralazine discontinued and to be used PRN for hypertension -BP has remained stable  Anemia of chronic disease -Hemoglobin currently 9.4 -Continue Aranesp  Diabetes mellitus, type II -Continue Levemir, insulin sliding scale and CBG monitoring  Malnutrition -Nutrition consulted and recommended snacks between meals  Coronary artery disease -No complaints of chest pain, continue aspirin, Coreg  Elevated troponins -Likely secondary to demand ischemia from heart failure  Escherichia coli UTI -Was placed on keflex, completed course  Bilateral knee pain -Suspect is secondary to arthritis -X-rays obtained, negative for effusion -Orthopedics consulted and appreciated, status post right knee aspiration and injection with mixture of lidocaine, Marcaine, Depo-Medrol -PT consulted recommended SNF  DVT Prophylaxis  heparin  Code Status: DO NOT RESUSCITATE   Family Communication: None at bedside   Disposition Plan: Admitted. D/C to SNF when patient can sit and transfer to dialysis, and when HD spot available  Consultants Nephrology Vascular surgery Orthopedics   Procedures  Echocardiogram Vein mapping Right IJ HD catheter placed Left brachiocephalic AV fistula Right knee aspiration and injection  Antibiotics   Anti-infectives    Start     Dose/Rate Route Frequency Ordered Stop   02/11/17 2200  cephALEXin (KEFLEX) capsule 500 mg     500 mg Oral Every 12 hours 02/11/17 1624     02/10/17 1245  ceFAZolin (ANCEF) IVPB 2g/100 mL premix     2 g 200 mL/hr over 30 Minutes  Intravenous  Once 02/10/17 1231 02/10/17 1342   02/10/17 1232  ceFAZolin  (ANCEF) 2-4 GM/100ML-% IVPB    Comments:  Kathleen [name]   : cabinet override      02/10/17 1232 02/10/17 1312   02/10/17 0500  ceFAZolin (ANCEF) IVPB 1 g/50 mL premix  Status:  Discontinued    Comments:  Send with pt to OR   1 g 100 mL/hr over 30 Minutes Intravenous On call 02/09/17 1441 02/10/17 1231      Subjective:   Kathleen [name] seen and examined today.  Currently sitting up in chair and has no complaints. Denies chest pain, shortness of breath, abdominal pain, nausea or vomiting, diarrhea or constipation.  Objective:   Vitals:   02/18/17 2042 02/19/17 0034 02/19/17 0606 02/19/17 0953  BP: (!) 135/53 (!) 140/40 (!) 116/44 (!) 133/50  Pulse: 71 68 63 62  Resp: 18  18   Temp: 98.4 F (36.9 C)  97.8 F (36.6 C)   TempSrc: Oral  Oral   SpO2: 98%  98% 100%  Weight:   81.5 kg (179 lb 9.6 oz)   Height:        Intake/Output Summary (Last 24 hours) at 02/19/17 1104 Last data filed at 02/19/17 0900  Gross per 24 hour  Intake              940 ml  Output                1 ml  Net              939 ml   Filed Weights   02/17/17 1150 02/18/17 0628 02/19/17 0606  Weight: 80.5 kg (177 lb 7.5 oz) 80.2 kg (176 lb 14.4 oz) 81.5 kg (179 lb 9.6 oz)   Exam  General: Well developed, well nourished, NAD, appears stated age  HEENT: NCAT, mucous membranes moist.   Cardiovascular: S1 S2 auscultated, RRR, 2/6SEM  Respiratory: Clear to auscultation bilaterally with equal chest rise, no wheezing  Abdomen: Soft, nontender, nondistended, + bowel sounds  Extremities: warm dry without cyanosis clubbing or edema. LUE AVF +B  Neuro: AAOx3, nonfocal  Psych: Appropriate mood and affect, pleasant  Data Reviewed: I have personally reviewed following labs and imaging studies  CBC:  Recent Labs Lab 02/13/17 0730 02/15/17 0417 02/16/17 0730 02/17/17 0304 02/18/17 0454  WBC 5.6 7.1 6.5 10.3 9.9  HGB 8.9* 8.8* 9.4* 9.0* 9.4*  HCT 30.4* 29.4* 32.4* 31.6* 31.9*  MCV 90.7 91.3 92.3 90.8  90.6  PLT 249 292 243 267 151   Basic Metabolic Panel:  Recent Labs Lab 02/13/17 0730 02/15/17 0417 02/16/17 0730 02/17/17 0304 02/17/17 0730 02/18/17 0454  NA 130* 130* 131* 127*  --  129*  K 4.3 4.5 4.5 4.7  --  4.4  CL 94* 95* 95* 91*  --  93*  CO2 28 26 26 27   --  25  GLUCOSE 79 176* 198* 218*  --  187*  BUN 35* 24* 17 32*  --  26*  CREATININE 4.34* 4.61* 3.96* 5.25*  --  3.41*  CALCIUM 8.7* 8.7* 8.8* 9.3 9.0 9.0  PHOS 5.8* 5.5*  --   --   --   --    GFR: Estimated Creatinine Clearance: 15.1 mL/min (A) (by C-G formula based on SCr of 3.41 mg/dL (H)). Liver Function Tests:  Recent Labs Lab 02/13/17 0730 02/15/17 0417  ALBUMIN 2.7* 2.7*   No results for input(s): LIPASE, AMYLASE in the last 168 hours.  No results for input(s): AMMONIA in the last 168 hours. Coagulation Profile: No results for input(s): INR, PROTIME in the last 168 hours. Cardiac Enzymes: No results for input(s): CKTOTAL, CKMB, CKMBINDEX, TROPONINI in the last 168 hours. BNP (last 3 results) No results for input(s): PROBNP in the last 8760 hours. HbA1C: No results for input(s): HGBA1C in the last 72 hours. CBG:  Recent Labs Lab 02/18/17 0744 02/18/17 1209 02/18/17 1656 02/18/17 2201 02/19/17 0746  GLUCAP 138* 187* 239* 368* 227*   Lipid Profile: No results for input(s): CHOL, HDL, LDLCALC, TRIG, CHOLHDL, LDLDIRECT in the last 72 hours. Thyroid Function Tests: No results for input(s): TSH, T4TOTAL, FREET4, T3FREE, THYROIDAB in the last 72 hours. Anemia Panel: No results for input(s): VITAMINB12, FOLATE, FERRITIN, TIBC, IRON, RETICCTPCT in the last 72 hours. Urine analysis:    Component Value Date/Time   COLORURINE YELLOW 02/08/2017 1351   APPEARANCEUR HAZY (A) 02/08/2017 1351   LABSPEC 1.009 02/08/2017 1351   PHURINE 5.0 02/08/2017 1351   GLUCOSEU NEGATIVE 02/08/2017 1351   HGBUR NEGATIVE 02/08/2017 1351   BILIRUBINUR NEGATIVE 02/08/2017 1351   KETONESUR NEGATIVE 02/08/2017 1351    PROTEINUR 100 (A) 02/08/2017 1351   UROBILINOGEN 0.2 06/11/2014 0639   NITRITE POSITIVE (A) 02/08/2017 1351   LEUKOCYTESUR LARGE (A) 02/08/2017 1351   Sepsis Labs: @LABRCNTIP (procalcitonin:4,lacticidven:4)  ) Recent Results (from the past 240 hour(s))  Surgical PCR screen     Status: None   Collection Time: 02/10/17  5:28 AM  Result Value Ref Range Status   MRSA, PCR NEGATIVE NEGATIVE Final   Staphylococcus aureus NEGATIVE NEGATIVE Final    Comment:        The Xpert SA Assay (FDA approved for NASAL specimens in patients over 56 years of age), is one component of a comprehensive surveillance program.  Test performance has been validated by [facility] for patients greater than or equal to 23 year old. It is not intended to diagnose infection nor to guide or monitor treatment.       Radiology Studies: No results found.   Scheduled Meds: . aspirin EC  81 mg Oral Daily  . brimonidine  1 drop Right Eye BID   And  . timolol  1 drop Right Eye BID  . bupivacaine  10 mL Infiltration Once  . [START ON 02/20/2017] calcitRIOL  0.5 mcg Oral Q M,W,F-HD  . calcium acetate  667 mg Oral TID WC  . carvedilol  12.5 mg Oral BID WC  . cephALEXin  500 mg Oral Q12H  . darbepoetin (ARANESP) injection - DIALYSIS  60 mcg Intravenous Q Mon-HD  . feeding supplement (NEPRO CARB STEADY)  237 mL Oral BID BM  . heparin  5,000 Units Subcutaneous Q8H  . insulin aspart  0-9 Units Subcutaneous TID WC  . insulin detemir  15 Units Subcutaneous QHS  . isosorbide mononitrate  30 mg Oral Daily  . lidocaine  10 mL Infiltration Once  . methylPREDNISolone acetate  10 mg Intra-articular Once  . multivitamin  1 tablet Oral QHS  . polyethylene glycol  17 g Oral Daily   Continuous Infusions: . sodium chloride Stopped (02/15/17 0049)  . ferumoxytol Stopped (02/15/17 1221)     LOS: 12 days   Time Spent in minutes   30 minutes  Modesto Ganoe D.O. on 02/19/2017 at 11:04 AM  Between 7am to 7pm - Pager  - [phone]  After 7pm go to [email or web address] - password [email or web address]  And look for the night coverage person covering for  me after hours  [facility] Office  [phone]

## 2017-02-20 DIAGNOSIS — Z992 Dependence on renal dialysis: Secondary | ICD-10-CM | POA: Diagnosis not present

## 2017-02-20 DIAGNOSIS — D649 Anemia, unspecified: Secondary | ICD-10-CM | POA: Diagnosis not present

## 2017-02-20 DIAGNOSIS — N184 Chronic kidney disease, stage 4 (severe): Secondary | ICD-10-CM | POA: Diagnosis not present

## 2017-02-20 DIAGNOSIS — E119 Type 2 diabetes mellitus without complications: Secondary | ICD-10-CM | POA: Diagnosis not present

## 2017-02-20 DIAGNOSIS — E1129 Type 2 diabetes mellitus with other diabetic kidney complication: Secondary | ICD-10-CM | POA: Diagnosis not present

## 2017-02-20 DIAGNOSIS — I5040 Unspecified combined systolic (congestive) and diastolic (congestive) heart failure: Secondary | ICD-10-CM | POA: Diagnosis not present

## 2017-02-20 DIAGNOSIS — N179 Acute kidney failure, unspecified: Secondary | ICD-10-CM | POA: Diagnosis not present

## 2017-02-20 DIAGNOSIS — N390 Urinary tract infection, site not specified: Secondary | ICD-10-CM | POA: Diagnosis not present

## 2017-02-20 DIAGNOSIS — I5023 Acute on chronic systolic (congestive) heart failure: Secondary | ICD-10-CM | POA: Diagnosis not present

## 2017-02-20 DIAGNOSIS — I160 Hypertensive urgency: Secondary | ICD-10-CM

## 2017-02-20 DIAGNOSIS — I129 Hypertensive chronic kidney disease with stage 1 through stage 4 chronic kidney disease, or unspecified chronic kidney disease: Secondary | ICD-10-CM | POA: Diagnosis not present

## 2017-02-20 DIAGNOSIS — H409 Unspecified glaucoma: Secondary | ICD-10-CM | POA: Diagnosis not present

## 2017-02-20 DIAGNOSIS — R262 Difficulty in walking, not elsewhere classified: Secondary | ICD-10-CM | POA: Diagnosis not present

## 2017-02-20 DIAGNOSIS — E43 Unspecified severe protein-calorie malnutrition: Secondary | ICD-10-CM | POA: Diagnosis not present

## 2017-02-20 DIAGNOSIS — I2510 Atherosclerotic heart disease of native coronary artery without angina pectoris: Secondary | ICD-10-CM | POA: Diagnosis not present

## 2017-02-20 DIAGNOSIS — I509 Heart failure, unspecified: Secondary | ICD-10-CM | POA: Diagnosis not present

## 2017-02-20 DIAGNOSIS — I10 Essential (primary) hypertension: Secondary | ICD-10-CM | POA: Diagnosis not present

## 2017-02-20 DIAGNOSIS — N189 Chronic kidney disease, unspecified: Secondary | ICD-10-CM | POA: Diagnosis not present

## 2017-02-20 DIAGNOSIS — R748 Abnormal levels of other serum enzymes: Secondary | ICD-10-CM | POA: Diagnosis not present

## 2017-02-20 DIAGNOSIS — E1122 Type 2 diabetes mellitus with diabetic chronic kidney disease: Secondary | ICD-10-CM | POA: Diagnosis not present

## 2017-02-20 DIAGNOSIS — D689 Coagulation defect, unspecified: Secondary | ICD-10-CM | POA: Diagnosis not present

## 2017-02-20 DIAGNOSIS — J9622 Acute and chronic respiratory failure with hypercapnia: Secondary | ICD-10-CM | POA: Diagnosis not present

## 2017-02-20 DIAGNOSIS — M25569 Pain in unspecified knee: Secondary | ICD-10-CM | POA: Diagnosis not present

## 2017-02-20 DIAGNOSIS — N186 End stage renal disease: Secondary | ICD-10-CM | POA: Diagnosis not present

## 2017-02-20 DIAGNOSIS — E0865 Diabetes mellitus due to underlying condition with hyperglycemia: Secondary | ICD-10-CM | POA: Diagnosis not present

## 2017-02-20 DIAGNOSIS — D509 Iron deficiency anemia, unspecified: Secondary | ICD-10-CM | POA: Diagnosis not present

## 2017-02-20 DIAGNOSIS — I5042 Chronic combined systolic (congestive) and diastolic (congestive) heart failure: Secondary | ICD-10-CM | POA: Diagnosis not present

## 2017-02-20 DIAGNOSIS — Z283 Underimmunization status: Secondary | ICD-10-CM | POA: Diagnosis not present

## 2017-02-20 DIAGNOSIS — E785 Hyperlipidemia, unspecified: Secondary | ICD-10-CM | POA: Diagnosis not present

## 2017-02-20 DIAGNOSIS — I5043 Acute on chronic combined systolic (congestive) and diastolic (congestive) heart failure: Secondary | ICD-10-CM | POA: Diagnosis not present

## 2017-02-20 DIAGNOSIS — T8249XD Other complication of vascular dialysis catheter, subsequent encounter: Secondary | ICD-10-CM | POA: Diagnosis not present

## 2017-02-20 DIAGNOSIS — N2581 Secondary hyperparathyroidism of renal origin: Secondary | ICD-10-CM | POA: Diagnosis not present

## 2017-02-20 DIAGNOSIS — M6281 Muscle weakness (generalized): Secondary | ICD-10-CM | POA: Diagnosis not present

## 2017-02-20 LAB — BASIC METABOLIC PANEL
Anion gap: 11 (ref 5–15)
BUN: 68 mg/dL — AB (ref 6–20)
CALCIUM: 9 mg/dL (ref 8.9–10.3)
CO2: 25 mmol/L (ref 22–32)
Chloride: 88 mmol/L — ABNORMAL LOW (ref 101–111)
Creatinine, Ser: 5.29 mg/dL — ABNORMAL HIGH (ref 0.44–1.00)
GFR calc Af Amer: 8 mL/min — ABNORMAL LOW (ref >60)
GFR, EST NON AFRICAN AMERICAN: 7 mL/min — AB (ref >60)
Glucose, Bld: 191 mg/dL — ABNORMAL HIGH (ref 65–99)
Potassium: 5.4 mmol/L — ABNORMAL HIGH (ref 3.5–5.1)
Sodium: 124 mmol/L — ABNORMAL LOW (ref 135–145)

## 2017-02-20 LAB — CBC
HCT: 32 % — ABNORMAL LOW (ref 36.0–46.0)
Hemoglobin: 9.6 g/dL — ABNORMAL LOW (ref 12.0–15.0)
MCH: 26.7 pg (ref 26.0–34.0)
MCHC: 30 g/dL (ref 30.0–36.0)
MCV: 89.1 fL (ref 78.0–100.0)
PLATELETS: 270 10*3/uL (ref 150–400)
RBC: 3.59 MIL/uL — ABNORMAL LOW (ref 3.87–5.11)
RDW: 14.6 % (ref 11.5–15.5)
WBC: 10.3 10*3/uL (ref 4.0–10.5)

## 2017-02-20 LAB — GLUCOSE, CAPILLARY
Glucose-Capillary: 105 mg/dL — ABNORMAL HIGH (ref 65–99)
Glucose-Capillary: 110 mg/dL — ABNORMAL HIGH (ref 65–99)

## 2017-02-20 MED ORDER — CALCITRIOL 0.5 MCG PO CAPS
ORAL_CAPSULE | ORAL | Status: AC
  Filled 2017-02-20: qty 1

## 2017-02-20 MED ORDER — POLYETHYLENE GLYCOL 3350 17 G PO PACK
17.0000 g | PACK | Freq: Every day | ORAL | 0 refills | Status: AC
Start: 2017-02-20 — End: ?

## 2017-02-20 MED ORDER — INSULIN DETEMIR 100 UNIT/ML ~~LOC~~ SOLN: 15.0000 [IU] | Freq: Every day | SUBCUTANEOUS | 11 refills | Status: DC

## 2017-02-20 MED ORDER — CALCITRIOL 0.5 MCG PO CAPS: 0.5000 ug | ORAL_CAPSULE | ORAL | Status: DC

## 2017-02-20 MED ORDER — ONDANSETRON HCL 4 MG/2ML IJ SOLN
INTRAMUSCULAR | Status: AC
  Filled 2017-02-20: qty 2

## 2017-02-20 MED ORDER — HYDRALAZINE HCL 25 MG PO TABS: 25.0000 mg | ORAL_TABLET | Freq: Three times a day (TID) | ORAL | 0 refills | Status: AC | PRN

## 2017-02-20 MED ORDER — RENA-VITE PO TABS: 1.0000 | ORAL_TABLET | Freq: Every day | ORAL | 0 refills | Status: DC

## 2017-02-20 MED ORDER — OXYCODONE-ACETAMINOPHEN 5-325 MG PO TABS: 1.0000 | ORAL_TABLET | ORAL | 0 refills | Status: DC | PRN

## 2017-02-20 MED ORDER — DARBEPOETIN ALFA 60 MCG/0.3ML IJ SOSY
PREFILLED_SYRINGE | INTRAMUSCULAR | Status: AC
  Administered 2017-02-20: 60 ug via INTRAVENOUS
  Filled 2017-02-20: qty 0.3

## 2017-02-20 NOTE — Progress Notes (Signed)
Admit: 02/06/2017 LOS: 31  Patient admitted 02/06/2017 with CKD, stage 4-5, DM, HTN, diastolic and systolic CHF, presented [facility] with shortness of breath, lower extremity edema and found to be in hypertensive urgency. She was started on IV lasix with inadequate volume control and worsening renal failure so nephrology and VSS were consulted for HD. Patient had RIJ cath placed, and L brachiocephalic AV fistula placed 02/10/2017. She is awaiting outpatient HD placement and will D/C to SNF when this is secured.   Subjective:  Patient seen during HD session this morning. Patient states she feels ok; she denies shortness of breath, chest pain, headache, nausea, vomiting. She feels her appetite is slowly improving but still not back to normal - denies abdominal pain, n/v with eating. She has no questions this morning.  07/15 0701 - 07/16 0700 In: 240 [P.O.:240] Out: 200 [Urine:200]  Filed Weights   02/19/17 0606 02/20/17 0500 02/20/17 0725  Weight: 179 lb 9.6 oz (81.5 kg) 172 lb 14.4 oz (78.4 kg) 183 lb 10.3 oz (83.3 kg)    Scheduled Meds: . aspirin EC  81 mg Oral Daily  . brimonidine  1 drop Right Eye BID   And  . timolol  1 drop Right Eye BID  . bupivacaine  10 mL Infiltration Once  . calcitRIOL  0.5 mcg Oral Q M,W,F-HD  . calcium acetate  667 mg Oral TID WC  . carvedilol  12.5 mg Oral BID WC  . darbepoetin (ARANESP) injection - DIALYSIS  60 mcg Intravenous Q Mon-HD  . feeding supplement (NEPRO CARB STEADY)  237 mL Oral BID BM  . heparin  5,000 Units Subcutaneous Q8H  . insulin aspart  0-9 Units Subcutaneous TID WC  . insulin detemir  15 Units Subcutaneous QHS  . isosorbide mononitrate  30 mg Oral Daily  . lidocaine  10 mL Infiltration Once  . methylPREDNISolone acetate  10 mg Intra-articular Once  . multivitamin  1 tablet Oral QHS  . polyethylene glycol  17 g Oral Daily   Continuous Infusions: . sodium chloride Stopped (02/15/17 0049)  . ferumoxytol Stopped (02/15/17 1221)   PRN  Meds:.acetaminophen **OR** acetaminophen, hydrALAZINE, meclizine, morphine injection, ondansetron (ZOFRAN) IV, oxyCODONE-acetaminophen  Current Labs: reviewed - Hgb stable, Na 124 and K 5.4 this AM prior to dialysis - last HD session was 7/11   Physical Exam:  Blood pressure (!) 115/55, pulse 62, temperature 98.1 F (36.7 C), temperature source Oral, resp. rate 16, height 5\' 3"  (1.6 m), weight 183 lb 10.3 oz (83.3 kg), SpO2 94 %.  Constitutional: NAD CV: RRR, no murmurs, rub or gallops appreciated, pulses intact, extremities warm with mild pitting edema, L fistula with palpable thrill, well healing incision; HD cath in place on ant right chest - surrounding area not erythematous Resp: no increased work of breathing, no rales or wheezes on anterior chest Abd: +BS, nontender  A 1. ESRD on HD 2/2 HTN, DM and possibly cardiorenal syndrome 2. Anemia  3. Secondary hyperparathyroidism 4. Diastolic and systolic CHF 5. HTN 6. DM  P 1. Has been receiving HD inpt MWF schedule - awaiting outpatient HD placement then DC to SNF. Currently using RIJ cath; has left brachiocephalic AV fistula and will follow up with Dr. Oneida Alar with VSS in about 6wks. CLIP TTS - can start tomorrow if discharged today to SNF - stable from renal standpoint. 2. On aranesp. Hgb stable at 9.6 today. 3. On Calcitriol 0.5mg  TID, Phoslo 667mg  TID 4. Echo 02/07/2017 - EF 45-50%, diffuse hypokinesis,  G1DD - ASA, carvedilol, imdur 5. 116/56 this AM; continue home carvedilol 12.5mg  BID and imdur 30mg  daily - has PRN hydral 6. Levemir 20 units qhs, SSI   Alphonzo Grieve, MD PGY2 02/20/2017, 8:40 AM   Recent Labs Lab 02/15/17 0417  02/17/17 0304 02/17/17 0730 02/18/17 0454 02/20/17 0416  NA 130*  < > 127*  --  129* 124*  K 4.5  < > 4.7  --  4.4 5.4*  CL 95*  < > 91*  --  93* 88*  CO2 26  < > 27  --  25 25  GLUCOSE 176*  < > 218*  --  187* 191*  BUN 24*  < > 32*  --  26* 68*  CREATININE 4.61*  < > 5.25*  --  3.41* 5.29*   CALCIUM 8.7*  < > 9.3 9.0 9.0 9.0  PHOS 5.5*  --   --   --   --   --   < > = values in this interval not displayed.  Recent Labs Lab 02/17/17 0304 02/18/17 0454 02/20/17 0416  WBC 10.3 9.9 10.3  HGB 9.0* 9.4* 9.6*  HCT 31.6* 31.9* 32.0*  MCV 90.8 90.6 89.1  PLT 267 234 270

## 2017-02-20 NOTE — Progress Notes (Signed)
Accepted at Short 1st treatment is :Tuesday 02/21/17 at 10:45am .Tentative schedule AI:DKSM,MOCAR,EQJ   Chairtime at 12:10pm

## 2017-02-20 NOTE — Progress Notes (Signed)
PT Cancellation Note  Patient Details Name: Kathleen [name] MRN: [id number] DOB: November 29, 1945   Cancelled Treatment:    Reason Eval/Treat Not Completed: Patient at procedure or test/unavailable   Currently in HD;   Will follow up later today as time allows;  Otherwise, will follow up for PT tomorrow;   Thank you,  Roney Marion, PT  Acute Rehabilitation Services Pager [phone] Office [place] 02/20/2017, 8:15 AM

## 2017-02-20 NOTE — Discharge Instructions (Signed)
End-Stage Kidney Disease End-stage kidney disease occurs when the kidneys are so damaged that they cannot do their job. The kidneys are two organs that do many important jobs in the body, which include:  Removing wastes and extra fluids from the blood.  Making hormones that maintain the amount of fluid in your tissues and blood vessels.  Maintaining the right amount of fluids and chemicals in the body.  When the kidneys are damaged and cannot do their job, life-threatening problems occur. Without the help of the kidneys, toxins build up in the blood. In end-stage kidney disease, the kidneys cannot get better. What are the causes? End-stage kidney disease usually occurs when a long-lasting (chronic) kidney disease gets worse. It may also occur after the kidneys are suddenly damaged (acute kidney injury). What increases the risk? This condition is more likely to develop in people who are:  Older than age 46.  Female.  Of African-American descent.  Current smokers or former smokers.  Obese.  You may also have an increased risk for end-stage kidney disease if you:  Have a family history of chronic kidney disease (CKD).  Have had kidney disease for many years.  Have other longstanding medical conditions that affect the kidneys, such as: ? Cardiovascular disease including high blood pressure. ? Diabetes. ? Certain diseases that affect the immune system.  What are the signs or symptoms?  Swelling (edema) of the face, legs, ankles, or feet.  Numbness, tingling, or loss of feeling (sensation) in your hands or feet.  Tiredness (lethargy).  Nausea or vomiting.  Confusion, trouble concentrating, or loss of consciousness.  Chest pain.  Shortness of breath.  Little to no urine production.  Muscle twitches and cramps, especially in the legs.  Constant itchiness.  Loss of appetite.  Pale skin and tissue lining your eyelids (conjunctiva).  Headaches.  Abnormally dark or  light skin.  Decrease in muscle size (muscle wasting).  Easy bruising.  Frequent hiccups.  Stopping of menstruation in women.  Seizures. How is this diagnosed? Your health care provider will measure your blood pressure and do some tests. These may include:  Urine tests.  Blood tests.  Imaging tests.  A test in which a sample of tissue is removed from the kidneys to be looked at under a microscope (kidney biopsy).  How is this treated? There are two treatments for end-stage kidney disease:  A procedure that removes toxic wastes from the body (dialysis). Depending on the type of dialysis you choose, it may be performed more than one time a day (peritoneal dialysis) or several times a week (hemodialysis).  Surgery toreceive a new kidney (kidney transplant).  In addition to having dialysis or a kidney transplant, you may need to take medicines:  To control high blood pressure (hypertension).  To control cholesterol.  To maintain healthy electrolyte levels in your blood.  You may also be given a specific diet to follow that includes requirements or limits for:  Salt (sodium).  Protein.  Phosphorous.  Potassium.  Calcium.  Follow these instructions at home:  Follow your prescribed diet.  Take over-the-counter and prescription medicines only as told by your health care provider. ? Do not take any new medicines unless approved by your health care provider. Many medicines can worsen your kidney damage. ? Do not take any vitamin and mineral supplements unless approved by your health care provider. Many nutritional supplements can worsen your kidney damage. ? The dose of some medicines that you take may need to be  adjusted.  Do not use any tobacco products, such as cigarettes, chewing tobacco, and e-cigarettes. If you need help quitting, ask your health care provider.  Keep all follow-up visits as told by your health care provider. This is important.  Keep track of  your blood pressure. Report changes in your blood pressure as told by your health care provider.  Achieve and maintain a healthy weight. If you need help with this, ask your health care provider.  Start or continue an exercise plan. Try to exercise at least 30 minutes a day, 5 days a week.  Stay current with immunizations as told by your health care provider. Where to find more information:  American Association of Kidney Patients: BombTimer.gl  National Kidney Foundation: www.kidney.[place]: https://mathis.com/  [employer] [facility]: www.lifeoptions.org and www.kidneyschool.org Contact a health care provider if:  Your symptoms get worse.  You develop new symptoms. Get help right away if:  You have weakness in an arm or leg on one side of your body.  You have difficulty speaking or you are slurring your speech.  You have a sudden change in your vision.  You have a sudden, severe headache.  You have a sudden weight increase.  You have difficulty breathing.  Your symptoms suddenly get worse. This information is not intended to replace advice given to you by your health care provider. Make sure you discuss any questions you have with your health care provider. Document Released: 10/15/2003 Document Revised: 12/31/2015 Document Reviewed: 03/23/2012 Elsevier Interactive Patient Education  2017 [place].   Heart Failure Heart failure means your heart has trouble pumping blood. This makes it hard for your body to work well. Heart failure is usually a long-term (chronic) condition. You must take good care of yourself and follow your doctor's treatment plan. Follow these instructions at home:  Take your heart medicine as told by your doctor. ? Do not stop taking medicine unless your doctor tells you to. ? Do not skip any dose of medicine. ? Refill your medicines before they run out. ? Take other medicines only as told by your doctor or  pharmacist.  Stay active if told by your doctor. The elderly and people with severe heart failure should talk with a doctor about physical activity.  Eat heart-healthy foods. Choose foods that are without trans fat and are low in saturated fat, cholesterol, and salt (sodium). This includes fresh or frozen fruits and vegetables, fish, lean meats, fat-free or low-fat dairy foods, whole grains, and high-fiber foods. Lentils and dried peas and beans (legumes) are also good choices.  Limit salt if told by your doctor.  Cook in a healthy way. Roast, grill, broil, bake, poach, steam, or stir-fry foods.  Limit fluids as told by your doctor.  Weigh yourself every morning. Do this after you pee (urinate) and before you eat breakfast. Write down your weight to give to your doctor.  Take your blood pressure and write it down if your doctor tells you to.  Ask your doctor how to check your pulse. Check your pulse as told.  Lose weight if told by your doctor.  Stop smoking or chewing tobacco. Do not use gum or patches that help you quit without your doctor's approval.  Schedule and go to doctor visits as told.  Nonpregnant women should have no more than 1 drink a day. Men should have no more than 2 drinks a day. Talk to your doctor about drinking alcohol.  Stop illegal drug  use.  Stay current with shots (immunizations).  Manage your health conditions as told by your doctor.  Learn to manage your stress.  Rest when you are tired.  If it is really hot outside: ? Avoid intense activities. ? Use air conditioning or fans, or get in a cooler place. ? Avoid caffeine and alcohol. ? Wear loose-fitting, lightweight, and light-colored clothing.  If it is really cold outside: ? Avoid intense activities. ? Layer your clothing. ? Wear mittens or gloves, a hat, and a scarf when going outside. ? Avoid alcohol.  Learn about heart failure and get support as needed.  Get help to maintain or improve  your quality of life and your ability to care for yourself as needed. Contact a doctor if:  You gain weight quickly.  You are more short of breath than usual.  You cannot do your normal activities.  You tire easily.  You cough more than normal, especially with activity.  You have any or more puffiness (swelling) in areas such as your hands, feet, ankles, or belly (abdomen).  You cannot sleep because it is hard to breathe.  You feel like your heart is beating fast (palpitations).  You get dizzy or light-headed when you stand up. Get help right away if:  You have trouble breathing.  There is a change in mental status, such as becoming less alert or not being able to focus.  You have chest pain or discomfort.  You faint. This information is not intended to replace advice given to you by your health care provider. Make sure you discuss any questions you have with your health care provider. Document Released: 05/03/2008 Document Revised: 12/31/2015 Document Reviewed: 09/10/2012 Elsevier Interactive Patient Education  2017 [employer].

## 2017-02-20 NOTE — Clinical Social Work Note (Signed)
CSW facilitated patient discharge including contacting patient family and facility to confirm patient discharge plans. Clinical information faxed to facility and family agreeable with plan. CSW arranged ambulance transport via [facility] to [facility]. RN to call report prior to discharge ([phone]).  CSW will sign off for now as social work intervention is no longer needed. Please consult us again if new needs arise.  Khadeejah Castner, CSW [phone]   

## 2017-02-20 NOTE — Clinical Social Work Note (Addendum)
CSW worked with patient to complete her half of the SCAT application. Will page MD regarding the part the MD has to fill out. Patient stated her son might be able to take her to dialysis tomorrow. CSW left him a voicemail.  Dayton Scrape, CSW [phone]  1:15 pm MD completed her portion of the SCAT application. CSW faxed to [facility]. Waiting on confirmation from SNF that they are ready to accept her. Will paged MD when everything ready.  Dayton Scrape, [place] [phone]  3:01 pm SNF is trying to see if [facility] will be able to transport patient to dialysis tomorrow as they usually need 24-hour notice. CSW discussed with patient's son who stated that he is working all week but if [facility] is unable to transport her he will so.  Dayton Scrape, [place] [phone]  3:57 pm [facility] unable to transport patient to HD tomorrow but did receive her application and will process through since she is a dialysis patient. Patient's son notified and can transport her to dialysis tomorrow morning. Address and appointment time given.  Dayton Scrape, [place]

## 2017-02-20 NOTE — Discharge Summary (Signed)
Physician Discharge Summary  Kathleen [name] ZOX:[id number] DOB: 1945/11/02 DOA: 02/06/2017  PCP: Benito Mccreedy, MD  Admit date: 02/06/2017 Discharge date: 02/20/2017  Time spent: 45 minutes  Recommendations for Outpatient Follow-up:  Patient will be discharged to [facility].  Continue physical and occupational therapy. Patient will need to follow up with primary care provider within one week of discharge.  Continue hemodialysis as scheduled. Follow up with vascular surgery, Dr. Oneida Alar, in 6 weeks. Patient should continue medications as prescribed.  Patient should follow a renal diet with 1231ml fluid restriction per day.   Discharge Diagnoses:  Acute on chronic combined systolic and diastolic heart failure New ESRD/Acute kidney kidney injury and CAD stage IV Hyperkalemia/hyponatremia Hypertensive urgency Hypotension with dialysis Anemia of chronic disease Diabetes mellitus, type II Malnutrition Coronary artery disease Elevated troponins Escherichia coli UTI Bilateral knee pain  Discharge Condition: Stable  Diet recommendation: renal diet with 1269ml fluid restriction per day  Filed Weights   02/20/17 0500 02/20/17 0725 02/20/17 1131  Weight: 78.4 kg (172 lb 14.4 oz) 83.3 kg (183 lb 10.3 oz) 81.3 kg (179 lb 3.7 oz)    History of present illness:  On 02/07/2017 by Dr. Loretha Brasil McLeanis a 71 y.o.femalewith history of chronic kidney disease stage IV to 5, chronic combined systolic and diastolic CHF posterior fourchette was 40-45% with grade 1 diastolic dysfunction in December 2016 1 diabetes mellitus, anemia presents to the ER with increasing shortness of breath and lower extremity edema. Patient states 2 weeks ago patient's nephrologist increased her Lasix dose from once a day to 3 times a day. Patient was unable to take the dose because she was unable to ambulate for frequent urination. She was only taking twice a day. Patient's shortness of breath is  mostly on lying down. Denies any chest pain or productive cough.  Hospital Course:  Acute on chronic combined systolic and diastolic heart failure -Chest x-ray on admission showed mild CHF -BNP 463.4 -Echocardiogram showed EF of 45%, diffuse hypokinesis, grade 1 diastolic dysfunction -Patient was placed on IV Lasix, 120 mg twice a day however with little diuresis. Creatinine began to worsen. -Nephrology consult appreciated, initiated hemodialysis -Vascular surgery consulted, patient had vein mapping. AV fistula placed on 02/10/2017 (will need follow-up with Dr. Oneida Alar in 6 weeks) -Continue volume control with HD  -Currently appears stable  New ESRD/Acute kidney kidney injury and CAD stage IV -? Cardiorenal syndrome -As stated above, patient was on diuresis however creatinine worsened and HD initiated -Creatinine peaked at 5.05 -patient received outpatient HD slot at [facility], and is supposed to be there 02/21/17 at 10:45am (appts are TTS 12:10) -Completed SCAT form to help patient with transportation. Discussed with social work, per SNF- they will be able to provide patient with vouchers initially until SCAT service is in place.   Hyperkalemia/hyponatremia -Managed with HD  Hypertensive urgency -Resolved  Hypotension with dialysis -hydralazine discontinued and to be used PRN for hypertension -BP has remained stable   Anemia of chronic disease -Hemoglobin currently 9.6 -Continue Aranesp  Diabetes mellitus, type II -Continue Levemir, insulin sliding scale and CBG monitoring  Malnutrition -Nutrition consulted and recommended snacks between meals  Coronary artery disease -No complaints of chest pain, continue aspirin, Coreg  Elevated troponins -Likely secondary to demand ischemia from heart failure  Escherichia coli UTI -Was placed on keflex, completed course  Bilateral knee pain -Suspect is secondary to arthritis -X-rays obtained, negative for  effusion -Orthopedics consulted and appreciated, status post right knee  aspiration and injection with mixture of lidocaine, Marcaine, Depo-Medrol -PT consulted recommended SNF -patient may follow up with orthopedics as needed (Dr. Percell Miller)  Consultants Nephrology Vascular surgery Orthopedics   Procedures  Echocardiogram Vein mapping Right IJ HD catheter placed Left brachiocephalic AV fistula Right knee aspiration and injection  Discharge Exam: Vitals:   02/20/17 1131 02/20/17 1211  BP: (!) 156/61 (!) 126/48  Pulse: 74 72  Resp: 16 16  Temp: 98.3 F (36.8 C) 98 F (36.7 C)   Patient feeling better today. Currently denies any chest pain, short of breath, abdominal pain, nausea vomiting, diarrhea constipation, dizziness or headache. Feels knee pain has improved.   General: Well developed, well nourished, NAD, appears stated age  HEENT: NCAT, mucous membranes moist.  Cardiovascular: S1 S2 auscultated, 2/6SEM, RRR.  Respiratory: Clear to auscultation bilaterally with equal chest rise. No wheezing  Abdomen: Soft, nontender, nondistended, + bowel sounds  Extremities: warm dry without cyanosis clubbing or edema  Neuro: AAOx3, nonfocal  Skin: Without rashes exudates or nodules  Psych: appropriately now affect, pleasant  Discharge Instructions Discharge Instructions    Discharge instructions    Complete by:  As directed    Patient will be discharged to [facility]  Continue physical and occupational therapy. Patient will need to follow up with primary care provider within one week of discharge.  Continue hemodialysis as scheduled. Follow up with vascular surgery, Dr. Oneida Alar, in 6 weeks. Patient should continue medications as prescribed.  Patient should follow a renal diet with 1256ml fluid restriction per day.     Current Discharge Medication List    START taking these medications   Details  multivitamin (RENA-VIT) TABS tablet Take 1 tablet by mouth at  bedtime. Qty: 30 tablet, Refills: 0    oxyCODONE-acetaminophen (PERCOCET/ROXICET) 5-325 MG tablet Take 1 tablet by mouth every 4 (four) hours as needed for moderate pain. Qty: 20 tablet, Refills: 0    polyethylene glycol (MIRALAX / GLYCOLAX) packet Take 17 g by mouth daily. Qty: 14 each, Refills: 0      CONTINUE these medications which have CHANGED   Details  calcitRIOL (ROCALTROL) 0.5 MCG capsule Take 1 capsule (0.5 mcg total) by mouth Every Tuesday,Thursday,and Saturday with dialysis.    hydrALAZINE (APRESOLINE) 25 MG tablet Take 1 tablet (25 mg total) by mouth every 8 (eight) hours as needed (SBP > 170  or DBP > 110). Qty: 90 tablet, Refills: 0    insulin detemir (LEVEMIR) 100 UNIT/ML injection Inject 0.15 mLs (15 Units total) into the skin at bedtime. Qty: 10 mL, Refills: 11      CONTINUE these medications which have NOT CHANGED   Details  aspirin 81 MG EC tablet Take 1 tablet (81 mg total) by mouth daily. Qty: 30 tablet, Refills: 3    brimonidine-timolol (COMBIGAN) 0.2-0.5 % ophthalmic solution Place 1 drop into the right eye 2 (two) times daily as needed (for glaucoma).     calcium acetate (PHOSLO) 667 MG capsule Take 667 mg by mouth 3 (three) times daily with meals.    carvedilol (COREG) 12.5 MG tablet Take 12.5 mg by mouth 2 (two) times daily with a meal.  Refills: 8    insulin aspart (NOVOLOG) 100 UNIT/ML injection Inject 6 Units into the skin 3 (three) times daily with meals. CBG < 70: implement hypoglycemia protocol CBG 70 - 120: 0 units CBG 121 - 150: 1 unit CBG 151 - 200: 2 units CBG 201 - 250: 3 units CBG 251 - 300: 5  units CBG 301 - 350: 7 units CBG 351 - 400: 9 units CBG > 400: call MD. Otho Darner: 10 mL, Refills: 11    isosorbide mononitrate (IMDUR) 30 MG 24 hr tablet Take 1 tablet (30 mg total) by mouth daily. Qty: 90 tablet, Refills: 2    meclizine (ANTIVERT) 12.5 MG tablet Take 1 tablet (12.5 mg total) by mouth 3 (three) times daily as needed for dizziness  or nausea. For dizzy spells Qty: 30 tablet, Refills: 0    Nutritional Supplements (FEEDING SUPPLEMENT, NEPRO CARB STEADY,) LIQD Take 237 mLs by mouth 2 (two) times daily between meals. Qty: 60 Can, Refills: 0      STOP taking these medications     furosemide (LASIX) 40 MG tablet      furosemide (LASIX) 80 MG tablet        No Known Allergies  Contact information for follow-up providers    Elam Dutch, MD Follow up in 6 week(s).   Specialties:  Vascular Surgery, Cardiology Why:  office will call for appt. Contact information: [address] [place] [address] [phone]        Benito Mccreedy, MD. Schedule an appointment as soon as possible for a visit in 1 week(s).   Specialty:  Internal Medicine Why:  Hospital follow up Contact information: [address] [place] [address] [phone]        Renette Butters, MD. Call.   Specialty:  Orthopedic Surgery Why:  As needed for knee pain Contact information: [address] [place] [address] [phone]            Contact information for after-discharge care    [facility] Follow up.   Specialty:  [place] information: [address] [place] [phone]                   The results of significant diagnostics from this hospitalization (including imaging, microbiology, ancillary and laboratory) are listed below for reference.    Significant Diagnostic Studies: Dg Chest 2 View  Result Date: 02/06/2017 CLINICAL DATA:  Shortness of breath and nausea for 1 week EXAM: CHEST  2 VIEW COMPARISON:  August 06, 2015 FINDINGS: The mediastinal contour is normal heart size is enlarged the aorta is tortuous. There is mild increased pulmonary interstitium bilaterally. There is no focal pneumonia. There are small bilateral pleural effusions. The visualized skeletal structures are  stable. IMPRESSION: Mild congestive heart failure. Electronically Signed   By: Abelardo Diesel M.D.   On: 02/06/2017 18:32   US Renal  Result Date: 02/08/2017 CLINICAL DATA:  Acute renal failure. EXAM: RENAL / URINARY TRACT ULTRASOUND COMPLETE COMPARISON:  09/05/2013 and CT 12/22/2015 FINDINGS: Exam somewhat limited due to patient body habitus. Right Kidney: Length: 10.4 cm. Moderate increased cortical echogenicity with mild cortical thinning. 1.8 cm cyst over the lower pole. No mass or hydronephrosis visualized. Left Kidney: Length: 9.2 cm. Moderate increased cortical echogenicity. No mass or hydronephrosis visualized. Bladder: Appears normal for degree of bladder distention. IMPRESSION: Normal size kidneys with increased cortical echogenicity which can be seen with medical renal disease. No hydronephrosis. 1.8 cm right renal cyst. Electronically Signed   By: Marin Olp M.D.   On: 02/08/2017 14:14   Dg Chest Port 1 View  Result Date: 02/10/2017 CLINICAL DATA:  Central line placement. EXAM: PORTABLE CHEST 1 VIEW COMPARISON:  02/06/2017 FINDINGS: Large bore right internal jugular approach  central venous catheter terminates in the expected location of the right atrium. The cardiac silhouette is enlarged. Mediastinal contours appear intact. There is no evidence of focal airspace consolidation, pleural effusion or pneumothorax. Interstitial pulmonary edema. Osseous structures are without acute abnormality. Soft tissues are grossly normal. IMPRESSION: Right internal jugular approach central venous catheter terminates in the expected location of the right atrium. Enlarged cardiac silhouette with interstitial pulmonary edema. These results will be called to the ordering clinician or representative by the Radiologist Assistant, and communication documented in the PACS or zVision Dashboard. Electronically Signed   By: Fidela Salisbury M.D.   On: 02/10/2017 15:26   Dg Knee Complete 4 Views Left  Result Date:  02/14/2017 CLINICAL DATA:  Bilateral knee pain for 2 months, worsening EXAM: LEFT KNEE - COMPLETE 4+ VIEW COMPARISON:  06/10/2014 FINDINGS: Tricompartmental osteoarthritis with advanced lateral compartment narrowing and degenerative spurring. Prominent patellofemoral compartment spurring as well. Sclerosis below the anterior tibial plateau is chronic and stable from prior, likely subchondral cyst and reactive sclerosis. Atherosclerosis and nonspecific soft tissue reticulation. Osteopenia. IMPRESSION: 1. No acute finding. 2. Tricompartmental osteoarthritis, particularly advanced in the lateral compartment. Electronically Signed   By: Monte Fantasia M.D.   On: 02/14/2017 16:38   Dg Knee Complete 4 Views Right  Result Date: 02/14/2017 CLINICAL DATA:  Knee pain, bilateral for 2 months EXAM: RIGHT KNEE - COMPLETE 4+ VIEW COMPARISON:  None. FINDINGS: Tricompartmental osteoarthritis with marginal spurring greatest at the lateral compartment. Possible small joint effusion. No acute fracture or malalignment. IMPRESSION: 1. No acute osseous finding. 2. Osteoarthritis, advanced at the lateral compartment. 3. Osteopenia. Electronically Signed   By: Monte Fantasia M.D.   On: 02/14/2017 16:51   Dg Fluoro Guide Cv Line-no Report  Result Date: 02/10/2017 Fluoroscopy was utilized by the requesting physician.  No radiographic interpretation.    Microbiology: No results found for this or any previous visit (from the past 240 hour(s)).   Labs: Basic Metabolic Panel:  Recent Labs Lab 02/15/17 0417 02/16/17 0730 02/17/17 0304 02/17/17 0730 02/18/17 0454 02/20/17 0416  NA 130* 131* 127*  --  129* 124*  K 4.5 4.5 4.7  --  4.4 5.4*  CL 95* 95* 91*  --  93* 88*  CO2 26 26 27   --  25 25  GLUCOSE 176* 198* 218*  --  187* 191*  BUN 24* 17 32*  --  26* 68*  CREATININE 4.61* 3.96* 5.25*  --  3.41* 5.29*  CALCIUM 8.7* 8.8* 9.3 9.0 9.0 9.0  PHOS 5.5*  --   --   --   --   --    Liver Function Tests:  Recent  Labs Lab 02/15/17 0417  ALBUMIN 2.7*   No results for input(s): LIPASE, AMYLASE in the last 168 hours. No results for input(s): AMMONIA in the last 168 hours. CBC:  Recent Labs Lab 02/15/17 0417 02/16/17 0730 02/17/17 0304 02/18/17 0454 02/20/17 0416  WBC 7.1 6.5 10.3 9.9 10.3  HGB 8.8* 9.4* 9.0* 9.4* 9.6*  HCT 29.4* 32.4* 31.6* 31.9* 32.0*  MCV 91.3 92.3 90.8 90.6 89.1  PLT 292 243 267 234 270   Cardiac Enzymes: No results for input(s): CKTOTAL, CKMB, CKMBINDEX, TROPONINI in the last 168 hours. BNP: BNP (last 3 results)  Recent Labs  02/07/17 0029  BNP 463.4*    ProBNP (last 3 results) No results for input(s): PROBNP in the last 8760 hours.  CBG:  Recent Labs Lab 02/19/17 1104 02/19/17 1653 02/19/17 2141 02/20/17  1031 02/20/17 1205  GLUCAP 228* 189* 228* 105* 110*       Signed:  Lavaughn Bisig  [facility] 02/20/2017, 1:28 PM

## 2017-02-20 NOTE — Clinical Social Work Placement (Signed)
   CLINICAL SOCIAL WORK PLACEMENT  NOTE  Date:  02/20/2017  Patient Details  Name: Kathleen [name] MRN: [id number] Date of Birth: Feb 19, 1946  Clinical Social Work is seeking post-discharge placement for this patient at the [place] level of care (*CSW will initial, date and re-position this form in  chart as items are completed):  Yes   Patient/family provided with [place] Work Department's list of facilities offering this level of care within the geographic area requested by the patient (or if unable, by the patient's family).  Yes   Patient/family informed of their freedom to choose among providers that offer the needed level of care, that participate in Medicare, Medicaid or managed care program needed by the patient, have an available bed and are willing to accept the patient.  Yes   Patient/family informed of [place]'s ownership interest in [facility] and [facility], as well as of the fact that they are under no obligation to receive care at these facilities.  PASRR submitted to EDS on 02/14/17     PASRR number received on       Existing PASRR number confirmed on 02/14/17     FL2 transmitted to all facilities in geographic area requested by pt/family on 02/14/17     FL2 transmitted to all facilities within larger geographic area on       Patient informed that his/her managed care company has contracts with or will negotiate with certain facilities, including the following:        Yes   Patient/family informed of bed offers received.  Patient chooses bed at [facility]     Physician recommends and patient chooses bed at      Patient to be transferred to [facility] on 02/20/17.  Patient to be transferred to facility by [facility]     Patient family notified on 02/20/17 of transfer.  Name of family member notified:  [name]     PHYSICIAN       Additional Comment:     _______________________________________________ Candie Chroman, LCSW 02/20/2017, 3:58 PM

## 2017-02-21 DIAGNOSIS — N2581 Secondary hyperparathyroidism of renal origin: Secondary | ICD-10-CM | POA: Diagnosis not present

## 2017-02-21 DIAGNOSIS — N186 End stage renal disease: Secondary | ICD-10-CM | POA: Diagnosis not present

## 2017-02-21 DIAGNOSIS — T8249XD Other complication of vascular dialysis catheter, subsequent encounter: Secondary | ICD-10-CM | POA: Diagnosis not present

## 2017-02-21 DIAGNOSIS — D509 Iron deficiency anemia, unspecified: Secondary | ICD-10-CM | POA: Diagnosis not present

## 2017-02-21 DIAGNOSIS — Z992 Dependence on renal dialysis: Secondary | ICD-10-CM | POA: Diagnosis not present

## 2017-02-21 DIAGNOSIS — D689 Coagulation defect, unspecified: Secondary | ICD-10-CM | POA: Diagnosis not present

## 2017-02-23 DIAGNOSIS — T8249XD Other complication of vascular dialysis catheter, subsequent encounter: Secondary | ICD-10-CM | POA: Diagnosis not present

## 2017-02-23 DIAGNOSIS — D689 Coagulation defect, unspecified: Secondary | ICD-10-CM | POA: Diagnosis not present

## 2017-02-23 DIAGNOSIS — N186 End stage renal disease: Secondary | ICD-10-CM | POA: Diagnosis not present

## 2017-02-23 DIAGNOSIS — Z992 Dependence on renal dialysis: Secondary | ICD-10-CM | POA: Diagnosis not present

## 2017-02-23 DIAGNOSIS — D509 Iron deficiency anemia, unspecified: Secondary | ICD-10-CM | POA: Diagnosis not present

## 2017-02-23 DIAGNOSIS — N2581 Secondary hyperparathyroidism of renal origin: Secondary | ICD-10-CM | POA: Diagnosis not present

## 2017-02-25 DIAGNOSIS — T8249XD Other complication of vascular dialysis catheter, subsequent encounter: Secondary | ICD-10-CM | POA: Diagnosis not present

## 2017-02-25 DIAGNOSIS — D509 Iron deficiency anemia, unspecified: Secondary | ICD-10-CM | POA: Diagnosis not present

## 2017-02-25 DIAGNOSIS — Z992 Dependence on renal dialysis: Secondary | ICD-10-CM | POA: Diagnosis not present

## 2017-02-25 DIAGNOSIS — N186 End stage renal disease: Secondary | ICD-10-CM | POA: Diagnosis not present

## 2017-02-25 DIAGNOSIS — N2581 Secondary hyperparathyroidism of renal origin: Secondary | ICD-10-CM | POA: Diagnosis not present

## 2017-02-25 DIAGNOSIS — D689 Coagulation defect, unspecified: Secondary | ICD-10-CM | POA: Diagnosis not present

## 2017-02-28 DIAGNOSIS — D509 Iron deficiency anemia, unspecified: Secondary | ICD-10-CM | POA: Diagnosis not present

## 2017-02-28 DIAGNOSIS — T8249XD Other complication of vascular dialysis catheter, subsequent encounter: Secondary | ICD-10-CM | POA: Diagnosis not present

## 2017-02-28 DIAGNOSIS — N186 End stage renal disease: Secondary | ICD-10-CM | POA: Diagnosis not present

## 2017-02-28 DIAGNOSIS — D689 Coagulation defect, unspecified: Secondary | ICD-10-CM | POA: Diagnosis not present

## 2017-02-28 DIAGNOSIS — N2581 Secondary hyperparathyroidism of renal origin: Secondary | ICD-10-CM | POA: Diagnosis not present

## 2017-02-28 DIAGNOSIS — Z992 Dependence on renal dialysis: Secondary | ICD-10-CM | POA: Diagnosis not present

## 2017-03-02 DIAGNOSIS — T8249XD Other complication of vascular dialysis catheter, subsequent encounter: Secondary | ICD-10-CM | POA: Diagnosis not present

## 2017-03-02 DIAGNOSIS — N2581 Secondary hyperparathyroidism of renal origin: Secondary | ICD-10-CM | POA: Diagnosis not present

## 2017-03-02 DIAGNOSIS — D689 Coagulation defect, unspecified: Secondary | ICD-10-CM | POA: Diagnosis not present

## 2017-03-02 DIAGNOSIS — Z992 Dependence on renal dialysis: Secondary | ICD-10-CM | POA: Diagnosis not present

## 2017-03-02 DIAGNOSIS — D509 Iron deficiency anemia, unspecified: Secondary | ICD-10-CM | POA: Diagnosis not present

## 2017-03-02 DIAGNOSIS — N186 End stage renal disease: Secondary | ICD-10-CM | POA: Diagnosis not present

## 2017-03-04 DIAGNOSIS — N186 End stage renal disease: Secondary | ICD-10-CM | POA: Diagnosis not present

## 2017-03-04 DIAGNOSIS — T8249XD Other complication of vascular dialysis catheter, subsequent encounter: Secondary | ICD-10-CM | POA: Diagnosis not present

## 2017-03-04 DIAGNOSIS — D509 Iron deficiency anemia, unspecified: Secondary | ICD-10-CM | POA: Diagnosis not present

## 2017-03-04 DIAGNOSIS — Z992 Dependence on renal dialysis: Secondary | ICD-10-CM | POA: Diagnosis not present

## 2017-03-04 DIAGNOSIS — D689 Coagulation defect, unspecified: Secondary | ICD-10-CM | POA: Diagnosis not present

## 2017-03-04 DIAGNOSIS — N2581 Secondary hyperparathyroidism of renal origin: Secondary | ICD-10-CM | POA: Diagnosis not present

## 2017-03-07 DIAGNOSIS — N2581 Secondary hyperparathyroidism of renal origin: Secondary | ICD-10-CM | POA: Diagnosis not present

## 2017-03-07 DIAGNOSIS — T8249XD Other complication of vascular dialysis catheter, subsequent encounter: Secondary | ICD-10-CM | POA: Diagnosis not present

## 2017-03-07 DIAGNOSIS — D509 Iron deficiency anemia, unspecified: Secondary | ICD-10-CM | POA: Diagnosis not present

## 2017-03-07 DIAGNOSIS — E1122 Type 2 diabetes mellitus with diabetic chronic kidney disease: Secondary | ICD-10-CM | POA: Diagnosis not present

## 2017-03-07 DIAGNOSIS — Z992 Dependence on renal dialysis: Secondary | ICD-10-CM | POA: Diagnosis not present

## 2017-03-07 DIAGNOSIS — N186 End stage renal disease: Secondary | ICD-10-CM | POA: Diagnosis not present

## 2017-03-07 DIAGNOSIS — D689 Coagulation defect, unspecified: Secondary | ICD-10-CM | POA: Diagnosis not present

## 2017-03-08 DIAGNOSIS — M6281 Muscle weakness (generalized): Secondary | ICD-10-CM | POA: Diagnosis not present

## 2017-03-08 DIAGNOSIS — N184 Chronic kidney disease, stage 4 (severe): Secondary | ICD-10-CM | POA: Diagnosis not present

## 2017-03-08 DIAGNOSIS — I10 Essential (primary) hypertension: Secondary | ICD-10-CM | POA: Diagnosis not present

## 2017-03-08 DIAGNOSIS — E119 Type 2 diabetes mellitus without complications: Secondary | ICD-10-CM | POA: Diagnosis not present

## 2017-03-08 DIAGNOSIS — R262 Difficulty in walking, not elsewhere classified: Secondary | ICD-10-CM | POA: Diagnosis not present

## 2017-03-09 DIAGNOSIS — D689 Coagulation defect, unspecified: Secondary | ICD-10-CM | POA: Diagnosis not present

## 2017-03-09 DIAGNOSIS — Z283 Underimmunization status: Secondary | ICD-10-CM | POA: Diagnosis not present

## 2017-03-09 DIAGNOSIS — E1129 Type 2 diabetes mellitus with other diabetic kidney complication: Secondary | ICD-10-CM | POA: Diagnosis not present

## 2017-03-09 DIAGNOSIS — T8249XD Other complication of vascular dialysis catheter, subsequent encounter: Secondary | ICD-10-CM | POA: Diagnosis not present

## 2017-03-09 DIAGNOSIS — N2581 Secondary hyperparathyroidism of renal origin: Secondary | ICD-10-CM | POA: Diagnosis not present

## 2017-03-09 DIAGNOSIS — N186 End stage renal disease: Secondary | ICD-10-CM | POA: Diagnosis not present

## 2017-03-09 DIAGNOSIS — D509 Iron deficiency anemia, unspecified: Secondary | ICD-10-CM | POA: Diagnosis not present

## 2017-03-11 DIAGNOSIS — E1129 Type 2 diabetes mellitus with other diabetic kidney complication: Secondary | ICD-10-CM | POA: Diagnosis not present

## 2017-03-11 DIAGNOSIS — D509 Iron deficiency anemia, unspecified: Secondary | ICD-10-CM | POA: Diagnosis not present

## 2017-03-11 DIAGNOSIS — T8249XD Other complication of vascular dialysis catheter, subsequent encounter: Secondary | ICD-10-CM | POA: Diagnosis not present

## 2017-03-11 DIAGNOSIS — D689 Coagulation defect, unspecified: Secondary | ICD-10-CM | POA: Diagnosis not present

## 2017-03-11 DIAGNOSIS — N2581 Secondary hyperparathyroidism of renal origin: Secondary | ICD-10-CM | POA: Diagnosis not present

## 2017-03-11 DIAGNOSIS — Z283 Underimmunization status: Secondary | ICD-10-CM | POA: Diagnosis not present

## 2017-03-11 DIAGNOSIS — N186 End stage renal disease: Secondary | ICD-10-CM | POA: Diagnosis not present

## 2017-03-12 ENCOUNTER — Other Ambulatory Visit: Payer: Self-pay | Admitting: Cardiovascular Disease

## 2017-03-13 NOTE — Telephone Encounter (Signed)
hydrALAZINE (APRESOLINE) 25 MG tablet  Medication  Date: 02/20/2017 Department: [facility] Ordering/Authorizing: Cristal Ford, DO  Order Providers   Prescribing Provider Encounter Provider  Cristal Ford, DO None  Medication Detail    Disp Refills Start End   hydrALAZINE (APRESOLINE) 25 MG tablet 90 tablet 0 02/20/2017    Sig - Route: Take 1 tablet (25 mg total) by mouth every 8 (eight) hours as needed (SBP > 170 or DBP > 110). - Oral   Sent to pharmacy as: hydrALAZINE (APRESOLINE) 25 MG tablet   E-Prescribing Status: Receipt confirmed by pharmacy (02/20/2017 10:17 AM EDT)   Pharmacy   [id number] - [place], [address]       Patient taking medication PRN now since hospital discharge. Refills sent with new RX sig. Refill of the RX not appropriate.

## 2017-03-14 DIAGNOSIS — N2581 Secondary hyperparathyroidism of renal origin: Secondary | ICD-10-CM | POA: Diagnosis not present

## 2017-03-14 DIAGNOSIS — N186 End stage renal disease: Secondary | ICD-10-CM | POA: Diagnosis not present

## 2017-03-14 DIAGNOSIS — Z283 Underimmunization status: Secondary | ICD-10-CM | POA: Diagnosis not present

## 2017-03-14 DIAGNOSIS — T8249XD Other complication of vascular dialysis catheter, subsequent encounter: Secondary | ICD-10-CM | POA: Diagnosis not present

## 2017-03-14 DIAGNOSIS — D509 Iron deficiency anemia, unspecified: Secondary | ICD-10-CM | POA: Diagnosis not present

## 2017-03-14 DIAGNOSIS — E1129 Type 2 diabetes mellitus with other diabetic kidney complication: Secondary | ICD-10-CM | POA: Diagnosis not present

## 2017-03-14 DIAGNOSIS — D689 Coagulation defect, unspecified: Secondary | ICD-10-CM | POA: Diagnosis not present

## 2017-03-15 DIAGNOSIS — I132 Hypertensive heart and chronic kidney disease with heart failure and with stage 5 chronic kidney disease, or end stage renal disease: Secondary | ICD-10-CM | POA: Diagnosis not present

## 2017-03-15 DIAGNOSIS — E1122 Type 2 diabetes mellitus with diabetic chronic kidney disease: Secondary | ICD-10-CM | POA: Diagnosis not present

## 2017-03-15 DIAGNOSIS — N186 End stage renal disease: Secondary | ICD-10-CM | POA: Diagnosis not present

## 2017-03-15 DIAGNOSIS — D631 Anemia in chronic kidney disease: Secondary | ICD-10-CM | POA: Diagnosis not present

## 2017-03-15 DIAGNOSIS — I5043 Acute on chronic combined systolic (congestive) and diastolic (congestive) heart failure: Secondary | ICD-10-CM | POA: Diagnosis not present

## 2017-03-16 ENCOUNTER — Encounter: Payer: Medicare Other | Admitting: Vascular Surgery

## 2017-03-16 ENCOUNTER — Other Ambulatory Visit (HOSPITAL_COMMUNITY): Payer: Medicare Other

## 2017-03-16 ENCOUNTER — Encounter (HOSPITAL_COMMUNITY): Payer: Medicare Other

## 2017-03-16 DIAGNOSIS — Z283 Underimmunization status: Secondary | ICD-10-CM | POA: Diagnosis not present

## 2017-03-16 DIAGNOSIS — I132 Hypertensive heart and chronic kidney disease with heart failure and with stage 5 chronic kidney disease, or end stage renal disease: Secondary | ICD-10-CM | POA: Diagnosis not present

## 2017-03-16 DIAGNOSIS — D631 Anemia in chronic kidney disease: Secondary | ICD-10-CM | POA: Diagnosis not present

## 2017-03-16 DIAGNOSIS — E1129 Type 2 diabetes mellitus with other diabetic kidney complication: Secondary | ICD-10-CM | POA: Diagnosis not present

## 2017-03-16 DIAGNOSIS — I5043 Acute on chronic combined systolic (congestive) and diastolic (congestive) heart failure: Secondary | ICD-10-CM | POA: Diagnosis not present

## 2017-03-16 DIAGNOSIS — D509 Iron deficiency anemia, unspecified: Secondary | ICD-10-CM | POA: Diagnosis not present

## 2017-03-16 DIAGNOSIS — E1122 Type 2 diabetes mellitus with diabetic chronic kidney disease: Secondary | ICD-10-CM | POA: Diagnosis not present

## 2017-03-16 DIAGNOSIS — D689 Coagulation defect, unspecified: Secondary | ICD-10-CM | POA: Diagnosis not present

## 2017-03-16 DIAGNOSIS — N2581 Secondary hyperparathyroidism of renal origin: Secondary | ICD-10-CM | POA: Diagnosis not present

## 2017-03-16 DIAGNOSIS — N186 End stage renal disease: Secondary | ICD-10-CM | POA: Diagnosis not present

## 2017-03-16 DIAGNOSIS — T8249XD Other complication of vascular dialysis catheter, subsequent encounter: Secondary | ICD-10-CM | POA: Diagnosis not present

## 2017-03-17 DIAGNOSIS — I132 Hypertensive heart and chronic kidney disease with heart failure and with stage 5 chronic kidney disease, or end stage renal disease: Secondary | ICD-10-CM | POA: Diagnosis not present

## 2017-03-17 DIAGNOSIS — E1122 Type 2 diabetes mellitus with diabetic chronic kidney disease: Secondary | ICD-10-CM | POA: Diagnosis not present

## 2017-03-17 DIAGNOSIS — I5043 Acute on chronic combined systolic (congestive) and diastolic (congestive) heart failure: Secondary | ICD-10-CM | POA: Diagnosis not present

## 2017-03-17 DIAGNOSIS — N186 End stage renal disease: Secondary | ICD-10-CM | POA: Diagnosis not present

## 2017-03-17 DIAGNOSIS — D631 Anemia in chronic kidney disease: Secondary | ICD-10-CM | POA: Diagnosis not present

## 2017-03-18 DIAGNOSIS — T8249XD Other complication of vascular dialysis catheter, subsequent encounter: Secondary | ICD-10-CM | POA: Diagnosis not present

## 2017-03-18 DIAGNOSIS — E1129 Type 2 diabetes mellitus with other diabetic kidney complication: Secondary | ICD-10-CM | POA: Diagnosis not present

## 2017-03-18 DIAGNOSIS — N2581 Secondary hyperparathyroidism of renal origin: Secondary | ICD-10-CM | POA: Diagnosis not present

## 2017-03-18 DIAGNOSIS — N186 End stage renal disease: Secondary | ICD-10-CM | POA: Diagnosis not present

## 2017-03-18 DIAGNOSIS — D509 Iron deficiency anemia, unspecified: Secondary | ICD-10-CM | POA: Diagnosis not present

## 2017-03-18 DIAGNOSIS — Z283 Underimmunization status: Secondary | ICD-10-CM | POA: Diagnosis not present

## 2017-03-18 DIAGNOSIS — D689 Coagulation defect, unspecified: Secondary | ICD-10-CM | POA: Diagnosis not present

## 2017-03-20 DIAGNOSIS — I132 Hypertensive heart and chronic kidney disease with heart failure and with stage 5 chronic kidney disease, or end stage renal disease: Secondary | ICD-10-CM | POA: Diagnosis not present

## 2017-03-20 DIAGNOSIS — I5043 Acute on chronic combined systolic (congestive) and diastolic (congestive) heart failure: Secondary | ICD-10-CM | POA: Diagnosis not present

## 2017-03-20 DIAGNOSIS — E1122 Type 2 diabetes mellitus with diabetic chronic kidney disease: Secondary | ICD-10-CM | POA: Diagnosis not present

## 2017-03-20 DIAGNOSIS — N186 End stage renal disease: Secondary | ICD-10-CM | POA: Diagnosis not present

## 2017-03-20 DIAGNOSIS — D631 Anemia in chronic kidney disease: Secondary | ICD-10-CM | POA: Diagnosis not present

## 2017-03-21 ENCOUNTER — Encounter: Payer: Self-pay | Admitting: Vascular Surgery

## 2017-03-21 DIAGNOSIS — T8249XD Other complication of vascular dialysis catheter, subsequent encounter: Secondary | ICD-10-CM | POA: Diagnosis not present

## 2017-03-21 DIAGNOSIS — E1129 Type 2 diabetes mellitus with other diabetic kidney complication: Secondary | ICD-10-CM | POA: Diagnosis not present

## 2017-03-21 DIAGNOSIS — Z283 Underimmunization status: Secondary | ICD-10-CM | POA: Diagnosis not present

## 2017-03-21 DIAGNOSIS — N186 End stage renal disease: Secondary | ICD-10-CM | POA: Diagnosis not present

## 2017-03-21 DIAGNOSIS — D509 Iron deficiency anemia, unspecified: Secondary | ICD-10-CM | POA: Diagnosis not present

## 2017-03-21 DIAGNOSIS — D689 Coagulation defect, unspecified: Secondary | ICD-10-CM | POA: Diagnosis not present

## 2017-03-21 DIAGNOSIS — N2581 Secondary hyperparathyroidism of renal origin: Secondary | ICD-10-CM | POA: Diagnosis not present

## 2017-03-22 DIAGNOSIS — N186 End stage renal disease: Secondary | ICD-10-CM | POA: Diagnosis not present

## 2017-03-22 DIAGNOSIS — I5043 Acute on chronic combined systolic (congestive) and diastolic (congestive) heart failure: Secondary | ICD-10-CM | POA: Diagnosis not present

## 2017-03-22 DIAGNOSIS — E1122 Type 2 diabetes mellitus with diabetic chronic kidney disease: Secondary | ICD-10-CM | POA: Diagnosis not present

## 2017-03-22 DIAGNOSIS — D631 Anemia in chronic kidney disease: Secondary | ICD-10-CM | POA: Diagnosis not present

## 2017-03-22 DIAGNOSIS — I132 Hypertensive heart and chronic kidney disease with heart failure and with stage 5 chronic kidney disease, or end stage renal disease: Secondary | ICD-10-CM | POA: Diagnosis not present

## 2017-03-23 DIAGNOSIS — N2581 Secondary hyperparathyroidism of renal origin: Secondary | ICD-10-CM | POA: Diagnosis not present

## 2017-03-23 DIAGNOSIS — D509 Iron deficiency anemia, unspecified: Secondary | ICD-10-CM | POA: Diagnosis not present

## 2017-03-23 DIAGNOSIS — N186 End stage renal disease: Secondary | ICD-10-CM | POA: Diagnosis not present

## 2017-03-23 DIAGNOSIS — T8249XD Other complication of vascular dialysis catheter, subsequent encounter: Secondary | ICD-10-CM | POA: Diagnosis not present

## 2017-03-23 DIAGNOSIS — D689 Coagulation defect, unspecified: Secondary | ICD-10-CM | POA: Diagnosis not present

## 2017-03-23 DIAGNOSIS — E1129 Type 2 diabetes mellitus with other diabetic kidney complication: Secondary | ICD-10-CM | POA: Diagnosis not present

## 2017-03-23 DIAGNOSIS — Z283 Underimmunization status: Secondary | ICD-10-CM | POA: Diagnosis not present

## 2017-03-24 DIAGNOSIS — I5043 Acute on chronic combined systolic (congestive) and diastolic (congestive) heart failure: Secondary | ICD-10-CM | POA: Diagnosis not present

## 2017-03-24 DIAGNOSIS — D631 Anemia in chronic kidney disease: Secondary | ICD-10-CM | POA: Diagnosis not present

## 2017-03-24 DIAGNOSIS — N186 End stage renal disease: Secondary | ICD-10-CM | POA: Diagnosis not present

## 2017-03-24 DIAGNOSIS — E1122 Type 2 diabetes mellitus with diabetic chronic kidney disease: Secondary | ICD-10-CM | POA: Diagnosis not present

## 2017-03-24 DIAGNOSIS — I132 Hypertensive heart and chronic kidney disease with heart failure and with stage 5 chronic kidney disease, or end stage renal disease: Secondary | ICD-10-CM | POA: Diagnosis not present

## 2017-03-25 DIAGNOSIS — T8249XD Other complication of vascular dialysis catheter, subsequent encounter: Secondary | ICD-10-CM | POA: Diagnosis not present

## 2017-03-25 DIAGNOSIS — N2581 Secondary hyperparathyroidism of renal origin: Secondary | ICD-10-CM | POA: Diagnosis not present

## 2017-03-25 DIAGNOSIS — Z283 Underimmunization status: Secondary | ICD-10-CM | POA: Diagnosis not present

## 2017-03-25 DIAGNOSIS — N186 End stage renal disease: Secondary | ICD-10-CM | POA: Diagnosis not present

## 2017-03-25 DIAGNOSIS — D509 Iron deficiency anemia, unspecified: Secondary | ICD-10-CM | POA: Diagnosis not present

## 2017-03-25 DIAGNOSIS — D689 Coagulation defect, unspecified: Secondary | ICD-10-CM | POA: Diagnosis not present

## 2017-03-25 DIAGNOSIS — E1129 Type 2 diabetes mellitus with other diabetic kidney complication: Secondary | ICD-10-CM | POA: Diagnosis not present

## 2017-03-27 DIAGNOSIS — Z720 Tobacco use: Secondary | ICD-10-CM | POA: Diagnosis not present

## 2017-03-27 DIAGNOSIS — I132 Hypertensive heart and chronic kidney disease with heart failure and with stage 5 chronic kidney disease, or end stage renal disease: Secondary | ICD-10-CM | POA: Diagnosis not present

## 2017-03-27 DIAGNOSIS — N289 Disorder of kidney and ureter, unspecified: Secondary | ICD-10-CM | POA: Diagnosis not present

## 2017-03-27 DIAGNOSIS — N186 End stage renal disease: Secondary | ICD-10-CM | POA: Diagnosis not present

## 2017-03-27 DIAGNOSIS — I10 Essential (primary) hypertension: Secondary | ICD-10-CM | POA: Diagnosis not present

## 2017-03-27 DIAGNOSIS — Z7689 Persons encountering health services in other specified circumstances: Secondary | ICD-10-CM | POA: Diagnosis not present

## 2017-03-27 DIAGNOSIS — E785 Hyperlipidemia, unspecified: Secondary | ICD-10-CM | POA: Diagnosis not present

## 2017-03-27 DIAGNOSIS — E1122 Type 2 diabetes mellitus with diabetic chronic kidney disease: Secondary | ICD-10-CM | POA: Diagnosis not present

## 2017-03-27 DIAGNOSIS — E119 Type 2 diabetes mellitus without complications: Secondary | ICD-10-CM | POA: Diagnosis not present

## 2017-03-27 DIAGNOSIS — I5043 Acute on chronic combined systolic (congestive) and diastolic (congestive) heart failure: Secondary | ICD-10-CM | POA: Diagnosis not present

## 2017-03-27 DIAGNOSIS — D631 Anemia in chronic kidney disease: Secondary | ICD-10-CM | POA: Diagnosis not present

## 2017-03-28 DIAGNOSIS — E1129 Type 2 diabetes mellitus with other diabetic kidney complication: Secondary | ICD-10-CM | POA: Diagnosis not present

## 2017-03-28 DIAGNOSIS — N2581 Secondary hyperparathyroidism of renal origin: Secondary | ICD-10-CM | POA: Diagnosis not present

## 2017-03-28 DIAGNOSIS — D689 Coagulation defect, unspecified: Secondary | ICD-10-CM | POA: Diagnosis not present

## 2017-03-28 DIAGNOSIS — N186 End stage renal disease: Secondary | ICD-10-CM | POA: Diagnosis not present

## 2017-03-28 DIAGNOSIS — T8249XD Other complication of vascular dialysis catheter, subsequent encounter: Secondary | ICD-10-CM | POA: Diagnosis not present

## 2017-03-28 DIAGNOSIS — Z283 Underimmunization status: Secondary | ICD-10-CM | POA: Diagnosis not present

## 2017-03-28 DIAGNOSIS — D509 Iron deficiency anemia, unspecified: Secondary | ICD-10-CM | POA: Diagnosis not present

## 2017-03-30 ENCOUNTER — Encounter: Payer: Medicare Other | Admitting: Vascular Surgery

## 2017-03-30 ENCOUNTER — Encounter (HOSPITAL_COMMUNITY): Payer: Medicare Other

## 2017-03-30 DIAGNOSIS — N2581 Secondary hyperparathyroidism of renal origin: Secondary | ICD-10-CM | POA: Diagnosis not present

## 2017-03-30 DIAGNOSIS — D509 Iron deficiency anemia, unspecified: Secondary | ICD-10-CM | POA: Diagnosis not present

## 2017-03-30 DIAGNOSIS — N186 End stage renal disease: Secondary | ICD-10-CM | POA: Diagnosis not present

## 2017-03-30 DIAGNOSIS — T8249XD Other complication of vascular dialysis catheter, subsequent encounter: Secondary | ICD-10-CM | POA: Diagnosis not present

## 2017-03-30 DIAGNOSIS — E1129 Type 2 diabetes mellitus with other diabetic kidney complication: Secondary | ICD-10-CM | POA: Diagnosis not present

## 2017-03-30 DIAGNOSIS — Z283 Underimmunization status: Secondary | ICD-10-CM | POA: Diagnosis not present

## 2017-03-30 DIAGNOSIS — D689 Coagulation defect, unspecified: Secondary | ICD-10-CM | POA: Diagnosis not present

## 2017-03-31 DIAGNOSIS — I132 Hypertensive heart and chronic kidney disease with heart failure and with stage 5 chronic kidney disease, or end stage renal disease: Secondary | ICD-10-CM | POA: Diagnosis not present

## 2017-03-31 DIAGNOSIS — I5043 Acute on chronic combined systolic (congestive) and diastolic (congestive) heart failure: Secondary | ICD-10-CM | POA: Diagnosis not present

## 2017-03-31 DIAGNOSIS — N186 End stage renal disease: Secondary | ICD-10-CM | POA: Diagnosis not present

## 2017-03-31 DIAGNOSIS — E1122 Type 2 diabetes mellitus with diabetic chronic kidney disease: Secondary | ICD-10-CM | POA: Diagnosis not present

## 2017-03-31 DIAGNOSIS — D631 Anemia in chronic kidney disease: Secondary | ICD-10-CM | POA: Diagnosis not present

## 2017-04-01 DIAGNOSIS — D689 Coagulation defect, unspecified: Secondary | ICD-10-CM | POA: Diagnosis not present

## 2017-04-01 DIAGNOSIS — N2581 Secondary hyperparathyroidism of renal origin: Secondary | ICD-10-CM | POA: Diagnosis not present

## 2017-04-01 DIAGNOSIS — T8249XD Other complication of vascular dialysis catheter, subsequent encounter: Secondary | ICD-10-CM | POA: Diagnosis not present

## 2017-04-01 DIAGNOSIS — Z283 Underimmunization status: Secondary | ICD-10-CM | POA: Diagnosis not present

## 2017-04-01 DIAGNOSIS — E1129 Type 2 diabetes mellitus with other diabetic kidney complication: Secondary | ICD-10-CM | POA: Diagnosis not present

## 2017-04-01 DIAGNOSIS — N186 End stage renal disease: Secondary | ICD-10-CM | POA: Diagnosis not present

## 2017-04-01 DIAGNOSIS — D509 Iron deficiency anemia, unspecified: Secondary | ICD-10-CM | POA: Diagnosis not present

## 2017-04-03 DIAGNOSIS — I5043 Acute on chronic combined systolic (congestive) and diastolic (congestive) heart failure: Secondary | ICD-10-CM | POA: Diagnosis not present

## 2017-04-03 DIAGNOSIS — D631 Anemia in chronic kidney disease: Secondary | ICD-10-CM | POA: Diagnosis not present

## 2017-04-03 DIAGNOSIS — N186 End stage renal disease: Secondary | ICD-10-CM | POA: Diagnosis not present

## 2017-04-03 DIAGNOSIS — E1122 Type 2 diabetes mellitus with diabetic chronic kidney disease: Secondary | ICD-10-CM | POA: Diagnosis not present

## 2017-04-03 DIAGNOSIS — I132 Hypertensive heart and chronic kidney disease with heart failure and with stage 5 chronic kidney disease, or end stage renal disease: Secondary | ICD-10-CM | POA: Diagnosis not present

## 2017-04-04 DIAGNOSIS — Z283 Underimmunization status: Secondary | ICD-10-CM | POA: Diagnosis not present

## 2017-04-04 DIAGNOSIS — D509 Iron deficiency anemia, unspecified: Secondary | ICD-10-CM | POA: Diagnosis not present

## 2017-04-04 DIAGNOSIS — E1129 Type 2 diabetes mellitus with other diabetic kidney complication: Secondary | ICD-10-CM | POA: Diagnosis not present

## 2017-04-04 DIAGNOSIS — N186 End stage renal disease: Secondary | ICD-10-CM | POA: Diagnosis not present

## 2017-04-04 DIAGNOSIS — T8249XD Other complication of vascular dialysis catheter, subsequent encounter: Secondary | ICD-10-CM | POA: Diagnosis not present

## 2017-04-04 DIAGNOSIS — D689 Coagulation defect, unspecified: Secondary | ICD-10-CM | POA: Diagnosis not present

## 2017-04-04 DIAGNOSIS — N2581 Secondary hyperparathyroidism of renal origin: Secondary | ICD-10-CM | POA: Diagnosis not present

## 2017-04-05 DIAGNOSIS — N186 End stage renal disease: Secondary | ICD-10-CM | POA: Diagnosis not present

## 2017-04-05 DIAGNOSIS — I5043 Acute on chronic combined systolic (congestive) and diastolic (congestive) heart failure: Secondary | ICD-10-CM | POA: Diagnosis not present

## 2017-04-05 DIAGNOSIS — E1122 Type 2 diabetes mellitus with diabetic chronic kidney disease: Secondary | ICD-10-CM | POA: Diagnosis not present

## 2017-04-05 DIAGNOSIS — I132 Hypertensive heart and chronic kidney disease with heart failure and with stage 5 chronic kidney disease, or end stage renal disease: Secondary | ICD-10-CM | POA: Diagnosis not present

## 2017-04-05 DIAGNOSIS — D631 Anemia in chronic kidney disease: Secondary | ICD-10-CM | POA: Diagnosis not present

## 2017-04-06 DIAGNOSIS — D509 Iron deficiency anemia, unspecified: Secondary | ICD-10-CM | POA: Diagnosis not present

## 2017-04-06 DIAGNOSIS — I5043 Acute on chronic combined systolic (congestive) and diastolic (congestive) heart failure: Secondary | ICD-10-CM | POA: Diagnosis not present

## 2017-04-06 DIAGNOSIS — E1129 Type 2 diabetes mellitus with other diabetic kidney complication: Secondary | ICD-10-CM | POA: Diagnosis not present

## 2017-04-06 DIAGNOSIS — E1122 Type 2 diabetes mellitus with diabetic chronic kidney disease: Secondary | ICD-10-CM | POA: Diagnosis not present

## 2017-04-06 DIAGNOSIS — N2581 Secondary hyperparathyroidism of renal origin: Secondary | ICD-10-CM | POA: Diagnosis not present

## 2017-04-06 DIAGNOSIS — D689 Coagulation defect, unspecified: Secondary | ICD-10-CM | POA: Diagnosis not present

## 2017-04-06 DIAGNOSIS — T8249XD Other complication of vascular dialysis catheter, subsequent encounter: Secondary | ICD-10-CM | POA: Diagnosis not present

## 2017-04-06 DIAGNOSIS — N186 End stage renal disease: Secondary | ICD-10-CM | POA: Diagnosis not present

## 2017-04-06 DIAGNOSIS — Z283 Underimmunization status: Secondary | ICD-10-CM | POA: Diagnosis not present

## 2017-04-06 DIAGNOSIS — D631 Anemia in chronic kidney disease: Secondary | ICD-10-CM | POA: Diagnosis not present

## 2017-04-06 DIAGNOSIS — I132 Hypertensive heart and chronic kidney disease with heart failure and with stage 5 chronic kidney disease, or end stage renal disease: Secondary | ICD-10-CM | POA: Diagnosis not present

## 2017-04-07 DIAGNOSIS — Z992 Dependence on renal dialysis: Secondary | ICD-10-CM | POA: Diagnosis not present

## 2017-04-07 DIAGNOSIS — N186 End stage renal disease: Secondary | ICD-10-CM | POA: Diagnosis not present

## 2017-04-07 DIAGNOSIS — E1122 Type 2 diabetes mellitus with diabetic chronic kidney disease: Secondary | ICD-10-CM | POA: Diagnosis not present

## 2017-04-08 DIAGNOSIS — Z283 Underimmunization status: Secondary | ICD-10-CM | POA: Diagnosis not present

## 2017-04-08 DIAGNOSIS — D689 Coagulation defect, unspecified: Secondary | ICD-10-CM | POA: Diagnosis not present

## 2017-04-08 DIAGNOSIS — N2581 Secondary hyperparathyroidism of renal origin: Secondary | ICD-10-CM | POA: Diagnosis not present

## 2017-04-08 DIAGNOSIS — D631 Anemia in chronic kidney disease: Secondary | ICD-10-CM | POA: Diagnosis not present

## 2017-04-08 DIAGNOSIS — D509 Iron deficiency anemia, unspecified: Secondary | ICD-10-CM | POA: Diagnosis not present

## 2017-04-08 DIAGNOSIS — E1129 Type 2 diabetes mellitus with other diabetic kidney complication: Secondary | ICD-10-CM | POA: Diagnosis not present

## 2017-04-08 DIAGNOSIS — T8249XD Other complication of vascular dialysis catheter, subsequent encounter: Secondary | ICD-10-CM | POA: Diagnosis not present

## 2017-04-08 DIAGNOSIS — N186 End stage renal disease: Secondary | ICD-10-CM | POA: Diagnosis not present

## 2017-04-11 DIAGNOSIS — D509 Iron deficiency anemia, unspecified: Secondary | ICD-10-CM | POA: Diagnosis not present

## 2017-04-11 DIAGNOSIS — E1129 Type 2 diabetes mellitus with other diabetic kidney complication: Secondary | ICD-10-CM | POA: Diagnosis not present

## 2017-04-11 DIAGNOSIS — T8249XD Other complication of vascular dialysis catheter, subsequent encounter: Secondary | ICD-10-CM | POA: Diagnosis not present

## 2017-04-11 DIAGNOSIS — D689 Coagulation defect, unspecified: Secondary | ICD-10-CM | POA: Diagnosis not present

## 2017-04-11 DIAGNOSIS — N2581 Secondary hyperparathyroidism of renal origin: Secondary | ICD-10-CM | POA: Diagnosis not present

## 2017-04-11 DIAGNOSIS — D631 Anemia in chronic kidney disease: Secondary | ICD-10-CM | POA: Diagnosis not present

## 2017-04-11 DIAGNOSIS — Z283 Underimmunization status: Secondary | ICD-10-CM | POA: Diagnosis not present

## 2017-04-11 DIAGNOSIS — N186 End stage renal disease: Secondary | ICD-10-CM | POA: Diagnosis not present

## 2017-04-12 DIAGNOSIS — I5043 Acute on chronic combined systolic (congestive) and diastolic (congestive) heart failure: Secondary | ICD-10-CM | POA: Diagnosis not present

## 2017-04-12 DIAGNOSIS — D631 Anemia in chronic kidney disease: Secondary | ICD-10-CM | POA: Diagnosis not present

## 2017-04-12 DIAGNOSIS — E1122 Type 2 diabetes mellitus with diabetic chronic kidney disease: Secondary | ICD-10-CM | POA: Diagnosis not present

## 2017-04-12 DIAGNOSIS — I132 Hypertensive heart and chronic kidney disease with heart failure and with stage 5 chronic kidney disease, or end stage renal disease: Secondary | ICD-10-CM | POA: Diagnosis not present

## 2017-04-12 DIAGNOSIS — N186 End stage renal disease: Secondary | ICD-10-CM | POA: Diagnosis not present

## 2017-04-13 DIAGNOSIS — Z283 Underimmunization status: Secondary | ICD-10-CM | POA: Diagnosis not present

## 2017-04-13 DIAGNOSIS — T8249XD Other complication of vascular dialysis catheter, subsequent encounter: Secondary | ICD-10-CM | POA: Diagnosis not present

## 2017-04-13 DIAGNOSIS — D689 Coagulation defect, unspecified: Secondary | ICD-10-CM | POA: Diagnosis not present

## 2017-04-13 DIAGNOSIS — D509 Iron deficiency anemia, unspecified: Secondary | ICD-10-CM | POA: Diagnosis not present

## 2017-04-13 DIAGNOSIS — N2581 Secondary hyperparathyroidism of renal origin: Secondary | ICD-10-CM | POA: Diagnosis not present

## 2017-04-13 DIAGNOSIS — D631 Anemia in chronic kidney disease: Secondary | ICD-10-CM | POA: Diagnosis not present

## 2017-04-13 DIAGNOSIS — N186 End stage renal disease: Secondary | ICD-10-CM | POA: Diagnosis not present

## 2017-04-13 DIAGNOSIS — E1129 Type 2 diabetes mellitus with other diabetic kidney complication: Secondary | ICD-10-CM | POA: Diagnosis not present

## 2017-04-14 DIAGNOSIS — D631 Anemia in chronic kidney disease: Secondary | ICD-10-CM | POA: Diagnosis not present

## 2017-04-14 DIAGNOSIS — I5043 Acute on chronic combined systolic (congestive) and diastolic (congestive) heart failure: Secondary | ICD-10-CM | POA: Diagnosis not present

## 2017-04-14 DIAGNOSIS — N186 End stage renal disease: Secondary | ICD-10-CM | POA: Diagnosis not present

## 2017-04-14 DIAGNOSIS — I132 Hypertensive heart and chronic kidney disease with heart failure and with stage 5 chronic kidney disease, or end stage renal disease: Secondary | ICD-10-CM | POA: Diagnosis not present

## 2017-04-14 DIAGNOSIS — E1122 Type 2 diabetes mellitus with diabetic chronic kidney disease: Secondary | ICD-10-CM | POA: Diagnosis not present

## 2017-04-15 DIAGNOSIS — D631 Anemia in chronic kidney disease: Secondary | ICD-10-CM | POA: Diagnosis not present

## 2017-04-15 DIAGNOSIS — E1129 Type 2 diabetes mellitus with other diabetic kidney complication: Secondary | ICD-10-CM | POA: Diagnosis not present

## 2017-04-15 DIAGNOSIS — D689 Coagulation defect, unspecified: Secondary | ICD-10-CM | POA: Diagnosis not present

## 2017-04-15 DIAGNOSIS — Z283 Underimmunization status: Secondary | ICD-10-CM | POA: Diagnosis not present

## 2017-04-15 DIAGNOSIS — D509 Iron deficiency anemia, unspecified: Secondary | ICD-10-CM | POA: Diagnosis not present

## 2017-04-15 DIAGNOSIS — T8249XD Other complication of vascular dialysis catheter, subsequent encounter: Secondary | ICD-10-CM | POA: Diagnosis not present

## 2017-04-15 DIAGNOSIS — N2581 Secondary hyperparathyroidism of renal origin: Secondary | ICD-10-CM | POA: Diagnosis not present

## 2017-04-15 DIAGNOSIS — N186 End stage renal disease: Secondary | ICD-10-CM | POA: Diagnosis not present

## 2017-04-17 DIAGNOSIS — D631 Anemia in chronic kidney disease: Secondary | ICD-10-CM | POA: Diagnosis not present

## 2017-04-17 DIAGNOSIS — I5043 Acute on chronic combined systolic (congestive) and diastolic (congestive) heart failure: Secondary | ICD-10-CM | POA: Diagnosis not present

## 2017-04-17 DIAGNOSIS — E1122 Type 2 diabetes mellitus with diabetic chronic kidney disease: Secondary | ICD-10-CM | POA: Diagnosis not present

## 2017-04-17 DIAGNOSIS — I132 Hypertensive heart and chronic kidney disease with heart failure and with stage 5 chronic kidney disease, or end stage renal disease: Secondary | ICD-10-CM | POA: Diagnosis not present

## 2017-04-17 DIAGNOSIS — N186 End stage renal disease: Secondary | ICD-10-CM | POA: Diagnosis not present

## 2017-04-18 DIAGNOSIS — Z283 Underimmunization status: Secondary | ICD-10-CM | POA: Diagnosis not present

## 2017-04-18 DIAGNOSIS — D631 Anemia in chronic kidney disease: Secondary | ICD-10-CM | POA: Diagnosis not present

## 2017-04-18 DIAGNOSIS — N186 End stage renal disease: Secondary | ICD-10-CM | POA: Diagnosis not present

## 2017-04-18 DIAGNOSIS — D689 Coagulation defect, unspecified: Secondary | ICD-10-CM | POA: Diagnosis not present

## 2017-04-18 DIAGNOSIS — T8249XD Other complication of vascular dialysis catheter, subsequent encounter: Secondary | ICD-10-CM | POA: Diagnosis not present

## 2017-04-18 DIAGNOSIS — E1129 Type 2 diabetes mellitus with other diabetic kidney complication: Secondary | ICD-10-CM | POA: Diagnosis not present

## 2017-04-18 DIAGNOSIS — N2581 Secondary hyperparathyroidism of renal origin: Secondary | ICD-10-CM | POA: Diagnosis not present

## 2017-04-18 DIAGNOSIS — D509 Iron deficiency anemia, unspecified: Secondary | ICD-10-CM | POA: Diagnosis not present

## 2017-04-19 DIAGNOSIS — D631 Anemia in chronic kidney disease: Secondary | ICD-10-CM | POA: Diagnosis not present

## 2017-04-19 DIAGNOSIS — I5043 Acute on chronic combined systolic (congestive) and diastolic (congestive) heart failure: Secondary | ICD-10-CM | POA: Diagnosis not present

## 2017-04-19 DIAGNOSIS — N186 End stage renal disease: Secondary | ICD-10-CM | POA: Diagnosis not present

## 2017-04-19 DIAGNOSIS — I132 Hypertensive heart and chronic kidney disease with heart failure and with stage 5 chronic kidney disease, or end stage renal disease: Secondary | ICD-10-CM | POA: Diagnosis not present

## 2017-04-19 DIAGNOSIS — E1122 Type 2 diabetes mellitus with diabetic chronic kidney disease: Secondary | ICD-10-CM | POA: Diagnosis not present

## 2017-04-20 DIAGNOSIS — N2581 Secondary hyperparathyroidism of renal origin: Secondary | ICD-10-CM | POA: Diagnosis not present

## 2017-04-20 DIAGNOSIS — Z283 Underimmunization status: Secondary | ICD-10-CM | POA: Diagnosis not present

## 2017-04-20 DIAGNOSIS — D631 Anemia in chronic kidney disease: Secondary | ICD-10-CM | POA: Diagnosis not present

## 2017-04-20 DIAGNOSIS — T8249XD Other complication of vascular dialysis catheter, subsequent encounter: Secondary | ICD-10-CM | POA: Diagnosis not present

## 2017-04-20 DIAGNOSIS — E1122 Type 2 diabetes mellitus with diabetic chronic kidney disease: Secondary | ICD-10-CM | POA: Diagnosis not present

## 2017-04-20 DIAGNOSIS — I132 Hypertensive heart and chronic kidney disease with heart failure and with stage 5 chronic kidney disease, or end stage renal disease: Secondary | ICD-10-CM | POA: Diagnosis not present

## 2017-04-20 DIAGNOSIS — D509 Iron deficiency anemia, unspecified: Secondary | ICD-10-CM | POA: Diagnosis not present

## 2017-04-20 DIAGNOSIS — D689 Coagulation defect, unspecified: Secondary | ICD-10-CM | POA: Diagnosis not present

## 2017-04-20 DIAGNOSIS — I5043 Acute on chronic combined systolic (congestive) and diastolic (congestive) heart failure: Secondary | ICD-10-CM | POA: Diagnosis not present

## 2017-04-20 DIAGNOSIS — N186 End stage renal disease: Secondary | ICD-10-CM | POA: Diagnosis not present

## 2017-04-20 DIAGNOSIS — E1129 Type 2 diabetes mellitus with other diabetic kidney complication: Secondary | ICD-10-CM | POA: Diagnosis not present

## 2017-04-22 DIAGNOSIS — D631 Anemia in chronic kidney disease: Secondary | ICD-10-CM | POA: Diagnosis not present

## 2017-04-22 DIAGNOSIS — T8249XD Other complication of vascular dialysis catheter, subsequent encounter: Secondary | ICD-10-CM | POA: Diagnosis not present

## 2017-04-22 DIAGNOSIS — D509 Iron deficiency anemia, unspecified: Secondary | ICD-10-CM | POA: Diagnosis not present

## 2017-04-22 DIAGNOSIS — N2581 Secondary hyperparathyroidism of renal origin: Secondary | ICD-10-CM | POA: Diagnosis not present

## 2017-04-22 DIAGNOSIS — E1129 Type 2 diabetes mellitus with other diabetic kidney complication: Secondary | ICD-10-CM | POA: Diagnosis not present

## 2017-04-22 DIAGNOSIS — N186 End stage renal disease: Secondary | ICD-10-CM | POA: Diagnosis not present

## 2017-04-22 DIAGNOSIS — D689 Coagulation defect, unspecified: Secondary | ICD-10-CM | POA: Diagnosis not present

## 2017-04-22 DIAGNOSIS — Z283 Underimmunization status: Secondary | ICD-10-CM | POA: Diagnosis not present

## 2017-04-24 DIAGNOSIS — I132 Hypertensive heart and chronic kidney disease with heart failure and with stage 5 chronic kidney disease, or end stage renal disease: Secondary | ICD-10-CM | POA: Diagnosis not present

## 2017-04-24 DIAGNOSIS — E1122 Type 2 diabetes mellitus with diabetic chronic kidney disease: Secondary | ICD-10-CM | POA: Diagnosis not present

## 2017-04-24 DIAGNOSIS — I5043 Acute on chronic combined systolic (congestive) and diastolic (congestive) heart failure: Secondary | ICD-10-CM | POA: Diagnosis not present

## 2017-04-24 DIAGNOSIS — N186 End stage renal disease: Secondary | ICD-10-CM | POA: Diagnosis not present

## 2017-04-24 DIAGNOSIS — D631 Anemia in chronic kidney disease: Secondary | ICD-10-CM | POA: Diagnosis not present

## 2017-04-25 DIAGNOSIS — N2581 Secondary hyperparathyroidism of renal origin: Secondary | ICD-10-CM | POA: Diagnosis not present

## 2017-04-25 DIAGNOSIS — Z283 Underimmunization status: Secondary | ICD-10-CM | POA: Diagnosis not present

## 2017-04-25 DIAGNOSIS — N186 End stage renal disease: Secondary | ICD-10-CM | POA: Diagnosis not present

## 2017-04-25 DIAGNOSIS — D689 Coagulation defect, unspecified: Secondary | ICD-10-CM | POA: Diagnosis not present

## 2017-04-25 DIAGNOSIS — D509 Iron deficiency anemia, unspecified: Secondary | ICD-10-CM | POA: Diagnosis not present

## 2017-04-25 DIAGNOSIS — T8249XD Other complication of vascular dialysis catheter, subsequent encounter: Secondary | ICD-10-CM | POA: Diagnosis not present

## 2017-04-25 DIAGNOSIS — E1129 Type 2 diabetes mellitus with other diabetic kidney complication: Secondary | ICD-10-CM | POA: Diagnosis not present

## 2017-04-25 DIAGNOSIS — D631 Anemia in chronic kidney disease: Secondary | ICD-10-CM | POA: Diagnosis not present

## 2017-04-26 DIAGNOSIS — D631 Anemia in chronic kidney disease: Secondary | ICD-10-CM | POA: Diagnosis not present

## 2017-04-26 DIAGNOSIS — I132 Hypertensive heart and chronic kidney disease with heart failure and with stage 5 chronic kidney disease, or end stage renal disease: Secondary | ICD-10-CM | POA: Diagnosis not present

## 2017-04-26 DIAGNOSIS — N186 End stage renal disease: Secondary | ICD-10-CM | POA: Diagnosis not present

## 2017-04-26 DIAGNOSIS — E1122 Type 2 diabetes mellitus with diabetic chronic kidney disease: Secondary | ICD-10-CM | POA: Diagnosis not present

## 2017-04-26 DIAGNOSIS — I5043 Acute on chronic combined systolic (congestive) and diastolic (congestive) heart failure: Secondary | ICD-10-CM | POA: Diagnosis not present

## 2017-04-27 DIAGNOSIS — Z283 Underimmunization status: Secondary | ICD-10-CM | POA: Diagnosis not present

## 2017-04-27 DIAGNOSIS — N186 End stage renal disease: Secondary | ICD-10-CM | POA: Diagnosis not present

## 2017-04-27 DIAGNOSIS — N2581 Secondary hyperparathyroidism of renal origin: Secondary | ICD-10-CM | POA: Diagnosis not present

## 2017-04-27 DIAGNOSIS — D689 Coagulation defect, unspecified: Secondary | ICD-10-CM | POA: Diagnosis not present

## 2017-04-27 DIAGNOSIS — D509 Iron deficiency anemia, unspecified: Secondary | ICD-10-CM | POA: Diagnosis not present

## 2017-04-27 DIAGNOSIS — E1129 Type 2 diabetes mellitus with other diabetic kidney complication: Secondary | ICD-10-CM | POA: Diagnosis not present

## 2017-04-27 DIAGNOSIS — D631 Anemia in chronic kidney disease: Secondary | ICD-10-CM | POA: Diagnosis not present

## 2017-04-27 DIAGNOSIS — T8249XD Other complication of vascular dialysis catheter, subsequent encounter: Secondary | ICD-10-CM | POA: Diagnosis not present

## 2017-04-29 DIAGNOSIS — N186 End stage renal disease: Secondary | ICD-10-CM | POA: Diagnosis not present

## 2017-04-29 DIAGNOSIS — E1129 Type 2 diabetes mellitus with other diabetic kidney complication: Secondary | ICD-10-CM | POA: Diagnosis not present

## 2017-04-29 DIAGNOSIS — D509 Iron deficiency anemia, unspecified: Secondary | ICD-10-CM | POA: Diagnosis not present

## 2017-04-29 DIAGNOSIS — D631 Anemia in chronic kidney disease: Secondary | ICD-10-CM | POA: Diagnosis not present

## 2017-04-29 DIAGNOSIS — Z283 Underimmunization status: Secondary | ICD-10-CM | POA: Diagnosis not present

## 2017-04-29 DIAGNOSIS — D689 Coagulation defect, unspecified: Secondary | ICD-10-CM | POA: Diagnosis not present

## 2017-04-29 DIAGNOSIS — T8249XD Other complication of vascular dialysis catheter, subsequent encounter: Secondary | ICD-10-CM | POA: Diagnosis not present

## 2017-04-29 DIAGNOSIS — N2581 Secondary hyperparathyroidism of renal origin: Secondary | ICD-10-CM | POA: Diagnosis not present

## 2017-05-01 DIAGNOSIS — I132 Hypertensive heart and chronic kidney disease with heart failure and with stage 5 chronic kidney disease, or end stage renal disease: Secondary | ICD-10-CM | POA: Diagnosis not present

## 2017-05-01 DIAGNOSIS — E1122 Type 2 diabetes mellitus with diabetic chronic kidney disease: Secondary | ICD-10-CM | POA: Diagnosis not present

## 2017-05-01 DIAGNOSIS — D631 Anemia in chronic kidney disease: Secondary | ICD-10-CM | POA: Diagnosis not present

## 2017-05-01 DIAGNOSIS — N186 End stage renal disease: Secondary | ICD-10-CM | POA: Diagnosis not present

## 2017-05-01 DIAGNOSIS — I5043 Acute on chronic combined systolic (congestive) and diastolic (congestive) heart failure: Secondary | ICD-10-CM | POA: Diagnosis not present

## 2017-05-02 DIAGNOSIS — N186 End stage renal disease: Secondary | ICD-10-CM | POA: Diagnosis not present

## 2017-05-02 DIAGNOSIS — D631 Anemia in chronic kidney disease: Secondary | ICD-10-CM | POA: Diagnosis not present

## 2017-05-02 DIAGNOSIS — D509 Iron deficiency anemia, unspecified: Secondary | ICD-10-CM | POA: Diagnosis not present

## 2017-05-02 DIAGNOSIS — D689 Coagulation defect, unspecified: Secondary | ICD-10-CM | POA: Diagnosis not present

## 2017-05-02 DIAGNOSIS — N2581 Secondary hyperparathyroidism of renal origin: Secondary | ICD-10-CM | POA: Diagnosis not present

## 2017-05-02 DIAGNOSIS — E1129 Type 2 diabetes mellitus with other diabetic kidney complication: Secondary | ICD-10-CM | POA: Diagnosis not present

## 2017-05-02 DIAGNOSIS — Z283 Underimmunization status: Secondary | ICD-10-CM | POA: Diagnosis not present

## 2017-05-02 DIAGNOSIS — T8249XD Other complication of vascular dialysis catheter, subsequent encounter: Secondary | ICD-10-CM | POA: Diagnosis not present

## 2017-05-03 DIAGNOSIS — N186 End stage renal disease: Secondary | ICD-10-CM | POA: Diagnosis not present

## 2017-05-03 DIAGNOSIS — I132 Hypertensive heart and chronic kidney disease with heart failure and with stage 5 chronic kidney disease, or end stage renal disease: Secondary | ICD-10-CM | POA: Diagnosis not present

## 2017-05-03 DIAGNOSIS — D631 Anemia in chronic kidney disease: Secondary | ICD-10-CM | POA: Diagnosis not present

## 2017-05-03 DIAGNOSIS — E1122 Type 2 diabetes mellitus with diabetic chronic kidney disease: Secondary | ICD-10-CM | POA: Diagnosis not present

## 2017-05-03 DIAGNOSIS — I5043 Acute on chronic combined systolic (congestive) and diastolic (congestive) heart failure: Secondary | ICD-10-CM | POA: Diagnosis not present

## 2017-05-04 DIAGNOSIS — D689 Coagulation defect, unspecified: Secondary | ICD-10-CM | POA: Diagnosis not present

## 2017-05-04 DIAGNOSIS — N2581 Secondary hyperparathyroidism of renal origin: Secondary | ICD-10-CM | POA: Diagnosis not present

## 2017-05-04 DIAGNOSIS — N186 End stage renal disease: Secondary | ICD-10-CM | POA: Diagnosis not present

## 2017-05-04 DIAGNOSIS — Z283 Underimmunization status: Secondary | ICD-10-CM | POA: Diagnosis not present

## 2017-05-04 DIAGNOSIS — E1129 Type 2 diabetes mellitus with other diabetic kidney complication: Secondary | ICD-10-CM | POA: Diagnosis not present

## 2017-05-04 DIAGNOSIS — D631 Anemia in chronic kidney disease: Secondary | ICD-10-CM | POA: Diagnosis not present

## 2017-05-04 DIAGNOSIS — T8249XD Other complication of vascular dialysis catheter, subsequent encounter: Secondary | ICD-10-CM | POA: Diagnosis not present

## 2017-05-04 DIAGNOSIS — D509 Iron deficiency anemia, unspecified: Secondary | ICD-10-CM | POA: Diagnosis not present

## 2017-05-05 DIAGNOSIS — I132 Hypertensive heart and chronic kidney disease with heart failure and with stage 5 chronic kidney disease, or end stage renal disease: Secondary | ICD-10-CM | POA: Diagnosis not present

## 2017-05-05 DIAGNOSIS — I5043 Acute on chronic combined systolic (congestive) and diastolic (congestive) heart failure: Secondary | ICD-10-CM | POA: Diagnosis not present

## 2017-05-05 DIAGNOSIS — D631 Anemia in chronic kidney disease: Secondary | ICD-10-CM | POA: Diagnosis not present

## 2017-05-05 DIAGNOSIS — N186 End stage renal disease: Secondary | ICD-10-CM | POA: Diagnosis not present

## 2017-05-05 DIAGNOSIS — E1122 Type 2 diabetes mellitus with diabetic chronic kidney disease: Secondary | ICD-10-CM | POA: Diagnosis not present

## 2017-05-06 DIAGNOSIS — Z283 Underimmunization status: Secondary | ICD-10-CM | POA: Diagnosis not present

## 2017-05-06 DIAGNOSIS — N2581 Secondary hyperparathyroidism of renal origin: Secondary | ICD-10-CM | POA: Diagnosis not present

## 2017-05-06 DIAGNOSIS — T8249XD Other complication of vascular dialysis catheter, subsequent encounter: Secondary | ICD-10-CM | POA: Diagnosis not present

## 2017-05-06 DIAGNOSIS — D689 Coagulation defect, unspecified: Secondary | ICD-10-CM | POA: Diagnosis not present

## 2017-05-06 DIAGNOSIS — N186 End stage renal disease: Secondary | ICD-10-CM | POA: Diagnosis not present

## 2017-05-06 DIAGNOSIS — D509 Iron deficiency anemia, unspecified: Secondary | ICD-10-CM | POA: Diagnosis not present

## 2017-05-06 DIAGNOSIS — E1129 Type 2 diabetes mellitus with other diabetic kidney complication: Secondary | ICD-10-CM | POA: Diagnosis not present

## 2017-05-06 DIAGNOSIS — D631 Anemia in chronic kidney disease: Secondary | ICD-10-CM | POA: Diagnosis not present

## 2017-05-07 DIAGNOSIS — E1122 Type 2 diabetes mellitus with diabetic chronic kidney disease: Secondary | ICD-10-CM | POA: Diagnosis not present

## 2017-05-07 DIAGNOSIS — N186 End stage renal disease: Secondary | ICD-10-CM | POA: Diagnosis not present

## 2017-05-07 DIAGNOSIS — Z992 Dependence on renal dialysis: Secondary | ICD-10-CM | POA: Diagnosis not present

## 2017-05-08 DIAGNOSIS — E1122 Type 2 diabetes mellitus with diabetic chronic kidney disease: Secondary | ICD-10-CM | POA: Diagnosis not present

## 2017-05-08 DIAGNOSIS — N186 End stage renal disease: Secondary | ICD-10-CM | POA: Diagnosis not present

## 2017-05-08 DIAGNOSIS — D631 Anemia in chronic kidney disease: Secondary | ICD-10-CM | POA: Diagnosis not present

## 2017-05-08 DIAGNOSIS — I132 Hypertensive heart and chronic kidney disease with heart failure and with stage 5 chronic kidney disease, or end stage renal disease: Secondary | ICD-10-CM | POA: Diagnosis not present

## 2017-05-08 DIAGNOSIS — I5043 Acute on chronic combined systolic (congestive) and diastolic (congestive) heart failure: Secondary | ICD-10-CM | POA: Diagnosis not present

## 2017-05-09 DIAGNOSIS — D631 Anemia in chronic kidney disease: Secondary | ICD-10-CM | POA: Diagnosis not present

## 2017-05-09 DIAGNOSIS — N186 End stage renal disease: Secondary | ICD-10-CM | POA: Diagnosis not present

## 2017-05-09 DIAGNOSIS — N2581 Secondary hyperparathyroidism of renal origin: Secondary | ICD-10-CM | POA: Diagnosis not present

## 2017-05-09 DIAGNOSIS — D689 Coagulation defect, unspecified: Secondary | ICD-10-CM | POA: Diagnosis not present

## 2017-05-09 DIAGNOSIS — Z283 Underimmunization status: Secondary | ICD-10-CM | POA: Diagnosis not present

## 2017-05-09 DIAGNOSIS — E1129 Type 2 diabetes mellitus with other diabetic kidney complication: Secondary | ICD-10-CM | POA: Diagnosis not present

## 2017-05-09 DIAGNOSIS — T8249XD Other complication of vascular dialysis catheter, subsequent encounter: Secondary | ICD-10-CM | POA: Diagnosis not present

## 2017-05-11 ENCOUNTER — Encounter: Payer: Self-pay | Admitting: Vascular Surgery

## 2017-05-11 ENCOUNTER — Ambulatory Visit (HOSPITAL_COMMUNITY)
Admission: RE | Admit: 2017-05-11 | Discharge: 2017-05-11 | Disposition: A | Discharge status: Home / Self Care | Payer: Medicare Other | Source: Ambulatory Visit | Attending: Vascular Surgery | Admitting: Vascular Surgery

## 2017-05-11 ENCOUNTER — Ambulatory Visit (INDEPENDENT_AMBULATORY_CARE_PROVIDER_SITE_OTHER): Payer: Self-pay | Admitting: Vascular Surgery

## 2017-05-11 VITALS — BP 139/62 | HR 67 | Resp 16 | Ht 60.0 in | Wt 178.6 lb

## 2017-05-11 DIAGNOSIS — E1129 Type 2 diabetes mellitus with other diabetic kidney complication: Secondary | ICD-10-CM | POA: Diagnosis not present

## 2017-05-11 DIAGNOSIS — N185 Chronic kidney disease, stage 5: Secondary | ICD-10-CM | POA: Diagnosis not present

## 2017-05-11 DIAGNOSIS — Z992 Dependence on renal dialysis: Secondary | ICD-10-CM

## 2017-05-11 DIAGNOSIS — D689 Coagulation defect, unspecified: Secondary | ICD-10-CM | POA: Diagnosis not present

## 2017-05-11 DIAGNOSIS — I770 Arteriovenous fistula, acquired: Secondary | ICD-10-CM | POA: Diagnosis not present

## 2017-05-11 DIAGNOSIS — N2581 Secondary hyperparathyroidism of renal origin: Secondary | ICD-10-CM | POA: Diagnosis not present

## 2017-05-11 DIAGNOSIS — N186 End stage renal disease: Secondary | ICD-10-CM

## 2017-05-11 DIAGNOSIS — Z283 Underimmunization status: Secondary | ICD-10-CM | POA: Diagnosis not present

## 2017-05-11 DIAGNOSIS — D631 Anemia in chronic kidney disease: Secondary | ICD-10-CM | POA: Diagnosis not present

## 2017-05-11 DIAGNOSIS — T8249XD Other complication of vascular dialysis catheter, subsequent encounter: Secondary | ICD-10-CM | POA: Diagnosis not present

## 2017-05-11 NOTE — Progress Notes (Signed)
Patient is a 71 year old female who had a left brachiocephalic AV fistula placed in 02/10/2017. She currently is dialyzing via a catheter. She returns today for follow-up. She denies any numbness or tingling in her hand.  Review of systems: She denies shortness of breath. She denies chest pain.  Physical exam:  Vitals:   05/11/17 0906  BP: 139/62  Pulse: 67  Resp: 16  SpO2: 100%  Weight: 178 lb 9.6 oz (81 kg)  Height: 5' (1.524 m)    Left upper extremity: Palpable thrill in fistula it is palpable over the proximal one third of the fistula but becomes deeper in the upper arm.  Data: Duplex ultrasound of AV fistula was performed today. This shows diameter the fistula is 7-14 mm. It is 1 cm in depth from the mid upper arm to the shoulder. It is 4-5 mm in depth from the antecubital crease to the proximal third of the upper arm.  Assessment: Mature AV fistula left arm. This may be slightly deep.  Plan: The patient will have her dialysis center evaluate the fistula today. If they think they can cannulate this. It is okay to begin cannulation at this point. However, if they think the fistula is too deep we will schedule her for a Superficialization in the near future. Otherwise the patient will follow-up on as-needed basis.  [name], MD [facility] Office: [phone] Pager: [phone]

## 2017-05-12 DIAGNOSIS — D631 Anemia in chronic kidney disease: Secondary | ICD-10-CM | POA: Diagnosis not present

## 2017-05-12 DIAGNOSIS — E1122 Type 2 diabetes mellitus with diabetic chronic kidney disease: Secondary | ICD-10-CM | POA: Diagnosis not present

## 2017-05-12 DIAGNOSIS — I5043 Acute on chronic combined systolic (congestive) and diastolic (congestive) heart failure: Secondary | ICD-10-CM | POA: Diagnosis not present

## 2017-05-12 DIAGNOSIS — I132 Hypertensive heart and chronic kidney disease with heart failure and with stage 5 chronic kidney disease, or end stage renal disease: Secondary | ICD-10-CM | POA: Diagnosis not present

## 2017-05-12 DIAGNOSIS — N186 End stage renal disease: Secondary | ICD-10-CM | POA: Diagnosis not present

## 2017-05-13 DIAGNOSIS — Z283 Underimmunization status: Secondary | ICD-10-CM | POA: Diagnosis not present

## 2017-05-13 DIAGNOSIS — D631 Anemia in chronic kidney disease: Secondary | ICD-10-CM | POA: Diagnosis not present

## 2017-05-13 DIAGNOSIS — D689 Coagulation defect, unspecified: Secondary | ICD-10-CM | POA: Diagnosis not present

## 2017-05-13 DIAGNOSIS — N2581 Secondary hyperparathyroidism of renal origin: Secondary | ICD-10-CM | POA: Diagnosis not present

## 2017-05-13 DIAGNOSIS — T8249XD Other complication of vascular dialysis catheter, subsequent encounter: Secondary | ICD-10-CM | POA: Diagnosis not present

## 2017-05-13 DIAGNOSIS — E1129 Type 2 diabetes mellitus with other diabetic kidney complication: Secondary | ICD-10-CM | POA: Diagnosis not present

## 2017-05-13 DIAGNOSIS — N186 End stage renal disease: Secondary | ICD-10-CM | POA: Diagnosis not present

## 2017-05-15 DIAGNOSIS — H409 Unspecified glaucoma: Secondary | ICD-10-CM | POA: Diagnosis not present

## 2017-05-15 DIAGNOSIS — Z794 Long term (current) use of insulin: Secondary | ICD-10-CM | POA: Diagnosis not present

## 2017-05-15 DIAGNOSIS — M8580 Other specified disorders of bone density and structure, unspecified site: Secondary | ICD-10-CM | POA: Diagnosis not present

## 2017-05-15 DIAGNOSIS — E43 Unspecified severe protein-calorie malnutrition: Secondary | ICD-10-CM | POA: Diagnosis not present

## 2017-05-15 DIAGNOSIS — D631 Anemia in chronic kidney disease: Secondary | ICD-10-CM | POA: Diagnosis not present

## 2017-05-15 DIAGNOSIS — E1122 Type 2 diabetes mellitus with diabetic chronic kidney disease: Secondary | ICD-10-CM | POA: Diagnosis not present

## 2017-05-15 DIAGNOSIS — I2510 Atherosclerotic heart disease of native coronary artery without angina pectoris: Secondary | ICD-10-CM | POA: Diagnosis not present

## 2017-05-15 DIAGNOSIS — S81801D Unspecified open wound, right lower leg, subsequent encounter: Secondary | ICD-10-CM | POA: Diagnosis not present

## 2017-05-15 DIAGNOSIS — I132 Hypertensive heart and chronic kidney disease with heart failure and with stage 5 chronic kidney disease, or end stage renal disease: Secondary | ICD-10-CM | POA: Diagnosis not present

## 2017-05-15 DIAGNOSIS — M170 Bilateral primary osteoarthritis of knee: Secondary | ICD-10-CM | POA: Diagnosis not present

## 2017-05-15 DIAGNOSIS — J9610 Chronic respiratory failure, unspecified whether with hypoxia or hypercapnia: Secondary | ICD-10-CM | POA: Diagnosis not present

## 2017-05-15 DIAGNOSIS — I5043 Acute on chronic combined systolic (congestive) and diastolic (congestive) heart failure: Secondary | ICD-10-CM | POA: Diagnosis not present

## 2017-05-15 DIAGNOSIS — N186 End stage renal disease: Secondary | ICD-10-CM | POA: Diagnosis not present

## 2017-05-16 ENCOUNTER — Encounter: Payer: Self-pay | Admitting: Nephrology

## 2017-05-16 DIAGNOSIS — Z283 Underimmunization status: Secondary | ICD-10-CM | POA: Diagnosis not present

## 2017-05-16 DIAGNOSIS — N186 End stage renal disease: Secondary | ICD-10-CM | POA: Diagnosis not present

## 2017-05-16 DIAGNOSIS — D689 Coagulation defect, unspecified: Secondary | ICD-10-CM | POA: Diagnosis not present

## 2017-05-16 DIAGNOSIS — D631 Anemia in chronic kidney disease: Secondary | ICD-10-CM | POA: Diagnosis not present

## 2017-05-16 DIAGNOSIS — N2581 Secondary hyperparathyroidism of renal origin: Secondary | ICD-10-CM | POA: Diagnosis not present

## 2017-05-16 DIAGNOSIS — E1129 Type 2 diabetes mellitus with other diabetic kidney complication: Secondary | ICD-10-CM | POA: Diagnosis not present

## 2017-05-16 DIAGNOSIS — T8249XD Other complication of vascular dialysis catheter, subsequent encounter: Secondary | ICD-10-CM | POA: Diagnosis not present

## 2017-05-17 DIAGNOSIS — E43 Unspecified severe protein-calorie malnutrition: Secondary | ICD-10-CM | POA: Diagnosis not present

## 2017-05-17 DIAGNOSIS — I2510 Atherosclerotic heart disease of native coronary artery without angina pectoris: Secondary | ICD-10-CM | POA: Diagnosis not present

## 2017-05-17 DIAGNOSIS — M8580 Other specified disorders of bone density and structure, unspecified site: Secondary | ICD-10-CM | POA: Diagnosis not present

## 2017-05-17 DIAGNOSIS — H409 Unspecified glaucoma: Secondary | ICD-10-CM | POA: Diagnosis not present

## 2017-05-17 DIAGNOSIS — I132 Hypertensive heart and chronic kidney disease with heart failure and with stage 5 chronic kidney disease, or end stage renal disease: Secondary | ICD-10-CM | POA: Diagnosis not present

## 2017-05-17 DIAGNOSIS — D631 Anemia in chronic kidney disease: Secondary | ICD-10-CM | POA: Diagnosis not present

## 2017-05-17 DIAGNOSIS — J9610 Chronic respiratory failure, unspecified whether with hypoxia or hypercapnia: Secondary | ICD-10-CM | POA: Diagnosis not present

## 2017-05-17 DIAGNOSIS — E1122 Type 2 diabetes mellitus with diabetic chronic kidney disease: Secondary | ICD-10-CM | POA: Diagnosis not present

## 2017-05-17 DIAGNOSIS — N186 End stage renal disease: Secondary | ICD-10-CM | POA: Diagnosis not present

## 2017-05-17 DIAGNOSIS — Z794 Long term (current) use of insulin: Secondary | ICD-10-CM | POA: Diagnosis not present

## 2017-05-17 DIAGNOSIS — I5043 Acute on chronic combined systolic (congestive) and diastolic (congestive) heart failure: Secondary | ICD-10-CM | POA: Diagnosis not present

## 2017-05-17 DIAGNOSIS — S81801D Unspecified open wound, right lower leg, subsequent encounter: Secondary | ICD-10-CM | POA: Diagnosis not present

## 2017-05-17 DIAGNOSIS — M170 Bilateral primary osteoarthritis of knee: Secondary | ICD-10-CM | POA: Diagnosis not present

## 2017-05-18 DIAGNOSIS — D631 Anemia in chronic kidney disease: Secondary | ICD-10-CM | POA: Diagnosis not present

## 2017-05-18 DIAGNOSIS — Z283 Underimmunization status: Secondary | ICD-10-CM | POA: Diagnosis not present

## 2017-05-18 DIAGNOSIS — N2581 Secondary hyperparathyroidism of renal origin: Secondary | ICD-10-CM | POA: Diagnosis not present

## 2017-05-18 DIAGNOSIS — N186 End stage renal disease: Secondary | ICD-10-CM | POA: Diagnosis not present

## 2017-05-18 DIAGNOSIS — D689 Coagulation defect, unspecified: Secondary | ICD-10-CM | POA: Diagnosis not present

## 2017-05-18 DIAGNOSIS — T8249XD Other complication of vascular dialysis catheter, subsequent encounter: Secondary | ICD-10-CM | POA: Diagnosis not present

## 2017-05-18 DIAGNOSIS — E1129 Type 2 diabetes mellitus with other diabetic kidney complication: Secondary | ICD-10-CM | POA: Diagnosis not present

## 2017-05-19 DIAGNOSIS — Z720 Tobacco use: Secondary | ICD-10-CM | POA: Diagnosis not present

## 2017-05-19 DIAGNOSIS — I10 Essential (primary) hypertension: Secondary | ICD-10-CM | POA: Diagnosis not present

## 2017-05-19 DIAGNOSIS — Z01118 Encounter for examination of ears and hearing with other abnormal findings: Secondary | ICD-10-CM | POA: Diagnosis not present

## 2017-05-19 DIAGNOSIS — E119 Type 2 diabetes mellitus without complications: Secondary | ICD-10-CM | POA: Diagnosis not present

## 2017-05-19 DIAGNOSIS — R011 Cardiac murmur, unspecified: Secondary | ICD-10-CM | POA: Diagnosis not present

## 2017-05-19 DIAGNOSIS — H538 Other visual disturbances: Secondary | ICD-10-CM | POA: Diagnosis not present

## 2017-05-19 DIAGNOSIS — E785 Hyperlipidemia, unspecified: Secondary | ICD-10-CM | POA: Diagnosis not present

## 2017-05-20 DIAGNOSIS — N186 End stage renal disease: Secondary | ICD-10-CM | POA: Diagnosis not present

## 2017-05-20 DIAGNOSIS — Z283 Underimmunization status: Secondary | ICD-10-CM | POA: Diagnosis not present

## 2017-05-20 DIAGNOSIS — N2581 Secondary hyperparathyroidism of renal origin: Secondary | ICD-10-CM | POA: Diagnosis not present

## 2017-05-20 DIAGNOSIS — E1129 Type 2 diabetes mellitus with other diabetic kidney complication: Secondary | ICD-10-CM | POA: Diagnosis not present

## 2017-05-20 DIAGNOSIS — T8249XD Other complication of vascular dialysis catheter, subsequent encounter: Secondary | ICD-10-CM | POA: Diagnosis not present

## 2017-05-20 DIAGNOSIS — D631 Anemia in chronic kidney disease: Secondary | ICD-10-CM | POA: Diagnosis not present

## 2017-05-20 DIAGNOSIS — D689 Coagulation defect, unspecified: Secondary | ICD-10-CM | POA: Diagnosis not present

## 2017-05-22 DIAGNOSIS — I132 Hypertensive heart and chronic kidney disease with heart failure and with stage 5 chronic kidney disease, or end stage renal disease: Secondary | ICD-10-CM | POA: Diagnosis not present

## 2017-05-22 DIAGNOSIS — I5043 Acute on chronic combined systolic (congestive) and diastolic (congestive) heart failure: Secondary | ICD-10-CM | POA: Diagnosis not present

## 2017-05-22 DIAGNOSIS — H409 Unspecified glaucoma: Secondary | ICD-10-CM | POA: Diagnosis not present

## 2017-05-22 DIAGNOSIS — E1122 Type 2 diabetes mellitus with diabetic chronic kidney disease: Secondary | ICD-10-CM | POA: Diagnosis not present

## 2017-05-22 DIAGNOSIS — D631 Anemia in chronic kidney disease: Secondary | ICD-10-CM | POA: Diagnosis not present

## 2017-05-22 DIAGNOSIS — I2510 Atherosclerotic heart disease of native coronary artery without angina pectoris: Secondary | ICD-10-CM | POA: Diagnosis not present

## 2017-05-22 DIAGNOSIS — S81801D Unspecified open wound, right lower leg, subsequent encounter: Secondary | ICD-10-CM | POA: Diagnosis not present

## 2017-05-22 DIAGNOSIS — J9610 Chronic respiratory failure, unspecified whether with hypoxia or hypercapnia: Secondary | ICD-10-CM | POA: Diagnosis not present

## 2017-05-22 DIAGNOSIS — M8580 Other specified disorders of bone density and structure, unspecified site: Secondary | ICD-10-CM | POA: Diagnosis not present

## 2017-05-22 DIAGNOSIS — N186 End stage renal disease: Secondary | ICD-10-CM | POA: Diagnosis not present

## 2017-05-22 DIAGNOSIS — M170 Bilateral primary osteoarthritis of knee: Secondary | ICD-10-CM | POA: Diagnosis not present

## 2017-05-22 DIAGNOSIS — E43 Unspecified severe protein-calorie malnutrition: Secondary | ICD-10-CM | POA: Diagnosis not present

## 2017-05-22 DIAGNOSIS — Z794 Long term (current) use of insulin: Secondary | ICD-10-CM | POA: Diagnosis not present

## 2017-05-23 DIAGNOSIS — T8249XD Other complication of vascular dialysis catheter, subsequent encounter: Secondary | ICD-10-CM | POA: Diagnosis not present

## 2017-05-23 DIAGNOSIS — N186 End stage renal disease: Secondary | ICD-10-CM | POA: Diagnosis not present

## 2017-05-23 DIAGNOSIS — Z283 Underimmunization status: Secondary | ICD-10-CM | POA: Diagnosis not present

## 2017-05-23 DIAGNOSIS — N2581 Secondary hyperparathyroidism of renal origin: Secondary | ICD-10-CM | POA: Diagnosis not present

## 2017-05-23 DIAGNOSIS — D689 Coagulation defect, unspecified: Secondary | ICD-10-CM | POA: Diagnosis not present

## 2017-05-23 DIAGNOSIS — E1129 Type 2 diabetes mellitus with other diabetic kidney complication: Secondary | ICD-10-CM | POA: Diagnosis not present

## 2017-05-23 DIAGNOSIS — D631 Anemia in chronic kidney disease: Secondary | ICD-10-CM | POA: Diagnosis not present

## 2017-05-24 DIAGNOSIS — M170 Bilateral primary osteoarthritis of knee: Secondary | ICD-10-CM | POA: Diagnosis not present

## 2017-05-24 DIAGNOSIS — H409 Unspecified glaucoma: Secondary | ICD-10-CM | POA: Diagnosis not present

## 2017-05-24 DIAGNOSIS — Z794 Long term (current) use of insulin: Secondary | ICD-10-CM | POA: Diagnosis not present

## 2017-05-24 DIAGNOSIS — S81801D Unspecified open wound, right lower leg, subsequent encounter: Secondary | ICD-10-CM | POA: Diagnosis not present

## 2017-05-24 DIAGNOSIS — I132 Hypertensive heart and chronic kidney disease with heart failure and with stage 5 chronic kidney disease, or end stage renal disease: Secondary | ICD-10-CM | POA: Diagnosis not present

## 2017-05-24 DIAGNOSIS — I2510 Atherosclerotic heart disease of native coronary artery without angina pectoris: Secondary | ICD-10-CM | POA: Diagnosis not present

## 2017-05-24 DIAGNOSIS — I5043 Acute on chronic combined systolic (congestive) and diastolic (congestive) heart failure: Secondary | ICD-10-CM | POA: Diagnosis not present

## 2017-05-24 DIAGNOSIS — D631 Anemia in chronic kidney disease: Secondary | ICD-10-CM | POA: Diagnosis not present

## 2017-05-24 DIAGNOSIS — N186 End stage renal disease: Secondary | ICD-10-CM | POA: Diagnosis not present

## 2017-05-24 DIAGNOSIS — E1122 Type 2 diabetes mellitus with diabetic chronic kidney disease: Secondary | ICD-10-CM | POA: Diagnosis not present

## 2017-05-24 DIAGNOSIS — E43 Unspecified severe protein-calorie malnutrition: Secondary | ICD-10-CM | POA: Diagnosis not present

## 2017-05-24 DIAGNOSIS — M8580 Other specified disorders of bone density and structure, unspecified site: Secondary | ICD-10-CM | POA: Diagnosis not present

## 2017-05-24 DIAGNOSIS — J9610 Chronic respiratory failure, unspecified whether with hypoxia or hypercapnia: Secondary | ICD-10-CM | POA: Diagnosis not present

## 2017-05-25 DIAGNOSIS — E43 Unspecified severe protein-calorie malnutrition: Secondary | ICD-10-CM | POA: Diagnosis not present

## 2017-05-25 DIAGNOSIS — T8249XD Other complication of vascular dialysis catheter, subsequent encounter: Secondary | ICD-10-CM | POA: Diagnosis not present

## 2017-05-25 DIAGNOSIS — N186 End stage renal disease: Secondary | ICD-10-CM | POA: Diagnosis not present

## 2017-05-25 DIAGNOSIS — S81801D Unspecified open wound, right lower leg, subsequent encounter: Secondary | ICD-10-CM | POA: Diagnosis not present

## 2017-05-25 DIAGNOSIS — E1129 Type 2 diabetes mellitus with other diabetic kidney complication: Secondary | ICD-10-CM | POA: Diagnosis not present

## 2017-05-25 DIAGNOSIS — N2581 Secondary hyperparathyroidism of renal origin: Secondary | ICD-10-CM | POA: Diagnosis not present

## 2017-05-25 DIAGNOSIS — H409 Unspecified glaucoma: Secondary | ICD-10-CM | POA: Diagnosis not present

## 2017-05-25 DIAGNOSIS — Z794 Long term (current) use of insulin: Secondary | ICD-10-CM | POA: Diagnosis not present

## 2017-05-25 DIAGNOSIS — J9610 Chronic respiratory failure, unspecified whether with hypoxia or hypercapnia: Secondary | ICD-10-CM | POA: Diagnosis not present

## 2017-05-25 DIAGNOSIS — I5043 Acute on chronic combined systolic (congestive) and diastolic (congestive) heart failure: Secondary | ICD-10-CM | POA: Diagnosis not present

## 2017-05-25 DIAGNOSIS — D689 Coagulation defect, unspecified: Secondary | ICD-10-CM | POA: Diagnosis not present

## 2017-05-25 DIAGNOSIS — I132 Hypertensive heart and chronic kidney disease with heart failure and with stage 5 chronic kidney disease, or end stage renal disease: Secondary | ICD-10-CM | POA: Diagnosis not present

## 2017-05-25 DIAGNOSIS — M170 Bilateral primary osteoarthritis of knee: Secondary | ICD-10-CM | POA: Diagnosis not present

## 2017-05-25 DIAGNOSIS — E1122 Type 2 diabetes mellitus with diabetic chronic kidney disease: Secondary | ICD-10-CM | POA: Diagnosis not present

## 2017-05-25 DIAGNOSIS — Z283 Underimmunization status: Secondary | ICD-10-CM | POA: Diagnosis not present

## 2017-05-25 DIAGNOSIS — I2510 Atherosclerotic heart disease of native coronary artery without angina pectoris: Secondary | ICD-10-CM | POA: Diagnosis not present

## 2017-05-25 DIAGNOSIS — M8580 Other specified disorders of bone density and structure, unspecified site: Secondary | ICD-10-CM | POA: Diagnosis not present

## 2017-05-25 DIAGNOSIS — D631 Anemia in chronic kidney disease: Secondary | ICD-10-CM | POA: Diagnosis not present

## 2017-05-27 DIAGNOSIS — E1129 Type 2 diabetes mellitus with other diabetic kidney complication: Secondary | ICD-10-CM | POA: Diagnosis not present

## 2017-05-27 DIAGNOSIS — Z283 Underimmunization status: Secondary | ICD-10-CM | POA: Diagnosis not present

## 2017-05-27 DIAGNOSIS — N186 End stage renal disease: Secondary | ICD-10-CM | POA: Diagnosis not present

## 2017-05-27 DIAGNOSIS — D689 Coagulation defect, unspecified: Secondary | ICD-10-CM | POA: Diagnosis not present

## 2017-05-27 DIAGNOSIS — D631 Anemia in chronic kidney disease: Secondary | ICD-10-CM | POA: Diagnosis not present

## 2017-05-27 DIAGNOSIS — N2581 Secondary hyperparathyroidism of renal origin: Secondary | ICD-10-CM | POA: Diagnosis not present

## 2017-05-27 DIAGNOSIS — T8249XD Other complication of vascular dialysis catheter, subsequent encounter: Secondary | ICD-10-CM | POA: Diagnosis not present

## 2017-05-29 DIAGNOSIS — E43 Unspecified severe protein-calorie malnutrition: Secondary | ICD-10-CM | POA: Diagnosis not present

## 2017-05-29 DIAGNOSIS — Z794 Long term (current) use of insulin: Secondary | ICD-10-CM | POA: Diagnosis not present

## 2017-05-29 DIAGNOSIS — H409 Unspecified glaucoma: Secondary | ICD-10-CM | POA: Diagnosis not present

## 2017-05-29 DIAGNOSIS — M170 Bilateral primary osteoarthritis of knee: Secondary | ICD-10-CM | POA: Diagnosis not present

## 2017-05-29 DIAGNOSIS — N186 End stage renal disease: Secondary | ICD-10-CM | POA: Diagnosis not present

## 2017-05-29 DIAGNOSIS — D631 Anemia in chronic kidney disease: Secondary | ICD-10-CM | POA: Diagnosis not present

## 2017-05-29 DIAGNOSIS — I2510 Atherosclerotic heart disease of native coronary artery without angina pectoris: Secondary | ICD-10-CM | POA: Diagnosis not present

## 2017-05-29 DIAGNOSIS — I132 Hypertensive heart and chronic kidney disease with heart failure and with stage 5 chronic kidney disease, or end stage renal disease: Secondary | ICD-10-CM | POA: Diagnosis not present

## 2017-05-29 DIAGNOSIS — J9610 Chronic respiratory failure, unspecified whether with hypoxia or hypercapnia: Secondary | ICD-10-CM | POA: Diagnosis not present

## 2017-05-29 DIAGNOSIS — M8580 Other specified disorders of bone density and structure, unspecified site: Secondary | ICD-10-CM | POA: Diagnosis not present

## 2017-05-29 DIAGNOSIS — S81801D Unspecified open wound, right lower leg, subsequent encounter: Secondary | ICD-10-CM | POA: Diagnosis not present

## 2017-05-29 DIAGNOSIS — I5043 Acute on chronic combined systolic (congestive) and diastolic (congestive) heart failure: Secondary | ICD-10-CM | POA: Diagnosis not present

## 2017-05-29 DIAGNOSIS — E1122 Type 2 diabetes mellitus with diabetic chronic kidney disease: Secondary | ICD-10-CM | POA: Diagnosis not present

## 2017-05-30 DIAGNOSIS — T8249XD Other complication of vascular dialysis catheter, subsequent encounter: Secondary | ICD-10-CM | POA: Diagnosis not present

## 2017-05-30 DIAGNOSIS — N2581 Secondary hyperparathyroidism of renal origin: Secondary | ICD-10-CM | POA: Diagnosis not present

## 2017-05-30 DIAGNOSIS — N186 End stage renal disease: Secondary | ICD-10-CM | POA: Diagnosis not present

## 2017-05-30 DIAGNOSIS — D631 Anemia in chronic kidney disease: Secondary | ICD-10-CM | POA: Diagnosis not present

## 2017-05-30 DIAGNOSIS — Z283 Underimmunization status: Secondary | ICD-10-CM | POA: Diagnosis not present

## 2017-05-30 DIAGNOSIS — E1129 Type 2 diabetes mellitus with other diabetic kidney complication: Secondary | ICD-10-CM | POA: Diagnosis not present

## 2017-05-30 DIAGNOSIS — D689 Coagulation defect, unspecified: Secondary | ICD-10-CM | POA: Diagnosis not present

## 2017-06-01 DIAGNOSIS — N2581 Secondary hyperparathyroidism of renal origin: Secondary | ICD-10-CM | POA: Diagnosis not present

## 2017-06-01 DIAGNOSIS — E1129 Type 2 diabetes mellitus with other diabetic kidney complication: Secondary | ICD-10-CM | POA: Diagnosis not present

## 2017-06-01 DIAGNOSIS — Z283 Underimmunization status: Secondary | ICD-10-CM | POA: Diagnosis not present

## 2017-06-01 DIAGNOSIS — T8249XD Other complication of vascular dialysis catheter, subsequent encounter: Secondary | ICD-10-CM | POA: Diagnosis not present

## 2017-06-01 DIAGNOSIS — N186 End stage renal disease: Secondary | ICD-10-CM | POA: Diagnosis not present

## 2017-06-01 DIAGNOSIS — D631 Anemia in chronic kidney disease: Secondary | ICD-10-CM | POA: Diagnosis not present

## 2017-06-01 DIAGNOSIS — D689 Coagulation defect, unspecified: Secondary | ICD-10-CM | POA: Diagnosis not present

## 2017-06-02 DIAGNOSIS — M8580 Other specified disorders of bone density and structure, unspecified site: Secondary | ICD-10-CM | POA: Diagnosis not present

## 2017-06-02 DIAGNOSIS — D631 Anemia in chronic kidney disease: Secondary | ICD-10-CM | POA: Diagnosis not present

## 2017-06-02 DIAGNOSIS — S81801D Unspecified open wound, right lower leg, subsequent encounter: Secondary | ICD-10-CM | POA: Diagnosis not present

## 2017-06-02 DIAGNOSIS — N186 End stage renal disease: Secondary | ICD-10-CM | POA: Diagnosis not present

## 2017-06-02 DIAGNOSIS — E1122 Type 2 diabetes mellitus with diabetic chronic kidney disease: Secondary | ICD-10-CM | POA: Diagnosis not present

## 2017-06-02 DIAGNOSIS — I5043 Acute on chronic combined systolic (congestive) and diastolic (congestive) heart failure: Secondary | ICD-10-CM | POA: Diagnosis not present

## 2017-06-02 DIAGNOSIS — E43 Unspecified severe protein-calorie malnutrition: Secondary | ICD-10-CM | POA: Diagnosis not present

## 2017-06-02 DIAGNOSIS — Z794 Long term (current) use of insulin: Secondary | ICD-10-CM | POA: Diagnosis not present

## 2017-06-02 DIAGNOSIS — H409 Unspecified glaucoma: Secondary | ICD-10-CM | POA: Diagnosis not present

## 2017-06-02 DIAGNOSIS — I132 Hypertensive heart and chronic kidney disease with heart failure and with stage 5 chronic kidney disease, or end stage renal disease: Secondary | ICD-10-CM | POA: Diagnosis not present

## 2017-06-02 DIAGNOSIS — J9610 Chronic respiratory failure, unspecified whether with hypoxia or hypercapnia: Secondary | ICD-10-CM | POA: Diagnosis not present

## 2017-06-02 DIAGNOSIS — I2510 Atherosclerotic heart disease of native coronary artery without angina pectoris: Secondary | ICD-10-CM | POA: Diagnosis not present

## 2017-06-02 DIAGNOSIS — M170 Bilateral primary osteoarthritis of knee: Secondary | ICD-10-CM | POA: Diagnosis not present

## 2017-06-03 DIAGNOSIS — N2581 Secondary hyperparathyroidism of renal origin: Secondary | ICD-10-CM | POA: Diagnosis not present

## 2017-06-03 DIAGNOSIS — E1129 Type 2 diabetes mellitus with other diabetic kidney complication: Secondary | ICD-10-CM | POA: Diagnosis not present

## 2017-06-03 DIAGNOSIS — D631 Anemia in chronic kidney disease: Secondary | ICD-10-CM | POA: Diagnosis not present

## 2017-06-03 DIAGNOSIS — N186 End stage renal disease: Secondary | ICD-10-CM | POA: Diagnosis not present

## 2017-06-03 DIAGNOSIS — Z283 Underimmunization status: Secondary | ICD-10-CM | POA: Diagnosis not present

## 2017-06-03 DIAGNOSIS — D689 Coagulation defect, unspecified: Secondary | ICD-10-CM | POA: Diagnosis not present

## 2017-06-03 DIAGNOSIS — T8249XD Other complication of vascular dialysis catheter, subsequent encounter: Secondary | ICD-10-CM | POA: Diagnosis not present

## 2017-06-05 DIAGNOSIS — M8580 Other specified disorders of bone density and structure, unspecified site: Secondary | ICD-10-CM | POA: Diagnosis not present

## 2017-06-05 DIAGNOSIS — I132 Hypertensive heart and chronic kidney disease with heart failure and with stage 5 chronic kidney disease, or end stage renal disease: Secondary | ICD-10-CM | POA: Diagnosis not present

## 2017-06-05 DIAGNOSIS — D631 Anemia in chronic kidney disease: Secondary | ICD-10-CM | POA: Diagnosis not present

## 2017-06-05 DIAGNOSIS — J9610 Chronic respiratory failure, unspecified whether with hypoxia or hypercapnia: Secondary | ICD-10-CM | POA: Diagnosis not present

## 2017-06-05 DIAGNOSIS — E43 Unspecified severe protein-calorie malnutrition: Secondary | ICD-10-CM | POA: Diagnosis not present

## 2017-06-05 DIAGNOSIS — Z794 Long term (current) use of insulin: Secondary | ICD-10-CM | POA: Diagnosis not present

## 2017-06-05 DIAGNOSIS — H409 Unspecified glaucoma: Secondary | ICD-10-CM | POA: Diagnosis not present

## 2017-06-05 DIAGNOSIS — E1122 Type 2 diabetes mellitus with diabetic chronic kidney disease: Secondary | ICD-10-CM | POA: Diagnosis not present

## 2017-06-05 DIAGNOSIS — S81801D Unspecified open wound, right lower leg, subsequent encounter: Secondary | ICD-10-CM | POA: Diagnosis not present

## 2017-06-05 DIAGNOSIS — M170 Bilateral primary osteoarthritis of knee: Secondary | ICD-10-CM | POA: Diagnosis not present

## 2017-06-05 DIAGNOSIS — I5043 Acute on chronic combined systolic (congestive) and diastolic (congestive) heart failure: Secondary | ICD-10-CM | POA: Diagnosis not present

## 2017-06-05 DIAGNOSIS — I2510 Atherosclerotic heart disease of native coronary artery without angina pectoris: Secondary | ICD-10-CM | POA: Diagnosis not present

## 2017-06-05 DIAGNOSIS — N186 End stage renal disease: Secondary | ICD-10-CM | POA: Diagnosis not present

## 2017-06-06 DIAGNOSIS — T8249XD Other complication of vascular dialysis catheter, subsequent encounter: Secondary | ICD-10-CM | POA: Diagnosis not present

## 2017-06-06 DIAGNOSIS — N2581 Secondary hyperparathyroidism of renal origin: Secondary | ICD-10-CM | POA: Diagnosis not present

## 2017-06-06 DIAGNOSIS — E1129 Type 2 diabetes mellitus with other diabetic kidney complication: Secondary | ICD-10-CM | POA: Diagnosis not present

## 2017-06-06 DIAGNOSIS — N186 End stage renal disease: Secondary | ICD-10-CM | POA: Diagnosis not present

## 2017-06-06 DIAGNOSIS — Z283 Underimmunization status: Secondary | ICD-10-CM | POA: Diagnosis not present

## 2017-06-06 DIAGNOSIS — D689 Coagulation defect, unspecified: Secondary | ICD-10-CM | POA: Diagnosis not present

## 2017-06-06 DIAGNOSIS — D631 Anemia in chronic kidney disease: Secondary | ICD-10-CM | POA: Diagnosis not present

## 2017-06-07 DIAGNOSIS — D631 Anemia in chronic kidney disease: Secondary | ICD-10-CM | POA: Diagnosis not present

## 2017-06-07 DIAGNOSIS — E43 Unspecified severe protein-calorie malnutrition: Secondary | ICD-10-CM | POA: Diagnosis not present

## 2017-06-07 DIAGNOSIS — I132 Hypertensive heart and chronic kidney disease with heart failure and with stage 5 chronic kidney disease, or end stage renal disease: Secondary | ICD-10-CM | POA: Diagnosis not present

## 2017-06-07 DIAGNOSIS — E1122 Type 2 diabetes mellitus with diabetic chronic kidney disease: Secondary | ICD-10-CM | POA: Diagnosis not present

## 2017-06-07 DIAGNOSIS — M8580 Other specified disorders of bone density and structure, unspecified site: Secondary | ICD-10-CM | POA: Diagnosis not present

## 2017-06-07 DIAGNOSIS — N186 End stage renal disease: Secondary | ICD-10-CM | POA: Diagnosis not present

## 2017-06-07 DIAGNOSIS — J9610 Chronic respiratory failure, unspecified whether with hypoxia or hypercapnia: Secondary | ICD-10-CM | POA: Diagnosis not present

## 2017-06-07 DIAGNOSIS — Z992 Dependence on renal dialysis: Secondary | ICD-10-CM | POA: Diagnosis not present

## 2017-06-07 DIAGNOSIS — I5043 Acute on chronic combined systolic (congestive) and diastolic (congestive) heart failure: Secondary | ICD-10-CM | POA: Diagnosis not present

## 2017-06-07 DIAGNOSIS — S81801D Unspecified open wound, right lower leg, subsequent encounter: Secondary | ICD-10-CM | POA: Diagnosis not present

## 2017-06-07 DIAGNOSIS — I2510 Atherosclerotic heart disease of native coronary artery without angina pectoris: Secondary | ICD-10-CM | POA: Diagnosis not present

## 2017-06-07 DIAGNOSIS — Z794 Long term (current) use of insulin: Secondary | ICD-10-CM | POA: Diagnosis not present

## 2017-06-07 DIAGNOSIS — M170 Bilateral primary osteoarthritis of knee: Secondary | ICD-10-CM | POA: Diagnosis not present

## 2017-06-07 DIAGNOSIS — H409 Unspecified glaucoma: Secondary | ICD-10-CM | POA: Diagnosis not present

## 2017-06-08 DIAGNOSIS — T8249XD Other complication of vascular dialysis catheter, subsequent encounter: Secondary | ICD-10-CM | POA: Diagnosis not present

## 2017-06-08 DIAGNOSIS — E1129 Type 2 diabetes mellitus with other diabetic kidney complication: Secondary | ICD-10-CM | POA: Diagnosis not present

## 2017-06-08 DIAGNOSIS — D631 Anemia in chronic kidney disease: Secondary | ICD-10-CM | POA: Diagnosis not present

## 2017-06-08 DIAGNOSIS — D689 Coagulation defect, unspecified: Secondary | ICD-10-CM | POA: Diagnosis not present

## 2017-06-08 DIAGNOSIS — N2581 Secondary hyperparathyroidism of renal origin: Secondary | ICD-10-CM | POA: Diagnosis not present

## 2017-06-08 DIAGNOSIS — D509 Iron deficiency anemia, unspecified: Secondary | ICD-10-CM | POA: Diagnosis not present

## 2017-06-08 DIAGNOSIS — N186 End stage renal disease: Secondary | ICD-10-CM | POA: Diagnosis not present

## 2017-06-10 DIAGNOSIS — T8249XD Other complication of vascular dialysis catheter, subsequent encounter: Secondary | ICD-10-CM | POA: Diagnosis not present

## 2017-06-10 DIAGNOSIS — D689 Coagulation defect, unspecified: Secondary | ICD-10-CM | POA: Diagnosis not present

## 2017-06-10 DIAGNOSIS — E1129 Type 2 diabetes mellitus with other diabetic kidney complication: Secondary | ICD-10-CM | POA: Diagnosis not present

## 2017-06-10 DIAGNOSIS — D631 Anemia in chronic kidney disease: Secondary | ICD-10-CM | POA: Diagnosis not present

## 2017-06-10 DIAGNOSIS — D509 Iron deficiency anemia, unspecified: Secondary | ICD-10-CM | POA: Diagnosis not present

## 2017-06-10 DIAGNOSIS — N186 End stage renal disease: Secondary | ICD-10-CM | POA: Diagnosis not present

## 2017-06-10 DIAGNOSIS — N2581 Secondary hyperparathyroidism of renal origin: Secondary | ICD-10-CM | POA: Diagnosis not present

## 2017-06-12 DIAGNOSIS — I2510 Atherosclerotic heart disease of native coronary artery without angina pectoris: Secondary | ICD-10-CM | POA: Diagnosis not present

## 2017-06-12 DIAGNOSIS — I5043 Acute on chronic combined systolic (congestive) and diastolic (congestive) heart failure: Secondary | ICD-10-CM | POA: Diagnosis not present

## 2017-06-12 DIAGNOSIS — Z794 Long term (current) use of insulin: Secondary | ICD-10-CM | POA: Diagnosis not present

## 2017-06-12 DIAGNOSIS — M8580 Other specified disorders of bone density and structure, unspecified site: Secondary | ICD-10-CM | POA: Diagnosis not present

## 2017-06-12 DIAGNOSIS — J9610 Chronic respiratory failure, unspecified whether with hypoxia or hypercapnia: Secondary | ICD-10-CM | POA: Diagnosis not present

## 2017-06-12 DIAGNOSIS — H409 Unspecified glaucoma: Secondary | ICD-10-CM | POA: Diagnosis not present

## 2017-06-12 DIAGNOSIS — E43 Unspecified severe protein-calorie malnutrition: Secondary | ICD-10-CM | POA: Diagnosis not present

## 2017-06-12 DIAGNOSIS — I132 Hypertensive heart and chronic kidney disease with heart failure and with stage 5 chronic kidney disease, or end stage renal disease: Secondary | ICD-10-CM | POA: Diagnosis not present

## 2017-06-12 DIAGNOSIS — D631 Anemia in chronic kidney disease: Secondary | ICD-10-CM | POA: Diagnosis not present

## 2017-06-12 DIAGNOSIS — M170 Bilateral primary osteoarthritis of knee: Secondary | ICD-10-CM | POA: Diagnosis not present

## 2017-06-12 DIAGNOSIS — N186 End stage renal disease: Secondary | ICD-10-CM | POA: Diagnosis not present

## 2017-06-12 DIAGNOSIS — E1122 Type 2 diabetes mellitus with diabetic chronic kidney disease: Secondary | ICD-10-CM | POA: Diagnosis not present

## 2017-06-12 DIAGNOSIS — S81801D Unspecified open wound, right lower leg, subsequent encounter: Secondary | ICD-10-CM | POA: Diagnosis not present

## 2017-06-13 DIAGNOSIS — D631 Anemia in chronic kidney disease: Secondary | ICD-10-CM | POA: Diagnosis not present

## 2017-06-13 DIAGNOSIS — N186 End stage renal disease: Secondary | ICD-10-CM | POA: Diagnosis not present

## 2017-06-13 DIAGNOSIS — T8249XD Other complication of vascular dialysis catheter, subsequent encounter: Secondary | ICD-10-CM | POA: Diagnosis not present

## 2017-06-13 DIAGNOSIS — N2581 Secondary hyperparathyroidism of renal origin: Secondary | ICD-10-CM | POA: Diagnosis not present

## 2017-06-13 DIAGNOSIS — D689 Coagulation defect, unspecified: Secondary | ICD-10-CM | POA: Diagnosis not present

## 2017-06-13 DIAGNOSIS — E1129 Type 2 diabetes mellitus with other diabetic kidney complication: Secondary | ICD-10-CM | POA: Diagnosis not present

## 2017-06-13 DIAGNOSIS — D509 Iron deficiency anemia, unspecified: Secondary | ICD-10-CM | POA: Diagnosis not present

## 2017-06-14 DIAGNOSIS — I2510 Atherosclerotic heart disease of native coronary artery without angina pectoris: Secondary | ICD-10-CM | POA: Diagnosis not present

## 2017-06-14 DIAGNOSIS — D631 Anemia in chronic kidney disease: Secondary | ICD-10-CM | POA: Diagnosis not present

## 2017-06-14 DIAGNOSIS — E43 Unspecified severe protein-calorie malnutrition: Secondary | ICD-10-CM | POA: Diagnosis not present

## 2017-06-14 DIAGNOSIS — I5043 Acute on chronic combined systolic (congestive) and diastolic (congestive) heart failure: Secondary | ICD-10-CM | POA: Diagnosis not present

## 2017-06-14 DIAGNOSIS — H409 Unspecified glaucoma: Secondary | ICD-10-CM | POA: Diagnosis not present

## 2017-06-14 DIAGNOSIS — M8580 Other specified disorders of bone density and structure, unspecified site: Secondary | ICD-10-CM | POA: Diagnosis not present

## 2017-06-14 DIAGNOSIS — N186 End stage renal disease: Secondary | ICD-10-CM | POA: Diagnosis not present

## 2017-06-14 DIAGNOSIS — I132 Hypertensive heart and chronic kidney disease with heart failure and with stage 5 chronic kidney disease, or end stage renal disease: Secondary | ICD-10-CM | POA: Diagnosis not present

## 2017-06-14 DIAGNOSIS — E1122 Type 2 diabetes mellitus with diabetic chronic kidney disease: Secondary | ICD-10-CM | POA: Diagnosis not present

## 2017-06-14 DIAGNOSIS — Z794 Long term (current) use of insulin: Secondary | ICD-10-CM | POA: Diagnosis not present

## 2017-06-14 DIAGNOSIS — S81801D Unspecified open wound, right lower leg, subsequent encounter: Secondary | ICD-10-CM | POA: Diagnosis not present

## 2017-06-14 DIAGNOSIS — J9610 Chronic respiratory failure, unspecified whether with hypoxia or hypercapnia: Secondary | ICD-10-CM | POA: Diagnosis not present

## 2017-06-14 DIAGNOSIS — M170 Bilateral primary osteoarthritis of knee: Secondary | ICD-10-CM | POA: Diagnosis not present

## 2017-06-15 DIAGNOSIS — D689 Coagulation defect, unspecified: Secondary | ICD-10-CM | POA: Diagnosis not present

## 2017-06-15 DIAGNOSIS — T8249XD Other complication of vascular dialysis catheter, subsequent encounter: Secondary | ICD-10-CM | POA: Diagnosis not present

## 2017-06-15 DIAGNOSIS — N2581 Secondary hyperparathyroidism of renal origin: Secondary | ICD-10-CM | POA: Diagnosis not present

## 2017-06-15 DIAGNOSIS — D509 Iron deficiency anemia, unspecified: Secondary | ICD-10-CM | POA: Diagnosis not present

## 2017-06-15 DIAGNOSIS — E1129 Type 2 diabetes mellitus with other diabetic kidney complication: Secondary | ICD-10-CM | POA: Diagnosis not present

## 2017-06-15 DIAGNOSIS — N186 End stage renal disease: Secondary | ICD-10-CM | POA: Diagnosis not present

## 2017-06-15 DIAGNOSIS — D631 Anemia in chronic kidney disease: Secondary | ICD-10-CM | POA: Diagnosis not present

## 2017-06-17 DIAGNOSIS — N2581 Secondary hyperparathyroidism of renal origin: Secondary | ICD-10-CM | POA: Diagnosis not present

## 2017-06-17 DIAGNOSIS — D689 Coagulation defect, unspecified: Secondary | ICD-10-CM | POA: Diagnosis not present

## 2017-06-17 DIAGNOSIS — D631 Anemia in chronic kidney disease: Secondary | ICD-10-CM | POA: Diagnosis not present

## 2017-06-17 DIAGNOSIS — N186 End stage renal disease: Secondary | ICD-10-CM | POA: Diagnosis not present

## 2017-06-17 DIAGNOSIS — T8249XD Other complication of vascular dialysis catheter, subsequent encounter: Secondary | ICD-10-CM | POA: Diagnosis not present

## 2017-06-17 DIAGNOSIS — D509 Iron deficiency anemia, unspecified: Secondary | ICD-10-CM | POA: Diagnosis not present

## 2017-06-17 DIAGNOSIS — E1129 Type 2 diabetes mellitus with other diabetic kidney complication: Secondary | ICD-10-CM | POA: Diagnosis not present

## 2017-06-20 DIAGNOSIS — N186 End stage renal disease: Secondary | ICD-10-CM | POA: Diagnosis not present

## 2017-06-20 DIAGNOSIS — D509 Iron deficiency anemia, unspecified: Secondary | ICD-10-CM | POA: Diagnosis not present

## 2017-06-20 DIAGNOSIS — T8249XD Other complication of vascular dialysis catheter, subsequent encounter: Secondary | ICD-10-CM | POA: Diagnosis not present

## 2017-06-20 DIAGNOSIS — N2581 Secondary hyperparathyroidism of renal origin: Secondary | ICD-10-CM | POA: Diagnosis not present

## 2017-06-20 DIAGNOSIS — E1129 Type 2 diabetes mellitus with other diabetic kidney complication: Secondary | ICD-10-CM | POA: Diagnosis not present

## 2017-06-20 DIAGNOSIS — D631 Anemia in chronic kidney disease: Secondary | ICD-10-CM | POA: Diagnosis not present

## 2017-06-20 DIAGNOSIS — D689 Coagulation defect, unspecified: Secondary | ICD-10-CM | POA: Diagnosis not present

## 2017-06-21 DIAGNOSIS — J9610 Chronic respiratory failure, unspecified whether with hypoxia or hypercapnia: Secondary | ICD-10-CM | POA: Diagnosis not present

## 2017-06-21 DIAGNOSIS — I5043 Acute on chronic combined systolic (congestive) and diastolic (congestive) heart failure: Secondary | ICD-10-CM | POA: Diagnosis not present

## 2017-06-21 DIAGNOSIS — Z794 Long term (current) use of insulin: Secondary | ICD-10-CM | POA: Diagnosis not present

## 2017-06-21 DIAGNOSIS — E1122 Type 2 diabetes mellitus with diabetic chronic kidney disease: Secondary | ICD-10-CM | POA: Diagnosis not present

## 2017-06-21 DIAGNOSIS — D631 Anemia in chronic kidney disease: Secondary | ICD-10-CM | POA: Diagnosis not present

## 2017-06-21 DIAGNOSIS — S81801D Unspecified open wound, right lower leg, subsequent encounter: Secondary | ICD-10-CM | POA: Diagnosis not present

## 2017-06-21 DIAGNOSIS — E43 Unspecified severe protein-calorie malnutrition: Secondary | ICD-10-CM | POA: Diagnosis not present

## 2017-06-21 DIAGNOSIS — H409 Unspecified glaucoma: Secondary | ICD-10-CM | POA: Diagnosis not present

## 2017-06-21 DIAGNOSIS — M170 Bilateral primary osteoarthritis of knee: Secondary | ICD-10-CM | POA: Diagnosis not present

## 2017-06-21 DIAGNOSIS — I2510 Atherosclerotic heart disease of native coronary artery without angina pectoris: Secondary | ICD-10-CM | POA: Diagnosis not present

## 2017-06-21 DIAGNOSIS — N186 End stage renal disease: Secondary | ICD-10-CM | POA: Diagnosis not present

## 2017-06-21 DIAGNOSIS — I132 Hypertensive heart and chronic kidney disease with heart failure and with stage 5 chronic kidney disease, or end stage renal disease: Secondary | ICD-10-CM | POA: Diagnosis not present

## 2017-06-21 DIAGNOSIS — M8580 Other specified disorders of bone density and structure, unspecified site: Secondary | ICD-10-CM | POA: Diagnosis not present

## 2017-06-22 DIAGNOSIS — T8249XD Other complication of vascular dialysis catheter, subsequent encounter: Secondary | ICD-10-CM | POA: Diagnosis not present

## 2017-06-22 DIAGNOSIS — E1129 Type 2 diabetes mellitus with other diabetic kidney complication: Secondary | ICD-10-CM | POA: Diagnosis not present

## 2017-06-22 DIAGNOSIS — D689 Coagulation defect, unspecified: Secondary | ICD-10-CM | POA: Diagnosis not present

## 2017-06-22 DIAGNOSIS — N2581 Secondary hyperparathyroidism of renal origin: Secondary | ICD-10-CM | POA: Diagnosis not present

## 2017-06-22 DIAGNOSIS — N186 End stage renal disease: Secondary | ICD-10-CM | POA: Diagnosis not present

## 2017-06-22 DIAGNOSIS — D631 Anemia in chronic kidney disease: Secondary | ICD-10-CM | POA: Diagnosis not present

## 2017-06-22 DIAGNOSIS — D509 Iron deficiency anemia, unspecified: Secondary | ICD-10-CM | POA: Diagnosis not present

## 2017-06-24 DIAGNOSIS — T8249XD Other complication of vascular dialysis catheter, subsequent encounter: Secondary | ICD-10-CM | POA: Diagnosis not present

## 2017-06-24 DIAGNOSIS — D689 Coagulation defect, unspecified: Secondary | ICD-10-CM | POA: Diagnosis not present

## 2017-06-24 DIAGNOSIS — N2581 Secondary hyperparathyroidism of renal origin: Secondary | ICD-10-CM | POA: Diagnosis not present

## 2017-06-24 DIAGNOSIS — D631 Anemia in chronic kidney disease: Secondary | ICD-10-CM | POA: Diagnosis not present

## 2017-06-24 DIAGNOSIS — E1129 Type 2 diabetes mellitus with other diabetic kidney complication: Secondary | ICD-10-CM | POA: Diagnosis not present

## 2017-06-24 DIAGNOSIS — N186 End stage renal disease: Secondary | ICD-10-CM | POA: Diagnosis not present

## 2017-06-24 DIAGNOSIS — D509 Iron deficiency anemia, unspecified: Secondary | ICD-10-CM | POA: Diagnosis not present

## 2017-06-26 DIAGNOSIS — T8249XD Other complication of vascular dialysis catheter, subsequent encounter: Secondary | ICD-10-CM | POA: Diagnosis not present

## 2017-06-26 DIAGNOSIS — D689 Coagulation defect, unspecified: Secondary | ICD-10-CM | POA: Diagnosis not present

## 2017-06-26 DIAGNOSIS — N2581 Secondary hyperparathyroidism of renal origin: Secondary | ICD-10-CM | POA: Diagnosis not present

## 2017-06-26 DIAGNOSIS — E1129 Type 2 diabetes mellitus with other diabetic kidney complication: Secondary | ICD-10-CM | POA: Diagnosis not present

## 2017-06-26 DIAGNOSIS — D631 Anemia in chronic kidney disease: Secondary | ICD-10-CM | POA: Diagnosis not present

## 2017-06-26 DIAGNOSIS — D509 Iron deficiency anemia, unspecified: Secondary | ICD-10-CM | POA: Diagnosis not present

## 2017-06-26 DIAGNOSIS — N186 End stage renal disease: Secondary | ICD-10-CM | POA: Diagnosis not present

## 2017-06-28 DIAGNOSIS — T8249XD Other complication of vascular dialysis catheter, subsequent encounter: Secondary | ICD-10-CM | POA: Diagnosis not present

## 2017-06-28 DIAGNOSIS — D689 Coagulation defect, unspecified: Secondary | ICD-10-CM | POA: Diagnosis not present

## 2017-06-28 DIAGNOSIS — D509 Iron deficiency anemia, unspecified: Secondary | ICD-10-CM | POA: Diagnosis not present

## 2017-06-28 DIAGNOSIS — D631 Anemia in chronic kidney disease: Secondary | ICD-10-CM | POA: Diagnosis not present

## 2017-06-28 DIAGNOSIS — N2581 Secondary hyperparathyroidism of renal origin: Secondary | ICD-10-CM | POA: Diagnosis not present

## 2017-06-28 DIAGNOSIS — E1129 Type 2 diabetes mellitus with other diabetic kidney complication: Secondary | ICD-10-CM | POA: Diagnosis not present

## 2017-06-28 DIAGNOSIS — N186 End stage renal disease: Secondary | ICD-10-CM | POA: Diagnosis not present

## 2017-07-01 DIAGNOSIS — N2581 Secondary hyperparathyroidism of renal origin: Secondary | ICD-10-CM | POA: Diagnosis not present

## 2017-07-01 DIAGNOSIS — D631 Anemia in chronic kidney disease: Secondary | ICD-10-CM | POA: Diagnosis not present

## 2017-07-01 DIAGNOSIS — N186 End stage renal disease: Secondary | ICD-10-CM | POA: Diagnosis not present

## 2017-07-01 DIAGNOSIS — D509 Iron deficiency anemia, unspecified: Secondary | ICD-10-CM | POA: Diagnosis not present

## 2017-07-01 DIAGNOSIS — E1129 Type 2 diabetes mellitus with other diabetic kidney complication: Secondary | ICD-10-CM | POA: Diagnosis not present

## 2017-07-01 DIAGNOSIS — T8249XD Other complication of vascular dialysis catheter, subsequent encounter: Secondary | ICD-10-CM | POA: Diagnosis not present

## 2017-07-01 DIAGNOSIS — D689 Coagulation defect, unspecified: Secondary | ICD-10-CM | POA: Diagnosis not present

## 2017-07-03 DIAGNOSIS — I2510 Atherosclerotic heart disease of native coronary artery without angina pectoris: Secondary | ICD-10-CM | POA: Diagnosis not present

## 2017-07-03 DIAGNOSIS — E1122 Type 2 diabetes mellitus with diabetic chronic kidney disease: Secondary | ICD-10-CM | POA: Diagnosis not present

## 2017-07-03 DIAGNOSIS — M170 Bilateral primary osteoarthritis of knee: Secondary | ICD-10-CM | POA: Diagnosis not present

## 2017-07-03 DIAGNOSIS — D631 Anemia in chronic kidney disease: Secondary | ICD-10-CM | POA: Diagnosis not present

## 2017-07-03 DIAGNOSIS — Z794 Long term (current) use of insulin: Secondary | ICD-10-CM | POA: Diagnosis not present

## 2017-07-03 DIAGNOSIS — I5043 Acute on chronic combined systolic (congestive) and diastolic (congestive) heart failure: Secondary | ICD-10-CM | POA: Diagnosis not present

## 2017-07-03 DIAGNOSIS — N186 End stage renal disease: Secondary | ICD-10-CM | POA: Diagnosis not present

## 2017-07-03 DIAGNOSIS — J9610 Chronic respiratory failure, unspecified whether with hypoxia or hypercapnia: Secondary | ICD-10-CM | POA: Diagnosis not present

## 2017-07-03 DIAGNOSIS — M8580 Other specified disorders of bone density and structure, unspecified site: Secondary | ICD-10-CM | POA: Diagnosis not present

## 2017-07-03 DIAGNOSIS — E43 Unspecified severe protein-calorie malnutrition: Secondary | ICD-10-CM | POA: Diagnosis not present

## 2017-07-03 DIAGNOSIS — S81801D Unspecified open wound, right lower leg, subsequent encounter: Secondary | ICD-10-CM | POA: Diagnosis not present

## 2017-07-03 DIAGNOSIS — I132 Hypertensive heart and chronic kidney disease with heart failure and with stage 5 chronic kidney disease, or end stage renal disease: Secondary | ICD-10-CM | POA: Diagnosis not present

## 2017-07-03 DIAGNOSIS — H409 Unspecified glaucoma: Secondary | ICD-10-CM | POA: Diagnosis not present

## 2017-07-04 DIAGNOSIS — E1129 Type 2 diabetes mellitus with other diabetic kidney complication: Secondary | ICD-10-CM | POA: Diagnosis not present

## 2017-07-04 DIAGNOSIS — D509 Iron deficiency anemia, unspecified: Secondary | ICD-10-CM | POA: Diagnosis not present

## 2017-07-04 DIAGNOSIS — D689 Coagulation defect, unspecified: Secondary | ICD-10-CM | POA: Diagnosis not present

## 2017-07-04 DIAGNOSIS — N2581 Secondary hyperparathyroidism of renal origin: Secondary | ICD-10-CM | POA: Diagnosis not present

## 2017-07-04 DIAGNOSIS — T8249XD Other complication of vascular dialysis catheter, subsequent encounter: Secondary | ICD-10-CM | POA: Diagnosis not present

## 2017-07-04 DIAGNOSIS — N186 End stage renal disease: Secondary | ICD-10-CM | POA: Diagnosis not present

## 2017-07-04 DIAGNOSIS — D631 Anemia in chronic kidney disease: Secondary | ICD-10-CM | POA: Diagnosis not present

## 2017-07-06 DIAGNOSIS — D631 Anemia in chronic kidney disease: Secondary | ICD-10-CM | POA: Diagnosis not present

## 2017-07-06 DIAGNOSIS — E1129 Type 2 diabetes mellitus with other diabetic kidney complication: Secondary | ICD-10-CM | POA: Diagnosis not present

## 2017-07-06 DIAGNOSIS — N2581 Secondary hyperparathyroidism of renal origin: Secondary | ICD-10-CM | POA: Diagnosis not present

## 2017-07-06 DIAGNOSIS — D689 Coagulation defect, unspecified: Secondary | ICD-10-CM | POA: Diagnosis not present

## 2017-07-06 DIAGNOSIS — N186 End stage renal disease: Secondary | ICD-10-CM | POA: Diagnosis not present

## 2017-07-06 DIAGNOSIS — T8249XD Other complication of vascular dialysis catheter, subsequent encounter: Secondary | ICD-10-CM | POA: Diagnosis not present

## 2017-07-06 DIAGNOSIS — D509 Iron deficiency anemia, unspecified: Secondary | ICD-10-CM | POA: Diagnosis not present

## 2017-07-07 DIAGNOSIS — E1122 Type 2 diabetes mellitus with diabetic chronic kidney disease: Secondary | ICD-10-CM | POA: Diagnosis not present

## 2017-07-07 DIAGNOSIS — D631 Anemia in chronic kidney disease: Secondary | ICD-10-CM | POA: Diagnosis not present

## 2017-07-07 DIAGNOSIS — Z794 Long term (current) use of insulin: Secondary | ICD-10-CM | POA: Diagnosis not present

## 2017-07-07 DIAGNOSIS — N186 End stage renal disease: Secondary | ICD-10-CM | POA: Diagnosis not present

## 2017-07-07 DIAGNOSIS — I5043 Acute on chronic combined systolic (congestive) and diastolic (congestive) heart failure: Secondary | ICD-10-CM | POA: Diagnosis not present

## 2017-07-07 DIAGNOSIS — I132 Hypertensive heart and chronic kidney disease with heart failure and with stage 5 chronic kidney disease, or end stage renal disease: Secondary | ICD-10-CM | POA: Diagnosis not present

## 2017-07-07 DIAGNOSIS — I2510 Atherosclerotic heart disease of native coronary artery without angina pectoris: Secondary | ICD-10-CM | POA: Diagnosis not present

## 2017-07-07 DIAGNOSIS — E43 Unspecified severe protein-calorie malnutrition: Secondary | ICD-10-CM | POA: Diagnosis not present

## 2017-07-07 DIAGNOSIS — H409 Unspecified glaucoma: Secondary | ICD-10-CM | POA: Diagnosis not present

## 2017-07-07 DIAGNOSIS — M170 Bilateral primary osteoarthritis of knee: Secondary | ICD-10-CM | POA: Diagnosis not present

## 2017-07-07 DIAGNOSIS — Z992 Dependence on renal dialysis: Secondary | ICD-10-CM | POA: Diagnosis not present

## 2017-07-07 DIAGNOSIS — S81801D Unspecified open wound, right lower leg, subsequent encounter: Secondary | ICD-10-CM | POA: Diagnosis not present

## 2017-07-07 DIAGNOSIS — J9610 Chronic respiratory failure, unspecified whether with hypoxia or hypercapnia: Secondary | ICD-10-CM | POA: Diagnosis not present

## 2017-07-07 DIAGNOSIS — M8580 Other specified disorders of bone density and structure, unspecified site: Secondary | ICD-10-CM | POA: Diagnosis not present

## 2017-07-08 DIAGNOSIS — D689 Coagulation defect, unspecified: Secondary | ICD-10-CM | POA: Diagnosis not present

## 2017-07-08 DIAGNOSIS — E1129 Type 2 diabetes mellitus with other diabetic kidney complication: Secondary | ICD-10-CM | POA: Diagnosis not present

## 2017-07-08 DIAGNOSIS — N2581 Secondary hyperparathyroidism of renal origin: Secondary | ICD-10-CM | POA: Diagnosis not present

## 2017-07-08 DIAGNOSIS — N186 End stage renal disease: Secondary | ICD-10-CM | POA: Diagnosis not present

## 2017-07-08 DIAGNOSIS — D509 Iron deficiency anemia, unspecified: Secondary | ICD-10-CM | POA: Diagnosis not present

## 2017-07-08 DIAGNOSIS — T8249XD Other complication of vascular dialysis catheter, subsequent encounter: Secondary | ICD-10-CM | POA: Diagnosis not present

## 2017-07-08 DIAGNOSIS — D631 Anemia in chronic kidney disease: Secondary | ICD-10-CM | POA: Diagnosis not present

## 2017-07-10 DIAGNOSIS — E119 Type 2 diabetes mellitus without complications: Secondary | ICD-10-CM | POA: Diagnosis not present

## 2017-07-10 DIAGNOSIS — I10 Essential (primary) hypertension: Secondary | ICD-10-CM | POA: Diagnosis not present

## 2017-07-10 DIAGNOSIS — R0989 Other specified symptoms and signs involving the circulatory and respiratory systems: Secondary | ICD-10-CM | POA: Diagnosis not present

## 2017-07-10 DIAGNOSIS — G44209 Tension-type headache, unspecified, not intractable: Secondary | ICD-10-CM | POA: Diagnosis not present

## 2017-07-11 ENCOUNTER — Other Ambulatory Visit: Payer: Self-pay | Admitting: Internal Medicine

## 2017-07-11 DIAGNOSIS — N186 End stage renal disease: Secondary | ICD-10-CM | POA: Diagnosis not present

## 2017-07-11 DIAGNOSIS — D509 Iron deficiency anemia, unspecified: Secondary | ICD-10-CM | POA: Diagnosis not present

## 2017-07-11 DIAGNOSIS — R0989 Other specified symptoms and signs involving the circulatory and respiratory systems: Secondary | ICD-10-CM

## 2017-07-11 DIAGNOSIS — T8249XD Other complication of vascular dialysis catheter, subsequent encounter: Secondary | ICD-10-CM | POA: Diagnosis not present

## 2017-07-11 DIAGNOSIS — D631 Anemia in chronic kidney disease: Secondary | ICD-10-CM | POA: Diagnosis not present

## 2017-07-11 DIAGNOSIS — N2581 Secondary hyperparathyroidism of renal origin: Secondary | ICD-10-CM | POA: Diagnosis not present

## 2017-07-11 DIAGNOSIS — D689 Coagulation defect, unspecified: Secondary | ICD-10-CM | POA: Diagnosis not present

## 2017-07-11 DIAGNOSIS — E1129 Type 2 diabetes mellitus with other diabetic kidney complication: Secondary | ICD-10-CM | POA: Diagnosis not present

## 2017-07-12 ENCOUNTER — Ambulatory Visit: Payer: Medicare Other | Admitting: Vascular Surgery

## 2017-07-13 ENCOUNTER — Emergency Department (HOSPITAL_COMMUNITY): Payer: Medicare Other

## 2017-07-13 ENCOUNTER — Encounter (HOSPITAL_COMMUNITY): Payer: Self-pay

## 2017-07-13 ENCOUNTER — Inpatient Hospital Stay (HOSPITAL_COMMUNITY)
Admission: EM | Admit: 2017-07-13 | Discharge: 2017-07-19 | DRG: 091 | Disposition: A | Discharge status: Home Health | Payer: Medicare Other | Attending: Internal Medicine | Admitting: Internal Medicine

## 2017-07-13 DIAGNOSIS — R413 Other amnesia: Secondary | ICD-10-CM | POA: Diagnosis not present

## 2017-07-13 DIAGNOSIS — Z794 Long term (current) use of insulin: Secondary | ICD-10-CM | POA: Diagnosis not present

## 2017-07-13 DIAGNOSIS — I2510 Atherosclerotic heart disease of native coronary artery without angina pectoris: Secondary | ICD-10-CM | POA: Diagnosis not present

## 2017-07-13 DIAGNOSIS — J9811 Atelectasis: Secondary | ICD-10-CM | POA: Diagnosis present

## 2017-07-13 DIAGNOSIS — E1151 Type 2 diabetes mellitus with diabetic peripheral angiopathy without gangrene: Secondary | ICD-10-CM | POA: Diagnosis present

## 2017-07-13 DIAGNOSIS — D631 Anemia in chronic kidney disease: Secondary | ICD-10-CM | POA: Diagnosis present

## 2017-07-13 DIAGNOSIS — Z6831 Body mass index (BMI) 31.0-31.9, adult: Secondary | ICD-10-CM

## 2017-07-13 DIAGNOSIS — N2581 Secondary hyperparathyroidism of renal origin: Secondary | ICD-10-CM | POA: Diagnosis not present

## 2017-07-13 DIAGNOSIS — I651 Occlusion and stenosis of basilar artery: Secondary | ICD-10-CM | POA: Diagnosis present

## 2017-07-13 DIAGNOSIS — Z79899 Other long term (current) drug therapy: Secondary | ICD-10-CM | POA: Diagnosis not present

## 2017-07-13 DIAGNOSIS — Z7982 Long term (current) use of aspirin: Secondary | ICD-10-CM

## 2017-07-13 DIAGNOSIS — E785 Hyperlipidemia, unspecified: Secondary | ICD-10-CM | POA: Diagnosis present

## 2017-07-13 DIAGNOSIS — N186 End stage renal disease: Secondary | ICD-10-CM

## 2017-07-13 DIAGNOSIS — Z87891 Personal history of nicotine dependence: Secondary | ICD-10-CM | POA: Diagnosis not present

## 2017-07-13 DIAGNOSIS — I5042 Chronic combined systolic (congestive) and diastolic (congestive) heart failure: Secondary | ICD-10-CM | POA: Diagnosis present

## 2017-07-13 DIAGNOSIS — I739 Peripheral vascular disease, unspecified: Secondary | ICD-10-CM | POA: Diagnosis not present

## 2017-07-13 DIAGNOSIS — I10 Essential (primary) hypertension: Secondary | ICD-10-CM | POA: Diagnosis not present

## 2017-07-13 DIAGNOSIS — E1122 Type 2 diabetes mellitus with diabetic chronic kidney disease: Secondary | ICD-10-CM | POA: Diagnosis not present

## 2017-07-13 DIAGNOSIS — I6502 Occlusion and stenosis of left vertebral artery: Secondary | ICD-10-CM | POA: Diagnosis not present

## 2017-07-13 DIAGNOSIS — I132 Hypertensive heart and chronic kidney disease with heart failure and with stage 5 chronic kidney disease, or end stage renal disease: Secondary | ICD-10-CM | POA: Diagnosis not present

## 2017-07-13 DIAGNOSIS — Z992 Dependence on renal dialysis: Secondary | ICD-10-CM

## 2017-07-13 DIAGNOSIS — G92 Toxic encephalopathy: Principal | ICD-10-CM | POA: Diagnosis present

## 2017-07-13 DIAGNOSIS — R109 Unspecified abdominal pain: Secondary | ICD-10-CM | POA: Diagnosis not present

## 2017-07-13 DIAGNOSIS — E669 Obesity, unspecified: Secondary | ICD-10-CM | POA: Diagnosis present

## 2017-07-13 DIAGNOSIS — J90 Pleural effusion, not elsewhere classified: Secondary | ICD-10-CM | POA: Diagnosis not present

## 2017-07-13 DIAGNOSIS — T428X5A Adverse effect of antiparkinsonism drugs and other central muscle-tone depressants, initial encounter: Secondary | ICD-10-CM | POA: Diagnosis present

## 2017-07-13 DIAGNOSIS — E139 Other specified diabetes mellitus without complications: Secondary | ICD-10-CM

## 2017-07-13 DIAGNOSIS — R4182 Altered mental status, unspecified: Secondary | ICD-10-CM | POA: Diagnosis not present

## 2017-07-13 DIAGNOSIS — I120 Hypertensive chronic kidney disease with stage 5 chronic kidney disease or end stage renal disease: Secondary | ICD-10-CM | POA: Diagnosis not present

## 2017-07-13 DIAGNOSIS — J810 Acute pulmonary edema: Secondary | ICD-10-CM | POA: Diagnosis not present

## 2017-07-13 NOTE — ED Triage Notes (Signed)
Pt comes via [facility] for hypertension, confused LSN 1500, neuro intact,  missed dialysis today due to not feeling well. BP over 835 systolic with EMS.

## 2017-07-13 NOTE — ED Provider Notes (Signed)
[facility] EMERGENCY DEPARTMENT Provider Note   CSN: [id number] Arrival date & time: 07/13/17  2311     History   Chief Complaint Chief Complaint  Patient presents with  . Hypertension    HPI Kathleen [name] is a 71 y.o. female.  The history is provided by the EMS personnel. The history is limited by the condition of the patient (She will not answer questions).  Hypertension   She is reported to have been confused, last seen normal at about 3 PM.  She was supposed to have had dialysis today, but missed it because she was not feeling well.  Patient is unable to verbalize anything to me other than her name.  When asked why she is here, she just shakes her head.  She denies pain or difficulty breathing.  EMS reports severe hypertension in route.  She has known history of coronary artery disease, end-stage renal disease on dialysis, diabetes, hypertension, combined systolic and diastolic heart failure.  Past Medical History:  Diagnosis Date  . Anemia    a. 01/2014: suspected of chronic disease, negative FOBT.  [name] CAD (coronary artery disease)    a. Cath 01/2014: mild in LAD, mild-mod LCx, mod-severe RCA - for med rx.  . CKD (chronic kidney disease), stage III ([place])    a. Borderline III-IV.  . Diabetes mellitus without complication ([place])   . History of echocardiogram    Echo (10/15):  Mod LVH, EF 50-55%, Gr 1 DD, MAC, mild MR, mild TR, PASP 35 mmHg  . Hyperlipidemia   . Hypertension   . Meningioma ([place])    a. Incidental dx 01/2014.  . Obesity   . Systolic CHF ([place])    a. 11/2593: dx with mixed ICM/NICM (out of proportion to CAD) EF 25-30% by echo.     Patient Active Problem List   Diagnosis Date Noted  . Knee pain   . Acute lower UTI   . ESRD (end stage renal disease) ([place]) 02/15/2017  . Peripheral arterial disease ([place]) 02/23/2016  . Insulin dependent diabetes mellitus ([place]) 12/18/2015  . Anemia in chronic kidney disease 12/18/2015  . Acute renal failure  superimposed on stage 4 chronic kidney disease ([place]) 08/13/2015  . Dyspnea   . Goals of care, counseling/discussion   . SOB (shortness of breath)   . Encounter for palliative care   . Pulmonary vascular congestion   . Pleural effusion, left   . Acute on chronic renal failure ([place])   . HLD (hyperlipidemia)   . Noncompliance with medication regimen   . Disorientation   . Somnolence   . Acute respiratory failure with hypercapnia ([place])   . Respiratory acidosis   . Hypernatremia   . Systolic and diastolic CHF, acute ([place])   . CHF, acute on chronic ([place]) 08/05/2015  . Acute renal failure superimposed on stage 3 chronic kidney disease ([place]) 08/05/2015  . Elevated troponin 08/05/2015  . CHF (congestive heart failure) ([place]) 08/05/2015  . Acute on chronic heart failure ([place]) 08/05/2015  . Acute on chronic combined systolic and diastolic congestive heart failure ([place])   . AKI (acute kidney injury) ([place])   . Left carotid bruit 11/20/2014  . Syncope and collapse 06/10/2014  . Syncope 06/10/2014  . Essential hypertension   . Unspecified vitamin D deficiency   . Cellulitis and abscess of right lower extremitiy 02/28/2014  . Chronic combined systolic and diastolic heart failure ([place]) 02/12/2014  . CAD (coronary artery disease)   . Obesity   .  Meningioma ([place])   . Anemia   . CKD stage 4 due to type 2 diabetes mellitus ([place]) 01/24/2014  . Hyperlipidemia 01/22/2014  . BPPV (benign paroxysmal positional vertigo) 01/22/2014  . Hypertensive urgency 01/21/2014  . Tobacco abuse 01/21/2014  . Diabetes mellitus due to abnormal insulin ([employer]) 01/21/2014    Past Surgical History:  Procedure Laterality Date  . ABDOMINAL HYSTERECTOMY    . AV FISTULA PLACEMENT Left 02/10/2017   Procedure: ARTERIOVENOUS (AV) FISTULA CREATION-LEFT FOREARM;  Surgeon: Elam Dutch, MD;  Location: [facility];  Service: Vascular;  Laterality: Left;  . BREAST SURGERY    . INSERTION OF DIALYSIS CATHETER Right 02/10/2017    Procedure: INSERTION OF DIALYSIS CATHETER;  Surgeon: Elam Dutch, MD;  Location: [facility];  Service: Vascular;  Laterality: Right;  . LEFT HEART CATHETERIZATION WITH CORONARY ANGIOGRAM N/A 01/27/2014   Procedure: LEFT HEART CATHETERIZATION WITH CORONARY ANGIOGRAM;  Surgeon: Jettie Booze, MD;  Location: [facility] C[facility] LAB;  Service: Cardiovascular;  Laterality: N/A;  . TONSILLECTOMY      OB History    No data available       Home Medications    Prior to Admission medications   Medication Sig Start Date End Date Taking? Authorizing Provider  aspirin 81 MG EC tablet Take 1 tablet (81 mg total) by mouth daily. 12/25/15   Cherene Altes, MD  brimonidine-timolol (COMBIGAN) 0.2-0.5 % ophthalmic solution Place 1 drop into the right eye 2 (two) times daily as needed (for glaucoma).     [provider]  calcitRIOL (ROCALTROL) 0.5 MCG capsule Take 1 capsule (0.5 mcg total) by mouth Every Tuesday,Thursday,and Saturday with dialysis. 02/21/17   Mikhail, Velta Addison, DO  calcium acetate (PHOSLO) 667 MG capsule Take 667 mg by mouth 3 (three) times daily with meals.    [provider]  carvedilol (COREG) 25 MG tablet Take 25 mg by mouth 2 (two) times daily. 03/28/17   [provider]  furosemide (LASIX) 40 MG tablet Take 120 mg by mouth daily. 04/11/17   [provider]  hydrALAZINE (APRESOLINE) 25 MG tablet Take 1 tablet (25 mg total) by mouth every 8 (eight) hours as needed (SBP > 170  or DBP > 110). 02/20/17   Mikhail, [place], DO  insulin aspart (NOVOLOG) 100 UNIT/ML injection Inject 6 Units into the skin 3 (three) times daily with meals. CBG < 70: implement hypoglycemia protocol CBG 70 - 120: 0 units CBG 121 - 150: 1 unit CBG 151 - 200: 2 units CBG 201 - 250: 3 units CBG 251 - 300: 5 units CBG 301 - 350: 7 units CBG 351 - 400: 9 units CBG > 400: call MD. 12/22/15   Cherene Altes, MD  insulin detemir (LEVEMIR) 100 UNIT/ML injection Inject 0.15 mLs (15 Units  total) into the skin at bedtime. 02/20/17   Mikhail, Velta Addison, DO  isosorbide mononitrate (IMDUR) 30 MG 24 hr tablet Take 1 tablet (30 mg total) by mouth daily. 06/02/16   Lorretta Harp, MD  meclizine (ANTIVERT) 12.5 MG tablet Take 1 tablet (12.5 mg total) by mouth 3 (three) times daily as needed for dizziness or nausea. For dizzy spells 12/22/15   Cherene Altes, MD  multivitamin (RENA-VIT) TABS tablet Take 1 tablet by mouth at bedtime. 02/20/17   Cristal Ford, DO  oxyCODONE-acetaminophen (PERCOCET/ROXICET) 5-325 MG tablet Take 1 tablet by mouth every 4 (four) hours as needed for moderate pain. 02/20/17   Mikhail, Velta Addison, DO  polyethylene glycol [facility] /  GLYCOLAX) packet Take 17 g by mouth daily. 02/20/17   Cristal Ford, DO    Family History Family History  Problem Relation Age of Onset  . Diabetic kidney disease Mother   . Hypertension Mother   . Heart failure Mother   . Heart attack Mother   . Hyperlipidemia Mother   . Hypertension Father     Social History Social History   Tobacco Use  . Smoking status: Former Smoker    Packs/day: 0.25    Years: 30.00    Pack years: 7.50    Types: Cigarettes  . Smokeless tobacco: Never Used  Substance Use Topics  . Alcohol use: No  . Drug use: No     Allergies   Patient has no known allergies.   Review of Systems Review of Systems  Unable to perform ROS: Patient nonverbal     Physical Exam Updated Vital Signs BP (!) 190/67 (BP Location: Right Arm)   Pulse 80   Temp 99 F (37.2 C) (Oral)   Resp 20   SpO2 95%   Physical Exam  Nursing note and vitals reviewed.  71 year old female, resting comfortably and in no acute distress. Vital signs are significant for hypertension. Oxygen saturation is 95%, which is normal. Head is normocephalic and atraumatic. PERRLA, EOMI. Oropharynx is clear. Neck is nontender and supple without adenopathy or JVD. Back is nontender and there is no CVA tenderness. Lungs are clear  without rales, wheezes, or rhonchi. Chest is nontender.  Dialysis access catheters are present in the right side. Heart has regular rate and rhythm with 2/6 systolic ejection murmur heard at the upper right sternal border with radiation to the neck. Abdomen is soft, flat, nontender without masses or hepatosplenomegaly and peristalsis is normoactive. Extremities have no cyanosis or edema, full range of motion is present.  AV fistula present left upper arm with thrill present. Skin is warm and dry without rash. Neurologic: She is awake and will follow commands, will state her name, but is otherwise nonverbal, cranial nerves are intact, there are no motor or sensory deficits.  ED Treatments / Results  Labs (all labs ordered are listed, but only abnormal results are displayed) Labs Reviewed  URINALYSIS, ROUTINE W REFLEX MICROSCOPIC - Abnormal; Notable for the following components:      Result Value   Protein, ur 100 (*)    Squamous Epithelial / LPF 0-5 (*)    All other components within normal limits  CBC WITH DIFFERENTIAL/PLATELET - Abnormal; Notable for the following components:   RDW 16.2 (*)    All other components within normal limits  COMPREHENSIVE METABOLIC PANEL - Abnormal; Notable for the following components:   Chloride 95 (*)    Glucose, Bld 121 (*)    BUN 46 (*)    Creatinine, Ser 5.82 (*)    Calcium 7.8 (*)    ALT 10 (*)    Alkaline Phosphatase 146 (*)    GFR calc non Af Amer 7 (*)    GFR calc Af Amer 8 (*)    Anion gap 16 (*)    All other components within normal limits  CBG MONITORING, ED - Abnormal; Notable for the following components:   Glucose-Capillary 118 (*)    All other components within normal limits    EKG  EKG Interpretation  Date/Time:  Friday July 14 2017 00:05:51 EST Ventricular Rate:  88 PR Interval:    QRS Duration: 93 QT Interval:  443 QTC Calculation: 536 R Axis:  43 Text Interpretation:  Sinus rhythm Probable left atrial enlargement  Probable anteroseptal infarct, old Nonspecific T abnormalities, lateral leads Prolonged QT interval When compared with ECG of 02/06/2017, QT has lengthened Nonspecific T wave abnormality less evident Confirmed by Delora Fuel ([id number]) on 07/14/2017 12:12:16 AM       Radiology Dg Chest 2 View  Result Date: 07/14/2017 CLINICAL DATA:  71 y/o  F; altered mental status. EXAM: CHEST  2 VIEW COMPARISON:  02/10/2017 chest radiograph. FINDINGS: Stable cardiomegaly. Stable right central venous catheter with tip projecting over right atrium. Interstitial pulmonary edema. Small left pleural effusion. Left basilar opacity probably represents associated atelectasis. No acute osseous abnormality is evident. IMPRESSION: Stable cardiomegaly. Interstitial edema. Small left pleural effusion. Left basilar opacity is probably associated atelectasis. Electronically Signed   By: Kristine Garbe M.D.   On: 07/14/2017 01:35   Ct Head Wo Contrast  Result Date: 07/14/2017 CLINICAL DATA:  Extreme hypertension.  Confusion. EXAM: CT HEAD WITHOUT CONTRAST TECHNIQUE: Contiguous axial images were obtained from the base of the skull through the vertex without intravenous contrast. COMPARISON:  MRI of the brain 06/13/2014, head CT 06/10/2014 FINDINGS: Brain: No evidence of acute infarction, hemorrhage, hydrocephalus, extra-axial collection or mass lesion/mass effect. Moderate brain parenchymal volume loss and periventricular microangiopathy. Vascular: Calcific atherosclerotic disease at the skullbase. Skull: Normal. Negative for fracture or focal lesion. Sinuses/Orbits: No acute finding. Other: None. IMPRESSION: No acute intracranial abnormality. Atrophy, chronic microvascular disease. Electronically Signed   By: Fidela Salisbury M.D.   On: 07/14/2017 00:27    Procedures Procedures (including critical care time)  Medications Ordered in ED Medications - No data to display   Initial Impression / Assessment and Plan / ED  Course  I have reviewed the triage vital signs and the nursing notes.  Pertinent labs & imaging results that were available during my care of the patient were reviewed by me and considered in my medical decision making (see chart for details).  Altered mental status of uncertain cause.  Need to evaluate for possible occult infection as well as electrolyte disturbance.  She will be sent for CT of head, chest x-ray.  Will check screening labs including urinalysis (she does make urine and is currently on furosemide).  12:17 AM Family has arrived and states that patient had been vomiting this morning and sleeping through most of the day.  This evening, family member noted that she was not speaking properly and that is when they called to get her to the hospital.  They state that she has been taken off of furosemide and is not making urine very often.  ED workup is unrevealing.  No evidence of UTI or pneumonia.  CT shows no acute process.  Laboratory workup shows no significant change from baseline.  Cause for altered mentation is not clear.  Decision is made to admit for further evaluation.  Case is discussed with Dr. Maudie Mercury of [facility] who agrees to admit the patient.  Final Clinical Impressions(s) / ED Diagnoses   Final diagnoses:  Altered mental status, unspecified altered mental status type  End-stage renal disease on hemodialysis [facility])    ED Discharge Orders    None       Delora Fuel, MD 96/29/52 [phone]

## 2017-07-14 ENCOUNTER — Inpatient Hospital Stay (HOSPITAL_COMMUNITY): Payer: Medicare Other

## 2017-07-14 ENCOUNTER — Other Ambulatory Visit (HOSPITAL_COMMUNITY): Payer: Medicare Other

## 2017-07-14 ENCOUNTER — Emergency Department (HOSPITAL_COMMUNITY): Payer: Medicare Other

## 2017-07-14 ENCOUNTER — Encounter (HOSPITAL_COMMUNITY): Payer: Self-pay | Admitting: Internal Medicine

## 2017-07-14 DIAGNOSIS — E1122 Type 2 diabetes mellitus with diabetic chronic kidney disease: Secondary | ICD-10-CM | POA: Diagnosis not present

## 2017-07-14 DIAGNOSIS — J810 Acute pulmonary edema: Secondary | ICD-10-CM | POA: Diagnosis not present

## 2017-07-14 DIAGNOSIS — Z79899 Other long term (current) drug therapy: Secondary | ICD-10-CM | POA: Diagnosis not present

## 2017-07-14 DIAGNOSIS — N186 End stage renal disease: Secondary | ICD-10-CM

## 2017-07-14 DIAGNOSIS — Z7982 Long term (current) use of aspirin: Secondary | ICD-10-CM | POA: Diagnosis not present

## 2017-07-14 DIAGNOSIS — E875 Hyperkalemia: Secondary | ICD-10-CM | POA: Diagnosis not present

## 2017-07-14 DIAGNOSIS — I129 Hypertensive chronic kidney disease with stage 1 through stage 4 chronic kidney disease, or unspecified chronic kidney disease: Secondary | ICD-10-CM | POA: Diagnosis not present

## 2017-07-14 DIAGNOSIS — R4182 Altered mental status, unspecified: Secondary | ICD-10-CM | POA: Diagnosis present

## 2017-07-14 DIAGNOSIS — I132 Hypertensive heart and chronic kidney disease with heart failure and with stage 5 chronic kidney disease, or end stage renal disease: Secondary | ICD-10-CM | POA: Diagnosis present

## 2017-07-14 DIAGNOSIS — N2581 Secondary hyperparathyroidism of renal origin: Secondary | ICD-10-CM | POA: Diagnosis present

## 2017-07-14 DIAGNOSIS — E669 Obesity, unspecified: Secondary | ICD-10-CM | POA: Diagnosis present

## 2017-07-14 DIAGNOSIS — Z992 Dependence on renal dialysis: Secondary | ICD-10-CM

## 2017-07-14 DIAGNOSIS — Z87891 Personal history of nicotine dependence: Secondary | ICD-10-CM | POA: Diagnosis not present

## 2017-07-14 DIAGNOSIS — D631 Anemia in chronic kidney disease: Secondary | ICD-10-CM | POA: Diagnosis present

## 2017-07-14 DIAGNOSIS — Z6831 Body mass index (BMI) 31.0-31.9, adult: Secondary | ICD-10-CM | POA: Diagnosis not present

## 2017-07-14 DIAGNOSIS — I2510 Atherosclerotic heart disease of native coronary artery without angina pectoris: Secondary | ICD-10-CM | POA: Diagnosis present

## 2017-07-14 DIAGNOSIS — I651 Occlusion and stenosis of basilar artery: Secondary | ICD-10-CM | POA: Diagnosis present

## 2017-07-14 DIAGNOSIS — I5042 Chronic combined systolic (congestive) and diastolic (congestive) heart failure: Secondary | ICD-10-CM | POA: Diagnosis present

## 2017-07-14 DIAGNOSIS — T428X5A Adverse effect of antiparkinsonism drugs and other central muscle-tone depressants, initial encounter: Secondary | ICD-10-CM | POA: Diagnosis present

## 2017-07-14 DIAGNOSIS — J90 Pleural effusion, not elsewhere classified: Secondary | ICD-10-CM | POA: Diagnosis not present

## 2017-07-14 DIAGNOSIS — I10 Essential (primary) hypertension: Secondary | ICD-10-CM | POA: Diagnosis not present

## 2017-07-14 DIAGNOSIS — Z794 Long term (current) use of insulin: Secondary | ICD-10-CM | POA: Diagnosis not present

## 2017-07-14 DIAGNOSIS — G92 Toxic encephalopathy: Secondary | ICD-10-CM | POA: Diagnosis present

## 2017-07-14 DIAGNOSIS — E139 Other specified diabetes mellitus without complications: Secondary | ICD-10-CM | POA: Diagnosis not present

## 2017-07-14 DIAGNOSIS — R4020 Unspecified coma: Secondary | ICD-10-CM | POA: Diagnosis not present

## 2017-07-14 DIAGNOSIS — J9811 Atelectasis: Secondary | ICD-10-CM | POA: Diagnosis present

## 2017-07-14 DIAGNOSIS — I6502 Occlusion and stenosis of left vertebral artery: Secondary | ICD-10-CM | POA: Diagnosis not present

## 2017-07-14 DIAGNOSIS — I739 Peripheral vascular disease, unspecified: Secondary | ICD-10-CM

## 2017-07-14 DIAGNOSIS — I6523 Occlusion and stenosis of bilateral carotid arteries: Secondary | ICD-10-CM | POA: Diagnosis not present

## 2017-07-14 DIAGNOSIS — E785 Hyperlipidemia, unspecified: Secondary | ICD-10-CM | POA: Diagnosis present

## 2017-07-14 DIAGNOSIS — I5030 Unspecified diastolic (congestive) heart failure: Secondary | ICD-10-CM | POA: Diagnosis not present

## 2017-07-14 DIAGNOSIS — E1151 Type 2 diabetes mellitus with diabetic peripheral angiopathy without gangrene: Secondary | ICD-10-CM | POA: Diagnosis present

## 2017-07-14 HISTORY — DX: Altered mental status, unspecified: R41.82

## 2017-07-14 LAB — CBC
HEMATOCRIT: 42.6 % (ref 36.0–46.0)
HEMOGLOBIN: 13.6 g/dL (ref 12.0–15.0)
MCH: 30.3 pg (ref 26.0–34.0)
MCHC: 31.9 g/dL (ref 30.0–36.0)
MCV: 94.9 fL (ref 78.0–100.0)
Platelets: 234 10*3/uL (ref 150–400)
RBC: 4.49 MIL/uL (ref 3.87–5.11)
RDW: 16.4 % — AB (ref 11.5–15.5)
WBC: 4.7 10*3/uL (ref 4.0–10.5)

## 2017-07-14 LAB — CBC WITH DIFFERENTIAL/PLATELET
BASOS ABS: 0 10*3/uL (ref 0.0–0.1)
BASOS PCT: 0 %
Eosinophils Absolute: 0.3 10*3/uL (ref 0.0–0.7)
Eosinophils Relative: 7 %
HEMATOCRIT: 42 % (ref 36.0–46.0)
Hemoglobin: 13.3 g/dL (ref 12.0–15.0)
Lymphocytes Relative: 35 %
Lymphs Abs: 1.7 10*3/uL (ref 0.7–4.0)
MCH: 30.1 pg (ref 26.0–34.0)
MCHC: 31.7 g/dL (ref 30.0–36.0)
MCV: 95 fL (ref 78.0–100.0)
Monocytes Absolute: 0.3 10*3/uL (ref 0.1–1.0)
Monocytes Relative: 6 %
Neutro Abs: 2.5 10*3/uL (ref 1.7–7.7)
Neutrophils Relative %: 52 %
PLATELETS: 220 10*3/uL (ref 150–400)
RBC: 4.42 MIL/uL (ref 3.87–5.11)
RDW: 16.2 % — AB (ref 11.5–15.5)
WBC: 4.8 10*3/uL (ref 4.0–10.5)

## 2017-07-14 LAB — COMPREHENSIVE METABOLIC PANEL
ALBUMIN: 3.3 g/dL — AB (ref 3.5–5.0)
ALBUMIN: 3.5 g/dL (ref 3.5–5.0)
ALK PHOS: 141 U/L — AB (ref 38–126)
ALT: 10 U/L — AB (ref 14–54)
ALT: 11 U/L — ABNORMAL LOW (ref 14–54)
ANION GAP: 16 — AB (ref 5–15)
AST: 20 U/L (ref 15–41)
AST: 21 U/L (ref 15–41)
Alkaline Phosphatase: 146 U/L — ABNORMAL HIGH (ref 38–126)
Anion gap: 16 — ABNORMAL HIGH (ref 5–15)
BILIRUBIN TOTAL: 0.5 mg/dL (ref 0.3–1.2)
BILIRUBIN TOTAL: 0.8 mg/dL (ref 0.3–1.2)
BUN: 46 mg/dL — AB (ref 6–20)
BUN: 53 mg/dL — ABNORMAL HIGH (ref 6–20)
CALCIUM: 7.8 mg/dL — AB (ref 8.9–10.3)
CHLORIDE: 95 mmol/L — AB (ref 101–111)
CO2: 25 mmol/L (ref 22–32)
CO2: 21 mmol/L — ABNORMAL LOW (ref 22–32)
CREATININE: 5.82 mg/dL — AB (ref 0.44–1.00)
Calcium: 7.8 mg/dL — ABNORMAL LOW (ref 8.9–10.3)
Chloride: 99 mmol/L — ABNORMAL LOW (ref 101–111)
Creatinine, Ser: 6.17 mg/dL — ABNORMAL HIGH (ref 0.44–1.00)
GFR calc Af Amer: 8 mL/min — ABNORMAL LOW (ref >60)
GFR calc Af Amer: 7 mL/min — ABNORMAL LOW (ref >60)
GFR, EST NON AFRICAN AMERICAN: 6 mL/min — AB (ref >60)
GFR, EST NON AFRICAN AMERICAN: 7 mL/min — AB (ref >60)
GLUCOSE: 119 mg/dL — AB (ref 65–99)
GLUCOSE: 121 mg/dL — AB (ref 65–99)
POTASSIUM: 4.7 mmol/L (ref 3.5–5.1)
POTASSIUM: 5.6 mmol/L — AB (ref 3.5–5.1)
Sodium: 136 mmol/L (ref 135–145)
Sodium: 136 mmol/L (ref 135–145)
TOTAL PROTEIN: 6.4 g/dL — AB (ref 6.5–8.1)
Total Protein: 6.7 g/dL (ref 6.5–8.1)

## 2017-07-14 LAB — URINALYSIS, ROUTINE W REFLEX MICROSCOPIC
BACTERIA UA: NONE SEEN
Bilirubin Urine: NEGATIVE
Glucose, UA: NEGATIVE mg/dL
Hgb urine dipstick: NEGATIVE
KETONES UR: NEGATIVE mg/dL
LEUKOCYTES UA: NEGATIVE
Nitrite: NEGATIVE
PROTEIN: 100 mg/dL — AB
Specific Gravity, Urine: 1.009 (ref 1.005–1.030)
pH: 6 (ref 5.0–8.0)

## 2017-07-14 LAB — LIPID PANEL
CHOL/HDL RATIO: 4.7 ratio
CHOLESTEROL: 267 mg/dL — AB (ref 0–200)
HDL: 57 mg/dL (ref >40)
LDL Cholesterol: 186 mg/dL — ABNORMAL HIGH (ref 0–99)
Triglycerides: 121 mg/dL (ref <150)
VLDL: 24 mg/dL (ref 0–40)

## 2017-07-14 LAB — HEMOGLOBIN A1C
Hgb A1c MFr Bld: 7.4 % — ABNORMAL HIGH (ref 4.8–5.6)
Mean Plasma Glucose: 165.68 mg/dL

## 2017-07-14 LAB — MRSA PCR SCREENING: MRSA by PCR: NEGATIVE

## 2017-07-14 LAB — CBG MONITORING, ED: GLUCOSE-CAPILLARY: 118 mg/dL — AB (ref 65–99)

## 2017-07-14 MED ORDER — HEPARIN SODIUM (PORCINE) 1000 UNIT/ML DIALYSIS: 1000.0000 [IU] | INTRAMUSCULAR | Status: DC | PRN

## 2017-07-14 MED ORDER — HEPARIN SODIUM (PORCINE) 1000 UNIT/ML DIALYSIS: 20.0000 [IU]/kg | INTRAMUSCULAR | Status: DC | PRN

## 2017-07-14 MED ORDER — CALCIUM ACETATE (PHOS BINDER) 667 MG PO CAPS
2001.0000 mg | ORAL_CAPSULE | Freq: Three times a day (TID) | ORAL | Status: DC
  Administered 2017-07-15 – 2017-07-19 (×12): 2001 mg via ORAL
  Filled 2017-07-14 (×12): qty 3

## 2017-07-14 MED ORDER — LIDOCAINE-PRILOCAINE 2.5-2.5 % EX CREA: 1.0000 "application " | TOPICAL_CREAM | CUTANEOUS | Status: DC | PRN

## 2017-07-14 MED ORDER — BRIMONIDINE TARTRATE-TIMOLOL 0.2-0.5 % OP SOLN: 1.0000 [drp] | Freq: Two times a day (BID) | OPHTHALMIC | Status: DC | PRN

## 2017-07-14 MED ORDER — ASPIRIN EC 81 MG PO TBEC
81.0000 mg | DELAYED_RELEASE_TABLET | Freq: Every day | ORAL | Status: DC
  Administered 2017-07-15 – 2017-07-19 (×5): 81 mg via ORAL
  Filled 2017-07-14 (×5): qty 1

## 2017-07-14 MED ORDER — IOPAMIDOL (ISOVUE-370) INJECTION 76%
INTRAVENOUS | Status: AC
  Administered 2017-07-14: 80 mL
  Filled 2017-07-14: qty 100

## 2017-07-14 MED ORDER — TIMOLOL MALEATE 0.5 % OP SOLN: 1.0000 [drp] | Freq: Two times a day (BID) | OPHTHALMIC | Status: DC | PRN

## 2017-07-14 MED ORDER — PENTAFLUOROPROP-TETRAFLUOROETH EX AERO: 1.0000 "application " | INHALATION_SPRAY | CUTANEOUS | Status: DC | PRN

## 2017-07-14 MED ORDER — BRIMONIDINE TARTRATE 0.2 % OP SOLN
1.0000 [drp] | Freq: Two times a day (BID) | OPHTHALMIC | Status: DC | PRN
  Filled 2017-07-14: qty 5

## 2017-07-14 MED ORDER — ISOSORBIDE MONONITRATE ER 30 MG PO TB24
30.0000 mg | ORAL_TABLET | Freq: Every day | ORAL | Status: DC
  Administered 2017-07-15: 30 mg via ORAL
  Filled 2017-07-14: qty 1

## 2017-07-14 MED ORDER — CARVEDILOL 25 MG PO TABS
25.0000 mg | ORAL_TABLET | Freq: Two times a day (BID) | ORAL | Status: DC
  Administered 2017-07-15 – 2017-07-19 (×7): 25 mg via ORAL
  Filled 2017-07-14 (×8): qty 1

## 2017-07-14 MED ORDER — SODIUM CHLORIDE 0.9 % IV SOLN: 100.0000 mL | INTRAVENOUS | Status: DC | PRN

## 2017-07-14 MED ORDER — ACETAMINOPHEN 325 MG PO TABS
650.0000 mg | ORAL_TABLET | ORAL | Status: DC | PRN
  Administered 2017-07-17 (×2): 650 mg via ORAL
  Filled 2017-07-14 (×2): qty 2

## 2017-07-14 MED ORDER — ACETAMINOPHEN 650 MG RE SUPP: 650.0000 mg | RECTAL | Status: DC | PRN

## 2017-07-14 MED ORDER — ACETAMINOPHEN 160 MG/5ML PO SOLN: 650.0000 mg | ORAL | Status: DC | PRN

## 2017-07-14 MED ORDER — ALTEPLASE 2 MG IJ SOLR: 2.0000 mg | Freq: Once | INTRAMUSCULAR | Status: DC | PRN

## 2017-07-14 MED ORDER — LIDOCAINE HCL (PF) 1 % IJ SOLN: 5.0000 mL | INTRAMUSCULAR | Status: DC | PRN

## 2017-07-14 MED ORDER — CALCITRIOL 0.5 MCG PO CAPS: 0.5000 ug | ORAL_CAPSULE | ORAL | Status: DC

## 2017-07-14 MED ORDER — RENA-VITE PO TABS
1.0000 | ORAL_TABLET | Freq: Every day | ORAL | Status: DC
  Administered 2017-07-16 – 2017-07-18 (×4): 1 via ORAL
  Filled 2017-07-14 (×5): qty 1

## 2017-07-14 MED ORDER — STROKE: EARLY STAGES OF RECOVERY BOOK
Freq: Once | Status: DC
  Filled 2017-07-14: qty 1

## 2017-07-14 MED ORDER — HYDRALAZINE HCL 25 MG PO TABS: 25.0000 mg | ORAL_TABLET | Freq: Three times a day (TID) | ORAL | Status: DC | PRN

## 2017-07-14 MED ORDER — DOXERCALCIFEROL 4 MCG/2ML IV SOLN
4.0000 ug | INTRAVENOUS | Status: DC
  Administered 2017-07-18: 4 ug via INTRAVENOUS
  Filled 2017-07-14 (×2): qty 2

## 2017-07-14 MED ORDER — CALCIUM ACETATE (PHOS BINDER) 667 MG PO CAPS
667.0000 mg | ORAL_CAPSULE | Freq: Three times a day (TID) | ORAL | Status: DC
  Filled 2017-07-14 (×2): qty 1

## 2017-07-14 MED ORDER — POLYETHYLENE GLYCOL 3350 17 G PO PACK
17.0000 g | PACK | Freq: Every day | ORAL | Status: DC
  Administered 2017-07-16: 17 g via ORAL
  Filled 2017-07-14 (×5): qty 1

## 2017-07-14 MED ORDER — ASPIRIN 81 MG PO TBEC: 81.0000 mg | DELAYED_RELEASE_TABLET | Freq: Every day | ORAL | Status: DC

## 2017-07-14 MED ORDER — FUROSEMIDE 20 MG PO TABS: 120.0000 mg | ORAL_TABLET | Freq: Every day | ORAL | Status: DC

## 2017-07-14 NOTE — ED Notes (Signed)
Pt arrives back from MRI.

## 2017-07-14 NOTE — ED Notes (Signed)
Pt being transported to MRI.

## 2017-07-14 NOTE — ED Notes (Signed)
Admitting at bedside along with nephrology PA unsure when pt when have dialysis. Pt did tell MD her name but only says yes or no to all other questions.

## 2017-07-14 NOTE — H&P (Signed)
TRH H&P   Patient Demographics:    Kathleen [name], is a 71 y.o. female  MRN: [id number]   DOB - 1946/06/06  Admit Date - 07/13/2017  Outpatient Primary MD for the patient is Benito Mccreedy, MD  Referring MD/NP/PA: Delora Fuel  Outpatient Specialists:   Patient coming from: home  Chief Complaint  Patient presents with  . Hypertension      HPI:    Kathleen [name]  is a 71 y.o. female, w hypertension, hyperlipidemia, Dm2, ckd stage 3, CAD,    In ED,   CXR    IMPRESSION: Stable cardiomegaly. Interstitial edema. Small left pleural effusion. Left basilar opacity is probably associated atelectasis.  CT brain IMPRESSION: No acute intracranial abnormality.  Atrophy, chronic microvascular disease.  ua wbc 0-5  Wbc 4.8, Hgb 13.3, Plt 220  Na 136, K 4.7, Bun 46,  Creatinine 5.82  Ast 20, Alt 10, Alk 146, T bili 0.8  Pt will be admitted for not feeling well, missing dialysis and sbbp >300    Review of systems:    In addition to the HPI above,  No Fever-chills, No Headache, No changes with Vision or hearing, No problems swallowing food or Liquids, No Chest pain, Cough or Shortness of Breath, No Abdominal pain, No Nausea or Vommitting, Bowel movements are regular, No Blood in stool or Urine, No dysuria, No new skin rashes or bruises, No new joints pains-aches,  + new weakness, tingling, numbness in any extremity,(right) No recent weight gain or loss, No polyuria, polydypsia or polyphagia, No significant Mental Stressors.  A full 10 point Review of Systems was done, except as stated above, all other Review of Systems were negative.   With Past History of the following :    Past Medical History:  Diagnosis Date  . Anemia    a. 01/2014: suspected of chronic disease, negative FOBT.  [name] CAD (coronary artery disease)    a. Cath 01/2014: mild in LAD,  mild-mod LCx, mod-severe RCA - for med rx.  . CKD (chronic kidney disease), stage III ([place])    a. Borderline III-IV.  . Diabetes mellitus without complication ([place])   . History of echocardiogram    Echo (10/15):  Mod LVH, EF 50-55%, Gr 1 DD, MAC, mild MR, mild TR, PASP 35 mmHg  . Hyperlipidemia   . Hypertension   . Meningioma ([place])    a. Incidental dx 01/2014.  . Obesity   . Systolic CHF ([place])    a. 01/7892: dx with mixed ICM/NICM (out of proportion to CAD) EF 25-30% by echo.       Past Surgical History:  Procedure Laterality Date  . ABDOMINAL HYSTERECTOMY    . AV FISTULA PLACEMENT Left 02/10/2017   Procedure: ARTERIOVENOUS (AV) FISTULA CREATION-LEFT FOREARM;  Surgeon: Elam Dutch, MD;  Location: [place];  Service: Vascular;  Laterality: Left;  . BREAST SURGERY    .  INSERTION OF DIALYSIS CATHETER Right 02/10/2017   Procedure: INSERTION OF DIALYSIS CATHETER;  Surgeon: Elam Dutch, MD;  Location: [facility];  Service: Vascular;  Laterality: Right;  . LEFT HEART CATHETERIZATION WITH CORONARY ANGIOGRAM N/A 01/27/2014   Procedure: LEFT HEART CATHETERIZATION WITH CORONARY ANGIOGRAM;  Surgeon: Jettie Booze, MD;  Location: [facility] CATH LAB;  Service: Cardiovascular;  Laterality: N/A;  . TONSILLECTOMY        Social History:     Social History   Tobacco Use  . Smoking status: Former Smoker    Packs/day: 0.25    Years: 30.00    Pack years: 7.50    Types: Cigarettes  . Smokeless tobacco: Never Used  Substance Use Topics  . Alcohol use: No     Lives - at home Mobility - ? uncertain   Family History :     Family History  Problem Relation Age of Onset  . Diabetic kidney disease Mother   . Hypertension Mother   . Heart failure Mother   . Heart attack Mother   . Hyperlipidemia Mother   . Hypertension Father       Home Medications:   Prior to Admission medications   Medication Sig Start Date End Date Taking? Authorizing Provider  aspirin 81 MG EC tablet Take 1 tablet  (81 mg total) by mouth daily. 12/25/15  Yes Cherene Altes, MD  calcitRIOL (ROCALTROL) 0.5 MCG capsule Take 1 capsule (0.5 mcg total) by mouth Every Tuesday,Thursday,and Saturday with dialysis. 02/21/17  Yes Mikhail, [place], DO  hydrALAZINE (APRESOLINE) 25 MG tablet Take 1 tablet (25 mg total) by mouth every 8 (eight) hours as needed (SBP > 170  or DBP > 110). 02/20/17  Yes Mikhail, Clinical biochemist, DO  insulin aspart (NOVOLOG) 100 UNIT/ML injection Inject 6 Units into the skin 3 (three) times daily with meals. CBG < 70: implement hypoglycemia protocol CBG 70 - 120: 0 units CBG 121 - 150: 1 unit CBG 151 - 200: 2 units CBG 201 - 250: 3 units CBG 251 - 300: 5 units CBG 301 - 350: 7 units CBG 351 - 400: 9 units CBG > 400: call MD. 12/22/15  Yes Cherene Altes, MD  insulin detemir (LEVEMIR) 100 UNIT/ML injection Inject 0.15 mLs (15 Units total) into the skin at bedtime. 02/20/17  Yes Mikhail, [name], DO  brimonidine-timolol (COMBIGAN) 0.2-0.5 % ophthalmic solution Place 1 drop into the right eye 2 (two) times daily as needed (for glaucoma).     [provider]  calcium acetate (PHOSLO) 667 MG capsule Take 667 mg by mouth 3 (three) times daily with meals.    [provider]  carvedilol (COREG) 25 MG tablet Take 25 mg by mouth 2 (two) times daily. 03/28/17   [provider]  furosemide (LASIX) 40 MG tablet Take 120 mg by mouth daily. 04/11/17   [provider]  isosorbide mononitrate (IMDUR) 30 MG 24 hr tablet Take 1 tablet (30 mg total) by mouth daily. 06/02/16   Lorretta Harp, MD  meclizine (ANTIVERT) 12.5 MG tablet Take 1 tablet (12.5 mg total) by mouth 3 (three) times daily as needed for dizziness or nausea. For dizzy spells 12/22/15   Cherene Altes, MD  multivitamin (RENA-VIT) TABS tablet Take 1 tablet by mouth at bedtime. 02/20/17   Cristal Ford, DO  oxyCODONE-acetaminophen (PERCOCET/ROXICET) 5-325 MG tablet Take 1 tablet by mouth every 4 (four) hours as  needed for moderate pain. 02/20/17   Mikhail, Velta Addison, DO  polyethylene glycol (  MIRALAX / GLYCOLAX) packet Take 17 g by mouth daily. 02/20/17   Cristal Ford, DO     Allergies:    No Known Allergies   Physical Exam:   Vitals  Blood pressure (!) 159/60, pulse 72, temperature 99 F (37.2 C), temperature source Oral, resp. rate 12, SpO2 95 %.   1. General ying in bed in NAD,  2. Normal affect and insight, Not Suicidal or Homicidal, Awake Alert, Oriented X 2  3. No F.N deficits, ALL C.Nerves Intact, Strength 5/5 all 4 extremities, Sensation intact all 4 extremities, Plantars down going.  4. Ears and Eyes appear Normal, Conjunctivae clear, PERRLA. Moist Oral Mucosa.  5. Supple Neck, No JVD, No cervical lymphadenopathy appriciated, No Carotid Bruits.  6. Symmetrical Chest wall movement, Good air movement bilaterally, CTAB.  7. RRR, No Gallops, Rubs or Murmurs, No Parasternal Heave.  8. Positive Bowel Sounds, Abdomen Soft, No tenderness, No organomegaly appriciated,No rebound -guarding or rigidity.  9.  No Cyanosis, Normal Skin Turgor, No Skin Rash or Bruise.  10. Good muscle tone,  joints appear normal , no effusions, Normal ROM.  11. No Palpable Lymph Nodes in Neck or Axillae     Data Review:    CBC Recent Labs  Lab 07/13/17 2335  WBC 4.8  HGB 13.3  HCT 42.0  PLT 220  MCV 95.0  MCH 30.1  MCHC 31.7  RDW 16.2*  LYMPHSABS 1.7  MONOABS 0.3  EOSABS 0.3  BASOSABS 0.0   ------------------------------------------------------------------------------------------------------------------  Chemistries  Recent Labs  Lab 07/13/17 2335  NA 136  K 4.7  CL 95*  CO2 25  GLUCOSE 121*  BUN 46*  CREATININE 5.82*  CALCIUM 7.8*  AST 20  ALT 10*  ALKPHOS 146*  BILITOT 0.8   ------------------------------------------------------------------------------------------------------------------ CrCl cannot be calculated (Unknown ideal  weight.). ------------------------------------------------------------------------------------------------------------------ No results for input(s): TSH, T4TOTAL, T3FREE, THYROIDAB in the last 72 hours.  Invalid input(s): FREET3  Coagulation profile No results for input(s): INR, PROTIME in the last 168 hours. ------------------------------------------------------------------------------------------------------------------- No results for input(s): DDIMER in the last 72 hours. -------------------------------------------------------------------------------------------------------------------  Cardiac Enzymes No results for input(s): CKMB, TROPONINI, MYOGLOBIN in the last 168 hours.  Invalid input(s): CK ------------------------------------------------------------------------------------------------------------------    Component Value Date/Time   BNP 463.4 (H) 02/07/2017 0029     ---------------------------------------------------------------------------------------------------------------  Urinalysis    Component Value Date/Time   COLORURINE YELLOW 07/13/2017 2335   APPEARANCEUR CLEAR 07/13/2017 2335   LABSPEC 1.009 07/13/2017 2335   PHURINE 6.0 07/13/2017 2335   GLUCOSEU NEGATIVE 07/13/2017 2335   HGBUR NEGATIVE 07/13/2017 2335   BILIRUBINUR NEGATIVE 07/13/2017 2335   KETONESUR NEGATIVE 07/13/2017 2335   PROTEINUR 100 (A) 07/13/2017 2335   UROBILINOGEN 0.2 06/11/2014 0639   NITRITE NEGATIVE 07/13/2017 2335   LEUKOCYTESUR NEGATIVE 07/13/2017 2335    ----------------------------------------------------------------------------------------------------------------   Imaging Results:    Dg Chest 2 View  Result Date: 07/14/2017 CLINICAL DATA:  71 y/o  F; altered mental status. EXAM: CHEST  2 VIEW COMPARISON:  02/10/2017 chest radiograph. FINDINGS: Stable cardiomegaly. Stable right central venous catheter with tip projecting over right atrium. Interstitial pulmonary edema.  Small left pleural effusion. Left basilar opacity probably represents associated atelectasis. No acute osseous abnormality is evident. IMPRESSION: Stable cardiomegaly. Interstitial edema. Small left pleural effusion. Left basilar opacity is probably associated atelectasis. Electronically Signed   By: Kristine Garbe M.D.   On: 07/14/2017 01:35   Ct Head Wo Contrast  Result Date: 07/14/2017 CLINICAL DATA:  Extreme hypertension.  Confusion. EXAM: CT HEAD WITHOUT  CONTRAST TECHNIQUE: Contiguous axial images were obtained from the base of the skull through the vertex without intravenous contrast. COMPARISON:  MRI of the brain 06/13/2014, head CT 06/10/2014 FINDINGS: Brain: No evidence of acute infarction, hemorrhage, hydrocephalus, extra-axial collection or mass lesion/mass effect. Moderate brain parenchymal volume loss and periventricular microangiopathy. Vascular: Calcific atherosclerotic disease at the skullbase. Skull: Normal. Negative for fracture or focal lesion. Sinuses/Orbits: No acute finding. Other: None. IMPRESSION: No acute intracranial abnormality. Atrophy, chronic microvascular disease. Electronically Signed   By: Fidela Salisbury M.D.   On: 07/14/2017 00:27      Assessment & Plan:    Active Problems:   Altered mental status    AMS Secondary to  Hypertensive emergency? Vs stroke vs uremia MRI/ MRA brain  R sided weakness MRI/ MRA brain Carotid ultrasound Cardiac echo Hga1c, lipid Aspirin   ESRD Please consult nephrology in Am for ? Dialysis  Dm2 fsbs q4h , ISS  Hypertension Hydralazine 5m iv q6h prn    DVT Prophylaxis Lovenox - SCDs  AM Labs Ordered, also please review Full Orders  Family Communication: Admission, patients condition and plan of care including tests being ordered have been discussed with the patient who indicate understanding and agree with the plan and Code Status.  Code Status  FULL CODE  Likely DC to  home  Condition GUARDED     Consults called: please contact nephrology  In am  Admission status: inipatient  Time spent in minutes : 45   JJani GravelM.D on 07/14/2017 at 4:22 AM  Between 7am to 7pm - Pager - [phone] After 7pm go to [email or web address] - password [email or web address] - Office  [phone]

## 2017-07-14 NOTE — Consult Note (Signed)
[facility] Renal Consultation Note    Indication for Consultation:  Management of ESRD/hemodialysis; anemia, hypertension/volume and secondary hyperparathyroidism   HPI: Kathleen [name] is a 71 y.o. female with ESRD secondary to DM/HTN/ cardiorenal syndrome. HD initiated during July 2018 admission CAD, EF 45%- 50 on Echo 02/2017. Dialysis TTS at [facility].   Presented to [facility] ED via EMS yesterday evening with confusion, altered mental status. Per ED notes, last seen normal around 3pm. She missed her scheduled dialysis Thursday due to not feeling well. Head CT neg, brain MRI with no acute findings but "poor signal in the left vertebral and basilar arteries that appears new from 2015" CTA head and neck pending. CXR with intersitial edema, small left pleural effusion. Labs showing Na 136, K 5.6, Glucose 119, Ca 7.8, WBC 4.7, Hgb 13.6.  Hypertensive in the ED with SBPs 160s-180s.   Seen in the ED alone, unable to obtain history from her. Responds to questions only with nodding or shaking head. Only verbalizes "um mm".  Grimaces when touched but does elaborate when asked about pain. Follows commands. Denies dyspnea, chest pain.   Last HD Tuesday 12/4. Usually compliant with HD.   Past Medical History:  Diagnosis Date  . Anemia    a. 01/2014: suspected of chronic disease, negative FOBT.  [name] CAD (coronary artery disease)    a. Cath 01/2014: mild in LAD, mild-mod LCx, mod-severe RCA - for med rx.  . CKD (chronic kidney disease), stage III ([place])    a. Borderline III-IV.  . Diabetes mellitus without complication ([place])   . History of echocardiogram    Echo (10/15):  Mod LVH, EF 50-55%, Gr 1 DD, MAC, mild MR, mild TR, PASP 35 mmHg  . Hyperlipidemia   . Hypertension   . Meningioma ([place])    a. Incidental dx 01/2014.  . Obesity   . Systolic CHF ([place])    a. 11/9447: dx with mixed ICM/NICM (out of proportion to CAD) EF 25-30% by echo.    Past Surgical History:   Procedure Laterality Date  . ABDOMINAL HYSTERECTOMY    . AV FISTULA PLACEMENT Left 02/10/2017   Procedure: ARTERIOVENOUS (AV) FISTULA CREATION-LEFT FOREARM;  Surgeon: Elam Dutch, MD;  Location: [place];  Service: Vascular;  Laterality: Left;  . BREAST SURGERY    . INSERTION OF DIALYSIS CATHETER Right 02/10/2017   Procedure: INSERTION OF DIALYSIS CATHETER;  Surgeon: Elam Dutch, MD;  Location: [place];  Service: Vascular;  Laterality: Right;  . LEFT HEART CATHETERIZATION WITH CORONARY ANGIOGRAM N/A 01/27/2014   Procedure: LEFT HEART CATHETERIZATION WITH CORONARY ANGIOGRAM;  Surgeon: Jettie Booze, MD;  Location: [facility] CATH LAB;  Service: Cardiovascular;  Laterality: N/A;  . TONSILLECTOMY     Family History  Problem Relation Age of Onset  . Diabetic kidney disease Mother   . Hypertension Mother   . Heart failure Mother   . Heart attack Mother   . Hyperlipidemia Mother   . Hypertension Father    Social History:  reports that she has quit smoking. Her smoking use included cigarettes. She has a 7.50 pack-year smoking history. she has never used smokeless tobacco. She reports that she does not drink alcohol or use drugs. No Known Allergies Prior to Admission medications   Medication Sig Start Date End Date Taking? Authorizing Provider  aspirin 81 MG EC tablet Take 1 tablet (81 mg total) by mouth daily. 12/25/15  Yes Cherene Altes, MD  baclofen (LIORESAL) 10 MG  tablet Take 10 mg by mouth 2 (two) times daily as needed. 07/10/17  Yes [provider]  brimonidine-timolol (COMBIGAN) 0.2-0.5 % ophthalmic solution Place 1 drop into the right eye 2 (two) times daily as needed (for glaucoma).    Yes [provider]  calcitRIOL (ROCALTROL) 0.5 MCG capsule Take 1 capsule (0.5 mcg total) by mouth Every Tuesday,Thursday,and Saturday with dialysis. 02/21/17  Yes Mikhail, [place], DO  calcium acetate (PHOSLO) 667 MG capsule Take 667 mg by mouth 3 (three) times daily with meals.    Yes [provider]  carvedilol (COREG) 25 MG tablet Take 25 mg by mouth 2 (two) times daily. 03/28/17  Yes [provider]  furosemide (LASIX) 40 MG tablet Take 120 mg by mouth daily. 04/11/17  Yes [provider]  hydrALAZINE (APRESOLINE) 25 MG tablet Take 1 tablet (25 mg total) by mouth every 8 (eight) hours as needed (SBP > 170  or DBP > 110). 02/20/17  Yes Mikhail, [place], DO  HYDROcodone-acetaminophen (NORCO/VICODIN) 5-325 MG tablet Take 1 tablet by mouth every 6 (six) hours as needed. 07/10/17  Yes [provider]  insulin aspart (NOVOLOG) 100 UNIT/ML injection Inject 6 Units into the skin 3 (three) times daily with meals. CBG < 70: implement hypoglycemia protocol CBG 70 - 120: 0 units CBG 121 - 150: 1 unit CBG 151 - 200: 2 units CBG 201 - 250: 3 units CBG 251 - 300: 5 units CBG 301 - 350: 7 units CBG 351 - 400: 9 units CBG > 400: call MD. 12/22/15  Yes Cherene Altes, MD  insulin detemir (LEVEMIR) 100 UNIT/ML injection Inject 0.15 mLs (15 Units total) into the skin at bedtime. Patient taking differently: Inject 30 Units into the skin at bedtime.  02/20/17  Yes Mikhail, Velta Addison, DO  polyethylene glycol (MIRALAX / GLYCOLAX) packet Take 17 g by mouth daily. 02/20/17  Yes Mikhail, Velta Addison, DO  isosorbide mononitrate (IMDUR) 30 MG 24 hr tablet Take 1 tablet (30 mg total) by mouth daily. Patient not taking: Reported on 07/14/2017 06/02/16   Lorretta Harp, MD  meclizine (ANTIVERT) 12.5 MG tablet Take 1 tablet (12.5 mg total) by mouth 3 (three) times daily as needed for dizziness or nausea. For dizzy spells 12/22/15   Cherene Altes, MD  multivitamin (RENA-VIT) TABS tablet Take 1 tablet by mouth at bedtime. 02/20/17   Cristal Ford, DO  oxyCODONE-acetaminophen (PERCOCET/ROXICET) 5-325 MG tablet Take 1 tablet by mouth every 4 (four) hours as needed for moderate pain. Patient not taking: Reported on 07/14/2017 02/20/17   Cristal Ford, DO   Current  Facility-Administered Medications  Medication Dose Route Frequency Provider Last Rate Last Dose  . iopamidol (ISOVUE-370) 76 % injection           .  stroke: mapping our early stages of recovery book   Does not apply Once Jani Gravel, MD      . acetaminophen (TYLENOL) tablet 650 mg  650 mg Oral Q4H PRN Jani Gravel, MD       Or  . acetaminophen (TYLENOL) solution 650 mg  650 mg Per Tube Q4H PRN Jani Gravel, MD       Or  . acetaminophen (TYLENOL) suppository 650 mg  650 mg Rectal Q4H PRN Jani Gravel, MD      . aspirin EC tablet 81 mg  81 mg Oral Daily Alekh, Kshitiz, MD      . brimonidine (ALPHAGAN) 0.2 % ophthalmic solution 1 drop  1 drop Right Eye BID  PRN Aline August, MD       And  . timolol (TIMOPTIC) 0.5 % ophthalmic solution 1 drop  1 drop Right Eye BID PRN Aline August, MD      . Derrill Memo ON 07/15/2017] calcitRIOL (ROCALTROL) capsule 0.5 mcg  0.5 mcg Oral Q T,Th,Sa-HD Jani Gravel, MD      . calcium acetate (PHOSLO) capsule 667 mg  667 mg Oral TID WC Jani Gravel, MD      . carvedilol (COREG) tablet 25 mg  25 mg Oral BID Jani Gravel, MD      . furosemide (LASIX) tablet 120 mg  120 mg Oral Daily Jani Gravel, MD      . hydrALAZINE (APRESOLINE) tablet 25 mg  25 mg Oral Q8H PRN Jani Gravel, MD      . isosorbide mononitrate (IMDUR) 24 hr tablet 30 mg  30 mg Oral Daily Jani Gravel, MD      . multivitamin (RENA-VIT) tablet 1 tablet  1 tablet Oral QHS Jani Gravel, MD      . polyethylene glycol (MIRALAX / Floria Raveling) packet 17 g  17 g Oral Daily Jani Gravel, MD       Current Outpatient Medications  Medication Sig Dispense Refill  . aspirin 81 MG EC tablet Take 1 tablet (81 mg total) by mouth daily. 30 tablet 3  . baclofen (LIORESAL) 10 MG tablet Take 10 mg by mouth 2 (two) times daily as needed.  0  . brimonidine-timolol (COMBIGAN) 0.2-0.5 % ophthalmic solution Place 1 drop into the right eye 2 (two) times daily as needed (for glaucoma).     . calcitRIOL (ROCALTROL) 0.5 MCG capsule Take 1 capsule (0.5 mcg  total) by mouth Every Tuesday,Thursday,and Saturday with dialysis.    [name] calcium acetate (PHOSLO) 667 MG capsule Take 667 mg by mouth 3 (three) times daily with meals.    . carvedilol (COREG) 25 MG tablet Take 25 mg by mouth 2 (two) times daily.  3  . furosemide (LASIX) 40 MG tablet Take 120 mg by mouth daily.  6  . hydrALAZINE (APRESOLINE) 25 MG tablet Take 1 tablet (25 mg total) by mouth every 8 (eight) hours as needed (SBP > 170  or DBP > 110). 90 tablet 0  . HYDROcodone-acetaminophen (NORCO/VICODIN) 5-325 MG tablet Take 1 tablet by mouth every 6 (six) hours as needed.  0  . insulin aspart (NOVOLOG) 100 UNIT/ML injection Inject 6 Units into the skin 3 (three) times daily with meals. CBG < 70: implement hypoglycemia protocol CBG 70 - 120: 0 units CBG 121 - 150: 1 unit CBG 151 - 200: 2 units CBG 201 - 250: 3 units CBG 251 - 300: 5 units CBG 301 - 350: 7 units CBG 351 - 400: 9 units CBG > 400: call MD. 10 mL 11  . insulin detemir (LEVEMIR) 100 UNIT/ML injection Inject 0.15 mLs (15 Units total) into the skin at bedtime. (Patient taking differently: Inject 30 Units into the skin at bedtime. ) 10 mL 11  . polyethylene glycol (MIRALAX / GLYCOLAX) packet Take 17 g by mouth daily. 14 each 0  . isosorbide mononitrate (IMDUR) 30 MG 24 hr tablet Take 1 tablet (30 mg total) by mouth daily. (Patient not taking: Reported on 07/14/2017) 90 tablet 2  . meclizine (ANTIVERT) 12.5 MG tablet Take 1 tablet (12.5 mg total) by mouth 3 (three) times daily as needed for dizziness or nausea. For dizzy spells 30 tablet 0  . multivitamin (RENA-VIT) TABS tablet Take 1 tablet by  mouth at bedtime. 30 tablet 0  . oxyCODONE-acetaminophen (PERCOCET/ROXICET) 5-325 MG tablet Take 1 tablet by mouth every 4 (four) hours as needed for moderate pain. (Patient not taking: Reported on 07/14/2017) 20 tablet 0    ROS: As per HPI otherwise negative.  Physical Exam: Vitals:   07/14/17 1051 07/14/17 1052 07/14/17 1053 07/14/17 1115   BP: (!) 187/82  (!) 186/83 (!) 183/81  Pulse: 77 75 74 79  Resp:  _0 Temp:      TempSrc:      SpO2: 94% 94% 94% 93%     General: WDWN elderly female lying in bed with eyes closed NAD  Head: NCAT sclera not icteric MMM Neck: Supple. No JVD No masses Lungs: CTA bilaterally without wheezes, rales, or rhonchi. Breathing is unlabored. Heart: RRR with S1 S2 Abdomen: soft NT + BS Lower extremities:without edema or ischemic changes, no open wounds  Neuro:  Follows commands (opens eyes, moves extremities)  Psych:  Responds to with nodding, nonverbal responses Dialysis Access: RIJ TDC , L AVF+ bruit   Labs: Basic Metabolic Panel: Recent Labs  Lab 07/13/17 2335 07/14/17 1041  NA 136 136  K 4.7 5.6*  CL 95* 99*  CO2 25 21*  GLUCOSE 121* 119*  BUN 46* 53*  CREATININE 5.82* 6.17*  CALCIUM 7.8* 7.8*   Liver Function Tests: Recent Labs  Lab 07/13/17 2335 07/14/17 1041  AST 20 21  ALT 10* 11*  ALKPHOS 146* 141*  BILITOT 0.8 0.5  PROT 6.7 6.4*  ALBUMIN 3.5 3.3*   No results for input(s): LIPASE, AMYLASE in the last 168 hours. No results for input(s): AMMONIA in the last 168 hours. CBC: Recent Labs  Lab 07/13/17 2335 07/14/17 1041  WBC 4.8 4.7  NEUTROABS 2.5  --   HGB 13.3 13.6  HCT 42.0 42.6  MCV 95.0 94.9  PLT 220 234   Cardiac Enzymes: No results for input(s): CKTOTAL, CKMB, CKMBINDEX, TROPONINI in the last 168 hours. CBG: Recent Labs  Lab 07/14/17 0014  GLUCAP 118*   Iron Studies: No results for input(s): IRON, TIBC, TRANSFERRIN, FERRITIN in the last 72 hours. Studies/Results: Dg Chest 2 View  Result Date: 07/14/2017 CLINICAL DATA:  71 y/o  F; altered mental status. EXAM: CHEST  2 VIEW COMPARISON:  02/10/2017 chest radiograph. FINDINGS: Stable cardiomegaly. Stable right central venous catheter with tip projecting over right atrium. Interstitial pulmonary edema. Small left pleural effusion. Left basilar opacity probably represents associated  atelectasis. No acute osseous abnormality is evident. IMPRESSION: Stable cardiomegaly. Interstitial edema. Small left pleural effusion. Left basilar opacity is probably associated atelectasis. Electronically Signed   By: Kristine Garbe M.D.   On: 07/14/2017 01:35   Ct Head Wo Contrast  Result Date: 07/14/2017 CLINICAL DATA:  Extreme hypertension.  Confusion. EXAM: CT HEAD WITHOUT CONTRAST TECHNIQUE: Contiguous axial images were obtained from the base of the skull through the vertex without intravenous contrast. COMPARISON:  MRI of the brain 06/13/2014, head CT 06/10/2014 FINDINGS: Brain: No evidence of acute infarction, hemorrhage, hydrocephalus, extra-axial collection or mass lesion/mass effect. Moderate brain parenchymal volume loss and periventricular microangiopathy. Vascular: Calcific atherosclerotic disease at the skullbase. Skull: Normal. Negative for fracture or focal lesion. Sinuses/Orbits: No acute finding. Other: None. IMPRESSION: No acute intracranial abnormality. Atrophy, chronic microvascular disease. Electronically Signed   By: Fidela Salisbury M.D.   On: 07/14/2017 00:27   Mr Brain Wo Contrast  Result Date: 07/14/2017 CLINICAL DATA:  Altered level of consciousness. EXAM: MRI HEAD WITHOUT  CONTRAST MRA HEAD WITHOUT CONTRAST TECHNIQUE: Multiplanar, multiecho pulse sequences of the brain and surrounding structures were obtained without intravenous contrast. Angiographic images of the head were obtained using MRA technique without contrast. COMPARISON:  06/13/2014 FINDINGS: MRI HEAD FINDINGS Brain: No acute infarction, hemorrhage, hydrocephalus, extra-axial collection. Mild cerebral white matter disease consistent with chronic small vessel ischemia given patient's risk factors. T2 isointense mass along the high left parietal convexity measuring 26 mm in diameter by 7 mm in thickness, essentially stable from 2015 and consistent with meningioma. Mild local cortical mass effect.  Vascular: Arterial findings below. Normal dural venous sinus flow voids. Skull and upper cervical spine: No focal marrow lesion. Stable mildly hypointense cervical spine marrow compared to prior, potentially from end-stage renal disease status. Sinuses/Orbits: Bilateral cataract resection. MRA HEAD FINDINGS There is poor or narrow flow in the left vertebral artery not entirely explained by tortuous course. This vessel does appear patent on T2 weighted imaging flow voids. There is a patent right vertebral artery the proximal basilar. Very poor signal throughout the basilar. Although there are fetal type bilateral posterior cerebral arteries mid basilar has lost its flow void on T2 weighted imaging, new from 01/11/2014 brain MRI (no previous angiography). Symmetric carotid arteries. Atheromatous type irregularity of the bilateral carotid siphons. No flow limiting stenosis in the anterior circulation. No branch occlusion. Negative for aneurysm. Bulbous appearance of the proximal right pica is attributed to tortuous vessel. These results were called by telephone at the time of interpretation on 07/14/2017 at 10:21 am to Dr. Aline August , who verbally acknowledged these results. IMPRESSION: Brain MRI: 1. No acute finding including infarct. 2. Mild chronic small vessel ischemia. 3. Known left parietal meningioma with mild cortical mass effect, essentially stable from 2015. Intracranial MRA: Poor signal in the left vertebral and basilar arteries that appears new from 2015. If basilar thrombosis is a clinical possibility suggest CTA head and neck. Electronically Signed   By: Monte Fantasia M.D.   On: 07/14/2017 10:24   Mr Jodene Nam Head Wo Contrast  Result Date: 07/14/2017 CLINICAL DATA:  Altered level of consciousness. EXAM: MRI HEAD WITHOUT CONTRAST MRA HEAD WITHOUT CONTRAST TECHNIQUE: Multiplanar, multiecho pulse sequences of the brain and surrounding structures were obtained without intravenous contrast. Angiographic  images of the head were obtained using MRA technique without contrast. COMPARISON:  06/13/2014 FINDINGS: MRI HEAD FINDINGS Brain: No acute infarction, hemorrhage, hydrocephalus, extra-axial collection. Mild cerebral white matter disease consistent with chronic small vessel ischemia given patient's risk factors. T2 isointense mass along the high left parietal convexity measuring 26 mm in diameter by 7 mm in thickness, essentially stable from 2015 and consistent with meningioma. Mild local cortical mass effect. Vascular: Arterial findings below. Normal dural venous sinus flow voids. Skull and upper cervical spine: No focal marrow lesion. Stable mildly hypointense cervical spine marrow compared to prior, potentially from end-stage renal disease status. Sinuses/Orbits: Bilateral cataract resection. MRA HEAD FINDINGS There is poor or narrow flow in the left vertebral artery not entirely explained by tortuous course. This vessel does appear patent on T2 weighted imaging flow voids. There is a patent right vertebral artery the proximal basilar. Very poor signal throughout the basilar. Although there are fetal type bilateral posterior cerebral arteries mid basilar has lost its flow void on T2 weighted imaging, new from 01/11/2014 brain MRI (no previous angiography). Symmetric carotid arteries. Atheromatous type irregularity of the bilateral carotid siphons. No flow limiting stenosis in the anterior circulation. No branch occlusion. Negative for aneurysm. Bulbous  appearance of the proximal right pica is attributed to tortuous vessel. These results were called by telephone at the time of interpretation on 07/14/2017 at 10:21 am to Dr. Aline August , who verbally acknowledged these results. IMPRESSION: Brain MRI: 1. No acute finding including infarct. 2. Mild chronic small vessel ischemia. 3. Known left parietal meningioma with mild cortical mass effect, essentially stable from 2015. Intracranial MRA: Poor signal in the left  vertebral and basilar arteries that appears new from 2015. If basilar thrombosis is a clinical possibility suggest CTA head and neck. Electronically Signed   By: Monte Fantasia M.D.   On: 07/14/2017 10:24    Dialysis Orders:  [facility] TTS 4h 180F 250/800 2/2.25 EDW 79kg UF Profile 4 Hep 3000 RIJ TDC, L AVF  Hectorol 58mg IV q HD Mircera 547m IV q 2 weeks (11/27) Venofer 5075mV q week (to start 12/11)  Outpatient Labs: Hgb 11.1 Tsat 34% Corr Ca 8.5 P 7.1 PTH 862  Assessment/Plan: 1.  AMS - unclear etiology at this point; Head CT/Brain MRI with no acute finding, CTA Head and neck pending - per primary 2.  ESRD -  TTS. Missed Thursday. HD off schedule today for volume, mild hyperkalemia  3.  Hypertension/volume  - BP elevated in ED/Coreg, hydralazine, Imdur on med list/ Mild edema on CXR - UF to EDW  4.  Anemia  - Hgb 13.6 no ESA needs currently  5.  Metabolic bone disease -  Follow labs here. Cont PhosLo binder  3 qac/Hectorol  6.  Nutrition - Renal diet/Prostat for low albumin when eating  7. AVF - per outpatient notes on 12/4 - infiltrated AVF again, resting, was to see [facility] this week, consider eval during admit 8. DM - per primary   OgeLynnda Child-C Car[place]psonger [phone]/02/2017, 11:52 AM

## 2017-07-14 NOTE — ED Notes (Signed)
Pt Still in MRI 

## 2017-07-14 NOTE — Progress Notes (Signed)
Kathleen [name] is a 71 y.o. female patient admitted from ED Alert x1 - no acute distress noted.  VSS - Blood pressure (!) 112/55, pulse 95, temperature 97.6 F (36.4 C), temperature source Oral, resp. rate 17, SpO2 95 %.    IV in place, occlusive dsg intact without redness.  Orientation to room, and floor completed with information packet given to patient/family.  Patient declined safety video at this time.  Admission INP armband ID verified with patient/family, and in place.   SR up x 2, fall assessment complete, with patient and family able to verbalize understanding of risk associated with falls, and verbalized understanding to call nsg before up out of bed.  Call light within reach, patient able to voice, and demonstrate understanding.  Skin, clean-dry- intact without evidence of bruising, or skin tears.   No evidence of skin break down noted on exam.     Will cont to eval and treat per MD orders.  Richardean Chimera, RN 07/14/2017 7:03 PM

## 2017-07-14 NOTE — ED Notes (Signed)
Report Called to dialysis nurse angelo.

## 2017-07-14 NOTE — ED Notes (Signed)
MRI states they are done with scan and waiting on transport to bring pt back to ED. Admitting MD in ED states he has already spoken with nephrology regarding dialysis.

## 2017-07-14 NOTE — ED Notes (Signed)
Pt is alert and will follow commands however she answers yes to all questions.

## 2017-07-14 NOTE — Consult Note (Signed)
Referring Physician: Dr Starla Link  Patient name: Kathleen [name] MRN: [id number] DOB: 04-16-1946 Sex: female  REASON FOR CONSULT: subclavian vertebral stenosis  HPI: Kathleen [name] is a 71 y.o. female who presented to the ER yesterday around 11 pm with altered mental status.  She had non focal neuro exam.  Had systolic blood pressure up to 323m Hg.  She is still confused and unable to provide any history only nods and smiles.  Oriented x 0.  Work up with head CT showed no stroke.  MRI showed no stroke. CTA neck showed no internal carotid artery stenosis, left vertebral occlusion, left subclavian stenosis, proximal basilar stenosis, no right vertebral stenosis.    Other medical problems include ESRD.  She had a recent left arm AV fistula infiltration and is dialyzing via a catheter.  She also has CAD, hyperlipidemia.  Her hypertension is poorly controlled currently.  Past Medical History:  Diagnosis Date  . Anemia    a. 01/2014: suspected of chronic disease, negative FOBT.  .Marland KitchenCAD (coronary artery disease)    a. Cath 01/2014: mild in LAD, mild-mod LCx, mod-severe RCA - for med rx.  . CKD (chronic kidney disease), stage III ([place]    a. Borderline III-IV.  . Diabetes mellitus without complication [place]   . History of echocardiogram    Echo (10/15):  Mod LVH, EF 50-55%, Gr 1 DD, MAC, mild MR, mild TR, PASP 35 mmHg  . Hyperlipidemia   . Hypertension   . Meningi[place]    a. Incidental dx 01/2014.  . Obesity   . Systolic CHF ([facility]    a. 69/8921 dx with mixed ICM/NICM (out of proportion to CAD) EF 25-30% by echo.    Past Surgical History:  Procedure Laterality Date  . ABDOMINAL HYSTERECTOMY    . AV FISTULA PLACEMENT Left 02/10/2017   Procedure: ARTERIOVENOUS (AV) FISTULA CREATION-LEFT FOREARM;  Surgeon: FElam Dutch MD;  [place]  Service: Vascular;  Laterality: Left;  . BREAST SURGERY    . INSERTION OF DIALYSIS CATHETER Right 02/10/2017   Procedure: INSERTION OF DIALYSIS  CATHETER;  Surgeon: FElam Dutch MD;  [place] [facility]  Service: Vascular;  Laterality: Right;  . LEFT HEART CATHETERIZATION WITH CORONARY ANGIOGRAM N/A 01/27/2014   Procedure: LEFT HEART CATHETERIZATION WITH CORONARY ANGIOGRAM;  Surgeon: JJettie Booze MD;  [facility];  Service: Cardiovascular;  Laterality: N/A;  . TONSILLECTOMY      Family History  Problem Relation Age of Onset  . Diabetic kidney disease Mother   . Hypertension Mother   . Heart failure Mother   . Heart attack Mother   . Hyperlipidemia Mother   . Hypertension Father     SOCIAL HISTORY: Social History   Socioeconomic History  . Marital status: Divorced    Spouse name: Not on file  . Number of children: Not on file  . Years of education: Not on file  . Highest education level: Not on file  Social Needs  . Financial resource strain: Not on file  . Food insecurity - worry: Not on file  . Food insecurity - inability: Not on file  . Transportation needs - medical: Not on file  . Transportation needs - non-medical: Not on file  Occupational History  . Not on file  Tobacco Use  . Smoking status: Former Smoker    Packs/day: 0.25    Years: 30.00    Pack years: 7.50    Types: Cigarettes  .  Smokeless tobacco: Never Used  Substance and Sexual Activity  . Alcohol use: No  . Drug use: No  . Sexual activity: No  Other Topics Concern  . Not on file  Social History Narrative  . Not on file    No Known Allergies  Current Facility-Administered Medications  Medication Dose Route Frequency Provider Last Rate Last Dose  .  stroke: mapping our early stages of recovery book   Does not apply Once Jani Gravel, MD      . acetaminophen (TYLENOL) tablet 650 mg  650 mg Oral Q4H PRN Jani Gravel, MD       Or  . acetaminophen (TYLENOL) solution 650 mg  650 mg Per Tube Q4H PRN Jani Gravel, MD       Or  . acetaminophen (TYLENOL) suppository 650 mg  650 mg Rectal Q4H PRN Jani Gravel, MD      . aspirin EC  tablet 81 mg  81 mg Oral Daily Alekh, Kshitiz, MD      . brimonidine (ALPHAGAN) 0.2 % ophthalmic solution 1 drop  1 drop Right Eye BID PRN Aline August, MD       And  . timolol (TIMOPTIC) 0.5 % ophthalmic solution 1 drop  1 drop Right Eye BID PRN Aline August, MD      . calcium acetate (PHOSLO) capsule 2,001 mg  2,001 mg Oral TID WC Ejigiri, Thomos Lemons, PA-C      . carvedilol (COREG) tablet 25 mg  25 mg Oral BID Jani Gravel, MD      . Derrill Memo ON 07/15/2017] doxercalciferol (HECTOROL) injection 4 mcg  4 mcg Intravenous Q T,Th,Sa-HD Ejigiri, Thomos Lemons, PA-C      . hydrALAZINE (APRESOLINE) tablet 25 mg  25 mg Oral Q8H PRN Jani Gravel, MD      . isosorbide mononitrate (IMDUR) 24 hr tablet 30 mg  30 mg Oral Daily Jani Gravel, MD      . multivitamin (RENA-VIT) tablet 1 tablet  1 tablet Oral QHS Jani Gravel, MD      . polyethylene glycol (MIRALAX / Floria Raveling) packet 17 g  17 g Oral Daily Jani Gravel, MD       Current Outpatient Medications  Medication Sig Dispense Refill  . aspirin 81 MG EC tablet Take 1 tablet (81 mg total) by mouth daily. 30 tablet 3  . baclofen (LIORESAL) 10 MG tablet Take 10 mg by mouth 2 (two) times daily as needed.  0  . brimonidine-timolol (COMBIGAN) 0.2-0.5 % ophthalmic solution Place 1 drop into the right eye 2 (two) times daily as needed (for glaucoma).     . calcitRIOL (ROCALTROL) 0.5 MCG capsule Take 1 capsule (0.5 mcg total) by mouth Every Tuesday,Thursday,and Saturday with dialysis.    [name] calcium acetate (PHOSLO) 667 MG capsule Take 667 mg by mouth 3 (three) times daily with meals.    . carvedilol (COREG) 25 MG tablet Take 25 mg by mouth 2 (two) times daily.  3  . furosemide (LASIX) 40 MG tablet Take 120 mg by mouth daily.  6  . hydrALAZINE (APRESOLINE) 25 MG tablet Take 1 tablet (25 mg total) by mouth every 8 (eight) hours as needed (SBP > 170  or DBP > 110). 90 tablet 0  . HYDROcodone-acetaminophen (NORCO/VICODIN) 5-325 MG tablet Take 1 tablet by mouth every 6 (six)  hours as needed.  0  . insulin aspart (NOVOLOG) 100 UNIT/ML injection Inject 6 Units into the skin 3 (three) times daily with meals. CBG < 70: implement  hypoglycemia protocol CBG 70 - 120: 0 units CBG 121 - 150: 1 unit CBG 151 - 200: 2 units CBG 201 - 250: 3 units CBG 251 - 300: 5 units CBG 301 - 350: 7 units CBG 351 - 400: 9 units CBG > 400: call MD. 10 mL 11  . insulin detemir (LEVEMIR) 100 UNIT/ML injection Inject 0.15 mLs (15 Units total) into the skin at bedtime. (Patient taking differently: Inject 30 Units into the skin at bedtime. ) 10 mL 11  . polyethylene glycol (MIRALAX / GLYCOLAX) packet Take 17 g by mouth daily. 14 each 0  . isosorbide mononitrate (IMDUR) 30 MG 24 hr tablet Take 1 tablet (30 mg total) by mouth daily. (Patient not taking: Reported on 07/14/2017) 90 tablet 2  . meclizine (ANTIVERT) 12.5 MG tablet Take 1 tablet (12.5 mg total) by mouth 3 (three) times daily as needed for dizziness or nausea. For dizzy spells 30 tablet 0  . multivitamin (RENA-VIT) TABS tablet Take 1 tablet by mouth at bedtime. 30 tablet 0  . oxyCODONE-acetaminophen (PERCOCET/ROXICET) 5-325 MG tablet Take 1 tablet by mouth every 4 (four) hours as needed for moderate pain. (Patient not taking: Reported on 07/14/2017) 20 tablet 0    ROS:   Unable to obtain due to altered mental status  Physical Examination  Vitals:   07/14/17 1130 07/14/17 1230 07/14/17 1300 07/14/17 1345  BP: (!) 166/62  (!) 176/94 (!) 181/82  Pulse: 85 82 83 76  Resp: _0 Temp:      TempSrc:      SpO2: (!) 87% 95% 95% 98%    There is no height or weight on file to calculate BMI.  General:  Follows some commands but not alert or oriented HEENT: Normal Neck: right side dialysis catheter Pulmonary: Clear to auscultation bilaterally Cardiac: Regular Rate and Rhythm  Abdomen: Soft, non-tender, non-distended Skin: No rash Extremity Pulses:  2+ radial, brachial pulses bilaterally, left arm AV fistula slightly deep  but easily palpable thrill Musculoskeletal: No deformity or edema  Neurologic: Upper and lower extremity motor 5/5 and symmetric  DATA:  CTA reviewed.  Calcified left subclavian artery.  50-70 percent stenosis.  Occluded left vert, Patent innominate right subclavian right very.  No significant carotid stenosis.  ASSESSMENT:  Pt with left vertebral artery occlusion but patent right vert which should compensate.  She has intracranial vascular disease with stenosis of proximal basilar but no infarct.  None of these findings would cause altered mental status.  Although she does have a stenosis of the left subclavian artery this is most likely clinically irrelevant for several reasons.  She has an easily palpable left radial pulse despite having a functional fistula above this.  Also since the vert on the left is occluded improved flow through this will do nothing to improve her posterior circulation.  No indication for any vascular surgical intervention for any of these findings.  Left arm AV fistula patent some ecchymosis can consider superficialization of this when patient is more stable.   PLAN:  See above.   [name], MD [facility] Office: [phone] Pager: [phone]

## 2017-07-14 NOTE — CV Procedure (Signed)
2D echo attempted at 1450 but patient in dialysis

## 2017-07-14 NOTE — Procedures (Signed)
Patient seen and examined on Hemodialysis. QB 400 via TDC, UF goal 3L  BP s are improving with UF.  MS is still not at baseline.  Treatment adjusted as needed.  Madelon Lips MD [facility] pgr [phone] 4:18 PM

## 2017-07-14 NOTE — Progress Notes (Signed)
Patient ID: Kathleen [name], female   DOB: 09/22/1945, 71 y.o.   MRN: [id number] Patient was admitted this morning for altered mental status.  Patient had missed dialysis yesterday.  I spoke to Dr. Bolivar Haw for nephrology consult.  I was called by the radiologist dating back to the MRI of the brain was negative for any acute infarct but there was poor signal in the left vertebral and basilar arteries that appears new from 2015.  CT angiogram of the head and neck was ordered as per the radiologist's recommendation.  Patient is already a dialysis patient and nephrologist/Dr. Hollie Salk is okay for the patient to receive contrast for the study.

## 2017-07-15 ENCOUNTER — Inpatient Hospital Stay (HOSPITAL_COMMUNITY): Payer: Medicare Other

## 2017-07-15 ENCOUNTER — Other Ambulatory Visit: Payer: Self-pay

## 2017-07-15 ENCOUNTER — Other Ambulatory Visit (HOSPITAL_COMMUNITY): Payer: Medicare Other

## 2017-07-15 DIAGNOSIS — I5030 Unspecified diastolic (congestive) heart failure: Secondary | ICD-10-CM

## 2017-07-15 DIAGNOSIS — I10 Essential (primary) hypertension: Secondary | ICD-10-CM

## 2017-07-15 MED ORDER — ISOSORBIDE MONONITRATE ER 30 MG PO TB24
30.0000 mg | ORAL_TABLET | Freq: Every day | ORAL | Status: DC
  Administered 2017-07-16 – 2017-07-19 (×4): 30 mg via ORAL
  Filled 2017-07-15 (×4): qty 1

## 2017-07-15 MED ORDER — ALTEPLASE 2 MG IJ SOLR
2.0000 mg | Freq: Once | INTRAMUSCULAR | Status: DC | PRN
  Filled 2017-07-15: qty 2

## 2017-07-15 MED ORDER — ATORVASTATIN CALCIUM 40 MG PO TABS
40.0000 mg | ORAL_TABLET | Freq: Every day | ORAL | Status: DC
  Administered 2017-07-15 – 2017-07-18 (×4): 40 mg via ORAL
  Filled 2017-07-15 (×4): qty 1

## 2017-07-15 MED ORDER — SODIUM CHLORIDE 0.9 % IV SOLN: 100.0000 mL | INTRAVENOUS | Status: DC | PRN

## 2017-07-15 MED ORDER — LIDOCAINE-PRILOCAINE 2.5-2.5 % EX CREA
1.0000 "application " | TOPICAL_CREAM | CUTANEOUS | Status: DC | PRN
  Filled 2017-07-15: qty 5

## 2017-07-15 MED ORDER — HEPARIN SODIUM (PORCINE) 1000 UNIT/ML DIALYSIS
1000.0000 [IU] | INTRAMUSCULAR | Status: DC | PRN
  Filled 2017-07-15: qty 1

## 2017-07-15 MED ORDER — PENTAFLUOROPROP-TETRAFLUOROETH EX AERO: 1.0000 "application " | INHALATION_SPRAY | CUTANEOUS | Status: DC | PRN

## 2017-07-15 MED ORDER — LIDOCAINE HCL (PF) 1 % IJ SOLN
5.0000 mL | INTRAMUSCULAR | Status: DC | PRN
  Filled 2017-07-15: qty 5

## 2017-07-15 MED ORDER — HEPARIN SODIUM (PORCINE) 1000 UNIT/ML DIALYSIS
3000.0000 [IU] | Freq: Once | INTRAMUSCULAR | Status: DC
Start: 2017-07-15 — End: 2017-07-18
  Filled 2017-07-15: qty 3

## 2017-07-15 NOTE — Evaluation (Signed)
Occupational Therapy Evaluation Patient Details Name: Kathleen [name] MRN: [id number] DOB: January 07, 1946 Today's Date: 07/15/2017    History of Present Illness 71 year old female with history of hypertension, hyperlipidemia, diabetes mellitus type 2, end-stage renal disease on hemodialysis and coronary artery disease presented with altered mental status and elevated blood pressure.  MRI of the brain was negative for acute stroke.   Clinical Impression   Pt admitted with the above diagnoses and presents with below problem list. Pt will benefit from continued acute OT to address the below listed deficits and maximize independence with basic ADLs prior to d/c to venue below. PTA pt was mod I with most ADLs, supervision for shower transfer. Pt presents with dizziness and giddiness impacting assist level with ADLs. Pt mod A with most ADLs, likely +2 to ambulate. Pt + dizziness immediately upon standing. Returned to supine. Notified nursing. When asked at end of session if she was dizzy pt reported "only when I stand" however visually pt's appearance indicated at least mild dizziness.    Follow Up Recommendations  SNF    Equipment Recommendations  Other (comment)(defer to next venue)    Recommendations for Other Services       Precautions / Restrictions Precautions Precautions: Fall Restrictions Other Position/Activity Restrictions: + dizziness in standing      Mobility Bed Mobility Overal bed mobility: Needs Assistance Bed Mobility: Supine to Sit;Sit to Supine     Supine to sit: Min assist Sit to supine: Min assist   General bed mobility comments: increased time, use of the rail and needed assist to get up onto her L elbow then able to rise and scoot slowly to the EOB.  Transfers Overall transfer level: Needs assistance Equipment used: Rolling walker (2 wheeled) Transfers: Sit to/from Stand Sit to Stand: Mod assist         General transfer comment: used rw. cues for scooting  closer to EOB prior to standing. Mod A to powerup and steady. Cues for sequencing with rw.    Balance Overall balance assessment: Needs assistance Sitting-balance support: No upper extremity supported Sitting balance-Leahy Scale: Good     Standing balance support: Bilateral upper extremity supported Standing balance-Leahy Scale: Poor Standing balance comment: reliant on assist                           ADL either performed or assessed with clinical judgement   ADL Overall ADL's : Needs assistance/impaired Eating/Feeding: Set up;Sitting   Grooming: Moderate assistance;Sitting Grooming Details (indicate cue type and reason): impaired cognition Upper Body Bathing: Moderate assistance   Lower Body Bathing: Maximal assistance;Sit to/from stand   Upper Body Dressing : Moderate assistance;Sitting   Lower Body Dressing: Maximal assistance;Sit to/from stand   Toilet Transfer: Maximal assistance;+2 for safety/equipment;Stand-pivot;BSC;RW Toilet Transfer Details (indicate cue type and reason): clinical judgement Toileting- Clothing Manipulation and Hygiene: Maximal assistance;Sit to/from stand         General ADL Comments: Pt completed bed mobility and stood briefly EOB. Immediately dizzy in standing and indicating need to lie back down. High fall risk.      Vision         Perception     Praxis      Pertinent Vitals/Pain Pain Assessment: No/denies pain     Hand Dominance Right   Extremity/Trunk Assessment Upper Extremity Assessment Upper Extremity Assessment: Generalized weakness   Lower Extremity Assessment Lower Extremity Assessment: Defer to PT evaluation   Cervical / Trunk Assessment  Cervical / Trunk Assessment: Kyphotic   Communication Communication Communication: No difficulties   Cognition Arousal/Alertness: Awake/alert Behavior During Therapy: WFL for tasks assessed/performed(giddy ) Overall Cognitive Status: Impaired/Different from  baseline Area of Impairment: Orientation;Attention;Following commands;Safety/judgement;Awareness;Problem solving                 Orientation Level: Situation;Time Current Attention Level: Focused;Sustained   Following Commands: Follows one step commands with increased time;Follows one step commands inconsistently Safety/Judgement: Decreased awareness of safety;Decreased awareness of deficits Awareness: Intellectual Problem Solving: Slow processing;Decreased initiation;Requires tactile cues General Comments: Pt swatting at mattress sitting EOB "There's no bug there...that bug must've fallen to the floor."    General Comments  + dizzy upon standing. Returned to supine. Notified nursing.     Exercises Exercises: (warm up ROM exercise at hips and knees)   Shoulder Instructions      Home Living Family/patient expects to be discharged to:: Private residence Living Arrangements: Children(son lives with her, dtr involved also) Available Help at Discharge: Family;Available PRN/intermittently(son and dtr work) Type of Home: House Home Access: Stairs to enter Technical brewer of Steps: 1 Entrance Stairs-Rails: None Home Layout: One level     Bathroom Shower/Tub: Occupational psychologist: Handicapped height     Home Equipment: Environmental consultant - 2 wheels;Bedside commode;Cane - single point;Shower seat - built in   Additional Comments: pt reports son or daughter are normally there or checking in      Prior Functioning/Environment Level of Independence: Independent with assistive device(s)        Comments: uses RW at home, family does the homemaking, pt bathes with daughter present for supervision, dresses independently        OT Problem List: Decreased activity tolerance;Impaired balance (sitting and/or standing);Decreased cognition;Decreased safety awareness;Decreased knowledge of use of DME or AE;Decreased knowledge of precautions      OT Treatment/Interventions:  Self-care/ADL training;DME and/or AE instruction;Therapeutic activities;Cognitive remediation/compensation;Patient/family education;Balance training    OT Goals(Current goals can be found in the care plan section) Acute Rehab OT Goals Patient Stated Goal: pt unable Time For Goal Achievement: 07/29/17 Potential to Achieve Goals: Good ADL Goals Pt Will Perform Grooming: with modified independence;sitting Pt Will Perform Upper Body Bathing: with modified independence;sitting Pt Will Perform Lower Body Bathing: with modified independence Pt Will Perform Upper Body Dressing: with modified independence Pt Will Perform Lower Body Dressing: with modified independence Pt Will Transfer to Toilet: with modified independence Pt Will Perform Toileting - Clothing Manipulation and hygiene: with modified independence Additional ADL Goal #1: Pt will complete bed mobility at mod I level to prepare for OOB ADLs.  OT Frequency: Min 2X/week   Barriers to D/C:            Co-evaluation              AM-PAC PT "6 Clicks" Daily Activity     Outcome Measure Help from another person eating meals?: None Help from another person taking care of personal grooming?: A Lot Help from another person toileting, which includes using toliet, bedpan, or urinal?: A Lot Help from another person bathing (including washing, rinsing, drying)?: A Lot Help from another person to put on and taking off regular upper body clothing?: A Lot Help from another person to put on and taking off regular lower body clothing?: A Lot 6 Click Score: 14   End of Session Equipment Utilized During Treatment: Gait belt;Rolling walker Nurse Communication: Other (comment)(+ dizzy on standing)  Activity Tolerance: Other (comment)(dizzy) Patient left: in  bed;with call bell/phone within reach;with bed alarm set  OT Visit Diagnosis: Unsteadiness on feet (R26.81);Muscle weakness (generalized) (M62.81);Dizziness and giddiness (R42)                 Time: 4782-9562 OT Time Calculation (min): 22 min Charges:  OT General Charges $OT Visit: 1 Visit OT Evaluation $OT Eval Low Complexity: 1 Low G-Codes:       Hortencia Pilar 07/15/2017, 3:09 PM

## 2017-07-15 NOTE — Plan of Care (Signed)
  Progressing Clinical Measurements: Ability to maintain clinical measurements within normal limits will improve 07/15/2017 0601 - Progressing by Verne Grain, RN Will remain free from infection 07/15/2017 0601 - Progressing by Verne Grain, RN Diagnostic test results will improve 07/15/2017 0601 - Progressing by Verne Grain, RN Activity: Risk for activity intolerance will decrease 07/15/2017 0601 - Progressing by Verne Grain, RN Nutrition: Adequate nutrition will be maintained 07/15/2017 0601 - Progressing by Verne Grain, RN Education: Knowledge of disease or condition will improve 07/15/2017 0601 - Progressing by Verne Grain, RN

## 2017-07-15 NOTE — Progress Notes (Signed)
OT Cancellation Note  Patient Details Name: Kathleen [name] MRN: [id number] DOB: [birth year]   Cancelled Treatment:    Reason Eval/Treat Not Completed: Patient at procedure or test/ unavailable. Pt is with SLP. Plan to reattempt.    Tyrone Schimke OTR/L Pager: [phone]  07/15/2017, 8:45 AM

## 2017-07-15 NOTE — Evaluation (Signed)
Speech Language Pathology Evaluation Patient Details Name: Kathleen [name] MRN: [id number] DOB: 06-09-1946 Today's Date: 07/15/2017 Time: 6333-5456 SLP Time Calculation (min) (ACUTE ONLY): 12 min  Problem List:  Patient Active Problem List   Diagnosis Date Noted  . Altered mental status 07/14/2017  . End-stage renal disease on hemodialysis ([place])   . Knee pain   . Acute lower UTI   . ESRD (end stage renal disease) ([place]) 02/15/2017  . Peripheral arterial disease ([place]) 02/23/2016  . Insulin dependent diabetes mellitus ([place]) 12/18/2015  . Anemia in chronic kidney disease 12/18/2015  . Acute renal failure superimposed on stage 4 chronic kidney disease ([place]) 08/13/2015  . Dyspnea   . Goals of care, counseling/discussion   . SOB (shortness of breath)   . Encounter for palliative care   . Pulmonary vascular congestion   . Pleural effusion, left   . Acute on chronic renal failure ([place])   . HLD (hyperlipidemia)   . Noncompliance with medication regimen   . Disorientation   . Somnolence   . Acute respiratory failure with hypercapnia ([place])   . Respiratory acidosis   . Hypernatremia   . Systolic and diastolic CHF, acute ([place])   . CHF, acute on chronic ([place]) 08/05/2015  . Acute renal failure superimposed on stage 3 chronic kidney disease ([place]) 08/05/2015  . Elevated troponin 08/05/2015  . CHF (congestive heart failure) ([place]) 08/05/2015  . Acute on chronic heart failure ([place]) 08/05/2015  . Acute on chronic combined systolic and diastolic congestive heart failure ([place])   . AKI (acute kidney injury) ([place])   . Left carotid bruit 11/20/2014  . Syncope and collapse 06/10/2014  . Syncope 06/10/2014  . Essential hypertension   . Unspecified vitamin D deficiency   . Cellulitis and abscess of right lower extremitiy 02/28/2014  . Chronic combined systolic and diastolic heart failure ([place]) 02/12/2014  . CAD (coronary artery disease)   . Obesity   . Meningioma ([place])   . Anemia   . CKD stage 4  due to type 2 diabetes mellitus ([place]) 01/24/2014  . Hyperlipidemia 01/22/2014  . BPPV (benign paroxysmal positional vertigo) 01/22/2014  . Hypertensive urgency 01/21/2014  . Tobacco abuse 01/21/2014  . Diabetes mellitus due to abnormal insulin ([place]) 01/21/2014   Past Medical History:  Past Medical History:  Diagnosis Date  . Anemia    a. 01/2014: suspected of chronic disease, negative FOBT.  [name] CAD (coronary artery disease)    a. Cath 01/2014: mild in LAD, mild-mod LCx, mod-severe RCA - for med rx.  . CKD (chronic kidney disease), stage III ([place])    a. Borderline III-IV.  . Diabetes mellitus without complication ([place])   . History of echocardiogram    Echo (10/15):  Mod LVH, EF 50-55%, Gr 1 DD, MAC, mild MR, mild TR, PASP 35 mmHg  . Hyperlipidemia   . Hypertension   . Meningioma ([place])    a. Incidental dx 01/2014.  . Obesity   . Systolic CHF ([place])    a. 09/5636: dx with mixed ICM/NICM (out of proportion to CAD) EF 25-30% by echo.    Past Surgical History:  Past Surgical History:  Procedure Laterality Date  . ABDOMINAL HYSTERECTOMY    . AV FISTULA PLACEMENT Left 02/10/2017   Procedure: ARTERIOVENOUS (AV) FISTULA CREATION-LEFT FOREARM;  Surgeon: Elam Dutch, MD;  Location: [facility];  Service: Vascular;  Laterality: Left;  . BREAST SURGERY    . INSERTION OF DIALYSIS CATHETER Right 02/10/2017   Procedure: INSERTION OF  DIALYSIS CATHETER;  Surgeon: Elam Dutch, MD;  Location: [facility];  Service: Vascular;  Laterality: Right;  . LEFT HEART CATHETERIZATION WITH CORONARY ANGIOGRAM N/A 01/27/2014   Procedure: LEFT HEART CATHETERIZATION WITH CORONARY ANGIOGRAM;  Surgeon: Jettie Booze, MD;  Location: [facility] CATH LAB;  Service: Cardiovascular;  Laterality: N/A;  . TONSILLECTOMY     HPI:  Kathleen [name] a 71 y.o.female, hx ESRD, recent left arm AV fisutula infiltration, CAD, hyperlipidemia,who presented to the ER 07/13/17 with altered mental status. She had non focal neuro exam. Had  systolic blood pressure up to 339m Hg. Work up with head CT showed no stroke. MRI showed no stroke. CTA neck showed no internal carotid artery stenosis, left vertebral occlusion, left subclavian stenosis, proximal basilar stenosis, no right vertebral stenosis.   Assessment / Plan / Recommendation Clinical Impression  Pt presents with confusion and altered mental status with no focal cranial nerve deficits noted. MMSE score 13/30, indicating severe cognitive impairment, with deficits noted in sustained attention, memory, safety awareness and reasoning/problem solving. Given negative imaging and acute nature of pt's mental status change, anticipate her deficits will resolve with medical treatment; no acute ST cognitive-linguistic needs identified. Will s/o for cognition, however continue to follow for dysphagia and diet advancement as her mental status improves.     SLP Assessment  SLP Recommendation/Assessment: Patient does not need any further Speech Lanaguage Pathology Services SLP Visit Diagnosis: Cognitive communication deficit (R41.841)    Follow Up Recommendations  Other (comment)(tbd)    Frequency and Duration min 2x/week         SLP Evaluation Cognition  Overall Cognitive Status: Impaired/Different from baseline Arousal/Alertness: Awake/alert Orientation Level: Oriented to person;Oriented to place;Disoriented to time;Disoriented to situation Attention: Focused;Sustained Focused Attention: Appears intact Sustained Attention: Impaired Sustained Attention Impairment: Verbal basic;Functional basic Memory: Impaired Memory Impairment: Storage deficit;Decreased recall of new information;Decreased short term memory Decreased Short Term Memory: Verbal basic;Functional basic Awareness: Impaired Awareness Impairment: Intellectual impairment Problem Solving: Impaired Problem Solving Impairment: Verbal basic;Functional basic Executive Function: (impaired) Behaviors:  Restless;Impulsive;Verbal agitation Safety/Judgment: Impaired       Comprehension  Auditory Comprehension Overall Auditory Comprehension: Appears within functional limits for tasks assessed Yes/No Questions: Within Functional Limits Commands: Within Functional Limits Conversation: Simple Visual Recognition/Discrimination Discrimination: Within Function Limits Reading Comprehension Reading Status: Within funtional limits(simple sentence)    Expression Expression Primary Mode of Expression: Verbal Verbal Expression Overall Verbal Expression: Appears within functional limits for tasks assessed Initiation: No impairment Automatic Speech: Name;Social Response Level of Generative/Spontaneous Verbalization: Conversation Repetition: No impairment Naming: Not tested Written Expression Dominant Hand: Right Written Expression: Exceptions to WFL(unable to write sentence; attention interfering)   Oral / Motor  Oral Motor/Sensory Function Overall Oral Motor/Sensory Function: Within functional limits Motor Speech Overall Motor Speech: Appears within functional limits for tasks assessed   GNoble MRockland CCC-SLP Speech-Language Pathologist  MAliene Altes12/03/2017, 9:32 AM

## 2017-07-15 NOTE — Progress Notes (Signed)
  Echocardiogram 2D Echocardiogram has been performed.  Kathleen [name] 07/15/2017, 5:20 PM

## 2017-07-15 NOTE — Progress Notes (Signed)
[facility] Progress Note  Subjective: is responsive and more alert, awakens easily and Ox 3  Vitals:   07/15/17 0200 07/15/17 0618 07/15/17 0941 07/15/17 1317  BP: 117/61 120/65 (!) 126/59 101/85  Pulse: 89 89 86 78  Resp:  18  18  Temp: 98.4 F (36.9 C) 98.3 F (36.8 C) 98.4 F (36.9 C) 98.2 F (36.8 C)  TempSrc: Oral Oral Oral Oral  SpO2: 95% 95% 93% 93%  Weight:   81.6 kg (179 lb 14.3 oz)   Height:   5\' 3"  (1.6 m)     Inpatient medications: .  stroke: mapping our early stages of recovery book   Does not apply Once  . aspirin EC  81 mg Oral Daily  . calcium acetate  2,001 mg Oral TID WC  . carvedilol  25 mg Oral BID  . doxercalciferol  4 mcg Intravenous Q T,Th,Sa-HD  . isosorbide mononitrate  30 mg Oral Daily  . multivitamin  1 tablet Oral QHS  . polyethylene glycol  17 g Oral Daily    acetaminophen **OR** acetaminophen (TYLENOL) oral liquid 160 mg/5 mL **OR** acetaminophen, brimonidine **AND** timolol, hydrALAZINE  Exam: General: WDWN elderly female lying in bed with eyes closed NAD  Head: NCAT sclera not icteric MMM Neck: Supple. No JVD No masses Lungs: CTA bilaterally without wheezes, rales, or rhonchi. Breathing is unlabored. Heart: RRR with S1 S2 Abdomen: soft NT + BS Lower extremities:without edema or ischemic changes, no open wounds  Neuro:  Follows commands (opens eyes, moves extremities)  Psych:  Responds to with nodding, nonverbal responses Dialysis Access: RIJ TDC , L AVF+ bruit     Dialysis: [facility]h    79kg    2/2.25 bath  Hep 3000    RIJ TDC/ L AVF -hect 4 ug -mircera 50 ug due 12/11 -venofer 50 q wk, start 12/ 11 -labs > Hb 11, tsat 34%,  Ca 8.5/ P 7, pth 862      Impression: 1. AMS - much better today.  Got HD yest, has Baclofen listed on her home med lists This med is notorious for AMS in ESRD pts and is actually contraindicated in ESRD.  Would dc baclo and use something else for muscle spasm, like Flexeril.   2. ESRD  due for HD today. Had HD yest in place of Thursday 3. Vol is 2kg up 4. HTN - BP's normal 5. CAD 6. DM2  Plan - dialysis today   [name] MD [facility] pager [phone]   07/15/2017, 1:43 PM   Recent Labs  Lab 07/13/17 2335 07/14/17 1041  NA 136 136  K 4.7 5.6*  CL 95* 99*  CO2 25 21*  GLUCOSE 121* 119*  BUN 46* 53*  CREATININE 5.82* 6.17*  CALCIUM 7.8* 7.8*   Recent Labs  Lab 07/13/17 2335 07/14/17 1041  AST 20 21  ALT 10* 11*  ALKPHOS 146* 141*  BILITOT 0.8 0.5  PROT 6.7 6.4*  ALBUMIN 3.5 3.3*   Recent Labs  Lab 07/13/17 2335 07/14/17 1041  WBC 4.8 4.7  NEUTROABS 2.5  --   HGB 13.3 13.6  HCT 42.0 42.6  MCV 95.0 94.9  PLT 220 234   Iron/TIBC/Ferritin/ %Sat    Component Value Date/Time   IRON 30 02/14/2017 1030   TIBC 216 (L) 02/14/2017 1030   FERRITIN 101 02/14/2017 1030   IRONPCTSAT 14 02/14/2017 1030

## 2017-07-15 NOTE — Progress Notes (Signed)
Patient oriented to self, intermittently able to follow commands. Patient laughs frequently when spoken to, inappropriately instead of answering questions.

## 2017-07-15 NOTE — Progress Notes (Signed)
Patient ID: Kathleen [name], female   DOB: [birth year], 71 y.o.   MRN: [id number]  PROGRESS NOTE    Kathleen [name]  HTD:[id number] DOB: 12/01/1945 DOA: 07/13/2017 PCP: Benito Mccreedy, MD   Brief Narrative:  71 year old female with history of hypertension, hyperlipidemia, diabetes mellitus type 2, end-stage renal disease on hemodialysis and coronary artery disease presented with altered mental status and elevated blood pressure.  MRI of the brain was negative for acute stroke.  Nephrology was consulted for continuation of dialysis.  Vascular surgery was consulted for abnormal findings on MRA of the head and neck. Assessment & Plan:   Active Problems:   Altered mental status  Acute encephalopathy/altered mental status -Questionable cause -Mental status has much improved this morning. -MRI of the brain was negative for acute stroke -Continue monitoring mental status -PT/OT/SLP evaluation   Question of right-sided weakness -MRI of the brain is negative for acute stroke - PT eval  Left vertebral artery occlusion along with stenosis of left subclavian artery -Vascular surgery consult appreciated.  No surgical interventions recommended at this time. -Altered mental status probably not because of these arterial occlusions as per vascular surgery  End-stage renal disease on hemodialysis -Continue dialysis as per nephrology schedule  Diabetes mellitus type 2 -Continue Accu-Cheks with coverage  Hypertension -Monitor blood pressure.  Continue isosorbide mononitrate and Coreg    DVT prophylaxis: We will start heparin Code Status: DNR Family Communication: None at bedside Disposition Plan: Home in 1-2 days  Consultants: Nephrology and vascular surgery  Procedures: None  Antimicrobials: None  Subjective: Patient seen and examined at bedside.  She is much more awake today and answers some questions, does not remember what month this is.  No overnight fever or  vomiting  Objective: Vitals:   07/14/17 2218 07/15/17 0200 07/15/17 0618 07/15/17 0941  BP: (!) 119/59 117/61 120/65 (!) 126/59  Pulse: 88 89 89 86  Resp: 18  18   Temp: 98.3 F (36.8 C) 98.4 F (36.9 C) 98.3 F (36.8 C) 98.4 F (36.9 C)  TempSrc: Oral Oral Oral Oral  SpO2: 94% 95% 95% 93%  Weight:    81.6 kg (179 lb 14.3 oz)  Height:    5\' 3"  (1.6 m)    Intake/Output Summary (Last 24 hours) at 07/15/2017 1053 Last data filed at 07/14/2017 2000 Gross per 24 hour  Intake 0 ml  Output 3000 ml  Net -3000 ml   Filed Weights   07/15/17 0941  Weight: 81.6 kg (179 lb 14.3 oz)    Examination:  General exam: Appears calm and comfortable  Respiratory system: Bilateral decreased breath sound at bases Cardiovascular system: S1 & S2 heard, rate controlled  gastrointestinal system: Abdomen is nondistended, soft and nontender. Normal bowel sounds heard. Central nervous system: Alert and oriented, does not remember the month. No focal neurological deficits. Moving extremities Extremities: No cyanosis, clubbing, edema    Data Reviewed: I have personally reviewed following labs and imaging studies  CBC: Recent Labs  Lab 07/13/17 2335 07/14/17 1041  WBC 4.8 4.7  NEUTROABS 2.5  --   HGB 13.3 13.6  HCT 42.0 42.6  MCV 95.0 94.9  PLT 220 726   Basic Metabolic Panel: Recent Labs  Lab 07/13/17 2335 07/14/17 1041  NA 136 136  K 4.7 5.6*  CL 95* 99*  CO2 25 21*  GLUCOSE 121* 119*  BUN 46* 53*  CREATININE 5.82* 6.17*  CALCIUM 7.8* 7.8*   GFR: Estimated Creatinine Clearance: 8.3 mL/min (A) (by C-G  formula based on SCr of 6.17 mg/dL (H)). Liver Function Tests: Recent Labs  Lab 07/13/17 2335 07/14/17 1041  AST 20 21  ALT 10* 11*  ALKPHOS 146* 141*  BILITOT 0.8 0.5  PROT 6.7 6.4*  ALBUMIN 3.5 3.3*   No results for input(s): LIPASE, AMYLASE in the last 168 hours. No results for input(s): AMMONIA in the last 168 hours. Coagulation Profile: No results for input(s):  INR, PROTIME in the last 168 hours. Cardiac Enzymes: No results for input(s): CKTOTAL, CKMB, CKMBINDEX, TROPONINI in the last 168 hours. BNP (last 3 results) No results for input(s): PROBNP in the last 8760 hours. HbA1C: Recent Labs    07/14/17 1041  HGBA1C 7.4*   CBG: Recent Labs  Lab 07/14/17 0014  GLUCAP 118*   Lipid Profile: Recent Labs    07/14/17 1041  CHOL 267*  HDL 57  LDLCALC 186*  TRIG 121  CHOLHDL 4.7   Thyroid Function Tests: No results for input(s): TSH, T4TOTAL, FREET4, T3FREE, THYROIDAB in the last 72 hours. Anemia Panel: No results for input(s): VITAMINB12, FOLATE, FERRITIN, TIBC, IRON, RETICCTPCT in the last 72 hours. Sepsis Labs: No results for input(s): PROCALCITON, LATICACIDVEN in the last 168 hours.  Recent Results (from the past 240 hour(s))  MRSA PCR Screening     Status: None   Collection Time: 07/14/17  7:03 PM  Result Value Ref Range Status   MRSA by PCR NEGATIVE NEGATIVE Final    Comment:        The GeneXpert MRSA Assay (FDA approved for NASAL specimens only), is one component of a comprehensive MRSA colonization surveillance program. It is not intended to diagnose MRSA infection nor to guide or monitor treatment for MRSA infections.          Radiology Studies: Ct Angio Head W Or Wo Contrast  Result Date: 07/14/2017 CLINICAL DATA:  Right-sided weakness. Abnormal MRA with decreased signal in the left vertebral artery and basilar artery. Possible stricture or occlusion. Add EXAM: CT ANGIOGRAPHY HEAD AND NECK TECHNIQUE: Multidetector CT imaging of the head and neck was performed using the standard protocol during bolus administration of intravenous contrast. Multiplanar CT image reconstructions and MIPs were obtained to evaluate the vascular anatomy. Carotid stenosis measurements (when applicable) are obtained utilizing NASCET criteria, using the distal internal carotid diameter as the denominator. CONTRAST:  1mL ISOVUE-370 IOPAMIDOL  (ISOVUE-370) INJECTION 76% COMPARISON:  None. FINDINGS: CT HEAD FINDINGS Brain: No acute infarct, hemorrhage, or mass lesion is present. The ventricles are of normal size. No subarachnoid hemorrhage is present. The left parasagittal parietal meningioma is again noted. Vascular: Atherosclerotic calcifications are present in the cavernous internal carotid artery is bilaterally. There is no hyperdense vessel. Skull: The calvarium is intact. No focal lytic or blastic lesion is present. Sinuses: The paranasal sinuses and mastoid air cells are clear. Orbits: Globes and orbits are within normal limits. Bilateral lens replacements are noted. Review of the MIP images confirms the above findings CTA NECK FINDINGS Aortic arch: There is a common origin of the left common carotid artery and the innominate artery. Atherosclerotic calcifications are present at the origins the great vessels without significant origins stenoses. Calcified and noncalcified plaque narrows the left subclavian artery to 1.8 mm. This compares with a more distal measurement of 6.5 mm. Right carotid system: The right common carotid artery is tortuous. There is some atherosclerotic change distally without significant stenosis. Calcified and noncalcified plaque is present at the right carotid origin without a significant stenosis relative to the  more distal vessels. There is tortuosity of the cervical right ICA. Left carotid system: The left common carotid artery demonstrates mild distal narrowing of less than 50%. Calcified and noncalcified plaque is present at the left carotid bifurcation. There is no significant stenosis relative to the more distal vessel. Moderate proximal tortuosity is present in the cervical left ICA without significant stenosis. Vertebral arteries: The right vertebral artery originates from the subclavian artery without significant stenosis. There is proximal tortuosity. The left vertebral artery is occluded proximally with dense  calcification at its origin. The slightly more distal thyrocervical trunk is intact. There is faint contrast in the cervical left vertebral artery as low as C6. Contrast is better seen in the more distal left vertebral artery, particularly above the C2-3 junction the vertebral arteries are codominant at the dural margin. Skeleton: Multilevel endplate degenerative changes are most pronounced at C5-6 and C6-7. There is reversal of the normal cervical lordosis. Other neck: A right IJ dialysis catheter is in place. Soft tissues the neck are otherwise unremarkable. No focal mucosal or submucosal lesions are present. Parotid and submandibular glands are within normal limits. Upper chest: The lung apices are clear. The superior mediastinum is within normal limits. Review of the MIP images confirms the above findings CTA HEAD FINDINGS Anterior circulation: Atherosclerotic calcifications are present in the cavernous internal carotid artery is bilaterally. There is mild narrowing of less than 50% involving the paraophthalmic right ICA and precavernous left ICA. Mild narrowing of the terminal left ICA is less than 50%. Mild irregularity is present in the distal M1 segment without significant stenosis. The A1 and M1 segments are otherwise within normal limits. MCA bifurcations are intact. There is mild irregularity in the ACA and MCA branch vessels distally without significant proximal stenosis or aneurysm. There is no significant stenosis through the ICA terminus on either side. Posterior circulation: The vertebral arteries are codominant at the dural margin. PICA origins are visualized and normal. There is moderate to severe stenosis of the proximal basilar artery with reconstituted hypoplastic caliber more distally. The superior cerebellar artery is are intact. A small P1 segment is present on the right. There is mild narrowing of proximal P2 segments bilaterally. Venous sinuses: The dural sinuses are patent. Cortical veins  are within normal limits. Anatomic variants: Fetal type posterior cerebral artery is bilaterally. Delayed phase: The postcontrast images demonstrate no pathologic enhancement. Review of the MIP images confirms the above findings IMPRESSION: 1. Occluded proximal left vertebral artery with reconstituted flow at the level of C6. This may be retrograde. 2. Hypoplastic distal vertebral artery is not basilar artery with fetal type posterior cerebral arteries bilaterally. 3. Mild narrowing of the distal common internal carotid artery is and at the carotid bifurcations bilaterally without significant stenosis. 4. Mild narrowing within the cavernous internal carotid artery is bilaterally without significant stenosis relative to the more distal vessels. 5. Moderate to high-grade stenosis of the proximal vertebral artery may be flow limiting given the signal loss on MRI. 6. High-grade stenosis of the left subclavian artery proximal to the left vertebral artery. Electronically Signed   By: San Morelle M.D.   On: 07/14/2017 13:28   Dg Chest 2 View  Result Date: 07/14/2017 CLINICAL DATA:  71 y/o  F; altered mental status. EXAM: CHEST  2 VIEW COMPARISON:  02/10/2017 chest radiograph. FINDINGS: Stable cardiomegaly. Stable right central venous catheter with tip projecting over right atrium. Interstitial pulmonary edema. Small left pleural effusion. Left basilar opacity probably represents associated atelectasis. No  acute osseous abnormality is evident. IMPRESSION: Stable cardiomegaly. Interstitial edema. Small left pleural effusion. Left basilar opacity is probably associated atelectasis. Electronically Signed   By: Kristine Garbe M.D.   On: 07/14/2017 01:35   Ct Head Wo Contrast  Result Date: 07/14/2017 CLINICAL DATA:  Extreme hypertension.  Confusion. EXAM: CT HEAD WITHOUT CONTRAST TECHNIQUE: Contiguous axial images were obtained from the base of the skull through the vertex without intravenous contrast.  COMPARISON:  MRI of the brain 06/13/2014, head CT 06/10/2014 FINDINGS: Brain: No evidence of acute infarction, hemorrhage, hydrocephalus, extra-axial collection or mass lesion/mass effect. Moderate brain parenchymal volume loss and periventricular microangiopathy. Vascular: Calcific atherosclerotic disease at the skullbase. Skull: Normal. Negative for fracture or focal lesion. Sinuses/Orbits: No acute finding. Other: None. IMPRESSION: No acute intracranial abnormality. Atrophy, chronic microvascular disease. Electronically Signed   By: Fidela Salisbury M.D.   On: 07/14/2017 00:27   Ct Angio Neck W Or Wo Contrast  Result Date: 07/14/2017 CLINICAL DATA:  Right-sided weakness. Abnormal MRA with decreased signal in the left vertebral artery and basilar artery. Possible stricture or occlusion. Add EXAM: CT ANGIOGRAPHY HEAD AND NECK TECHNIQUE: Multidetector CT imaging of the head and neck was performed using the standard protocol during bolus administration of intravenous contrast. Multiplanar CT image reconstructions and MIPs were obtained to evaluate the vascular anatomy. Carotid stenosis measurements (when applicable) are obtained utilizing NASCET criteria, using the distal internal carotid diameter as the denominator. CONTRAST:  75mL ISOVUE-370 IOPAMIDOL (ISOVUE-370) INJECTION 76% COMPARISON:  None. FINDINGS: CT HEAD FINDINGS Brain: No acute infarct, hemorrhage, or mass lesion is present. The ventricles are of normal size. No subarachnoid hemorrhage is present. The left parasagittal parietal meningioma is again noted. Vascular: Atherosclerotic calcifications are present in the cavernous internal carotid artery is bilaterally. There is no hyperdense vessel. Skull: The calvarium is intact. No focal lytic or blastic lesion is present. Sinuses: The paranasal sinuses and mastoid air cells are clear. Orbits: Globes and orbits are within normal limits. Bilateral lens replacements are noted. Review of the MIP images  confirms the above findings CTA NECK FINDINGS Aortic arch: There is a common origin of the left common carotid artery and the innominate artery. Atherosclerotic calcifications are present at the origins the great vessels without significant origins stenoses. Calcified and noncalcified plaque narrows the left subclavian artery to 1.8 mm. This compares with a more distal measurement of 6.5 mm. Right carotid system: The right common carotid artery is tortuous. There is some atherosclerotic change distally without significant stenosis. Calcified and noncalcified plaque is present at the right carotid origin without a significant stenosis relative to the more distal vessels. There is tortuosity of the cervical right ICA. Left carotid system: The left common carotid artery demonstrates mild distal narrowing of less than 50%. Calcified and noncalcified plaque is present at the left carotid bifurcation. There is no significant stenosis relative to the more distal vessel. Moderate proximal tortuosity is present in the cervical left ICA without significant stenosis. Vertebral arteries: The right vertebral artery originates from the subclavian artery without significant stenosis. There is proximal tortuosity. The left vertebral artery is occluded proximally with dense calcification at its origin. The slightly more distal thyrocervical trunk is intact. There is faint contrast in the cervical left vertebral artery as low as C6. Contrast is better seen in the more distal left vertebral artery, particularly above the C2-3 junction the vertebral arteries are codominant at the dural margin. Skeleton: Multilevel endplate degenerative changes are most pronounced at C5-6 and  C6-7. There is reversal of the normal cervical lordosis. Other neck: A right IJ dialysis catheter is in place. Soft tissues the neck are otherwise unremarkable. No focal mucosal or submucosal lesions are present. Parotid and submandibular glands are within normal  limits. Upper chest: The lung apices are clear. The superior mediastinum is within normal limits. Review of the MIP images confirms the above findings CTA HEAD FINDINGS Anterior circulation: Atherosclerotic calcifications are present in the cavernous internal carotid artery is bilaterally. There is mild narrowing of less than 50% involving the paraophthalmic right ICA and precavernous left ICA. Mild narrowing of the terminal left ICA is less than 50%. Mild irregularity is present in the distal M1 segment without significant stenosis. The A1 and M1 segments are otherwise within normal limits. MCA bifurcations are intact. There is mild irregularity in the ACA and MCA branch vessels distally without significant proximal stenosis or aneurysm. There is no significant stenosis through the ICA terminus on either side. Posterior circulation: The vertebral arteries are codominant at the dural margin. PICA origins are visualized and normal. There is moderate to severe stenosis of the proximal basilar artery with reconstituted hypoplastic caliber more distally. The superior cerebellar artery is are intact. A small P1 segment is present on the right. There is mild narrowing of proximal P2 segments bilaterally. Venous sinuses: The dural sinuses are patent. Cortical veins are within normal limits. Anatomic variants: Fetal type posterior cerebral artery is bilaterally. Delayed phase: The postcontrast images demonstrate no pathologic enhancement. Review of the MIP images confirms the above findings IMPRESSION: 1. Occluded proximal left vertebral artery with reconstituted flow at the level of C6. This may be retrograde. 2. Hypoplastic distal vertebral artery is not basilar artery with fetal type posterior cerebral arteries bilaterally. 3. Mild narrowing of the distal common internal carotid artery is and at the carotid bifurcations bilaterally without significant stenosis. 4. Mild narrowing within the cavernous internal carotid  artery is bilaterally without significant stenosis relative to the more distal vessels. 5. Moderate to high-grade stenosis of the proximal vertebral artery may be flow limiting given the signal loss on MRI. 6. High-grade stenosis of the left subclavian artery proximal to the left vertebral artery. Electronically Signed   By: San Morelle M.D.   On: 07/14/2017 13:28   Mr Brain Wo Contrast  Result Date: 07/14/2017 CLINICAL DATA:  Altered level of consciousness. EXAM: MRI HEAD WITHOUT CONTRAST MRA HEAD WITHOUT CONTRAST TECHNIQUE: Multiplanar, multiecho pulse sequences of the brain and surrounding structures were obtained without intravenous contrast. Angiographic images of the head were obtained using MRA technique without contrast. COMPARISON:  06/13/2014 FINDINGS: MRI HEAD FINDINGS Brain: No acute infarction, hemorrhage, hydrocephalus, extra-axial collection. Mild cerebral white matter disease consistent with chronic small vessel ischemia given patient's risk factors. T2 isointense mass along the high left parietal convexity measuring 26 mm in diameter by 7 mm in thickness, essentially stable from 2015 and consistent with meningioma. Mild local cortical mass effect. Vascular: Arterial findings below. Normal dural venous sinus flow voids. Skull and upper cervical spine: No focal marrow lesion. Stable mildly hypointense cervical spine marrow compared to prior, potentially from end-stage renal disease status. Sinuses/Orbits: Bilateral cataract resection. MRA HEAD FINDINGS There is poor or narrow flow in the left vertebral artery not entirely explained by tortuous course. This vessel does appear patent on T2 weighted imaging flow voids. There is a patent right vertebral artery the proximal basilar. Very poor signal throughout the basilar. Although there are fetal type bilateral posterior cerebral arteries  mid basilar has lost its flow void on T2 weighted imaging, new from 01/11/2014 brain MRI (no previous  angiography). Symmetric carotid arteries. Atheromatous type irregularity of the bilateral carotid siphons. No flow limiting stenosis in the anterior circulation. No branch occlusion. Negative for aneurysm. Bulbous appearance of the proximal right pica is attributed to tortuous vessel. These results were called by telephone at the time of interpretation on 07/14/2017 at 10:21 am to Dr. Aline August , who verbally acknowledged these results. IMPRESSION: Brain MRI: 1. No acute finding including infarct. 2. Mild chronic small vessel ischemia. 3. Known left parietal meningioma with mild cortical mass effect, essentially stable from 2015. Intracranial MRA: Poor signal in the left vertebral and basilar arteries that appears new from 2015. If basilar thrombosis is a clinical possibility suggest CTA head and neck. Electronically Signed   By: Monte Fantasia M.D.   On: 07/14/2017 10:24   Mr Jodene Nam Head Wo Contrast  Result Date: 07/14/2017 CLINICAL DATA:  Altered level of consciousness. EXAM: MRI HEAD WITHOUT CONTRAST MRA HEAD WITHOUT CONTRAST TECHNIQUE: Multiplanar, multiecho pulse sequences of the brain and surrounding structures were obtained without intravenous contrast. Angiographic images of the head were obtained using MRA technique without contrast. COMPARISON:  06/13/2014 FINDINGS: MRI HEAD FINDINGS Brain: No acute infarction, hemorrhage, hydrocephalus, extra-axial collection. Mild cerebral white matter disease consistent with chronic small vessel ischemia given patient's risk factors. T2 isointense mass along the high left parietal convexity measuring 26 mm in diameter by 7 mm in thickness, essentially stable from 2015 and consistent with meningioma. Mild local cortical mass effect. Vascular: Arterial findings below. Normal dural venous sinus flow voids. Skull and upper cervical spine: No focal marrow lesion. Stable mildly hypointense cervical spine marrow compared to prior, potentially from end-stage renal disease  status. Sinuses/Orbits: Bilateral cataract resection. MRA HEAD FINDINGS There is poor or narrow flow in the left vertebral artery not entirely explained by tortuous course. This vessel does appear patent on T2 weighted imaging flow voids. There is a patent right vertebral artery the proximal basilar. Very poor signal throughout the basilar. Although there are fetal type bilateral posterior cerebral arteries mid basilar has lost its flow void on T2 weighted imaging, new from 01/11/2014 brain MRI (no previous angiography). Symmetric carotid arteries. Atheromatous type irregularity of the bilateral carotid siphons. No flow limiting stenosis in the anterior circulation. No branch occlusion. Negative for aneurysm. Bulbous appearance of the proximal right pica is attributed to tortuous vessel. These results were called by telephone at the time of interpretation on 07/14/2017 at 10:21 am to Dr. Aline August , who verbally acknowledged these results. IMPRESSION: Brain MRI: 1. No acute finding including infarct. 2. Mild chronic small vessel ischemia. 3. Known left parietal meningioma with mild cortical mass effect, essentially stable from 2015. Intracranial MRA: Poor signal in the left vertebral and basilar arteries that appears new from 2015. If basilar thrombosis is a clinical possibility suggest CTA head and neck. Electronically Signed   By: Monte Fantasia M.D.   On: 07/14/2017 10:24        Scheduled Meds: .  stroke: mapping our early stages of recovery book   Does not apply Once  . aspirin EC  81 mg Oral Daily  . calcium acetate  2,001 mg Oral TID WC  . carvedilol  25 mg Oral BID  . doxercalciferol  4 mcg Intravenous Q T,Th,Sa-HD  . isosorbide mononitrate  30 mg Oral Daily  . multivitamin  1 tablet Oral QHS  . polyethylene  glycol  17 g Oral Daily   Continuous Infusions:   LOS: 1 day        Aline August, MD [facility] Pager [phone]  If 7PM-7AM, please contact  night-coverage [email or web address] Password [facility] 07/15/2017, 10:53 AM

## 2017-07-15 NOTE — Evaluation (Signed)
Physical Therapy Evaluation Patient Details Name: Kathleen [name] MRN: [id number] DOB: 05-Jan-1946 Today's Date: 07/15/2017   History of Present Illness  71 year old female with history of hypertension, hyperlipidemia, diabetes mellitus type 2, end-stage renal disease on hemodialysis and coronary artery disease presented with altered mental status and elevated blood pressure.  MRI of the brain was negative for acute stroke.  Clinical Impression  Pt admitted with/for AMS, elevated BP and to r/o Stroke.  Pt presently needing min to mod assist for basic mobility.  Pt currently limited functionally due to the problems listed. ( See problems list.)   Pt will benefit from PT to maximize function and safety in order to get ready for next venue listed below.     Follow Up Recommendations SNF;Supervision/Assistance - 24 hour    Equipment Recommendations  None recommended by PT(TBA)    Recommendations for Other Services       Precautions / Restrictions Precautions Precautions: Fall Restrictions Weight Bearing Restrictions: No      Mobility  Bed Mobility Overal bed mobility: Needs Assistance Bed Mobility: Supine to Sit;Sit to Supine     Supine to sit: Min assist Sit to supine: Min assist   General bed mobility comments: increased time, use of the rail and needed assist to get up onto her L elbow then able to rise and scoot slowly to the EOB  Transfers Overall transfer level: Needs assistance   Transfers: Sit to/from Stand Sit to Stand: Mod assist         General transfer comment: face to face assist forward and up, then side step to [facility].  Ambulation/Gait             General Gait Details: side step to [facility] with face to face light mod assist  Stairs            Wheelchair Mobility    Modified Rankin (Stroke Patients Only)       Balance Overall balance assessment: Needs assistance Sitting-balance support: No upper extremity supported Sitting balance-Leahy  Scale: Good       Standing balance-Leahy Scale: Poor Standing balance comment: reliant on assist                             Pertinent Vitals/Pain Pain Assessment: No/denies pain Faces Pain Scale: Hurts little more Pain Location: IV site Pain Descriptors / Indicators: Guarding    Home Living Family/patient expects to be discharged to:: Private residence Living Arrangements: Children(son lives with her, dtr involved also) Available Help at Discharge: Family;Available PRN/intermittently(son and dtr work) Type of Home: House Home Access: Stairs to enter Entrance Stairs-Rails: None Technical brewer of Steps: 1 Home Layout: One level Home Equipment: Environmental consultant - 2 wheels;Bedside commode;Cane - single point;Shower seat - built in Additional Comments: pt reports son or daughter are normally there or checking in    Prior Function Level of Independence: Independent with assistive device(s)         Comments: uses RW at home, family does the homemaking, pt bathes with daughter present for supervision, dresses independently     Hand Dominance   Dominant Hand: Right    Extremity/Trunk Assessment   Upper Extremity Assessment Upper Extremity Assessment: Defer to OT evaluation    Lower Extremity Assessment Lower Extremity Assessment: Generalized weakness(R stronger than L LE, quads, hams, hip flexor weakness bil)       Communication   Communication: No difficulties  Cognition Arousal/Alertness: Awake/alert Behavior During Therapy:  WFL for tasks assessed/performed Overall Cognitive Status: Impaired/Different from baseline Area of Impairment: Orientation;Attention;Following commands;Safety/judgement;Awareness;Problem solving                 Orientation Level: Situation;Time Current Attention Level: Focused;Sustained   Following Commands: Follows one step commands with increased time;Follows one step commands inconsistently Safety/Judgement: Decreased  awareness of safety;Decreased awareness of deficits Awareness: Intellectual Problem Solving: Slow processing;Decreased initiation;Requires tactile cues        General Comments General comments (skin integrity, edema, etc.): pt became dizzy at EOB withg sitting BP 81/49, then after getting nauseated, returned to supine.  2 lying BP's after in supine 75/48 and 61/34.  with corresponding nausea.  RN notified.    Exercises     Assessment/Plan    PT Assessment Patient needs continued PT services  PT Problem List Decreased strength;Decreased activity tolerance;Decreased balance;Decreased mobility;Decreased cognition;Decreased knowledge of precautions       PT Treatment Interventions DME instruction;Gait training;Functional mobility training;Therapeutic activities;Balance training;Patient/family education    PT Goals (Current goals can be found in the Care Plan section)  Acute Rehab PT Goals Patient Stated Goal: pt unable PT Goal Formulation: Patient unable to participate in goal setting Time For Goal Achievement: 07/29/17 Potential to Achieve Goals: Fair    Frequency Min 3X/week   Barriers to discharge        Co-evaluation               AM-PAC PT "6 Clicks" Daily Activity  Outcome Measure Difficulty turning over in bed (including adjusting bedclothes, sheets and blankets)?: Unable Difficulty moving from lying on back to sitting on the side of the bed? : Unable Difficulty sitting down on and standing up from a chair with arms (e.g., wheelchair, bedside commode, etc,.)?: Unable Help needed moving to and from a bed to chair (including a wheelchair)?: A Little Help needed walking in hospital room?: A Lot Help needed climbing 3-5 steps with a railing? : A Lot 6 Click Score: 10    End of Session   Activity Tolerance: Patient tolerated treatment well Patient left: in bed;with call bell/phone within reach;with bed alarm set Nurse Communication: Mobility status PT Visit  Diagnosis: Unsteadiness on feet (R26.81);Muscle weakness (generalized) (M62.81)    Time: 4008-6761 PT Time Calculation (min) (ACUTE ONLY): 32 min   Charges:   PT Evaluation $PT Eval Moderate Complexity: 1 Mod PT Treatments $Therapeutic Activity: 8-22 mins   PT G Codes:        2017/07/26  Donnella Sham, PT [phone]  (pager)  Tessie Fass Hayla Hinger 07-26-17, 12:35 PM

## 2017-07-15 NOTE — Progress Notes (Signed)
Transferred to dialysis via bed, patient alert and pleasantly confused. Son at bedside.

## 2017-07-15 NOTE — Evaluation (Signed)
Clinical/Bedside Swallow Evaluation Patient Details  Name: Kathleen [name] MRN: [id number] Date of Birth: [birth year]  Today's Date: 07/15/2017 Time: SLP Start Time (ACUTE ONLY): 0847 SLP Stop Time (ACUTE ONLY): 0900 SLP Time Calculation (min) (ACUTE ONLY): 13 min  Past Medical History:  Past Medical History:  Diagnosis Date  . Anemia    a. 01/2014: suspected of chronic disease, negative FOBT.  [name] CAD (coronary artery disease)    a. Cath 01/2014: mild in LAD, mild-mod LCx, mod-severe RCA - for med rx.  . CKD (chronic kidney disease), stage III ([place])    a. Borderline III-IV.  . Diabetes mellitus without complication ([place])   . History of echocardiogram    Echo (10/15):  Mod LVH, EF 50-55%, Gr 1 DD, MAC, mild MR, mild TR, PASP 35 mmHg  . Hyperlipidemia   . Hypertension   . Meningioma ([place])    a. Incidental dx 01/2014.  . Obesity   . Systolic CHF ([place])    a. 02/4258: dx with mixed ICM/NICM (out of proportion to CAD) EF 25-30% by echo.    Past Surgical History:  Past Surgical History:  Procedure Laterality Date  . ABDOMINAL HYSTERECTOMY    . AV FISTULA PLACEMENT Left 02/10/2017   Procedure: ARTERIOVENOUS (AV) FISTULA CREATION-LEFT FOREARM;  Surgeon: Elam Dutch, MD;  Location: [facility];  Service: Vascular;  Laterality: Left;  . BREAST SURGERY    . INSERTION OF DIALYSIS CATHETER Right 02/10/2017   Procedure: INSERTION OF DIALYSIS CATHETER;  Surgeon: Elam Dutch, MD;  Location: [place];  Service: Vascular;  Laterality: Right;  . LEFT HEART CATHETERIZATION WITH CORONARY ANGIOGRAM N/A 01/27/2014   Procedure: LEFT HEART CATHETERIZATION WITH CORONARY ANGIOGRAM;  Surgeon: Jettie Booze, MD;  Location: [facility] CATH LAB;  Service: Cardiovascular;  Laterality: N/A;  . TONSILLECTOMY     HPI:  Kathleen [name] a 71 y.o.female, hx ESRD, recent left arm AV fisutula infiltration, CAD, hyperlipidemia,who presented to the ER 07/13/17 with altered mental status. She had non focal neuro exam. Had  systolic blood pressure up to 343m Hg. Work up with head CT showed no stroke. MRI showed no stroke. CTA neck showed no internal carotid artery stenosis, left vertebral occlusion, left subclavian stenosis, proximal basilar stenosis, no right vertebral stenosis.   Assessment / Plan / Recommendation Clinical Impression  Pt's greatest risk for aspiration at this time is cognitive impairment/altered mental status. She is alert, intermittently cooperative with cues. She is oriented to self and location but not situation or time; she is significantly confused and verbally agitated. For limited assessment, oropharyngeal swallow appears functional with adequate airway protection. Oral motor examination is unremarkable. Recommend initiating full liquids, meds can be given whole with liquid or whole or crushed in puree (may be beneficial to give pt a choice to promote acceptance). SLP will follow for advancement of solids as her mentation improves.  SLP Visit Diagnosis: Dysphagia, unspecified (R13.10)    Aspiration Risk  Mild aspiration risk;Moderate aspiration risk    Diet Recommendation Thin liquid   Liquid Administration via: Cup;Straw Medication Administration: (pt preference) Supervision: Patient able to self feed;Full supervision/cueing for compensatory strategies Compensations: Slow rate;Small sips/bites Postural Changes: Seated upright at 90 degrees    Other  Recommendations Oral Care Recommendations: Oral care BID   Follow up Recommendations Other (comment)(tbd)      Frequency and Duration min 2x/week  2 weeks       Prognosis Prognosis for Safe Diet Advancement: Good Barriers to Reach Goals: Cognitive  deficits;Behavior      Swallow Study   General Date of Onset: 07/13/17 HPI: Kathleen [name] a 71 y.o.female, hx ESRD, recent left arm AV fisutula infiltration, CAD, hyperlipidemia,who presented to the ER 07/13/17 with altered mental status. She had non focal neuro exam. Had  systolic blood pressure up to 374m Hg. Work up with head CT showed no stroke. MRI showed no stroke. CTA neck showed no internal carotid artery stenosis, left vertebral occlusion, left subclavian stenosis, proximal basilar stenosis, no right vertebral stenosis. Type of Study: Bedside Swallow Evaluation Previous Swallow Assessment: none in chart Diet Prior to this Study: NPO Temperature Spikes Noted: (99) Respiratory Status: Room air History of Recent Intubation: No Behavior/Cognition: Alert;Confused;Requires cueing;Distractible Oral Cavity Assessment: Within Functional Limits Oral Care Completed by SLP: No Oral Cavity - Dentition: Adequate natural dentition Vision: Functional for self-feeding Self-Feeding Abilities: Able to feed self Patient Positioning: Upright in bed Baseline Vocal Quality: Normal Volitional Cough: Strong Volitional Swallow: Able to elicit    Oral/Motor/Sensory Function Overall Oral Motor/Sensory Function: Within functional limits   Ice Chips Ice chips: Within functional limits Presentation: Spoon   Thin Liquid Thin Liquid: Within functional limits Presentation: Cup;Straw Other Comments: limited assessment    Nectar Thick Nectar Thick Liquid: Not tested   Honey Thick Honey Thick Liquid: Not tested   Puree Puree: Not tested Other Comments: pt refused   Solid   GO   Solid: Within functional limits Presentation: Self Fed Other Comments: limited assessment; pt took single bite        MAliene Altes12/03/2017,9:22 AM  MDeneise Lever MPhiladelphia COaksPathologist [phone]

## 2017-07-16 LAB — ECHOCARDIOGRAM COMPLETE
AVLVOTPG: 6 mmHg
CHL CUP DOP CALC LVOT VTI: 21.7 cm
CHL CUP MV DEC (S): 280
E/e' ratio: 12.67
EWDT: 280 ms
FS: 27 % — AB (ref 28–44)
Height: 63 in
IV/PV OW: 1.05
LA diam index: 1.73 cm/m2
LA vol A4C: 45.1 ml
LA vol index: 26.5 mL/m2
LASIZE: 32 mm
LAVOL: 49.1 mL
LEFT ATRIUM END SYS DIAM: 32 mm
LV E/e' medial: 12.67
LV PW d: 9.62 mm — AB (ref 0.6–1.1)
LV e' LATERAL: 5.51 cm/s
LVEEAVG: 12.67
LVOT SV: 62 mL
LVOT area: 2.84 cm2
LVOT diameter: 19 mm
LVOTPV: 125 cm/s
MV pk E vel: 69.8 m/s
MVPKAVEL: 128 m/s
RV LATERAL S' VELOCITY: 11.3 cm/s
TAPSE: 17.9 mm
TDI e' lateral: 5.51
TDI e' medial: 3.21
Weight: 2878.33 oz

## 2017-07-16 LAB — CBC WITH DIFFERENTIAL/PLATELET
BASOS ABS: 0 10*3/uL (ref 0.0–0.1)
BASOS PCT: 0 %
Eosinophils Absolute: 0.1 10*3/uL (ref 0.0–0.7)
Eosinophils Relative: 3 %
HEMATOCRIT: 39.7 % (ref 36.0–46.0)
HEMOGLOBIN: 12.7 g/dL (ref 12.0–15.0)
LYMPHS PCT: 29 %
Lymphs Abs: 1.4 10*3/uL (ref 0.7–4.0)
MCH: 30.7 pg (ref 26.0–34.0)
MCHC: 32 g/dL (ref 30.0–36.0)
MCV: 95.9 fL (ref 78.0–100.0)
MONOS PCT: 15 %
Monocytes Absolute: 0.7 10*3/uL (ref 0.1–1.0)
NEUTROS ABS: 2.6 10*3/uL (ref 1.7–7.7)
NEUTROS PCT: 53 %
Platelets: 189 10*3/uL (ref 150–400)
RBC: 4.14 MIL/uL (ref 3.87–5.11)
RDW: 16.5 % — ABNORMAL HIGH (ref 11.5–15.5)
WBC: 4.8 10*3/uL (ref 4.0–10.5)

## 2017-07-16 LAB — BASIC METABOLIC PANEL
ANION GAP: 13 (ref 5–15)
BUN: 14 mg/dL (ref 6–20)
CHLORIDE: 101 mmol/L (ref 101–111)
CO2: 24 mmol/L (ref 22–32)
Calcium: 8.4 mg/dL — ABNORMAL LOW (ref 8.9–10.3)
Creatinine, Ser: 3.17 mg/dL — ABNORMAL HIGH (ref 0.44–1.00)
GFR calc non Af Amer: 14 mL/min — ABNORMAL LOW (ref >60)
GFR, EST AFRICAN AMERICAN: 16 mL/min — AB (ref >60)
Glucose, Bld: 166 mg/dL — ABNORMAL HIGH (ref 65–99)
Potassium: 4 mmol/L (ref 3.5–5.1)
Sodium: 138 mmol/L (ref 135–145)

## 2017-07-16 LAB — GLUCOSE, CAPILLARY
Glucose-Capillary: 356 mg/dL — ABNORMAL HIGH (ref 65–99)
Glucose-Capillary: 197 mg/dL — ABNORMAL HIGH (ref 65–99)

## 2017-07-16 LAB — MAGNESIUM: Magnesium: 2 mg/dL (ref 1.7–2.4)

## 2017-07-16 MED ORDER — INSULIN ASPART 100 UNIT/ML ~~LOC~~ SOLN: 0.0000 [IU] | Freq: Every day | SUBCUTANEOUS | Status: DC

## 2017-07-16 MED ORDER — INSULIN ASPART 100 UNIT/ML ~~LOC~~ SOLN
0.0000 [IU] | Freq: Three times a day (TID) | SUBCUTANEOUS | Status: DC
  Administered 2017-07-16: 9 [IU] via SUBCUTANEOUS
  Administered 2017-07-17: 2 [IU] via SUBCUTANEOUS
  Administered 2017-07-17: 3 [IU] via SUBCUTANEOUS
  Administered 2017-07-17 – 2017-07-18 (×2): 2 [IU] via SUBCUTANEOUS
  Administered 2017-07-19: 3 [IU] via SUBCUTANEOUS

## 2017-07-16 NOTE — Progress Notes (Signed)
Patient ID: Kathleen [name], female   DOB: 07/24/1946, 71 y.o.   MRN: [id number]  PROGRESS NOTE    Kathleen [name]  GQQ:[id number] DOB: 1946-07-25 DOA: 07/13/2017 PCP: Benito Mccreedy, MD   Brief Narrative:  71 year old female with history of hypertension, hyperlipidemia, diabetes mellitus type 2, end-stage renal disease on hemodialysis and coronary artery disease presented with altered mental status and elevated blood pressure.  MRI of the brain was negative for acute stroke.  Nephrology was consulted for continuation of dialysis.  Vascular surgery was consulted for abnormal findings on MRA of the head and neck. Assessment & Plan:   Active Problems:   Altered mental status  Acute encephalopathy/altered mental status -Questionable cause -Mental status has much improved -MRI of the brain was negative for acute stroke -Continue monitoring mental status -Diet as per SLP recommendations - Continue PT evaluation  Question of right-sided weakness -MRI of the brain is negative for acute stroke - PT eval  Left vertebral artery occlusion along with stenosis of left subclavian artery -Vascular surgery consult appreciated.  No surgical interventions recommended at this time. -Altered mental status probably not because of these arterial occlusions as per vascular surgery  End-stage renal disease on hemodialysis -Continue dialysis as per nephrology schedule  Diabetes mellitus type 2 -Continue Accu-Cheks with coverage  Hypertension -Monitor blood pressure.  Continue isosorbide mononitrate and Coreg  Generalized deconditioning -PT recommends nursing home placement.  Social worker consult    DVT prophylaxis: Heparin Code Status: DNR Family Communication: None at bedside.  Had spoken to patient's son on phone on 07/15/2017 Disposition Plan: Probable nursing home placement in 1-2 days  Consultants: Nephrology and vascular surgery  Procedures:  Echo on 07/15/2017 Study  Conclusions  - Left ventricle: The cavity size was normal. Wall thickness was   increased in a pattern of mild LVH. Systolic function was normal.   The estimated ejection fraction was in the range of 60% to 65%.   Wall motion was normal; there were no regional wall motion   abnormalities. Doppler parameters are consistent with abnormal   left ventricular relaxation (grade 1 diastolic dysfunction).   Doppler parameters are consistent with high ventricular filling   pressure. - Aortic valve: Valve area (VTI): 2.13 cm^2. Valve area (Vmax):   2.15 cm^2. Valve area (Vmean): 2.01 cm^2.  Antimicrobials: None  Subjective: Patient seen and examined at bedside.  She is much more awake today and answers some questions, does not remember what month this is.  No overnight fever or vomiting.  She is tolerating diet.  Objective: Vitals:   07/15/17 2230 07/15/17 2254 07/16/17 0200 07/16/17 0536  BP: (!) 122/59 (!) 148/61 127/69 (!) 118/54  Pulse: 83 82  80  Resp:  18  18  Temp:  97.6 F (36.4 C) 98.7 F (37.1 C) 98.1 F (36.7 C)  TempSrc:  Oral Oral Oral  SpO2:  95% 98% 92%  Weight:  77.3 kg (170 lb 6.7 oz)    Height:        Intake/Output Summary (Last 24 hours) at 07/16/2017 1108 Last data filed at 07/15/2017 2254 Gross per 24 hour  Intake -  Output 525 ml  Net -525 ml   Filed Weights   07/15/17 0941 07/15/17 1945 07/15/17 2254  Weight: 81.6 kg (179 lb 14.3 oz) 78 kg (171 lb 15.3 oz) 77.3 kg (170 lb 6.7 oz)    Examination:  General exam: Appears calm and comfortable; awake and answers some questions but does not remember this  month still Respiratory system: Bilateral decreased breath sound at bases Cardiovascular system: S1 & S2 heard, rate controlled  gastrointestinal system: Abdomen is nondistended, soft and nontender. Normal bowel sounds heard. Extremities: No cyanosis, clubbing, edema    Data Reviewed: I have personally reviewed following labs and imaging  studies  CBC: Recent Labs  Lab 07/13/17 2335 07/14/17 1041 07/16/17 0243  WBC 4.8 4.7 4.8  NEUTROABS 2.5  --  2.6  HGB 13.3 13.6 12.7  HCT 42.0 42.6 39.7  MCV 95.0 94.9 95.9  PLT 220 234 448   Basic Metabolic Panel: Recent Labs  Lab 07/13/17 2335 07/14/17 1041 07/16/17 0243  NA 136 136 138  K 4.7 5.6* 4.0  CL 95* 99* 101  CO2 25 21* 24  GLUCOSE 121* 119* 166*  BUN 46* 53* 14  CREATININE 5.82* 6.17* 3.17*  CALCIUM 7.8* 7.8* 8.4*  MG  --   --  2.0   GFR: Estimated Creatinine Clearance: 15.8 mL/min (A) (by C-G formula based on SCr of 3.17 mg/dL (H)). Liver Function Tests: Recent Labs  Lab 07/13/17 2335 07/14/17 1041  AST 20 21  ALT 10* 11*  ALKPHOS 146* 141*  BILITOT 0.8 0.5  PROT 6.7 6.4*  ALBUMIN 3.5 3.3*   No results for input(s): LIPASE, AMYLASE in the last 168 hours. No results for input(s): AMMONIA in the last 168 hours. Coagulation Profile: No results for input(s): INR, PROTIME in the last 168 hours. Cardiac Enzymes: No results for input(s): CKTOTAL, CKMB, CKMBINDEX, TROPONINI in the last 168 hours. BNP (last 3 results) No results for input(s): PROBNP in the last 8760 hours. HbA1C: Recent Labs    07/14/17 1041  HGBA1C 7.4*   CBG: Recent Labs  Lab 07/14/17 0014  GLUCAP 118*   Lipid Profile: Recent Labs    07/14/17 1041  CHOL 267*  HDL 57  LDLCALC 186*  TRIG 121  CHOLHDL 4.7   Thyroid Function Tests: No results for input(s): TSH, T4TOTAL, FREET4, T3FREE, THYROIDAB in the last 72 hours. Anemia Panel: No results for input(s): VITAMINB12, FOLATE, FERRITIN, TIBC, IRON, RETICCTPCT in the last 72 hours. Sepsis Labs: No results for input(s): PROCALCITON, LATICACIDVEN in the last 168 hours.  Recent Results (from the past 240 hour(s))  MRSA PCR Screening     Status: None   Collection Time: 07/14/17  7:03 PM  Result Value Ref Range Status   MRSA by PCR NEGATIVE NEGATIVE Final    Comment:        The GeneXpert MRSA Assay (FDA approved for  NASAL specimens only), is one component of a comprehensive MRSA colonization surveillance program. It is not intended to diagnose MRSA infection nor to guide or monitor treatment for MRSA infections.          Radiology Studies: Ct Angio Head W Or Wo Contrast  Result Date: 07/14/2017 CLINICAL DATA:  Right-sided weakness. Abnormal MRA with decreased signal in the left vertebral artery and basilar artery. Possible stricture or occlusion. Add EXAM: CT ANGIOGRAPHY HEAD AND NECK TECHNIQUE: Multidetector CT imaging of the head and neck was performed using the standard protocol during bolus administration of intravenous contrast. Multiplanar CT image reconstructions and MIPs were obtained to evaluate the vascular anatomy. Carotid stenosis measurements (when applicable) are obtained utilizing NASCET criteria, using the distal internal carotid diameter as the denominator. CONTRAST:  75mL ISOVUE-370 IOPAMIDOL (ISOVUE-370) INJECTION 76% COMPARISON:  None. FINDINGS: CT HEAD FINDINGS Brain: No acute infarct, hemorrhage, or mass lesion is present. The ventricles are of normal size.  No subarachnoid hemorrhage is present. The left parasagittal parietal meningioma is again noted. Vascular: Atherosclerotic calcifications are present in the cavernous internal carotid artery is bilaterally. There is no hyperdense vessel. Skull: The calvarium is intact. No focal lytic or blastic lesion is present. Sinuses: The paranasal sinuses and mastoid air cells are clear. Orbits: Globes and orbits are within normal limits. Bilateral lens replacements are noted. Review of the MIP images confirms the above findings CTA NECK FINDINGS Aortic arch: There is a common origin of the left common carotid artery and the innominate artery. Atherosclerotic calcifications are present at the origins the great vessels without significant origins stenoses. Calcified and noncalcified plaque narrows the left subclavian artery to 1.8 mm. This compares  with a more distal measurement of 6.5 mm. Right carotid system: The right common carotid artery is tortuous. There is some atherosclerotic change distally without significant stenosis. Calcified and noncalcified plaque is present at the right carotid origin without a significant stenosis relative to the more distal vessels. There is tortuosity of the cervical right ICA. Left carotid system: The left common carotid artery demonstrates mild distal narrowing of less than 50%. Calcified and noncalcified plaque is present at the left carotid bifurcation. There is no significant stenosis relative to the more distal vessel. Moderate proximal tortuosity is present in the cervical left ICA without significant stenosis. Vertebral arteries: The right vertebral artery originates from the subclavian artery without significant stenosis. There is proximal tortuosity. The left vertebral artery is occluded proximally with dense calcification at its origin. The slightly more distal thyrocervical trunk is intact. There is faint contrast in the cervical left vertebral artery as low as C6. Contrast is better seen in the more distal left vertebral artery, particularly above the C2-3 junction the vertebral arteries are codominant at the dural margin. Skeleton: Multilevel endplate degenerative changes are most pronounced at C5-6 and C6-7. There is reversal of the normal cervical lordosis. Other neck: A right IJ dialysis catheter is in place. Soft tissues the neck are otherwise unremarkable. No focal mucosal or submucosal lesions are present. Parotid and submandibular glands are within normal limits. Upper chest: The lung apices are clear. The superior mediastinum is within normal limits. Review of the MIP images confirms the above findings CTA HEAD FINDINGS Anterior circulation: Atherosclerotic calcifications are present in the cavernous internal carotid artery is bilaterally. There is mild narrowing of less than 50% involving the  paraophthalmic right ICA and precavernous left ICA. Mild narrowing of the terminal left ICA is less than 50%. Mild irregularity is present in the distal M1 segment without significant stenosis. The A1 and M1 segments are otherwise within normal limits. MCA bifurcations are intact. There is mild irregularity in the ACA and MCA branch vessels distally without significant proximal stenosis or aneurysm. There is no significant stenosis through the ICA terminus on either side. Posterior circulation: The vertebral arteries are codominant at the dural margin. PICA origins are visualized and normal. There is moderate to severe stenosis of the proximal basilar artery with reconstituted hypoplastic caliber more distally. The superior cerebellar artery is are intact. A small P1 segment is present on the right. There is mild narrowing of proximal P2 segments bilaterally. Venous sinuses: The dural sinuses are patent. Cortical veins are within normal limits. Anatomic variants: Fetal type posterior cerebral artery is bilaterally. Delayed phase: The postcontrast images demonstrate no pathologic enhancement. Review of the MIP images confirms the above findings IMPRESSION: 1. Occluded proximal left vertebral artery with reconstituted flow at the level  of C6. This may be retrograde. 2. Hypoplastic distal vertebral artery is not basilar artery with fetal type posterior cerebral arteries bilaterally. 3. Mild narrowing of the distal common internal carotid artery is and at the carotid bifurcations bilaterally without significant stenosis. 4. Mild narrowing within the cavernous internal carotid artery is bilaterally without significant stenosis relative to the more distal vessels. 5. Moderate to high-grade stenosis of the proximal vertebral artery may be flow limiting given the signal loss on MRI. 6. High-grade stenosis of the left subclavian artery proximal to the left vertebral artery. Electronically Signed   By: San Morelle  M.D.   On: 07/14/2017 13:28   Ct Angio Neck W Or Wo Contrast  Result Date: 07/14/2017 CLINICAL DATA:  Right-sided weakness. Abnormal MRA with decreased signal in the left vertebral artery and basilar artery. Possible stricture or occlusion. Add EXAM: CT ANGIOGRAPHY HEAD AND NECK TECHNIQUE: Multidetector CT imaging of the head and neck was performed using the standard protocol during bolus administration of intravenous contrast. Multiplanar CT image reconstructions and MIPs were obtained to evaluate the vascular anatomy. Carotid stenosis measurements (when applicable) are obtained utilizing NASCET criteria, using the distal internal carotid diameter as the denominator. CONTRAST:  65mL ISOVUE-370 IOPAMIDOL (ISOVUE-370) INJECTION 76% COMPARISON:  None. FINDINGS: CT HEAD FINDINGS Brain: No acute infarct, hemorrhage, or mass lesion is present. The ventricles are of normal size. No subarachnoid hemorrhage is present. The left parasagittal parietal meningioma is again noted. Vascular: Atherosclerotic calcifications are present in the cavernous internal carotid artery is bilaterally. There is no hyperdense vessel. Skull: The calvarium is intact. No focal lytic or blastic lesion is present. Sinuses: The paranasal sinuses and mastoid air cells are clear. Orbits: Globes and orbits are within normal limits. Bilateral lens replacements are noted. Review of the MIP images confirms the above findings CTA NECK FINDINGS Aortic arch: There is a common origin of the left common carotid artery and the innominate artery. Atherosclerotic calcifications are present at the origins the great vessels without significant origins stenoses. Calcified and noncalcified plaque narrows the left subclavian artery to 1.8 mm. This compares with a more distal measurement of 6.5 mm. Right carotid system: The right common carotid artery is tortuous. There is some atherosclerotic change distally without significant stenosis. Calcified and noncalcified  plaque is present at the right carotid origin without a significant stenosis relative to the more distal vessels. There is tortuosity of the cervical right ICA. Left carotid system: The left common carotid artery demonstrates mild distal narrowing of less than 50%. Calcified and noncalcified plaque is present at the left carotid bifurcation. There is no significant stenosis relative to the more distal vessel. Moderate proximal tortuosity is present in the cervical left ICA without significant stenosis. Vertebral arteries: The right vertebral artery originates from the subclavian artery without significant stenosis. There is proximal tortuosity. The left vertebral artery is occluded proximally with dense calcification at its origin. The slightly more distal thyrocervical trunk is intact. There is faint contrast in the cervical left vertebral artery as low as C6. Contrast is better seen in the more distal left vertebral artery, particularly above the C2-3 junction the vertebral arteries are codominant at the dural margin. Skeleton: Multilevel endplate degenerative changes are most pronounced at C5-6 and C6-7. There is reversal of the normal cervical lordosis. Other neck: A right IJ dialysis catheter is in place. Soft tissues the neck are otherwise unremarkable. No focal mucosal or submucosal lesions are present. Parotid and submandibular glands are within normal limits.  Upper chest: The lung apices are clear. The superior mediastinum is within normal limits. Review of the MIP images confirms the above findings CTA HEAD FINDINGS Anterior circulation: Atherosclerotic calcifications are present in the cavernous internal carotid artery is bilaterally. There is mild narrowing of less than 50% involving the paraophthalmic right ICA and precavernous left ICA. Mild narrowing of the terminal left ICA is less than 50%. Mild irregularity is present in the distal M1 segment without significant stenosis. The A1 and M1 segments are  otherwise within normal limits. MCA bifurcations are intact. There is mild irregularity in the ACA and MCA branch vessels distally without significant proximal stenosis or aneurysm. There is no significant stenosis through the ICA terminus on either side. Posterior circulation: The vertebral arteries are codominant at the dural margin. PICA origins are visualized and normal. There is moderate to severe stenosis of the proximal basilar artery with reconstituted hypoplastic caliber more distally. The superior cerebellar artery is are intact. A small P1 segment is present on the right. There is mild narrowing of proximal P2 segments bilaterally. Venous sinuses: The dural sinuses are patent. Cortical veins are within normal limits. Anatomic variants: Fetal type posterior cerebral artery is bilaterally. Delayed phase: The postcontrast images demonstrate no pathologic enhancement. Review of the MIP images confirms the above findings IMPRESSION: 1. Occluded proximal left vertebral artery with reconstituted flow at the level of C6. This may be retrograde. 2. Hypoplastic distal vertebral artery is not basilar artery with fetal type posterior cerebral arteries bilaterally. 3. Mild narrowing of the distal common internal carotid artery is and at the carotid bifurcations bilaterally without significant stenosis. 4. Mild narrowing within the cavernous internal carotid artery is bilaterally without significant stenosis relative to the more distal vessels. 5. Moderate to high-grade stenosis of the proximal vertebral artery may be flow limiting given the signal loss on MRI. 6. High-grade stenosis of the left subclavian artery proximal to the left vertebral artery. Electronically Signed   By: San Morelle M.D.   On: 07/14/2017 13:28        Scheduled Meds: .  stroke: mapping our early stages of recovery book   Does not apply Once  . aspirin EC  81 mg Oral Daily  . atorvastatin  40 mg Oral q1800  . calcium acetate   2,001 mg Oral TID WC  . carvedilol  25 mg Oral BID  . doxercalciferol  4 mcg Intravenous Q T,Th,Sa-HD  . heparin  3,000 Units Dialysis Once in dialysis  . isosorbide mononitrate  30 mg Oral Daily  . multivitamin  1 tablet Oral QHS  . polyethylene glycol  17 g Oral Daily   Continuous Infusions: . sodium chloride    . sodium chloride       LOS: 2 days        Aline August, MD [facility] Pager [phone]  If 7PM-7AM, please contact night-coverage [email or web address] Password [email or web address] 07/16/2017, 11:08 AM

## 2017-07-16 NOTE — Plan of Care (Signed)
Patient alert and oriented x3, tolerated meds whole, vitals stable.

## 2017-07-16 NOTE — Progress Notes (Signed)
  Speech Language Pathology Treatment: Dysphagia  Patient Details Name: Kathleen [name] MRN: [id number] DOB: 05-07-1946 Today's Date: 07/16/2017 Time: 1430-1450 SLP Time Calculation (min) (ACUTE ONLY): 20 min  Assessment / Plan / Recommendation Clinical Impression  Pt seen for follow-up for dysphagia. She is alert and oriented x4, following all commands, with no agitation and confusion appears to be resolving. Self feeds regular solids independently with no overt signs of aspiration. Good sustained attention to PO, oral preparation and clearance adequate for solids. Will upgrade to regular diet with thin liquids, medications whole with liquid. No further skilled ST follow-up recommended at this time. Will s/o.    HPI HPI: Liesl Simons McLeanis a 71 y.o.female, hx ESRD, recent left arm AV fisutula infiltration, CAD, hyperlipidemia,who presented to the ER 07/13/17 with altered mental status. She had non focal neuro exam. Had systolic blood pressure up to 368m Hg. Work up with head CT showed no stroke. MRI showed no stroke. CTA neck showed no internal carotid artery stenosis, left vertebral occlusion, left subclavian stenosis, proximal basilar stenosis, no right vertebral stenosis.      SLP Plan  Discharge SLP treatment due to (comment)(goals met)       Recommendations  Diet recommendations: Regular;Thin liquid Liquids provided via: Cup;Straw Medication Administration: Whole meds with liquid Supervision: Patient able to self feed Compensations: Slow rate;Small sips/bites                Oral Care Recommendations: Oral care BID Follow up Recommendations: Skilled Nursing facility SLP Visit Diagnosis: Dysphagia, unspecified (R13.10) Plan: Discharge SLP treatment due to (comment)(goals met)       GNemaha MFort Yates CRinconSpeech-Language Pathologist [phone] MAliene Altes12/04/2027, 2:59 PM

## 2017-07-16 NOTE — Progress Notes (Addendum)
[facility] Progress Note  Subjective: looks much better  Vitals:   07/15/17 2230 07/15/17 2254 07/16/17 0200 07/16/17 0536  BP: (!) 122/59 (!) 148/61 127/69 (!) 118/54  Pulse: 83 82  80  Resp:  18  18  Temp:  97.6 F (36.4 C) 98.7 F (37.1 C) 98.1 F (36.7 C)  TempSrc:  Oral Oral Oral  SpO2:  95% 98% 92%  Weight:  77.3 kg (170 lb 6.7 oz)    Height:        Inpatient medications: .  stroke: mapping our early stages of recovery book   Does not apply Once  . aspirin EC  81 mg Oral Daily  . atorvastatin  40 mg Oral q1800  . calcium acetate  2,001 mg Oral TID WC  . carvedilol  25 mg Oral BID  . doxercalciferol  4 mcg Intravenous Q T,Th,Sa-HD  . heparin  3,000 Units Dialysis Once in dialysis  . insulin aspart  0-5 Units Subcutaneous QHS  . insulin aspart  0-9 Units Subcutaneous TID WC  . isosorbide mononitrate  30 mg Oral Daily  . multivitamin  1 tablet Oral QHS  . polyethylene glycol  17 g Oral Daily   . sodium chloride    . sodium chloride     sodium chloride, sodium chloride, acetaminophen **OR** acetaminophen (TYLENOL) oral liquid 160 mg/5 mL **OR** acetaminophen, alteplase, brimonidine **AND** timolol, heparin, hydrALAZINE, lidocaine (PF), lidocaine-prilocaine, pentafluoroprop-tetrafluoroeth  Exam: General: WDWN elderly female lying in bed with eyes closed NAD  Head: NCAT sclera not icteric MMM Neck: Supple. No JVD No masses Lungs: CTA bilaterally without wheezes, rales, or rhonchi. Breathing is unlabored. Heart: RRR with S1 S2 Abdomen: soft NT + BS Lower extremities:without edema or ischemic changes, no open wounds  Neuro:  Follows commands (opens eyes, moves extremities)  Psych:  Responds to with nodding, nonverbal responses Dialysis Access: RIJ TDC , L AVF+ bruit     Dialysis: [facility] 4h    79kg    2/2.25 bath  Hep 3000    RIJ TDC/ L AVF -hect 4 ug -mircera 50 ug due 12/11 -venofer 50 q wk, start 12/ 11 -labs > Hb 11, tsat 34%,  Ca 8.5/ P  7, pth 862      Impression: 1. AMS - much better.  Has Baclofen listed on her home med lists, this is contraindicated in ESRD and causes delirium.  Will list as intolerance / contraindication in her chart.  2. ESRD - gets HD TTS, stable 3. Vol - stable volume, under dry wt slightly 4. HTN - BP's normal, on coreg only 5. CAD 6. DM2 7. Dispo - going to SNF due to debility  Plan - as above   [name] MD [place] pager [phone]   07/16/2017, 1:57 PM   Recent Labs  Lab 07/13/17 2335 07/14/17 1041 07/16/17 0243  NA 136 136 138  K 4.7 5.6* 4.0  CL 95* 99* 101  CO2 25 21* 24  GLUCOSE 121* 119* 166*  BUN 46* 53* 14  CREATININE 5.82* 6.17* 3.17*  CALCIUM 7.8* 7.8* 8.4*   Recent Labs  Lab 07/13/17 2335 07/14/17 1041  AST 20 21  ALT 10* 11*  ALKPHOS 146* 141*  BILITOT 0.8 0.5  PROT 6.7 6.4*  ALBUMIN 3.5 3.3*   Recent Labs  Lab 07/13/17 2335 07/14/17 1041 07/16/17 0243  WBC 4.8 4.7 4.8  NEUTROABS 2.5  --  2.6  HGB 13.3 13.6 12.7  HCT 42.0 42.6  39.7  MCV 95.0 94.9 95.9  PLT 220 234 189   Iron/TIBC/Ferritin/ %Sat    Component Value Date/Time   IRON 30 02/14/2017 1030   TIBC 216 (L) 02/14/2017 1030   FERRITIN 101 02/14/2017 1030   IRONPCTSAT 14 02/14/2017 1030

## 2017-07-16 NOTE — Plan of Care (Signed)
  Progressing Safety: Ability to remain free from injury will improve 07/16/2017 0426 - Progressing by Verne Grain, RN Skin Integrity: Risk for impaired skin integrity will decrease 07/16/2017 0426 - Progressing by Verne Grain, RN

## 2017-07-17 LAB — BASIC METABOLIC PANEL
Anion gap: 12 (ref 5–15)
BUN: 35 mg/dL — AB (ref 6–20)
CO2: 25 mmol/L (ref 22–32)
CREATININE: 4.49 mg/dL — AB (ref 0.44–1.00)
Calcium: 8.6 mg/dL — ABNORMAL LOW (ref 8.9–10.3)
Chloride: 96 mmol/L — ABNORMAL LOW (ref 101–111)
GFR calc Af Amer: 10 mL/min — ABNORMAL LOW (ref >60)
GFR, EST NON AFRICAN AMERICAN: 9 mL/min — AB (ref >60)
GLUCOSE: 169 mg/dL — AB (ref 65–99)
Potassium: 4.9 mmol/L (ref 3.5–5.1)
SODIUM: 133 mmol/L — AB (ref 135–145)

## 2017-07-17 LAB — CBC WITH DIFFERENTIAL/PLATELET
Basophils Absolute: 0 10*3/uL (ref 0.0–0.1)
Basophils Relative: 0 %
EOS ABS: 0.4 10*3/uL (ref 0.0–0.7)
EOS PCT: 7 %
HCT: 41 % (ref 36.0–46.0)
Hemoglobin: 12.4 g/dL (ref 12.0–15.0)
LYMPHS ABS: 2.1 10*3/uL (ref 0.7–4.0)
LYMPHS PCT: 38 %
MCH: 29.3 pg (ref 26.0–34.0)
MCHC: 30.2 g/dL (ref 30.0–36.0)
MCV: 96.9 fL (ref 78.0–100.0)
MONO ABS: 0.8 10*3/uL (ref 0.1–1.0)
Monocytes Relative: 14 %
Neutro Abs: 2.3 10*3/uL (ref 1.7–7.7)
Neutrophils Relative %: 41 %
PLATELETS: 185 10*3/uL (ref 150–400)
RBC: 4.23 MIL/uL (ref 3.87–5.11)
RDW: 16.4 % — ABNORMAL HIGH (ref 11.5–15.5)
WBC: 5.5 10*3/uL (ref 4.0–10.5)

## 2017-07-17 LAB — GLUCOSE, CAPILLARY
GLUCOSE-CAPILLARY: 277 mg/dL — AB (ref 65–99)
Glucose-Capillary: 100 mg/dL — ABNORMAL HIGH (ref 65–99)
Glucose-Capillary: 167 mg/dL — ABNORMAL HIGH (ref 65–99)
Glucose-Capillary: 166 mg/dL — ABNORMAL HIGH (ref 65–99)
Glucose-Capillary: 183 mg/dL — ABNORMAL HIGH (ref 65–99)

## 2017-07-17 LAB — MAGNESIUM: MAGNESIUM: 2.3 mg/dL (ref 1.7–2.4)

## 2017-07-17 MED ORDER — DIPHENHYDRAMINE HCL 25 MG PO CAPS
25.0000 mg | ORAL_CAPSULE | Freq: Once | ORAL | Status: AC
  Administered 2017-07-17: 25 mg via ORAL
  Filled 2017-07-17: qty 1

## 2017-07-17 NOTE — Progress Notes (Addendum)
Patient ID: Kathleen [name], female   DOB: 03-Jun-1946, 71 y.o.   MRN: [id number]  PROGRESS NOTE    Kathleen [name]  RXV:[id number] DOB: 23-May-1946 DOA: 07/13/2017 PCP: Benito Mccreedy, MD   Brief Narrative:  71 year old female with history of hypertension, hyperlipidemia, diabetes mellitus type 2, end-stage renal disease on hemodialysis and coronary artery disease presented with altered mental status and elevated blood pressure.  MRI of the brain was negative for acute stroke.  Nephrology was consulted for continuation of dialysis.  Vascular surgery was consulted for abnormal findings on MRA of the head and neck.  Assessment & Plan:   Active Problems:   Altered mental status  Acute encephalopathy/altered mental status -Questionable cause.  May be baclofen related.  Baclofen  has been discontinued. -Mental status has much improved -MRI of the brain was negative for acute stroke -Continue monitoring mental status -Diet as per SLP recommendations - Continue PT evaluation  Question of right-sided weakness -Improving.  MRI of the brain is negative for acute stroke - PT eval  Left vertebral artery occlusion along with stenosis of left subclavian artery -Vascular surgery consult appreciated.  No surgical interventions recommended at this time. -Altered mental status probably not because of these arterial occlusions as per vascular surgery  End-stage renal disease on hemodialysis -Continue dialysis as per nephrology schedule  Diabetes mellitus type 2 -Continue Accu-Cheks with coverage  Hypertension -Monitor blood pressure.  Continue isosorbide mononitrate and Coreg  Generalized deconditioning -PT recommends nursing home placement.  Social worker consult    DVT prophylaxis: Heparin Code Status: wants to be full code now. Will change code status to FULL CODE Family Communication: None at bedside.   Disposition Plan: Probable nursing home placement once bed is  available  Consultants: Nephrology and vascular surgery  Procedures:  Echo on 07/15/2017 Study Conclusions  - Left ventricle: The cavity size was normal. Wall thickness was   increased in a pattern of mild LVH. Systolic function was normal.   The estimated ejection fraction was in the range of 60% to 65%.   Wall motion was normal; there were no regional wall motion   abnormalities. Doppler parameters are consistent with abnormal   left ventricular relaxation (grade 1 diastolic dysfunction).   Doppler parameters are consistent with high ventricular filling   pressure. - Aortic valve: Valve area (VTI): 2.13 cm^2. Valve area (Vmax):   2.15 cm^2. Valve area (Vmean): 2.01 cm^2.  Antimicrobials: None  Subjective: Patient seen and examined at bedside.  She is awake and answering questions.  No overnight fever or vomiting.  She is tolerating diet.  Objective: Vitals:   07/16/17 1454 07/16/17 2111 07/17/17 0152 07/17/17 0642  BP: 115/61 112/62 (!) 146/56 (!) 141/60  Pulse: 74 72 66 67  Resp: 18 17 20 16   Temp:  97.6 F (36.4 C) 98.1 F (36.7 C) 97.8 F (36.6 C)  TempSrc:  Oral Oral Oral  SpO2: 98% 100% 95% 97%  Weight:      Height:        Intake/Output Summary (Last 24 hours) at 07/17/2017 1057 Last data filed at 07/17/2017 1017 Gross per 24 hour  Intake 118 ml  Output -  Net 118 ml   Filed Weights   07/15/17 0941 07/15/17 1945 07/15/17 2254  Weight: 81.6 kg (179 lb 14.3 oz) 78 kg (171 lb 15.3 oz) 77.3 kg (170 lb 6.7 oz)    Examination:  General exam: Appears calm and comfortable Respiratory system: Bilateral decreased breath sound at bases  Cardiovascular system: S1 & S2 heard, rate controlled  gastrointestinal system: Abdomen is nondistended, soft and nontender. Normal bowel sounds heard. Extremities: No cyanosis, clubbing, edema    Data Reviewed: I have personally reviewed following labs and imaging studies  CBC: Recent Labs  Lab 07/13/17 2335 07/14/17 1041  07/16/17 0243 07/17/17 0228  WBC 4.8 4.7 4.8 5.5  NEUTROABS 2.5  --  2.6 2.3  HGB 13.3 13.6 12.7 12.4  HCT 42.0 42.6 39.7 41.0  MCV 95.0 94.9 95.9 96.9  PLT 220 234 189 300   Basic Metabolic Panel: Recent Labs  Lab 07/13/17 2335 07/14/17 1041 07/16/17 0243 07/17/17 0228  NA 136 136 138 133*  K 4.7 5.6* 4.0 4.9  CL 95* 99* 101 96*  CO2 25 21* 24 25  GLUCOSE 121* 119* 166* 169*  BUN 46* 53* 14 35*  CREATININE 5.82* 6.17* 3.17* 4.49*  CALCIUM 7.8* 7.8* 8.4* 8.6*  MG  --   --  2.0 2.3   GFR: Estimated Creatinine Clearance: 11.2 mL/min (A) (by C-G formula based on SCr of 4.49 mg/dL (H)). Liver Function Tests: Recent Labs  Lab 07/13/17 2335 07/14/17 1041  AST 20 21  ALT 10* 11*  ALKPHOS 146* 141*  BILITOT 0.8 0.5  PROT 6.7 6.4*  ALBUMIN 3.5 3.3*   No results for input(s): LIPASE, AMYLASE in the last 168 hours. No results for input(s): AMMONIA in the last 168 hours. Coagulation Profile: No results for input(s): INR, PROTIME in the last 168 hours. Cardiac Enzymes: No results for input(s): CKTOTAL, CKMB, CKMBINDEX, TROPONINI in the last 168 hours. BNP (last 3 results) No results for input(s): PROBNP in the last 8760 hours. HbA1C: No results for input(s): HGBA1C in the last 72 hours. CBG: Recent Labs  Lab 07/14/17 0014 07/16/17 1708 07/16/17 2059 07/17/17 0813  GLUCAP 118* 356* 197* 167*   Lipid Profile: No results for input(s): CHOL, HDL, LDLCALC, TRIG, CHOLHDL, LDLDIRECT in the last 72 hours. Thyroid Function Tests: No results for input(s): TSH, T4TOTAL, FREET4, T3FREE, THYROIDAB in the last 72 hours. Anemia Panel: No results for input(s): VITAMINB12, FOLATE, FERRITIN, TIBC, IRON, RETICCTPCT in the last 72 hours. Sepsis Labs: No results for input(s): PROCALCITON, LATICACIDVEN in the last 168 hours.  Recent Results (from the past 240 hour(s))  MRSA PCR Screening     Status: None   Collection Time: 07/14/17  7:03 PM  Result Value Ref Range Status   MRSA  by PCR NEGATIVE NEGATIVE Final    Comment:        The GeneXpert MRSA Assay (FDA approved for NASAL specimens only), is one component of a comprehensive MRSA colonization surveillance program. It is not intended to diagnose MRSA infection nor to guide or monitor treatment for MRSA infections.          Radiology Studies: No results found.      Scheduled Meds: .  stroke: mapping our early stages of recovery book   Does not apply Once  . aspirin EC  81 mg Oral Daily  . atorvastatin  40 mg Oral q1800  . calcium acetate  2,001 mg Oral TID WC  . carvedilol  25 mg Oral BID  . doxercalciferol  4 mcg Intravenous Q T,Th,Sa-HD  . heparin  3,000 Units Dialysis Once in dialysis  . insulin aspart  0-5 Units Subcutaneous QHS  . insulin aspart  0-9 Units Subcutaneous TID WC  . isosorbide mononitrate  30 mg Oral Daily  . multivitamin  1 tablet Oral QHS  .  polyethylene glycol  17 g Oral Daily   Continuous Infusions: . sodium chloride    . sodium chloride       LOS: 3 days        Aline August, MD [facility] Pager [phone]  If 7PM-7AM, please contact night-coverage [email or web address] Password [email or web address] 07/17/2017, 10:57 AM

## 2017-07-17 NOTE — Progress Notes (Signed)
Physical Therapy Treatment Patient Details Name: Kathleen [name] MRN: [id number] DOB: May 04, 1946 Today's Date: 07/17/2017    History of Present Illness 71 year old female with history of hypertension, hyperlipidemia, diabetes mellitus type 2, end-stage renal disease on hemodialysis and coronary artery disease presented with altered mental status and elevated blood pressure.  MRI of the brain was negative for acute stroke.    PT Comments    Pt received up in chair, reports she is feeling a bit better, but now with a 5/10 Rt sided HA worse with head rotation movement. Pt is now A&Ox3. Heavy emphasis on progression of functional strength overall. Pt remains very weak performing transfers, requiring a very elevated surface in relation to short height, knee angle starting at 45 degrees bilat: standard walker height also limiting UE contribution, pt moving toward weight bearing through forearms for improved energy conservation. Pt tolerating session well today. Pt tolerates <33feet ambulation, and reports continued weakness compared to baseline level. Pt progressing toward goals, but overall remains very weak and limited.    Follow Up Recommendations  SNF;Supervision for mobility/OOB     Equipment Recommendations  None recommended by PT(to be determined by facility; )    Recommendations for Other Services       Precautions / Restrictions Precautions Precautions: Fall Restrictions Weight Bearing Restrictions: No    Mobility  Bed Mobility Overal bed mobility: (received up in chair )                Transfers Overall transfer level: Needs assistance Equipment used: Rolling walker (2 wheeled) Transfers: Sit to/from Stand Sit to Stand: From elevated surface;Supervision;Min guard         General transfer comment: 2 sets of 5, near maximal effort required each time, bed progressively elevated to decrease max to mod effort. Pt largely limited by walker height which is too high,  does not allow for ergonomic BUE use, woul dbenefit from pediatric height RW.   Ambulation/Gait Ambulation/Gait assistance: Min guard Ambulation Distance (Feet): 38 Feet Assistive device: Rolling walker (2 wheeled)   Gait velocity: 0.57m/s  Gait velocity interpretation: <1.8 ft/sec, indicative of risk for recurrent falls General Gait Details: very slow, transisitions and turns require significant additional time; would benefit from a pediatric RW height   Stairs            Wheelchair Mobility    Modified Rankin (Stroke Patients Only)       Balance Overall balance assessment: Modified Independent;No apparent balance deficits (not formally assessed)         Standing balance support: During functional activity;Bilateral upper extremity supported Standing balance-Leahy Scale: Fair                              Cognition Arousal/Alertness: Awake/alert Behavior During Therapy: WFL for tasks assessed/performed Overall Cognitive Status: Within Functional Limits for tasks assessed(A&Ox3)                                        Exercises      General Comments        Pertinent Vitals/Pain Pain Assessment: 0-10 Pain Score: 5  Pain Location: Right sided HA, worse with head turns  Pain Descriptors / Indicators: Aching Pain Intervention(s): Limited activity within patient's tolerance;Monitored during session    Home Living  Prior Function            PT Goals (current goals can now be found in the care plan section) Acute Rehab PT Goals Patient Stated Goal: regain strength and return to PLOF in mobility  PT Goal Formulation: With patient Time For Goal Achievement: 07/29/17 Potential to Achieve Goals: Fair Progress towards PT goals: Progressing toward goals    Frequency    Min 3X/week      PT Plan Current plan remains appropriate    Co-evaluation              AM-PAC PT "6 Clicks" Daily  Activity  Outcome Measure  Difficulty turning over in bed (including adjusting bedclothes, sheets and blankets)?: Unable Difficulty moving from lying on back to sitting on the side of the bed? : Unable Difficulty sitting down on and standing up from a chair with arms (e.g., wheelchair, bedside commode, etc,.)?: Unable Help needed moving to and from a bed to chair (including a wheelchair)?: A Little Help needed walking in hospital room?: A Lot Help needed climbing 3-5 steps with a railing? : A Lot 6 Click Score: 10    End of Session Equipment Utilized During Treatment: Gait belt Activity Tolerance: Patient limited by fatigue;No increased pain Patient left: in bed;with call bell/phone within reach;with bed alarm set;with nursing/sitter in room Nurse Communication: Mobility status PT Visit Diagnosis: Unsteadiness on feet (R26.81);Muscle weakness (generalized) (M62.81)     Time: 7026-3785 PT Time Calculation (min) (ACUTE ONLY): 24 min  Charges:  $Therapeutic Exercise: 8-22 mins $Therapeutic Activity: 8-22 mins                    G Codes:       1:07 PM, 08/04/2017 Etta Grandchild, PT, DPT [facility] - [place] [phone] (Pager)  [phone] (Mobile)  [phone] (Office)      Damari Hiltz C 2017-08-04, 1:03 PM

## 2017-07-17 NOTE — Progress Notes (Signed)
[facility] Progress Note   Dialysis Orders: [facility] 4h    79kg    2/2.25 bath  Hep 3000    RIJ TDC/ L AVF -hect 4 ug -mircera 50 ug due 12/11 -venofer 50 q wk, start 12/ 11 -labs > Hb 11, tsat 34%,  Ca 8.5/ P 7, pth 862  Assessment/Plan: 1. AMS - much better.  Has Baclofen listed on her home med lists, this is contraindicated in ESRD and causes delirium.  Will list as intolerance / contraindication in her chart.  2. ESRD - gets HD TTS, stable K 4.9 - HD first round Tuesday in anticipation of d/c 3. Vol - stable volume, under dry wt slightly - get standing wts 4. HTN - BP's normal, on coreg only 5. CAD 6. DM2 7. Dispo - SNF placement due to debility- discharge will be delayed due to transportation issues; she does not wish to be DNR - she wants full CODE - have discontinued 8. Nutirtion - changed to renal carb mod due to high content of heart healthy diet alb 3.3  9. MBD -  hectorol /binders 10. Anemia - hgb 12 0 -no ESA - resume weekly Fe at d/c  Myriam Jacobson, PA-C [place] 07/17/2017,9:32 AM  LOS: 3 days   Pt seen, examined and agree w A/P as above.  [name] MD [employer] pager [phone]   07/17/2017, 1:36 PM    Subjective:    No c/o. Ambulates with assistance  Objective Vitals:   07/16/17 1454 07/16/17 2111 07/17/17 0152 07/17/17 0642  BP: 115/61 112/62 (!) 146/56 (!) 141/60  Pulse: 74 72 66 67  Resp: 18 17 20 16   Temp:  97.6 F (36.4 C) 98.1 F (36.7 C) 97.8 F (36.6 C)  TempSrc:  Oral Oral Oral  SpO2: 98% 100% 95% 97%  Weight:      Height:       Physical Exam General: NAD up sitting in chair Heart: RRR Lungs: no rales Abdomen: soft NT Extremities: no LE edema Dialysis Access: left upper AVF + bruit   Additional Objective Labs: Basic Metabolic Panel: Recent Labs  Lab 07/14/17 1041 07/16/17 0243 07/17/17 0228  NA 136 138 133*  K 5.6* 4.0 4.9  CL 99* 101 96*   CO2 21* 24 25  GLUCOSE 119* 166* 169*  BUN 53* 14 35*  CREATININE 6.17* 3.17* 4.49*  CALCIUM 7.8* 8.4* 8.6*   Liver Function Tests: Recent Labs  Lab 07/13/17 2335 07/14/17 1041  AST 20 21  ALT 10* 11*  ALKPHOS 146* 141*  BILITOT 0.8 0.5  PROT 6.7 6.4*  ALBUMIN 3.5 3.3*   No results for input(s): LIPASE, AMYLASE in the last 168 hours. CBC: Recent Labs  Lab 07/13/17 2335 07/14/17 1041 07/16/17 0243 07/17/17 0228  WBC 4.8 4.7 4.8 5.5  NEUTROABS 2.5  --  2.6 2.3  HGB 13.3 13.6 12.7 12.4  HCT 42.0 42.6 39.7 41.0  MCV 95.0 94.9 95.9 96.9  PLT 220 234 189 185   Blood Culture    Component Value Date/Time   SDES URINE, RANDOM 02/08/2017 1351   SPECREQUEST NONE 02/08/2017 1351   CULT >=100,000 COLONIES/mL ESCHERICHIA COLI (A) 02/08/2017 1351   REPTSTATUS 02/11/2017 FINAL 02/08/2017 1351    Cardiac Enzymes: No results for input(s): CKTOTAL, CKMB, CKMBINDEX, TROPONINI in the last 168 hours. CBG: Recent Labs  Lab 07/14/17 0014 07/16/17 1708 07/16/17 2059 07/17/17 0813  GLUCAP 118* 356* 197* 167*  Iron Studies: No results for input(s): IRON, TIBC, TRANSFERRIN, FERRITIN in the last 72 hours. Lab Results  Component Value Date   INR 1.36 12/18/2015   INR 1.25 06/10/2014   INR 1.11 01/26/2014   Studies/Results: No results found. Medications: . sodium chloride    . sodium chloride     .  stroke: mapping our early stages of recovery book   Does not apply Once  . aspirin EC  81 mg Oral Daily  . atorvastatin  40 mg Oral q1800  . calcium acetate  2,001 mg Oral TID WC  . carvedilol  25 mg Oral BID  . doxercalciferol  4 mcg Intravenous Q T,Th,Sa-HD  . heparin  3,000 Units Dialysis Once in dialysis  . insulin aspart  0-5 Units Subcutaneous QHS  . insulin aspart  0-9 Units Subcutaneous TID WC  . isosorbide mononitrate  30 mg Oral Daily  . multivitamin  1 tablet Oral QHS  . polyethylene glycol  17 g Oral Daily

## 2017-07-18 LAB — BASIC METABOLIC PANEL
ANION GAP: 12 (ref 5–15)
BUN: 50 mg/dL — ABNORMAL HIGH (ref 6–20)
CALCIUM: 8.4 mg/dL — AB (ref 8.9–10.3)
CO2: 22 mmol/L (ref 22–32)
CREATININE: 5.68 mg/dL — AB (ref 0.44–1.00)
Chloride: 95 mmol/L — ABNORMAL LOW (ref 101–111)
GFR calc non Af Amer: 7 mL/min — ABNORMAL LOW (ref >60)
GFR, EST AFRICAN AMERICAN: 8 mL/min — AB (ref >60)
Glucose, Bld: 148 mg/dL — ABNORMAL HIGH (ref 65–99)
Potassium: 5.6 mmol/L — ABNORMAL HIGH (ref 3.5–5.1)
SODIUM: 129 mmol/L — AB (ref 135–145)

## 2017-07-18 LAB — GLUCOSE, CAPILLARY
GLUCOSE-CAPILLARY: 96 mg/dL (ref 65–99)
Glucose-Capillary: 193 mg/dL — ABNORMAL HIGH (ref 65–99)
Glucose-Capillary: 184 mg/dL — ABNORMAL HIGH (ref 65–99)

## 2017-07-18 LAB — MAGNESIUM: MAGNESIUM: 2.1 mg/dL (ref 1.7–2.4)

## 2017-07-18 MED ORDER — DOXERCALCIFEROL 4 MCG/2ML IV SOLN
INTRAVENOUS | Status: AC
  Administered 2017-07-18: 4 ug via INTRAVENOUS
  Filled 2017-07-18: qty 2

## 2017-07-18 MED ORDER — LORATADINE 10 MG PO TABS
10.0000 mg | ORAL_TABLET | Freq: Every day | ORAL | Status: DC
  Administered 2017-07-18 – 2017-07-19 (×2): 10 mg via ORAL
  Filled 2017-07-18 (×2): qty 1

## 2017-07-18 NOTE — Progress Notes (Signed)
Pt transported to HD, report given to HD nurse.

## 2017-07-18 NOTE — Progress Notes (Signed)
PROGRESS NOTE    Kathleen [name]  ION:[id number] DOB: Sep 10, 1945 DOA: 07/13/2017 PCP: Benito Mccreedy, MD   Brief Narrative:71 year old female with history of hypertension, hyperlipidemia, diabetes mellitus type 2, end-stage renal disease on hemodialysis and coronary artery disease presented with altered mental status and elevated blood pressure.  MRI of the brain was negative for acute stroke.  Nephrology was consulted for continuation of dialysis.  Vascular surgery was consulted for abnormal findings on MRA of the head and neck.    Assessment & Plan:   Active Problems:   Altered mental status  Acute encephalopathy/altered mental status -Questionable cause.  May be baclofen related.  Baclofen  has been discontinued. -Mental status has much improved -MRI of the brain was negative for acute stroke -Continue monitoring mental status -Diet as per SLP recommendations - Continue PT evaluation  Question of right-sided weakness -Improving.  MRI of the brain is negative for acute stroke - PT eval  Left vertebral artery occlusion along with stenosis of left subclavian artery -Vascular surgery consult appreciated.  No surgical interventions recommended at this time. -Altered mental status probably not because of these arterial occlusions as per vascular surgery  End-stage renal disease on hemodialysis -Continue dialysis as per nephrology schedule  Diabetes mellitus type 2 -Continue Accu-Cheks with coverage   Hypertension -Monitor blood pressure.  Continue isosorbide mononitrate and Coreg  Generalized deconditioning -PT recommends nursing home placement.  Social worker consult      DVT prophylaxis: Heparin heparin Code Status full code :Family Communication: No family available Disposition Plan: Nursing home placement when bed is available Consultants: Nephrology and vascular surgery  Procedures: Echo ejection fraction 60-65% Antimicrobials:  None Subjective: No  complaints other than postnasal drip no chest pain shortness of breath cough nausea vomiting or diarrhea reported.  Objective: Patient sitting up in her chair trying to eat her meal. Vitals:   07/18/17 1147 07/18/17 1149 07/18/17 1308 07/18/17 1500  BP: (!) 179/71 (!) 199/84 (!) 119/52 (!) 111/92  Pulse: 80 86 75 68  Resp:   15 20  Temp:   98.2 F (36.8 C) 98.1 F (36.7 C)  TempSrc:   Oral Oral  SpO2:   98% 98%  Weight:      Height:        Intake/Output Summary (Last 24 hours) at 07/18/2017 1810 Last data filed at 07/18/2017 1400 Gross per 24 hour  Intake 610 ml  Output 1500 ml  Net -890 ml   Filed Weights   07/15/17 2254 07/18/17 0703 07/18/17 1130  Weight: 77.3 kg (170 lb 6.7 oz) 81.2 kg (179 lb 0.2 oz) 79.6 kg (175 lb 7.8 oz)    Examination:  General exam: Appears calm and comfortable  Respiratory system: Clear to auscultation. Respiratory effort normal. Cardiovascular system: S1 & S2 heard, RRR. No JVD, murmurs, rubs, gallops or clicks. No pedal edema. Gastrointestinal system: Abdomen is nondistended, soft and nontender. No organomegaly or masses felt. Normal bowel sounds heard. Central nervous system: Alert and oriented. No focal neurological deficits. Extremities: Symmetric 5 x 5 power. Skin: No rashes, lesions or ulcers Psychiatry: Judgement and insight appear normal. Mood & affect appropriate.     Data Reviewed: I have personally reviewed following labs and imaging studies  CBC: Recent Labs  Lab 07/13/17 2335 07/14/17 1041 07/16/17 0243 07/17/17 0228  WBC 4.8 4.7 4.8 5.5  NEUTROABS 2.5  --  2.6 2.3  HGB 13.3 13.6 12.7 12.4  HCT 42.0 42.6 39.7 41.0  MCV 95.0 94.9 95.9 96.9  PLT 220 234 189 924   Basic Metabolic Panel: Recent Labs  Lab 07/13/17 2335 07/14/17 1041 07/16/17 0243 07/17/17 0228 07/18/17 0237  NA 136 136 138 133* 129*  K 4.7 5.6* 4.0 4.9 5.6*  CL 95* 99* 101 96* 95*  CO2 25 21* 24 25 22   GLUCOSE 121* 119* 166* 169* 148*  BUN 46*  53* 14 35* 50*  CREATININE 5.82* 6.17* 3.17* 4.49* 5.68*  CALCIUM 7.8* 7.8* 8.4* 8.6* 8.4*  MG  --   --  2.0 2.3 2.1   GFR: Estimated Creatinine Clearance: 8.9 mL/min (A) (by C-G formula based on SCr of 5.68 mg/dL (H)). Liver Function Tests: Recent Labs  Lab 07/13/17 2335 07/14/17 1041  AST 20 21  ALT 10* 11*  ALKPHOS 146* 141*  BILITOT 0.8 0.5  PROT 6.7 6.4*  ALBUMIN 3.5 3.3*   No results for input(s): LIPASE, AMYLASE in the last 168 hours. No results for input(s): AMMONIA in the last 168 hours. Coagulation Profile: No results for input(s): INR, PROTIME in the last 168 hours. Cardiac Enzymes: No results for input(s): CKTOTAL, CKMB, CKMBINDEX, TROPONINI in the last 168 hours. BNP (last 3 results) No results for input(s): PROBNP in the last 8760 hours. HbA1C: No results for input(s): HGBA1C in the last 72 hours. CBG: Recent Labs  Lab 07/17/17 1233 07/17/17 1730 07/17/17 2214 07/18/17 1219 07/18/17 1646  GLUCAP 277* 166* 183* 96 193*   Lipid Profile: No results for input(s): CHOL, HDL, LDLCALC, TRIG, CHOLHDL, LDLDIRECT in the last 72 hours. Thyroid Function Tests: No results for input(s): TSH, T4TOTAL, FREET4, T3FREE, THYROIDAB in the last 72 hours. Anemia Panel: No results for input(s): VITAMINB12, FOLATE, FERRITIN, TIBC, IRON, RETICCTPCT in the last 72 hours. Sepsis Labs: No results for input(s): PROCALCITON, LATICACIDVEN in the last 168 hours.  Recent Results (from the past 240 hour(s))  MRSA PCR Screening     Status: None   Collection Time: 07/14/17  7:03 PM  Result Value Ref Range Status   MRSA by PCR NEGATIVE NEGATIVE Final    Comment:        The GeneXpert MRSA Assay (FDA approved for NASAL specimens only), is one component of a comprehensive MRSA colonization surveillance program. It is not intended to diagnose MRSA infection nor to guide or monitor treatment for MRSA infections.          Radiology Studies: No results  found.      Scheduled Meds: .  stroke: mapping our early stages of recovery book   Does not apply Once  . aspirin EC  81 mg Oral Daily  . atorvastatin  40 mg Oral q1800  . calcium acetate  2,001 mg Oral TID WC  . carvedilol  25 mg Oral BID  . doxercalciferol  4 mcg Intravenous Q T,Th,Sa-HD  . insulin aspart  0-5 Units Subcutaneous QHS  . insulin aspart  0-9 Units Subcutaneous TID WC  . isosorbide mononitrate  30 mg Oral Daily  . multivitamin  1 tablet Oral QHS  . polyethylene glycol  17 g Oral Daily   Continuous Infusions:   LOS: 4 days      Georgette Shell, MD [facility]  If 7PM-7AM, please contact night-coverage [email or web address] Password [email or web address] 07/18/2017, 6:10 PM

## 2017-07-18 NOTE — Progress Notes (Signed)
[facility] Progress Note   Dialysis Orders: [facility] 4h 79kg 2/2.25 bath Hep 3000 RIJ TDC/ L AVF -hect 4 ug -mircera 50 ug due 12/11 -venofer 50 q wk, start 12/ 11 -labs >Hb 11, tsat 34%, Ca 8.5/ P 7, pth 862   Assessment/Plan: 1. AMS - much better.Has Baclofen listed on her home med lists, this iscontraindicated in ESRD and causes delirium.Will list as intolerance / contraindication in her chart. 2. ESRD- gets HD TTS K 5.6 - 2 K bath 3. Vol- stable volume, pre HD wt 81.2 - goal 2 L - may need to raise edw to 79.5 - was having BP drops into the 80s and 90s during outpt HD treatments prior to admission. 4. HTN - BP's normal, on coreg only- needs to have standing BP pre and post HD after discharge 5. CAD 6. DM2 7. Dispo - SNF placement due to debility;  she does not wish to be DNR - she wants full CODE - have discontinued and d/w primary 8. Nutirtion - changed to renal carb mod due to high content of heart healthy diet alb 3.3  9. MBD -  hectorol /binders 10. Anemia - hgb 12 4 -no ESA - resume weekly Fe at d/c  Myriam Jacobson, PA-C [place] [phone] 07/18/2017,7:55 AM  LOS: 4 days   Pt seen, examined and agree w A/P as above.  [name] MD [facility] pager [phone]   07/18/2017, 1:50 PM    Subjective:   No c/o  Objective Vitals:   07/18/17 0358 07/18/17 0703 07/18/17 0720 07/18/17 0730  BP: 138/80 140/60 133/63 136/66  Pulse: 63 (!) 53 61 63  Resp: 16 17    Temp: 97.6 F (36.4 C)     TempSrc: Oral Oral    SpO2: 100% 98%    Weight:  81.2 kg (179 lb 0.2 oz)    Height:       Physical Exam General: NAD on HD  Heart: RRR with some ectopy Lungs: no rales Abdomen: obese soft NT Extremities: no LE edema Dialysis Access: right IJ and left upper AVF + bruit   Additional Objective Labs: Basic Metabolic Panel: Recent Labs  Lab 07/16/17 0243 07/17/17 0228 07/18/17 0237  NA  138 133* 129*  K 4.0 4.9 5.6*  CL 101 96* 95*  CO2 24 25 22   GLUCOSE 166* 169* 148*  BUN 14 35* 50*  CREATININE 3.17* 4.49* 5.68*  CALCIUM 8.4* 8.6* 8.4*   Liver Function Tests: Recent Labs  Lab 07/13/17 2335 07/14/17 1041  AST 20 21  ALT 10* 11*  ALKPHOS 146* 141*  BILITOT 0.8 0.5  PROT 6.7 6.4*  ALBUMIN 3.5 3.3*   No results for input(s): LIPASE, AMYLASE in the last 168 hours. CBC: Recent Labs  Lab 07/13/17 2335 07/14/17 1041 07/16/17 0243 07/17/17 0228  WBC 4.8 4.7 4.8 5.5  NEUTROABS 2.5  --  2.6 2.3  HGB 13.3 13.6 12.7 12.4  HCT 42.0 42.6 39.7 41.0  MCV 95.0 94.9 95.9 96.9  PLT 220 234 189 185   Blood Culture    Component Value Date/Time   SDES URINE, RANDOM 02/08/2017 1351   SPECREQUEST NONE 02/08/2017 1351   CULT >=100,000 COLONIES/mL ESCHERICHIA COLI (A) 02/08/2017 1351   REPTSTATUS 02/11/2017 FINAL 02/08/2017 1351    Cardiac Enzymes: No results for input(s): CKTOTAL, CKMB, CKMBINDEX, TROPONINI in the last 168 hours. CBG: Recent Labs  Lab 07/16/17 2059 07/17/17 0813 07/17/17 1233 07/17/17 1730 07/17/17  2214  GLUCAP 197* 167* 277* 166* 183*   Iron Studies: No results for input(s): IRON, TIBC, TRANSFERRIN, FERRITIN in the last 72 hours. Lab Results  Component Value Date   INR 1.36 12/18/2015   INR 1.25 06/10/2014   INR 1.11 01/26/2014   Studies/Results: No results found. Medications: . sodium chloride    . sodium chloride     .  stroke: mapping our early stages of recovery book   Does not apply Once  . aspirin EC  81 mg Oral Daily  . atorvastatin  40 mg Oral q1800  . calcium acetate  2,001 mg Oral TID WC  . carvedilol  25 mg Oral BID  . doxercalciferol  4 mcg Intravenous Q T,Th,Sa-HD  . heparin  3,000 Units Dialysis Once in dialysis  . insulin aspart  0-5 Units Subcutaneous QHS  . insulin aspart  0-9 Units Subcutaneous TID WC  . isosorbide mononitrate  30 mg Oral Daily  . multivitamin  1 tablet Oral QHS  . polyethylene glycol  17 g  Oral Daily

## 2017-07-19 DIAGNOSIS — E139 Other specified diabetes mellitus without complications: Secondary | ICD-10-CM

## 2017-07-19 LAB — GLUCOSE, CAPILLARY
GLUCOSE-CAPILLARY: 238 mg/dL — AB (ref 65–99)
Glucose-Capillary: 112 mg/dL — ABNORMAL HIGH (ref 65–99)

## 2017-07-19 MED ORDER — ATORVASTATIN CALCIUM 40 MG PO TABS: 40.0000 mg | ORAL_TABLET | Freq: Every day | ORAL | 0 refills | Status: AC

## 2017-07-19 NOTE — Progress Notes (Signed)
Kathleen [name] to be D/C'd home with home health per MD order. Discussed with the patient and all questions fully answered.  Allergies as of 07/19/2017      Reactions   Baclofen    Severe delirium when given to pateint in Dec 2018.       Medication List    STOP taking these medications   baclofen 10 MG tablet Commonly known as:  LIORESAL   furosemide 40 MG tablet Commonly known as:  LASIX   HYDROcodone-acetaminophen 5-325 MG tablet Commonly known as:  NORCO/VICODIN   insulin aspart 100 UNIT/ML injection Commonly known as:  novoLOG   isosorbide mononitrate 30 MG 24 hr tablet Commonly known as:  IMDUR   meclizine 12.5 MG tablet Commonly known as:  ANTIVERT   oxyCODONE-acetaminophen 5-325 MG tablet Commonly known as:  PERCOCET/ROXICET     TAKE these medications   aspirin 81 MG EC tablet Take 1 tablet (81 mg total) by mouth daily.   atorvastatin 40 MG tablet Commonly known as:  LIPITOR Take 1 tablet (40 mg total) by mouth daily at 6 PM.   brimonidine-timolol 0.2-0.5 % ophthalmic solution Commonly known as:  COMBIGAN Place 1 drop into the right eye 2 (two) times daily as needed (for glaucoma).   calcitRIOL 0.5 MCG capsule Commonly known as:  ROCALTROL Take 1 capsule (0.5 mcg total) by mouth Every Tuesday,Thursday,and Saturday with dialysis.   calcium acetate 667 MG capsule Commonly known as:  PHOSLO Take 667 mg by mouth 3 (three) times daily with meals.   carvedilol 25 MG tablet Commonly known as:  COREG Take 25 mg by mouth 2 (two) times daily.   hydrALAZINE 25 MG tablet Commonly known as:  APRESOLINE Take 1 tablet (25 mg total) by mouth every 8 (eight) hours as needed (SBP > 170  or DBP > 110).   insulin detemir 100 UNIT/ML injection Commonly known as:  LEVEMIR Inject 0.15 mLs (15 Units total) into the skin at bedtime. What changed:  how much to take   multivitamin Tabs tablet Take 1 tablet by mouth at bedtime.   polyethylene glycol packet Commonly  known as:  MIRALAX / GLYCOLAX Take 17 g by mouth daily.       VVS, Skin clean, dry and intact without evidence of skin break down, no evidence of skin tears noted.  IV catheter discontinued intact. Site without signs and symptoms of complications. Dressing and pressure applied.  An After Visit Summary was printed and given to the patient.  Patient escorted via [place], and D/C home via private auto.  Melonie [place]  07/19/2017 5:05 PM

## 2017-07-19 NOTE — Clinical Social Work Placement (Signed)
   CLINICAL SOCIAL WORK PLACEMENT  NOTE  Date:  07/19/2017  Patient Details  Name: Kathleen [name] MRN: [id number] Date of Birth: 07/27/1946  Clinical Social Work is seeking post-discharge placement for this patient at the [place] level of care (*CSW will initial, date and re-position this form in  chart as items are completed):  Yes   Patient/family provided with [facility] Work Department's list of facilities offering this level of care within the geographic area requested by the patient (or if unable, by the patient's family).  Yes   Patient/family informed of their freedom to choose among providers that offer the needed level of care, that participate in Medicare, Medicaid or managed care program needed by the patient, have an available bed and are willing to accept the patient.  Yes   Patient/family informed of [facility]'s ownership interest in [facility] and [facility], as well as of the fact that they are under no obligation to receive care at these facilities.  PASRR submitted to EDS on       PASRR number received on       Existing PASRR number confirmed on 07/19/17     FL2 transmitted to all facilities in geographic area requested by pt/family on 07/19/17     FL2 transmitted to all facilities within larger geographic area on       Patient informed that his/her managed care company has contracts with or will negotiate with certain facilities, including the following:        Yes   Patient/family informed of bed offers received.  Patient chooses bed at [facility]     Physician recommends and patient chooses bed at      Patient to be transferred to [facility] on 07/19/17.  Patient to be transferred to facility by [facility]     Patient family notified on 07/19/17 of transfer.  Name of family member notified:  Patient declined     PHYSICIAN Please sign FL2, Please prepare priority discharge summary,  including medications     Additional Comment:    _______________________________________________ Benard Halsted, [place] 07/19/2017, 11:19 AM

## 2017-07-19 NOTE — Progress Notes (Signed)
Kathleen [name] Progress Note  Dialysis Orders: [facility] 4h 79kg 2/2.25 bath Hep 3000 RIJ TDC/ L AVF -hect 4 ug -mircera 50 ug due 12/11 -venofer 50 q wk, start 12/ 11 -labs >Hb 11, tsat 34%, Ca 8.5/ P 7, pth 862   Assessment/Plan: 1. AMS - resolved.Had Baclofen listed on her home med lists, this iscontraindicated in ESRD and causes delirium.Will list as intolerance / contraindication in her chart. 2. ESRD- gets HD TTS K 5.6 - 2 K bath; reassess AVF for use next week 3. Vol- stable volume,  to raise edw to 79.5 - was having BP drops into the 80s and 90s during outpt HD treatments prior to admission. 4. HTN - BP's normal, on coreg only- needs to have standing BP pre and post HD after discharge net UF 1.5 Tuesday with post wt 79.5 - 5. CAD 6. DM2 7. Dispo for d/c home - full code 8. Nutirtion - changed to renal carb mod due to high content of heart healthy diet alb 3.3  9. MBD - hectorol /binders 10. Anemia - hgb 12 4  - resume weekly Fe at d/c- hold ESA at d/c  Myriam Jacobson, PA-C [place] 07/19/2017,9:49 AM  LOS: 5 days   Pt seen, examined and agree w A/P as above.  [name] MD [employer] pager [phone]   07/19/2017, 2:20 PM    Subjective:   No problems with HD yesterday - plans to d/c home Ex-husband , who is now her best friend transports her. Feels good.  Objective Vitals:   07/18/17 2241 07/19/17 0204 07/19/17 0601 07/19/17 0832  BP: 128/70 (!) 131/56 (!) 115/50 139/67  Pulse: (!) 57 70 68 70  Resp:  18 17 (!) 22  Temp:  98 F (36.7 C) 98.4 F (36.9 C) 98 F (36.7 C)  TempSrc:  Oral Oral Oral  SpO2: 99% 98% 100% 98%  Weight:      Height:       Physical Exam General: sitting in chair -looks great Heart: RRR Lungs: no rales Abdomen: obese soft Extremities: no LE edema Dialysis Access:  Left upper AVF resting and using right IJ   Additional  Objective Labs: Basic Metabolic Panel: Recent Labs  Lab 07/16/17 0243 07/17/17 0228 07/18/17 0237  NA 138 133* 129*  K 4.0 4.9 5.6*  CL 101 96* 95*  CO2 24 25 22   GLUCOSE 166* 169* 148*  BUN 14 35* 50*  CREATININE 3.17* 4.49* 5.68*  CALCIUM 8.4* 8.6* 8.4*   Liver Function Tests: Recent Labs  Lab 07/13/17 2335 07/14/17 1041  AST 20 21  ALT 10* 11*  ALKPHOS 146* 141*  BILITOT 0.8 0.5  PROT 6.7 6.4*  ALBUMIN 3.5 3.3*   No results for input(s): LIPASE, AMYLASE in the last 168 hours. CBC: Recent Labs  Lab 07/13/17 2335 07/14/17 1041 07/16/17 0243 07/17/17 0228  WBC 4.8 4.7 4.8 5.5  NEUTROABS 2.5  --  2.6 2.3  HGB 13.3 13.6 12.7 12.4  HCT 42.0 42.6 39.7 41.0  MCV 95.0 94.9 95.9 96.9  PLT 220 234 189 185   Blood Culture    Component Value Date/Time   SDES URINE, RANDOM 02/08/2017 1351   SPECREQUEST NONE 02/08/2017 1351   CULT >=100,000 COLONIES/mL ESCHERICHIA COLI (A) 02/08/2017 1351   REPTSTATUS 02/11/2017 FINAL 02/08/2017 1351    Cardiac Enzymes: No results for input(s): CKTOTAL, CKMB, CKMBINDEX, TROPONINI in the last 168 hours. CBG: Recent Labs  Lab 07/17/17  2214 07/18/17 1219 07/18/17 1646 07/18/17 2104 07/19/17 0811  GLUCAP 183* 96 193* 184* 112*   Iron Studies: No results for input(s): IRON, TIBC, TRANSFERRIN, FERRITIN in the last 72 hours. Lab Results  Component Value Date   INR 1.36 12/18/2015   INR 1.25 06/10/2014   INR 1.11 01/26/2014   Studies/Results: No results found. Medications:  .  stroke: mapping our early stages of recovery book   Does not apply Once  . aspirin EC  81 mg Oral Daily  . atorvastatin  40 mg Oral q1800  . calcium acetate  2,001 mg Oral TID WC  . carvedilol  25 mg Oral BID  . doxercalciferol  4 mcg Intravenous Q T,Th,Sa-HD  . insulin aspart  0-5 Units Subcutaneous QHS  . insulin aspart  0-9 Units Subcutaneous TID WC  . isosorbide mononitrate  30 mg Oral Daily  . loratadine  10 mg Oral Daily  . multivitamin  1  tablet Oral QHS  . polyethylene glycol  17 g Oral Daily

## 2017-07-19 NOTE — Discharge Summary (Addendum)
Physician Discharge Summary  Kathleen [name] WUJ:[id number] DOB: [birth year] DOA: 07/13/2017  PCP: Benito Mccreedy, MD  Admit date: 07/13/2017 Discharge date: 07/19/2017  Admitted From: Home Disposition:  SNF or Home with home health  Recommendations for Outpatient Follow-up:  1. Follow up with SNF provider at earliest convenience or with PCP in a week 2. Follow-up with outpatient dialysis as scheduled 3. Follow-up with vascular surgery/Dr. Oneida Alar in 1-2 weeks 4. Stop Baclofen  Home Health: No  Equipment/Devices:None  Discharge Condition: Stable  CODE STATUS: Full  Diet recommendation: Heart Healthy / Carb Modified Neysa Hotter hemodialysis diet  Brief/Interim Summary: 71 year old female with history of hypertension, hyperlipidemia, diabetes mellitus type 2, end-stage renal disease on hemodialysis and coronary artery disease presented with altered mental status and elevated blood pressure.  MRI of the brain was negative for acute stroke.  Nephrology was consulted for continuation of dialysis.  Vascular surgery was consulted for abnormal findings on MRA of the head and neck.  Vascular surgery recommended conservative management and outpatient follow-up.  Mental status has much improved.  Baclofen has been discontinued.  PT recommended nursing home placement.  Discharge patient to nursing home once bed is available.   Addendum: Patient is declining SNF placement. PT evaluation today is pending. Patient will probably be discharged home with Home Health.   Discharge Diagnoses:  Active Problems:   Altered mental status   Acute encephalopathy/altered mental status -Questionable cause.  May be baclofen related.  Baclofen  has been discontinued. -Mental status has much improved -MRI of the brain was negative for acute stroke -Diet as per SLP recommendations - Continue PT evaluation in the nursing home -Discharge the patient to nursing home once bed is available  Question of right-sided  weakness -Improving.  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  No surgical interventions recommended at this time. -Altered mental status probably not because of these arterial occlusions as per vascular surgery -Outpatient follow-up with vascular surgery  End-stage renal disease on hemodialysis -Continue dialysis as per nephrology schedule as an outpatient -Patient tolerating dialysis as an inpatient as well  Diabetes mellitus type 2 -Continue Levemir.  Outpatient follow-up  Hypertension -Monitor blood pressure.  Continue hydralazine and Coreg.  Outpatient follow-up  Generalized deconditioning -Continue PT in the nursing home   Discharge Instructions  Discharge Instructions    Ambulatory referral to Vascular Surgery   Complete by:  As directed    Follow  Up in 1-2 weeks   Call MD for:  difficulty breathing, headache or visual disturbances   Complete by:  As directed    Call MD for:  extreme fatigue   Complete by:  As directed    Call MD for:  hives   Complete by:  As directed    Call MD for:  persistant dizziness or light-headedness   Complete by:  As directed    Call MD for:  persistant nausea and vomiting   Complete by:  As directed    Call MD for:  severe uncontrolled pain   Complete by:  As directed    Call MD for:  temperature >100.4   Complete by:  As directed    Diet - low sodium heart healthy   Complete by:  As directed    Diet Carb Modified   Complete by:  As directed    Discharge instructions   Complete by:  As  directed    Carb modified/Renal hemodialysis diet   Increase activity slowly   Complete by:  As directed      Allergies as of 07/19/2017      Reactions   Baclofen    Severe delirium when given to pateint in Dec 2018.       Medication List    STOP taking these medications   baclofen 10 MG  tablet Commonly known as:  LIORESAL   furosemide 40 MG tablet Commonly known as:  LASIX   HYDROcodone-acetaminophen 5-325 MG tablet Commonly known as:  NORCO/VICODIN   insulin aspart 100 UNIT/ML injection Commonly known as:  novoLOG   isosorbide mononitrate 30 MG 24 hr tablet Commonly known as:  IMDUR   meclizine 12.5 MG tablet Commonly known as:  ANTIVERT   oxyCODONE-acetaminophen 5-325 MG tablet Commonly known as:  PERCOCET/ROXICET     TAKE these medications   aspirin 81 MG EC tablet Take 1 tablet (81 mg total) by mouth daily.   atorvastatin 40 MG tablet Commonly known as:  LIPITOR Take 1 tablet (40 mg total) by mouth daily at 6 PM.   brimonidine-timolol 0.2-0.5 % ophthalmic solution Commonly known as:  COMBIGAN Place 1 drop into the right eye 2 (two) times daily as needed (for glaucoma).   calcitRIOL 0.5 MCG capsule Commonly known as:  ROCALTROL Take 1 capsule (0.5 mcg total) by mouth Every Tuesday,Thursday,and Saturday with dialysis.   calcium acetate 667 MG capsule Commonly known as:  PHOSLO Take 667 mg by mouth 3 (three) times daily with meals.   carvedilol 25 MG tablet Commonly known as:  COREG Take 25 mg by mouth 2 (two) times daily.   hydrALAZINE 25 MG tablet Commonly known as:  APRESOLINE Take 1 tablet (25 mg total) by mouth every 8 (eight) hours as needed (SBP > 170  or DBP > 110).   insulin detemir 100 UNIT/ML injection Commonly known as:  LEVEMIR Inject 0.15 mLs (15 Units total) into the skin at bedtime. What changed:  how much to take   multivitamin Tabs tablet Take 1 tablet by mouth at bedtime.   polyethylene glycol packet Commonly known as:  MIRALAX / GLYCOLAX Take 17 g by mouth daily.       Contact information for follow-up providers    Elam Dutch, MD. Schedule an appointment as soon as possible for a visit in 2 week(s).   Specialties:  Vascular Surgery, Cardiology Contact information: [address] [place]  [address] [phone]            Contact information for after-discharge care    [facility].   Service:  Skilled Nursing Contact information: [address] [address] [phone]                 Allergies  Allergen Reactions  . Baclofen     Severe delirium when given to pateint in Dec 2018.     Consultations:  Nephrology  Vascular surgery   Procedures/Studies: Ct Angio Head W Or Wo Contrast  Result Date: 07/14/2017 CLINICAL DATA:  Right-sided weakness. Abnormal MRA with decreased signal in the left vertebral artery and basilar artery. Possible stricture or occlusion. Add EXAM: CT ANGIOGRAPHY HEAD AND NECK TECHNIQUE: Multidetector CT imaging of the head and neck was performed using the standard protocol during bolus administration of intravenous contrast. Multiplanar CT image reconstructions and MIPs were obtained to evaluate the vascular anatomy. Carotid stenosis measurements (when  applicable) are obtained utilizing NASCET criteria, using the distal internal carotid diameter as the denominator. CONTRAST:  36mL ISOVUE-370 IOPAMIDOL (ISOVUE-370) INJECTION 76% COMPARISON:  None. FINDINGS: CT HEAD FINDINGS Brain: No acute infarct, hemorrhage, or mass lesion is present. The ventricles are of normal size. No subarachnoid hemorrhage is present. The left parasagittal parietal meningioma is again noted. Vascular: Atherosclerotic calcifications are present in the cavernous internal carotid artery is bilaterally. There is no hyperdense vessel. Skull: The calvarium is intact. No focal lytic or blastic lesion is present. Sinuses: The paranasal sinuses and mastoid air cells are clear. Orbits: Globes and orbits are within normal limits. Bilateral lens replacements are noted. Review of the MIP images confirms the above findings CTA NECK FINDINGS Aortic arch: There is a common origin of the left common carotid artery and the  innominate artery. Atherosclerotic calcifications are present at the origins the great vessels without significant origins stenoses. Calcified and noncalcified plaque narrows the left subclavian artery to 1.8 mm. This compares with a more distal measurement of 6.5 mm. Right carotid system: The right common carotid artery is tortuous. There is some atherosclerotic change distally without significant stenosis. Calcified and noncalcified plaque is present at the right carotid origin without a significant stenosis relative to the more distal vessels. There is tortuosity of the cervical right ICA. Left carotid system: The left common carotid artery demonstrates mild distal narrowing of less than 50%. Calcified and noncalcified plaque is present at the left carotid bifurcation. There is no significant stenosis relative to the more distal vessel. Moderate proximal tortuosity is present in the cervical left ICA without significant stenosis. Vertebral arteries: The right vertebral artery originates from the subclavian artery without significant stenosis. There is proximal tortuosity. The left vertebral artery is occluded proximally with dense calcification at its origin. The slightly more distal thyrocervical trunk is intact. There is faint contrast in the cervical left vertebral artery as low as C6. Contrast is better seen in the more distal left vertebral artery, particularly above the C2-3 junction the vertebral arteries are codominant at the dural margin. Skeleton: Multilevel endplate degenerative changes are most pronounced at C5-6 and C6-7. There is reversal of the normal cervical lordosis. Other neck: A right IJ dialysis catheter is in place. Soft tissues the neck are otherwise unremarkable. No focal mucosal or submucosal lesions are present. Parotid and submandibular glands are within normal limits. Upper chest: The lung apices are clear. The superior mediastinum is within normal limits. Review of the MIP images  confirms the above findings CTA HEAD FINDINGS Anterior circulation: Atherosclerotic calcifications are present in the cavernous internal carotid artery is bilaterally. There is mild narrowing of less than 50% involving the paraophthalmic right ICA and precavernous left ICA. Mild narrowing of the terminal left ICA is less than 50%. Mild irregularity is present in the distal M1 segment without significant stenosis. The A1 and M1 segments are otherwise within normal limits. MCA bifurcations are intact. There is mild irregularity in the ACA and MCA branch vessels distally without significant proximal stenosis or aneurysm. There is no significant stenosis through the ICA terminus on either side. Posterior circulation: The vertebral arteries are codominant at the dural margin. PICA origins are visualized and normal. There is moderate to severe stenosis of the proximal basilar artery with reconstituted hypoplastic caliber more distally. The superior cerebellar artery is are intact. A small P1 segment is present on the right. There is mild narrowing of proximal P2 segments bilaterally. Venous sinuses: The dural sinuses are patent.  Cortical veins are within normal limits. Anatomic variants: Fetal type posterior cerebral artery is bilaterally. Delayed phase: The postcontrast images demonstrate no pathologic enhancement. Review of the MIP images confirms the above findings IMPRESSION: 1. Occluded proximal left vertebral artery with reconstituted flow at the level of C6. This may be retrograde. 2. Hypoplastic distal vertebral artery is not basilar artery with fetal type posterior cerebral arteries bilaterally. 3. Mild narrowing of the distal common internal carotid artery is and at the carotid bifurcations bilaterally without significant stenosis. 4. Mild narrowing within the cavernous internal carotid artery is bilaterally without significant stenosis relative to the more distal vessels. 5. Moderate to high-grade stenosis of  the proximal vertebral artery may be flow limiting given the signal loss on MRI. 6. High-grade stenosis of the left subclavian artery proximal to the left vertebral artery. Electronically Signed   By: San Morelle M.D.   On: 07/14/2017 13:28   Dg Chest 2 View  Result Date: 07/14/2017 CLINICAL DATA:  71 y/o  F; altered mental status. EXAM: CHEST  2 VIEW COMPARISON:  02/10/2017 chest radiograph. FINDINGS: Stable cardiomegaly. Stable right central venous catheter with tip projecting over right atrium. Interstitial pulmonary edema. Small left pleural effusion. Left basilar opacity probably represents associated atelectasis. No acute osseous abnormality is evident. IMPRESSION: Stable cardiomegaly. Interstitial edema. Small left pleural effusion. Left basilar opacity is probably associated atelectasis. Electronically Signed   By: Kristine Garbe M.D.   On: 07/14/2017 01:35   Ct Head Wo Contrast  Result Date: 07/14/2017 CLINICAL DATA:  Extreme hypertension.  Confusion. EXAM: CT HEAD WITHOUT CONTRAST TECHNIQUE: Contiguous axial images were obtained from the base of the skull through the vertex without intravenous contrast. COMPARISON:  MRI of the brain 06/13/2014, head CT 06/10/2014 FINDINGS: Brain: No evidence of acute infarction, hemorrhage, hydrocephalus, extra-axial collection or mass lesion/mass effect. Moderate brain parenchymal volume loss and periventricular microangiopathy. Vascular: Calcific atherosclerotic disease at the skullbase. Skull: Normal. Negative for fracture or focal lesion. Sinuses/Orbits: No acute finding. Other: None. IMPRESSION: No acute intracranial abnormality. Atrophy, chronic microvascular disease. Electronically Signed   By: Fidela Salisbury M.D.   On: 07/14/2017 00:27   Ct Angio Neck W Or Wo Contrast  Result Date: 07/14/2017 CLINICAL DATA:  Right-sided weakness. Abnormal MRA with decreased signal in the left vertebral artery and basilar artery. Possible  stricture or occlusion. Add EXAM: CT ANGIOGRAPHY HEAD AND NECK TECHNIQUE: Multidetector CT imaging of the head and neck was performed using the standard protocol during bolus administration of intravenous contrast. Multiplanar CT image reconstructions and MIPs were obtained to evaluate the vascular anatomy. Carotid stenosis measurements (when applicable) are obtained utilizing NASCET criteria, using the distal internal carotid diameter as the denominator. CONTRAST:  57mL ISOVUE-370 IOPAMIDOL (ISOVUE-370) INJECTION 76% COMPARISON:  None. FINDINGS: CT HEAD FINDINGS Brain: No acute infarct, hemorrhage, or mass lesion is present. The ventricles are of normal size. No subarachnoid hemorrhage is present. The left parasagittal parietal meningioma is again noted. Vascular: Atherosclerotic calcifications are present in the cavernous internal carotid artery is bilaterally. There is no hyperdense vessel. Skull: The calvarium is intact. No focal lytic or blastic lesion is present. Sinuses: The paranasal sinuses and mastoid air cells are clear. Orbits: Globes and orbits are within normal limits. Bilateral lens replacements are noted. Review of the MIP images confirms the above findings CTA NECK FINDINGS Aortic arch: There is a common origin of the left common carotid artery and the innominate artery. Atherosclerotic calcifications are present at the origins the great  vessels without significant origins stenoses. Calcified and noncalcified plaque narrows the left subclavian artery to 1.8 mm. This compares with a more distal measurement of 6.5 mm. Right carotid system: The right common carotid artery is tortuous. There is some atherosclerotic change distally without significant stenosis. Calcified and noncalcified plaque is present at the right carotid origin without a significant stenosis relative to the more distal vessels. There is tortuosity of the cervical right ICA. Left carotid system: The left common carotid artery  demonstrates mild distal narrowing of less than 50%. Calcified and noncalcified plaque is present at the left carotid bifurcation. There is no significant stenosis relative to the more distal vessel. Moderate proximal tortuosity is present in the cervical left ICA without significant stenosis. Vertebral arteries: The right vertebral artery originates from the subclavian artery without significant stenosis. There is proximal tortuosity. The left vertebral artery is occluded proximally with dense calcification at its origin. The slightly more distal thyrocervical trunk is intact. There is faint contrast in the cervical left vertebral artery as low as C6. Contrast is better seen in the more distal left vertebral artery, particularly above the C2-3 junction the vertebral arteries are codominant at the dural margin. Skeleton: Multilevel endplate degenerative changes are most pronounced at C5-6 and C6-7. There is reversal of the normal cervical lordosis. Other neck: A right IJ dialysis catheter is in place. Soft tissues the neck are otherwise unremarkable. No focal mucosal or submucosal lesions are present. Parotid and submandibular glands are within normal limits. Upper chest: The lung apices are clear. The superior mediastinum is within normal limits. Review of the MIP images confirms the above findings CTA HEAD FINDINGS Anterior circulation: Atherosclerotic calcifications are present in the cavernous internal carotid artery is bilaterally. There is mild narrowing of less than 50% involving the paraophthalmic right ICA and precavernous left ICA. Mild narrowing of the terminal left ICA is less than 50%. Mild irregularity is present in the distal M1 segment without significant stenosis. The A1 and M1 segments are otherwise within normal limits. MCA bifurcations are intact. There is mild irregularity in the ACA and MCA branch vessels distally without significant proximal stenosis or aneurysm. There is no significant  stenosis through the ICA terminus on either side. Posterior circulation: The vertebral arteries are codominant at the dural margin. PICA origins are visualized and normal. There is moderate to severe stenosis of the proximal basilar artery with reconstituted hypoplastic caliber more distally. The superior cerebellar artery is are intact. A small P1 segment is present on the right. There is mild narrowing of proximal P2 segments bilaterally. Venous sinuses: The dural sinuses are patent. Cortical veins are within normal limits. Anatomic variants: Fetal type posterior cerebral artery is bilaterally. Delayed phase: The postcontrast images demonstrate no pathologic enhancement. Review of the MIP images confirms the above findings IMPRESSION: 1. Occluded proximal left vertebral artery with reconstituted flow at the level of C6. This may be retrograde. 2. Hypoplastic distal vertebral artery is not basilar artery with fetal type posterior cerebral arteries bilaterally. 3. Mild narrowing of the distal common internal carotid artery is and at the carotid bifurcations bilaterally without significant stenosis. 4. Mild narrowing within the cavernous internal carotid artery is bilaterally without significant stenosis relative to the more distal vessels. 5. Moderate to high-grade stenosis of the proximal vertebral artery may be flow limiting given the signal loss on MRI. 6. High-grade stenosis of the left subclavian artery proximal to the left vertebral artery. Electronically Signed   By: Wynetta Fines.D.  On: 07/14/2017 13:28   Mr Brain Wo Contrast  Result Date: 07/14/2017 CLINICAL DATA:  Altered level of consciousness. EXAM: MRI HEAD WITHOUT CONTRAST MRA HEAD WITHOUT CONTRAST TECHNIQUE: Multiplanar, multiecho pulse sequences of the brain and surrounding structures were obtained without intravenous contrast. Angiographic images of the head were obtained using MRA technique without contrast. COMPARISON:  06/13/2014  FINDINGS: MRI HEAD FINDINGS Brain: No acute infarction, hemorrhage, hydrocephalus, extra-axial collection. Mild cerebral white matter disease consistent with chronic small vessel ischemia given patient's risk factors. T2 isointense mass along the high left parietal convexity measuring 26 mm in diameter by 7 mm in thickness, essentially stable from 2015 and consistent with meningioma. Mild local cortical mass effect. Vascular: Arterial findings below. Normal dural venous sinus flow voids. Skull and upper cervical spine: No focal marrow lesion. Stable mildly hypointense cervical spine marrow compared to prior, potentially from end-stage renal disease status. Sinuses/Orbits: Bilateral cataract resection. MRA HEAD FINDINGS There is poor or narrow flow in the left vertebral artery not entirely explained by tortuous course. This vessel does appear patent on T2 weighted imaging flow voids. There is a patent right vertebral artery the proximal basilar. Very poor signal throughout the basilar. Although there are fetal type bilateral posterior cerebral arteries mid basilar has lost its flow void on T2 weighted imaging, new from 01/11/2014 brain MRI (no previous angiography). Symmetric carotid arteries. Atheromatous type irregularity of the bilateral carotid siphons. No flow limiting stenosis in the anterior circulation. No branch occlusion. Negative for aneurysm. Bulbous appearance of the proximal right pica is attributed to tortuous vessel. These results were called by telephone at the time of interpretation on 07/14/2017 at 10:21 am to Dr. Aline August , who verbally acknowledged these results. IMPRESSION: Brain MRI: 1. No acute finding including infarct. 2. Mild chronic small vessel ischemia. 3. Known left parietal meningioma with mild cortical mass effect, essentially stable from 2015. Intracranial MRA: Poor signal in the left vertebral and basilar arteries that appears new from 2015. If basilar thrombosis is a clinical  possibility suggest CTA head and neck. Electronically Signed   By: Monte Fantasia M.D.   On: 07/14/2017 10:24   Mr Jodene Nam Head Wo Contrast  Result Date: 07/14/2017 CLINICAL DATA:  Altered level of consciousness. EXAM: MRI HEAD WITHOUT CONTRAST MRA HEAD WITHOUT CONTRAST TECHNIQUE: Multiplanar, multiecho pulse sequences of the brain and surrounding structures were obtained without intravenous contrast. Angiographic images of the head were obtained using MRA technique without contrast. COMPARISON:  06/13/2014 FINDINGS: MRI HEAD FINDINGS Brain: No acute infarction, hemorrhage, hydrocephalus, extra-axial collection. Mild cerebral white matter disease consistent with chronic small vessel ischemia given patient's risk factors. T2 isointense mass along the high left parietal convexity measuring 26 mm in diameter by 7 mm in thickness, essentially stable from 2015 and consistent with meningioma. Mild local cortical mass effect. Vascular: Arterial findings below. Normal dural venous sinus flow voids. Skull and upper cervical spine: No focal marrow lesion. Stable mildly hypointense cervical spine marrow compared to prior, potentially from end-stage renal disease status. Sinuses/Orbits: Bilateral cataract resection. MRA HEAD FINDINGS There is poor or narrow flow in the left vertebral artery not entirely explained by tortuous course. This vessel does appear patent on T2 weighted imaging flow voids. There is a patent right vertebral artery the proximal basilar. Very poor signal throughout the basilar. Although there are fetal type bilateral posterior cerebral arteries mid basilar has lost its flow void on T2 weighted imaging, new from 01/11/2014 brain MRI (no previous angiography). Symmetric carotid  arteries. Atheromatous type irregularity of the bilateral carotid siphons. No flow limiting stenosis in the anterior circulation. No branch occlusion. Negative for aneurysm. Bulbous appearance of the proximal right pica is attributed  to tortuous vessel. These results were called by telephone at the time of interpretation on 07/14/2017 at 10:21 am to Dr. Aline August , who verbally acknowledged these results. IMPRESSION: Brain MRI: 1. No acute finding including infarct. 2. Mild chronic small vessel ischemia. 3. Known left parietal meningioma with mild cortical mass effect, essentially stable from 2015. Intracranial MRA: Poor signal in the left vertebral and basilar arteries that appears new from 2015. If basilar thrombosis is a clinical possibility suggest CTA head and neck. Electronically Signed   By: Monte Fantasia M.D.   On: 07/14/2017 10:24     Echo on 07/15/2017 Study Conclusions  - Left ventricle: The cavity size was normal. Wall thickness was increased in a pattern of mild LVH. Systolic function was normal. The estimated ejection fraction was in the range of 60% to 65%. Wall motion was normal; there were no regional wall motion abnormalities. Doppler parameters are consistent with abnormal left ventricular relaxation (grade 1 diastolic dysfunction). Doppler parameters are consistent with high ventricular filling pressure. - Aortic valve: Valve area (VTI): 2.13 cm^2. Valve area (Vmax): 2.15 cm^2. Valve area (Vmean): 2.01 cm^2.   Subjective: Patient seen and examined at bedside.  She is awake and answering questions.  No overnight fever or vomiting.  She is tolerating diet. Discharge Exam: Vitals:   07/19/17 0601 07/19/17 0832  BP: (!) 115/50 139/67  Pulse: 68 70  Resp: 17 (!) 22  Temp: 98.4 F (36.9 C) 98 F (36.7 C)  SpO2: 100% 98%   Vitals:   07/18/17 2241 07/19/17 0204 07/19/17 0601 07/19/17 0832  BP: 128/70 (!) 131/56 (!) 115/50 139/67  Pulse: (!) 57 70 68 70  Resp:  18 17 (!) 22  Temp:  98 F (36.7 C) 98.4 F (36.9 C) 98 F (36.7 C)  TempSrc:  Oral Oral Oral  SpO2: 99% 98% 100% 98%  Weight:      Height:        General: Pt is alert, awake, not in acute  distress Cardiovascular: Rate controlled, S1/S2 + Respiratory: Bilateral decreased breath sounds at bases Abdominal: Soft, NT, ND, bowel sounds + Extremities: no edema, no cyanosis    The results of significant diagnostics from this hospitalization (including imaging, microbiology, ancillary and laboratory) are listed below for reference.     Microbiology: Recent Results (from the past 240 hour(s))  MRSA PCR Screening     Status: None   Collection Time: 07/14/17  7:03 PM  Result Value Ref Range Status   MRSA by PCR NEGATIVE NEGATIVE Final    Comment:        The GeneXpert MRSA Assay (FDA approved for NASAL specimens only), is one component of a comprehensive MRSA colonization surveillance program. It is not intended to diagnose MRSA infection nor to guide or monitor treatment for MRSA infections.      Labs: BNP (last 3 results) Recent Labs    02/07/17 0029  BNP 557.3*   Basic Metabolic Panel: Recent Labs  Lab 07/13/17 2335 07/14/17 1041 07/16/17 0243 07/17/17 0228 07/18/17 0237  NA 136 136 138 133* 129*  K 4.7 5.6* 4.0 4.9 5.6*  CL 95* 99* 101 96* 95*  CO2 25 21* 24 25 22   GLUCOSE 121* 119* 166* 169* 148*  BUN 46* 53* 14 35* 50*  CREATININE  5.82* 6.17* 3.17* 4.49* 5.68*  CALCIUM 7.8* 7.8* 8.4* 8.6* 8.4*  MG  --   --  2.0 2.3 2.1   Liver Function Tests: Recent Labs  Lab 07/13/17 2335 07/14/17 1041  AST 20 21  ALT 10* 11*  ALKPHOS 146* 141*  BILITOT 0.8 0.5  PROT 6.7 6.4*  ALBUMIN 3.5 3.3*   No results for input(s): LIPASE, AMYLASE in the last 168 hours. No results for input(s): AMMONIA in the last 168 hours. CBC: Recent Labs  Lab 07/13/17 2335 07/14/17 1041 07/16/17 0243 07/17/17 0228  WBC 4.8 4.7 4.8 5.5  NEUTROABS 2.5  --  2.6 2.3  HGB 13.3 13.6 12.7 12.4  HCT 42.0 42.6 39.7 41.0  MCV 95.0 94.9 95.9 96.9  PLT 220 234 189 185   Cardiac Enzymes: No results for input(s): CKTOTAL, CKMB, CKMBINDEX, TROPONINI in the last 168  hours. BNP: Invalid input(s): POCBNP CBG: Recent Labs  Lab 07/17/17 2214 07/18/17 1219 07/18/17 1646 07/18/17 2104 07/19/17 0811  GLUCAP 183* 96 193* 184* 112*   D-Dimer No results for input(s): DDIMER in the last 72 hours. Hgb A1c No results for input(s): HGBA1C in the last 72 hours. Lipid Profile No results for input(s): CHOL, HDL, LDLCALC, TRIG, CHOLHDL, LDLDIRECT in the last 72 hours. Thyroid function studies No results for input(s): TSH, T4TOTAL, T3FREE, THYROIDAB in the last 72 hours.  Invalid input(s): FREET3 Anemia work up No results for input(s): VITAMINB12, FOLATE, FERRITIN, TIBC, IRON, RETICCTPCT in the last 72 hours. Urinalysis    Component Value Date/Time   COLORURINE YELLOW 07/13/2017 2335   APPEARANCEUR CLEAR 07/13/2017 2335   LABSPEC 1.009 07/13/2017 2335   PHURINE 6.0 07/13/2017 2335   GLUCOSEU NEGATIVE 07/13/2017 2335   HGBUR NEGATIVE 07/13/2017 2335   BILIRUBINUR NEGATIVE 07/13/2017 2335   KETONESUR NEGATIVE 07/13/2017 2335   PROTEINUR 100 (A) 07/13/2017 2335   UROBILINOGEN 0.2 06/11/2014 0639   NITRITE NEGATIVE 07/13/2017 2335   LEUKOCYTESUR NEGATIVE 07/13/2017 2335   Sepsis Labs Invalid input(s): PROCALCITONIN,  WBC,  LACTICIDVEN Microbiology Recent Results (from the past 240 hour(s))  MRSA PCR Screening     Status: None   Collection Time: 07/14/17  7:03 PM  Result Value Ref Range Status   MRSA by PCR NEGATIVE NEGATIVE Final    Comment:        The GeneXpert MRSA Assay (FDA approved for NASAL specimens only), is one component of a comprehensive MRSA colonization surveillance program. It is not intended to diagnose MRSA infection nor to guide or monitor treatment for MRSA infections.      Time coordinating discharge: 35 minutes  SIGNED:   Aline August, MD  [facility] 07/19/2017, 11:27 AM Pager: [phone]  If 7PM-7AM, please contact night-coverage [email or web address] Password [email or web address]

## 2017-07-19 NOTE — Progress Notes (Signed)
Physical Therapy Treatment Patient Details Name: Kathleen [name] MRN: [id number] DOB: Apr 03, 1946 Today's Date: 07/19/2017    History of Present Illness 71 year old female with history of hypertension, hyperlipidemia, diabetes mellitus type 2, end-stage renal disease on hemodialysis and coronary artery disease presented with altered mental status and elevated blood pressure.  MRI of the brain was negative for acute stroke.    PT Comments    Patient progressing very well, now demonstrating increased activity tolerance and increase in independence. Able to ambulate safely further distances than prior sessions and perform stair training. Discussed safety considerations with patient who has no questions or concerns at this time. Given level of support patient will have and progression, now recommending HHPT to maximize safety.   Follow Up Recommendations  Home health PT;Supervision for mobility/OOB     Equipment Recommendations  None recommended by PT    Recommendations for Other Services       Precautions / Restrictions Precautions Precautions: Fall Restrictions Weight Bearing Restrictions: No    Mobility  Bed Mobility               General bed mobility comments: OOB in chair at entry  Transfers Overall transfer level: Needs assistance Equipment used: Rolling walker (2 wheeled) Transfers: Sit to/from Stand Sit to Stand: Min guard         General transfer comment: min guard for safety  Ambulation/Gait Ambulation/Gait assistance: Min guard Ambulation Distance (Feet): 200 Feet Assistive device: Rolling walker (2 wheeled) Gait Pattern/deviations: WFL(Within Functional Limits) Gait velocity: decreased   General Gait Details: Cues for safe distance with RW, min guard for safety.    Stairs Stairs: Yes   Stair Management: One rail Left(1 rail, 1 hand assist BUE support) Number of Stairs: 2 General stair comments: min A with BUE support, patient reports stair at  home is shorter and has family support to assist in.   Wheelchair Mobility    Modified Rankin (Stroke Patients Only)       Balance Overall balance assessment: No apparent balance deficits (not formally assessed)                                          Cognition Arousal/Alertness: Awake/alert Behavior During Therapy: WFL for tasks assessed/performed Overall Cognitive Status: Within Functional Limits for tasks assessed Area of Impairment: Orientation;Attention;Following commands;Safety/judgement;Awareness;Problem solving                             Problem Solving: Slow processing;Decreased initiation;Requires tactile cues        Exercises      General Comments        Pertinent Vitals/Pain Pain Assessment: Faces Faces Pain Scale: Hurts a little bit Pain Location: BLE Knee arthritis Pain Descriptors / Indicators: Aching Pain Intervention(s): Limited activity within patient's tolerance;Monitored during session    Home Living                      Prior Function            PT Goals (current goals can now be found in the care plan section) Acute Rehab PT Goals Patient Stated Goal: to go home today PT Goal Formulation: With patient Time For Goal Achievement: 07/29/17 Potential to Achieve Goals: Good Progress towards PT goals: Progressing toward goals    Frequency    Min  3X/week      PT Plan Discharge plan needs to be updated    Co-evaluation              AM-PAC PT "6 Clicks" Daily Activity  Outcome Measure  Difficulty turning over in bed (including adjusting bedclothes, sheets and blankets)?: A Little Difficulty moving from lying on back to sitting on the side of the bed? : A Little Difficulty sitting down on and standing up from a chair with arms (e.g., wheelchair, bedside commode, etc,.)?: A Little Help needed moving to and from a bed to chair (including a wheelchair)?: A Little Help needed walking in  hospital room?: A Little Help needed climbing 3-5 steps with a railing? : A Lot 6 Click Score: 17    End of Session Equipment Utilized During Treatment: Gait belt Activity Tolerance: Patient tolerated treatment well Patient left: in chair;with call bell/phone within reach Nurse Communication: Mobility status PT Visit Diagnosis: Unsteadiness on feet (R26.81);Muscle weakness (generalized) (M62.81)     Time: 1520-1540 PT Time Calculation (min) (ACUTE ONLY): 20 min  Charges:  $Gait Training: 8-22 mins                    G Codes:       Reinaldo Berber, PT, DPT Acute Rehab Services Pager: [phone]     Reinaldo Berber 07/19/2017, 4:06 PM

## 2017-07-19 NOTE — Progress Notes (Signed)
CSW went in to discharge patient. Patient states she is feeling stronger and would like to discharge home with [place] notified RNCM and RN. Patient's son coming to pick her up.  CSW signing off.  Percell Locus Davonne Jarnigan LCSWA [phone]

## 2017-07-19 NOTE — Clinical Social Work Note (Signed)
Clinical Social Work Assessment  Patient Details  Name: Kathleen [name] MRN: [id number] Date of Birth: 1945/09/22  Date of referral:  07/19/17               Reason for consult:  Facility Placement                Permission sought to share information with:  Facility Art therapist granted to share information::  Yes, Verbal Permission Granted  Name::        Agency::  SNFs  Relationship::     Contact Information:     Housing/Transportation Living arrangements for the past 2 months:  [place] of Information:  Patient Patient Interpreter Needed:  None Criminal Activity/Legal Involvement Pertinent to Current Situation/Hospitalization:  No - Comment as needed Significant Relationships:  Other(Comment), Adult Children(Ex husband) Lives with:  Self Do you feel safe going back to the place where you live?  No Need for family participation in patient care:  Yes (Comment)  Care giving concerns:  CSW received consult for possible SNF placement at time of discharge. CSW spoke with patient regarding PT recommendation of SNF placement at time of discharge. Patient reported that she lives alone and her children work and are currently unable to care for patient at their home given patient's current physical needs and fall risk. Patient expressed understanding of PT recommendation and is agreeable to SNF placement at time of discharge. CSW to continue to follow and assist with discharge planning needs.   Social Worker assessment / plan:  CSW spoke with patient concerning possibility of rehab at [facility] before returning home.  Employment status:  Retired Nurse, adult PT Recommendations:  [place] / Referral to community resources:  [place]  Patient/Family's Response to care:  Patient recognizes need for rehab before returning home and is agreeable to a SNF in [place]. Patient  reported preference for [facility] since she has been there several times in the past and she likes their food. CSW explained that [facility] is unable to provide transportation to her dialysis appointments. She states that her ex-husband always takes her to her appointments and that they are good friends.   Patient/Family's Understanding of and Emotional Response to Diagnosis, Current Treatment, and Prognosis:  Patient/family is realistic regarding therapy needs and expressed being hopeful for SNF placement. Patient expressed understanding of CSW role and discharge process as well as medical condition. No questions/concerns about plan or treatment.    Emotional Assessment Appearance:  Appears stated age Attitude/Demeanor/Rapport:  Other(Appropriate) Affect (typically observed):  Accepting, Appropriate, Pleasant Orientation:  Oriented to Self, Oriented to Situation, Oriented to Place, Oriented to  Time Alcohol / Substance use:  Not Applicable Psych involvement (Current and /or in the community):  No (Comment)  Discharge Needs  Concerns to be addressed:  Care Coordination Readmission within the last 30 days:  No Current discharge risk:  None Barriers to Discharge:  No Barriers Identified   Benard Halsted, [place] 07/19/2017, 11:21 AM

## 2017-07-19 NOTE — Care Management Important Message (Signed)
Important Message  Patient Details  Name: Kathleen [name] MRN: [id number] Date of Birth: [birth year]   Medicare Important Message Given:  Yes    Fortune Brannigan 07/19/2017, 9:30 AM

## 2017-07-19 NOTE — NC FL2 (Signed)
[place] LEVEL OF CARE SCREENING TOOL     IDENTIFICATION  Patient Name: Kathleen [name] Birthdate: 11/16/1945 Sex: female Admission Date (Current Location): 07/13/2017  [facility] and [place] Number:  Herbalist and Address:  [facility] [place] [address], [place], [place] [address]      Provider Number: [id number]  Attending Physician Name and Address:  Aline August, MD  Relative Name and Phone Number:       Current Level of Care: Hospital Recommended Level of Care: [place] Prior Approval Number:    Date Approved/Denied:   PASRR Number: [id number]  Discharge Plan: SNF    Current Diagnoses: Patient Active Problem List   Diagnosis Date Noted  . Altered mental status 07/14/2017  . End-stage renal disease on hemodialysis ([place])   . Knee pain   . Acute lower UTI   . ESRD (end stage renal disease) ([place]) 02/15/2017  . Peripheral arterial disease ([place]) 02/23/2016  . Insulin dependent diabetes mellitus ([place]) 12/18/2015  . Anemia in chronic kidney disease 12/18/2015  . Acute renal failure superimposed on stage 4 chronic kidney disease ([place]) 08/13/2015  . Dyspnea   . Goals of care, counseling/discussion   . SOB (shortness of breath)   . Encounter for palliative care   . Pulmonary vascular congestion   . Pleural effusion, left   . Acute on chronic renal failure ([place])   . HLD (hyperlipidemia)   . Noncompliance with medication regimen   . Disorientation   . Somnolence   . Acute respiratory failure with hypercapnia ([place])   . Respiratory acidosis   . Hypernatremia   . Systolic and diastolic CHF, acute ([place])   . CHF, acute on chronic ([place]) 08/05/2015  . Acute renal failure superimposed on stage 3 chronic kidney disease ([place]) 08/05/2015  . Elevated troponin 08/05/2015  . CHF (congestive heart failure) ([place]) 08/05/2015  . Acute on chronic heart failure ([place]) 08/05/2015  . Acute on chronic combined systolic and  diastolic congestive heart failure ([place])   . AKI (acute kidney injury) ([place])   . Left carotid bruit 11/20/2014  . Syncope and collapse 06/10/2014  . Syncope 06/10/2014  . Essential hypertension   . Unspecified vitamin D deficiency   . Cellulitis and abscess of right lower extremitiy 02/28/2014  . Chronic combined systolic and diastolic heart failure ([place]) 02/12/2014  . CAD (coronary artery disease)   . Obesity   . Meningioma ([place])   . Anemia   . CKD stage 4 due to type 2 diabetes mellitus ([place]) 01/24/2014  . Hyperlipidemia 01/22/2014  . BPPV (benign paroxysmal positional vertigo) 01/22/2014  . Hypertensive urgency 01/21/2014  . Tobacco abuse 01/21/2014  . Diabetes mellitus due to abnormal insulin ([place]) 01/21/2014    Orientation RESPIRATION BLADDER Height & Weight     Self, Time, Situation, Place  Normal Continent Weight: 79.6 kg (175 lb 7.8 oz) Height:  5\' 3"  (160 cm)  BEHAVIORAL SYMPTOMS/MOOD NEUROLOGICAL BOWEL NUTRITION STATUS      Continent Diet(Please see DC Summary)  AMBULATORY STATUS COMMUNICATION OF NEEDS Skin   Limited Assist Verbally Normal                       Personal Care Assistance Level of Assistance  Bathing, Feeding, Dressing Bathing Assistance: Limited assistance Feeding assistance: Independent Dressing Assistance: Limited assistance     Functional Limitations Info  Sight, Hearing, Speech Sight Info: Adequate Hearing Info: Adequate Speech Info: Adequate  SPECIAL CARE FACTORS FREQUENCY  PT (By licensed PT)     PT Frequency: 5x/week              Contractures      Additional Factors Info  Code Status Code Status Info: Full Allergies Info: Baclofen           Current Medications (07/19/2017):  This is the current hospital active medication list Current Facility-Administered Medications  Medication Dose Route Frequency Provider Last Rate Last Dose  .  stroke: mapping our early stages of recovery book   Does not apply Once Jani Gravel, MD      . acetaminophen (TYLENOL) tablet 650 mg  650 mg Oral Q4H PRN Jani Gravel, MD   650 mg at 07/17/17 2221   Or  . acetaminophen (TYLENOL) solution 650 mg  650 mg Per Tube Q4H PRN Jani Gravel, MD       Or  . acetaminophen (TYLENOL) suppository 650 mg  650 mg Rectal Q4H PRN Jani Gravel, MD      . aspirin EC tablet 81 mg  81 mg Oral Daily Aline August, MD   81 mg at 07/19/17 0919  . atorvastatin (LIPITOR) tablet 40 mg  40 mg Oral q1800 Aline August, MD   40 mg at 07/18/17 1722  . brimonidine (ALPHAGAN) 0.2 % ophthalmic solution 1 drop  1 drop Right Eye BID PRN Aline August, MD       And  . timolol (TIMOPTIC) 0.5 % ophthalmic solution 1 drop  1 drop Right Eye BID PRN Aline August, MD      . calcium acetate (PHOSLO) capsule 2,001 mg  2,001 mg Oral TID WC Lynnda Child, PA-C   2,001 mg at 07/19/17 0919  . carvedilol (COREG) tablet 25 mg  25 mg Oral BID Aline August, MD   25 mg at 07/19/17 0920  . doxercalciferol (HECTOROL) injection 4 mcg  4 mcg Intravenous Q T,Th,Sa-HD Lynnda Child, PA-C   4 mcg at 07/18/17 1120  . hydrALAZINE (APRESOLINE) tablet 25 mg  25 mg Oral Q8H PRN Jani Gravel, MD      . insulin aspart (novoLOG) injection 0-5 Units  0-5 Units Subcutaneous QHS Alekh, Kshitiz, MD      . insulin aspart (novoLOG) injection 0-9 Units  0-9 Units Subcutaneous TID WC Aline August, MD   2 Units at 07/18/17 1723  . isosorbide mononitrate (IMDUR) 24 hr tablet 30 mg  30 mg Oral Daily Starla Link, Kshitiz, MD   30 mg at 07/19/17 0919  . loratadine (CLARITIN) tablet 10 mg  10 mg Oral Daily Georgette Shell, MD   10 mg at 07/19/17 0920  . multivitamin (RENA-VIT) tablet 1 tablet  1 tablet Oral QHS Jani Gravel, MD   1 tablet at 07/18/17 2235  . polyethylene glycol (MIRALAX / GLYCOLAX) packet 17 g  17 g Oral Daily Jani Gravel, MD   17 g at 07/16/17 0932     Discharge Medications: Please see discharge summary for a list of discharge medications.  Relevant Imaging  Results:  Relevant Lab Results:   Additional Information SS#: [id number]. Gets HD TTS at [facility] [place]

## 2017-07-19 NOTE — Care Management Note (Signed)
Case Management Note  Patient Details  Name: GENOA FREYRE MRN: [id number] Date of Birth: 03-19-1946  Subjective/Objective:  AMS                  Action/Plan: Transition to home today. Declined SNF placement. Son to provide transportation to home.  Expected Discharge Date:  07/19/17               Expected Discharge Plan:  [place]  In-House Referral:  Clinical Social Work  Discharge planning Services  CM Consult  Post Acute Care Choice:    Choice offered to:  Patient  DME Arranged:    DME Agency:     HH Arranged:  RN,PT [place] Agency:  [place]  Status of Service:  Completed, signed off  If discussed at [place] of Stay Meetings, dates discussed:    Additional Comments:  Sharin Mons, RN 07/19/2017, 3:52 PM

## 2017-07-20 DIAGNOSIS — D631 Anemia in chronic kidney disease: Secondary | ICD-10-CM | POA: Diagnosis not present

## 2017-07-20 DIAGNOSIS — E1129 Type 2 diabetes mellitus with other diabetic kidney complication: Secondary | ICD-10-CM | POA: Diagnosis not present

## 2017-07-20 DIAGNOSIS — N186 End stage renal disease: Secondary | ICD-10-CM | POA: Diagnosis not present

## 2017-07-20 DIAGNOSIS — D509 Iron deficiency anemia, unspecified: Secondary | ICD-10-CM | POA: Diagnosis not present

## 2017-07-20 DIAGNOSIS — T8249XD Other complication of vascular dialysis catheter, subsequent encounter: Secondary | ICD-10-CM | POA: Diagnosis not present

## 2017-07-20 DIAGNOSIS — N2581 Secondary hyperparathyroidism of renal origin: Secondary | ICD-10-CM | POA: Diagnosis not present

## 2017-07-20 DIAGNOSIS — D689 Coagulation defect, unspecified: Secondary | ICD-10-CM | POA: Diagnosis not present

## 2017-07-22 DIAGNOSIS — N2581 Secondary hyperparathyroidism of renal origin: Secondary | ICD-10-CM | POA: Diagnosis not present

## 2017-07-22 DIAGNOSIS — D631 Anemia in chronic kidney disease: Secondary | ICD-10-CM | POA: Diagnosis not present

## 2017-07-22 DIAGNOSIS — T8249XD Other complication of vascular dialysis catheter, subsequent encounter: Secondary | ICD-10-CM | POA: Diagnosis not present

## 2017-07-22 DIAGNOSIS — N186 End stage renal disease: Secondary | ICD-10-CM | POA: Diagnosis not present

## 2017-07-22 DIAGNOSIS — D509 Iron deficiency anemia, unspecified: Secondary | ICD-10-CM | POA: Diagnosis not present

## 2017-07-22 DIAGNOSIS — E1129 Type 2 diabetes mellitus with other diabetic kidney complication: Secondary | ICD-10-CM | POA: Diagnosis not present

## 2017-07-22 DIAGNOSIS — D689 Coagulation defect, unspecified: Secondary | ICD-10-CM | POA: Diagnosis not present

## 2017-07-24 DIAGNOSIS — R51 Headache: Secondary | ICD-10-CM | POA: Diagnosis not present

## 2017-07-24 DIAGNOSIS — E119 Type 2 diabetes mellitus without complications: Secondary | ICD-10-CM | POA: Diagnosis not present

## 2017-07-24 DIAGNOSIS — R0989 Other specified symptoms and signs involving the circulatory and respiratory systems: Secondary | ICD-10-CM | POA: Diagnosis not present

## 2017-07-24 DIAGNOSIS — Z09 Encounter for follow-up examination after completed treatment for conditions other than malignant neoplasm: Secondary | ICD-10-CM | POA: Diagnosis not present

## 2017-07-24 DIAGNOSIS — I10 Essential (primary) hypertension: Secondary | ICD-10-CM | POA: Diagnosis not present

## 2017-07-25 DIAGNOSIS — N2581 Secondary hyperparathyroidism of renal origin: Secondary | ICD-10-CM | POA: Diagnosis not present

## 2017-07-25 DIAGNOSIS — N186 End stage renal disease: Secondary | ICD-10-CM | POA: Diagnosis not present

## 2017-07-25 DIAGNOSIS — D689 Coagulation defect, unspecified: Secondary | ICD-10-CM | POA: Diagnosis not present

## 2017-07-25 DIAGNOSIS — T8249XD Other complication of vascular dialysis catheter, subsequent encounter: Secondary | ICD-10-CM | POA: Diagnosis not present

## 2017-07-25 DIAGNOSIS — E1129 Type 2 diabetes mellitus with other diabetic kidney complication: Secondary | ICD-10-CM | POA: Diagnosis not present

## 2017-07-25 DIAGNOSIS — D631 Anemia in chronic kidney disease: Secondary | ICD-10-CM | POA: Diagnosis not present

## 2017-07-25 DIAGNOSIS — D509 Iron deficiency anemia, unspecified: Secondary | ICD-10-CM | POA: Diagnosis not present

## 2017-07-27 DIAGNOSIS — T8249XD Other complication of vascular dialysis catheter, subsequent encounter: Secondary | ICD-10-CM | POA: Diagnosis not present

## 2017-07-27 DIAGNOSIS — D689 Coagulation defect, unspecified: Secondary | ICD-10-CM | POA: Diagnosis not present

## 2017-07-27 DIAGNOSIS — E1129 Type 2 diabetes mellitus with other diabetic kidney complication: Secondary | ICD-10-CM | POA: Diagnosis not present

## 2017-07-27 DIAGNOSIS — N2581 Secondary hyperparathyroidism of renal origin: Secondary | ICD-10-CM | POA: Diagnosis not present

## 2017-07-27 DIAGNOSIS — D509 Iron deficiency anemia, unspecified: Secondary | ICD-10-CM | POA: Diagnosis not present

## 2017-07-27 DIAGNOSIS — D631 Anemia in chronic kidney disease: Secondary | ICD-10-CM | POA: Diagnosis not present

## 2017-07-27 DIAGNOSIS — N186 End stage renal disease: Secondary | ICD-10-CM | POA: Diagnosis not present

## 2017-07-29 DIAGNOSIS — D509 Iron deficiency anemia, unspecified: Secondary | ICD-10-CM | POA: Diagnosis not present

## 2017-07-29 DIAGNOSIS — E1129 Type 2 diabetes mellitus with other diabetic kidney complication: Secondary | ICD-10-CM | POA: Diagnosis not present

## 2017-07-29 DIAGNOSIS — T8249XD Other complication of vascular dialysis catheter, subsequent encounter: Secondary | ICD-10-CM | POA: Diagnosis not present

## 2017-07-29 DIAGNOSIS — D631 Anemia in chronic kidney disease: Secondary | ICD-10-CM | POA: Diagnosis not present

## 2017-07-29 DIAGNOSIS — N2581 Secondary hyperparathyroidism of renal origin: Secondary | ICD-10-CM | POA: Diagnosis not present

## 2017-07-29 DIAGNOSIS — N186 End stage renal disease: Secondary | ICD-10-CM | POA: Diagnosis not present

## 2017-07-29 DIAGNOSIS — D689 Coagulation defect, unspecified: Secondary | ICD-10-CM | POA: Diagnosis not present

## 2017-07-31 DIAGNOSIS — D689 Coagulation defect, unspecified: Secondary | ICD-10-CM | POA: Diagnosis not present

## 2017-07-31 DIAGNOSIS — E1129 Type 2 diabetes mellitus with other diabetic kidney complication: Secondary | ICD-10-CM | POA: Diagnosis not present

## 2017-07-31 DIAGNOSIS — D509 Iron deficiency anemia, unspecified: Secondary | ICD-10-CM | POA: Diagnosis not present

## 2017-07-31 DIAGNOSIS — D631 Anemia in chronic kidney disease: Secondary | ICD-10-CM | POA: Diagnosis not present

## 2017-07-31 DIAGNOSIS — N186 End stage renal disease: Secondary | ICD-10-CM | POA: Diagnosis not present

## 2017-07-31 DIAGNOSIS — T8249XD Other complication of vascular dialysis catheter, subsequent encounter: Secondary | ICD-10-CM | POA: Diagnosis not present

## 2017-07-31 DIAGNOSIS — N2581 Secondary hyperparathyroidism of renal origin: Secondary | ICD-10-CM | POA: Diagnosis not present

## 2017-08-02 NOTE — Progress Notes (Signed)
  Established Dialysis Access   History of Present Illness   Kathleen [name] is a 71 y.o. (10/26/1945) female who presents for cc: difficulty cannulating L BC AVF.  Pt has had multiple infilitrations to date and continued HD via [facility].  Dr. Fields recommended superficialization of the fistula in his notes on 07/14/17.  The patient denies any steal sx.  Past Medical History:  Diagnosis Date  . Anemia    a. 01/2014: suspected of chronic disease, negative FOBT.  . CAD (coronary artery disease)    a. Cath 01/2014: mild in LAD, mild-mod LCx, mod-severe RCA - for med rx.  . CKD (chronic kidney disease), stage III (HCC)    a. Borderline III-IV.  . Diabetes mellitus without complication (HCC)   . History of echocardiogram    Echo (10/15):  Mod LVH, EF 50-55%, Gr 1 DD, MAC, mild MR, mild TR, PASP 35 mmHg  . Hyperlipidemia   . Hypertension   . Meningioma (HCC)    a. Incidental dx 01/2014.  . Obesity   . Systolic CHF (HCC)    a. 01/2014: dx with mixed ICM/NICM (out of proportion to CAD) EF 25-30% by echo.     Past Surgical History:  Procedure Laterality Date  . ABDOMINAL HYSTERECTOMY    . AV FISTULA PLACEMENT Left 02/10/2017   Procedure: ARTERIOVENOUS (AV) FISTULA CREATION-LEFT FOREARM;  Surgeon: Fields, Charles E, MD;  Location: [facility] OR;  Service: Vascular;  Laterality: Left;  . BREAST SURGERY    . INSERTION OF DIALYSIS CATHETER Right 02/10/2017   Procedure: INSERTION OF DIALYSIS CATHETER;  Surgeon: Fields, Charles E, MD;  Location: [facility] OR;  Service: Vascular;  Laterality: Right;  . LEFT HEART CATHETERIZATION WITH CORONARY ANGIOGRAM N/A 01/27/2014   Procedure: LEFT HEART CATHETERIZATION WITH CORONARY ANGIOGRAM;  Surgeon: Jayadeep S Varanasi, MD;  Location: [facility] CATH LAB;  Service: Cardiovascular;  Laterality: N/A;  . TONSILLECTOMY      Social History   Socioeconomic History  . Marital status: Divorced    Spouse name: Not on file  . Number of children: Not on file  . Years of education: Not on  file  . Highest education level: Not on file  Social Needs  . Financial resource strain: Not on file  . Food insecurity - worry: Not on file  . Food insecurity - inability: Not on file  . Transportation needs - medical: Not on file  . Transportation needs - non-medical: Not on file  Occupational History  . Not on file  Tobacco Use  . Smoking status: Former Smoker    Packs/day: 0.25    Years: 30.00    Pack years: 7.50    Types: Cigarettes  . Smokeless tobacco: Never Used  Substance and Sexual Activity  . Alcohol use: No  . Drug use: No  . Sexual activity: No  Other Topics Concern  . Not on file  Social History Narrative  . Not on file    Family History  Problem Relation Age of Onset  . Diabetic kidney disease Mother   . Hypertension Mother   . Heart failure Mother   . Heart attack Mother   . Hyperlipidemia Mother   . Hypertension Father     Current Outpatient Medications  Medication Sig Dispense Refill  . aspirin 81 MG EC tablet Take 1 tablet (81 mg total) by mouth daily. 30 tablet 3  . atorvastatin (LIPITOR) 40 MG tablet Take 1 tablet (40 mg total) by mouth daily at 6 PM. 30   tablet 0  . brimonidine-timolol (COMBIGAN) 0.2-0.5 % ophthalmic solution Place 1 drop into the right eye 2 (two) times daily as needed (for glaucoma).     . calcitRIOL (ROCALTROL) 0.5 MCG capsule Take 1 capsule (0.5 mcg total) by mouth Every Tuesday,Thursday,and Saturday with dialysis.    . calcium acetate (PHOSLO) 667 MG capsule Take 667 mg by mouth 3 (three) times daily with meals.    . carvedilol (COREG) 25 MG tablet Take 25 mg by mouth 2 (two) times daily.  3  . hydrALAZINE (APRESOLINE) 25 MG tablet Take 1 tablet (25 mg total) by mouth every 8 (eight) hours as needed (SBP > 170  or DBP > 110). 90 tablet 0  . insulin detemir (LEVEMIR) 100 UNIT/ML injection Inject 0.15 mLs (15 Units total) into the skin at bedtime. (Patient taking differently: Inject 30 Units into the skin at bedtime. ) 10 mL 11    . multivitamin (RENA-VIT) TABS tablet Take 1 tablet by mouth at bedtime. 30 tablet 0  . polyethylene glycol (MIRALAX / GLYCOLAX) packet Take 17 g by mouth daily. 14 each 0   No current facility-administered medications for this visit.      Allergies  Allergen Reactions  . Baclofen     Severe delirium when given to pateint in Dec 2018.     REVIEW OF SYSTEMS (negative unless checked):   Cardiac:  [] Chest pain or chest pressure? [] Shortness of breath upon activity? [] Shortness of breath when lying flat? [] Irregular heart rhythm?  Vascular:  [] Pain in calf, thigh, or hip brought on by walking? [] Pain in feet at night that wakes you up from your sleep? [] Blood clot in your veins? [] Leg swelling?  Pulmonary:  [] Oxygen at home? [] Productive cough? [] Wheezing?  Neurologic:  [] Sudden weakness in arms or legs? [] Sudden numbness in arms or legs? [] Sudden onset of difficult speaking or slurred speech? [] Temporary loss of vision in one eye? [] Problems with dizziness?  Gastrointestinal:  [] Blood in stool? [] Vomited blood?  Genitourinary:  [] Burning when urinating? [] Blood in urine? [x]  End stage renal disease-HD: T/R/S  Psychiatric:  [] Major depression  Hematologic:  [] Bleeding problems? [] Problems with blood clotting?  Dermatologic:  [] Rashes or ulcers?  Constitutional:  [] Fever or chills?  Ear/Nose/Throat:  [] Change in hearing? [] Nose bleeds? [] Sore throat?  Musculoskeletal:  [] Back pain? [] Joint pain? [] Muscle pain?   Physical Examination   Vitals:   08/04/17 0836  BP: (!) 108/53  Pulse: 71  Resp: 16  Temp: 99.2 F (37.3 C)  TempSrc: Oral  SpO2: 98%  Weight: 172 lb (78 kg)  Height: 5' 3" (1.6 m)   Body mass index is 30.47 kg/m.  General Alert, O x 3, WD, NAD  Pulmonary Sym exp, good B air movt, CTA B  Cardiac RRR, Nl S1, S2, no Murmurs, No rubs, No S3,S4  Vascular Vessel Right Left  Radial Palpable  Faintly palpable  Brachial Palpable Palpable  Ulnar Not palpable Not palpable    Musculo- skeletal M/S 5/5 throughout  , Extremities without ischemic changes  , palpable thrill in distal fistula, not palpable proximally, +bruit throughout  Neurologic Pain and light touch intact in extremities , Motor exam as listed above    Medical Decision Making   Kathleen [name] is a 71 y.o. female who presents with ESRD requiring hemodialysis, deep left   brachiocephalic arteriovenous fistula    I agree with Dr. Fields recommendation of L BC AVF superficialization.  I offered her this options  I had an extensive discussion with this patient in regards to the nature of access surgery, including risk, benefits, and alternatives.    The patient is aware that the risks of access surgery include but are not limited to: bleeding, infection, steal syndrome, nerve damage, ischemic monomelic neuropathy, failure of access to mature, and possible need for additional access procedures in the future.  The patient has agreed to proceed with the above procedure which will be scheduled 2 JAN 19.   Brian Chen, MD, FACS [facility] Office: [phone] Pager: [phone]  

## 2017-08-02 NOTE — H&P (View-Only) (Signed)
Established Dialysis Access   History of Present Illness   Kathleen [name] is a 71 y.o. (1945-10-06) female who presents for cc: difficulty cannulating L BC AVF.  Pt has had multiple infilitrations to date and continued HD via [place].  Dr. Oneida Alar recommended superficialization of the fistula in his notes on 07/14/17.  The patient denies any steal sx.  Past Medical History:  Diagnosis Date  . Anemia    a. 01/2014: suspected of chronic disease, negative FOBT.  [name] CAD (coronary artery disease)    a. Cath 01/2014: mild in LAD, mild-mod LCx, mod-severe RCA - for med rx.  . CKD (chronic kidney disease), stage III ([place])    a. Borderline III-IV.  . Diabetes mellitus without complication ([place])   . History of echocardiogram    Echo (10/15):  Mod LVH, EF 50-55%, Gr 1 DD, MAC, mild MR, mild TR, PASP 35 mmHg  . Hyperlipidemia   . Hypertension   . Meningioma ([place])    a. Incidental dx 01/2014.  . Obesity   . Systolic CHF ([place])    a. 09/7076: dx with mixed ICM/NICM (out of proportion to CAD) EF 25-30% by echo.     Past Surgical History:  Procedure Laterality Date  . ABDOMINAL HYSTERECTOMY    . AV FISTULA PLACEMENT Left 02/10/2017   Procedure: ARTERIOVENOUS (AV) FISTULA CREATION-LEFT FOREARM;  Surgeon: Elam Dutch, MD;  Location: [place];  Service: Vascular;  Laterality: Left;  . BREAST SURGERY    . INSERTION OF DIALYSIS CATHETER Right 02/10/2017   Procedure: INSERTION OF DIALYSIS CATHETER;  Surgeon: Elam Dutch, MD;  Location: [place];  Service: Vascular;  Laterality: Right;  . LEFT HEART CATHETERIZATION WITH CORONARY ANGIOGRAM N/A 01/27/2014   Procedure: LEFT HEART CATHETERIZATION WITH CORONARY ANGIOGRAM;  Surgeon: Jettie Booze, MD;  Location: [facility] CATH LAB;  Service: Cardiovascular;  Laterality: N/A;  . TONSILLECTOMY      Social History   Socioeconomic History  . Marital status: Divorced    Spouse name: Not on file  . Number of children: Not on file  . Years of education: Not on  file  . Highest education level: Not on file  Social Needs  . Financial resource strain: Not on file  . Food insecurity - worry: Not on file  . Food insecurity - inability: Not on file  . Transportation needs - medical: Not on file  . Transportation needs - non-medical: Not on file  Occupational History  . Not on file  Tobacco Use  . Smoking status: Former Smoker    Packs/day: 0.25    Years: 30.00    Pack years: 7.50    Types: Cigarettes  . Smokeless tobacco: Never Used  Substance and Sexual Activity  . Alcohol use: No  . Drug use: No  . Sexual activity: No  Other Topics Concern  . Not on file  Social History Narrative  . Not on file    Family History  Problem Relation Age of Onset  . Diabetic kidney disease Mother   . Hypertension Mother   . Heart failure Mother   . Heart attack Mother   . Hyperlipidemia Mother   . Hypertension Father     Current Outpatient Medications  Medication Sig Dispense Refill  . aspirin 81 MG EC tablet Take 1 tablet (81 mg total) by mouth daily. 30 tablet 3  . atorvastatin (LIPITOR) 40 MG tablet Take 1 tablet (40 mg total) by mouth daily at 6 PM. 30  tablet 0  . brimonidine-timolol (COMBIGAN) 0.2-0.5 % ophthalmic solution Place 1 drop into the right eye 2 (two) times daily as needed (for glaucoma).     . calcitRIOL (ROCALTROL) 0.5 MCG capsule Take 1 capsule (0.5 mcg total) by mouth Every Tuesday,Thursday,and Saturday with dialysis.    [name] calcium acetate (PHOSLO) 667 MG capsule Take 667 mg by mouth 3 (three) times daily with meals.    . carvedilol (COREG) 25 MG tablet Take 25 mg by mouth 2 (two) times daily.  3  . hydrALAZINE (APRESOLINE) 25 MG tablet Take 1 tablet (25 mg total) by mouth every 8 (eight) hours as needed (SBP > 170  or DBP > 110). 90 tablet 0  . insulin detemir (LEVEMIR) 100 UNIT/ML injection Inject 0.15 mLs (15 Units total) into the skin at bedtime. (Patient taking differently: Inject 30 Units into the skin at bedtime. ) 10 mL 11    . multivitamin (RENA-VIT) TABS tablet Take 1 tablet by mouth at bedtime. 30 tablet 0  . polyethylene glycol (MIRALAX / GLYCOLAX) packet Take 17 g by mouth daily. 14 each 0   No current facility-administered medications for this visit.      Allergies  Allergen Reactions  . Baclofen     Severe delirium when given to pateint in Dec 2018.     REVIEW OF SYSTEMS (negative unless checked):   Cardiac:  _0  Chest pain or chest pressure? _1  Shortness of breath upon activity? _2  Shortness of breath when lying flat? _3  Irregular heart rhythm?  Vascular:  _4  Pain in calf, thigh, or hip brought on by walking? _5  Pain in feet at night that wakes you up from your sleep? _6  Blood clot in your veins? _7  Leg swelling?  Pulmonary:  _8  Oxygen at home? _9  Productive cough? _10  Wheezing?  Neurologic:  _11  Sudden weakness in arms or legs? _12  Sudden numbness in arms or legs? _13  Sudden onset of difficult speaking or slurred speech? _14  Temporary loss of vision in one eye? _15  Problems with dizziness?  Gastrointestinal:  _16  Blood in stool? _17  Vomited blood?  Genitourinary:  _18  Burning when urinating? _19  Blood in urine? _20   End stage renal disease-HD: T/R/S  Psychiatric:  _21  Major depression  Hematologic:  _22  Bleeding problems? _23  Problems with blood clotting?  Dermatologic:  _24  Rashes or ulcers?  Constitutional:  _25  Fever or chills?  Ear/Nose/Throat:  _26  Change in hearing? _27  Nose bleeds? _28  Sore throat?  Musculoskeletal:  _29  Back pain? _30  Joint pain? _31  Muscle pain?   Physical Examination   Vitals:   08/04/17 0836  BP: (!) 108/53  Pulse: 71  Resp: 16  Temp: 99.2 F (37.3 C)  TempSrc: Oral  SpO2: 98%  Weight: 172 lb (78 kg)  Height: _32  (1.6 m)   Body mass index is 30.47 kg/m.  General Alert, O x 3, WD, NAD  Pulmonary Sym exp, good B air movt, CTA B  Cardiac RRR, Nl S1, S2, no Murmurs, No rubs, No S3,S4  Vascular Vessel Right Left  Radial Palpable  Faintly palpable  Brachial Palpable Palpable  Ulnar Not palpable Not palpable    Musculo- skeletal M/S 5/5 throughout  , Extremities without ischemic changes  , palpable thrill in distal fistula, not palpable proximally, +bruit throughout  Neurologic Pain and light touch intact in extremities , Motor exam as listed above    Medical Decision Making   Niema KAYIA BILLINGER is a 71 y.o. female who presents with ESRD requiring hemodialysis, deep left  brachiocephalic arteriovenous fistula    I agree with Dr. Oneida Alar recommendation of L BC AVF superficialization.  I offered her this options  I had an extensive discussion with this patient in regards to the nature of access surgery, including risk, benefits, and alternatives.    The patient is aware that the risks of access surgery include but are not limited to: bleeding, infection, steal syndrome, nerve damage, ischemic monomelic neuropathy, failure of access to mature, and possible need for additional access procedures in the future.  The patient has agreed to proceed with the above procedure which will be scheduled 2 JAN 19.   Adele Barthel, MD, FACS [facility] Office: [phone] Pager: [phone]

## 2017-08-03 DIAGNOSIS — D509 Iron deficiency anemia, unspecified: Secondary | ICD-10-CM | POA: Diagnosis not present

## 2017-08-03 DIAGNOSIS — E1129 Type 2 diabetes mellitus with other diabetic kidney complication: Secondary | ICD-10-CM | POA: Diagnosis not present

## 2017-08-03 DIAGNOSIS — D689 Coagulation defect, unspecified: Secondary | ICD-10-CM | POA: Diagnosis not present

## 2017-08-03 DIAGNOSIS — N2581 Secondary hyperparathyroidism of renal origin: Secondary | ICD-10-CM | POA: Diagnosis not present

## 2017-08-03 DIAGNOSIS — T8249XD Other complication of vascular dialysis catheter, subsequent encounter: Secondary | ICD-10-CM | POA: Diagnosis not present

## 2017-08-03 DIAGNOSIS — D631 Anemia in chronic kidney disease: Secondary | ICD-10-CM | POA: Diagnosis not present

## 2017-08-03 DIAGNOSIS — N186 End stage renal disease: Secondary | ICD-10-CM | POA: Diagnosis not present

## 2017-08-04 ENCOUNTER — Encounter: Payer: Self-pay | Admitting: Vascular Surgery

## 2017-08-04 ENCOUNTER — Other Ambulatory Visit: Payer: Self-pay

## 2017-08-04 ENCOUNTER — Encounter: Payer: Self-pay | Admitting: *Deleted

## 2017-08-04 ENCOUNTER — Other Ambulatory Visit: Payer: Self-pay | Admitting: *Deleted

## 2017-08-04 ENCOUNTER — Ambulatory Visit (INDEPENDENT_AMBULATORY_CARE_PROVIDER_SITE_OTHER): Payer: Medicare Other | Admitting: Vascular Surgery

## 2017-08-04 VITALS — BP 108/53 | HR 71 | Temp 99.2°F | Resp 16 | Ht 63.0 in | Wt 172.0 lb

## 2017-08-04 DIAGNOSIS — N186 End stage renal disease: Secondary | ICD-10-CM | POA: Diagnosis not present

## 2017-08-04 DIAGNOSIS — Z992 Dependence on renal dialysis: Secondary | ICD-10-CM

## 2017-08-05 DIAGNOSIS — D509 Iron deficiency anemia, unspecified: Secondary | ICD-10-CM | POA: Diagnosis not present

## 2017-08-05 DIAGNOSIS — D689 Coagulation defect, unspecified: Secondary | ICD-10-CM | POA: Diagnosis not present

## 2017-08-05 DIAGNOSIS — N186 End stage renal disease: Secondary | ICD-10-CM | POA: Diagnosis not present

## 2017-08-05 DIAGNOSIS — D631 Anemia in chronic kidney disease: Secondary | ICD-10-CM | POA: Diagnosis not present

## 2017-08-05 DIAGNOSIS — T8249XD Other complication of vascular dialysis catheter, subsequent encounter: Secondary | ICD-10-CM | POA: Diagnosis not present

## 2017-08-05 DIAGNOSIS — N2581 Secondary hyperparathyroidism of renal origin: Secondary | ICD-10-CM | POA: Diagnosis not present

## 2017-08-05 DIAGNOSIS — E1129 Type 2 diabetes mellitus with other diabetic kidney complication: Secondary | ICD-10-CM | POA: Diagnosis not present

## 2017-08-07 ENCOUNTER — Encounter (HOSPITAL_COMMUNITY): Payer: Self-pay | Admitting: *Deleted

## 2017-08-07 ENCOUNTER — Other Ambulatory Visit: Payer: Self-pay

## 2017-08-07 DIAGNOSIS — E1122 Type 2 diabetes mellitus with diabetic chronic kidney disease: Secondary | ICD-10-CM | POA: Diagnosis not present

## 2017-08-07 DIAGNOSIS — D509 Iron deficiency anemia, unspecified: Secondary | ICD-10-CM | POA: Diagnosis not present

## 2017-08-07 DIAGNOSIS — N186 End stage renal disease: Secondary | ICD-10-CM | POA: Diagnosis not present

## 2017-08-07 DIAGNOSIS — D689 Coagulation defect, unspecified: Secondary | ICD-10-CM | POA: Diagnosis not present

## 2017-08-07 DIAGNOSIS — E1129 Type 2 diabetes mellitus with other diabetic kidney complication: Secondary | ICD-10-CM | POA: Diagnosis not present

## 2017-08-07 DIAGNOSIS — Z992 Dependence on renal dialysis: Secondary | ICD-10-CM | POA: Diagnosis not present

## 2017-08-07 DIAGNOSIS — N2581 Secondary hyperparathyroidism of renal origin: Secondary | ICD-10-CM | POA: Diagnosis not present

## 2017-08-07 DIAGNOSIS — D631 Anemia in chronic kidney disease: Secondary | ICD-10-CM | POA: Diagnosis not present

## 2017-08-07 DIAGNOSIS — T8249XD Other complication of vascular dialysis catheter, subsequent encounter: Secondary | ICD-10-CM | POA: Diagnosis not present

## 2017-08-07 MED ORDER — LIDOCAINE HCL (PF) 1 % IJ SOLN: 5.0000 mL | INTRAMUSCULAR | Status: DC | PRN

## 2017-08-07 MED ORDER — HEPARIN SODIUM (PORCINE) 1000 UNIT/ML DIALYSIS: 1000.0000 [IU] | INTRAMUSCULAR | Status: DC | PRN

## 2017-08-07 MED ORDER — SODIUM CHLORIDE 0.9 % IV SOLN: 100.0000 mL | INTRAVENOUS | Status: DC | PRN

## 2017-08-07 MED ORDER — ALTEPLASE 2 MG IJ SOLR: 2.0000 mg | Freq: Once | INTRAMUSCULAR | Status: DC | PRN

## 2017-08-07 MED ORDER — SODIUM CHLORIDE 0.9 % IV SOLN
100.0000 mL | INTRAVENOUS | Status: DC | PRN
  Administered 2017-08-09 (×2): via INTRAVENOUS

## 2017-08-07 MED ORDER — HEPARIN SODIUM (PORCINE) 1000 UNIT/ML DIALYSIS: 20.0000 [IU]/kg | INTRAMUSCULAR | Status: DC | PRN

## 2017-08-07 MED ORDER — PENTAFLUOROPROP-TETRAFLUOROETH EX AERO: 1.0000 "application " | INHALATION_SPRAY | CUTANEOUS | Status: DC | PRN

## 2017-08-07 MED ORDER — LIDOCAINE-PRILOCAINE 2.5-2.5 % EX CREA: 1.0000 "application " | TOPICAL_CREAM | CUTANEOUS | Status: DC | PRN

## 2017-08-07 NOTE — Progress Notes (Signed)
Kathleen [name] does not check CBG on a regular basis, but has equipment .  I instructed patient to check CBG after awaking and every 2 hours until arrival  to the hospital.  I Instructed patient if CBG is less than 70 to drink 1/2 cup of a clear juice. Recheck CBG in 15 minutes then call pre- op desk at [phone] for further instructions. If scheduled to receive Insulin, do not take Insulin

## 2017-08-09 ENCOUNTER — Other Ambulatory Visit: Payer: Self-pay

## 2017-08-09 ENCOUNTER — Ambulatory Visit (HOSPITAL_COMMUNITY): Payer: Medicare Other | Admitting: Anesthesiology

## 2017-08-09 ENCOUNTER — Encounter (HOSPITAL_COMMUNITY): Payer: Self-pay | Admitting: *Deleted

## 2017-08-09 ENCOUNTER — Ambulatory Visit (HOSPITAL_COMMUNITY)
Admission: RE | Admit: 2017-08-09 | Discharge: 2017-08-09 | Disposition: A | Discharge status: Home / Self Care | Payer: Medicare Other | Source: Ambulatory Visit | Attending: Vascular Surgery | Admitting: Vascular Surgery

## 2017-08-09 ENCOUNTER — Encounter (HOSPITAL_COMMUNITY)
Admission: RE | Disposition: A | Discharge status: Home / Self Care | Payer: Self-pay | Source: Ambulatory Visit | Attending: Vascular Surgery

## 2017-08-09 DIAGNOSIS — Z833 Family history of diabetes mellitus: Secondary | ICD-10-CM | POA: Insufficient documentation

## 2017-08-09 DIAGNOSIS — I2510 Atherosclerotic heart disease of native coronary artery without angina pectoris: Secondary | ICD-10-CM | POA: Diagnosis not present

## 2017-08-09 DIAGNOSIS — Z7982 Long term (current) use of aspirin: Secondary | ICD-10-CM | POA: Diagnosis not present

## 2017-08-09 DIAGNOSIS — Z87891 Personal history of nicotine dependence: Secondary | ICD-10-CM | POA: Insufficient documentation

## 2017-08-09 DIAGNOSIS — E785 Hyperlipidemia, unspecified: Secondary | ICD-10-CM | POA: Insufficient documentation

## 2017-08-09 DIAGNOSIS — Z794 Long term (current) use of insulin: Secondary | ICD-10-CM | POA: Insufficient documentation

## 2017-08-09 DIAGNOSIS — I120 Hypertensive chronic kidney disease with stage 5 chronic kidney disease or end stage renal disease: Secondary | ICD-10-CM | POA: Diagnosis not present

## 2017-08-09 DIAGNOSIS — Z841 Family history of disorders of kidney and ureter: Secondary | ICD-10-CM | POA: Insufficient documentation

## 2017-08-09 DIAGNOSIS — Z79899 Other long term (current) drug therapy: Secondary | ICD-10-CM | POA: Insufficient documentation

## 2017-08-09 DIAGNOSIS — N186 End stage renal disease: Secondary | ICD-10-CM | POA: Insufficient documentation

## 2017-08-09 DIAGNOSIS — Z992 Dependence on renal dialysis: Secondary | ICD-10-CM | POA: Diagnosis not present

## 2017-08-09 DIAGNOSIS — I5020 Unspecified systolic (congestive) heart failure: Secondary | ICD-10-CM | POA: Insufficient documentation

## 2017-08-09 DIAGNOSIS — T82590A Other mechanical complication of surgically created arteriovenous fistula, initial encounter: Secondary | ICD-10-CM | POA: Diagnosis not present

## 2017-08-09 DIAGNOSIS — Z8249 Family history of ischemic heart disease and other diseases of the circulatory system: Secondary | ICD-10-CM | POA: Insufficient documentation

## 2017-08-09 DIAGNOSIS — E1122 Type 2 diabetes mellitus with diabetic chronic kidney disease: Secondary | ICD-10-CM | POA: Insufficient documentation

## 2017-08-09 DIAGNOSIS — T82898A Other specified complication of vascular prosthetic devices, implants and grafts, initial encounter: Secondary | ICD-10-CM | POA: Diagnosis not present

## 2017-08-09 DIAGNOSIS — Y832 Surgical operation with anastomosis, bypass or graft as the cause of abnormal reaction of the patient, or of later complication, without mention of misadventure at the time of the procedure: Secondary | ICD-10-CM | POA: Insufficient documentation

## 2017-08-09 DIAGNOSIS — I132 Hypertensive heart and chronic kidney disease with heart failure and with stage 5 chronic kidney disease, or end stage renal disease: Secondary | ICD-10-CM | POA: Insufficient documentation

## 2017-08-09 HISTORY — DX: Unspecified asthma, uncomplicated: J45.909

## 2017-08-09 HISTORY — PX: FISTULA SUPERFICIALIZATION: SHX6341

## 2017-08-09 HISTORY — DX: End stage renal disease: N18.6

## 2017-08-09 HISTORY — DX: Unspecified osteoarthritis, unspecified site: M19.90

## 2017-08-09 HISTORY — DX: Personal history of other medical treatment: Z92.89

## 2017-08-09 LAB — GLUCOSE, CAPILLARY
GLUCOSE-CAPILLARY: 95 mg/dL (ref 65–99)
Glucose-Capillary: 118 mg/dL — ABNORMAL HIGH (ref 65–99)

## 2017-08-09 LAB — POCT I-STAT 4, (NA,K, GLUC, HGB,HCT)
GLUCOSE: 124 mg/dL — AB (ref 65–99)
HCT: 35 % — ABNORMAL LOW (ref 36.0–46.0)
Hemoglobin: 11.9 g/dL — ABNORMAL LOW (ref 12.0–15.0)
POTASSIUM: 3.8 mmol/L (ref 3.5–5.1)
SODIUM: 135 mmol/L (ref 135–145)

## 2017-08-09 SURGERY — FISTULA SUPERFICIALIZATION
Anesthesia: General | Site: Arm Upper | Laterality: Left

## 2017-08-09 MED ORDER — SODIUM CHLORIDE 0.9 % IV SOLN
INTRAVENOUS | Status: DC
  Administered 2017-08-09: 10:00:00 via INTRAVENOUS

## 2017-08-09 MED ORDER — ONDANSETRON HCL 4 MG/2ML IJ SOLN
INTRAMUSCULAR | Status: DC | PRN
  Administered 2017-08-09: 4 mg via INTRAVENOUS

## 2017-08-09 MED ORDER — SODIUM CHLORIDE 0.9 % IV SOLN
INTRAVENOUS | Status: DC | PRN
  Administered 2017-08-09: 500 mL

## 2017-08-09 MED ORDER — ONDANSETRON HCL 4 MG/2ML IJ SOLN
INTRAMUSCULAR | Status: AC
  Filled 2017-08-09: qty 2

## 2017-08-09 MED ORDER — PROPOFOL 10 MG/ML IV BOLUS
INTRAVENOUS | Status: DC | PRN
  Administered 2017-08-09: 100 mg via INTRAVENOUS

## 2017-08-09 MED ORDER — CARVEDILOL 12.5 MG PO TABS
ORAL_TABLET | ORAL | Status: AC
  Filled 2017-08-09: qty 2

## 2017-08-09 MED ORDER — MIDAZOLAM HCL 5 MG/5ML IJ SOLN
INTRAMUSCULAR | Status: DC | PRN
  Administered 2017-08-09: 2 mg via INTRAVENOUS

## 2017-08-09 MED ORDER — FENTANYL CITRATE (PF) 100 MCG/2ML IJ SOLN
INTRAMUSCULAR | Status: DC | PRN
  Administered 2017-08-09 (×2): 25 ug via INTRAVENOUS

## 2017-08-09 MED ORDER — DEXAMETHASONE SODIUM PHOSPHATE 10 MG/ML IJ SOLN
INTRAMUSCULAR | Status: AC
  Filled 2017-08-09: qty 1

## 2017-08-09 MED ORDER — DEXTROSE 5 % IV SOLN
1.5000 g | INTRAVENOUS | Status: AC
  Administered 2017-08-09: 1.5 g via INTRAVENOUS

## 2017-08-09 MED ORDER — CARVEDILOL 25 MG PO TABS
25.0000 mg | ORAL_TABLET | Freq: Once | ORAL | Status: AC
  Administered 2017-08-09: 25 mg via ORAL

## 2017-08-09 MED ORDER — LIDOCAINE HCL (CARDIAC) 20 MG/ML IV SOLN
INTRAVENOUS | Status: DC | PRN
  Administered 2017-08-09: 100 mg via INTRAVENOUS

## 2017-08-09 MED ORDER — LIDOCAINE 2% (20 MG/ML) 5 ML SYRINGE
INTRAMUSCULAR | Status: AC
  Filled 2017-08-09: qty 5

## 2017-08-09 MED ORDER — PROPOFOL 10 MG/ML IV BOLUS
INTRAVENOUS | Status: AC
  Filled 2017-08-09: qty 20

## 2017-08-09 MED ORDER — ONDANSETRON HCL 4 MG/2ML IJ SOLN: 4.0000 mg | Freq: Once | INTRAMUSCULAR | Status: DC | PRN

## 2017-08-09 MED ORDER — MEPERIDINE HCL 25 MG/ML IJ SOLN: 6.2500 mg | INTRAMUSCULAR | Status: DC | PRN

## 2017-08-09 MED ORDER — HYDROMORPHONE HCL 1 MG/ML IJ SOLN: 0.2500 mg | INTRAMUSCULAR | Status: DC | PRN

## 2017-08-09 MED ORDER — FENTANYL CITRATE (PF) 250 MCG/5ML IJ SOLN
INTRAMUSCULAR | Status: AC
  Filled 2017-08-09: qty 5

## 2017-08-09 MED ORDER — 0.9 % SODIUM CHLORIDE (POUR BTL) OPTIME
TOPICAL | Status: DC | PRN
  Administered 2017-08-09: 1000 mL

## 2017-08-09 MED ORDER — MIDAZOLAM HCL 2 MG/2ML IJ SOLN
INTRAMUSCULAR | Status: AC
  Filled 2017-08-09: qty 2

## 2017-08-09 MED ORDER — DEXTROSE 5 % IV SOLN
INTRAVENOUS | Status: AC
  Filled 2017-08-09: qty 1.5

## 2017-08-09 MED ORDER — LIDOCAINE HCL 1 % IJ SOLN
INTRAMUSCULAR | Status: AC
  Filled 2017-08-09: qty 20

## 2017-08-09 MED ORDER — EPHEDRINE SULFATE 50 MG/ML IJ SOLN
INTRAMUSCULAR | Status: DC | PRN
  Administered 2017-08-09 (×2): 5 mg via INTRAVENOUS
  Administered 2017-08-09 (×2): 10 mg via INTRAVENOUS

## 2017-08-09 MED ORDER — DEXAMETHASONE SODIUM PHOSPHATE 10 MG/ML IJ SOLN
INTRAMUSCULAR | Status: DC | PRN
  Administered 2017-08-09: 10 mg via INTRAVENOUS

## 2017-08-09 MED ORDER — PHENYLEPHRINE HCL 10 MG/ML IJ SOLN
INTRAVENOUS | Status: DC | PRN
  Administered 2017-08-09: 50 ug/min via INTRAVENOUS

## 2017-08-09 MED ORDER — PHENYLEPHRINE HCL 10 MG/ML IJ SOLN
INTRAMUSCULAR | Status: DC | PRN
  Administered 2017-08-09: 120 ug via INTRAVENOUS
  Administered 2017-08-09: 80 ug via INTRAVENOUS
  Administered 2017-08-09: 120 ug via INTRAVENOUS

## 2017-08-09 MED ORDER — HEMOSTATIC AGENTS (NO CHARGE) OPTIME
TOPICAL | Status: DC | PRN
  Administered 2017-08-09: 1 via TOPICAL

## 2017-08-09 MED ORDER — HYDROCODONE-ACETAMINOPHEN 5-325 MG PO TABS: 1.0000 | ORAL_TABLET | Freq: Four times a day (QID) | ORAL | 0 refills | Status: DC | PRN

## 2017-08-09 SURGICAL SUPPLY — 42 items
ADH SKN CLS APL DERMABOND .7 (GAUZE/BANDAGES/DRESSINGS) ×1
ADH SKN CLS LQ APL DERMABOND (GAUZE/BANDAGES/DRESSINGS) ×1
AGENT HMST SPONGE THK3/8 (HEMOSTASIS) ×1
ARMBAND PINK RESTRICT EXTREMIT (MISCELLANEOUS) ×3 IMPLANT
CANISTER SUCT 3000ML PPV (MISCELLANEOUS) ×3 IMPLANT
CLIP VESOCCLUDE MED 6/CT (CLIP) ×3 IMPLANT
CLIP VESOCCLUDE SM WIDE 6/CT (CLIP) ×3 IMPLANT
COVER PROBE W GEL 5X96 (DRAPES) IMPLANT
DECANTER SPIKE VIAL GLASS SM (MISCELLANEOUS) ×3 IMPLANT
DERMABOND ADHESIVE PROPEN (GAUZE/BANDAGES/DRESSINGS) ×2
DERMABOND ADVANCED (GAUZE/BANDAGES/DRESSINGS) ×2
DERMABOND ADVANCED .7 DNX12 (GAUZE/BANDAGES/DRESSINGS) ×1 IMPLANT
DERMABOND ADVANCED .7 DNX6 (GAUZE/BANDAGES/DRESSINGS) IMPLANT
DRAIN PENROSE 1/2X12 LTX STRL (WOUND CARE) IMPLANT
ELECT REM PT RETURN 9FT ADLT (ELECTROSURGICAL) ×3
ELECTRODE REM PT RTRN 9FT ADLT (ELECTROSURGICAL) ×1 IMPLANT
GAUZE SPONGE 4X4 16PLY XRAY LF (GAUZE/BANDAGES/DRESSINGS) ×2 IMPLANT
GLOVE BIO SURGEON STRL SZ 6.5 (GLOVE) ×4 IMPLANT
GLOVE BIO SURGEON STRL SZ7 (GLOVE) ×3 IMPLANT
GLOVE BIO SURGEONS STRL SZ 6.5 (GLOVE) ×4
GLOVE BIOGEL PI IND STRL 6.5 (GLOVE) IMPLANT
GLOVE BIOGEL PI IND STRL 7.5 (GLOVE) ×1 IMPLANT
GLOVE BIOGEL PI INDICATOR 6.5 (GLOVE) ×8
GLOVE BIOGEL PI INDICATOR 7.5 (GLOVE) ×2
GOWN STRL REUS W/ TWL LRG LVL3 (GOWN DISPOSABLE) ×3 IMPLANT
GOWN STRL REUS W/TWL LRG LVL3 (GOWN DISPOSABLE) ×9
HEMOSTAT SPONGE AVITENE ULTRA (HEMOSTASIS) ×2 IMPLANT
KIT BASIN OR (CUSTOM PROCEDURE TRAY) ×3 IMPLANT
KIT ROOM TURNOVER OR (KITS) ×3 IMPLANT
NS IRRIG 1000ML POUR BTL (IV SOLUTION) ×3 IMPLANT
PACK CV ACCESS (CUSTOM PROCEDURE TRAY) ×3 IMPLANT
PAD ARMBOARD 7.5X6 YLW CONV (MISCELLANEOUS) ×6 IMPLANT
SUT MNCRL AB 4-0 PS2 18 (SUTURE) ×3 IMPLANT
SUT PROLENE 6 0 BV (SUTURE) IMPLANT
SUT PROLENE 7 0 BV 1 (SUTURE) ×3 IMPLANT
SUT SILK 3 0 TIES 17X18 (SUTURE) ×3
SUT SILK 3-0 18XBRD TIE BLK (SUTURE) IMPLANT
SUT VIC AB 3-0 SH 27 (SUTURE) ×12
SUT VIC AB 3-0 SH 27X BRD (SUTURE) ×1 IMPLANT
TOWEL GREEN STERILE (TOWEL DISPOSABLE) ×3 IMPLANT
UNDERPAD 30X30 (UNDERPADS AND DIAPERS) ×3 IMPLANT
WATER STERILE IRR 1000ML POUR (IV SOLUTION) ×3 IMPLANT

## 2017-08-09 NOTE — Interval H&P Note (Signed)
History and Physical Interval Note:  08/09/2017 10:23 AM  Kathleen [name]  has presented today for surgery, with the diagnosis of COMPLICATION WITH FISTULA  The various methods of treatment have been discussed with the patient and family. After consideration of risks, benefits and other options for treatment, the patient has consented to  Procedure(s): FISTULA SUPERFICIALIZATION LEFT ARM ARTERIOVENOUS FISTULA (Left) as a surgical intervention .  The patient's history has been reviewed, patient examined, no change in status, stable for surgery.  I have reviewed the patient's chart and labs.  Questions were answered to the patient's satisfaction.     Adele Barthel

## 2017-08-09 NOTE — Transfer of Care (Signed)
Immediate Anesthesia Transfer of Care Note  Patient: Kathleen [name]  Procedure(s) Performed: FISTULA SUPERFICIALIZATION LEFT ARM ARTERIOVENOUS FISTULA (Left Arm Upper)  Patient Location: PACU  Anesthesia Type:General  Level of Consciousness: oriented, drowsy and patient cooperative  Airway & Oxygen Therapy: Patient Spontanous Breathing and Patient connected to nasal cannula oxygen  Post-op Assessment: Report given to RN and Post -op Vital signs reviewed and stable  Post vital signs: Reviewed  Last Vitals:  Vitals:   08/09/17 1024 08/09/17 1101  BP: (!) 198/66 (!) 185/63  Pulse: 66   Resp:    Temp:    SpO2:      Last Pain:  Vitals:   08/09/17 1024  TempSrc:   PainSc: 3       Patients Stated Pain Goal: 5 (05/39/76 7341)  Complications: No apparent anesthesia complications

## 2017-08-09 NOTE — Discharge Instructions (Signed)
° °  [facility]  Discharge Instructions  AV Fistula or Graft Surgery for Dialysis Access  Please refer to the following instructions for your post-procedure care. Your surgeon or physician assistant will discuss any changes with you.  Activity  You may drive the day following your surgery, if you are comfortable and no longer taking prescription pain medication. Resume full activity as the soreness in your incision resolves.  Bathing/Showering  You may shower after you go home. Keep your incision dry for 48 hours. Do not soak in a bathtub, hot tub, or swim until the incision heals completely. You may not shower if you have a hemodialysis catheter.  Incision Care  Clean your incision with mild soap and water after 48 hours. Pat the area dry with a clean towel. You do not need a bandage unless otherwise instructed. Do not apply any ointments or creams to your incision. You may have skin glue on your incision. Do not peel it off. It will come off on its own in about one week. Your arm may swell a bit after surgery. To reduce swelling use pillows to elevate your arm so it is above your heart. Your doctor will tell you if you need to lightly wrap your arm with an ACE bandage.  Diet  Resume your normal diet. There are not special food restrictions following this procedure. In order to heal from your surgery, it is CRITICAL to get adequate nutrition. Your body requires vitamins, minerals, and protein. Vegetables are the best source of vitamins and minerals. Vegetables also provide the perfect balance of protein. Processed food has little nutritional value, so try to avoid this.  Medications  Resume taking all of your medications. If your incision is causing pain, you may take over-the counter pain relievers such as acetaminophen (Tylenol). If you were prescribed a stronger pain medication, please be aware these medications can cause nausea and constipation. Prevent  nausea by taking the medication with a snack or meal. Avoid constipation by drinking plenty of fluids and eating foods with high amount of fiber, such as fruits, vegetables, and grains.  Do not take Tylenol if you are taking prescription pain medications.  Follow up Your surgeon may want to see you in the office following your access surgery. If so, this will be arranged at the time of your surgery.  Please call us immediately for any of the following conditions:  Increased pain, redness, drainage (pus) from your incision site Fever of 101 degrees or higher Severe or worsening pain at your incision site Hand pain or numbness.  Reduce your risk of vascular disease:  Stop smoking. If you would like help, call [facility] at 1-800-QUIT-NOW 309-148-8942) or [place] at [place] your cholesterol Maintain a desired weight Control your diabetes Keep your blood pressure down  Dialysis  It will take several weeks to several months for your new dialysis access to be ready for use. Your surgeon will determine when it is OK to use it. Your nephrologist will continue to direct your dialysis. You can continue to use your Permcath until your new access is ready for use.   08/09/2017 Kathleen [name] [id number] October 07, 1945  Surgeon(s): Conrad [place], MD  Procedure(s): FISTULA SUPERFICIALIZATION LEFT ARM ARTERIOVENOUS FISTULA  x Do not stick fistula for 4 weeks    If you have any questions, please call the office at [phone].

## 2017-08-09 NOTE — Anesthesia Postprocedure Evaluation (Signed)
Anesthesia Post Note  Patient: Kathleen [name]  Procedure(s) Performed: FISTULA SUPERFICIALIZATION LEFT ARM ARTERIOVENOUS FISTULA (Left Arm Upper)     Patient location during evaluation: PACU Anesthesia Type: General Level of consciousness: awake and alert Pain management: pain level controlled Vital Signs Assessment: post-procedure vital signs reviewed and stable Respiratory status: spontaneous breathing, nonlabored ventilation, respiratory function stable and patient connected to nasal cannula oxygen Cardiovascular status: blood pressure returned to baseline and stable Postop Assessment: no apparent nausea or vomiting Anesthetic complications: no    Last Vitals:  Vitals:   08/09/17 1341 08/09/17 1345  BP: (!) 124/57   Pulse:  (!) 57  Resp: 15 14  Temp:    SpO2:  100%    Last Pain:  Vitals:   08/09/17 1327  TempSrc:   PainSc: [place] DAVID

## 2017-08-09 NOTE — Anesthesia Procedure Notes (Signed)
Procedure Name: LMA Insertion Date/Time: 08/09/2017 11:55 AM Performed by: Jenne Campus, CRNA Pre-anesthesia Checklist: Patient identified, Emergency Drugs available, Suction available and Patient being monitored Patient Re-evaluated:Patient Re-evaluated prior to induction Oxygen Delivery Method: Circle System Utilized Preoxygenation: Pre-oxygenation with 100% oxygen Induction Type: IV induction Ventilation: Mask ventilation without difficulty LMA: LMA inserted LMA Size: 4.0 Number of attempts: 1 Airway Equipment and Method: Bite block Placement Confirmation: positive ETCO2 and breath sounds checked- equal and bilateral Tube secured with: Tape Dental Injury: Teeth and Oropharynx as per pre-operative assessment

## 2017-08-09 NOTE — Op Note (Signed)
    OPERATIVE NOTE   PROCEDURE: 1. Superficialization of left brachiocephalic arteriovenous fistula  2. Side branch ligation x 5  PRE-OPERATIVE DIAGNOSIS: deep left brachiocephalic arteriovenous fistula resulting in infilitrations  POST-OPERATIVE DIAGNOSIS: same as above   SURGEON: Adele Barthel, MD  ASSISTANT(S): Leontine Locket, PAC   ANESTHESIA: general  ESTIMATED BLOOD LOSS: 100 cc  FINDING(S): 1. Serpiginious fistula that >1-2 cm deep 2. Easily palpable thrill in transposed fistula 3. Obvious hematoma in subcutaneous tissue  SPECIMEN(S):  none  INDICATIONS:   Kathleen [name] is a 72 y.o. female who  presents with deep left brachiocephalic arteriovenous fistula arteriovenous fistula.  We discussed that superficialization of the fistula was necessary to facilitate future cannulation of the fistula.  In the process, ligation of side branches would be completed.  Risk, benefits, and alternatives to access surgery were discussed.  The patient is aware the risks include but are not limited to: bleeding, infection, steal syndrome, nerve damage, ischemic monomelic neuropathy, failure to mature, need for additional procedures, death and stroke.  The patient agrees to proceed forward with the procedure.  DESCRIPTION: After obtain full informed written consent, the patient was brought back to the operating room and placed supine upon the operating table.  The patient received IV antibiotics prior to induction.  After obtaining adequate anesthesia, the patient was prepped and draped in the standard fashion for: left access procedure.  The brachiocephalic fistula was identified with the Sonosite and marked on the arm.  I made a longitudinal incision over the fistula and dissected down to the fistula.  I mobilized the fistula from 4 cm proximal to the anastomosis to the 4 cm distal to the axilla, in the process ligating 5 large side branches with silk ties.  The fistula appeared: serpiginious an  >6 mm through.    I sharply lysed connective tissue surrounding the fistula.  This helped eliminate some the tortuous curves in the fistula but not all of them.  I made a shelf in the adjacent subcutaneous tissue that was ~6 mm deep from the skin.  I transposed the fistula onto this shelf and secured the fistula in placed with interrupted 3-0 Vicryl stitches, taking care to avoid constricting the fistula.  The deep subcutaneous tissue was reapproximated with interrupted 3-0 Vicryl to abolish some of the dead space.  A running subcutaneous stitch of 3-0 Vicryl was used to reapproximated the superficial subcutaneous tissue.  The skin was then reapproximated with a running subcuticular 4-0 Monocryl.  The skin was cleaned, dried, and the skin closure reinforced with Dermabond.  There was an easily palpable thrill at the end of this case.   COMPLICATIONS: none  CONDITION: stable   Adele Barthel, MD, [facility] [place] Office: [phone] Pager: [phone]  08/09/2017, 1:10 PM

## 2017-08-09 NOTE — Anesthesia Preprocedure Evaluation (Addendum)
Anesthesia Evaluation  Patient identified by MRN, date of birth, ID band Patient awake    Reviewed: Allergy & Precautions, NPO status , Patient's Chart, lab work & pertinent test results  Airway Mallampati: II  TM Distance: >3 FB Neck ROM: Full    Dental  (+) Teeth Intact, Dental Advisory Given   Pulmonary former smoker,    Pulmonary exam normal        Cardiovascular hypertension, Pt. on medications and Pt. on home beta blockers + CAD  Normal cardiovascular exam     Neuro/Psych    GI/Hepatic   Endo/Other  diabetes, Type 2, Insulin Dependent  Renal/GU ESRF and DialysisRenal disease     Musculoskeletal   Abdominal   Peds  Hematology   Anesthesia Other Findings   Reproductive/Obstetrics                           Anesthesia Physical Anesthesia Plan  ASA: III  Anesthesia Plan: General   Post-op Pain Management:    Induction: Intravenous  PONV Risk Score and Plan: 2 and Ondansetron and Treatment may vary due to age or medical condition  Airway Management Planned: LMA  Additional Equipment:   Intra-op Plan:   Post-operative Plan: Extubation in OR  Informed Consent: I have reviewed the patients History and Physical, chart, labs and discussed the procedure including the risks, benefits and alternatives for the proposed anesthesia with the patient or authorized representative who has indicated his/her understanding and acceptance.     Plan Discussed with: CRNA and Surgeon  Anesthesia Plan Comments:         Anesthesia Quick Evaluation

## 2017-08-10 ENCOUNTER — Telehealth: Payer: Self-pay | Admitting: Vascular Surgery

## 2017-08-10 ENCOUNTER — Encounter (HOSPITAL_COMMUNITY): Payer: Self-pay | Admitting: Vascular Surgery

## 2017-08-10 DIAGNOSIS — D509 Iron deficiency anemia, unspecified: Secondary | ICD-10-CM | POA: Diagnosis not present

## 2017-08-10 DIAGNOSIS — D689 Coagulation defect, unspecified: Secondary | ICD-10-CM | POA: Diagnosis not present

## 2017-08-10 DIAGNOSIS — N186 End stage renal disease: Secondary | ICD-10-CM | POA: Diagnosis not present

## 2017-08-10 DIAGNOSIS — T8249XD Other complication of vascular dialysis catheter, subsequent encounter: Secondary | ICD-10-CM | POA: Diagnosis not present

## 2017-08-10 DIAGNOSIS — E1129 Type 2 diabetes mellitus with other diabetic kidney complication: Secondary | ICD-10-CM | POA: Diagnosis not present

## 2017-08-10 DIAGNOSIS — N2581 Secondary hyperparathyroidism of renal origin: Secondary | ICD-10-CM | POA: Diagnosis not present

## 2017-08-10 DIAGNOSIS — D631 Anemia in chronic kidney disease: Secondary | ICD-10-CM | POA: Diagnosis not present

## 2017-08-10 NOTE — Telephone Encounter (Signed)
-----   Message from Mena Goes, RN sent at 08/09/2017  2:56 PM EST ----- Regarding: 4 weeks   ----- Message ----- From: Gabriel Earing, PA-C Sent: 08/09/2017   1:14 PM To: Vvs Charge Pool  S/p superficialization of LUA AVF.  F/u with Dr. Bridgett Larsson in 4 weeks.  Thanks

## 2017-08-10 NOTE — Telephone Encounter (Signed)
Sched appt 09/08/17 at 3:00. No vm, mailed appt letter.

## 2017-08-11 DIAGNOSIS — I770 Arteriovenous fistula, acquired: Secondary | ICD-10-CM | POA: Diagnosis not present

## 2017-08-11 DIAGNOSIS — I130 Hypertensive heart and chronic kidney disease with heart failure and stage 1 through stage 4 chronic kidney disease, or unspecified chronic kidney disease: Secondary | ICD-10-CM | POA: Diagnosis not present

## 2017-08-11 DIAGNOSIS — E785 Hyperlipidemia, unspecified: Secondary | ICD-10-CM | POA: Diagnosis not present

## 2017-08-11 DIAGNOSIS — I771 Stricture of artery: Secondary | ICD-10-CM | POA: Diagnosis not present

## 2017-08-11 DIAGNOSIS — Z7982 Long term (current) use of aspirin: Secondary | ICD-10-CM | POA: Diagnosis not present

## 2017-08-11 DIAGNOSIS — Z992 Dependence on renal dialysis: Secondary | ICD-10-CM | POA: Diagnosis not present

## 2017-08-11 DIAGNOSIS — E1122 Type 2 diabetes mellitus with diabetic chronic kidney disease: Secondary | ICD-10-CM | POA: Diagnosis not present

## 2017-08-11 DIAGNOSIS — D649 Anemia, unspecified: Secondary | ICD-10-CM | POA: Diagnosis not present

## 2017-08-11 DIAGNOSIS — I2510 Atherosclerotic heart disease of native coronary artery without angina pectoris: Secondary | ICD-10-CM | POA: Diagnosis not present

## 2017-08-11 DIAGNOSIS — I6509 Occlusion and stenosis of unspecified vertebral artery: Secondary | ICD-10-CM | POA: Diagnosis not present

## 2017-08-11 DIAGNOSIS — I5042 Chronic combined systolic (congestive) and diastolic (congestive) heart failure: Secondary | ICD-10-CM | POA: Diagnosis not present

## 2017-08-11 DIAGNOSIS — N186 End stage renal disease: Secondary | ICD-10-CM | POA: Diagnosis not present

## 2017-08-11 DIAGNOSIS — Z794 Long term (current) use of insulin: Secondary | ICD-10-CM | POA: Diagnosis not present

## 2017-08-11 DIAGNOSIS — Z87891 Personal history of nicotine dependence: Secondary | ICD-10-CM | POA: Diagnosis not present

## 2017-08-12 DIAGNOSIS — D631 Anemia in chronic kidney disease: Secondary | ICD-10-CM | POA: Diagnosis not present

## 2017-08-12 DIAGNOSIS — T8249XD Other complication of vascular dialysis catheter, subsequent encounter: Secondary | ICD-10-CM | POA: Diagnosis not present

## 2017-08-12 DIAGNOSIS — D689 Coagulation defect, unspecified: Secondary | ICD-10-CM | POA: Diagnosis not present

## 2017-08-12 DIAGNOSIS — E1129 Type 2 diabetes mellitus with other diabetic kidney complication: Secondary | ICD-10-CM | POA: Diagnosis not present

## 2017-08-12 DIAGNOSIS — N2581 Secondary hyperparathyroidism of renal origin: Secondary | ICD-10-CM | POA: Diagnosis not present

## 2017-08-12 DIAGNOSIS — D509 Iron deficiency anemia, unspecified: Secondary | ICD-10-CM | POA: Diagnosis not present

## 2017-08-12 DIAGNOSIS — N186 End stage renal disease: Secondary | ICD-10-CM | POA: Diagnosis not present

## 2017-08-14 DIAGNOSIS — E1122 Type 2 diabetes mellitus with diabetic chronic kidney disease: Secondary | ICD-10-CM | POA: Diagnosis not present

## 2017-08-14 DIAGNOSIS — D649 Anemia, unspecified: Secondary | ICD-10-CM | POA: Diagnosis not present

## 2017-08-14 DIAGNOSIS — N186 End stage renal disease: Secondary | ICD-10-CM | POA: Diagnosis not present

## 2017-08-14 DIAGNOSIS — Z794 Long term (current) use of insulin: Secondary | ICD-10-CM | POA: Diagnosis not present

## 2017-08-14 DIAGNOSIS — I130 Hypertensive heart and chronic kidney disease with heart failure and stage 1 through stage 4 chronic kidney disease, or unspecified chronic kidney disease: Secondary | ICD-10-CM | POA: Diagnosis not present

## 2017-08-14 DIAGNOSIS — Z992 Dependence on renal dialysis: Secondary | ICD-10-CM | POA: Diagnosis not present

## 2017-08-14 DIAGNOSIS — I2510 Atherosclerotic heart disease of native coronary artery without angina pectoris: Secondary | ICD-10-CM | POA: Diagnosis not present

## 2017-08-14 DIAGNOSIS — Z87891 Personal history of nicotine dependence: Secondary | ICD-10-CM | POA: Diagnosis not present

## 2017-08-14 DIAGNOSIS — Z7982 Long term (current) use of aspirin: Secondary | ICD-10-CM | POA: Diagnosis not present

## 2017-08-14 DIAGNOSIS — I6509 Occlusion and stenosis of unspecified vertebral artery: Secondary | ICD-10-CM | POA: Diagnosis not present

## 2017-08-14 DIAGNOSIS — I770 Arteriovenous fistula, acquired: Secondary | ICD-10-CM | POA: Diagnosis not present

## 2017-08-14 DIAGNOSIS — E785 Hyperlipidemia, unspecified: Secondary | ICD-10-CM | POA: Diagnosis not present

## 2017-08-14 DIAGNOSIS — I771 Stricture of artery: Secondary | ICD-10-CM | POA: Diagnosis not present

## 2017-08-14 DIAGNOSIS — I5042 Chronic combined systolic (congestive) and diastolic (congestive) heart failure: Secondary | ICD-10-CM | POA: Diagnosis not present

## 2017-08-15 DIAGNOSIS — Z0000 Encounter for general adult medical examination without abnormal findings: Secondary | ICD-10-CM | POA: Diagnosis not present

## 2017-08-15 DIAGNOSIS — T8249XD Other complication of vascular dialysis catheter, subsequent encounter: Secondary | ICD-10-CM | POA: Diagnosis not present

## 2017-08-15 DIAGNOSIS — E1129 Type 2 diabetes mellitus with other diabetic kidney complication: Secondary | ICD-10-CM | POA: Diagnosis not present

## 2017-08-15 DIAGNOSIS — N186 End stage renal disease: Secondary | ICD-10-CM | POA: Diagnosis not present

## 2017-08-15 DIAGNOSIS — D509 Iron deficiency anemia, unspecified: Secondary | ICD-10-CM | POA: Diagnosis not present

## 2017-08-15 DIAGNOSIS — E785 Hyperlipidemia, unspecified: Secondary | ICD-10-CM | POA: Diagnosis not present

## 2017-08-15 DIAGNOSIS — D631 Anemia in chronic kidney disease: Secondary | ICD-10-CM | POA: Diagnosis not present

## 2017-08-15 DIAGNOSIS — N2581 Secondary hyperparathyroidism of renal origin: Secondary | ICD-10-CM | POA: Diagnosis not present

## 2017-08-15 DIAGNOSIS — E119 Type 2 diabetes mellitus without complications: Secondary | ICD-10-CM | POA: Diagnosis not present

## 2017-08-15 DIAGNOSIS — I10 Essential (primary) hypertension: Secondary | ICD-10-CM | POA: Diagnosis not present

## 2017-08-15 DIAGNOSIS — D689 Coagulation defect, unspecified: Secondary | ICD-10-CM | POA: Diagnosis not present

## 2017-08-15 DIAGNOSIS — R51 Headache: Secondary | ICD-10-CM | POA: Diagnosis not present

## 2017-08-16 DIAGNOSIS — I770 Arteriovenous fistula, acquired: Secondary | ICD-10-CM | POA: Diagnosis not present

## 2017-08-16 DIAGNOSIS — I6509 Occlusion and stenosis of unspecified vertebral artery: Secondary | ICD-10-CM | POA: Diagnosis not present

## 2017-08-16 DIAGNOSIS — Z7982 Long term (current) use of aspirin: Secondary | ICD-10-CM | POA: Diagnosis not present

## 2017-08-16 DIAGNOSIS — E1122 Type 2 diabetes mellitus with diabetic chronic kidney disease: Secondary | ICD-10-CM | POA: Diagnosis not present

## 2017-08-16 DIAGNOSIS — N186 End stage renal disease: Secondary | ICD-10-CM | POA: Diagnosis not present

## 2017-08-16 DIAGNOSIS — Z87891 Personal history of nicotine dependence: Secondary | ICD-10-CM | POA: Diagnosis not present

## 2017-08-16 DIAGNOSIS — Z992 Dependence on renal dialysis: Secondary | ICD-10-CM | POA: Diagnosis not present

## 2017-08-16 DIAGNOSIS — I5042 Chronic combined systolic (congestive) and diastolic (congestive) heart failure: Secondary | ICD-10-CM | POA: Diagnosis not present

## 2017-08-16 DIAGNOSIS — D649 Anemia, unspecified: Secondary | ICD-10-CM | POA: Diagnosis not present

## 2017-08-16 DIAGNOSIS — I130 Hypertensive heart and chronic kidney disease with heart failure and stage 1 through stage 4 chronic kidney disease, or unspecified chronic kidney disease: Secondary | ICD-10-CM | POA: Diagnosis not present

## 2017-08-16 DIAGNOSIS — I2510 Atherosclerotic heart disease of native coronary artery without angina pectoris: Secondary | ICD-10-CM | POA: Diagnosis not present

## 2017-08-16 DIAGNOSIS — I771 Stricture of artery: Secondary | ICD-10-CM | POA: Diagnosis not present

## 2017-08-16 DIAGNOSIS — Z794 Long term (current) use of insulin: Secondary | ICD-10-CM | POA: Diagnosis not present

## 2017-08-16 DIAGNOSIS — E785 Hyperlipidemia, unspecified: Secondary | ICD-10-CM | POA: Diagnosis not present

## 2017-08-17 DIAGNOSIS — D509 Iron deficiency anemia, unspecified: Secondary | ICD-10-CM | POA: Diagnosis not present

## 2017-08-17 DIAGNOSIS — D631 Anemia in chronic kidney disease: Secondary | ICD-10-CM | POA: Diagnosis not present

## 2017-08-17 DIAGNOSIS — T8249XD Other complication of vascular dialysis catheter, subsequent encounter: Secondary | ICD-10-CM | POA: Diagnosis not present

## 2017-08-17 DIAGNOSIS — N186 End stage renal disease: Secondary | ICD-10-CM | POA: Diagnosis not present

## 2017-08-17 DIAGNOSIS — D689 Coagulation defect, unspecified: Secondary | ICD-10-CM | POA: Diagnosis not present

## 2017-08-17 DIAGNOSIS — N2581 Secondary hyperparathyroidism of renal origin: Secondary | ICD-10-CM | POA: Diagnosis not present

## 2017-08-17 DIAGNOSIS — E1129 Type 2 diabetes mellitus with other diabetic kidney complication: Secondary | ICD-10-CM | POA: Diagnosis not present

## 2017-08-18 DIAGNOSIS — E1122 Type 2 diabetes mellitus with diabetic chronic kidney disease: Secondary | ICD-10-CM | POA: Diagnosis not present

## 2017-08-18 DIAGNOSIS — I2510 Atherosclerotic heart disease of native coronary artery without angina pectoris: Secondary | ICD-10-CM | POA: Diagnosis not present

## 2017-08-18 DIAGNOSIS — I770 Arteriovenous fistula, acquired: Secondary | ICD-10-CM | POA: Diagnosis not present

## 2017-08-18 DIAGNOSIS — Z992 Dependence on renal dialysis: Secondary | ICD-10-CM | POA: Diagnosis not present

## 2017-08-18 DIAGNOSIS — Z87891 Personal history of nicotine dependence: Secondary | ICD-10-CM | POA: Diagnosis not present

## 2017-08-18 DIAGNOSIS — Z7982 Long term (current) use of aspirin: Secondary | ICD-10-CM | POA: Diagnosis not present

## 2017-08-18 DIAGNOSIS — Z794 Long term (current) use of insulin: Secondary | ICD-10-CM | POA: Diagnosis not present

## 2017-08-18 DIAGNOSIS — N186 End stage renal disease: Secondary | ICD-10-CM | POA: Diagnosis not present

## 2017-08-18 DIAGNOSIS — D649 Anemia, unspecified: Secondary | ICD-10-CM | POA: Diagnosis not present

## 2017-08-18 DIAGNOSIS — I6509 Occlusion and stenosis of unspecified vertebral artery: Secondary | ICD-10-CM | POA: Diagnosis not present

## 2017-08-18 DIAGNOSIS — I5042 Chronic combined systolic (congestive) and diastolic (congestive) heart failure: Secondary | ICD-10-CM | POA: Diagnosis not present

## 2017-08-18 DIAGNOSIS — I771 Stricture of artery: Secondary | ICD-10-CM | POA: Diagnosis not present

## 2017-08-18 DIAGNOSIS — I130 Hypertensive heart and chronic kidney disease with heart failure and stage 1 through stage 4 chronic kidney disease, or unspecified chronic kidney disease: Secondary | ICD-10-CM | POA: Diagnosis not present

## 2017-08-18 DIAGNOSIS — E785 Hyperlipidemia, unspecified: Secondary | ICD-10-CM | POA: Diagnosis not present

## 2017-08-19 DIAGNOSIS — N2581 Secondary hyperparathyroidism of renal origin: Secondary | ICD-10-CM | POA: Diagnosis not present

## 2017-08-19 DIAGNOSIS — D689 Coagulation defect, unspecified: Secondary | ICD-10-CM | POA: Diagnosis not present

## 2017-08-19 DIAGNOSIS — T8249XD Other complication of vascular dialysis catheter, subsequent encounter: Secondary | ICD-10-CM | POA: Diagnosis not present

## 2017-08-19 DIAGNOSIS — D631 Anemia in chronic kidney disease: Secondary | ICD-10-CM | POA: Diagnosis not present

## 2017-08-19 DIAGNOSIS — N186 End stage renal disease: Secondary | ICD-10-CM | POA: Diagnosis not present

## 2017-08-19 DIAGNOSIS — D509 Iron deficiency anemia, unspecified: Secondary | ICD-10-CM | POA: Diagnosis not present

## 2017-08-19 DIAGNOSIS — E1129 Type 2 diabetes mellitus with other diabetic kidney complication: Secondary | ICD-10-CM | POA: Diagnosis not present

## 2017-08-22 DIAGNOSIS — D509 Iron deficiency anemia, unspecified: Secondary | ICD-10-CM | POA: Diagnosis not present

## 2017-08-22 DIAGNOSIS — T8249XD Other complication of vascular dialysis catheter, subsequent encounter: Secondary | ICD-10-CM | POA: Diagnosis not present

## 2017-08-22 DIAGNOSIS — D631 Anemia in chronic kidney disease: Secondary | ICD-10-CM | POA: Diagnosis not present

## 2017-08-22 DIAGNOSIS — N2581 Secondary hyperparathyroidism of renal origin: Secondary | ICD-10-CM | POA: Diagnosis not present

## 2017-08-22 DIAGNOSIS — N186 End stage renal disease: Secondary | ICD-10-CM | POA: Diagnosis not present

## 2017-08-22 DIAGNOSIS — E1129 Type 2 diabetes mellitus with other diabetic kidney complication: Secondary | ICD-10-CM | POA: Diagnosis not present

## 2017-08-22 DIAGNOSIS — D689 Coagulation defect, unspecified: Secondary | ICD-10-CM | POA: Diagnosis not present

## 2017-08-23 DIAGNOSIS — E1142 Type 2 diabetes mellitus with diabetic polyneuropathy: Secondary | ICD-10-CM | POA: Diagnosis not present

## 2017-08-24 DIAGNOSIS — D631 Anemia in chronic kidney disease: Secondary | ICD-10-CM | POA: Diagnosis not present

## 2017-08-24 DIAGNOSIS — T8249XD Other complication of vascular dialysis catheter, subsequent encounter: Secondary | ICD-10-CM | POA: Diagnosis not present

## 2017-08-24 DIAGNOSIS — N186 End stage renal disease: Secondary | ICD-10-CM | POA: Diagnosis not present

## 2017-08-24 DIAGNOSIS — D509 Iron deficiency anemia, unspecified: Secondary | ICD-10-CM | POA: Diagnosis not present

## 2017-08-24 DIAGNOSIS — E1129 Type 2 diabetes mellitus with other diabetic kidney complication: Secondary | ICD-10-CM | POA: Diagnosis not present

## 2017-08-24 DIAGNOSIS — D689 Coagulation defect, unspecified: Secondary | ICD-10-CM | POA: Diagnosis not present

## 2017-08-24 DIAGNOSIS — N2581 Secondary hyperparathyroidism of renal origin: Secondary | ICD-10-CM | POA: Diagnosis not present

## 2017-08-26 DIAGNOSIS — E1129 Type 2 diabetes mellitus with other diabetic kidney complication: Secondary | ICD-10-CM | POA: Diagnosis not present

## 2017-08-26 DIAGNOSIS — D509 Iron deficiency anemia, unspecified: Secondary | ICD-10-CM | POA: Diagnosis not present

## 2017-08-26 DIAGNOSIS — N186 End stage renal disease: Secondary | ICD-10-CM | POA: Diagnosis not present

## 2017-08-26 DIAGNOSIS — D689 Coagulation defect, unspecified: Secondary | ICD-10-CM | POA: Diagnosis not present

## 2017-08-26 DIAGNOSIS — D631 Anemia in chronic kidney disease: Secondary | ICD-10-CM | POA: Diagnosis not present

## 2017-08-26 DIAGNOSIS — N2581 Secondary hyperparathyroidism of renal origin: Secondary | ICD-10-CM | POA: Diagnosis not present

## 2017-08-26 DIAGNOSIS — T8249XD Other complication of vascular dialysis catheter, subsequent encounter: Secondary | ICD-10-CM | POA: Diagnosis not present

## 2017-08-29 DIAGNOSIS — D509 Iron deficiency anemia, unspecified: Secondary | ICD-10-CM | POA: Diagnosis not present

## 2017-08-29 DIAGNOSIS — N2581 Secondary hyperparathyroidism of renal origin: Secondary | ICD-10-CM | POA: Diagnosis not present

## 2017-08-29 DIAGNOSIS — D689 Coagulation defect, unspecified: Secondary | ICD-10-CM | POA: Diagnosis not present

## 2017-08-29 DIAGNOSIS — N186 End stage renal disease: Secondary | ICD-10-CM | POA: Diagnosis not present

## 2017-08-29 DIAGNOSIS — D631 Anemia in chronic kidney disease: Secondary | ICD-10-CM | POA: Diagnosis not present

## 2017-08-29 DIAGNOSIS — E1129 Type 2 diabetes mellitus with other diabetic kidney complication: Secondary | ICD-10-CM | POA: Diagnosis not present

## 2017-08-29 DIAGNOSIS — T8249XD Other complication of vascular dialysis catheter, subsequent encounter: Secondary | ICD-10-CM | POA: Diagnosis not present

## 2017-08-31 DIAGNOSIS — E1129 Type 2 diabetes mellitus with other diabetic kidney complication: Secondary | ICD-10-CM | POA: Diagnosis not present

## 2017-08-31 DIAGNOSIS — T8249XD Other complication of vascular dialysis catheter, subsequent encounter: Secondary | ICD-10-CM | POA: Diagnosis not present

## 2017-08-31 DIAGNOSIS — N2581 Secondary hyperparathyroidism of renal origin: Secondary | ICD-10-CM | POA: Diagnosis not present

## 2017-08-31 DIAGNOSIS — D509 Iron deficiency anemia, unspecified: Secondary | ICD-10-CM | POA: Diagnosis not present

## 2017-08-31 DIAGNOSIS — N186 End stage renal disease: Secondary | ICD-10-CM | POA: Diagnosis not present

## 2017-08-31 DIAGNOSIS — D631 Anemia in chronic kidney disease: Secondary | ICD-10-CM | POA: Diagnosis not present

## 2017-08-31 DIAGNOSIS — D689 Coagulation defect, unspecified: Secondary | ICD-10-CM | POA: Diagnosis not present

## 2017-09-02 DIAGNOSIS — D509 Iron deficiency anemia, unspecified: Secondary | ICD-10-CM | POA: Diagnosis not present

## 2017-09-02 DIAGNOSIS — D631 Anemia in chronic kidney disease: Secondary | ICD-10-CM | POA: Diagnosis not present

## 2017-09-02 DIAGNOSIS — N186 End stage renal disease: Secondary | ICD-10-CM | POA: Diagnosis not present

## 2017-09-02 DIAGNOSIS — D689 Coagulation defect, unspecified: Secondary | ICD-10-CM | POA: Diagnosis not present

## 2017-09-02 DIAGNOSIS — T8249XD Other complication of vascular dialysis catheter, subsequent encounter: Secondary | ICD-10-CM | POA: Diagnosis not present

## 2017-09-02 DIAGNOSIS — N2581 Secondary hyperparathyroidism of renal origin: Secondary | ICD-10-CM | POA: Diagnosis not present

## 2017-09-02 DIAGNOSIS — E1129 Type 2 diabetes mellitus with other diabetic kidney complication: Secondary | ICD-10-CM | POA: Diagnosis not present

## 2017-09-04 NOTE — Progress Notes (Signed)
  POST OPERATIVE OFFICE NOTE    CC:  F/u for surgery  HPI:  This is a 72 y.o. female who is s/p superficialization of her left BC AVF by Dr. Bridgett Larsson on 08/09/17.  She originally had her left BC AVF and right sided IJ TDC placed by Dr. Oneida Alar on 02/10/17.  She presents today for follow up.  She states that she has not had any issues since surgery.  She denies any pain in her hand.  She dialyzes T/T/S at the [facility] on [address].    Allergies  Allergen Reactions  . Baclofen     Severe delirium when given to pateint in Dec 2018.     Current Outpatient Medications  Medication Sig Dispense Refill  . aspirin 81 MG EC tablet Take 1 tablet (81 mg total) by mouth daily. 30 tablet 3  . atorvastatin (LIPITOR) 40 MG tablet Take 1 tablet (40 mg total) by mouth daily at 6 PM. 30 tablet 0  . brimonidine-timolol (COMBIGAN) 0.2-0.5 % ophthalmic solution Place 1 drop into the right eye 2 (two) times daily as needed (for glaucoma).     . calcitRIOL (ROCALTROL) 0.5 MCG capsule Take 1 capsule (0.5 mcg total) by mouth Every Tuesday,Thursday,and Saturday with dialysis.    [name] calcium acetate (PHOSLO) 667 MG capsule Take 667 mg by mouth 3 (three) times daily with meals.    . carvedilol (COREG) 25 MG tablet Take 25 mg by mouth 2 (two) times daily.  3  . hydrALAZINE (APRESOLINE) 25 MG tablet Take 1 tablet (25 mg total) by mouth every 8 (eight) hours as needed (SBP > 170  or DBP > 110). 90 tablet 0  . HYDROcodone-acetaminophen (NORCO/VICODIN) 5-325 MG tablet Take 1 tablet by mouth every 6 (six) hours as needed. 8 tablet 0  . ibuprofen (ADVIL,MOTRIN) 200 MG tablet Take 400 mg by mouth every 6 (six) hours as needed for mild pain.    [name] insulin detemir (LEVEMIR) 100 UNIT/ML injection Inject 0.15 mLs (15 Units total) into the skin at bedtime. (Patient taking differently: Inject 15 Units into the skin at bedtime. ) 10 mL 11  . multivitamin (RENA-VIT) TABS tablet Take 1 tablet by mouth at bedtime. 30 tablet 0  .  polyethylene glycol (MIRALAX / GLYCOLAX) packet Take 17 g by mouth daily. (Patient taking differently: Take 17 g by mouth daily as needed for mild constipation. ) 14 each 0   No current facility-administered medications for this visit.      ROS:  See HPI  Physical Exam:  Vitals:   09/08/17 1448  BP: 126/71  Pulse: 66  Resp: 16  Temp: 99 F (37.2 C)  SpO2: 94%    Incision:  Well healed.  Extremities:  +thrill/bruit within the fistula that is easily palpable.    Assessment/Plan:  This is a 72 y.o. female who is s/p: superficialization of left BC AVF on 08/09/17 by Dr. Bridgett Larsson  -pt is doing well and the fistula has an excellent thrill/bruit and is easily palpable.  -may start using the fistula.  Once the fistula has been used 3 times without difficulty, we can arrange to remove her tunneled dialysis catheter.  Discussed with pt and she understands. -the fistula is lateral to the incision so it should be stuck lateral to the incision. -she is on daily aspirin.   Leontine Locket, PA-C [facility] [phone]  Clinic MD:  Bridgett Larsson

## 2017-09-05 DIAGNOSIS — N2581 Secondary hyperparathyroidism of renal origin: Secondary | ICD-10-CM | POA: Diagnosis not present

## 2017-09-05 DIAGNOSIS — E1129 Type 2 diabetes mellitus with other diabetic kidney complication: Secondary | ICD-10-CM | POA: Diagnosis not present

## 2017-09-05 DIAGNOSIS — D689 Coagulation defect, unspecified: Secondary | ICD-10-CM | POA: Diagnosis not present

## 2017-09-05 DIAGNOSIS — T8249XD Other complication of vascular dialysis catheter, subsequent encounter: Secondary | ICD-10-CM | POA: Diagnosis not present

## 2017-09-05 DIAGNOSIS — D631 Anemia in chronic kidney disease: Secondary | ICD-10-CM | POA: Diagnosis not present

## 2017-09-05 DIAGNOSIS — D509 Iron deficiency anemia, unspecified: Secondary | ICD-10-CM | POA: Diagnosis not present

## 2017-09-05 DIAGNOSIS — N186 End stage renal disease: Secondary | ICD-10-CM | POA: Diagnosis not present

## 2017-09-06 DIAGNOSIS — I771 Stricture of artery: Secondary | ICD-10-CM | POA: Diagnosis not present

## 2017-09-06 DIAGNOSIS — Z992 Dependence on renal dialysis: Secondary | ICD-10-CM | POA: Diagnosis not present

## 2017-09-06 DIAGNOSIS — I6509 Occlusion and stenosis of unspecified vertebral artery: Secondary | ICD-10-CM | POA: Diagnosis not present

## 2017-09-06 DIAGNOSIS — Z87891 Personal history of nicotine dependence: Secondary | ICD-10-CM | POA: Diagnosis not present

## 2017-09-06 DIAGNOSIS — D649 Anemia, unspecified: Secondary | ICD-10-CM | POA: Diagnosis not present

## 2017-09-06 DIAGNOSIS — I130 Hypertensive heart and chronic kidney disease with heart failure and stage 1 through stage 4 chronic kidney disease, or unspecified chronic kidney disease: Secondary | ICD-10-CM | POA: Diagnosis not present

## 2017-09-06 DIAGNOSIS — I5042 Chronic combined systolic (congestive) and diastolic (congestive) heart failure: Secondary | ICD-10-CM | POA: Diagnosis not present

## 2017-09-06 DIAGNOSIS — I770 Arteriovenous fistula, acquired: Secondary | ICD-10-CM | POA: Diagnosis not present

## 2017-09-06 DIAGNOSIS — I2510 Atherosclerotic heart disease of native coronary artery without angina pectoris: Secondary | ICD-10-CM | POA: Diagnosis not present

## 2017-09-06 DIAGNOSIS — Z794 Long term (current) use of insulin: Secondary | ICD-10-CM | POA: Diagnosis not present

## 2017-09-06 DIAGNOSIS — N186 End stage renal disease: Secondary | ICD-10-CM | POA: Diagnosis not present

## 2017-09-06 DIAGNOSIS — E1122 Type 2 diabetes mellitus with diabetic chronic kidney disease: Secondary | ICD-10-CM | POA: Diagnosis not present

## 2017-09-06 DIAGNOSIS — Z7982 Long term (current) use of aspirin: Secondary | ICD-10-CM | POA: Diagnosis not present

## 2017-09-06 DIAGNOSIS — E785 Hyperlipidemia, unspecified: Secondary | ICD-10-CM | POA: Diagnosis not present

## 2017-09-07 DIAGNOSIS — D631 Anemia in chronic kidney disease: Secondary | ICD-10-CM | POA: Diagnosis not present

## 2017-09-07 DIAGNOSIS — D689 Coagulation defect, unspecified: Secondary | ICD-10-CM | POA: Diagnosis not present

## 2017-09-07 DIAGNOSIS — Z992 Dependence on renal dialysis: Secondary | ICD-10-CM | POA: Diagnosis not present

## 2017-09-07 DIAGNOSIS — E1129 Type 2 diabetes mellitus with other diabetic kidney complication: Secondary | ICD-10-CM | POA: Diagnosis not present

## 2017-09-07 DIAGNOSIS — N186 End stage renal disease: Secondary | ICD-10-CM | POA: Diagnosis not present

## 2017-09-07 DIAGNOSIS — D509 Iron deficiency anemia, unspecified: Secondary | ICD-10-CM | POA: Diagnosis not present

## 2017-09-07 DIAGNOSIS — T8249XD Other complication of vascular dialysis catheter, subsequent encounter: Secondary | ICD-10-CM | POA: Diagnosis not present

## 2017-09-07 DIAGNOSIS — N2581 Secondary hyperparathyroidism of renal origin: Secondary | ICD-10-CM | POA: Diagnosis not present

## 2017-09-07 DIAGNOSIS — E1122 Type 2 diabetes mellitus with diabetic chronic kidney disease: Secondary | ICD-10-CM | POA: Diagnosis not present

## 2017-09-08 ENCOUNTER — Ambulatory Visit (INDEPENDENT_AMBULATORY_CARE_PROVIDER_SITE_OTHER): Payer: Self-pay | Admitting: Vascular Surgery

## 2017-09-08 ENCOUNTER — Encounter: Payer: Self-pay | Admitting: Vascular Surgery

## 2017-09-08 VITALS — BP 126/71 | HR 66 | Temp 99.0°F | Resp 16 | Ht 63.0 in | Wt 178.0 lb

## 2017-09-08 DIAGNOSIS — N186 End stage renal disease: Secondary | ICD-10-CM

## 2017-09-08 DIAGNOSIS — E1122 Type 2 diabetes mellitus with diabetic chronic kidney disease: Secondary | ICD-10-CM | POA: Diagnosis not present

## 2017-09-08 DIAGNOSIS — Z992 Dependence on renal dialysis: Secondary | ICD-10-CM

## 2017-09-09 DIAGNOSIS — Z992 Dependence on renal dialysis: Secondary | ICD-10-CM | POA: Diagnosis not present

## 2017-09-09 DIAGNOSIS — D631 Anemia in chronic kidney disease: Secondary | ICD-10-CM | POA: Diagnosis not present

## 2017-09-09 DIAGNOSIS — T8249XD Other complication of vascular dialysis catheter, subsequent encounter: Secondary | ICD-10-CM | POA: Diagnosis not present

## 2017-09-09 DIAGNOSIS — E1129 Type 2 diabetes mellitus with other diabetic kidney complication: Secondary | ICD-10-CM | POA: Diagnosis not present

## 2017-09-09 DIAGNOSIS — N186 End stage renal disease: Secondary | ICD-10-CM | POA: Diagnosis not present

## 2017-09-09 DIAGNOSIS — D509 Iron deficiency anemia, unspecified: Secondary | ICD-10-CM | POA: Diagnosis not present

## 2017-09-09 DIAGNOSIS — D689 Coagulation defect, unspecified: Secondary | ICD-10-CM | POA: Diagnosis not present

## 2017-09-09 DIAGNOSIS — Z283 Underimmunization status: Secondary | ICD-10-CM | POA: Diagnosis not present

## 2017-09-09 DIAGNOSIS — N2581 Secondary hyperparathyroidism of renal origin: Secondary | ICD-10-CM | POA: Diagnosis not present

## 2017-09-12 DIAGNOSIS — E1129 Type 2 diabetes mellitus with other diabetic kidney complication: Secondary | ICD-10-CM | POA: Diagnosis not present

## 2017-09-12 DIAGNOSIS — D631 Anemia in chronic kidney disease: Secondary | ICD-10-CM | POA: Diagnosis not present

## 2017-09-12 DIAGNOSIS — T8249XD Other complication of vascular dialysis catheter, subsequent encounter: Secondary | ICD-10-CM | POA: Diagnosis not present

## 2017-09-12 DIAGNOSIS — D689 Coagulation defect, unspecified: Secondary | ICD-10-CM | POA: Diagnosis not present

## 2017-09-12 DIAGNOSIS — Z992 Dependence on renal dialysis: Secondary | ICD-10-CM | POA: Diagnosis not present

## 2017-09-12 DIAGNOSIS — N2581 Secondary hyperparathyroidism of renal origin: Secondary | ICD-10-CM | POA: Diagnosis not present

## 2017-09-12 DIAGNOSIS — Z283 Underimmunization status: Secondary | ICD-10-CM | POA: Diagnosis not present

## 2017-09-12 DIAGNOSIS — N186 End stage renal disease: Secondary | ICD-10-CM | POA: Diagnosis not present

## 2017-09-12 DIAGNOSIS — D509 Iron deficiency anemia, unspecified: Secondary | ICD-10-CM | POA: Diagnosis not present

## 2017-09-13 DIAGNOSIS — Z7982 Long term (current) use of aspirin: Secondary | ICD-10-CM | POA: Diagnosis not present

## 2017-09-13 DIAGNOSIS — E785 Hyperlipidemia, unspecified: Secondary | ICD-10-CM | POA: Diagnosis not present

## 2017-09-13 DIAGNOSIS — I771 Stricture of artery: Secondary | ICD-10-CM | POA: Diagnosis not present

## 2017-09-13 DIAGNOSIS — I5042 Chronic combined systolic (congestive) and diastolic (congestive) heart failure: Secondary | ICD-10-CM | POA: Diagnosis not present

## 2017-09-13 DIAGNOSIS — Z794 Long term (current) use of insulin: Secondary | ICD-10-CM | POA: Diagnosis not present

## 2017-09-13 DIAGNOSIS — I770 Arteriovenous fistula, acquired: Secondary | ICD-10-CM | POA: Diagnosis not present

## 2017-09-13 DIAGNOSIS — I6509 Occlusion and stenosis of unspecified vertebral artery: Secondary | ICD-10-CM | POA: Diagnosis not present

## 2017-09-13 DIAGNOSIS — I130 Hypertensive heart and chronic kidney disease with heart failure and stage 1 through stage 4 chronic kidney disease, or unspecified chronic kidney disease: Secondary | ICD-10-CM | POA: Diagnosis not present

## 2017-09-13 DIAGNOSIS — D649 Anemia, unspecified: Secondary | ICD-10-CM | POA: Diagnosis not present

## 2017-09-13 DIAGNOSIS — Z992 Dependence on renal dialysis: Secondary | ICD-10-CM | POA: Diagnosis not present

## 2017-09-13 DIAGNOSIS — I2510 Atherosclerotic heart disease of native coronary artery without angina pectoris: Secondary | ICD-10-CM | POA: Diagnosis not present

## 2017-09-13 DIAGNOSIS — N186 End stage renal disease: Secondary | ICD-10-CM | POA: Diagnosis not present

## 2017-09-13 DIAGNOSIS — Z87891 Personal history of nicotine dependence: Secondary | ICD-10-CM | POA: Diagnosis not present

## 2017-09-13 DIAGNOSIS — E1122 Type 2 diabetes mellitus with diabetic chronic kidney disease: Secondary | ICD-10-CM | POA: Diagnosis not present

## 2017-09-14 DIAGNOSIS — N186 End stage renal disease: Secondary | ICD-10-CM | POA: Diagnosis not present

## 2017-09-14 DIAGNOSIS — E1129 Type 2 diabetes mellitus with other diabetic kidney complication: Secondary | ICD-10-CM | POA: Diagnosis not present

## 2017-09-14 DIAGNOSIS — N2581 Secondary hyperparathyroidism of renal origin: Secondary | ICD-10-CM | POA: Diagnosis not present

## 2017-09-14 DIAGNOSIS — T8249XD Other complication of vascular dialysis catheter, subsequent encounter: Secondary | ICD-10-CM | POA: Diagnosis not present

## 2017-09-14 DIAGNOSIS — D509 Iron deficiency anemia, unspecified: Secondary | ICD-10-CM | POA: Diagnosis not present

## 2017-09-14 DIAGNOSIS — D631 Anemia in chronic kidney disease: Secondary | ICD-10-CM | POA: Diagnosis not present

## 2017-09-14 DIAGNOSIS — Z283 Underimmunization status: Secondary | ICD-10-CM | POA: Diagnosis not present

## 2017-09-14 DIAGNOSIS — D689 Coagulation defect, unspecified: Secondary | ICD-10-CM | POA: Diagnosis not present

## 2017-09-14 DIAGNOSIS — Z992 Dependence on renal dialysis: Secondary | ICD-10-CM | POA: Diagnosis not present

## 2017-09-16 DIAGNOSIS — E1129 Type 2 diabetes mellitus with other diabetic kidney complication: Secondary | ICD-10-CM | POA: Diagnosis not present

## 2017-09-16 DIAGNOSIS — D631 Anemia in chronic kidney disease: Secondary | ICD-10-CM | POA: Diagnosis not present

## 2017-09-16 DIAGNOSIS — D689 Coagulation defect, unspecified: Secondary | ICD-10-CM | POA: Diagnosis not present

## 2017-09-16 DIAGNOSIS — T8249XD Other complication of vascular dialysis catheter, subsequent encounter: Secondary | ICD-10-CM | POA: Diagnosis not present

## 2017-09-16 DIAGNOSIS — Z992 Dependence on renal dialysis: Secondary | ICD-10-CM | POA: Diagnosis not present

## 2017-09-16 DIAGNOSIS — Z283 Underimmunization status: Secondary | ICD-10-CM | POA: Diagnosis not present

## 2017-09-16 DIAGNOSIS — D509 Iron deficiency anemia, unspecified: Secondary | ICD-10-CM | POA: Diagnosis not present

## 2017-09-16 DIAGNOSIS — N186 End stage renal disease: Secondary | ICD-10-CM | POA: Diagnosis not present

## 2017-09-16 DIAGNOSIS — N2581 Secondary hyperparathyroidism of renal origin: Secondary | ICD-10-CM | POA: Diagnosis not present

## 2017-09-19 DIAGNOSIS — N186 End stage renal disease: Secondary | ICD-10-CM | POA: Diagnosis not present

## 2017-09-19 DIAGNOSIS — D509 Iron deficiency anemia, unspecified: Secondary | ICD-10-CM | POA: Diagnosis not present

## 2017-09-19 DIAGNOSIS — D631 Anemia in chronic kidney disease: Secondary | ICD-10-CM | POA: Diagnosis not present

## 2017-09-19 DIAGNOSIS — N2581 Secondary hyperparathyroidism of renal origin: Secondary | ICD-10-CM | POA: Diagnosis not present

## 2017-09-19 DIAGNOSIS — E1129 Type 2 diabetes mellitus with other diabetic kidney complication: Secondary | ICD-10-CM | POA: Diagnosis not present

## 2017-09-19 DIAGNOSIS — Z283 Underimmunization status: Secondary | ICD-10-CM | POA: Diagnosis not present

## 2017-09-19 DIAGNOSIS — Z992 Dependence on renal dialysis: Secondary | ICD-10-CM | POA: Diagnosis not present

## 2017-09-19 DIAGNOSIS — T8249XD Other complication of vascular dialysis catheter, subsequent encounter: Secondary | ICD-10-CM | POA: Diagnosis not present

## 2017-09-19 DIAGNOSIS — D689 Coagulation defect, unspecified: Secondary | ICD-10-CM | POA: Diagnosis not present

## 2017-09-20 DIAGNOSIS — Z87891 Personal history of nicotine dependence: Secondary | ICD-10-CM | POA: Diagnosis not present

## 2017-09-20 DIAGNOSIS — D649 Anemia, unspecified: Secondary | ICD-10-CM | POA: Diagnosis not present

## 2017-09-20 DIAGNOSIS — I5042 Chronic combined systolic (congestive) and diastolic (congestive) heart failure: Secondary | ICD-10-CM | POA: Diagnosis not present

## 2017-09-20 DIAGNOSIS — N186 End stage renal disease: Secondary | ICD-10-CM | POA: Diagnosis not present

## 2017-09-20 DIAGNOSIS — I6509 Occlusion and stenosis of unspecified vertebral artery: Secondary | ICD-10-CM | POA: Diagnosis not present

## 2017-09-20 DIAGNOSIS — E1122 Type 2 diabetes mellitus with diabetic chronic kidney disease: Secondary | ICD-10-CM | POA: Diagnosis not present

## 2017-09-20 DIAGNOSIS — Z7982 Long term (current) use of aspirin: Secondary | ICD-10-CM | POA: Diagnosis not present

## 2017-09-20 DIAGNOSIS — I130 Hypertensive heart and chronic kidney disease with heart failure and stage 1 through stage 4 chronic kidney disease, or unspecified chronic kidney disease: Secondary | ICD-10-CM | POA: Diagnosis not present

## 2017-09-20 DIAGNOSIS — Z992 Dependence on renal dialysis: Secondary | ICD-10-CM | POA: Diagnosis not present

## 2017-09-20 DIAGNOSIS — E785 Hyperlipidemia, unspecified: Secondary | ICD-10-CM | POA: Diagnosis not present

## 2017-09-20 DIAGNOSIS — I771 Stricture of artery: Secondary | ICD-10-CM | POA: Diagnosis not present

## 2017-09-20 DIAGNOSIS — I2510 Atherosclerotic heart disease of native coronary artery without angina pectoris: Secondary | ICD-10-CM | POA: Diagnosis not present

## 2017-09-20 DIAGNOSIS — Z794 Long term (current) use of insulin: Secondary | ICD-10-CM | POA: Diagnosis not present

## 2017-09-20 DIAGNOSIS — I770 Arteriovenous fistula, acquired: Secondary | ICD-10-CM | POA: Diagnosis not present

## 2017-09-21 DIAGNOSIS — D689 Coagulation defect, unspecified: Secondary | ICD-10-CM | POA: Diagnosis not present

## 2017-09-21 DIAGNOSIS — D631 Anemia in chronic kidney disease: Secondary | ICD-10-CM | POA: Diagnosis not present

## 2017-09-21 DIAGNOSIS — E1129 Type 2 diabetes mellitus with other diabetic kidney complication: Secondary | ICD-10-CM | POA: Diagnosis not present

## 2017-09-21 DIAGNOSIS — D509 Iron deficiency anemia, unspecified: Secondary | ICD-10-CM | POA: Diagnosis not present

## 2017-09-21 DIAGNOSIS — N186 End stage renal disease: Secondary | ICD-10-CM | POA: Diagnosis not present

## 2017-09-21 DIAGNOSIS — Z283 Underimmunization status: Secondary | ICD-10-CM | POA: Diagnosis not present

## 2017-09-21 DIAGNOSIS — N2581 Secondary hyperparathyroidism of renal origin: Secondary | ICD-10-CM | POA: Diagnosis not present

## 2017-09-21 DIAGNOSIS — T8249XD Other complication of vascular dialysis catheter, subsequent encounter: Secondary | ICD-10-CM | POA: Diagnosis not present

## 2017-09-21 DIAGNOSIS — Z992 Dependence on renal dialysis: Secondary | ICD-10-CM | POA: Diagnosis not present

## 2017-09-23 DIAGNOSIS — N2581 Secondary hyperparathyroidism of renal origin: Secondary | ICD-10-CM | POA: Diagnosis not present

## 2017-09-23 DIAGNOSIS — E1129 Type 2 diabetes mellitus with other diabetic kidney complication: Secondary | ICD-10-CM | POA: Diagnosis not present

## 2017-09-23 DIAGNOSIS — Z283 Underimmunization status: Secondary | ICD-10-CM | POA: Diagnosis not present

## 2017-09-23 DIAGNOSIS — D631 Anemia in chronic kidney disease: Secondary | ICD-10-CM | POA: Diagnosis not present

## 2017-09-23 DIAGNOSIS — Z992 Dependence on renal dialysis: Secondary | ICD-10-CM | POA: Diagnosis not present

## 2017-09-23 DIAGNOSIS — T8249XD Other complication of vascular dialysis catheter, subsequent encounter: Secondary | ICD-10-CM | POA: Diagnosis not present

## 2017-09-23 DIAGNOSIS — D509 Iron deficiency anemia, unspecified: Secondary | ICD-10-CM | POA: Diagnosis not present

## 2017-09-23 DIAGNOSIS — D689 Coagulation defect, unspecified: Secondary | ICD-10-CM | POA: Diagnosis not present

## 2017-09-23 DIAGNOSIS — N186 End stage renal disease: Secondary | ICD-10-CM | POA: Diagnosis not present

## 2017-09-26 DIAGNOSIS — E1129 Type 2 diabetes mellitus with other diabetic kidney complication: Secondary | ICD-10-CM | POA: Diagnosis not present

## 2017-09-26 DIAGNOSIS — D509 Iron deficiency anemia, unspecified: Secondary | ICD-10-CM | POA: Diagnosis not present

## 2017-09-26 DIAGNOSIS — D689 Coagulation defect, unspecified: Secondary | ICD-10-CM | POA: Diagnosis not present

## 2017-09-26 DIAGNOSIS — N2581 Secondary hyperparathyroidism of renal origin: Secondary | ICD-10-CM | POA: Diagnosis not present

## 2017-09-26 DIAGNOSIS — N186 End stage renal disease: Secondary | ICD-10-CM | POA: Diagnosis not present

## 2017-09-26 DIAGNOSIS — Z992 Dependence on renal dialysis: Secondary | ICD-10-CM | POA: Diagnosis not present

## 2017-09-26 DIAGNOSIS — Z283 Underimmunization status: Secondary | ICD-10-CM | POA: Diagnosis not present

## 2017-09-26 DIAGNOSIS — T8249XD Other complication of vascular dialysis catheter, subsequent encounter: Secondary | ICD-10-CM | POA: Diagnosis not present

## 2017-09-26 DIAGNOSIS — D631 Anemia in chronic kidney disease: Secondary | ICD-10-CM | POA: Diagnosis not present

## 2017-09-27 DIAGNOSIS — N186 End stage renal disease: Secondary | ICD-10-CM | POA: Diagnosis not present

## 2017-09-27 DIAGNOSIS — E785 Hyperlipidemia, unspecified: Secondary | ICD-10-CM | POA: Diagnosis not present

## 2017-09-27 DIAGNOSIS — I130 Hypertensive heart and chronic kidney disease with heart failure and stage 1 through stage 4 chronic kidney disease, or unspecified chronic kidney disease: Secondary | ICD-10-CM | POA: Diagnosis not present

## 2017-09-27 DIAGNOSIS — I770 Arteriovenous fistula, acquired: Secondary | ICD-10-CM | POA: Diagnosis not present

## 2017-09-27 DIAGNOSIS — I6509 Occlusion and stenosis of unspecified vertebral artery: Secondary | ICD-10-CM | POA: Diagnosis not present

## 2017-09-27 DIAGNOSIS — Z794 Long term (current) use of insulin: Secondary | ICD-10-CM | POA: Diagnosis not present

## 2017-09-27 DIAGNOSIS — Z7982 Long term (current) use of aspirin: Secondary | ICD-10-CM | POA: Diagnosis not present

## 2017-09-27 DIAGNOSIS — Z992 Dependence on renal dialysis: Secondary | ICD-10-CM | POA: Diagnosis not present

## 2017-09-27 DIAGNOSIS — I5042 Chronic combined systolic (congestive) and diastolic (congestive) heart failure: Secondary | ICD-10-CM | POA: Diagnosis not present

## 2017-09-27 DIAGNOSIS — I771 Stricture of artery: Secondary | ICD-10-CM | POA: Diagnosis not present

## 2017-09-27 DIAGNOSIS — I2510 Atherosclerotic heart disease of native coronary artery without angina pectoris: Secondary | ICD-10-CM | POA: Diagnosis not present

## 2017-09-27 DIAGNOSIS — Z87891 Personal history of nicotine dependence: Secondary | ICD-10-CM | POA: Diagnosis not present

## 2017-09-27 DIAGNOSIS — E1122 Type 2 diabetes mellitus with diabetic chronic kidney disease: Secondary | ICD-10-CM | POA: Diagnosis not present

## 2017-09-27 DIAGNOSIS — D649 Anemia, unspecified: Secondary | ICD-10-CM | POA: Diagnosis not present

## 2017-09-28 DIAGNOSIS — D631 Anemia in chronic kidney disease: Secondary | ICD-10-CM | POA: Diagnosis not present

## 2017-09-28 DIAGNOSIS — N186 End stage renal disease: Secondary | ICD-10-CM | POA: Diagnosis not present

## 2017-09-28 DIAGNOSIS — T8249XD Other complication of vascular dialysis catheter, subsequent encounter: Secondary | ICD-10-CM | POA: Diagnosis not present

## 2017-09-28 DIAGNOSIS — Z283 Underimmunization status: Secondary | ICD-10-CM | POA: Diagnosis not present

## 2017-09-28 DIAGNOSIS — Z992 Dependence on renal dialysis: Secondary | ICD-10-CM | POA: Diagnosis not present

## 2017-09-28 DIAGNOSIS — E1129 Type 2 diabetes mellitus with other diabetic kidney complication: Secondary | ICD-10-CM | POA: Diagnosis not present

## 2017-09-28 DIAGNOSIS — N2581 Secondary hyperparathyroidism of renal origin: Secondary | ICD-10-CM | POA: Diagnosis not present

## 2017-09-28 DIAGNOSIS — D689 Coagulation defect, unspecified: Secondary | ICD-10-CM | POA: Diagnosis not present

## 2017-09-28 DIAGNOSIS — D509 Iron deficiency anemia, unspecified: Secondary | ICD-10-CM | POA: Diagnosis not present

## 2017-10-03 DIAGNOSIS — N186 End stage renal disease: Secondary | ICD-10-CM | POA: Diagnosis not present

## 2017-10-03 DIAGNOSIS — Z992 Dependence on renal dialysis: Secondary | ICD-10-CM | POA: Diagnosis not present

## 2017-10-03 DIAGNOSIS — D509 Iron deficiency anemia, unspecified: Secondary | ICD-10-CM | POA: Diagnosis not present

## 2017-10-03 DIAGNOSIS — T8249XD Other complication of vascular dialysis catheter, subsequent encounter: Secondary | ICD-10-CM | POA: Diagnosis not present

## 2017-10-03 DIAGNOSIS — E1129 Type 2 diabetes mellitus with other diabetic kidney complication: Secondary | ICD-10-CM | POA: Diagnosis not present

## 2017-10-03 DIAGNOSIS — N2581 Secondary hyperparathyroidism of renal origin: Secondary | ICD-10-CM | POA: Diagnosis not present

## 2017-10-03 DIAGNOSIS — D631 Anemia in chronic kidney disease: Secondary | ICD-10-CM | POA: Diagnosis not present

## 2017-10-03 DIAGNOSIS — D689 Coagulation defect, unspecified: Secondary | ICD-10-CM | POA: Diagnosis not present

## 2017-10-03 DIAGNOSIS — Z283 Underimmunization status: Secondary | ICD-10-CM | POA: Diagnosis not present

## 2017-10-04 DIAGNOSIS — Z794 Long term (current) use of insulin: Secondary | ICD-10-CM | POA: Diagnosis not present

## 2017-10-04 DIAGNOSIS — D649 Anemia, unspecified: Secondary | ICD-10-CM | POA: Diagnosis not present

## 2017-10-04 DIAGNOSIS — I771 Stricture of artery: Secondary | ICD-10-CM | POA: Diagnosis not present

## 2017-10-04 DIAGNOSIS — Z452 Encounter for adjustment and management of vascular access device: Secondary | ICD-10-CM | POA: Diagnosis not present

## 2017-10-04 DIAGNOSIS — Z992 Dependence on renal dialysis: Secondary | ICD-10-CM | POA: Diagnosis not present

## 2017-10-04 DIAGNOSIS — Z87891 Personal history of nicotine dependence: Secondary | ICD-10-CM | POA: Diagnosis not present

## 2017-10-04 DIAGNOSIS — E1122 Type 2 diabetes mellitus with diabetic chronic kidney disease: Secondary | ICD-10-CM | POA: Diagnosis not present

## 2017-10-04 DIAGNOSIS — N186 End stage renal disease: Secondary | ICD-10-CM | POA: Diagnosis not present

## 2017-10-04 DIAGNOSIS — I5042 Chronic combined systolic (congestive) and diastolic (congestive) heart failure: Secondary | ICD-10-CM | POA: Diagnosis not present

## 2017-10-04 DIAGNOSIS — I770 Arteriovenous fistula, acquired: Secondary | ICD-10-CM | POA: Diagnosis not present

## 2017-10-04 DIAGNOSIS — E785 Hyperlipidemia, unspecified: Secondary | ICD-10-CM | POA: Diagnosis not present

## 2017-10-04 DIAGNOSIS — Z7982 Long term (current) use of aspirin: Secondary | ICD-10-CM | POA: Diagnosis not present

## 2017-10-04 DIAGNOSIS — I130 Hypertensive heart and chronic kidney disease with heart failure and stage 1 through stage 4 chronic kidney disease, or unspecified chronic kidney disease: Secondary | ICD-10-CM | POA: Diagnosis not present

## 2017-10-04 DIAGNOSIS — I2510 Atherosclerotic heart disease of native coronary artery without angina pectoris: Secondary | ICD-10-CM | POA: Diagnosis not present

## 2017-10-04 DIAGNOSIS — I6509 Occlusion and stenosis of unspecified vertebral artery: Secondary | ICD-10-CM | POA: Diagnosis not present

## 2017-10-05 DIAGNOSIS — E1129 Type 2 diabetes mellitus with other diabetic kidney complication: Secondary | ICD-10-CM | POA: Diagnosis not present

## 2017-10-05 DIAGNOSIS — N2581 Secondary hyperparathyroidism of renal origin: Secondary | ICD-10-CM | POA: Diagnosis not present

## 2017-10-05 DIAGNOSIS — Z283 Underimmunization status: Secondary | ICD-10-CM | POA: Diagnosis not present

## 2017-10-05 DIAGNOSIS — D689 Coagulation defect, unspecified: Secondary | ICD-10-CM | POA: Diagnosis not present

## 2017-10-05 DIAGNOSIS — Z992 Dependence on renal dialysis: Secondary | ICD-10-CM | POA: Diagnosis not present

## 2017-10-05 DIAGNOSIS — D509 Iron deficiency anemia, unspecified: Secondary | ICD-10-CM | POA: Diagnosis not present

## 2017-10-05 DIAGNOSIS — T8249XD Other complication of vascular dialysis catheter, subsequent encounter: Secondary | ICD-10-CM | POA: Diagnosis not present

## 2017-10-05 DIAGNOSIS — D631 Anemia in chronic kidney disease: Secondary | ICD-10-CM | POA: Diagnosis not present

## 2017-10-05 DIAGNOSIS — N186 End stage renal disease: Secondary | ICD-10-CM | POA: Diagnosis not present

## 2017-10-06 DIAGNOSIS — E1122 Type 2 diabetes mellitus with diabetic chronic kidney disease: Secondary | ICD-10-CM | POA: Diagnosis not present

## 2017-10-06 DIAGNOSIS — Z992 Dependence on renal dialysis: Secondary | ICD-10-CM | POA: Diagnosis not present

## 2017-10-06 DIAGNOSIS — N186 End stage renal disease: Secondary | ICD-10-CM | POA: Diagnosis not present

## 2017-10-07 DIAGNOSIS — Z992 Dependence on renal dialysis: Secondary | ICD-10-CM | POA: Diagnosis not present

## 2017-10-07 DIAGNOSIS — N2581 Secondary hyperparathyroidism of renal origin: Secondary | ICD-10-CM | POA: Diagnosis not present

## 2017-10-07 DIAGNOSIS — Z23 Encounter for immunization: Secondary | ICD-10-CM | POA: Diagnosis not present

## 2017-10-07 DIAGNOSIS — D631 Anemia in chronic kidney disease: Secondary | ICD-10-CM | POA: Diagnosis not present

## 2017-10-07 DIAGNOSIS — E1129 Type 2 diabetes mellitus with other diabetic kidney complication: Secondary | ICD-10-CM | POA: Diagnosis not present

## 2017-10-07 DIAGNOSIS — D509 Iron deficiency anemia, unspecified: Secondary | ICD-10-CM | POA: Diagnosis not present

## 2017-10-07 DIAGNOSIS — N186 End stage renal disease: Secondary | ICD-10-CM | POA: Diagnosis not present

## 2017-10-07 DIAGNOSIS — D689 Coagulation defect, unspecified: Secondary | ICD-10-CM | POA: Diagnosis not present

## 2017-10-10 DIAGNOSIS — N186 End stage renal disease: Secondary | ICD-10-CM | POA: Diagnosis not present

## 2017-10-10 DIAGNOSIS — N2581 Secondary hyperparathyroidism of renal origin: Secondary | ICD-10-CM | POA: Diagnosis not present

## 2017-10-10 DIAGNOSIS — Z23 Encounter for immunization: Secondary | ICD-10-CM | POA: Diagnosis not present

## 2017-10-10 DIAGNOSIS — E1129 Type 2 diabetes mellitus with other diabetic kidney complication: Secondary | ICD-10-CM | POA: Diagnosis not present

## 2017-10-10 DIAGNOSIS — D509 Iron deficiency anemia, unspecified: Secondary | ICD-10-CM | POA: Diagnosis not present

## 2017-10-10 DIAGNOSIS — D689 Coagulation defect, unspecified: Secondary | ICD-10-CM | POA: Diagnosis not present

## 2017-10-10 DIAGNOSIS — Z992 Dependence on renal dialysis: Secondary | ICD-10-CM | POA: Diagnosis not present

## 2017-10-10 DIAGNOSIS — D631 Anemia in chronic kidney disease: Secondary | ICD-10-CM | POA: Diagnosis not present

## 2017-10-12 DIAGNOSIS — Z23 Encounter for immunization: Secondary | ICD-10-CM | POA: Diagnosis not present

## 2017-10-12 DIAGNOSIS — D509 Iron deficiency anemia, unspecified: Secondary | ICD-10-CM | POA: Diagnosis not present

## 2017-10-12 DIAGNOSIS — Z992 Dependence on renal dialysis: Secondary | ICD-10-CM | POA: Diagnosis not present

## 2017-10-12 DIAGNOSIS — N186 End stage renal disease: Secondary | ICD-10-CM | POA: Diagnosis not present

## 2017-10-12 DIAGNOSIS — D631 Anemia in chronic kidney disease: Secondary | ICD-10-CM | POA: Diagnosis not present

## 2017-10-12 DIAGNOSIS — N2581 Secondary hyperparathyroidism of renal origin: Secondary | ICD-10-CM | POA: Diagnosis not present

## 2017-10-12 DIAGNOSIS — D689 Coagulation defect, unspecified: Secondary | ICD-10-CM | POA: Diagnosis not present

## 2017-10-12 DIAGNOSIS — E1129 Type 2 diabetes mellitus with other diabetic kidney complication: Secondary | ICD-10-CM | POA: Diagnosis not present

## 2017-10-14 DIAGNOSIS — N186 End stage renal disease: Secondary | ICD-10-CM | POA: Diagnosis not present

## 2017-10-14 DIAGNOSIS — E1129 Type 2 diabetes mellitus with other diabetic kidney complication: Secondary | ICD-10-CM | POA: Diagnosis not present

## 2017-10-14 DIAGNOSIS — Z992 Dependence on renal dialysis: Secondary | ICD-10-CM | POA: Diagnosis not present

## 2017-10-14 DIAGNOSIS — N2581 Secondary hyperparathyroidism of renal origin: Secondary | ICD-10-CM | POA: Diagnosis not present

## 2017-10-14 DIAGNOSIS — Z23 Encounter for immunization: Secondary | ICD-10-CM | POA: Diagnosis not present

## 2017-10-14 DIAGNOSIS — D689 Coagulation defect, unspecified: Secondary | ICD-10-CM | POA: Diagnosis not present

## 2017-10-14 DIAGNOSIS — D631 Anemia in chronic kidney disease: Secondary | ICD-10-CM | POA: Diagnosis not present

## 2017-10-14 DIAGNOSIS — D509 Iron deficiency anemia, unspecified: Secondary | ICD-10-CM | POA: Diagnosis not present

## 2017-10-17 DIAGNOSIS — Z992 Dependence on renal dialysis: Secondary | ICD-10-CM | POA: Diagnosis not present

## 2017-10-17 DIAGNOSIS — N186 End stage renal disease: Secondary | ICD-10-CM | POA: Diagnosis not present

## 2017-10-17 DIAGNOSIS — Z23 Encounter for immunization: Secondary | ICD-10-CM | POA: Diagnosis not present

## 2017-10-17 DIAGNOSIS — D509 Iron deficiency anemia, unspecified: Secondary | ICD-10-CM | POA: Diagnosis not present

## 2017-10-17 DIAGNOSIS — D689 Coagulation defect, unspecified: Secondary | ICD-10-CM | POA: Diagnosis not present

## 2017-10-17 DIAGNOSIS — E1129 Type 2 diabetes mellitus with other diabetic kidney complication: Secondary | ICD-10-CM | POA: Diagnosis not present

## 2017-10-17 DIAGNOSIS — D631 Anemia in chronic kidney disease: Secondary | ICD-10-CM | POA: Diagnosis not present

## 2017-10-17 DIAGNOSIS — N2581 Secondary hyperparathyroidism of renal origin: Secondary | ICD-10-CM | POA: Diagnosis not present

## 2017-10-18 DIAGNOSIS — G4459 Other complicated headache syndrome: Secondary | ICD-10-CM | POA: Diagnosis not present

## 2017-10-18 DIAGNOSIS — R269 Unspecified abnormalities of gait and mobility: Secondary | ICD-10-CM | POA: Diagnosis not present

## 2017-10-21 DIAGNOSIS — N2581 Secondary hyperparathyroidism of renal origin: Secondary | ICD-10-CM | POA: Diagnosis not present

## 2017-10-21 DIAGNOSIS — D509 Iron deficiency anemia, unspecified: Secondary | ICD-10-CM | POA: Diagnosis not present

## 2017-10-21 DIAGNOSIS — D689 Coagulation defect, unspecified: Secondary | ICD-10-CM | POA: Diagnosis not present

## 2017-10-21 DIAGNOSIS — N186 End stage renal disease: Secondary | ICD-10-CM | POA: Diagnosis not present

## 2017-10-21 DIAGNOSIS — E1129 Type 2 diabetes mellitus with other diabetic kidney complication: Secondary | ICD-10-CM | POA: Diagnosis not present

## 2017-10-21 DIAGNOSIS — Z992 Dependence on renal dialysis: Secondary | ICD-10-CM | POA: Diagnosis not present

## 2017-10-21 DIAGNOSIS — Z23 Encounter for immunization: Secondary | ICD-10-CM | POA: Diagnosis not present

## 2017-10-21 DIAGNOSIS — D631 Anemia in chronic kidney disease: Secondary | ICD-10-CM | POA: Diagnosis not present

## 2017-10-24 DIAGNOSIS — N186 End stage renal disease: Secondary | ICD-10-CM | POA: Diagnosis not present

## 2017-10-24 DIAGNOSIS — D689 Coagulation defect, unspecified: Secondary | ICD-10-CM | POA: Diagnosis not present

## 2017-10-24 DIAGNOSIS — E1129 Type 2 diabetes mellitus with other diabetic kidney complication: Secondary | ICD-10-CM | POA: Diagnosis not present

## 2017-10-24 DIAGNOSIS — D509 Iron deficiency anemia, unspecified: Secondary | ICD-10-CM | POA: Diagnosis not present

## 2017-10-24 DIAGNOSIS — D631 Anemia in chronic kidney disease: Secondary | ICD-10-CM | POA: Diagnosis not present

## 2017-10-24 DIAGNOSIS — N2581 Secondary hyperparathyroidism of renal origin: Secondary | ICD-10-CM | POA: Diagnosis not present

## 2017-10-24 DIAGNOSIS — Z23 Encounter for immunization: Secondary | ICD-10-CM | POA: Diagnosis not present

## 2017-10-24 DIAGNOSIS — Z992 Dependence on renal dialysis: Secondary | ICD-10-CM | POA: Diagnosis not present

## 2017-10-25 DIAGNOSIS — D509 Iron deficiency anemia, unspecified: Secondary | ICD-10-CM | POA: Diagnosis not present

## 2017-10-25 DIAGNOSIS — Z23 Encounter for immunization: Secondary | ICD-10-CM | POA: Diagnosis not present

## 2017-10-25 DIAGNOSIS — D689 Coagulation defect, unspecified: Secondary | ICD-10-CM | POA: Diagnosis not present

## 2017-10-25 DIAGNOSIS — N2581 Secondary hyperparathyroidism of renal origin: Secondary | ICD-10-CM | POA: Diagnosis not present

## 2017-10-25 DIAGNOSIS — D631 Anemia in chronic kidney disease: Secondary | ICD-10-CM | POA: Diagnosis not present

## 2017-10-25 DIAGNOSIS — N186 End stage renal disease: Secondary | ICD-10-CM | POA: Diagnosis not present

## 2017-10-25 DIAGNOSIS — E1129 Type 2 diabetes mellitus with other diabetic kidney complication: Secondary | ICD-10-CM | POA: Diagnosis not present

## 2017-10-25 DIAGNOSIS — Z992 Dependence on renal dialysis: Secondary | ICD-10-CM | POA: Diagnosis not present

## 2017-10-26 DIAGNOSIS — D509 Iron deficiency anemia, unspecified: Secondary | ICD-10-CM | POA: Diagnosis not present

## 2017-10-26 DIAGNOSIS — Z992 Dependence on renal dialysis: Secondary | ICD-10-CM | POA: Diagnosis not present

## 2017-10-26 DIAGNOSIS — Z23 Encounter for immunization: Secondary | ICD-10-CM | POA: Diagnosis not present

## 2017-10-26 DIAGNOSIS — N186 End stage renal disease: Secondary | ICD-10-CM | POA: Diagnosis not present

## 2017-10-26 DIAGNOSIS — E1129 Type 2 diabetes mellitus with other diabetic kidney complication: Secondary | ICD-10-CM | POA: Diagnosis not present

## 2017-10-26 DIAGNOSIS — N2581 Secondary hyperparathyroidism of renal origin: Secondary | ICD-10-CM | POA: Diagnosis not present

## 2017-10-26 DIAGNOSIS — D631 Anemia in chronic kidney disease: Secondary | ICD-10-CM | POA: Diagnosis not present

## 2017-10-26 DIAGNOSIS — D689 Coagulation defect, unspecified: Secondary | ICD-10-CM | POA: Diagnosis not present

## 2017-10-28 DIAGNOSIS — Z992 Dependence on renal dialysis: Secondary | ICD-10-CM | POA: Diagnosis not present

## 2017-10-28 DIAGNOSIS — Z23 Encounter for immunization: Secondary | ICD-10-CM | POA: Diagnosis not present

## 2017-10-28 DIAGNOSIS — D631 Anemia in chronic kidney disease: Secondary | ICD-10-CM | POA: Diagnosis not present

## 2017-10-28 DIAGNOSIS — E1129 Type 2 diabetes mellitus with other diabetic kidney complication: Secondary | ICD-10-CM | POA: Diagnosis not present

## 2017-10-28 DIAGNOSIS — D509 Iron deficiency anemia, unspecified: Secondary | ICD-10-CM | POA: Diagnosis not present

## 2017-10-28 DIAGNOSIS — N186 End stage renal disease: Secondary | ICD-10-CM | POA: Diagnosis not present

## 2017-10-28 DIAGNOSIS — D689 Coagulation defect, unspecified: Secondary | ICD-10-CM | POA: Diagnosis not present

## 2017-10-28 DIAGNOSIS — N2581 Secondary hyperparathyroidism of renal origin: Secondary | ICD-10-CM | POA: Diagnosis not present

## 2017-10-31 DIAGNOSIS — D509 Iron deficiency anemia, unspecified: Secondary | ICD-10-CM | POA: Diagnosis not present

## 2017-10-31 DIAGNOSIS — E1129 Type 2 diabetes mellitus with other diabetic kidney complication: Secondary | ICD-10-CM | POA: Diagnosis not present

## 2017-10-31 DIAGNOSIS — Z992 Dependence on renal dialysis: Secondary | ICD-10-CM | POA: Diagnosis not present

## 2017-10-31 DIAGNOSIS — D689 Coagulation defect, unspecified: Secondary | ICD-10-CM | POA: Diagnosis not present

## 2017-10-31 DIAGNOSIS — N186 End stage renal disease: Secondary | ICD-10-CM | POA: Diagnosis not present

## 2017-10-31 DIAGNOSIS — Z23 Encounter for immunization: Secondary | ICD-10-CM | POA: Diagnosis not present

## 2017-10-31 DIAGNOSIS — D631 Anemia in chronic kidney disease: Secondary | ICD-10-CM | POA: Diagnosis not present

## 2017-10-31 DIAGNOSIS — N2581 Secondary hyperparathyroidism of renal origin: Secondary | ICD-10-CM | POA: Diagnosis not present

## 2017-11-02 DIAGNOSIS — D631 Anemia in chronic kidney disease: Secondary | ICD-10-CM | POA: Diagnosis not present

## 2017-11-02 DIAGNOSIS — D509 Iron deficiency anemia, unspecified: Secondary | ICD-10-CM | POA: Diagnosis not present

## 2017-11-02 DIAGNOSIS — E1129 Type 2 diabetes mellitus with other diabetic kidney complication: Secondary | ICD-10-CM | POA: Diagnosis not present

## 2017-11-02 DIAGNOSIS — N186 End stage renal disease: Secondary | ICD-10-CM | POA: Diagnosis not present

## 2017-11-02 DIAGNOSIS — D689 Coagulation defect, unspecified: Secondary | ICD-10-CM | POA: Diagnosis not present

## 2017-11-02 DIAGNOSIS — Z23 Encounter for immunization: Secondary | ICD-10-CM | POA: Diagnosis not present

## 2017-11-02 DIAGNOSIS — N2581 Secondary hyperparathyroidism of renal origin: Secondary | ICD-10-CM | POA: Diagnosis not present

## 2017-11-02 DIAGNOSIS — Z992 Dependence on renal dialysis: Secondary | ICD-10-CM | POA: Diagnosis not present

## 2017-11-03 DIAGNOSIS — Z992 Dependence on renal dialysis: Secondary | ICD-10-CM | POA: Diagnosis not present

## 2017-11-03 DIAGNOSIS — R2232 Localized swelling, mass and lump, left upper limb: Secondary | ICD-10-CM | POA: Diagnosis not present

## 2017-11-03 DIAGNOSIS — I871 Compression of vein: Secondary | ICD-10-CM | POA: Diagnosis not present

## 2017-11-03 DIAGNOSIS — N186 End stage renal disease: Secondary | ICD-10-CM | POA: Diagnosis not present

## 2017-11-04 DIAGNOSIS — Z23 Encounter for immunization: Secondary | ICD-10-CM | POA: Diagnosis not present

## 2017-11-04 DIAGNOSIS — E1129 Type 2 diabetes mellitus with other diabetic kidney complication: Secondary | ICD-10-CM | POA: Diagnosis not present

## 2017-11-04 DIAGNOSIS — Z992 Dependence on renal dialysis: Secondary | ICD-10-CM | POA: Diagnosis not present

## 2017-11-04 DIAGNOSIS — N186 End stage renal disease: Secondary | ICD-10-CM | POA: Diagnosis not present

## 2017-11-04 DIAGNOSIS — D689 Coagulation defect, unspecified: Secondary | ICD-10-CM | POA: Diagnosis not present

## 2017-11-04 DIAGNOSIS — D631 Anemia in chronic kidney disease: Secondary | ICD-10-CM | POA: Diagnosis not present

## 2017-11-04 DIAGNOSIS — N2581 Secondary hyperparathyroidism of renal origin: Secondary | ICD-10-CM | POA: Diagnosis not present

## 2017-11-04 DIAGNOSIS — D509 Iron deficiency anemia, unspecified: Secondary | ICD-10-CM | POA: Diagnosis not present

## 2017-11-06 DIAGNOSIS — E8770 Fluid overload, unspecified: Secondary | ICD-10-CM | POA: Diagnosis not present

## 2017-11-06 DIAGNOSIS — N186 End stage renal disease: Secondary | ICD-10-CM | POA: Diagnosis not present

## 2017-11-06 DIAGNOSIS — D689 Coagulation defect, unspecified: Secondary | ICD-10-CM | POA: Diagnosis not present

## 2017-11-06 DIAGNOSIS — N2581 Secondary hyperparathyroidism of renal origin: Secondary | ICD-10-CM | POA: Diagnosis not present

## 2017-11-06 DIAGNOSIS — Z992 Dependence on renal dialysis: Secondary | ICD-10-CM | POA: Diagnosis not present

## 2017-11-06 DIAGNOSIS — D631 Anemia in chronic kidney disease: Secondary | ICD-10-CM | POA: Diagnosis not present

## 2017-11-06 DIAGNOSIS — T8249XD Other complication of vascular dialysis catheter, subsequent encounter: Secondary | ICD-10-CM | POA: Diagnosis not present

## 2017-11-06 DIAGNOSIS — E1129 Type 2 diabetes mellitus with other diabetic kidney complication: Secondary | ICD-10-CM | POA: Diagnosis not present

## 2017-11-06 DIAGNOSIS — I10 Essential (primary) hypertension: Secondary | ICD-10-CM | POA: Diagnosis not present

## 2017-11-06 DIAGNOSIS — D509 Iron deficiency anemia, unspecified: Secondary | ICD-10-CM | POA: Diagnosis not present

## 2017-11-07 DIAGNOSIS — I10 Essential (primary) hypertension: Secondary | ICD-10-CM | POA: Diagnosis not present

## 2017-11-07 DIAGNOSIS — D631 Anemia in chronic kidney disease: Secondary | ICD-10-CM | POA: Diagnosis not present

## 2017-11-07 DIAGNOSIS — E8770 Fluid overload, unspecified: Secondary | ICD-10-CM | POA: Diagnosis not present

## 2017-11-07 DIAGNOSIS — D689 Coagulation defect, unspecified: Secondary | ICD-10-CM | POA: Diagnosis not present

## 2017-11-07 DIAGNOSIS — E1129 Type 2 diabetes mellitus with other diabetic kidney complication: Secondary | ICD-10-CM | POA: Diagnosis not present

## 2017-11-07 DIAGNOSIS — T8249XD Other complication of vascular dialysis catheter, subsequent encounter: Secondary | ICD-10-CM | POA: Diagnosis not present

## 2017-11-07 DIAGNOSIS — N186 End stage renal disease: Secondary | ICD-10-CM | POA: Diagnosis not present

## 2017-11-07 DIAGNOSIS — D509 Iron deficiency anemia, unspecified: Secondary | ICD-10-CM | POA: Diagnosis not present

## 2017-11-07 DIAGNOSIS — N2581 Secondary hyperparathyroidism of renal origin: Secondary | ICD-10-CM | POA: Diagnosis not present

## 2017-11-07 DIAGNOSIS — Z992 Dependence on renal dialysis: Secondary | ICD-10-CM | POA: Diagnosis not present

## 2017-11-08 ENCOUNTER — Observation Stay (HOSPITAL_COMMUNITY): Payer: Medicare Other

## 2017-11-08 ENCOUNTER — Inpatient Hospital Stay (HOSPITAL_COMMUNITY)
Admission: EM | Admit: 2017-11-08 | Discharge: 2017-11-11 | DRG: 064 | Disposition: A | Discharge status: Home Health | Payer: Medicare Other | Attending: Internal Medicine | Admitting: Internal Medicine

## 2017-11-08 ENCOUNTER — Encounter (HOSPITAL_COMMUNITY): Payer: Self-pay | Admitting: Emergency Medicine

## 2017-11-08 ENCOUNTER — Other Ambulatory Visit: Payer: Self-pay

## 2017-11-08 DIAGNOSIS — E785 Hyperlipidemia, unspecified: Secondary | ICD-10-CM | POA: Diagnosis present

## 2017-11-08 DIAGNOSIS — E1122 Type 2 diabetes mellitus with diabetic chronic kidney disease: Secondary | ICD-10-CM | POA: Diagnosis not present

## 2017-11-08 DIAGNOSIS — I2510 Atherosclerotic heart disease of native coronary artery without angina pectoris: Secondary | ICD-10-CM | POA: Diagnosis present

## 2017-11-08 DIAGNOSIS — G465 Pure motor lacunar syndrome: Secondary | ICD-10-CM | POA: Diagnosis not present

## 2017-11-08 DIAGNOSIS — M170 Bilateral primary osteoarthritis of knee: Secondary | ICD-10-CM | POA: Diagnosis not present

## 2017-11-08 DIAGNOSIS — D631 Anemia in chronic kidney disease: Secondary | ICD-10-CM | POA: Diagnosis not present

## 2017-11-08 DIAGNOSIS — M50221 Other cervical disc displacement at C4-C5 level: Secondary | ICD-10-CM | POA: Diagnosis not present

## 2017-11-08 DIAGNOSIS — I639 Cerebral infarction, unspecified: Secondary | ICD-10-CM | POA: Diagnosis not present

## 2017-11-08 DIAGNOSIS — I132 Hypertensive heart and chronic kidney disease with heart failure and with stage 5 chronic kidney disease, or end stage renal disease: Secondary | ICD-10-CM | POA: Diagnosis not present

## 2017-11-08 DIAGNOSIS — Z794 Long term (current) use of insulin: Secondary | ICD-10-CM

## 2017-11-08 DIAGNOSIS — H538 Other visual disturbances: Secondary | ICD-10-CM | POA: Diagnosis not present

## 2017-11-08 DIAGNOSIS — E1165 Type 2 diabetes mellitus with hyperglycemia: Secondary | ICD-10-CM | POA: Diagnosis present

## 2017-11-08 DIAGNOSIS — E669 Obesity, unspecified: Secondary | ICD-10-CM | POA: Diagnosis present

## 2017-11-08 DIAGNOSIS — N189 Chronic kidney disease, unspecified: Secondary | ICD-10-CM

## 2017-11-08 DIAGNOSIS — I6339 Cerebral infarction due to thrombosis of other cerebral artery: Secondary | ICD-10-CM | POA: Diagnosis not present

## 2017-11-08 DIAGNOSIS — M4802 Spinal stenosis, cervical region: Secondary | ICD-10-CM | POA: Diagnosis present

## 2017-11-08 DIAGNOSIS — N2581 Secondary hyperparathyroidism of renal origin: Secondary | ICD-10-CM | POA: Diagnosis not present

## 2017-11-08 DIAGNOSIS — I5042 Chronic combined systolic (congestive) and diastolic (congestive) heart failure: Secondary | ICD-10-CM | POA: Diagnosis not present

## 2017-11-08 DIAGNOSIS — I429 Cardiomyopathy, unspecified: Secondary | ICD-10-CM | POA: Diagnosis not present

## 2017-11-08 DIAGNOSIS — N186 End stage renal disease: Secondary | ICD-10-CM | POA: Diagnosis not present

## 2017-11-08 DIAGNOSIS — D329 Benign neoplasm of meninges, unspecified: Secondary | ICD-10-CM | POA: Diagnosis present

## 2017-11-08 DIAGNOSIS — R29701 NIHSS score 1: Secondary | ICD-10-CM | POA: Diagnosis not present

## 2017-11-08 DIAGNOSIS — E1121 Type 2 diabetes mellitus with diabetic nephropathy: Secondary | ICD-10-CM | POA: Diagnosis not present

## 2017-11-08 DIAGNOSIS — E8889 Other specified metabolic disorders: Secondary | ICD-10-CM | POA: Diagnosis present

## 2017-11-08 DIAGNOSIS — Z8249 Family history of ischemic heart disease and other diseases of the circulatory system: Secondary | ICD-10-CM

## 2017-11-08 DIAGNOSIS — Z90710 Acquired absence of both cervix and uterus: Secondary | ICD-10-CM

## 2017-11-08 DIAGNOSIS — I739 Peripheral vascular disease, unspecified: Secondary | ICD-10-CM | POA: Diagnosis not present

## 2017-11-08 DIAGNOSIS — G9520 Unspecified cord compression: Secondary | ICD-10-CM | POA: Diagnosis not present

## 2017-11-08 DIAGNOSIS — Z841 Family history of disorders of kidney and ureter: Secondary | ICD-10-CM

## 2017-11-08 DIAGNOSIS — M899 Disorder of bone, unspecified: Secondary | ICD-10-CM | POA: Diagnosis not present

## 2017-11-08 DIAGNOSIS — G959 Disease of spinal cord, unspecified: Secondary | ICD-10-CM | POA: Diagnosis not present

## 2017-11-08 DIAGNOSIS — Z6833 Body mass index (BMI) 33.0-33.9, adult: Secondary | ICD-10-CM

## 2017-11-08 DIAGNOSIS — I6381 Other cerebral infarction due to occlusion or stenosis of small artery: Secondary | ICD-10-CM | POA: Diagnosis not present

## 2017-11-08 DIAGNOSIS — E1129 Type 2 diabetes mellitus with other diabetic kidney complication: Secondary | ICD-10-CM | POA: Diagnosis present

## 2017-11-08 DIAGNOSIS — Z7982 Long term (current) use of aspirin: Secondary | ICD-10-CM

## 2017-11-08 DIAGNOSIS — R29700 NIHSS score 0: Secondary | ICD-10-CM | POA: Diagnosis not present

## 2017-11-08 DIAGNOSIS — F40240 Claustrophobia: Secondary | ICD-10-CM | POA: Diagnosis present

## 2017-11-08 DIAGNOSIS — Z79899 Other long term (current) drug therapy: Secondary | ICD-10-CM

## 2017-11-08 DIAGNOSIS — Z888 Allergy status to other drugs, medicaments and biological substances status: Secondary | ICD-10-CM

## 2017-11-08 DIAGNOSIS — I6522 Occlusion and stenosis of left carotid artery: Secondary | ICD-10-CM | POA: Diagnosis not present

## 2017-11-08 DIAGNOSIS — E1151 Type 2 diabetes mellitus with diabetic peripheral angiopathy without gangrene: Secondary | ICD-10-CM | POA: Diagnosis present

## 2017-11-08 DIAGNOSIS — Z8349 Family history of other endocrine, nutritional and metabolic diseases: Secondary | ICD-10-CM

## 2017-11-08 DIAGNOSIS — Z87891 Personal history of nicotine dependence: Secondary | ICD-10-CM

## 2017-11-08 DIAGNOSIS — J45909 Unspecified asthma, uncomplicated: Secondary | ICD-10-CM | POA: Diagnosis present

## 2017-11-08 DIAGNOSIS — Z992 Dependence on renal dialysis: Secondary | ICD-10-CM | POA: Diagnosis not present

## 2017-11-08 HISTORY — DX: Disorder of kidney and ureter, unspecified: N28.9

## 2017-11-08 HISTORY — DX: Cerebral infarction, unspecified: I63.9

## 2017-11-08 LAB — I-STAT CHEM 8, ED
BUN: 35 mg/dL — ABNORMAL HIGH (ref 6–20)
CHLORIDE: 96 mmol/L — AB (ref 101–111)
Calcium, Ion: 1.06 mmol/L — ABNORMAL LOW (ref 1.15–1.40)
Creatinine, Ser: 4 mg/dL — ABNORMAL HIGH (ref 0.44–1.00)
Glucose, Bld: 361 mg/dL — ABNORMAL HIGH (ref 65–99)
HCT: 31 % — ABNORMAL LOW (ref 36.0–46.0)
HEMOGLOBIN: 10.5 g/dL — AB (ref 12.0–15.0)
POTASSIUM: 4.3 mmol/L (ref 3.5–5.1)
SODIUM: 137 mmol/L (ref 135–145)
TCO2: 30 mmol/L (ref 22–32)

## 2017-11-08 LAB — COMPREHENSIVE METABOLIC PANEL
ALT: 8 U/L — AB (ref 14–54)
AST: 19 U/L (ref 15–41)
Albumin: 3.6 g/dL (ref 3.5–5.0)
Alkaline Phosphatase: 82 U/L (ref 38–126)
Anion gap: 12 (ref 5–15)
BUN: 34 mg/dL — AB (ref 6–20)
CHLORIDE: 95 mmol/L — AB (ref 101–111)
CO2: 27 mmol/L (ref 22–32)
CREATININE: 4.02 mg/dL — AB (ref 0.44–1.00)
Calcium: 8.8 mg/dL — ABNORMAL LOW (ref 8.9–10.3)
GFR calc Af Amer: 12 mL/min — ABNORMAL LOW (ref >60)
GFR calc non Af Amer: 10 mL/min — ABNORMAL LOW (ref >60)
Glucose, Bld: 358 mg/dL — ABNORMAL HIGH (ref 65–99)
Potassium: 4.2 mmol/L (ref 3.5–5.1)
SODIUM: 134 mmol/L — AB (ref 135–145)
Total Bilirubin: 0.7 mg/dL (ref 0.3–1.2)
Total Protein: 6.6 g/dL (ref 6.5–8.1)

## 2017-11-08 LAB — APTT: APTT: 28 s (ref 24–36)

## 2017-11-08 LAB — DIFFERENTIAL
BASOS ABS: 0 10*3/uL (ref 0.0–0.1)
BASOS PCT: 0 %
Eosinophils Absolute: 0.5 10*3/uL (ref 0.0–0.7)
Eosinophils Relative: 10 %
Lymphocytes Relative: 32 %
Lymphs Abs: 1.5 10*3/uL (ref 0.7–4.0)
Monocytes Absolute: 0.3 10*3/uL (ref 0.1–1.0)
Monocytes Relative: 6 %
NEUTROS ABS: 2.4 10*3/uL (ref 1.7–7.7)
NEUTROS PCT: 52 %

## 2017-11-08 LAB — CBC
HCT: 31.9 % — ABNORMAL LOW (ref 36.0–46.0)
Hemoglobin: 10 g/dL — ABNORMAL LOW (ref 12.0–15.0)
MCH: 31.6 pg (ref 26.0–34.0)
MCHC: 31.3 g/dL (ref 30.0–36.0)
MCV: 100.9 fL — ABNORMAL HIGH (ref 78.0–100.0)
PLATELETS: 181 10*3/uL (ref 150–400)
RBC: 3.16 MIL/uL — ABNORMAL LOW (ref 3.87–5.11)
RDW: 15.7 % — AB (ref 11.5–15.5)
WBC: 4.7 10*3/uL (ref 4.0–10.5)

## 2017-11-08 LAB — CBG MONITORING, ED: Glucose-Capillary: 287 mg/dL — ABNORMAL HIGH (ref 65–99)

## 2017-11-08 LAB — I-STAT TROPONIN, ED: Troponin i, poc: 0.08 ng/mL (ref 0.00–0.08)

## 2017-11-08 LAB — PROTIME-INR
INR: 1.11
PROTHROMBIN TIME: 14.2 s (ref 11.4–15.2)

## 2017-11-08 MED ORDER — ASPIRIN 325 MG PO TABS
325.0000 mg | ORAL_TABLET | Freq: Every day | ORAL | Status: DC
  Administered 2017-11-09: 325 mg via ORAL
  Filled 2017-11-08: qty 1

## 2017-11-08 MED ORDER — CARVEDILOL 25 MG PO TABS
25.0000 mg | ORAL_TABLET | Freq: Two times a day (BID) | ORAL | Status: DC
  Administered 2017-11-09: 25 mg via ORAL
  Filled 2017-11-08: qty 1
  Filled 2017-11-08: qty 2

## 2017-11-08 MED ORDER — ASPIRIN 300 MG RE SUPP: 300.0000 mg | Freq: Every day | RECTAL | Status: DC

## 2017-11-08 MED ORDER — BRIMONIDINE TARTRATE-TIMOLOL 0.2-0.5 % OP SOLN: 1.0000 [drp] | Freq: Two times a day (BID) | OPHTHALMIC | Status: DC | PRN

## 2017-11-08 MED ORDER — ACETAMINOPHEN 650 MG RE SUPP: 650.0000 mg | RECTAL | Status: DC | PRN

## 2017-11-08 MED ORDER — IOPAMIDOL (ISOVUE-370) INJECTION 76%
INTRAVENOUS | Status: AC
  Filled 2017-11-08: qty 50

## 2017-11-08 MED ORDER — HEPARIN SODIUM (PORCINE) 5000 UNIT/ML IJ SOLN
5000.0000 [IU] | Freq: Three times a day (TID) | INTRAMUSCULAR | Status: DC
  Administered 2017-11-09 – 2017-11-11 (×7): 5000 [IU] via SUBCUTANEOUS
  Filled 2017-11-08 (×6): qty 1

## 2017-11-08 MED ORDER — ACETAMINOPHEN 160 MG/5ML PO SOLN: 650.0000 mg | ORAL | Status: DC | PRN

## 2017-11-08 MED ORDER — ASPIRIN 81 MG PO CHEW
324.0000 mg | CHEWABLE_TABLET | Freq: Once | ORAL | Status: AC
  Administered 2017-11-08: 324 mg via ORAL
  Filled 2017-11-08: qty 4

## 2017-11-08 MED ORDER — HYDROCODONE-ACETAMINOPHEN 5-325 MG PO TABS: 1.0000 | ORAL_TABLET | Freq: Four times a day (QID) | ORAL | Status: DC | PRN

## 2017-11-08 MED ORDER — TIMOLOL MALEATE 0.5 % OP SOLN: 1.0000 [drp] | Freq: Two times a day (BID) | OPHTHALMIC | Status: DC | PRN

## 2017-11-08 MED ORDER — IOPAMIDOL (ISOVUE-370) INJECTION 76%
50.0000 mL | Freq: Once | INTRAVENOUS | Status: AC | PRN
  Administered 2017-11-08: 50 mL via INTRAVENOUS

## 2017-11-08 MED ORDER — BRIMONIDINE TARTRATE 0.2 % OP SOLN: 1.0000 [drp] | Freq: Two times a day (BID) | OPHTHALMIC | Status: DC | PRN

## 2017-11-08 MED ORDER — CALCIUM ACETATE (PHOS BINDER) 667 MG PO CAPS
667.0000 mg | ORAL_CAPSULE | ORAL | Status: DC | PRN
  Filled 2017-11-08: qty 1

## 2017-11-08 MED ORDER — INSULIN ASPART 100 UNIT/ML ~~LOC~~ SOLN
0.0000 [IU] | Freq: Three times a day (TID) | SUBCUTANEOUS | Status: DC
  Administered 2017-11-09 – 2017-11-10 (×2): 1 [IU] via SUBCUTANEOUS

## 2017-11-08 MED ORDER — GABAPENTIN 100 MG PO CAPS
100.0000 mg | ORAL_CAPSULE | Freq: Every day | ORAL | Status: DC
  Administered 2017-11-09 – 2017-11-10 (×3): 100 mg via ORAL
  Filled 2017-11-08 (×3): qty 1

## 2017-11-08 MED ORDER — CALCIUM ACETATE (PHOS BINDER) 667 MG PO CAPS
2001.0000 mg | ORAL_CAPSULE | Freq: Three times a day (TID) | ORAL | Status: DC
  Administered 2017-11-09 – 2017-11-11 (×7): 2001 mg via ORAL
  Filled 2017-11-08 (×8): qty 3

## 2017-11-08 MED ORDER — STROKE: EARLY STAGES OF RECOVERY BOOK
Freq: Once | Status: DC
  Filled 2017-11-08: qty 1

## 2017-11-08 MED ORDER — ACETAMINOPHEN 325 MG PO TABS: 650.0000 mg | ORAL_TABLET | ORAL | Status: DC | PRN

## 2017-11-08 MED ORDER — RENA-VITE PO TABS
1.0000 | ORAL_TABLET | Freq: Every day | ORAL | Status: DC
  Administered 2017-11-09 – 2017-11-10 (×3): 1 via ORAL
  Filled 2017-11-08 (×3): qty 1

## 2017-11-08 MED ORDER — ATORVASTATIN CALCIUM 40 MG PO TABS
40.0000 mg | ORAL_TABLET | Freq: Every day | ORAL | Status: DC
  Administered 2017-11-09 – 2017-11-11 (×3): 40 mg via ORAL
  Filled 2017-11-08 (×3): qty 1

## 2017-11-08 MED ORDER — INSULIN DETEMIR 100 UNIT/ML ~~LOC~~ SOLN
15.0000 [IU] | Freq: Every day | SUBCUTANEOUS | Status: DC
  Administered 2017-11-09 – 2017-11-10 (×3): 15 [IU] via SUBCUTANEOUS
  Filled 2017-11-08 (×4): qty 0.15

## 2017-11-08 NOTE — ED Triage Notes (Signed)
Pt to ED from home for stroke-like symptoms - pt had MRI at [facility] today for continued tingling in hands and numbness/pain in R side of head that radiates down into her neck since November or December. Pt has also had intermittent short-term memory loss. Patient currently A&O x 4, no extremity weakness noted. Pt normally ambulates with walker. Dialysis patient T/Th/Sa. Denies pain or new symptoms at this time.

## 2017-11-08 NOTE — H&P (Signed)
History and Physical    Kathleen [name] DQQ:[id number] DOB: 01-22-1946 DOA: 11/08/2017  PCP: Benito Mccreedy, MD  Patient coming from: Home.  Chief Complaint: Abnormal MRI.  HPI: Kathleen [name] is a 72 y.o. female with history of ESRD on hemodialysis Monday Wednesday Friday, diabetes mellitus type 2 on Levemir, hypertension on Coreg, anemia, hyperlipidemia was referred to the ER after patient's MRI was showing stroke.  Patient has been having headache mostly in the occipital area with at times dizziness with blurred vision over the last 2 months.  Patient's primary care physician ordered MRI of the brain which showed 2 small lacunar infarct involving the left corona radiata.  Patient was referred to the ER.  Patient otherwise denies any weakness of upper or lower extremity or any difficulty swallowing or speaking.  ED Course: In the ER patient is found to be nonfocal.  On-call neurologist was consulted and patient admitted for further management of early subacute stroke.  Review of Systems: As per HPI, rest all negative.   Past Medical History:  Diagnosis Date  . Anemia    a. 01/2014: suspected of chronic disease, negative FOBT.  [name]    knees  . Asthma    many years ago  . CAD (coronary artery disease)    a. Cath 01/2014: mild in LAD, mild-mod LCx, mod-severe RCA - for med rx.  . Diabetes mellitus without complication ([place])   . ESRD (end stage renal disease) ([place])    [place]  . History of echocardiogram    Echo (10/15):  Mod LVH, EF 50-55%, Gr 1 DD, MAC, mild MR, mild TR, PASP 35 mmHg  . Hx of transfusion   . Hyperlipidemia   . Hypertension   . Meningioma ([place])    a. Incidental dx 01/2014.  . Obesity   . Systolic CHF ([place])    a. 08/9415: dx with mixed ICM/NICM (out of proportion to CAD) EF 25-30% by echo.     Past Surgical History:  Procedure Laterality Date  . ABDOMINAL HYSTERECTOMY    . AV FISTULA PLACEMENT Left 02/10/2017   Procedure:  ARTERIOVENOUS (AV) FISTULA CREATION-LEFT FOREARM;  Surgeon: Elam Dutch, MD;  Location: [place];  Service: Vascular;  Laterality: Left;  . BREAST SURGERY Right    Lumpectomy  . COLONOSCOPY    . FISTULA SUPERFICIALIZATION Left 08/09/2017   Procedure: FISTULA SUPERFICIALIZATION LEFT ARM ARTERIOVENOUS FISTULA;  Surgeon: Conrad Pringle, MD;  Location: [place];  Service: Vascular;  Laterality: Left;  . INSERTION OF DIALYSIS CATHETER Right 02/10/2017   Procedure: INSERTION OF DIALYSIS CATHETER;  Surgeon: Elam Dutch, MD;  Location: [place];  Service: Vascular;  Laterality: Right;  . LEFT HEART CATHETERIZATION WITH CORONARY ANGIOGRAM N/A 01/27/2014   Procedure: LEFT HEART CATHETERIZATION WITH CORONARY ANGIOGRAM;  Surgeon: Jettie Booze, MD;  Location: [facility] CATH LAB;  Service: Cardiovascular;  Laterality: N/A;  . TONSILLECTOMY       reports that she has quit smoking. Her smoking use included cigarettes. She has a 7.50 pack-year smoking history. She has never used smokeless tobacco. She reports that she does not drink alcohol or use drugs.  Allergies  Allergen Reactions  . Baclofen     Severe delirium when given to pateint in Dec 2018.     Family History  Problem Relation Age of Onset  . Diabetic kidney disease Mother   . Hypertension Mother   . Heart failure Mother   . Heart attack Mother   .  Hyperlipidemia Mother   . Hypertension Father     Prior to Admission medications   Medication Sig Start Date End Date Taking? Authorizing Provider  aspirin 81 MG EC tablet Take 1 tablet (81 mg total) by mouth daily. 12/25/15  Yes Cherene Altes, MD  atorvastatin (LIPITOR) 40 MG tablet Take 1 tablet (40 mg total) by mouth daily at 6 PM. 07/19/17  Yes Alekh, Kshitiz, MD  brimonidine-timolol (COMBIGAN) 0.2-0.5 % ophthalmic solution Place 1 drop into the right eye 2 (two) times daily as needed (for glaucoma).    Yes [provider]  calcium acetate (PHOSLO) 667 MG capsule Take 2,001 mg by  mouth 3 (three) times daily with meals. Take 667 mg with snack   Yes [provider]  carvedilol (COREG) 25 MG tablet Take 25 mg by mouth 2 (two) times daily. 03/28/17  Yes [provider]  hydrALAZINE (APRESOLINE) 25 MG tablet Take 1 tablet (25 mg total) by mouth every 8 (eight) hours as needed (SBP > 170  or DBP > 110). Patient taking differently: Take 50 mg by mouth every 8 (eight) hours as needed (SBP > 170  or DBP > 110).  02/20/17  Yes Mikhail, [place], DO  HYDROcodone-acetaminophen (NORCO/VICODIN) 5-325 MG tablet Take 1 tablet by mouth every 6 (six) hours as needed. 08/09/17  Yes Rhyne, Hulen Shouts, PA-C  ibuprofen (ADVIL,MOTRIN) 200 MG tablet Take 400 mg by mouth every 6 (six) hours as needed for mild pain.   Yes [provider]  insulin detemir (LEVEMIR) 100 UNIT/ML injection Inject 0.15 mLs (15 Units total) into the skin at bedtime. Patient taking differently: Inject 15 Units into the skin at bedtime.  02/20/17  Yes Mikhail, [place], DO  multivitamin (RENA-VIT) TABS tablet Take 1 tablet by mouth at bedtime. 02/20/17  Yes Mikhail, Velta Addison, DO  polyethylene glycol (MIRALAX / GLYCOLAX) packet Take 17 g by mouth daily. Patient taking differently: Take 17 g by mouth daily as needed for mild constipation.  02/20/17  Yes Mikhail, Velta Addison, DO  calcitRIOL (ROCALTROL) 0.5 MCG capsule Take 1 capsule (0.5 mcg total) by mouth Every Tuesday,Thursday,and Saturday with dialysis. Patient not taking: Reported on 11/08/2017 02/21/17   Cristal Ford, DO  gabapentin (NEURONTIN) 100 MG capsule Take 100 mg by mouth at bedtime. 10/18/17   [provider]    Physical Exam: Vitals:   11/08/17 1607 11/08/17 1736 11/08/17 1827 11/08/17 2001  BP: (!) 156/49 (!) 126/50 (!) 136/46 (!) 159/61  Pulse: 78 78 74 76  Resp: _0 Temp: 98.3 F (36.8 C) 98.7 F (37.1 C) 97.9 F (36.6 C)   TempSrc: Oral Oral Oral   SpO2: 98% 97% 99% 98%  Weight:      Height:           Constitutional: Moderately built and nourished. Vitals:   11/08/17 1607 11/08/17 1736 11/08/17 1827 11/08/17 2001  BP: (!) 156/49 (!) 126/50 (!) 136/46 (!) 159/61  Pulse: 78 78 74 76  Resp: _1 Temp: 98.3 F (36.8 C) 98.7 F (37.1 C) 97.9 F (36.6 C)   TempSrc: Oral Oral Oral   SpO2: 98% 97% 99% 98%  Weight:      Height:       Eyes: Anicteric no pallor. ENMT: No discharge from the ears eyes nose or mouth. Neck: No mass felt.  No neck rigidity.  No JVD appreciated. Respiratory: No rhonchi or crepitations. Cardiovascular: S1-S2 heard no murmurs appreciated. Abdomen: Soft nontender bowel  sounds present. Musculoskeletal: No edema.  No joint effusion. Skin: No rash.  Skin appears warm. Neurologic: Alert awake oriented to time place and person.  Moves all extremities 5 x 5.  No facial asymmetry tongue is midline.[name] Psychiatric: Appears normal.   Labs on Admission: I have personally reviewed following labs and imaging studies  CBC: Recent Labs  Lab 11/08/17 1648 11/08/17 1659  WBC  --  4.7  NEUTROABS  --  2.4  HGB 10.5* 10.0*  HCT 31.0* 31.9*  MCV  --  100.9*  PLT  --  016   Basic Metabolic Panel: Recent Labs  Lab 11/08/17 1648 11/08/17 1659  NA 137 134*  K 4.3 4.2  CL 96* 95*  CO2  --  27  GLUCOSE 361* 358*  BUN 35* 34*  CREATININE 4.00* 4.02*  CALCIUM  --  8.8*   GFR: Estimated Creatinine Clearance: 12.5 mL/min (A) (by C-G formula based on SCr of 4.02 mg/dL (H)). Liver Function Tests: Recent Labs  Lab 11/08/17 1659  AST 19  ALT 8*  ALKPHOS 82  BILITOT 0.7  PROT 6.6  ALBUMIN 3.6   No results for input(s): LIPASE, AMYLASE in the last 168 hours. No results for input(s): AMMONIA in the last 168 hours. Coagulation Profile: Recent Labs  Lab 11/08/17 1659  INR 1.11   Cardiac Enzymes: No results for input(s): CKTOTAL, CKMB, CKMBINDEX, TROPONINI in the last 168 hours. BNP (last 3 results) No results for input(s): PROBNP in the last  8760 hours. HbA1C: No results for input(s): HGBA1C in the last 72 hours. CBG: Recent Labs  Lab 11/08/17 1858  GLUCAP 287*   Lipid Profile: No results for input(s): CHOL, HDL, LDLCALC, TRIG, CHOLHDL, LDLDIRECT in the last 72 hours. Thyroid Function Tests: No results for input(s): TSH, T4TOTAL, FREET4, T3FREE, THYROIDAB in the last 72 hours. Anemia Panel: No results for input(s): VITAMINB12, FOLATE, FERRITIN, TIBC, IRON, RETICCTPCT in the last 72 hours. Urine analysis:    Component Value Date/Time   COLORURINE YELLOW 07/13/2017 2335   APPEARANCEUR CLEAR 07/13/2017 2335   LABSPEC 1.009 07/13/2017 2335   PHURINE 6.0 07/13/2017 2335   GLUCOSEU NEGATIVE 07/13/2017 2335   HGBUR NEGATIVE 07/13/2017 2335   BILIRUBINUR NEGATIVE 07/13/2017 2335   KETONESUR NEGATIVE 07/13/2017 2335   PROTEINUR 100 (A) 07/13/2017 2335   UROBILINOGEN 0.2 06/11/2014 0639   NITRITE NEGATIVE 07/13/2017 2335   LEUKOCYTESUR NEGATIVE 07/13/2017 2335   Sepsis Labs: _0 (procalcitonin:4,lacticidven:4) )No results found for this or any previous visit (from the past 240 hour(s)).   Radiological Exams on Admission: No results found.  EKG: Independently reviewed.  Normal sinus rhythm.  Assessment/Plan Principal Problem:   Acute CVA (cerebrovascular accident) ([place]) Active Problems:   ESRD on dialysis ([place])   CAD (coronary artery disease)   Meningioma (H[facility])   Chronic combined systolic and diastolic heart failure (HCC)   Anemia in chronic kidney disease   Peripheral arterial disease ([place])   DM (diabetes mellitus), type 2 with renal complications ([place])    1. Acute CVA -appreciate neurology consult.  CT angiogram of the head and neck 2D echo has been ordered.  Patient is on aspirin and Lipitor.  Physical therapy consult.  Check hemoglobin A1c and lipid panel. 2. ESRD on hemodialysis on Monday Wednesday Friday.  Consult nephrology for dialysis.  Patient does not look to be in fluid  overload. 3. Hypertension on Coreg.  Allow for permissive hypertension.  PRN IV hydralazine for systolic blood pressure more than 220. 4. Chronic combined  systolic and diastolic CHF.  Fluid management during dialysis. 5. Meningioma appears to be chronic. 6. Anemia secondary to renal disease.  Follow CBC. 7. Diabetes mellitus type 2 uncontrolled with hyperglycemia.  Patient is on Levemir 15 units.  Check hemoglobin A1c.  Closely follow CBGs with sliding scale coverage.   DVT prophylaxis: Heparin. Code Status: Full code. Family Communication: Patient's son. Disposition Plan: Home. Consults called: Neurology. Admission status: Observation.   Rise Patience MD [facility] Pager [phone].  If 7PM-7AM, please contact night-coverage [email or web address] Password [facility]  11/08/2017, 8:58 PM

## 2017-11-08 NOTE — Consult Note (Addendum)
Neurology Consultation  Reason for Consult: stroke on MRI done at [facility] Referring Physician: Dr. Regenia Skeeter  CC: Headache, MRI showing strokes  History is obtained from: Patient, chart, patient's son at bedside  HPI: Kathleen [name] is a 72 y.o. female who has a past medical history significant for coronary artery disease, diabetes, end-stage renal disease on dialysis Tuesday/Thursday/Saturday, hyperlipidemia, hypertension, systolic CHF, documented meningioma incidental finding diagnosed in 01/2014, who presented to the emergency room after she got an MRI of her head at [facility] (report in [place]) that revealed 2 subcentimeter lacunar infarctions in the left coronary radiata probably early subacute.  She was sent to the emergency room for further evaluation. Patient says that she has been having some right sided posterior head discomfort/headache for the past 2 months.  She got an MRI done for this reason at the outside hospital the day.  The MRI showed the lacunar infarcts as above. She denies any tingling or numbness.  She denies any focal weakness.  She reports off-and-on cramping in her left leg and left-sided numbness.  Denies any right-sided symptoms. Denies any visual symptoms.  Denies blurred vision.  Has no history of migraines.  LKW: Unclear-gives a two-month history of last known normal tpa given?: no, outside the window Premorbid modified Rankin scale (mRS): 2 NIH stroke scale-1 for sensory on the ipsilateral side of the strokes.  ROS: ROS was performed and is negative except as noted in the HPI.   Past Medical History:  Diagnosis Date  . Anemia    a. 01/2014: suspected of chronic disease, negative FOBT.  [name]    knees  . Asthma    many years ago  . CAD (coronary artery disease)    a. Cath 01/2014: mild in LAD, mild-mod LCx, mod-severe RCA - for med rx.  . Diabetes mellitus without complication ([place])   . ESRD (end stage renal disease) ([place])    [place]  . History of echocardiogram    Echo (10/15):  Mod LVH, EF 50-55%, Gr 1 DD, MAC, mild MR, mild TR, PASP 35 mmHg  . Hx of transfusion   . Hyperlipidemia   . Hypertension   . Meningioma ([place])    a. Incidental dx 01/2014.  . Obesity   . Systolic CHF ([place])    a. 04/5092: dx with mixed ICM/NICM (out of proportion to CAD) EF 25-30% by echo.     Family History  Problem Relation Age of Onset  . Diabetic kidney disease Mother   . Hypertension Mother   . Heart failure Mother   . Heart attack Mother   . Hyperlipidemia Mother   . Hypertension Father     Social History:   reports that she has quit smoking. Her smoking use included cigarettes. She has a 7.50 pack-year smoking history. She has never used smokeless tobacco. She reports that she does not drink alcohol or use drugs.  Medications  Current Facility-Administered Medications:  .  aspirin chewable tablet 324 mg, 324 mg, Oral, Once, Sherwood Gambler, MD  Current Outpatient Medications:  .  aspirin 81 MG EC tablet, Take 1 tablet (81 mg total) by mouth daily., Disp: 30 tablet, Rfl: 3 .  atorvastatin (LIPITOR) 40 MG tablet, Take 1 tablet (40 mg total) by mouth daily at 6 PM., Disp: 30 tablet, Rfl: 0 .  brimonidine-timolol (COMBIGAN) 0.2-0.5 % ophthalmic solution, Place 1 drop into the right eye 2 (two) times daily as needed (for glaucoma). , Disp: , Rfl:  .  calcitRIOL (ROCALTROL) 0.5 MCG capsule, Take 1 capsule (0.5 mcg total) by mouth Every Tuesday,Thursday,and Saturday with dialysis., Disp: , Rfl:  .  calcium acetate (PHOSLO) 667 MG capsule, Take 667 mg by mouth 3 (three) times daily with meals., Disp: , Rfl:  .  carvedilol (COREG) 25 MG tablet, Take 25 mg by mouth 2 (two) times daily., Disp: , Rfl: 3 .  hydrALAZINE (APRESOLINE) 25 MG tablet, Take 1 tablet (25 mg total) by mouth every 8 (eight) hours as needed (SBP > 170  or DBP > 110)., Disp: 90 tablet, Rfl: 0 .  HYDROcodone-acetaminophen (NORCO/VICODIN) 5-325 MG tablet, Take  1 tablet by mouth every 6 (six) hours as needed., Disp: 8 tablet, Rfl: 0 .  ibuprofen (ADVIL,MOTRIN) 200 MG tablet, Take 400 mg by mouth every 6 (six) hours as needed for mild pain., Disp: , Rfl:  .  insulin detemir (LEVEMIR) 100 UNIT/ML injection, Inject 0.15 mLs (15 Units total) into the skin at bedtime. (Patient taking differently: Inject 15 Units into the skin at bedtime. ), Disp: 10 mL, Rfl: 11 .  multivitamin (RENA-VIT) TABS tablet, Take 1 tablet by mouth at bedtime., Disp: 30 tablet, Rfl: 0 .  polyethylene glycol (MIRALAX / GLYCOLAX) packet, Take 17 g by mouth daily. (Patient taking differently: Take 17 g by mouth daily as needed for mild constipation. ), Disp: 14 each, Rfl: 0   Exam: Current vital signs: BP (!) 136/46 (BP Location: Right Arm)   Pulse 74   Temp 97.9 F (36.6 C) (Oral)   Resp 14   Ht _0  (1.6 m)   Wt 80.7 kg (178 lb)   LMP  (LMP Unknown)   SpO2 99%   BMI 31.53 kg/m  Vital signs in last 24 hours: Temp:  [97.9 F (36.6 C)-98.7 F (37.1 C)] 97.9 F (36.6 C) (04/03 1827) Pulse Rate:  [74-78] 74 (04/03 1827) Resp:  [14-18] 14 (04/03 1827) BP: (126-156)/(46-50) 136/46 (04/03 1827) SpO2:  [97 %-99 %] 99 % (04/03 1827) Weight:  [80.7 kg (178 lb)] 80.7 kg (178 lb) (04/03 1606)  GENERAL: Awake, alert in NAD HEENT: - Normocephalic and atraumatic, dry mm, no LN++, no Thyromegally LUNGS - Clear to auscultation bilaterally with no wheezes CV - S1S2 RRR, no m/r/g, equal pulses bilaterally. ABDOMEN - Soft, nontender, nondistended with normoactive BS Ext: warm, well perfused, intact peripheral pulses, no edema.  Left arm has AV fistula in the bicep.  NEURO:  Mental Status: AA&Ox3  Language: speech is clear.  Naming, repetition, fluency, and comprehension intact. Cranial Nerves: PERRL. EOMI, visual fields full, no facial asymmetry, facial sensation intact, hearing intact, tongue/uvula/soft palate midline, normal sternocleidomastoid and trapezius muscle strength. No  evidence of tongue atrophy or fibrillations Motor: 5/5 both upper extremities with no drift.  5/5 right lower extremity with no drift.  4+/5 left lower extremity- slightly limited by pain.  No vertical drift. Tone: is normal and bulk is normal Sensation-decreased to touch left hemibody.  Normal sensation on the right.  No extinction Coordination: FTN intact bilaterally Gait- deferred NIHSS-1 for sensory   Labs I have reviewed labs in epic and the results pertinent to this consultation are:  CBC    Component Value Date/Time   WBC 4.7 11/08/2017 1659   RBC 3.16 (L) 11/08/2017 1659   HGB 10.0 (L) 11/08/2017 1659   HCT 31.9 (L) 11/08/2017 1659   PLT 181 11/08/2017 1659   MCV 100.9 (H) 11/08/2017 1659   MCH 31.6 11/08/2017 1659   MCHC 31.3  11/08/2017 1659   RDW 15.7 (H) 11/08/2017 1659   LYMPHSABS 1.5 11/08/2017 1659   MONOABS 0.3 11/08/2017 1659   EOSABS 0.5 11/08/2017 1659   BASOSABS 0.0 11/08/2017 1659    CMP     Component Value Date/Time   NA 134 (L) 11/08/2017 1659   K 4.2 11/08/2017 1659   CL 95 (L) 11/08/2017 1659   CO2 27 11/08/2017 1659   GLUCOSE 358 (H) 11/08/2017 1659   BUN 34 (H) 11/08/2017 1659   CREATININE 4.02 (H) 11/08/2017 1659   CALCIUM 8.8 (L) 11/08/2017 1659   CALCIUM 9.0 02/17/2017 0730   PROT 6.6 11/08/2017 1659   ALBUMIN 3.6 11/08/2017 1659   AST 19 11/08/2017 1659   ALT 8 (L) 11/08/2017 1659   ALKPHOS 82 11/08/2017 1659   BILITOT 0.7 11/08/2017 1659   GFRNONAA 10 (L) 11/08/2017 1659   GFRAA 12 (L) 11/08/2017 1659    Imaging MRI examination of the brain report reviewed from [facility] in [place]. IMPRESSION:  1. Two subcentimeter lacunar infarctions in the left corona radiata. These are probably early subacute in age. 2. Extra-axial dural-based mass along the inner table of the superomedial left parietal bone (approximately 20 mm oblique AP x 24 mm oblique transverse x 9 mm thickness). Medially, it approaches the superior sagittal sinus,  but there is no evidence of  superior sagittal sinus invasion. No significant mass effect on the brain. Statistically, this is most likely a meningioma.   Assessment:  72 year old woman past history as above presented to the ER after MRI of the brain done for persistent head discomfort/headache revealed 2 lacunar infarcts in the left corona radiata. Etiology based on review of the report seems to be small vessel. No images of the MRI to review.  Report reviewed in care everywhere. Given her risk factors, likely small vessel strokes. Clinical exam consistent with left-sided sensory loss which does not correlate with the imaging findings.  Impression: Acute ischemic stroke-lacunar infarcts in the left coronary radiata likely small vessel etiology. HTN HLD Incidental meningioma ESRD  Recommendations: -Admit to hospitalist -Telemetry monitoring -Allow for permissive hypertension for the first 24-48h - only treat PRN if SBP >220 mmHg. Blood pressures can be gradually normalized to SBP<140 upon discharge. -MRI brain without contrast-was done at an outside facility.  Images not available.  Asked patient's family to request images.  If images are not available, consider repeating an MRI.  Patient claustrophobic and reluctant to repeat MRI. -CT Angiogram of Head and neck today.  Has dialysis scheduled for tomorrow. -Echocardiogram -HgbA1c, fasting lipid panel -Frequent neuro checks -Prophylactic therapy-Antiplatelet med: Aspirin - dose 369m PO or 3060mPR -Atorvastatin 80 mg PO daily -Risk factor modification -PT consult, OT consult, Speech consult -Less likely that this stroke as reported on the MRI is a result of atrial fibrillation but if Afib found on telemetry, will need anticoagulation. Decision pending cardiac monitoring results, clinical course, imaging and stroke team rounding tomorrow.  Please page stroke NP/PA/MD (listed on AMION)  from 8am-4 pm as this patient will be followed by  the stroke team at this point.  -- AsAmie PortlandMD [facility] Pager: [phone] 7pm to 7am, please call on call as listed on AMION.

## 2017-11-08 NOTE — ED Provider Notes (Signed)
[facility] EMERGENCY DEPARTMENT Provider Note   CSN: [id number] Arrival date & time: 11/08/17  1601     History   Chief Complaint Chief Complaint  Patient presents with  . Cerebrovascular Accident    HPI Kathleen [name] is a 72 y.o. female.  HPI  72 year old female with a history of hypertension, hyperlipidemia, diabetes, end-stage renal disease on dialysis presents with a stroke on MRI.  She states for the last 2 months has been having right occipital head pressure that goes into her neck as well as on and off numbness, both hands but worse on the left.  An MRI today was called and told she had stroke.  She denies a current headache it is more positional.  Currently feels a little bit of numbness in her left arm.  No leg symptoms.  Past Medical History:  Diagnosis Date  . Anemia    a. 01/2014: suspected of chronic disease, negative FOBT.  [name]    knees  . Asthma    many years ago  . CAD (coronary artery disease)    a. Cath 01/2014: mild in LAD, mild-mod LCx, mod-severe RCA - for med rx.  . Diabetes mellitus without complication ([place])   . ESRD (end stage renal disease) ([place])    [place]  . History of echocardiogram    Echo (10/15):  Mod LVH, EF 50-55%, Gr 1 DD, MAC, mild MR, mild TR, PASP 35 mmHg  . Hx of transfusion   . Hyperlipidemia   . Hypertension   . Meningioma ([place])    a. Incidental dx 01/2014.  . Obesity   . Systolic CHF ([place])    a. 01/2693: dx with mixed ICM/NICM (out of proportion to CAD) EF 25-30% by echo.     Patient Active Problem List   Diagnosis Date Noted  . Altered mental status 07/14/2017  . Knee pain   . Acute lower UTI   . Peripheral arterial disease ([place]) 02/23/2016  . Insulin dependent diabetes mellitus ([place]) 12/18/2015  . Anemia in chronic kidney disease 12/18/2015  . Dyspnea   . Goals of care, counseling/discussion   . SOB (shortness of breath)   . Encounter for palliative care   . Pulmonary  vascular congestion   . Pleural effusion, left   . HLD (hyperlipidemia)   . Noncompliance with medication regimen   . Disorientation   . Somnolence   . Acute respiratory failure with hypercapnia ([place])   . Respiratory acidosis   . Hypernatremia   . Systolic and diastolic CHF, acute ([place])   . CHF, acute on chronic ([place]) 08/05/2015  . Elevated troponin 08/05/2015  . CHF (congestive heart failure) ([place]) 08/05/2015  . Acute on chronic heart failure ([place]) 08/05/2015  . Acute on chronic combined systolic and diastolic congestive heart failure ([place])   . Left carotid bruit 11/20/2014  . Syncope and collapse 06/10/2014  . Syncope 06/10/2014  . Essential hypertension   . Unspecified vitamin D deficiency   . Cellulitis and abscess of right lower extremitiy 02/28/2014  . Chronic combined systolic and diastolic heart failure ([place]) 02/12/2014  . CAD (coronary artery disease)   . Obesity   . Meningioma ([place])   . Anemia   . ESRD on dialysis ([place]) 01/24/2014  . Hyperlipidemia 01/22/2014  . BPPV (benign paroxysmal positional vertigo) 01/22/2014  . Hypertensive urgency 01/21/2014  . Tobacco abuse 01/21/2014  . Diabetes mellitus due to abnormal insulin ([place]) 01/21/2014    Past  Surgical History:  Procedure Laterality Date  . ABDOMINAL HYSTERECTOMY    . AV FISTULA PLACEMENT Left 02/10/2017   Procedure: ARTERIOVENOUS (AV) FISTULA CREATION-LEFT FOREARM;  Surgeon: Elam Dutch, MD;  Location: [facility];  Service: Vascular;  Laterality: Left;  . BREAST SURGERY Right    Lumpectomy  . COLONOSCOPY    . FISTULA SUPERFICIALIZATION Left 08/09/2017   Procedure: FISTULA SUPERFICIALIZATION LEFT ARM ARTERIOVENOUS FISTULA;  Surgeon: Conrad Bandon, MD;  Location: [facility];  Service: Vascular;  Laterality: Left;  . INSERTION OF DIALYSIS CATHETER Right 02/10/2017   Procedure: INSERTION OF DIALYSIS CATHETER;  Surgeon: Elam Dutch, MD;  Location: [facility];  Service: Vascular;  Laterality: Right;  . LEFT HEART  CATHETERIZATION WITH CORONARY ANGIOGRAM N/A 01/27/2014   Procedure: LEFT HEART CATHETERIZATION WITH CORONARY ANGIOGRAM;  Surgeon: Jettie Booze, MD;  Location: [facility] C[facility] LAB;  Service: Cardiovascular;  Laterality: N/A;  . TONSILLECTOMY       OB History   None      Home Medications    Prior to Admission medications   Medication Sig Start Date End Date Taking? Authorizing Provider  aspirin 81 MG EC tablet Take 1 tablet (81 mg total) by mouth daily. 12/25/15  Yes Cherene Altes, MD  atorvastatin (LIPITOR) 40 MG tablet Take 1 tablet (40 mg total) by mouth daily at 6 PM. 07/19/17  Yes Alekh, Kshitiz, MD  brimonidine-timolol (COMBIGAN) 0.2-0.5 % ophthalmic solution Place 1 drop into the right eye 2 (two) times daily as needed (for glaucoma).    Yes [provider]  calcium acetate (PHOSLO) 667 MG capsule Take 2,001 mg by mouth 3 (three) times daily with meals. Take 667 mg with snack   Yes [provider]  carvedilol (COREG) 25 MG tablet Take 25 mg by mouth 2 (two) times daily. 03/28/17  Yes [provider]  hydrALAZINE (APRESOLINE) 25 MG tablet Take 1 tablet (25 mg total) by mouth every 8 (eight) hours as needed (SBP > 170  or DBP > 110). Patient taking differently: Take 50 mg by mouth every 8 (eight) hours as needed (SBP > 170  or DBP > 110).  02/20/17  Yes Mikhail, [place], DO  HYDROcodone-acetaminophen (NORCO/VICODIN) 5-325 MG tablet Take 1 tablet by mouth every 6 (six) hours as needed. 08/09/17  Yes Rhyne, Hulen Shouts, PA-C  ibuprofen (ADVIL,MOTRIN) 200 MG tablet Take 400 mg by mouth every 6 (six) hours as needed for mild pain.   Yes [provider]  insulin detemir (LEVEMIR) 100 UNIT/ML injection Inject 0.15 mLs (15 Units total) into the skin at bedtime. Patient taking differently: Inject 15 Units into the skin at bedtime.  02/20/17  Yes Mikhail, [place], DO  multivitamin (RENA-VIT) TABS tablet Take 1 tablet by mouth at bedtime. 02/20/17  Yes Mikhail,  Velta Addison, DO  polyethylene glycol (MIRALAX / GLYCOLAX) packet Take 17 g by mouth daily. Patient taking differently: Take 17 g by mouth daily as needed for mild constipation.  02/20/17  Yes Mikhail, Velta Addison, DO  calcitRIOL (ROCALTROL) 0.5 MCG capsule Take 1 capsule (0.5 mcg total) by mouth Every Tuesday,Thursday,and Saturday with dialysis. Patient not taking: Reported on 11/08/2017 02/21/17   Cristal Ford, DO  gabapentin (NEURONTIN) 100 MG capsule Take 100 mg by mouth at bedtime. 10/18/17   [provider]    Family History Family History  Problem Relation Age of Onset  . Diabetic kidney disease Mother   . Hypertension Mother   . Heart failure Mother   . Heart  attack Mother   . Hyperlipidemia Mother   . Hypertension Father     Social History Social History   Tobacco Use  . Smoking status: Former Smoker    Packs/day: 0.25    Years: 30.00    Pack years: 7.50    Types: Cigarettes  . Smokeless tobacco: Never Used  . Tobacco comment: quit late 1990's  Substance Use Topics  . Alcohol use: No  . Drug use: No     Allergies   Baclofen   Review of Systems Review of Systems  Musculoskeletal: Positive for neck pain.  Neurological: Positive for weakness, numbness and headaches.  All other systems reviewed and are negative.    Physical Exam Updated Vital Signs BP (!) 159/61 (BP Location: Right Arm)   Pulse 76   Temp 97.9 F (36.6 C) (Oral)   Resp 18   Ht _0  (1.6 m)   Wt 80.7 kg (178 lb)   LMP  (LMP Unknown)   SpO2 98%   BMI 31.53 kg/m   Physical Exam  Constitutional: She is oriented to person, place, and time. She appears well-developed and well-nourished. No distress.  HENT:  Head: Normocephalic and atraumatic.  Right Ear: External ear normal.  Left Ear: External ear normal.  Nose: Nose normal.  Eyes: Pupils are equal, round, and reactive to light. EOM are normal. Right eye exhibits no discharge. Left eye exhibits no discharge.  Cardiovascular: Normal  rate and regular rhythm.  Murmur heard. Pulmonary/Chest: Effort normal and breath sounds normal.  Abdominal: Soft. There is no tenderness.  Neurological: She is alert and oriented to person, place, and time.  CN 3-12 grossly intact. 5/5 strength in all 4 extremities. Slightly decreased sensation to thenar left hand/forearm. Normal finger to nose.   Skin: Skin is warm and dry. She is not diaphoretic.  Nursing note and vitals reviewed.    ED Treatments / Results  Labs (all labs ordered are listed, but only abnormal results are displayed) Labs Reviewed  CBC - Abnormal; Notable for the following components:      Result Value   RBC 3.16 (*)    Hemoglobin 10.0 (*)    HCT 31.9 (*)    MCV 100.9 (*)    RDW 15.7 (*)    All other components within normal limits  COMPREHENSIVE METABOLIC PANEL - Abnormal; Notable for the following components:   Sodium 134 (*)    Chloride 95 (*)    Glucose, Bld 358 (*)    BUN 34 (*)    Creatinine, Ser 4.02 (*)    Calcium 8.8 (*)    ALT 8 (*)    GFR calc non Af Amer 10 (*)    GFR calc Af Amer 12 (*)    All other components within normal limits  CBG MONITORING, ED - Abnormal; Notable for the following components:   Glucose-Capillary 287 (*)    All other components within normal limits  I-STAT CHEM 8, ED - Abnormal; Notable for the following components:   Chloride 96 (*)    BUN 35 (*)    Creatinine, Ser 4.00 (*)    Glucose, Bld 361 (*)    Calcium, Ion 1.06 (*)    Hemoglobin 10.5 (*)    HCT 31.0 (*)    All other components within normal limits  PROTIME-INR  APTT  DIFFERENTIAL  HEMOGLOBIN A1C  LIPID PANEL  I-STAT TROPONIN, ED    EKG EKG Interpretation  Date/Time:  Wednesday November 08 2017 16:19:02 EDT Ventricular  Rate:  76 PR Interval:  162 QRS Duration: 90 QT Interval:  450 QTC Calculation: 506 R Axis:   43 Text Interpretation:  Normal sinus rhythm Cannot rule out Anterior infarct , age undetermined Abnormal ECG no significant change  since Dec 2018 Confirmed by Sherwood Gambler [phone]) on 11/08/2017 7:02:51 PM   Radiology No results found.  MR Brain wo Contrast IMPRESSION:   1. Two subcentimeter lacunar infarctions in the left corona radiata. These are probably early subacute in age.  2. Extra-axial dural-based mass along the inner table of the superomedial left parietal bone (approximately 20 mm oblique AP x 24 mm oblique transverse x 9 mm thickness). Medially, it approaches the superior sagittal sinus, but there is no evidence of  superior sagittal sinus invasion. No significant mass effect on the brain. Statistically, this is most likely a meningioma.  Procedures Procedures (including critical care time)  Medications Ordered in ED Medications   stroke: mapping our early stages of recovery book (has no administration in time range)  aspirin chewable tablet 324 mg (324 mg Oral Given 11/08/17 2002)     Initial Impression / Assessment and Plan / ED Course  I have reviewed the triage vital signs and the nursing notes.  Pertinent labs & imaging results that were available during my care of the patient were reviewed by me and considered in my medical decision making (see chart for details).     Patient's exam and presentation does not correlate with her MRI findings.  However given the concern for stroke and her multiple risk factors, neurology consulted.  Discussed with Dr. Rory Percy, who recommends full dose aspirin for now and workup by hospitalist.  He will consult.  Final Clinical Impressions(s) / ED Diagnoses   Final diagnoses:  Lacunar stroke [facility])    ED Discharge Orders    None       Sherwood Gambler, MD 11/08/17 2015

## 2017-11-08 NOTE — ED Notes (Signed)
Pt's son states that she had an MRI done today at [facility] and that it showed she has had a stroke.

## 2017-11-09 ENCOUNTER — Observation Stay (HOSPITAL_BASED_OUTPATIENT_CLINIC_OR_DEPARTMENT_OTHER): Payer: Medicare Other

## 2017-11-09 ENCOUNTER — Other Ambulatory Visit: Payer: Self-pay

## 2017-11-09 DIAGNOSIS — E1121 Type 2 diabetes mellitus with diabetic nephropathy: Secondary | ICD-10-CM | POA: Diagnosis not present

## 2017-11-09 DIAGNOSIS — I639 Cerebral infarction, unspecified: Secondary | ICD-10-CM | POA: Diagnosis not present

## 2017-11-09 DIAGNOSIS — E1122 Type 2 diabetes mellitus with diabetic chronic kidney disease: Secondary | ICD-10-CM | POA: Diagnosis not present

## 2017-11-09 DIAGNOSIS — D631 Anemia in chronic kidney disease: Secondary | ICD-10-CM | POA: Diagnosis present

## 2017-11-09 DIAGNOSIS — M4802 Spinal stenosis, cervical region: Secondary | ICD-10-CM | POA: Diagnosis not present

## 2017-11-09 DIAGNOSIS — E669 Obesity, unspecified: Secondary | ICD-10-CM | POA: Diagnosis present

## 2017-11-09 DIAGNOSIS — Z992 Dependence on renal dialysis: Secondary | ICD-10-CM | POA: Diagnosis not present

## 2017-11-09 DIAGNOSIS — E1165 Type 2 diabetes mellitus with hyperglycemia: Secondary | ICD-10-CM | POA: Diagnosis present

## 2017-11-09 DIAGNOSIS — I5042 Chronic combined systolic (congestive) and diastolic (congestive) heart failure: Secondary | ICD-10-CM

## 2017-11-09 DIAGNOSIS — E8889 Other specified metabolic disorders: Secondary | ICD-10-CM | POA: Diagnosis present

## 2017-11-09 DIAGNOSIS — D329 Benign neoplasm of meninges, unspecified: Secondary | ICD-10-CM | POA: Diagnosis present

## 2017-11-09 DIAGNOSIS — G959 Disease of spinal cord, unspecified: Secondary | ICD-10-CM | POA: Diagnosis not present

## 2017-11-09 DIAGNOSIS — N2581 Secondary hyperparathyroidism of renal origin: Secondary | ICD-10-CM | POA: Diagnosis not present

## 2017-11-09 DIAGNOSIS — R29700 NIHSS score 0: Secondary | ICD-10-CM | POA: Diagnosis not present

## 2017-11-09 DIAGNOSIS — J45909 Unspecified asthma, uncomplicated: Secondary | ICD-10-CM | POA: Diagnosis present

## 2017-11-09 DIAGNOSIS — I6789 Other cerebrovascular disease: Secondary | ICD-10-CM | POA: Diagnosis not present

## 2017-11-09 DIAGNOSIS — I2510 Atherosclerotic heart disease of native coronary artery without angina pectoris: Secondary | ICD-10-CM | POA: Diagnosis present

## 2017-11-09 DIAGNOSIS — I6381 Other cerebral infarction due to occlusion or stenosis of small artery: Secondary | ICD-10-CM | POA: Diagnosis present

## 2017-11-09 DIAGNOSIS — G9520 Unspecified cord compression: Secondary | ICD-10-CM | POA: Diagnosis not present

## 2017-11-09 DIAGNOSIS — N189 Chronic kidney disease, unspecified: Secondary | ICD-10-CM | POA: Diagnosis not present

## 2017-11-09 DIAGNOSIS — I132 Hypertensive heart and chronic kidney disease with heart failure and with stage 5 chronic kidney disease, or end stage renal disease: Secondary | ICD-10-CM | POA: Diagnosis present

## 2017-11-09 DIAGNOSIS — I429 Cardiomyopathy, unspecified: Secondary | ICD-10-CM | POA: Diagnosis present

## 2017-11-09 DIAGNOSIS — R29701 NIHSS score 1: Secondary | ICD-10-CM | POA: Diagnosis present

## 2017-11-09 DIAGNOSIS — I739 Peripheral vascular disease, unspecified: Secondary | ICD-10-CM | POA: Diagnosis not present

## 2017-11-09 DIAGNOSIS — H538 Other visual disturbances: Secondary | ICD-10-CM | POA: Diagnosis present

## 2017-11-09 DIAGNOSIS — N186 End stage renal disease: Secondary | ICD-10-CM

## 2017-11-09 DIAGNOSIS — I6339 Cerebral infarction due to thrombosis of other cerebral artery: Secondary | ICD-10-CM | POA: Diagnosis not present

## 2017-11-09 DIAGNOSIS — E785 Hyperlipidemia, unspecified: Secondary | ICD-10-CM | POA: Diagnosis present

## 2017-11-09 DIAGNOSIS — E1151 Type 2 diabetes mellitus with diabetic peripheral angiopathy without gangrene: Secondary | ICD-10-CM | POA: Diagnosis present

## 2017-11-09 DIAGNOSIS — F40240 Claustrophobia: Secondary | ICD-10-CM | POA: Diagnosis present

## 2017-11-09 DIAGNOSIS — I120 Hypertensive chronic kidney disease with stage 5 chronic kidney disease or end stage renal disease: Secondary | ICD-10-CM | POA: Diagnosis not present

## 2017-11-09 DIAGNOSIS — M170 Bilateral primary osteoarthritis of knee: Secondary | ICD-10-CM | POA: Diagnosis present

## 2017-11-09 HISTORY — DX: Cerebral infarction, unspecified: I63.9

## 2017-11-09 LAB — CBC
HCT: 31.3 % — ABNORMAL LOW (ref 36.0–46.0)
Hemoglobin: 9.8 g/dL — ABNORMAL LOW (ref 12.0–15.0)
MCH: 31.5 pg (ref 26.0–34.0)
MCHC: 31.3 g/dL (ref 30.0–36.0)
MCV: 100.6 fL — ABNORMAL HIGH (ref 78.0–100.0)
PLATELETS: 175 10*3/uL (ref 150–400)
RBC: 3.11 MIL/uL — AB (ref 3.87–5.11)
RDW: 15.8 % — ABNORMAL HIGH (ref 11.5–15.5)
WBC: 4.4 10*3/uL (ref 4.0–10.5)

## 2017-11-09 LAB — CREATININE, SERUM
CREATININE: 4.59 mg/dL — AB (ref 0.44–1.00)
GFR calc Af Amer: 10 mL/min — ABNORMAL LOW (ref >60)
GFR, EST NON AFRICAN AMERICAN: 9 mL/min — AB (ref >60)

## 2017-11-09 LAB — GLUCOSE, CAPILLARY
Glucose-Capillary: 85 mg/dL (ref 65–99)
Glucose-Capillary: 101 mg/dL — ABNORMAL HIGH (ref 65–99)
Glucose-Capillary: 133 mg/dL — ABNORMAL HIGH (ref 65–99)
Glucose-Capillary: 146 mg/dL — ABNORMAL HIGH (ref 65–99)

## 2017-11-09 LAB — LIPID PANEL
CHOL/HDL RATIO: 3.8 ratio
CHOLESTEROL: 234 mg/dL — AB (ref 0–200)
HDL: 62 mg/dL (ref >40)
LDL Cholesterol: 154 mg/dL — ABNORMAL HIGH (ref 0–99)
TRIGLYCERIDES: 88 mg/dL (ref <150)
VLDL: 18 mg/dL (ref 0–40)

## 2017-11-09 LAB — SEDIMENTATION RATE: Sed Rate: 53 mm/hr — ABNORMAL HIGH (ref 0–22)

## 2017-11-09 LAB — ECHOCARDIOGRAM COMPLETE
Height: 63 in
Weight: 2998.256 oz

## 2017-11-09 LAB — HEMOGLOBIN A1C
Hgb A1c MFr Bld: 7.5 % — ABNORMAL HIGH (ref 4.8–5.6)
MEAN PLASMA GLUCOSE: 168.55 mg/dL

## 2017-11-09 LAB — MRSA PCR SCREENING: MRSA by PCR: NEGATIVE

## 2017-11-09 LAB — CBG MONITORING, ED: GLUCOSE-CAPILLARY: 204 mg/dL — AB (ref 65–99)

## 2017-11-09 MED ORDER — DOXERCALCIFEROL 4 MCG/2ML IV SOLN
4.0000 ug | INTRAVENOUS | Status: DC
  Filled 2017-11-09: qty 2

## 2017-11-09 NOTE — Evaluation (Signed)
Physical Therapy Evaluation Patient Details Name: Kathleen [name] MRN: [id number] DOB: 1946-04-28 Today's Date: 11/09/2017   History of Present Illness  72 yo female with onset of HA and blurred vision for two months received an MRI showing 2 focal corona radiata strokes, sent to ER.  Imaging shows partial B carotid stenosis and atherosclerosis.  PMHx:  meningioma, DM, ESRD, HTN, CAD, CHF  Clinical Impression  Pt is up to walk with assistance and noted her ability to assist with gait, not safe enough alone and cannot stand and handle transitions in BR without help.  Her plan is to request rehab in SNF to increase her safety and control of balance and then transition to home with family support.  See acutely to work on balance and strength as able.    Follow Up Recommendations SNF    Equipment Recommendations  None recommended by PT    Recommendations for Other Services       Precautions / Restrictions Precautions Precautions: Fall(telemetry) Restrictions Weight Bearing Restrictions: No      Mobility  Bed Mobility Overal bed mobility: Needs Assistance Bed Mobility: Supine to Sit     Supine to sit: Min guard     General bed mobility comments: pt is very able to use bedrail and assist sitting up  Transfers Overall transfer level: Needs assistance Equipment used: Rolling walker (2 wheeled);1 person hand held assist Transfers: Sit to/from Stand Sit to Stand: Min assist;Mod assist(min higher and mod lower)         General transfer comment: pt is using bed to push for power up as well as in BR but needs help  Ambulation/Gait Ambulation/Gait assistance: Min assist Ambulation Distance (Feet): 40 Feet Assistive device: Rolling walker (2 wheeled);1 person hand held assist Gait Pattern/deviations: Step-to pattern;Step-through pattern;Decreased stride length;Narrow base of support;Trunk flexed Gait velocity: reduced Gait velocity interpretation: Below normal speed for  age/gender General Gait Details: tends to get caught on obstacles on her L side  Stairs            Wheelchair Mobility    Modified Rankin (Stroke Patients Only) Modified Rankin (Stroke Patients Only) Pre-Morbid Rankin Score: Slight disability Modified Rankin: Moderately severe disability     Balance Overall balance assessment: Needs assistance Sitting-balance support: Feet supported Sitting balance-Leahy Scale: Fair     Standing balance support: Bilateral upper extremity supported;During functional activity Standing balance-Leahy Scale: Poor                               Pertinent Vitals/Pain Pain Assessment: No/denies pain    Home Living Family/patient expects to be discharged to:: Private residence Living Arrangements: Children Available Help at Discharge: Family;Available PRN/intermittently(children work during the day) Type of Home: House Home Access: Stairs to enter Entrance Stairs-Rails: None Technical brewer of Steps: 1 Home Layout: One level Home Equipment: Environmental consultant - 2 wheels;Bedside commode;Cane - single point;Shower seat - built in Additional Comments: pt reports son or daughter are normally there or checking in    Prior Function Level of Independence: Independent with assistive device(s)         Comments: uses RW at home, family does the homemaking, pt bathes with daughter present for supervision, dresses independently     Hand Dominance   Dominant Hand: Right    Extremity/Trunk Assessment   Upper Extremity Assessment Upper Extremity Assessment: Overall WFL for tasks assessed    Lower Extremity Assessment Lower Extremity Assessment: Generalized weakness  Cervical / Trunk Assessment Cervical / Trunk Assessment: Normal  Communication   Communication: No difficulties  Cognition Arousal/Alertness: Awake/alert Behavior During Therapy: WFL for tasks assessed/performed Overall Cognitive Status: Within Functional Limits  for tasks assessed                                        General Comments      Exercises     Assessment/Plan    PT Assessment Patient needs continued PT services  PT Problem List Decreased strength;Decreased range of motion;Decreased activity tolerance;Decreased balance;Decreased mobility;Decreased safety awareness;Obesity       PT Treatment Interventions DME instruction;Gait training;Stair training;Functional mobility training;Therapeutic activities;Therapeutic exercise;Balance training;Neuromuscular re-education;Patient/family education    PT Goals (Current goals can be found in the Care Plan section)  Acute Rehab PT Goals Patient Stated Goal: to get stronger and get home PT Goal Formulation: With patient Time For Goal Achievement: 11/23/17 Potential to Achieve Goals: Good    Frequency Min 2X/week   Barriers to discharge Decreased caregiver support home alone in the day at times    Co-evaluation               AM-PAC PT "6 Clicks" Daily Activity  Outcome Measure Difficulty turning over in bed (including adjusting bedclothes, sheets and blankets)?: A Little Difficulty moving from lying on back to sitting on the side of the bed? : A Little Difficulty sitting down on and standing up from a chair with arms (e.g., wheelchair, bedside commode, etc,.)?: Unable Help needed moving to and from a bed to chair (including a wheelchair)?: A Little Help needed walking in hospital room?: A Little Help needed climbing 3-5 steps with a railing? : A Lot 6 Click Score: 15    End of Session Equipment Utilized During Treatment: Gait belt Activity Tolerance: Patient tolerated treatment well;Patient limited by fatigue Patient left: in chair;with call bell/phone within reach;with chair alarm set Nurse Communication: Mobility status PT Visit Diagnosis: Unsteadiness on feet (R26.81);Muscle weakness (generalized) (M62.81);Hemiplegia and hemiparesis Hemiplegia -  Right/Left: Left Hemiplegia - dominant/non-dominant: Non-dominant Hemiplegia - caused by: Cerebral infarction    Time: 3832-9191 PT Time Calculation (min) (ACUTE ONLY): 26 min   Charges:   PT Evaluation $PT Eval Moderate Complexity: 1 Mod PT Treatments $Gait Training: 8-22 mins   PT G Codes:   PT G-Codes **NOT FOR INPATIENT CLASS** Functional Assessment Tool Used: AM-PAC 6 Clicks Basic Mobility    Ramond Dial 11/09/2017, 10:17 AM   Mee Hives, PT MS Acute Rehab Dept. Number: [place] and [place]

## 2017-11-09 NOTE — NC FL2 (Signed)
[place] LEVEL OF CARE SCREENING TOOL     IDENTIFICATION  Patient Name: Kathleen [name] Birthdate: 1945-09-15 Sex: female Admission Date (Current Location): 11/08/2017  [facility] and [place] Number:  Herbalist and Address:  [facility], [place] [address], [place], [place] [address]      Provider Number: [id number]  Attending Physician Name and Address:  [name], MD  Relative Name and Phone Number:  [name], daughter, [phone]    Current Level of Care: Hospital Recommended Level of Care: [place] Prior Approval Number:    Date Approved/Denied:   PASRR Number: [id number]  Discharge Plan: SNF    Current Diagnoses: Patient Active Problem List   Diagnosis Date Noted  . Acute CVA (cerebrovascular accident) ([place]) 11/08/2017  . DM (diabetes mellitus), type 2 with renal complications ([place]) 31/49/7026  . Altered mental status 07/14/2017  . Knee pain   . Acute lower UTI   . Peripheral arterial disease ([place]) 02/23/2016  . Insulin dependent diabetes mellitus ([place]) 12/18/2015  . Anemia in chronic kidney disease 12/18/2015  . Dyspnea   . Goals of care, counseling/discussion   . SOB (shortness of breath)   . Encounter for palliative care   . Pulmonary vascular congestion   . Pleural effusion, left   . HLD (hyperlipidemia)   . Noncompliance with medication regimen   . Disorientation   . Somnolence   . Acute respiratory failure with hypercapnia ([place])   . Respiratory acidosis   . Hypernatremia   . Systolic and diastolic CHF, acute ([place])   . CHF, acute on chronic ([place]) 08/05/2015  . Elevated troponin 08/05/2015  . CHF (congestive heart failure) ([place]) 08/05/2015  . Acute on chronic heart failure ([place]) 08/05/2015  . Acute on chronic combined systolic and diastolic congestive heart failure ([place])   . Left carotid bruit 11/20/2014  . Syncope and collapse 06/10/2014  . Syncope 06/10/2014  . Essential  hypertension   . Unspecified vitamin D deficiency   . Cellulitis and abscess of right lower extremitiy 02/28/2014  . Chronic combined systolic and diastolic heart failure ([place]) 02/12/2014  . CAD (coronary artery disease)   . Obesity   . Meningioma ([place])   . Anemia   . ESRD on dialysis ([place]) 01/24/2014  . Hyperlipidemia 01/22/2014  . BPPV (benign paroxysmal positional vertigo) 01/22/2014  . Hypertensive urgency 01/21/2014  . Tobacco abuse 01/21/2014  . Diabetes mellitus due to abnormal insulin ([place]) 01/21/2014    Orientation RESPIRATION BLADDER Height & Weight     Self, Time, Situation, Place  Normal Continent Weight: 85 kg (187 lb 6.3 oz) Height:  5\' 3"  (160 cm)  BEHAVIORAL SYMPTOMS/MOOD NEUROLOGICAL BOWEL NUTRITION STATUS      Continent Diet(Please see DC Summary)  AMBULATORY STATUS COMMUNICATION OF NEEDS Skin   Limited Assist Verbally Normal                       Personal Care Assistance Level of Assistance  Bathing, Feeding, Dressing Bathing Assistance: Limited assistance Feeding assistance: Independent Dressing Assistance: Limited assistance     Functional Limitations Info             SPECIAL CARE FACTORS FREQUENCY  PT (By licensed PT)     PT Frequency: 5x/week              Contractures      Additional Factors Info  Code Status, Allergies, Insulin Sliding Scale Code Status Info: Full  Allergies Info: Baclofen   Insulin Sliding Scale Info: 3x dialy with meals       Current Medications (11/09/2017):  This is the current hospital active medication list Current Facility-Administered Medications  Medication Dose Route Frequency Provider Last Rate Last Dose  .  stroke: mapping our early stages of recovery book   Does not apply Once Rise Patience, MD      . acetaminophen (TYLENOL) tablet 650 mg  650 mg Oral Q4H PRN Rise Patience, MD       Or  . acetaminophen (TYLENOL) solution 650 mg  650 mg Per Tube Q4H PRN Rise Patience, MD        Or  . acetaminophen (TYLENOL) suppository 650 mg  650 mg Rectal Q4H PRN Rise Patience, MD      . aspirin suppository 300 mg  300 mg Rectal Daily Rise Patience, MD       Or  . aspirin tablet 325 mg  325 mg Oral Daily Rise Patience, MD   325 mg at 11/09/17 1023  . atorvastatin (LIPITOR) tablet 40 mg  40 mg Oral q1800 Rise Patience, MD      . brimonidine (ALPHAGAN) 0.2 % ophthalmic solution 1 drop  1 drop Both Eyes BID PRN Rise Patience, MD       And  . timolol (TIMOPTIC) 0.5 % ophthalmic solution 1 drop  1 drop Both Eyes BID PRN Rise Patience, MD      . calcium acetate (PHOSLO) capsule 2,001 mg  2,001 mg Oral TID WC Rise Patience, MD   2,001 mg at 11/09/17 1023  . calcium acetate (PHOSLO) capsule 667 mg  667 mg Oral PRN Rise Patience, MD      . carvedilol (COREG) tablet 25 mg  25 mg Oral BID Rise Patience, MD   Stopped at 11/09/17 1024  . gabapentin (NEURONTIN) capsule 100 mg  100 mg Oral QHS Rise Patience, MD   100 mg at 11/09/17 0229  . heparin injection 5,000 Units  5,000 Units Subcutaneous Q8H Rise Patience, MD   5,000 Units at 11/09/17 0654  . HYDROcodone-acetaminophen (NORCO/VICODIN) 5-325 MG per tablet 1 tablet  1 tablet Oral Q6H PRN Rise Patience, MD      . insulin aspart (novoLOG) injection 0-9 Units  0-9 Units Subcutaneous TID WC Rise Patience, MD      . insulin detemir (LEVEMIR) injection 15 Units  15 Units Subcutaneous QHS Rise Patience, MD   15 Units at 11/09/17 0232  . multivitamin (RENA-VIT) tablet 1 tablet  1 tablet Oral QHS Rise Patience, MD   1 tablet at 11/09/17 1884     Discharge Medications: Please see discharge summary for a list of discharge medications.  Relevant Imaging Results:  Relevant Lab Results:   Additional Information SS#: [id number]. Gets HD TTS at [facility], [place]

## 2017-11-09 NOTE — Progress Notes (Signed)
  Echocardiogram 2D Echocardiogram has been performed.  Kathleen [name] 11/09/2017, 9:35 AM

## 2017-11-09 NOTE — Evaluation (Addendum)
Occupational Therapy Evaluation Patient Details Name: Kathleen [name] MRN: [id number] DOB: 1945/09/14 Today's Date: 11/09/2017    History of Present Illness 72 yo female with onset of HA and blurred vision for two months received an MRI showing 2 focal corona radiata strokes, sent to ER.  Imaging shows partial B carotid stenosis and atherosclerosis.  PMHx:  meningioma, DM, ESRD, HTN, CAD, CHF   Clinical Impression   This 72 y/o F presents with the above. At baseline pt reports mod independence with functional mobility using RW, mod independent with ADLs with supervision provided during showering. Pt completing room level functional mobility with overall minGuard at RW level this session, currently requires MinA for LB ADLs, presenting with generalized weakness, decreased dynamic balance. Pt will benefit from continued acute OT services, recommend pt return home with 24hr supervision initially and follow [place] services to maximize her safety and independence with ADLs and mobility. If necessary supervision/assist is not available at time of discharge may need to consider SNF placement.     Follow Up Recommendations  Home health OT;Supervision/Assistance - 24 hour(24hr initially)    Equipment Recommendations  None recommended by OT;Other (comment)(pt's DME needs are met )           Precautions / Restrictions Precautions Precautions: Fall Restrictions Weight Bearing Restrictions: No      Mobility Bed Mobility Overal bed mobility: Needs Assistance Bed Mobility: Supine to Sit;Sit to Supine     Supine to sit: Min guard Sit to supine: Min guard   General bed mobility comments: minguard for safety, HOB slightly elevated when trasitioning to sitting EOB; minguard and increased time/effort to return to supine, though no physical assist needed   Transfers Overall transfer level: Needs assistance Equipment used: Rolling walker (2 wheeled) Transfers: Sit to/from Stand Sit to Stand: Min  guard         General transfer comment: stood from EOB x2, overall minGuard for safety with cues for safe hand placement when returning to sitting     Balance Overall balance assessment: Needs assistance Sitting-balance support: Feet supported;No upper extremity supported Sitting balance-Leahy Scale: Good Sitting balance - Comments: pt sitting EOB to finish yogurt with no LOB noted    Standing balance support: Bilateral upper extremity supported;During functional activity Standing balance-Leahy Scale: Fair Standing balance comment: pt briefly maintains static standing at sink without UE support and minGuard; mostly utilizing forearms on sink for added support while washing face                            ADL either performed or assessed with clinical judgement   ADL Overall ADL's : Needs assistance/impaired Eating/Feeding: Modified independent;Sitting   Grooming: Wash/dry face;Min guard;Standing   Upper Body Bathing: Min guard;Sitting   Lower Body Bathing: Min guard;Sit to/from stand   Upper Body Dressing : Set up;Sitting   Lower Body Dressing: Minimal assistance;Sit to/from stand   Toilet Transfer: Min guard;Minimal assistance;Ambulation;Regular Toilet;RW Toilet Transfer Details (indicate cue type and reason): simulated in transfer to/from EOB  Toileting- Clothing Manipulation and Hygiene: Minimal assistance;Sit to/from stand       Functional mobility during ADLs: Minimal assistance;Min guard;Rolling walker       Vision Baseline Vision/History: No visual deficits Patient Visual Report: (slight blurring of vision ) Vision Assessment?: Yes Eye Alignment: Within Functional Limits Ocular Range of Motion: Within Functional Limits Alignment/Gaze Preference: Within Defined Limits Tracking/Visual Pursuits: Able to track stimulus in all quads without difficulty  Visual Fields: No apparent deficits                Pertinent Vitals/Pain Pain Assessment:  No/denies pain     Hand Dominance Right   Extremity/Trunk Assessment Upper Extremity Assessment Upper Extremity Assessment: LUE deficits/detail;RUE deficits/detail RUE Deficits / Details: pt reports numbness in bil hands (L>R); strength grossly 3+/5; grip strength WFL  LUE Deficits / Details: pt reports numbness in bil hands (L>R); strength grossly 4-/5; grip strength WFL   Lower Extremity Assessment Lower Extremity Assessment: Defer to PT evaluation   Cervical / Trunk Assessment Cervical / Trunk Assessment: Normal   Communication Communication Communication: No difficulties   Cognition Arousal/Alertness: Awake/alert Behavior During Therapy: WFL for tasks assessed/performed Overall Cognitive Status: Within Functional Limits for tasks assessed                                                      Home Living Family/patient expects to be discharged to:: Private residence Living Arrangements: Children Available Help at Discharge: Family;Available PRN/intermittently(children work during the day, reports ex-husband could likely stay with her during the day ) Type of Home: House Home Access: Stairs to enter [employer] of Steps: 1 Entrance Stairs-Rails: None Home Layout: One level     Bathroom Shower/Tub: Occupational psychologist: Handicapped height     Home Equipment: Environmental consultant - 2 wheels;Bedside commode;Cane - single point;Shower seat - built in   Additional Comments: pt reports son or daughter are normally there or checking in; pt reports ex-husband usually drives her around during the day and would be able to stay with her during the day if needed       Prior Functioning/Environment Level of Independence: Independent with assistive device(s)        Comments: uses RW at home, family does the homemaking, pt bathes with daughter present for supervision, dresses independently        OT Problem List: Decreased strength;Impaired  balance (sitting and/or standing);Decreased activity tolerance      OT Treatment/Interventions: Self-care/ADL training;DME and/or AE instruction;Therapeutic activities;Balance training;Patient/family education    OT Goals(Current goals can be found in the care plan section) Acute Rehab OT Goals Patient Stated Goal: to get stronger and get home OT Goal Formulation: With patient Time For Goal Achievement: 11/23/17 Potential to Achieve Goals: Good  OT Frequency: Min 2X/week                             AM-PAC PT "6 Clicks" Daily Activity     Outcome Measure Help from another person eating meals?: None Help from another person taking care of personal grooming?: A Little Help from another person toileting, which includes using toliet, bedpan, or urinal?: A Little Help from another person bathing (including washing, rinsing, drying)?: A Little Help from another person to put on and taking off regular upper body clothing?: None Help from another person to put on and taking off regular lower body clothing?: A Little 6 Click Score: 20   End of Session Equipment Utilized During Treatment: Gait belt;Rolling walker Nurse Communication: Mobility status  Activity Tolerance: Patient tolerated treatment well Patient left: in bed;with call bell/phone within reach;with bed alarm set  OT Visit Diagnosis: Other symptoms and signs involving the nervous system (R29.898);Unsteadiness on feet (R26.81)  Time: 1022(-7 when speaking with MD)-1059 OT Time Calculation (min): 37 min Charges:  OT General Charges $OT Visit: 1 Visit OT Evaluation $OT Eval Moderate Complexity: 1 Mod OT Treatments $Self Care/Home Management : 8-22 mins G-Codes:     Lou Cal, OT Pager [phone] 11/09/2017   Raymondo Band 11/09/2017, 11:39 AM

## 2017-11-09 NOTE — Progress Notes (Signed)
PROGRESS NOTE    Kathleen [name]   VCB:[id number]  DOB: Dec 11, 1945  DOA: 11/08/2017 PCP: Benito Mccreedy, MD   Brief Narrative:  Kathleen [name] is a 72 y.o. female with history of ESRD on hemodialysis, IIDM2, HTN,  anemia, hyperlipidemia was referred to the ER as an outpt MRI showed a stroke   Patient has been having headache mostly in the occipital area with at times dizziness with blurred vision over the last 2 months.  Patient's primary care physician ordered MRI of the brain which showed 2 small lacunar infarct involving the left corona radiata.   Subjective: Headache is on the back of her head when she sits up and goes away on laying flat. She has no focal weakness ROS: no complaints of nausea, vomiting, constipation diarrhea, cough, dyspnea   No other complaints.   Assessment & Plan:   Principal Problem:   Acute CVA (cerebrovascular accident) - Neuro following - CT head: few nonspecific foci of hypoattenuation in white matter including the left corona radiata which may correspond to infarctions as reported on outside MRI of the brain. CTA neck: 1. Left proximal ICA  less than 50% stenosis . 2. Left mid common carotid  moderate 50% stenosis w  3. Left distal common carotid and proximal ICA stable mild less than 50% stenosis w  4. Retrograde flow in the left vertebral artery, uncertain patency of the V1 segment which does not enhance. 5. Left subclavian artery stable moderate 50-70% stenosis   CTA head:  mild-to-moderate stenosis in the anterior and posterior circulations. - 2 D ECHO> no stenosis - 40 mg Lipitor, aspirin 325 (takes 81 at home), await stroke team plan   Active Problems:  Meningioma CT>  Left parietal paramedian extra-axial mass measuring 28 x 22 x 7 mm compatible with meningioma.  HTN - hold Coreg and Hydralazine due to CVA    ESRD on dialysis - appreciate neprho f/u- will be dialyzed today per nephro note    Chronic combined systolic and  diastolic heart failure  - fluid management with dialysis     DM (diabetes mellitus), type 2 with renal complications (HCC) - Levemir at home dose and SSI  DVT prophylaxis: Heparin Code Status: full code Family Communication:  Disposition Plan: await stroke team plan, dialysis today, PT recommends SNF Consultants:   Neurology  Nephrology  Procedures:   2 D ECHO Study Conclusions  - Left ventricle: The cavity size was normal. There was moderate   concentric hypertrophy. Systolic function was normal. The   estimated ejection fraction was in the range of 60% to 65%. Wall   motion was normal; there were no regional wall motion   abnormalities. There was an increased relative contribution of   atrial contraction to ventricular filling. Doppler parameters are   consistent with abnormal left ventricular relaxation (grade 1   diastolic dysfunction). Doppler parameters are consistent with   high ventricular filling pressure. - Mitral valve: Calcified annulus. - Tricuspid valve: There was trivial regurgitation. Antimicrobials:  Anti-infectives (From admission, onward)   None       Objective: Vitals:   11/09/17 0323 11/09/17 0357 11/09/17 0552 11/09/17 1322  BP: (!) 172/68 (!) 172/98 (!) 154/60 (!) 153/61  Pulse: 72 72 67 70  Resp: 17  17 17   Temp:   97.8 F (36.6 C) 97.6 F (36.4 C)  TempSrc:   Oral Oral  SpO2: 100%  100% 98%  Weight:  85 kg (187 lb 6.3 oz)  Height:  5\' 3"  (1.6 m)     No intake or output data in the 24 hours ending 11/09/17 1640 Filed Weights   11/08/17 1606 11/09/17 0357  Weight: 80.7 kg (178 lb) 85 kg (187 lb 6.3 oz)    Examination: General exam: Appears comfortable  HEENT: PERRLA, oral mucosa moist, no sclera icterus or thrush Respiratory system: Clear to auscultation. Respiratory effort normal. Cardiovascular system: S1 & S2 heard, RRR.  No murmurs  Gastrointestinal system: Abdomen soft, non-tender, nondistended. Normal bowel sound. No  organomegaly Central nervous system: Alert and oriented. No focal neurological deficits. Extremities: No cyanosis, clubbing or edema Skin: No rashes or ulcers Psychiatry:  Mood & affect appropriate.     Data Reviewed: I have personally reviewed following labs and imaging studies  CBC: Recent Labs  Lab 11/08/17 1648 11/08/17 1659 11/09/17 0329  WBC  --  4.7 4.4  NEUTROABS  --  2.4  --   HGB 10.5* 10.0* 9.8*  HCT 31.0* 31.9* 31.3*  MCV  --  100.9* 100.6*  PLT  --  181 710   Basic Metabolic Panel: Recent Labs  Lab 11/08/17 1648 11/08/17 1659 11/09/17 0329  NA 137 134*  --   K 4.3 4.2  --   CL 96* 95*  --   CO2  --  27  --   GLUCOSE 361* 358*  --   BUN 35* 34*  --   CREATININE 4.00* 4.02* 4.59*  CALCIUM  --  8.8*  --    GFR: Estimated Creatinine Clearance: 11.3 mL/min (A) (by C-G formula based on SCr of 4.59 mg/dL (H)). Liver Function Tests: Recent Labs  Lab 11/08/17 1659  AST 19  ALT 8*  ALKPHOS 82  BILITOT 0.7  PROT 6.6  ALBUMIN 3.6   No results for input(s): LIPASE, AMYLASE in the last 168 hours. No results for input(s): AMMONIA in the last 168 hours. Coagulation Profile: Recent Labs  Lab 11/08/17 1659  INR 1.11   Cardiac Enzymes: No results for input(s): CKTOTAL, CKMB, CKMBINDEX, TROPONINI in the last 168 hours. BNP (last 3 results) No results for input(s): PROBNP in the last 8760 hours. HbA1C: Recent Labs    11/09/17 0329  HGBA1C 7.5*   CBG: Recent Labs  Lab 11/08/17 1858 11/09/17 0225 11/09/17 0803 11/09/17 1232  GLUCAP 287* 204* 85 101*   Lipid Profile: Recent Labs    11/09/17 0329  CHOL 234*  HDL 62  LDLCALC 154*  TRIG 88  CHOLHDL 3.8   Thyroid Function Tests: No results for input(s): TSH, T4TOTAL, FREET4, T3FREE, THYROIDAB in the last 72 hours. Anemia Panel: No results for input(s): VITAMINB12, FOLATE, FERRITIN, TIBC, IRON, RETICCTPCT in the last 72 hours. Urine analysis:    Component Value Date/Time   COLORURINE  YELLOW 07/13/2017 2335   APPEARANCEUR CLEAR 07/13/2017 2335   LABSPEC 1.009 07/13/2017 2335   PHURINE 6.0 07/13/2017 2335   GLUCOSEU NEGATIVE 07/13/2017 2335   HGBUR NEGATIVE 07/13/2017 2335   BILIRUBINUR NEGATIVE 07/13/2017 2335   KETONESUR NEGATIVE 07/13/2017 2335   PROTEINUR 100 (A) 07/13/2017 2335   UROBILINOGEN 0.2 06/11/2014 0639   NITRITE NEGATIVE 07/13/2017 2335   LEUKOCYTESUR NEGATIVE 07/13/2017 2335   Sepsis Labs: @LABRCNTIP (procalcitonin:4,lacticidven:4) ) Recent Results (from the past 240 hour(s))  MRSA PCR Screening     Status: None   Collection Time: 11/09/17  8:34 AM  Result Value Ref Range Status   MRSA by PCR NEGATIVE NEGATIVE Final    Comment:  The GeneXpert MRSA Assay (FDA approved for NASAL specimens only), is one component of a comprehensive MRSA colonization surveillance program. It is not intended to diagnose MRSA infection nor to guide or monitor treatment for MRSA infections. Performed at [facility], [place] [address], [place], [place] [address]          Radiology Studies: Ct Angio Head W Or Wo Contrast  Result Date: 11/08/2017 CLINICAL DATA:  72 y/o F; stroke patient, follow-up angio study. Outside MRI of the brain with reported subcentimeter infarctions within in the left corona radiata. EXAM: CT ANGIOGRAPHY HEAD AND NECK TECHNIQUE: Multidetector CT imaging of the head and neck was performed using the standard protocol during bolus administration of intravenous contrast. Multiplanar CT image reconstructions and MIPs were obtained to evaluate the vascular anatomy. Carotid stenosis measurements (when applicable) are obtained utilizing NASCET criteria, using the distal internal carotid diameter as the denominator. CONTRAST:  19mL ISOVUE-370 IOPAMIDOL (ISOVUE-370) INJECTION 76% COMPARISON:  07/14/2017 CTA of head and neck. FINDINGS: CT HEAD FINDINGS Brain: No large acute vascular territory infarction, hemorrhage, or focal mass effect of the  brain. There are few nonspecific foci of hypoattenuation in white matter including the left corona radiata which may correspond to infarctions as reported on outside MRI of the brain. Left parietal paramedian extra-axial mass measuring 28 x 22 x 7 mm (AP x ML x CC series 8, image 39 and series 9, image 31) compatible with meningioma. Vascular: As below. Skull: Normal. Negative for fracture or focal lesion. Sinuses: Imaged portions are clear. Orbits: No acute finding. Review of the MIP images confirms the above findings CTA NECK FINDINGS Aortic arch: Bovine variant branching. Imaged portion shows no evidence of aneurysm or dissection. Left subclavian artery calcified plaque with moderate 50-70% stenosis. Calcific atherosclerosis of aortic arch. Right carotid system: No dissection or aneurysm identified. Calcified plaque of carotid bifurcation and proximal ICA with mild less than 50% proximal ICA stenosis. Left carotid system: No dissection or aneurysm identified. Left common carotid fibrofatty plaque with moderate 50% stenosis (series 12, image 235). Mixed plaque of the carotid bifurcation with mild less than 50% distal cavernous and proximal ICA stenosis. Vertebral arteries: Codominant. No evidence of dissection, stenosis (50% or greater) or occlusion of the right vertebral artery. The left vertebral artery enhances with increased attenuation superiorly and gradient decreased attenuation of the V1 segment which is poorly enhanced indicating retrograde flow. Skeleton: Moderate cervical spondylosis with discogenic degenerative changes greatest at the C5-C7 levels. No high-grade bony canal stenosis. Other neck: Negative. Upper chest: Negative. Review of the MIP images confirms the above findings CTA HEAD FINDINGS Anterior circulation: Mild right paraophthalmic ICA and left cavernous ICA stenosis. Mild left terminal ICA stenosis. No large vessel occlusion, aneurysm, or high-grade stenosis identified. Posterior  circulation: Moderate stenosis of proximal basilar artery. Segments of mild stenosis of bilateral P2 segments. No large vessel occlusion, aneurysm, or high-grade stenosis identified. Venous sinuses: As permitted by contrast timing, patent. Anatomic variants: Bilateral fetal PCA and small caliber vertebrobasilar system. Delayed phase: No abnormal intracranial enhancement. Review of the MIP images confirms the above findings IMPRESSION: CT head: 1. No large acute vascular territory infarction,, mass effect, or hemorrhage. 2. Several nonspecific foci of hypoattenuation in white matter including within left corona radiata which may correspond to reported infarctions on outside MRI. CTA neck: 1. Left proximal ICA stable mild less than 50% stenosis with calcified plaque. 2. Left mid common carotid stable moderate 50% stenosis with fibrofatty plaque. 3. Left  distal common carotid and proximal ICA stable mild less than 50% stenosis with mixed plaque of the bifurcation. 4. Retrograde flow in the left vertebral artery, uncertain patency of the V1 segment which does not enhance. 5. Left subclavian artery stable moderate 50-70% stenosis with calcified plaque. CTA head: 1. No large vessel occlusion, aneurysm, or high-grade stenosis identified. 2. Intracranial atherosclerosis with multiple areas of mild-to-moderate stenosis in the anterior and posterior circulations. Electronically Signed   By: Kristine Garbe M.D.   On: 11/08/2017 22:40   Ct Angio Neck W Or Wo Contrast  Result Date: 11/08/2017 CLINICAL DATA:  72 y/o F; stroke patient, follow-up angio study. Outside MRI of the brain with reported subcentimeter infarctions within in the left corona radiata. EXAM: CT ANGIOGRAPHY HEAD AND NECK TECHNIQUE: Multidetector CT imaging of the head and neck was performed using the standard protocol during bolus administration of intravenous contrast. Multiplanar CT image reconstructions and MIPs were obtained to evaluate the  vascular anatomy. Carotid stenosis measurements (when applicable) are obtained utilizing NASCET criteria, using the distal internal carotid diameter as the denominator. CONTRAST:  55mL ISOVUE-370 IOPAMIDOL (ISOVUE-370) INJECTION 76% COMPARISON:  07/14/2017 CTA of head and neck. FINDINGS: CT HEAD FINDINGS Brain: No large acute vascular territory infarction, hemorrhage, or focal mass effect of the brain. There are few nonspecific foci of hypoattenuation in white matter including the left corona radiata which may correspond to infarctions as reported on outside MRI of the brain. Left parietal paramedian extra-axial mass measuring 28 x 22 x 7 mm (AP x ML x CC series 8, image 39 and series 9, image 31) compatible with meningioma. Vascular: As below. Skull: Normal. Negative for fracture or focal lesion. Sinuses: Imaged portions are clear. Orbits: No acute finding. Review of the MIP images confirms the above findings CTA NECK FINDINGS Aortic arch: Bovine variant branching. Imaged portion shows no evidence of aneurysm or dissection. Left subclavian artery calcified plaque with moderate 50-70% stenosis. Calcific atherosclerosis of aortic arch. Right carotid system: No dissection or aneurysm identified. Calcified plaque of carotid bifurcation and proximal ICA with mild less than 50% proximal ICA stenosis. Left carotid system: No dissection or aneurysm identified. Left common carotid fibrofatty plaque with moderate 50% stenosis (series 12, image 235). Mixed plaque of the carotid bifurcation with mild less than 50% distal cavernous and proximal ICA stenosis. Vertebral arteries: Codominant. No evidence of dissection, stenosis (50% or greater) or occlusion of the right vertebral artery. The left vertebral artery enhances with increased attenuation superiorly and gradient decreased attenuation of the V1 segment which is poorly enhanced indicating retrograde flow. Skeleton: Moderate cervical spondylosis with discogenic degenerative  changes greatest at the C5-C7 levels. No high-grade bony canal stenosis. Other neck: Negative. Upper chest: Negative. Review of the MIP images confirms the above findings CTA HEAD FINDINGS Anterior circulation: Mild right paraophthalmic ICA and left cavernous ICA stenosis. Mild left terminal ICA stenosis. No large vessel occlusion, aneurysm, or high-grade stenosis identified. Posterior circulation: Moderate stenosis of proximal basilar artery. Segments of mild stenosis of bilateral P2 segments. No large vessel occlusion, aneurysm, or high-grade stenosis identified. Venous sinuses: As permitted by contrast timing, patent. Anatomic variants: Bilateral fetal PCA and small caliber vertebrobasilar system. Delayed phase: No abnormal intracranial enhancement. Review of the MIP images confirms the above findings IMPRESSION: CT head: 1. No large acute vascular territory infarction,, mass effect, or hemorrhage. 2. Several nonspecific foci of hypoattenuation in white matter including within left corona radiata which may correspond to reported infarctions on outside MRI.  CTA neck: 1. Left proximal ICA stable mild less than 50% stenosis with calcified plaque. 2. Left mid common carotid stable moderate 50% stenosis with fibrofatty plaque. 3. Left distal common carotid and proximal ICA stable mild less than 50% stenosis with mixed plaque of the bifurcation. 4. Retrograde flow in the left vertebral artery, uncertain patency of the V1 segment which does not enhance. 5. Left subclavian artery stable moderate 50-70% stenosis with calcified plaque. CTA head: 1. No large vessel occlusion, aneurysm, or high-grade stenosis identified. 2. Intracranial atherosclerosis with multiple areas of mild-to-moderate stenosis in the anterior and posterior circulations. Electronically Signed   By: Kristine Garbe M.D.   On: 11/08/2017 22:40      Scheduled Meds: .  stroke: mapping our early stages of recovery book   Does not apply Once    . aspirin  300 mg Rectal Daily   Or  . aspirin  325 mg Oral Daily  . atorvastatin  40 mg Oral q1800  . calcium acetate  2,001 mg Oral TID WC  . carvedilol  25 mg Oral BID  . [START ON 11/11/2017] doxercalciferol  4 mcg Intravenous Q T,Th,Sa-HD  . gabapentin  100 mg Oral QHS  . heparin  5,000 Units Subcutaneous Q8H  . insulin aspart  0-9 Units Subcutaneous TID WC  . insulin detemir  15 Units Subcutaneous QHS  . multivitamin  1 tablet Oral QHS   Continuous Infusions:   LOS: 0 days    Time spent in minutes: 35    [name], MD [facility] Pager: [email or web address] Password [email or web address] 11/09/2017, 4:40 PM

## 2017-11-09 NOTE — Progress Notes (Signed)
Stroke Team Progress Note  Kathleen [name] is a 72 y.o. female who has a past medical history significant for coronary artery disease, diabetes, end-stage renal disease on dialysis Tuesday/Thursday/Saturday, hyperlipidemia, hypertension, systolic CHF, documented meningioma incidental finding diagnosed in 01/2014, who presented to the emergency room after she got an MRI of her head at [facility] (report in [place]) that revealed 2 subcentimeter lacunar infarctions in the left coronary radiata probably early subacute.  She was sent to the emergency room for further evaluation. Patient says that she has been having some right sided posterior head discomfort/headache for the past 2 months.  She got an MRI done for this reason at the outside hospital the day.  The MRI showed the lacunar infarcts as above. She denies any tingling or numbness.  She denies any focal weakness.  She reports off-and-on cramping in her left leg and left-sided numbness.  Denies any right-sided symptoms. Denies any visual symptoms.  Denies blurred vision.  Has no history of migraines.  LKW: Unclear-gives a two-month history of last known normal tpa given?: no, outside the window Premorbid modified Rankin scale (mRS): 2 NIH stroke scale-1 for sensory on the ipsilateral side of the strokes.   SUBJECTIVE Patient states that she had an outpatient evaluation with brain MRI due to persistent occipital headaches for more than a month. MRI done and no and imaging showed 2 tiny left corona radiata lacunar infarcts which do not explain her symptoms. She complains of some numbness in the left side but no right body symptoms  OBJECTIVE Most recent Vital Signs: Temp: 97.6 F (36.4 C) (04/04 1322) Temp Source: Oral (04/04 1322) BP: 153/61 (04/04 1322) Pulse Rate: 70 (04/04 1322) Respiratory Rate: 17 O2 Saturdation: 98%  CBG (last 3)  Recent Labs    11/09/17 0803 11/09/17 1232 11/09/17 1715  GLUCAP 85 101* 133*    Diet:  Fall precautions Fall precautions Diet renal/carb modified with fluid restriction Diet-HS Snack? Nothing; Fluid restriction: 1200 mL Fluid; Room service appropriate? Yes; Fluid consistency: Thin   liquids  Activity: Up with assistance   VTE Prophylaxis:   scds Studies: Results for orders placed or performed during the hospital encounter of 11/08/17 (from the past 24 hour(s))  CBG monitoring, ED     Status: Abnormal   Collection Time: 11/08/17  6:58 PM  Result Value Ref Range   Glucose-Capillary 287 (H) 65 - 99 mg/dL   Comment 1 Notify RN    Comment 2 Document in Chart   CBG monitoring, ED     Status: Abnormal   Collection Time: 11/09/17  2:25 AM  Result Value Ref Range   Glucose-Capillary 204 (H) 65 - 99 mg/dL  Hemoglobin A1c     Status: Abnormal   Collection Time: 11/09/17  3:29 AM  Result Value Ref Range   Hgb A1c MFr Bld 7.5 (H) 4.8 - 5.6 %   Mean Plasma Glucose 168.55 mg/dL  Lipid panel     Status: Abnormal   Collection Time: 11/09/17  3:29 AM  Result Value Ref Range   Cholesterol 234 (H) 0 - 200 mg/dL   Triglycerides 88 <150 mg/dL   HDL 62 >40 mg/dL   Total CHOL/HDL Ratio 3.8 RATIO   VLDL 18 0 - 40 mg/dL   LDL Cholesterol 154 (H) 0 - 99 mg/dL  CBC     Status: Abnormal   Collection Time: 11/09/17  3:29 AM  Result Value Ref Range   WBC 4.4 4.0 - 10.5 K/uL   RBC  3.11 (L) 3.87 - 5.11 MIL/uL   Hemoglobin 9.8 (L) 12.0 - 15.0 g/dL   HCT 31.3 (L) 36.0 - 46.0 %   MCV 100.6 (H) 78.0 - 100.0 fL   MCH 31.5 26.0 - 34.0 pg   MCHC 31.3 30.0 - 36.0 g/dL   RDW 15.8 (H) 11.5 - 15.5 %   Platelets 175 150 - 400 K/uL  Creatinine, serum     Status: Abnormal   Collection Time: 11/09/17  3:29 AM  Result Value Ref Range   Creatinine, Ser 4.59 (H) 0.44 - 1.00 mg/dL   GFR calc non Af Amer 9 (L) >60 mL/min   GFR calc Af Amer 10 (L) >60 mL/min  Glucose, capillary     Status: None   Collection Time: 11/09/17  8:03 AM  Result Value Ref Range   Glucose-Capillary 85 65 - 99 mg/dL  MRSA PCR  Screening     Status: None   Collection Time: 11/09/17  8:34 AM  Result Value Ref Range   MRSA by PCR NEGATIVE NEGATIVE  Glucose, capillary     Status: Abnormal   Collection Time: 11/09/17 12:32 PM  Result Value Ref Range   Glucose-Capillary 101 (H) 65 - 99 mg/dL  Glucose, capillary     Status: Abnormal   Collection Time: 11/09/17  5:15 PM  Result Value Ref Range   Glucose-Capillary 133 (H) 65 - 99 mg/dL     Ct Angio Head W Or Wo Contrast  Result Date: 11/08/2017 CLINICAL DATA:  72 y/o F; stroke patient, follow-up angio study. Outside MRI of the brain with reported subcentimeter infarctions within in the left corona radiata. EXAM: CT ANGIOGRAPHY HEAD AND NECK TECHNIQUE: Multidetector CT imaging of the head and neck was performed using the standard protocol during bolus administration of intravenous contrast. Multiplanar CT image reconstructions and MIPs were obtained to evaluate the vascular anatomy. Carotid stenosis measurements (when applicable) are obtained utilizing NASCET criteria, using the distal internal carotid diameter as the denominator. CONTRAST:  28m ISOVUE-370 IOPAMIDOL (ISOVUE-370) INJECTION 76% COMPARISON:  07/14/2017 CTA of head and neck. FINDINGS: CT HEAD FINDINGS Brain: No large acute vascular territory infarction, hemorrhage, or focal mass effect of the brain. There are few nonspecific foci of hypoattenuation in white matter including the left corona radiata which may correspond to infarctions as reported on outside MRI of the brain. Left parietal paramedian extra-axial mass measuring 28 x 22 x 7 mm (AP x ML x CC series 8, image 39 and series 9, image 31) compatible with meningioma. Vascular: As below. Skull: Normal. Negative for fracture or focal lesion. Sinuses: Imaged portions are clear. Orbits: No acute finding. Review of the MIP images confirms the above findings CTA NECK FINDINGS Aortic arch: Bovine variant branching. Imaged portion shows no evidence of aneurysm or  dissection. Left subclavian artery calcified plaque with moderate 50-70% stenosis. Calcific atherosclerosis of aortic arch. Right carotid system: No dissection or aneurysm identified. Calcified plaque of carotid bifurcation and proximal ICA with mild less than 50% proximal ICA stenosis. Left carotid system: No dissection or aneurysm identified. Left common carotid fibrofatty plaque with moderate 50% stenosis (series 12, image 235). Mixed plaque of the carotid bifurcation with mild less than 50% distal cavernous and proximal ICA stenosis. Vertebral arteries: Codominant. No evidence of dissection, stenosis (50% or greater) or occlusion of the right vertebral artery. The left vertebral artery enhances with increased attenuation superiorly and gradient decreased attenuation of the V1 segment which is poorly enhanced indicating retrograde flow. Skeleton: Moderate  cervical spondylosis with discogenic degenerative changes greatest at the C5-C7 levels. No high-grade bony canal stenosis. Other neck: Negative. Upper chest: Negative. Review of the MIP images confirms the above findings CTA HEAD FINDINGS Anterior circulation: Mild right paraophthalmic ICA and left cavernous ICA stenosis. Mild left terminal ICA stenosis. No large vessel occlusion, aneurysm, or high-grade stenosis identified. Posterior circulation: Moderate stenosis of proximal basilar artery. Segments of mild stenosis of bilateral P2 segments. No large vessel occlusion, aneurysm, or high-grade stenosis identified. Venous sinuses: As permitted by contrast timing, patent. Anatomic variants: Bilateral fetal PCA and small caliber vertebrobasilar system. Delayed phase: No abnormal intracranial enhancement. Review of the MIP images confirms the above findings IMPRESSION: CT head: 1. No large acute vascular territory infarction,, mass effect, or hemorrhage. 2. Several nonspecific foci of hypoattenuation in white matter including within left corona radiata which may  correspond to reported infarctions on outside MRI. CTA neck: 1. Left proximal ICA stable mild less than 50% stenosis with calcified plaque. 2. Left mid common carotid stable moderate 50% stenosis with fibrofatty plaque. 3. Left distal common carotid and proximal ICA stable mild less than 50% stenosis with mixed plaque of the bifurcation. 4. Retrograde flow in the left vertebral artery, uncertain patency of the V1 segment which does not enhance. 5. Left subclavian artery stable moderate 50-70% stenosis with calcified plaque. CTA head: 1. No large vessel occlusion, aneurysm, or high-grade stenosis identified. 2. Intracranial atherosclerosis with multiple areas of mild-to-moderate stenosis in the anterior and posterior circulations. Electronically Signed   By: Kristine Garbe M.D.   On: 11/08/2017 22:40   Ct Angio Neck W Or Wo Contrast  Result Date: 11/08/2017 CLINICAL DATA:  72 y/o F; stroke patient, follow-up angio study. Outside MRI of the brain with reported subcentimeter infarctions within in the left corona radiata. EXAM: CT ANGIOGRAPHY HEAD AND NECK TECHNIQUE: Multidetector CT imaging of the head and neck was performed using the standard protocol during bolus administration of intravenous contrast. Multiplanar CT image reconstructions and MIPs were obtained to evaluate the vascular anatomy. Carotid stenosis measurements (when applicable) are obtained utilizing NASCET criteria, using the distal internal carotid diameter as the denominator. CONTRAST:  36m ISOVUE-370 IOPAMIDOL (ISOVUE-370) INJECTION 76% COMPARISON:  07/14/2017 CTA of head and neck. FINDINGS: CT HEAD FINDINGS Brain: No large acute vascular territory infarction, hemorrhage, or focal mass effect of the brain. There are few nonspecific foci of hypoattenuation in white matter including the left corona radiata which may correspond to infarctions as reported on outside MRI of the brain. Left parietal paramedian extra-axial mass measuring 28 x  22 x 7 mm (AP x ML x CC series 8, image 39 and series 9, image 31) compatible with meningioma. Vascular: As below. Skull: Normal. Negative for fracture or focal lesion. Sinuses: Imaged portions are clear. Orbits: No acute finding. Review of the MIP images confirms the above findings CTA NECK FINDINGS Aortic arch: Bovine variant branching. Imaged portion shows no evidence of aneurysm or dissection. Left subclavian artery calcified plaque with moderate 50-70% stenosis. Calcific atherosclerosis of aortic arch. Right carotid system: No dissection or aneurysm identified. Calcified plaque of carotid bifurcation and proximal ICA with mild less than 50% proximal ICA stenosis. Left carotid system: No dissection or aneurysm identified. Left common carotid fibrofatty plaque with moderate 50% stenosis (series 12, image 235). Mixed plaque of the carotid bifurcation with mild less than 50% distal cavernous and proximal ICA stenosis. Vertebral arteries: Codominant. No evidence of dissection, stenosis (50% or greater) or occlusion of the  right vertebral artery. The left vertebral artery enhances with increased attenuation superiorly and gradient decreased attenuation of the V1 segment which is poorly enhanced indicating retrograde flow. Skeleton: Moderate cervical spondylosis with discogenic degenerative changes greatest at the C5-C7 levels. No high-grade bony canal stenosis. Other neck: Negative. Upper chest: Negative. Review of the MIP images confirms the above findings CTA HEAD FINDINGS Anterior circulation: Mild right paraophthalmic ICA and left cavernous ICA stenosis. Mild left terminal ICA stenosis. No large vessel occlusion, aneurysm, or high-grade stenosis identified. Posterior circulation: Moderate stenosis of proximal basilar artery. Segments of mild stenosis of bilateral P2 segments. No large vessel occlusion, aneurysm, or high-grade stenosis identified. Venous sinuses: As permitted by contrast timing, patent. Anatomic  variants: Bilateral fetal PCA and small caliber vertebrobasilar system. Delayed phase: No abnormal intracranial enhancement. Review of the MIP images confirms the above findings IMPRESSION: CT head: 1. No large acute vascular territory infarction,, mass effect, or hemorrhage. 2. Several nonspecific foci of hypoattenuation in white matter including within left corona radiata which may correspond to reported infarctions on outside MRI. CTA neck: 1. Left proximal ICA stable mild less than 50% stenosis with calcified plaque. 2. Left mid common carotid stable moderate 50% stenosis with fibrofatty plaque. 3. Left distal common carotid and proximal ICA stable mild less than 50% stenosis with mixed plaque of the bifurcation. 4. Retrograde flow in the left vertebral artery, uncertain patency of the V1 segment which does not enhance. 5. Left subclavian artery stable moderate 50-70% stenosis with calcified plaque. CTA head: 1. No large vessel occlusion, aneurysm, or high-grade stenosis identified. 2. Intracranial atherosclerosis with multiple areas of mild-to-moderate stenosis in the anterior and posterior circulations. Electronically Signed   By: Kristine Garbe M.D.   On: 11/08/2017 22:40    Physical Exam:    Obese elderly African-American lady not in distress.Both radial pulses not felt. She has AV fistula for dialysis in the left arm. . Afebrile. Head is nontraumatic. Neck is supple without bruit.    Cardiac exam no murmur or gallop. Lungs are clear to auscultation.   Neurological Exam ;  Awake  Alert oriented x 3. Normal speech and language.eye movements full without nystagmus.fundi were not visualized. Vision acuity and fields appear normal. Hearing is normal. Palatal movements are normal. Face symmetric. Tongue midline. Normal strength, tone, reflexes and coordination. Subjective diminished touch and pinprick sensation in the left hand and left lower extremity only.l sensation. Gait  deferred. . ASSESSMENT Kathleen [name] is a 72 y.o. female with  Silent left brain subcortical lacunar infarcts likely due to small vessel disease. MRI scan also shows small left vertex meningioma which is incidental. Neurovascular imaging otherwise is unremarkable Hospital day # 0  TREATMENT/PLAN  I have personally examined this patient, reviewed notes, independently viewed imaging studies, participated in medical decision making and plan of care.ROS completed by me personally and pertinent positives fully documented  I have made any additions or clarifications directly to the above note. She has presented with nonspecific headaches and brain MRI scan shows a likely clinically silent tiny left subcortical lacunar infarct from small vessel disease. Continue ongoing workup by checking carotid ultrasound and echocardiogram. Change aspirin to Plavix for stroke prevention. Check ESR though clinically doubt temporal arteritis. She has a small incidental convexity meningioma which is not the cause of her symptoms and should be managed conservatively. Long discussion with the patient regarding her clinical presentation is consistent results of imaging studies and plan for evaluation and answered questions. Discussed  with Drs. Drucilla Schmidt 1. Greater than 50% time during this 35 minute visit was spent on counseling and coordination of care about her lacunar infarct, headache, meningioma and answering questions. Antony Contras, MD Medical Director [facility] Pager: [phone] 11/09/2017 5:35 PM   Antony Contras, MD [facility] Pager: [phone] 11/09/2017 5:30 PM

## 2017-11-09 NOTE — Consult Note (Signed)
[facility] Renal Consultation Note    Indication for Consultation:  Management of ESRD/hemodialysis, anemia, hypertension/volume, and secondary hyperparathyroidism. PCP:  HPI: Kathleen [name] is a 72 y.o. female with ESRD, HTN, CAD, Type 2 DM who was admitted after MRI done 4/3 showed early subacute lacunar infarcts.  Pt reports posterior head and R neck pains x 1-2 months then B hand numbness x 1 week. Had been having dialysis issues for the past 2 weeks. Repeated infiltrations of her L AVF last week, underwent fistulogram and TDC placement on 3/29 to rest the AVF. Due to access issues, she had problems getting her fluid off. Ended up coming for extra dialysis on 4/1, but had syncopal episode with vomiting during HD treatment. Now, feels ok. Denies CP, dyspnea, N/V/D, fever.  Dialyzes TTS at [facility]. Last HD 4/2 (and 4/1 extra HD), got close to EDW - left at 82.8kg. Currently using R TDC while L AVF rests s/p multiple infiltrations.  Past Medical History:  Diagnosis Date  . Anemia    a. 01/2014: suspected of chronic disease, negative FOBT.  [name]    knees  . Asthma    many years ago  . CAD (coronary artery disease)    a. Cath 01/2014: mild in LAD, mild-mod LCx, mod-severe RCA - for med rx.  . Diabetes mellitus without complication ([place])   . ESRD (end stage renal disease) ([place])    [place]  . History of echocardiogram    Echo (10/15):  Mod LVH, EF 50-55%, Gr 1 DD, MAC, mild MR, mild TR, PASP 35 mmHg  . Hx of transfusion   . Hyperlipidemia   . Hypertension   . Meningioma ([place])    a. Incidental dx 01/2014.  . Obesity   . Renal insufficiency   . Systolic CHF ([place])    a. 09/945: dx with mixed ICM/NICM (out of proportion to CAD) EF 25-30% by echo.    Past Surgical History:  Procedure Laterality Date  . ABDOMINAL HYSTERECTOMY    . AV FISTULA PLACEMENT Left 02/10/2017   Procedure: ARTERIOVENOUS (AV) FISTULA CREATION-LEFT FOREARM;  Surgeon: Elam Dutch, MD;  Location: [place];  Service: Vascular;  Laterality: Left;  . BREAST SURGERY Right    Lumpectomy  . COLONOSCOPY    . FISTULA SUPERFICIALIZATION Left 08/09/2017   Procedure: FISTULA SUPERFICIALIZATION LEFT ARM ARTERIOVENOUS FISTULA;  Surgeon: Conrad Youngstown, MD;  Location: [place];  Service: Vascular;  Laterality: Left;  . INSERTION OF DIALYSIS CATHETER Right 02/10/2017   Procedure: INSERTION OF DIALYSIS CATHETER;  Surgeon: Elam Dutch, MD;  Location: [place];  Service: Vascular;  Laterality: Right;  . LEFT HEART CATHETERIZATION WITH CORONARY ANGIOGRAM N/A 01/27/2014   Procedure: LEFT HEART CATHETERIZATION WITH CORONARY ANGIOGRAM;  Surgeon: Jettie Booze, MD;  Location: [facility] CATH LAB;  Service: Cardiovascular;  Laterality: N/A;  . TONSILLECTOMY     Family History  Problem Relation Age of Onset  . Diabetic kidney disease Mother   . Hypertension Mother   . Heart failure Mother   . Heart attack Mother   . Hyperlipidemia Mother   . Hypertension Father    Social History:  reports that she has quit smoking. Her smoking use included cigarettes. She has a 7.50 pack-year smoking history. She has never used smokeless tobacco. She reports that she does not drink alcohol or use drugs.  ROS: As per HPI otherwise negative.  Physical Exam: Vitals:   11/09/17 0215 11/09/17 0323 11/09/17 0357 11/09/17  0552  BP: (!) 146/64 (!) 172/68 (!) 172/98 (!) 154/60  Pulse: 69 72 72 67  Resp: (!) 21 17  17   Temp:    97.8 F (36.6 C)  TempSrc:    Oral  SpO2: 94% 100%  100%  Weight:   85 kg (187 lb 6.3 oz)   Height:   5' 3"  (1.6 m)      General: Well developed, well nourished, in no acute distress. Head: Normocephalic, atraumatic, sclera non-icteric, mucus membranes are moist. Neck: Supple without lymphadenopathy/masses. JVD not elevated. Lungs: Clear bilaterally to auscultation without wheezes, rales, or rhonchi. Breathing is unlabored. Heart: RRR with normal S1, S2. No murmurs, rubs, or  gallops appreciated. Abdomen: Soft, non-tender, non-distended with normoactive bowel sounds. No rebound/guarding. Musculoskeletal:  Strength and tone appear normal for age. Lower extremities: No edema or ischemic changes, no open wounds. Neuro: Alert and oriented X 3. Moves all extremities spontaneously. Psych:  Responds to questions appropriately with a normal affect. Dialysis Access: LUE AVF + thrill (moderate bruising), R TDC without erythema  Allergies  Allergen Reactions  . Baclofen     Severe delirium when given to pateint in Dec 2018.    Prior to Admission medications   Medication Sig Start Date End Date Taking? Authorizing Provider  aspirin 81 MG EC tablet Take 1 tablet (81 mg total) by mouth daily. 12/25/15  Yes Cherene Altes, MD  atorvastatin (LIPITOR) 40 MG tablet Take 1 tablet (40 mg total) by mouth daily at 6 PM. 07/19/17  Yes Alekh, Kshitiz, MD  brimonidine-timolol (COMBIGAN) 0.2-0.5 % ophthalmic solution Place 1 drop into the right eye 2 (two) times daily as needed (for glaucoma).    Yes [provider]  calcium acetate (PHOSLO) 667 MG capsule Take 2,001 mg by mouth 3 (three) times daily with meals. Take 667 mg with snack   Yes [provider]  carvedilol (COREG) 25 MG tablet Take 25 mg by mouth 2 (two) times daily. 03/28/17  Yes [provider]  hydrALAZINE (APRESOLINE) 25 MG tablet Take 1 tablet (25 mg total) by mouth every 8 (eight) hours as needed (SBP > 170  or DBP > 110). Patient taking differently: Take 50 mg by mouth every 8 (eight) hours as needed (SBP > 170  or DBP > 110).  02/20/17  Yes Mikhail, Luck, DO  HYDROcodone-acetaminophen (NORCO/VICODIN) 5-325 MG tablet Take 1 tablet by mouth every 6 (six) hours as needed. 08/09/17  Yes Rhyne, Hulen Shouts, PA-C  ibuprofen (ADVIL,MOTRIN) 200 MG tablet Take 400 mg by mouth every 6 (six) hours as needed for mild pain.   Yes [provider]  insulin detemir (LEVEMIR) 100 UNIT/ML injection  Inject 0.15 mLs (15 Units total) into the skin at bedtime. Patient taking differently: Inject 15 Units into the skin at bedtime.  02/20/17  Yes Mikhail, Strawberry, DO  multivitamin (RENA-VIT) TABS tablet Take 1 tablet by mouth at bedtime. 02/20/17  Yes Mikhail, Velta Addison, DO  polyethylene glycol (MIRALAX / GLYCOLAX) packet Take 17 g by mouth daily. Patient taking differently: Take 17 g by mouth daily as needed for mild constipation.  02/20/17  Yes Mikhail, Velta Addison, DO  calcitRIOL (ROCALTROL) 0.5 MCG capsule Take 1 capsule (0.5 mcg total) by mouth Every Tuesday,Thursday,and Saturday with dialysis. Patient not taking: Reported on 11/08/2017 02/21/17   Cristal Ford, DO  gabapentin (NEURONTIN) 100 MG capsule Take 100 mg by mouth at bedtime. 10/18/17   [provider]   Current Facility-Administered Medications  Medication Dose Route  Frequency Provider Last Rate Last Dose  .  stroke: mapping our early stages of recovery book   Does not apply Once Rise Patience, MD      . acetaminophen (TYLENOL) tablet 650 mg  650 mg Oral Q4H PRN Rise Patience, MD       Or  . acetaminophen (TYLENOL) solution 650 mg  650 mg Per Tube Q4H PRN Rise Patience, MD       Or  . acetaminophen (TYLENOL) suppository 650 mg  650 mg Rectal Q4H PRN Rise Patience, MD      . aspirin suppository 300 mg  300 mg Rectal Daily Rise Patience, MD       Or  . aspirin tablet 325 mg  325 mg Oral Daily Rise Patience, MD   325 mg at 11/09/17 1023  . atorvastatin (LIPITOR) tablet 40 mg  40 mg Oral q1800 Rise Patience, MD      . brimonidine (ALPHAGAN) 0.2 % ophthalmic solution 1 drop  1 drop Both Eyes BID PRN Rise Patience, MD       And  . timolol (TIMOPTIC) 0.5 % ophthalmic solution 1 drop  1 drop Both Eyes BID PRN Rise Patience, MD      . calcium acetate (PHOSLO) capsule 2,001 mg  2,001 mg Oral TID WC Rise Patience, MD   2,001 mg at 11/09/17 1240  . calcium acetate  (PHOSLO) capsule 667 mg  667 mg Oral PRN Rise Patience, MD      . carvedilol (COREG) tablet 25 mg  25 mg Oral BID Rise Patience, MD   Stopped at 11/09/17 1024  . gabapentin (NEURONTIN) capsule 100 mg  100 mg Oral QHS Rise Patience, MD   100 mg at 11/09/17 0229  . heparin injection 5,000 Units  5,000 Units Subcutaneous Q8H Rise Patience, MD   5,000 Units at 11/09/17 0654  . HYDROcodone-acetaminophen (NORCO/VICODIN) 5-325 MG per tablet 1 tablet  1 tablet Oral Q6H PRN Rise Patience, MD      . insulin aspart (novoLOG) injection 0-9 Units  0-9 Units Subcutaneous TID WC Rise Patience, MD      . insulin detemir (LEVEMIR) injection 15 Units  15 Units Subcutaneous QHS Rise Patience, MD   15 Units at 11/09/17 0232  . multivitamin (RENA-VIT) tablet 1 tablet  1 tablet Oral QHS Rise Patience, MD   1 tablet at 11/09/17 7673   Labs: Basic Metabolic Panel: Recent Labs  Lab 11/08/17 1648 11/08/17 1659 11/09/17 0329  NA 137 134*  --   K 4.3 4.2  --   CL 96* 95*  --   CO2  --  27  --   GLUCOSE 361* 358*  --   BUN 35* 34*  --   CREATININE 4.00* 4.02* 4.59*  CALCIUM  --  8.8*  --    Liver Function Tests: Recent Labs  Lab 11/08/17 1659  AST 19  ALT 8*  ALKPHOS 82  BILITOT 0.7  PROT 6.6  ALBUMIN 3.6   CBC: Recent Labs  Lab 11/08/17 1648 11/08/17 1659 11/09/17 0329  WBC  --  4.7 4.4  NEUTROABS  --  2.4  --   HGB 10.5* 10.0* 9.8*  HCT 31.0* 31.9* 31.3*  MCV  --  100.9* 100.6*  PLT  --  181 175   Studies/Results: Ct Angio Head W Or Wo Contrast  Result Date: 11/08/2017 CLINICAL DATA:  72 y/o  F; stroke patient, follow-up angio study. Outside MRI of the brain with reported subcentimeter infarctions within in the left corona radiata. EXAM: CT ANGIOGRAPHY HEAD AND NECK TECHNIQUE: Multidetector CT imaging of the head and neck was performed using the standard protocol during bolus administration of intravenous contrast. Multiplanar CT image  reconstructions and MIPs were obtained to evaluate the vascular anatomy. Carotid stenosis measurements (when applicable) are obtained utilizing NASCET criteria, using the distal internal carotid diameter as the denominator. CONTRAST:  65m ISOVUE-370 IOPAMIDOL (ISOVUE-370) INJECTION 76% COMPARISON:  07/14/2017 CTA of head and neck. FINDINGS: CT HEAD FINDINGS Brain: No large acute vascular territory infarction, hemorrhage, or focal mass effect of the brain. There are few nonspecific foci of hypoattenuation in white matter including the left corona radiata which may correspond to infarctions as reported on outside MRI of the brain. Left parietal paramedian extra-axial mass measuring 28 x 22 x 7 mm (AP x ML x CC series 8, image 39 and series 9, image 31) compatible with meningioma. Vascular: As below. Skull: Normal. Negative for fracture or focal lesion. Sinuses: Imaged portions are clear. Orbits: No acute finding. Review of the MIP images confirms the above findings CTA NECK FINDINGS Aortic arch: Bovine variant branching. Imaged portion shows no evidence of aneurysm or dissection. Left subclavian artery calcified plaque with moderate 50-70% stenosis. Calcific atherosclerosis of aortic arch. Right carotid system: No dissection or aneurysm identified. Calcified plaque of carotid bifurcation and proximal ICA with mild less than 50% proximal ICA stenosis. Left carotid system: No dissection or aneurysm identified. Left common carotid fibrofatty plaque with moderate 50% stenosis (series 12, image 235). Mixed plaque of the carotid bifurcation with mild less than 50% distal cavernous and proximal ICA stenosis. Vertebral arteries: Codominant. No evidence of dissection, stenosis (50% or greater) or occlusion of the right vertebral artery. The left vertebral artery enhances with increased attenuation superiorly and gradient decreased attenuation of the V1 segment which is poorly enhanced indicating retrograde flow. Skeleton:  Moderate cervical spondylosis with discogenic degenerative changes greatest at the C5-C7 levels. No high-grade bony canal stenosis. Other neck: Negative. Upper chest: Negative. Review of the MIP images confirms the above findings CTA HEAD FINDINGS Anterior circulation: Mild right paraophthalmic ICA and left cavernous ICA stenosis. Mild left terminal ICA stenosis. No large vessel occlusion, aneurysm, or high-grade stenosis identified. Posterior circulation: Moderate stenosis of proximal basilar artery. Segments of mild stenosis of bilateral P2 segments. No large vessel occlusion, aneurysm, or high-grade stenosis identified. Venous sinuses: As permitted by contrast timing, patent. Anatomic variants: Bilateral fetal PCA and small caliber vertebrobasilar system. Delayed phase: No abnormal intracranial enhancement. Review of the MIP images confirms the above findings IMPRESSION: CT head: 1. No large acute vascular territory infarction,, mass effect, or hemorrhage. 2. Several nonspecific foci of hypoattenuation in white matter including within left corona radiata which may correspond to reported infarctions on outside MRI. CTA neck: 1. Left proximal ICA stable mild less than 50% stenosis with calcified plaque. 2. Left mid common carotid stable moderate 50% stenosis with fibrofatty plaque. 3. Left distal common carotid and proximal ICA stable mild less than 50% stenosis with mixed plaque of the bifurcation. 4. Retrograde flow in the left vertebral artery, uncertain patency of the V1 segment which does not enhance. 5. Left subclavian artery stable moderate 50-70% stenosis with calcified plaque. CTA head: 1. No large vessel occlusion, aneurysm, or high-grade stenosis identified. 2. Intracranial atherosclerosis with multiple areas of mild-to-moderate stenosis in the anterior and posterior circulations. Electronically Signed  By: Kristine Garbe M.D.   On: 11/08/2017 22:40   Ct Angio Neck W Or Wo Contrast  Result  Date: 11/08/2017 CLINICAL DATA:  72 y/o F; stroke patient, follow-up angio study. Outside MRI of the brain with reported subcentimeter infarctions within in the left corona radiata. EXAM: CT ANGIOGRAPHY HEAD AND NECK TECHNIQUE: Multidetector CT imaging of the head and neck was performed using the standard protocol during bolus administration of intravenous contrast. Multiplanar CT image reconstructions and MIPs were obtained to evaluate the vascular anatomy. Carotid stenosis measurements (when applicable) are obtained utilizing NASCET criteria, using the distal internal carotid diameter as the denominator. CONTRAST:  61m ISOVUE-370 IOPAMIDOL (ISOVUE-370) INJECTION 76% COMPARISON:  07/14/2017 CTA of head and neck. FINDINGS: CT HEAD FINDINGS Brain: No large acute vascular territory infarction, hemorrhage, or focal mass effect of the brain. There are few nonspecific foci of hypoattenuation in white matter including the left corona radiata which may correspond to infarctions as reported on outside MRI of the brain. Left parietal paramedian extra-axial mass measuring 28 x 22 x 7 mm (AP x ML x CC series 8, image 39 and series 9, image 31) compatible with meningioma. Vascular: As below. Skull: Normal. Negative for fracture or focal lesion. Sinuses: Imaged portions are clear. Orbits: No acute finding. Review of the MIP images confirms the above findings CTA NECK FINDINGS Aortic arch: Bovine variant branching. Imaged portion shows no evidence of aneurysm or dissection. Left subclavian artery calcified plaque with moderate 50-70% stenosis. Calcific atherosclerosis of aortic arch. Right carotid system: No dissection or aneurysm identified. Calcified plaque of carotid bifurcation and proximal ICA with mild less than 50% proximal ICA stenosis. Left carotid system: No dissection or aneurysm identified. Left common carotid fibrofatty plaque with moderate 50% stenosis (series 12, image 235). Mixed plaque of the carotid bifurcation  with mild less than 50% distal cavernous and proximal ICA stenosis. Vertebral arteries: Codominant. No evidence of dissection, stenosis (50% or greater) or occlusion of the right vertebral artery. The left vertebral artery enhances with increased attenuation superiorly and gradient decreased attenuation of the V1 segment which is poorly enhanced indicating retrograde flow. Skeleton: Moderate cervical spondylosis with discogenic degenerative changes greatest at the C5-C7 levels. No high-grade bony canal stenosis. Other neck: Negative. Upper chest: Negative. Review of the MIP images confirms the above findings CTA HEAD FINDINGS Anterior circulation: Mild right paraophthalmic ICA and left cavernous ICA stenosis. Mild left terminal ICA stenosis. No large vessel occlusion, aneurysm, or high-grade stenosis identified. Posterior circulation: Moderate stenosis of proximal basilar artery. Segments of mild stenosis of bilateral P2 segments. No large vessel occlusion, aneurysm, or high-grade stenosis identified. Venous sinuses: As permitted by contrast timing, patent. Anatomic variants: Bilateral fetal PCA and small caliber vertebrobasilar system. Delayed phase: No abnormal intracranial enhancement. Review of the MIP images confirms the above findings IMPRESSION: CT head: 1. No large acute vascular territory infarction,, mass effect, or hemorrhage. 2. Several nonspecific foci of hypoattenuation in white matter including within left corona radiata which may correspond to reported infarctions on outside MRI. CTA neck: 1. Left proximal ICA stable mild less than 50% stenosis with calcified plaque. 2. Left mid common carotid stable moderate 50% stenosis with fibrofatty plaque. 3. Left distal common carotid and proximal ICA stable mild less than 50% stenosis with mixed plaque of the bifurcation. 4. Retrograde flow in the left vertebral artery, uncertain patency of the V1 segment which does not enhance. 5. Left subclavian artery  stable moderate 50-70% stenosis with calcified plaque. CTA  head: 1. No large vessel occlusion, aneurysm, or high-grade stenosis identified. 2. Intracranial atherosclerosis with multiple areas of mild-to-moderate stenosis in the anterior and posterior circulations. Electronically Signed   By: Kristine Garbe M.D.   On: 11/08/2017 22:40   Dialysis Orders:  TTS at [facility] 4hr, 180dialyzer, 400/A1.5, 82.5kg, 2K/2.25Ca, TDC, profile 4, heparin 3000 + 2000 mid-run - Hectoral 96mg IV q HD - Venofer 553mIV weekly - Mircera 7537mIV q 2 weeks (last 3/21)  Assessment/Plan: 1.  Subacute lacunar CVA: Per neuro. 2.  ESRD: Continue HD per TTS schedule, for HD today (likely will be late this evening). No heparin. 3.  Hypertension/volume: Permissive HTN for now per neuro. Low UF goal. Outpt EDW may need to be raised anyway with Hx recent syncope on dialysis. 4.  Anemia: Hgb 9.8, for ESA later this week. 5.  Metabolic bone disease: Ca ok, continue home binders (phoslo) and VRDA 6.  Type 2 DM 7.  CAD  KatVeneta PentonA-Hershal Coria4/2019, 12:49 PM  [facility] Pager: [phone]

## 2017-11-09 NOTE — Clinical Social Work Note (Signed)
Clinical Social Work Assessment  Patient Details  Name: Kathleen [name] MRN: [id number] Date of Birth: 05-29-1946  Date of referral:  11/09/17               Reason for consult:  Facility Placement                Permission sought to share information with:  Facility Sport and exercise psychologist, Family Supports Permission granted to share information::  Yes, Verbal Permission Granted  Name::        Agency::  SNFs  Relationship::     Contact Information:     Housing/Transportation Living arrangements for the past 2 months:  Single Family Home Source of Information:  Patient Patient Interpreter Needed:  None Criminal Activity/Legal Involvement Pertinent to Current Situation/Hospitalization:  No - Comment as needed Significant Relationships:  Adult Children Lives with:  Self Do you feel safe going back to the place where you live?  No Need for family participation in patient care:  No (Coment)  Care giving concerns:  CSW received consult for possible SNF placement at time of discharge. CSW spoke with patient regarding PT recommendation of SNF placement at time of discharge. Patient reported that at this time she would rather go home but she has been to [facility] before. CSW will check in with patient tomorrow to make sure she hasn't changed her mind. CSW to continue to follow and assist with discharge planning needs.   Social Worker assessment / plan:  CSW spoke with patient concerning possibility of rehab at [facility] before returning home.  Employment status:  Retired Nurse, adult PT Recommendations:  [place] / Referral to community resources:  [place]  Patient/Family's Response to care:  Patient currently expressing with to return home, but did like [facility] the last time she went in December.   Patient/Family's Understanding of and Emotional Response to Diagnosis, Current Treatment, and Prognosis:   Patient/family is realistic regarding therapy needs and expressed being hopeful for return home. Patient expressed understanding of CSW role and discharge process as well as medical condition. No questions/concerns about plan or treatment.    Emotional Assessment Appearance:  Appears stated age Attitude/Demeanor/Rapport:  Gracious Affect (typically observed):  Appropriate, Accepting Orientation:  Oriented to Self, Oriented to Situation, Oriented to Place, Oriented to  Time Alcohol / Substance use:  Not Applicable Psych involvement (Current and /or in the community):  No (Comment)  Discharge Needs  Concerns to be addressed:  Care Coordination Readmission within the last 30 days:  No Current discharge risk:  None Barriers to Discharge:  Continued Medical Work up   [employer], [place] 11/09/2017, 10:49 AM

## 2017-11-10 DIAGNOSIS — D631 Anemia in chronic kidney disease: Secondary | ICD-10-CM

## 2017-11-10 DIAGNOSIS — I639 Cerebral infarction, unspecified: Secondary | ICD-10-CM

## 2017-11-10 DIAGNOSIS — I739 Peripheral vascular disease, unspecified: Secondary | ICD-10-CM

## 2017-11-10 DIAGNOSIS — G9520 Unspecified cord compression: Secondary | ICD-10-CM

## 2017-11-10 DIAGNOSIS — N189 Chronic kidney disease, unspecified: Secondary | ICD-10-CM

## 2017-11-10 DIAGNOSIS — I6339 Cerebral infarction due to thrombosis of other cerebral artery: Secondary | ICD-10-CM

## 2017-11-10 LAB — RENAL FUNCTION PANEL
ALBUMIN: 3.2 g/dL — AB (ref 3.5–5.0)
Anion gap: 11 (ref 5–15)
BUN: 60 mg/dL — AB (ref 6–20)
CO2: 26 mmol/L (ref 22–32)
Calcium: 9.4 mg/dL (ref 8.9–10.3)
Chloride: 96 mmol/L — ABNORMAL LOW (ref 101–111)
Creatinine, Ser: 6 mg/dL — ABNORMAL HIGH (ref 0.44–1.00)
GFR calc Af Amer: 7 mL/min — ABNORMAL LOW (ref >60)
GFR calc non Af Amer: 6 mL/min — ABNORMAL LOW (ref >60)
GLUCOSE: 158 mg/dL — AB (ref 65–99)
PHOSPHORUS: 6.2 mg/dL — AB (ref 2.5–4.6)
Potassium: 4.5 mmol/L (ref 3.5–5.1)
Sodium: 133 mmol/L — ABNORMAL LOW (ref 135–145)

## 2017-11-10 LAB — CBC
HEMATOCRIT: 29.8 % — AB (ref 36.0–46.0)
Hemoglobin: 9.1 g/dL — ABNORMAL LOW (ref 12.0–15.0)
MCH: 30.2 pg (ref 26.0–34.0)
MCHC: 30.5 g/dL (ref 30.0–36.0)
MCV: 99 fL (ref 78.0–100.0)
Platelets: 196 10*3/uL (ref 150–400)
RBC: 3.01 MIL/uL — ABNORMAL LOW (ref 3.87–5.11)
RDW: 15.2 % (ref 11.5–15.5)
WBC: 4.8 10*3/uL (ref 4.0–10.5)

## 2017-11-10 LAB — GLUCOSE, CAPILLARY
Glucose-Capillary: 64 mg/dL — ABNORMAL LOW (ref 65–99)
Glucose-Capillary: 135 mg/dL — ABNORMAL HIGH (ref 65–99)
Glucose-Capillary: 156 mg/dL — ABNORMAL HIGH (ref 65–99)

## 2017-11-10 MED ORDER — CLOPIDOGREL BISULFATE 75 MG PO TABS
75.0000 mg | ORAL_TABLET | Freq: Every day | ORAL | Status: DC
Start: 2017-11-10 — End: 2017-11-11
  Administered 2017-11-10: 75 mg via ORAL
  Filled 2017-11-10: qty 1

## 2017-11-10 NOTE — Progress Notes (Signed)
Occupational Therapy Treatment Patient Details Name: Kathleen [name] MRN: [id number] DOB: [birth year] Today's Date: 11/10/2017    History of present illness 72 yo female with onset of HA and blurred vision for two months received an MRI showing 2 focal corona radiata strokes, sent to ER.  Imaging shows partial B carotid stenosis and atherosclerosis.  PMHx:  meningioma, DM, ESRD, HTN, CAD, CHF   OT comments  Pt progressing well. Performed bed mobility, toileting, standing grooming and ambulated within her room modified independently with RW. Pt plans to return home when medically stable. Recommending HHOT to address IADL.  Follow Up Recommendations  Home health OT    Equipment Recommendations  None recommended by OT    Recommendations for Other Services      Precautions / Restrictions Precautions Precautions: Fall Restrictions Weight Bearing Restrictions: No       Mobility Bed Mobility Overal bed mobility: Modified Independent             General bed mobility comments: HOB up, increased time  Transfers Overall transfer level: Modified independent               General transfer comment: from bed and 3in 1 over toilet    Balance Overall balance assessment: Needs assistance   Sitting balance-Leahy Scale: Good       Standing balance-Leahy Scale: Fair Standing balance comment: at sink                           ADL either performed or assessed with clinical judgement   ADL Overall ADL's : Needs assistance/impaired Eating/Feeding: Independent;Sitting   Grooming: Wash/dry hands;Standing;Modified independent           Upper Body Dressing : Set up;Sitting       Toilet Transfer: Supervision/safety;Ambulation;RW;BSC(over toilet)   Toileting- Clothing Manipulation and Hygiene: Supervision/safety;Sitting/lateral lean       Functional mobility during ADLs: Supervision/safety;Rolling walker       Vision       Perception     Praxis       Cognition Arousal/Alertness: Awake/alert Behavior During Therapy: WFL for tasks assessed/performed Overall Cognitive Status: Within Functional Limits for tasks assessed                                          Exercises     Shoulder Instructions       General Comments      Pertinent Vitals/ Pain       Pain Assessment: No/denies pain  Home Living                                          Prior Functioning/Environment              Frequency  Min 2X/week        Progress Toward Goals  OT Goals(current goals can now be found in the care plan section)  Progress towards OT goals: Goals met/education completed, patient discharged from OT  Acute Rehab OT Goals Patient Stated Goal: to get stronger and get home OT Goal Formulation: With patient Time For Goal Achievement: 11/23/17 Potential to Achieve Goals: Good  Plan Discharge plan remains appropriate    Co-evaluation  AM-PAC PT "6 Clicks" Daily Activity     Outcome Measure   Help from another person eating meals?: None Help from another person taking care of personal grooming?: None Help from another person toileting, which includes using toliet, bedpan, or urinal?: None Help from another person bathing (including washing, rinsing, drying)?: None Help from another person to put on and taking off regular upper body clothing?: None Help from another person to put on and taking off regular lower body clothing?: None 6 Click Score: 24    End of Session Equipment Utilized During Treatment: Gait belt;Rolling walker  OT Visit Diagnosis: Other symptoms and signs involving the nervous system (R29.898);Unsteadiness on feet (R26.81)   Activity Tolerance Patient tolerated treatment well   Patient Left in chair;with call bell/phone within reach;with chair alarm set   Nurse Communication          Time: 7078-6754 OT Time Calculation (min): 17 min  Charges:  OT General Charges $OT Visit: 1 Visit OT Treatments $Self Care/Home Management : 8-22 mins  11/10/2017 Nestor Lewandowsky, OTR/L Pager: [phone] Werner Lean, Haze Boyden 11/10/2017, 2:53 PM

## 2017-11-10 NOTE — Progress Notes (Signed)
OT Cancellation Note  Patient Details Name: Kathleen [name] MRN: [id number] DOB: 1946/01/01   Cancelled Treatment:    Reason Eval/Treat Not Completed: Patient at procedure or test/ unavailable(Currently in HD)  [name] 11/10/2017, 9:27 AM

## 2017-11-10 NOTE — Progress Notes (Signed)
Received a call from Dr Audelia Acton (out pt Neurologist) in regards to a C spine MRI showing cord compression done at [facility] 2 days ago. He was away from his office and discovered this result today. He would like NS to see her in the hospital. I have consulted NS. He will try to obtain the official report which is not in care everywhere.   [name], MD

## 2017-11-10 NOTE — Progress Notes (Signed)
Stroke Team Progress Note      SUBJECTIVE Patient states that her headache is improving but still present. She denies any episodes of transient vision loss, scalp claudication or tenderness, jaw claudication or significant muscle aches and pains. to suggest temporal arteritis.  OBJECTIVE Most recent Vital Signs: Temp: 98.4 F (36.9 C) (04/05 1143) Temp Source: Oral (04/05 1143) BP: 147/70 (04/05 1300) Pulse Rate: 72 (04/05 1143) Respiratory Rate: 16 O2 Saturdation: 98%  CBG (last 3)  Recent Labs    11/09/17 1715 11/09/17 2142 11/10/17 1219  GLUCAP 133* 146* 64*    Diet: Fall precautions Fall precautions Diet renal/carb modified with fluid restriction Diet-HS Snack? Nothing; Fluid restriction: 1200 mL Fluid; Room service appropriate? Yes; Fluid consistency: Thin   liquids  Activity: Up with assistance   VTE Prophylaxis:   scds Studies: Results for orders placed or performed during the hospital encounter of 11/08/17 (from the past 24 hour(s))  Glucose, capillary     Status: Abnormal   Collection Time: 11/09/17  5:15 PM  Result Value Ref Range   Glucose-Capillary 133 (H) 65 - 99 mg/dL  Sedimentation rate     Status: Abnormal   Collection Time: 11/09/17  8:26 PM  Result Value Ref Range   Sed Rate 53 (H) 0 - 22 mm/hr  Glucose, capillary     Status: Abnormal   Collection Time: 11/09/17  9:42 PM  Result Value Ref Range   Glucose-Capillary 146 (H) 65 - 99 mg/dL  CBC     Status: Abnormal   Collection Time: 11/10/17  8:17 AM  Result Value Ref Range   WBC 4.8 4.0 - 10.5 K/uL   RBC 3.01 (L) 3.87 - 5.11 MIL/uL   Hemoglobin 9.1 (L) 12.0 - 15.0 g/dL   HCT 29.8 (L) 36.0 - 46.0 %   MCV 99.0 78.0 - 100.0 fL   MCH 30.2 26.0 - 34.0 pg   MCHC 30.5 30.0 - 36.0 g/dL   RDW 15.2 11.5 - 15.5 %   Platelets 196 150 - 400 K/uL  Renal function panel     Status: Abnormal   Collection Time: 11/10/17  8:17 AM  Result Value Ref Range   Sodium 133 (L) 135 - 145 mmol/L   Potassium 4.5 3.5  - 5.1 mmol/L   Chloride 96 (L) 101 - 111 mmol/L   CO2 26 22 - 32 mmol/L   Glucose, Bld 158 (H) 65 - 99 mg/dL   BUN 60 (H) 6 - 20 mg/dL   Creatinine, Ser 6.00 (H) 0.44 - 1.00 mg/dL   Calcium 9.4 8.9 - 10.3 mg/dL   Phosphorus 6.2 (H) 2.5 - 4.6 mg/dL   Albumin 3.2 (L) 3.5 - 5.0 g/dL   GFR calc non Af Amer 6 (L) >60 mL/min   GFR calc Af Amer 7 (L) >60 mL/min   Anion gap 11 5 - 15  Glucose, capillary     Status: Abnormal   Collection Time: 11/10/17 12:19 PM  Result Value Ref Range   Glucose-Capillary 64 (L) 65 - 99 mg/dL     Ct Angio Head W Or Wo Contrast  Result Date: 11/08/2017 CLINICAL DATA:  72 y/o F; stroke patient, follow-up angio study. Outside MRI of the brain with reported subcentimeter infarctions within in the left corona radiata. EXAM: CT ANGIOGRAPHY HEAD AND NECK TECHNIQUE: Multidetector CT imaging of the head and neck was performed using the standard protocol during bolus administration of intravenous contrast. Multiplanar CT image reconstructions and MIPs were obtained to evaluate  the vascular anatomy. Carotid stenosis measurements (when applicable) are obtained utilizing NASCET criteria, using the distal internal carotid diameter as the denominator. CONTRAST:  11m ISOVUE-370 IOPAMIDOL (ISOVUE-370) INJECTION 76% COMPARISON:  07/14/2017 CTA of head and neck. FINDINGS: CT HEAD FINDINGS Brain: No large acute vascular territory infarction, hemorrhage, or focal mass effect of the brain. There are few nonspecific foci of hypoattenuation in white matter including the left corona radiata which may correspond to infarctions as reported on outside MRI of the brain. Left parietal paramedian extra-axial mass measuring 28 x 22 x 7 mm (AP x ML x CC series 8, image 39 and series 9, image 31) compatible with meningioma. Vascular: As below. Skull: Normal. Negative for fracture or focal lesion. Sinuses: Imaged portions are clear. Orbits: No acute finding. Review of the MIP images confirms the above  findings CTA NECK FINDINGS Aortic arch: Bovine variant branching. Imaged portion shows no evidence of aneurysm or dissection. Left subclavian artery calcified plaque with moderate 50-70% stenosis. Calcific atherosclerosis of aortic arch. Right carotid system: No dissection or aneurysm identified. Calcified plaque of carotid bifurcation and proximal ICA with mild less than 50% proximal ICA stenosis. Left carotid system: No dissection or aneurysm identified. Left common carotid fibrofatty plaque with moderate 50% stenosis (series 12, image 235). Mixed plaque of the carotid bifurcation with mild less than 50% distal cavernous and proximal ICA stenosis. Vertebral arteries: Codominant. No evidence of dissection, stenosis (50% or greater) or occlusion of the right vertebral artery. The left vertebral artery enhances with increased attenuation superiorly and gradient decreased attenuation of the V1 segment which is poorly enhanced indicating retrograde flow. Skeleton: Moderate cervical spondylosis with discogenic degenerative changes greatest at the C5-C7 levels. No high-grade bony canal stenosis. Other neck: Negative. Upper chest: Negative. Review of the MIP images confirms the above findings CTA HEAD FINDINGS Anterior circulation: Mild right paraophthalmic ICA and left cavernous ICA stenosis. Mild left terminal ICA stenosis. No large vessel occlusion, aneurysm, or high-grade stenosis identified. Posterior circulation: Moderate stenosis of proximal basilar artery. Segments of mild stenosis of bilateral P2 segments. No large vessel occlusion, aneurysm, or high-grade stenosis identified. Venous sinuses: As permitted by contrast timing, patent. Anatomic variants: Bilateral fetal PCA and small caliber vertebrobasilar system. Delayed phase: No abnormal intracranial enhancement. Review of the MIP images confirms the above findings IMPRESSION: CT head: 1. No large acute vascular territory infarction,, mass effect, or hemorrhage.  2. Several nonspecific foci of hypoattenuation in white matter including within left corona radiata which may correspond to reported infarctions on outside MRI. CTA neck: 1. Left proximal ICA stable mild less than 50% stenosis with calcified plaque. 2. Left mid common carotid stable moderate 50% stenosis with fibrofatty plaque. 3. Left distal common carotid and proximal ICA stable mild less than 50% stenosis with mixed plaque of the bifurcation. 4. Retrograde flow in the left vertebral artery, uncertain patency of the V1 segment which does not enhance. 5. Left subclavian artery stable moderate 50-70% stenosis with calcified plaque. CTA head: 1. No large vessel occlusion, aneurysm, or high-grade stenosis identified. 2. Intracranial atherosclerosis with multiple areas of mild-to-moderate stenosis in the anterior and posterior circulations. Electronically Signed   By: LKristine GarbeM.D.   On: 11/08/2017 22:40   Ct Angio Neck W Or Wo Contrast  Result Date: 11/08/2017 CLINICAL DATA:  72y/o F; stroke patient, follow-up angio study. Outside MRI of the brain with reported subcentimeter infarctions within in the left corona radiata. EXAM: CT ANGIOGRAPHY HEAD AND NECK TECHNIQUE: Multidetector  CT imaging of the head and neck was performed using the standard protocol during bolus administration of intravenous contrast. Multiplanar CT image reconstructions and MIPs were obtained to evaluate the vascular anatomy. Carotid stenosis measurements (when applicable) are obtained utilizing NASCET criteria, using the distal internal carotid diameter as the denominator. CONTRAST:  13m ISOVUE-370 IOPAMIDOL (ISOVUE-370) INJECTION 76% COMPARISON:  07/14/2017 CTA of head and neck. FINDINGS: CT HEAD FINDINGS Brain: No large acute vascular territory infarction, hemorrhage, or focal mass effect of the brain. There are few nonspecific foci of hypoattenuation in white matter including the left corona radiata which may correspond to  infarctions as reported on outside MRI of the brain. Left parietal paramedian extra-axial mass measuring 28 x 22 x 7 mm (AP x ML x CC series 8, image 39 and series 9, image 31) compatible with meningioma. Vascular: As below. Skull: Normal. Negative for fracture or focal lesion. Sinuses: Imaged portions are clear. Orbits: No acute finding. Review of the MIP images confirms the above findings CTA NECK FINDINGS Aortic arch: Bovine variant branching. Imaged portion shows no evidence of aneurysm or dissection. Left subclavian artery calcified plaque with moderate 50-70% stenosis. Calcific atherosclerosis of aortic arch. Right carotid system: No dissection or aneurysm identified. Calcified plaque of carotid bifurcation and proximal ICA with mild less than 50% proximal ICA stenosis. Left carotid system: No dissection or aneurysm identified. Left common carotid fibrofatty plaque with moderate 50% stenosis (series 12, image 235). Mixed plaque of the carotid bifurcation with mild less than 50% distal cavernous and proximal ICA stenosis. Vertebral arteries: Codominant. No evidence of dissection, stenosis (50% or greater) or occlusion of the right vertebral artery. The left vertebral artery enhances with increased attenuation superiorly and gradient decreased attenuation of the V1 segment which is poorly enhanced indicating retrograde flow. Skeleton: Moderate cervical spondylosis with discogenic degenerative changes greatest at the C5-C7 levels. No high-grade bony canal stenosis. Other neck: Negative. Upper chest: Negative. Review of the MIP images confirms the above findings CTA HEAD FINDINGS Anterior circulation: Mild right paraophthalmic ICA and left cavernous ICA stenosis. Mild left terminal ICA stenosis. No large vessel occlusion, aneurysm, or high-grade stenosis identified. Posterior circulation: Moderate stenosis of proximal basilar artery. Segments of mild stenosis of bilateral P2 segments. No large vessel occlusion,  aneurysm, or high-grade stenosis identified. Venous sinuses: As permitted by contrast timing, patent. Anatomic variants: Bilateral fetal PCA and small caliber vertebrobasilar system. Delayed phase: No abnormal intracranial enhancement. Review of the MIP images confirms the above findings IMPRESSION: CT head: 1. No large acute vascular territory infarction,, mass effect, or hemorrhage. 2. Several nonspecific foci of hypoattenuation in white matter including within left corona radiata which may correspond to reported infarctions on outside MRI. CTA neck: 1. Left proximal ICA stable mild less than 50% stenosis with calcified plaque. 2. Left mid common carotid stable moderate 50% stenosis with fibrofatty plaque. 3. Left distal common carotid and proximal ICA stable mild less than 50% stenosis with mixed plaque of the bifurcation. 4. Retrograde flow in the left vertebral artery, uncertain patency of the V1 segment which does not enhance. 5. Left subclavian artery stable moderate 50-70% stenosis with calcified plaque. CTA head: 1. No large vessel occlusion, aneurysm, or high-grade stenosis identified. 2. Intracranial atherosclerosis with multiple areas of mild-to-moderate stenosis in the anterior and posterior circulations. Electronically Signed   By: LKristine GarbeM.D.   On: 11/08/2017 22:40   ESR 53 mm  Physical Exam:    Obese elderly African-American lady not in distress.Both  radial pulses not felt. She has AV fistula for dialysis in the left arm. . Afebrile. Head is nontraumatic. Neck is supple without bruit.    Cardiac exam no murmur or gallop. Lungs are clear to auscultation.   Neurological Exam ;  Awake  Alert oriented x 3. Normal speech and language.eye movements full without nystagmus.fundi were not visualized. Vision acuity and fields appear normal. Hearing is normal. Palatal movements are normal. Face symmetric. Tongue midline. Normal strength, tone, reflexes and coordination. Subjective  diminished touch and pinprick sensation in the left hand and left lower extremity only.l sensation. Gait deferred. . ASSESSMENT Ms. Kathleen [name] is a 72 y.o. female with  Silent left brain subcortical lacunar infarcts likely due to small vessel disease. MRI scan also shows small left vertex meningioma which is incidental. Neurovascular imaging otherwise is unremarkable Hospital day # 1  TREATMENT/PLAN  She has presented with nonspecific headaches and brain MRI scan shows a likely clinically silent tiny left subcortical lacunar infarct from small vessel disease.   She has a small incidental convexity meningioma which is not the cause of her symptoms and should be managed conservatively. Long discussion with the patient regarding her clinical presentation is consistent results of imaging studies and plan for evaluation and answered questions. Discussed with Dr Wynelle Cleveland  Patient was advised to follow-up with her local neurologist and have repeat ESR and workup for her chronic headaches as well as follow-up for her meningioma. She was understanding. Stroke team will sign off. Kindly call for questions. Antony Contras, MD Medical Director [place] Pager: [phone] 11/10/2017 4:37 PM   Antony Contras, MD [facility] Pager: [phone] 11/10/2017 4:37 PM

## 2017-11-10 NOTE — Progress Notes (Signed)
CSW spoke with patient again regarding discharge plan. Patient again stated she wanted to return home at discharge. CSW asked patient if she was sure because her insurance would not be open to authorize her to go to snf on the weekend. Patient stated that she lives with her son and her daughter is next door. Her ex-husband takes her to dialysis. CSW made RNCM aware of home health needs.  CSW signing off.  Percell Locus Shiloh Swopes LCSW [phone]

## 2017-11-10 NOTE — Plan of Care (Signed)
Progressing

## 2017-11-10 NOTE — Progress Notes (Signed)
[facility] Progress Note  Subjective: no c/o, on HD.    Vitals:   11/10/17 1000 11/10/17 1030 11/10/17 1100 11/10/17 1143  BP:    (!) (P) 131/56  Pulse: 72 76 (P) 69 (P) 72  Resp:    (P) 16  Temp:    (P) 98.4 F (36.9 C)  TempSrc:    (P) Oral  SpO2:    (P) 98%  Weight:      Height:        Inpatient medications: .  stroke: mapping our early stages of recovery book   Does not apply Once  . atorvastatin  40 mg Oral q1800  . calcium acetate  2,001 mg Oral TID WC  . clopidogrel  75 mg Oral Daily  . [START ON 11/11/2017] doxercalciferol  4 mcg Intravenous Q T,Th,Sa-HD  . gabapentin  100 mg Oral QHS  . heparin  5,000 Units Subcutaneous Q8H  . insulin aspart  0-9 Units Subcutaneous TID WC  . insulin detemir  15 Units Subcutaneous QHS  . multivitamin  1 tablet Oral QHS    acetaminophen **OR** acetaminophen (TYLENOL) oral liquid 160 mg/5 mL **OR** acetaminophen, brimonidine **AND** timolol, calcium acetate, HYDROcodone-acetaminophen  Exam: General: Well developed, well nourished, in no acute distress. Neck:  JVD not elevated. Lungs: Clear bilaterally to auscultation  Heart: RRR with normal S1, S2. Abdomen: Soft, non-tender, non-distended with normoactive bowel sounds Lower extremities: No edema or ischemic changes, no open wounds. Neuro: Alert and oriented X 3. Moves all extremities spontaneously. Dialysis Access: LUE AVF + thrill (moderate bruising), R TDC without erythema    Dialysis: [facility]h  400/A1.5   82.5kg  (raising dry wt to 86kg approx)  2/2.25 bath  TDC  P4  Hep 3000 then 2000 midrun - Hectoral 65mcg IV q HD - Venofer 50mg  IV weekly - Mircera 58mcg IV q 2 weeks (last 3/21)  Assessment/Plan: 1.  Subacute lacunar CVA: pt had post HA"S and dizziness, saw PCP and outpt MRI showed 2 small lacunar CVA's involving the L corona radiata.  No focal deficits. Per neuro plan is complete w/u w echo/ carotids, chg ASA > plavix, other rec's.  2.  ESRD: missed  HD yest d/t scheduling, on HD now. Plan HD Sat if still here 3.  Hypertension/volume: Permissive HTN for now per neuro. Low UF goal. Holding BP meds x 2.  EDW will be raised anyway - she recently had a recent syncopal episode on dialysis and today is having sig low BP issues at 86kg which is 3.5kg over   4.  Anemia: Hgb 9.8, for ESA later this week. 5.  Metabolic bone disease: Ca ok, continue home binders (phoslo) and VRDA 6.  Type 2 DM 7.  CAD  [name] MD [facility] pager [phone]   11/10/2017, 11:48 AM   Recent Labs  Lab 11/08/17 1648 11/08/17 1659 11/09/17 0329 11/10/17 0817  NA 137 134*  --  133*  K 4.3 4.2  --  4.5  CL 96* 95*  --  96*  CO2  --  27  --  26  GLUCOSE 361* 358*  --  158*  BUN 35* 34*  --  60*  CREATININE 4.00* 4.02* 4.59* 6.00*  CALCIUM  --  8.8*  --  9.4  PHOS  --   --   --  6.2*   Recent Labs  Lab 11/08/17 1659 11/10/17 0817  AST 19  --   ALT 8*  --  ALKPHOS 82  --   BILITOT 0.7  --   PROT 6.6  --   ALBUMIN 3.6 3.2*   Recent Labs  Lab 11/08/17 1659 11/09/17 0329 11/10/17 0817  WBC 4.7 4.4 4.8  NEUTROABS 2.4  --   --   HGB 10.0* 9.8* 9.1*  HCT 31.9* 31.3* 29.8*  MCV 100.9* 100.6* 99.0  PLT 181 175 196   Iron/TIBC/Ferritin/ %Sat    Component Value Date/Time   IRON 30 02/14/2017 1030   TIBC 216 (L) 02/14/2017 1030   FERRITIN 101 02/14/2017 1030   IRONPCTSAT 14 02/14/2017 1030

## 2017-11-10 NOTE — Progress Notes (Signed)
PROGRESS NOTE    Kathleen [name]   ASN:[id number]  DOB: [birth year]  DOA: 11/08/2017 PCP: Benito Mccreedy, MD   Brief Narrative:  Kathleen [name] is a 72 y.o. female with history of ESRD on hemodialysis, IIDM2, HTN,  anemia, hyperlipidemia was referred to the ER as an outpt MRI showed a stroke   Patient has been having headache mostly in the occipital area with at times dizziness with blurred vision over the last 2 months.  Patient's primary care physician ordered MRI of the brain which showed 2 small lacunar infarct involving the left corona radiata.   Subjective: Sleeping on dialysis. Did not talk much to me. Spoke with her about PT recommendations for SNF and she stated she wanted to go home. She had no complaints.    Assessment & Plan:   Principal Problem:   Acute CVA (cerebrovascular accident) - Neuro following - CT head: few nonspecific foci of hypoattenuation in white matter including the left corona radiata which may correspond to infarctions as reported on outside MRI of the brain. CTA neck: 1. Left proximal ICA  less than 50% stenosis . 2. Left mid common carotid  moderate 50% stenosis w  3. Left distal common carotid and proximal ICA stable mild less than 50% stenosis w  4. Retrograde flow in the left vertebral artery, uncertain patency of the V1 segment which does not enhance. 5. Left subclavian artery stable moderate 50-70% stenosis   CTA head:  mild-to-moderate stenosis in the anterior and posterior circulations. - 2 D ECHO> no stenosis - 40 mg Lipitor, aspirin 325 (takes 81 at home),  stroke team  Has switched her to Plavix   Active Problems:  Cervical Cord Compression - I was called by her primary neurologist, Dr Audelia Acton, in regards to a C spine MRI showing cord compression done at [facility] 2 days ago. - he has noted worsening gait and LE hyperreflexia and recommended a NS Eval - I called the consult in to NS this AM and left mine and Dr Orson Gear cell  phone numbers - she walked with PT with a walker yesterday- she refuses SNF at this point   Meningioma CT>  Left parietal paramedian extra-axial mass measuring 28 x 22 x 7 mm compatible with meningioma.  HTN - holding Coreg and Hydralazine due to CVA- will resume Coreg this evening    ESRD on dialysis - appreciate neprho f/u - dialyzed today- next dialysis Sat    Chronic combined systolic and diastolic heart failure  - fluid management with dialysis     DM (diabetes mellitus), type 2 with renal complications (HCC) - Levemir at home dose and SSI  DVT prophylaxis: Heparin Code Status: full code Family Communication:  Disposition Plan: await NS eval- possibly SNF Consultants:   Neurology  Nephrology   NS Procedures:   2 D ECHO Study Conclusions  - Left ventricle: The cavity size was normal. There was moderate   concentric hypertrophy. Systolic function was normal. The   estimated ejection fraction was in the range of 60% to 65%. Wall   motion was normal; there were no regional wall motion   abnormalities. There was an increased relative contribution of   atrial contraction to ventricular filling. Doppler parameters are   consistent with abnormal left ventricular relaxation (grade 1   diastolic dysfunction). Doppler parameters are consistent with   high ventricular filling pressure. - Mitral valve: Calcified annulus. - Tricuspid valve: There was trivial regurgitation. Antimicrobials:  Anti-infectives (From admission, onward)  None       Objective: Vitals:   11/10/17 1030 11/10/17 1100 11/10/17 1143 11/10/17 1300  BP:   (!) 131/56 (!) 147/70  Pulse: 76 (P) 69 72   Resp:   16 16  Temp:   98.4 F (36.9 C)   TempSrc:   Oral   SpO2:   98%   Weight:      Height:        Intake/Output Summary (Last 24 hours) at 11/10/2017 1457 Last data filed at 11/10/2017 1249 Gross per 24 hour  Intake 460 ml  Output 637 ml  Net -177 ml   Filed Weights   11/09/17 0357  11/10/17 0211 11/10/17 0732  Weight: 85 kg (187 lb 6.3 oz) 84.9 kg (187 lb 1.6 oz) 86.1 kg (189 lb 13.1 oz)    Examination: General exam: Appears comfortable  HEENT: PERRLA, oral mucosa moist, no sclera icterus or thrush Respiratory system: Clear to auscultation. Respiratory effort normal. Cardiovascular system: S1 & S2 heard,  No murmurs  Gastrointestinal system: Abdomen soft, non-tender, nondistended. Normal bowel sound. No organomegaly Central nervous system: Alert and oriented. No focal neurological deficits. Extremities: No cyanosis, clubbing or edema Skin: No rashes or ulcers Psychiatry:  Mood & affect appropriate.       Data Reviewed: I have personally reviewed following labs and imaging studies  CBC: Recent Labs  Lab 11/08/17 1648 11/08/17 1659 11/09/17 0329 11/10/17 0817  WBC  --  4.7 4.4 4.8  NEUTROABS  --  2.4  --   --   HGB 10.5* 10.0* 9.8* 9.1*  HCT 31.0* 31.9* 31.3* 29.8*  MCV  --  100.9* 100.6* 99.0  PLT  --  181 175 924   Basic Metabolic Panel: Recent Labs  Lab 11/08/17 1648 11/08/17 1659 11/09/17 0329 11/10/17 0817  NA 137 134*  --  133*  K 4.3 4.2  --  4.5  CL 96* 95*  --  96*  CO2  --  27  --  26  GLUCOSE 361* 358*  --  158*  BUN 35* 34*  --  60*  CREATININE 4.00* 4.02* 4.59* 6.00*  CALCIUM  --  8.8*  --  9.4  PHOS  --   --   --  6.2*   GFR: Estimated Creatinine Clearance: 8.7 mL/min (A) (by C-G formula based on SCr of 6 mg/dL (H)). Liver Function Tests: Recent Labs  Lab 11/08/17 1659 11/10/17 0817  AST 19  --   ALT 8*  --   ALKPHOS 82  --   BILITOT 0.7  --   PROT 6.6  --   ALBUMIN 3.6 3.2*   No results for input(s): LIPASE, AMYLASE in the last 168 hours. No results for input(s): AMMONIA in the last 168 hours. Coagulation Profile: Recent Labs  Lab 11/08/17 1659  INR 1.11   Cardiac Enzymes: No results for input(s): CKTOTAL, CKMB, CKMBINDEX, TROPONINI in the last 168 hours. BNP (last 3 results) No results for input(s):  PROBNP in the last 8760 hours. HbA1C: Recent Labs    11/09/17 0329  HGBA1C 7.5*   CBG: Recent Labs  Lab 11/09/17 0803 11/09/17 1232 11/09/17 1715 11/09/17 2142 11/10/17 1219  GLUCAP 85 101* 133* 146* 64*   Lipid Profile: Recent Labs    11/09/17 0329  CHOL 234*  HDL 62  LDLCALC 154*  TRIG 88  CHOLHDL 3.8   Thyroid Function Tests: No results for input(s): TSH, T4TOTAL, FREET4, T3FREE, THYROIDAB in the last 72 hours. Anemia Panel:  No results for input(s): VITAMINB12, FOLATE, FERRITIN, TIBC, IRON, RETICCTPCT in the last 72 hours. Urine analysis:    Component Value Date/Time   COLORURINE YELLOW 07/13/2017 2335   APPEARANCEUR CLEAR 07/13/2017 2335   LABSPEC 1.009 07/13/2017 2335   PHURINE 6.0 07/13/2017 2335   GLUCOSEU NEGATIVE 07/13/2017 2335   HGBUR NEGATIVE 07/13/2017 2335   BILIRUBINUR NEGATIVE 07/13/2017 2335   [place] NEGATIVE 07/13/2017 2335   PROTEINUR 100 (A) 07/13/2017 2335   UROBILINOGEN 0.2 06/11/2014 0639   NITRITE NEGATIVE 07/13/2017 2335   LEUKOCYTESUR NEGATIVE 07/13/2017 2335   Sepsis Labs: @LABRCNTIP (procalcitonin:4,lacticidven:4) ) Recent Results (from the past 240 hour(s))  MRSA PCR Screening     Status: None   Collection Time: 11/09/17  8:34 AM  Result Value Ref Range Status   MRSA by PCR NEGATIVE NEGATIVE Final    Comment:        The GeneXpert MRSA Assay (FDA approved for NASAL specimens only), is one component of a comprehensive MRSA colonization surveillance program. It is not intended to diagnose MRSA infection nor to guide or monitor treatment for MRSA infections. Performed at [facility], [place] [address], [place], [place] [address]          Radiology Studies: Ct Angio Head W Or Wo Contrast  Result Date: 11/08/2017 CLINICAL DATA:  72 y/o F; stroke patient, follow-up angio study. Outside MRI of the brain with reported subcentimeter infarctions within in the left corona radiata. EXAM: CT ANGIOGRAPHY HEAD AND NECK  TECHNIQUE: Multidetector CT imaging of the head and neck was performed using the standard protocol during bolus administration of intravenous contrast. Multiplanar CT image reconstructions and MIPs were obtained to evaluate the vascular anatomy. Carotid stenosis measurements (when applicable) are obtained utilizing NASCET criteria, using the distal internal carotid diameter as the denominator. CONTRAST:  47mL ISOVUE-370 IOPAMIDOL (ISOVUE-370) INJECTION 76% COMPARISON:  07/14/2017 CTA of head and neck. FINDINGS: CT HEAD FINDINGS Brain: No large acute vascular territory infarction, hemorrhage, or focal mass effect of the brain. There are few nonspecific foci of hypoattenuation in white matter including the left corona radiata which may correspond to infarctions as reported on outside MRI of the brain. Left parietal paramedian extra-axial mass measuring 28 x 22 x 7 mm (AP x ML x CC series 8, image 39 and series 9, image 31) compatible with meningioma. Vascular: As below. Skull: Normal. Negative for fracture or focal lesion. Sinuses: Imaged portions are clear. Orbits: No acute finding. Review of the MIP images confirms the above findings CTA NECK FINDINGS Aortic arch: Bovine variant branching. Imaged portion shows no evidence of aneurysm or dissection. Left subclavian artery calcified plaque with moderate 50-70% stenosis. Calcific atherosclerosis of aortic arch. Right carotid system: No dissection or aneurysm identified. Calcified plaque of carotid bifurcation and proximal ICA with mild less than 50% proximal ICA stenosis. Left carotid system: No dissection or aneurysm identified. Left common carotid fibrofatty plaque with moderate 50% stenosis (series 12, image 235). Mixed plaque of the carotid bifurcation with mild less than 50% distal cavernous and proximal ICA stenosis. Vertebral arteries: Codominant. No evidence of dissection, stenosis (50% or greater) or occlusion of the right vertebral artery. The left vertebral  artery enhances with increased attenuation superiorly and gradient decreased attenuation of the V1 segment which is poorly enhanced indicating retrograde flow. Skeleton: Moderate cervical spondylosis with discogenic degenerative changes greatest at the C5-C7 levels. No high-grade bony canal stenosis. Other neck: Negative. Upper chest: Negative. Review of the MIP images confirms the above findings  CTA HEAD FINDINGS Anterior circulation: Mild right paraophthalmic ICA and left cavernous ICA stenosis. Mild left terminal ICA stenosis. No large vessel occlusion, aneurysm, or high-grade stenosis identified. Posterior circulation: Moderate stenosis of proximal basilar artery. Segments of mild stenosis of bilateral P2 segments. No large vessel occlusion, aneurysm, or high-grade stenosis identified. Venous sinuses: As permitted by contrast timing, patent. Anatomic variants: Bilateral fetal PCA and small caliber vertebrobasilar system. Delayed phase: No abnormal intracranial enhancement. Review of the MIP images confirms the above findings IMPRESSION: CT head: 1. No large acute vascular territory infarction,, mass effect, or hemorrhage. 2. Several nonspecific foci of hypoattenuation in white matter including within left corona radiata which may correspond to reported infarctions on outside MRI. CTA neck: 1. Left proximal ICA stable mild less than 50% stenosis with calcified plaque. 2. Left mid common carotid stable moderate 50% stenosis with fibrofatty plaque. 3. Left distal common carotid and proximal ICA stable mild less than 50% stenosis with mixed plaque of the bifurcation. 4. Retrograde flow in the left vertebral artery, uncertain patency of the V1 segment which does not enhance. 5. Left subclavian artery stable moderate 50-70% stenosis with calcified plaque. CTA head: 1. No large vessel occlusion, aneurysm, or high-grade stenosis identified. 2. Intracranial atherosclerosis with multiple areas of mild-to-moderate stenosis  in the anterior and posterior circulations. Electronically Signed   By: Kristine Garbe M.D.   On: 11/08/2017 22:40   Ct Angio Neck W Or Wo Contrast  Result Date: 11/08/2017 CLINICAL DATA:  72 y/o F; stroke patient, follow-up angio study. Outside MRI of the brain with reported subcentimeter infarctions within in the left corona radiata. EXAM: CT ANGIOGRAPHY HEAD AND NECK TECHNIQUE: Multidetector CT imaging of the head and neck was performed using the standard protocol during bolus administration of intravenous contrast. Multiplanar CT image reconstructions and MIPs were obtained to evaluate the vascular anatomy. Carotid stenosis measurements (when applicable) are obtained utilizing NASCET criteria, using the distal internal carotid diameter as the denominator. CONTRAST:  37mL ISOVUE-370 IOPAMIDOL (ISOVUE-370) INJECTION 76% COMPARISON:  07/14/2017 CTA of head and neck. FINDINGS: CT HEAD FINDINGS Brain: No large acute vascular territory infarction, hemorrhage, or focal mass effect of the brain. There are few nonspecific foci of hypoattenuation in white matter including the left corona radiata which may correspond to infarctions as reported on outside MRI of the brain. Left parietal paramedian extra-axial mass measuring 28 x 22 x 7 mm (AP x ML x CC series 8, image 39 and series 9, image 31) compatible with meningioma. Vascular: As below. Skull: Normal. Negative for fracture or focal lesion. Sinuses: Imaged portions are clear. Orbits: No acute finding. Review of the MIP images confirms the above findings CTA NECK FINDINGS Aortic arch: Bovine variant branching. Imaged portion shows no evidence of aneurysm or dissection. Left subclavian artery calcified plaque with moderate 50-70% stenosis. Calcific atherosclerosis of aortic arch. Right carotid system: No dissection or aneurysm identified. Calcified plaque of carotid bifurcation and proximal ICA with mild less than 50% proximal ICA stenosis. Left carotid  system: No dissection or aneurysm identified. Left common carotid fibrofatty plaque with moderate 50% stenosis (series 12, image 235). Mixed plaque of the carotid bifurcation with mild less than 50% distal cavernous and proximal ICA stenosis. Vertebral arteries: Codominant. No evidence of dissection, stenosis (50% or greater) or occlusion of the right vertebral artery. The left vertebral artery enhances with increased attenuation superiorly and gradient decreased attenuation of the V1 segment which is poorly enhanced indicating retrograde flow. Skeleton: Moderate cervical spondylosis  with discogenic degenerative changes greatest at the C5-C7 levels. No high-grade bony canal stenosis. Other neck: Negative. Upper chest: Negative. Review of the MIP images confirms the above findings CTA HEAD FINDINGS Anterior circulation: Mild right paraophthalmic ICA and left cavernous ICA stenosis. Mild left terminal ICA stenosis. No large vessel occlusion, aneurysm, or high-grade stenosis identified. Posterior circulation: Moderate stenosis of proximal basilar artery. Segments of mild stenosis of bilateral P2 segments. No large vessel occlusion, aneurysm, or high-grade stenosis identified. Venous sinuses: As permitted by contrast timing, patent. Anatomic variants: Bilateral fetal PCA and small caliber vertebrobasilar system. Delayed phase: No abnormal intracranial enhancement. Review of the MIP images confirms the above findings IMPRESSION: CT head: 1. No large acute vascular territory infarction,, mass effect, or hemorrhage. 2. Several nonspecific foci of hypoattenuation in white matter including within left corona radiata which may correspond to reported infarctions on outside MRI. CTA neck: 1. Left proximal ICA stable mild less than 50% stenosis with calcified plaque. 2. Left mid common carotid stable moderate 50% stenosis with fibrofatty plaque. 3. Left distal common carotid and proximal ICA stable mild less than 50% stenosis  with mixed plaque of the bifurcation. 4. Retrograde flow in the left vertebral artery, uncertain patency of the V1 segment which does not enhance. 5. Left subclavian artery stable moderate 50-70% stenosis with calcified plaque. CTA head: 1. No large vessel occlusion, aneurysm, or high-grade stenosis identified. 2. Intracranial atherosclerosis with multiple areas of mild-to-moderate stenosis in the anterior and posterior circulations. Electronically Signed   By: Kristine Garbe M.D.   On: 11/08/2017 22:40      Scheduled Meds: .  stroke: mapping our early stages of recovery book   Does not apply Once  . atorvastatin  40 mg Oral q1800  . calcium acetate  2,001 mg Oral TID WC  . clopidogrel  75 mg Oral Daily  . [START ON 11/11/2017] doxercalciferol  4 mcg Intravenous Q T,Th,Sa-HD  . gabapentin  100 mg Oral QHS  . heparin  5,000 Units Subcutaneous Q8H  . insulin aspart  0-9 Units Subcutaneous TID WC  . insulin detemir  15 Units Subcutaneous QHS  . multivitamin  1 tablet Oral QHS   Continuous Infusions:   LOS: 1 day    Time spent in minutes: 35    [name], MD [facility] Pager: [email or web address] Password [email or web address] 11/10/2017, 2:57 PM

## 2017-11-11 ENCOUNTER — Encounter (HOSPITAL_COMMUNITY): Payer: Self-pay | Admitting: Neurological Surgery

## 2017-11-11 DIAGNOSIS — G959 Disease of spinal cord, unspecified: Secondary | ICD-10-CM

## 2017-11-11 LAB — GLUCOSE, CAPILLARY
GLUCOSE-CAPILLARY: 69 mg/dL (ref 65–99)
GLUCOSE-CAPILLARY: 138 mg/dL — AB (ref 65–99)
Glucose-Capillary: 49 mg/dL — ABNORMAL LOW (ref 65–99)
Glucose-Capillary: 76 mg/dL (ref 65–99)

## 2017-11-11 MED ORDER — ASPIRIN 81 MG PO CHEW
81.0000 mg | CHEWABLE_TABLET | Freq: Every day | ORAL | Status: DC
  Administered 2017-11-11: 81 mg via ORAL
  Filled 2017-11-11: qty 1

## 2017-11-11 MED ORDER — ACETAMINOPHEN 325 MG PO TABS: 650.0000 mg | ORAL_TABLET | ORAL | Status: AC | PRN

## 2017-11-11 MED ORDER — CALCIUM ACETATE (PHOS BINDER) 667 MG PO CAPS: 667.0000 mg | ORAL_CAPSULE | ORAL | 0 refills | Status: AC | PRN

## 2017-11-11 MED ORDER — ASPIRIN 81 MG PO TBEC: 325.0000 mg | DELAYED_RELEASE_TABLET | Freq: Every day | ORAL | 3 refills | Status: DC

## 2017-11-11 NOTE — Discharge Summary (Signed)
Physician Discharge Summary  Kathleen [name] HDQ:[id number] DOB: [birth year] DOA: 11/08/2017  PCP: Benito Mccreedy, MD  Admit date: 11/08/2017 Discharge date: 11/11/2017  Admitted From: home Disposition:  home   Recommendations for Outpatient Follow-up:  1. Needs to f/u with her neurologist noted below 2. She has declined SNF   Home Health:  ordered     Discharge Condition:  stable   CODE STATUS:  Full code   Consultations:  Neurology  Phone discussion with Neurosurgery     Discharge Diagnoses:  Principal Problem:   Acute CVA (cerebrovascular accident) ([place]) Active Problems:   ESRD on dialysis ([place])   CAD (coronary artery disease)   Meningioma ([place])   Chronic combined systolic and diastolic heart failure ([place])   Anemia in chronic kidney disease   Peripheral arterial disease ([place])   DM (diabetes mellitus), type 2 with renal complications ([place])   CVA (cerebral vascular accident) ([place])   Brief Summary: Kathleen [name] is a 72 y.o.femalewithhistory of ESRD on hemodialysis, IIDM2, HTN,  anemia, hyperlipidemia was referred to the ER as an outpt MRI showed a stroke  Patient has been having headache mostly in the occipital area with at times dizziness with blurred vision over the last 2 months. Patient's primary neurologist, Dr Audelia Acton, ordered open MRI of the brain which showed 2 small lacunar infarct involving the left corona radiata   Hospital Course:  Acute CVA (cerebrovascular accident) - Neuro following - CT head: few nonspecific foci of hypoattenuation in white matter including the left corona radiata which may correspond to infarctions as reported on outside MRI of the brain. CTA neck: 1. Left proximal ICA  less than 50% stenosis . 2. Left mid common carotid  moderate 50% stenosis w  3. Left distal common carotid and proximal ICA stable mild less than 50% stenosis w  4. Retrograde flow in the left vertebral artery, uncertain patency of the V1 segment which  does not enhance. 5. Left subclavian artery stable moderate 50-70% stenosis   CTA head:  mild-to-moderate stenosis in the anterior and posterior circulations. - 2 D ECHO> no thrombus- see below report - started 40 mg Lipitor, aspirin 325 (takes 81 at home)-   stroke team switched her to Plavix on 4/5 - 4/6- switching back to aspirin 325 today- see below   Active Problems:  Cervical Cord Compression with myelopathy- steadily worsening gait  -4/5- I was called by her primary neurologist, Dr Audelia Acton, in regards to a C spine MRI showing cord compression done at [facility] 2 days ago. - he has noted worsening gait and LE hyperreflexia and recommended a NS Eval - I called the consult in to NS this AM and left mine and Dr Orson Gear cell phone numbers - she walked with PT with a walker yesterday- she refuses SNF at this point - 4/6 - I have spoken with Dr Ronnald Ramp today who did not do an official consult- he states that he cannot do anything for her without the films to review and in the setting of an acute CVA who is now on Plavix- he recommends she see them as outpt- she would need to be off of platelet inhibitor for 1 wk prior to and after the surgery -  I have subsequently spoken with Dr Leonie Man who feels she will be fine with a ASA 325 alone until surgery decides what they would like to do  - PT has recommended SNF but patient declines and will go home with HHPT- she uses a rolling  walker already- she lives with her son  Meningioma CT>  Left parietal paramedian extra-axial mass measuring 28 x 22 x 7 mm compatible with meningioma.  HTN - holding Coreg and Hydralazine due to CVA- will resume Coreg this evening    ESRD on dialysis - appreciate neprho f/u - dialyzed today- next dialysis Sat    Chronic combined systolic and diastolic heart failure  - fluid management with dialysis     DM (diabetes mellitus), type 2 with renal complications ([place]) - Levemir at home dose and  SSI   Discharge Exam: Vitals:   11/11/17 0420 11/11/17 0428  BP: (!) 203/75 (!) 160/80  Pulse: 77   Resp: 18   Temp: 98.4 F (36.9 C)   SpO2: 99%    Vitals:   11/10/17 2021 11/11/17 0045 11/11/17 0420 11/11/17 0428  BP: (!) 135/37 (!) 153/53 (!) 203/75 (!) 160/80  Pulse: 82 74 77   Resp: 18 18 18    Temp: 98.4 F (36.9 C) 98.9 F (37.2 C) 98.4 F (36.9 C)   TempSrc: Oral     SpO2: 99% 96% 99%   Weight:      Height:        General: Pt is alert, awake, not in acute distress Cardiovascular: RRR, S1/S2 +, no rubs, no gallops Respiratory: CTA bilaterally, no wheezing, no rhonchi Abdominal: Soft, NT, ND, bowel sounds + Extremities: no edema, no cyanosis   Discharge Instructions  Discharge Instructions    Diet - low sodium heart healthy   Complete by:  As directed    Increase activity slowly   Complete by:  As directed      Allergies as of 11/11/2017      Reactions   Baclofen    Severe delirium when given to pateint in Dec 2018.       Medication List    STOP taking these medications   HYDROcodone-acetaminophen 5-325 MG tablet Commonly known as:  NORCO/VICODIN   ibuprofen 200 MG tablet Commonly known as:  ADVIL,MOTRIN     TAKE these medications   acetaminophen 325 MG tablet Commonly known as:  TYLENOL Take 2 tablets (650 mg total) by mouth every 4 (four) hours as needed for mild pain (or temp > 37.5 C (99.5 F)).   aspirin 81 MG EC tablet Take 4 tablets (325 mg total) by mouth daily. What changed:  how much to take   atorvastatin 40 MG tablet Commonly known as:  LIPITOR Take 1 tablet (40 mg total) by mouth daily at 6 PM.   brimonidine-timolol 0.2-0.5 % ophthalmic solution Commonly known as:  COMBIGAN Place 1 drop into the right eye 2 (two) times daily as needed (for glaucoma).   calcitRIOL 0.5 MCG capsule Commonly known as:  ROCALTROL Take 1 capsule (0.5 mcg total) by mouth Every Tuesday,Thursday,and Saturday with dialysis.   calcium acetate 667  MG capsule Commonly known as:  PHOSLO Take 2,001 mg by mouth 3 (three) times daily with meals. Take 667 mg with snack What changed:  Another medication with the same name was added. Make sure you understand how and when to take each.   calcium acetate 667 MG capsule Commonly known as:  PHOSLO Take 1 capsule (667 mg total) by mouth as needed (with snacks). What changed:  You were already taking a medication with the same name, and this prescription was added. Make sure you understand how and when to take each.   carvedilol 25 MG tablet Commonly known as:  COREG Take 25  mg by mouth 2 (two) times daily.   gabapentin 100 MG capsule Commonly known as:  NEURONTIN Take 100 mg by mouth at bedtime.   hydrALAZINE 25 MG tablet Commonly known as:  APRESOLINE Take 1 tablet (25 mg total) by mouth every 8 (eight) hours as needed (SBP > 170  or DBP > 110). What changed:  how much to take   insulin detemir 100 UNIT/ML injection Commonly known as:  LEVEMIR Inject 0.15 mLs (15 Units total) into the skin at bedtime.   multivitamin Tabs tablet Take 1 tablet by mouth at bedtime.   polyethylene glycol packet Commonly known as:  MIRALAX / GLYCOLAX Take 17 g by mouth daily. What changed:    when to take this  reasons to take this      Follow-up Information    Melburn Popper, MD. Schedule an appointment as soon as possible for a visit.   Specialty:  Neurology Why:  for 1-2 wks from now Contact information: [place] [id number] [phone]        Benito Mccreedy, MD Follow up.   Specialty:  Internal Medicine Why:  for 1-2 wks from now Contact information: [address] [place] [address] [phone]          Allergies  Allergen Reactions  . Baclofen     Severe delirium when given to pateint in Dec 2018.      Procedures/Studies: 2 D ECHO Study Conclusions  - Left ventricle: The cavity size was normal. There was  moderate   concentric hypertrophy. Systolic function was normal. The   estimated ejection fraction was in the range of 60% to 65%. Wall   motion was normal; there were no regional wall motion   abnormalities. There was an increased relative contribution of   atrial contraction to ventricular filling. Doppler parameters are   consistent with abnormal left ventricular relaxation (grade 1   diastolic dysfunction). Doppler parameters are consistent with   high ventricular filling pressure. - Mitral valve: Calcified annulus. - Tricuspid valve: There was trivial regurgitation.  Ct Angio Head W Or Wo Contrast  Result Date: 11/08/2017 CLINICAL DATA:  72 y/o F; stroke patient, follow-up angio study. Outside MRI of the brain with reported subcentimeter infarctions within in the left corona radiata. EXAM: CT ANGIOGRAPHY HEAD AND NECK TECHNIQUE: Multidetector CT imaging of the head and neck was performed using the standard protocol during bolus administration of intravenous contrast. Multiplanar CT image reconstructions and MIPs were obtained to evaluate the vascular anatomy. Carotid stenosis measurements (when applicable) are obtained utilizing NASCET criteria, using the distal internal carotid diameter as the denominator. CONTRAST:  4mL ISOVUE-370 IOPAMIDOL (ISOVUE-370) INJECTION 76% COMPARISON:  07/14/2017 CTA of head and neck. FINDINGS: CT HEAD FINDINGS Brain: No large acute vascular territory infarction, hemorrhage, or focal mass effect of the brain. There are few nonspecific foci of hypoattenuation in white matter including the left corona radiata which may correspond to infarctions as reported on outside MRI of the brain. Left parietal paramedian extra-axial mass measuring 28 x 22 x 7 mm (AP x ML x CC series 8, image 39 and series 9, image 31) compatible with meningioma. Vascular: As below. Skull: Normal. Negative for fracture or focal lesion. Sinuses: Imaged portions are clear. Orbits: No acute finding.  Review of the MIP images confirms the above findings CTA NECK FINDINGS Aortic arch: Bovine variant branching. Imaged portion shows no evidence of aneurysm or dissection. Left subclavian artery  calcified plaque with moderate 50-70% stenosis. Calcific atherosclerosis of aortic arch. Right carotid system: No dissection or aneurysm identified. Calcified plaque of carotid bifurcation and proximal ICA with mild less than 50% proximal ICA stenosis. Left carotid system: No dissection or aneurysm identified. Left common carotid fibrofatty plaque with moderate 50% stenosis (series 12, image 235). Mixed plaque of the carotid bifurcation with mild less than 50% distal cavernous and proximal ICA stenosis. Vertebral arteries: Codominant. No evidence of dissection, stenosis (50% or greater) or occlusion of the right vertebral artery. The left vertebral artery enhances with increased attenuation superiorly and gradient decreased attenuation of the V1 segment which is poorly enhanced indicating retrograde flow. Skeleton: Moderate cervical spondylosis with discogenic degenerative changes greatest at the C5-C7 levels. No high-grade bony canal stenosis. Other neck: Negative. Upper chest: Negative. Review of the MIP images confirms the above findings CTA HEAD FINDINGS Anterior circulation: Mild right paraophthalmic ICA and left cavernous ICA stenosis. Mild left terminal ICA stenosis. No large vessel occlusion, aneurysm, or high-grade stenosis identified. Posterior circulation: Moderate stenosis of proximal basilar artery. Segments of mild stenosis of bilateral P2 segments. No large vessel occlusion, aneurysm, or high-grade stenosis identified. Venous sinuses: As permitted by contrast timing, patent. Anatomic variants: Bilateral fetal PCA and small caliber vertebrobasilar system. Delayed phase: No abnormal intracranial enhancement. Review of the MIP images confirms the above findings IMPRESSION: CT head: 1. No large acute vascular  territory infarction,, mass effect, or hemorrhage. 2. Several nonspecific foci of hypoattenuation in white matter including within left corona radiata which may correspond to reported infarctions on outside MRI. CTA neck: 1. Left proximal ICA stable mild less than 50% stenosis with calcified plaque. 2. Left mid common carotid stable moderate 50% stenosis with fibrofatty plaque. 3. Left distal common carotid and proximal ICA stable mild less than 50% stenosis with mixed plaque of the bifurcation. 4. Retrograde flow in the left vertebral artery, uncertain patency of the V1 segment which does not enhance. 5. Left subclavian artery stable moderate 50-70% stenosis with calcified plaque. CTA head: 1. No large vessel occlusion, aneurysm, or high-grade stenosis identified. 2. Intracranial atherosclerosis with multiple areas of mild-to-moderate stenosis in the anterior and posterior circulations. Electronically Signed   By: Kristine Garbe M.D.   On: 11/08/2017 22:40   Ct Angio Neck W Or Wo Contrast  Result Date: 11/08/2017 CLINICAL DATA:  72 y/o F; stroke patient, follow-up angio study. Outside MRI of the brain with reported subcentimeter infarctions within in the left corona radiata. EXAM: CT ANGIOGRAPHY HEAD AND NECK TECHNIQUE: Multidetector CT imaging of the head and neck was performed using the standard protocol during bolus administration of intravenous contrast. Multiplanar CT image reconstructions and MIPs were obtained to evaluate the vascular anatomy. Carotid stenosis measurements (when applicable) are obtained utilizing NASCET criteria, using the distal internal carotid diameter as the denominator. CONTRAST:  56mL ISOVUE-370 IOPAMIDOL (ISOVUE-370) INJECTION 76% COMPARISON:  07/14/2017 CTA of head and neck. FINDINGS: CT HEAD FINDINGS Brain: No large acute vascular territory infarction, hemorrhage, or focal mass effect of the brain. There are few nonspecific foci of hypoattenuation in white matter  including the left corona radiata which may correspond to infarctions as reported on outside MRI of the brain. Left parietal paramedian extra-axial mass measuring 28 x 22 x 7 mm (AP x ML x CC series 8, image 39 and series 9, image 31) compatible with meningioma. Vascular: As below. Skull: Normal. Negative for fracture or focal lesion. Sinuses: Imaged portions are clear. Orbits: No acute finding.  Review of the MIP images confirms the above findings CTA NECK FINDINGS Aortic arch: Bovine variant branching. Imaged portion shows no evidence of aneurysm or dissection. Left subclavian artery calcified plaque with moderate 50-70% stenosis. Calcific atherosclerosis of aortic arch. Right carotid system: No dissection or aneurysm identified. Calcified plaque of carotid bifurcation and proximal ICA with mild less than 50% proximal ICA stenosis. Left carotid system: No dissection or aneurysm identified. Left common carotid fibrofatty plaque with moderate 50% stenosis (series 12, image 235). Mixed plaque of the carotid bifurcation with mild less than 50% distal cavernous and proximal ICA stenosis. Vertebral arteries: Codominant. No evidence of dissection, stenosis (50% or greater) or occlusion of the right vertebral artery. The left vertebral artery enhances with increased attenuation superiorly and gradient decreased attenuation of the V1 segment which is poorly enhanced indicating retrograde flow. Skeleton: Moderate cervical spondylosis with discogenic degenerative changes greatest at the C5-C7 levels. No high-grade bony canal stenosis. Other neck: Negative. Upper chest: Negative. Review of the MIP images confirms the above findings CTA HEAD FINDINGS Anterior circulation: Mild right paraophthalmic ICA and left cavernous ICA stenosis. Mild left terminal ICA stenosis. No large vessel occlusion, aneurysm, or high-grade stenosis identified. Posterior circulation: Moderate stenosis of proximal basilar artery. Segments of mild  stenosis of bilateral P2 segments. No large vessel occlusion, aneurysm, or high-grade stenosis identified. Venous sinuses: As permitted by contrast timing, patent. Anatomic variants: Bilateral fetal PCA and small caliber vertebrobasilar system. Delayed phase: No abnormal intracranial enhancement. Review of the MIP images confirms the above findings IMPRESSION: CT head: 1. No large acute vascular territory infarction,, mass effect, or hemorrhage. 2. Several nonspecific foci of hypoattenuation in white matter including within left corona radiata which may correspond to reported infarctions on outside MRI. CTA neck: 1. Left proximal ICA stable mild less than 50% stenosis with calcified plaque. 2. Left mid common carotid stable moderate 50% stenosis with fibrofatty plaque. 3. Left distal common carotid and proximal ICA stable mild less than 50% stenosis with mixed plaque of the bifurcation. 4. Retrograde flow in the left vertebral artery, uncertain patency of the V1 segment which does not enhance. 5. Left subclavian artery stable moderate 50-70% stenosis with calcified plaque. CTA head: 1. No large vessel occlusion, aneurysm, or high-grade stenosis identified. 2. Intracranial atherosclerosis with multiple areas of mild-to-moderate stenosis in the anterior and posterior circulations. Electronically Signed   By: Kristine Garbe M.D.   On: 11/08/2017 22:40     The results of significant diagnostics from this hospitalization (including imaging, microbiology, ancillary and laboratory) are listed below for reference.     Microbiology: Recent Results (from the past 240 hour(s))  MRSA PCR Screening     Status: None   Collection Time: 11/09/17  8:34 AM  Result Value Ref Range Status   MRSA by PCR NEGATIVE NEGATIVE Final    Comment:        The GeneXpert MRSA Assay (FDA approved for NASAL specimens only), is one component of a comprehensive MRSA colonization surveillance program. It is not intended to  diagnose MRSA infection nor to guide or monitor treatment for MRSA infections. Performed at [facility], [place] [address], [place], [place] [address]      Labs: BNP (last 3 results) Recent Labs    02/07/17 0029  BNP 256.3*   Basic Metabolic Panel: Recent Labs  Lab 11/08/17 1648 11/08/17 1659 11/09/17 0329 11/10/17 0817  NA 137 134*  --  133*  K 4.3 4.2  --  4.5  CL 96* 95*  --  96*  CO2  --  27  --  26  GLUCOSE 361* 358*  --  158*  BUN 35* 34*  --  60*  CREATININE 4.00* 4.02* 4.59* 6.00*  CALCIUM  --  8.8*  --  9.4  PHOS  --   --   --  6.2*   Liver Function Tests: Recent Labs  Lab 11/08/17 1659 11/10/17 0817  AST 19  --   ALT 8*  --   ALKPHOS 82  --   BILITOT 0.7  --   PROT 6.6  --   ALBUMIN 3.6 3.2*   No results for input(s): LIPASE, AMYLASE in the last 168 hours. No results for input(s): AMMONIA in the last 168 hours. CBC: Recent Labs  Lab 11/08/17 1648 11/08/17 1659 11/09/17 0329 11/10/17 0817  WBC  --  4.7 4.4 4.8  NEUTROABS  --  2.4  --   --   HGB 10.5* 10.0* 9.8* 9.1*  HCT 31.0* 31.9* 31.3* 29.8*  MCV  --  100.9* 100.6* 99.0  PLT  --  181 175 196   Cardiac Enzymes: No results for input(s): CKTOTAL, CKMB, CKMBINDEX, TROPONINI in the last 168 hours. BNP: Invalid input(s): POCBNP CBG: Recent Labs  Lab 11/09/17 1715 11/09/17 2142 11/10/17 1219 11/10/17 1724 11/10/17 2143  GLUCAP 133* 146* 64* 135* 156*   D-Dimer No results for input(s): DDIMER in the last 72 hours. Hgb A1c Recent Labs    11/09/17 0329  HGBA1C 7.5*   Lipid Profile Recent Labs    11/09/17 0329  CHOL 234*  HDL 62  LDLCALC 154*  TRIG 88  CHOLHDL 3.8   Thyroid function studies No results for input(s): TSH, T4TOTAL, T3FREE, THYROIDAB in the last 72 hours.  Invalid input(s): FREET3 Anemia work up No results for input(s): VITAMINB12, FOLATE, FERRITIN, TIBC, IRON, RETICCTPCT in the last 72 hours. Urinalysis    Component Value Date/Time   COLORURINE  YELLOW 07/13/2017 2335   APPEARANCEUR CLEAR 07/13/2017 2335   LABSPEC 1.009 07/13/2017 2335   PHURINE 6.0 07/13/2017 2335   GLUCOSEU NEGATIVE 07/13/2017 2335   HGBUR NEGATIVE 07/13/2017 2335   BILIRUBINUR NEGATIVE 07/13/2017 2335   KETONESUR NEGATIVE 07/13/2017 2335   PROTEINUR 100 (A) 07/13/2017 2335   UROBILINOGEN 0.2 06/11/2014 0639   NITRITE NEGATIVE 07/13/2017 2335   LEUKOCYTESUR NEGATIVE 07/13/2017 2335   Sepsis Labs Invalid input(s): PROCALCITONIN,  WBC,  LACTICIDVEN Microbiology Recent Results (from the past 240 hour(s))  MRSA PCR Screening     Status: None   Collection Time: 11/09/17  8:34 AM  Result Value Ref Range Status   MRSA by PCR NEGATIVE NEGATIVE Final    Comment:        The GeneXpert MRSA Assay (FDA approved for NASAL specimens only), is one component of a comprehensive MRSA colonization surveillance program. It is not intended to diagnose MRSA infection nor to guide or monitor treatment for MRSA infections. Performed at [facility], [place] [address] [place], [place] [address]      Time coordinating discharge: 40 min- > 50 % time spent in speaking with Dr Ronnald Ramp and Dr Leonie Man and explaining to patient.  SIGNED:   [name], MD  [facility] 11/11/2017, 9:14 AM Pager   If 7PM-7AM, please contact night-coverage [email or web address] Password [email or web address]

## 2017-11-11 NOTE — Discharge Instructions (Signed)
You have a meningioma and have had a stroke and have some compression in your spinal cord in you neck. You will need to go back to your Neurologist to further discuss this in the next 1-2 wks. You should also see your PCP in 1-2 wks. Home physical, occupational therapy, home nurse and aid will be arranged for you.    Ischemic Stroke An ischemic stroke is the sudden death of brain tissue. Blood carries oxygen to all areas of the body. This type of stroke happens when your blood does not flow to your brain like normal. Your brain cannot get the oxygen it needs. This is an emergency. It must be treated right away. Symptoms of a stroke usually happen all of a sudden. You may notice them when you wake up. They can include: Weakness or loss of feeling in your face, arm, or leg. This often happens on one side of the body. Trouble walking. Trouble moving your arms or legs. Loss of balance or coordination. Feeling confused. Trouble talking or understanding what people are saying. Slurred speech. Trouble seeing. Seeing two of one object (double vision). Feeling dizzy. Feeling sick to your stomach (nauseous) and throwing up (vomiting). A very bad headache for no reason.  Get help as soon as any of these problems start. This is important. Some treatments work better if they are given right away. These include: Aspirin. Medicines to control blood pressure. A shot (injection) of medicine to break up the blood clot. Treatments given in the blood vessel (artery) to take out the clot or break it up.  Other treatments may include: Oxygen. Fluids given through an IV tube. Medicines to thin out your blood. Procedures to help your blood flow better.  What increases the risk? Certain things may make you more likely to have a stroke. Some of these are things that you can change, such as: Being very overweight (obesity). Smoking. Taking birth control pills. Not being active. Drinking too much  alcohol. Using drugs.  Other risk factors include: High blood pressure. High cholesterol. Diabetes. Heart disease. Being [place] American, Native American, Hispanic, or [place] Native. Being over age 14. Family history of stroke. Having had blood clots, stroke, or warning stroke (transient ischemic attack, TIA) in the past. Sickle cell disease. Being a woman with a history of high blood pressure in pregnancy (preeclampsia). Migraine headache. Sleep apnea. Having an irregular heartbeat (atrial fibrillation). Long-term (chronic) diseases that cause soreness and swelling (inflammation). Disorders that affect how your blood clots.  Follow these instructions at home: Medicines Take over-the-counter and prescription medicines only as told by your doctor. If you were told to take aspirin or another medicine to thin your blood, take it exactly as told by your doctor. Taking too much of the medicine can cause bleeding. If you do not take enough, it may not work as well. Know the side effects of your medicines. If you are taking a blood thinner, make sure you: Hold pressure over any cuts for longer than usual. Tell your dentist and other doctors that you take this medicine. Avoid activities that may cause damage or injury to your body. Eating and drinking Follow instructions from your doctor about what you cannot eat or drink. Eat healthy foods. If you have trouble with swallowing, do these things to avoid choking: Take small bites when eating. Eat foods that are soft or pureed. Safety Follow instructions from your health care team about physical activity. Use a walker or cane as told by your  doctor. Keep your home safe so you do not fall. This may include: Having experts look at your home to make sure it is safe. Putting grab bars in the bedroom and bathroom. Using raised toilets. Putting a seat in the shower. General instructions Do not use any tobacco products. Examples of these  are cigarettes, chewing tobacco, and e-cigarettes. If you need help quitting, ask your doctor. Limit how much alcohol you drink. This means no more than 1 drink a day for nonpregnant women and 2 drinks a day for men. One drink equals 12 oz of beer, 5 oz of wine, or 1 oz of hard liquor. If you need help to stop using drugs or alcohol, ask your doctor to refer you to a program or specialist. Stay active. Exercise as told by your doctor. Keep all follow-up visits as told by your doctor. This is important. Get help right away if: You suddenly: Have weakness or loss of feeling in your face, arm, or leg. Feel confused. Have trouble talking or understanding what people are saying. Have trouble seeing. Have trouble walking. Have trouble moving your arms or legs. Feel dizzy. Lose your balance or coordination. Have a very bad headache and you do not know why. You pass out (lose consciousness) or almost pass out. You have jerky movements that you cannot control (seizure). These symptoms may be an emergency. Do not wait to see if the symptoms will go away. Get medical help right away. Call your local emergency services (911 in the [place] Do not drive yourself to the hospital. This information is not intended to replace advice given to you by your health care provider. Make sure you discuss any questions you have with your health care provider. Document Released: 07/14/2011 Document Revised: 01/05/2016 Document Reviewed: 10/21/2015 Elsevier Interactive Patient Education  [employer]. . See information below.     Meningioma Meningioma is a tumor that occurs in the thin tissue that covers the brain and spinal cord (meninges). Meningiomas are usually not cancerous (benign) and do not spread to other areas. In rare cases, a meningioma may become cancerous (malignant). What are the causes? In many cases, the cause of this condition is not known. In some cases, meningioma may be caused  by:  Having a genetic disorder that causes multiple soft tumors (neurofibromatosis 2).  A change in certain genes (genetic mutation).  What increases the risk? You are more likely to develop this condition if:  You have been exposed to radiation.  You are an older woman. Older women have a higher risk of meningiomas than men or children. However, men have a higher risk of malignant meningiomas.  You have injured your head in the past.  You have a history of breast cancer.  What are the signs or symptoms? Symptoms of this condition usually begin very slowly. The symptoms may depend on the size and location of the tumor. Possible symptoms include:  Headaches.  Nausea and vomiting.  Vision changes.  Hearing changes.  Loss of the sense of smell.  Fits of uncontrolled movements (seizures).  Weakness or numbness on one side of the body or in an arm or leg.  Mood or personality changes.  Problems with memory or thinking.  How is this diagnosed? This condition is diagnosed based on:  Results of brain imaging tests, such as a CT scan or MRI.  Removal and testing of a sample of the tumor (biopsy). This may be done to confirm the diagnosis and to help  determine the best treatment for the condition.  How is this treated? You may not have treatment until your symptoms start to affect your daily activities. This is because meningioma grows so slowly, and your health care provider may prefer to monitor its growth before starting treatment. If you do need treatment, it may include:  Medicines to decrease brain swelling and improve symptoms (steroids).  High-energy rays (radiation therapy) to shrink or kill the tumor.  Anti-cancer medicines (chemotherapy) to shrink or kill the tumor. Chemotherapy has many side effects because it also kills healthy cells.  Targeted therapy. This kills cancerous cells without affecting normal cells.  Surgery to remove as much of the tumor as  possible.  Follow these instructions at home:  Take over-the-counter and prescription medicines only as told by your health care provider.  Keep all follow-up visits as told by your health care provider. This is important. You may need regular visits to monitor the growth of your tumor. Contact a health care provider if:  You have symptoms that come back.  You have diarrhea.  You vomit.  You have abdominal pain.  You cannot eat or drink as much as you need.  You are weaker or more tired than usual.  You are losing weight without trying. Get help right away if:  Your diarrhea, vomiting, or abdominal pain does not go away.  You have new symptoms, such as vision problems or difficulty walking.  You have a seizure.  You have bleeding that does not stop.  You have trouble breathing.  You have a fever. Summary  Meningioma is a tumor that occurs in the thin tissue that covers the brain and spinal cord (meninges).  Meningiomas are usually benign, which means they are not cancerous and do not spread to other areas.  Symptoms of this condition usually begin very slowly. The symptoms may depend on the size and location of the tumor.  Your tumor may be monitored over time. You may not need treatment until your tumor starts to affect your daily life. This information is not intended to replace advice given to you by your health care provider. Make sure you discuss any questions you have with your health care provider. Document Released: 07/30/2013 Document Revised: 07/29/2016 Document Reviewed: 07/29/2016 Elsevier Interactive Patient Education  2017 [employer].

## 2017-11-11 NOTE — Care Management Note (Addendum)
Case Management Note  Patient Details  Name: KAILYNN SATTERLY MRN: [id number] Date of Birth: 1946/03/06  Subjective/Objective:                 Spoke with patient who is agreeable to [facility] services. She is familiar w [facility] as she used them previously this year, and would like to use them again. Referral placed to [facility]. [facility] unable to accept at this time. Referral placed and accepted to [facility]. Patient states she has all DME needed, declines additional DME. Will have the help of her daughter at DC. No further CM needs.    Action/Plan:   Expected Discharge Date:  11/11/17               Expected Discharge Plan:  [place]  In-House Referral:     Discharge planning Services  CM Consult  Post Acute Care Choice:  Home Health Choice offered to:  Patient  DME Arranged:    DME Agency:     HH Arranged:  RN, PT, OT, Nurse's Aide, Social Work [employer] Agency:  Nanine Means  Status of Service:  Completed, signed off  If discussed at [employer], dates discussed:    Additional Comments:  Carles Collet, RN 11/11/2017, 10:22 AM

## 2017-11-11 NOTE — Plan of Care (Signed)
Progressing

## 2017-11-11 NOTE — Progress Notes (Addendum)
CRITICAL VALUE ALERT  Critical Value:  CBG 49  Date & Time Notied:  11/11/17 @ 0812  Provider Notified:   Orders Received/Actions taken: 15 mL of juice given. Repeat CBG 76

## 2017-11-11 NOTE — Progress Notes (Signed)
CRITICAL VALUE ALERT  Critical Value:  CBG 69  Date & Time Notied:  04/0/19 @ 1500  Provider Notified:   Orders Received/Actions taken: Juice given. Repeat CBG 138

## 2017-11-11 NOTE — Consult Note (Signed)
Reason for Consult: possible cervical spinal stenosis Referring Physician: hospitalist  Kathleen [name] is an 72 y.o. female.   HPI:  72 year old female who is admitted with a recent cervical vascular accident. She was started on Plavix. She has seen neurology. Her outpatient neurologist ordered a MRI of the cervical spine because he felt her gait might be somewhat worse, he had a recent report suggesting spinal stenosis at C4-5 with cord compression and he asked the hospitalist to consult neurosurgery regarding this. I do not have the images for my review. I have not seen the report. The patient complains of neck pain for 2-3 months. She complains of some hand numbness for 2-3 weeks.She walks with a walker.  Past Medical History:  Diagnosis Date  . Anemia    a. 01/2014: suspected of chronic disease, negative FOBT.  [name]    knees  . Asthma    many years ago  . CAD (coronary artery disease)    a. Cath 01/2014: mild in LAD, mild-mod LCx, mod-severe RCA - for med rx.  . Diabetes mellitus without complication ([place])   . ESRD (end stage renal disease) ([place])    [place]  . History of echocardiogram    Echo (10/15):  Mod LVH, EF 50-55%, Gr 1 DD, MAC, mild MR, mild TR, PASP 35 mmHg  . Hx of transfusion   . Hyperlipidemia   . Hypertension   . Meningioma ([place])    a. Incidental dx 01/2014.  . Obesity   . Renal insufficiency   . Systolic CHF ([place])    a. 09/9560: dx with mixed ICM/NICM (out of proportion to CAD) EF 25-30% by echo.     Past Surgical History:  Procedure Laterality Date  . ABDOMINAL HYSTERECTOMY    . AV FISTULA PLACEMENT Left 02/10/2017   Procedure: ARTERIOVENOUS (AV) FISTULA CREATION-LEFT FOREARM;  Surgeon: Elam Dutch, MD;  Location: [facility];  Service: Vascular;  Laterality: Left;  . BREAST SURGERY Right    Lumpectomy  . COLONOSCOPY    . FISTULA SUPERFICIALIZATION Left 08/09/2017   Procedure: FISTULA SUPERFICIALIZATION LEFT ARM ARTERIOVENOUS FISTULA;   Surgeon: Conrad , MD;  Location: [facility];  Service: Vascular;  Laterality: Left;  . INSERTION OF DIALYSIS CATHETER Right 02/10/2017   Procedure: INSERTION OF DIALYSIS CATHETER;  Surgeon: Elam Dutch, MD;  Location: [facility];  Service: Vascular;  Laterality: Right;  . LEFT HEART CATHETERIZATION WITH CORONARY ANGIOGRAM N/A 01/27/2014   Procedure: LEFT HEART CATHETERIZATION WITH CORONARY ANGIOGRAM;  Surgeon: Jettie Booze, MD;  Location: [facility] CATH LAB;  Service: Cardiovascular;  Laterality: N/A;  . TONSILLECTOMY      Allergies  Allergen Reactions  . Baclofen     Severe delirium when given to pateint in Dec 2018.     Social History   Tobacco Use  . Smoking status: Former Smoker    Packs/day: 0.25    Years: 30.00    Pack years: 7.50    Types: Cigarettes  . Smokeless tobacco: Never Used  . Tobacco comment: quit late 1990's  Substance Use Topics  . Alcohol use: No    Family History  Problem Relation Age of Onset  . Diabetic kidney disease Mother   . Hypertension Mother   . Heart failure Mother   . Heart attack Mother   . Hyperlipidemia Mother   . Hypertension Father      Review of Systems  Positive ROS: neg  All other systems have been reviewed and were  otherwise negative with the exception of those mentioned in the HPI and as above.  Objective: Vital signs in last 24 hours: Temp:  [98.4 F (36.9 C)-98.9 F (37.2 C)] 98.4 F (36.9 C) (04/06 0420) Pulse Rate:  [74-82] 77 (04/06 0420) Resp:  [16-18] 18 (04/06 0420) BP: (135-203)/(37-80) 160/80 (04/06 0428) SpO2:  [96 %-99 %] 99 % (04/06 0420)  General Appearance: Alert, cooperative, no distress, appears stated age Head: Normocephalic, without obvious abnormality, atraumatic Eyes: PERRL, conjunctiva/corneas clear, EOM's intact     Lungs:  respirations unlabored Heart: Regular rate and rhythm Abdomen: Soft Extremities: Extremities normal, atraumatic, no cyanosis or edema Pulses: 2+ and symmetric all  extremities Skin: Skin color, texture, turgor normal, no rashes or lesions  NEUROLOGIC:   Mental status: A&O x4, no aphasia, good attention span, Memory and fund of knowledge Motor Exam - grossly normal, normal tone and bulk  except hand grips are 4 out of 5 bilaterally Sensory Exam - grossly normal Reflexes: symmetric, no pathologic reflexes, No Hoffman's, No clonus Coordination - grossly normal Gait - slow and narrow based with a walker but not spastic Balance - grossly normal Cranial Nerves: I: smell Not tested  II: visual acuity  OS: na    OD: na  II: visual fields Full to confrontation  II: pupils Equal, round, reactive to light  III,VII: ptosis None  III,IV,VI: extraocular muscles  Full ROM  V: mastication Normal  V: facial light touch sensation  Normal  V,VII: corneal reflex  Present  VII: facial muscle function - upper  Normal  VII: facial muscle function - lower Normal  VIII: hearing Not tested  IX: soft palate elevation  Normal  IX,X: gag reflex Present  XI: trapezius strength  5/5  XI: sternocleidomastoid strength 5/5  XI: neck flexion strength  5/5  XII: tongue strength  Normal    Data Review Lab Results  Component Value Date   WBC 4.8 11/10/2017   HGB 9.1 (L) 11/10/2017   HCT 29.8 (L) 11/10/2017   MCV 99.0 11/10/2017   PLT 196 11/10/2017   Lab Results  Component Value Date   NA 133 (L) 11/10/2017   K 4.5 11/10/2017   CL 96 (L) 11/10/2017   CO2 26 11/10/2017   BUN 60 (H) 11/10/2017   CREATININE 6.00 (H) 11/10/2017   GLUCOSE 158 (H) 11/10/2017   Lab Results  Component Value Date   INR 1.11 11/08/2017    Radiology: Ct Angio Head W Or Wo Contrast  Result Date: 11/08/2017 CLINICAL DATA:  72 y/o F; stroke patient, follow-up angio study. Outside MRI of the brain with reported subcentimeter infarctions within in the left corona radiata. EXAM: CT ANGIOGRAPHY HEAD AND NECK TECHNIQUE: Multidetector CT imaging of the head and neck was performed using the  standard protocol during bolus administration of intravenous contrast. Multiplanar CT image reconstructions and MIPs were obtained to evaluate the vascular anatomy. Carotid stenosis measurements (when applicable) are obtained utilizing NASCET criteria, using the distal internal carotid diameter as the denominator. CONTRAST:  29m ISOVUE-370 IOPAMIDOL (ISOVUE-370) INJECTION 76% COMPARISON:  07/14/2017 CTA of head and neck. FINDINGS: CT HEAD FINDINGS Brain: No large acute vascular territory infarction, hemorrhage, or focal mass effect of the brain. There are few nonspecific foci of hypoattenuation in white matter including the left corona radiata which may correspond to infarctions as reported on outside MRI of the brain. Left parietal paramedian extra-axial mass measuring 28 x 22 x 7 mm (AP x ML x CC series 8, image  39 and series 9, image 31) compatible with meningioma. Vascular: As below. Skull: Normal. Negative for fracture or focal lesion. Sinuses: Imaged portions are clear. Orbits: No acute finding. Review of the MIP images confirms the above findings CTA NECK FINDINGS Aortic arch: Bovine variant branching. Imaged portion shows no evidence of aneurysm or dissection. Left subclavian artery calcified plaque with moderate 50-70% stenosis. Calcific atherosclerosis of aortic arch. Right carotid system: No dissection or aneurysm identified. Calcified plaque of carotid bifurcation and proximal ICA with mild less than 50% proximal ICA stenosis. Left carotid system: No dissection or aneurysm identified. Left common carotid fibrofatty plaque with moderate 50% stenosis (series 12, image 235). Mixed plaque of the carotid bifurcation with mild less than 50% distal cavernous and proximal ICA stenosis. Vertebral arteries: Codominant. No evidence of dissection, stenosis (50% or greater) or occlusion of the right vertebral artery. The left vertebral artery enhances with increased attenuation superiorly and gradient decreased  attenuation of the V1 segment which is poorly enhanced indicating retrograde flow. Skeleton: Moderate cervical spondylosis with discogenic degenerative changes greatest at the C5-C7 levels. No high-grade bony canal stenosis. Other neck: Negative. Upper chest: Negative. Review of the MIP images confirms the above findings CTA HEAD FINDINGS Anterior circulation: Mild right paraophthalmic ICA and left cavernous ICA stenosis. Mild left terminal ICA stenosis. No large vessel occlusion, aneurysm, or high-grade stenosis identified. Posterior circulation: Moderate stenosis of proximal basilar artery. Segments of mild stenosis of bilateral P2 segments. No large vessel occlusion, aneurysm, or high-grade stenosis identified. Venous sinuses: As permitted by contrast timing, patent. Anatomic variants: Bilateral fetal PCA and small caliber vertebrobasilar system. Delayed phase: No abnormal intracranial enhancement. Review of the MIP images confirms the above findings IMPRESSION: CT head: 1. No large acute vascular territory infarction,, mass effect, or hemorrhage. 2. Several nonspecific foci of hypoattenuation in white matter including within left corona radiata which may correspond to reported infarctions on outside MRI. CTA neck: 1. Left proximal ICA stable mild less than 50% stenosis with calcified plaque. 2. Left mid common carotid stable moderate 50% stenosis with fibrofatty plaque. 3. Left distal common carotid and proximal ICA stable mild less than 50% stenosis with mixed plaque of the bifurcation. 4. Retrograde flow in the left vertebral artery, uncertain patency of the V1 segment which does not enhance. 5. Left subclavian artery stable moderate 50-70% stenosis with calcified plaque. CTA head: 1. No large vessel occlusion, aneurysm, or high-grade stenosis identified. 2. Intracranial atherosclerosis with multiple areas of mild-to-moderate stenosis in the anterior and posterior circulations. Electronically Signed   By:  Kristine Garbe M.D.   On: 11/08/2017 22:40   Ct Angio Neck W Or Wo Contrast  Result Date: 11/08/2017 CLINICAL DATA:  72 y/o F; stroke patient, follow-up angio study. Outside MRI of the brain with reported subcentimeter infarctions within in the left corona radiata. EXAM: CT ANGIOGRAPHY HEAD AND NECK TECHNIQUE: Multidetector CT imaging of the head and neck was performed using the standard protocol during bolus administration of intravenous contrast. Multiplanar CT image reconstructions and MIPs were obtained to evaluate the vascular anatomy. Carotid stenosis measurements (when applicable) are obtained utilizing NASCET criteria, using the distal internal carotid diameter as the denominator. CONTRAST:  3m ISOVUE-370 IOPAMIDOL (ISOVUE-370) INJECTION 76% COMPARISON:  07/14/2017 CTA of head and neck. FINDINGS: CT HEAD FINDINGS Brain: No large acute vascular territory infarction, hemorrhage, or focal mass effect of the brain. There are few nonspecific foci of hypoattenuation in white matter including the left corona radiata which may correspond to  infarctions as reported on outside MRI of the brain. Left parietal paramedian extra-axial mass measuring 28 x 22 x 7 mm (AP x ML x CC series 8, image 39 and series 9, image 31) compatible with meningioma. Vascular: As below. Skull: Normal. Negative for fracture or focal lesion. Sinuses: Imaged portions are clear. Orbits: No acute finding. Review of the MIP images confirms the above findings CTA NECK FINDINGS Aortic arch: Bovine variant branching. Imaged portion shows no evidence of aneurysm or dissection. Left subclavian artery calcified plaque with moderate 50-70% stenosis. Calcific atherosclerosis of aortic arch. Right carotid system: No dissection or aneurysm identified. Calcified plaque of carotid bifurcation and proximal ICA with mild less than 50% proximal ICA stenosis. Left carotid system: No dissection or aneurysm identified. Left common carotid fibrofatty  plaque with moderate 50% stenosis (series 12, image 235). Mixed plaque of the carotid bifurcation with mild less than 50% distal cavernous and proximal ICA stenosis. Vertebral arteries: Codominant. No evidence of dissection, stenosis (50% or greater) or occlusion of the right vertebral artery. The left vertebral artery enhances with increased attenuation superiorly and gradient decreased attenuation of the V1 segment which is poorly enhanced indicating retrograde flow. Skeleton: Moderate cervical spondylosis with discogenic degenerative changes greatest at the C5-C7 levels. No high-grade bony canal stenosis. Other neck: Negative. Upper chest: Negative. Review of the MIP images confirms the above findings CTA HEAD FINDINGS Anterior circulation: Mild right paraophthalmic ICA and left cavernous ICA stenosis. Mild left terminal ICA stenosis. No large vessel occlusion, aneurysm, or high-grade stenosis identified. Posterior circulation: Moderate stenosis of proximal basilar artery. Segments of mild stenosis of bilateral P2 segments. No large vessel occlusion, aneurysm, or high-grade stenosis identified. Venous sinuses: As permitted by contrast timing, patent. Anatomic variants: Bilateral fetal PCA and small caliber vertebrobasilar system. Delayed phase: No abnormal intracranial enhancement. Review of the MIP images confirms the above findings IMPRESSION: CT head: 1. No large acute vascular territory infarction,, mass effect, or hemorrhage. 2. Several nonspecific foci of hypoattenuation in white matter including within left corona radiata which may correspond to reported infarctions on outside MRI. CTA neck: 1. Left proximal ICA stable mild less than 50% stenosis with calcified plaque. 2. Left mid common carotid stable moderate 50% stenosis with fibrofatty plaque. 3. Left distal common carotid and proximal ICA stable mild less than 50% stenosis with mixed plaque of the bifurcation. 4. Retrograde flow in the left vertebral  artery, uncertain patency of the V1 segment which does not enhance. 5. Left subclavian artery stable moderate 50-70% stenosis with calcified plaque. CTA head: 1. No large vessel occlusion, aneurysm, or high-grade stenosis identified. 2. Intracranial atherosclerosis with multiple areas of mild-to-moderate stenosis in the anterior and posterior circulations. Electronically Signed   By: Kristine Garbe M.D.   On: 11/08/2017 22:40     Assessment/Plan: Estimated body mass index is 33.62 kg/m as calculated from the following:   Height as of this encounter: _0  (1.6 m).   Weight as of this encounter: 86.1 kg (189 lb 13.1 oz).   60-year-old female with reported spinal stenosis of the cervical spine. Her exam is not overly impressive at this point. She had a recent cervical vascular accident was started on antiplatelet agents. I suspect this can be quite a while before she is going to be a vertical candidate. She would have to be off all antiplatelet agents and NSAIDs for at least 1 week prior surgery and likely one week postop. We cannot risk hematoma retropharyngeal space after ACDF. I'm  happy to see her as an outpatient with her imaging to evaluate this. I do not find this to be an urgent situation or emergent situation, and certainly the risks of any surgery at this point far outweigh the potential benefits.   Collyn Ribas S 11/11/2017 12:26 PM

## 2017-11-11 NOTE — Progress Notes (Signed)
Kathleen [name] to be D/C'd Home per MD order.  Discussed with the patient and all questions fully answered.  VSS, Skin clean, dry and intact without evidence of skin break down, no evidence of skin tears noted. IV catheter discontinued intact. Site without signs and symptoms of complications. Dressing and pressure applied.  An After Visit Summary was printed and given to the patient. Patient received prescription.  D/c education completed with patient/family including follow up instructions, medication list, d/c activities limitations if indicated, with other d/c instructions as indicated by MD - patient able to verbalize understanding, all questions fully answered.   Patient instructed to return to ED, call 911, or call MD for any changes in condition.   Patient escorted via [place], and D/C home via private auto.  [name] 11/11/2017 6:36 PM

## 2017-11-11 NOTE — Progress Notes (Signed)
Stroke team addendum note;  I was informed by Dr Wynelle Cleveland that she  got a call from patient`s primary neurologist  , Dr Audelia Acton, in regards to a C spine MRI showing cord compression done at [facility] 2 days ago but I am unable to locate either report or films in care everywhere. She has mild LE hyperreflexia and subacute worsening gait and likely chronic cervical myelopathy. She can be referred to neurosurgery as outpatient as this is not acute problem. DC Plavix and continue aspirin 325 mg alone for stroke prevention.D/W Dr Wynelle Cleveland

## 2017-11-14 DIAGNOSIS — D509 Iron deficiency anemia, unspecified: Secondary | ICD-10-CM | POA: Diagnosis not present

## 2017-11-14 DIAGNOSIS — Z992 Dependence on renal dialysis: Secondary | ICD-10-CM | POA: Diagnosis not present

## 2017-11-14 DIAGNOSIS — E8770 Fluid overload, unspecified: Secondary | ICD-10-CM | POA: Diagnosis not present

## 2017-11-14 DIAGNOSIS — N2581 Secondary hyperparathyroidism of renal origin: Secondary | ICD-10-CM | POA: Diagnosis not present

## 2017-11-14 DIAGNOSIS — D631 Anemia in chronic kidney disease: Secondary | ICD-10-CM | POA: Diagnosis not present

## 2017-11-14 DIAGNOSIS — I10 Essential (primary) hypertension: Secondary | ICD-10-CM | POA: Diagnosis not present

## 2017-11-14 DIAGNOSIS — T8249XD Other complication of vascular dialysis catheter, subsequent encounter: Secondary | ICD-10-CM | POA: Diagnosis not present

## 2017-11-14 DIAGNOSIS — N186 End stage renal disease: Secondary | ICD-10-CM | POA: Diagnosis not present

## 2017-11-14 DIAGNOSIS — D689 Coagulation defect, unspecified: Secondary | ICD-10-CM | POA: Diagnosis not present

## 2017-11-14 DIAGNOSIS — E1129 Type 2 diabetes mellitus with other diabetic kidney complication: Secondary | ICD-10-CM | POA: Diagnosis not present

## 2017-11-15 DIAGNOSIS — I6359 Cerebral infarction due to unspecified occlusion or stenosis of other cerebral artery: Secondary | ICD-10-CM | POA: Diagnosis not present

## 2017-11-16 DIAGNOSIS — D631 Anemia in chronic kidney disease: Secondary | ICD-10-CM | POA: Diagnosis not present

## 2017-11-16 DIAGNOSIS — D509 Iron deficiency anemia, unspecified: Secondary | ICD-10-CM | POA: Diagnosis not present

## 2017-11-16 DIAGNOSIS — I10 Essential (primary) hypertension: Secondary | ICD-10-CM | POA: Diagnosis not present

## 2017-11-16 DIAGNOSIS — E8770 Fluid overload, unspecified: Secondary | ICD-10-CM | POA: Diagnosis not present

## 2017-11-16 DIAGNOSIS — N186 End stage renal disease: Secondary | ICD-10-CM | POA: Diagnosis not present

## 2017-11-16 DIAGNOSIS — T8249XD Other complication of vascular dialysis catheter, subsequent encounter: Secondary | ICD-10-CM | POA: Diagnosis not present

## 2017-11-16 DIAGNOSIS — E1129 Type 2 diabetes mellitus with other diabetic kidney complication: Secondary | ICD-10-CM | POA: Diagnosis not present

## 2017-11-16 DIAGNOSIS — Z992 Dependence on renal dialysis: Secondary | ICD-10-CM | POA: Diagnosis not present

## 2017-11-16 DIAGNOSIS — N2581 Secondary hyperparathyroidism of renal origin: Secondary | ICD-10-CM | POA: Diagnosis not present

## 2017-11-16 DIAGNOSIS — D689 Coagulation defect, unspecified: Secondary | ICD-10-CM | POA: Diagnosis not present

## 2017-11-17 DIAGNOSIS — I6359 Cerebral infarction due to unspecified occlusion or stenosis of other cerebral artery: Secondary | ICD-10-CM | POA: Diagnosis not present

## 2017-11-18 DIAGNOSIS — D631 Anemia in chronic kidney disease: Secondary | ICD-10-CM | POA: Diagnosis not present

## 2017-11-18 DIAGNOSIS — D509 Iron deficiency anemia, unspecified: Secondary | ICD-10-CM | POA: Diagnosis not present

## 2017-11-18 DIAGNOSIS — E8770 Fluid overload, unspecified: Secondary | ICD-10-CM | POA: Diagnosis not present

## 2017-11-18 DIAGNOSIS — I10 Essential (primary) hypertension: Secondary | ICD-10-CM | POA: Diagnosis not present

## 2017-11-18 DIAGNOSIS — E1129 Type 2 diabetes mellitus with other diabetic kidney complication: Secondary | ICD-10-CM | POA: Diagnosis not present

## 2017-11-18 DIAGNOSIS — Z992 Dependence on renal dialysis: Secondary | ICD-10-CM | POA: Diagnosis not present

## 2017-11-18 DIAGNOSIS — N186 End stage renal disease: Secondary | ICD-10-CM | POA: Diagnosis not present

## 2017-11-18 DIAGNOSIS — N2581 Secondary hyperparathyroidism of renal origin: Secondary | ICD-10-CM | POA: Diagnosis not present

## 2017-11-18 DIAGNOSIS — T8249XD Other complication of vascular dialysis catheter, subsequent encounter: Secondary | ICD-10-CM | POA: Diagnosis not present

## 2017-11-18 DIAGNOSIS — D689 Coagulation defect, unspecified: Secondary | ICD-10-CM | POA: Diagnosis not present

## 2017-11-20 DIAGNOSIS — I6359 Cerebral infarction due to unspecified occlusion or stenosis of other cerebral artery: Secondary | ICD-10-CM | POA: Diagnosis not present

## 2017-11-21 DIAGNOSIS — E8770 Fluid overload, unspecified: Secondary | ICD-10-CM | POA: Diagnosis not present

## 2017-11-21 DIAGNOSIS — D689 Coagulation defect, unspecified: Secondary | ICD-10-CM | POA: Diagnosis not present

## 2017-11-21 DIAGNOSIS — I10 Essential (primary) hypertension: Secondary | ICD-10-CM | POA: Diagnosis not present

## 2017-11-21 DIAGNOSIS — T8249XD Other complication of vascular dialysis catheter, subsequent encounter: Secondary | ICD-10-CM | POA: Diagnosis not present

## 2017-11-21 DIAGNOSIS — N2581 Secondary hyperparathyroidism of renal origin: Secondary | ICD-10-CM | POA: Diagnosis not present

## 2017-11-21 DIAGNOSIS — Z992 Dependence on renal dialysis: Secondary | ICD-10-CM | POA: Diagnosis not present

## 2017-11-21 DIAGNOSIS — D631 Anemia in chronic kidney disease: Secondary | ICD-10-CM | POA: Diagnosis not present

## 2017-11-21 DIAGNOSIS — N186 End stage renal disease: Secondary | ICD-10-CM | POA: Diagnosis not present

## 2017-11-21 DIAGNOSIS — D509 Iron deficiency anemia, unspecified: Secondary | ICD-10-CM | POA: Diagnosis not present

## 2017-11-21 DIAGNOSIS — E1129 Type 2 diabetes mellitus with other diabetic kidney complication: Secondary | ICD-10-CM | POA: Diagnosis not present

## 2017-11-22 DIAGNOSIS — I739 Peripheral vascular disease, unspecified: Secondary | ICD-10-CM | POA: Diagnosis not present

## 2017-11-22 DIAGNOSIS — E1151 Type 2 diabetes mellitus with diabetic peripheral angiopathy without gangrene: Secondary | ICD-10-CM | POA: Diagnosis not present

## 2017-11-22 DIAGNOSIS — L603 Nail dystrophy: Secondary | ICD-10-CM | POA: Diagnosis not present

## 2017-11-23 DIAGNOSIS — N2581 Secondary hyperparathyroidism of renal origin: Secondary | ICD-10-CM | POA: Diagnosis not present

## 2017-11-23 DIAGNOSIS — E8770 Fluid overload, unspecified: Secondary | ICD-10-CM | POA: Diagnosis not present

## 2017-11-23 DIAGNOSIS — N186 End stage renal disease: Secondary | ICD-10-CM | POA: Diagnosis not present

## 2017-11-23 DIAGNOSIS — D509 Iron deficiency anemia, unspecified: Secondary | ICD-10-CM | POA: Diagnosis not present

## 2017-11-23 DIAGNOSIS — D631 Anemia in chronic kidney disease: Secondary | ICD-10-CM | POA: Diagnosis not present

## 2017-11-23 DIAGNOSIS — I10 Essential (primary) hypertension: Secondary | ICD-10-CM | POA: Diagnosis not present

## 2017-11-23 DIAGNOSIS — T8249XD Other complication of vascular dialysis catheter, subsequent encounter: Secondary | ICD-10-CM | POA: Diagnosis not present

## 2017-11-23 DIAGNOSIS — Z992 Dependence on renal dialysis: Secondary | ICD-10-CM | POA: Diagnosis not present

## 2017-11-23 DIAGNOSIS — E1129 Type 2 diabetes mellitus with other diabetic kidney complication: Secondary | ICD-10-CM | POA: Diagnosis not present

## 2017-11-23 DIAGNOSIS — D689 Coagulation defect, unspecified: Secondary | ICD-10-CM | POA: Diagnosis not present

## 2017-11-25 DIAGNOSIS — E8770 Fluid overload, unspecified: Secondary | ICD-10-CM | POA: Diagnosis not present

## 2017-11-25 DIAGNOSIS — Z992 Dependence on renal dialysis: Secondary | ICD-10-CM | POA: Diagnosis not present

## 2017-11-25 DIAGNOSIS — I10 Essential (primary) hypertension: Secondary | ICD-10-CM | POA: Diagnosis not present

## 2017-11-25 DIAGNOSIS — T8249XD Other complication of vascular dialysis catheter, subsequent encounter: Secondary | ICD-10-CM | POA: Diagnosis not present

## 2017-11-25 DIAGNOSIS — D689 Coagulation defect, unspecified: Secondary | ICD-10-CM | POA: Diagnosis not present

## 2017-11-25 DIAGNOSIS — D509 Iron deficiency anemia, unspecified: Secondary | ICD-10-CM | POA: Diagnosis not present

## 2017-11-25 DIAGNOSIS — E1129 Type 2 diabetes mellitus with other diabetic kidney complication: Secondary | ICD-10-CM | POA: Diagnosis not present

## 2017-11-25 DIAGNOSIS — N2581 Secondary hyperparathyroidism of renal origin: Secondary | ICD-10-CM | POA: Diagnosis not present

## 2017-11-25 DIAGNOSIS — D631 Anemia in chronic kidney disease: Secondary | ICD-10-CM | POA: Diagnosis not present

## 2017-11-25 DIAGNOSIS — N186 End stage renal disease: Secondary | ICD-10-CM | POA: Diagnosis not present

## 2017-11-27 DIAGNOSIS — I6359 Cerebral infarction due to unspecified occlusion or stenosis of other cerebral artery: Secondary | ICD-10-CM | POA: Diagnosis not present

## 2017-11-28 DIAGNOSIS — E8770 Fluid overload, unspecified: Secondary | ICD-10-CM | POA: Diagnosis not present

## 2017-11-28 DIAGNOSIS — D631 Anemia in chronic kidney disease: Secondary | ICD-10-CM | POA: Diagnosis not present

## 2017-11-28 DIAGNOSIS — I10 Essential (primary) hypertension: Secondary | ICD-10-CM | POA: Diagnosis not present

## 2017-11-28 DIAGNOSIS — N186 End stage renal disease: Secondary | ICD-10-CM | POA: Diagnosis not present

## 2017-11-28 DIAGNOSIS — D509 Iron deficiency anemia, unspecified: Secondary | ICD-10-CM | POA: Diagnosis not present

## 2017-11-28 DIAGNOSIS — Z992 Dependence on renal dialysis: Secondary | ICD-10-CM | POA: Diagnosis not present

## 2017-11-28 DIAGNOSIS — D689 Coagulation defect, unspecified: Secondary | ICD-10-CM | POA: Diagnosis not present

## 2017-11-28 DIAGNOSIS — E1129 Type 2 diabetes mellitus with other diabetic kidney complication: Secondary | ICD-10-CM | POA: Diagnosis not present

## 2017-11-28 DIAGNOSIS — T8249XD Other complication of vascular dialysis catheter, subsequent encounter: Secondary | ICD-10-CM | POA: Diagnosis not present

## 2017-11-28 DIAGNOSIS — N2581 Secondary hyperparathyroidism of renal origin: Secondary | ICD-10-CM | POA: Diagnosis not present

## 2017-11-29 DIAGNOSIS — I6359 Cerebral infarction due to unspecified occlusion or stenosis of other cerebral artery: Secondary | ICD-10-CM | POA: Diagnosis not present

## 2017-11-30 DIAGNOSIS — N2581 Secondary hyperparathyroidism of renal origin: Secondary | ICD-10-CM | POA: Diagnosis not present

## 2017-11-30 DIAGNOSIS — I10 Essential (primary) hypertension: Secondary | ICD-10-CM | POA: Diagnosis not present

## 2017-11-30 DIAGNOSIS — E8770 Fluid overload, unspecified: Secondary | ICD-10-CM | POA: Diagnosis not present

## 2017-11-30 DIAGNOSIS — D509 Iron deficiency anemia, unspecified: Secondary | ICD-10-CM | POA: Diagnosis not present

## 2017-11-30 DIAGNOSIS — D689 Coagulation defect, unspecified: Secondary | ICD-10-CM | POA: Diagnosis not present

## 2017-11-30 DIAGNOSIS — N186 End stage renal disease: Secondary | ICD-10-CM | POA: Diagnosis not present

## 2017-11-30 DIAGNOSIS — E1129 Type 2 diabetes mellitus with other diabetic kidney complication: Secondary | ICD-10-CM | POA: Diagnosis not present

## 2017-11-30 DIAGNOSIS — Z992 Dependence on renal dialysis: Secondary | ICD-10-CM | POA: Diagnosis not present

## 2017-11-30 DIAGNOSIS — T8249XD Other complication of vascular dialysis catheter, subsequent encounter: Secondary | ICD-10-CM | POA: Diagnosis not present

## 2017-11-30 DIAGNOSIS — D631 Anemia in chronic kidney disease: Secondary | ICD-10-CM | POA: Diagnosis not present

## 2017-12-01 DIAGNOSIS — I6359 Cerebral infarction due to unspecified occlusion or stenosis of other cerebral artery: Secondary | ICD-10-CM | POA: Diagnosis not present

## 2017-12-01 DIAGNOSIS — M50022 Cervical disc disorder at C5-C6 level with myelopathy: Secondary | ICD-10-CM | POA: Diagnosis not present

## 2017-12-01 DIAGNOSIS — I639 Cerebral infarction, unspecified: Secondary | ICD-10-CM | POA: Diagnosis not present

## 2017-12-02 DIAGNOSIS — Z992 Dependence on renal dialysis: Secondary | ICD-10-CM | POA: Diagnosis not present

## 2017-12-02 DIAGNOSIS — E1129 Type 2 diabetes mellitus with other diabetic kidney complication: Secondary | ICD-10-CM | POA: Diagnosis not present

## 2017-12-02 DIAGNOSIS — D631 Anemia in chronic kidney disease: Secondary | ICD-10-CM | POA: Diagnosis not present

## 2017-12-02 DIAGNOSIS — T8249XD Other complication of vascular dialysis catheter, subsequent encounter: Secondary | ICD-10-CM | POA: Diagnosis not present

## 2017-12-02 DIAGNOSIS — I10 Essential (primary) hypertension: Secondary | ICD-10-CM | POA: Diagnosis not present

## 2017-12-02 DIAGNOSIS — N2581 Secondary hyperparathyroidism of renal origin: Secondary | ICD-10-CM | POA: Diagnosis not present

## 2017-12-02 DIAGNOSIS — E8770 Fluid overload, unspecified: Secondary | ICD-10-CM | POA: Diagnosis not present

## 2017-12-02 DIAGNOSIS — D689 Coagulation defect, unspecified: Secondary | ICD-10-CM | POA: Diagnosis not present

## 2017-12-02 DIAGNOSIS — D509 Iron deficiency anemia, unspecified: Secondary | ICD-10-CM | POA: Diagnosis not present

## 2017-12-02 DIAGNOSIS — N186 End stage renal disease: Secondary | ICD-10-CM | POA: Diagnosis not present

## 2017-12-04 DIAGNOSIS — I6359 Cerebral infarction due to unspecified occlusion or stenosis of other cerebral artery: Secondary | ICD-10-CM | POA: Diagnosis not present

## 2017-12-05 DIAGNOSIS — E8770 Fluid overload, unspecified: Secondary | ICD-10-CM | POA: Diagnosis not present

## 2017-12-05 DIAGNOSIS — N2581 Secondary hyperparathyroidism of renal origin: Secondary | ICD-10-CM | POA: Diagnosis not present

## 2017-12-05 DIAGNOSIS — E1129 Type 2 diabetes mellitus with other diabetic kidney complication: Secondary | ICD-10-CM | POA: Diagnosis not present

## 2017-12-05 DIAGNOSIS — T8249XD Other complication of vascular dialysis catheter, subsequent encounter: Secondary | ICD-10-CM | POA: Diagnosis not present

## 2017-12-05 DIAGNOSIS — D509 Iron deficiency anemia, unspecified: Secondary | ICD-10-CM | POA: Diagnosis not present

## 2017-12-05 DIAGNOSIS — N186 End stage renal disease: Secondary | ICD-10-CM | POA: Diagnosis not present

## 2017-12-05 DIAGNOSIS — Z992 Dependence on renal dialysis: Secondary | ICD-10-CM | POA: Diagnosis not present

## 2017-12-05 DIAGNOSIS — D631 Anemia in chronic kidney disease: Secondary | ICD-10-CM | POA: Diagnosis not present

## 2017-12-05 DIAGNOSIS — D689 Coagulation defect, unspecified: Secondary | ICD-10-CM | POA: Diagnosis not present

## 2017-12-05 DIAGNOSIS — I10 Essential (primary) hypertension: Secondary | ICD-10-CM | POA: Diagnosis not present

## 2017-12-05 DIAGNOSIS — E1122 Type 2 diabetes mellitus with diabetic chronic kidney disease: Secondary | ICD-10-CM | POA: Diagnosis not present

## 2017-12-06 DIAGNOSIS — I5042 Chronic combined systolic (congestive) and diastolic (congestive) heart failure: Secondary | ICD-10-CM | POA: Diagnosis not present

## 2017-12-06 DIAGNOSIS — Z794 Long term (current) use of insulin: Secondary | ICD-10-CM | POA: Diagnosis not present

## 2017-12-06 DIAGNOSIS — D631 Anemia in chronic kidney disease: Secondary | ICD-10-CM | POA: Diagnosis not present

## 2017-12-06 DIAGNOSIS — I2510 Atherosclerotic heart disease of native coronary artery without angina pectoris: Secondary | ICD-10-CM | POA: Diagnosis not present

## 2017-12-06 DIAGNOSIS — Z7982 Long term (current) use of aspirin: Secondary | ICD-10-CM | POA: Diagnosis not present

## 2017-12-06 DIAGNOSIS — I132 Hypertensive heart and chronic kidney disease with heart failure and with stage 5 chronic kidney disease, or end stage renal disease: Secondary | ICD-10-CM | POA: Diagnosis not present

## 2017-12-06 DIAGNOSIS — Z992 Dependence on renal dialysis: Secondary | ICD-10-CM | POA: Diagnosis not present

## 2017-12-06 DIAGNOSIS — N186 End stage renal disease: Secondary | ICD-10-CM | POA: Diagnosis not present

## 2017-12-06 DIAGNOSIS — Z8673 Personal history of transient ischemic attack (TIA), and cerebral infarction without residual deficits: Secondary | ICD-10-CM | POA: Diagnosis not present

## 2017-12-06 DIAGNOSIS — E1122 Type 2 diabetes mellitus with diabetic chronic kidney disease: Secondary | ICD-10-CM | POA: Diagnosis not present

## 2017-12-07 DIAGNOSIS — D509 Iron deficiency anemia, unspecified: Secondary | ICD-10-CM | POA: Diagnosis not present

## 2017-12-07 DIAGNOSIS — N186 End stage renal disease: Secondary | ICD-10-CM | POA: Diagnosis not present

## 2017-12-07 DIAGNOSIS — R51 Headache: Secondary | ICD-10-CM | POA: Diagnosis not present

## 2017-12-07 DIAGNOSIS — N2581 Secondary hyperparathyroidism of renal origin: Secondary | ICD-10-CM | POA: Diagnosis not present

## 2017-12-07 DIAGNOSIS — D631 Anemia in chronic kidney disease: Secondary | ICD-10-CM | POA: Diagnosis not present

## 2017-12-07 DIAGNOSIS — D689 Coagulation defect, unspecified: Secondary | ICD-10-CM | POA: Diagnosis not present

## 2017-12-07 DIAGNOSIS — E1129 Type 2 diabetes mellitus with other diabetic kidney complication: Secondary | ICD-10-CM | POA: Diagnosis not present

## 2017-12-07 DIAGNOSIS — Z992 Dependence on renal dialysis: Secondary | ICD-10-CM | POA: Diagnosis not present

## 2017-12-07 DIAGNOSIS — T8249XD Other complication of vascular dialysis catheter, subsequent encounter: Secondary | ICD-10-CM | POA: Diagnosis not present

## 2017-12-08 DIAGNOSIS — I132 Hypertensive heart and chronic kidney disease with heart failure and with stage 5 chronic kidney disease, or end stage renal disease: Secondary | ICD-10-CM | POA: Diagnosis not present

## 2017-12-08 DIAGNOSIS — D631 Anemia in chronic kidney disease: Secondary | ICD-10-CM | POA: Diagnosis not present

## 2017-12-08 DIAGNOSIS — Z8673 Personal history of transient ischemic attack (TIA), and cerebral infarction without residual deficits: Secondary | ICD-10-CM | POA: Diagnosis not present

## 2017-12-08 DIAGNOSIS — E1122 Type 2 diabetes mellitus with diabetic chronic kidney disease: Secondary | ICD-10-CM | POA: Diagnosis not present

## 2017-12-08 DIAGNOSIS — I5042 Chronic combined systolic (congestive) and diastolic (congestive) heart failure: Secondary | ICD-10-CM | POA: Diagnosis not present

## 2017-12-08 DIAGNOSIS — Z992 Dependence on renal dialysis: Secondary | ICD-10-CM | POA: Diagnosis not present

## 2017-12-08 DIAGNOSIS — Z7982 Long term (current) use of aspirin: Secondary | ICD-10-CM | POA: Diagnosis not present

## 2017-12-08 DIAGNOSIS — I2510 Atherosclerotic heart disease of native coronary artery without angina pectoris: Secondary | ICD-10-CM | POA: Diagnosis not present

## 2017-12-08 DIAGNOSIS — N186 End stage renal disease: Secondary | ICD-10-CM | POA: Diagnosis not present

## 2017-12-08 DIAGNOSIS — Z794 Long term (current) use of insulin: Secondary | ICD-10-CM | POA: Diagnosis not present

## 2017-12-09 DIAGNOSIS — Z992 Dependence on renal dialysis: Secondary | ICD-10-CM | POA: Diagnosis not present

## 2017-12-09 DIAGNOSIS — R51 Headache: Secondary | ICD-10-CM | POA: Diagnosis not present

## 2017-12-09 DIAGNOSIS — T8249XD Other complication of vascular dialysis catheter, subsequent encounter: Secondary | ICD-10-CM | POA: Diagnosis not present

## 2017-12-09 DIAGNOSIS — D509 Iron deficiency anemia, unspecified: Secondary | ICD-10-CM | POA: Diagnosis not present

## 2017-12-09 DIAGNOSIS — N186 End stage renal disease: Secondary | ICD-10-CM | POA: Diagnosis not present

## 2017-12-09 DIAGNOSIS — D631 Anemia in chronic kidney disease: Secondary | ICD-10-CM | POA: Diagnosis not present

## 2017-12-09 DIAGNOSIS — N2581 Secondary hyperparathyroidism of renal origin: Secondary | ICD-10-CM | POA: Diagnosis not present

## 2017-12-09 DIAGNOSIS — D689 Coagulation defect, unspecified: Secondary | ICD-10-CM | POA: Diagnosis not present

## 2017-12-09 DIAGNOSIS — E1129 Type 2 diabetes mellitus with other diabetic kidney complication: Secondary | ICD-10-CM | POA: Diagnosis not present

## 2017-12-11 DIAGNOSIS — Z7982 Long term (current) use of aspirin: Secondary | ICD-10-CM | POA: Diagnosis not present

## 2017-12-11 DIAGNOSIS — N186 End stage renal disease: Secondary | ICD-10-CM | POA: Diagnosis not present

## 2017-12-11 DIAGNOSIS — Z992 Dependence on renal dialysis: Secondary | ICD-10-CM | POA: Diagnosis not present

## 2017-12-11 DIAGNOSIS — M542 Cervicalgia: Secondary | ICD-10-CM | POA: Diagnosis not present

## 2017-12-11 DIAGNOSIS — I132 Hypertensive heart and chronic kidney disease with heart failure and with stage 5 chronic kidney disease, or end stage renal disease: Secondary | ICD-10-CM | POA: Diagnosis not present

## 2017-12-11 DIAGNOSIS — E1122 Type 2 diabetes mellitus with diabetic chronic kidney disease: Secondary | ICD-10-CM | POA: Diagnosis not present

## 2017-12-11 DIAGNOSIS — D631 Anemia in chronic kidney disease: Secondary | ICD-10-CM | POA: Diagnosis not present

## 2017-12-11 DIAGNOSIS — I2510 Atherosclerotic heart disease of native coronary artery without angina pectoris: Secondary | ICD-10-CM | POA: Diagnosis not present

## 2017-12-11 DIAGNOSIS — Z794 Long term (current) use of insulin: Secondary | ICD-10-CM | POA: Diagnosis not present

## 2017-12-11 DIAGNOSIS — I5042 Chronic combined systolic (congestive) and diastolic (congestive) heart failure: Secondary | ICD-10-CM | POA: Diagnosis not present

## 2017-12-11 DIAGNOSIS — Z8673 Personal history of transient ischemic attack (TIA), and cerebral infarction without residual deficits: Secondary | ICD-10-CM | POA: Diagnosis not present

## 2017-12-12 DIAGNOSIS — Z992 Dependence on renal dialysis: Secondary | ICD-10-CM | POA: Diagnosis not present

## 2017-12-12 DIAGNOSIS — E1129 Type 2 diabetes mellitus with other diabetic kidney complication: Secondary | ICD-10-CM | POA: Diagnosis not present

## 2017-12-12 DIAGNOSIS — D509 Iron deficiency anemia, unspecified: Secondary | ICD-10-CM | POA: Diagnosis not present

## 2017-12-12 DIAGNOSIS — D689 Coagulation defect, unspecified: Secondary | ICD-10-CM | POA: Diagnosis not present

## 2017-12-12 DIAGNOSIS — N186 End stage renal disease: Secondary | ICD-10-CM | POA: Diagnosis not present

## 2017-12-12 DIAGNOSIS — R51 Headache: Secondary | ICD-10-CM | POA: Diagnosis not present

## 2017-12-12 DIAGNOSIS — T8249XD Other complication of vascular dialysis catheter, subsequent encounter: Secondary | ICD-10-CM | POA: Diagnosis not present

## 2017-12-12 DIAGNOSIS — D631 Anemia in chronic kidney disease: Secondary | ICD-10-CM | POA: Diagnosis not present

## 2017-12-12 DIAGNOSIS — N2581 Secondary hyperparathyroidism of renal origin: Secondary | ICD-10-CM | POA: Diagnosis not present

## 2017-12-14 DIAGNOSIS — D631 Anemia in chronic kidney disease: Secondary | ICD-10-CM | POA: Diagnosis not present

## 2017-12-14 DIAGNOSIS — E1129 Type 2 diabetes mellitus with other diabetic kidney complication: Secondary | ICD-10-CM | POA: Diagnosis not present

## 2017-12-14 DIAGNOSIS — D689 Coagulation defect, unspecified: Secondary | ICD-10-CM | POA: Diagnosis not present

## 2017-12-14 DIAGNOSIS — N2581 Secondary hyperparathyroidism of renal origin: Secondary | ICD-10-CM | POA: Diagnosis not present

## 2017-12-14 DIAGNOSIS — T8249XD Other complication of vascular dialysis catheter, subsequent encounter: Secondary | ICD-10-CM | POA: Diagnosis not present

## 2017-12-14 DIAGNOSIS — R51 Headache: Secondary | ICD-10-CM | POA: Diagnosis not present

## 2017-12-14 DIAGNOSIS — N186 End stage renal disease: Secondary | ICD-10-CM | POA: Diagnosis not present

## 2017-12-14 DIAGNOSIS — D509 Iron deficiency anemia, unspecified: Secondary | ICD-10-CM | POA: Diagnosis not present

## 2017-12-14 DIAGNOSIS — Z992 Dependence on renal dialysis: Secondary | ICD-10-CM | POA: Diagnosis not present

## 2017-12-15 DIAGNOSIS — I132 Hypertensive heart and chronic kidney disease with heart failure and with stage 5 chronic kidney disease, or end stage renal disease: Secondary | ICD-10-CM | POA: Diagnosis not present

## 2017-12-15 DIAGNOSIS — Z7982 Long term (current) use of aspirin: Secondary | ICD-10-CM | POA: Diagnosis not present

## 2017-12-15 DIAGNOSIS — N186 End stage renal disease: Secondary | ICD-10-CM | POA: Diagnosis not present

## 2017-12-15 DIAGNOSIS — Z794 Long term (current) use of insulin: Secondary | ICD-10-CM | POA: Diagnosis not present

## 2017-12-15 DIAGNOSIS — Z992 Dependence on renal dialysis: Secondary | ICD-10-CM | POA: Diagnosis not present

## 2017-12-15 DIAGNOSIS — Z8673 Personal history of transient ischemic attack (TIA), and cerebral infarction without residual deficits: Secondary | ICD-10-CM | POA: Diagnosis not present

## 2017-12-15 DIAGNOSIS — I2510 Atherosclerotic heart disease of native coronary artery without angina pectoris: Secondary | ICD-10-CM | POA: Diagnosis not present

## 2017-12-15 DIAGNOSIS — I5042 Chronic combined systolic (congestive) and diastolic (congestive) heart failure: Secondary | ICD-10-CM | POA: Diagnosis not present

## 2017-12-15 DIAGNOSIS — E1122 Type 2 diabetes mellitus with diabetic chronic kidney disease: Secondary | ICD-10-CM | POA: Diagnosis not present

## 2017-12-15 DIAGNOSIS — D631 Anemia in chronic kidney disease: Secondary | ICD-10-CM | POA: Diagnosis not present

## 2017-12-16 DIAGNOSIS — D631 Anemia in chronic kidney disease: Secondary | ICD-10-CM | POA: Diagnosis not present

## 2017-12-16 DIAGNOSIS — Z992 Dependence on renal dialysis: Secondary | ICD-10-CM | POA: Diagnosis not present

## 2017-12-16 DIAGNOSIS — N186 End stage renal disease: Secondary | ICD-10-CM | POA: Diagnosis not present

## 2017-12-16 DIAGNOSIS — E1129 Type 2 diabetes mellitus with other diabetic kidney complication: Secondary | ICD-10-CM | POA: Diagnosis not present

## 2017-12-16 DIAGNOSIS — N2581 Secondary hyperparathyroidism of renal origin: Secondary | ICD-10-CM | POA: Diagnosis not present

## 2017-12-16 DIAGNOSIS — D689 Coagulation defect, unspecified: Secondary | ICD-10-CM | POA: Diagnosis not present

## 2017-12-16 DIAGNOSIS — D509 Iron deficiency anemia, unspecified: Secondary | ICD-10-CM | POA: Diagnosis not present

## 2017-12-16 DIAGNOSIS — T8249XD Other complication of vascular dialysis catheter, subsequent encounter: Secondary | ICD-10-CM | POA: Diagnosis not present

## 2017-12-16 DIAGNOSIS — R51 Headache: Secondary | ICD-10-CM | POA: Diagnosis not present

## 2017-12-19 DIAGNOSIS — N2581 Secondary hyperparathyroidism of renal origin: Secondary | ICD-10-CM | POA: Diagnosis not present

## 2017-12-19 DIAGNOSIS — Z992 Dependence on renal dialysis: Secondary | ICD-10-CM | POA: Diagnosis not present

## 2017-12-19 DIAGNOSIS — E1129 Type 2 diabetes mellitus with other diabetic kidney complication: Secondary | ICD-10-CM | POA: Diagnosis not present

## 2017-12-19 DIAGNOSIS — N186 End stage renal disease: Secondary | ICD-10-CM | POA: Diagnosis not present

## 2017-12-19 DIAGNOSIS — R51 Headache: Secondary | ICD-10-CM | POA: Diagnosis not present

## 2017-12-19 DIAGNOSIS — T8249XD Other complication of vascular dialysis catheter, subsequent encounter: Secondary | ICD-10-CM | POA: Diagnosis not present

## 2017-12-19 DIAGNOSIS — D631 Anemia in chronic kidney disease: Secondary | ICD-10-CM | POA: Diagnosis not present

## 2017-12-19 DIAGNOSIS — D509 Iron deficiency anemia, unspecified: Secondary | ICD-10-CM | POA: Diagnosis not present

## 2017-12-19 DIAGNOSIS — D689 Coagulation defect, unspecified: Secondary | ICD-10-CM | POA: Diagnosis not present

## 2017-12-20 DIAGNOSIS — D631 Anemia in chronic kidney disease: Secondary | ICD-10-CM | POA: Diagnosis not present

## 2017-12-20 DIAGNOSIS — N186 End stage renal disease: Secondary | ICD-10-CM | POA: Diagnosis not present

## 2017-12-20 DIAGNOSIS — Z794 Long term (current) use of insulin: Secondary | ICD-10-CM | POA: Diagnosis not present

## 2017-12-20 DIAGNOSIS — Z7982 Long term (current) use of aspirin: Secondary | ICD-10-CM | POA: Diagnosis not present

## 2017-12-20 DIAGNOSIS — I5042 Chronic combined systolic (congestive) and diastolic (congestive) heart failure: Secondary | ICD-10-CM | POA: Diagnosis not present

## 2017-12-20 DIAGNOSIS — I2510 Atherosclerotic heart disease of native coronary artery without angina pectoris: Secondary | ICD-10-CM | POA: Diagnosis not present

## 2017-12-20 DIAGNOSIS — Z992 Dependence on renal dialysis: Secondary | ICD-10-CM | POA: Diagnosis not present

## 2017-12-20 DIAGNOSIS — Z8673 Personal history of transient ischemic attack (TIA), and cerebral infarction without residual deficits: Secondary | ICD-10-CM | POA: Diagnosis not present

## 2017-12-20 DIAGNOSIS — E1122 Type 2 diabetes mellitus with diabetic chronic kidney disease: Secondary | ICD-10-CM | POA: Diagnosis not present

## 2017-12-20 DIAGNOSIS — I132 Hypertensive heart and chronic kidney disease with heart failure and with stage 5 chronic kidney disease, or end stage renal disease: Secondary | ICD-10-CM | POA: Diagnosis not present

## 2017-12-21 DIAGNOSIS — R51 Headache: Secondary | ICD-10-CM | POA: Diagnosis not present

## 2017-12-21 DIAGNOSIS — D509 Iron deficiency anemia, unspecified: Secondary | ICD-10-CM | POA: Diagnosis not present

## 2017-12-21 DIAGNOSIS — D689 Coagulation defect, unspecified: Secondary | ICD-10-CM | POA: Diagnosis not present

## 2017-12-21 DIAGNOSIS — N2581 Secondary hyperparathyroidism of renal origin: Secondary | ICD-10-CM | POA: Diagnosis not present

## 2017-12-21 DIAGNOSIS — Z992 Dependence on renal dialysis: Secondary | ICD-10-CM | POA: Diagnosis not present

## 2017-12-21 DIAGNOSIS — E1129 Type 2 diabetes mellitus with other diabetic kidney complication: Secondary | ICD-10-CM | POA: Diagnosis not present

## 2017-12-21 DIAGNOSIS — D631 Anemia in chronic kidney disease: Secondary | ICD-10-CM | POA: Diagnosis not present

## 2017-12-21 DIAGNOSIS — N186 End stage renal disease: Secondary | ICD-10-CM | POA: Diagnosis not present

## 2017-12-21 DIAGNOSIS — T8249XD Other complication of vascular dialysis catheter, subsequent encounter: Secondary | ICD-10-CM | POA: Diagnosis not present

## 2017-12-23 DIAGNOSIS — D631 Anemia in chronic kidney disease: Secondary | ICD-10-CM | POA: Diagnosis not present

## 2017-12-23 DIAGNOSIS — R51 Headache: Secondary | ICD-10-CM | POA: Diagnosis not present

## 2017-12-23 DIAGNOSIS — N186 End stage renal disease: Secondary | ICD-10-CM | POA: Diagnosis not present

## 2017-12-23 DIAGNOSIS — D509 Iron deficiency anemia, unspecified: Secondary | ICD-10-CM | POA: Diagnosis not present

## 2017-12-23 DIAGNOSIS — N2581 Secondary hyperparathyroidism of renal origin: Secondary | ICD-10-CM | POA: Diagnosis not present

## 2017-12-23 DIAGNOSIS — E1129 Type 2 diabetes mellitus with other diabetic kidney complication: Secondary | ICD-10-CM | POA: Diagnosis not present

## 2017-12-23 DIAGNOSIS — Z992 Dependence on renal dialysis: Secondary | ICD-10-CM | POA: Diagnosis not present

## 2017-12-23 DIAGNOSIS — T8249XD Other complication of vascular dialysis catheter, subsequent encounter: Secondary | ICD-10-CM | POA: Diagnosis not present

## 2017-12-23 DIAGNOSIS — D689 Coagulation defect, unspecified: Secondary | ICD-10-CM | POA: Diagnosis not present

## 2017-12-26 DIAGNOSIS — D509 Iron deficiency anemia, unspecified: Secondary | ICD-10-CM | POA: Diagnosis not present

## 2017-12-26 DIAGNOSIS — N2581 Secondary hyperparathyroidism of renal origin: Secondary | ICD-10-CM | POA: Diagnosis not present

## 2017-12-26 DIAGNOSIS — N186 End stage renal disease: Secondary | ICD-10-CM | POA: Diagnosis not present

## 2017-12-26 DIAGNOSIS — D689 Coagulation defect, unspecified: Secondary | ICD-10-CM | POA: Diagnosis not present

## 2017-12-26 DIAGNOSIS — D631 Anemia in chronic kidney disease: Secondary | ICD-10-CM | POA: Diagnosis not present

## 2017-12-26 DIAGNOSIS — R51 Headache: Secondary | ICD-10-CM | POA: Diagnosis not present

## 2017-12-26 DIAGNOSIS — T8249XD Other complication of vascular dialysis catheter, subsequent encounter: Secondary | ICD-10-CM | POA: Diagnosis not present

## 2017-12-26 DIAGNOSIS — Z992 Dependence on renal dialysis: Secondary | ICD-10-CM | POA: Diagnosis not present

## 2017-12-26 DIAGNOSIS — E1129 Type 2 diabetes mellitus with other diabetic kidney complication: Secondary | ICD-10-CM | POA: Diagnosis not present

## 2017-12-28 DIAGNOSIS — D689 Coagulation defect, unspecified: Secondary | ICD-10-CM | POA: Diagnosis not present

## 2017-12-28 DIAGNOSIS — D509 Iron deficiency anemia, unspecified: Secondary | ICD-10-CM | POA: Diagnosis not present

## 2017-12-28 DIAGNOSIS — N186 End stage renal disease: Secondary | ICD-10-CM | POA: Diagnosis not present

## 2017-12-28 DIAGNOSIS — N2581 Secondary hyperparathyroidism of renal origin: Secondary | ICD-10-CM | POA: Diagnosis not present

## 2017-12-28 DIAGNOSIS — R51 Headache: Secondary | ICD-10-CM | POA: Diagnosis not present

## 2017-12-28 DIAGNOSIS — D631 Anemia in chronic kidney disease: Secondary | ICD-10-CM | POA: Diagnosis not present

## 2017-12-28 DIAGNOSIS — E1129 Type 2 diabetes mellitus with other diabetic kidney complication: Secondary | ICD-10-CM | POA: Diagnosis not present

## 2017-12-28 DIAGNOSIS — T8249XD Other complication of vascular dialysis catheter, subsequent encounter: Secondary | ICD-10-CM | POA: Diagnosis not present

## 2017-12-28 DIAGNOSIS — Z992 Dependence on renal dialysis: Secondary | ICD-10-CM | POA: Diagnosis not present

## 2017-12-30 DIAGNOSIS — D689 Coagulation defect, unspecified: Secondary | ICD-10-CM | POA: Diagnosis not present

## 2017-12-30 DIAGNOSIS — T8249XD Other complication of vascular dialysis catheter, subsequent encounter: Secondary | ICD-10-CM | POA: Diagnosis not present

## 2017-12-30 DIAGNOSIS — N2581 Secondary hyperparathyroidism of renal origin: Secondary | ICD-10-CM | POA: Diagnosis not present

## 2017-12-30 DIAGNOSIS — R51 Headache: Secondary | ICD-10-CM | POA: Diagnosis not present

## 2017-12-30 DIAGNOSIS — E1129 Type 2 diabetes mellitus with other diabetic kidney complication: Secondary | ICD-10-CM | POA: Diagnosis not present

## 2017-12-30 DIAGNOSIS — D509 Iron deficiency anemia, unspecified: Secondary | ICD-10-CM | POA: Diagnosis not present

## 2017-12-30 DIAGNOSIS — D631 Anemia in chronic kidney disease: Secondary | ICD-10-CM | POA: Diagnosis not present

## 2017-12-30 DIAGNOSIS — N186 End stage renal disease: Secondary | ICD-10-CM | POA: Diagnosis not present

## 2017-12-30 DIAGNOSIS — Z992 Dependence on renal dialysis: Secondary | ICD-10-CM | POA: Diagnosis not present

## 2018-01-02 DIAGNOSIS — R51 Headache: Secondary | ICD-10-CM | POA: Diagnosis not present

## 2018-01-02 DIAGNOSIS — N186 End stage renal disease: Secondary | ICD-10-CM | POA: Diagnosis not present

## 2018-01-02 DIAGNOSIS — Z992 Dependence on renal dialysis: Secondary | ICD-10-CM | POA: Diagnosis not present

## 2018-01-02 DIAGNOSIS — D631 Anemia in chronic kidney disease: Secondary | ICD-10-CM | POA: Diagnosis not present

## 2018-01-02 DIAGNOSIS — N2581 Secondary hyperparathyroidism of renal origin: Secondary | ICD-10-CM | POA: Diagnosis not present

## 2018-01-02 DIAGNOSIS — E1129 Type 2 diabetes mellitus with other diabetic kidney complication: Secondary | ICD-10-CM | POA: Diagnosis not present

## 2018-01-02 DIAGNOSIS — D689 Coagulation defect, unspecified: Secondary | ICD-10-CM | POA: Diagnosis not present

## 2018-01-02 DIAGNOSIS — T8249XD Other complication of vascular dialysis catheter, subsequent encounter: Secondary | ICD-10-CM | POA: Diagnosis not present

## 2018-01-02 DIAGNOSIS — D509 Iron deficiency anemia, unspecified: Secondary | ICD-10-CM | POA: Diagnosis not present

## 2018-01-04 DIAGNOSIS — N186 End stage renal disease: Secondary | ICD-10-CM | POA: Diagnosis not present

## 2018-01-04 DIAGNOSIS — T8249XD Other complication of vascular dialysis catheter, subsequent encounter: Secondary | ICD-10-CM | POA: Diagnosis not present

## 2018-01-04 DIAGNOSIS — D509 Iron deficiency anemia, unspecified: Secondary | ICD-10-CM | POA: Diagnosis not present

## 2018-01-04 DIAGNOSIS — Z992 Dependence on renal dialysis: Secondary | ICD-10-CM | POA: Diagnosis not present

## 2018-01-04 DIAGNOSIS — D689 Coagulation defect, unspecified: Secondary | ICD-10-CM | POA: Diagnosis not present

## 2018-01-04 DIAGNOSIS — N2581 Secondary hyperparathyroidism of renal origin: Secondary | ICD-10-CM | POA: Diagnosis not present

## 2018-01-04 DIAGNOSIS — D631 Anemia in chronic kidney disease: Secondary | ICD-10-CM | POA: Diagnosis not present

## 2018-01-04 DIAGNOSIS — E1129 Type 2 diabetes mellitus with other diabetic kidney complication: Secondary | ICD-10-CM | POA: Diagnosis not present

## 2018-01-04 DIAGNOSIS — R51 Headache: Secondary | ICD-10-CM | POA: Diagnosis not present

## 2018-01-05 DIAGNOSIS — E1122 Type 2 diabetes mellitus with diabetic chronic kidney disease: Secondary | ICD-10-CM | POA: Diagnosis not present

## 2018-01-05 DIAGNOSIS — N186 End stage renal disease: Secondary | ICD-10-CM | POA: Diagnosis not present

## 2018-01-05 DIAGNOSIS — Z992 Dependence on renal dialysis: Secondary | ICD-10-CM | POA: Diagnosis not present

## 2018-01-06 DIAGNOSIS — D631 Anemia in chronic kidney disease: Secondary | ICD-10-CM | POA: Diagnosis not present

## 2018-01-06 DIAGNOSIS — Z992 Dependence on renal dialysis: Secondary | ICD-10-CM | POA: Diagnosis not present

## 2018-01-06 DIAGNOSIS — N2581 Secondary hyperparathyroidism of renal origin: Secondary | ICD-10-CM | POA: Diagnosis not present

## 2018-01-06 DIAGNOSIS — D689 Coagulation defect, unspecified: Secondary | ICD-10-CM | POA: Diagnosis not present

## 2018-01-06 DIAGNOSIS — N186 End stage renal disease: Secondary | ICD-10-CM | POA: Diagnosis not present

## 2018-01-06 DIAGNOSIS — L299 Pruritus, unspecified: Secondary | ICD-10-CM | POA: Diagnosis not present

## 2018-01-06 DIAGNOSIS — D509 Iron deficiency anemia, unspecified: Secondary | ICD-10-CM | POA: Diagnosis not present

## 2018-01-06 DIAGNOSIS — T8249XD Other complication of vascular dialysis catheter, subsequent encounter: Secondary | ICD-10-CM | POA: Diagnosis not present

## 2018-01-06 DIAGNOSIS — E1129 Type 2 diabetes mellitus with other diabetic kidney complication: Secondary | ICD-10-CM | POA: Diagnosis not present

## 2018-01-08 DIAGNOSIS — Z452 Encounter for adjustment and management of vascular access device: Secondary | ICD-10-CM | POA: Diagnosis not present

## 2018-01-09 DIAGNOSIS — N186 End stage renal disease: Secondary | ICD-10-CM | POA: Diagnosis not present

## 2018-01-09 DIAGNOSIS — L299 Pruritus, unspecified: Secondary | ICD-10-CM | POA: Diagnosis not present

## 2018-01-09 DIAGNOSIS — M542 Cervicalgia: Secondary | ICD-10-CM | POA: Diagnosis not present

## 2018-01-09 DIAGNOSIS — Z992 Dependence on renal dialysis: Secondary | ICD-10-CM | POA: Diagnosis not present

## 2018-01-09 DIAGNOSIS — D631 Anemia in chronic kidney disease: Secondary | ICD-10-CM | POA: Diagnosis not present

## 2018-01-09 DIAGNOSIS — E1129 Type 2 diabetes mellitus with other diabetic kidney complication: Secondary | ICD-10-CM | POA: Diagnosis not present

## 2018-01-09 DIAGNOSIS — D689 Coagulation defect, unspecified: Secondary | ICD-10-CM | POA: Diagnosis not present

## 2018-01-09 DIAGNOSIS — N2581 Secondary hyperparathyroidism of renal origin: Secondary | ICD-10-CM | POA: Diagnosis not present

## 2018-01-09 DIAGNOSIS — D509 Iron deficiency anemia, unspecified: Secondary | ICD-10-CM | POA: Diagnosis not present

## 2018-01-09 DIAGNOSIS — T8249XD Other complication of vascular dialysis catheter, subsequent encounter: Secondary | ICD-10-CM | POA: Diagnosis not present

## 2018-01-11 DIAGNOSIS — N2581 Secondary hyperparathyroidism of renal origin: Secondary | ICD-10-CM | POA: Diagnosis not present

## 2018-01-11 DIAGNOSIS — D689 Coagulation defect, unspecified: Secondary | ICD-10-CM | POA: Diagnosis not present

## 2018-01-11 DIAGNOSIS — E1129 Type 2 diabetes mellitus with other diabetic kidney complication: Secondary | ICD-10-CM | POA: Diagnosis not present

## 2018-01-11 DIAGNOSIS — N186 End stage renal disease: Secondary | ICD-10-CM | POA: Diagnosis not present

## 2018-01-11 DIAGNOSIS — T8249XD Other complication of vascular dialysis catheter, subsequent encounter: Secondary | ICD-10-CM | POA: Diagnosis not present

## 2018-01-11 DIAGNOSIS — D631 Anemia in chronic kidney disease: Secondary | ICD-10-CM | POA: Diagnosis not present

## 2018-01-11 DIAGNOSIS — D509 Iron deficiency anemia, unspecified: Secondary | ICD-10-CM | POA: Diagnosis not present

## 2018-01-11 DIAGNOSIS — Z992 Dependence on renal dialysis: Secondary | ICD-10-CM | POA: Diagnosis not present

## 2018-01-11 DIAGNOSIS — L299 Pruritus, unspecified: Secondary | ICD-10-CM | POA: Diagnosis not present

## 2018-01-12 DIAGNOSIS — M50022 Cervical disc disorder at C5-C6 level with myelopathy: Secondary | ICD-10-CM | POA: Diagnosis not present

## 2018-01-12 DIAGNOSIS — I639 Cerebral infarction, unspecified: Secondary | ICD-10-CM | POA: Diagnosis not present

## 2018-01-12 DIAGNOSIS — I10 Essential (primary) hypertension: Secondary | ICD-10-CM | POA: Diagnosis not present

## 2018-01-13 DIAGNOSIS — T8249XD Other complication of vascular dialysis catheter, subsequent encounter: Secondary | ICD-10-CM | POA: Diagnosis not present

## 2018-01-13 DIAGNOSIS — Z992 Dependence on renal dialysis: Secondary | ICD-10-CM | POA: Diagnosis not present

## 2018-01-13 DIAGNOSIS — N186 End stage renal disease: Secondary | ICD-10-CM | POA: Diagnosis not present

## 2018-01-13 DIAGNOSIS — D509 Iron deficiency anemia, unspecified: Secondary | ICD-10-CM | POA: Diagnosis not present

## 2018-01-13 DIAGNOSIS — D689 Coagulation defect, unspecified: Secondary | ICD-10-CM | POA: Diagnosis not present

## 2018-01-13 DIAGNOSIS — D631 Anemia in chronic kidney disease: Secondary | ICD-10-CM | POA: Diagnosis not present

## 2018-01-13 DIAGNOSIS — L299 Pruritus, unspecified: Secondary | ICD-10-CM | POA: Diagnosis not present

## 2018-01-13 DIAGNOSIS — E1129 Type 2 diabetes mellitus with other diabetic kidney complication: Secondary | ICD-10-CM | POA: Diagnosis not present

## 2018-01-13 DIAGNOSIS — N2581 Secondary hyperparathyroidism of renal origin: Secondary | ICD-10-CM | POA: Diagnosis not present

## 2018-01-16 DIAGNOSIS — E1129 Type 2 diabetes mellitus with other diabetic kidney complication: Secondary | ICD-10-CM | POA: Diagnosis not present

## 2018-01-16 DIAGNOSIS — L299 Pruritus, unspecified: Secondary | ICD-10-CM | POA: Diagnosis not present

## 2018-01-16 DIAGNOSIS — N186 End stage renal disease: Secondary | ICD-10-CM | POA: Diagnosis not present

## 2018-01-16 DIAGNOSIS — T8249XD Other complication of vascular dialysis catheter, subsequent encounter: Secondary | ICD-10-CM | POA: Diagnosis not present

## 2018-01-16 DIAGNOSIS — Z992 Dependence on renal dialysis: Secondary | ICD-10-CM | POA: Diagnosis not present

## 2018-01-16 DIAGNOSIS — D509 Iron deficiency anemia, unspecified: Secondary | ICD-10-CM | POA: Diagnosis not present

## 2018-01-16 DIAGNOSIS — D689 Coagulation defect, unspecified: Secondary | ICD-10-CM | POA: Diagnosis not present

## 2018-01-16 DIAGNOSIS — D631 Anemia in chronic kidney disease: Secondary | ICD-10-CM | POA: Diagnosis not present

## 2018-01-16 DIAGNOSIS — N2581 Secondary hyperparathyroidism of renal origin: Secondary | ICD-10-CM | POA: Diagnosis not present

## 2018-01-18 DIAGNOSIS — D509 Iron deficiency anemia, unspecified: Secondary | ICD-10-CM | POA: Diagnosis not present

## 2018-01-18 DIAGNOSIS — L299 Pruritus, unspecified: Secondary | ICD-10-CM | POA: Diagnosis not present

## 2018-01-18 DIAGNOSIS — Z992 Dependence on renal dialysis: Secondary | ICD-10-CM | POA: Diagnosis not present

## 2018-01-18 DIAGNOSIS — T8249XD Other complication of vascular dialysis catheter, subsequent encounter: Secondary | ICD-10-CM | POA: Diagnosis not present

## 2018-01-18 DIAGNOSIS — N186 End stage renal disease: Secondary | ICD-10-CM | POA: Diagnosis not present

## 2018-01-18 DIAGNOSIS — E1129 Type 2 diabetes mellitus with other diabetic kidney complication: Secondary | ICD-10-CM | POA: Diagnosis not present

## 2018-01-18 DIAGNOSIS — N2581 Secondary hyperparathyroidism of renal origin: Secondary | ICD-10-CM | POA: Diagnosis not present

## 2018-01-18 DIAGNOSIS — D631 Anemia in chronic kidney disease: Secondary | ICD-10-CM | POA: Diagnosis not present

## 2018-01-18 DIAGNOSIS — D689 Coagulation defect, unspecified: Secondary | ICD-10-CM | POA: Diagnosis not present

## 2018-01-20 DIAGNOSIS — L299 Pruritus, unspecified: Secondary | ICD-10-CM | POA: Diagnosis not present

## 2018-01-20 DIAGNOSIS — N2581 Secondary hyperparathyroidism of renal origin: Secondary | ICD-10-CM | POA: Diagnosis not present

## 2018-01-20 DIAGNOSIS — Z992 Dependence on renal dialysis: Secondary | ICD-10-CM | POA: Diagnosis not present

## 2018-01-20 DIAGNOSIS — N186 End stage renal disease: Secondary | ICD-10-CM | POA: Diagnosis not present

## 2018-01-20 DIAGNOSIS — D509 Iron deficiency anemia, unspecified: Secondary | ICD-10-CM | POA: Diagnosis not present

## 2018-01-20 DIAGNOSIS — E1129 Type 2 diabetes mellitus with other diabetic kidney complication: Secondary | ICD-10-CM | POA: Diagnosis not present

## 2018-01-20 DIAGNOSIS — D689 Coagulation defect, unspecified: Secondary | ICD-10-CM | POA: Diagnosis not present

## 2018-01-20 DIAGNOSIS — D631 Anemia in chronic kidney disease: Secondary | ICD-10-CM | POA: Diagnosis not present

## 2018-01-20 DIAGNOSIS — T8249XD Other complication of vascular dialysis catheter, subsequent encounter: Secondary | ICD-10-CM | POA: Diagnosis not present

## 2018-01-23 DIAGNOSIS — L299 Pruritus, unspecified: Secondary | ICD-10-CM | POA: Diagnosis not present

## 2018-01-23 DIAGNOSIS — T8249XD Other complication of vascular dialysis catheter, subsequent encounter: Secondary | ICD-10-CM | POA: Diagnosis not present

## 2018-01-23 DIAGNOSIS — D631 Anemia in chronic kidney disease: Secondary | ICD-10-CM | POA: Diagnosis not present

## 2018-01-23 DIAGNOSIS — D689 Coagulation defect, unspecified: Secondary | ICD-10-CM | POA: Diagnosis not present

## 2018-01-23 DIAGNOSIS — N2581 Secondary hyperparathyroidism of renal origin: Secondary | ICD-10-CM | POA: Diagnosis not present

## 2018-01-23 DIAGNOSIS — Z992 Dependence on renal dialysis: Secondary | ICD-10-CM | POA: Diagnosis not present

## 2018-01-23 DIAGNOSIS — N186 End stage renal disease: Secondary | ICD-10-CM | POA: Diagnosis not present

## 2018-01-23 DIAGNOSIS — E1129 Type 2 diabetes mellitus with other diabetic kidney complication: Secondary | ICD-10-CM | POA: Diagnosis not present

## 2018-01-23 DIAGNOSIS — D509 Iron deficiency anemia, unspecified: Secondary | ICD-10-CM | POA: Diagnosis not present

## 2018-01-25 DIAGNOSIS — D631 Anemia in chronic kidney disease: Secondary | ICD-10-CM | POA: Diagnosis not present

## 2018-01-25 DIAGNOSIS — Z992 Dependence on renal dialysis: Secondary | ICD-10-CM | POA: Diagnosis not present

## 2018-01-25 DIAGNOSIS — N186 End stage renal disease: Secondary | ICD-10-CM | POA: Diagnosis not present

## 2018-01-25 DIAGNOSIS — D509 Iron deficiency anemia, unspecified: Secondary | ICD-10-CM | POA: Diagnosis not present

## 2018-01-25 DIAGNOSIS — L299 Pruritus, unspecified: Secondary | ICD-10-CM | POA: Diagnosis not present

## 2018-01-25 DIAGNOSIS — D689 Coagulation defect, unspecified: Secondary | ICD-10-CM | POA: Diagnosis not present

## 2018-01-25 DIAGNOSIS — N2581 Secondary hyperparathyroidism of renal origin: Secondary | ICD-10-CM | POA: Diagnosis not present

## 2018-01-25 DIAGNOSIS — T8249XD Other complication of vascular dialysis catheter, subsequent encounter: Secondary | ICD-10-CM | POA: Diagnosis not present

## 2018-01-25 DIAGNOSIS — E1129 Type 2 diabetes mellitus with other diabetic kidney complication: Secondary | ICD-10-CM | POA: Diagnosis not present

## 2018-01-27 DIAGNOSIS — D689 Coagulation defect, unspecified: Secondary | ICD-10-CM | POA: Diagnosis not present

## 2018-01-27 DIAGNOSIS — E1129 Type 2 diabetes mellitus with other diabetic kidney complication: Secondary | ICD-10-CM | POA: Diagnosis not present

## 2018-01-27 DIAGNOSIS — N2581 Secondary hyperparathyroidism of renal origin: Secondary | ICD-10-CM | POA: Diagnosis not present

## 2018-01-27 DIAGNOSIS — T8249XD Other complication of vascular dialysis catheter, subsequent encounter: Secondary | ICD-10-CM | POA: Diagnosis not present

## 2018-01-27 DIAGNOSIS — L299 Pruritus, unspecified: Secondary | ICD-10-CM | POA: Diagnosis not present

## 2018-01-27 DIAGNOSIS — D509 Iron deficiency anemia, unspecified: Secondary | ICD-10-CM | POA: Diagnosis not present

## 2018-01-27 DIAGNOSIS — Z992 Dependence on renal dialysis: Secondary | ICD-10-CM | POA: Diagnosis not present

## 2018-01-27 DIAGNOSIS — D631 Anemia in chronic kidney disease: Secondary | ICD-10-CM | POA: Diagnosis not present

## 2018-01-27 DIAGNOSIS — N186 End stage renal disease: Secondary | ICD-10-CM | POA: Diagnosis not present

## 2018-01-29 DIAGNOSIS — H35033 Hypertensive retinopathy, bilateral: Secondary | ICD-10-CM | POA: Diagnosis not present

## 2018-01-29 DIAGNOSIS — H401134 Primary open-angle glaucoma, bilateral, indeterminate stage: Secondary | ICD-10-CM | POA: Diagnosis not present

## 2018-01-29 DIAGNOSIS — Z961 Presence of intraocular lens: Secondary | ICD-10-CM | POA: Diagnosis not present

## 2018-01-29 DIAGNOSIS — H47233 Glaucomatous optic atrophy, bilateral: Secondary | ICD-10-CM | POA: Diagnosis not present

## 2018-01-29 DIAGNOSIS — E113593 Type 2 diabetes mellitus with proliferative diabetic retinopathy without macular edema, bilateral: Secondary | ICD-10-CM | POA: Diagnosis not present

## 2018-01-30 DIAGNOSIS — D509 Iron deficiency anemia, unspecified: Secondary | ICD-10-CM | POA: Diagnosis not present

## 2018-01-30 DIAGNOSIS — L299 Pruritus, unspecified: Secondary | ICD-10-CM | POA: Diagnosis not present

## 2018-01-30 DIAGNOSIS — N2581 Secondary hyperparathyroidism of renal origin: Secondary | ICD-10-CM | POA: Diagnosis not present

## 2018-01-30 DIAGNOSIS — E1129 Type 2 diabetes mellitus with other diabetic kidney complication: Secondary | ICD-10-CM | POA: Diagnosis not present

## 2018-01-30 DIAGNOSIS — D689 Coagulation defect, unspecified: Secondary | ICD-10-CM | POA: Diagnosis not present

## 2018-01-30 DIAGNOSIS — T8249XD Other complication of vascular dialysis catheter, subsequent encounter: Secondary | ICD-10-CM | POA: Diagnosis not present

## 2018-01-30 DIAGNOSIS — Z992 Dependence on renal dialysis: Secondary | ICD-10-CM | POA: Diagnosis not present

## 2018-01-30 DIAGNOSIS — N186 End stage renal disease: Secondary | ICD-10-CM | POA: Diagnosis not present

## 2018-01-30 DIAGNOSIS — D631 Anemia in chronic kidney disease: Secondary | ICD-10-CM | POA: Diagnosis not present

## 2018-02-01 DIAGNOSIS — D509 Iron deficiency anemia, unspecified: Secondary | ICD-10-CM | POA: Diagnosis not present

## 2018-02-01 DIAGNOSIS — D689 Coagulation defect, unspecified: Secondary | ICD-10-CM | POA: Diagnosis not present

## 2018-02-01 DIAGNOSIS — E1129 Type 2 diabetes mellitus with other diabetic kidney complication: Secondary | ICD-10-CM | POA: Diagnosis not present

## 2018-02-01 DIAGNOSIS — D631 Anemia in chronic kidney disease: Secondary | ICD-10-CM | POA: Diagnosis not present

## 2018-02-01 DIAGNOSIS — N186 End stage renal disease: Secondary | ICD-10-CM | POA: Diagnosis not present

## 2018-02-01 DIAGNOSIS — T8249XD Other complication of vascular dialysis catheter, subsequent encounter: Secondary | ICD-10-CM | POA: Diagnosis not present

## 2018-02-01 DIAGNOSIS — L299 Pruritus, unspecified: Secondary | ICD-10-CM | POA: Diagnosis not present

## 2018-02-01 DIAGNOSIS — Z992 Dependence on renal dialysis: Secondary | ICD-10-CM | POA: Diagnosis not present

## 2018-02-01 DIAGNOSIS — N2581 Secondary hyperparathyroidism of renal origin: Secondary | ICD-10-CM | POA: Diagnosis not present

## 2018-02-03 DIAGNOSIS — L299 Pruritus, unspecified: Secondary | ICD-10-CM | POA: Diagnosis not present

## 2018-02-03 DIAGNOSIS — D509 Iron deficiency anemia, unspecified: Secondary | ICD-10-CM | POA: Diagnosis not present

## 2018-02-03 DIAGNOSIS — D631 Anemia in chronic kidney disease: Secondary | ICD-10-CM | POA: Diagnosis not present

## 2018-02-03 DIAGNOSIS — T8249XD Other complication of vascular dialysis catheter, subsequent encounter: Secondary | ICD-10-CM | POA: Diagnosis not present

## 2018-02-03 DIAGNOSIS — N2581 Secondary hyperparathyroidism of renal origin: Secondary | ICD-10-CM | POA: Diagnosis not present

## 2018-02-03 DIAGNOSIS — N186 End stage renal disease: Secondary | ICD-10-CM | POA: Diagnosis not present

## 2018-02-03 DIAGNOSIS — D689 Coagulation defect, unspecified: Secondary | ICD-10-CM | POA: Diagnosis not present

## 2018-02-03 DIAGNOSIS — Z992 Dependence on renal dialysis: Secondary | ICD-10-CM | POA: Diagnosis not present

## 2018-02-03 DIAGNOSIS — E1129 Type 2 diabetes mellitus with other diabetic kidney complication: Secondary | ICD-10-CM | POA: Diagnosis not present

## 2018-02-04 DIAGNOSIS — Z992 Dependence on renal dialysis: Secondary | ICD-10-CM | POA: Diagnosis not present

## 2018-02-04 DIAGNOSIS — E1122 Type 2 diabetes mellitus with diabetic chronic kidney disease: Secondary | ICD-10-CM | POA: Diagnosis not present

## 2018-02-04 DIAGNOSIS — N186 End stage renal disease: Secondary | ICD-10-CM | POA: Diagnosis not present

## 2018-02-05 DIAGNOSIS — E1129 Type 2 diabetes mellitus with other diabetic kidney complication: Secondary | ICD-10-CM | POA: Diagnosis not present

## 2018-02-05 DIAGNOSIS — D689 Coagulation defect, unspecified: Secondary | ICD-10-CM | POA: Diagnosis not present

## 2018-02-05 DIAGNOSIS — Z992 Dependence on renal dialysis: Secondary | ICD-10-CM | POA: Diagnosis not present

## 2018-02-05 DIAGNOSIS — D509 Iron deficiency anemia, unspecified: Secondary | ICD-10-CM | POA: Diagnosis not present

## 2018-02-05 DIAGNOSIS — N2581 Secondary hyperparathyroidism of renal origin: Secondary | ICD-10-CM | POA: Diagnosis not present

## 2018-02-05 DIAGNOSIS — N186 End stage renal disease: Secondary | ICD-10-CM | POA: Diagnosis not present

## 2018-02-05 DIAGNOSIS — R197 Diarrhea, unspecified: Secondary | ICD-10-CM | POA: Diagnosis not present

## 2018-02-06 DIAGNOSIS — N186 End stage renal disease: Secondary | ICD-10-CM | POA: Diagnosis not present

## 2018-02-06 DIAGNOSIS — E1129 Type 2 diabetes mellitus with other diabetic kidney complication: Secondary | ICD-10-CM | POA: Diagnosis not present

## 2018-02-06 DIAGNOSIS — R197 Diarrhea, unspecified: Secondary | ICD-10-CM | POA: Diagnosis not present

## 2018-02-06 DIAGNOSIS — D509 Iron deficiency anemia, unspecified: Secondary | ICD-10-CM | POA: Diagnosis not present

## 2018-02-06 DIAGNOSIS — N2581 Secondary hyperparathyroidism of renal origin: Secondary | ICD-10-CM | POA: Diagnosis not present

## 2018-02-06 DIAGNOSIS — Z992 Dependence on renal dialysis: Secondary | ICD-10-CM | POA: Diagnosis not present

## 2018-02-06 DIAGNOSIS — D689 Coagulation defect, unspecified: Secondary | ICD-10-CM | POA: Diagnosis not present

## 2018-02-07 ENCOUNTER — Encounter (INDEPENDENT_AMBULATORY_CARE_PROVIDER_SITE_OTHER): Payer: Medicare Other | Admitting: Ophthalmology

## 2018-02-08 DIAGNOSIS — N2581 Secondary hyperparathyroidism of renal origin: Secondary | ICD-10-CM | POA: Diagnosis not present

## 2018-02-08 DIAGNOSIS — Z992 Dependence on renal dialysis: Secondary | ICD-10-CM | POA: Diagnosis not present

## 2018-02-08 DIAGNOSIS — D509 Iron deficiency anemia, unspecified: Secondary | ICD-10-CM | POA: Diagnosis not present

## 2018-02-08 DIAGNOSIS — E1129 Type 2 diabetes mellitus with other diabetic kidney complication: Secondary | ICD-10-CM | POA: Diagnosis not present

## 2018-02-08 DIAGNOSIS — N186 End stage renal disease: Secondary | ICD-10-CM | POA: Diagnosis not present

## 2018-02-08 DIAGNOSIS — D689 Coagulation defect, unspecified: Secondary | ICD-10-CM | POA: Diagnosis not present

## 2018-02-08 DIAGNOSIS — R197 Diarrhea, unspecified: Secondary | ICD-10-CM | POA: Diagnosis not present

## 2018-02-10 DIAGNOSIS — Z992 Dependence on renal dialysis: Secondary | ICD-10-CM | POA: Diagnosis not present

## 2018-02-10 DIAGNOSIS — D509 Iron deficiency anemia, unspecified: Secondary | ICD-10-CM | POA: Diagnosis not present

## 2018-02-10 DIAGNOSIS — E1129 Type 2 diabetes mellitus with other diabetic kidney complication: Secondary | ICD-10-CM | POA: Diagnosis not present

## 2018-02-10 DIAGNOSIS — N186 End stage renal disease: Secondary | ICD-10-CM | POA: Diagnosis not present

## 2018-02-10 DIAGNOSIS — N2581 Secondary hyperparathyroidism of renal origin: Secondary | ICD-10-CM | POA: Diagnosis not present

## 2018-02-10 DIAGNOSIS — R197 Diarrhea, unspecified: Secondary | ICD-10-CM | POA: Diagnosis not present

## 2018-02-10 DIAGNOSIS — D689 Coagulation defect, unspecified: Secondary | ICD-10-CM | POA: Diagnosis not present

## 2018-02-13 DIAGNOSIS — R197 Diarrhea, unspecified: Secondary | ICD-10-CM | POA: Diagnosis not present

## 2018-02-13 DIAGNOSIS — Z992 Dependence on renal dialysis: Secondary | ICD-10-CM | POA: Diagnosis not present

## 2018-02-13 DIAGNOSIS — D689 Coagulation defect, unspecified: Secondary | ICD-10-CM | POA: Diagnosis not present

## 2018-02-13 DIAGNOSIS — N2581 Secondary hyperparathyroidism of renal origin: Secondary | ICD-10-CM | POA: Diagnosis not present

## 2018-02-13 DIAGNOSIS — N186 End stage renal disease: Secondary | ICD-10-CM | POA: Diagnosis not present

## 2018-02-13 DIAGNOSIS — E1129 Type 2 diabetes mellitus with other diabetic kidney complication: Secondary | ICD-10-CM | POA: Diagnosis not present

## 2018-02-13 DIAGNOSIS — D509 Iron deficiency anemia, unspecified: Secondary | ICD-10-CM | POA: Diagnosis not present

## 2018-02-15 DIAGNOSIS — Z992 Dependence on renal dialysis: Secondary | ICD-10-CM | POA: Diagnosis not present

## 2018-02-15 DIAGNOSIS — N186 End stage renal disease: Secondary | ICD-10-CM | POA: Diagnosis not present

## 2018-02-15 DIAGNOSIS — N2581 Secondary hyperparathyroidism of renal origin: Secondary | ICD-10-CM | POA: Diagnosis not present

## 2018-02-15 DIAGNOSIS — D509 Iron deficiency anemia, unspecified: Secondary | ICD-10-CM | POA: Diagnosis not present

## 2018-02-15 DIAGNOSIS — E1129 Type 2 diabetes mellitus with other diabetic kidney complication: Secondary | ICD-10-CM | POA: Diagnosis not present

## 2018-02-15 DIAGNOSIS — R197 Diarrhea, unspecified: Secondary | ICD-10-CM | POA: Diagnosis not present

## 2018-02-15 DIAGNOSIS — D689 Coagulation defect, unspecified: Secondary | ICD-10-CM | POA: Diagnosis not present

## 2018-02-17 DIAGNOSIS — N2581 Secondary hyperparathyroidism of renal origin: Secondary | ICD-10-CM | POA: Diagnosis not present

## 2018-02-17 DIAGNOSIS — Z992 Dependence on renal dialysis: Secondary | ICD-10-CM | POA: Diagnosis not present

## 2018-02-17 DIAGNOSIS — D509 Iron deficiency anemia, unspecified: Secondary | ICD-10-CM | POA: Diagnosis not present

## 2018-02-17 DIAGNOSIS — N186 End stage renal disease: Secondary | ICD-10-CM | POA: Diagnosis not present

## 2018-02-17 DIAGNOSIS — R197 Diarrhea, unspecified: Secondary | ICD-10-CM | POA: Diagnosis not present

## 2018-02-17 DIAGNOSIS — E1129 Type 2 diabetes mellitus with other diabetic kidney complication: Secondary | ICD-10-CM | POA: Diagnosis not present

## 2018-02-17 DIAGNOSIS — D689 Coagulation defect, unspecified: Secondary | ICD-10-CM | POA: Diagnosis not present

## 2018-02-20 DIAGNOSIS — Z992 Dependence on renal dialysis: Secondary | ICD-10-CM | POA: Diagnosis not present

## 2018-02-20 DIAGNOSIS — D689 Coagulation defect, unspecified: Secondary | ICD-10-CM | POA: Diagnosis not present

## 2018-02-20 DIAGNOSIS — R197 Diarrhea, unspecified: Secondary | ICD-10-CM | POA: Diagnosis not present

## 2018-02-20 DIAGNOSIS — D509 Iron deficiency anemia, unspecified: Secondary | ICD-10-CM | POA: Diagnosis not present

## 2018-02-20 DIAGNOSIS — N186 End stage renal disease: Secondary | ICD-10-CM | POA: Diagnosis not present

## 2018-02-20 DIAGNOSIS — N2581 Secondary hyperparathyroidism of renal origin: Secondary | ICD-10-CM | POA: Diagnosis not present

## 2018-02-20 DIAGNOSIS — E1129 Type 2 diabetes mellitus with other diabetic kidney complication: Secondary | ICD-10-CM | POA: Diagnosis not present

## 2018-02-22 DIAGNOSIS — Z992 Dependence on renal dialysis: Secondary | ICD-10-CM | POA: Diagnosis not present

## 2018-02-22 DIAGNOSIS — N186 End stage renal disease: Secondary | ICD-10-CM | POA: Diagnosis not present

## 2018-02-22 DIAGNOSIS — D689 Coagulation defect, unspecified: Secondary | ICD-10-CM | POA: Diagnosis not present

## 2018-02-22 DIAGNOSIS — R197 Diarrhea, unspecified: Secondary | ICD-10-CM | POA: Diagnosis not present

## 2018-02-22 DIAGNOSIS — E1129 Type 2 diabetes mellitus with other diabetic kidney complication: Secondary | ICD-10-CM | POA: Diagnosis not present

## 2018-02-22 DIAGNOSIS — D509 Iron deficiency anemia, unspecified: Secondary | ICD-10-CM | POA: Diagnosis not present

## 2018-02-22 DIAGNOSIS — E1142 Type 2 diabetes mellitus with diabetic polyneuropathy: Secondary | ICD-10-CM | POA: Diagnosis not present

## 2018-02-22 DIAGNOSIS — N2581 Secondary hyperparathyroidism of renal origin: Secondary | ICD-10-CM | POA: Diagnosis not present

## 2018-02-24 DIAGNOSIS — E1129 Type 2 diabetes mellitus with other diabetic kidney complication: Secondary | ICD-10-CM | POA: Diagnosis not present

## 2018-02-24 DIAGNOSIS — N186 End stage renal disease: Secondary | ICD-10-CM | POA: Diagnosis not present

## 2018-02-24 DIAGNOSIS — R197 Diarrhea, unspecified: Secondary | ICD-10-CM | POA: Diagnosis not present

## 2018-02-24 DIAGNOSIS — D689 Coagulation defect, unspecified: Secondary | ICD-10-CM | POA: Diagnosis not present

## 2018-02-24 DIAGNOSIS — Z992 Dependence on renal dialysis: Secondary | ICD-10-CM | POA: Diagnosis not present

## 2018-02-24 DIAGNOSIS — N2581 Secondary hyperparathyroidism of renal origin: Secondary | ICD-10-CM | POA: Diagnosis not present

## 2018-02-24 DIAGNOSIS — D509 Iron deficiency anemia, unspecified: Secondary | ICD-10-CM | POA: Diagnosis not present

## 2018-02-26 ENCOUNTER — Encounter (INDEPENDENT_AMBULATORY_CARE_PROVIDER_SITE_OTHER): Payer: Medicare Other | Admitting: Ophthalmology

## 2018-02-26 DIAGNOSIS — E113313 Type 2 diabetes mellitus with moderate nonproliferative diabetic retinopathy with macular edema, bilateral: Secondary | ICD-10-CM

## 2018-02-26 DIAGNOSIS — E11311 Type 2 diabetes mellitus with unspecified diabetic retinopathy with macular edema: Secondary | ICD-10-CM | POA: Diagnosis not present

## 2018-02-26 DIAGNOSIS — I10 Essential (primary) hypertension: Secondary | ICD-10-CM

## 2018-02-26 DIAGNOSIS — H35033 Hypertensive retinopathy, bilateral: Secondary | ICD-10-CM | POA: Diagnosis not present

## 2018-02-26 DIAGNOSIS — H43813 Vitreous degeneration, bilateral: Secondary | ICD-10-CM | POA: Diagnosis not present

## 2018-02-28 DIAGNOSIS — E1129 Type 2 diabetes mellitus with other diabetic kidney complication: Secondary | ICD-10-CM | POA: Diagnosis not present

## 2018-02-28 DIAGNOSIS — N186 End stage renal disease: Secondary | ICD-10-CM | POA: Diagnosis not present

## 2018-02-28 DIAGNOSIS — N2581 Secondary hyperparathyroidism of renal origin: Secondary | ICD-10-CM | POA: Diagnosis not present

## 2018-02-28 DIAGNOSIS — Z992 Dependence on renal dialysis: Secondary | ICD-10-CM | POA: Diagnosis not present

## 2018-02-28 DIAGNOSIS — D509 Iron deficiency anemia, unspecified: Secondary | ICD-10-CM | POA: Diagnosis not present

## 2018-02-28 DIAGNOSIS — R197 Diarrhea, unspecified: Secondary | ICD-10-CM | POA: Diagnosis not present

## 2018-02-28 DIAGNOSIS — D689 Coagulation defect, unspecified: Secondary | ICD-10-CM | POA: Diagnosis not present

## 2018-03-03 DIAGNOSIS — Z992 Dependence on renal dialysis: Secondary | ICD-10-CM | POA: Diagnosis not present

## 2018-03-03 DIAGNOSIS — N2581 Secondary hyperparathyroidism of renal origin: Secondary | ICD-10-CM | POA: Diagnosis not present

## 2018-03-03 DIAGNOSIS — N186 End stage renal disease: Secondary | ICD-10-CM | POA: Diagnosis not present

## 2018-03-03 DIAGNOSIS — D689 Coagulation defect, unspecified: Secondary | ICD-10-CM | POA: Diagnosis not present

## 2018-03-03 DIAGNOSIS — D509 Iron deficiency anemia, unspecified: Secondary | ICD-10-CM | POA: Diagnosis not present

## 2018-03-03 DIAGNOSIS — E1129 Type 2 diabetes mellitus with other diabetic kidney complication: Secondary | ICD-10-CM | POA: Diagnosis not present

## 2018-03-03 DIAGNOSIS — R197 Diarrhea, unspecified: Secondary | ICD-10-CM | POA: Diagnosis not present

## 2018-03-05 DIAGNOSIS — H401134 Primary open-angle glaucoma, bilateral, indeterminate stage: Secondary | ICD-10-CM | POA: Diagnosis not present

## 2018-03-05 DIAGNOSIS — H16143 Punctate keratitis, bilateral: Secondary | ICD-10-CM | POA: Diagnosis not present

## 2018-03-05 DIAGNOSIS — H04123 Dry eye syndrome of bilateral lacrimal glands: Secondary | ICD-10-CM | POA: Diagnosis not present

## 2018-03-05 DIAGNOSIS — H0100B Unspecified blepharitis left eye, upper and lower eyelids: Secondary | ICD-10-CM | POA: Diagnosis not present

## 2018-03-05 DIAGNOSIS — H0100A Unspecified blepharitis right eye, upper and lower eyelids: Secondary | ICD-10-CM | POA: Diagnosis not present

## 2018-03-06 DIAGNOSIS — D689 Coagulation defect, unspecified: Secondary | ICD-10-CM | POA: Diagnosis not present

## 2018-03-06 DIAGNOSIS — N186 End stage renal disease: Secondary | ICD-10-CM | POA: Diagnosis not present

## 2018-03-06 DIAGNOSIS — R197 Diarrhea, unspecified: Secondary | ICD-10-CM | POA: Diagnosis not present

## 2018-03-06 DIAGNOSIS — Z992 Dependence on renal dialysis: Secondary | ICD-10-CM | POA: Diagnosis not present

## 2018-03-06 DIAGNOSIS — D509 Iron deficiency anemia, unspecified: Secondary | ICD-10-CM | POA: Diagnosis not present

## 2018-03-06 DIAGNOSIS — E1129 Type 2 diabetes mellitus with other diabetic kidney complication: Secondary | ICD-10-CM | POA: Diagnosis not present

## 2018-03-06 DIAGNOSIS — N2581 Secondary hyperparathyroidism of renal origin: Secondary | ICD-10-CM | POA: Diagnosis not present

## 2018-03-07 DIAGNOSIS — E1122 Type 2 diabetes mellitus with diabetic chronic kidney disease: Secondary | ICD-10-CM | POA: Diagnosis not present

## 2018-03-07 DIAGNOSIS — Z992 Dependence on renal dialysis: Secondary | ICD-10-CM | POA: Diagnosis not present

## 2018-03-07 DIAGNOSIS — N186 End stage renal disease: Secondary | ICD-10-CM | POA: Diagnosis not present

## 2018-03-08 DIAGNOSIS — N186 End stage renal disease: Secondary | ICD-10-CM | POA: Diagnosis not present

## 2018-03-08 DIAGNOSIS — E1129 Type 2 diabetes mellitus with other diabetic kidney complication: Secondary | ICD-10-CM | POA: Diagnosis not present

## 2018-03-08 DIAGNOSIS — D631 Anemia in chronic kidney disease: Secondary | ICD-10-CM | POA: Diagnosis not present

## 2018-03-08 DIAGNOSIS — D689 Coagulation defect, unspecified: Secondary | ICD-10-CM | POA: Diagnosis not present

## 2018-03-08 DIAGNOSIS — N2581 Secondary hyperparathyroidism of renal origin: Secondary | ICD-10-CM | POA: Diagnosis not present

## 2018-03-08 DIAGNOSIS — Z992 Dependence on renal dialysis: Secondary | ICD-10-CM | POA: Diagnosis not present

## 2018-03-08 DIAGNOSIS — D509 Iron deficiency anemia, unspecified: Secondary | ICD-10-CM | POA: Diagnosis not present

## 2018-03-09 DIAGNOSIS — M5002 Cervical disc disorder with myelopathy, mid-cervical region, unspecified level: Secondary | ICD-10-CM | POA: Diagnosis not present

## 2018-03-09 DIAGNOSIS — I639 Cerebral infarction, unspecified: Secondary | ICD-10-CM | POA: Diagnosis not present

## 2018-03-09 DIAGNOSIS — M5481 Occipital neuralgia: Secondary | ICD-10-CM | POA: Diagnosis not present

## 2018-03-09 DIAGNOSIS — I10 Essential (primary) hypertension: Secondary | ICD-10-CM | POA: Diagnosis not present

## 2018-03-10 DIAGNOSIS — D509 Iron deficiency anemia, unspecified: Secondary | ICD-10-CM | POA: Diagnosis not present

## 2018-03-10 DIAGNOSIS — E1129 Type 2 diabetes mellitus with other diabetic kidney complication: Secondary | ICD-10-CM | POA: Diagnosis not present

## 2018-03-10 DIAGNOSIS — Z992 Dependence on renal dialysis: Secondary | ICD-10-CM | POA: Diagnosis not present

## 2018-03-10 DIAGNOSIS — N186 End stage renal disease: Secondary | ICD-10-CM | POA: Diagnosis not present

## 2018-03-10 DIAGNOSIS — D631 Anemia in chronic kidney disease: Secondary | ICD-10-CM | POA: Diagnosis not present

## 2018-03-10 DIAGNOSIS — D689 Coagulation defect, unspecified: Secondary | ICD-10-CM | POA: Diagnosis not present

## 2018-03-10 DIAGNOSIS — N2581 Secondary hyperparathyroidism of renal origin: Secondary | ICD-10-CM | POA: Diagnosis not present

## 2018-03-12 DIAGNOSIS — I871 Compression of vein: Secondary | ICD-10-CM | POA: Diagnosis not present

## 2018-03-12 DIAGNOSIS — I771 Stricture of artery: Secondary | ICD-10-CM | POA: Diagnosis not present

## 2018-03-12 DIAGNOSIS — N186 End stage renal disease: Secondary | ICD-10-CM | POA: Diagnosis not present

## 2018-03-12 DIAGNOSIS — Z992 Dependence on renal dialysis: Secondary | ICD-10-CM | POA: Diagnosis not present

## 2018-03-13 DIAGNOSIS — Z992 Dependence on renal dialysis: Secondary | ICD-10-CM | POA: Diagnosis not present

## 2018-03-13 DIAGNOSIS — E1129 Type 2 diabetes mellitus with other diabetic kidney complication: Secondary | ICD-10-CM | POA: Diagnosis not present

## 2018-03-13 DIAGNOSIS — D689 Coagulation defect, unspecified: Secondary | ICD-10-CM | POA: Diagnosis not present

## 2018-03-13 DIAGNOSIS — N2581 Secondary hyperparathyroidism of renal origin: Secondary | ICD-10-CM | POA: Diagnosis not present

## 2018-03-13 DIAGNOSIS — N186 End stage renal disease: Secondary | ICD-10-CM | POA: Diagnosis not present

## 2018-03-13 DIAGNOSIS — D631 Anemia in chronic kidney disease: Secondary | ICD-10-CM | POA: Diagnosis not present

## 2018-03-13 DIAGNOSIS — D509 Iron deficiency anemia, unspecified: Secondary | ICD-10-CM | POA: Diagnosis not present

## 2018-03-15 DIAGNOSIS — D631 Anemia in chronic kidney disease: Secondary | ICD-10-CM | POA: Diagnosis not present

## 2018-03-15 DIAGNOSIS — N186 End stage renal disease: Secondary | ICD-10-CM | POA: Diagnosis not present

## 2018-03-15 DIAGNOSIS — D689 Coagulation defect, unspecified: Secondary | ICD-10-CM | POA: Diagnosis not present

## 2018-03-15 DIAGNOSIS — Z992 Dependence on renal dialysis: Secondary | ICD-10-CM | POA: Diagnosis not present

## 2018-03-15 DIAGNOSIS — E1129 Type 2 diabetes mellitus with other diabetic kidney complication: Secondary | ICD-10-CM | POA: Diagnosis not present

## 2018-03-15 DIAGNOSIS — N2581 Secondary hyperparathyroidism of renal origin: Secondary | ICD-10-CM | POA: Diagnosis not present

## 2018-03-15 DIAGNOSIS — D509 Iron deficiency anemia, unspecified: Secondary | ICD-10-CM | POA: Diagnosis not present

## 2018-03-17 DIAGNOSIS — N186 End stage renal disease: Secondary | ICD-10-CM | POA: Diagnosis not present

## 2018-03-17 DIAGNOSIS — N2581 Secondary hyperparathyroidism of renal origin: Secondary | ICD-10-CM | POA: Diagnosis not present

## 2018-03-17 DIAGNOSIS — D631 Anemia in chronic kidney disease: Secondary | ICD-10-CM | POA: Diagnosis not present

## 2018-03-17 DIAGNOSIS — E1129 Type 2 diabetes mellitus with other diabetic kidney complication: Secondary | ICD-10-CM | POA: Diagnosis not present

## 2018-03-17 DIAGNOSIS — D689 Coagulation defect, unspecified: Secondary | ICD-10-CM | POA: Diagnosis not present

## 2018-03-17 DIAGNOSIS — D509 Iron deficiency anemia, unspecified: Secondary | ICD-10-CM | POA: Diagnosis not present

## 2018-03-17 DIAGNOSIS — Z992 Dependence on renal dialysis: Secondary | ICD-10-CM | POA: Diagnosis not present

## 2018-03-20 DIAGNOSIS — N186 End stage renal disease: Secondary | ICD-10-CM | POA: Diagnosis not present

## 2018-03-20 DIAGNOSIS — D509 Iron deficiency anemia, unspecified: Secondary | ICD-10-CM | POA: Diagnosis not present

## 2018-03-20 DIAGNOSIS — Z992 Dependence on renal dialysis: Secondary | ICD-10-CM | POA: Diagnosis not present

## 2018-03-20 DIAGNOSIS — N2581 Secondary hyperparathyroidism of renal origin: Secondary | ICD-10-CM | POA: Diagnosis not present

## 2018-03-20 DIAGNOSIS — D689 Coagulation defect, unspecified: Secondary | ICD-10-CM | POA: Diagnosis not present

## 2018-03-20 DIAGNOSIS — E1129 Type 2 diabetes mellitus with other diabetic kidney complication: Secondary | ICD-10-CM | POA: Diagnosis not present

## 2018-03-20 DIAGNOSIS — D631 Anemia in chronic kidney disease: Secondary | ICD-10-CM | POA: Diagnosis not present

## 2018-03-22 DIAGNOSIS — Z992 Dependence on renal dialysis: Secondary | ICD-10-CM | POA: Diagnosis not present

## 2018-03-22 DIAGNOSIS — N2581 Secondary hyperparathyroidism of renal origin: Secondary | ICD-10-CM | POA: Diagnosis not present

## 2018-03-22 DIAGNOSIS — D509 Iron deficiency anemia, unspecified: Secondary | ICD-10-CM | POA: Diagnosis not present

## 2018-03-22 DIAGNOSIS — D689 Coagulation defect, unspecified: Secondary | ICD-10-CM | POA: Diagnosis not present

## 2018-03-22 DIAGNOSIS — E1129 Type 2 diabetes mellitus with other diabetic kidney complication: Secondary | ICD-10-CM | POA: Diagnosis not present

## 2018-03-22 DIAGNOSIS — N186 End stage renal disease: Secondary | ICD-10-CM | POA: Diagnosis not present

## 2018-03-22 DIAGNOSIS — D631 Anemia in chronic kidney disease: Secondary | ICD-10-CM | POA: Diagnosis not present

## 2018-03-24 DIAGNOSIS — D631 Anemia in chronic kidney disease: Secondary | ICD-10-CM | POA: Diagnosis not present

## 2018-03-24 DIAGNOSIS — N2581 Secondary hyperparathyroidism of renal origin: Secondary | ICD-10-CM | POA: Diagnosis not present

## 2018-03-24 DIAGNOSIS — N186 End stage renal disease: Secondary | ICD-10-CM | POA: Diagnosis not present

## 2018-03-24 DIAGNOSIS — Z992 Dependence on renal dialysis: Secondary | ICD-10-CM | POA: Diagnosis not present

## 2018-03-24 DIAGNOSIS — D689 Coagulation defect, unspecified: Secondary | ICD-10-CM | POA: Diagnosis not present

## 2018-03-24 DIAGNOSIS — E1129 Type 2 diabetes mellitus with other diabetic kidney complication: Secondary | ICD-10-CM | POA: Diagnosis not present

## 2018-03-24 DIAGNOSIS — D509 Iron deficiency anemia, unspecified: Secondary | ICD-10-CM | POA: Diagnosis not present

## 2018-03-27 DIAGNOSIS — D631 Anemia in chronic kidney disease: Secondary | ICD-10-CM | POA: Diagnosis not present

## 2018-03-27 DIAGNOSIS — D509 Iron deficiency anemia, unspecified: Secondary | ICD-10-CM | POA: Diagnosis not present

## 2018-03-27 DIAGNOSIS — D689 Coagulation defect, unspecified: Secondary | ICD-10-CM | POA: Diagnosis not present

## 2018-03-27 DIAGNOSIS — E1129 Type 2 diabetes mellitus with other diabetic kidney complication: Secondary | ICD-10-CM | POA: Diagnosis not present

## 2018-03-27 DIAGNOSIS — Z992 Dependence on renal dialysis: Secondary | ICD-10-CM | POA: Diagnosis not present

## 2018-03-27 DIAGNOSIS — N2581 Secondary hyperparathyroidism of renal origin: Secondary | ICD-10-CM | POA: Diagnosis not present

## 2018-03-27 DIAGNOSIS — N186 End stage renal disease: Secondary | ICD-10-CM | POA: Diagnosis not present

## 2018-03-29 DIAGNOSIS — N186 End stage renal disease: Secondary | ICD-10-CM | POA: Diagnosis not present

## 2018-03-29 DIAGNOSIS — D631 Anemia in chronic kidney disease: Secondary | ICD-10-CM | POA: Diagnosis not present

## 2018-03-29 DIAGNOSIS — E1129 Type 2 diabetes mellitus with other diabetic kidney complication: Secondary | ICD-10-CM | POA: Diagnosis not present

## 2018-03-29 DIAGNOSIS — Z992 Dependence on renal dialysis: Secondary | ICD-10-CM | POA: Diagnosis not present

## 2018-03-29 DIAGNOSIS — N2581 Secondary hyperparathyroidism of renal origin: Secondary | ICD-10-CM | POA: Diagnosis not present

## 2018-03-29 DIAGNOSIS — D509 Iron deficiency anemia, unspecified: Secondary | ICD-10-CM | POA: Diagnosis not present

## 2018-03-29 DIAGNOSIS — D689 Coagulation defect, unspecified: Secondary | ICD-10-CM | POA: Diagnosis not present

## 2018-03-31 DIAGNOSIS — Z992 Dependence on renal dialysis: Secondary | ICD-10-CM | POA: Diagnosis not present

## 2018-03-31 DIAGNOSIS — D631 Anemia in chronic kidney disease: Secondary | ICD-10-CM | POA: Diagnosis not present

## 2018-03-31 DIAGNOSIS — N186 End stage renal disease: Secondary | ICD-10-CM | POA: Diagnosis not present

## 2018-03-31 DIAGNOSIS — N2581 Secondary hyperparathyroidism of renal origin: Secondary | ICD-10-CM | POA: Diagnosis not present

## 2018-03-31 DIAGNOSIS — E1129 Type 2 diabetes mellitus with other diabetic kidney complication: Secondary | ICD-10-CM | POA: Diagnosis not present

## 2018-03-31 DIAGNOSIS — D509 Iron deficiency anemia, unspecified: Secondary | ICD-10-CM | POA: Diagnosis not present

## 2018-03-31 DIAGNOSIS — D689 Coagulation defect, unspecified: Secondary | ICD-10-CM | POA: Diagnosis not present

## 2018-04-03 DIAGNOSIS — Z992 Dependence on renal dialysis: Secondary | ICD-10-CM | POA: Diagnosis not present

## 2018-04-03 DIAGNOSIS — N186 End stage renal disease: Secondary | ICD-10-CM | POA: Diagnosis not present

## 2018-04-03 DIAGNOSIS — D631 Anemia in chronic kidney disease: Secondary | ICD-10-CM | POA: Diagnosis not present

## 2018-04-03 DIAGNOSIS — N2581 Secondary hyperparathyroidism of renal origin: Secondary | ICD-10-CM | POA: Diagnosis not present

## 2018-04-03 DIAGNOSIS — D509 Iron deficiency anemia, unspecified: Secondary | ICD-10-CM | POA: Diagnosis not present

## 2018-04-03 DIAGNOSIS — D689 Coagulation defect, unspecified: Secondary | ICD-10-CM | POA: Diagnosis not present

## 2018-04-03 DIAGNOSIS — E1129 Type 2 diabetes mellitus with other diabetic kidney complication: Secondary | ICD-10-CM | POA: Diagnosis not present

## 2018-04-05 DIAGNOSIS — N186 End stage renal disease: Secondary | ICD-10-CM | POA: Diagnosis not present

## 2018-04-05 DIAGNOSIS — D509 Iron deficiency anemia, unspecified: Secondary | ICD-10-CM | POA: Diagnosis not present

## 2018-04-05 DIAGNOSIS — D689 Coagulation defect, unspecified: Secondary | ICD-10-CM | POA: Diagnosis not present

## 2018-04-05 DIAGNOSIS — N2581 Secondary hyperparathyroidism of renal origin: Secondary | ICD-10-CM | POA: Diagnosis not present

## 2018-04-05 DIAGNOSIS — D631 Anemia in chronic kidney disease: Secondary | ICD-10-CM | POA: Diagnosis not present

## 2018-04-05 DIAGNOSIS — Z992 Dependence on renal dialysis: Secondary | ICD-10-CM | POA: Diagnosis not present

## 2018-04-05 DIAGNOSIS — E1129 Type 2 diabetes mellitus with other diabetic kidney complication: Secondary | ICD-10-CM | POA: Diagnosis not present

## 2018-04-07 DIAGNOSIS — D509 Iron deficiency anemia, unspecified: Secondary | ICD-10-CM | POA: Diagnosis not present

## 2018-04-07 DIAGNOSIS — N2581 Secondary hyperparathyroidism of renal origin: Secondary | ICD-10-CM | POA: Diagnosis not present

## 2018-04-07 DIAGNOSIS — D689 Coagulation defect, unspecified: Secondary | ICD-10-CM | POA: Diagnosis not present

## 2018-04-07 DIAGNOSIS — N186 End stage renal disease: Secondary | ICD-10-CM | POA: Diagnosis not present

## 2018-04-07 DIAGNOSIS — E1129 Type 2 diabetes mellitus with other diabetic kidney complication: Secondary | ICD-10-CM | POA: Diagnosis not present

## 2018-04-07 DIAGNOSIS — Z992 Dependence on renal dialysis: Secondary | ICD-10-CM | POA: Diagnosis not present

## 2018-04-07 DIAGNOSIS — E1122 Type 2 diabetes mellitus with diabetic chronic kidney disease: Secondary | ICD-10-CM | POA: Diagnosis not present

## 2018-04-07 DIAGNOSIS — D631 Anemia in chronic kidney disease: Secondary | ICD-10-CM | POA: Diagnosis not present

## 2018-04-10 DIAGNOSIS — N2581 Secondary hyperparathyroidism of renal origin: Secondary | ICD-10-CM | POA: Diagnosis not present

## 2018-04-10 DIAGNOSIS — D631 Anemia in chronic kidney disease: Secondary | ICD-10-CM | POA: Diagnosis not present

## 2018-04-10 DIAGNOSIS — E1129 Type 2 diabetes mellitus with other diabetic kidney complication: Secondary | ICD-10-CM | POA: Diagnosis not present

## 2018-04-10 DIAGNOSIS — Z992 Dependence on renal dialysis: Secondary | ICD-10-CM | POA: Diagnosis not present

## 2018-04-10 DIAGNOSIS — Z23 Encounter for immunization: Secondary | ICD-10-CM | POA: Diagnosis not present

## 2018-04-10 DIAGNOSIS — N186 End stage renal disease: Secondary | ICD-10-CM | POA: Diagnosis not present

## 2018-04-10 DIAGNOSIS — D689 Coagulation defect, unspecified: Secondary | ICD-10-CM | POA: Diagnosis not present

## 2018-04-12 DIAGNOSIS — D631 Anemia in chronic kidney disease: Secondary | ICD-10-CM | POA: Diagnosis not present

## 2018-04-12 DIAGNOSIS — N186 End stage renal disease: Secondary | ICD-10-CM | POA: Diagnosis not present

## 2018-04-12 DIAGNOSIS — Z23 Encounter for immunization: Secondary | ICD-10-CM | POA: Diagnosis not present

## 2018-04-12 DIAGNOSIS — N2581 Secondary hyperparathyroidism of renal origin: Secondary | ICD-10-CM | POA: Diagnosis not present

## 2018-04-12 DIAGNOSIS — Z992 Dependence on renal dialysis: Secondary | ICD-10-CM | POA: Diagnosis not present

## 2018-04-12 DIAGNOSIS — D689 Coagulation defect, unspecified: Secondary | ICD-10-CM | POA: Diagnosis not present

## 2018-04-12 DIAGNOSIS — E1129 Type 2 diabetes mellitus with other diabetic kidney complication: Secondary | ICD-10-CM | POA: Diagnosis not present

## 2018-04-14 DIAGNOSIS — D631 Anemia in chronic kidney disease: Secondary | ICD-10-CM | POA: Diagnosis not present

## 2018-04-14 DIAGNOSIS — N186 End stage renal disease: Secondary | ICD-10-CM | POA: Diagnosis not present

## 2018-04-14 DIAGNOSIS — D689 Coagulation defect, unspecified: Secondary | ICD-10-CM | POA: Diagnosis not present

## 2018-04-14 DIAGNOSIS — N2581 Secondary hyperparathyroidism of renal origin: Secondary | ICD-10-CM | POA: Diagnosis not present

## 2018-04-14 DIAGNOSIS — Z992 Dependence on renal dialysis: Secondary | ICD-10-CM | POA: Diagnosis not present

## 2018-04-14 DIAGNOSIS — Z23 Encounter for immunization: Secondary | ICD-10-CM | POA: Diagnosis not present

## 2018-04-14 DIAGNOSIS — E1129 Type 2 diabetes mellitus with other diabetic kidney complication: Secondary | ICD-10-CM | POA: Diagnosis not present

## 2018-04-17 DIAGNOSIS — Z992 Dependence on renal dialysis: Secondary | ICD-10-CM | POA: Diagnosis not present

## 2018-04-17 DIAGNOSIS — N186 End stage renal disease: Secondary | ICD-10-CM | POA: Diagnosis not present

## 2018-04-17 DIAGNOSIS — N2581 Secondary hyperparathyroidism of renal origin: Secondary | ICD-10-CM | POA: Diagnosis not present

## 2018-04-17 DIAGNOSIS — Z23 Encounter for immunization: Secondary | ICD-10-CM | POA: Diagnosis not present

## 2018-04-17 DIAGNOSIS — D689 Coagulation defect, unspecified: Secondary | ICD-10-CM | POA: Diagnosis not present

## 2018-04-17 DIAGNOSIS — E1129 Type 2 diabetes mellitus with other diabetic kidney complication: Secondary | ICD-10-CM | POA: Diagnosis not present

## 2018-04-17 DIAGNOSIS — D631 Anemia in chronic kidney disease: Secondary | ICD-10-CM | POA: Diagnosis not present

## 2018-04-19 DIAGNOSIS — Z23 Encounter for immunization: Secondary | ICD-10-CM | POA: Diagnosis not present

## 2018-04-19 DIAGNOSIS — D631 Anemia in chronic kidney disease: Secondary | ICD-10-CM | POA: Diagnosis not present

## 2018-04-19 DIAGNOSIS — E1129 Type 2 diabetes mellitus with other diabetic kidney complication: Secondary | ICD-10-CM | POA: Diagnosis not present

## 2018-04-19 DIAGNOSIS — Z992 Dependence on renal dialysis: Secondary | ICD-10-CM | POA: Diagnosis not present

## 2018-04-19 DIAGNOSIS — N186 End stage renal disease: Secondary | ICD-10-CM | POA: Diagnosis not present

## 2018-04-19 DIAGNOSIS — D689 Coagulation defect, unspecified: Secondary | ICD-10-CM | POA: Diagnosis not present

## 2018-04-19 DIAGNOSIS — N2581 Secondary hyperparathyroidism of renal origin: Secondary | ICD-10-CM | POA: Diagnosis not present

## 2018-04-20 DIAGNOSIS — L97521 Non-pressure chronic ulcer of other part of left foot limited to breakdown of skin: Secondary | ICD-10-CM | POA: Diagnosis not present

## 2018-04-20 DIAGNOSIS — S92425A Nondisplaced fracture of distal phalanx of left great toe, initial encounter for closed fracture: Secondary | ICD-10-CM | POA: Diagnosis not present

## 2018-04-21 DIAGNOSIS — D631 Anemia in chronic kidney disease: Secondary | ICD-10-CM | POA: Diagnosis not present

## 2018-04-21 DIAGNOSIS — D689 Coagulation defect, unspecified: Secondary | ICD-10-CM | POA: Diagnosis not present

## 2018-04-21 DIAGNOSIS — Z992 Dependence on renal dialysis: Secondary | ICD-10-CM | POA: Diagnosis not present

## 2018-04-21 DIAGNOSIS — Z23 Encounter for immunization: Secondary | ICD-10-CM | POA: Diagnosis not present

## 2018-04-21 DIAGNOSIS — N2581 Secondary hyperparathyroidism of renal origin: Secondary | ICD-10-CM | POA: Diagnosis not present

## 2018-04-21 DIAGNOSIS — N186 End stage renal disease: Secondary | ICD-10-CM | POA: Diagnosis not present

## 2018-04-21 DIAGNOSIS — E1129 Type 2 diabetes mellitus with other diabetic kidney complication: Secondary | ICD-10-CM | POA: Diagnosis not present

## 2018-04-24 DIAGNOSIS — N2581 Secondary hyperparathyroidism of renal origin: Secondary | ICD-10-CM | POA: Diagnosis not present

## 2018-04-24 DIAGNOSIS — D631 Anemia in chronic kidney disease: Secondary | ICD-10-CM | POA: Diagnosis not present

## 2018-04-24 DIAGNOSIS — Z992 Dependence on renal dialysis: Secondary | ICD-10-CM | POA: Diagnosis not present

## 2018-04-24 DIAGNOSIS — D689 Coagulation defect, unspecified: Secondary | ICD-10-CM | POA: Diagnosis not present

## 2018-04-24 DIAGNOSIS — E1129 Type 2 diabetes mellitus with other diabetic kidney complication: Secondary | ICD-10-CM | POA: Diagnosis not present

## 2018-04-24 DIAGNOSIS — Z23 Encounter for immunization: Secondary | ICD-10-CM | POA: Diagnosis not present

## 2018-04-24 DIAGNOSIS — N186 End stage renal disease: Secondary | ICD-10-CM | POA: Diagnosis not present

## 2018-04-26 DIAGNOSIS — Z992 Dependence on renal dialysis: Secondary | ICD-10-CM | POA: Diagnosis not present

## 2018-04-26 DIAGNOSIS — N186 End stage renal disease: Secondary | ICD-10-CM | POA: Diagnosis not present

## 2018-04-26 DIAGNOSIS — N2581 Secondary hyperparathyroidism of renal origin: Secondary | ICD-10-CM | POA: Diagnosis not present

## 2018-04-26 DIAGNOSIS — D631 Anemia in chronic kidney disease: Secondary | ICD-10-CM | POA: Diagnosis not present

## 2018-04-26 DIAGNOSIS — D689 Coagulation defect, unspecified: Secondary | ICD-10-CM | POA: Diagnosis not present

## 2018-04-26 DIAGNOSIS — E1129 Type 2 diabetes mellitus with other diabetic kidney complication: Secondary | ICD-10-CM | POA: Diagnosis not present

## 2018-04-26 DIAGNOSIS — Z23 Encounter for immunization: Secondary | ICD-10-CM | POA: Diagnosis not present

## 2018-04-28 DIAGNOSIS — N186 End stage renal disease: Secondary | ICD-10-CM | POA: Diagnosis not present

## 2018-04-28 DIAGNOSIS — Z992 Dependence on renal dialysis: Secondary | ICD-10-CM | POA: Diagnosis not present

## 2018-04-28 DIAGNOSIS — E1129 Type 2 diabetes mellitus with other diabetic kidney complication: Secondary | ICD-10-CM | POA: Diagnosis not present

## 2018-04-28 DIAGNOSIS — N2581 Secondary hyperparathyroidism of renal origin: Secondary | ICD-10-CM | POA: Diagnosis not present

## 2018-04-28 DIAGNOSIS — Z23 Encounter for immunization: Secondary | ICD-10-CM | POA: Diagnosis not present

## 2018-04-28 DIAGNOSIS — D689 Coagulation defect, unspecified: Secondary | ICD-10-CM | POA: Diagnosis not present

## 2018-04-28 DIAGNOSIS — D631 Anemia in chronic kidney disease: Secondary | ICD-10-CM | POA: Diagnosis not present

## 2018-05-01 DIAGNOSIS — E1129 Type 2 diabetes mellitus with other diabetic kidney complication: Secondary | ICD-10-CM | POA: Diagnosis not present

## 2018-05-01 DIAGNOSIS — D631 Anemia in chronic kidney disease: Secondary | ICD-10-CM | POA: Diagnosis not present

## 2018-05-01 DIAGNOSIS — Z23 Encounter for immunization: Secondary | ICD-10-CM | POA: Diagnosis not present

## 2018-05-01 DIAGNOSIS — Z992 Dependence on renal dialysis: Secondary | ICD-10-CM | POA: Diagnosis not present

## 2018-05-01 DIAGNOSIS — D689 Coagulation defect, unspecified: Secondary | ICD-10-CM | POA: Diagnosis not present

## 2018-05-01 DIAGNOSIS — N2581 Secondary hyperparathyroidism of renal origin: Secondary | ICD-10-CM | POA: Diagnosis not present

## 2018-05-01 DIAGNOSIS — N186 End stage renal disease: Secondary | ICD-10-CM | POA: Diagnosis not present

## 2018-05-03 DIAGNOSIS — N186 End stage renal disease: Secondary | ICD-10-CM | POA: Diagnosis not present

## 2018-05-03 DIAGNOSIS — D689 Coagulation defect, unspecified: Secondary | ICD-10-CM | POA: Diagnosis not present

## 2018-05-03 DIAGNOSIS — Z23 Encounter for immunization: Secondary | ICD-10-CM | POA: Diagnosis not present

## 2018-05-03 DIAGNOSIS — Z992 Dependence on renal dialysis: Secondary | ICD-10-CM | POA: Diagnosis not present

## 2018-05-03 DIAGNOSIS — E1129 Type 2 diabetes mellitus with other diabetic kidney complication: Secondary | ICD-10-CM | POA: Diagnosis not present

## 2018-05-03 DIAGNOSIS — N2581 Secondary hyperparathyroidism of renal origin: Secondary | ICD-10-CM | POA: Diagnosis not present

## 2018-05-03 DIAGNOSIS — D631 Anemia in chronic kidney disease: Secondary | ICD-10-CM | POA: Diagnosis not present

## 2018-05-05 DIAGNOSIS — D689 Coagulation defect, unspecified: Secondary | ICD-10-CM | POA: Diagnosis not present

## 2018-05-05 DIAGNOSIS — Z992 Dependence on renal dialysis: Secondary | ICD-10-CM | POA: Diagnosis not present

## 2018-05-05 DIAGNOSIS — Z23 Encounter for immunization: Secondary | ICD-10-CM | POA: Diagnosis not present

## 2018-05-05 DIAGNOSIS — N186 End stage renal disease: Secondary | ICD-10-CM | POA: Diagnosis not present

## 2018-05-05 DIAGNOSIS — N2581 Secondary hyperparathyroidism of renal origin: Secondary | ICD-10-CM | POA: Diagnosis not present

## 2018-05-05 DIAGNOSIS — E1129 Type 2 diabetes mellitus with other diabetic kidney complication: Secondary | ICD-10-CM | POA: Diagnosis not present

## 2018-05-05 DIAGNOSIS — D631 Anemia in chronic kidney disease: Secondary | ICD-10-CM | POA: Diagnosis not present

## 2018-05-07 DIAGNOSIS — S92425D Nondisplaced fracture of distal phalanx of left great toe, subsequent encounter for fracture with routine healing: Secondary | ICD-10-CM | POA: Diagnosis not present

## 2018-05-07 DIAGNOSIS — L97521 Non-pressure chronic ulcer of other part of left foot limited to breakdown of skin: Secondary | ICD-10-CM | POA: Diagnosis not present

## 2018-05-07 DIAGNOSIS — Z992 Dependence on renal dialysis: Secondary | ICD-10-CM | POA: Diagnosis not present

## 2018-05-07 DIAGNOSIS — E1122 Type 2 diabetes mellitus with diabetic chronic kidney disease: Secondary | ICD-10-CM | POA: Diagnosis not present

## 2018-05-07 DIAGNOSIS — N186 End stage renal disease: Secondary | ICD-10-CM | POA: Diagnosis not present

## 2018-05-08 DIAGNOSIS — D631 Anemia in chronic kidney disease: Secondary | ICD-10-CM | POA: Diagnosis not present

## 2018-05-08 DIAGNOSIS — N186 End stage renal disease: Secondary | ICD-10-CM | POA: Diagnosis not present

## 2018-05-08 DIAGNOSIS — N2581 Secondary hyperparathyroidism of renal origin: Secondary | ICD-10-CM | POA: Diagnosis not present

## 2018-05-08 DIAGNOSIS — Z992 Dependence on renal dialysis: Secondary | ICD-10-CM | POA: Diagnosis not present

## 2018-05-08 DIAGNOSIS — D509 Iron deficiency anemia, unspecified: Secondary | ICD-10-CM | POA: Diagnosis not present

## 2018-05-08 DIAGNOSIS — E1129 Type 2 diabetes mellitus with other diabetic kidney complication: Secondary | ICD-10-CM | POA: Diagnosis not present

## 2018-05-10 DIAGNOSIS — E1129 Type 2 diabetes mellitus with other diabetic kidney complication: Secondary | ICD-10-CM | POA: Diagnosis not present

## 2018-05-10 DIAGNOSIS — Z992 Dependence on renal dialysis: Secondary | ICD-10-CM | POA: Diagnosis not present

## 2018-05-10 DIAGNOSIS — N186 End stage renal disease: Secondary | ICD-10-CM | POA: Diagnosis not present

## 2018-05-10 DIAGNOSIS — N2581 Secondary hyperparathyroidism of renal origin: Secondary | ICD-10-CM | POA: Diagnosis not present

## 2018-05-10 DIAGNOSIS — D631 Anemia in chronic kidney disease: Secondary | ICD-10-CM | POA: Diagnosis not present

## 2018-05-10 DIAGNOSIS — D509 Iron deficiency anemia, unspecified: Secondary | ICD-10-CM | POA: Diagnosis not present

## 2018-05-12 DIAGNOSIS — D509 Iron deficiency anemia, unspecified: Secondary | ICD-10-CM | POA: Diagnosis not present

## 2018-05-12 DIAGNOSIS — D631 Anemia in chronic kidney disease: Secondary | ICD-10-CM | POA: Diagnosis not present

## 2018-05-12 DIAGNOSIS — Z992 Dependence on renal dialysis: Secondary | ICD-10-CM | POA: Diagnosis not present

## 2018-05-12 DIAGNOSIS — E1129 Type 2 diabetes mellitus with other diabetic kidney complication: Secondary | ICD-10-CM | POA: Diagnosis not present

## 2018-05-12 DIAGNOSIS — N2581 Secondary hyperparathyroidism of renal origin: Secondary | ICD-10-CM | POA: Diagnosis not present

## 2018-05-12 DIAGNOSIS — N186 End stage renal disease: Secondary | ICD-10-CM | POA: Diagnosis not present

## 2018-05-15 DIAGNOSIS — E1129 Type 2 diabetes mellitus with other diabetic kidney complication: Secondary | ICD-10-CM | POA: Diagnosis not present

## 2018-05-15 DIAGNOSIS — D509 Iron deficiency anemia, unspecified: Secondary | ICD-10-CM | POA: Diagnosis not present

## 2018-05-15 DIAGNOSIS — N186 End stage renal disease: Secondary | ICD-10-CM | POA: Diagnosis not present

## 2018-05-15 DIAGNOSIS — Z992 Dependence on renal dialysis: Secondary | ICD-10-CM | POA: Diagnosis not present

## 2018-05-15 DIAGNOSIS — N2581 Secondary hyperparathyroidism of renal origin: Secondary | ICD-10-CM | POA: Diagnosis not present

## 2018-05-15 DIAGNOSIS — D631 Anemia in chronic kidney disease: Secondary | ICD-10-CM | POA: Diagnosis not present

## 2018-05-17 DIAGNOSIS — N2581 Secondary hyperparathyroidism of renal origin: Secondary | ICD-10-CM | POA: Diagnosis not present

## 2018-05-17 DIAGNOSIS — E1129 Type 2 diabetes mellitus with other diabetic kidney complication: Secondary | ICD-10-CM | POA: Diagnosis not present

## 2018-05-17 DIAGNOSIS — N186 End stage renal disease: Secondary | ICD-10-CM | POA: Diagnosis not present

## 2018-05-17 DIAGNOSIS — D631 Anemia in chronic kidney disease: Secondary | ICD-10-CM | POA: Diagnosis not present

## 2018-05-17 DIAGNOSIS — D509 Iron deficiency anemia, unspecified: Secondary | ICD-10-CM | POA: Diagnosis not present

## 2018-05-17 DIAGNOSIS — Z992 Dependence on renal dialysis: Secondary | ICD-10-CM | POA: Diagnosis not present

## 2018-05-19 DIAGNOSIS — E1129 Type 2 diabetes mellitus with other diabetic kidney complication: Secondary | ICD-10-CM | POA: Diagnosis not present

## 2018-05-19 DIAGNOSIS — D631 Anemia in chronic kidney disease: Secondary | ICD-10-CM | POA: Diagnosis not present

## 2018-05-19 DIAGNOSIS — Z992 Dependence on renal dialysis: Secondary | ICD-10-CM | POA: Diagnosis not present

## 2018-05-19 DIAGNOSIS — N2581 Secondary hyperparathyroidism of renal origin: Secondary | ICD-10-CM | POA: Diagnosis not present

## 2018-05-19 DIAGNOSIS — N186 End stage renal disease: Secondary | ICD-10-CM | POA: Diagnosis not present

## 2018-05-19 DIAGNOSIS — D509 Iron deficiency anemia, unspecified: Secondary | ICD-10-CM | POA: Diagnosis not present

## 2018-05-22 DIAGNOSIS — E1129 Type 2 diabetes mellitus with other diabetic kidney complication: Secondary | ICD-10-CM | POA: Diagnosis not present

## 2018-05-22 DIAGNOSIS — N2581 Secondary hyperparathyroidism of renal origin: Secondary | ICD-10-CM | POA: Diagnosis not present

## 2018-05-22 DIAGNOSIS — D509 Iron deficiency anemia, unspecified: Secondary | ICD-10-CM | POA: Diagnosis not present

## 2018-05-22 DIAGNOSIS — N186 End stage renal disease: Secondary | ICD-10-CM | POA: Diagnosis not present

## 2018-05-22 DIAGNOSIS — Z992 Dependence on renal dialysis: Secondary | ICD-10-CM | POA: Diagnosis not present

## 2018-05-22 DIAGNOSIS — D631 Anemia in chronic kidney disease: Secondary | ICD-10-CM | POA: Diagnosis not present

## 2018-05-24 DIAGNOSIS — N2581 Secondary hyperparathyroidism of renal origin: Secondary | ICD-10-CM | POA: Diagnosis not present

## 2018-05-24 DIAGNOSIS — N186 End stage renal disease: Secondary | ICD-10-CM | POA: Diagnosis not present

## 2018-05-24 DIAGNOSIS — D509 Iron deficiency anemia, unspecified: Secondary | ICD-10-CM | POA: Diagnosis not present

## 2018-05-24 DIAGNOSIS — D631 Anemia in chronic kidney disease: Secondary | ICD-10-CM | POA: Diagnosis not present

## 2018-05-24 DIAGNOSIS — Z992 Dependence on renal dialysis: Secondary | ICD-10-CM | POA: Diagnosis not present

## 2018-05-24 DIAGNOSIS — E1129 Type 2 diabetes mellitus with other diabetic kidney complication: Secondary | ICD-10-CM | POA: Diagnosis not present

## 2018-05-25 DIAGNOSIS — T82858A Stenosis of vascular prosthetic devices, implants and grafts, initial encounter: Secondary | ICD-10-CM | POA: Diagnosis not present

## 2018-05-25 DIAGNOSIS — N186 End stage renal disease: Secondary | ICD-10-CM | POA: Diagnosis not present

## 2018-05-25 DIAGNOSIS — Z992 Dependence on renal dialysis: Secondary | ICD-10-CM | POA: Diagnosis not present

## 2018-05-25 DIAGNOSIS — I871 Compression of vein: Secondary | ICD-10-CM | POA: Diagnosis not present

## 2018-05-26 DIAGNOSIS — N186 End stage renal disease: Secondary | ICD-10-CM | POA: Diagnosis not present

## 2018-05-26 DIAGNOSIS — N2581 Secondary hyperparathyroidism of renal origin: Secondary | ICD-10-CM | POA: Diagnosis not present

## 2018-05-26 DIAGNOSIS — E1129 Type 2 diabetes mellitus with other diabetic kidney complication: Secondary | ICD-10-CM | POA: Diagnosis not present

## 2018-05-26 DIAGNOSIS — Z992 Dependence on renal dialysis: Secondary | ICD-10-CM | POA: Diagnosis not present

## 2018-05-26 DIAGNOSIS — D509 Iron deficiency anemia, unspecified: Secondary | ICD-10-CM | POA: Diagnosis not present

## 2018-05-26 DIAGNOSIS — D631 Anemia in chronic kidney disease: Secondary | ICD-10-CM | POA: Diagnosis not present

## 2018-05-29 DIAGNOSIS — E1129 Type 2 diabetes mellitus with other diabetic kidney complication: Secondary | ICD-10-CM | POA: Diagnosis not present

## 2018-05-29 DIAGNOSIS — Z992 Dependence on renal dialysis: Secondary | ICD-10-CM | POA: Diagnosis not present

## 2018-05-29 DIAGNOSIS — N186 End stage renal disease: Secondary | ICD-10-CM | POA: Diagnosis not present

## 2018-05-29 DIAGNOSIS — D509 Iron deficiency anemia, unspecified: Secondary | ICD-10-CM | POA: Diagnosis not present

## 2018-05-29 DIAGNOSIS — D631 Anemia in chronic kidney disease: Secondary | ICD-10-CM | POA: Diagnosis not present

## 2018-05-29 DIAGNOSIS — N2581 Secondary hyperparathyroidism of renal origin: Secondary | ICD-10-CM | POA: Diagnosis not present

## 2018-05-31 DIAGNOSIS — N186 End stage renal disease: Secondary | ICD-10-CM | POA: Diagnosis not present

## 2018-05-31 DIAGNOSIS — Z992 Dependence on renal dialysis: Secondary | ICD-10-CM | POA: Diagnosis not present

## 2018-05-31 DIAGNOSIS — D509 Iron deficiency anemia, unspecified: Secondary | ICD-10-CM | POA: Diagnosis not present

## 2018-05-31 DIAGNOSIS — N2581 Secondary hyperparathyroidism of renal origin: Secondary | ICD-10-CM | POA: Diagnosis not present

## 2018-05-31 DIAGNOSIS — E1129 Type 2 diabetes mellitus with other diabetic kidney complication: Secondary | ICD-10-CM | POA: Diagnosis not present

## 2018-05-31 DIAGNOSIS — D631 Anemia in chronic kidney disease: Secondary | ICD-10-CM | POA: Diagnosis not present

## 2018-06-02 DIAGNOSIS — N2581 Secondary hyperparathyroidism of renal origin: Secondary | ICD-10-CM | POA: Diagnosis not present

## 2018-06-02 DIAGNOSIS — E1129 Type 2 diabetes mellitus with other diabetic kidney complication: Secondary | ICD-10-CM | POA: Diagnosis not present

## 2018-06-02 DIAGNOSIS — D631 Anemia in chronic kidney disease: Secondary | ICD-10-CM | POA: Diagnosis not present

## 2018-06-02 DIAGNOSIS — Z992 Dependence on renal dialysis: Secondary | ICD-10-CM | POA: Diagnosis not present

## 2018-06-02 DIAGNOSIS — N186 End stage renal disease: Secondary | ICD-10-CM | POA: Diagnosis not present

## 2018-06-02 DIAGNOSIS — D509 Iron deficiency anemia, unspecified: Secondary | ICD-10-CM | POA: Diagnosis not present

## 2018-06-05 DIAGNOSIS — N2581 Secondary hyperparathyroidism of renal origin: Secondary | ICD-10-CM | POA: Diagnosis not present

## 2018-06-05 DIAGNOSIS — N186 End stage renal disease: Secondary | ICD-10-CM | POA: Diagnosis not present

## 2018-06-05 DIAGNOSIS — E1129 Type 2 diabetes mellitus with other diabetic kidney complication: Secondary | ICD-10-CM | POA: Diagnosis not present

## 2018-06-05 DIAGNOSIS — Z992 Dependence on renal dialysis: Secondary | ICD-10-CM | POA: Diagnosis not present

## 2018-06-05 DIAGNOSIS — D631 Anemia in chronic kidney disease: Secondary | ICD-10-CM | POA: Diagnosis not present

## 2018-06-05 DIAGNOSIS — D509 Iron deficiency anemia, unspecified: Secondary | ICD-10-CM | POA: Diagnosis not present

## 2018-06-07 DIAGNOSIS — E1122 Type 2 diabetes mellitus with diabetic chronic kidney disease: Secondary | ICD-10-CM | POA: Diagnosis not present

## 2018-06-07 DIAGNOSIS — Z992 Dependence on renal dialysis: Secondary | ICD-10-CM | POA: Diagnosis not present

## 2018-06-07 DIAGNOSIS — N186 End stage renal disease: Secondary | ICD-10-CM | POA: Diagnosis not present

## 2018-06-08 DIAGNOSIS — E1122 Type 2 diabetes mellitus with diabetic chronic kidney disease: Secondary | ICD-10-CM | POA: Diagnosis not present

## 2018-06-08 DIAGNOSIS — Z992 Dependence on renal dialysis: Secondary | ICD-10-CM | POA: Diagnosis not present

## 2018-06-08 DIAGNOSIS — N186 End stage renal disease: Secondary | ICD-10-CM | POA: Diagnosis not present

## 2018-06-09 DIAGNOSIS — D631 Anemia in chronic kidney disease: Secondary | ICD-10-CM | POA: Diagnosis not present

## 2018-06-09 DIAGNOSIS — E1129 Type 2 diabetes mellitus with other diabetic kidney complication: Secondary | ICD-10-CM | POA: Diagnosis not present

## 2018-06-09 DIAGNOSIS — D509 Iron deficiency anemia, unspecified: Secondary | ICD-10-CM | POA: Diagnosis not present

## 2018-06-09 DIAGNOSIS — N2581 Secondary hyperparathyroidism of renal origin: Secondary | ICD-10-CM | POA: Diagnosis not present

## 2018-06-09 DIAGNOSIS — N186 End stage renal disease: Secondary | ICD-10-CM | POA: Diagnosis not present

## 2018-06-09 DIAGNOSIS — Z992 Dependence on renal dialysis: Secondary | ICD-10-CM | POA: Diagnosis not present

## 2018-06-12 DIAGNOSIS — E1129 Type 2 diabetes mellitus with other diabetic kidney complication: Secondary | ICD-10-CM | POA: Diagnosis not present

## 2018-06-12 DIAGNOSIS — Z992 Dependence on renal dialysis: Secondary | ICD-10-CM | POA: Diagnosis not present

## 2018-06-12 DIAGNOSIS — D631 Anemia in chronic kidney disease: Secondary | ICD-10-CM | POA: Diagnosis not present

## 2018-06-12 DIAGNOSIS — N186 End stage renal disease: Secondary | ICD-10-CM | POA: Diagnosis not present

## 2018-06-12 DIAGNOSIS — N2581 Secondary hyperparathyroidism of renal origin: Secondary | ICD-10-CM | POA: Diagnosis not present

## 2018-06-12 DIAGNOSIS — D509 Iron deficiency anemia, unspecified: Secondary | ICD-10-CM | POA: Diagnosis not present

## 2018-06-14 DIAGNOSIS — N186 End stage renal disease: Secondary | ICD-10-CM | POA: Diagnosis not present

## 2018-06-14 DIAGNOSIS — N2581 Secondary hyperparathyroidism of renal origin: Secondary | ICD-10-CM | POA: Diagnosis not present

## 2018-06-14 DIAGNOSIS — Z992 Dependence on renal dialysis: Secondary | ICD-10-CM | POA: Diagnosis not present

## 2018-06-14 DIAGNOSIS — E1129 Type 2 diabetes mellitus with other diabetic kidney complication: Secondary | ICD-10-CM | POA: Diagnosis not present

## 2018-06-14 DIAGNOSIS — D631 Anemia in chronic kidney disease: Secondary | ICD-10-CM | POA: Diagnosis not present

## 2018-06-14 DIAGNOSIS — D509 Iron deficiency anemia, unspecified: Secondary | ICD-10-CM | POA: Diagnosis not present

## 2018-06-16 DIAGNOSIS — N186 End stage renal disease: Secondary | ICD-10-CM | POA: Diagnosis not present

## 2018-06-16 DIAGNOSIS — Z992 Dependence on renal dialysis: Secondary | ICD-10-CM | POA: Diagnosis not present

## 2018-06-16 DIAGNOSIS — D509 Iron deficiency anemia, unspecified: Secondary | ICD-10-CM | POA: Diagnosis not present

## 2018-06-16 DIAGNOSIS — E1129 Type 2 diabetes mellitus with other diabetic kidney complication: Secondary | ICD-10-CM | POA: Diagnosis not present

## 2018-06-16 DIAGNOSIS — N2581 Secondary hyperparathyroidism of renal origin: Secondary | ICD-10-CM | POA: Diagnosis not present

## 2018-06-16 DIAGNOSIS — D631 Anemia in chronic kidney disease: Secondary | ICD-10-CM | POA: Diagnosis not present

## 2018-06-19 DIAGNOSIS — N2581 Secondary hyperparathyroidism of renal origin: Secondary | ICD-10-CM | POA: Diagnosis not present

## 2018-06-19 DIAGNOSIS — D631 Anemia in chronic kidney disease: Secondary | ICD-10-CM | POA: Diagnosis not present

## 2018-06-19 DIAGNOSIS — E1129 Type 2 diabetes mellitus with other diabetic kidney complication: Secondary | ICD-10-CM | POA: Diagnosis not present

## 2018-06-19 DIAGNOSIS — D509 Iron deficiency anemia, unspecified: Secondary | ICD-10-CM | POA: Diagnosis not present

## 2018-06-19 DIAGNOSIS — N186 End stage renal disease: Secondary | ICD-10-CM | POA: Diagnosis not present

## 2018-06-19 DIAGNOSIS — Z992 Dependence on renal dialysis: Secondary | ICD-10-CM | POA: Diagnosis not present

## 2018-06-22 DIAGNOSIS — N186 End stage renal disease: Secondary | ICD-10-CM | POA: Diagnosis not present

## 2018-06-22 DIAGNOSIS — Z992 Dependence on renal dialysis: Secondary | ICD-10-CM | POA: Diagnosis not present

## 2018-06-22 DIAGNOSIS — I871 Compression of vein: Secondary | ICD-10-CM | POA: Diagnosis not present

## 2018-06-23 DIAGNOSIS — E1129 Type 2 diabetes mellitus with other diabetic kidney complication: Secondary | ICD-10-CM | POA: Diagnosis not present

## 2018-06-23 DIAGNOSIS — N2581 Secondary hyperparathyroidism of renal origin: Secondary | ICD-10-CM | POA: Diagnosis not present

## 2018-06-23 DIAGNOSIS — D631 Anemia in chronic kidney disease: Secondary | ICD-10-CM | POA: Diagnosis not present

## 2018-06-23 DIAGNOSIS — Z992 Dependence on renal dialysis: Secondary | ICD-10-CM | POA: Diagnosis not present

## 2018-06-23 DIAGNOSIS — D509 Iron deficiency anemia, unspecified: Secondary | ICD-10-CM | POA: Diagnosis not present

## 2018-06-23 DIAGNOSIS — N186 End stage renal disease: Secondary | ICD-10-CM | POA: Diagnosis not present

## 2018-06-26 DIAGNOSIS — E1129 Type 2 diabetes mellitus with other diabetic kidney complication: Secondary | ICD-10-CM | POA: Diagnosis not present

## 2018-06-26 DIAGNOSIS — D631 Anemia in chronic kidney disease: Secondary | ICD-10-CM | POA: Diagnosis not present

## 2018-06-26 DIAGNOSIS — N2581 Secondary hyperparathyroidism of renal origin: Secondary | ICD-10-CM | POA: Diagnosis not present

## 2018-06-26 DIAGNOSIS — Z992 Dependence on renal dialysis: Secondary | ICD-10-CM | POA: Diagnosis not present

## 2018-06-26 DIAGNOSIS — N186 End stage renal disease: Secondary | ICD-10-CM | POA: Diagnosis not present

## 2018-06-26 DIAGNOSIS — D509 Iron deficiency anemia, unspecified: Secondary | ICD-10-CM | POA: Diagnosis not present

## 2018-06-28 DIAGNOSIS — N2581 Secondary hyperparathyroidism of renal origin: Secondary | ICD-10-CM | POA: Diagnosis not present

## 2018-06-28 DIAGNOSIS — E1129 Type 2 diabetes mellitus with other diabetic kidney complication: Secondary | ICD-10-CM | POA: Diagnosis not present

## 2018-06-28 DIAGNOSIS — D509 Iron deficiency anemia, unspecified: Secondary | ICD-10-CM | POA: Diagnosis not present

## 2018-06-28 DIAGNOSIS — Z992 Dependence on renal dialysis: Secondary | ICD-10-CM | POA: Diagnosis not present

## 2018-06-28 DIAGNOSIS — N186 End stage renal disease: Secondary | ICD-10-CM | POA: Diagnosis not present

## 2018-06-28 DIAGNOSIS — D631 Anemia in chronic kidney disease: Secondary | ICD-10-CM | POA: Diagnosis not present

## 2018-06-29 ENCOUNTER — Encounter (INDEPENDENT_AMBULATORY_CARE_PROVIDER_SITE_OTHER): Payer: Medicare Other | Admitting: Ophthalmology

## 2018-06-29 DIAGNOSIS — E11311 Type 2 diabetes mellitus with unspecified diabetic retinopathy with macular edema: Secondary | ICD-10-CM

## 2018-06-29 DIAGNOSIS — I10 Essential (primary) hypertension: Secondary | ICD-10-CM | POA: Diagnosis not present

## 2018-06-29 DIAGNOSIS — E113311 Type 2 diabetes mellitus with moderate nonproliferative diabetic retinopathy with macular edema, right eye: Secondary | ICD-10-CM | POA: Diagnosis not present

## 2018-06-29 DIAGNOSIS — H43813 Vitreous degeneration, bilateral: Secondary | ICD-10-CM | POA: Diagnosis not present

## 2018-06-29 DIAGNOSIS — E113392 Type 2 diabetes mellitus with moderate nonproliferative diabetic retinopathy without macular edema, left eye: Secondary | ICD-10-CM | POA: Diagnosis not present

## 2018-06-29 DIAGNOSIS — H35033 Hypertensive retinopathy, bilateral: Secondary | ICD-10-CM | POA: Diagnosis not present

## 2018-06-30 DIAGNOSIS — E1129 Type 2 diabetes mellitus with other diabetic kidney complication: Secondary | ICD-10-CM | POA: Diagnosis not present

## 2018-06-30 DIAGNOSIS — D509 Iron deficiency anemia, unspecified: Secondary | ICD-10-CM | POA: Diagnosis not present

## 2018-06-30 DIAGNOSIS — N2581 Secondary hyperparathyroidism of renal origin: Secondary | ICD-10-CM | POA: Diagnosis not present

## 2018-06-30 DIAGNOSIS — N186 End stage renal disease: Secondary | ICD-10-CM | POA: Diagnosis not present

## 2018-06-30 DIAGNOSIS — Z992 Dependence on renal dialysis: Secondary | ICD-10-CM | POA: Diagnosis not present

## 2018-06-30 DIAGNOSIS — D631 Anemia in chronic kidney disease: Secondary | ICD-10-CM | POA: Diagnosis not present

## 2018-07-02 DIAGNOSIS — Z992 Dependence on renal dialysis: Secondary | ICD-10-CM | POA: Diagnosis not present

## 2018-07-02 DIAGNOSIS — N186 End stage renal disease: Secondary | ICD-10-CM | POA: Diagnosis not present

## 2018-07-02 DIAGNOSIS — D509 Iron deficiency anemia, unspecified: Secondary | ICD-10-CM | POA: Diagnosis not present

## 2018-07-02 DIAGNOSIS — E1129 Type 2 diabetes mellitus with other diabetic kidney complication: Secondary | ICD-10-CM | POA: Diagnosis not present

## 2018-07-02 DIAGNOSIS — N2581 Secondary hyperparathyroidism of renal origin: Secondary | ICD-10-CM | POA: Diagnosis not present

## 2018-07-02 DIAGNOSIS — D631 Anemia in chronic kidney disease: Secondary | ICD-10-CM | POA: Diagnosis not present

## 2018-07-04 DIAGNOSIS — D631 Anemia in chronic kidney disease: Secondary | ICD-10-CM | POA: Diagnosis not present

## 2018-07-04 DIAGNOSIS — N2581 Secondary hyperparathyroidism of renal origin: Secondary | ICD-10-CM | POA: Diagnosis not present

## 2018-07-04 DIAGNOSIS — Z992 Dependence on renal dialysis: Secondary | ICD-10-CM | POA: Diagnosis not present

## 2018-07-04 DIAGNOSIS — E1129 Type 2 diabetes mellitus with other diabetic kidney complication: Secondary | ICD-10-CM | POA: Diagnosis not present

## 2018-07-04 DIAGNOSIS — D509 Iron deficiency anemia, unspecified: Secondary | ICD-10-CM | POA: Diagnosis not present

## 2018-07-04 DIAGNOSIS — N186 End stage renal disease: Secondary | ICD-10-CM | POA: Diagnosis not present

## 2018-07-07 DIAGNOSIS — D631 Anemia in chronic kidney disease: Secondary | ICD-10-CM | POA: Diagnosis not present

## 2018-07-07 DIAGNOSIS — N186 End stage renal disease: Secondary | ICD-10-CM | POA: Diagnosis not present

## 2018-07-07 DIAGNOSIS — D509 Iron deficiency anemia, unspecified: Secondary | ICD-10-CM | POA: Diagnosis not present

## 2018-07-07 DIAGNOSIS — Z992 Dependence on renal dialysis: Secondary | ICD-10-CM | POA: Diagnosis not present

## 2018-07-07 DIAGNOSIS — N2581 Secondary hyperparathyroidism of renal origin: Secondary | ICD-10-CM | POA: Diagnosis not present

## 2018-07-07 DIAGNOSIS — E1129 Type 2 diabetes mellitus with other diabetic kidney complication: Secondary | ICD-10-CM | POA: Diagnosis not present

## 2018-07-08 DIAGNOSIS — Z992 Dependence on renal dialysis: Secondary | ICD-10-CM | POA: Diagnosis not present

## 2018-07-08 DIAGNOSIS — E1122 Type 2 diabetes mellitus with diabetic chronic kidney disease: Secondary | ICD-10-CM | POA: Diagnosis not present

## 2018-07-08 DIAGNOSIS — N186 End stage renal disease: Secondary | ICD-10-CM | POA: Diagnosis not present

## 2018-07-10 DIAGNOSIS — D509 Iron deficiency anemia, unspecified: Secondary | ICD-10-CM | POA: Diagnosis not present

## 2018-07-10 DIAGNOSIS — N186 End stage renal disease: Secondary | ICD-10-CM | POA: Diagnosis not present

## 2018-07-10 DIAGNOSIS — N2581 Secondary hyperparathyroidism of renal origin: Secondary | ICD-10-CM | POA: Diagnosis not present

## 2018-07-10 DIAGNOSIS — Z992 Dependence on renal dialysis: Secondary | ICD-10-CM | POA: Diagnosis not present

## 2018-07-10 DIAGNOSIS — D631 Anemia in chronic kidney disease: Secondary | ICD-10-CM | POA: Diagnosis not present

## 2018-07-10 DIAGNOSIS — E1129 Type 2 diabetes mellitus with other diabetic kidney complication: Secondary | ICD-10-CM | POA: Diagnosis not present

## 2018-07-12 DIAGNOSIS — N2581 Secondary hyperparathyroidism of renal origin: Secondary | ICD-10-CM | POA: Diagnosis not present

## 2018-07-12 DIAGNOSIS — D509 Iron deficiency anemia, unspecified: Secondary | ICD-10-CM | POA: Diagnosis not present

## 2018-07-12 DIAGNOSIS — Z992 Dependence on renal dialysis: Secondary | ICD-10-CM | POA: Diagnosis not present

## 2018-07-12 DIAGNOSIS — N186 End stage renal disease: Secondary | ICD-10-CM | POA: Diagnosis not present

## 2018-07-12 DIAGNOSIS — D631 Anemia in chronic kidney disease: Secondary | ICD-10-CM | POA: Diagnosis not present

## 2018-07-12 DIAGNOSIS — E1129 Type 2 diabetes mellitus with other diabetic kidney complication: Secondary | ICD-10-CM | POA: Diagnosis not present

## 2018-07-13 DIAGNOSIS — Z961 Presence of intraocular lens: Secondary | ICD-10-CM | POA: Diagnosis not present

## 2018-07-13 DIAGNOSIS — H18413 Arcus senilis, bilateral: Secondary | ICD-10-CM | POA: Diagnosis not present

## 2018-07-13 DIAGNOSIS — H11153 Pinguecula, bilateral: Secondary | ICD-10-CM | POA: Diagnosis not present

## 2018-07-13 DIAGNOSIS — H47233 Glaucomatous optic atrophy, bilateral: Secondary | ICD-10-CM | POA: Diagnosis not present

## 2018-07-13 DIAGNOSIS — I10 Essential (primary) hypertension: Secondary | ICD-10-CM | POA: Diagnosis not present

## 2018-07-13 DIAGNOSIS — H35033 Hypertensive retinopathy, bilateral: Secondary | ICD-10-CM | POA: Diagnosis not present

## 2018-07-13 DIAGNOSIS — H401132 Primary open-angle glaucoma, bilateral, moderate stage: Secondary | ICD-10-CM | POA: Diagnosis not present

## 2018-07-14 DIAGNOSIS — N2581 Secondary hyperparathyroidism of renal origin: Secondary | ICD-10-CM | POA: Diagnosis not present

## 2018-07-14 DIAGNOSIS — Z992 Dependence on renal dialysis: Secondary | ICD-10-CM | POA: Diagnosis not present

## 2018-07-14 DIAGNOSIS — D631 Anemia in chronic kidney disease: Secondary | ICD-10-CM | POA: Diagnosis not present

## 2018-07-14 DIAGNOSIS — D509 Iron deficiency anemia, unspecified: Secondary | ICD-10-CM | POA: Diagnosis not present

## 2018-07-14 DIAGNOSIS — E1129 Type 2 diabetes mellitus with other diabetic kidney complication: Secondary | ICD-10-CM | POA: Diagnosis not present

## 2018-07-14 DIAGNOSIS — N186 End stage renal disease: Secondary | ICD-10-CM | POA: Diagnosis not present

## 2018-07-17 DIAGNOSIS — E1129 Type 2 diabetes mellitus with other diabetic kidney complication: Secondary | ICD-10-CM | POA: Diagnosis not present

## 2018-07-17 DIAGNOSIS — D509 Iron deficiency anemia, unspecified: Secondary | ICD-10-CM | POA: Diagnosis not present

## 2018-07-17 DIAGNOSIS — N2581 Secondary hyperparathyroidism of renal origin: Secondary | ICD-10-CM | POA: Diagnosis not present

## 2018-07-17 DIAGNOSIS — Z992 Dependence on renal dialysis: Secondary | ICD-10-CM | POA: Diagnosis not present

## 2018-07-17 DIAGNOSIS — N186 End stage renal disease: Secondary | ICD-10-CM | POA: Diagnosis not present

## 2018-07-17 DIAGNOSIS — D631 Anemia in chronic kidney disease: Secondary | ICD-10-CM | POA: Diagnosis not present

## 2018-07-19 DIAGNOSIS — D509 Iron deficiency anemia, unspecified: Secondary | ICD-10-CM | POA: Diagnosis not present

## 2018-07-19 DIAGNOSIS — E1129 Type 2 diabetes mellitus with other diabetic kidney complication: Secondary | ICD-10-CM | POA: Diagnosis not present

## 2018-07-19 DIAGNOSIS — D631 Anemia in chronic kidney disease: Secondary | ICD-10-CM | POA: Diagnosis not present

## 2018-07-19 DIAGNOSIS — Z992 Dependence on renal dialysis: Secondary | ICD-10-CM | POA: Diagnosis not present

## 2018-07-19 DIAGNOSIS — N2581 Secondary hyperparathyroidism of renal origin: Secondary | ICD-10-CM | POA: Diagnosis not present

## 2018-07-19 DIAGNOSIS — N186 End stage renal disease: Secondary | ICD-10-CM | POA: Diagnosis not present

## 2018-07-21 DIAGNOSIS — D509 Iron deficiency anemia, unspecified: Secondary | ICD-10-CM | POA: Diagnosis not present

## 2018-07-21 DIAGNOSIS — Z992 Dependence on renal dialysis: Secondary | ICD-10-CM | POA: Diagnosis not present

## 2018-07-21 DIAGNOSIS — N2581 Secondary hyperparathyroidism of renal origin: Secondary | ICD-10-CM | POA: Diagnosis not present

## 2018-07-21 DIAGNOSIS — D631 Anemia in chronic kidney disease: Secondary | ICD-10-CM | POA: Diagnosis not present

## 2018-07-21 DIAGNOSIS — N186 End stage renal disease: Secondary | ICD-10-CM | POA: Diagnosis not present

## 2018-07-21 DIAGNOSIS — E1129 Type 2 diabetes mellitus with other diabetic kidney complication: Secondary | ICD-10-CM | POA: Diagnosis not present

## 2018-07-24 DIAGNOSIS — Z992 Dependence on renal dialysis: Secondary | ICD-10-CM | POA: Diagnosis not present

## 2018-07-24 DIAGNOSIS — E1129 Type 2 diabetes mellitus with other diabetic kidney complication: Secondary | ICD-10-CM | POA: Diagnosis not present

## 2018-07-24 DIAGNOSIS — D631 Anemia in chronic kidney disease: Secondary | ICD-10-CM | POA: Diagnosis not present

## 2018-07-24 DIAGNOSIS — D509 Iron deficiency anemia, unspecified: Secondary | ICD-10-CM | POA: Diagnosis not present

## 2018-07-24 DIAGNOSIS — N2581 Secondary hyperparathyroidism of renal origin: Secondary | ICD-10-CM | POA: Diagnosis not present

## 2018-07-24 DIAGNOSIS — N186 End stage renal disease: Secondary | ICD-10-CM | POA: Diagnosis not present

## 2018-07-26 DIAGNOSIS — D631 Anemia in chronic kidney disease: Secondary | ICD-10-CM | POA: Diagnosis not present

## 2018-07-26 DIAGNOSIS — Z992 Dependence on renal dialysis: Secondary | ICD-10-CM | POA: Diagnosis not present

## 2018-07-26 DIAGNOSIS — N2581 Secondary hyperparathyroidism of renal origin: Secondary | ICD-10-CM | POA: Diagnosis not present

## 2018-07-26 DIAGNOSIS — E1129 Type 2 diabetes mellitus with other diabetic kidney complication: Secondary | ICD-10-CM | POA: Diagnosis not present

## 2018-07-26 DIAGNOSIS — N186 End stage renal disease: Secondary | ICD-10-CM | POA: Diagnosis not present

## 2018-07-26 DIAGNOSIS — D509 Iron deficiency anemia, unspecified: Secondary | ICD-10-CM | POA: Diagnosis not present

## 2018-07-30 DIAGNOSIS — N2581 Secondary hyperparathyroidism of renal origin: Secondary | ICD-10-CM | POA: Diagnosis not present

## 2018-07-30 DIAGNOSIS — N186 End stage renal disease: Secondary | ICD-10-CM | POA: Diagnosis not present

## 2018-07-30 DIAGNOSIS — Z992 Dependence on renal dialysis: Secondary | ICD-10-CM | POA: Diagnosis not present

## 2018-07-30 DIAGNOSIS — D631 Anemia in chronic kidney disease: Secondary | ICD-10-CM | POA: Diagnosis not present

## 2018-07-30 DIAGNOSIS — D509 Iron deficiency anemia, unspecified: Secondary | ICD-10-CM | POA: Diagnosis not present

## 2018-07-30 DIAGNOSIS — E1129 Type 2 diabetes mellitus with other diabetic kidney complication: Secondary | ICD-10-CM | POA: Diagnosis not present

## 2018-08-04 DIAGNOSIS — Z992 Dependence on renal dialysis: Secondary | ICD-10-CM | POA: Diagnosis not present

## 2018-08-04 DIAGNOSIS — E1129 Type 2 diabetes mellitus with other diabetic kidney complication: Secondary | ICD-10-CM | POA: Diagnosis not present

## 2018-08-04 DIAGNOSIS — N186 End stage renal disease: Secondary | ICD-10-CM | POA: Diagnosis not present

## 2018-08-04 DIAGNOSIS — D631 Anemia in chronic kidney disease: Secondary | ICD-10-CM | POA: Diagnosis not present

## 2018-08-04 DIAGNOSIS — D509 Iron deficiency anemia, unspecified: Secondary | ICD-10-CM | POA: Diagnosis not present

## 2018-08-04 DIAGNOSIS — N2581 Secondary hyperparathyroidism of renal origin: Secondary | ICD-10-CM | POA: Diagnosis not present

## 2018-08-06 DIAGNOSIS — N186 End stage renal disease: Secondary | ICD-10-CM | POA: Diagnosis not present

## 2018-08-06 DIAGNOSIS — N2581 Secondary hyperparathyroidism of renal origin: Secondary | ICD-10-CM | POA: Diagnosis not present

## 2018-08-06 DIAGNOSIS — D631 Anemia in chronic kidney disease: Secondary | ICD-10-CM | POA: Diagnosis not present

## 2018-08-06 DIAGNOSIS — Z992 Dependence on renal dialysis: Secondary | ICD-10-CM | POA: Diagnosis not present

## 2018-08-06 DIAGNOSIS — E1129 Type 2 diabetes mellitus with other diabetic kidney complication: Secondary | ICD-10-CM | POA: Diagnosis not present

## 2018-08-06 DIAGNOSIS — D509 Iron deficiency anemia, unspecified: Secondary | ICD-10-CM | POA: Diagnosis not present

## 2018-08-08 DIAGNOSIS — N186 End stage renal disease: Secondary | ICD-10-CM | POA: Diagnosis not present

## 2018-08-08 DIAGNOSIS — Z992 Dependence on renal dialysis: Secondary | ICD-10-CM | POA: Diagnosis not present

## 2018-08-08 DIAGNOSIS — E1122 Type 2 diabetes mellitus with diabetic chronic kidney disease: Secondary | ICD-10-CM | POA: Diagnosis not present

## 2018-08-09 DIAGNOSIS — D631 Anemia in chronic kidney disease: Secondary | ICD-10-CM | POA: Diagnosis not present

## 2018-08-09 DIAGNOSIS — N186 End stage renal disease: Secondary | ICD-10-CM | POA: Diagnosis not present

## 2018-08-09 DIAGNOSIS — E1129 Type 2 diabetes mellitus with other diabetic kidney complication: Secondary | ICD-10-CM | POA: Diagnosis not present

## 2018-08-09 DIAGNOSIS — Z992 Dependence on renal dialysis: Secondary | ICD-10-CM | POA: Diagnosis not present

## 2018-08-09 DIAGNOSIS — N2581 Secondary hyperparathyroidism of renal origin: Secondary | ICD-10-CM | POA: Diagnosis not present

## 2018-08-11 DIAGNOSIS — N186 End stage renal disease: Secondary | ICD-10-CM | POA: Diagnosis not present

## 2018-08-11 DIAGNOSIS — Z992 Dependence on renal dialysis: Secondary | ICD-10-CM | POA: Diagnosis not present

## 2018-08-11 DIAGNOSIS — E1129 Type 2 diabetes mellitus with other diabetic kidney complication: Secondary | ICD-10-CM | POA: Diagnosis not present

## 2018-08-11 DIAGNOSIS — D631 Anemia in chronic kidney disease: Secondary | ICD-10-CM | POA: Diagnosis not present

## 2018-08-11 DIAGNOSIS — N2581 Secondary hyperparathyroidism of renal origin: Secondary | ICD-10-CM | POA: Diagnosis not present

## 2018-08-14 DIAGNOSIS — N186 End stage renal disease: Secondary | ICD-10-CM | POA: Diagnosis not present

## 2018-08-14 DIAGNOSIS — Z992 Dependence on renal dialysis: Secondary | ICD-10-CM | POA: Diagnosis not present

## 2018-08-14 DIAGNOSIS — N2581 Secondary hyperparathyroidism of renal origin: Secondary | ICD-10-CM | POA: Diagnosis not present

## 2018-08-14 DIAGNOSIS — D631 Anemia in chronic kidney disease: Secondary | ICD-10-CM | POA: Diagnosis not present

## 2018-08-14 DIAGNOSIS — E1129 Type 2 diabetes mellitus with other diabetic kidney complication: Secondary | ICD-10-CM | POA: Diagnosis not present

## 2018-08-16 DIAGNOSIS — E1129 Type 2 diabetes mellitus with other diabetic kidney complication: Secondary | ICD-10-CM | POA: Diagnosis not present

## 2018-08-16 DIAGNOSIS — D631 Anemia in chronic kidney disease: Secondary | ICD-10-CM | POA: Diagnosis not present

## 2018-08-16 DIAGNOSIS — Z992 Dependence on renal dialysis: Secondary | ICD-10-CM | POA: Diagnosis not present

## 2018-08-16 DIAGNOSIS — N186 End stage renal disease: Secondary | ICD-10-CM | POA: Diagnosis not present

## 2018-08-16 DIAGNOSIS — N2581 Secondary hyperparathyroidism of renal origin: Secondary | ICD-10-CM | POA: Diagnosis not present

## 2018-08-18 DIAGNOSIS — N2581 Secondary hyperparathyroidism of renal origin: Secondary | ICD-10-CM | POA: Diagnosis not present

## 2018-08-18 DIAGNOSIS — N186 End stage renal disease: Secondary | ICD-10-CM | POA: Diagnosis not present

## 2018-08-18 DIAGNOSIS — D631 Anemia in chronic kidney disease: Secondary | ICD-10-CM | POA: Diagnosis not present

## 2018-08-18 DIAGNOSIS — Z992 Dependence on renal dialysis: Secondary | ICD-10-CM | POA: Diagnosis not present

## 2018-08-18 DIAGNOSIS — E1129 Type 2 diabetes mellitus with other diabetic kidney complication: Secondary | ICD-10-CM | POA: Diagnosis not present

## 2018-08-23 DIAGNOSIS — Z992 Dependence on renal dialysis: Secondary | ICD-10-CM | POA: Diagnosis not present

## 2018-08-23 DIAGNOSIS — N186 End stage renal disease: Secondary | ICD-10-CM | POA: Diagnosis not present

## 2018-08-23 DIAGNOSIS — D631 Anemia in chronic kidney disease: Secondary | ICD-10-CM | POA: Diagnosis not present

## 2018-08-23 DIAGNOSIS — E1129 Type 2 diabetes mellitus with other diabetic kidney complication: Secondary | ICD-10-CM | POA: Diagnosis not present

## 2018-08-23 DIAGNOSIS — N2581 Secondary hyperparathyroidism of renal origin: Secondary | ICD-10-CM | POA: Diagnosis not present

## 2018-08-25 DIAGNOSIS — D631 Anemia in chronic kidney disease: Secondary | ICD-10-CM | POA: Diagnosis not present

## 2018-08-25 DIAGNOSIS — E1129 Type 2 diabetes mellitus with other diabetic kidney complication: Secondary | ICD-10-CM | POA: Diagnosis not present

## 2018-08-25 DIAGNOSIS — N2581 Secondary hyperparathyroidism of renal origin: Secondary | ICD-10-CM | POA: Diagnosis not present

## 2018-08-25 DIAGNOSIS — Z992 Dependence on renal dialysis: Secondary | ICD-10-CM | POA: Diagnosis not present

## 2018-08-25 DIAGNOSIS — N186 End stage renal disease: Secondary | ICD-10-CM | POA: Diagnosis not present

## 2018-08-28 DIAGNOSIS — E1129 Type 2 diabetes mellitus with other diabetic kidney complication: Secondary | ICD-10-CM | POA: Diagnosis not present

## 2018-08-28 DIAGNOSIS — D631 Anemia in chronic kidney disease: Secondary | ICD-10-CM | POA: Diagnosis not present

## 2018-08-28 DIAGNOSIS — Z992 Dependence on renal dialysis: Secondary | ICD-10-CM | POA: Diagnosis not present

## 2018-08-28 DIAGNOSIS — N186 End stage renal disease: Secondary | ICD-10-CM | POA: Diagnosis not present

## 2018-08-28 DIAGNOSIS — N2581 Secondary hyperparathyroidism of renal origin: Secondary | ICD-10-CM | POA: Diagnosis not present

## 2018-08-30 DIAGNOSIS — E1129 Type 2 diabetes mellitus with other diabetic kidney complication: Secondary | ICD-10-CM | POA: Diagnosis not present

## 2018-08-30 DIAGNOSIS — Z992 Dependence on renal dialysis: Secondary | ICD-10-CM | POA: Diagnosis not present

## 2018-08-30 DIAGNOSIS — D631 Anemia in chronic kidney disease: Secondary | ICD-10-CM | POA: Diagnosis not present

## 2018-08-30 DIAGNOSIS — N186 End stage renal disease: Secondary | ICD-10-CM | POA: Diagnosis not present

## 2018-08-30 DIAGNOSIS — N2581 Secondary hyperparathyroidism of renal origin: Secondary | ICD-10-CM | POA: Diagnosis not present

## 2018-09-01 ENCOUNTER — Inpatient Hospital Stay (HOSPITAL_COMMUNITY)
Admission: EM | Admit: 2018-09-01 | Discharge: 2018-09-04 | DRG: 377 | Disposition: A | Discharge status: Home / Self Care | Payer: Medicare Other | Attending: Internal Medicine | Admitting: Internal Medicine

## 2018-09-01 ENCOUNTER — Other Ambulatory Visit: Payer: Self-pay

## 2018-09-01 ENCOUNTER — Encounter (HOSPITAL_COMMUNITY): Payer: Self-pay | Admitting: Emergency Medicine

## 2018-09-01 DIAGNOSIS — D125 Benign neoplasm of sigmoid colon: Secondary | ICD-10-CM

## 2018-09-01 DIAGNOSIS — N2581 Secondary hyperparathyroidism of renal origin: Secondary | ICD-10-CM | POA: Diagnosis not present

## 2018-09-01 DIAGNOSIS — M170 Bilateral primary osteoarthritis of knee: Secondary | ICD-10-CM | POA: Diagnosis present

## 2018-09-01 DIAGNOSIS — K5731 Diverticulosis of large intestine without perforation or abscess with bleeding: Principal | ICD-10-CM | POA: Diagnosis present

## 2018-09-01 DIAGNOSIS — I132 Hypertensive heart and chronic kidney disease with heart failure and with stage 5 chronic kidney disease, or end stage renal disease: Secondary | ICD-10-CM | POA: Diagnosis not present

## 2018-09-01 DIAGNOSIS — D120 Benign neoplasm of cecum: Secondary | ICD-10-CM

## 2018-09-01 DIAGNOSIS — K922 Gastrointestinal hemorrhage, unspecified: Secondary | ICD-10-CM | POA: Diagnosis not present

## 2018-09-01 DIAGNOSIS — Z8249 Family history of ischemic heart disease and other diseases of the circulatory system: Secondary | ICD-10-CM

## 2018-09-01 DIAGNOSIS — K2970 Gastritis, unspecified, without bleeding: Secondary | ICD-10-CM

## 2018-09-01 DIAGNOSIS — I428 Other cardiomyopathies: Secondary | ICD-10-CM | POA: Diagnosis not present

## 2018-09-01 DIAGNOSIS — E1122 Type 2 diabetes mellitus with diabetic chronic kidney disease: Secondary | ICD-10-CM | POA: Diagnosis present

## 2018-09-01 DIAGNOSIS — K648 Other hemorrhoids: Secondary | ICD-10-CM | POA: Diagnosis present

## 2018-09-01 DIAGNOSIS — I5022 Chronic systolic (congestive) heart failure: Secondary | ICD-10-CM | POA: Diagnosis present

## 2018-09-01 DIAGNOSIS — D122 Benign neoplasm of ascending colon: Secondary | ICD-10-CM

## 2018-09-01 DIAGNOSIS — K625 Hemorrhage of anus and rectum: Secondary | ICD-10-CM

## 2018-09-01 DIAGNOSIS — Z8349 Family history of other endocrine, nutritional and metabolic diseases: Secondary | ICD-10-CM

## 2018-09-01 DIAGNOSIS — Z8673 Personal history of transient ischemic attack (TIA), and cerebral infarction without residual deficits: Secondary | ICD-10-CM

## 2018-09-01 DIAGNOSIS — Z90710 Acquired absence of both cervix and uterus: Secondary | ICD-10-CM

## 2018-09-01 DIAGNOSIS — Z7982 Long term (current) use of aspirin: Secondary | ICD-10-CM

## 2018-09-01 DIAGNOSIS — Z794 Long term (current) use of insulin: Secondary | ICD-10-CM

## 2018-09-01 DIAGNOSIS — Z87891 Personal history of nicotine dependence: Secondary | ICD-10-CM

## 2018-09-01 DIAGNOSIS — N186 End stage renal disease: Secondary | ICD-10-CM | POA: Diagnosis present

## 2018-09-01 DIAGNOSIS — K449 Diaphragmatic hernia without obstruction or gangrene: Secondary | ICD-10-CM | POA: Diagnosis present

## 2018-09-01 DIAGNOSIS — E785 Hyperlipidemia, unspecified: Secondary | ICD-10-CM | POA: Diagnosis present

## 2018-09-01 DIAGNOSIS — Z841 Family history of disorders of kidney and ureter: Secondary | ICD-10-CM

## 2018-09-01 DIAGNOSIS — K2980 Duodenitis without bleeding: Secondary | ICD-10-CM | POA: Diagnosis present

## 2018-09-01 DIAGNOSIS — K921 Melena: Secondary | ICD-10-CM

## 2018-09-01 DIAGNOSIS — K2990 Gastroduodenitis, unspecified, without bleeding: Secondary | ICD-10-CM | POA: Diagnosis present

## 2018-09-01 DIAGNOSIS — I255 Ischemic cardiomyopathy: Secondary | ICD-10-CM | POA: Diagnosis present

## 2018-09-01 DIAGNOSIS — K210 Gastro-esophageal reflux disease with esophagitis: Secondary | ICD-10-CM | POA: Diagnosis present

## 2018-09-01 DIAGNOSIS — I2510 Atherosclerotic heart disease of native coronary artery without angina pectoris: Secondary | ICD-10-CM | POA: Diagnosis present

## 2018-09-01 DIAGNOSIS — Z888 Allergy status to other drugs, medicaments and biological substances status: Secondary | ICD-10-CM

## 2018-09-01 DIAGNOSIS — Z992 Dependence on renal dialysis: Secondary | ICD-10-CM

## 2018-09-01 HISTORY — DX: Hemorrhage of anus and rectum: K62.5

## 2018-09-01 LAB — COMPREHENSIVE METABOLIC PANEL
ALT: 11 U/L (ref 0–44)
AST: 16 U/L (ref 15–41)
Albumin: 3.3 g/dL — ABNORMAL LOW (ref 3.5–5.0)
Alkaline Phosphatase: 66 U/L (ref 38–126)
Anion gap: 13 (ref 5–15)
BUN: 51 mg/dL — ABNORMAL HIGH (ref 8–23)
CHLORIDE: 93 mmol/L — AB (ref 98–111)
CO2: 27 mmol/L (ref 22–32)
Calcium: 9 mg/dL (ref 8.9–10.3)
Creatinine, Ser: 6.37 mg/dL — ABNORMAL HIGH (ref 0.44–1.00)
GFR calc non Af Amer: 6 mL/min — ABNORMAL LOW (ref >60)
GFR, EST AFRICAN AMERICAN: 7 mL/min — AB (ref >60)
Glucose, Bld: 239 mg/dL — ABNORMAL HIGH (ref 70–99)
POTASSIUM: 4.5 mmol/L (ref 3.5–5.1)
SODIUM: 133 mmol/L — AB (ref 135–145)
Total Bilirubin: 0.9 mg/dL (ref 0.3–1.2)
Total Protein: 6.3 g/dL — ABNORMAL LOW (ref 6.5–8.1)

## 2018-09-01 LAB — HEMOGLOBIN A1C
Hgb A1c MFr Bld: 9.1 % — ABNORMAL HIGH (ref 4.8–5.6)
Mean Plasma Glucose: 214.47 mg/dL

## 2018-09-01 LAB — CBC
HEMATOCRIT: 37.9 % (ref 36.0–46.0)
Hemoglobin: 11.6 g/dL — ABNORMAL LOW (ref 12.0–15.0)
MCH: 30.1 pg (ref 26.0–34.0)
MCHC: 30.6 g/dL (ref 30.0–36.0)
MCV: 98.4 fL (ref 80.0–100.0)
NRBC: 0 % (ref 0.0–0.2)
PLATELETS: 168 10*3/uL (ref 150–400)
RBC: 3.85 MIL/uL — AB (ref 3.87–5.11)
RDW: 13.7 % (ref 11.5–15.5)
WBC: 4.3 10*3/uL (ref 4.0–10.5)

## 2018-09-01 LAB — GLUCOSE, CAPILLARY
Glucose-Capillary: 174 mg/dL — ABNORMAL HIGH (ref 70–99)
Glucose-Capillary: 218 mg/dL — ABNORMAL HIGH (ref 70–99)

## 2018-09-01 LAB — TYPE AND SCREEN
ABO/RH(D): O POS
ANTIBODY SCREEN: NEGATIVE

## 2018-09-01 LAB — POC OCCULT BLOOD, ED: FECAL OCCULT BLD: POSITIVE — AB

## 2018-09-01 MED ORDER — INSULIN DETEMIR 100 UNIT/ML ~~LOC~~ SOLN
15.0000 [IU] | Freq: Every day | SUBCUTANEOUS | Status: DC
  Administered 2018-09-01: 15 [IU] via SUBCUTANEOUS
  Filled 2018-09-01 (×2): qty 0.15

## 2018-09-01 MED ORDER — CARVEDILOL 25 MG PO TABS
25.0000 mg | ORAL_TABLET | Freq: Two times a day (BID) | ORAL | Status: DC
  Administered 2018-09-01 – 2018-09-03 (×4): 25 mg via ORAL
  Filled 2018-09-01 (×4): qty 1

## 2018-09-01 MED ORDER — BRIMONIDINE TARTRATE 0.2 % OP SOLN
1.0000 [drp] | Freq: Two times a day (BID) | OPHTHALMIC | Status: DC
  Administered 2018-09-02 – 2018-09-03 (×4): 1 [drp] via OPHTHALMIC
  Filled 2018-09-01: qty 5

## 2018-09-01 MED ORDER — CALCIUM ACETATE (PHOS BINDER) 667 MG PO CAPS
2001.0000 mg | ORAL_CAPSULE | Freq: Three times a day (TID) | ORAL | Status: DC
  Administered 2018-09-01 – 2018-09-04 (×8): 2001 mg via ORAL
  Filled 2018-09-01 (×8): qty 3

## 2018-09-01 MED ORDER — TIMOLOL MALEATE 0.5 % OP SOLN
1.0000 [drp] | Freq: Two times a day (BID) | OPHTHALMIC | Status: DC
  Administered 2018-09-02 – 2018-09-03 (×4): 1 [drp] via OPHTHALMIC
  Filled 2018-09-01 (×2): qty 5

## 2018-09-01 MED ORDER — CHLORHEXIDINE GLUCONATE CLOTH 2 % EX PADS
6.0000 | MEDICATED_PAD | Freq: Every day | CUTANEOUS | Status: DC
  Administered 2018-09-02 – 2018-09-03 (×2): 6 via TOPICAL

## 2018-09-01 MED ORDER — ONDANSETRON HCL 4 MG PO TABS
4.0000 mg | ORAL_TABLET | Freq: Four times a day (QID) | ORAL | Status: DC | PRN
  Administered 2018-09-02 – 2018-09-03 (×2): 4 mg via ORAL
  Filled 2018-09-01 (×2): qty 1

## 2018-09-01 MED ORDER — LATANOPROST 0.005 % OP SOLN
1.0000 [drp] | Freq: Every evening | OPHTHALMIC | Status: DC
  Administered 2018-09-01 – 2018-09-04 (×4): 1 [drp] via OPHTHALMIC
  Filled 2018-09-01: qty 2.5

## 2018-09-01 MED ORDER — SODIUM CHLORIDE 0.9% FLUSH
3.0000 mL | Freq: Two times a day (BID) | INTRAVENOUS | Status: DC
  Administered 2018-09-02 – 2018-09-03 (×4): 3 mL via INTRAVENOUS

## 2018-09-01 MED ORDER — SODIUM CHLORIDE 0.9% FLUSH
3.0000 mL | INTRAVENOUS | Status: DC | PRN
  Administered 2018-09-01: 3 mL via INTRAVENOUS
  Filled 2018-09-01: qty 3

## 2018-09-01 MED ORDER — ACETAMINOPHEN 325 MG PO TABS: 650.0000 mg | ORAL_TABLET | Freq: Four times a day (QID) | ORAL | Status: DC | PRN

## 2018-09-01 MED ORDER — GABAPENTIN 100 MG PO CAPS
100.0000 mg | ORAL_CAPSULE | Freq: Every day | ORAL | Status: DC
  Administered 2018-09-01 – 2018-09-03 (×3): 100 mg via ORAL
  Filled 2018-09-01 (×3): qty 1

## 2018-09-01 MED ORDER — BRIMONIDINE TARTRATE-TIMOLOL 0.2-0.5 % OP SOLN
1.0000 [drp] | Freq: Two times a day (BID) | OPHTHALMIC | Status: DC
  Administered 2018-09-01: 1 [drp] via OPHTHALMIC

## 2018-09-01 MED ORDER — HYDRALAZINE HCL 50 MG PO TABS
50.0000 mg | ORAL_TABLET | Freq: Three times a day (TID) | ORAL | Status: DC | PRN
  Administered 2018-09-01: 50 mg via ORAL
  Filled 2018-09-01: qty 1

## 2018-09-01 MED ORDER — INSULIN ASPART 100 UNIT/ML ~~LOC~~ SOLN
0.0000 [IU] | Freq: Three times a day (TID) | SUBCUTANEOUS | Status: DC
  Administered 2018-09-01 – 2018-09-03 (×3): 2 [IU] via SUBCUTANEOUS

## 2018-09-01 MED ORDER — ONDANSETRON HCL 4 MG/2ML IJ SOLN: 4.0000 mg | Freq: Four times a day (QID) | INTRAMUSCULAR | Status: DC | PRN

## 2018-09-01 MED ORDER — SODIUM CHLORIDE 0.9 % IV SOLN
250.0000 mL | INTRAVENOUS | Status: DC | PRN
  Administered 2018-09-04: 10:00:00 via INTRAVENOUS

## 2018-09-01 NOTE — H&P (Addendum)
TRH H&P    Patient Demographics:    Kathleen [name], is a 73 y.o. female  MRN: [id number]  DOB - 24-Jan-1946  Admit Date - 09/01/2018  Referring MD/NP/PA: Dr. Billy Fischer  Outpatient Primary MD for the patient is Benito Mccreedy, MD  Patient coming from: Home  Chief complaint-rectal bleeding   HPI:    Kathleen [name]  is a 73 y.o. female, with history of ESRD on hemodialysis Tuesday Thursday Saturday, diabetes mellitus type 2, CAD, hypertension, hyperlipidemia came to hospital with 4-day history of noticing blood on the toilet paper.  Patient says that this morning she woke up and felt weak, did not go for dialysis.  Came to ED for further evaluation.  Patient denies abdominal pain.  No nausea vomiting or diarrhea.  Did not pass out.  No blurred vision. Patient had a similar presentation in 2017 at that time CT scan of the abdomen showed diverticulosis. Patient also had colonoscopy within the past 10 years,(no records available) which was normal as per patient. She has no previous history of hemorrhoids. Denies chest pain or shortness of breath. No previous history of stroke or seizures. In the ED lab work showed hemoglobin 11.6.     Review of systems:    In addition to the HPI above,    All other systems reviewed and are negative.    Past History of the following :    Past Medical History:  Diagnosis Date  . Anemia    a. 01/2014: suspected of chronic disease, negative FOBT.  [name]    knees  . Asthma    many years ago  . CAD (coronary artery disease)    a. Cath 01/2014: mild in LAD, mild-mod LCx, mod-severe RCA - for med rx.  . Diabetes mellitus without complication ([place])   . ESRD (end stage renal disease) ([place])    [place]  . History of echocardiogram    Echo (10/15):  Mod LVH, EF 50-55%, Gr 1 DD, MAC, mild MR, mild TR, PASP 35 mmHg  . Hx of transfusion   .  Hyperlipidemia   . Hypertension   . Meningioma ([place])    a. Incidental dx 01/2014.  . Obesity   . Renal insufficiency   . Systolic CHF ([place])    a. 08/1550: dx with mixed ICM/NICM (out of proportion to CAD) EF 25-30% by echo.       Past Surgical History:  Procedure Laterality Date  . ABDOMINAL HYSTERECTOMY    . AV FISTULA PLACEMENT Left 02/10/2017   Procedure: ARTERIOVENOUS (AV) FISTULA CREATION-LEFT FOREARM;  Surgeon: Elam Dutch, MD;  Location: [facility];  Service: Vascular;  Laterality: Left;  . BREAST SURGERY Right    Lumpectomy  . COLONOSCOPY    . FISTULA SUPERFICIALIZATION Left 08/09/2017   Procedure: FISTULA SUPERFICIALIZATION LEFT ARM ARTERIOVENOUS FISTULA;  Surgeon: Conrad Catawba, MD;  Location: [facility];  Service: Vascular;  Laterality: Left;  . INSERTION OF DIALYSIS CATHETER Right 02/10/2017   Procedure: INSERTION OF DIALYSIS CATHETER;  Surgeon: Elam Dutch, MD;  Location: [facility] OR;  Service: Vascular;  Laterality: Right;  . LEFT HEART CATHETERIZATION WITH CORONARY ANGIOGRAM N/A 01/27/2014   Procedure: LEFT HEART CATHETERIZATION WITH CORONARY ANGIOGRAM;  Surgeon: Jettie Booze, MD;  Location: [facility] C[facility] LAB;  Service: Cardiovascular;  Laterality: N/A;  . TONSILLECTOMY        Social History:      Social History   Tobacco Use  . Smoking status: Former Smoker    Packs/day: 0.25    Years: 30.00    Pack years: 7.50    Types: Cigarettes  . Smokeless tobacco: Never Used  . Tobacco comment: quit late 1990's  Substance Use Topics  . Alcohol use: No       Family History :     Family History  Problem Relation Age of Onset  . Diabetic kidney disease Mother   . Hypertension Mother   . Heart failure Mother   . Heart attack Mother   . Hyperlipidemia Mother   . Hypertension Father       Home Medications:   Prior to Admission medications   Medication Sig Start Date End Date Taking? Authorizing Provider  aspirin 81 MG EC tablet Take 4 tablets (325 mg total) by  mouth daily. Patient taking differently: Take 162 mg by mouth daily.  11/11/17  Yes [name], [name], MD  brimonidine-timolol (COMBIGAN) 0.2-0.5 % ophthalmic solution Place 1 drop into both eyes every 12 (twelve) hours.    Yes [provider]  calcium acetate (PHOSLO) 667 MG capsule Take 1 capsule (667 mg total) by mouth as needed (with snacks). Patient taking differently: Take 667-2,001 mg by mouth See admin instructions. Take 3 capsules (2072m) with a meal three times a day. Take 667 mg with snack 11/11/17  Yes [name], [name] MD  carvedilol (COREG) 25 MG tablet Take 25 mg by mouth 2 (two) times daily. 03/28/17  Yes [provider]  gabapentin (NEURONTIN) 100 MG capsule Take 100 mg by mouth at bedtime. 10/18/17  Yes [provider]  hydrALAZINE (APRESOLINE) 25 MG tablet Take 1 tablet (25 mg total) by mouth every 8 (eight) hours as needed (SBP > 170  or DBP > 110). Patient taking differently: Take 50 mg by mouth every 8 (eight) hours as needed (SBP > 170  or DBP > 110).  02/20/17  Yes Mikhail, [name], DO  insulin detemir (LEVEMIR) 100 UNIT/ML injection Inject 0.15 mLs (15 Units total) into the skin at bedtime. Patient taking differently: Inject 15 Units into the skin at bedtime.  02/20/17  Yes Mikhail, [name], DO  latanoprost (XALATAN) 0.005 % ophthalmic solution Place 1 drop into both eyes every evening.   Yes [provider]  Multiple Vitamins-Minerals (RENAPLEX) TABS Take 1 tablet by mouth daily. 07/10/18  Yes [provider]  acetaminophen (TYLENOL) 325 MG tablet Take 2 tablets (650 mg total) by mouth every 4 (four) hours as needed for mild pain (or temp > 37.5 C (99.5 F)). 11/11/17   RDebbe Odea MD  atorvastatin (LIPITOR) 40 MG tablet Take 1 tablet (40 mg total) by mouth daily at 6 PM. Patient not taking: Reported on 09/01/2018 07/19/17   [name] MD  calcitRIOL (ROCALTROL) 0.5 MCG capsule Take 1 capsule (0.5 mcg total) by mouth Every  Tuesday,Thursday,and Saturday with dialysis. Patient not taking: Reported on 11/08/2017 02/21/17   MCristal Ford DO  multivitamin (RENA-VIT) TABS tablet Take 1 tablet by mouth at bedtime. Patient not taking: Reported on 09/01/2018 02/20/17   MCristal Ford DO  polyethylene glycol (  MIRALAX / GLYCOLAX) packet Take 17 g by mouth daily. Patient taking differently: Take 17 g by mouth daily as needed for mild constipation.  02/20/17   Cristal Ford, DO     Allergies:     Allergies  Allergen Reactions  . Baclofen     Severe delirium when given to pateint in Dec 2018.      Physical Exam:   Vitals  Blood pressure (!) 170/68, pulse 65, temperature 97.7 F (36.5 C), temperature source Oral, resp. rate 16, height 5' (1.524 m), weight 83.9 kg, SpO2 96 %.  1.  General: Appears calm and comfortable  2. Psychiatric: Alert, oriented x3, intact insight and judgment.  Appropriate mood and affect.  3. Neurologic: Cranial nerve II through grossly intact, motor strength is 5/5 in all extremities.  No focal deficit noted.  4. HEENMT:  Atraumatic normocephalic.  Oral mucosa is moist.PERRLA.  5. Respiratory : Clear to auscultation bilaterally.  No wheezing or crackles auscultated.  6. Cardiovascular : S1-S2, regular, grade 2/6 murmur auscultated at mitral and aortic area.  7. Gastrointestinal:  Abdomen is soft, nontender, no organomegaly      Data Review:    CBC Recent Labs  Lab 09/01/18 1145  WBC 4.3  HGB 11.6*  HCT 37.9  PLT 168  MCV 98.4  MCH 30.1  MCHC 30.6  RDW 13.7   ------------------------------------------------------------------------------------------------------------------  Results for orders placed or performed during the hospital encounter of 09/01/18 (from the past 48 hour(s))  Type and screen [place]     Status: None   Collection Time: 09/01/18 11:40 AM  Result Value Ref Range   ABO/RH(D) O POS    Antibody Screen NEG    Sample  Expiration      09/04/2018 Performed at [facility], [place] [address] [place], [place] [address]   Comprehensive metabolic panel     Status: Abnormal   Collection Time: 09/01/18 11:45 AM  Result Value Ref Range   Sodium 133 (L) 135 - 145 mmol/L   Potassium 4.5 3.5 - 5.1 mmol/L   Chloride 93 (L) 98 - 111 mmol/L   CO2 27 22 - 32 mmol/L   Glucose, Bld 239 (H) 70 - 99 mg/dL   BUN 51 (H) 8 - 23 mg/dL   Creatinine, Ser 6.37 (H) 0.44 - 1.00 mg/dL   Calcium 9.0 8.9 - 10.3 mg/dL   Total Protein 6.3 (L) 6.5 - 8.1 g/dL   Albumin 3.3 (L) 3.5 - 5.0 g/dL   AST 16 15 - 41 U/L   ALT 11 0 - 44 U/L   Alkaline Phosphatase 66 38 - 126 U/L   Total Bilirubin 0.9 0.3 - 1.2 mg/dL   GFR calc non Af Amer 6 (L) >60 mL/min   GFR calc Af Amer 7 (L) >60 mL/min   Anion gap 13 5 - 15    Comment: Performed at [facility], [place] [address] [place],  [address]  CBC     Status: Abnormal   Collection Time: 09/01/18 11:45 AM  Result Value Ref Range   WBC 4.3 4.0 - 10.5 K/uL   RBC 3.85 (L) 3.87 - 5.11 MIL/uL   Hemoglobin 11.6 (L) 12.0 - 15.0 g/dL   HCT 37.9 36.0 - 46.0 %   MCV 98.4 80.0 - 100.0 fL   MCH 30.1 26.0 - 34.0 pg   MCHC 30.6 30.0 - 36.0 g/dL   RDW 13.7 11.5 - 15.5 %   Platelets 168 150 -  400 K/uL   nRBC 0.0 0.0 - 0.2 %    Comment: Performed at [facility], [place] [address] [place], [place] [address]  POC occult blood, ED     Status: Abnormal   Collection Time: 09/01/18 12:13 PM  Result Value Ref Range   Fecal Occult Bld POSITIVE (A) NEGATIVE    Chemistries  Recent Labs  Lab 09/01/18 1145  NA 133*  K 4.5  CL 93*  CO2 27  GLUCOSE 239*  BUN 51*  CREATININE 6.37*  CALCIUM 9.0  AST 16  ALT 11  ALKPHOS 66  BILITOT 0.9   ------------------------------------------------------------------------------------------------------------------  ------------------------------------------------------------------------------------------------------------------ GFR: Estimated  Creatinine Clearance: 7.6 mL/min (A) (by C-G formula based on SCr of 6.37 mg/dL (H)). Liver Function Tests: Recent Labs  Lab 09/01/18 1145  AST 16  ALT 11  ALKPHOS 66  BILITOT 0.9  PROT 6.3*  ALBUMIN 3.3*    --------------------------------------------------------------------------------------------------------------- Urine analysis:    Component Value Date/Time   COLORURINE YELLOW 07/13/2017 2335   APPEARANCEUR CLEAR 07/13/2017 2335   LABSPEC 1.009 07/13/2017 2335   PHURINE 6.0 07/13/2017 2335   GLUCOSEU NEGATIVE 07/13/2017 2335   HGBUR NEGATIVE 07/13/2017 2335   BILIRUBINUR NEGATIVE 07/13/2017 2335   KETONESUR NEGATIVE 07/13/2017 2335   PROTEINUR 100 (A) 07/13/2017 2335   UROBILINOGEN 0.2 06/11/2014 0639   NITRITE NEGATIVE 07/13/2017 2335   LEUKOCYTESUR NEGATIVE 07/13/2017 2335      Imaging Results:      Assessment & Plan:    Active Problems:   Rectal bleeding   1. Rectal bleeding-patient has history of diverticulosis, Reviewed the CT abdomen and pelvis from 2017 which showed extensive colonic diverticulosis.  Hemoglobin is stable.  Patient has not had bleeding since she came to the hospital.  Will place under observation, check CBC in a.m.  Hemodynamically patient is stable.  Will hold aspirin.  2. Anemia-hemoglobin is 11.6.  Which is better than previous hemoglobin 9.1 from April 2019.  Follow CBC in a.m.  3. ESRD on hemodialysis-patient is on hemodialysis Tuesday Thursday and Saturday.  She missed her dialysis today.  Nephrology has been consulted for dialysis tonight.  4. Diabetes mellitus type 2-continue home Levemir 15 units subcu daily.  Will start sliding scale insulin with NovoLog.  Patient started on clear liquid diet.  Check CBG q. before meals and at bedtime.  5. Hypertension-blood pressure stable, continue home medications Coreg 25 mg twice a day   DVT Prophylaxis-   SCDs   AM Labs Ordered, also please review Full Orders  Family Communication:  Admission, patients condition and plan of care including tests being ordered have been discussed with the patient  who indicate understanding and agree with the plan and Code Status.  Code Status: Full code  Admission status: Observation/Inpatient: Based on patients clinical presentation and evaluation of above clinical data, I have made determination that patient will need less than 2 midnight stay in the hospital.  Time spent in minutes : 60 minutes   Oswald Hillock M.D on 09/01/2018 at 4:15 PM

## 2018-09-01 NOTE — ED Provider Notes (Signed)
[facility]  SURGICAL Provider Note   CSN: [id number] Arrival date & time: 09/01/18  1116     History   Chief Complaint Chief Complaint  Patient presents with  . Rectal Bleeding    HPI Kathleen [name] is a 73 y.o. female.  HPI   73yo female with history of ESRD on dialysis T-Th-S, CHF, DM, htn, CAD, diverticulosis, presents with concern for rectal bleeding.  Reports some blood on tissue paper over the last week, however had one episode last week of blood in the toilet, and another episode yesterday of blood in the toilet. Reports frank blood in toilet bowel along with BM.  Reports lumpy stool, on my history reports it was brown in color.  On my initial history reports only dizziness when she goes from laying to sitting too quickly, however reported severe fatigue, dizziness, and not feeling well to nursing staff. Reports mild dyspnea. Denies abdominal pain, nausea, vomiting, history of recent constipation or diarrhea, hx of hemorrhoids or rectal pain. No syncope.   Reports has had history of similar bleeding, chart review shows was in May 2017. Diverticula noted on CT.  Past Medical History:  Diagnosis Date  . Anemia    a. 01/2014: suspected of chronic disease, negative FOBT.  [name]    knees  . Asthma    many years ago  . CAD (coronary artery disease)    a. Cath 01/2014: mild in LAD, mild-mod LCx, mod-severe RCA - for med rx.  . Diabetes mellitus without complication ([place])   . ESRD (end stage renal disease) ([place])    [place]  . History of echocardiogram    Echo (10/15):  Mod LVH, EF 50-55%, Gr 1 DD, MAC, mild MR, mild TR, PASP 35 mmHg  . Hx of transfusion   . Hyperlipidemia   . Hypertension   . Meningioma ([place])    a. Incidental dx 01/2014.  . Obesity   . Renal insufficiency   . Systolic CHF ([place])    a. 09/7251: dx with mixed ICM/NICM (out of proportion to CAD) EF 25-30% by echo.     Patient Active Problem List   Diagnosis Date Noted  . Rectal bleeding 09/01/2018  . CVA (cerebral vascular accident) ([place]) 11/09/2017  . Acute CVA (cerebrovascular accident) ([place]) 11/08/2017  . DM (diabetes mellitus), type 2 with renal complications ([place]) 66/44/0347  . Altered mental status 07/14/2017  . Knee pain   . Acute lower UTI   . Peripheral arterial disease ([place]) 02/23/2016  . Insulin dependent diabetes mellitus ([place]) 12/18/2015  . Anemia in chronic kidney disease 12/18/2015  . Dyspnea   . Goals of care, counseling/discussion   . SOB (shortness of breath)   . Encounter for palliative care   . Pulmonary vascular congestion   . Pleural effusion, left   . HLD (hyperlipidemia)   . Noncompliance with medication regimen   . Disorientation   . Somnolence   . Acute respiratory failure with hypercapnia ([place])   . Respiratory acidosis   . Hypernatremia   . Systolic and diastolic CHF, acute ([place])   . CHF, acute on chronic ([place]) 08/05/2015  . Elevated troponin 08/05/2015  . CHF (congestive heart failure) ([place]) 08/05/2015  . Acute on chronic heart failure ([place]) 08/05/2015  . Acute on chronic combined systolic and diastolic congestive heart failure ([place])   . Left carotid bruit 11/20/2014  . Syncope and collapse 06/10/2014  . Syncope 06/10/2014  . Essential hypertension   .  Unspecified vitamin D deficiency   . Cellulitis and abscess of right lower extremitiy 02/28/2014  . Chronic combined systolic and diastolic heart failure ([place]) 02/12/2014  . CAD (coronary artery disease)   . Obesity   . Meningioma ([place])   . Anemia   . ESRD on dialysis ([place]) 01/24/2014  . Hyperlipidemia 01/22/2014  . BPPV (benign paroxysmal positional vertigo) 01/22/2014  . Hypertensive urgency 01/21/2014  . Tobacco abuse 01/21/2014  . Diabetes mellitus due to abnormal insulin ([place]) 01/21/2014    Past Surgical History:  Procedure Laterality Date  . ABDOMINAL HYSTERECTOMY    . AV FISTULA PLACEMENT Left 02/10/2017   Procedure:  ARTERIOVENOUS (AV) FISTULA CREATION-LEFT FOREARM;  Surgeon: Elam Dutch, MD;  Location: [facility];  Service: Vascular;  Laterality: Left;  . BREAST SURGERY Right    Lumpectomy  . COLONOSCOPY    . FISTULA SUPERFICIALIZATION Left 08/09/2017   Procedure: FISTULA SUPERFICIALIZATION LEFT ARM ARTERIOVENOUS FISTULA;  Surgeon: Conrad San Isidro, MD;  Location: [facility];  Service: Vascular;  Laterality: Left;  . INSERTION OF DIALYSIS CATHETER Right 02/10/2017   Procedure: INSERTION OF DIALYSIS CATHETER;  Surgeon: Elam Dutch, MD;  Location: [facility];  Service: Vascular;  Laterality: Right;  . LEFT HEART CATHETERIZATION WITH CORONARY ANGIOGRAM N/A 01/27/2014   Procedure: LEFT HEART CATHETERIZATION WITH CORONARY ANGIOGRAM;  Surgeon: Jettie Booze, MD;  Location[facility] C[facility] LAB;  Service: Cardiovascular;  Laterality: N/A;  . TONSILLECTOMY       OB History   No obstetric history on file.      Home Medications    Prior to Admission medications   Medication Sig Start Date End Date Taking? Authorizing Provider  aspirin 81 MG EC tablet Take 4 tablets (325 mg total) by mouth daily. Patient taking differently: Take 162 mg by mouth daily.  11/11/17  Yes [name], [name], MD  brimonidine-timolol (COMBIGAN) 0.2-0.5 % ophthalmic solution Place 1 drop into both eyes every 12 (twelve) hours.    Yes [provider]  calcium acetate (PHOSLO) 667 MG capsule Take 1 capsule (667 mg total) by mouth as needed (with snacks). Patient taking differently: Take 667-2,001 mg by mouth See admin instructions. Take 3 capsules (2055m) with a meal three times a day. Take 667 mg with snack 11/11/17  Yes [name], [name] MD  carvedilol (COREG) 25 MG tablet Take 25 mg by mouth 2 (two) times daily. 03/28/17  Yes [provider]  gabapentin (NEURONTIN) 100 MG capsule Take 100 mg by mouth at bedtime. 10/18/17  Yes [provider]  hydrALAZINE (APRESOLINE) 25 MG tablet Take 1 tablet (25 mg total) by mouth every 8 (eight)  hours as needed (SBP > 170  or DBP > 110). Patient taking differently: Take 50 mg by mouth every 8 (eight) hours as needed (SBP > 170  or DBP > 110).  02/20/17  Yes Mikhail, [name], DO  insulin detemir (LEVEMIR) 100 UNIT/ML injection Inject 0.15 mLs (15 Units total) into the skin at bedtime. Patient taking differently: Inject 15 Units into the skin at bedtime.  02/20/17  Yes Mikhail, [name], DO  latanoprost (XALATAN) 0.005 % ophthalmic solution Place 1 drop into both eyes every evening.   Yes [provider]  Multiple Vitamins-Minerals (RENAPLEX) TABS Take 1 tablet by mouth daily. 07/10/18  Yes [provider]  acetaminophen (TYLENOL) 325 MG tablet Take 2 tablets (650 mg total) by mouth every 4 (four) hours as needed for mild pain (or temp > 37.5 C (99.5 F)). 11/11/17  [name], MD  atorvastatin (LIPITOR) 40 MG tablet Take 1 tablet (40 mg total) by mouth daily at 6 PM. Patient not taking: Reported on 09/01/2018 07/19/17   Aline August, MD  calcitRIOL (ROCALTROL) 0.5 MCG capsule Take 1 capsule (0.5 mcg total) by mouth Every Tuesday,Thursday,and Saturday with dialysis. Patient not taking: Reported on 11/08/2017 02/21/17   Cristal Ford, DO  multivitamin (RENA-VIT) TABS tablet Take 1 tablet by mouth at bedtime. Patient not taking: Reported on 09/01/2018 02/20/17   Cristal Ford, DO  polyethylene glycol [facility] / GLYCOLAX) packet Take 17 g by mouth daily. Patient taking differently: Take 17 g by mouth daily as needed for mild constipation.  02/20/17   Cristal Ford, DO    Family History Family History  Problem Relation Age of Onset  . Diabetic kidney disease Mother   . Hypertension Mother   . Heart failure Mother   . Heart attack Mother   . Hyperlipidemia Mother   . Hypertension Father     Social History Social History   Tobacco Use  . Smoking status: Former Smoker    Packs/day: 0.25    Years: 30.00    Pack years: 7.50    Types: Cigarettes  . Smokeless  tobacco: Never Used  . Tobacco comment: quit late 1990's  Substance Use Topics  . Alcohol use: No  . Drug use: No     Allergies   Baclofen   Review of Systems Review of Systems  Constitutional: Positive for fatigue. Negative for fever.  HENT: Negative for sore throat.   Eyes: Negative for visual disturbance.  Respiratory: Positive for shortness of breath. Negative for cough.   Cardiovascular: Negative for chest pain.  Gastrointestinal: Positive for anal bleeding and blood in stool. Negative for abdominal pain, constipation, diarrhea, nausea and vomiting.  Musculoskeletal: Negative for back pain.  Skin: Negative for rash.  Neurological: Positive for light-headedness. Negative for syncope and headaches.     Physical Exam Updated Vital Signs BP (!) 161/63 (BP Location: Right Arm)   Pulse 65   Temp 97.9 F (36.6 C) (Oral)   Resp 17   Ht _0  (1.6 m)   Wt 88.8 kg   LMP  (LMP Unknown)   SpO2 96%   BMI 34.68 kg/m   Physical Exam Vitals signs and nursing note reviewed.  Constitutional:      General: She is not in acute distress.    Appearance: She is well-developed. She is not diaphoretic.  HENT:     Head: Normocephalic and atraumatic.  Eyes:     Conjunctiva/sclera: Conjunctivae normal.  Neck:     Musculoskeletal: Normal range of motion.  Cardiovascular:     Rate and Rhythm: Normal rate and regular rhythm.     Heart sounds: Normal heart sounds. No murmur. No friction rub. No gallop.   Pulmonary:     Effort: Pulmonary effort is normal. No respiratory distress.     Breath sounds: Normal breath sounds. No wheezing or rales.  Abdominal:     General: There is no distension.     Palpations: Abdomen is soft.     Tenderness: There is no abdominal tenderness. There is no guarding.  Genitourinary:    Comments: No external hemorrhoids Stool appeared dark brown but on hemoccult card appeared more red/maroon  Musculoskeletal:        General: No tenderness.  Skin:     General: Skin is warm and dry.     Findings: No erythema or rash.  Neurological:  Mental Status: She is alert and oriented to person, place, and time.      ED Treatments / Results  Labs (all labs ordered are listed, but only abnormal results are displayed) Labs Reviewed  COMPREHENSIVE METABOLIC PANEL - Abnormal; Notable for the following components:      Result Value   Sodium 133 (*)    Chloride 93 (*)    Glucose, Bld 239 (*)    BUN 51 (*)    Creatinine, Ser 6.37 (*)    Total Protein 6.3 (*)    Albumin 3.3 (*)    GFR calc non Af Amer 6 (*)    GFR calc Af Amer 7 (*)    All other components within normal limits  CBC - Abnormal; Notable for the following components:   RBC 3.85 (*)    Hemoglobin 11.6 (*)    All other components within normal limits  HEMOGLOBIN A1C - Abnormal; Notable for the following components:   Hgb A1c MFr Bld 9.1 (*)    All other components within normal limits  GLUCOSE, CAPILLARY - Abnormal; Notable for the following components:   Glucose-Capillary 174 (*)    All other components within normal limits  GLUCOSE, CAPILLARY - Abnormal; Notable for the following components:   Glucose-Capillary 218 (*)    All other components within normal limits  POC OCCULT BLOOD, ED - Abnormal; Notable for the following components:   Fecal Occult Bld POSITIVE (*)    All other components within normal limits  CBC  COMPREHENSIVE METABOLIC PANEL  TYPE AND SCREEN    EKG None  Radiology No results found.  Procedures Procedures (including critical care time)  Medications Ordered in ED Medications  acetaminophen (TYLENOL) tablet 650 mg (has no administration in time range)  carvedilol (COREG) tablet 25 mg (25 mg Oral Given 09/01/18 2129)  hydrALAZINE (APRESOLINE) tablet 50 mg (50 mg Oral Given 09/01/18 1838)  insulin detemir (LEVEMIR) injection 15 Units (15 Units Subcutaneous Given 09/01/18 2207)  calcium acetate (PHOSLO) capsule 2,001 mg (2,001 mg Oral Given  09/01/18 1815)  gabapentin (NEURONTIN) capsule 100 mg (100 mg Oral Given 09/01/18 2129)  brimonidine-timolol (COMBIGAN) 0.2-0.5 % ophthalmic solution 1 drop (1 drop Both Eyes Given 09/01/18 1823)  latanoprost (XALATAN) 0.005 % ophthalmic solution 1 drop (1 drop Both Eyes Given 09/01/18 1813)  sodium chloride flush (NS) 0.9 % injection 3 mL (0 mLs Intravenous Duplicate 10/27/00 5427)  sodium chloride flush (NS) 0.9 % injection 3 mL (3 mLs Intravenous Given 09/01/18 2129)  0.9 %  sodium chloride infusion (has no administration in time range)  ondansetron (ZOFRAN) tablet 4 mg (has no administration in time range)    Or  ondansetron (ZOFRAN) injection 4 mg (has no administration in time range)  insulin aspart (novoLOG) injection 0-9 Units (2 Units Subcutaneous Given 09/01/18 1812)  Chlorhexidine Gluconate Cloth 2 % PADS 6 each (has no administration in time range)     Initial Impression / Assessment and Plan / ED Course  I have reviewed the triage vital signs and the nursing notes.  Pertinent labs & imaging results that were available during my care of the patient were reviewed by me and considered in my medical decision making (see chart for details).      73yo female with history of ESRD on dialysis T-Th-S, CHF, DM, htn, CAD, diverticulosis, presents with concern for rectal bleeding.  Initial blood pressures low on arrival to 83/52, then 100s/50s prior to improving to normal blood pressures. Hgb stable.  No hemorrhoids on exam, stool appereared brown however when applying to hemoccult card appearead more maroon in color.   Given exam more concerning for colonic or diverticular source, initial hypotension on arrival, history of known diverticula, feel observation admission is appropriate.    Final Clinical Impressions(s) / ED Diagnoses   Final diagnoses:  Gastrointestinal hemorrhage, unspecified gastrointestinal hemorrhage type    ED Discharge Orders    None       Gareth Morgan,  MD 09/01/18 2220

## 2018-09-01 NOTE — Consult Note (Signed)
Renal Service Consult Note [facility]  Kathleen [name] 09/01/2018 Sol [place]azing Requesting Physician:  Dr Darrick Meigs  Reason for Consult:  ESRD pt w/ GI bleed HPI: The patient is a 73 y.o. year-old w/ hx HTN, DM2, ESRD on HD, combined CHF admitted today thru ED for rectal bleeding, Hb 11, hx diverticulosis. ASA put on hold, pt admitted. Asked to see for ESRD.  Usual HD is TTS.    Pt describe bloody stool for the last few days.  No abd pain, no diarrhea or fevers.  Has not missed any HD other than today.    Pt is divorced, lives w/ her son in [place].  No tob Domenic Moras.  Started HD 2 yrs ago. Goes to [facility] TTS>     Old chart:   june 2015 - admit for vertigo,  secondary CM, HTN urgency, DM2, CKD 10 Jun 2014 admit for syncope, mild ICA dz by dopplers; EEG neg. MRI neg.   jan 2017 admit for AMS d/t hypercapnia, resolved; aki/ ckd 4, chf exac diuresed  may 2017 admit for acute GIB, anemia, CKD 4, combined CHF, HTN/DM  admit jul 2018 for acute/ chron CHF, CKD 4, HTN urgency > HD started, +UTI  admit dec 2018 for AMS , ^'d BP's. MRI neg. Baclofen likely culprit, dc'd. Better.   admit apr 2019 dizzy, blurred vision, +MRI for CVA lacunar. Also found to have cervical cord compression /myelopathy/ worsening gait; seen by nsurg not clear plan from dc summary  ROS  denies CP  no joint pain   no HA  no blurry vision  no rash  no diarrhea  no nausea/ vomiting    Past Medical History  Past Medical History:  Diagnosis Date  . Anemia    a. 01/2014: suspected of chronic disease, negative FOBT.  [name]    knees  . Asthma    many years ago  . CAD (coronary artery disease)    a. Cath 01/2014: mild in LAD, mild-mod LCx, mod-severe RCA - for med rx.  . Diabetes mellitus without complication ([place])   . ESRD (end stage renal disease) ([place])    [place]  . History of echocardiogram    Echo (10/15):  Mod LVH, EF 50-55%, Gr 1 DD, MAC, mild MR, mild TR, PASP 35 mmHg  . Hx  of transfusion   . Hyperlipidemia   . Hypertension   . Meningioma ([place])    a. Incidental dx 01/2014.  . Obesity   . Renal insufficiency   . Systolic CHF ([place])    a. 01/5783: dx with mixed ICM/NICM (out of proportion to CAD) EF 25-30% by echo.    Past Surgical History  Past Surgical History:  Procedure Laterality Date  . ABDOMINAL HYSTERECTOMY    . AV FISTULA PLACEMENT Left 02/10/2017   Procedure: ARTERIOVENOUS (AV) FISTULA CREATION-LEFT FOREARM;  Surgeon: Elam Dutch, MD;  Location: [facility];  Service: Vascular;  Laterality: Left;  . BREAST SURGERY Right    Lumpectomy  . COLONOSCOPY    . FISTULA SUPERFICIALIZATION Left 08/09/2017   Procedure: FISTULA SUPERFICIALIZATION LEFT ARM ARTERIOVENOUS FISTULA;  Surgeon: Conrad Chatham, MD;  Location: [facility];  Service: Vascular;  Laterality: Left;  . INSERTION OF DIALYSIS CATHETER Right 02/10/2017   Procedure: INSERTION OF DIALYSIS CATHETER;  Surgeon: Elam Dutch, MD;  Location: [facility];  Service: Vascular;  Laterality: Right;  . LEFT HEART CATHETERIZATION WITH CORONARY ANGIOGRAM N/A 01/27/2014   Procedure: LEFT HEART CATHETERIZATION  WITH CORONARY ANGIOGRAM;  Surgeon: Jettie Booze, MD;  Location[facility] CATH LAB;  Service: Cardiovascular;  Laterality: N/A;  . TONSILLECTOMY     Family History  Family History  Problem Relation Age of Onset  . Diabetic kidney disease Mother   . Hypertension Mother   . Heart failure Mother   . Heart attack Mother   . Hyperlipidemia Mother   . Hypertension Father    Social History  reports that she has quit smoking. Her smoking use included cigarettes. She has a 7.50 pack-year smoking history. She has never used smokeless tobacco. She reports that she does not drink alcohol or use drugs. Allergies  Allergies  Allergen Reactions  . Baclofen     Severe delirium when given to pateint in Dec 2018.    Home medications Prior to Admission medications   Medication Sig Start Date End Date Taking? Authorizing  Provider  aspirin 81 MG EC tablet Take 4 tablets (325 mg total) by mouth daily. Patient taking differently: Take 162 mg by mouth daily.  11/11/17  Yes [name], [name], MD  brimonidine-timolol (COMBIGAN) 0.2-0.5 % ophthalmic solution Place 1 drop into both eyes every 12 (twelve) hours.    Yes [provider]  calcium acetate (PHOSLO) 667 MG capsule Take 1 capsule (667 mg total) by mouth as needed (with snacks). Patient taking differently: Take 667-2,001 mg by mouth See admin instructions. Take 3 capsules (206m) with a meal three times a day. Take 667 mg with snack 11/11/17  Yes [name], [name] MD  carvedilol (COREG) 25 MG tablet Take 25 mg by mouth 2 (two) times daily. 03/28/17  Yes [provider]  gabapentin (NEURONTIN) 100 MG capsule Take 100 mg by mouth at bedtime. 10/18/17  Yes [provider]  hydrALAZINE (APRESOLINE) 25 MG tablet Take 1 tablet (25 mg total) by mouth every 8 (eight) hours as needed (SBP > 170  or DBP > 110). Patient taking differently: Take 50 mg by mouth every 8 (eight) hours as needed (SBP > 170  or DBP > 110).  02/20/17  Yes Mikhail, [name], DO  insulin detemir (LEVEMIR) 100 UNIT/ML injection Inject 0.15 mLs (15 Units total) into the skin at bedtime. Patient taking differently: Inject 15 Units into the skin at bedtime.  02/20/17  Yes Mikhail, [name], DO  latanoprost (XALATAN) 0.005 % ophthalmic solution Place 1 drop into both eyes every evening.   Yes [provider]  Multiple Vitamins-Minerals (RENAPLEX) TABS Take 1 tablet by mouth daily. 07/10/18  Yes [provider]  acetaminophen (TYLENOL) 325 MG tablet Take 2 tablets (650 mg total) by mouth every 4 (four) hours as needed for mild pain (or temp > 37.5 C (99.5 F)). 11/11/17   RDebbe Odea MD  atorvastatin (LIPITOR) 40 MG tablet Take 1 tablet (40 mg total) by mouth daily at 6 PM. Patient not taking: Reported on 09/01/2018 07/19/17   [name] MD  calcitRIOL (ROCALTROL) 0.5 MCG  capsule Take 1 capsule (0.5 mcg total) by mouth Every Tuesday,Thursday,and Saturday with dialysis. Patient not taking: Reported on 11/08/2017 02/21/17   MCristal Ford DO  multivitamin (RENA-VIT) TABS tablet Take 1 tablet by mouth at bedtime. Patient not taking: Reported on 09/01/2018 02/20/17   MCristal Ford DO  polyethylene glycol ([facility]/ GLYCOLAX) packet Take 17 g by mouth daily. Patient taking differently: Take 17 g by mouth daily as needed for mild constipation.  02/20/17   MCristal Ford DO   Liver Function Tests Recent Labs  Lab 09/01/18 1145  AST 16  ALT 11  ALKPHOS 66  BILITOT 0.9  PROT 6.3*  ALBUMIN 3.3*   No results for input(s): LIPASE, AMYLASE in the last 168 hours. CBC Recent Labs  Lab 09/01/18 1145  WBC 4.3  HGB 11.6*  HCT 37.9  MCV 98.4  PLT 174   Basic Metabolic Panel Recent Labs  Lab 09/01/18 1145  NA 133*  K 4.5  CL 93*  CO2 27  GLUCOSE 239*  BUN 51*  CREATININE 6.37*  CALCIUM 9.0   Iron/TIBC/Ferritin/ %Sat    Component Value Date/Time   IRON 30 02/14/2017 1030   TIBC 216 (L) 02/14/2017 1030   FERRITIN 101 02/14/2017 1030   IRONPCTSAT 14 02/14/2017 1030    Vitals:   09/01/18 1400 09/01/18 1500 09/01/18 1530 09/01/18 1545  BP: (!) 170/68 (!) 167/74 (!) 160/69   Pulse: 65 61 64   Resp: 16 15    Temp:      TempSrc:      SpO2: 96% 96%  98%  Weight:      Height:       Exam Gen alert, no distress, calm No rash, cyanosis or gangrene Sclera anicteric, throat clear  No jvd or bruits Chest clear bilat to bases RRR no MRG Abd soft ntnd no mass or ascites +bs obese GU defer MS no joint effusions or deformity Ext no LE or UE edema, no wounds or ulcers Neuro is alert, Ox 3 , nf   Home meds:  - aspirin 81/ atorvastatin 40 qd  - carvedilol 25 bid/ hydralazine 50 tid prn  - calc acetate 1-3 ac tid/ gabapentin 100 hs  - insulin detemir 15 u hs  - eyedrops/ prn's/ vitamins    Last echo April 2019 > LVEF 60-65%,  G1DD  Dialysis: TTS [facility] (started 02/2017)  4h   87kg   400/800  2/2.25 bath  Heparin 300+ 2046mdrun  - mircera 50 ug every 4 wks  - hect 4 ug    Assessment: 1. Rectal bleeding  2. Anemia of ckd +/ abl - Hb 11's 3. ESRD on HD TTS - missed HD today 4. HTN cont meds 5. Vol up 2kg, no gross excess on exam. bp's are up 6. H/o CVA 7. DM on insulin 8. MBD ckd - cont binders 9. CAD sp cath 2015 - mild/mod disease, medical Rx    P: 1. HD tomorrow, UF to dry wt    RKelly SplinterMD CHaverhillpager [phone]  09/01/2018, 4:53 PM

## 2018-09-01 NOTE — ED Notes (Signed)
T, TH, Sat dialysis. Last dialysis Thursday.

## 2018-09-01 NOTE — ED Triage Notes (Signed)
Pt. Stated, for the last few days every time I would wipe it would be bloody. And I feel really horrible.

## 2018-09-02 DIAGNOSIS — Z794 Long term (current) use of insulin: Secondary | ICD-10-CM

## 2018-09-02 DIAGNOSIS — K625 Hemorrhage of anus and rectum: Secondary | ICD-10-CM | POA: Diagnosis not present

## 2018-09-02 DIAGNOSIS — I132 Hypertensive heart and chronic kidney disease with heart failure and with stage 5 chronic kidney disease, or end stage renal disease: Secondary | ICD-10-CM | POA: Diagnosis not present

## 2018-09-02 DIAGNOSIS — E1169 Type 2 diabetes mellitus with other specified complication: Secondary | ICD-10-CM

## 2018-09-02 DIAGNOSIS — N2581 Secondary hyperparathyroidism of renal origin: Secondary | ICD-10-CM | POA: Diagnosis not present

## 2018-09-02 DIAGNOSIS — I10 Essential (primary) hypertension: Secondary | ICD-10-CM

## 2018-09-02 DIAGNOSIS — M170 Bilateral primary osteoarthritis of knee: Secondary | ICD-10-CM | POA: Diagnosis not present

## 2018-09-02 DIAGNOSIS — K5731 Diverticulosis of large intestine without perforation or abscess with bleeding: Secondary | ICD-10-CM | POA: Diagnosis not present

## 2018-09-02 DIAGNOSIS — N186 End stage renal disease: Secondary | ICD-10-CM

## 2018-09-02 DIAGNOSIS — I428 Other cardiomyopathies: Secondary | ICD-10-CM | POA: Diagnosis not present

## 2018-09-02 DIAGNOSIS — K922 Gastrointestinal hemorrhage, unspecified: Secondary | ICD-10-CM

## 2018-09-02 DIAGNOSIS — K921 Melena: Secondary | ICD-10-CM | POA: Diagnosis not present

## 2018-09-02 DIAGNOSIS — Z992 Dependence on renal dialysis: Secondary | ICD-10-CM | POA: Diagnosis not present

## 2018-09-02 DIAGNOSIS — Z8673 Personal history of transient ischemic attack (TIA), and cerebral infarction without residual deficits: Secondary | ICD-10-CM | POA: Diagnosis not present

## 2018-09-02 DIAGNOSIS — R079 Chest pain, unspecified: Secondary | ICD-10-CM | POA: Diagnosis not present

## 2018-09-02 DIAGNOSIS — I2510 Atherosclerotic heart disease of native coronary artery without angina pectoris: Secondary | ICD-10-CM | POA: Diagnosis not present

## 2018-09-02 DIAGNOSIS — E785 Hyperlipidemia, unspecified: Secondary | ICD-10-CM | POA: Diagnosis not present

## 2018-09-02 DIAGNOSIS — I5022 Chronic systolic (congestive) heart failure: Secondary | ICD-10-CM | POA: Diagnosis not present

## 2018-09-02 DIAGNOSIS — D500 Iron deficiency anemia secondary to blood loss (chronic): Secondary | ICD-10-CM | POA: Diagnosis not present

## 2018-09-02 LAB — COMPREHENSIVE METABOLIC PANEL
ALT: 11 U/L (ref 0–44)
AST: 22 U/L (ref 15–41)
Albumin: 3.1 g/dL — ABNORMAL LOW (ref 3.5–5.0)
Alkaline Phosphatase: 57 U/L (ref 38–126)
Anion gap: 14 (ref 5–15)
BUN: 55 mg/dL — AB (ref 8–23)
CO2: 26 mmol/L (ref 22–32)
Calcium: 9 mg/dL (ref 8.9–10.3)
Chloride: 93 mmol/L — ABNORMAL LOW (ref 98–111)
Creatinine, Ser: 7.04 mg/dL — ABNORMAL HIGH (ref 0.44–1.00)
GFR calc Af Amer: 6 mL/min — ABNORMAL LOW (ref >60)
GFR calc non Af Amer: 5 mL/min — ABNORMAL LOW (ref >60)
Glucose, Bld: 91 mg/dL (ref 70–99)
Potassium: 4.6 mmol/L (ref 3.5–5.1)
Sodium: 133 mmol/L — ABNORMAL LOW (ref 135–145)
Total Bilirubin: 1.1 mg/dL (ref 0.3–1.2)
Total Protein: 5.9 g/dL — ABNORMAL LOW (ref 6.5–8.1)

## 2018-09-02 LAB — CBC
HCT: 35.6 % — ABNORMAL LOW (ref 36.0–46.0)
Hemoglobin: 11.3 g/dL — ABNORMAL LOW (ref 12.0–15.0)
MCH: 30.7 pg (ref 26.0–34.0)
MCHC: 31.7 g/dL (ref 30.0–36.0)
MCV: 96.7 fL (ref 80.0–100.0)
NRBC: 0 % (ref 0.0–0.2)
Platelets: 199 10*3/uL (ref 150–400)
RBC: 3.68 MIL/uL — ABNORMAL LOW (ref 3.87–5.11)
RDW: 13.7 % (ref 11.5–15.5)
WBC: 4.4 10*3/uL (ref 4.0–10.5)

## 2018-09-02 LAB — GLUCOSE, CAPILLARY
Glucose-Capillary: 59 mg/dL — ABNORMAL LOW (ref 70–99)
Glucose-Capillary: 89 mg/dL (ref 70–99)
Glucose-Capillary: 181 mg/dL — ABNORMAL HIGH (ref 70–99)
Glucose-Capillary: 187 mg/dL — ABNORMAL HIGH (ref 70–99)
Glucose-Capillary: 129 mg/dL — ABNORMAL HIGH (ref 70–99)
Glucose-Capillary: 96 mg/dL (ref 70–99)

## 2018-09-02 LAB — TROPONIN I: Troponin I: <0.03 ng/mL (ref <0.03)

## 2018-09-02 MED ORDER — INSULIN DETEMIR 100 UNIT/ML ~~LOC~~ SOLN
10.0000 [IU] | Freq: Every day | SUBCUTANEOUS | Status: DC
  Administered 2018-09-02 – 2018-09-03 (×2): 10 [IU] via SUBCUTANEOUS
  Filled 2018-09-02 (×3): qty 0.1

## 2018-09-02 MED ORDER — GLUCOSE 40 % PO GEL: 1.0000 | Freq: Once | ORAL | Status: DC

## 2018-09-02 MED ORDER — GLUCOSE 40 % PO GEL
ORAL | Status: AC
  Administered 2018-09-02: 37.5 g
  Filled 2018-09-02: qty 1

## 2018-09-02 MED ORDER — ALUM & MAG HYDROXIDE-SIMETH 200-200-20 MG/5ML PO SUSP: 30.0000 mL | ORAL | Status: DC | PRN

## 2018-09-02 NOTE — Progress Notes (Signed)
HD tx ended 40 min early @ 2225 d/t tx stopped b/c dialyzer was about to clot off and pt did not want me to reset up/restart/continue tx. Dr. Jonnie Finner made aware @ 2252 UF goal not met Blood rinsed back VSS Report called to Nellie, RN

## 2018-09-02 NOTE — Progress Notes (Signed)
Pt cbg this AM was 58, she was given oral hydration and glutose gel, rechecked 15 minutes, cbg was 89\ MD notified for possible ivf or other interventions if needed

## 2018-09-02 NOTE — Consult Note (Signed)
CONSULT FOR [facility]  Reason for Consult: Hematochezia Referring Physician: [facility]  Kathleen [name] HPI:  This is a 72 year old female with a PMH of ESRD on dialysis, DM, CAD, hyperlipidemia, and HTN admitted with a lower GI bleed.  She reports having bloody bowel movements over the past 6 days and it progressively worsened.  She was admitted 12/2015 with a presumed lower GI bleed.  Work up at that time with a CT scan showed extensive diverticulosis.  Her HGB remained stable in the 8-9 range during that admission x 4 days, and she was discharged home.  No transfusion was provided for the patient.  She recalls having a colonoscopy 15 years ago with [facility].  Her HGB during this admission was at 11.3 g/dL, which is above her baseline of 9-10.  She states that this current bleeding is very similar to her prior bleeding in 2017.  Past Medical History:  Diagnosis Date  . Anemia    a. 01/2014: suspected of chronic disease, negative FOBT.  [name]    knees  . Asthma    many years ago  . CAD (coronary artery disease)    a. Cath 01/2014: mild in LAD, mild-mod LCx, mod-severe RCA - for med rx.  . Diabetes mellitus without complication ([place])   . ESRD (end stage renal disease) ([place])    Cool  . History of echocardiogram    Echo (10/15):  Mod LVH, EF 50-55%, Gr 1 DD, MAC, mild MR, mild TR, PASP 35 mmHg  . Hx of transfusion   . Hyperlipidemia   . Hypertension   . Meningioma ([place])    a. Incidental dx 01/2014.  . Obesity   . Renal insufficiency   . Systolic CHF ([place])    a. 09/4578: dx with mixed ICM/NICM (out of proportion to CAD) EF 25-30% by echo.     Past Surgical History:  Procedure Laterality Date  . ABDOMINAL HYSTERECTOMY    . AV FISTULA PLACEMENT Left 02/10/2017   Procedure: ARTERIOVENOUS (AV) FISTULA CREATION-LEFT FOREARM;  Surgeon: Elam Dutch, MD;  Location: [place];  Service: Vascular;  Laterality: Left;  . BREAST SURGERY Right    Lumpectomy  .  COLONOSCOPY    . FISTULA SUPERFICIALIZATION Left 08/09/2017   Procedure: FISTULA SUPERFICIALIZATION LEFT ARM ARTERIOVENOUS FISTULA;  Surgeon: Conrad Wilder, MD;  Location: [place];  Service: Vascular;  Laterality: Left;  . INSERTION OF DIALYSIS CATHETER Right 02/10/2017   Procedure: INSERTION OF DIALYSIS CATHETER;  Surgeon: Elam Dutch, MD;  Location: [place];  Service: Vascular;  Laterality: Right;  . LEFT HEART CATHETERIZATION WITH CORONARY ANGIOGRAM N/A 01/27/2014   Procedure: LEFT HEART CATHETERIZATION WITH CORONARY ANGIOGRAM;  Surgeon: Jettie Booze, MD;  [facility] C[facility];  Service: Cardiovascular;  Laterality: N/A;  . TONSILLECTOMY      Family History  Problem Relation Age of Onset  . Diabetic kidney disease Mother   . Hypertension Mother   . Heart failure Mother   . Heart attack Mother   . Hyperlipidemia Mother   . Hypertension Father     Social History:  reports that she has quit smoking. Her smoking use included cigarettes. She has a 7.50 pack-year smoking history. She has never used smokeless tobacco. She reports that she does not drink alcohol or use drugs.  Allergies:  Allergies  Allergen Reactions  . Baclofen     Severe delirium when given to pateint in Dec 2018.  Medications:  Scheduled: . brimonidine  1 drop Both Eyes Q12H   And  . timolol  1 drop Both Eyes Q12H  . calcium acetate  2,001 mg Oral TID WC  . carvedilol  25 mg Oral BID  . Chlorhexidine Gluconate Cloth  6 each Topical Q0600  . dextrose  1 Tube Oral Once  . gabapentin  100 mg Oral QHS  . insulin aspart  0-9 Units Subcutaneous TID WC  . insulin detemir  10 Units Subcutaneous QHS  . latanoprost  1 drop Both Eyes QPM  . sodium chloride flush  3 mL Intravenous Q12H   Continuous: . sodium chloride      Results for orders placed or performed during the hospital encounter of 09/01/18 (from the past 24 hour(s))  Type and screen [place]     Status: None   Collection  Time: 09/01/18 11:40 AM  Result Value Ref Range   ABO/RH(D) O POS    Antibody Screen NEG    Sample Expiration      09/04/2018 Performed at [facility], [place] [address] [place], [place] [address]   Comprehensive metabolic panel     Status: Abnormal   Collection Time: 09/01/18 11:45 AM  Result Value Ref Range   Sodium 133 (L) 135 - 145 mmol/L   Potassium 4.5 3.5 - 5.1 mmol/L   Chloride 93 (L) 98 - 111 mmol/L   CO2 27 22 - 32 mmol/L   Glucose, Bld 239 (H) 70 - 99 mg/dL   BUN 51 (H) 8 - 23 mg/dL   Creatinine, Ser 6.37 (H) 0.44 - 1.00 mg/dL   Calcium 9.0 8.9 - 10.3 mg/dL   Total Protein 6.3 (L) 6.5 - 8.1 g/dL   Albumin 3.3 (L) 3.5 - 5.0 g/dL   AST 16 15 - 41 U/L   ALT 11 0 - 44 U/L   Alkaline Phosphatase 66 38 - 126 U/L   Total Bilirubin 0.9 0.3 - 1.2 mg/dL   GFR calc non Af Amer 6 (L) >60 mL/min   GFR calc Af Amer 7 (L) >60 mL/min   Anion gap 13 5 - 15  CBC     Status: Abnormal   Collection Time: 09/01/18 11:45 AM  Result Value Ref Range   WBC 4.3 4.0 - 10.5 K/uL   RBC 3.85 (L) 3.87 - 5.11 MIL/uL   Hemoglobin 11.6 (L) 12.0 - 15.0 g/dL   HCT 37.9 36.0 - 46.0 %   MCV 98.4 80.0 - 100.0 fL   MCH 30.1 26.0 - 34.0 pg   MCHC 30.6 30.0 - 36.0 g/dL   RDW 13.7 11.5 - 15.5 %   Platelets 168 150 - 400 K/uL   nRBC 0.0 0.0 - 0.2 %  Hemoglobin A1c     Status: Abnormal   Collection Time: 09/01/18 11:45 AM  Result Value Ref Range   Hgb A1c MFr Bld 9.1 (H) 4.8 - 5.6 %   Mean Plasma Glucose 214.47 mg/dL  POC occult blood, ED     Status: Abnormal   Collection Time: 09/01/18 12:13 PM  Result Value Ref Range   Fecal Occult Bld POSITIVE (A) NEGATIVE  Glucose, capillary     Status: Abnormal   Collection Time: 09/01/18  5:23 PM  Result Value Ref Range   Glucose-Capillary 174 (H) 70 - 99 mg/dL  Glucose, capillary     Status: Abnormal   Collection Time: 09/01/18  9:16 PM  Result Value Ref Range   Glucose-Capillary  218 (H) 70 - 99 mg/dL  CBC     Status: Abnormal   Collection Time:  09/02/18  4:10 AM  Result Value Ref Range   WBC 4.4 4.0 - 10.5 K/uL   RBC 3.68 (L) 3.87 - 5.11 MIL/uL   Hemoglobin 11.3 (L) 12.0 - 15.0 g/dL   HCT 35.6 (L) 36.0 - 46.0 %   MCV 96.7 80.0 - 100.0 fL   MCH 30.7 26.0 - 34.0 pg   MCHC 31.7 30.0 - 36.0 g/dL   RDW 13.7 11.5 - 15.5 %   Platelets 199 150 - 400 K/uL   nRBC 0.0 0.0 - 0.2 %  Comprehensive metabolic panel     Status: Abnormal   Collection Time: 09/02/18  4:10 AM  Result Value Ref Range   Sodium 133 (L) 135 - 145 mmol/L   Potassium 4.6 3.5 - 5.1 mmol/L   Chloride 93 (L) 98 - 111 mmol/L   CO2 26 22 - 32 mmol/L   Glucose, Bld 91 70 - 99 mg/dL   BUN 55 (H) 8 - 23 mg/dL   Creatinine, Ser 7.04 (H) 0.44 - 1.00 mg/dL   Calcium 9.0 8.9 - 10.3 mg/dL   Total Protein 5.9 (L) 6.5 - 8.1 g/dL   Albumin 3.1 (L) 3.5 - 5.0 g/dL   AST 22 15 - 41 U/L   ALT 11 0 - 44 U/L   Alkaline Phosphatase 57 38 - 126 U/L   Total Bilirubin 1.1 0.3 - 1.2 mg/dL   GFR calc non Af Amer 5 (L) >60 mL/min   GFR calc Af Amer 6 (L) >60 mL/min   Anion gap 14 5 - 15  Glucose, capillary     Status: Abnormal   Collection Time: 09/02/18  7:40 AM  Result Value Ref Range   Glucose-Capillary 59 (L) 70 - 99 mg/dL  Glucose, capillary     Status: None   Collection Time: 09/02/18  8:06 AM  Result Value Ref Range   Glucose-Capillary 89 70 - 99 mg/dL     No results found.  ROS:  As stated above in the HPI otherwise negative.  Blood pressure (!) 131/55, pulse 64, temperature 98.1 F (36.7 C), temperature source Oral, resp. rate 16, height _0  (1.6 m), weight 88.8 kg, SpO2 94 %.    PE: Gen: NAD, Alert and Oriented HEENT:  [place]/AT, EOMI Neck: Supple, no LAD Lungs: CTA Bilaterally CV: RRR without M/G/R ABM: Soft, NTND, +BS Ext: No C/C/E  Assessment/Plan: 1) Hematochezia. 2) Chronic anemia. 3) ESRD. 4) CAD. 5) Diverticulosis.   The patient should undergo another colonoscopy given the long length of time since the last procedure and her recurrent bleeding.  It  is not clear if this is a true diverticular bleed, but she is stable from the GI standpoint.  Just before the evaluation she complained about chest pain that resolved in one minute, but Dr. Cruzita Lederer is going to rule out an MI.  Plan: 1) Colonoscopy provided she does not have an active cardiac issue.   Elka Satterfield D 09/02/2018, 11:08 AM

## 2018-09-02 NOTE — Progress Notes (Signed)
PROGRESS NOTE  Kathleen [name] JGG:[id number] DOB: 1945/10/17 DOA: 09/01/2018 PCP: Benito Mccreedy, MD   LOS: 0 days   Brief Narrative / Interim history: 73 year old female with history of end-stage renal disease on HD TTS, type 2 diabetes mellitus, coronary artery disease, hypertension, hyperlipidemia who presented to the hospital with about 6 days of bright red blood per rectum.  She initially noticed a small amounts of blood on the toilet paper, however over the last few days this is progressively increased in quantity, and she became quite concerned and came to the emergency room.  She was hospitalized in 2017 with lower GI bleed, at that time was thought to be clear bleed.  She tells me that her bleed was much severe last time came on all of a sudden and went away suddenly as well.  She feels like this is different.  She had a colonoscopy about 15 years ago (per patient, thinks [facility] did it), but has not seen a gastroenterologist since  Subjective: No complaints this morning, denies any abdominal pain, no nausea or vomiting.  Had no bowel movements yet.  Denies any chest pain, shortness of breath  Assessment & Plan: Active Problems:   Rectal bleeding   Principal Problem Rectal bleeding -She has a history of diverticulosis however clinical picture does not really suggest diverticular bleed.  She has not had a GI evaluation in several years and this is her second episode of lower bleed.  I have consulted GI, discussed with Dr. Benson Norway, appreciate input -Her hemoglobin is stable, she does not need to be transfused, continue to closely monitor  Active Problems Anemia, multifactorial due to end-stage renal disease as well as GI bleed -Hemoglobin stable, will follow CBC and transfuse as needed for hemoglobin less than 7  End-stage renal disease on HD -Nephrology consulted and following patient.  Type 2 diabetes mellitus -She is hypoglycemic this morning, will decrease Levemir dose  from 15 to 10 units. -Continue clear liquid diet until evaluated by GI  Hypertension -Continue Coreg, blood pressure stable  Scheduled Meds: . brimonidine  1 drop Both Eyes Q12H   And  . timolol  1 drop Both Eyes Q12H  . calcium acetate  2,001 mg Oral TID WC  . carvedilol  25 mg Oral BID  . Chlorhexidine Gluconate Cloth  6 each Topical Q0600  . dextrose  1 Tube Oral Once  . gabapentin  100 mg Oral QHS  . insulin aspart  0-9 Units Subcutaneous TID WC  . insulin detemir  15 Units Subcutaneous QHS  . latanoprost  1 drop Both Eyes QPM  . sodium chloride flush  3 mL Intravenous Q12H   Continuous Infusions: . sodium chloride     PRN Meds:.sodium chloride, acetaminophen, hydrALAZINE, ondansetron **OR** ondansetron (ZOFRAN) IV, sodium chloride flush  DVT prophylaxis: SCDs Code Status: Full code Family Communication: No family present at bedside Disposition Plan: Home when ready  Consultants:   Gastroenterology  Nephrology  Procedures:   None  Antimicrobials:  None  Objective: Vitals:   09/01/18 1545 09/01/18 1713 09/01/18 2119 09/02/18 0419  BP:  (!) 189/72 (!) 161/63 (!) 131/55  Pulse:  67 65 64  Resp:  16 17 16   Temp:  98 F (36.7 C) 97.9 F (36.6 C) 98.1 F (36.7 C)  TempSrc:  Oral Oral Oral  SpO2: 98% 97% 96% 94%  Weight:  88.8 kg    Height:  5\' 3"  (1.6 m)      Intake/Output Summary (Last 24  hours) at 09/02/2018 0949 Last data filed at 09/02/2018 0400 Gross per 24 hour  Intake 240 ml  Output -  Net 240 ml   Filed Weights   09/01/18 1119 09/01/18 1713  Weight: 83.9 kg 88.8 kg    Examination:  Constitutional: NAD Eyes: PERRL, lids and conjunctivae normal ENMT: Mucous membranes are moist.  Neck: normal, supple Respiratory: clear to auscultation bilaterally, no wheezing, no crackles. Normal respiratory effort. No accessory muscle use.  Cardiovascular: Regular rate and rhythm, no murmurs / rubs / gallops. No LE edema. 2+ pedal pulses. No carotid  bruits.  Abdomen: no tenderness. Bowel sounds positive.  Musculoskeletal: no clubbing / cyanosis.  Skin: no rashes Neurologic: CN 2-12 grossly intact. Strength 5/5 in all 4.  Psychiatric: Normal judgment and insight. Alert and oriented x 3. Normal mood.    Data Reviewed: I have independently reviewed following labs and imaging studies   CBC: Recent Labs  Lab 09/01/18 1145 09/02/18 0410  WBC 4.3 4.4  HGB 11.6* 11.3*  HCT 37.9 35.6*  MCV 98.4 96.7  PLT 168 454   Basic Metabolic Panel: Recent Labs  Lab 09/01/18 1145 09/02/18 0410  NA 133* 133*  K 4.5 4.6  CL 93* 93*  CO2 27 26  GLUCOSE 239* 91  BUN 51* 55*  CREATININE 6.37* 7.04*  CALCIUM 9.0 9.0   GFR: Estimated Creatinine Clearance: 7.5 mL/min (A) (by C-G formula based on SCr of 7.04 mg/dL (H)). Liver Function Tests: Recent Labs  Lab 09/01/18 1145 09/02/18 0410  AST 16 22  ALT 11 11  ALKPHOS 66 57  BILITOT 0.9 1.1  PROT 6.3* 5.9*  ALBUMIN 3.3* 3.1*   No results for input(s): LIPASE, AMYLASE in the last 168 hours. No results for input(s): AMMONIA in the last 168 hours. Coagulation Profile: No results for input(s): INR, PROTIME in the last 168 hours. Cardiac Enzymes: No results for input(s): CKTOTAL, CKMB, CKMBINDEX, TROPONINI in the last 168 hours. BNP (last 3 results) No results for input(s): PROBNP in the last 8760 hours. HbA1C: Recent Labs    09/01/18 1145  HGBA1C 9.1*   CBG: Recent Labs  Lab 09/01/18 1723 09/01/18 2116 09/02/18 0740 09/02/18 0806  GLUCAP 174* 218* 59* 89   Lipid Profile: No results for input(s): CHOL, HDL, LDLCALC, TRIG, CHOLHDL, LDLDIRECT in the last 72 hours. Thyroid Function Tests: No results for input(s): TSH, T4TOTAL, FREET4, T3FREE, THYROIDAB in the last 72 hours. Anemia Panel: No results for input(s): VITAMINB12, FOLATE, FERRITIN, TIBC, IRON, RETICCTPCT in the last 72 hours. Urine analysis:    Component Value Date/Time   COLORURINE YELLOW 07/13/2017 2335    APPEARANCEUR CLEAR 07/13/2017 2335   LABSPEC 1.009 07/13/2017 2335   PHURINE 6.0 07/13/2017 2335   GLUCOSEU NEGATIVE 07/13/2017 2335   HGBUR NEGATIVE 07/13/2017 2335   BILIRUBINUR NEGATIVE 07/13/2017 2335   KETONESUR NEGATIVE 07/13/2017 2335   PROTEINUR 100 (A) 07/13/2017 2335   UROBILINOGEN 0.2 06/11/2014 0639   NITRITE NEGATIVE 07/13/2017 2335   LEUKOCYTESUR NEGATIVE 07/13/2017 2335   Sepsis Labs: Invalid input(s): PROCALCITONIN, LACTICIDVEN  No results found for this or any previous visit (from the past 240 hour(s)).    Radiology Studies: No results found.   Marzetta Board, MD, PhD [facility]  Contact via  [email or web address]  [address] P: [phone]  F: [phone]

## 2018-09-02 NOTE — Progress Notes (Signed)
HD tx initiated via 15Gx2 w/o problem Pull/push/flush well w/o problem VSS Will continue to monitor while on HD tx 

## 2018-09-02 NOTE — Progress Notes (Addendum)
Pt back to floor, vital signs within normal limits. Blood sugar at  96 . Bedtime snack given. Weight 89.8 kg. Pt denies dizziness, nor pain nor nausea. Will monitor pt.

## 2018-09-02 NOTE — Progress Notes (Addendum)
Subjective:  No cos , tolerating liquid brk.   Objective Vital signs in last 24 hours: Vitals:   09/01/18 1545 09/01/18 1713 09/01/18 2119 09/02/18 0419  BP:  (!) 189/72 (!) 161/63 (!) 131/55  Pulse:  67 65 64  Resp:  16 17 16   Temp:  98 F (36.7 C) 97.9 F (36.6 C) 98.1 F (36.7 C)  TempSrc:  Oral Oral Oral  SpO2: 98% 97% 96% 94%  Weight:  88.8 kg    Height:  5\' 3"  (1.6 m)     Weight change:   Physical Exam: General: alert ,calm elderly female NAD  Heart: RRR, no mrg Lungs: CTA Abdomen: BS +, soft , Nt, ND  Extremities:  no pedal edema   Dialysis Access : pos bruit LFA AVF   OP Dialysis: [facility] (started 02/2017)  4h   87kg   400/800  2/2.25 bath  Heparin 3000 load + 2021midrun  - mircera 50 ug every 4 wks ( last given 08/14/08)  - hect 4 ug  Problem/Plan: 1. Rectal bleed - per gi / admit , NO HEP on HD  , HGB 11.6 admit . 11.3 this am  2. ESRD - HD TTS ,missed yest hd today planned this evening 3. Anemia  Of ESRD /ABL= no esa neds for now 4. HTN/volume - Vol ok , 2 kg per wts ,bp better this am/ hd today 5. Secondary hyperparathyroidism - vit d and binders  6. HO CVA  7. HO CAD cath 2015 - med rx   Ernest Haber, PA-C [place] [phone] 09/02/2018,9:12 AM  LOS: 0 days   Pt seen, examined and agree w A/P as above.  [name] MD [employer] pager [phone]   09/02/2018, 1:22 PM    Labs: Basic Metabolic Panel: Recent Labs  Lab 09/01/18 1145 09/02/18 0410  NA 133* 133*  K 4.5 4.6  CL 93* 93*  CO2 27 26  GLUCOSE 239* 91  BUN 51* 55*  CREATININE 6.37* 7.04*  CALCIUM 9.0 9.0   Liver Function Tests: Recent Labs  Lab 09/01/18 1145 09/02/18 0410  AST 16 22  ALT 11 11  ALKPHOS 66 57  BILITOT 0.9 1.1  PROT 6.3* 5.9*  ALBUMIN 3.3* 3.1*   No results for input(s): LIPASE, AMYLASE in the last 168 hours. No results for input(s): AMMONIA in the last 168 hours. CBC: Recent Labs  Lab 09/01/18 1145  09/02/18 0410  WBC 4.3 4.4  HGB 11.6* 11.3*  HCT 37.9 35.6*  MCV 98.4 96.7  PLT 168 199   Cardiac Enzymes: No results for input(s): CKTOTAL, CKMB, CKMBINDEX, TROPONINI in the last 168 hours. CBG: Recent Labs  Lab 09/01/18 1723 09/01/18 2116 09/02/18 0740 09/02/18 0806  GLUCAP 174* 218* 59* 89    Studies/Results: No results found. Medications: . sodium chloride     . brimonidine  1 drop Both Eyes Q12H   And  . timolol  1 drop Both Eyes Q12H  . calcium acetate  2,001 mg Oral TID WC  . carvedilol  25 mg Oral BID  . Chlorhexidine Gluconate Cloth  6 each Topical Q0600  . dextrose  1 Tube Oral Once  . gabapentin  100 mg Oral QHS  . insulin aspart  0-9 Units Subcutaneous TID WC  . insulin detemir  15 Units Subcutaneous QHS  . latanoprost  1 drop Both Eyes QPM  . sodium chloride flush  3 mL Intravenous Q12H

## 2018-09-03 ENCOUNTER — Encounter (HOSPITAL_COMMUNITY)
Admission: EM | Disposition: A | Discharge status: Home / Self Care | Payer: Self-pay | Source: Home / Self Care | Attending: Internal Medicine

## 2018-09-03 DIAGNOSIS — Z7982 Long term (current) use of aspirin: Secondary | ICD-10-CM | POA: Diagnosis not present

## 2018-09-03 DIAGNOSIS — I10 Essential (primary) hypertension: Secondary | ICD-10-CM | POA: Diagnosis not present

## 2018-09-03 DIAGNOSIS — I2510 Atherosclerotic heart disease of native coronary artery without angina pectoris: Secondary | ICD-10-CM | POA: Diagnosis present

## 2018-09-03 DIAGNOSIS — I132 Hypertensive heart and chronic kidney disease with heart failure and with stage 5 chronic kidney disease, or end stage renal disease: Secondary | ICD-10-CM | POA: Diagnosis present

## 2018-09-03 DIAGNOSIS — K648 Other hemorrhoids: Secondary | ICD-10-CM | POA: Diagnosis present

## 2018-09-03 DIAGNOSIS — N186 End stage renal disease: Secondary | ICD-10-CM | POA: Diagnosis present

## 2018-09-03 DIAGNOSIS — Z992 Dependence on renal dialysis: Secondary | ICD-10-CM | POA: Diagnosis not present

## 2018-09-03 DIAGNOSIS — D631 Anemia in chronic kidney disease: Secondary | ICD-10-CM | POA: Diagnosis not present

## 2018-09-03 DIAGNOSIS — I120 Hypertensive chronic kidney disease with stage 5 chronic kidney disease or end stage renal disease: Secondary | ICD-10-CM | POA: Diagnosis not present

## 2018-09-03 DIAGNOSIS — D125 Benign neoplasm of sigmoid colon: Secondary | ICD-10-CM | POA: Diagnosis present

## 2018-09-03 DIAGNOSIS — E1122 Type 2 diabetes mellitus with diabetic chronic kidney disease: Secondary | ICD-10-CM | POA: Diagnosis present

## 2018-09-03 DIAGNOSIS — Z90710 Acquired absence of both cervix and uterus: Secondary | ICD-10-CM | POA: Diagnosis not present

## 2018-09-03 DIAGNOSIS — K259 Gastric ulcer, unspecified as acute or chronic, without hemorrhage or perforation: Secondary | ICD-10-CM | POA: Diagnosis not present

## 2018-09-03 DIAGNOSIS — Z8349 Family history of other endocrine, nutritional and metabolic diseases: Secondary | ICD-10-CM | POA: Diagnosis not present

## 2018-09-03 DIAGNOSIS — Z87891 Personal history of nicotine dependence: Secondary | ICD-10-CM | POA: Diagnosis not present

## 2018-09-03 DIAGNOSIS — Z841 Family history of disorders of kidney and ureter: Secondary | ICD-10-CM | POA: Diagnosis not present

## 2018-09-03 DIAGNOSIS — D120 Benign neoplasm of cecum: Secondary | ICD-10-CM | POA: Diagnosis present

## 2018-09-03 DIAGNOSIS — K2970 Gastritis, unspecified, without bleeding: Secondary | ICD-10-CM | POA: Diagnosis not present

## 2018-09-03 DIAGNOSIS — I5022 Chronic systolic (congestive) heart failure: Secondary | ICD-10-CM | POA: Diagnosis present

## 2018-09-03 DIAGNOSIS — D122 Benign neoplasm of ascending colon: Secondary | ICD-10-CM | POA: Diagnosis present

## 2018-09-03 DIAGNOSIS — K922 Gastrointestinal hemorrhage, unspecified: Secondary | ICD-10-CM | POA: Diagnosis not present

## 2018-09-03 DIAGNOSIS — N2581 Secondary hyperparathyroidism of renal origin: Secondary | ICD-10-CM | POA: Diagnosis present

## 2018-09-03 DIAGNOSIS — R079 Chest pain, unspecified: Secondary | ICD-10-CM | POA: Diagnosis not present

## 2018-09-03 DIAGNOSIS — Z794 Long term (current) use of insulin: Secondary | ICD-10-CM | POA: Diagnosis not present

## 2018-09-03 DIAGNOSIS — K625 Hemorrhage of anus and rectum: Secondary | ICD-10-CM | POA: Diagnosis not present

## 2018-09-03 DIAGNOSIS — M170 Bilateral primary osteoarthritis of knee: Secondary | ICD-10-CM | POA: Diagnosis present

## 2018-09-03 DIAGNOSIS — Z8249 Family history of ischemic heart disease and other diseases of the circulatory system: Secondary | ICD-10-CM | POA: Diagnosis not present

## 2018-09-03 DIAGNOSIS — K2990 Gastroduodenitis, unspecified, without bleeding: Secondary | ICD-10-CM | POA: Diagnosis not present

## 2018-09-03 DIAGNOSIS — E785 Hyperlipidemia, unspecified: Secondary | ICD-10-CM | POA: Diagnosis present

## 2018-09-03 DIAGNOSIS — K921 Melena: Secondary | ICD-10-CM | POA: Diagnosis present

## 2018-09-03 DIAGNOSIS — I428 Other cardiomyopathies: Secondary | ICD-10-CM | POA: Diagnosis present

## 2018-09-03 DIAGNOSIS — Z8673 Personal history of transient ischemic attack (TIA), and cerebral infarction without residual deficits: Secondary | ICD-10-CM | POA: Diagnosis not present

## 2018-09-03 DIAGNOSIS — K5731 Diverticulosis of large intestine without perforation or abscess with bleeding: Secondary | ICD-10-CM | POA: Diagnosis present

## 2018-09-03 LAB — CBC
HEMATOCRIT: 43.1 % (ref 36.0–46.0)
Hemoglobin: 13.5 g/dL (ref 12.0–15.0)
MCH: 30.4 pg (ref 26.0–34.0)
MCHC: 31.3 g/dL (ref 30.0–36.0)
MCV: 97.1 fL (ref 80.0–100.0)
Platelets: 179 10*3/uL (ref 150–400)
RBC: 4.44 MIL/uL (ref 3.87–5.11)
RDW: 13.5 % (ref 11.5–15.5)
WBC: 3.9 10*3/uL — ABNORMAL LOW (ref 4.0–10.5)
nRBC: 0 % (ref 0.0–0.2)

## 2018-09-03 LAB — TROPONIN I: Troponin I: <0.03 ng/mL (ref <0.03)

## 2018-09-03 LAB — GLUCOSE, CAPILLARY
GLUCOSE-CAPILLARY: 123 mg/dL — AB (ref 70–99)
Glucose-Capillary: 114 mg/dL — ABNORMAL HIGH (ref 70–99)
Glucose-Capillary: 151 mg/dL — ABNORMAL HIGH (ref 70–99)
Glucose-Capillary: 75 mg/dL (ref 70–99)
Glucose-Capillary: 135 mg/dL — ABNORMAL HIGH (ref 70–99)

## 2018-09-03 LAB — BASIC METABOLIC PANEL
Anion gap: 15 (ref 5–15)
BUN: 20 mg/dL (ref 8–23)
CO2: 27 mmol/L (ref 22–32)
Calcium: 9 mg/dL (ref 8.9–10.3)
Chloride: 92 mmol/L — ABNORMAL LOW (ref 98–111)
Creatinine, Ser: 3.34 mg/dL — ABNORMAL HIGH (ref 0.44–1.00)
GFR calc non Af Amer: 13 mL/min — ABNORMAL LOW (ref >60)
GFR, EST AFRICAN AMERICAN: 15 mL/min — AB (ref >60)
Glucose, Bld: 100 mg/dL — ABNORMAL HIGH (ref 70–99)
Potassium: 3.4 mmol/L — ABNORMAL LOW (ref 3.5–5.1)
Sodium: 134 mmol/L — ABNORMAL LOW (ref 135–145)

## 2018-09-03 LAB — HEPATITIS B SURFACE ANTIGEN: Hepatitis B Surface Ag: NEGATIVE

## 2018-09-03 SURGERY — COLONOSCOPY WITH PROPOFOL
Anesthesia: Monitor Anesthesia Care

## 2018-09-03 MED ORDER — DOXERCALCIFEROL 4 MCG/2ML IV SOLN
4.0000 ug | INTRAVENOUS | Status: DC
  Filled 2018-09-03: qty 2

## 2018-09-03 MED ORDER — PEG-KCL-NACL-NASULF-NA ASC-C 100 G PO SOLR: 1.0000 | Freq: Once | ORAL | Status: DC

## 2018-09-03 MED ORDER — PEG 3350-KCL-NA BICARB-NACL 420 G PO SOLR
4000.0000 mL | Freq: Once | ORAL | Status: DC
  Filled 2018-09-03: qty 4000

## 2018-09-03 MED ORDER — PEG-KCL-NACL-NASULF-NA ASC-C 100 G PO SOLR
0.5000 | Freq: Once | ORAL | Status: AC
  Administered 2018-09-03: 100 g via ORAL
  Filled 2018-09-03: qty 1

## 2018-09-03 MED ORDER — METOCLOPRAMIDE HCL 5 MG/ML IJ SOLN
10.0000 mg | Freq: Once | INTRAMUSCULAR | Status: AC
  Administered 2018-09-03: 10 mg via INTRAVENOUS
  Filled 2018-09-03: qty 2

## 2018-09-03 MED ORDER — BISACODYL 5 MG PO TBEC
20.0000 mg | DELAYED_RELEASE_TABLET | Freq: Once | ORAL | Status: AC
  Administered 2018-09-03: 20 mg via ORAL
  Filled 2018-09-03: qty 4

## 2018-09-03 MED ORDER — RENA-VITE PO TABS
1.0000 | ORAL_TABLET | Freq: Every day | ORAL | Status: DC
  Administered 2018-09-03: 1 via ORAL
  Filled 2018-09-03: qty 1

## 2018-09-03 MED ORDER — CHLORHEXIDINE GLUCONATE CLOTH 2 % EX PADS
6.0000 | MEDICATED_PAD | Freq: Every day | CUTANEOUS | Status: DC
  Administered 2018-09-03 – 2018-09-04 (×2): 6 via TOPICAL

## 2018-09-03 NOTE — Progress Notes (Signed)
          Daily Rounding Note  09/03/2018, 10:12 AM  LOS: 0 days   SUBJECTIVE:   Chief complaint: hematochezia for several days.   Last BM was yesterday AM, still bloody then.  Intermittent nausea but no abd pain. Colonoscopy prep and procedure cancelled due to MD wanting to r/o MI given (resolved) CP.  Also returned to floor from HD at midnight, never started prep.    Hx presumed diverticular bleed in 2017.       OBJECTIVE:         Vital signs in last 24 hours:    Temp:  [97.8 F (36.6 C)-98.2 F (36.8 C)] 97.8 F (36.6 C) (01/27 0516) Pulse Rate:  [59-69] 67 (01/27 0516) Resp:  [14-26] 18 (01/27 0516) BP: (103-211)/(51-100) 160/61 (01/27 0516) SpO2:  [93 %-99 %] 93 % (01/27 0516) Weight:  [89 kg-89.7 kg] 89 kg (01/26 2244) Last BM Date: 09/02/18 Filed Weights   09/01/18 1713 09/02/18 1847 09/02/18 2244  Weight: 88.8 kg 89.7 kg 89 kg   General: pleasant, comfortable, not ill looking   Heart: RRR Chest: clear bil.  No cough or dyspnea Abdomen: soft, NT, ND.  No mass or HSM.  Active BS  Extremities: no CCE.   Neuro/Psych:  Pleasant, calm, oriented x 3.  Moves all 4 limbs, strength not tested.  No tremors.    Intake/Output from previous day: 01/26 0701 - 01/27 0700 In: 363 [P.O.:360; I.V.:3] Out: 877   Intake/Output this shift: Total I/O In: 237 [P.O.:237] Out: -   Lab Results: Recent Labs    09/01/18 1145 09/02/18 0410 09/02/18 2359  WBC 4.3 4.4 3.9*  HGB 11.6* 11.3* 13.5  HCT 37.9 35.6* 43.1  PLT 168 199 179   BMET Recent Labs    09/01/18 1145 09/02/18 0410 09/02/18 2359  NA 133* 133* 134*  K 4.5 4.6 3.4*  CL 93* 93* 92*  CO2 27 26 27   GLUCOSE 239* 91 100*  BUN 51* 55* 20  CREATININE 6.37* 7.04* 3.34*  CALCIUM 9.0 9.0 9.0   LFT Recent Labs    09/01/18 1145 09/02/18 0410  PROT 6.3* 5.9*  ALBUMIN 3.3* 3.1*  AST 16 22  ALT 11 11  ALKPHOS 66 57  BILITOT 0.9 1.1     ASSESMENT:   *   Painless hematochezia.    Last colonoscopy at least 15 yrs ago, no records of this No signif anemia.  Hgb 11.3 >> 13.5.  No transfusions to date.     *   ESRD on HD.    *   IDDM.    PLAN   *   Colonoscopy set for ~ 10 AM tomorrow.  Spoke with Dr Cruzita Lederer and he is fine with this, as is pt.  Will need to go to HD in afternoon.   See split dose prep orders.      Azucena Freed  09/03/2018, 10:12 AM Phone [phone]

## 2018-09-03 NOTE — Progress Notes (Signed)
[facility] Progress Note   Dialysis Orders: [facility](started 02/2017) 4h 87kg 400/800 2/2.25 bath Heparin 3000 load + 20102mdrun - mircera 50 ug every 4 wks ( last given 08/14/08) - hect 4 ug  Assessment/Plan: 1. Rectal bleeding- stable hgb - hold heparin - follow/GI on board  2. ESRD - TTS - had HD last evening off schedule - plan next HD Tuesday K 3.4 - drawn at the end of HD Sunday evening- system was about to clot and patient did not want to extend treatment so terminated at little early 3. Anemia - hgb 11.3 > 13.5 drawn at the end of HD not totally accurate- no ESA at present 4. Secondary hyperparathyroidism - Ca 9 binders/hectorol 5. HTN/volume - met IF 877 cc due to BP drop - BP labile - on carvedilol 25 bid and prn hydralazine 6. Nutrition - alb 3.1 CL advance as tol 7. DM - per primary  MMyriam Jacobson PA-C [id number]/27/2020,8:28 AM  LOS: 0 days   Subjective:   Nauseated this am - happens a lot. Also has right post head pain - woke up with it.  Normally takes neurontin 200 mg at HS for this. (100 ordered here). Rectal bleeding better  Objective Vitals:   09/02/18 2225 09/02/18 2244 09/02/18 2303 09/03/18 0516  BP: (!) 144/72 (!) 149/65 129/68 (!) 160/61  Pulse: 66 68 65 67  Resp: '15 18 20 18  ' Temp:  97.9 F (36.6 C) 97.8 F (36.6 C) 97.8 F (36.6 C)  TempSrc:  Oral Oral   SpO2: 96% 98% 97% 93%  Weight:  89 kg    Height:       Physical Exam General: NAD supine in bed Heart: RRR Lungs: glear anteriorly Abdomen: + BS soft NT ND Extremities: no LE edema, SCDs in place Dialysis Access: left upper AVF + bruit   Additional Objective Labs: Basic Metabolic Panel: Recent Labs  Lab 09/01/18 1145 09/02/18 0410 09/02/18 2359  NA 133* 133* 134*  K 4.5 4.6 3.4*  CL 93* 93* 92*  CO2 '27 26 27  ' GLUCOSE 239* 91 100*  BUN 51* 55* 20  CREATININE 6.37* 7.04* 3.34*  CALCIUM 9.0 9.0 9.0   Liver  Function Tests: Recent Labs  Lab 09/01/18 1145 09/02/18 0410  AST 16 22  ALT 11 11  ALKPHOS 66 57  BILITOT 0.9 1.1  PROT 6.3* 5.9*  ALBUMIN 3.3* 3.1*   No results for input(s): LIPASE, AMYLASE in the last 168 hours. CBC: Recent Labs  Lab 09/01/18 1145 09/02/18 0410 09/02/18 2359  WBC 4.3 4.4 3.9*  HGB 11.6* 11.3* 13.5  HCT 37.9 35.6* 43.1  MCV 98.4 96.7 97.1  PLT 168 199 179   Blood Culture    Component Value Date/Time   SDES URINE, RANDOM 02/08/2017 1351   SPECREQUEST NONE 02/08/2017 1351   CULT >=100,000 COLONIES/mL ESCHERICHIA COLI (A) 02/08/2017 1351   REPTSTATUS 02/11/2017 FINAL 02/08/2017 1351    Cardiac Enzymes: Recent Labs  Lab 09/02/18 1113 09/02/18 1548 09/02/18 2337  TROPONINI <0.03 <0.03 <0.03   CBG: Recent Labs  Lab 09/02/18 1132 09/02/18 1634 09/02/18 1828 09/02/18 2302 09/03/18 0817  GLUCAP 181* 187* 129* 96 114*   Iron Studies: No results for input(s): IRON, TIBC, TRANSFERRIN, FERRITIN in the last 72 hours. Lab Results  Component Value Date   INR 1.11 11/08/2017   INR 1.36 12/18/2015   INR 1.25 06/10/2014   Studies/Results: No results found. Medications: . sodium chloride     .  brimonidine  1 drop Both Eyes Q12H   And  . timolol  1 drop Both Eyes Q12H  . calcium acetate  2,001 mg Oral TID WC  . carvedilol  25 mg Oral BID  . Chlorhexidine Gluconate Cloth  6 each Topical Q0600  . dextrose  1 Tube Oral Once  . gabapentin  100 mg Oral QHS  . insulin aspart  0-9 Units Subcutaneous TID WC  . insulin detemir  10 Units Subcutaneous QHS  . latanoprost  1 drop Both Eyes QPM  . sodium chloride flush  3 mL Intravenous Q12H

## 2018-09-03 NOTE — Plan of Care (Signed)
  Problem: Coping: Goal: Level of anxiety will decrease Outcome: Progressing   Problem: Pain Managment: Goal: General experience of comfort will improve 09/03/2018 2053 by Roselind Rily, RN Outcome: Progressing 09/03/2018 2052 by Roselind Rily, RN Outcome: Progressing 09/03/2018 2052 by Roselind Rily, RN Outcome: Progressing   Problem: Safety: Goal: Ability to remain free from injury will improve 09/03/2018 2053 by Roselind Rily, RN Outcome: Progressing 09/03/2018 2052 by Roselind Rily, RN Outcome: Progressing 09/03/2018 2052 by Roselind Rily, RN Outcome: Progressing

## 2018-09-03 NOTE — Progress Notes (Signed)
PROGRESS NOTE  Kathleen [name] YTK:[id number] DOB: 1946/07/16 DOA: 09/01/2018 PCP: Benito Mccreedy, MD   LOS: 0 days   Brief Narrative / Interim history: 73 year old female with history of end-stage renal disease on HD TTS, type 2 diabetes mellitus, coronary artery disease, hypertension, hyperlipidemia who presented to the hospital with about 6 days of bright red blood per rectum.  She initially noticed a small amounts of blood on the toilet paper, however over the last few days this is progressively increased in quantity, and she became quite concerned and came to the emergency room.  She was hospitalized in 2017 with lower GI bleed, at that time was thought to be clear bleed.  She tells me that her bleed was much severe last time came on all of a sudden and went away suddenly as well.  She feels like this is different.  She had a colonoscopy about 15 years ago (per patient, thinks [facility] did it), but has not seen a gastroenterologist since  Subjective: -No complaints, denies any chest pain, no abdominal pain, nausea or vomiting  Assessment & Plan: Active Problems:   Rectal bleeding   Principal Problem Rectal bleeding -She has a history of diverticulosis however clinical picture does not really suggest diverticular bleed.  She has not had a GI evaluation in several years and this is her second episode of lower bleed.  I have consulted GI, discussed with Dr. Benson Norway, appreciate input -Hemoglobin is remained stable, slightly up probably following dialysis and fluid removal yesterday -Discussed with GI, plans for colonoscopy tomorrow  Active Problems Anemia, multifactorial due to end-stage renal disease as well as GI bleed -Hemoglobin has remained stable  Chest pain/history of CAD -On 1/26, lasting 1 to 2 minutes in the middle of her chest going to her back.  Resolved on its own without intervention.  EKG without significant ischemic changes.  Troponin negative x3.  She underwent a  cardiac catheterization in 2015 which showed moderate to severe mid RCA stenosis and no significant coronary artery disease in the circumflex or LAD, placed on medical therapy  Chronic systolic CHF -Echo in 7517 with EF of 25-30%, this is improved over time with medical management and most recent 2D echo in 2019 shows an EF of 60-65% with grade 1 diastolic dysfunction.  Currently appears euvolemic.   End-stage renal disease on HD -Nephrology consulted and following patient.  Underwent HD 1/26  Type 2 diabetes mellitus -Had an episode of hypoglycemia 1/20 6 AM, Levemir was decreased.  CBGs much better controlled, continue current regimen  Hypertension -Continue Coreg, blood pressure stable  Scheduled Meds: . bisacodyl  20 mg Oral Once  . brimonidine  1 drop Both Eyes Q12H   And  . timolol  1 drop Both Eyes Q12H  . calcium acetate  2,001 mg Oral TID WC  . carvedilol  25 mg Oral BID  . Chlorhexidine Gluconate Cloth  6 each Topical Q0600  . dextrose  1 Tube Oral Once  . [START ON 09/04/2018] doxercalciferol  4 mcg Intravenous Q T,Th,Sa-HD  . gabapentin  100 mg Oral QHS  . insulin aspart  0-9 Units Subcutaneous TID WC  . insulin detemir  10 Units Subcutaneous QHS  . latanoprost  1 drop Both Eyes QPM  . metoCLOPramide (REGLAN) injection  10 mg Intravenous Once   Followed by  . [START ON 09/04/2018] metoCLOPramide (REGLAN) injection  10 mg Intravenous Once  . multivitamin  1 tablet Oral QHS  . peg 3350 powder  0.5 kit Oral Once   And  . peg 3350 powder  0.5 kit Oral Once  . sodium chloride flush  3 mL Intravenous Q12H   Continuous Infusions: . sodium chloride     PRN Meds:.sodium chloride, acetaminophen, hydrALAZINE, ondansetron **OR** ondansetron (ZOFRAN) IV, sodium chloride flush  DVT prophylaxis: SCDs Code Status: Full code Family Communication: No family present at bedside Disposition Plan: Home when ready  Consultants:   Gastroenterology  Nephrology  Procedures:    None  Antimicrobials:  None  Objective: Vitals:   09/02/18 2225 09/02/18 2244 09/02/18 2303 09/03/18 0516  BP: (!) 144/72 (!) 149/65 129/68 (!) 160/61  Pulse: 66 68 65 67  Resp: '15 18 20 18  ' Temp:  97.9 F (36.6 C) 97.8 F (36.6 C) 97.8 F (36.6 C)  TempSrc:  Oral Oral   SpO2: 96% 98% 97% 93%  Weight:  89 kg    Height:        Intake/Output Summary (Last 24 hours) at 09/03/2018 1144 Last data filed at 09/03/2018 1045 Gross per 24 hour  Intake 600 ml  Output 877 ml  Net -277 ml   Filed Weights   09/01/18 1713 09/02/18 1847 09/02/18 2244  Weight: 88.8 kg 89.7 kg 89 kg    Examination:  Constitutional: NAD Eyes: No scleral icterus ENMT: mmm.  Respiratory: Clear to auscultation bilaterally without wheezing or crackles.  Normal respiratory effort Cardiovascular: Regular rate and rhythm, no edema Abdomen: Soft, nontender, nondistended, positive bowel sounds Musculoskeletal: no clubbing / cyanosis.  Skin: No rashes seen Neurologic: No focal deficits Psychiatric: Normal judgment and insight. Alert and oriented x 3. Normal mood.    Data Reviewed: I have independently reviewed following labs and imaging studies   CBC: Recent Labs  Lab 09/01/18 1145 09/02/18 0410 09/02/18 2359  WBC 4.3 4.4 3.9*  HGB 11.6* 11.3* 13.5  HCT 37.9 35.6* 43.1  MCV 98.4 96.7 97.1  PLT 168 199 161   Basic Metabolic Panel: Recent Labs  Lab 09/01/18 1145 09/02/18 0410 09/02/18 2359  NA 133* 133* 134*  K 4.5 4.6 3.4*  CL 93* 93* 92*  CO2 '27 26 27  ' GLUCOSE 239* 91 100*  BUN 51* 55* 20  CREATININE 6.37* 7.04* 3.34*  CALCIUM 9.0 9.0 9.0   GFR: Estimated Creatinine Clearance: 15.9 mL/min (A) (by C-G formula based on SCr of 3.34 mg/dL (H)). Liver Function Tests: Recent Labs  Lab 09/01/18 1145 09/02/18 0410  AST 16 22  ALT 11 11  ALKPHOS 66 57  BILITOT 0.9 1.1  PROT 6.3* 5.9*  ALBUMIN 3.3* 3.1*   No results for input(s): LIPASE, AMYLASE in the last 168 hours. No results  for input(s): AMMONIA in the last 168 hours. Coagulation Profile: No results for input(s): INR, PROTIME in the last 168 hours. Cardiac Enzymes: Recent Labs  Lab 09/02/18 1113 09/02/18 1548 09/02/18 2337  TROPONINI <0.03 <0.03 <0.03   BNP (last 3 results) No results for input(s): PROBNP in the last 8760 hours. HbA1C: Recent Labs    09/01/18 1145  HGBA1C 9.1*   CBG: Recent Labs  Lab 09/02/18 1634 09/02/18 1828 09/02/18 2302 09/03/18 0817 09/03/18 0822  GLUCAP 187* 129* 96 114* 123*   Lipid Profile: No results for input(s): CHOL, HDL, LDLCALC, TRIG, CHOLHDL, LDLDIRECT in the last 72 hours. Thyroid Function Tests: No results for input(s): TSH, T4TOTAL, FREET4, T3FREE, THYROIDAB in the last 72 hours. Anemia Panel: No results for input(s): VITAMINB12, FOLATE, FERRITIN, TIBC, IRON, RETICCTPCT in the last 72  hours. Urine analysis:    Component Value Date/Time   COLORURINE YELLOW 07/13/2017 2335   APPEARANCEUR CLEAR 07/13/2017 2335   LABSPEC 1.009 07/13/2017 2335   PHURINE 6.0 07/13/2017 2335   GLUCOSEU NEGATIVE 07/13/2017 2335   HGBUR NEGATIVE 07/13/2017 2335   BILIRUBINUR NEGATIVE 07/13/2017 2335   KETONESUR NEGATIVE 07/13/2017 2335   PROTEINUR 100 (A) 07/13/2017 2335   UROBILINOGEN 0.2 06/11/2014 0639   NITRITE NEGATIVE 07/13/2017 2335   LEUKOCYTESUR NEGATIVE 07/13/2017 2335   Sepsis Labs: Invalid input(s): PROCALCITONIN, LACTICIDVEN  No results found for this or any previous visit (from the past 240 hour(s)).    Radiology Studies: No results found.   Marzetta Board, MD, PhD [facility]  Contact via  [email or web address]  [place] P: [phone]  F: [phone]

## 2018-09-03 NOTE — H&P (View-Only) (Signed)
          Daily Rounding Note  09/03/2018, 10:12 AM  LOS: 0 days   SUBJECTIVE:   Chief complaint: hematochezia for several days.   Last BM was yesterday AM, still bloody then.  Intermittent nausea but no abd pain. Colonoscopy prep and procedure cancelled due to MD wanting to r/o MI given (resolved) CP.  Also returned to floor from HD at midnight, never started prep.    Hx presumed diverticular bleed in 2017.       OBJECTIVE:         Vital signs in last 24 hours:    Temp:  [97.8 F (36.6 C)-98.2 F (36.8 C)] 97.8 F (36.6 C) (01/27 0516) Pulse Rate:  [59-69] 67 (01/27 0516) Resp:  [14-26] 18 (01/27 0516) BP: (103-211)/(51-100) 160/61 (01/27 0516) SpO2:  [93 %-99 %] 93 % (01/27 0516) Weight:  [89 kg-89.7 kg] 89 kg (01/26 2244) Last BM Date: 09/02/18 Filed Weights   09/01/18 1713 09/02/18 1847 09/02/18 2244  Weight: 88.8 kg 89.7 kg 89 kg   General: pleasant, comfortable, not ill looking   Heart: RRR Chest: clear bil.  No cough or dyspnea Abdomen: soft, NT, ND.  No mass or HSM.  Active BS  Extremities: no CCE.   Neuro/Psych:  Pleasant, calm, oriented x 3.  Moves all 4 limbs, strength not tested.  No tremors.    Intake/Output from previous day: 01/26 0701 - 01/27 0700 In: 363 [P.O.:360; I.V.:3] Out: 877   Intake/Output this shift: Total I/O In: 237 [P.O.:237] Out: -   Lab Results: Recent Labs    09/01/18 1145 09/02/18 0410 09/02/18 2359  WBC 4.3 4.4 3.9*  HGB 11.6* 11.3* 13.5  HCT 37.9 35.6* 43.1  PLT 168 199 179   BMET Recent Labs    09/01/18 1145 09/02/18 0410 09/02/18 2359  NA 133* 133* 134*  K 4.5 4.6 3.4*  CL 93* 93* 92*  CO2 27 26 27   GLUCOSE 239* 91 100*  BUN 51* 55* 20  CREATININE 6.37* 7.04* 3.34*  CALCIUM 9.0 9.0 9.0   LFT Recent Labs    09/01/18 1145 09/02/18 0410  PROT 6.3* 5.9*  ALBUMIN 3.3* 3.1*  AST 16 22  ALT 11 11  ALKPHOS 66 57  BILITOT 0.9 1.1     ASSESMENT:   *   Painless hematochezia.    Last colonoscopy at least 15 yrs ago, no records of this No signif anemia.  Hgb 11.3 >> 13.5.  No transfusions to date.     *   ESRD on HD.    *   IDDM.    PLAN   *   Colonoscopy set for ~ 10 AM tomorrow.  Spoke with Dr Cruzita Lederer and he is fine with this, as is pt.  Will need to go to HD in afternoon.   See split dose prep orders.      Azucena Freed  09/03/2018, 10:12 AM Phone [phone]

## 2018-09-04 ENCOUNTER — Inpatient Hospital Stay (HOSPITAL_COMMUNITY): Payer: Medicare Other | Admitting: Anesthesiology

## 2018-09-04 ENCOUNTER — Encounter (HOSPITAL_COMMUNITY)
Admission: EM | Disposition: A | Discharge status: Home / Self Care | Payer: Self-pay | Source: Home / Self Care | Attending: Internal Medicine

## 2018-09-04 ENCOUNTER — Encounter (HOSPITAL_COMMUNITY): Payer: Self-pay | Admitting: *Deleted

## 2018-09-04 DIAGNOSIS — D120 Benign neoplasm of cecum: Secondary | ICD-10-CM

## 2018-09-04 DIAGNOSIS — K921 Melena: Secondary | ICD-10-CM

## 2018-09-04 DIAGNOSIS — K2990 Gastroduodenitis, unspecified, without bleeding: Secondary | ICD-10-CM

## 2018-09-04 DIAGNOSIS — D125 Benign neoplasm of sigmoid colon: Secondary | ICD-10-CM

## 2018-09-04 DIAGNOSIS — D122 Benign neoplasm of ascending colon: Secondary | ICD-10-CM

## 2018-09-04 DIAGNOSIS — K2970 Gastritis, unspecified, without bleeding: Secondary | ICD-10-CM

## 2018-09-04 HISTORY — PX: POLYPECTOMY: SHX5525

## 2018-09-04 HISTORY — PX: ESOPHAGOGASTRODUODENOSCOPY (EGD) WITH PROPOFOL: SHX5813

## 2018-09-04 HISTORY — PX: COLONOSCOPY WITH PROPOFOL: SHX5780

## 2018-09-04 HISTORY — PX: BIOPSY: SHX5522

## 2018-09-04 LAB — CBC
HCT: 36.3 % (ref 36.0–46.0)
Hemoglobin: 11.2 g/dL — ABNORMAL LOW (ref 12.0–15.0)
MCH: 30 pg (ref 26.0–34.0)
MCHC: 30.9 g/dL (ref 30.0–36.0)
MCV: 97.3 fL (ref 80.0–100.0)
Platelets: 187 10*3/uL (ref 150–400)
RBC: 3.73 MIL/uL — ABNORMAL LOW (ref 3.87–5.11)
RDW: 13.5 % (ref 11.5–15.5)
WBC: 4.4 10*3/uL (ref 4.0–10.5)
nRBC: 0 % (ref 0.0–0.2)

## 2018-09-04 LAB — BASIC METABOLIC PANEL
Anion gap: 13 (ref 5–15)
BUN: 25 mg/dL — ABNORMAL HIGH (ref 8–23)
CALCIUM: 9.1 mg/dL (ref 8.9–10.3)
CO2: 27 mmol/L (ref 22–32)
Chloride: 91 mmol/L — ABNORMAL LOW (ref 98–111)
Creatinine, Ser: 4.78 mg/dL — ABNORMAL HIGH (ref 0.44–1.00)
GFR, EST AFRICAN AMERICAN: 10 mL/min — AB (ref >60)
GFR, EST NON AFRICAN AMERICAN: 8 mL/min — AB (ref >60)
Glucose, Bld: 124 mg/dL — ABNORMAL HIGH (ref 70–99)
Potassium: 4.1 mmol/L (ref 3.5–5.1)
Sodium: 131 mmol/L — ABNORMAL LOW (ref 135–145)

## 2018-09-04 LAB — GLUCOSE, CAPILLARY
GLUCOSE-CAPILLARY: 102 mg/dL — AB (ref 70–99)
GLUCOSE-CAPILLARY: 123 mg/dL — AB (ref 70–99)
Glucose-Capillary: 103 mg/dL — ABNORMAL HIGH (ref 70–99)

## 2018-09-04 SURGERY — COLONOSCOPY WITH PROPOFOL
Anesthesia: Monitor Anesthesia Care

## 2018-09-04 MED ORDER — PANTOPRAZOLE SODIUM 40 MG PO TBEC: 40.0000 mg | DELAYED_RELEASE_TABLET | Freq: Two times a day (BID) | ORAL | 1 refills | Status: AC

## 2018-09-04 MED ORDER — PHENYLEPHRINE 40 MCG/ML (10ML) SYRINGE FOR IV PUSH (FOR BLOOD PRESSURE SUPPORT)
PREFILLED_SYRINGE | INTRAVENOUS | Status: DC | PRN
  Administered 2018-09-04: 80 ug via INTRAVENOUS
  Administered 2018-09-04: 120 ug via INTRAVENOUS
  Administered 2018-09-04: 80 ug via INTRAVENOUS
  Administered 2018-09-04: 40 ug via INTRAVENOUS
  Administered 2018-09-04: 80 ug via INTRAVENOUS

## 2018-09-04 MED ORDER — PROPOFOL 500 MG/50ML IV EMUL
INTRAVENOUS | Status: DC | PRN
  Administered 2018-09-04: 150 ug/kg/min via INTRAVENOUS

## 2018-09-04 MED ORDER — EPHEDRINE SULFATE-NACL 50-0.9 MG/10ML-% IV SOSY
PREFILLED_SYRINGE | INTRAVENOUS | Status: DC | PRN
  Administered 2018-09-04 (×3): 5 mg via INTRAVENOUS
  Administered 2018-09-04: 10 mg via INTRAVENOUS

## 2018-09-04 MED ORDER — PANTOPRAZOLE SODIUM 40 MG PO TBEC
40.0000 mg | DELAYED_RELEASE_TABLET | Freq: Two times a day (BID) | ORAL | Status: DC
  Administered 2018-09-04: 40 mg via ORAL
  Filled 2018-09-04: qty 1

## 2018-09-04 SURGICAL SUPPLY — 22 items

## 2018-09-04 NOTE — Progress Notes (Signed)
Discharged home accompanied by patient's son. Discharged instruction,personal belongings,prescription given to patient.verbalized understanding of instructions

## 2018-09-04 NOTE — Progress Notes (Signed)
Back from dialysis via bed. Alert,oriented x4

## 2018-09-04 NOTE — Interval H&P Note (Signed)
History and Physical Interval Note: CBC Latest Ref Rng & Units 09/04/2018 09/02/2018 09/02/2018  WBC 4.0 - 10.5 K/uL 4.4 3.9(L) 4.4  Hemoglobin 12.0 - 15.0 g/dL 11.2(L) 13.5 11.3(L)  Hematocrit 36.0 - 46.0 % 36.3 43.1 35.6(L)  Platelets 150 - 400 K/uL 187 179 199   For colonoscopy to evaluate hematochezia.  Discussed with her possible upper endoscopy if colonoscopy is unrevealing.  We discussed the risk, benefits and alternatives and she is agreeable to both colonoscopy and upper endoscopy if felt needed.    09/04/2018 9:44 AM  Kathleen [name]  has presented today for surgery, with the diagnosis of look for source of bleeding  The various methods of treatment have been discussed with the patient and family. After consideration of risks, benefits and other options for treatment, the patient has consented to  Procedure(s): COLONOSCOPY WITH PROPOFOL (N/A) as a surgical intervention .  The patient's history has been reviewed, patient examined, no change in status, stable for surgery.  I have reviewed the patient's chart and labs.  Questions were answered to the patient's satisfaction.     Lajuan Lines [name]

## 2018-09-04 NOTE — Progress Notes (Addendum)
[facility] Progress Note   Dialysis Orders: [facility](started 02/2017) 4h 87kg 400/800 2/2.25 bath Heparin 3000 load + 2071midrun - mircera 50 ug every 4 wks ( last given 08/14/08) - hect 4 ug  Assessment/Plan: 1. Rectal bleeding- hx of previous diverticular bleed hgb remains stable - hold heparin - colonoscopy today 2. ESRD - TTS -  K 4.1-- use added K bath today 3. Anemia - hgb 11.3 > 11.2 no ESA at present 4. Secondary hyperparathyroidism - Ca 9.1 binders/hectorol 5. HTN/volume - net UF 877 cc due to BP drop - BP labile - on carvedilol 25 bid and prn hydralazine- UF as BP allows 6. Nutrition - alb 3.1 advance diet as able 7. DM - per primary  Myriam Jacobson, PA-C [place] [phone] 09/04/2018,8:16 AM  LOS: 1 day   Subjective:  Weak this am.  For colonoscopy this am. Had prep last pm. Bloody stools. HD this pm.  Objective Vitals:   09/03/18 0516 09/03/18 1423 09/03/18 2110 09/04/18 0414  BP: (!) 160/61 (!) 153/75 (!) 160/97 (!) 141/60  Pulse: 67 62 67 (!) 58  Resp: 18 16 18 18   Temp: 97.8 F (36.6 C) 97.7 F (36.5 C) 97.7 F (36.5 C) (!) 97.5 F (36.4 C)  TempSrc:  Oral Oral Oral  SpO2: 93% 98% 97% 97%  Weight:      Height:       Physical Exam General: NAD supine in bed Heart: RRR Lungs: glear anteriorly Abdomen: + BS soft NT ND Extremities: no sig LE edema Dialysis Access: left upper AVF + bruit   Additional Objective Labs: Basic Metabolic Panel: Recent Labs  Lab 09/02/18 0410 09/02/18 2359 09/04/18 0338  NA 133* 134* 131*  K 4.6 3.4* 4.1  CL 93* 92* 91*  CO2 26 27 27   GLUCOSE 91 100* 124*  BUN 55* 20 25*  CREATININE 7.04* 3.34* 4.78*  CALCIUM 9.0 9.0 9.1   Liver Function Tests: Recent Labs  Lab 09/01/18 1145 09/02/18 0410  AST 16 22  ALT 11 11  ALKPHOS 66 57  BILITOT 0.9 1.1  PROT 6.3* 5.9*  ALBUMIN 3.3* 3.1*   No results for input(s): LIPASE, AMYLASE in the last 168  hours. CBC: Recent Labs  Lab 09/01/18 1145 09/02/18 0410 09/02/18 2359 09/04/18 0338  WBC 4.3 4.4 3.9* 4.4  HGB 11.6* 11.3* 13.5 11.2*  HCT 37.9 35.6* 43.1 36.3  MCV 98.4 96.7 97.1 97.3  PLT 168 199 179 187  Cardiac Enzymes: Recent Labs  Lab 09/02/18 1113 09/02/18 1548 09/02/18 2337  TROPONINI <0.03 <0.03 <0.03   CBG: Recent Labs  Lab 09/03/18 0817 09/03/18 0822 09/03/18 1255 09/03/18 1722 09/03/18 2107  GLUCAP 114* 123* 151* 75 135*   Iron Studies: No results for input(s): IRON, TIBC, TRANSFERRIN, FERRITIN in the last 72 hours. Lab Results  Component Value Date   INR 1.11 11/08/2017   INR 1.36 12/18/2015   INR 1.25 06/10/2014   Studies/Results: No results found. Medications: . sodium chloride     . brimonidine  1 drop Both Eyes Q12H   And  . timolol  1 drop Both Eyes Q12H  . calcium acetate  2,001 mg Oral TID WC  . carvedilol  25 mg Oral BID  . Chlorhexidine Gluconate Cloth  6 each Topical Q0600  . dextrose  1 Tube Oral Once  . doxercalciferol  4 mcg Intravenous Q T,Th,Sa-HD  . gabapentin  100 mg Oral QHS  . insulin aspart  0-9  Units Subcutaneous TID WC  . insulin detemir  10 Units Subcutaneous QHS  . latanoprost  1 drop Both Eyes QPM  . multivitamin  1 tablet Oral QHS  . sodium chloride flush  3 mL Intravenous Q12H

## 2018-09-04 NOTE — Discharge Summary (Signed)
Physician Discharge Summary  Kathleen [name] IHW:[id number] DOB: April 05, 1946 DOA: 09/01/2018  PCP: Benito Mccreedy, MD  Admit date: 09/01/2018 Discharge date: 09/04/2018  Admitted From: home Disposition:  home  Recommendations for Outpatient Follow-up:  1. Follow up with PCP in 1-2 weeks 2. Follow-up with gastroenterology in 2 weeks 3. Outpatient PCP or gastroenterology, please follow-up on biopsy results regarding H. pylori  Home Health: None Equipment/Devices: None  Discharge Condition: Stable CODE STATUS: Full code Diet recommendation: Renal  HPI: Per admitting MD, Kathleen [name]  is a 73 y.o. female, with history of ESRD on hemodialysis Tuesday Thursday Saturday, diabetes mellitus type 2, CAD, hypertension, hyperlipidemia came to hospital with 4-day history of noticing blood on the toilet paper.  Patient says that this morning she woke up and felt weak, did not go for dialysis.  Came to ED for further evaluation.  Patient denies abdominal pain.  No nausea vomiting or diarrhea.  Did not pass out.  No blurred vision. Patient had a similar presentation in 2017 at that time CT scan of the abdomen showed diverticulosis. Patient also had colonoscopy within the past 10 years,(no records available) which was normal as per patient. She has no previous history of hemorrhoids. Denies chest pain or shortness of breath. No previous history of stroke or seizures. In the ED lab work showed hemoglobin 11.6.  Hospital Course: Principal Problem Rectal bleeding -patient was admitted to the hospital with several days of rectal bleeding at home, progressively getting worse.  She was hospitalized in 2017 for same, felt to be diverticular bleed.  Gastroenterology has been consulted and evaluated patient while hospitalized.  She underwent an upper endoscopy which showed gastritis for which she will be placed on PPI, also underwent a colonoscopy in which several polyps were removed and extensive  diverticulosis was noted, felt to be the source of the bleed.  Her hemoglobin overall remained stable and she did not require transfusions.  She has remained stable, she is no longer bleeding, and will be discharged home in stable condition.  Active Problems Anemia, multifactorial due to end-stage renal disease as well as GI bleed -Hemoglobin has remained stable  Chest pain/history of CAD -On 1/26, lasting 1 to 2 minutes in the middle of her chest going to her back.  Resolved on its own without intervention.  EKG without significant ischemic changes.  Troponin negative x3.  She underwent a cardiac catheterization in 2015 which showed moderate to severe mid RCA stenosis and no significant coronary artery disease in the circumflex or LAD, placed on medical therapy.  Continue medical therapy on discharge.  No further chest pain   Chronic systolic CHF -Echo in 4917 with EF of 25-30%, this is improved over time with medical management and most recent 2D echo in 2019 shows an EF of 60-65% with grade 1 diastolic dysfunction.  Currently appears euvolemic.   End-stage renal disease on HD -Nephrology consulted and following patient.  Underwent HD 1/26 and 1/28 prior to discharge  Type 2 diabetes mellitus -continue home medication  Hypertension -Continue Coreg, blood pressure stable  Discharge Diagnoses:  Active Problems:   Rectal bleeding   Hematochezia   Benign neoplasm of cecum   Benign neoplasm of ascending colon   Benign neoplasm of sigmoid colon   Gastritis and gastroduodenitis  Discharge Instructions  Allergies as of 09/04/2018      Reactions   Baclofen    Severe delirium when given to pateint in Dec 2018.  Medication List    TAKE these medications   acetaminophen 325 MG tablet Commonly known as:  TYLENOL Take 2 tablets (650 mg total) by mouth every 4 (four) hours as needed for mild pain (or temp > 37.5 C (99.5 F)).   aspirin 81 MG EC tablet Take 4 tablets (325 mg total)  by mouth daily. What changed:  how much to take   atorvastatin 40 MG tablet Commonly known as:  LIPITOR Take 1 tablet (40 mg total) by mouth daily at 6 PM.   brimonidine-timolol 0.2-0.5 % ophthalmic solution Commonly known as:  COMBIGAN Place 1 drop into both eyes every 12 (twelve) hours.   calcitRIOL 0.5 MCG capsule Commonly known as:  ROCALTROL Take 1 capsule (0.5 mcg total) by mouth Every Tuesday,Thursday,and Saturday with dialysis.   calcium acetate 667 MG capsule Commonly known as:  PHOSLO Take 1 capsule (667 mg total) by mouth as needed (with snacks). What changed:    how much to take  when to take this  additional instructions   carvedilol 25 MG tablet Commonly known as:  COREG Take 25 mg by mouth 2 (two) times daily.   gabapentin 100 MG capsule Commonly known as:  NEURONTIN Take 100 mg by mouth at bedtime.   hydrALAZINE 25 MG tablet Commonly known as:  APRESOLINE Take 1 tablet (25 mg total) by mouth every 8 (eight) hours as needed (SBP > 170  or DBP > 110). What changed:  how much to take   insulin detemir 100 UNIT/ML injection Commonly known as:  LEVEMIR Inject 0.15 mLs (15 Units total) into the skin at bedtime.   latanoprost 0.005 % ophthalmic solution Commonly known as:  XALATAN Place 1 drop into both eyes every evening.   multivitamin Tabs tablet Take 1 tablet by mouth at bedtime.   pantoprazole 40 MG tablet Commonly known as:  PROTONIX Take 1 tablet (40 mg total) by mouth 2 (two) times daily before a meal. Take 1 tablet twice daily for a  Month then once dailly   polyethylene glycol packet Commonly known as:  MIRALAX / GLYCOLAX Take 17 g by mouth daily. What changed:    when to take this  reasons to take this   RENAPLEX Tabs Take 1 tablet by mouth daily.      Follow-up Information    Pyrtle, Lajuan Lines, MD. Schedule an appointment as soon as possible for a visit in 2 week(s).   Specialty:  Gastroenterology Contact information: [address]  [place] [address] [phone]           Consultations:  Gastroenterology   Procedures/Studies:  Colonoscopy Impression:               - One 6 mm polyp in the cecum, removed with a cold                            snare. Resected and retrieved.                           - Two 3 to 4 mm polyps in the ascending colon,                            removed with a cold snare. Resected and retrieved.                           -  One 8 mm polyp in the sigmoid colon, removed with                            a hot snare. Resected and retrieved.                           - Severe diverticulosis from cecum to sigmoid                            colon. There was no evidence of diverticular                            bleeding, though this is most likely source of                            recent/now resolved hematochezia.                           - Internal hemorrhoids.                           - No blood was seen in the examined colon.   EGD Impression:               - LA Grade A reflux esophagitis.                           - 1 cm hiatal hernia.                           - Gastritis. Biopsied. While likely not source of                            large volume hematochezia, certainly a potential                            source of blood loss.                           - Mild duodenitis.                           - Normal second portion of the duodenum.   No results found.   Subjective: - no chest pain, shortness of breath, no abdominal pain, nausea or vomiting.   Discharge Exam: Vitals:   09/04/18 1100 09/04/18 1110  BP: (!) 102/41 (!) 156/64  Pulse: 70 67  Resp: 19 17  Temp:    SpO2: 100% 100%    General: Pt is alert, awake, not in acute distress Cardiovascular: RRR, S1/S2 +, no rubs, no gallops Respiratory: CTA bilaterally, no wheezing, no rhonchi Abdominal: Soft, NT, ND, bowel sounds + Extremities: no edema, no cyanosis    The results of significant  diagnostics from this hospitalization (including imaging, microbiology, ancillary and laboratory) are listed below for reference.     Microbiology: No results found for this or any previous visit (from the past 240 hour(s)).   Labs: BNP (last 3 results) No results for input(s):  BNP in the last 8760 hours. Basic Metabolic Panel: Recent Labs  Lab 09/01/18 1145 09/02/18 0410 09/02/18 2359 09/04/18 0338  NA 133* 133* 134* 131*  K 4.5 4.6 3.4* 4.1  CL 93* 93* 92* 91*  CO2 27 26 27 27   GLUCOSE 239* 91 100* 124*  BUN 51* 55* 20 25*  CREATININE 6.37* 7.04* 3.34* 4.78*  CALCIUM 9.0 9.0 9.0 9.1   Liver Function Tests: Recent Labs  Lab 09/01/18 1145 09/02/18 0410  AST 16 22  ALT 11 11  ALKPHOS 66 57  BILITOT 0.9 1.1  PROT 6.3* 5.9*  ALBUMIN 3.3* 3.1*   No results for input(s): LIPASE, AMYLASE in the last 168 hours. No results for input(s): AMMONIA in the last 168 hours. CBC: Recent Labs  Lab 09/01/18 1145 09/02/18 0410 09/02/18 2359 09/04/18 0338  WBC 4.3 4.4 3.9* 4.4  HGB 11.6* 11.3* 13.5 11.2*  HCT 37.9 35.6* 43.1 36.3  MCV 98.4 96.7 97.1 97.3  PLT 168 199 179 187   Cardiac Enzymes: Recent Labs  Lab 09/02/18 1113 09/02/18 1548 09/02/18 2337  TROPONINI <0.03 <0.03 <0.03   BNP: Invalid input(s): POCBNP CBG: Recent Labs  Lab 09/03/18 1255 09/03/18 1722 09/03/18 2107 09/04/18 0805 09/04/18 1251  GLUCAP 151* 75 135* 102* 123*   D-Dimer No results for input(s): DDIMER in the last 72 hours. Hgb A1c No results for input(s): HGBA1C in the last 72 hours. Lipid Profile No results for input(s): CHOL, HDL, LDLCALC, TRIG, CHOLHDL, LDLDIRECT in the last 72 hours. Thyroid function studies No results for input(s): TSH, T4TOTAL, T3FREE, THYROIDAB in the last 72 hours.  Invalid input(s): FREET3 Anemia work up No results for input(s): VITAMINB12, FOLATE, FERRITIN, TIBC, IRON, RETICCTPCT in the last 72 hours. Urinalysis    Component Value Date/Time    COLORURINE YELLOW 07/13/2017 2335   APPEARANCEUR CLEAR 07/13/2017 2335   LABSPEC 1.009 07/13/2017 2335   PHURINE 6.0 07/13/2017 2335   GLUCOSEU NEGATIVE 07/13/2017 2335   HGBUR NEGATIVE 07/13/2017 2335   BILIRUBINUR NEGATIVE 07/13/2017 2335   [place] 07/13/2017 2335   PROTEINUR 100 (A) 07/13/2017 2335   UROBILINOGEN 0.2 06/11/2014 0639   NITRITE NEGATIVE 07/13/2017 2335   LEUKOCYTESUR NEGATIVE 07/13/2017 2335   Sepsis Labs Invalid input(s): PROCALCITONIN,  WBC,  LACTICIDVEN   Time coordinating discharge: 35 minutes  SIGNED:  Marzetta Board, MD  [facility] 09/04/2018, 2:05 PM

## 2018-09-04 NOTE — Discharge Instructions (Signed)
Follow with Benito Mccreedy, MD in 5-7 days  Please get a complete blood count and chemistry panel checked by your Primary MD at your next visit, and again as instructed by your Primary MD. Please get your medications reviewed and adjusted by your Primary MD.  Please request your Primary MD to go over all Hospital Tests and Procedure/Radiological results at the follow up, please get all Hospital records sent to your Prim MD by signing hospital release before you go home.  If you had Pneumonia of Lung problems at the Hospital: Please get a 2 view Chest X ray done in 6-8 weeks after hospital discharge or sooner if instructed by your Primary MD.  If you have Congestive Heart Failure: Please call your Cardiologist or Primary MD anytime you have any of the following symptoms:  1) 3 pound weight gain in 24 hours or 5 pounds in 1 week  2) shortness of breath, with or without a dry hacking cough  3) swelling in the hands, feet or stomach  4) if you have to sleep on extra pillows at night in order to breathe  Follow cardiac low salt diet and 1.5 lit/day fluid restriction.  If you have diabetes Accuchecks 4 times/day, Once in AM empty stomach and then before each meal. Log in all results and show them to your primary doctor at your next visit. If any glucose reading is under 80 or above 300 call your primary MD immediately.  If you have Seizure/Convulsions/Epilepsy: Please do not drive, operate heavy machinery, participate in activities at heights or participate in high speed sports until you have seen by Primary MD or a Neurologist and advised to do so again.  If you had Gastrointestinal Bleeding: Please ask your Primary MD to check a complete blood count within one week of discharge or at your next visit. Your endoscopic/colonoscopic biopsies that are pending at the time of discharge, will also need to followed by your Primary MD.  Get Medicines reviewed and adjusted. Please take all your  medications with you for your next visit with your Primary MD  Please request your Primary MD to go over all hospital tests and procedure/radiological results at the follow up, please ask your Primary MD to get all Hospital records sent to his/her office.  If you experience worsening of your admission symptoms, develop shortness of breath, life threatening emergency, suicidal or homicidal thoughts you must seek medical attention immediately by calling 911 or calling your MD immediately  if symptoms less severe.  You must read complete instructions/literature along with all the possible adverse reactions/side effects for all the Medicines you take and that have been prescribed to you. Take any new Medicines after you have completely understood and accpet all the possible adverse reactions/side effects.   Do not drive or operate heavy machinery when taking Pain medications.   Do not take more than prescribed Pain, Sleep and Anxiety Medications  Special Instructions: If you have smoked or chewed Tobacco  in the last 2 yrs please stop smoking, stop any regular Alcohol  and or any Recreational drug use.  Wear Seat belts while driving.  Please note You were cared for by a hospitalist during your hospital stay. If you have any questions about your discharge medications or the care you received while you were in the hospital after you are discharged, you can call the unit and asked to speak with the hospitalist on call if the hospitalist that took care of you is not available. Once  you are discharged, your primary care physician will handle any further medical issues. Please note that NO REFILLS for any discharge medications will be authorized once you are discharged, as it is imperative that you return to your primary care physician (or establish a relationship with a primary care physician if you do not have one) for your aftercare needs so that they can reassess your need for medications and monitor your  lab values.  You can reach the hospitalist office at phone [phone] or fax [phone]   If you do not have a primary care physician, you can call [phone] for a physician referral.  Activity: As tolerated with Full fall precautions use walker/cane & assistance as needed  Diet: renal  Disposition Home

## 2018-09-04 NOTE — Anesthesia Preprocedure Evaluation (Addendum)
Anesthesia Evaluation  Patient identified by MRN, date of birth, ID band Patient awake    Reviewed: Allergy & Precautions, NPO status , Patient's Chart, lab work & pertinent test results  Airway Mallampati: II  TM Distance: >3 FB Neck ROM: Full    Dental  (+) Teeth Intact, Dental Advisory Given   Pulmonary former smoker,    Pulmonary exam normal        Cardiovascular hypertension, Pt. on medications and Pt. on home beta blockers + CAD, + Peripheral Vascular Disease and +CHF  Normal cardiovascular exam  ECHO 4/19  Study Conclusions  Left ventricle: The cavity size was normal. There was moderate   concentric hypertrophy. Systolic function was normal. The   estimated ejection fraction was in the range of 60% to 65%. Wall   motion was normal; there were no regional wall motion   abnormalities. There was an increased relative contribution of   atrial contraction to ventricular filling. Doppler parameters are   consistent with abnormal left ventricular relaxation (grade 1   diastolic dysfunction). Doppler parameters are consistent with   high ventricular filling pressure. - Mitral valve: Calcified annulus. - Tricuspid valve: There was trivial regurgitation.    Neuro/Psych    GI/Hepatic   Endo/Other  diabetes, Type 2, Insulin Dependent  Renal/GU ESRF and DialysisRenal disease     Musculoskeletal   Abdominal   Peds  Hematology   Anesthesia Other Findings   Reproductive/Obstetrics                           Anesthesia Physical  Anesthesia Plan  ASA: III  Anesthesia Plan: MAC   Post-op Pain Management:    Induction: Intravenous  PONV Risk Score and Plan: 2 and Ondansetron and Treatment may vary due to age or medical condition  Airway Management Planned: Nasal Cannula and Simple Face Mask  Additional Equipment:   Intra-op Plan:   Post-operative Plan:   Informed Consent: I have  reviewed the patients History and Physical, chart, labs and discussed the procedure including the risks, benefits and alternatives for the proposed anesthesia with the patient or authorized representative who has indicated his/her understanding and acceptance.       Plan Discussed with: CRNA, Surgeon and Anesthesiologist  Anesthesia Plan Comments:         Anesthesia Quick Evaluation

## 2018-09-04 NOTE — Progress Notes (Signed)
To Dialysis at 1259hr via bed

## 2018-09-04 NOTE — Op Note (Signed)
[facility] Patient Name: Kathleen [name] Procedure Date : 09/04/2018 MRN: [id number] Attending MD: Jerene Bears , MD Date of Birth: 1945-10-27 CSN: [id number] Age: 73 Admit Type: Inpatient Procedure:                Upper GI endoscopy Indications:              Hematochezia Providers:                Lajuan Lines. Hilarie Fredrickson, MD, Zenon Mayo, RN, Cherylynn Ridges,                            Technician, Judeth Cornfield, CRNA Referring MD:             [facility] Medicines:                Monitored Anesthesia Care Complications:            No immediate complications. Estimated Blood Loss:     Estimated blood loss was minimal. Procedure:                Pre-Anesthesia Assessment:                           - Prior to the procedure, a History and Physical                            was performed, and patient medications and                            allergies were reviewed. The patient's tolerance of                            previous anesthesia was also reviewed. The risks                            and benefits of the procedure and the sedation                            options and risks were discussed with the patient.                            All questions were answered, and informed consent                            was obtained. Prior Anticoagulants: The patient has                            taken no previous anticoagulant or antiplatelet                            agents. ASA Grade Assessment: III - A patient with                            severe systemic disease. After reviewing the risks  and benefits, the patient was deemed in                            satisfactory condition to undergo the procedure.                           After obtaining informed consent, the endoscope was                            passed under direct vision. Throughout the                            procedure, the patient's blood pressure, pulse, and                oxygen saturations were monitored continuously. The                            GIF-H190 ([id number]) Olympus gastroscope was                            introduced through the mouth, and advanced to the                            second part of duodenum. The upper GI endoscopy was                            accomplished without difficulty. The patient                            tolerated the procedure well. Scope In: Scope Out: Findings:      LA Grade A (one or more mucosal breaks less than 5 mm, not extending       between tops of 2 mucosal folds) esophagitis with no bleeding was found       at the gastroesophageal junction.      A 1 cm hiatal hernia was present.      Patchy severe inflammation characterized by adherent blood, erosions,       erythema and friability was found in the gastric fundus, in the gastric       antrum and in the prepyloric region of the stomach. Biopsies were taken       with a cold forceps for histology and Helicobacter pylori testing.      Moderate inflammation characterized by erythema was found in the       duodenal bulb.      The second portion of the duodenum was normal. Impression:               - LA Grade A reflux esophagitis.                           - 1 cm hiatal hernia.                           - Gastritis. Biopsied. While likely not source of  large volume hematochezia, certainly a potential                            source of blood loss.                           - Mild duodenitis.                           - Normal second portion of the duodenum. Moderate Sedation:      N/A Recommendation:           - Return patient to hospital ward for ongoing care.                           - Resume previous diet.                           - Continue present medications.                           - BID PPI x 4 weeks, then once daily.                           - Avoid NSAIDs.                           - Await pathology  results. Follow-up H. Pylori                            status (by biopsy) and treat after discharge if                            positive.                           - See colonoscopy. Procedure Code(s):        --- Professional ---                           [phone], Esophagogastroduodenoscopy, flexible,                            transoral; with biopsy, single or multiple Diagnosis Code(s):        --- Professional ---                           K21.0, Gastro-esophageal reflux disease with                            esophagitis                           K44.9, Diaphragmatic hernia without obstruction or                            gangrene  K29.70, Gastritis, unspecified, without bleeding                           K29.80, Duodenitis without bleeding                           K92.1, Melena (includes Hematochezia) CPT copyright 2018 [employer]. All rights reserved. The codes documented in this report are preliminary and upon coder review may  be revised to meet current compliance requirements. Jerene Bears, MD 09/04/2018 10:58:59 AM This report has been signed electronically. Number of Addenda: 0

## 2018-09-04 NOTE — Anesthesia Postprocedure Evaluation (Signed)
Anesthesia Post Note  Patient: Kathleen [name]  Procedure(s) Performed: COLONOSCOPY WITH PROPOFOL (N/A ) POLYPECTOMY BIOPSY     Patient location during evaluation: PACU Anesthesia Type: MAC Level of consciousness: awake and alert Pain management: pain level controlled Vital Signs Assessment: post-procedure vital signs reviewed and stable Respiratory status: spontaneous breathing, nonlabored ventilation, respiratory function stable and patient connected to nasal cannula oxygen Cardiovascular status: stable and blood pressure returned to baseline Postop Assessment: no apparent nausea or vomiting Anesthetic complications: no    Last Vitals:  Vitals:   09/04/18 1100 09/04/18 1110  BP: (!) 102/41 (!) 156/64  Pulse: 70 67  Resp: 19 17  Temp:    SpO2: 100% 100%    Last Pain:  Vitals:   09/04/18 1120  TempSrc:   PainSc: 0-No pain                 Courage Biglow

## 2018-09-04 NOTE — Op Note (Signed)
[facility] Patient Name: Kathleen [name] Procedure Date : 09/04/2018 MRN: [id number] Attending MD: Jerene Bears , MD Date of Birth: 21-Jul-1946 CSN: [id number] Age: 73 Admit Type: Inpatient Procedure:                Colonoscopy Indications:              Hematochezia Providers:                Lajuan Lines. Hilarie Fredrickson, MD, Zenon Mayo, RN, Cherylynn Ridges,                            Technician, Judeth Cornfield, CRNA Referring MD:             [facility] Medicines:                Monitored Anesthesia Care Complications:            No immediate complications. Estimated Blood Loss:     Estimated blood loss: none. Estimated blood loss                            was minimal. Procedure:                Pre-Anesthesia Assessment:                           - Prior to the procedure, a History and Physical                            was performed, and patient medications and                            allergies were reviewed. The patient's tolerance of                            previous anesthesia was also reviewed. The risks                            and benefits of the procedure and the sedation                            options and risks were discussed with the patient.                            All questions were answered, and informed consent                            was obtained. Prior Anticoagulants: The patient has                            taken no previous anticoagulant or antiplatelet                            agents. ASA Grade Assessment: III - A patient with  severe systemic disease. After reviewing the risks                            and benefits, the patient was deemed in                            satisfactory condition to undergo the procedure.                           After obtaining informed consent, the colonoscope                            was passed under direct vision. Throughout the                            procedure, the  patient's blood pressure, pulse, and                            oxygen saturations were monitored continuously. The                            PCF-H190DL ([id number]) Olympus pediatric colonoscope                            was introduced through the anus and advanced to the                            cecum, identified by appendiceal orifice and                            ileocecal valve. The colonoscopy was performed                            without difficulty. The patient tolerated the                            procedure well. The quality of the bowel                            preparation was good. The ileocecal valve,                            appendiceal orifice, and rectum were photographed. Scope In: 10:05:28 AM Scope Out: 10:35:20 AM Scope Withdrawal Time: 0 hours 25 minutes 2 seconds  Total Procedure Duration: 0 hours 29 minutes 52 seconds  Findings:      The digital rectal exam was normal.      A 6 mm polyp was found in the cecum. The polyp was sessile. The polyp       was removed with a cold snare. Resection and retrieval were complete.      Two sessile polyps were found in the ascending colon. The polyps were 3       to 4 mm in size. These polyps were removed with a cold snare. Resection       and retrieval were complete.  A 8 mm polyp was found in the sigmoid colon. The polyp was sessile. The       polyp was removed with a hot snare. Resection and retrieval were       complete.      Many small and large-mouthed diverticula were found from cecum to       sigmoid colon. There was no evidence of diverticular bleeding.      Internal hemorrhoids were found during retroflexion. The hemorrhoids       were medium-sized. Impression:               - One 6 mm polyp in the cecum, removed with a cold                            snare. Resected and retrieved.                           - Two 3 to 4 mm polyps in the ascending colon,                            removed with a cold  snare. Resected and retrieved.                           - One 8 mm polyp in the sigmoid colon, removed with                            a hot snare. Resected and retrieved.                           - Severe diverticulosis from cecum to sigmoid                            colon. There was no evidence of diverticular                            bleeding, though this is most likely source of                            recent/now resolved hematochezia.                           - Internal hemorrhoids.                           - No blood was seen in the examined colon. Moderate Sedation:      N/A Recommendation:           - Return patient to hospital ward for ongoing care.                           - Resume previous diet.                           - Continue present medications.                           -  Await pathology results.                           - Repeat colonoscopy is recommended for                            surveillance. The colonoscopy date will be                            determined after pathology results from today's                            exam become available for review.                           - See EGD report. Procedure Code(s):        --- Professional ---                           [phone], Colonoscopy, flexible; with removal of                            tumor(s), polyp(s), or other lesion(s) by snare                            technique Diagnosis Code(s):        --- Professional ---                           D12.0, Benign neoplasm of cecum                           D12.5, Benign neoplasm of sigmoid colon                           D12.2, Benign neoplasm of ascending colon                           K64.8, Other hemorrhoids                           K92.1, Melena (includes Hematochezia)                           K57.30, Diverticulosis of large intestine without                            perforation or abscess without bleeding CPT copyright 2018 American Medical  Association. All rights reserved. The codes documented in this report are preliminary and upon coder review may  be revised to meet current compliance requirements. Jerene Bears, MD 09/04/2018 10:40:59 AM This report has been signed electronically. Number of Addenda: 0

## 2018-09-04 NOTE — Transfer of Care (Signed)
Immediate Anesthesia Transfer of Care Note  Patient: Kathleen [name]  Procedure(s) Performed: COLONOSCOPY WITH PROPOFOL (N/A ) POLYPECTOMY BIOPSY  Patient Location: PACU and Endoscopy Unit  Anesthesia Type:MAC  Level of Consciousness: patient cooperative and responds to stimulation  Airway & Oxygen Therapy: Patient Spontanous Breathing and Patient connected to nasal cannula oxygen  Post-op Assessment: Report given to RN and Post -op Vital signs reviewed and stable  Post vital signs: Reviewed and stable  Last Vitals:  Vitals Value Taken Time  BP 102/41 09/04/2018 10:58 AM  Temp    Pulse 72 09/04/2018 11:01 AM  Resp 17 09/04/2018 11:01 AM  SpO2 100 % 09/04/2018 11:01 AM  Vitals shown include unvalidated device data.  Last Pain:  Vitals:   09/04/18 1057  TempSrc:   PainSc: 0-No pain         Complications: No apparent anesthesia complications

## 2018-09-06 DIAGNOSIS — N2581 Secondary hyperparathyroidism of renal origin: Secondary | ICD-10-CM | POA: Diagnosis not present

## 2018-09-06 DIAGNOSIS — Z992 Dependence on renal dialysis: Secondary | ICD-10-CM | POA: Diagnosis not present

## 2018-09-06 DIAGNOSIS — N186 End stage renal disease: Secondary | ICD-10-CM | POA: Diagnosis not present

## 2018-09-06 DIAGNOSIS — D631 Anemia in chronic kidney disease: Secondary | ICD-10-CM | POA: Diagnosis not present

## 2018-09-06 DIAGNOSIS — E1129 Type 2 diabetes mellitus with other diabetic kidney complication: Secondary | ICD-10-CM | POA: Diagnosis not present

## 2018-09-07 ENCOUNTER — Encounter (HOSPITAL_COMMUNITY): Payer: Self-pay | Admitting: Internal Medicine

## 2018-09-08 DIAGNOSIS — E1122 Type 2 diabetes mellitus with diabetic chronic kidney disease: Secondary | ICD-10-CM | POA: Diagnosis not present

## 2018-09-08 DIAGNOSIS — Z992 Dependence on renal dialysis: Secondary | ICD-10-CM | POA: Diagnosis not present

## 2018-09-08 DIAGNOSIS — N186 End stage renal disease: Secondary | ICD-10-CM | POA: Diagnosis not present

## 2018-09-10 DIAGNOSIS — D631 Anemia in chronic kidney disease: Secondary | ICD-10-CM | POA: Diagnosis not present

## 2018-09-10 DIAGNOSIS — N2581 Secondary hyperparathyroidism of renal origin: Secondary | ICD-10-CM | POA: Diagnosis not present

## 2018-09-10 DIAGNOSIS — E1129 Type 2 diabetes mellitus with other diabetic kidney complication: Secondary | ICD-10-CM | POA: Diagnosis not present

## 2018-09-10 DIAGNOSIS — Z992 Dependence on renal dialysis: Secondary | ICD-10-CM | POA: Diagnosis not present

## 2018-09-10 DIAGNOSIS — N186 End stage renal disease: Secondary | ICD-10-CM | POA: Diagnosis not present

## 2018-09-10 DIAGNOSIS — D509 Iron deficiency anemia, unspecified: Secondary | ICD-10-CM | POA: Diagnosis not present

## 2018-09-11 ENCOUNTER — Encounter: Payer: Self-pay | Admitting: Internal Medicine

## 2018-09-11 DIAGNOSIS — D631 Anemia in chronic kidney disease: Secondary | ICD-10-CM | POA: Diagnosis not present

## 2018-09-11 DIAGNOSIS — N186 End stage renal disease: Secondary | ICD-10-CM | POA: Diagnosis not present

## 2018-09-11 DIAGNOSIS — D509 Iron deficiency anemia, unspecified: Secondary | ICD-10-CM | POA: Diagnosis not present

## 2018-09-11 DIAGNOSIS — N2581 Secondary hyperparathyroidism of renal origin: Secondary | ICD-10-CM | POA: Diagnosis not present

## 2018-09-11 DIAGNOSIS — E1129 Type 2 diabetes mellitus with other diabetic kidney complication: Secondary | ICD-10-CM | POA: Diagnosis not present

## 2018-09-11 DIAGNOSIS — Z992 Dependence on renal dialysis: Secondary | ICD-10-CM | POA: Diagnosis not present

## 2018-09-13 DIAGNOSIS — D509 Iron deficiency anemia, unspecified: Secondary | ICD-10-CM | POA: Diagnosis not present

## 2018-09-13 DIAGNOSIS — N186 End stage renal disease: Secondary | ICD-10-CM | POA: Diagnosis not present

## 2018-09-13 DIAGNOSIS — Z992 Dependence on renal dialysis: Secondary | ICD-10-CM | POA: Diagnosis not present

## 2018-09-13 DIAGNOSIS — E1129 Type 2 diabetes mellitus with other diabetic kidney complication: Secondary | ICD-10-CM | POA: Diagnosis not present

## 2018-09-13 DIAGNOSIS — N2581 Secondary hyperparathyroidism of renal origin: Secondary | ICD-10-CM | POA: Diagnosis not present

## 2018-09-13 DIAGNOSIS — D631 Anemia in chronic kidney disease: Secondary | ICD-10-CM | POA: Diagnosis not present

## 2018-09-15 DIAGNOSIS — D631 Anemia in chronic kidney disease: Secondary | ICD-10-CM | POA: Diagnosis not present

## 2018-09-15 DIAGNOSIS — N2581 Secondary hyperparathyroidism of renal origin: Secondary | ICD-10-CM | POA: Diagnosis not present

## 2018-09-15 DIAGNOSIS — N186 End stage renal disease: Secondary | ICD-10-CM | POA: Diagnosis not present

## 2018-09-15 DIAGNOSIS — E1129 Type 2 diabetes mellitus with other diabetic kidney complication: Secondary | ICD-10-CM | POA: Diagnosis not present

## 2018-09-15 DIAGNOSIS — Z992 Dependence on renal dialysis: Secondary | ICD-10-CM | POA: Diagnosis not present

## 2018-09-15 DIAGNOSIS — D509 Iron deficiency anemia, unspecified: Secondary | ICD-10-CM | POA: Diagnosis not present

## 2018-09-18 DIAGNOSIS — D509 Iron deficiency anemia, unspecified: Secondary | ICD-10-CM | POA: Diagnosis not present

## 2018-09-18 DIAGNOSIS — Z992 Dependence on renal dialysis: Secondary | ICD-10-CM | POA: Diagnosis not present

## 2018-09-18 DIAGNOSIS — N2581 Secondary hyperparathyroidism of renal origin: Secondary | ICD-10-CM | POA: Diagnosis not present

## 2018-09-18 DIAGNOSIS — E1129 Type 2 diabetes mellitus with other diabetic kidney complication: Secondary | ICD-10-CM | POA: Diagnosis not present

## 2018-09-18 DIAGNOSIS — N186 End stage renal disease: Secondary | ICD-10-CM | POA: Diagnosis not present

## 2018-09-18 DIAGNOSIS — D631 Anemia in chronic kidney disease: Secondary | ICD-10-CM | POA: Diagnosis not present

## 2018-09-20 DIAGNOSIS — N186 End stage renal disease: Secondary | ICD-10-CM | POA: Diagnosis not present

## 2018-09-20 DIAGNOSIS — D631 Anemia in chronic kidney disease: Secondary | ICD-10-CM | POA: Diagnosis not present

## 2018-09-20 DIAGNOSIS — N2581 Secondary hyperparathyroidism of renal origin: Secondary | ICD-10-CM | POA: Diagnosis not present

## 2018-09-20 DIAGNOSIS — E1129 Type 2 diabetes mellitus with other diabetic kidney complication: Secondary | ICD-10-CM | POA: Diagnosis not present

## 2018-09-20 DIAGNOSIS — Z992 Dependence on renal dialysis: Secondary | ICD-10-CM | POA: Diagnosis not present

## 2018-09-20 DIAGNOSIS — D509 Iron deficiency anemia, unspecified: Secondary | ICD-10-CM | POA: Diagnosis not present

## 2018-09-21 ENCOUNTER — Ambulatory Visit (INDEPENDENT_AMBULATORY_CARE_PROVIDER_SITE_OTHER): Payer: Medicare Other | Admitting: Nurse Practitioner

## 2018-09-21 ENCOUNTER — Encounter: Payer: Self-pay | Admitting: Nurse Practitioner

## 2018-09-21 VITALS — BP 118/76 | HR 68 | Ht 63.0 in | Wt 190.0 lb

## 2018-09-21 DIAGNOSIS — K922 Gastrointestinal hemorrhage, unspecified: Secondary | ICD-10-CM | POA: Diagnosis not present

## 2018-09-21 NOTE — Progress Notes (Addendum)
Chief Complaint:    Hospital follow-up for GI bleed  IMPRESSION and PLAN:    74.  73 year old female with both medical problems including end-stage renal disease on HD.  Patient admitted to the hospital late January with lower GI bleed.  Inpatient EGD and colonoscopy showed no active bleeding.  She was found to have severe diverticulosis from the cecum to the sigmoid colon. Bleeding was felt to likely be a diverticular hemorrhage.  She remained hemodynamically stable, no blood transfusion was required -No recurrent bleeding.  Patient feels okay, offers no GI or general medical complaints.  Patient will contact us or go to the ED in the event she ever has any recurrent bleeding.  She is aware that diverticular bleed scan recur.   2.  Mild esophagitis/gastritis and duodenitis.  Biopsies compatible with reactive gastropathy, no H. Pylori. -Patient will complete 2 additional weeks of twice daily PPI then decrease to once daily      Addendum: Reviewed and agree with assessment and management plan. Pyrtle, Lajuan Lines, MD    HPI:     Patient is a 73 year old female with multiple medical problems not limited to end-stage renal disease on HD Tu/Th/Sat, CAD, diabetes, and hypertension.  She was hospitalized late January with lower GI bleed.  Records reviewed in Epic. Bleeding started a few days prior to patient presenting to the ED. She was seen in consultation by Carol Ada, MD who wascovering our service for the weekend.  Patient not require blood transfusion.  Hemoglobin stayed at baseline around 11.2.  She underwent inpatient EGD and colonoscopy by Dr. Hilarie Fredrickson with findings as below. She is here for hospital follow-up.  No recurrent bleeding. She takes MiraLAX as needed.  She is still taking twice daily Protonix and will continue that for the next 2 weeks then decrease to once daily.  Patient has no complaints   EGD 09/04/18  Mild esophagitis 1 cm hiatal hernia. - Gastritis. Biopsied. While  likely not source of large volume hematochezia, certainly a potential source of blood loss. - Mild duodenitis. - Normal second portion of the duodenum.  Colonoscopy 09/04/18 one 6 mm polyp in the cecum, removed with a cold snare. Resected and retrieved. - Two 3 to 4 mm polyps in the ascending colon, removed with a cold snare. Resected and retrieved. - One 8 mm polyp in the sigmoid colon, removed with a hot snare. Resected and retrieved. - Severe diverticulosis from cecum to sigmoid colon. There was no evidence of diverticular bleeding, though this is most likely source of recent/now resolved hematochezia. - Internal hemorrhoids. - No blood was seen in the examined colon.  *Polyps were tubular adenomas without high-grade dysplasia.  Gastric biopsies showed reactive gastropathy negative for H. Pylori   Review of systems:     No chest pain, no SOB, no fevers, no urinary sx   Past Medical History:  Diagnosis Date  . Anemia    a. 01/2014: suspected of chronic disease, negative FOBT.  [name]    knees  . Asthma    many years ago  . CAD (coronary artery disease)    a. Cath 01/2014: mild in LAD, mild-mod LCx, mod-severe RCA - for med rx.  . Diabetes mellitus without complication ([place])   . ESRD (end stage renal disease) ([place])    [place]  . History of echocardiogram    Echo (10/15):  Mod LVH, EF 50-55%, Gr 1 DD, MAC, mild MR, mild TR, PASP  35 mmHg  . Hx of transfusion   . Hyperlipidemia   . Hypertension   . Meningioma ([place])    a. Incidental dx 01/2014.  . Obesity   . Renal insufficiency   . Systolic CHF ([place])    a. 03/5276: dx with mixed ICM/NICM (out of proportion to CAD) EF 25-30% by echo.     Patient's surgical history, family medical history, social history, medications and allergies were all reviewed in Epic   Serum creatinine: 4.78 mg/dL (H) 09/04/18 8242 Estimated creatinine clearance: 10.9 mL/min (A)  Current Outpatient Medications  Medication Sig  Dispense Refill  . acetaminophen (TYLENOL) 325 MG tablet Take 2 tablets (650 mg total) by mouth every 4 (four) hours as needed for mild pain (or temp > 37.5 C (99.5 F)).    [name] aspirin 81 MG EC tablet Take 4 tablets (325 mg total) by mouth daily. (Patient taking differently: Take 162 mg by mouth daily. ) 30 tablet 3  . atorvastatin (LIPITOR) 40 MG tablet Take 1 tablet (40 mg total) by mouth daily at 6 PM. 30 tablet 0  . brimonidine-timolol (COMBIGAN) 0.2-0.5 % ophthalmic solution Place 1 drop into both eyes every 12 (twelve) hours.     . calcitRIOL (ROCALTROL) 0.5 MCG capsule Take 1 capsule (0.5 mcg total) by mouth Every Tuesday,Thursday,and Saturday with dialysis.    [name] calcium acetate (PHOSLO) 667 MG capsule Take 1 capsule (667 mg total) by mouth as needed (with snacks). (Patient taking differently: Take 667-2,001 mg by mouth See admin instructions. Take 3 capsules (2050m) with a meal three times a day. Take 667 mg with snack) 60 capsule 0  . carvedilol (COREG) 25 MG tablet Take 25 mg by mouth 2 (two) times daily.  3  . gabapentin (NEURONTIN) 100 MG capsule Take 100 mg by mouth at bedtime.  3  . hydrALAZINE (APRESOLINE) 25 MG tablet Take 1 tablet (25 mg total) by mouth every 8 (eight) hours as needed (SBP > 170  or DBP > 110). (Patient taking differently: Take 50 mg by mouth every 8 (eight) hours as needed (SBP > 170  or DBP > 110). ) 90 tablet 0  . insulin detemir (LEVEMIR) 100 UNIT/ML injection Inject 0.15 mLs (15 Units total) into the skin at bedtime. (Patient taking differently: Inject 15 Units into the skin at bedtime. ) 10 mL 11  . latanoprost (XALATAN) 0.005 % ophthalmic solution Place 1 drop into both eyes every evening.    . Multiple Vitamins-Minerals (RENAPLEX) TABS Take 1 tablet by mouth daily.    . multivitamin (RENA-VIT) TABS tablet Take 1 tablet by mouth at bedtime. 30 tablet 0  . pantoprazole (PROTONIX) 40 MG tablet Take 1 tablet (40 mg total) by mouth 2 (two) times daily before a meal.  Take 1 tablet twice daily for a  Month then once dailly 90 tablet 1  . polyethylene glycol (MIRALAX / GLYCOLAX) packet Take 17 g by mouth daily. (Patient taking differently: Take 17 g by mouth daily as needed for mild constipation. ) 14 each 0   No current facility-administered medications for this visit.     Physical Exam:     BP 118/76   Pulse 68   Ht _0  (1.6 m)   Wt 190 lb (86.2 kg)   LMP  (LMP Unknown)   BMI 33.66 kg/m   GENERAL:  Pleasant female in wheelchair in NAD PSYCH: : Cooperative, normal affect EENT:  conjunctiva pink, mucous membranes moist, neck supple without masses CARDIAC:  RRR, soft murmur heard, no peripheral edema PULM: Normal respiratory effort, lungs CTA bilaterally, no wheezing ABDOMEN: Limited exam in wheelchair but abdomen soft, nontender, normal bowel sounds Musculoskeletal:  Normal muscle tone, normal strength NEURO: Alert and oriented x 3, no focal neurologic deficits   Tye Savoy , NP 09/21/2018, 10:24 AM

## 2018-09-21 NOTE — Patient Instructions (Addendum)
If you are age 74 or older, your body mass index should be between 23-30. Your Body mass index is 33.66 kg/m. If this is out of the aforementioned range listed, please consider follow up with your Primary Care Provider.  If you are age 71 or younger, your body mass index should be between 19-25. Your Body mass index is 33.66 kg/m. If this is out of the aformentioned range listed, please consider follow up with your Primary Care Provider.   Continue Pantoprazole 40mg  twice daily for 2 weeks then take daily thereafter.   Please call the office back if you have anymore rectal bleeding. Otherwise, follow up as needed.  Thank you.

## 2018-09-22 DIAGNOSIS — Z992 Dependence on renal dialysis: Secondary | ICD-10-CM | POA: Diagnosis not present

## 2018-09-22 DIAGNOSIS — E1129 Type 2 diabetes mellitus with other diabetic kidney complication: Secondary | ICD-10-CM | POA: Diagnosis not present

## 2018-09-22 DIAGNOSIS — D509 Iron deficiency anemia, unspecified: Secondary | ICD-10-CM | POA: Diagnosis not present

## 2018-09-22 DIAGNOSIS — N186 End stage renal disease: Secondary | ICD-10-CM | POA: Diagnosis not present

## 2018-09-22 DIAGNOSIS — N2581 Secondary hyperparathyroidism of renal origin: Secondary | ICD-10-CM | POA: Diagnosis not present

## 2018-09-22 DIAGNOSIS — D631 Anemia in chronic kidney disease: Secondary | ICD-10-CM | POA: Diagnosis not present

## 2018-09-24 DIAGNOSIS — N186 End stage renal disease: Secondary | ICD-10-CM | POA: Diagnosis not present

## 2018-09-24 DIAGNOSIS — Z720 Tobacco use: Secondary | ICD-10-CM | POA: Diagnosis not present

## 2018-09-24 DIAGNOSIS — E785 Hyperlipidemia, unspecified: Secondary | ICD-10-CM | POA: Diagnosis not present

## 2018-09-24 DIAGNOSIS — I10 Essential (primary) hypertension: Secondary | ICD-10-CM | POA: Diagnosis not present

## 2018-09-24 DIAGNOSIS — Z992 Dependence on renal dialysis: Secondary | ICD-10-CM | POA: Diagnosis not present

## 2018-09-24 DIAGNOSIS — E119 Type 2 diabetes mellitus without complications: Secondary | ICD-10-CM | POA: Diagnosis not present

## 2018-09-24 DIAGNOSIS — R51 Headache: Secondary | ICD-10-CM | POA: Diagnosis not present

## 2018-09-25 DIAGNOSIS — E1129 Type 2 diabetes mellitus with other diabetic kidney complication: Secondary | ICD-10-CM | POA: Diagnosis not present

## 2018-09-25 DIAGNOSIS — D509 Iron deficiency anemia, unspecified: Secondary | ICD-10-CM | POA: Diagnosis not present

## 2018-09-25 DIAGNOSIS — Z992 Dependence on renal dialysis: Secondary | ICD-10-CM | POA: Diagnosis not present

## 2018-09-25 DIAGNOSIS — N2581 Secondary hyperparathyroidism of renal origin: Secondary | ICD-10-CM | POA: Diagnosis not present

## 2018-09-25 DIAGNOSIS — D631 Anemia in chronic kidney disease: Secondary | ICD-10-CM | POA: Diagnosis not present

## 2018-09-25 DIAGNOSIS — N186 End stage renal disease: Secondary | ICD-10-CM | POA: Diagnosis not present

## 2018-09-27 DIAGNOSIS — N2581 Secondary hyperparathyroidism of renal origin: Secondary | ICD-10-CM | POA: Diagnosis not present

## 2018-09-27 DIAGNOSIS — D631 Anemia in chronic kidney disease: Secondary | ICD-10-CM | POA: Diagnosis not present

## 2018-09-27 DIAGNOSIS — E1129 Type 2 diabetes mellitus with other diabetic kidney complication: Secondary | ICD-10-CM | POA: Diagnosis not present

## 2018-09-27 DIAGNOSIS — D509 Iron deficiency anemia, unspecified: Secondary | ICD-10-CM | POA: Diagnosis not present

## 2018-09-27 DIAGNOSIS — N186 End stage renal disease: Secondary | ICD-10-CM | POA: Diagnosis not present

## 2018-09-27 DIAGNOSIS — Z992 Dependence on renal dialysis: Secondary | ICD-10-CM | POA: Diagnosis not present

## 2018-10-01 DIAGNOSIS — D631 Anemia in chronic kidney disease: Secondary | ICD-10-CM | POA: Diagnosis not present

## 2018-10-01 DIAGNOSIS — N2581 Secondary hyperparathyroidism of renal origin: Secondary | ICD-10-CM | POA: Diagnosis not present

## 2018-10-01 DIAGNOSIS — N186 End stage renal disease: Secondary | ICD-10-CM | POA: Diagnosis not present

## 2018-10-01 DIAGNOSIS — D509 Iron deficiency anemia, unspecified: Secondary | ICD-10-CM | POA: Diagnosis not present

## 2018-10-01 DIAGNOSIS — Z992 Dependence on renal dialysis: Secondary | ICD-10-CM | POA: Diagnosis not present

## 2018-10-01 DIAGNOSIS — E1129 Type 2 diabetes mellitus with other diabetic kidney complication: Secondary | ICD-10-CM | POA: Diagnosis not present

## 2018-10-02 DIAGNOSIS — Z992 Dependence on renal dialysis: Secondary | ICD-10-CM | POA: Diagnosis not present

## 2018-10-02 DIAGNOSIS — E1129 Type 2 diabetes mellitus with other diabetic kidney complication: Secondary | ICD-10-CM | POA: Diagnosis not present

## 2018-10-02 DIAGNOSIS — D631 Anemia in chronic kidney disease: Secondary | ICD-10-CM | POA: Diagnosis not present

## 2018-10-02 DIAGNOSIS — N2581 Secondary hyperparathyroidism of renal origin: Secondary | ICD-10-CM | POA: Diagnosis not present

## 2018-10-02 DIAGNOSIS — D509 Iron deficiency anemia, unspecified: Secondary | ICD-10-CM | POA: Diagnosis not present

## 2018-10-02 DIAGNOSIS — N186 End stage renal disease: Secondary | ICD-10-CM | POA: Diagnosis not present

## 2018-10-04 DIAGNOSIS — N186 End stage renal disease: Secondary | ICD-10-CM | POA: Diagnosis not present

## 2018-10-04 DIAGNOSIS — E1129 Type 2 diabetes mellitus with other diabetic kidney complication: Secondary | ICD-10-CM | POA: Diagnosis not present

## 2018-10-04 DIAGNOSIS — D631 Anemia in chronic kidney disease: Secondary | ICD-10-CM | POA: Diagnosis not present

## 2018-10-04 DIAGNOSIS — Z992 Dependence on renal dialysis: Secondary | ICD-10-CM | POA: Diagnosis not present

## 2018-10-04 DIAGNOSIS — N2581 Secondary hyperparathyroidism of renal origin: Secondary | ICD-10-CM | POA: Diagnosis not present

## 2018-10-04 DIAGNOSIS — D509 Iron deficiency anemia, unspecified: Secondary | ICD-10-CM | POA: Diagnosis not present

## 2018-10-07 DIAGNOSIS — Z992 Dependence on renal dialysis: Secondary | ICD-10-CM | POA: Diagnosis not present

## 2018-10-07 DIAGNOSIS — E1122 Type 2 diabetes mellitus with diabetic chronic kidney disease: Secondary | ICD-10-CM | POA: Diagnosis not present

## 2018-10-07 DIAGNOSIS — N186 End stage renal disease: Secondary | ICD-10-CM | POA: Diagnosis not present

## 2018-10-09 DIAGNOSIS — N2581 Secondary hyperparathyroidism of renal origin: Secondary | ICD-10-CM | POA: Diagnosis not present

## 2018-10-09 DIAGNOSIS — D631 Anemia in chronic kidney disease: Secondary | ICD-10-CM | POA: Diagnosis not present

## 2018-10-09 DIAGNOSIS — D509 Iron deficiency anemia, unspecified: Secondary | ICD-10-CM | POA: Diagnosis not present

## 2018-10-09 DIAGNOSIS — N186 End stage renal disease: Secondary | ICD-10-CM | POA: Diagnosis not present

## 2018-10-09 DIAGNOSIS — Z992 Dependence on renal dialysis: Secondary | ICD-10-CM | POA: Diagnosis not present

## 2018-10-09 DIAGNOSIS — E1129 Type 2 diabetes mellitus with other diabetic kidney complication: Secondary | ICD-10-CM | POA: Diagnosis not present

## 2018-10-12 DIAGNOSIS — T82858A Stenosis of vascular prosthetic devices, implants and grafts, initial encounter: Secondary | ICD-10-CM | POA: Diagnosis not present

## 2018-10-12 DIAGNOSIS — N186 End stage renal disease: Secondary | ICD-10-CM | POA: Diagnosis not present

## 2018-10-12 DIAGNOSIS — I871 Compression of vein: Secondary | ICD-10-CM | POA: Diagnosis not present

## 2018-10-12 DIAGNOSIS — Z992 Dependence on renal dialysis: Secondary | ICD-10-CM | POA: Diagnosis not present

## 2018-10-13 DIAGNOSIS — Z992 Dependence on renal dialysis: Secondary | ICD-10-CM | POA: Diagnosis not present

## 2018-10-13 DIAGNOSIS — E1129 Type 2 diabetes mellitus with other diabetic kidney complication: Secondary | ICD-10-CM | POA: Diagnosis not present

## 2018-10-13 DIAGNOSIS — N186 End stage renal disease: Secondary | ICD-10-CM | POA: Diagnosis not present

## 2018-10-13 DIAGNOSIS — N2581 Secondary hyperparathyroidism of renal origin: Secondary | ICD-10-CM | POA: Diagnosis not present

## 2018-10-13 DIAGNOSIS — D631 Anemia in chronic kidney disease: Secondary | ICD-10-CM | POA: Diagnosis not present

## 2018-10-13 DIAGNOSIS — D509 Iron deficiency anemia, unspecified: Secondary | ICD-10-CM | POA: Diagnosis not present

## 2018-10-16 DIAGNOSIS — D509 Iron deficiency anemia, unspecified: Secondary | ICD-10-CM | POA: Diagnosis not present

## 2018-10-16 DIAGNOSIS — N186 End stage renal disease: Secondary | ICD-10-CM | POA: Diagnosis not present

## 2018-10-16 DIAGNOSIS — D631 Anemia in chronic kidney disease: Secondary | ICD-10-CM | POA: Diagnosis not present

## 2018-10-16 DIAGNOSIS — E1129 Type 2 diabetes mellitus with other diabetic kidney complication: Secondary | ICD-10-CM | POA: Diagnosis not present

## 2018-10-16 DIAGNOSIS — N2581 Secondary hyperparathyroidism of renal origin: Secondary | ICD-10-CM | POA: Diagnosis not present

## 2018-10-16 DIAGNOSIS — Z992 Dependence on renal dialysis: Secondary | ICD-10-CM | POA: Diagnosis not present

## 2018-10-18 DIAGNOSIS — Z992 Dependence on renal dialysis: Secondary | ICD-10-CM | POA: Diagnosis not present

## 2018-10-18 DIAGNOSIS — D631 Anemia in chronic kidney disease: Secondary | ICD-10-CM | POA: Diagnosis not present

## 2018-10-18 DIAGNOSIS — N2581 Secondary hyperparathyroidism of renal origin: Secondary | ICD-10-CM | POA: Diagnosis not present

## 2018-10-18 DIAGNOSIS — D509 Iron deficiency anemia, unspecified: Secondary | ICD-10-CM | POA: Diagnosis not present

## 2018-10-18 DIAGNOSIS — E1129 Type 2 diabetes mellitus with other diabetic kidney complication: Secondary | ICD-10-CM | POA: Diagnosis not present

## 2018-10-18 DIAGNOSIS — N186 End stage renal disease: Secondary | ICD-10-CM | POA: Diagnosis not present

## 2018-10-20 DIAGNOSIS — D631 Anemia in chronic kidney disease: Secondary | ICD-10-CM | POA: Diagnosis not present

## 2018-10-20 DIAGNOSIS — Z992 Dependence on renal dialysis: Secondary | ICD-10-CM | POA: Diagnosis not present

## 2018-10-20 DIAGNOSIS — N2581 Secondary hyperparathyroidism of renal origin: Secondary | ICD-10-CM | POA: Diagnosis not present

## 2018-10-20 DIAGNOSIS — E1129 Type 2 diabetes mellitus with other diabetic kidney complication: Secondary | ICD-10-CM | POA: Diagnosis not present

## 2018-10-20 DIAGNOSIS — N186 End stage renal disease: Secondary | ICD-10-CM | POA: Diagnosis not present

## 2018-10-20 DIAGNOSIS — D509 Iron deficiency anemia, unspecified: Secondary | ICD-10-CM | POA: Diagnosis not present

## 2018-10-23 DIAGNOSIS — D631 Anemia in chronic kidney disease: Secondary | ICD-10-CM | POA: Diagnosis not present

## 2018-10-23 DIAGNOSIS — D509 Iron deficiency anemia, unspecified: Secondary | ICD-10-CM | POA: Diagnosis not present

## 2018-10-23 DIAGNOSIS — N2581 Secondary hyperparathyroidism of renal origin: Secondary | ICD-10-CM | POA: Diagnosis not present

## 2018-10-23 DIAGNOSIS — E1129 Type 2 diabetes mellitus with other diabetic kidney complication: Secondary | ICD-10-CM | POA: Diagnosis not present

## 2018-10-23 DIAGNOSIS — N186 End stage renal disease: Secondary | ICD-10-CM | POA: Diagnosis not present

## 2018-10-23 DIAGNOSIS — Z992 Dependence on renal dialysis: Secondary | ICD-10-CM | POA: Diagnosis not present

## 2018-10-27 DIAGNOSIS — D509 Iron deficiency anemia, unspecified: Secondary | ICD-10-CM | POA: Diagnosis not present

## 2018-10-27 DIAGNOSIS — D631 Anemia in chronic kidney disease: Secondary | ICD-10-CM | POA: Diagnosis not present

## 2018-10-27 DIAGNOSIS — N186 End stage renal disease: Secondary | ICD-10-CM | POA: Diagnosis not present

## 2018-10-27 DIAGNOSIS — Z992 Dependence on renal dialysis: Secondary | ICD-10-CM | POA: Diagnosis not present

## 2018-10-27 DIAGNOSIS — E1129 Type 2 diabetes mellitus with other diabetic kidney complication: Secondary | ICD-10-CM | POA: Diagnosis not present

## 2018-10-27 DIAGNOSIS — N2581 Secondary hyperparathyroidism of renal origin: Secondary | ICD-10-CM | POA: Diagnosis not present

## 2018-11-03 DIAGNOSIS — N2581 Secondary hyperparathyroidism of renal origin: Secondary | ICD-10-CM | POA: Diagnosis not present

## 2018-11-03 DIAGNOSIS — N186 End stage renal disease: Secondary | ICD-10-CM | POA: Diagnosis not present

## 2018-11-03 DIAGNOSIS — D509 Iron deficiency anemia, unspecified: Secondary | ICD-10-CM | POA: Diagnosis not present

## 2018-11-03 DIAGNOSIS — Z992 Dependence on renal dialysis: Secondary | ICD-10-CM | POA: Diagnosis not present

## 2018-11-03 DIAGNOSIS — D631 Anemia in chronic kidney disease: Secondary | ICD-10-CM | POA: Diagnosis not present

## 2018-11-03 DIAGNOSIS — E1129 Type 2 diabetes mellitus with other diabetic kidney complication: Secondary | ICD-10-CM | POA: Diagnosis not present

## 2018-11-06 DIAGNOSIS — Z992 Dependence on renal dialysis: Secondary | ICD-10-CM | POA: Diagnosis not present

## 2018-11-06 DIAGNOSIS — D631 Anemia in chronic kidney disease: Secondary | ICD-10-CM | POA: Diagnosis not present

## 2018-11-06 DIAGNOSIS — D509 Iron deficiency anemia, unspecified: Secondary | ICD-10-CM | POA: Diagnosis not present

## 2018-11-06 DIAGNOSIS — E1129 Type 2 diabetes mellitus with other diabetic kidney complication: Secondary | ICD-10-CM | POA: Diagnosis not present

## 2018-11-06 DIAGNOSIS — N2581 Secondary hyperparathyroidism of renal origin: Secondary | ICD-10-CM | POA: Diagnosis not present

## 2018-11-06 DIAGNOSIS — N186 End stage renal disease: Secondary | ICD-10-CM | POA: Diagnosis not present

## 2018-11-07 DIAGNOSIS — Z992 Dependence on renal dialysis: Secondary | ICD-10-CM | POA: Diagnosis not present

## 2018-11-07 DIAGNOSIS — N186 End stage renal disease: Secondary | ICD-10-CM | POA: Diagnosis not present

## 2018-11-07 DIAGNOSIS — E1122 Type 2 diabetes mellitus with diabetic chronic kidney disease: Secondary | ICD-10-CM | POA: Diagnosis not present

## 2018-11-08 DIAGNOSIS — E1129 Type 2 diabetes mellitus with other diabetic kidney complication: Secondary | ICD-10-CM | POA: Diagnosis not present

## 2018-11-08 DIAGNOSIS — N2581 Secondary hyperparathyroidism of renal origin: Secondary | ICD-10-CM | POA: Diagnosis not present

## 2018-11-08 DIAGNOSIS — D509 Iron deficiency anemia, unspecified: Secondary | ICD-10-CM | POA: Diagnosis not present

## 2018-11-08 DIAGNOSIS — Z992 Dependence on renal dialysis: Secondary | ICD-10-CM | POA: Diagnosis not present

## 2018-11-08 DIAGNOSIS — D631 Anemia in chronic kidney disease: Secondary | ICD-10-CM | POA: Diagnosis not present

## 2018-11-08 DIAGNOSIS — N186 End stage renal disease: Secondary | ICD-10-CM | POA: Diagnosis not present

## 2018-11-10 DIAGNOSIS — N2581 Secondary hyperparathyroidism of renal origin: Secondary | ICD-10-CM | POA: Diagnosis not present

## 2018-11-10 DIAGNOSIS — D509 Iron deficiency anemia, unspecified: Secondary | ICD-10-CM | POA: Diagnosis not present

## 2018-11-10 DIAGNOSIS — D631 Anemia in chronic kidney disease: Secondary | ICD-10-CM | POA: Diagnosis not present

## 2018-11-10 DIAGNOSIS — Z992 Dependence on renal dialysis: Secondary | ICD-10-CM | POA: Diagnosis not present

## 2018-11-10 DIAGNOSIS — E1129 Type 2 diabetes mellitus with other diabetic kidney complication: Secondary | ICD-10-CM | POA: Diagnosis not present

## 2018-11-10 DIAGNOSIS — N186 End stage renal disease: Secondary | ICD-10-CM | POA: Diagnosis not present

## 2018-11-13 DIAGNOSIS — N2581 Secondary hyperparathyroidism of renal origin: Secondary | ICD-10-CM | POA: Diagnosis not present

## 2018-11-13 DIAGNOSIS — Z992 Dependence on renal dialysis: Secondary | ICD-10-CM | POA: Diagnosis not present

## 2018-11-13 DIAGNOSIS — E1129 Type 2 diabetes mellitus with other diabetic kidney complication: Secondary | ICD-10-CM | POA: Diagnosis not present

## 2018-11-13 DIAGNOSIS — D509 Iron deficiency anemia, unspecified: Secondary | ICD-10-CM | POA: Diagnosis not present

## 2018-11-13 DIAGNOSIS — N186 End stage renal disease: Secondary | ICD-10-CM | POA: Diagnosis not present

## 2018-11-13 DIAGNOSIS — D631 Anemia in chronic kidney disease: Secondary | ICD-10-CM | POA: Diagnosis not present

## 2018-11-15 DIAGNOSIS — N186 End stage renal disease: Secondary | ICD-10-CM | POA: Diagnosis not present

## 2018-11-15 DIAGNOSIS — Z992 Dependence on renal dialysis: Secondary | ICD-10-CM | POA: Diagnosis not present

## 2018-11-15 DIAGNOSIS — D509 Iron deficiency anemia, unspecified: Secondary | ICD-10-CM | POA: Diagnosis not present

## 2018-11-15 DIAGNOSIS — E1129 Type 2 diabetes mellitus with other diabetic kidney complication: Secondary | ICD-10-CM | POA: Diagnosis not present

## 2018-11-15 DIAGNOSIS — N2581 Secondary hyperparathyroidism of renal origin: Secondary | ICD-10-CM | POA: Diagnosis not present

## 2018-11-15 DIAGNOSIS — D631 Anemia in chronic kidney disease: Secondary | ICD-10-CM | POA: Diagnosis not present

## 2018-11-17 DIAGNOSIS — D631 Anemia in chronic kidney disease: Secondary | ICD-10-CM | POA: Diagnosis not present

## 2018-11-17 DIAGNOSIS — D509 Iron deficiency anemia, unspecified: Secondary | ICD-10-CM | POA: Diagnosis not present

## 2018-11-17 DIAGNOSIS — N186 End stage renal disease: Secondary | ICD-10-CM | POA: Diagnosis not present

## 2018-11-17 DIAGNOSIS — N2581 Secondary hyperparathyroidism of renal origin: Secondary | ICD-10-CM | POA: Diagnosis not present

## 2018-11-17 DIAGNOSIS — Z992 Dependence on renal dialysis: Secondary | ICD-10-CM | POA: Diagnosis not present

## 2018-11-17 DIAGNOSIS — E1129 Type 2 diabetes mellitus with other diabetic kidney complication: Secondary | ICD-10-CM | POA: Diagnosis not present

## 2018-11-20 DIAGNOSIS — N2581 Secondary hyperparathyroidism of renal origin: Secondary | ICD-10-CM | POA: Diagnosis not present

## 2018-11-20 DIAGNOSIS — N186 End stage renal disease: Secondary | ICD-10-CM | POA: Diagnosis not present

## 2018-11-20 DIAGNOSIS — D509 Iron deficiency anemia, unspecified: Secondary | ICD-10-CM | POA: Diagnosis not present

## 2018-11-20 DIAGNOSIS — D631 Anemia in chronic kidney disease: Secondary | ICD-10-CM | POA: Diagnosis not present

## 2018-11-20 DIAGNOSIS — Z992 Dependence on renal dialysis: Secondary | ICD-10-CM | POA: Diagnosis not present

## 2018-11-20 DIAGNOSIS — E1129 Type 2 diabetes mellitus with other diabetic kidney complication: Secondary | ICD-10-CM | POA: Diagnosis not present

## 2018-11-22 DIAGNOSIS — E1129 Type 2 diabetes mellitus with other diabetic kidney complication: Secondary | ICD-10-CM | POA: Diagnosis not present

## 2018-11-22 DIAGNOSIS — N2581 Secondary hyperparathyroidism of renal origin: Secondary | ICD-10-CM | POA: Diagnosis not present

## 2018-11-22 DIAGNOSIS — D631 Anemia in chronic kidney disease: Secondary | ICD-10-CM | POA: Diagnosis not present

## 2018-11-22 DIAGNOSIS — D509 Iron deficiency anemia, unspecified: Secondary | ICD-10-CM | POA: Diagnosis not present

## 2018-11-22 DIAGNOSIS — Z992 Dependence on renal dialysis: Secondary | ICD-10-CM | POA: Diagnosis not present

## 2018-11-22 DIAGNOSIS — N186 End stage renal disease: Secondary | ICD-10-CM | POA: Diagnosis not present

## 2018-11-24 DIAGNOSIS — D509 Iron deficiency anemia, unspecified: Secondary | ICD-10-CM | POA: Diagnosis not present

## 2018-11-24 DIAGNOSIS — D631 Anemia in chronic kidney disease: Secondary | ICD-10-CM | POA: Diagnosis not present

## 2018-11-24 DIAGNOSIS — E1129 Type 2 diabetes mellitus with other diabetic kidney complication: Secondary | ICD-10-CM | POA: Diagnosis not present

## 2018-11-24 DIAGNOSIS — N2581 Secondary hyperparathyroidism of renal origin: Secondary | ICD-10-CM | POA: Diagnosis not present

## 2018-11-24 DIAGNOSIS — Z992 Dependence on renal dialysis: Secondary | ICD-10-CM | POA: Diagnosis not present

## 2018-11-24 DIAGNOSIS — N186 End stage renal disease: Secondary | ICD-10-CM | POA: Diagnosis not present

## 2018-11-27 DIAGNOSIS — N2581 Secondary hyperparathyroidism of renal origin: Secondary | ICD-10-CM | POA: Diagnosis not present

## 2018-11-27 DIAGNOSIS — E1129 Type 2 diabetes mellitus with other diabetic kidney complication: Secondary | ICD-10-CM | POA: Diagnosis not present

## 2018-11-27 DIAGNOSIS — D631 Anemia in chronic kidney disease: Secondary | ICD-10-CM | POA: Diagnosis not present

## 2018-11-27 DIAGNOSIS — N186 End stage renal disease: Secondary | ICD-10-CM | POA: Diagnosis not present

## 2018-11-27 DIAGNOSIS — D509 Iron deficiency anemia, unspecified: Secondary | ICD-10-CM | POA: Diagnosis not present

## 2018-11-27 DIAGNOSIS — Z992 Dependence on renal dialysis: Secondary | ICD-10-CM | POA: Diagnosis not present

## 2018-11-29 DIAGNOSIS — N186 End stage renal disease: Secondary | ICD-10-CM | POA: Diagnosis not present

## 2018-11-29 DIAGNOSIS — D631 Anemia in chronic kidney disease: Secondary | ICD-10-CM | POA: Diagnosis not present

## 2018-11-29 DIAGNOSIS — E1129 Type 2 diabetes mellitus with other diabetic kidney complication: Secondary | ICD-10-CM | POA: Diagnosis not present

## 2018-11-29 DIAGNOSIS — N2581 Secondary hyperparathyroidism of renal origin: Secondary | ICD-10-CM | POA: Diagnosis not present

## 2018-11-29 DIAGNOSIS — Z992 Dependence on renal dialysis: Secondary | ICD-10-CM | POA: Diagnosis not present

## 2018-11-29 DIAGNOSIS — D509 Iron deficiency anemia, unspecified: Secondary | ICD-10-CM | POA: Diagnosis not present

## 2018-12-01 DIAGNOSIS — E1129 Type 2 diabetes mellitus with other diabetic kidney complication: Secondary | ICD-10-CM | POA: Diagnosis not present

## 2018-12-01 DIAGNOSIS — D509 Iron deficiency anemia, unspecified: Secondary | ICD-10-CM | POA: Diagnosis not present

## 2018-12-01 DIAGNOSIS — N2581 Secondary hyperparathyroidism of renal origin: Secondary | ICD-10-CM | POA: Diagnosis not present

## 2018-12-01 DIAGNOSIS — D631 Anemia in chronic kidney disease: Secondary | ICD-10-CM | POA: Diagnosis not present

## 2018-12-01 DIAGNOSIS — N186 End stage renal disease: Secondary | ICD-10-CM | POA: Diagnosis not present

## 2018-12-01 DIAGNOSIS — Z992 Dependence on renal dialysis: Secondary | ICD-10-CM | POA: Diagnosis not present

## 2018-12-04 DIAGNOSIS — N186 End stage renal disease: Secondary | ICD-10-CM | POA: Diagnosis not present

## 2018-12-04 DIAGNOSIS — D631 Anemia in chronic kidney disease: Secondary | ICD-10-CM | POA: Diagnosis not present

## 2018-12-04 DIAGNOSIS — E1129 Type 2 diabetes mellitus with other diabetic kidney complication: Secondary | ICD-10-CM | POA: Diagnosis not present

## 2018-12-04 DIAGNOSIS — D509 Iron deficiency anemia, unspecified: Secondary | ICD-10-CM | POA: Diagnosis not present

## 2018-12-04 DIAGNOSIS — N2581 Secondary hyperparathyroidism of renal origin: Secondary | ICD-10-CM | POA: Diagnosis not present

## 2018-12-04 DIAGNOSIS — Z992 Dependence on renal dialysis: Secondary | ICD-10-CM | POA: Diagnosis not present

## 2018-12-06 DIAGNOSIS — Z992 Dependence on renal dialysis: Secondary | ICD-10-CM | POA: Diagnosis not present

## 2018-12-06 DIAGNOSIS — D631 Anemia in chronic kidney disease: Secondary | ICD-10-CM | POA: Diagnosis not present

## 2018-12-06 DIAGNOSIS — N2581 Secondary hyperparathyroidism of renal origin: Secondary | ICD-10-CM | POA: Diagnosis not present

## 2018-12-06 DIAGNOSIS — D509 Iron deficiency anemia, unspecified: Secondary | ICD-10-CM | POA: Diagnosis not present

## 2018-12-06 DIAGNOSIS — E1129 Type 2 diabetes mellitus with other diabetic kidney complication: Secondary | ICD-10-CM | POA: Diagnosis not present

## 2018-12-06 DIAGNOSIS — N186 End stage renal disease: Secondary | ICD-10-CM | POA: Diagnosis not present

## 2018-12-07 DIAGNOSIS — E1122 Type 2 diabetes mellitus with diabetic chronic kidney disease: Secondary | ICD-10-CM | POA: Diagnosis not present

## 2018-12-07 DIAGNOSIS — Z992 Dependence on renal dialysis: Secondary | ICD-10-CM | POA: Diagnosis not present

## 2018-12-07 DIAGNOSIS — N186 End stage renal disease: Secondary | ICD-10-CM | POA: Diagnosis not present

## 2018-12-08 DIAGNOSIS — N2581 Secondary hyperparathyroidism of renal origin: Secondary | ICD-10-CM | POA: Diagnosis not present

## 2018-12-08 DIAGNOSIS — E1129 Type 2 diabetes mellitus with other diabetic kidney complication: Secondary | ICD-10-CM | POA: Diagnosis not present

## 2018-12-08 DIAGNOSIS — D631 Anemia in chronic kidney disease: Secondary | ICD-10-CM | POA: Diagnosis not present

## 2018-12-08 DIAGNOSIS — D509 Iron deficiency anemia, unspecified: Secondary | ICD-10-CM | POA: Diagnosis not present

## 2018-12-08 DIAGNOSIS — N186 End stage renal disease: Secondary | ICD-10-CM | POA: Diagnosis not present

## 2018-12-08 DIAGNOSIS — Z992 Dependence on renal dialysis: Secondary | ICD-10-CM | POA: Diagnosis not present

## 2018-12-11 DIAGNOSIS — N2581 Secondary hyperparathyroidism of renal origin: Secondary | ICD-10-CM | POA: Diagnosis not present

## 2018-12-11 DIAGNOSIS — N186 End stage renal disease: Secondary | ICD-10-CM | POA: Diagnosis not present

## 2018-12-11 DIAGNOSIS — Z992 Dependence on renal dialysis: Secondary | ICD-10-CM | POA: Diagnosis not present

## 2018-12-11 DIAGNOSIS — E1129 Type 2 diabetes mellitus with other diabetic kidney complication: Secondary | ICD-10-CM | POA: Diagnosis not present

## 2018-12-11 DIAGNOSIS — D509 Iron deficiency anemia, unspecified: Secondary | ICD-10-CM | POA: Diagnosis not present

## 2018-12-11 DIAGNOSIS — D631 Anemia in chronic kidney disease: Secondary | ICD-10-CM | POA: Diagnosis not present

## 2018-12-13 DIAGNOSIS — D631 Anemia in chronic kidney disease: Secondary | ICD-10-CM | POA: Diagnosis not present

## 2018-12-13 DIAGNOSIS — N186 End stage renal disease: Secondary | ICD-10-CM | POA: Diagnosis not present

## 2018-12-13 DIAGNOSIS — D509 Iron deficiency anemia, unspecified: Secondary | ICD-10-CM | POA: Diagnosis not present

## 2018-12-13 DIAGNOSIS — N2581 Secondary hyperparathyroidism of renal origin: Secondary | ICD-10-CM | POA: Diagnosis not present

## 2018-12-13 DIAGNOSIS — E1129 Type 2 diabetes mellitus with other diabetic kidney complication: Secondary | ICD-10-CM | POA: Diagnosis not present

## 2018-12-13 DIAGNOSIS — Z992 Dependence on renal dialysis: Secondary | ICD-10-CM | POA: Diagnosis not present

## 2018-12-15 DIAGNOSIS — D509 Iron deficiency anemia, unspecified: Secondary | ICD-10-CM | POA: Diagnosis not present

## 2018-12-15 DIAGNOSIS — N186 End stage renal disease: Secondary | ICD-10-CM | POA: Diagnosis not present

## 2018-12-15 DIAGNOSIS — Z992 Dependence on renal dialysis: Secondary | ICD-10-CM | POA: Diagnosis not present

## 2018-12-15 DIAGNOSIS — N2581 Secondary hyperparathyroidism of renal origin: Secondary | ICD-10-CM | POA: Diagnosis not present

## 2018-12-15 DIAGNOSIS — E1129 Type 2 diabetes mellitus with other diabetic kidney complication: Secondary | ICD-10-CM | POA: Diagnosis not present

## 2018-12-15 DIAGNOSIS — D631 Anemia in chronic kidney disease: Secondary | ICD-10-CM | POA: Diagnosis not present

## 2018-12-18 DIAGNOSIS — D631 Anemia in chronic kidney disease: Secondary | ICD-10-CM | POA: Diagnosis not present

## 2018-12-18 DIAGNOSIS — E1129 Type 2 diabetes mellitus with other diabetic kidney complication: Secondary | ICD-10-CM | POA: Diagnosis not present

## 2018-12-18 DIAGNOSIS — N2581 Secondary hyperparathyroidism of renal origin: Secondary | ICD-10-CM | POA: Diagnosis not present

## 2018-12-18 DIAGNOSIS — N186 End stage renal disease: Secondary | ICD-10-CM | POA: Diagnosis not present

## 2018-12-18 DIAGNOSIS — Z992 Dependence on renal dialysis: Secondary | ICD-10-CM | POA: Diagnosis not present

## 2018-12-18 DIAGNOSIS — D509 Iron deficiency anemia, unspecified: Secondary | ICD-10-CM | POA: Diagnosis not present

## 2018-12-20 DIAGNOSIS — D631 Anemia in chronic kidney disease: Secondary | ICD-10-CM | POA: Diagnosis not present

## 2018-12-20 DIAGNOSIS — N2581 Secondary hyperparathyroidism of renal origin: Secondary | ICD-10-CM | POA: Diagnosis not present

## 2018-12-20 DIAGNOSIS — Z992 Dependence on renal dialysis: Secondary | ICD-10-CM | POA: Diagnosis not present

## 2018-12-20 DIAGNOSIS — D509 Iron deficiency anemia, unspecified: Secondary | ICD-10-CM | POA: Diagnosis not present

## 2018-12-20 DIAGNOSIS — N186 End stage renal disease: Secondary | ICD-10-CM | POA: Diagnosis not present

## 2018-12-20 DIAGNOSIS — E1129 Type 2 diabetes mellitus with other diabetic kidney complication: Secondary | ICD-10-CM | POA: Diagnosis not present

## 2018-12-22 DIAGNOSIS — E1129 Type 2 diabetes mellitus with other diabetic kidney complication: Secondary | ICD-10-CM | POA: Diagnosis not present

## 2018-12-22 DIAGNOSIS — D631 Anemia in chronic kidney disease: Secondary | ICD-10-CM | POA: Diagnosis not present

## 2018-12-22 DIAGNOSIS — D509 Iron deficiency anemia, unspecified: Secondary | ICD-10-CM | POA: Diagnosis not present

## 2018-12-22 DIAGNOSIS — N2581 Secondary hyperparathyroidism of renal origin: Secondary | ICD-10-CM | POA: Diagnosis not present

## 2018-12-22 DIAGNOSIS — Z992 Dependence on renal dialysis: Secondary | ICD-10-CM | POA: Diagnosis not present

## 2018-12-22 DIAGNOSIS — N186 End stage renal disease: Secondary | ICD-10-CM | POA: Diagnosis not present

## 2018-12-25 DIAGNOSIS — Z992 Dependence on renal dialysis: Secondary | ICD-10-CM | POA: Diagnosis not present

## 2018-12-25 DIAGNOSIS — N2581 Secondary hyperparathyroidism of renal origin: Secondary | ICD-10-CM | POA: Diagnosis not present

## 2018-12-25 DIAGNOSIS — D631 Anemia in chronic kidney disease: Secondary | ICD-10-CM | POA: Diagnosis not present

## 2018-12-25 DIAGNOSIS — D509 Iron deficiency anemia, unspecified: Secondary | ICD-10-CM | POA: Diagnosis not present

## 2018-12-25 DIAGNOSIS — N186 End stage renal disease: Secondary | ICD-10-CM | POA: Diagnosis not present

## 2018-12-25 DIAGNOSIS — E1129 Type 2 diabetes mellitus with other diabetic kidney complication: Secondary | ICD-10-CM | POA: Diagnosis not present

## 2018-12-27 DIAGNOSIS — N186 End stage renal disease: Secondary | ICD-10-CM | POA: Diagnosis not present

## 2018-12-27 DIAGNOSIS — E1129 Type 2 diabetes mellitus with other diabetic kidney complication: Secondary | ICD-10-CM | POA: Diagnosis not present

## 2018-12-27 DIAGNOSIS — N2581 Secondary hyperparathyroidism of renal origin: Secondary | ICD-10-CM | POA: Diagnosis not present

## 2018-12-27 DIAGNOSIS — Z992 Dependence on renal dialysis: Secondary | ICD-10-CM | POA: Diagnosis not present

## 2018-12-27 DIAGNOSIS — D631 Anemia in chronic kidney disease: Secondary | ICD-10-CM | POA: Diagnosis not present

## 2018-12-27 DIAGNOSIS — D509 Iron deficiency anemia, unspecified: Secondary | ICD-10-CM | POA: Diagnosis not present

## 2018-12-29 DIAGNOSIS — N186 End stage renal disease: Secondary | ICD-10-CM | POA: Diagnosis not present

## 2018-12-29 DIAGNOSIS — N2581 Secondary hyperparathyroidism of renal origin: Secondary | ICD-10-CM | POA: Diagnosis not present

## 2018-12-29 DIAGNOSIS — D631 Anemia in chronic kidney disease: Secondary | ICD-10-CM | POA: Diagnosis not present

## 2018-12-29 DIAGNOSIS — Z992 Dependence on renal dialysis: Secondary | ICD-10-CM | POA: Diagnosis not present

## 2018-12-29 DIAGNOSIS — E1129 Type 2 diabetes mellitus with other diabetic kidney complication: Secondary | ICD-10-CM | POA: Diagnosis not present

## 2018-12-29 DIAGNOSIS — D509 Iron deficiency anemia, unspecified: Secondary | ICD-10-CM | POA: Diagnosis not present

## 2019-01-02 ENCOUNTER — Encounter (INDEPENDENT_AMBULATORY_CARE_PROVIDER_SITE_OTHER): Payer: Medicare Other | Admitting: Ophthalmology

## 2019-01-04 DIAGNOSIS — D509 Iron deficiency anemia, unspecified: Secondary | ICD-10-CM | POA: Diagnosis not present

## 2019-01-04 DIAGNOSIS — N186 End stage renal disease: Secondary | ICD-10-CM | POA: Diagnosis not present

## 2019-01-04 DIAGNOSIS — Z992 Dependence on renal dialysis: Secondary | ICD-10-CM | POA: Diagnosis not present

## 2019-01-04 DIAGNOSIS — D631 Anemia in chronic kidney disease: Secondary | ICD-10-CM | POA: Diagnosis not present

## 2019-01-04 DIAGNOSIS — E1129 Type 2 diabetes mellitus with other diabetic kidney complication: Secondary | ICD-10-CM | POA: Diagnosis not present

## 2019-01-04 DIAGNOSIS — N2581 Secondary hyperparathyroidism of renal origin: Secondary | ICD-10-CM | POA: Diagnosis not present

## 2019-01-07 DIAGNOSIS — E1122 Type 2 diabetes mellitus with diabetic chronic kidney disease: Secondary | ICD-10-CM | POA: Diagnosis not present

## 2019-01-07 DIAGNOSIS — N186 End stage renal disease: Secondary | ICD-10-CM | POA: Diagnosis not present

## 2019-01-07 DIAGNOSIS — Z992 Dependence on renal dialysis: Secondary | ICD-10-CM | POA: Diagnosis not present

## 2019-01-08 DIAGNOSIS — N2581 Secondary hyperparathyroidism of renal origin: Secondary | ICD-10-CM | POA: Diagnosis not present

## 2019-01-08 DIAGNOSIS — N186 End stage renal disease: Secondary | ICD-10-CM | POA: Diagnosis not present

## 2019-01-08 DIAGNOSIS — Z992 Dependence on renal dialysis: Secondary | ICD-10-CM | POA: Diagnosis not present

## 2019-01-08 DIAGNOSIS — D631 Anemia in chronic kidney disease: Secondary | ICD-10-CM | POA: Diagnosis not present

## 2019-01-08 DIAGNOSIS — E1129 Type 2 diabetes mellitus with other diabetic kidney complication: Secondary | ICD-10-CM | POA: Diagnosis not present

## 2019-01-08 DIAGNOSIS — D509 Iron deficiency anemia, unspecified: Secondary | ICD-10-CM | POA: Diagnosis not present

## 2019-01-09 ENCOUNTER — Encounter (INDEPENDENT_AMBULATORY_CARE_PROVIDER_SITE_OTHER): Payer: Medicare Other | Admitting: Ophthalmology

## 2019-01-09 ENCOUNTER — Other Ambulatory Visit: Payer: Self-pay

## 2019-01-09 DIAGNOSIS — E10311 Type 1 diabetes mellitus with unspecified diabetic retinopathy with macular edema: Secondary | ICD-10-CM | POA: Diagnosis not present

## 2019-01-09 DIAGNOSIS — H35033 Hypertensive retinopathy, bilateral: Secondary | ICD-10-CM | POA: Diagnosis not present

## 2019-01-09 DIAGNOSIS — E113393 Type 2 diabetes mellitus with moderate nonproliferative diabetic retinopathy without macular edema, bilateral: Secondary | ICD-10-CM

## 2019-01-09 DIAGNOSIS — H43813 Vitreous degeneration, bilateral: Secondary | ICD-10-CM

## 2019-01-09 DIAGNOSIS — I10 Essential (primary) hypertension: Secondary | ICD-10-CM | POA: Diagnosis not present

## 2019-01-10 DIAGNOSIS — N186 End stage renal disease: Secondary | ICD-10-CM | POA: Diagnosis not present

## 2019-01-10 DIAGNOSIS — D631 Anemia in chronic kidney disease: Secondary | ICD-10-CM | POA: Diagnosis not present

## 2019-01-10 DIAGNOSIS — Z992 Dependence on renal dialysis: Secondary | ICD-10-CM | POA: Diagnosis not present

## 2019-01-10 DIAGNOSIS — E1129 Type 2 diabetes mellitus with other diabetic kidney complication: Secondary | ICD-10-CM | POA: Diagnosis not present

## 2019-01-10 DIAGNOSIS — D509 Iron deficiency anemia, unspecified: Secondary | ICD-10-CM | POA: Diagnosis not present

## 2019-01-10 DIAGNOSIS — N2581 Secondary hyperparathyroidism of renal origin: Secondary | ICD-10-CM | POA: Diagnosis not present

## 2019-01-12 DIAGNOSIS — D631 Anemia in chronic kidney disease: Secondary | ICD-10-CM | POA: Diagnosis not present

## 2019-01-12 DIAGNOSIS — N186 End stage renal disease: Secondary | ICD-10-CM | POA: Diagnosis not present

## 2019-01-12 DIAGNOSIS — E1129 Type 2 diabetes mellitus with other diabetic kidney complication: Secondary | ICD-10-CM | POA: Diagnosis not present

## 2019-01-12 DIAGNOSIS — D509 Iron deficiency anemia, unspecified: Secondary | ICD-10-CM | POA: Diagnosis not present

## 2019-01-12 DIAGNOSIS — Z992 Dependence on renal dialysis: Secondary | ICD-10-CM | POA: Diagnosis not present

## 2019-01-12 DIAGNOSIS — N2581 Secondary hyperparathyroidism of renal origin: Secondary | ICD-10-CM | POA: Diagnosis not present

## 2019-01-15 DIAGNOSIS — N186 End stage renal disease: Secondary | ICD-10-CM | POA: Diagnosis not present

## 2019-01-15 DIAGNOSIS — Z992 Dependence on renal dialysis: Secondary | ICD-10-CM | POA: Diagnosis not present

## 2019-01-15 DIAGNOSIS — D631 Anemia in chronic kidney disease: Secondary | ICD-10-CM | POA: Diagnosis not present

## 2019-01-15 DIAGNOSIS — E1129 Type 2 diabetes mellitus with other diabetic kidney complication: Secondary | ICD-10-CM | POA: Diagnosis not present

## 2019-01-15 DIAGNOSIS — D509 Iron deficiency anemia, unspecified: Secondary | ICD-10-CM | POA: Diagnosis not present

## 2019-01-15 DIAGNOSIS — N2581 Secondary hyperparathyroidism of renal origin: Secondary | ICD-10-CM | POA: Diagnosis not present

## 2019-01-16 DIAGNOSIS — N186 End stage renal disease: Secondary | ICD-10-CM | POA: Diagnosis not present

## 2019-01-16 DIAGNOSIS — E8779 Other fluid overload: Secondary | ICD-10-CM | POA: Diagnosis not present

## 2019-01-16 DIAGNOSIS — N2581 Secondary hyperparathyroidism of renal origin: Secondary | ICD-10-CM | POA: Diagnosis not present

## 2019-01-17 DIAGNOSIS — N186 End stage renal disease: Secondary | ICD-10-CM | POA: Diagnosis not present

## 2019-01-17 DIAGNOSIS — Z992 Dependence on renal dialysis: Secondary | ICD-10-CM | POA: Diagnosis not present

## 2019-01-17 DIAGNOSIS — D631 Anemia in chronic kidney disease: Secondary | ICD-10-CM | POA: Diagnosis not present

## 2019-01-17 DIAGNOSIS — N2581 Secondary hyperparathyroidism of renal origin: Secondary | ICD-10-CM | POA: Diagnosis not present

## 2019-01-17 DIAGNOSIS — E1129 Type 2 diabetes mellitus with other diabetic kidney complication: Secondary | ICD-10-CM | POA: Diagnosis not present

## 2019-01-17 DIAGNOSIS — D509 Iron deficiency anemia, unspecified: Secondary | ICD-10-CM | POA: Diagnosis not present

## 2019-01-19 DIAGNOSIS — Z992 Dependence on renal dialysis: Secondary | ICD-10-CM | POA: Diagnosis not present

## 2019-01-19 DIAGNOSIS — N186 End stage renal disease: Secondary | ICD-10-CM | POA: Diagnosis not present

## 2019-01-19 DIAGNOSIS — D509 Iron deficiency anemia, unspecified: Secondary | ICD-10-CM | POA: Diagnosis not present

## 2019-01-19 DIAGNOSIS — D631 Anemia in chronic kidney disease: Secondary | ICD-10-CM | POA: Diagnosis not present

## 2019-01-19 DIAGNOSIS — N2581 Secondary hyperparathyroidism of renal origin: Secondary | ICD-10-CM | POA: Diagnosis not present

## 2019-01-19 DIAGNOSIS — E1129 Type 2 diabetes mellitus with other diabetic kidney complication: Secondary | ICD-10-CM | POA: Diagnosis not present

## 2019-01-22 DIAGNOSIS — N2581 Secondary hyperparathyroidism of renal origin: Secondary | ICD-10-CM | POA: Diagnosis not present

## 2019-01-22 DIAGNOSIS — D631 Anemia in chronic kidney disease: Secondary | ICD-10-CM | POA: Diagnosis not present

## 2019-01-22 DIAGNOSIS — D509 Iron deficiency anemia, unspecified: Secondary | ICD-10-CM | POA: Diagnosis not present

## 2019-01-22 DIAGNOSIS — E1129 Type 2 diabetes mellitus with other diabetic kidney complication: Secondary | ICD-10-CM | POA: Diagnosis not present

## 2019-01-22 DIAGNOSIS — Z992 Dependence on renal dialysis: Secondary | ICD-10-CM | POA: Diagnosis not present

## 2019-01-22 DIAGNOSIS — N186 End stage renal disease: Secondary | ICD-10-CM | POA: Diagnosis not present

## 2019-01-24 DIAGNOSIS — D509 Iron deficiency anemia, unspecified: Secondary | ICD-10-CM | POA: Diagnosis not present

## 2019-01-24 DIAGNOSIS — Z992 Dependence on renal dialysis: Secondary | ICD-10-CM | POA: Diagnosis not present

## 2019-01-24 DIAGNOSIS — N2581 Secondary hyperparathyroidism of renal origin: Secondary | ICD-10-CM | POA: Diagnosis not present

## 2019-01-24 DIAGNOSIS — D631 Anemia in chronic kidney disease: Secondary | ICD-10-CM | POA: Diagnosis not present

## 2019-01-24 DIAGNOSIS — N186 End stage renal disease: Secondary | ICD-10-CM | POA: Diagnosis not present

## 2019-01-24 DIAGNOSIS — E1129 Type 2 diabetes mellitus with other diabetic kidney complication: Secondary | ICD-10-CM | POA: Diagnosis not present

## 2019-01-26 DIAGNOSIS — N2581 Secondary hyperparathyroidism of renal origin: Secondary | ICD-10-CM | POA: Diagnosis not present

## 2019-01-26 DIAGNOSIS — D509 Iron deficiency anemia, unspecified: Secondary | ICD-10-CM | POA: Diagnosis not present

## 2019-01-26 DIAGNOSIS — E1129 Type 2 diabetes mellitus with other diabetic kidney complication: Secondary | ICD-10-CM | POA: Diagnosis not present

## 2019-01-26 DIAGNOSIS — Z992 Dependence on renal dialysis: Secondary | ICD-10-CM | POA: Diagnosis not present

## 2019-01-26 DIAGNOSIS — N186 End stage renal disease: Secondary | ICD-10-CM | POA: Diagnosis not present

## 2019-01-26 DIAGNOSIS — D631 Anemia in chronic kidney disease: Secondary | ICD-10-CM | POA: Diagnosis not present

## 2019-01-29 ENCOUNTER — Other Ambulatory Visit: Payer: Self-pay

## 2019-01-29 ENCOUNTER — Emergency Department (HOSPITAL_COMMUNITY): Payer: Medicare Other

## 2019-01-29 ENCOUNTER — Inpatient Hospital Stay (HOSPITAL_COMMUNITY)
Admission: EM | Admit: 2019-01-29 | Discharge: 2019-02-01 | DRG: 291 | Disposition: A | Discharge status: Home / Self Care | Payer: Medicare Other | Attending: Internal Medicine | Admitting: Internal Medicine

## 2019-01-29 DIAGNOSIS — Z1159 Encounter for screening for other viral diseases: Secondary | ICD-10-CM

## 2019-01-29 DIAGNOSIS — D631 Anemia in chronic kidney disease: Secondary | ICD-10-CM | POA: Diagnosis present

## 2019-01-29 DIAGNOSIS — I5033 Acute on chronic diastolic (congestive) heart failure: Secondary | ICD-10-CM | POA: Diagnosis not present

## 2019-01-29 DIAGNOSIS — N186 End stage renal disease: Secondary | ICD-10-CM | POA: Diagnosis present

## 2019-01-29 DIAGNOSIS — E8770 Fluid overload, unspecified: Secondary | ICD-10-CM | POA: Diagnosis not present

## 2019-01-29 DIAGNOSIS — Z992 Dependence on renal dialysis: Secondary | ICD-10-CM

## 2019-01-29 DIAGNOSIS — E1121 Type 2 diabetes mellitus with diabetic nephropathy: Secondary | ICD-10-CM | POA: Diagnosis not present

## 2019-01-29 DIAGNOSIS — I120 Hypertensive chronic kidney disease with stage 5 chronic kidney disease or end stage renal disease: Secondary | ICD-10-CM | POA: Diagnosis not present

## 2019-01-29 DIAGNOSIS — I10 Essential (primary) hypertension: Secondary | ICD-10-CM | POA: Diagnosis present

## 2019-01-29 DIAGNOSIS — I160 Hypertensive urgency: Secondary | ICD-10-CM | POA: Diagnosis not present

## 2019-01-29 DIAGNOSIS — Z8249 Family history of ischemic heart disease and other diseases of the circulatory system: Secondary | ICD-10-CM

## 2019-01-29 DIAGNOSIS — J811 Chronic pulmonary edema: Secondary | ICD-10-CM | POA: Diagnosis present

## 2019-01-29 DIAGNOSIS — Z888 Allergy status to other drugs, medicaments and biological substances status: Secondary | ICD-10-CM

## 2019-01-29 DIAGNOSIS — D649 Anemia, unspecified: Secondary | ICD-10-CM | POA: Diagnosis present

## 2019-01-29 DIAGNOSIS — J45909 Unspecified asthma, uncomplicated: Secondary | ICD-10-CM | POA: Diagnosis present

## 2019-01-29 DIAGNOSIS — I152 Hypertension secondary to endocrine disorders: Secondary | ICD-10-CM | POA: Diagnosis present

## 2019-01-29 DIAGNOSIS — Z20828 Contact with and (suspected) exposure to other viral communicable diseases: Secondary | ICD-10-CM | POA: Diagnosis not present

## 2019-01-29 DIAGNOSIS — J9601 Acute respiratory failure with hypoxia: Secondary | ICD-10-CM | POA: Diagnosis present

## 2019-01-29 DIAGNOSIS — Z86011 Personal history of benign neoplasm of the brain: Secondary | ICD-10-CM

## 2019-01-29 DIAGNOSIS — I5043 Acute on chronic combined systolic (congestive) and diastolic (congestive) heart failure: Secondary | ICD-10-CM | POA: Diagnosis not present

## 2019-01-29 DIAGNOSIS — I132 Hypertensive heart and chronic kidney disease with heart failure and with stage 5 chronic kidney disease, or end stage renal disease: Secondary | ICD-10-CM | POA: Diagnosis not present

## 2019-01-29 DIAGNOSIS — E1169 Type 2 diabetes mellitus with other specified complication: Secondary | ICD-10-CM | POA: Diagnosis present

## 2019-01-29 DIAGNOSIS — R0902 Hypoxemia: Secondary | ICD-10-CM | POA: Diagnosis not present

## 2019-01-29 DIAGNOSIS — E8889 Other specified metabolic disorders: Secondary | ICD-10-CM | POA: Diagnosis present

## 2019-01-29 DIAGNOSIS — J810 Acute pulmonary edema: Secondary | ICD-10-CM | POA: Diagnosis not present

## 2019-01-29 DIAGNOSIS — E669 Obesity, unspecified: Secondary | ICD-10-CM | POA: Diagnosis present

## 2019-01-29 DIAGNOSIS — Z8673 Personal history of transient ischemic attack (TIA), and cerebral infarction without residual deficits: Secondary | ICD-10-CM

## 2019-01-29 DIAGNOSIS — Z8349 Family history of other endocrine, nutritional and metabolic diseases: Secondary | ICD-10-CM

## 2019-01-29 DIAGNOSIS — E785 Hyperlipidemia, unspecified: Secondary | ICD-10-CM | POA: Diagnosis present

## 2019-01-29 DIAGNOSIS — M170 Bilateral primary osteoarthritis of knee: Secondary | ICD-10-CM | POA: Diagnosis present

## 2019-01-29 DIAGNOSIS — Z7982 Long term (current) use of aspirin: Secondary | ICD-10-CM

## 2019-01-29 DIAGNOSIS — R0602 Shortness of breath: Secondary | ICD-10-CM | POA: Diagnosis not present

## 2019-01-29 DIAGNOSIS — E1122 Type 2 diabetes mellitus with diabetic chronic kidney disease: Secondary | ICD-10-CM | POA: Diagnosis present

## 2019-01-29 DIAGNOSIS — R0689 Other abnormalities of breathing: Secondary | ICD-10-CM | POA: Diagnosis not present

## 2019-01-29 DIAGNOSIS — E1159 Type 2 diabetes mellitus with other circulatory complications: Secondary | ICD-10-CM | POA: Diagnosis present

## 2019-01-29 DIAGNOSIS — R069 Unspecified abnormalities of breathing: Secondary | ICD-10-CM | POA: Diagnosis not present

## 2019-01-29 DIAGNOSIS — Z90710 Acquired absence of both cervix and uterus: Secondary | ICD-10-CM

## 2019-01-29 DIAGNOSIS — Z841 Family history of disorders of kidney and ureter: Secondary | ICD-10-CM

## 2019-01-29 DIAGNOSIS — E1129 Type 2 diabetes mellitus with other diabetic kidney complication: Secondary | ICD-10-CM | POA: Diagnosis present

## 2019-01-29 DIAGNOSIS — E875 Hyperkalemia: Secondary | ICD-10-CM | POA: Diagnosis not present

## 2019-01-29 DIAGNOSIS — R05 Cough: Secondary | ICD-10-CM | POA: Diagnosis not present

## 2019-01-29 DIAGNOSIS — Z209 Contact with and (suspected) exposure to unspecified communicable disease: Secondary | ICD-10-CM | POA: Diagnosis not present

## 2019-01-29 DIAGNOSIS — Z794 Long term (current) use of insulin: Secondary | ICD-10-CM

## 2019-01-29 DIAGNOSIS — R059 Cough, unspecified: Secondary | ICD-10-CM

## 2019-01-29 DIAGNOSIS — Z79899 Other long term (current) drug therapy: Secondary | ICD-10-CM

## 2019-01-29 DIAGNOSIS — I2510 Atherosclerotic heart disease of native coronary artery without angina pectoris: Secondary | ICD-10-CM | POA: Diagnosis present

## 2019-01-29 DIAGNOSIS — Z6834 Body mass index (BMI) 34.0-34.9, adult: Secondary | ICD-10-CM

## 2019-01-29 DIAGNOSIS — I428 Other cardiomyopathies: Secondary | ICD-10-CM | POA: Diagnosis present

## 2019-01-29 DIAGNOSIS — Z87891 Personal history of nicotine dependence: Secondary | ICD-10-CM

## 2019-01-29 DIAGNOSIS — I255 Ischemic cardiomyopathy: Secondary | ICD-10-CM | POA: Diagnosis present

## 2019-01-29 DIAGNOSIS — N189 Chronic kidney disease, unspecified: Secondary | ICD-10-CM | POA: Diagnosis present

## 2019-01-29 HISTORY — DX: Chronic pulmonary edema: J81.1

## 2019-01-29 HISTORY — DX: Acute respiratory failure with hypoxia: J96.01

## 2019-01-29 LAB — CBC WITH DIFFERENTIAL/PLATELET
Abs Immature Granulocytes: 0.03 10*3/uL (ref 0.00–0.07)
Basophils Absolute: 0 10*3/uL (ref 0.0–0.1)
Basophils Relative: 0 %
Eosinophils Absolute: 0.7 10*3/uL — ABNORMAL HIGH (ref 0.0–0.5)
Eosinophils Relative: 7 %
HCT: 38.3 % (ref 36.0–46.0)
Hemoglobin: 11.5 g/dL — ABNORMAL LOW (ref 12.0–15.0)
Immature Granulocytes: 0 %
Lymphocytes Relative: 14 %
Lymphs Abs: 1.3 10*3/uL (ref 0.7–4.0)
MCH: 30.6 pg (ref 26.0–34.0)
MCHC: 30 g/dL (ref 30.0–36.0)
MCV: 101.9 fL — ABNORMAL HIGH (ref 80.0–100.0)
Monocytes Absolute: 0.5 10*3/uL (ref 0.1–1.0)
Monocytes Relative: 5 %
Neutro Abs: 6.5 10*3/uL (ref 1.7–7.7)
Neutrophils Relative %: 74 %
Platelets: 236 10*3/uL (ref 150–400)
RBC: 3.76 MIL/uL — ABNORMAL LOW (ref 3.87–5.11)
RDW: 15.6 % — ABNORMAL HIGH (ref 11.5–15.5)
WBC: 9 10*3/uL (ref 4.0–10.5)
nRBC: 0 % (ref 0.0–0.2)

## 2019-01-29 LAB — COMPREHENSIVE METABOLIC PANEL
ALT: 15 U/L (ref 0–44)
AST: 27 U/L (ref 15–41)
Albumin: 3.5 g/dL (ref 3.5–5.0)
Alkaline Phosphatase: 71 U/L (ref 38–126)
Anion gap: 11 (ref 5–15)
BUN: 52 mg/dL — ABNORMAL HIGH (ref 8–23)
CO2: 25 mmol/L (ref 22–32)
Calcium: 8.9 mg/dL (ref 8.9–10.3)
Chloride: 101 mmol/L (ref 98–111)
Creatinine, Ser: 6.49 mg/dL — ABNORMAL HIGH (ref 0.44–1.00)
GFR calc Af Amer: 7 mL/min — ABNORMAL LOW (ref >60)
GFR calc non Af Amer: 6 mL/min — ABNORMAL LOW (ref >60)
Glucose, Bld: 107 mg/dL — ABNORMAL HIGH (ref 70–99)
Potassium: 6 mmol/L — ABNORMAL HIGH (ref 3.5–5.1)
Sodium: 137 mmol/L (ref 135–145)
Total Bilirubin: 0.8 mg/dL (ref 0.3–1.2)
Total Protein: 6.6 g/dL (ref 6.5–8.1)

## 2019-01-29 LAB — BRAIN NATRIURETIC PEPTIDE: B Natriuretic Peptide: 1852.6 pg/mL — ABNORMAL HIGH (ref 0.0–100.0)

## 2019-01-29 LAB — SARS CORONAVIRUS 2 BY RT PCR (HOSPITAL ORDER, PERFORMED IN ~~LOC~~ HOSPITAL LAB): SARS Coronavirus 2: NEGATIVE

## 2019-01-29 MED ORDER — HYDRALAZINE HCL 50 MG PO TABS
50.0000 mg | ORAL_TABLET | Freq: Three times a day (TID) | ORAL | Status: DC
  Administered 2019-01-30 – 2019-02-01 (×7): 50 mg via ORAL
  Filled 2019-01-29 (×7): qty 1

## 2019-01-29 MED ORDER — CALCIUM ACETATE (PHOS BINDER) 667 MG PO CAPS
667.0000 mg | ORAL_CAPSULE | ORAL | Status: DC | PRN
  Filled 2019-01-29: qty 1

## 2019-01-29 MED ORDER — BRIMONIDINE TARTRATE 0.2 % OP SOLN
1.0000 [drp] | Freq: Two times a day (BID) | OPHTHALMIC | Status: DC
  Administered 2019-01-30 – 2019-02-01 (×6): 1 [drp] via OPHTHALMIC
  Filled 2019-01-29 (×2): qty 5

## 2019-01-29 MED ORDER — CALCIUM ACETATE (PHOS BINDER) 667 MG PO CAPS
2001.0000 mg | ORAL_CAPSULE | Freq: Three times a day (TID) | ORAL | Status: DC
  Administered 2019-01-30 – 2019-02-01 (×6): 2001 mg via ORAL
  Filled 2019-01-29 (×7): qty 3

## 2019-01-29 MED ORDER — SODIUM POLYSTYRENE SULFONATE 15 GM/60ML PO SUSP
30.0000 g | Freq: Once | ORAL | Status: DC
  Filled 2019-01-29: qty 120

## 2019-01-29 MED ORDER — CALCITRIOL 0.25 MCG PO CAPS
0.5000 ug | ORAL_CAPSULE | ORAL | Status: DC
  Administered 2019-01-31: 0.5 ug via ORAL
  Filled 2019-01-29: qty 2

## 2019-01-29 MED ORDER — TIMOLOL MALEATE 0.5 % OP SOLN
1.0000 [drp] | Freq: Two times a day (BID) | OPHTHALMIC | Status: DC
  Administered 2019-01-30 – 2019-02-01 (×6): 1 [drp] via OPHTHALMIC
  Filled 2019-01-29 (×2): qty 5

## 2019-01-29 MED ORDER — DEXTROSE 50 % IV SOLN
50.0000 mL | Freq: Once | INTRAVENOUS | Status: DC
  Filled 2019-01-29: qty 50

## 2019-01-29 MED ORDER — RENA-VITE PO TABS
1.0000 | ORAL_TABLET | Freq: Every day | ORAL | Status: DC
  Administered 2019-01-30 – 2019-01-31 (×2): 1 via ORAL
  Filled 2019-01-29 (×3): qty 1

## 2019-01-29 MED ORDER — BRIMONIDINE TARTRATE-TIMOLOL 0.2-0.5 % OP SOLN: 1.0000 [drp] | Freq: Two times a day (BID) | OPHTHALMIC | Status: DC

## 2019-01-29 MED ORDER — INSULIN ASPART 100 UNIT/ML IV SOLN: 5.0000 [IU] | Freq: Once | INTRAVENOUS | Status: DC

## 2019-01-29 MED ORDER — HYDROCOD POLST-CPM POLST ER 10-8 MG/5ML PO SUER
5.0000 mL | Freq: Once | ORAL | Status: AC
  Administered 2019-01-29: 5 mL via ORAL
  Filled 2019-01-29: qty 5

## 2019-01-29 MED ORDER — HYDRALAZINE HCL 25 MG PO TABS
25.0000 mg | ORAL_TABLET | Freq: Once | ORAL | Status: AC
  Administered 2019-01-29: 25 mg via ORAL
  Filled 2019-01-29: qty 1

## 2019-01-29 MED ORDER — INSULIN DETEMIR 100 UNIT/ML ~~LOC~~ SOLN
15.0000 [IU] | Freq: Every day | SUBCUTANEOUS | Status: DC
  Filled 2019-01-29: qty 0.15

## 2019-01-29 MED ORDER — POLYETHYLENE GLYCOL 3350 17 G PO PACK: 17.0000 g | PACK | Freq: Every day | ORAL | Status: DC | PRN

## 2019-01-29 MED ORDER — ACETAMINOPHEN 325 MG PO TABS
650.0000 mg | ORAL_TABLET | ORAL | Status: DC | PRN
  Administered 2019-02-01: 650 mg via ORAL
  Filled 2019-01-29: qty 2

## 2019-01-29 MED ORDER — TIMOLOL MALEATE 0.5 % OP SOLN: 1.0000 [drp] | Freq: Every day | OPHTHALMIC | Status: DC

## 2019-01-29 MED ORDER — HYDRALAZINE HCL 20 MG/ML IJ SOLN: 5.0000 mg | INTRAMUSCULAR | Status: DC | PRN

## 2019-01-29 MED ORDER — CHLORHEXIDINE GLUCONATE CLOTH 2 % EX PADS
6.0000 | MEDICATED_PAD | Freq: Every day | CUTANEOUS | Status: DC
  Administered 2019-01-30 – 2019-02-01 (×3): 6 via TOPICAL

## 2019-01-29 MED ORDER — CARVEDILOL 25 MG PO TABS
25.0000 mg | ORAL_TABLET | Freq: Two times a day (BID) | ORAL | Status: DC
  Administered 2019-01-30 – 2019-02-01 (×4): 25 mg via ORAL
  Filled 2019-01-29 (×4): qty 1

## 2019-01-29 MED ORDER — ALBUTEROL SULFATE HFA 108 (90 BASE) MCG/ACT IN AERS
2.0000 | INHALATION_SPRAY | Freq: Once | RESPIRATORY_TRACT | Status: AC
  Administered 2019-01-29: 08:00:00 2 via RESPIRATORY_TRACT
  Filled 2019-01-29: qty 6.7

## 2019-01-29 MED ORDER — ASPIRIN EC 81 MG PO TBEC
162.0000 mg | DELAYED_RELEASE_TABLET | Freq: Every day | ORAL | Status: DC
  Administered 2019-01-30 – 2019-02-01 (×3): 162 mg via ORAL
  Filled 2019-01-29 (×3): qty 2

## 2019-01-29 MED ORDER — LATANOPROST 0.005 % OP SOLN
1.0000 [drp] | Freq: Every evening | OPHTHALMIC | Status: DC
  Administered 2019-01-30 – 2019-01-31 (×3): 1 [drp] via OPHTHALMIC
  Filled 2019-01-29 (×2): qty 2.5

## 2019-01-29 MED ORDER — PANTOPRAZOLE SODIUM 40 MG PO TBEC
40.0000 mg | DELAYED_RELEASE_TABLET | Freq: Two times a day (BID) | ORAL | Status: DC
  Administered 2019-01-30 – 2019-02-01 (×5): 40 mg via ORAL
  Filled 2019-01-29 (×5): qty 1

## 2019-01-29 MED ORDER — CARVEDILOL 12.5 MG PO TABS
25.0000 mg | ORAL_TABLET | Freq: Once | ORAL | Status: AC
  Administered 2019-01-29: 25 mg via ORAL
  Filled 2019-01-29: qty 2

## 2019-01-29 MED ORDER — ATORVASTATIN CALCIUM 40 MG PO TABS
40.0000 mg | ORAL_TABLET | Freq: Every day | ORAL | Status: DC
  Administered 2019-01-30 – 2019-01-31 (×2): 40 mg via ORAL
  Filled 2019-01-29 (×2): qty 1

## 2019-01-29 NOTE — Progress Notes (Signed)
CSW received consult for patient to assist with transportation to dialysis. CSW waiting for patient's COVID results to come back as negative and it did. CSW attempted to sent patient to dialysis via cab however after discussing with Alexa, RN and Dr. Tyrone Nine, inpatient HD is more appropriate due to patient's current need for oxygen.  Patient will receive HD at [facility]. Please call CSW for assistance with transportation home if needed.  Madilyn Fireman, MSW, LCSW-A Clinical Social Worker [facility] [phone]

## 2019-01-29 NOTE — Consult Note (Signed)
Renal Service Consult Note [facility]  Kathleen [name] 01/29/2019 Sol Blazing Requesting Physician:  Dr Tyrone Nine, D  Reason for Consult:  SOB, ESRD pt HPI: The patient is a 73 y.o. year-old with hx of HTN, DM, and ESRD on HD TTS.  Had last HD Sat.  Came in for SOB, low SpO2 per EMS and feeling better here on O2.  CXR showed early pulm edema. Asked to see for HD.    Pt denies any fever, prod cough, CP or chills.  NO abd pain , n/v/d.    ROS  denies CP  no joint pain   no HA  no blurry vision  no rash  no diarrhea  no nausea/ vomiting    Past Medical History  Past Medical History:  Diagnosis Date  . Anemia    a. 01/2014: suspected of chronic disease, negative FOBT.  [name]    knees  . Asthma    many years ago  . CAD (coronary artery disease)    a. Cath 01/2014: mild in LAD, mild-mod LCx, mod-severe RCA - for med rx.  . Diabetes mellitus without complication ([name])   . ESRD (end stage renal disease) ([name])    [name]  . History of echocardiogram    Echo (10/15):  Mod LVH, EF 50-55%, Gr 1 DD, MAC, mild MR, mild TR, PASP 35 mmHg  . Hx of transfusion   . Hyperlipidemia   . Hypertension   . Meningioma ([name])    a. Incidental dx 01/2014.  . Obesity   . Renal insufficiency   . Systolic CHF ([name])    a. 08/270: dx with mixed ICM/NICM (out of proportion to CAD) EF 25-30% by echo.    Past Surgical History  Past Surgical History:  Procedure Laterality Date  . ABDOMINAL HYSTERECTOMY    . AV FISTULA PLACEMENT Left 02/10/2017   Procedure: ARTERIOVENOUS (AV) FISTULA CREATION-LEFT FOREARM;  Surgeon: Elam Dutch, MD;  Location: [place];  Service: Vascular;  Laterality: Left;  . BIOPSY  09/04/2018   Procedure: BIOPSY;  Surgeon: Jerene Bears, MD;  Location: [name] ENDOSCOPY;  Service: Gastroenterology;;  . BREAST SURGERY Right    Lumpectomy  . COLONOSCOPY    . COLONOSCOPY WITH PROPOFOL N/A 09/04/2018   Procedure: COLONOSCOPY WITH PROPOFOL;   Surgeon: Jerene Bears, MD;  Location: [name];  Service: Gastroenterology;  Laterality: N/A;  . ESOPHAGOGASTRODUODENOSCOPY (EGD) WITH PROPOFOL N/A 09/04/2018   Procedure: ESOPHAGOGASTRODUODENOSCOPY (EGD) WITH PROPOFOL;  Surgeon: Jerene Bears, MD;  Location: [name] ENDOSCOPY;  Service: Gastroenterology;  Laterality: N/A;  . FISTULA SUPERFICIALIZATION Left 08/09/2017   Procedure: FISTULA SUPERFICIALIZATION LEFT ARM ARTERIOVENOUS FISTULA;  Surgeon: Conrad Eldora, MD;  Location: [name];  Service: Vascular;  Laterality: Left;  . INSERTION OF DIALYSIS CATHETER Right 02/10/2017   Procedure: INSERTION OF DIALYSIS CATHETER;  Surgeon: Elam Dutch, MD;  Location: [name];  Service: Vascular;  Laterality: Right;  . LEFT HEART CATHETERIZATION WITH CORONARY ANGIOGRAM N/A 01/27/2014   Procedure: LEFT HEART CATHETERIZATION WITH CORONARY ANGIOGRAM;  Surgeon: Jettie Booze, MD;  Location: [name];  Service: Cardiovascular;  Laterality: N/A;  . POLYPECTOMY  09/04/2018   Procedure: POLYPECTOMY;  Surgeon: Jerene Bears, MD;  Location: [name] ENDOSCOPY;  Service: Gastroenterology;;  . TONSILLECTOMY     Family History  Family History  Problem Relation Age of Onset  . Diabetic kidney disease Mother   . Hypertension Mother   . Heart failure Mother   .  Heart attack Mother   . Hyperlipidemia Mother   . Hypertension Father    Social History  reports that she has quit smoking. Her smoking use included cigarettes. She has a 7.50 pack-year smoking history. She has never used smokeless tobacco. She reports that she does not drink alcohol or use drugs. Allergies  Allergies  Allergen Reactions  . Baclofen     Severe delirium when given to pateint in Dec 2018.    Home medications Prior to Admission medications   Medication Sig Start Date End Date Taking? Authorizing Provider  acetaminophen (TYLENOL) 325 MG tablet Take 2 tablets (650 mg total) by mouth every 4 (four) hours as needed for mild pain (or temp > 37.5 C  (99.5 F)). 11/11/17  Yes [name], MD  aspirin 81 MG EC tablet Take 4 tablets (325 mg total) by mouth daily. Patient taking differently: Take 162 mg by mouth daily.  11/11/17  Yes [name], [name], MD  brimonidine-timolol (COMBIGAN) 0.2-0.5 % ophthalmic solution Place 1 drop into both eyes every 12 (twelve) hours.    Yes [provider]  calcium acetate (PHOSLO) 667 MG capsule Take 1 capsule (667 mg total) by mouth as needed (with snacks). Patient taking differently: Take 667-2,001 mg by mouth See admin instructions. Take 3 capsules (2057m) with a meal three times a day. Take 667 mg with snack 11/11/17  Yes [name], [name] MD  carvedilol (COREG) 25 MG tablet Take 25 mg by mouth 2 (two) times daily. 03/28/17  Yes [provider]  hydrALAZINE (APRESOLINE) 25 MG tablet Take 1 tablet (25 mg total) by mouth every 8 (eight) hours as needed (SBP > 170  or DBP > 110). Patient taking differently: Take 50 mg by mouth 3 (three) times daily.  02/20/17  Yes Mikhail, [name], DO  latanoprost (XALATAN) 0.005 % ophthalmic solution Place 1 drop into both eyes every evening.   Yes [provider]  Multiple Vitamins-Minerals (RENAPLEX) TABS Take 1 tablet by mouth daily. 07/10/18  Yes [provider]  pantoprazole (PROTONIX) 40 MG tablet Take 1 tablet (40 mg total) by mouth 2 (two) times daily before a meal. Take 1 tablet twice daily for a  Month then once dailly 09/04/18  Yes [name], [name] MD  polyethylene glycol (MIRALAX / GLYCOLAX) packet Take 17 g by mouth daily. Patient taking differently: Take 17 g by mouth daily as needed for mild constipation.  02/20/17  Yes Mikhail, [name] DO  atorvastatin (LIPITOR) 40 MG tablet Take 1 tablet (40 mg total) by mouth daily at 6 PM. Patient not taking: Reported on 01/29/2019 07/19/17   [name] MD  calcitRIOL (ROCALTROL) 0.5 MCG capsule Take 1 capsule (0.5 mcg total) by mouth Every Tuesday,Thursday,and Saturday with dialysis. Patient not  taking: Reported on 01/29/2019 02/21/17   MCristal Ford DO  insulin detemir (LEVEMIR) 100 UNIT/ML injection Inject 0.15 mLs (15 Units total) into the skin at bedtime. Patient taking differently: Inject 15 Units into the skin at bedtime.  02/20/17   MCristal Ford DO  multivitamin (RENA-VIT) TABS tablet Take 1 tablet by mouth at bedtime. Patient not taking: Reported on 01/29/2019 02/20/17   MCristal Ford DO   Liver Function Tests Recent Labs  Lab 01/29/19 0840  AST 27  ALT 15  ALKPHOS 71  BILITOT 0.8  PROT 6.6  ALBUMIN 3.5   No results for input(s): LIPASE, AMYLASE in the last 168 hours. CBC Recent Labs  Lab 01/29/19 0840  WBC 9.0  NEUTROABS 6.5  HGB 11.5*  HCT 38.3  MCV 101.9*  PLT 366   Basic Metabolic Panel Recent Labs  Lab 01/29/19 0840  NA 137  K 6.0*  CL 101  CO2 25  GLUCOSE 107*  BUN 52*  CREATININE 6.49*  CALCIUM 8.9   Iron/TIBC/Ferritin/ %Sat    Component Value Date/Time   IRON 30 02/14/2017 1030   TIBC 216 (L) 02/14/2017 1030   FERRITIN 101 02/14/2017 1030   IRONPCTSAT 14 02/14/2017 1030    Vitals:   01/29/19 0900 01/29/19 0936 01/29/19 1030 01/29/19 1145  BP:  132/68 (!) 133/59 (!) 125/57  Pulse:  76 (!) 57 62  Resp:  _0 Temp:      TempSrc:      SpO2: 97% 96% 98% 99%    Exam Gen elderly AAF not in distress, dry cough No rash, cyanosis or gangrene Sclera anicteric, throat clear  No jvd or bruits Chest bibasilar crackles, no wheezing or ^wob RRR no MRG  Abd soft ntnd no mass or ascites +bs  GU defer MS no joint effusions or deformity Ext 1+ bilat LE edema Neuro is alert, Ox 3 , nf, gen weak    Outpt HD: TTS [place]  4h   86kg   2/2.25 bath  LUA AVF  Hep none     Assessment/ Plan: 1. SOB/ acute pulm edema/ vol overload - plan HD upstairs this afternoon, if stable afterwards will be dc'd home. Will need standing wt pre or post HD.  2. ESRD HD TTS 3. HTN 4. DM2 5. CAD      [name]  MD 01/29/2019, 12:19  PM

## 2019-01-29 NOTE — ED Provider Notes (Signed)
Patient was initially seen by Dr. Tyrone Nine.  She had presented with shortness of breath and fluid overload.  She was seen by nephrology and it was felt that she can be dialysized today and probably discharged home from dialysis.  However after she was monitored in the ED for several hours, it was clear that she would not obtain dialysis until between midnight and 3 AM according to the dialysis unit.  Given this, I spoke with Dr. Blaine Hamper with the hospitalist service who will go ahead and admit the patient overnight.  I also notified Dr. Jimmy Footman with nephrology.   Malvin Johns, MD 01/29/19 2041

## 2019-01-29 NOTE — Progress Notes (Signed)
Renal Navigator received call from Renal PA to see if treatment at OP HD clinic is an option today. Renal Navigator contacted [place] [facility] and patient's regularly scheduled seat is at 12:05pm. If patient's COVID rapid test results as negative within the next 30 minutes, patient can go to OP HD clinic for treatment today. Renal Navigator discussed with clinic staff, Renal PA and CSW/C. [name] who will provide patient with cab voucher if necessary.  Renal Navigator will follow lab result.  Kathleen [name], [place] Renal Navigator [phone]

## 2019-01-29 NOTE — Progress Notes (Signed)
Renal Navigator spoke with CSW regarding patient's negative COVID 19 rapid test result. CSW states she attempted to get cab voucher for patient to go to OP HD clinic, however was informed by medical staff that patient was too unstable and needed inpt HD today. Renal Navigator notified Renal PA. Renal Navigator notified [facility] of patient's negative COVID 19 test result.  Alphonzo Cruise, [place] Renal Navigator [phone]

## 2019-01-29 NOTE — ED Triage Notes (Addendum)
Patient brought in by ems for c/o sob and cough that began last night; upon ems arrival patient was 80% on RA  ems placed her on 4 L of [place] ; no fevers ; ems bp was 242/132 cbg 105 ; pt goes to HD on T,TH, SAT , last went on sat ; hx of chf ;

## 2019-01-29 NOTE — H&P (Addendum)
History and Physical    [name]:[id number] DOB: 10/03/1945 DOA: 01/29/2019  Referring MD/NP/PA:   PCP: Benito Mccreedy, MD   Patient coming from:  The patient is coming from home.  At baseline, pt is independent for most of ADL.        Chief Complaint: SOB  HPI: Kathleen [name] is a 73 y.o. female with medical history significant of  HTN, DM, and ESRD on HD (TTS), HLD, stroke, GERD, anemia, CAD, dCHF, who presents with shortness of breath.  Patient states that he started having shortness of breath since last night, which has been progressively worsening.  He has some dry cough, denies chest pain, fever or chills.  Patient was found to have oxygen desaturation to 80% on room air in ED.  Need 4 L nasal cannula oxygen.  Patient has nausea, no vomiting, diarrhea or abdominal pain.  No symptoms of UTI.  Patient had dialysis on Saturday.  Patient had elevated blood pressure 242/132, which improved to 172/76 in ED after giving home medication Coreg and hydralazine.  ED Course: pt was found to have potassium 6.0, bicarbonate of 25, creatinine 6.49, BUN 52, WBC 9.0, BMP 1852, negative COVID-19 test, chest x-ray showed cardiomegaly and pulmonary edema.  Temperature normal, currently blood pressure 172/76, oxygen saturation 95-99% on 4 L nasal cannula oxygen. Pt is placed on tele bed for obs. Renal, Dr. Jonnie Finner was consulted for HD.  Review of Systems:   General: no fevers, chills, no body weight gain, has fatigue HEENT: no blurry vision, hearing changes or sore throat Respiratory: has dyspnea, coughing, no wheezing CV: no chest pain, no palpitations GI: has nausea, no vomiting, abdominal pain, diarrhea, constipation GU: no dysuria, burning on urination, increased urinary frequency, hematuria  Ext: has trace leg edema Neuro: no unilateral weakness, numbness, or tingling, no vision change or hearing loss Skin: no rash, no skin tear. MSK: No muscle spasm, no deformity, no limitation  of range of movement in spin Heme: No easy bruising.  Travel history: No recent long distant travel.  Allergy:  Allergies  Allergen Reactions  . Baclofen     Severe delirium when given to pateint in Dec 2018.     Past Medical History:  Diagnosis Date  . Anemia    a. 01/2014: suspected of chronic disease, negative FOBT.  [name]    knees  . Asthma    many years ago  . CAD (coronary artery disease)    a. Cath 01/2014: mild in LAD, mild-mod LCx, mod-severe RCA - for med rx.  . Diabetes mellitus without complication ([place])   . ESRD (end stage renal disease) ([place])    [place]  . History of echocardiogram    Echo (10/15):  Mod LVH, EF 50-55%, Gr 1 DD, MAC, mild MR, mild TR, PASP 35 mmHg  . Hx of transfusion   . Hyperlipidemia   . Hypertension   . Meningioma ([place])    a. Incidental dx 01/2014.  . Obesity   . Renal insufficiency   . Systolic CHF ([place])    a. 01/1442: dx with mixed ICM/NICM (out of proportion to CAD) EF 25-30% by echo.     Past Surgical History:  Procedure Laterality Date  . ABDOMINAL HYSTERECTOMY    . AV FISTULA PLACEMENT Left 02/10/2017   Procedure: ARTERIOVENOUS (AV) FISTULA CREATION-LEFT FOREARM;  Surgeon: Elam Dutch, MD;  Location: [facility];  Service: Vascular;  Laterality: Left;  . BIOPSY  09/04/2018   Procedure:  BIOPSY;  Surgeon: Jerene Bears, MD;  Location: [facility] ENDOSCOPY;  Service: Gastroenterology;;  . BREAST SURGERY Right    Lumpectomy  . COLONOSCOPY    . COLONOSCOPY WITH PROPOFOL N/A 09/04/2018   Procedure: COLONOSCOPY WITH PROPOFOL;  Surgeon: Jerene Bears, MD;  Location: [facility];  Service: Gastroenterology;  Laterality: N/A;  . ESOPHAGOGASTRODUODENOSCOPY (EGD) WITH PROPOFOL N/A 09/04/2018   Procedure: ESOPHAGOGASTRODUODENOSCOPY (EGD) WITH PROPOFOL;  Surgeon: Jerene Bears, MD;  Location: [facility] ENDOSCOPY;  Service: Gastroenterology;  Laterality: N/A;  . FISTULA SUPERFICIALIZATION Left 08/09/2017   Procedure: FISTULA  SUPERFICIALIZATION LEFT ARM ARTERIOVENOUS FISTULA;  Surgeon: Conrad Greer, MD;  Location: [facility];  Service: Vascular;  Laterality: Left;  . INSERTION OF DIALYSIS CATHETER Right 02/10/2017   Procedure: INSERTION OF DIALYSIS CATHETER;  Surgeon: Elam Dutch, MD;  Location: [facility];  Service: Vascular;  Laterality: Right;  . LEFT HEART CATHETERIZATION WITH CORONARY ANGIOGRAM N/A 01/27/2014   Procedure: LEFT HEART CATHETERIZATION WITH CORONARY ANGIOGRAM;  Surgeon: Jettie Booze, MD;  Location: [facility] CATH LAB;  Service: Cardiovascular;  Laterality: N/A;  . POLYPECTOMY  09/04/2018   Procedure: POLYPECTOMY;  Surgeon: Jerene Bears, MD;  Location: [facility] ENDOSCOPY;  Service: Gastroenterology;;  . TONSILLECTOMY      Social History:  reports that she has quit smoking. Her smoking use included cigarettes. She has a 7.50 pack-year smoking history. She has never used smokeless tobacco. She reports that she does not drink alcohol or use drugs.  Family History:  Family History  Problem Relation Age of Onset  . Diabetic kidney disease Mother   . Hypertension Mother   . Heart failure Mother   . Heart attack Mother   . Hyperlipidemia Mother   . Hypertension Father      Prior to Admission medications   Medication Sig Start Date End Date Taking? Authorizing Provider  acetaminophen (TYLENOL) 325 MG tablet Take 2 tablets (650 mg total) by mouth every 4 (four) hours as needed for mild pain (or temp > 37.5 C (99.5 F)). 11/11/17  Yes [name], MD  aspirin 81 MG EC tablet Take 4 tablets (325 mg total) by mouth daily. Patient taking differently: Take 162 mg by mouth daily.  11/11/17  Yes [name], [name], MD  brimonidine-timolol (COMBIGAN) 0.2-0.5 % ophthalmic solution Place 1 drop into both eyes every 12 (twelve) hours.    Yes [provider]  calcium acetate (PHOSLO) 667 MG capsule Take 1 capsule (667 mg total) by mouth as needed (with snacks). Patient taking differently: Take 667-2,001 mg by mouth See  admin instructions. Take 3 capsules (204m) with a meal three times a day. Take 667 mg with snack 11/11/17  Yes [name], [name] MD  carvedilol (COREG) 25 MG tablet Take 25 mg by mouth 2 (two) times daily. 03/28/17  Yes [provider]  hydrALAZINE (APRESOLINE) 25 MG tablet Take 1 tablet (25 mg total) by mouth every 8 (eight) hours as needed (SBP > 170  or DBP > 110). Patient taking differently: Take 50 mg by mouth 3 (three) times daily.  02/20/17  Yes Mikhail, [name] DO  insulin detemir (LEVEMIR) 100 UNIT/ML injection Inject 0.15 mLs (15 Units total) into the skin at bedtime. Patient taking differently: Inject 30 Units into the skin at bedtime.  02/20/17  Yes Mikhail, [name], DO  latanoprost (XALATAN) 0.005 % ophthalmic solution Place 1 drop into both eyes every evening.   Yes [provider]  Multiple Vitamins-Minerals (RENAPLEX) TABS Take 1 tablet by mouth daily.  07/10/18  Yes [provider]  pantoprazole (PROTONIX) 40 MG tablet Take 1 tablet (40 mg total) by mouth 2 (two) times daily before a meal. Take 1 tablet twice daily for a  Month then once dailly 09/04/18  Yes [name], Vella Redhead, MD  polyethylene glycol (MIRALAX / GLYCOLAX) packet Take 17 g by mouth daily. Patient taking differently: Take 17 g by mouth daily as needed for mild constipation.  02/20/17  Yes Mikhail, [name], DO  timolol (TIMOPTIC) 0.5 % ophthalmic solution Place 1 drop into both eyes daily.   Yes [provider]  atorvastatin (LIPITOR) 40 MG tablet Take 1 tablet (40 mg total) by mouth daily at 6 PM. Patient not taking: Reported on 01/29/2019 07/19/17   Aline August, MD  calcitRIOL (ROCALTROL) 0.5 MCG capsule Take 1 capsule (0.5 mcg total) by mouth Every Tuesday,Thursday,and Saturday with dialysis. Patient not taking: Reported on 01/29/2019 02/21/17   Cristal Ford, DO  multivitamin (RENA-VIT) TABS tablet Take 1 tablet by mouth at bedtime. Patient not taking: Reported on 01/29/2019 02/20/17    Cristal Ford, DO    Physical Exam: Vitals:   01/29/19 2115 01/29/19 2130 01/29/19 2145 01/29/19 2200  BP: (!) 120/50 (!) 142/58  134/60  Pulse: 61 63 60 61  Resp: _0 Temp:      TempSrc:      SpO2: 100% 100% 100% 100%   General: Not in acute distress HEENT:       Eyes: PERRL, EOMI, no scleral icterus.       ENT: No discharge from the ears and nose, no pharynx injection, no tonsillar enlargement.        Neck: No JVD, no bruit, no mass felt. Heme: No neck lymph node enlargement. Cardiac: S1/S2, RRR, No murmurs, No gallops or rubs. Respiratory: has rales, no wheezing, rhonchi or rubs. GI: Soft, nondistended, nontender, no rebound pain, no organomegaly, BS present. GU: No hematuria Ext: has trace leg edema bilaterally. 2+DP/PT pulse bilaterally. Musculoskeletal: No joint deformities, No joint redness or warmth, no limitation of ROM in spin. Skin: No rashes.  Neuro: Alert, oriented X3, cranial nerves II-XII grossly intact, moves all extremities normally.  Psych: Patient is not psychotic, no suicidal or hemocidal ideation.  Labs on Admission: I have personally reviewed following labs and imaging studies  CBC: Recent Labs  Lab 01/29/19 0840  WBC 9.0  NEUTROABS 6.5  HGB 11.5*  HCT 38.3  MCV 101.9*  PLT 834   Basic Metabolic Panel: Recent Labs  Lab 01/29/19 0840  NA 137  K 6.0*  CL 101  CO2 25  GLUCOSE 107*  BUN 52*  CREATININE 6.49*  CALCIUM 8.9   GFR: CrCl cannot be calculated (Unknown ideal weight.). Liver Function Tests: Recent Labs  Lab 01/29/19 0840  AST 27  ALT 15  ALKPHOS 71  BILITOT 0.8  PROT 6.6  ALBUMIN 3.5   No results for input(s): LIPASE, AMYLASE in the last 168 hours. No results for input(s): AMMONIA in the last 168 hours. Coagulation Profile: No results for input(s): INR, PROTIME in the last 168 hours. Cardiac Enzymes: No results for input(s): CKTOTAL, CKMB, CKMBINDEX, TROPONINI in the last 168 hours. BNP (last 3 results)  No results for input(s): PROBNP in the last 8760 hours. HbA1C: No results for input(s): HGBA1C in the last 72 hours. CBG: No results for input(s): GLUCAP in the last 168 hours. Lipid Profile: No results for input(s): CHOL, HDL, LDLCALC, TRIG, CHOLHDL, LDLDIRECT in the last 72 hours.  Thyroid Function Tests: No results for input(s): TSH, T4TOTAL, FREET4, T3FREE, THYROIDAB in the last 72 hours. Anemia Panel: No results for input(s): VITAMINB12, FOLATE, FERRITIN, TIBC, IRON, RETICCTPCT in the last 72 hours. Urine analysis:    Component Value Date/Time   COLORURINE YELLOW 07/13/2017 2335   APPEARANCEUR CLEAR 07/13/2017 2335   LABSPEC 1.009 07/13/2017 2335   PHURINE 6.0 07/13/2017 2335   GLUCOSEU NEGATIVE 07/13/2017 2335   HGBUR NEGATIVE 07/13/2017 2335   [place] NEGATIVE 07/13/2017 2335   [place] NEGATIVE 07/13/2017 2335   PROTEINUR 100 (A) 07/13/2017 2335   UROBILINOGEN 0.2 06/11/2014 0639   NITRITE NEGATIVE 07/13/2017 2335   LEUKOCYTESUR NEGATIVE 07/13/2017 2335   Sepsis Labs: _0 (procalcitonin:4,lacticidven:4) ) Recent Results (from the past 240 hour(s))  SARS Coronavirus 2 (CEPHEID- Performed in [place] hospital lab), Hosp Order     Status: None   Collection Time: 01/29/19  8:43 AM   Specimen: Nasopharyngeal Swab  Result Value Ref Range Status   SARS Coronavirus 2 NEGATIVE NEGATIVE Final    Comment: (NOTE) If result is NEGATIVE SARS-CoV-2 target nucleic acids are NOT DETECTED. The SARS-CoV-2 RNA is generally detectable in upper and lower  respiratory specimens during the acute phase of infection. The lowest  concentration of SARS-CoV-2 viral copies this assay can detect is 250  copies / mL. A negative result does not preclude SARS-CoV-2 infection  and should not be used as the sole basis for treatment or other  patient management decisions.  A negative result may occur with  improper specimen collection / handling, submission of specimen other  than  nasopharyngeal swab, presence of viral mutation(s) within the  areas targeted by this assay, and inadequate number of viral copies  (<250 copies / mL). A negative result must be combined with clinical  observations, patient history, and epidemiological information. If result is POSITIVE SARS-CoV-2 target nucleic acids are DETECTED. The SARS-CoV-2 RNA is generally detectable in upper and lower  respiratory specimens dur ing the acute phase of infection.  Positive  results are indicative of active infection with SARS-CoV-2.  Clinical  correlation with patient history and other diagnostic information is  necessary to determine patient infection status.  Positive results do  not rule out bacterial infection or co-infection with other viruses. If result is PRESUMPTIVE POSTIVE SARS-CoV-2 nucleic acids MAY BE PRESENT.   A presumptive positive result was obtained on the submitted specimen  and confirmed on repeat testing.  While 2019 novel coronavirus  (SARS-CoV-2) nucleic acids may be present in the submitted sample  additional confirmatory testing may be necessary for epidemiological  and / or clinical management purposes  to differentiate between  SARS-CoV-2 and other Sarbecovirus currently known to infect humans.  If clinically indicated additional testing with an alternate test  methodology [phone]) is advised. The SARS-CoV-2 RNA is generally  detectable in upper and lower respiratory sp ecimens during the acute  phase of infection. The expected result is Negative. Fact Sheet for Patients:  StrictlyIdeas.no Fact Sheet for Healthcare Providers: BankingDealers.co.za This test is not yet approved or cleared by the [place] FDA and has been authorized for detection and/or diagnosis of SARS-CoV-2 by FDA under an Emergency Use Authorization (EUA).  This EUA will remain in effect (meaning this test can be used) for the duration of the  COVID-19 declaration under Section 564(b)(1) of the Act, 21 U.S.C. section 360bbb-3(b)(1), unless the authorization is terminated or revoked sooner. Performed at [facility], [place] [address], [place], [place] [address]  Radiological Exams on Admission: Dg Chest Port 1 View  Result Date: 01/29/2019 CLINICAL DATA:  Onset shortness of breath and cough last night. EXAM: PORTABLE CHEST 1 VIEW COMPARISON:  PA and lateral chest 07/14/2017. Single-view of the chest 02/10/2017. FINDINGS: There is cardiomegaly and pulmonary edema. No pneumothorax or pleural fluid. Aortic atherosclerosis noted. No acute or focal bony abnormality. IMPRESSION: Cardiomegaly and pulmonary edema. Electronically Signed   By: Inge Rise M.D.   On: 01/29/2019 08:56     EKG: Independently reviewed.  Sinus rhythm, QTC 428, LAE, anteroseptal infarction pattern, nonspecific T wave change.   Assessment/Plan Principal Problem:   Pulmonary edema Active Problems:   Hypertensive urgency   Hyperlipidemia   ESRD on dialysis (HCC)   CAD (coronary artery disease)   Anemia   Essential hypertension   Anemia in chronic kidney disease   DM (diabetes mellitus), type 2 with renal complications (HCC)   Hyperkalemia   Acute on chronic diastolic CHF (congestive heart failure) (HCC)   Acute respiratory failure with hypoxia (HCC)   Acute respiratory failure with hypoxia due to pulmonary edema and acute on chronic diastolic CHF:  CXR showed cardiomegaly and pulmonary edema.  2D echo on 11/09/2017 showed EF 60-65% with grade 1 diastolic dysfunction.  Renal, Dr. Jonnie Finner was consulted for urgent dialysis.  -will place on tele bed for obs -Continue nasal cannula oxygen to maintain oxygen saturation above 93% -Urgent dialysis per renal. -prn albuterol inhaler  Hypertensive urgency and essential hypertension: Bp 242/132-->172/76 -Continue home medications:Coreg, hydralazine -IV hydralazine prn  Hyperlipidemia: -lipitor   ESRD on dialysis (TTS): Dialysis on Saturday.  Potassium 6.0, creatinine 6.49, BUN 52, bicarbonate 25. -Urgent dialysis per renal  CAD (coronary artery disease): no CP -Continue aspirin, Lipitor, Coreg  Anemia in chronic kidney disease (ESRD): Hgb stable, 11.5 -f/u by [facility]  DM (diabetes mellitus), type 2 with renal complications ([place]): Last A1c 9.1 on 09/01/18, poorly controled. Patient is taking Levemir at home -will decrease Levemir dose from 30 to 15 unit daily -SSI  Hyperkalemia: K 6.0. T wave peaking on EKG. -Give 1 dose of Kayexalate 30 g -give 5 U of novolog and D50    DVT ppx: SQ Heparin     Code Status: Full code Family Communication: None at bed side.    Disposition Plan:  Anticipate discharge back to previous home environment Consults called:  Dr. Jonnie Finner Admission status: Obs / tele  Inpatient/tele   medical floor/obs     SDU/inpation       Date of Service 01/29/2019    [place] [facility]   If 7PM-7AM, please contact night-coverage [email or web address] Password [facility] 01/29/2019, 10:41 PM

## 2019-01-29 NOTE — ED Provider Notes (Signed)
[facility] EMERGENCY DEPARTMENT Provider Note   CSN: [id number] Arrival date & time: 01/29/19  0759    History   Chief Complaint Chief Complaint  Patient presents with  . Shortness of Breath  . Cough    HPI Kathleen [name] is a 73 y.o. female.     73 yo F with a chief complaint of cough and shortness of breath.  She thinks that this started last night.  No sick contacts.  No fevers.  Nonproductive.  Found to be hypoxic by EMS with an oxygen saturation in the 80s on arrival.  She denies nausea vomiting or diarrhea denies abdominal pain.  Denies chest pain.  States she is been compliant with her dialysis.  Goes Tuesday Thursday and Saturday.  The history is provided by the patient.  Shortness of Breath Severity:  Moderate Onset quality:  Gradual Duration:  1 day Timing:  Constant Progression:  Worsening Chronicity:  New Relieved by:  Nothing Worsened by:  Nothing Ineffective treatments:  None tried Associated symptoms: cough   Associated symptoms: no chest pain, no fever, no headaches, no vomiting and no wheezing   Cough Associated symptoms: shortness of breath   Associated symptoms: no chest pain, no chills, no fever, no headaches, no myalgias, no rhinorrhea and no wheezing     Past Medical History:  Diagnosis Date  . Anemia    a. 01/2014: suspected of chronic disease, negative FOBT.  [name]    knees  . Asthma    many years ago  . CAD (coronary artery disease)    a. Cath 01/2014: mild in LAD, mild-mod LCx, mod-severe RCA - for med rx.  . Diabetes mellitus without complication ([place])   . ESRD (end stage renal disease) ([place])    [place]  . History of echocardiogram    Echo (10/15):  Mod LVH, EF 50-55%, Gr 1 DD, MAC, mild MR, mild TR, PASP 35 mmHg  . Hx of transfusion   . Hyperlipidemia   . Hypertension   . Meningioma ([place])    a. Incidental dx 01/2014.  . Obesity   . Renal insufficiency   . Systolic CHF ([place])    a.  01/2014: dx with mixed ICM/NICM (out of proportion to CAD) EF 25-30% by echo.     Patient Active Problem List   Diagnosis Date Noted  . Pulmonary edema 01/29/2019  . Hematochezia   . Benign neoplasm of cecum   . Benign neoplasm of ascending colon   . Benign neoplasm of sigmoid colon   . Gastritis and gastroduodenitis   . Rectal bleeding 09/01/2018  . CVA (cerebral vascular accident) ([place]) 11/09/2017  . Acute CVA (cerebrovascular accident) ([place]) 11/08/2017  . DM (diabetes mellitus), type 2 with renal complications ([place]) 41/32/4401  . Altered mental status 07/14/2017  . Knee pain   . Acute lower UTI   . Peripheral arterial disease ([place]) 02/23/2016  . Insulin dependent diabetes mellitus ([place]) 12/18/2015  . Anemia in chronic kidney disease 12/18/2015  . Dyspnea   . Goals of care, counseling/discussion   . SOB (shortness of breath)   . Encounter for palliative care   . Pulmonary vascular congestion   . Pleural effusion, left   . HLD (hyperlipidemia)   . Noncompliance with medication regimen   . Disorientation   . Somnolence   . Acute respiratory failure with hypercapnia ([place])   . Respiratory acidosis   . Hypernatremia   . Systolic and diastolic CHF, acute ([place])   .  CHF, acute on chronic ([place]) 08/05/2015  . Elevated troponin 08/05/2015  . CHF (congestive heart failure) ([place]) 08/05/2015  . Acute on chronic heart failure ([place]) 08/05/2015  . Acute on chronic combined systolic and diastolic congestive heart failure ([place])   . Left carotid bruit 11/20/2014  . Syncope and collapse 06/10/2014  . Syncope 06/10/2014  . Essential hypertension   . Unspecified vitamin D deficiency   . Cellulitis and abscess of right lower extremitiy 02/28/2014  . Chronic combined systolic and diastolic heart failure ([place]) 02/12/2014  . CAD (coronary artery disease)   . Obesity   . Meningioma ([place])   . Anemia   . ESRD on dialysis ([place]) 01/24/2014  . Hyperlipidemia 01/22/2014  . BPPV (benign paroxysmal  positional vertigo) 01/22/2014  . Hypertensive urgency 01/21/2014  . Tobacco abuse 01/21/2014  . Diabetes mellitus due to abnormal insulin ([place]) 01/21/2014    Past Surgical History:  Procedure Laterality Date  . ABDOMINAL HYSTERECTOMY    . AV FISTULA PLACEMENT Left 02/10/2017   Procedure: ARTERIOVENOUS (AV) FISTULA CREATION-LEFT FOREARM;  Surgeon: Elam Dutch, MD;  Location: [facility];  Service: Vascular;  Laterality: Left;  . BIOPSY  09/04/2018   Procedure: BIOPSY;  Surgeon: Jerene Bears, MD;  Location: [facility] ENDOSCOPY;  Service: Gastroenterology;;  . BREAST SURGERY Right    Lumpectomy  . COLONOSCOPY    . COLONOSCOPY WITH PROPOFOL N/A 09/04/2018   Procedure: COLONOSCOPY WITH PROPOFOL;  Surgeon: Jerene Bears, MD;  Location: [facility];  Service: Gastroenterology;  Laterality: N/A;  . ESOPHAGOGASTRODUODENOSCOPY (EGD) WITH PROPOFOL N/A 09/04/2018   Procedure: ESOPHAGOGASTRODUODENOSCOPY (EGD) WITH PROPOFOL;  Surgeon: Jerene Bears, MD;  Location[facility] ENDOSCOPY;  Service: Gastroenterology;  Laterality: N/A;  . FISTULA SUPERFICIALIZATION Left 08/09/2017   Procedure: FISTULA SUPERFICIALIZATION LEFT ARM ARTERIOVENOUS FISTULA;  Surgeon: Conrad Meraux, MD;  Location: [facility];  Service: Vascular;  Laterality: Left;  . INSERTION OF DIALYSIS CATHETER Right 02/10/2017   Procedure: INSERTION OF DIALYSIS CATHETER;  Surgeon: Elam Dutch, MD;  Location: [facility];  Service: Vascular;  Laterality: Right;  . LEFT HEART CATHETERIZATION WITH CORONARY ANGIOGRAM N/A 01/27/2014   Procedure: LEFT HEART CATHETERIZATION WITH CORONARY ANGIOGRAM;  Surgeon: Jettie Booze, MD;  Location: [facility] CATH LAB;  Service: Cardiovascular;  Laterality: N/A;  . POLYPECTOMY  09/04/2018   Procedure: POLYPECTOMY;  Surgeon: Jerene Bears, MD;  Location: [facility] ENDOSCOPY;  Service: Gastroenterology;;  . TONSILLECTOMY       OB History   No obstetric history on file.      Home Medications    Prior to Admission medications   Medication  Sig Start Date End Date Taking? Authorizing Provider  acetaminophen (TYLENOL) 325 MG tablet Take 2 tablets (650 mg total) by mouth every 4 (four) hours as needed for mild pain (or temp > 37.5 C (99.5 F)). 11/11/17  Yes [name], MD  aspirin 81 MG EC tablet Take 4 tablets (325 mg total) by mouth daily. Patient taking differently: Take 162 mg by mouth daily.  11/11/17  Yes [name], [name], MD  brimonidine-timolol (COMBIGAN) 0.2-0.5 % ophthalmic solution Place 1 drop into both eyes every 12 (twelve) hours.    Yes [provider]  calcium acetate (PHOSLO) 667 MG capsule Take 1 capsule (667 mg total) by mouth as needed (with snacks). Patient taking differently: Take 667-2,001 mg by mouth See admin instructions. Take 3 capsules (2072m) with a meal three times a day. Take 667 mg with snack 11/11/17  Yes [name], SHolland  MD  carvedilol (COREG) 25 MG tablet Take 25 mg by mouth 2 (two) times daily. 03/28/17  Yes [provider]  hydrALAZINE (APRESOLINE) 25 MG tablet Take 1 tablet (25 mg total) by mouth every 8 (eight) hours as needed (SBP > 170  or DBP > 110). Patient taking differently: Take 50 mg by mouth 3 (three) times daily.  02/20/17  Yes Mikhail, Velta Addison, DO  insulin detemir (LEVEMIR) 100 UNIT/ML injection Inject 0.15 mLs (15 Units total) into the skin at bedtime. Patient taking differently: Inject 30 Units into the skin at bedtime.  02/20/17  Yes Mikhail, [name], DO  latanoprost (XALATAN) 0.005 % ophthalmic solution Place 1 drop into both eyes every evening.   Yes [provider]  Multiple Vitamins-Minerals (RENAPLEX) TABS Take 1 tablet by mouth daily. 07/10/18  Yes [provider]  pantoprazole (PROTONIX) 40 MG tablet Take 1 tablet (40 mg total) by mouth 2 (two) times daily before a meal. Take 1 tablet twice daily for a  Month then once dailly 09/04/18  Yes [name], Vella Redhead, MD  polyethylene glycol (MIRALAX / GLYCOLAX) packet Take 17 g by mouth daily. Patient taking  differently: Take 17 g by mouth daily as needed for mild constipation.  02/20/17  Yes Mikhail, [name], DO  timolol (TIMOPTIC) 0.5 % ophthalmic solution Place 1 drop into both eyes daily.   Yes [provider]  atorvastatin (LIPITOR) 40 MG tablet Take 1 tablet (40 mg total) by mouth daily at 6 PM. Patient not taking: Reported on 01/29/2019 07/19/17   Aline August, MD  calcitRIOL (ROCALTROL) 0.5 MCG capsule Take 1 capsule (0.5 mcg total) by mouth Every Tuesday,Thursday,and Saturday with dialysis. Patient not taking: Reported on 01/29/2019 02/21/17   Cristal Ford, DO  multivitamin (RENA-VIT) TABS tablet Take 1 tablet by mouth at bedtime. Patient not taking: Reported on 01/29/2019 02/20/17   Cristal Ford, DO    Family History Family History  Problem Relation Age of Onset  . Diabetic kidney disease Mother   . Hypertension Mother   . Heart failure Mother   . Heart attack Mother   . Hyperlipidemia Mother   . Hypertension Father     Social History Social History   Tobacco Use  . Smoking status: Former Smoker    Packs/day: 0.25    Years: 30.00    Pack years: 7.50    Types: Cigarettes  . Smokeless tobacco: Never Used  . Tobacco comment: quit late 1990's  Substance Use Topics  . Alcohol use: No  . Drug use: No     Allergies   Baclofen   Review of Systems Review of Systems  Constitutional: Negative for chills and fever.  HENT: Negative for congestion and rhinorrhea.   Eyes: Negative for redness and visual disturbance.  Respiratory: Positive for cough and shortness of breath. Negative for wheezing.   Cardiovascular: Negative for chest pain and palpitations.  Gastrointestinal: Negative for nausea and vomiting.  Genitourinary: Negative for dysuria and urgency.  Musculoskeletal: Negative for arthralgias and myalgias.  Skin: Negative for pallor and wound.  Neurological: Negative for dizziness and headaches.     Physical Exam Updated Vital Signs BP (!) 155/79    Pulse 66   Temp 98.5 F (36.9 C) (Oral)   Resp (!) 22   LMP  (LMP Unknown)   SpO2 100%   Physical Exam Vitals signs and nursing note reviewed.  Constitutional:      General: She is not in acute distress.    Appearance: She is  well-developed. She is obese. She is not diaphoretic.  HENT:     Head: Normocephalic and atraumatic.  Eyes:     Pupils: Pupils are equal, round, and reactive to light.  Neck:     Musculoskeletal: Normal range of motion and neck supple.  Cardiovascular:     Rate and Rhythm: Normal rate and regular rhythm.     Heart sounds: No murmur. No friction rub. No gallop.   Pulmonary:     Effort: Pulmonary effort is normal.     Breath sounds: Decreased breath sounds (in all fields) present. No wheezing or rales.  Abdominal:     General: There is no distension.     Palpations: Abdomen is soft.     Tenderness: There is no abdominal tenderness.  Musculoskeletal:        General: No tenderness.  Skin:    General: Skin is warm and dry.  Neurological:     Mental Status: She is alert and oriented to person, place, and time.  Psychiatric:        Behavior: Behavior normal.      ED Treatments / Results  Labs (all labs ordered are listed, but only abnormal results are displayed) Labs Reviewed  CBC WITH DIFFERENTIAL/PLATELET - Abnormal; Notable for the following components:      Result Value   RBC 3.76 (*)    Hemoglobin 11.5 (*)    MCV 101.9 (*)    RDW 15.6 (*)    Eosinophils Absolute 0.7 (*)    All other components within normal limits  COMPREHENSIVE METABOLIC PANEL - Abnormal; Notable for the following components:   Potassium 6.0 (*)    Glucose, Bld 107 (*)    BUN 52 (*)    Creatinine, Ser 6.49 (*)    GFR calc non Af Amer 6 (*)    GFR calc Af Amer 7 (*)    All other components within normal limits  BRAIN NATRIURETIC PEPTIDE - Abnormal; Notable for the following components:   B Natriuretic Peptide 1,852.6 (*)    All other components within normal limits   SARS CORONAVIRUS 2 (HOSPITAL ORDER, [place] LAB)    EKG EKG Interpretation  Date/Time:  Tuesday January 29 2019 08:04:40 EDT Ventricular Rate:  102 PR Interval:    QRS Duration: 91 QT Interval:  328 QTC Calculation: 428 R Axis:   54 Text Interpretation:  Sinus tachycardia Probable anteroseptal infarct, old Baseline wander in lead(s) I III aVL V5 V6 Since last tracing rate faster Confirmed by Deno Etienne [phone]) on 01/29/2019 8:16:56 AM   Radiology Dg Chest Port 1 View  Result Date: 01/29/2019 CLINICAL DATA:  Onset shortness of breath and cough last night. EXAM: PORTABLE CHEST 1 VIEW COMPARISON:  PA and lateral chest 07/14/2017. Single-view of the chest 02/10/2017. FINDINGS: There is cardiomegaly and pulmonary edema. No pneumothorax or pleural fluid. Aortic atherosclerosis noted. No acute or focal bony abnormality. IMPRESSION: Cardiomegaly and pulmonary edema. Electronically Signed   By: Inge Rise M.D.   On: 01/29/2019 08:56    Procedures Procedures (including critical care time)  Medications Ordered in ED Medications  Chlorhexidine Gluconate Cloth 2 % PADS 6 each (has no administration in time range)  chlorpheniramine-HYDROcodone (TUSSIONEX) 10-8 MG/5ML suspension 5 mL (5 mLs Oral Given 01/29/19 0831)  carvedilol (COREG) tablet 25 mg (25 mg Oral Given 01/29/19 0829)  hydrALAZINE (APRESOLINE) tablet 25 mg (25 mg Oral Given 01/29/19 0830)  albuterol (VENTOLIN HFA) 108 (90 Base) MCG/ACT inhaler 2 puff (  2 puffs Inhalation Given 01/29/19 0829)     Initial Impression / Assessment and Plan / ED Course  I have reviewed the triage vital signs and the nursing notes.  Pertinent labs & imaging results that were available during my care of the patient were reviewed by me and considered in my medical decision making (see chart for details).        73 yo F with a chief complaints of cough and shortness of breath.  This started last night.  Patient is coughing  fairly constantly on my arrival into the room.  She is requiring 2 to 3 L of oxygen and does not use oxygen at home.  We will give cough medicine chest x-ray lab work placed on airborne precautions with the current pandemic.  Cough resolved with cough syrup and albuterol.  Chest x-ray with likely fluid overload, BNP is also elevated above her apparent baseline.  Will discuss with nephrology.  I discussed the case with Dr. Jonnie Finner, nephrology.  He independently evaluated her x-ray and lab work and will attempt to dialyze her and reassess for improvement of her symptoms if that is okay she would likely be discharged from the dialysis suite.  CRITICAL CARE Performed by: Cecilio Asper   Total critical care time: 35 minutes  Critical care time was exclusive of separately billable procedures and treating other patients.  Critical care was necessary to treat or prevent imminent or life-threatening deterioration.  Critical care was time spent personally by me on the following activities: development of treatment plan with patient and/or surrogate as well as nursing, discussions with consultants, evaluation of patient's response to treatment, examination of patient, obtaining history from patient or surrogate, ordering and performing treatments and interventions, ordering and review of laboratory studies, ordering and review of radiographic studies, pulse oximetry and re-evaluation of patient's condition.  The patients results and plan were reviewed and discussed.   Any x-rays performed were independently reviewed by myself.   Differential diagnosis were considered with the presenting HPI.  Medications  Chlorhexidine Gluconate Cloth 2 % PADS 6 each (has no administration in time range)  chlorpheniramine-HYDROcodone (TUSSIONEX) 10-8 MG/5ML suspension 5 mL (5 mLs Oral Given 01/29/19 0831)  carvedilol (COREG) tablet 25 mg (25 mg Oral Given 01/29/19 0829)  hydrALAZINE (APRESOLINE) tablet 25 mg (25  mg Oral Given 01/29/19 0830)  albuterol (VENTOLIN HFA) 108 (90 Base) MCG/ACT inhaler 2 puff (2 puffs Inhalation Given 01/29/19 0829)    Vitals:   01/29/19 1030 01/29/19 1145 01/29/19 1300 01/29/19 1345  BP: (!) 133/59 (!) 125/57 (!) 160/70 (!) 155/79  Pulse: (!) 57 62 60 66  Resp: _0 (!) 22  Temp:      TempSrc:      SpO2: 98% 99% 98% 100%    Final diagnoses:  Acute pulmonary edema (HCC)  Cough    Admission/ observation were discussed with the admitting physician, patient and/or family and they are comfortable with the plan.   Final Clinical Impressions(s) / ED Diagnoses   Final diagnoses:  Acute pulmonary edema ([place])  Cough    ED Discharge Orders    None       Deno Etienne, DO 01/29/19 1519

## 2019-01-30 ENCOUNTER — Observation Stay (HOSPITAL_COMMUNITY): Payer: Medicare Other

## 2019-01-30 DIAGNOSIS — M170 Bilateral primary osteoarthritis of knee: Secondary | ICD-10-CM | POA: Diagnosis present

## 2019-01-30 DIAGNOSIS — D631 Anemia in chronic kidney disease: Secondary | ICD-10-CM | POA: Diagnosis present

## 2019-01-30 DIAGNOSIS — I255 Ischemic cardiomyopathy: Secondary | ICD-10-CM | POA: Diagnosis present

## 2019-01-30 DIAGNOSIS — J810 Acute pulmonary edema: Secondary | ICD-10-CM | POA: Diagnosis not present

## 2019-01-30 DIAGNOSIS — E1121 Type 2 diabetes mellitus with diabetic nephropathy: Secondary | ICD-10-CM | POA: Diagnosis not present

## 2019-01-30 DIAGNOSIS — Z841 Family history of disorders of kidney and ureter: Secondary | ICD-10-CM | POA: Diagnosis not present

## 2019-01-30 DIAGNOSIS — E8770 Fluid overload, unspecified: Secondary | ICD-10-CM | POA: Diagnosis not present

## 2019-01-30 DIAGNOSIS — Z992 Dependence on renal dialysis: Secondary | ICD-10-CM | POA: Diagnosis not present

## 2019-01-30 DIAGNOSIS — E785 Hyperlipidemia, unspecified: Secondary | ICD-10-CM | POA: Diagnosis present

## 2019-01-30 DIAGNOSIS — Z1159 Encounter for screening for other viral diseases: Secondary | ICD-10-CM | POA: Diagnosis not present

## 2019-01-30 DIAGNOSIS — I160 Hypertensive urgency: Secondary | ICD-10-CM | POA: Diagnosis present

## 2019-01-30 DIAGNOSIS — I132 Hypertensive heart and chronic kidney disease with heart failure and with stage 5 chronic kidney disease, or end stage renal disease: Secondary | ICD-10-CM | POA: Diagnosis present

## 2019-01-30 DIAGNOSIS — I5033 Acute on chronic diastolic (congestive) heart failure: Secondary | ICD-10-CM

## 2019-01-30 DIAGNOSIS — J45909 Unspecified asthma, uncomplicated: Secondary | ICD-10-CM | POA: Diagnosis present

## 2019-01-30 DIAGNOSIS — N186 End stage renal disease: Secondary | ICD-10-CM | POA: Diagnosis not present

## 2019-01-30 DIAGNOSIS — Z87891 Personal history of nicotine dependence: Secondary | ICD-10-CM | POA: Diagnosis not present

## 2019-01-30 DIAGNOSIS — Z86011 Personal history of benign neoplasm of the brain: Secondary | ICD-10-CM | POA: Diagnosis not present

## 2019-01-30 DIAGNOSIS — Z6834 Body mass index (BMI) 34.0-34.9, adult: Secondary | ICD-10-CM | POA: Diagnosis not present

## 2019-01-30 DIAGNOSIS — J9601 Acute respiratory failure with hypoxia: Secondary | ICD-10-CM | POA: Diagnosis present

## 2019-01-30 DIAGNOSIS — E875 Hyperkalemia: Secondary | ICD-10-CM | POA: Diagnosis present

## 2019-01-30 DIAGNOSIS — Z8673 Personal history of transient ischemic attack (TIA), and cerebral infarction without residual deficits: Secondary | ICD-10-CM | POA: Diagnosis not present

## 2019-01-30 DIAGNOSIS — E8889 Other specified metabolic disorders: Secondary | ICD-10-CM | POA: Diagnosis present

## 2019-01-30 DIAGNOSIS — I5043 Acute on chronic combined systolic (congestive) and diastolic (congestive) heart failure: Secondary | ICD-10-CM | POA: Diagnosis present

## 2019-01-30 DIAGNOSIS — R05 Cough: Secondary | ICD-10-CM | POA: Diagnosis not present

## 2019-01-30 DIAGNOSIS — E669 Obesity, unspecified: Secondary | ICD-10-CM | POA: Diagnosis present

## 2019-01-30 DIAGNOSIS — I2510 Atherosclerotic heart disease of native coronary artery without angina pectoris: Secondary | ICD-10-CM | POA: Diagnosis present

## 2019-01-30 DIAGNOSIS — N189 Chronic kidney disease, unspecified: Secondary | ICD-10-CM | POA: Diagnosis not present

## 2019-01-30 DIAGNOSIS — Z90710 Acquired absence of both cervix and uterus: Secondary | ICD-10-CM | POA: Diagnosis not present

## 2019-01-30 DIAGNOSIS — E1122 Type 2 diabetes mellitus with diabetic chronic kidney disease: Secondary | ICD-10-CM | POA: Diagnosis present

## 2019-01-30 DIAGNOSIS — R0602 Shortness of breath: Secondary | ICD-10-CM | POA: Diagnosis not present

## 2019-01-30 DIAGNOSIS — I120 Hypertensive chronic kidney disease with stage 5 chronic kidney disease or end stage renal disease: Secondary | ICD-10-CM | POA: Diagnosis not present

## 2019-01-30 DIAGNOSIS — I428 Other cardiomyopathies: Secondary | ICD-10-CM | POA: Diagnosis present

## 2019-01-30 LAB — CBC
HCT: 39.9 % (ref 36.0–46.0)
Hemoglobin: 11.9 g/dL — ABNORMAL LOW (ref 12.0–15.0)
MCH: 30.7 pg (ref 26.0–34.0)
MCHC: 29.8 g/dL — ABNORMAL LOW (ref 30.0–36.0)
MCV: 103.1 fL — ABNORMAL HIGH (ref 80.0–100.0)
Platelets: 181 10*3/uL (ref 150–400)
RBC: 3.87 MIL/uL (ref 3.87–5.11)
RDW: 15.6 % — ABNORMAL HIGH (ref 11.5–15.5)
WBC: 5.3 10*3/uL (ref 4.0–10.5)
nRBC: 0 % (ref 0.0–0.2)

## 2019-01-30 LAB — BASIC METABOLIC PANEL
Anion gap: 11 (ref 5–15)
BUN: 38 mg/dL — ABNORMAL HIGH (ref 8–23)
CO2: 26 mmol/L (ref 22–32)
Calcium: 8.6 mg/dL — ABNORMAL LOW (ref 8.9–10.3)
Chloride: 99 mmol/L (ref 98–111)
Creatinine, Ser: 5.13 mg/dL — ABNORMAL HIGH (ref 0.44–1.00)
GFR calc Af Amer: 9 mL/min — ABNORMAL LOW (ref >60)
GFR calc non Af Amer: 8 mL/min — ABNORMAL LOW (ref >60)
Glucose, Bld: 187 mg/dL — ABNORMAL HIGH (ref 70–99)
Potassium: 5.4 mmol/L — ABNORMAL HIGH (ref 3.5–5.1)
Sodium: 136 mmol/L (ref 135–145)

## 2019-01-30 LAB — GLUCOSE, CAPILLARY
Glucose-Capillary: 149 mg/dL — ABNORMAL HIGH (ref 70–99)
Glucose-Capillary: 178 mg/dL — ABNORMAL HIGH (ref 70–99)
Glucose-Capillary: 187 mg/dL — ABNORMAL HIGH (ref 70–99)
Glucose-Capillary: 153 mg/dL — ABNORMAL HIGH (ref 70–99)
Glucose-Capillary: 133 mg/dL — ABNORMAL HIGH (ref 70–99)
Glucose-Capillary: 93 mg/dL (ref 70–99)

## 2019-01-30 LAB — CBG MONITORING, ED
Glucose-Capillary: 119 mg/dL — ABNORMAL HIGH (ref 70–99)
Glucose-Capillary: 61 mg/dL — ABNORMAL LOW (ref 70–99)

## 2019-01-30 MED ORDER — INSULIN DETEMIR 100 UNIT/ML ~~LOC~~ SOLN
15.0000 [IU] | Freq: Every day | SUBCUTANEOUS | Status: DC
  Administered 2019-01-30 – 2019-02-01 (×3): 15 [IU] via SUBCUTANEOUS
  Filled 2019-01-30 (×3): qty 0.15

## 2019-01-30 MED ORDER — LORAZEPAM 0.5 MG PO TABS
ORAL_TABLET | ORAL | Status: AC
  Filled 2019-01-30: qty 2

## 2019-01-30 MED ORDER — PATIROMER SORBITEX CALCIUM 8.4 G PO PACK
8.4000 g | PACK | Freq: Every day | ORAL | Status: DC
  Administered 2019-01-30 – 2019-01-31 (×2): 8.4 g via ORAL
  Filled 2019-01-30 (×5): qty 1

## 2019-01-30 MED ORDER — INSULIN ASPART 100 UNIT/ML ~~LOC~~ SOLN
0.0000 [IU] | Freq: Three times a day (TID) | SUBCUTANEOUS | Status: DC
  Administered 2019-01-30: 1 [IU] via SUBCUTANEOUS
  Administered 2019-01-30 (×2): 2 [IU] via SUBCUTANEOUS
  Administered 2019-01-31 – 2019-02-01 (×2): 3 [IU] via SUBCUTANEOUS

## 2019-01-30 MED ORDER — HEPARIN SODIUM (PORCINE) 5000 UNIT/ML IJ SOLN
5000.0000 [IU] | Freq: Three times a day (TID) | INTRAMUSCULAR | Status: DC
  Administered 2019-01-30 – 2019-02-01 (×7): 5000 [IU] via SUBCUTANEOUS
  Filled 2019-01-30 (×7): qty 1

## 2019-01-30 MED ORDER — ONDANSETRON HCL 4 MG PO TABS: 4.0000 mg | ORAL_TABLET | Freq: Four times a day (QID) | ORAL | Status: DC | PRN

## 2019-01-30 MED ORDER — ONDANSETRON HCL 4 MG/2ML IJ SOLN: 4.0000 mg | Freq: Four times a day (QID) | INTRAMUSCULAR | Status: DC | PRN

## 2019-01-30 NOTE — Progress Notes (Signed)
Dr. Broadus John made aware of patient having left arm numbness and tingling and being short of breath while coughing. Chest xray has been ordered.

## 2019-01-30 NOTE — Progress Notes (Addendum)
  [facility] Progress Note   Subjective:  Seen in room, sleepy, waking up to eat breakfast. Off O2 overnight. Feels like breathing improved.   Objective Vitals:   01/30/19 0400 01/30/19 0410 01/30/19 0513 01/30/19 0631  BP: (!) 141/64 (!) 158/70 (!) 169/58 (!) 141/59  Pulse: 79 76 75 69  Resp: 16 16 16    Temp:  97.7 F (36.5 C) 98.7 F (37.1 C)   TempSrc:  Axillary Oral   SpO2: 100% 100% 100%     Physical Exam General: WNWD female NAD  Heart: RRR Lungs: CTAB  Abdomen: soft NTND Extremities: Trace LE edema  Dialysis Access: L AVF +bruit   Weight change:    Additional Objective Labs: Basic Metabolic Panel: Recent Labs  Lab 01/29/19 0840 01/30/19 0653  NA 137 136  K 6.0* 5.4*  CL 101 99  CO2 25 26  GLUCOSE 107* 187*  BUN 52* 38*  CREATININE 6.49* 5.13*  CALCIUM 8.9 8.6*   CBC: Recent Labs  Lab 01/29/19 0840 01/30/19 0653  WBC 9.0 5.3  NEUTROABS 6.5  --   HGB 11.5* 11.9*  HCT 38.3 39.9  MCV 101.9* 103.1*  PLT 236 181   Blood Culture    Component Value Date/Time   SDES URINE, RANDOM 02/08/2017 1351   SPECREQUEST NONE 02/08/2017 1351   CULT >=100,000 COLONIES/mL ESCHERICHIA COLI (A) 02/08/2017 1351   REPTSTATUS 02/11/2017 FINAL 02/08/2017 1351     Medications:  . aspirin EC  162 mg Oral Daily  . atorvastatin  40 mg Oral q1800  . timolol  1 drop Both Eyes BID   And  . brimonidine  1 drop Both Eyes BID  . [START ON 01/31/2019] calcitRIOL  0.5 mcg Oral Q T,Th,Sa-HD  . calcium acetate  2,001 mg Oral TID WC  . carvedilol  25 mg Oral BID WC  . Chlorhexidine Gluconate Cloth  6 each Topical Q0600  . dextrose  50 mL Intravenous Once  . heparin  5,000 Units Subcutaneous Q8H  . hydrALAZINE  50 mg Oral Q8H  . insulin aspart  0-9 Units Subcutaneous TID WC  . insulin detemir  15 Units Subcutaneous Daily  . latanoprost  1 drop Both Eyes QPM  . LORazepam      . multivitamin  1 tablet Oral QHS  . pantoprazole  40 mg Oral BID AC  .  patiromer  8.4 g Oral Daily    Dialysis Orders:  TTS [place]  4h   86kg   2/2.25 bath  LUA AVF  Hep none  Assessment/Plan: 1. Acute pulm edema/volume overload. Improved today. Off supp O2. Had urgent HD yesterday. Net UF 2.1L No weights recorded (will check today)  but will plan gradual lowering of EDW as outpatient.  2. ESRD - HD TTS K 5.4. Next HD 6/25 at outpatient center.  3. HTN - BP stable. On hydralazine tid.  4. Anemia- No ESA needs currently  5. Metabolic Bone Disease- Continue usual meds  6. dCHF  7. DM Type 2 Dispo: Tentative plan for d/c today    Lynnda Child PA-C [place] Pager [phone] 01/30/2019,11:15 AM  LOS: 0 days   Pt seen, examined and agree w A/P as above.  [name]  MD 01/30/2019, 3:55 PM

## 2019-01-30 NOTE — ED Notes (Signed)
Spoke to [place] with patient placement who is working on getting patient a bed.

## 2019-01-30 NOTE — Progress Notes (Signed)
Spoke with patient's son [name] and updated him on his mom.

## 2019-01-30 NOTE — Progress Notes (Signed)
PROGRESS NOTE    Kathleen [name]  ESP:[id number] DOB: 03-23-1946 DOA: 01/29/2019 PCP: Benito Mccreedy, MD  Brief Narrative: Obese 73 year old female with history of ESRD on hemodialysis TTS, type 2 diabetes mellitus, hypertension presented to the emergency room overnight with acute onset of dyspnea, chest x-ray noted pulmonary edema. -Patient denied any fever productive cough chest pain nausea or vomiting -Nephrology consulted, underwent urgent HD early this morning   Assessment & Plan:   Acute hypoxic respiratory failure/pulmonary edema -Likely needs lowering of dry weight, reports that she has lost some weight recently -Status post urgent dialysis early this morning status post 2 L UF, attempt to wean O2, appears still slightly fluid overloaded, no fevers or leukocytosis -Discussed with nephrology plan for HD again tomorrow with goal to lower dry weight some more -Educated about diet, fluid restrictions  Hyperkalemia -Potassium was 6.0 last night, this morning at 5.4 after dialysis -Veltassa now  ESRD on hemodialysis Tuesday Thursday Saturday -With pulmonary edema as above  Type 2 diabetes mellitus -Continue Levemir at lower dose 15 units daily, last A1c was 9.1 in 08/2018  Anemia of chronic disease -Stable  Essential hypertension -Continue Coreg and hydralazine  DVT prophylaxis: Cutaneous heparin Code Status: Full code Family Communication: No family at bedside Disposition Plan: Home tomorrow if improved  Consultants:   Renal   Procedures:   Antimicrobials:    Subjective: -Breathing better still tired, reports dry nonproductive cough, no fevers or chills  Objective: Vitals:   01/30/19 1122 01/30/19 1225 01/30/19 1240 01/30/19 1248  BP:  (!) 116/50    Pulse:  66    Resp:  17    Temp:  98.1 F (36.7 C)    TempSrc:  Oral    SpO2: 93% (!) 89% (!) 88% 91%    Intake/Output Summary (Last 24 hours) at 01/30/2019 1403 Last data filed at 01/30/2019 1100  Gross per 24 hour  Intake 210 ml  Output 2100 ml  Net -1890 ml   Filed Weights    Examination:  General exam: Obese female, somnolent easily arousable, oriented x3 Respiratory system: Decreased breath sounds at both bases Cardiovascular system: S1 & S2 heard, RRR. No JVD, murmurs, rubs, gallops Gastrointestinal system: Abdomen is nondistended, soft and nontender.Normal bowel sounds heard. Central nervous system: Moves all extremities, no localizing signs. Extremities: Trace edema. Skin: No rashes, lesions or ulcers Psychiatry: Judgement and insight appear normal. Mood & affect appropriate.     Data Reviewed:   CBC: Recent Labs  Lab 01/29/19 0840 01/30/19 0653  WBC 9.0 5.3  NEUTROABS 6.5  --   HGB 11.5* 11.9*  HCT 38.3 39.9  MCV 101.9* 103.1*  PLT 236 633   Basic Metabolic Panel: Recent Labs  Lab 01/29/19 0840 01/30/19 0653  NA 137 136  K 6.0* 5.4*  CL 101 99  CO2 25 26  GLUCOSE 107* 187*  BUN 52* 38*  CREATININE 6.49* 5.13*  CALCIUM 8.9 8.6*   GFR: CrCl cannot be calculated (Unknown ideal weight.). Liver Function Tests: Recent Labs  Lab 01/29/19 0840  AST 27  ALT 15  ALKPHOS 71  BILITOT 0.8  PROT 6.6  ALBUMIN 3.5   No results for input(s): LIPASE, AMYLASE in the last 168 hours. No results for input(s): AMMONIA in the last 168 hours. Coagulation Profile: No results for input(s): INR, PROTIME in the last 168 hours. Cardiac Enzymes: No results for input(s): CKTOTAL, CKMB, CKMBINDEX, TROPONINI in the last 168 hours. BNP (last 3 results) No results for input(s):  PROBNP in the last 8760 hours. HbA1C: No results for input(s): HGBA1C in the last 72 hours. CBG: Recent Labs  Lab 01/30/19 0031 01/30/19 0245 01/30/19 0656 01/30/19 0804 01/30/19 1155  GLUCAP 119* 149* 178* 187* 153*   Lipid Profile: No results for input(s): CHOL, HDL, LDLCALC, TRIG, CHOLHDL, LDLDIRECT in the last 72 hours. Thyroid Function Tests: No results for input(s): TSH,  T4TOTAL, FREET4, T3FREE, THYROIDAB in the last 72 hours. Anemia Panel: No results for input(s): VITAMINB12, FOLATE, FERRITIN, TIBC, IRON, RETICCTPCT in the last 72 hours. Urine analysis:    Component Value Date/Time   COLORURINE YELLOW 07/13/2017 2335   APPEARANCEUR CLEAR 07/13/2017 2335   LABSPEC 1.009 07/13/2017 2335   PHURINE 6.0 07/13/2017 2335   GLUCOSEU NEGATIVE 07/13/2017 2335   HGBUR NEGATIVE 07/13/2017 2335   [place] NEGATIVE 07/13/2017 2335   [place] NEGATIVE 07/13/2017 2335   PROTEINUR 100 (A) 07/13/2017 2335   UROBILINOGEN 0.2 06/11/2014 0639   NITRITE NEGATIVE 07/13/2017 2335   LEUKOCYTESUR NEGATIVE 07/13/2017 2335   Sepsis Labs: @LABRCNTIP (procalcitonin:4,lacticidven:4)  ) Recent Results (from the past 240 hour(s))  SARS Coronavirus 2 (CEPHEID- Performed in [place] hospital lab), Hosp Order     Status: None   Collection Time: 01/29/19  8:43 AM   Specimen: Nasopharyngeal Swab  Result Value Ref Range Status   SARS Coronavirus 2 NEGATIVE NEGATIVE Final    Comment: (NOTE) If result is NEGATIVE SARS-CoV-2 target nucleic acids are NOT DETECTED. The SARS-CoV-2 RNA is generally detectable in upper and lower  respiratory specimens during the acute phase of infection. The lowest  concentration of SARS-CoV-2 viral copies this assay can detect is 250  copies / mL. A negative result does not preclude SARS-CoV-2 infection  and should not be used as the sole basis for treatment or other  patient management decisions.  A negative result may occur with  improper specimen collection / handling, submission of specimen other  than nasopharyngeal swab, presence of viral mutation(s) within the  areas targeted by this assay, and inadequate number of viral copies  (<250 copies / mL). A negative result must be combined with clinical  observations, patient history, and epidemiological information. If result is POSITIVE SARS-CoV-2 target nucleic acids are DETECTED. The  SARS-CoV-2 RNA is generally detectable in upper and lower  respiratory specimens dur ing the acute phase of infection.  Positive  results are indicative of active infection with SARS-CoV-2.  Clinical  correlation with patient history and other diagnostic information is  necessary to determine patient infection status.  Positive results do  not rule out bacterial infection or co-infection with other viruses. If result is PRESUMPTIVE POSTIVE SARS-CoV-2 nucleic acids MAY BE PRESENT.   A presumptive positive result was obtained on the submitted specimen  and confirmed on repeat testing.  While 2019 novel coronavirus  (SARS-CoV-2) nucleic acids may be present in the submitted sample  additional confirmatory testing may be necessary for epidemiological  and / or clinical management purposes  to differentiate between  SARS-CoV-2 and other Sarbecovirus currently known to infect humans.  If clinically indicated additional testing with an alternate test  methodology [phone]) is advised. The SARS-CoV-2 RNA is generally  detectable in upper and lower respiratory sp ecimens during the acute  phase of infection. The expected result is Negative. Fact Sheet for Patients:  StrictlyIdeas.no Fact Sheet for Healthcare Providers: BankingDealers.co.za This test is not yet approved or cleared by the [place] FDA and has been authorized for detection and/or diagnosis of SARS-CoV-2 by  FDA under an Emergency Use Authorization (EUA).  This EUA will remain in effect (meaning this test can be used) for the duration of the COVID-19 declaration under Section 564(b)(1) of the Act, 21 U.S.C. section 360bbb-3(b)(1), unless the authorization is terminated or revoked sooner. Performed at [facility], [place] [address], [place], [place] [address]          Radiology Studies: Dg Chest [place] 1 View  Result Date: 01/30/2019 CLINICAL DATA:  Cough,sob,hx  chf,asthma EXAM: PORTABLE CHEST - 1 VIEW COMPARISON:  the previous day's study FINDINGS: Improvement in the interstitial edema since prior study. Persistent central pulmonary vascular congestion. Stable cardiomegaly. No pneumothorax. Blunting of lateral costophrenic angles suggesting small effusions. Visualized bones unremarkable. IMPRESSION: 1. Interval improvement in interstitial edema. 2. Stable cardiomegaly and small pleural effusions. Electronically Signed   By: Lucrezia Europe M.D.   On: 01/30/2019 12:45   Dg Chest Port 1 View  Result Date: 01/29/2019 CLINICAL DATA:  Onset shortness of breath and cough last night. EXAM: PORTABLE CHEST 1 VIEW COMPARISON:  PA and lateral chest 07/14/2017. Single-view of the chest 02/10/2017. FINDINGS: There is cardiomegaly and pulmonary edema. No pneumothorax or pleural fluid. Aortic atherosclerosis noted. No acute or focal bony abnormality. IMPRESSION: Cardiomegaly and pulmonary edema. Electronically Signed   By: Inge Rise M.D.   On: 01/29/2019 08:56        Scheduled Meds: . aspirin EC  162 mg Oral Daily  . atorvastatin  40 mg Oral q1800  . timolol  1 drop Both Eyes BID   And  . brimonidine  1 drop Both Eyes BID  . [START ON 01/31/2019] calcitRIOL  0.5 mcg Oral Q T,Th,Sa-HD  . calcium acetate  2,001 mg Oral TID WC  . carvedilol  25 mg Oral BID WC  . Chlorhexidine Gluconate Cloth  6 each Topical Q0600  . dextrose  50 mL Intravenous Once  . heparin  5,000 Units Subcutaneous Q8H  . hydrALAZINE  50 mg Oral Q8H  . insulin aspart  0-9 Units Subcutaneous TID WC  . insulin detemir  15 Units Subcutaneous Daily  . latanoprost  1 drop Both Eyes QPM  . multivitamin  1 tablet Oral QHS  . pantoprazole  40 mg Oral BID AC  . patiromer  8.4 g Oral Daily   Continuous Infusions:   LOS: 0 days    Time spent: 101min    Domenic Polite, MD [facility]  01/30/2019, 2:03 PM

## 2019-01-30 NOTE — ED Notes (Signed)
Pt CBG 61.. attempted to give D50 but IV blew. Pt given juice x2 and cheese/crackers. Recent CBG 119

## 2019-01-31 ENCOUNTER — Encounter (HOSPITAL_COMMUNITY): Payer: Self-pay

## 2019-01-31 ENCOUNTER — Other Ambulatory Visit: Payer: Self-pay

## 2019-01-31 LAB — BASIC METABOLIC PANEL
Anion gap: 11 (ref 5–15)
BUN: 49 mg/dL — ABNORMAL HIGH (ref 8–23)
CO2: 25 mmol/L (ref 22–32)
Calcium: 8.8 mg/dL — ABNORMAL LOW (ref 8.9–10.3)
Chloride: 95 mmol/L — ABNORMAL LOW (ref 98–111)
Creatinine, Ser: 6.69 mg/dL — ABNORMAL HIGH (ref 0.44–1.00)
GFR calc Af Amer: 6 mL/min — ABNORMAL LOW (ref >60)
GFR calc non Af Amer: 6 mL/min — ABNORMAL LOW (ref >60)
Glucose, Bld: 100 mg/dL — ABNORMAL HIGH (ref 70–99)
Potassium: 5.7 mmol/L — ABNORMAL HIGH (ref 3.5–5.1)
Sodium: 131 mmol/L — ABNORMAL LOW (ref 135–145)

## 2019-01-31 LAB — GLUCOSE, CAPILLARY
Glucose-Capillary: 98 mg/dL (ref 70–99)
Glucose-Capillary: 222 mg/dL — ABNORMAL HIGH (ref 70–99)
Glucose-Capillary: 55 mg/dL — ABNORMAL LOW (ref 70–99)
Glucose-Capillary: 131 mg/dL — ABNORMAL HIGH (ref 70–99)
Glucose-Capillary: 222 mg/dL — ABNORMAL HIGH (ref 70–99)

## 2019-01-31 MED ORDER — HEPARIN SODIUM (PORCINE) 1000 UNIT/ML DIALYSIS
1000.0000 [IU] | INTRAMUSCULAR | Status: DC | PRN
  Filled 2019-01-31: qty 1

## 2019-01-31 MED ORDER — SODIUM CHLORIDE 0.9 % IV SOLN: 100.0000 mL | INTRAVENOUS | Status: DC | PRN

## 2019-01-31 MED ORDER — LIDOCAINE-PRILOCAINE 2.5-2.5 % EX CREA: 1.0000 "application " | TOPICAL_CREAM | CUTANEOUS | Status: DC | PRN

## 2019-01-31 MED ORDER — ALTEPLASE 2 MG IJ SOLR
2.0000 mg | Freq: Once | INTRAMUSCULAR | Status: DC | PRN
  Filled 2019-01-31: qty 2

## 2019-01-31 MED ORDER — LIDOCAINE HCL (PF) 1 % IJ SOLN
5.0000 mL | INTRAMUSCULAR | Status: DC | PRN
  Filled 2019-01-31: qty 5

## 2019-01-31 MED ORDER — PENTAFLUOROPROP-TETRAFLUOROETH EX AERO: 1.0000 "application " | INHALATION_SPRAY | CUTANEOUS | Status: DC | PRN

## 2019-01-31 NOTE — Progress Notes (Signed)
SATURATION QUALIFICATIONS: (This note is used to comply with regulatory documentation for home oxygen)  Patient Saturations on Room Air at Rest = 95%  Patient Saturations on Room Air while Ambulating = 94%  Patient Saturations on 2 Liters of oxygen while Ambulating = 97%  Please briefly explain why patient needs home oxygen:

## 2019-01-31 NOTE — Progress Notes (Signed)
This afternoon patient's CBG was 55 - patient asymptomatic. She received  240 ml cranberry juice and after she ate dinner, CBG came up to 131. Will continue to monitor.

## 2019-01-31 NOTE — Progress Notes (Addendum)
   [facility] Progress Note   Subjective:  Seen in room. Desat yesterday so back on nasal O2. No new complaints, this am. Denies SOB, CP. For HD today.   Objective Vitals:   01/30/19 1240 01/30/19 1248 01/30/19 2123 01/31/19 0515  BP:   (!) 154/63 (!) 145/52  Pulse:   72 68  Resp:   18 18  Temp:   98.7 F (37.1 C) 99.1 F (37.3 C)  TempSrc:    Oral  SpO2: (!) 88% 91% 96% 100%    Physical Exam General: WNWD female NAD  Heart: RRR Lungs: CTAB, Faint crackles at bases   Abdomen: soft NTND Extremities: No  LE edema  Dialysis Access: L AVF +bruit   Weight change:    Additional Objective Labs: Basic Metabolic Panel: Recent Labs  Lab 01/29/19 0840 01/30/19 0653 01/31/19 0800  NA 137 136 131*  K 6.0* 5.4* 5.7*  CL 101 99 95*  CO2 25 26 25   GLUCOSE 107* 187* 100*  BUN 52* 38* 49*  CREATININE 6.49* 5.13* 6.69*  CALCIUM 8.9 8.6* 8.8*   CBC: Recent Labs  Lab 01/29/19 0840 01/30/19 0653  WBC 9.0 5.3  NEUTROABS 6.5  --   HGB 11.5* 11.9*  HCT 38.3 39.9  MCV 101.9* 103.1*  PLT 236 181   Blood Culture    Component Value Date/Time   SDES URINE, RANDOM 02/08/2017 1351   SPECREQUEST NONE 02/08/2017 1351   CULT >=100,000 COLONIES/mL ESCHERICHIA COLI (A) 02/08/2017 1351   REPTSTATUS 02/11/2017 FINAL 02/08/2017 1351     Medications:  . aspirin EC  162 mg Oral Daily  . atorvastatin  40 mg Oral q1800  . timolol  1 drop Both Eyes BID   And  . brimonidine  1 drop Both Eyes BID  . calcitRIOL  0.5 mcg Oral Q T,Th,Sa-HD  . calcium acetate  2,001 mg Oral TID WC  . carvedilol  25 mg Oral BID WC  . Chlorhexidine Gluconate Cloth  6 each Topical Q0600  . dextrose  50 mL Intravenous Once  . heparin  5,000 Units Subcutaneous Q8H  . hydrALAZINE  50 mg Oral Q8H  . insulin aspart  0-9 Units Subcutaneous TID WC  . insulin detemir  15 Units Subcutaneous Daily  . latanoprost  1 drop Both Eyes QPM  . multivitamin  1 tablet Oral QHS  . pantoprazole  40 mg Oral  BID AC  . patiromer  8.4 g Oral Daily    Dialysis Orders:  TTS [place]  4h   86kg   2/2.25 bath  LUA AVF  Hep none  Assessment/Plan: 1. Acute pulm edema/volume overload. Improving after HD 6/23.  HD today for volume removal. Get standing weights and lower EDW as appropriate.  2. ESRD - HD TTS For HD today  3. HTN - BP stable. On hydralazine tid.  4. Anemia- Hgb stable. No ESA needs currently  5. Metabolic Bone Disease- Continue usual meds  6. dCHF  7. DM Type 2    Kathleen Larina Earthly PA-C [facility] Pager [phone] 01/31/2019,9:06 AM  LOS: 1 day   Pt seen, examined and agree w A/P as above.  Kathleen Splinter  MD 01/31/2019, 4:07 PM

## 2019-01-31 NOTE — Progress Notes (Signed)
Patient back from dialysis; alert and oriented x 4; no acute distress noted, no complaints; VS stable. Will continue to monitor.

## 2019-01-31 NOTE — Progress Notes (Signed)
Patient went to dialysis.

## 2019-01-31 NOTE — Progress Notes (Signed)
PROGRESS NOTE    Kathleen [name]  KWI:[id number] DOB: 10-Mar-1946 DOA: 01/29/2019 PCP: Benito Mccreedy, MD  Brief Narrative: Obese 73 year old female with history of ESRD on hemodialysis TTS, type 2 diabetes mellitus, hypertension presented to the emergency room overnight with acute onset of dyspnea, chest x-ray noted pulmonary edema. -Patient denied any fever productive cough chest pain nausea or vomiting -Nephrology consulted, underwent urgent HD early yesterday a.m. -Clinically improving  Assessment & Plan:   Acute hypoxic respiratory failure/pulmonary edema -Improving with volume removal on HD, 2 L UF yesterday -Plan for HD today for further lowering of dry weight -Repeat chest x-ray with small pleural effusions, improving pulmonary edema -Wean off O2 after HD today -Ambulate, physical therapy evaluation -Discharge planning  Hyperkalemia -Potassium was 6.0 on admission, subsequently 5.4 yesterday a.m. now 5.7 -HD today, continue Veltassa, renal diet  ESRD on hemodialysis Tuesday Thursday Saturday -With pulmonary edema as above  Type 2 diabetes mellitus -Continue Levemir at lower dose 15 units daily, last A1c was 9.1 in 08/2018 -CBG stable  Anemia of chronic disease -Stable  Essential hypertension -Continue Coreg and hydralazine  DVT prophylaxis: Cutaneous heparin Code Status: Full code Family Communication: No family at bedside Disposition Plan: Later today after HD if stable  Consultants:   Renal   Procedures:   Antimicrobials:    Subjective: -Feels better, breathing improving, was put back on oxygen last night -Denies productive cough nausea vomiting, no fevers or chills  Objective: Vitals:   01/31/19 1200 01/31/19 1240 01/31/19 1245 01/31/19 1300  BP:  (!) 138/52 (!) 157/54 139/60  Pulse:  66 67 67  Resp:  16 18 (!) 24  Temp:  98.7 F (37.1 C)    TempSrc:  Oral    SpO2: 96% 97%    Weight:  90 kg      Intake/Output Summary (Last 24 hours) at  01/31/2019 1413 Last data filed at 01/31/2019 0900 Gross per 24 hour  Intake 480 ml  Output -  Net 480 ml   Filed Weights   01/31/19 1240  Weight: 90 kg    Examination:  Gen: Obese chronically ill female, AAO x3 HEENT: PERRLA, Neck supple, no JVD Lungs: Decreased breath sounds at both bases CVS: S1-S2/regular rate rhythm Abd: soft, Non tender, non distended, BS present Extremities: Trace edema Skin: no new rashes Psychiatry: Judgement and insight appear normal. Mood & affect appropriate.     Data Reviewed:   CBC: Recent Labs  Lab 01/29/19 0840 01/30/19 0653  WBC 9.0 5.3  NEUTROABS 6.5  --   HGB 11.5* 11.9*  HCT 38.3 39.9  MCV 101.9* 103.1*  PLT 236 242   Basic Metabolic Panel: Recent Labs  Lab 01/29/19 0840 01/30/19 0653 01/31/19 0800  NA 137 136 131*  K 6.0* 5.4* 5.7*  CL 101 99 95*  CO2 25 26 25   GLUCOSE 107* 187* 100*  BUN 52* 38* 49*  CREATININE 6.49* 5.13* 6.69*  CALCIUM 8.9 8.6* 8.8*   GFR: Estimated Creatinine Clearance: 7.8 mL/min (A) (by C-G formula based on SCr of 6.69 mg/dL (H)). Liver Function Tests: Recent Labs  Lab 01/29/19 0840  AST 27  ALT 15  ALKPHOS 71  BILITOT 0.8  PROT 6.6  ALBUMIN 3.5   No results for input(s): LIPASE, AMYLASE in the last 168 hours. No results for input(s): AMMONIA in the last 168 hours. Coagulation Profile: No results for input(s): INR, PROTIME in the last 168 hours. Cardiac Enzymes: No results for input(s): CKTOTAL, CKMB, CKMBINDEX, TROPONINI  in the last 168 hours. BNP (last 3 results) No results for input(s): PROBNP in the last 8760 hours. HbA1C: No results for input(s): HGBA1C in the last 72 hours. CBG: Recent Labs  Lab 01/30/19 0804 01/30/19 1155 01/30/19 1651 01/30/19 2141 01/31/19 0757  GLUCAP 187* 153* 133* 93 98   Lipid Profile: No results for input(s): CHOL, HDL, LDLCALC, TRIG, CHOLHDL, LDLDIRECT in the last 72 hours. Thyroid Function Tests: No results for input(s): TSH, T4TOTAL,  FREET4, T3FREE, THYROIDAB in the last 72 hours. Anemia Panel: No results for input(s): VITAMINB12, FOLATE, FERRITIN, TIBC, IRON, RETICCTPCT in the last 72 hours. Urine analysis:    Component Value Date/Time   COLORURINE YELLOW 07/13/2017 2335   APPEARANCEUR CLEAR 07/13/2017 2335   LABSPEC 1.009 07/13/2017 2335   PHURINE 6.0 07/13/2017 2335   GLUCOSEU NEGATIVE 07/13/2017 2335   HGBUR NEGATIVE 07/13/2017 2335   BILIRUBINUR NEGATIVE 07/13/2017 2335   [place] NEGATIVE 07/13/2017 2335   PROTEINUR 100 (A) 07/13/2017 2335   UROBILINOGEN 0.2 06/11/2014 0639   NITRITE NEGATIVE 07/13/2017 2335   LEUKOCYTESUR NEGATIVE 07/13/2017 2335   Sepsis Labs: @LABRCNTIP (procalcitonin:4,lacticidven:4)  ) Recent Results (from the past 240 hour(s))  SARS Coronavirus 2 (CEPHEID- Performed in [place] hospital lab), Hosp Order     Status: None   Collection Time: 01/29/19  8:43 AM   Specimen: Nasopharyngeal Swab  Result Value Ref Range Status   SARS Coronavirus 2 NEGATIVE NEGATIVE Final    Comment: (NOTE) If result is NEGATIVE SARS-CoV-2 target nucleic acids are NOT DETECTED. The SARS-CoV-2 RNA is generally detectable in upper and lower  respiratory specimens during the acute phase of infection. The lowest  concentration of SARS-CoV-2 viral copies this assay can detect is 250  copies / mL. A negative result does not preclude SARS-CoV-2 infection  and should not be used as the sole basis for treatment or other  patient management decisions.  A negative result may occur with  improper specimen collection / handling, submission of specimen other  than nasopharyngeal swab, presence of viral mutation(s) within the  areas targeted by this assay, and inadequate number of viral copies  (<250 copies / mL). A negative result must be combined with clinical  observations, patient history, and epidemiological information. If result is POSITIVE SARS-CoV-2 target nucleic acids are DETECTED. The SARS-CoV-2 RNA  is generally detectable in upper and lower  respiratory specimens dur ing the acute phase of infection.  Positive  results are indicative of active infection with SARS-CoV-2.  Clinical  correlation with patient history and other diagnostic information is  necessary to determine patient infection status.  Positive results do  not rule out bacterial infection or co-infection with other viruses. If result is PRESUMPTIVE POSTIVE SARS-CoV-2 nucleic acids MAY BE PRESENT.   A presumptive positive result was obtained on the submitted specimen  and confirmed on repeat testing.  While 2019 novel coronavirus  (SARS-CoV-2) nucleic acids may be present in the submitted sample  additional confirmatory testing may be necessary for epidemiological  and / or clinical management purposes  to differentiate between  SARS-CoV-2 and other Sarbecovirus currently known to infect humans.  If clinically indicated additional testing with an alternate test  methodology [phone]) is advised. The SARS-CoV-2 RNA is generally  detectable in upper and lower respiratory sp ecimens during the acute  phase of infection. The expected result is Negative. Fact Sheet for Patients:  StrictlyIdeas.no Fact Sheet for Healthcare Providers: BankingDealers.co.za This test is not yet approved or cleared by the [place]  States FDA and has been authorized for detection and/or diagnosis of SARS-CoV-2 by FDA under an Emergency Use Authorization (EUA).  This EUA will remain in effect (meaning this test can be used) for the duration of the COVID-19 declaration under Section 564(b)(1) of the Act, 21 U.S.C. section 360bbb-3(b)(1), unless the authorization is terminated or revoked sooner. Performed at [facility], [place] [address] [place], [place] [address]          Radiology Studies: Dg Chest [place] 1 View  Result Date: 01/30/2019 CLINICAL DATA:  Cough,sob,hx chf,asthma EXAM:  PORTABLE CHEST - 1 VIEW COMPARISON:  the previous day's study FINDINGS: Improvement in the interstitial edema since prior study. Persistent central pulmonary vascular congestion. Stable cardiomegaly. No pneumothorax. Blunting of lateral costophrenic angles suggesting small effusions. Visualized bones unremarkable. IMPRESSION: 1. Interval improvement in interstitial edema. 2. Stable cardiomegaly and small pleural effusions. Electronically Signed   By: Lucrezia Europe M.D.   On: 01/30/2019 12:45        Scheduled Meds: . aspirin EC  162 mg Oral Daily  . atorvastatin  40 mg Oral q1800  . timolol  1 drop Both Eyes BID   And  . brimonidine  1 drop Both Eyes BID  . calcitRIOL  0.5 mcg Oral Q T,Th,Sa-HD  . calcium acetate  2,001 mg Oral TID WC  . carvedilol  25 mg Oral BID WC  . Chlorhexidine Gluconate Cloth  6 each Topical Q0600  . dextrose  50 mL Intravenous Once  . heparin  5,000 Units Subcutaneous Q8H  . hydrALAZINE  50 mg Oral Q8H  . insulin aspart  0-9 Units Subcutaneous TID WC  . insulin detemir  15 Units Subcutaneous Daily  . latanoprost  1 drop Both Eyes QPM  . multivitamin  1 tablet Oral QHS  . pantoprazole  40 mg Oral BID AC  . patiromer  8.4 g Oral Daily   Continuous Infusions: . [START ON 02/01/2019] sodium chloride    . [START ON 02/01/2019] sodium chloride       LOS: 1 day    Time spent: 20min    Domenic Polite, MD [facility]  01/31/2019, 2:13 PM

## 2019-01-31 NOTE — Progress Notes (Signed)
Writer called patient's son, Lindie Roberson and updated him on his mom.

## 2019-02-01 DIAGNOSIS — E1121 Type 2 diabetes mellitus with diabetic nephropathy: Secondary | ICD-10-CM

## 2019-02-01 DIAGNOSIS — D631 Anemia in chronic kidney disease: Secondary | ICD-10-CM

## 2019-02-01 DIAGNOSIS — N189 Chronic kidney disease, unspecified: Secondary | ICD-10-CM

## 2019-02-01 LAB — GLUCOSE, CAPILLARY
Glucose-Capillary: 109 mg/dL — ABNORMAL HIGH (ref 70–99)
Glucose-Capillary: 208 mg/dL — ABNORMAL HIGH (ref 70–99)

## 2019-02-01 NOTE — Progress Notes (Signed)
Discharge instructions given. Pt verbalized understanding and all questions were answered.  

## 2019-02-01 NOTE — Progress Notes (Signed)
Renal Navigator notified [facility] HD clinic[facility] of patient's plan for discharge today to provide continuity of care.  Alphonzo Cruise, [place] Renal Navigator [phone]

## 2019-02-01 NOTE — Discharge Summary (Signed)
Physician Discharge Summary  Kathleen [name] BFX:[id number] DOB: 14-Oct-1945 DOA: 01/29/2019  PCP: Benito Mccreedy, MD  Admit date: 01/29/2019 Discharge date: 02/01/2019  Time spent: 45 minutes  Recommendations for Outpatient Follow-up:  1. PCP in 1 week 2. Continue to lower dry weight on outpatient dialysis as tolerated   Discharge Diagnoses:  Principal Problem:   Pulmonary edema Active Problems:   Hypertensive urgency   Hyperlipidemia   ESRD on dialysis (HCC)   CAD (coronary artery disease)   Anemia   Essential hypertension   Anemia in chronic kidney disease   DM (diabetes mellitus), type 2 with renal complications (HCC)   Hyperkalemia   Acute on chronic diastolic CHF (congestive heart failure) ([place])   Acute respiratory failure with hypoxia ([place])   Discharge Condition: Stable  Diet recommendation: Renal diabetic  Filed Weights   01/31/19 1240 01/31/19 1545 01/31/19 1621  Weight: 90 kg 88.4 kg 88.4 kg    History of present illness:   Obese 73 year old female with history of ESRD on hemodialysis TTS, type 2 diabetes mellitus, hypertension presented to the emergency room overnight with acute onset of dyspnea, chest x-ray noted pulmonary edema.  Hospital Course:  Acute hypoxic respiratory failure/pulmonary edema -Improving with volume removal on HD, 2 L UF day before yesterday -Underwent dialysis again yesterday with attempt for fluid removal however was only able to take off 300 cc -Clinically improved, weaned off oxygen -Stable for discharge home today, next dialysis will be tomorrow, will need her dry weight challenged further as tolerated  Hyperkalemia -Resolved with HD  ESRD on hemodialysis Tuesday Thursday Saturday -With pulmonary edema as above  Type 2 diabetes mellitus -Continue Levemir at lower dose 15 units daily, last A1c was 9.1 in 08/2018 -CBG stable  Anemia of chronic disease -Stable  Essential hypertension -Continue Coreg and  hydralazine   Discharge Exam: Vitals:   01/31/19 2057 02/01/19 0605  BP: 122/77 102/71  Pulse: 80 80  Resp: 18 20  Temp: 98.6 F (37 C) 97.9 F (36.6 C)  SpO2: 100% 90%    General: AAOx3 Cardiovascular: S1S2/RRR Respiratory: CTAB  Discharge Instructions   Discharge Instructions    Discharge instructions   Complete by: As directed    Renal Diabetic diet   Increase activity slowly   Complete by: As directed      Allergies as of 02/01/2019      Reactions   Baclofen    Severe delirium when given to pateint in Dec 2018.       Medication List    STOP taking these medications   calcitRIOL 0.5 MCG capsule Commonly known as: ROCALTROL   multivitamin Tabs tablet     TAKE these medications   acetaminophen 325 MG tablet Commonly known as: TYLENOL Take 2 tablets (650 mg total) by mouth every 4 (four) hours as needed for mild pain (or temp > 37.5 C (99.5 F)).   aspirin 81 MG EC tablet Take 4 tablets (325 mg total) by mouth daily. What changed: how much to take   atorvastatin 40 MG tablet Commonly known as: LIPITOR Take 1 tablet (40 mg total) by mouth daily at 6 PM.   brimonidine-timolol 0.2-0.5 % ophthalmic solution Commonly known as: COMBIGAN Place 1 drop into both eyes every 12 (twelve) hours.   calcium acetate 667 MG capsule Commonly known as: PHOSLO Take 1 capsule (667 mg total) by mouth as needed (with snacks). What changed:   how much to take  when to take this  additional instructions  carvedilol 25 MG tablet Commonly known as: COREG Take 25 mg by mouth 2 (two) times daily.   hydrALAZINE 25 MG tablet Commonly known as: APRESOLINE Take 1 tablet (25 mg total) by mouth every 8 (eight) hours as needed (SBP > 170  or DBP > 110). What changed:   how much to take  when to take this   insulin detemir 100 UNIT/ML injection Commonly known as: LEVEMIR Inject 0.15 mLs (15 Units total) into the skin at bedtime. What changed: how much to take    latanoprost 0.005 % ophthalmic solution Commonly known as: XALATAN Place 1 drop into both eyes every evening.   pantoprazole 40 MG tablet Commonly known as: PROTONIX Take 1 tablet (40 mg total) by mouth 2 (two) times daily before a meal. Take 1 tablet twice daily for a  Month then once dailly   polyethylene glycol 17 g packet Commonly known as: MIRALAX / GLYCOLAX Take 17 g by mouth daily. What changed:   when to take this  reasons to take this   RenaPlex Tabs Take 1 tablet by mouth daily.   timolol 0.5 % ophthalmic solution Commonly known as: TIMOPTIC Place 1 drop into both eyes daily.      Allergies  Allergen Reactions  . Baclofen     Severe delirium when given to pateint in Dec 2018.    Follow-up Information    Osei-Bonsu, Iona Beard, MD. Schedule an appointment as soon as possible for a visit in 1 week(s).   Specialty: Internal Medicine Contact information: [address] [place] [address] [phone]            The results of significant diagnostics from this hospitalization (including imaging, microbiology, ancillary and laboratory) are listed below for reference.    Significant Diagnostic Studies: Dg Chest Port 1 View  Result Date: 01/30/2019 CLINICAL DATA:  Cough,sob,hx chf,asthma EXAM: PORTABLE CHEST - 1 VIEW COMPARISON:  the previous day's study FINDINGS: Improvement in the interstitial edema since prior study. Persistent central pulmonary vascular congestion. Stable cardiomegaly. No pneumothorax. Blunting of lateral costophrenic angles suggesting small effusions. Visualized bones unremarkable. IMPRESSION: 1. Interval improvement in interstitial edema. 2. Stable cardiomegaly and small pleural effusions. Electronically Signed   By: Lucrezia Europe M.D.   On: 01/30/2019 12:45   Dg Chest Port 1 View  Result Date: 01/29/2019 CLINICAL DATA:  Onset shortness of breath and cough last night. EXAM: PORTABLE CHEST 1 VIEW COMPARISON:  PA and lateral  chest 07/14/2017. Single-view of the chest 02/10/2017. FINDINGS: There is cardiomegaly and pulmonary edema. No pneumothorax or pleural fluid. Aortic atherosclerosis noted. No acute or focal bony abnormality. IMPRESSION: Cardiomegaly and pulmonary edema. Electronically Signed   By: Inge Rise M.D.   On: 01/29/2019 08:56    Microbiology: Recent Results (from the past 240 hour(s))  SARS Coronavirus 2 (CEPHEID- Performed in [place] hospital lab), Hosp Order     Status: None   Collection Time: 01/29/19  8:43 AM   Specimen: Nasopharyngeal Swab  Result Value Ref Range Status   SARS Coronavirus 2 NEGATIVE NEGATIVE Final    Comment: (NOTE) If result is NEGATIVE SARS-CoV-2 target nucleic acids are NOT DETECTED. The SARS-CoV-2 RNA is generally detectable in upper and lower  respiratory specimens during the acute phase of infection. The lowest  concentration of SARS-CoV-2 viral copies this assay can detect is 250  copies / mL. A negative result does not preclude SARS-CoV-2 infection  and should not be used as the sole basis  for treatment or other  patient management decisions.  A negative result may occur with  improper specimen collection / handling, submission of specimen other  than nasopharyngeal swab, presence of viral mutation(s) within the  areas targeted by this assay, and inadequate number of viral copies  (<250 copies / mL). A negative result must be combined with clinical  observations, patient history, and epidemiological information. If result is POSITIVE SARS-CoV-2 target nucleic acids are DETECTED. The SARS-CoV-2 RNA is generally detectable in upper and lower  respiratory specimens dur ing the acute phase of infection.  Positive  results are indicative of active infection with SARS-CoV-2.  Clinical  correlation with patient history and other diagnostic information is  necessary to determine patient infection status.  Positive results do  not rule out bacterial infection  or co-infection with other viruses. If result is PRESUMPTIVE POSTIVE SARS-CoV-2 nucleic acids MAY BE PRESENT.   A presumptive positive result was obtained on the submitted specimen  and confirmed on repeat testing.  While 2019 novel coronavirus  (SARS-CoV-2) nucleic acids may be present in the submitted sample  additional confirmatory testing may be necessary for epidemiological  and / or clinical management purposes  to differentiate between  SARS-CoV-2 and other Sarbecovirus currently known to infect humans.  If clinically indicated additional testing with an alternate test  methodology [phone]) is advised. The SARS-CoV-2 RNA is generally  detectable in upper and lower respiratory sp ecimens during the acute  phase of infection. The expected result is Negative. Fact Sheet for Patients:  StrictlyIdeas.no Fact Sheet for Healthcare Providers: BankingDealers.co.za This test is not yet approved or cleared by the [place] FDA and has been authorized for detection and/or diagnosis of SARS-CoV-2 by FDA under an Emergency Use Authorization (EUA).  This EUA will remain in effect (meaning this test can be used) for the duration of the COVID-19 declaration under Section 564(b)(1) of the Act, 21 U.S.C. section 360bbb-3(b)(1), unless the authorization is terminated or revoked sooner. Performed at [facility], [place] [address], [place], [place] [address]      Labs: Basic Metabolic Panel: Recent Labs  Lab 01/29/19 0840 01/30/19 0653 01/31/19 0800 02/01/19 0815  NA 137 136 131* 134*  K 6.0* 5.4* 5.7* 4.5  CL 101 99 95* 96*  CO2 25 26 25 28   GLUCOSE 107* 187* 100* 153*  BUN 52* 38* 49* 25*  CREATININE 6.49* 5.13* 6.69* 4.43*  CALCIUM 8.9 8.6* 8.8* 9.2   Liver Function Tests: Recent Labs  Lab 01/29/19 0840  AST 27  ALT 15  ALKPHOS 71  BILITOT 0.8  PROT 6.6  ALBUMIN 3.5   No results for input(s): LIPASE, AMYLASE in the  last 168 hours. No results for input(s): AMMONIA in the last 168 hours. CBC: Recent Labs  Lab 01/29/19 0840 01/30/19 0653  WBC 9.0 5.3  NEUTROABS 6.5  --   HGB 11.5* 11.9*  HCT 38.3 39.9  MCV 101.9* 103.1*  PLT 236 181   Cardiac Enzymes: No results for input(s): CKTOTAL, CKMB, CKMBINDEX, TROPONINI in the last 168 hours. BNP: BNP (last 3 results) Recent Labs    01/29/19 0840  BNP 1,852.6*    ProBNP (last 3 results) No results for input(s): PROBNP in the last 8760 hours.  CBG: Recent Labs  Lab 01/31/19 1659 01/31/19 1755 01/31/19 2105 02/01/19 0737 02/01/19 1211  GLUCAP 55* 131* 222* 109* 208*       Signed:  Domenic Polite MD.  [facility] 02/01/2019, 12:28 PM

## 2019-02-01 NOTE — Progress Notes (Addendum)
[facility] Progress Note   Subjective:  Seen in room. Up walking without O2 - going home. Feels good.Denies problems with Hd yesterday  Objective Vitals:   01/31/19 1621 01/31/19 1756 01/31/19 2057 02/01/19 0605  BP: (!) 151/64 (!) 149/68 122/77 102/71  Pulse: 74 87 80 80  Resp: 18 18 18 20   Temp: 98.6 F (37 C)  98.6 F (37 C) 97.9 F (36.6 C)  TempSrc: Oral  Oral Oral  SpO2: 96% 95% 100% 90%  Weight: 88.4 kg     Height:   5\' 3"  (1.6 m)     Physical Exam General: WNWD female NAD  Heart: RRR Lungs: CTAB, Abdomen: soft NTND Extremities: No  LE edema  Dialysis Access: L AVF +bruit   Weight change:    Additional Objective Labs: Basic Metabolic Panel: Recent Labs  Lab 01/30/19 0653 01/31/19 0800 02/01/19 0815  NA 136 131* 134*  K 5.4* 5.7* 4.5  CL 99 95* 96*  CO2 26 25 28   GLUCOSE 187* 100* 153*  BUN 38* 49* 25*  CREATININE 5.13* 6.69* 4.43*  CALCIUM 8.6* 8.8* 9.2   CBC: Recent Labs  Lab 01/29/19 0840 01/30/19 0653  WBC 9.0 5.3  NEUTROABS 6.5  --   HGB 11.5* 11.9*  HCT 38.3 39.9  MCV 101.9* 103.1*  PLT 236 181  Medications: . sodium chloride    . sodium chloride     . aspirin EC  162 mg Oral Daily  . atorvastatin  40 mg Oral q1800  . timolol  1 drop Both Eyes BID   And  . brimonidine  1 drop Both Eyes BID  . calcitRIOL  0.5 mcg Oral Q T,Th,Sa-HD  . calcium acetate  2,001 mg Oral TID WC  . carvedilol  25 mg Oral BID WC  . Chlorhexidine Gluconate Cloth  6 each Topical Q0600  . dextrose  50 mL Intravenous Once  . heparin  5,000 Units Subcutaneous Q8H  . hydrALAZINE  50 mg Oral Q8H  . insulin aspart  0-9 Units Subcutaneous TID WC  . insulin detemir  15 Units Subcutaneous Daily  . latanoprost  1 drop Both Eyes QPM  . multivitamin  1 tablet Oral QHS  . pantoprazole  40 mg Oral BID AC  . patiromer  8.4 g Oral Daily    Dialysis Orders:  TTS [place]  4h   86kg   2/2.25 bath  LUA AVF  Hep none  Assessment/Plan: 1. Acute pulm  edema/volume overload. Improving after HD 6/23.  HD today for volume removal. Get standing weights and lower EDW as appropriate.  Net UF 2.1 Tuesday and only 300 ml Thursday with post wt  = 88.4 which is ABOVE her EDW - probably not standing wt 2. ESRD - HD TTS next HD Saturday at home unit 3. HTN - BP stable. On hydralazine tid. - had one BP drop mid treatment - now sure how real as all others were fine - weight does not appear to be standing- she has consistently been getting 0.5 kg below her edw prior to admission and it hadn't been lowered - will lower at discharge and have dialysis reassess Saturday if goal is not within usual range.  4. Anemia- Hgb stable. No ESA needs currently - had been on ESA prior to admission will d/c  5. Metabolic Bone Disease- Continue usual meds - on Hectorol 4 prior to admission - continue 6. dCHF  7. DM Type 2 8. Disp - d/c today  Shearon Stalls PA-C [facility] 02/01/2019,10:31 AM  LOS: 2 days   Pt seen, examined and agree w A/P as above. We didn't get much fluid off while she was here but she has improved significantly regardless. Will try to lower edw as OP as tolerated.  [name]  MD 02/01/2019, 11:26 AM

## 2019-02-02 DIAGNOSIS — N186 End stage renal disease: Secondary | ICD-10-CM | POA: Diagnosis not present

## 2019-02-02 DIAGNOSIS — D509 Iron deficiency anemia, unspecified: Secondary | ICD-10-CM | POA: Diagnosis not present

## 2019-02-02 DIAGNOSIS — E1129 Type 2 diabetes mellitus with other diabetic kidney complication: Secondary | ICD-10-CM | POA: Diagnosis not present

## 2019-02-02 DIAGNOSIS — Z992 Dependence on renal dialysis: Secondary | ICD-10-CM | POA: Diagnosis not present

## 2019-02-02 DIAGNOSIS — N2581 Secondary hyperparathyroidism of renal origin: Secondary | ICD-10-CM | POA: Diagnosis not present

## 2019-02-02 DIAGNOSIS — D631 Anemia in chronic kidney disease: Secondary | ICD-10-CM | POA: Diagnosis not present

## 2019-02-02 LAB — BASIC METABOLIC PANEL
Anion gap: 10 (ref 5–15)
BUN: 25 mg/dL — ABNORMAL HIGH (ref 8–23)
CO2: 28 mmol/L (ref 22–32)
Calcium: 9.2 mg/dL (ref 8.9–10.3)
Chloride: 96 mmol/L — ABNORMAL LOW (ref 98–111)
Creatinine, Ser: 4.43 mg/dL — ABNORMAL HIGH (ref 0.44–1.00)
GFR calc Af Amer: 11 mL/min — ABNORMAL LOW (ref >60)
GFR calc non Af Amer: 9 mL/min — ABNORMAL LOW (ref >60)
Glucose, Bld: 153 mg/dL — ABNORMAL HIGH (ref 70–99)
Potassium: 4.5 mmol/L (ref 3.5–5.1)
Sodium: 134 mmol/L — ABNORMAL LOW (ref 135–145)

## 2019-02-05 DIAGNOSIS — Z992 Dependence on renal dialysis: Secondary | ICD-10-CM | POA: Diagnosis not present

## 2019-02-05 DIAGNOSIS — D631 Anemia in chronic kidney disease: Secondary | ICD-10-CM | POA: Diagnosis not present

## 2019-02-05 DIAGNOSIS — N186 End stage renal disease: Secondary | ICD-10-CM | POA: Diagnosis not present

## 2019-02-05 DIAGNOSIS — N2581 Secondary hyperparathyroidism of renal origin: Secondary | ICD-10-CM | POA: Diagnosis not present

## 2019-02-05 DIAGNOSIS — E1129 Type 2 diabetes mellitus with other diabetic kidney complication: Secondary | ICD-10-CM | POA: Diagnosis not present

## 2019-02-05 DIAGNOSIS — D509 Iron deficiency anemia, unspecified: Secondary | ICD-10-CM | POA: Diagnosis not present

## 2019-02-06 DIAGNOSIS — N186 End stage renal disease: Secondary | ICD-10-CM | POA: Diagnosis not present

## 2019-02-06 DIAGNOSIS — E1122 Type 2 diabetes mellitus with diabetic chronic kidney disease: Secondary | ICD-10-CM | POA: Diagnosis not present

## 2019-02-06 DIAGNOSIS — Z992 Dependence on renal dialysis: Secondary | ICD-10-CM | POA: Diagnosis not present

## 2019-02-07 DIAGNOSIS — E1129 Type 2 diabetes mellitus with other diabetic kidney complication: Secondary | ICD-10-CM | POA: Diagnosis not present

## 2019-02-07 DIAGNOSIS — Z992 Dependence on renal dialysis: Secondary | ICD-10-CM | POA: Diagnosis not present

## 2019-02-07 DIAGNOSIS — D631 Anemia in chronic kidney disease: Secondary | ICD-10-CM | POA: Diagnosis not present

## 2019-02-07 DIAGNOSIS — N2581 Secondary hyperparathyroidism of renal origin: Secondary | ICD-10-CM | POA: Diagnosis not present

## 2019-02-07 DIAGNOSIS — N186 End stage renal disease: Secondary | ICD-10-CM | POA: Diagnosis not present

## 2019-02-07 DIAGNOSIS — D509 Iron deficiency anemia, unspecified: Secondary | ICD-10-CM | POA: Diagnosis not present

## 2019-02-09 DIAGNOSIS — N186 End stage renal disease: Secondary | ICD-10-CM | POA: Diagnosis not present

## 2019-02-09 DIAGNOSIS — D509 Iron deficiency anemia, unspecified: Secondary | ICD-10-CM | POA: Diagnosis not present

## 2019-02-09 DIAGNOSIS — E1129 Type 2 diabetes mellitus with other diabetic kidney complication: Secondary | ICD-10-CM | POA: Diagnosis not present

## 2019-02-09 DIAGNOSIS — N2581 Secondary hyperparathyroidism of renal origin: Secondary | ICD-10-CM | POA: Diagnosis not present

## 2019-02-09 DIAGNOSIS — Z992 Dependence on renal dialysis: Secondary | ICD-10-CM | POA: Diagnosis not present

## 2019-02-09 DIAGNOSIS — D631 Anemia in chronic kidney disease: Secondary | ICD-10-CM | POA: Diagnosis not present

## 2019-02-12 DIAGNOSIS — D631 Anemia in chronic kidney disease: Secondary | ICD-10-CM | POA: Diagnosis not present

## 2019-02-12 DIAGNOSIS — Z992 Dependence on renal dialysis: Secondary | ICD-10-CM | POA: Diagnosis not present

## 2019-02-12 DIAGNOSIS — D509 Iron deficiency anemia, unspecified: Secondary | ICD-10-CM | POA: Diagnosis not present

## 2019-02-12 DIAGNOSIS — N186 End stage renal disease: Secondary | ICD-10-CM | POA: Diagnosis not present

## 2019-02-12 DIAGNOSIS — E1129 Type 2 diabetes mellitus with other diabetic kidney complication: Secondary | ICD-10-CM | POA: Diagnosis not present

## 2019-02-12 DIAGNOSIS — N2581 Secondary hyperparathyroidism of renal origin: Secondary | ICD-10-CM | POA: Diagnosis not present

## 2019-02-13 DIAGNOSIS — E119 Type 2 diabetes mellitus without complications: Secondary | ICD-10-CM | POA: Diagnosis not present

## 2019-02-13 DIAGNOSIS — Z992 Dependence on renal dialysis: Secondary | ICD-10-CM | POA: Diagnosis not present

## 2019-02-13 DIAGNOSIS — R51 Headache: Secondary | ICD-10-CM | POA: Diagnosis not present

## 2019-02-13 DIAGNOSIS — Z720 Tobacco use: Secondary | ICD-10-CM | POA: Diagnosis not present

## 2019-02-13 DIAGNOSIS — I10 Essential (primary) hypertension: Secondary | ICD-10-CM | POA: Diagnosis not present

## 2019-02-13 DIAGNOSIS — N186 End stage renal disease: Secondary | ICD-10-CM | POA: Diagnosis not present

## 2019-02-13 DIAGNOSIS — E785 Hyperlipidemia, unspecified: Secondary | ICD-10-CM | POA: Diagnosis not present

## 2019-02-14 DIAGNOSIS — E1129 Type 2 diabetes mellitus with other diabetic kidney complication: Secondary | ICD-10-CM | POA: Diagnosis not present

## 2019-02-14 DIAGNOSIS — D631 Anemia in chronic kidney disease: Secondary | ICD-10-CM | POA: Diagnosis not present

## 2019-02-14 DIAGNOSIS — D509 Iron deficiency anemia, unspecified: Secondary | ICD-10-CM | POA: Diagnosis not present

## 2019-02-14 DIAGNOSIS — N2581 Secondary hyperparathyroidism of renal origin: Secondary | ICD-10-CM | POA: Diagnosis not present

## 2019-02-14 DIAGNOSIS — Z992 Dependence on renal dialysis: Secondary | ICD-10-CM | POA: Diagnosis not present

## 2019-02-14 DIAGNOSIS — N186 End stage renal disease: Secondary | ICD-10-CM | POA: Diagnosis not present

## 2019-02-16 DIAGNOSIS — Z992 Dependence on renal dialysis: Secondary | ICD-10-CM | POA: Diagnosis not present

## 2019-02-16 DIAGNOSIS — N2581 Secondary hyperparathyroidism of renal origin: Secondary | ICD-10-CM | POA: Diagnosis not present

## 2019-02-16 DIAGNOSIS — D509 Iron deficiency anemia, unspecified: Secondary | ICD-10-CM | POA: Diagnosis not present

## 2019-02-16 DIAGNOSIS — N186 End stage renal disease: Secondary | ICD-10-CM | POA: Diagnosis not present

## 2019-02-16 DIAGNOSIS — E1129 Type 2 diabetes mellitus with other diabetic kidney complication: Secondary | ICD-10-CM | POA: Diagnosis not present

## 2019-02-16 DIAGNOSIS — D631 Anemia in chronic kidney disease: Secondary | ICD-10-CM | POA: Diagnosis not present

## 2019-02-19 DIAGNOSIS — E1129 Type 2 diabetes mellitus with other diabetic kidney complication: Secondary | ICD-10-CM | POA: Diagnosis not present

## 2019-02-19 DIAGNOSIS — N2581 Secondary hyperparathyroidism of renal origin: Secondary | ICD-10-CM | POA: Diagnosis not present

## 2019-02-19 DIAGNOSIS — D631 Anemia in chronic kidney disease: Secondary | ICD-10-CM | POA: Diagnosis not present

## 2019-02-19 DIAGNOSIS — Z992 Dependence on renal dialysis: Secondary | ICD-10-CM | POA: Diagnosis not present

## 2019-02-19 DIAGNOSIS — D509 Iron deficiency anemia, unspecified: Secondary | ICD-10-CM | POA: Diagnosis not present

## 2019-02-19 DIAGNOSIS — N186 End stage renal disease: Secondary | ICD-10-CM | POA: Diagnosis not present

## 2019-02-21 DIAGNOSIS — Z992 Dependence on renal dialysis: Secondary | ICD-10-CM | POA: Diagnosis not present

## 2019-02-21 DIAGNOSIS — N186 End stage renal disease: Secondary | ICD-10-CM | POA: Diagnosis not present

## 2019-02-21 DIAGNOSIS — E1129 Type 2 diabetes mellitus with other diabetic kidney complication: Secondary | ICD-10-CM | POA: Diagnosis not present

## 2019-02-21 DIAGNOSIS — N2581 Secondary hyperparathyroidism of renal origin: Secondary | ICD-10-CM | POA: Diagnosis not present

## 2019-02-21 DIAGNOSIS — D509 Iron deficiency anemia, unspecified: Secondary | ICD-10-CM | POA: Diagnosis not present

## 2019-02-21 DIAGNOSIS — D631 Anemia in chronic kidney disease: Secondary | ICD-10-CM | POA: Diagnosis not present

## 2019-02-23 DIAGNOSIS — N2581 Secondary hyperparathyroidism of renal origin: Secondary | ICD-10-CM | POA: Diagnosis not present

## 2019-02-23 DIAGNOSIS — N186 End stage renal disease: Secondary | ICD-10-CM | POA: Diagnosis not present

## 2019-02-23 DIAGNOSIS — E1129 Type 2 diabetes mellitus with other diabetic kidney complication: Secondary | ICD-10-CM | POA: Diagnosis not present

## 2019-02-23 DIAGNOSIS — D631 Anemia in chronic kidney disease: Secondary | ICD-10-CM | POA: Diagnosis not present

## 2019-02-23 DIAGNOSIS — Z992 Dependence on renal dialysis: Secondary | ICD-10-CM | POA: Diagnosis not present

## 2019-02-23 DIAGNOSIS — D509 Iron deficiency anemia, unspecified: Secondary | ICD-10-CM | POA: Diagnosis not present

## 2019-02-26 DIAGNOSIS — D631 Anemia in chronic kidney disease: Secondary | ICD-10-CM | POA: Diagnosis not present

## 2019-02-26 DIAGNOSIS — N2581 Secondary hyperparathyroidism of renal origin: Secondary | ICD-10-CM | POA: Diagnosis not present

## 2019-02-26 DIAGNOSIS — D509 Iron deficiency anemia, unspecified: Secondary | ICD-10-CM | POA: Diagnosis not present

## 2019-02-26 DIAGNOSIS — E1129 Type 2 diabetes mellitus with other diabetic kidney complication: Secondary | ICD-10-CM | POA: Diagnosis not present

## 2019-02-26 DIAGNOSIS — Z992 Dependence on renal dialysis: Secondary | ICD-10-CM | POA: Diagnosis not present

## 2019-02-26 DIAGNOSIS — N186 End stage renal disease: Secondary | ICD-10-CM | POA: Diagnosis not present

## 2019-02-27 ENCOUNTER — Encounter: Payer: Self-pay | Admitting: Internal Medicine

## 2019-02-27 DIAGNOSIS — Z992 Dependence on renal dialysis: Secondary | ICD-10-CM | POA: Diagnosis not present

## 2019-02-27 DIAGNOSIS — N186 End stage renal disease: Secondary | ICD-10-CM | POA: Diagnosis not present

## 2019-02-27 DIAGNOSIS — I871 Compression of vein: Secondary | ICD-10-CM | POA: Diagnosis not present

## 2019-02-28 DIAGNOSIS — D631 Anemia in chronic kidney disease: Secondary | ICD-10-CM | POA: Diagnosis not present

## 2019-02-28 DIAGNOSIS — N2581 Secondary hyperparathyroidism of renal origin: Secondary | ICD-10-CM | POA: Diagnosis not present

## 2019-02-28 DIAGNOSIS — Z992 Dependence on renal dialysis: Secondary | ICD-10-CM | POA: Diagnosis not present

## 2019-02-28 DIAGNOSIS — N186 End stage renal disease: Secondary | ICD-10-CM | POA: Diagnosis not present

## 2019-02-28 DIAGNOSIS — E1129 Type 2 diabetes mellitus with other diabetic kidney complication: Secondary | ICD-10-CM | POA: Diagnosis not present

## 2019-02-28 DIAGNOSIS — D509 Iron deficiency anemia, unspecified: Secondary | ICD-10-CM | POA: Diagnosis not present

## 2019-03-02 DIAGNOSIS — N186 End stage renal disease: Secondary | ICD-10-CM | POA: Diagnosis not present

## 2019-03-02 DIAGNOSIS — Z992 Dependence on renal dialysis: Secondary | ICD-10-CM | POA: Diagnosis not present

## 2019-03-02 DIAGNOSIS — N2581 Secondary hyperparathyroidism of renal origin: Secondary | ICD-10-CM | POA: Diagnosis not present

## 2019-03-02 DIAGNOSIS — E1129 Type 2 diabetes mellitus with other diabetic kidney complication: Secondary | ICD-10-CM | POA: Diagnosis not present

## 2019-03-02 DIAGNOSIS — D631 Anemia in chronic kidney disease: Secondary | ICD-10-CM | POA: Diagnosis not present

## 2019-03-02 DIAGNOSIS — D509 Iron deficiency anemia, unspecified: Secondary | ICD-10-CM | POA: Diagnosis not present

## 2019-03-05 DIAGNOSIS — N2581 Secondary hyperparathyroidism of renal origin: Secondary | ICD-10-CM | POA: Diagnosis not present

## 2019-03-05 DIAGNOSIS — N186 End stage renal disease: Secondary | ICD-10-CM | POA: Diagnosis not present

## 2019-03-05 DIAGNOSIS — D509 Iron deficiency anemia, unspecified: Secondary | ICD-10-CM | POA: Diagnosis not present

## 2019-03-05 DIAGNOSIS — D631 Anemia in chronic kidney disease: Secondary | ICD-10-CM | POA: Diagnosis not present

## 2019-03-05 DIAGNOSIS — E1129 Type 2 diabetes mellitus with other diabetic kidney complication: Secondary | ICD-10-CM | POA: Diagnosis not present

## 2019-03-05 DIAGNOSIS — Z992 Dependence on renal dialysis: Secondary | ICD-10-CM | POA: Diagnosis not present

## 2019-03-06 DIAGNOSIS — M79641 Pain in right hand: Secondary | ICD-10-CM | POA: Diagnosis not present

## 2019-03-06 DIAGNOSIS — M13832 Other specified arthritis, left wrist: Secondary | ICD-10-CM | POA: Diagnosis not present

## 2019-03-07 DIAGNOSIS — E1129 Type 2 diabetes mellitus with other diabetic kidney complication: Secondary | ICD-10-CM | POA: Diagnosis not present

## 2019-03-07 DIAGNOSIS — D509 Iron deficiency anemia, unspecified: Secondary | ICD-10-CM | POA: Diagnosis not present

## 2019-03-07 DIAGNOSIS — N186 End stage renal disease: Secondary | ICD-10-CM | POA: Diagnosis not present

## 2019-03-07 DIAGNOSIS — N2581 Secondary hyperparathyroidism of renal origin: Secondary | ICD-10-CM | POA: Diagnosis not present

## 2019-03-07 DIAGNOSIS — D631 Anemia in chronic kidney disease: Secondary | ICD-10-CM | POA: Diagnosis not present

## 2019-03-07 DIAGNOSIS — Z992 Dependence on renal dialysis: Secondary | ICD-10-CM | POA: Diagnosis not present

## 2019-03-09 DIAGNOSIS — E1122 Type 2 diabetes mellitus with diabetic chronic kidney disease: Secondary | ICD-10-CM | POA: Diagnosis not present

## 2019-03-09 DIAGNOSIS — D631 Anemia in chronic kidney disease: Secondary | ICD-10-CM | POA: Diagnosis not present

## 2019-03-09 DIAGNOSIS — Z992 Dependence on renal dialysis: Secondary | ICD-10-CM | POA: Diagnosis not present

## 2019-03-09 DIAGNOSIS — N2581 Secondary hyperparathyroidism of renal origin: Secondary | ICD-10-CM | POA: Diagnosis not present

## 2019-03-09 DIAGNOSIS — N186 End stage renal disease: Secondary | ICD-10-CM | POA: Diagnosis not present

## 2019-03-09 DIAGNOSIS — E8770 Fluid overload, unspecified: Secondary | ICD-10-CM | POA: Diagnosis not present

## 2019-03-12 DIAGNOSIS — N186 End stage renal disease: Secondary | ICD-10-CM | POA: Diagnosis not present

## 2019-03-12 DIAGNOSIS — N2581 Secondary hyperparathyroidism of renal origin: Secondary | ICD-10-CM | POA: Diagnosis not present

## 2019-03-12 DIAGNOSIS — D631 Anemia in chronic kidney disease: Secondary | ICD-10-CM | POA: Diagnosis not present

## 2019-03-12 DIAGNOSIS — Z992 Dependence on renal dialysis: Secondary | ICD-10-CM | POA: Diagnosis not present

## 2019-03-12 DIAGNOSIS — E8770 Fluid overload, unspecified: Secondary | ICD-10-CM | POA: Diagnosis not present

## 2019-03-14 DIAGNOSIS — D631 Anemia in chronic kidney disease: Secondary | ICD-10-CM | POA: Diagnosis not present

## 2019-03-14 DIAGNOSIS — N186 End stage renal disease: Secondary | ICD-10-CM | POA: Diagnosis not present

## 2019-03-14 DIAGNOSIS — N2581 Secondary hyperparathyroidism of renal origin: Secondary | ICD-10-CM | POA: Diagnosis not present

## 2019-03-14 DIAGNOSIS — Z992 Dependence on renal dialysis: Secondary | ICD-10-CM | POA: Diagnosis not present

## 2019-03-14 DIAGNOSIS — E8770 Fluid overload, unspecified: Secondary | ICD-10-CM | POA: Diagnosis not present

## 2019-03-16 DIAGNOSIS — E8770 Fluid overload, unspecified: Secondary | ICD-10-CM | POA: Diagnosis not present

## 2019-03-16 DIAGNOSIS — N2581 Secondary hyperparathyroidism of renal origin: Secondary | ICD-10-CM | POA: Diagnosis not present

## 2019-03-16 DIAGNOSIS — N186 End stage renal disease: Secondary | ICD-10-CM | POA: Diagnosis not present

## 2019-03-16 DIAGNOSIS — Z992 Dependence on renal dialysis: Secondary | ICD-10-CM | POA: Diagnosis not present

## 2019-03-16 DIAGNOSIS — D631 Anemia in chronic kidney disease: Secondary | ICD-10-CM | POA: Diagnosis not present

## 2019-03-19 DIAGNOSIS — N2581 Secondary hyperparathyroidism of renal origin: Secondary | ICD-10-CM | POA: Diagnosis not present

## 2019-03-19 DIAGNOSIS — Z992 Dependence on renal dialysis: Secondary | ICD-10-CM | POA: Diagnosis not present

## 2019-03-19 DIAGNOSIS — E8770 Fluid overload, unspecified: Secondary | ICD-10-CM | POA: Diagnosis not present

## 2019-03-19 DIAGNOSIS — N186 End stage renal disease: Secondary | ICD-10-CM | POA: Diagnosis not present

## 2019-03-19 DIAGNOSIS — D631 Anemia in chronic kidney disease: Secondary | ICD-10-CM | POA: Diagnosis not present

## 2019-03-21 DIAGNOSIS — N186 End stage renal disease: Secondary | ICD-10-CM | POA: Diagnosis not present

## 2019-03-21 DIAGNOSIS — D631 Anemia in chronic kidney disease: Secondary | ICD-10-CM | POA: Diagnosis not present

## 2019-03-21 DIAGNOSIS — N2581 Secondary hyperparathyroidism of renal origin: Secondary | ICD-10-CM | POA: Diagnosis not present

## 2019-03-21 DIAGNOSIS — E8770 Fluid overload, unspecified: Secondary | ICD-10-CM | POA: Diagnosis not present

## 2019-03-21 DIAGNOSIS — Z992 Dependence on renal dialysis: Secondary | ICD-10-CM | POA: Diagnosis not present

## 2019-03-22 DIAGNOSIS — N186 End stage renal disease: Secondary | ICD-10-CM | POA: Diagnosis not present

## 2019-03-22 DIAGNOSIS — N2581 Secondary hyperparathyroidism of renal origin: Secondary | ICD-10-CM | POA: Diagnosis not present

## 2019-03-22 DIAGNOSIS — E8770 Fluid overload, unspecified: Secondary | ICD-10-CM | POA: Diagnosis not present

## 2019-03-22 DIAGNOSIS — D631 Anemia in chronic kidney disease: Secondary | ICD-10-CM | POA: Diagnosis not present

## 2019-03-22 DIAGNOSIS — Z992 Dependence on renal dialysis: Secondary | ICD-10-CM | POA: Diagnosis not present

## 2019-03-23 DIAGNOSIS — D631 Anemia in chronic kidney disease: Secondary | ICD-10-CM | POA: Diagnosis not present

## 2019-03-23 DIAGNOSIS — Z992 Dependence on renal dialysis: Secondary | ICD-10-CM | POA: Diagnosis not present

## 2019-03-23 DIAGNOSIS — E8770 Fluid overload, unspecified: Secondary | ICD-10-CM | POA: Diagnosis not present

## 2019-03-23 DIAGNOSIS — N2581 Secondary hyperparathyroidism of renal origin: Secondary | ICD-10-CM | POA: Diagnosis not present

## 2019-03-23 DIAGNOSIS — N186 End stage renal disease: Secondary | ICD-10-CM | POA: Diagnosis not present

## 2019-03-26 DIAGNOSIS — Z992 Dependence on renal dialysis: Secondary | ICD-10-CM | POA: Diagnosis not present

## 2019-03-26 DIAGNOSIS — E8770 Fluid overload, unspecified: Secondary | ICD-10-CM | POA: Diagnosis not present

## 2019-03-26 DIAGNOSIS — N2581 Secondary hyperparathyroidism of renal origin: Secondary | ICD-10-CM | POA: Diagnosis not present

## 2019-03-26 DIAGNOSIS — N186 End stage renal disease: Secondary | ICD-10-CM | POA: Diagnosis not present

## 2019-03-26 DIAGNOSIS — D631 Anemia in chronic kidney disease: Secondary | ICD-10-CM | POA: Diagnosis not present

## 2019-03-27 DIAGNOSIS — I10 Essential (primary) hypertension: Secondary | ICD-10-CM | POA: Diagnosis not present

## 2019-03-27 DIAGNOSIS — Z720 Tobacco use: Secondary | ICD-10-CM | POA: Diagnosis not present

## 2019-03-27 DIAGNOSIS — R51 Headache: Secondary | ICD-10-CM | POA: Diagnosis not present

## 2019-03-27 DIAGNOSIS — Z0001 Encounter for general adult medical examination with abnormal findings: Secondary | ICD-10-CM | POA: Diagnosis not present

## 2019-03-27 DIAGNOSIS — N186 End stage renal disease: Secondary | ICD-10-CM | POA: Diagnosis not present

## 2019-03-27 DIAGNOSIS — Z992 Dependence on renal dialysis: Secondary | ICD-10-CM | POA: Diagnosis not present

## 2019-03-27 DIAGNOSIS — E119 Type 2 diabetes mellitus without complications: Secondary | ICD-10-CM | POA: Diagnosis not present

## 2019-03-27 DIAGNOSIS — Z1389 Encounter for screening for other disorder: Secondary | ICD-10-CM | POA: Diagnosis not present

## 2019-03-27 DIAGNOSIS — E785 Hyperlipidemia, unspecified: Secondary | ICD-10-CM | POA: Diagnosis not present

## 2019-03-28 DIAGNOSIS — Z992 Dependence on renal dialysis: Secondary | ICD-10-CM | POA: Diagnosis not present

## 2019-03-28 DIAGNOSIS — E8770 Fluid overload, unspecified: Secondary | ICD-10-CM | POA: Diagnosis not present

## 2019-03-28 DIAGNOSIS — N186 End stage renal disease: Secondary | ICD-10-CM | POA: Diagnosis not present

## 2019-03-28 DIAGNOSIS — D631 Anemia in chronic kidney disease: Secondary | ICD-10-CM | POA: Diagnosis not present

## 2019-03-28 DIAGNOSIS — N2581 Secondary hyperparathyroidism of renal origin: Secondary | ICD-10-CM | POA: Diagnosis not present

## 2019-03-30 DIAGNOSIS — N2581 Secondary hyperparathyroidism of renal origin: Secondary | ICD-10-CM | POA: Diagnosis not present

## 2019-03-30 DIAGNOSIS — Z992 Dependence on renal dialysis: Secondary | ICD-10-CM | POA: Diagnosis not present

## 2019-03-30 DIAGNOSIS — N186 End stage renal disease: Secondary | ICD-10-CM | POA: Diagnosis not present

## 2019-03-30 DIAGNOSIS — E8770 Fluid overload, unspecified: Secondary | ICD-10-CM | POA: Diagnosis not present

## 2019-03-30 DIAGNOSIS — D631 Anemia in chronic kidney disease: Secondary | ICD-10-CM | POA: Diagnosis not present

## 2019-04-02 DIAGNOSIS — D631 Anemia in chronic kidney disease: Secondary | ICD-10-CM | POA: Diagnosis not present

## 2019-04-02 DIAGNOSIS — N186 End stage renal disease: Secondary | ICD-10-CM | POA: Diagnosis not present

## 2019-04-02 DIAGNOSIS — Z992 Dependence on renal dialysis: Secondary | ICD-10-CM | POA: Diagnosis not present

## 2019-04-02 DIAGNOSIS — N2581 Secondary hyperparathyroidism of renal origin: Secondary | ICD-10-CM | POA: Diagnosis not present

## 2019-04-02 DIAGNOSIS — E8770 Fluid overload, unspecified: Secondary | ICD-10-CM | POA: Diagnosis not present

## 2019-04-04 DIAGNOSIS — N2581 Secondary hyperparathyroidism of renal origin: Secondary | ICD-10-CM | POA: Diagnosis not present

## 2019-04-04 DIAGNOSIS — E8770 Fluid overload, unspecified: Secondary | ICD-10-CM | POA: Diagnosis not present

## 2019-04-04 DIAGNOSIS — Z992 Dependence on renal dialysis: Secondary | ICD-10-CM | POA: Diagnosis not present

## 2019-04-04 DIAGNOSIS — D631 Anemia in chronic kidney disease: Secondary | ICD-10-CM | POA: Diagnosis not present

## 2019-04-04 DIAGNOSIS — N186 End stage renal disease: Secondary | ICD-10-CM | POA: Diagnosis not present

## 2019-04-06 DIAGNOSIS — N186 End stage renal disease: Secondary | ICD-10-CM | POA: Diagnosis not present

## 2019-04-06 DIAGNOSIS — D631 Anemia in chronic kidney disease: Secondary | ICD-10-CM | POA: Diagnosis not present

## 2019-04-06 DIAGNOSIS — N2581 Secondary hyperparathyroidism of renal origin: Secondary | ICD-10-CM | POA: Diagnosis not present

## 2019-04-06 DIAGNOSIS — E8770 Fluid overload, unspecified: Secondary | ICD-10-CM | POA: Diagnosis not present

## 2019-04-06 DIAGNOSIS — Z992 Dependence on renal dialysis: Secondary | ICD-10-CM | POA: Diagnosis not present

## 2019-04-09 DIAGNOSIS — N2581 Secondary hyperparathyroidism of renal origin: Secondary | ICD-10-CM | POA: Diagnosis not present

## 2019-04-09 DIAGNOSIS — D631 Anemia in chronic kidney disease: Secondary | ICD-10-CM | POA: Diagnosis not present

## 2019-04-09 DIAGNOSIS — Z992 Dependence on renal dialysis: Secondary | ICD-10-CM | POA: Diagnosis not present

## 2019-04-09 DIAGNOSIS — E1122 Type 2 diabetes mellitus with diabetic chronic kidney disease: Secondary | ICD-10-CM | POA: Diagnosis not present

## 2019-04-09 DIAGNOSIS — N186 End stage renal disease: Secondary | ICD-10-CM | POA: Diagnosis not present

## 2019-04-09 DIAGNOSIS — Z23 Encounter for immunization: Secondary | ICD-10-CM | POA: Diagnosis not present

## 2019-04-11 DIAGNOSIS — Z992 Dependence on renal dialysis: Secondary | ICD-10-CM | POA: Diagnosis not present

## 2019-04-11 DIAGNOSIS — N186 End stage renal disease: Secondary | ICD-10-CM | POA: Diagnosis not present

## 2019-04-11 DIAGNOSIS — N2581 Secondary hyperparathyroidism of renal origin: Secondary | ICD-10-CM | POA: Diagnosis not present

## 2019-04-11 DIAGNOSIS — Z23 Encounter for immunization: Secondary | ICD-10-CM | POA: Diagnosis not present

## 2019-04-11 DIAGNOSIS — D631 Anemia in chronic kidney disease: Secondary | ICD-10-CM | POA: Diagnosis not present

## 2019-04-13 DIAGNOSIS — D631 Anemia in chronic kidney disease: Secondary | ICD-10-CM | POA: Diagnosis not present

## 2019-04-13 DIAGNOSIS — N186 End stage renal disease: Secondary | ICD-10-CM | POA: Diagnosis not present

## 2019-04-13 DIAGNOSIS — Z992 Dependence on renal dialysis: Secondary | ICD-10-CM | POA: Diagnosis not present

## 2019-04-13 DIAGNOSIS — Z23 Encounter for immunization: Secondary | ICD-10-CM | POA: Diagnosis not present

## 2019-04-13 DIAGNOSIS — N2581 Secondary hyperparathyroidism of renal origin: Secondary | ICD-10-CM | POA: Diagnosis not present

## 2019-04-16 DIAGNOSIS — Z992 Dependence on renal dialysis: Secondary | ICD-10-CM | POA: Diagnosis not present

## 2019-04-16 DIAGNOSIS — N186 End stage renal disease: Secondary | ICD-10-CM | POA: Diagnosis not present

## 2019-04-16 DIAGNOSIS — Z23 Encounter for immunization: Secondary | ICD-10-CM | POA: Diagnosis not present

## 2019-04-16 DIAGNOSIS — D631 Anemia in chronic kidney disease: Secondary | ICD-10-CM | POA: Diagnosis not present

## 2019-04-16 DIAGNOSIS — N2581 Secondary hyperparathyroidism of renal origin: Secondary | ICD-10-CM | POA: Diagnosis not present

## 2019-04-18 DIAGNOSIS — N2581 Secondary hyperparathyroidism of renal origin: Secondary | ICD-10-CM | POA: Diagnosis not present

## 2019-04-18 DIAGNOSIS — D631 Anemia in chronic kidney disease: Secondary | ICD-10-CM | POA: Diagnosis not present

## 2019-04-18 DIAGNOSIS — Z992 Dependence on renal dialysis: Secondary | ICD-10-CM | POA: Diagnosis not present

## 2019-04-18 DIAGNOSIS — N186 End stage renal disease: Secondary | ICD-10-CM | POA: Diagnosis not present

## 2019-04-18 DIAGNOSIS — Z23 Encounter for immunization: Secondary | ICD-10-CM | POA: Diagnosis not present

## 2019-04-20 DIAGNOSIS — D631 Anemia in chronic kidney disease: Secondary | ICD-10-CM | POA: Diagnosis not present

## 2019-04-20 DIAGNOSIS — N2581 Secondary hyperparathyroidism of renal origin: Secondary | ICD-10-CM | POA: Diagnosis not present

## 2019-04-20 DIAGNOSIS — N186 End stage renal disease: Secondary | ICD-10-CM | POA: Diagnosis not present

## 2019-04-20 DIAGNOSIS — Z23 Encounter for immunization: Secondary | ICD-10-CM | POA: Diagnosis not present

## 2019-04-20 DIAGNOSIS — Z992 Dependence on renal dialysis: Secondary | ICD-10-CM | POA: Diagnosis not present

## 2019-04-23 DIAGNOSIS — D631 Anemia in chronic kidney disease: Secondary | ICD-10-CM | POA: Diagnosis not present

## 2019-04-23 DIAGNOSIS — N2581 Secondary hyperparathyroidism of renal origin: Secondary | ICD-10-CM | POA: Diagnosis not present

## 2019-04-23 DIAGNOSIS — Z23 Encounter for immunization: Secondary | ICD-10-CM | POA: Diagnosis not present

## 2019-04-23 DIAGNOSIS — Z992 Dependence on renal dialysis: Secondary | ICD-10-CM | POA: Diagnosis not present

## 2019-04-23 DIAGNOSIS — N186 End stage renal disease: Secondary | ICD-10-CM | POA: Diagnosis not present

## 2019-04-25 DIAGNOSIS — N186 End stage renal disease: Secondary | ICD-10-CM | POA: Diagnosis not present

## 2019-04-25 DIAGNOSIS — D631 Anemia in chronic kidney disease: Secondary | ICD-10-CM | POA: Diagnosis not present

## 2019-04-25 DIAGNOSIS — N2581 Secondary hyperparathyroidism of renal origin: Secondary | ICD-10-CM | POA: Diagnosis not present

## 2019-04-25 DIAGNOSIS — Z23 Encounter for immunization: Secondary | ICD-10-CM | POA: Diagnosis not present

## 2019-04-25 DIAGNOSIS — Z992 Dependence on renal dialysis: Secondary | ICD-10-CM | POA: Diagnosis not present

## 2019-04-27 DIAGNOSIS — Z23 Encounter for immunization: Secondary | ICD-10-CM | POA: Diagnosis not present

## 2019-04-27 DIAGNOSIS — N186 End stage renal disease: Secondary | ICD-10-CM | POA: Diagnosis not present

## 2019-04-27 DIAGNOSIS — Z992 Dependence on renal dialysis: Secondary | ICD-10-CM | POA: Diagnosis not present

## 2019-04-27 DIAGNOSIS — N2581 Secondary hyperparathyroidism of renal origin: Secondary | ICD-10-CM | POA: Diagnosis not present

## 2019-04-27 DIAGNOSIS — D631 Anemia in chronic kidney disease: Secondary | ICD-10-CM | POA: Diagnosis not present

## 2019-04-30 DIAGNOSIS — N2581 Secondary hyperparathyroidism of renal origin: Secondary | ICD-10-CM | POA: Diagnosis not present

## 2019-04-30 DIAGNOSIS — D631 Anemia in chronic kidney disease: Secondary | ICD-10-CM | POA: Diagnosis not present

## 2019-04-30 DIAGNOSIS — N186 End stage renal disease: Secondary | ICD-10-CM | POA: Diagnosis not present

## 2019-04-30 DIAGNOSIS — Z23 Encounter for immunization: Secondary | ICD-10-CM | POA: Diagnosis not present

## 2019-04-30 DIAGNOSIS — Z992 Dependence on renal dialysis: Secondary | ICD-10-CM | POA: Diagnosis not present

## 2019-05-02 DIAGNOSIS — Z992 Dependence on renal dialysis: Secondary | ICD-10-CM | POA: Diagnosis not present

## 2019-05-02 DIAGNOSIS — N2581 Secondary hyperparathyroidism of renal origin: Secondary | ICD-10-CM | POA: Diagnosis not present

## 2019-05-02 DIAGNOSIS — Z23 Encounter for immunization: Secondary | ICD-10-CM | POA: Diagnosis not present

## 2019-05-02 DIAGNOSIS — N186 End stage renal disease: Secondary | ICD-10-CM | POA: Diagnosis not present

## 2019-05-02 DIAGNOSIS — D631 Anemia in chronic kidney disease: Secondary | ICD-10-CM | POA: Diagnosis not present

## 2019-05-04 DIAGNOSIS — N186 End stage renal disease: Secondary | ICD-10-CM | POA: Diagnosis not present

## 2019-05-04 DIAGNOSIS — Z23 Encounter for immunization: Secondary | ICD-10-CM | POA: Diagnosis not present

## 2019-05-04 DIAGNOSIS — D631 Anemia in chronic kidney disease: Secondary | ICD-10-CM | POA: Diagnosis not present

## 2019-05-04 DIAGNOSIS — N2581 Secondary hyperparathyroidism of renal origin: Secondary | ICD-10-CM | POA: Diagnosis not present

## 2019-05-04 DIAGNOSIS — Z992 Dependence on renal dialysis: Secondary | ICD-10-CM | POA: Diagnosis not present

## 2019-05-07 DIAGNOSIS — N2581 Secondary hyperparathyroidism of renal origin: Secondary | ICD-10-CM | POA: Diagnosis not present

## 2019-05-07 DIAGNOSIS — Z23 Encounter for immunization: Secondary | ICD-10-CM | POA: Diagnosis not present

## 2019-05-07 DIAGNOSIS — D631 Anemia in chronic kidney disease: Secondary | ICD-10-CM | POA: Diagnosis not present

## 2019-05-07 DIAGNOSIS — Z992 Dependence on renal dialysis: Secondary | ICD-10-CM | POA: Diagnosis not present

## 2019-05-07 DIAGNOSIS — N186 End stage renal disease: Secondary | ICD-10-CM | POA: Diagnosis not present

## 2019-05-09 DIAGNOSIS — E1122 Type 2 diabetes mellitus with diabetic chronic kidney disease: Secondary | ICD-10-CM | POA: Diagnosis not present

## 2019-05-09 DIAGNOSIS — D631 Anemia in chronic kidney disease: Secondary | ICD-10-CM | POA: Diagnosis not present

## 2019-05-09 DIAGNOSIS — Z992 Dependence on renal dialysis: Secondary | ICD-10-CM | POA: Diagnosis not present

## 2019-05-09 DIAGNOSIS — N186 End stage renal disease: Secondary | ICD-10-CM | POA: Diagnosis not present

## 2019-05-09 DIAGNOSIS — N2581 Secondary hyperparathyroidism of renal origin: Secondary | ICD-10-CM | POA: Diagnosis not present

## 2019-05-09 DIAGNOSIS — D509 Iron deficiency anemia, unspecified: Secondary | ICD-10-CM | POA: Diagnosis not present

## 2019-05-11 DIAGNOSIS — N2581 Secondary hyperparathyroidism of renal origin: Secondary | ICD-10-CM | POA: Diagnosis not present

## 2019-05-11 DIAGNOSIS — N186 End stage renal disease: Secondary | ICD-10-CM | POA: Diagnosis not present

## 2019-05-11 DIAGNOSIS — D631 Anemia in chronic kidney disease: Secondary | ICD-10-CM | POA: Diagnosis not present

## 2019-05-11 DIAGNOSIS — Z992 Dependence on renal dialysis: Secondary | ICD-10-CM | POA: Diagnosis not present

## 2019-05-11 DIAGNOSIS — D509 Iron deficiency anemia, unspecified: Secondary | ICD-10-CM | POA: Diagnosis not present

## 2019-05-14 DIAGNOSIS — D509 Iron deficiency anemia, unspecified: Secondary | ICD-10-CM | POA: Diagnosis not present

## 2019-05-14 DIAGNOSIS — N2581 Secondary hyperparathyroidism of renal origin: Secondary | ICD-10-CM | POA: Diagnosis not present

## 2019-05-14 DIAGNOSIS — D631 Anemia in chronic kidney disease: Secondary | ICD-10-CM | POA: Diagnosis not present

## 2019-05-14 DIAGNOSIS — Z992 Dependence on renal dialysis: Secondary | ICD-10-CM | POA: Diagnosis not present

## 2019-05-14 DIAGNOSIS — N186 End stage renal disease: Secondary | ICD-10-CM | POA: Diagnosis not present

## 2019-05-16 DIAGNOSIS — E1129 Type 2 diabetes mellitus with other diabetic kidney complication: Secondary | ICD-10-CM | POA: Diagnosis not present

## 2019-05-16 DIAGNOSIS — D631 Anemia in chronic kidney disease: Secondary | ICD-10-CM | POA: Diagnosis not present

## 2019-05-16 DIAGNOSIS — D509 Iron deficiency anemia, unspecified: Secondary | ICD-10-CM | POA: Diagnosis not present

## 2019-05-16 DIAGNOSIS — N186 End stage renal disease: Secondary | ICD-10-CM | POA: Diagnosis not present

## 2019-05-16 DIAGNOSIS — N2581 Secondary hyperparathyroidism of renal origin: Secondary | ICD-10-CM | POA: Diagnosis not present

## 2019-05-16 DIAGNOSIS — Z992 Dependence on renal dialysis: Secondary | ICD-10-CM | POA: Diagnosis not present

## 2019-05-18 DIAGNOSIS — Z992 Dependence on renal dialysis: Secondary | ICD-10-CM | POA: Diagnosis not present

## 2019-05-18 DIAGNOSIS — D631 Anemia in chronic kidney disease: Secondary | ICD-10-CM | POA: Diagnosis not present

## 2019-05-18 DIAGNOSIS — N186 End stage renal disease: Secondary | ICD-10-CM | POA: Diagnosis not present

## 2019-05-18 DIAGNOSIS — D509 Iron deficiency anemia, unspecified: Secondary | ICD-10-CM | POA: Diagnosis not present

## 2019-05-18 DIAGNOSIS — N2581 Secondary hyperparathyroidism of renal origin: Secondary | ICD-10-CM | POA: Diagnosis not present

## 2019-05-21 DIAGNOSIS — D631 Anemia in chronic kidney disease: Secondary | ICD-10-CM | POA: Diagnosis not present

## 2019-05-21 DIAGNOSIS — N186 End stage renal disease: Secondary | ICD-10-CM | POA: Diagnosis not present

## 2019-05-21 DIAGNOSIS — D509 Iron deficiency anemia, unspecified: Secondary | ICD-10-CM | POA: Diagnosis not present

## 2019-05-21 DIAGNOSIS — Z992 Dependence on renal dialysis: Secondary | ICD-10-CM | POA: Diagnosis not present

## 2019-05-21 DIAGNOSIS — N2581 Secondary hyperparathyroidism of renal origin: Secondary | ICD-10-CM | POA: Diagnosis not present

## 2019-05-23 DIAGNOSIS — D509 Iron deficiency anemia, unspecified: Secondary | ICD-10-CM | POA: Diagnosis not present

## 2019-05-23 DIAGNOSIS — N2581 Secondary hyperparathyroidism of renal origin: Secondary | ICD-10-CM | POA: Diagnosis not present

## 2019-05-23 DIAGNOSIS — Z992 Dependence on renal dialysis: Secondary | ICD-10-CM | POA: Diagnosis not present

## 2019-05-23 DIAGNOSIS — N186 End stage renal disease: Secondary | ICD-10-CM | POA: Diagnosis not present

## 2019-05-23 DIAGNOSIS — D631 Anemia in chronic kidney disease: Secondary | ICD-10-CM | POA: Diagnosis not present

## 2019-05-25 DIAGNOSIS — D509 Iron deficiency anemia, unspecified: Secondary | ICD-10-CM | POA: Diagnosis not present

## 2019-05-25 DIAGNOSIS — D631 Anemia in chronic kidney disease: Secondary | ICD-10-CM | POA: Diagnosis not present

## 2019-05-25 DIAGNOSIS — N186 End stage renal disease: Secondary | ICD-10-CM | POA: Diagnosis not present

## 2019-05-25 DIAGNOSIS — Z992 Dependence on renal dialysis: Secondary | ICD-10-CM | POA: Diagnosis not present

## 2019-05-25 DIAGNOSIS — N2581 Secondary hyperparathyroidism of renal origin: Secondary | ICD-10-CM | POA: Diagnosis not present

## 2019-05-28 DIAGNOSIS — N2581 Secondary hyperparathyroidism of renal origin: Secondary | ICD-10-CM | POA: Diagnosis not present

## 2019-05-28 DIAGNOSIS — N186 End stage renal disease: Secondary | ICD-10-CM | POA: Diagnosis not present

## 2019-05-28 DIAGNOSIS — D509 Iron deficiency anemia, unspecified: Secondary | ICD-10-CM | POA: Diagnosis not present

## 2019-05-28 DIAGNOSIS — Z992 Dependence on renal dialysis: Secondary | ICD-10-CM | POA: Diagnosis not present

## 2019-05-28 DIAGNOSIS — D631 Anemia in chronic kidney disease: Secondary | ICD-10-CM | POA: Diagnosis not present

## 2019-05-30 DIAGNOSIS — D631 Anemia in chronic kidney disease: Secondary | ICD-10-CM | POA: Diagnosis not present

## 2019-05-30 DIAGNOSIS — D509 Iron deficiency anemia, unspecified: Secondary | ICD-10-CM | POA: Diagnosis not present

## 2019-05-30 DIAGNOSIS — N2581 Secondary hyperparathyroidism of renal origin: Secondary | ICD-10-CM | POA: Diagnosis not present

## 2019-05-30 DIAGNOSIS — N186 End stage renal disease: Secondary | ICD-10-CM | POA: Diagnosis not present

## 2019-05-30 DIAGNOSIS — Z992 Dependence on renal dialysis: Secondary | ICD-10-CM | POA: Diagnosis not present

## 2019-06-01 DIAGNOSIS — D631 Anemia in chronic kidney disease: Secondary | ICD-10-CM | POA: Diagnosis not present

## 2019-06-01 DIAGNOSIS — N2581 Secondary hyperparathyroidism of renal origin: Secondary | ICD-10-CM | POA: Diagnosis not present

## 2019-06-01 DIAGNOSIS — N186 End stage renal disease: Secondary | ICD-10-CM | POA: Diagnosis not present

## 2019-06-01 DIAGNOSIS — Z992 Dependence on renal dialysis: Secondary | ICD-10-CM | POA: Diagnosis not present

## 2019-06-01 DIAGNOSIS — D509 Iron deficiency anemia, unspecified: Secondary | ICD-10-CM | POA: Diagnosis not present

## 2019-06-04 DIAGNOSIS — N2581 Secondary hyperparathyroidism of renal origin: Secondary | ICD-10-CM | POA: Diagnosis not present

## 2019-06-04 DIAGNOSIS — D509 Iron deficiency anemia, unspecified: Secondary | ICD-10-CM | POA: Diagnosis not present

## 2019-06-04 DIAGNOSIS — Z992 Dependence on renal dialysis: Secondary | ICD-10-CM | POA: Diagnosis not present

## 2019-06-04 DIAGNOSIS — N186 End stage renal disease: Secondary | ICD-10-CM | POA: Diagnosis not present

## 2019-06-04 DIAGNOSIS — D631 Anemia in chronic kidney disease: Secondary | ICD-10-CM | POA: Diagnosis not present

## 2019-06-06 DIAGNOSIS — D509 Iron deficiency anemia, unspecified: Secondary | ICD-10-CM | POA: Diagnosis not present

## 2019-06-06 DIAGNOSIS — Z992 Dependence on renal dialysis: Secondary | ICD-10-CM | POA: Diagnosis not present

## 2019-06-06 DIAGNOSIS — N2581 Secondary hyperparathyroidism of renal origin: Secondary | ICD-10-CM | POA: Diagnosis not present

## 2019-06-06 DIAGNOSIS — D631 Anemia in chronic kidney disease: Secondary | ICD-10-CM | POA: Diagnosis not present

## 2019-06-06 DIAGNOSIS — N186 End stage renal disease: Secondary | ICD-10-CM | POA: Diagnosis not present

## 2019-06-08 DIAGNOSIS — Z992 Dependence on renal dialysis: Secondary | ICD-10-CM | POA: Diagnosis not present

## 2019-06-08 DIAGNOSIS — N186 End stage renal disease: Secondary | ICD-10-CM | POA: Diagnosis not present

## 2019-06-08 DIAGNOSIS — D509 Iron deficiency anemia, unspecified: Secondary | ICD-10-CM | POA: Diagnosis not present

## 2019-06-08 DIAGNOSIS — D631 Anemia in chronic kidney disease: Secondary | ICD-10-CM | POA: Diagnosis not present

## 2019-06-08 DIAGNOSIS — N2581 Secondary hyperparathyroidism of renal origin: Secondary | ICD-10-CM | POA: Diagnosis not present

## 2019-06-09 DIAGNOSIS — N186 End stage renal disease: Secondary | ICD-10-CM | POA: Diagnosis not present

## 2019-06-09 DIAGNOSIS — Z992 Dependence on renal dialysis: Secondary | ICD-10-CM | POA: Diagnosis not present

## 2019-06-09 DIAGNOSIS — E1122 Type 2 diabetes mellitus with diabetic chronic kidney disease: Secondary | ICD-10-CM | POA: Diagnosis not present

## 2019-06-11 DIAGNOSIS — N186 End stage renal disease: Secondary | ICD-10-CM | POA: Diagnosis not present

## 2019-06-11 DIAGNOSIS — D631 Anemia in chronic kidney disease: Secondary | ICD-10-CM | POA: Diagnosis not present

## 2019-06-11 DIAGNOSIS — D509 Iron deficiency anemia, unspecified: Secondary | ICD-10-CM | POA: Diagnosis not present

## 2019-06-11 DIAGNOSIS — Z992 Dependence on renal dialysis: Secondary | ICD-10-CM | POA: Diagnosis not present

## 2019-06-11 DIAGNOSIS — N2581 Secondary hyperparathyroidism of renal origin: Secondary | ICD-10-CM | POA: Diagnosis not present

## 2019-06-13 DIAGNOSIS — N2581 Secondary hyperparathyroidism of renal origin: Secondary | ICD-10-CM | POA: Diagnosis not present

## 2019-06-13 DIAGNOSIS — D631 Anemia in chronic kidney disease: Secondary | ICD-10-CM | POA: Diagnosis not present

## 2019-06-13 DIAGNOSIS — D509 Iron deficiency anemia, unspecified: Secondary | ICD-10-CM | POA: Diagnosis not present

## 2019-06-13 DIAGNOSIS — Z992 Dependence on renal dialysis: Secondary | ICD-10-CM | POA: Diagnosis not present

## 2019-06-13 DIAGNOSIS — N186 End stage renal disease: Secondary | ICD-10-CM | POA: Diagnosis not present

## 2019-06-14 DIAGNOSIS — Z992 Dependence on renal dialysis: Secondary | ICD-10-CM | POA: Diagnosis not present

## 2019-06-14 DIAGNOSIS — E785 Hyperlipidemia, unspecified: Secondary | ICD-10-CM | POA: Diagnosis not present

## 2019-06-14 DIAGNOSIS — E119 Type 2 diabetes mellitus without complications: Secondary | ICD-10-CM | POA: Diagnosis not present

## 2019-06-14 DIAGNOSIS — Z720 Tobacco use: Secondary | ICD-10-CM | POA: Diagnosis not present

## 2019-06-14 DIAGNOSIS — N186 End stage renal disease: Secondary | ICD-10-CM | POA: Diagnosis not present

## 2019-06-14 DIAGNOSIS — I10 Essential (primary) hypertension: Secondary | ICD-10-CM | POA: Diagnosis not present

## 2019-06-15 DIAGNOSIS — N186 End stage renal disease: Secondary | ICD-10-CM | POA: Diagnosis not present

## 2019-06-15 DIAGNOSIS — D631 Anemia in chronic kidney disease: Secondary | ICD-10-CM | POA: Diagnosis not present

## 2019-06-15 DIAGNOSIS — Z992 Dependence on renal dialysis: Secondary | ICD-10-CM | POA: Diagnosis not present

## 2019-06-15 DIAGNOSIS — N2581 Secondary hyperparathyroidism of renal origin: Secondary | ICD-10-CM | POA: Diagnosis not present

## 2019-06-15 DIAGNOSIS — D509 Iron deficiency anemia, unspecified: Secondary | ICD-10-CM | POA: Diagnosis not present

## 2019-06-18 DIAGNOSIS — N186 End stage renal disease: Secondary | ICD-10-CM | POA: Diagnosis not present

## 2019-06-18 DIAGNOSIS — D509 Iron deficiency anemia, unspecified: Secondary | ICD-10-CM | POA: Diagnosis not present

## 2019-06-18 DIAGNOSIS — D631 Anemia in chronic kidney disease: Secondary | ICD-10-CM | POA: Diagnosis not present

## 2019-06-18 DIAGNOSIS — N2581 Secondary hyperparathyroidism of renal origin: Secondary | ICD-10-CM | POA: Diagnosis not present

## 2019-06-18 DIAGNOSIS — Z992 Dependence on renal dialysis: Secondary | ICD-10-CM | POA: Diagnosis not present

## 2019-06-20 DIAGNOSIS — D509 Iron deficiency anemia, unspecified: Secondary | ICD-10-CM | POA: Diagnosis not present

## 2019-06-20 DIAGNOSIS — N2581 Secondary hyperparathyroidism of renal origin: Secondary | ICD-10-CM | POA: Diagnosis not present

## 2019-06-20 DIAGNOSIS — D631 Anemia in chronic kidney disease: Secondary | ICD-10-CM | POA: Diagnosis not present

## 2019-06-20 DIAGNOSIS — N186 End stage renal disease: Secondary | ICD-10-CM | POA: Diagnosis not present

## 2019-06-20 DIAGNOSIS — Z992 Dependence on renal dialysis: Secondary | ICD-10-CM | POA: Diagnosis not present

## 2019-06-22 DIAGNOSIS — Z992 Dependence on renal dialysis: Secondary | ICD-10-CM | POA: Diagnosis not present

## 2019-06-22 DIAGNOSIS — D631 Anemia in chronic kidney disease: Secondary | ICD-10-CM | POA: Diagnosis not present

## 2019-06-22 DIAGNOSIS — D509 Iron deficiency anemia, unspecified: Secondary | ICD-10-CM | POA: Diagnosis not present

## 2019-06-22 DIAGNOSIS — N2581 Secondary hyperparathyroidism of renal origin: Secondary | ICD-10-CM | POA: Diagnosis not present

## 2019-06-22 DIAGNOSIS — N186 End stage renal disease: Secondary | ICD-10-CM | POA: Diagnosis not present

## 2019-06-25 DIAGNOSIS — N186 End stage renal disease: Secondary | ICD-10-CM | POA: Diagnosis not present

## 2019-06-25 DIAGNOSIS — D509 Iron deficiency anemia, unspecified: Secondary | ICD-10-CM | POA: Diagnosis not present

## 2019-06-25 DIAGNOSIS — N2581 Secondary hyperparathyroidism of renal origin: Secondary | ICD-10-CM | POA: Diagnosis not present

## 2019-06-25 DIAGNOSIS — Z992 Dependence on renal dialysis: Secondary | ICD-10-CM | POA: Diagnosis not present

## 2019-06-25 DIAGNOSIS — D631 Anemia in chronic kidney disease: Secondary | ICD-10-CM | POA: Diagnosis not present

## 2019-06-27 DIAGNOSIS — N2581 Secondary hyperparathyroidism of renal origin: Secondary | ICD-10-CM | POA: Diagnosis not present

## 2019-06-27 DIAGNOSIS — D631 Anemia in chronic kidney disease: Secondary | ICD-10-CM | POA: Diagnosis not present

## 2019-06-27 DIAGNOSIS — Z992 Dependence on renal dialysis: Secondary | ICD-10-CM | POA: Diagnosis not present

## 2019-06-27 DIAGNOSIS — N186 End stage renal disease: Secondary | ICD-10-CM | POA: Diagnosis not present

## 2019-06-27 DIAGNOSIS — D509 Iron deficiency anemia, unspecified: Secondary | ICD-10-CM | POA: Diagnosis not present

## 2019-06-29 DIAGNOSIS — Z992 Dependence on renal dialysis: Secondary | ICD-10-CM | POA: Diagnosis not present

## 2019-06-29 DIAGNOSIS — N186 End stage renal disease: Secondary | ICD-10-CM | POA: Diagnosis not present

## 2019-06-29 DIAGNOSIS — N2581 Secondary hyperparathyroidism of renal origin: Secondary | ICD-10-CM | POA: Diagnosis not present

## 2019-06-29 DIAGNOSIS — D509 Iron deficiency anemia, unspecified: Secondary | ICD-10-CM | POA: Diagnosis not present

## 2019-06-29 DIAGNOSIS — D631 Anemia in chronic kidney disease: Secondary | ICD-10-CM | POA: Diagnosis not present

## 2019-07-01 DIAGNOSIS — D631 Anemia in chronic kidney disease: Secondary | ICD-10-CM | POA: Diagnosis not present

## 2019-07-01 DIAGNOSIS — D509 Iron deficiency anemia, unspecified: Secondary | ICD-10-CM | POA: Diagnosis not present

## 2019-07-01 DIAGNOSIS — N2581 Secondary hyperparathyroidism of renal origin: Secondary | ICD-10-CM | POA: Diagnosis not present

## 2019-07-01 DIAGNOSIS — N186 End stage renal disease: Secondary | ICD-10-CM | POA: Diagnosis not present

## 2019-07-01 DIAGNOSIS — Z992 Dependence on renal dialysis: Secondary | ICD-10-CM | POA: Diagnosis not present

## 2019-07-03 DIAGNOSIS — N186 End stage renal disease: Secondary | ICD-10-CM | POA: Diagnosis not present

## 2019-07-03 DIAGNOSIS — Z992 Dependence on renal dialysis: Secondary | ICD-10-CM | POA: Diagnosis not present

## 2019-07-03 DIAGNOSIS — D631 Anemia in chronic kidney disease: Secondary | ICD-10-CM | POA: Diagnosis not present

## 2019-07-03 DIAGNOSIS — D509 Iron deficiency anemia, unspecified: Secondary | ICD-10-CM | POA: Diagnosis not present

## 2019-07-03 DIAGNOSIS — N2581 Secondary hyperparathyroidism of renal origin: Secondary | ICD-10-CM | POA: Diagnosis not present

## 2019-07-06 DIAGNOSIS — N2581 Secondary hyperparathyroidism of renal origin: Secondary | ICD-10-CM | POA: Diagnosis not present

## 2019-07-06 DIAGNOSIS — N186 End stage renal disease: Secondary | ICD-10-CM | POA: Diagnosis not present

## 2019-07-06 DIAGNOSIS — D631 Anemia in chronic kidney disease: Secondary | ICD-10-CM | POA: Diagnosis not present

## 2019-07-06 DIAGNOSIS — Z992 Dependence on renal dialysis: Secondary | ICD-10-CM | POA: Diagnosis not present

## 2019-07-06 DIAGNOSIS — D509 Iron deficiency anemia, unspecified: Secondary | ICD-10-CM | POA: Diagnosis not present

## 2019-07-12 ENCOUNTER — Encounter (INDEPENDENT_AMBULATORY_CARE_PROVIDER_SITE_OTHER): Payer: Medicare Other | Admitting: Ophthalmology

## 2019-07-28 ENCOUNTER — Encounter (HOSPITAL_COMMUNITY): Payer: Self-pay | Admitting: Emergency Medicine

## 2019-07-28 ENCOUNTER — Other Ambulatory Visit: Payer: Self-pay

## 2019-07-28 ENCOUNTER — Emergency Department (HOSPITAL_COMMUNITY): Payer: Medicare Other

## 2019-07-28 ENCOUNTER — Emergency Department (HOSPITAL_COMMUNITY)
Admission: EM | Admit: 2019-07-28 | Discharge: 2019-07-29 | Disposition: A | Discharge status: Home / Self Care | Payer: Medicare Other | Attending: Emergency Medicine | Admitting: Emergency Medicine

## 2019-07-28 DIAGNOSIS — Z20822 Contact with and (suspected) exposure to covid-19: Secondary | ICD-10-CM

## 2019-07-28 DIAGNOSIS — Z79899 Other long term (current) drug therapy: Secondary | ICD-10-CM | POA: Insufficient documentation

## 2019-07-28 DIAGNOSIS — I132 Hypertensive heart and chronic kidney disease with heart failure and with stage 5 chronic kidney disease, or end stage renal disease: Secondary | ICD-10-CM | POA: Insufficient documentation

## 2019-07-28 DIAGNOSIS — E1122 Type 2 diabetes mellitus with diabetic chronic kidney disease: Secondary | ICD-10-CM | POA: Insufficient documentation

## 2019-07-28 DIAGNOSIS — N186 End stage renal disease: Secondary | ICD-10-CM | POA: Diagnosis not present

## 2019-07-28 DIAGNOSIS — Z794 Long term (current) use of insulin: Secondary | ICD-10-CM | POA: Diagnosis not present

## 2019-07-28 DIAGNOSIS — I2510 Atherosclerotic heart disease of native coronary artery without angina pectoris: Secondary | ICD-10-CM | POA: Insufficient documentation

## 2019-07-28 DIAGNOSIS — U071 COVID-19: Secondary | ICD-10-CM | POA: Insufficient documentation

## 2019-07-28 DIAGNOSIS — Z992 Dependence on renal dialysis: Secondary | ICD-10-CM | POA: Insufficient documentation

## 2019-07-28 DIAGNOSIS — I5042 Chronic combined systolic (congestive) and diastolic (congestive) heart failure: Secondary | ICD-10-CM | POA: Diagnosis not present

## 2019-07-28 DIAGNOSIS — Z87891 Personal history of nicotine dependence: Secondary | ICD-10-CM | POA: Diagnosis not present

## 2019-07-28 DIAGNOSIS — R05 Cough: Secondary | ICD-10-CM | POA: Diagnosis present

## 2019-07-28 DIAGNOSIS — I10 Essential (primary) hypertension: Secondary | ICD-10-CM

## 2019-07-28 DIAGNOSIS — D72818 Other decreased white blood cell count: Secondary | ICD-10-CM

## 2019-07-28 LAB — COMPREHENSIVE METABOLIC PANEL
ALT: 12 U/L (ref 0–44)
AST: 19 U/L (ref 15–41)
Albumin: 3.4 g/dL — ABNORMAL LOW (ref 3.5–5.0)
Alkaline Phosphatase: 68 U/L (ref 38–126)
Anion gap: 14 (ref 5–15)
BUN: 25 mg/dL — ABNORMAL HIGH (ref 8–23)
CO2: 28 mmol/L (ref 22–32)
Calcium: 8.9 mg/dL (ref 8.9–10.3)
Chloride: 95 mmol/L — ABNORMAL LOW (ref 98–111)
Creatinine, Ser: 5.46 mg/dL — ABNORMAL HIGH (ref 0.44–1.00)
GFR calc Af Amer: 8 mL/min — ABNORMAL LOW (ref >60)
GFR calc non Af Amer: 7 mL/min — ABNORMAL LOW (ref >60)
Glucose, Bld: 142 mg/dL — ABNORMAL HIGH (ref 70–99)
Potassium: 3.7 mmol/L (ref 3.5–5.1)
Sodium: 137 mmol/L (ref 135–145)
Total Bilirubin: 0.5 mg/dL (ref 0.3–1.2)
Total Protein: 6.8 g/dL (ref 6.5–8.1)

## 2019-07-28 LAB — CBC WITH DIFFERENTIAL/PLATELET
Abs Immature Granulocytes: 0.03 10*3/uL (ref 0.00–0.07)
Basophils Absolute: 0 10*3/uL (ref 0.0–0.1)
Basophils Relative: 0 %
Eosinophils Absolute: 0.2 10*3/uL (ref 0.0–0.5)
Eosinophils Relative: 7 %
HCT: 38.7 % (ref 36.0–46.0)
Hemoglobin: 12 g/dL (ref 12.0–15.0)
Immature Granulocytes: 1 %
Lymphocytes Relative: 36 %
Lymphs Abs: 1.2 10*3/uL (ref 0.7–4.0)
MCH: 30.4 pg (ref 26.0–34.0)
MCHC: 31 g/dL (ref 30.0–36.0)
MCV: 98 fL (ref 80.0–100.0)
Monocytes Absolute: 0.4 10*3/uL (ref 0.1–1.0)
Monocytes Relative: 13 %
Neutro Abs: 1.4 10*3/uL — ABNORMAL LOW (ref 1.7–7.7)
Neutrophils Relative %: 43 %
Platelets: 221 10*3/uL (ref 150–400)
RBC: 3.95 MIL/uL (ref 3.87–5.11)
RDW: 15.8 % — ABNORMAL HIGH (ref 11.5–15.5)
WBC: 3.2 10*3/uL — ABNORMAL LOW (ref 4.0–10.5)
nRBC: 0 % (ref 0.0–0.2)

## 2019-07-28 NOTE — ED Notes (Signed)
Pt son is [place] [phone] (primary contact)

## 2019-07-28 NOTE — ED Triage Notes (Signed)
Pt reports cough, diarrhea and nausea. States her daughter has covid and she was around her prior to knowing she was positive.

## 2019-07-29 ENCOUNTER — Other Ambulatory Visit: Payer: Self-pay

## 2019-07-29 DIAGNOSIS — U071 COVID-19: Secondary | ICD-10-CM | POA: Diagnosis not present

## 2019-07-29 LAB — SARS CORONAVIRUS 2 (TAT 6-24 HRS): SARS Coronavirus 2: POSITIVE — AB

## 2019-07-29 NOTE — ED Provider Notes (Signed)
[facility] EMERGENCY DEPARTMENT Provider Note   CSN: [id number] Arrival date & time: 07/28/19  2141   History Chief Complaint  Patient presents with  . Cough  . Nausea  . Diarrhea    Kathleen [name] is a 73 y.o. female.  The history is provided by the patient.  Cough Diarrhea She has history of hypertension, hyperlipidemia, end-stage renal disease on hemodialysis and comes in because of cough and exposure to COVID-19.  She states her daughter was diagnosed with COVID-19 about 1 week ago.  There was close contact with her daughter prior to her being diagnosed, but there has been no contact since then.  2 days ago, patient started having a dry cough.  She denies nasal congestion, sore throat, change in sense of smell or taste.  She denies shortness of breath, nausea, vomiting.  She states her stools have been loose but she has not actually had any diarrhea.  There have been no arthralgias or myalgias.  She has not missed any dialysis sessions (she gets dialysis every Tuesday-Thursday-Saturday).  She does state that she did not take her blood pressure medication tonight.  Past Medical History:  Diagnosis Date  . Anemia    a. 01/2014: suspected of chronic disease, negative FOBT.  [name]    knees  . Asthma    many years ago  . CAD (coronary artery disease)    a. Cath 01/2014: mild in LAD, mild-mod LCx, mod-severe RCA - for med rx.  . Diabetes mellitus without complication ([place])   . ESRD (end stage renal disease) ([place])    [place]  . History of echocardiogram    Echo (10/15):  Mod LVH, EF 50-55%, Gr 1 DD, MAC, mild MR, mild TR, PASP 35 mmHg  . Hx of transfusion   . Hyperlipidemia   . Hypertension   . Meningioma ([place])    a. Incidental dx 01/2014.  . Obesity   . Renal insufficiency   . Systolic CHF ([place])    a. 0/3491: dx with mixed ICM/NICM (out of proportion to CAD) EF 25-30% by echo.     Patient Active Problem List   Diagnosis Date  Noted  . Pulmonary edema 01/29/2019  . Hyperkalemia 01/29/2019  . Acute on chronic diastolic CHF (congestive heart failure) ([place]) 01/29/2019  . Acute respiratory failure with hypoxia ([place]) 01/29/2019  . Hematochezia   . Benign neoplasm of cecum   . Benign neoplasm of ascending colon   . Benign neoplasm of sigmoid colon   . Gastritis and gastroduodenitis   . Rectal bleeding 09/01/2018  . CVA (cerebral vascular accident) ([place]) 11/09/2017  . Acute CVA (cerebrovascular accident) ([place]) 11/08/2017  . DM (diabetes mellitus), type 2 with renal complications ([employer]) 79/15/0569  . Altered mental status 07/14/2017  . Knee pain   . Acute lower UTI   . Peripheral arterial disease ([place]) 02/23/2016  . Insulin dependent diabetes mellitus 12/18/2015  . Anemia in chronic kidney disease 12/18/2015  . Dyspnea   . Goals of care, counseling/discussion   . SOB (shortness of breath)   . Encounter for palliative care   . Pulmonary vascular congestion   . Pleural effusion, left   . HLD (hyperlipidemia)   . Noncompliance with medication regimen   . Disorientation   . Somnolence   . Acute respiratory failure with hypercapnia ([place])   . Respiratory acidosis   . Hypernatremia   . Systolic and diastolic CHF, acute ([place])   . CHF, acute on  chronic ([place]) 08/05/2015  . Elevated troponin 08/05/2015  . CHF (congestive heart failure) ([place]) 08/05/2015  . Acute on chronic heart failure ([place]) 08/05/2015  . Acute on chronic combined systolic and diastolic congestive heart failure ([place])   . Left carotid bruit 11/20/2014  . Syncope and collapse 06/10/2014  . Syncope 06/10/2014  . Essential hypertension   . Unspecified vitamin D deficiency   . Cellulitis and abscess of right lower extremitiy 02/28/2014  . Chronic combined systolic and diastolic heart failure ([place]) 02/12/2014  . CAD (coronary artery disease)   . Obesity   . Meningioma ([place])   . Anemia   . ESRD on dialysis ([place]) 01/24/2014  . Hyperlipidemia  01/22/2014  . BPPV (benign paroxysmal positional vertigo) 01/22/2014  . Hypertensive urgency 01/21/2014  . Tobacco abuse 01/21/2014  . Diabetes mellitus due to abnormal insulin ([place]) 01/21/2014    Past Surgical History:  Procedure Laterality Date  . ABDOMINAL HYSTERECTOMY    . AV FISTULA PLACEMENT Left 02/10/2017   Procedure: ARTERIOVENOUS (AV) FISTULA CREATION-LEFT FOREARM;  Surgeon: Elam Dutch, MD;  Location: [place];  Service: Vascular;  Laterality: Left;  . BIOPSY  09/04/2018   Procedure: BIOPSY;  Surgeon: Jerene Bears, MD;  Location: [facility] ENDOSCOPY;  Service: Gastroenterology;;  . BREAST SURGERY Right    Lumpectomy  . COLONOSCOPY    . COLONOSCOPY WITH PROPOFOL N/A 09/04/2018   Procedure: COLONOSCOPY WITH PROPOFOL;  Surgeon: Jerene Bears, MD;  Location: [place];  Service: Gastroenterology;  Laterality: N/A;  . ESOPHAGOGASTRODUODENOSCOPY (EGD) WITH PROPOFOL N/A 09/04/2018   Procedure: ESOPHAGOGASTRODUODENOSCOPY (EGD) WITH PROPOFOL;  Surgeon: Jerene Bears, MD;  Location: [facility] ENDOSCOPY;  Service: Gastroenterology;  Laterality: N/A;  . FISTULA SUPERFICIALIZATION Left 08/09/2017   Procedure: FISTULA SUPERFICIALIZATION LEFT ARM ARTERIOVENOUS FISTULA;  Surgeon: Conrad Granville, MD;  Location: [place];  Service: Vascular;  Laterality: Left;  . INSERTION OF DIALYSIS CATHETER Right 02/10/2017   Procedure: INSERTION OF DIALYSIS CATHETER;  Surgeon: Elam Dutch, MD;  Location: [place];  Service: Vascular;  Laterality: Right;  . LEFT HEART CATHETERIZATION WITH CORONARY ANGIOGRAM N/A 01/27/2014   Procedure: LEFT HEART CATHETERIZATION WITH CORONARY ANGIOGRAM;  Surgeon: Jettie Booze, MD;  Location: [facility] CATH LAB;  Service: Cardiovascular;  Laterality: N/A;  . POLYPECTOMY  09/04/2018   Procedure: POLYPECTOMY;  Surgeon: Jerene Bears, MD;  Location: [facility] ENDOSCOPY;  Service: Gastroenterology;;  . TONSILLECTOMY       OB History   No obstetric history on file.     Family History  Problem  Relation Age of Onset  . Diabetic kidney disease Mother   . Hypertension Mother   . Heart failure Mother   . Heart attack Mother   . Hyperlipidemia Mother   . Hypertension Father     Social History   Tobacco Use  . Smoking status: Former Smoker    Packs/day: 0.25    Years: 30.00    Pack years: 7.50    Types: Cigarettes  . Smokeless tobacco: Never Used  . Tobacco comment: quit late 1990's  Substance Use Topics  . Alcohol use: No  . Drug use: No    Home Medications Prior to Admission medications   Medication Sig Start Date End Date Taking? Authorizing Provider  acetaminophen (TYLENOL) 325 MG tablet Take 2 tablets (650 mg total) by mouth every 4 (four) hours as needed for mild pain (or temp > 37.5 C (99.5 F)). 11/11/17   [name], MD  aspirin 81 MG EC  tablet Take 4 tablets (325 mg total) by mouth daily. Patient taking differently: Take 162 mg by mouth daily.  11/11/17   [name], MD  atorvastatin (LIPITOR) 40 MG tablet Take 1 tablet (40 mg total) by mouth daily at 6 PM. Patient not taking: Reported on 01/29/2019 07/19/17   Aline August, MD  brimonidine-timolol (COMBIGAN) 0.2-0.5 % ophthalmic solution Place 1 drop into both eyes every 12 (twelve) hours.     [provider]  calcium acetate (PHOSLO) 667 MG capsule Take 1 capsule (667 mg total) by mouth as needed (with snacks). Patient taking differently: Take 667-2,001 mg by mouth See admin instructions. Take 3 capsules (20105m) with a meal three times a day. Take 667 mg with snack 11/11/17   RDebbe Odea MD  carvedilol (COREG) 25 MG tablet Take 25 mg by mouth 2 (two) times daily. 03/28/17   [provider]  hydrALAZINE (APRESOLINE) 25 MG tablet Take 1 tablet (25 mg total) by mouth every 8 (eight) hours as needed (SBP > 170  or DBP > 110). Patient taking differently: Take 50 mg by mouth 3 (three) times daily.  02/20/17   Mikhail, [name] DO  insulin detemir (LEVEMIR) 100 UNIT/ML injection Inject 0.15 mLs (15  Units total) into the skin at bedtime. Patient taking differently: Inject 30 Units into the skin at bedtime.  02/20/17   Mikhail, [name] DO  latanoprost (XALATAN) 0.005 % ophthalmic solution Place 1 drop into both eyes every evening.    [provider]  Multiple Vitamins-Minerals (RENAPLEX) TABS Take 1 tablet by mouth daily. 07/10/18   [provider]  pantoprazole (PROTONIX) 40 MG tablet Take 1 tablet (40 mg total) by mouth 2 (two) times daily before a meal. Take 1 tablet twice daily for a  Month then once dailly 09/04/18   GCaren Griffins MD  polyethylene glycol (MIRALAX / GLYCOLAX) packet Take 17 g by mouth daily. Patient taking differently: Take 17 g by mouth daily as needed for mild constipation.  02/20/17   Mikhail, [name] DO  timolol (TIMOPTIC) 0.5 % ophthalmic solution Place 1 drop into both eyes daily.    [provider]    Allergies    Baclofen and Chlorhexidine  Review of Systems   Review of Systems  Respiratory: Positive for cough.   Gastrointestinal: Positive for diarrhea.  All other systems reviewed and are negative.   Physical Exam Updated Vital Signs BP (!) 188/101 (BP Location: Right Arm)   Pulse 88   Temp 98.7 F (37.1 C) (Oral)   Resp 16   Ht 5' 3.5" (1.613 m)   Wt 81.6 kg   LMP  (LMP Unknown)   SpO2 96%   BMI 31.39 kg/m   Physical Exam Vitals and nursing note reviewed.   73year old female, resting comfortably and in no acute distress. Vital signs are significant for elevated blood pressure. Oxygen saturation is 96%, which is normal. Head is normocephalic and atraumatic. PERRLA, EOMI. Oropharynx is clear. Neck is nontender and supple without adenopathy or JVD. Back is nontender and there is no CVA tenderness. Lungs are clear without rales, wheezes, or rhonchi. Chest is nontender. Heart has regular rate and rhythm with 2/6 systolic ejection murmur best heard along the left sternal border. Abdomen is soft, flat, nontender  without masses or hepatosplenomegaly and peristalsis is normoactive. Extremities have no cyanosis or edema, full range of motion is present.  AV fistula present in the left arm with thrill present. Skin is warm and dry  without rash. Neurologic: Mental status is normal, cranial nerves are intact, there are no motor or sensory deficits.  ED Results / Procedures / Treatments   Labs (all labs ordered are listed, but only abnormal results are displayed) Labs Reviewed  COMPREHENSIVE METABOLIC PANEL - Abnormal; Notable for the following components:      Result Value   Chloride 95 (*)    Glucose, Bld 142 (*)    BUN 25 (*)    Creatinine, Ser 5.46 (*)    Albumin 3.4 (*)    GFR calc non Af Amer 7 (*)    GFR calc Af Amer 8 (*)    All other components within normal limits  CBC WITH DIFFERENTIAL/PLATELET - Abnormal; Notable for the following components:   WBC 3.2 (*)    RDW 15.8 (*)    Neutro Abs 1.4 (*)    All other components within normal limits  URINALYSIS, ROUTINE W REFLEX MICROSCOPIC   Radiology DG Chest Portable 1 View  Result Date: 07/28/2019 CLINICAL DATA:  Cough x2 days, covid exposure EXAM: PORTABLE CHEST 1 VIEW COMPARISON:  January 30, 2019 FINDINGS: The heart size and mediastinal contours are unchanged with mild cardiomegaly. Both lungs are clear. No acute osseous abnormality. IMPRESSION: No active disease.  Mild cardiomegaly. Electronically Signed   By: Prudencio Pair M.D.   On: 07/28/2019 22:05    Procedures Procedures  Medications Ordered in ED Medications - No data to display  ED Course  I have reviewed the triage vital signs and the nursing notes.  Pertinent labs & imaging results that were available during my care of the patient were reviewed by me and considered in my medical decision making (see chart for details).    MDM Rules/Calculators/A&P                     Exposure to COVID-19 with symptoms compatible with mild COVID-19 disease.  However, with no dyspnea and  normal oxygen saturation, no indication for hospitalization.  Will obtain nasopharyngeal swab to test for COVID-19.  Currently, no indication for any medical intervention.  Patient advised to return if she develops dyspnea or confusion.  He is encouraged to take her blood pressure medication as prescribed and to go for her dialysis as scheduled.  Old records are reviewed, and she has had hospitalizations for pulmonary edema.  Today, chest x-ray shows cardiomegaly without evidence of pulmonary edema.  Labs show known renal disease, mild leukopenia which has been present previously and may not be related to COVID-19 infection.  Final Clinical Impression(s) / ED Diagnoses Final diagnoses:  Cough with exposure to COVID-19 virus  End-stage renal disease on hemodialysis ([place])  Other decreased white blood cell (WBC) count  Elevated blood pressure reading with diagnosis of hypertension    Rx / DC Orders ED Discharge Orders    None       Delora Fuel, MD 02[birth year] [phone]

## 2019-07-29 NOTE — Discharge Instructions (Addendum)
Return to the Emergency Department if you develop confusion or shortness of breath.  You can check for the results of the COVID test on MyChart.  Make sure to take your blood pressure medications, and to go for your dialysis tomorrow (Tuesday).

## 2019-07-30 ENCOUNTER — Telehealth: Payer: Self-pay | Admitting: Unknown Physician Specialty

## 2019-07-30 NOTE — Telephone Encounter (Signed)
Called to discuss with patient about Covid symptoms and the use of bamlanivimab, a monoclonal antibody infusion for those with mild to moderate Covid symptoms and at a high risk of hospitalization.  Pt is qualified for this infusion at the [facility] infusion center due to Age > 33 with comorbid conditions   Message left to call back

## 2019-08-09 DIAGNOSIS — Z992 Dependence on renal dialysis: Secondary | ICD-10-CM | POA: Diagnosis not present

## 2019-08-09 DIAGNOSIS — N186 End stage renal disease: Secondary | ICD-10-CM | POA: Diagnosis not present

## 2019-08-09 DIAGNOSIS — E1122 Type 2 diabetes mellitus with diabetic chronic kidney disease: Secondary | ICD-10-CM | POA: Diagnosis not present

## 2019-08-11 DIAGNOSIS — D631 Anemia in chronic kidney disease: Secondary | ICD-10-CM | POA: Diagnosis not present

## 2019-08-11 DIAGNOSIS — E1129 Type 2 diabetes mellitus with other diabetic kidney complication: Secondary | ICD-10-CM | POA: Diagnosis not present

## 2019-08-11 DIAGNOSIS — U071 COVID-19: Secondary | ICD-10-CM | POA: Diagnosis not present

## 2019-08-11 DIAGNOSIS — N186 End stage renal disease: Secondary | ICD-10-CM | POA: Diagnosis not present

## 2019-08-11 DIAGNOSIS — Z992 Dependence on renal dialysis: Secondary | ICD-10-CM | POA: Diagnosis not present

## 2019-08-11 DIAGNOSIS — N2581 Secondary hyperparathyroidism of renal origin: Secondary | ICD-10-CM | POA: Diagnosis not present

## 2019-08-11 DIAGNOSIS — D509 Iron deficiency anemia, unspecified: Secondary | ICD-10-CM | POA: Diagnosis not present

## 2019-08-12 DIAGNOSIS — E785 Hyperlipidemia, unspecified: Secondary | ICD-10-CM | POA: Diagnosis not present

## 2019-08-12 DIAGNOSIS — Z992 Dependence on renal dialysis: Secondary | ICD-10-CM | POA: Diagnosis not present

## 2019-08-12 DIAGNOSIS — I10 Essential (primary) hypertension: Secondary | ICD-10-CM | POA: Diagnosis not present

## 2019-08-12 DIAGNOSIS — E119 Type 2 diabetes mellitus without complications: Secondary | ICD-10-CM | POA: Diagnosis not present

## 2019-08-12 DIAGNOSIS — Z720 Tobacco use: Secondary | ICD-10-CM | POA: Diagnosis not present

## 2019-08-12 DIAGNOSIS — N186 End stage renal disease: Secondary | ICD-10-CM | POA: Diagnosis not present

## 2019-08-13 DIAGNOSIS — U071 COVID-19: Secondary | ICD-10-CM | POA: Diagnosis not present

## 2019-08-13 DIAGNOSIS — E1129 Type 2 diabetes mellitus with other diabetic kidney complication: Secondary | ICD-10-CM | POA: Diagnosis not present

## 2019-08-13 DIAGNOSIS — N186 End stage renal disease: Secondary | ICD-10-CM | POA: Diagnosis not present

## 2019-08-13 DIAGNOSIS — N2581 Secondary hyperparathyroidism of renal origin: Secondary | ICD-10-CM | POA: Diagnosis not present

## 2019-08-13 DIAGNOSIS — Z992 Dependence on renal dialysis: Secondary | ICD-10-CM | POA: Diagnosis not present

## 2019-08-13 DIAGNOSIS — D631 Anemia in chronic kidney disease: Secondary | ICD-10-CM | POA: Diagnosis not present

## 2019-08-13 DIAGNOSIS — D509 Iron deficiency anemia, unspecified: Secondary | ICD-10-CM | POA: Diagnosis not present

## 2019-08-15 DIAGNOSIS — N2581 Secondary hyperparathyroidism of renal origin: Secondary | ICD-10-CM | POA: Diagnosis not present

## 2019-08-15 DIAGNOSIS — E1129 Type 2 diabetes mellitus with other diabetic kidney complication: Secondary | ICD-10-CM | POA: Diagnosis not present

## 2019-08-15 DIAGNOSIS — N186 End stage renal disease: Secondary | ICD-10-CM | POA: Diagnosis not present

## 2019-08-15 DIAGNOSIS — D631 Anemia in chronic kidney disease: Secondary | ICD-10-CM | POA: Diagnosis not present

## 2019-08-15 DIAGNOSIS — D509 Iron deficiency anemia, unspecified: Secondary | ICD-10-CM | POA: Diagnosis not present

## 2019-08-15 DIAGNOSIS — Z992 Dependence on renal dialysis: Secondary | ICD-10-CM | POA: Diagnosis not present

## 2019-08-17 DIAGNOSIS — D509 Iron deficiency anemia, unspecified: Secondary | ICD-10-CM | POA: Diagnosis not present

## 2019-08-17 DIAGNOSIS — N186 End stage renal disease: Secondary | ICD-10-CM | POA: Diagnosis not present

## 2019-08-17 DIAGNOSIS — D631 Anemia in chronic kidney disease: Secondary | ICD-10-CM | POA: Diagnosis not present

## 2019-08-17 DIAGNOSIS — Z992 Dependence on renal dialysis: Secondary | ICD-10-CM | POA: Diagnosis not present

## 2019-08-17 DIAGNOSIS — N2581 Secondary hyperparathyroidism of renal origin: Secondary | ICD-10-CM | POA: Diagnosis not present

## 2019-08-17 DIAGNOSIS — E1129 Type 2 diabetes mellitus with other diabetic kidney complication: Secondary | ICD-10-CM | POA: Diagnosis not present

## 2019-08-20 DIAGNOSIS — N2581 Secondary hyperparathyroidism of renal origin: Secondary | ICD-10-CM | POA: Diagnosis not present

## 2019-08-20 DIAGNOSIS — E1129 Type 2 diabetes mellitus with other diabetic kidney complication: Secondary | ICD-10-CM | POA: Diagnosis not present

## 2019-08-20 DIAGNOSIS — Z992 Dependence on renal dialysis: Secondary | ICD-10-CM | POA: Diagnosis not present

## 2019-08-20 DIAGNOSIS — D509 Iron deficiency anemia, unspecified: Secondary | ICD-10-CM | POA: Diagnosis not present

## 2019-08-20 DIAGNOSIS — N186 End stage renal disease: Secondary | ICD-10-CM | POA: Diagnosis not present

## 2019-08-20 DIAGNOSIS — D631 Anemia in chronic kidney disease: Secondary | ICD-10-CM | POA: Diagnosis not present

## 2019-08-22 DIAGNOSIS — Z992 Dependence on renal dialysis: Secondary | ICD-10-CM | POA: Diagnosis not present

## 2019-08-22 DIAGNOSIS — D509 Iron deficiency anemia, unspecified: Secondary | ICD-10-CM | POA: Diagnosis not present

## 2019-08-22 DIAGNOSIS — N186 End stage renal disease: Secondary | ICD-10-CM | POA: Diagnosis not present

## 2019-08-22 DIAGNOSIS — E1129 Type 2 diabetes mellitus with other diabetic kidney complication: Secondary | ICD-10-CM | POA: Diagnosis not present

## 2019-08-22 DIAGNOSIS — N2581 Secondary hyperparathyroidism of renal origin: Secondary | ICD-10-CM | POA: Diagnosis not present

## 2019-08-22 DIAGNOSIS — D631 Anemia in chronic kidney disease: Secondary | ICD-10-CM | POA: Diagnosis not present

## 2019-08-24 DIAGNOSIS — N2581 Secondary hyperparathyroidism of renal origin: Secondary | ICD-10-CM | POA: Diagnosis not present

## 2019-08-24 DIAGNOSIS — Z992 Dependence on renal dialysis: Secondary | ICD-10-CM | POA: Diagnosis not present

## 2019-08-24 DIAGNOSIS — E1129 Type 2 diabetes mellitus with other diabetic kidney complication: Secondary | ICD-10-CM | POA: Diagnosis not present

## 2019-08-24 DIAGNOSIS — N186 End stage renal disease: Secondary | ICD-10-CM | POA: Diagnosis not present

## 2019-08-24 DIAGNOSIS — D509 Iron deficiency anemia, unspecified: Secondary | ICD-10-CM | POA: Diagnosis not present

## 2019-08-24 DIAGNOSIS — D631 Anemia in chronic kidney disease: Secondary | ICD-10-CM | POA: Diagnosis not present

## 2019-08-27 DIAGNOSIS — D631 Anemia in chronic kidney disease: Secondary | ICD-10-CM | POA: Diagnosis not present

## 2019-08-27 DIAGNOSIS — E1129 Type 2 diabetes mellitus with other diabetic kidney complication: Secondary | ICD-10-CM | POA: Diagnosis not present

## 2019-08-27 DIAGNOSIS — D509 Iron deficiency anemia, unspecified: Secondary | ICD-10-CM | POA: Diagnosis not present

## 2019-08-27 DIAGNOSIS — N2581 Secondary hyperparathyroidism of renal origin: Secondary | ICD-10-CM | POA: Diagnosis not present

## 2019-08-27 DIAGNOSIS — N186 End stage renal disease: Secondary | ICD-10-CM | POA: Diagnosis not present

## 2019-08-27 DIAGNOSIS — Z992 Dependence on renal dialysis: Secondary | ICD-10-CM | POA: Diagnosis not present

## 2019-08-29 DIAGNOSIS — D631 Anemia in chronic kidney disease: Secondary | ICD-10-CM | POA: Diagnosis not present

## 2019-08-29 DIAGNOSIS — E1129 Type 2 diabetes mellitus with other diabetic kidney complication: Secondary | ICD-10-CM | POA: Diagnosis not present

## 2019-08-29 DIAGNOSIS — D509 Iron deficiency anemia, unspecified: Secondary | ICD-10-CM | POA: Diagnosis not present

## 2019-08-29 DIAGNOSIS — Z992 Dependence on renal dialysis: Secondary | ICD-10-CM | POA: Diagnosis not present

## 2019-08-29 DIAGNOSIS — N2581 Secondary hyperparathyroidism of renal origin: Secondary | ICD-10-CM | POA: Diagnosis not present

## 2019-08-29 DIAGNOSIS — N186 End stage renal disease: Secondary | ICD-10-CM | POA: Diagnosis not present

## 2019-08-29 IMAGING — CT CT HEAD W/O CM
3 series · 14 of 47 positions shown, 16 images · non-contrast
Comparison: MRI of the brain 06/13/2014, head CT 06/10/2014

CLINICAL DATA: Extreme hypertension.  Confusion.

EXAM:
CT HEAD WITHOUT CONTRAST
TECHNIQUE: Contiguous axial images were obtained from the base of the skull
through the vertex without intravenous contrast.

[Series 3: head 5.0 h30s · axial · 0.45mm/px · z∈[-187,-47]mm · 8 of 34 slices shown, 10 images]
[im 3/34  brain]
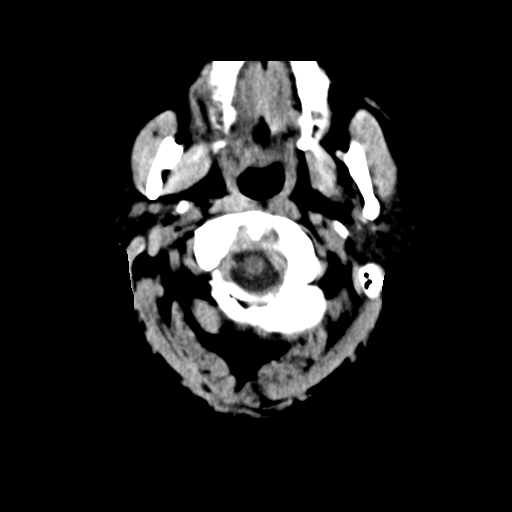
[im 3/34  bone]
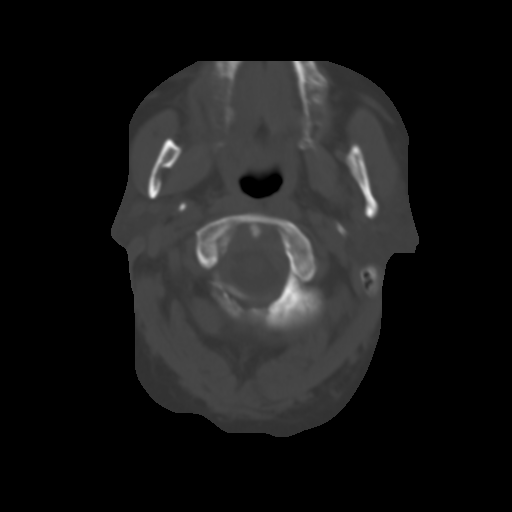
[im 7/34  brain]
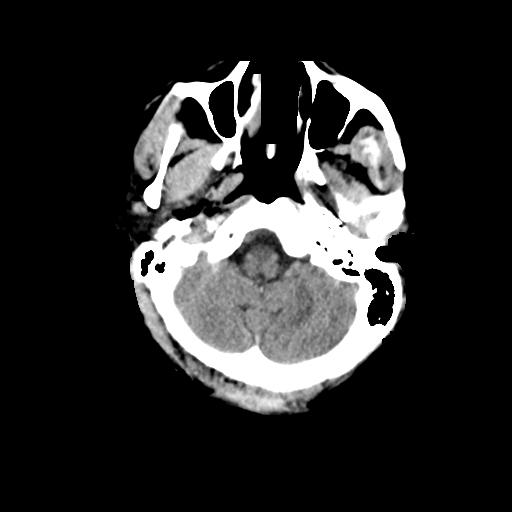
[im 11/34  brain]
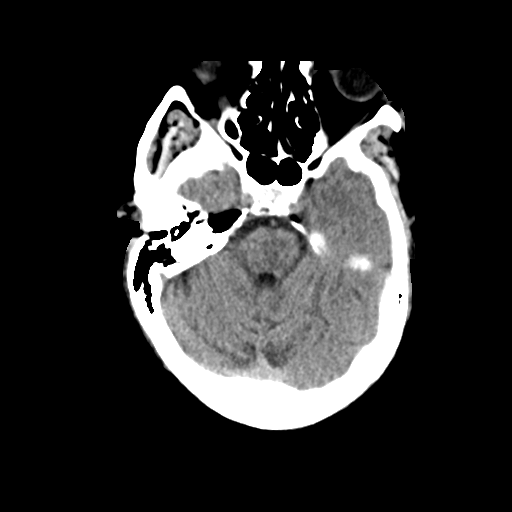
[im 15/34  brain]
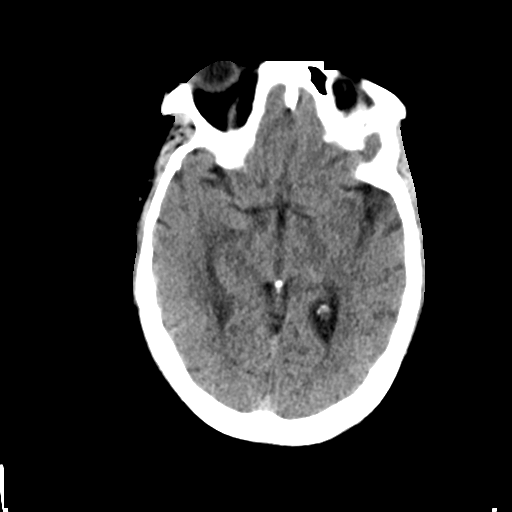
[im 19/34  brain]
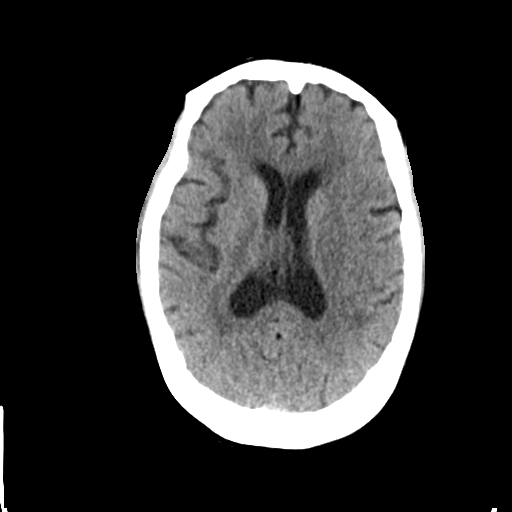
[im 19/34  bone]
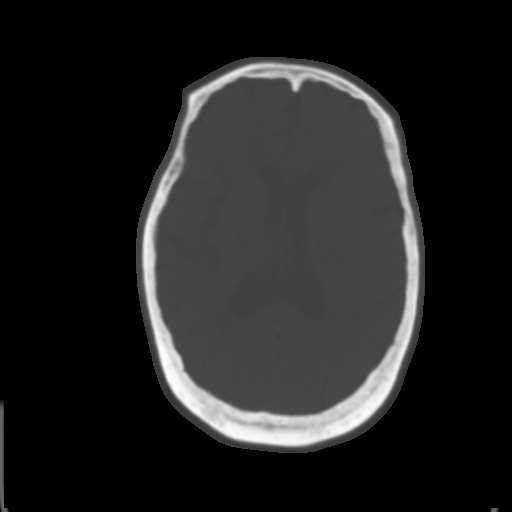
[im 23/34  brain]
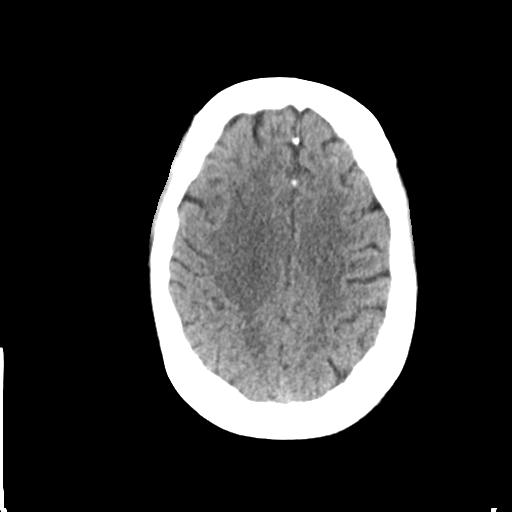
[im 27/34  brain]
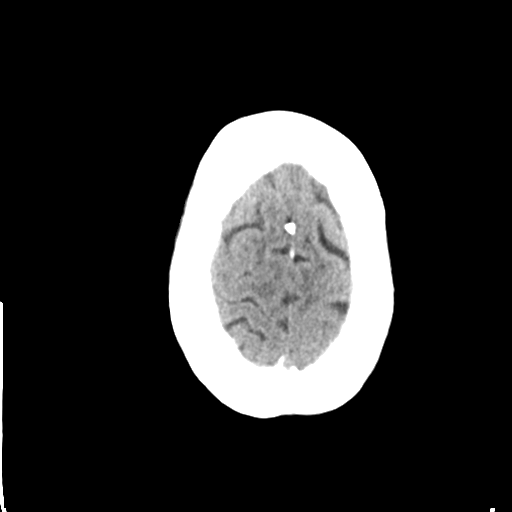
[im 31/34  brain]
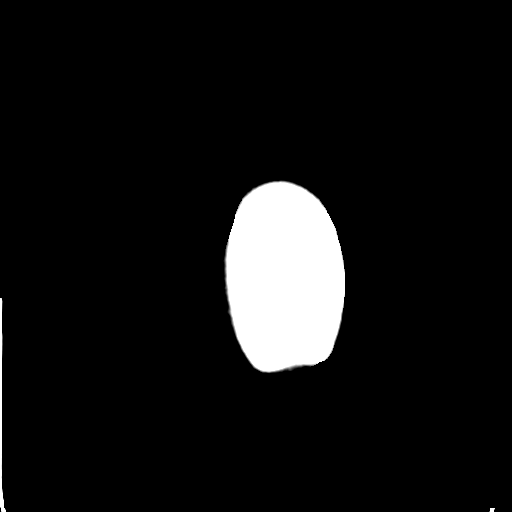

[Series 5: head 3.0 mpr cor · coronal · 0.33mm/px · 3 of 76 slices shown]
[im 26/76  brain]
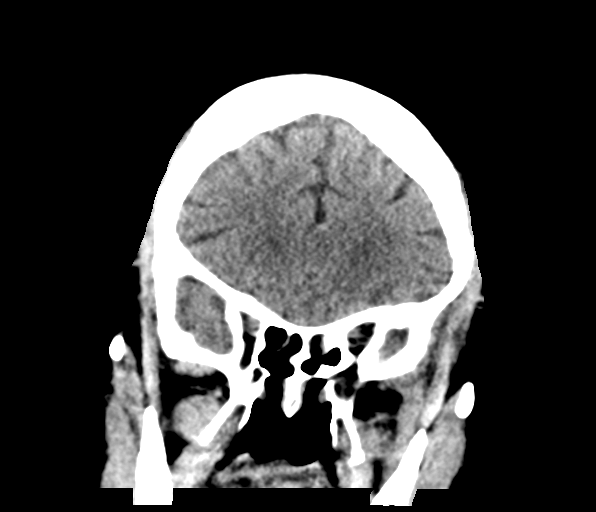
[im 34/76  brain]
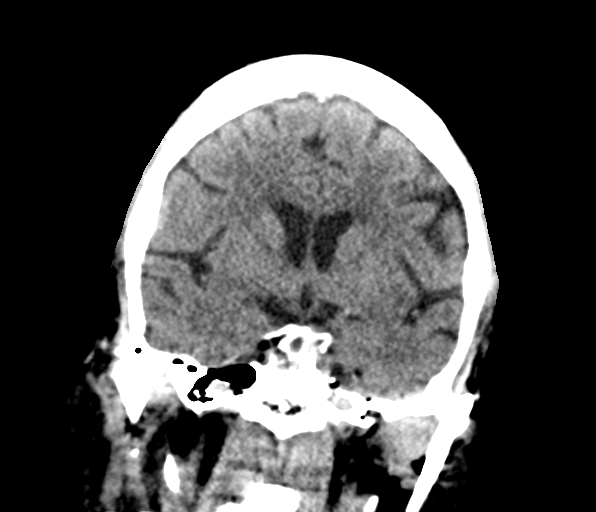
[im 42/76  brain]
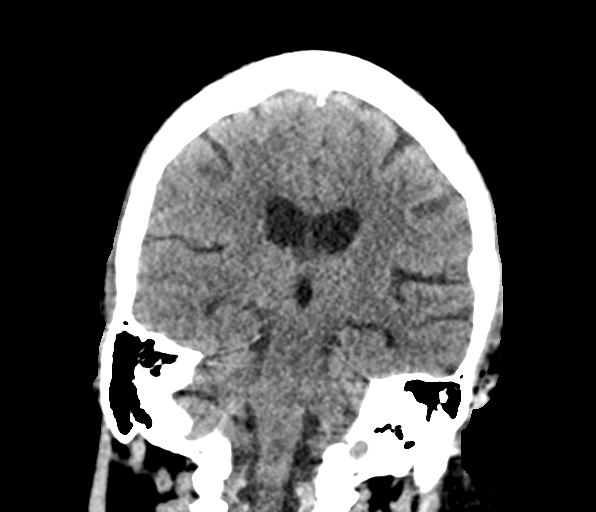

[Series 6: head 3.0 mpr sag · sagittal · 0.33mm/px · 3 of 67 slices shown]
[im 23/67  brain]
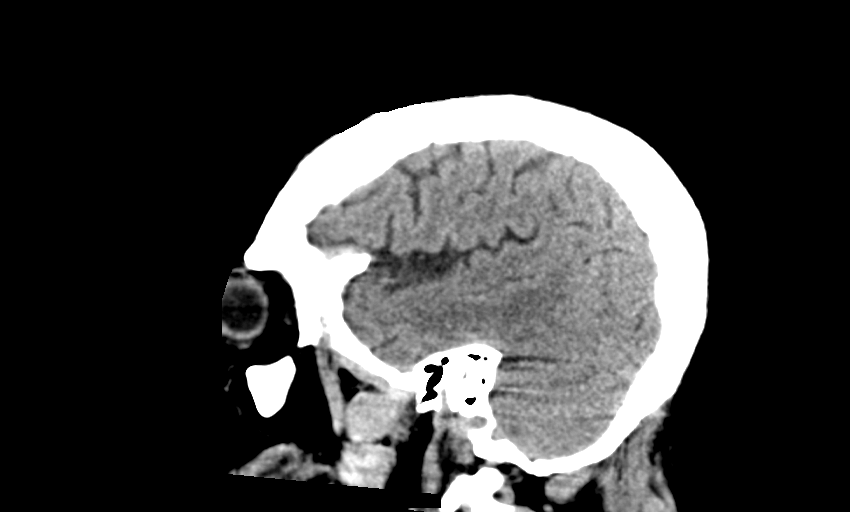
[im 34/67  brain]
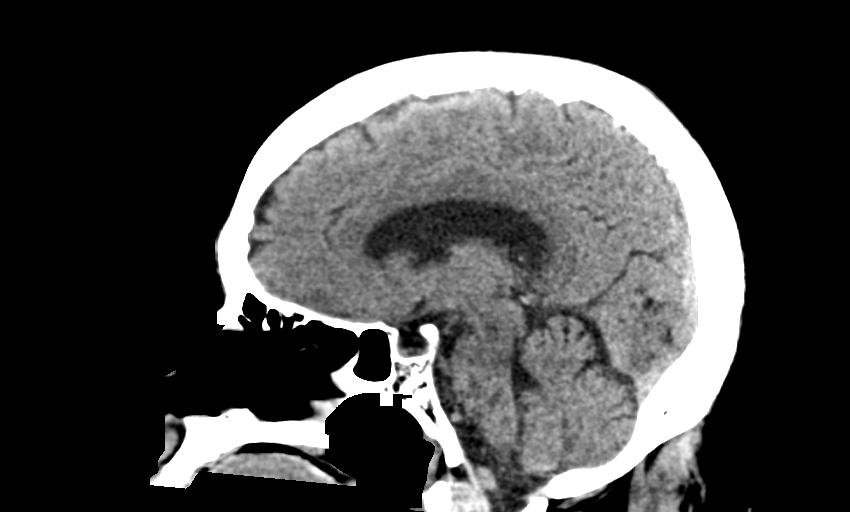
[im 45/67  brain]
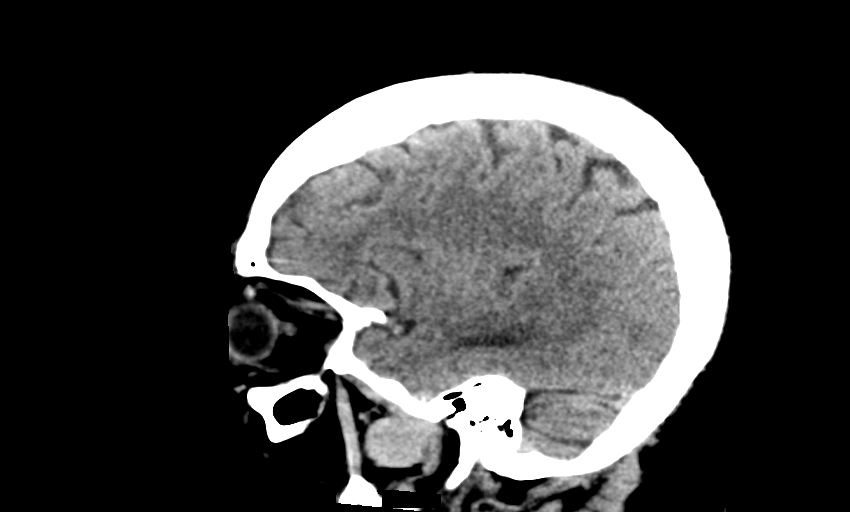

[14 of 47 positions shown; findings below may reference images not displayed]

FINDINGS: Brain: No evidence of acute infarction, hemorrhage, hydrocephalus,
extra-axial collection or mass lesion/mass effect. Moderate brain
parenchymal volume loss and periventricular microangiopathy.

Vascular: Calcific atherosclerotic disease at the skullbase.

Skull: Normal. Negative for fracture or focal lesion.

Sinuses/Orbits: No acute finding.

Other: None.
IMPRESSION: No acute intracranial abnormality.

Atrophy, chronic microvascular disease.

## 2019-08-31 DIAGNOSIS — E1129 Type 2 diabetes mellitus with other diabetic kidney complication: Secondary | ICD-10-CM | POA: Diagnosis not present

## 2019-08-31 DIAGNOSIS — N186 End stage renal disease: Secondary | ICD-10-CM | POA: Diagnosis not present

## 2019-08-31 DIAGNOSIS — N2581 Secondary hyperparathyroidism of renal origin: Secondary | ICD-10-CM | POA: Diagnosis not present

## 2019-08-31 DIAGNOSIS — Z992 Dependence on renal dialysis: Secondary | ICD-10-CM | POA: Diagnosis not present

## 2019-08-31 DIAGNOSIS — D509 Iron deficiency anemia, unspecified: Secondary | ICD-10-CM | POA: Diagnosis not present

## 2019-08-31 DIAGNOSIS — D631 Anemia in chronic kidney disease: Secondary | ICD-10-CM | POA: Diagnosis not present

## 2019-09-03 DIAGNOSIS — N2581 Secondary hyperparathyroidism of renal origin: Secondary | ICD-10-CM | POA: Diagnosis not present

## 2019-09-03 DIAGNOSIS — D631 Anemia in chronic kidney disease: Secondary | ICD-10-CM | POA: Diagnosis not present

## 2019-09-03 DIAGNOSIS — D509 Iron deficiency anemia, unspecified: Secondary | ICD-10-CM | POA: Diagnosis not present

## 2019-09-03 DIAGNOSIS — Z992 Dependence on renal dialysis: Secondary | ICD-10-CM | POA: Diagnosis not present

## 2019-09-03 DIAGNOSIS — E1129 Type 2 diabetes mellitus with other diabetic kidney complication: Secondary | ICD-10-CM | POA: Diagnosis not present

## 2019-09-03 DIAGNOSIS — N186 End stage renal disease: Secondary | ICD-10-CM | POA: Diagnosis not present

## 2019-09-05 DIAGNOSIS — E1129 Type 2 diabetes mellitus with other diabetic kidney complication: Secondary | ICD-10-CM | POA: Diagnosis not present

## 2019-09-05 DIAGNOSIS — D631 Anemia in chronic kidney disease: Secondary | ICD-10-CM | POA: Diagnosis not present

## 2019-09-05 DIAGNOSIS — N2581 Secondary hyperparathyroidism of renal origin: Secondary | ICD-10-CM | POA: Diagnosis not present

## 2019-09-05 DIAGNOSIS — Z992 Dependence on renal dialysis: Secondary | ICD-10-CM | POA: Diagnosis not present

## 2019-09-05 DIAGNOSIS — N186 End stage renal disease: Secondary | ICD-10-CM | POA: Diagnosis not present

## 2019-09-05 DIAGNOSIS — D509 Iron deficiency anemia, unspecified: Secondary | ICD-10-CM | POA: Diagnosis not present

## 2019-09-07 DIAGNOSIS — N2581 Secondary hyperparathyroidism of renal origin: Secondary | ICD-10-CM | POA: Diagnosis not present

## 2019-09-07 DIAGNOSIS — D631 Anemia in chronic kidney disease: Secondary | ICD-10-CM | POA: Diagnosis not present

## 2019-09-07 DIAGNOSIS — Z992 Dependence on renal dialysis: Secondary | ICD-10-CM | POA: Diagnosis not present

## 2019-09-07 DIAGNOSIS — D509 Iron deficiency anemia, unspecified: Secondary | ICD-10-CM | POA: Diagnosis not present

## 2019-09-07 DIAGNOSIS — N186 End stage renal disease: Secondary | ICD-10-CM | POA: Diagnosis not present

## 2019-09-07 DIAGNOSIS — E1129 Type 2 diabetes mellitus with other diabetic kidney complication: Secondary | ICD-10-CM | POA: Diagnosis not present

## 2019-09-09 DIAGNOSIS — Z992 Dependence on renal dialysis: Secondary | ICD-10-CM | POA: Diagnosis not present

## 2019-09-09 DIAGNOSIS — E1122 Type 2 diabetes mellitus with diabetic chronic kidney disease: Secondary | ICD-10-CM | POA: Diagnosis not present

## 2019-09-09 DIAGNOSIS — N186 End stage renal disease: Secondary | ICD-10-CM | POA: Diagnosis not present

## 2019-09-10 DIAGNOSIS — D631 Anemia in chronic kidney disease: Secondary | ICD-10-CM | POA: Diagnosis not present

## 2019-09-10 DIAGNOSIS — N186 End stage renal disease: Secondary | ICD-10-CM | POA: Diagnosis not present

## 2019-09-10 DIAGNOSIS — Z992 Dependence on renal dialysis: Secondary | ICD-10-CM | POA: Diagnosis not present

## 2019-09-10 DIAGNOSIS — N2581 Secondary hyperparathyroidism of renal origin: Secondary | ICD-10-CM | POA: Diagnosis not present

## 2019-09-12 DIAGNOSIS — N2581 Secondary hyperparathyroidism of renal origin: Secondary | ICD-10-CM | POA: Diagnosis not present

## 2019-09-12 DIAGNOSIS — N186 End stage renal disease: Secondary | ICD-10-CM | POA: Diagnosis not present

## 2019-09-12 DIAGNOSIS — Z992 Dependence on renal dialysis: Secondary | ICD-10-CM | POA: Diagnosis not present

## 2019-09-12 DIAGNOSIS — D631 Anemia in chronic kidney disease: Secondary | ICD-10-CM | POA: Diagnosis not present

## 2019-09-14 DIAGNOSIS — N2581 Secondary hyperparathyroidism of renal origin: Secondary | ICD-10-CM | POA: Diagnosis not present

## 2019-09-14 DIAGNOSIS — Z992 Dependence on renal dialysis: Secondary | ICD-10-CM | POA: Diagnosis not present

## 2019-09-14 DIAGNOSIS — D631 Anemia in chronic kidney disease: Secondary | ICD-10-CM | POA: Diagnosis not present

## 2019-09-14 DIAGNOSIS — N186 End stage renal disease: Secondary | ICD-10-CM | POA: Diagnosis not present

## 2019-09-17 DIAGNOSIS — N186 End stage renal disease: Secondary | ICD-10-CM | POA: Diagnosis not present

## 2019-09-17 DIAGNOSIS — Z992 Dependence on renal dialysis: Secondary | ICD-10-CM | POA: Diagnosis not present

## 2019-09-17 DIAGNOSIS — D631 Anemia in chronic kidney disease: Secondary | ICD-10-CM | POA: Diagnosis not present

## 2019-09-17 DIAGNOSIS — N2581 Secondary hyperparathyroidism of renal origin: Secondary | ICD-10-CM | POA: Diagnosis not present

## 2019-09-19 DIAGNOSIS — N186 End stage renal disease: Secondary | ICD-10-CM | POA: Diagnosis not present

## 2019-09-19 DIAGNOSIS — D631 Anemia in chronic kidney disease: Secondary | ICD-10-CM | POA: Diagnosis not present

## 2019-09-19 DIAGNOSIS — Z992 Dependence on renal dialysis: Secondary | ICD-10-CM | POA: Diagnosis not present

## 2019-09-19 DIAGNOSIS — N2581 Secondary hyperparathyroidism of renal origin: Secondary | ICD-10-CM | POA: Diagnosis not present

## 2019-09-21 DIAGNOSIS — D631 Anemia in chronic kidney disease: Secondary | ICD-10-CM | POA: Diagnosis not present

## 2019-09-21 DIAGNOSIS — Z992 Dependence on renal dialysis: Secondary | ICD-10-CM | POA: Diagnosis not present

## 2019-09-21 DIAGNOSIS — N186 End stage renal disease: Secondary | ICD-10-CM | POA: Diagnosis not present

## 2019-09-21 DIAGNOSIS — N2581 Secondary hyperparathyroidism of renal origin: Secondary | ICD-10-CM | POA: Diagnosis not present

## 2019-09-24 DIAGNOSIS — D631 Anemia in chronic kidney disease: Secondary | ICD-10-CM | POA: Diagnosis not present

## 2019-09-24 DIAGNOSIS — N2581 Secondary hyperparathyroidism of renal origin: Secondary | ICD-10-CM | POA: Diagnosis not present

## 2019-09-24 DIAGNOSIS — N186 End stage renal disease: Secondary | ICD-10-CM | POA: Diagnosis not present

## 2019-09-24 DIAGNOSIS — Z992 Dependence on renal dialysis: Secondary | ICD-10-CM | POA: Diagnosis not present

## 2019-09-26 DIAGNOSIS — Z992 Dependence on renal dialysis: Secondary | ICD-10-CM | POA: Diagnosis not present

## 2019-09-26 DIAGNOSIS — N2581 Secondary hyperparathyroidism of renal origin: Secondary | ICD-10-CM | POA: Diagnosis not present

## 2019-09-26 DIAGNOSIS — D631 Anemia in chronic kidney disease: Secondary | ICD-10-CM | POA: Diagnosis not present

## 2019-09-26 DIAGNOSIS — N186 End stage renal disease: Secondary | ICD-10-CM | POA: Diagnosis not present

## 2019-09-27 DIAGNOSIS — T82858A Stenosis of vascular prosthetic devices, implants and grafts, initial encounter: Secondary | ICD-10-CM | POA: Diagnosis not present

## 2019-09-27 DIAGNOSIS — Z992 Dependence on renal dialysis: Secondary | ICD-10-CM | POA: Diagnosis not present

## 2019-09-27 DIAGNOSIS — N186 End stage renal disease: Secondary | ICD-10-CM | POA: Diagnosis not present

## 2019-09-27 DIAGNOSIS — I871 Compression of vein: Secondary | ICD-10-CM | POA: Diagnosis not present

## 2019-09-28 DIAGNOSIS — N186 End stage renal disease: Secondary | ICD-10-CM | POA: Diagnosis not present

## 2019-09-28 DIAGNOSIS — N2581 Secondary hyperparathyroidism of renal origin: Secondary | ICD-10-CM | POA: Diagnosis not present

## 2019-09-28 DIAGNOSIS — Z992 Dependence on renal dialysis: Secondary | ICD-10-CM | POA: Diagnosis not present

## 2019-09-28 DIAGNOSIS — D631 Anemia in chronic kidney disease: Secondary | ICD-10-CM | POA: Diagnosis not present

## 2019-10-01 DIAGNOSIS — N186 End stage renal disease: Secondary | ICD-10-CM | POA: Diagnosis not present

## 2019-10-01 DIAGNOSIS — N2581 Secondary hyperparathyroidism of renal origin: Secondary | ICD-10-CM | POA: Diagnosis not present

## 2019-10-01 DIAGNOSIS — D631 Anemia in chronic kidney disease: Secondary | ICD-10-CM | POA: Diagnosis not present

## 2019-10-01 DIAGNOSIS — Z992 Dependence on renal dialysis: Secondary | ICD-10-CM | POA: Diagnosis not present

## 2019-10-03 DIAGNOSIS — N2581 Secondary hyperparathyroidism of renal origin: Secondary | ICD-10-CM | POA: Diagnosis not present

## 2019-10-03 DIAGNOSIS — Z992 Dependence on renal dialysis: Secondary | ICD-10-CM | POA: Diagnosis not present

## 2019-10-03 DIAGNOSIS — D631 Anemia in chronic kidney disease: Secondary | ICD-10-CM | POA: Diagnosis not present

## 2019-10-03 DIAGNOSIS — N186 End stage renal disease: Secondary | ICD-10-CM | POA: Diagnosis not present

## 2019-10-05 DIAGNOSIS — D631 Anemia in chronic kidney disease: Secondary | ICD-10-CM | POA: Diagnosis not present

## 2019-10-05 DIAGNOSIS — Z992 Dependence on renal dialysis: Secondary | ICD-10-CM | POA: Diagnosis not present

## 2019-10-05 DIAGNOSIS — N186 End stage renal disease: Secondary | ICD-10-CM | POA: Diagnosis not present

## 2019-10-05 DIAGNOSIS — N2581 Secondary hyperparathyroidism of renal origin: Secondary | ICD-10-CM | POA: Diagnosis not present

## 2019-10-07 DIAGNOSIS — Z992 Dependence on renal dialysis: Secondary | ICD-10-CM | POA: Diagnosis not present

## 2019-10-07 DIAGNOSIS — N186 End stage renal disease: Secondary | ICD-10-CM | POA: Diagnosis not present

## 2019-10-07 DIAGNOSIS — E1122 Type 2 diabetes mellitus with diabetic chronic kidney disease: Secondary | ICD-10-CM | POA: Diagnosis not present

## 2019-10-08 DIAGNOSIS — Z992 Dependence on renal dialysis: Secondary | ICD-10-CM | POA: Diagnosis not present

## 2019-10-08 DIAGNOSIS — N186 End stage renal disease: Secondary | ICD-10-CM | POA: Diagnosis not present

## 2019-10-08 DIAGNOSIS — N2581 Secondary hyperparathyroidism of renal origin: Secondary | ICD-10-CM | POA: Diagnosis not present

## 2019-10-10 DIAGNOSIS — N186 End stage renal disease: Secondary | ICD-10-CM | POA: Diagnosis not present

## 2019-10-10 DIAGNOSIS — N2581 Secondary hyperparathyroidism of renal origin: Secondary | ICD-10-CM | POA: Diagnosis not present

## 2019-10-10 DIAGNOSIS — Z992 Dependence on renal dialysis: Secondary | ICD-10-CM | POA: Diagnosis not present

## 2019-10-12 DIAGNOSIS — Z992 Dependence on renal dialysis: Secondary | ICD-10-CM | POA: Diagnosis not present

## 2019-10-12 DIAGNOSIS — N2581 Secondary hyperparathyroidism of renal origin: Secondary | ICD-10-CM | POA: Diagnosis not present

## 2019-10-12 DIAGNOSIS — N186 End stage renal disease: Secondary | ICD-10-CM | POA: Diagnosis not present

## 2019-10-15 DIAGNOSIS — N2581 Secondary hyperparathyroidism of renal origin: Secondary | ICD-10-CM | POA: Diagnosis not present

## 2019-10-15 DIAGNOSIS — N186 End stage renal disease: Secondary | ICD-10-CM | POA: Diagnosis not present

## 2019-10-15 DIAGNOSIS — Z992 Dependence on renal dialysis: Secondary | ICD-10-CM | POA: Diagnosis not present

## 2019-10-17 DIAGNOSIS — N2581 Secondary hyperparathyroidism of renal origin: Secondary | ICD-10-CM | POA: Diagnosis not present

## 2019-10-17 DIAGNOSIS — Z992 Dependence on renal dialysis: Secondary | ICD-10-CM | POA: Diagnosis not present

## 2019-10-17 DIAGNOSIS — N186 End stage renal disease: Secondary | ICD-10-CM | POA: Diagnosis not present

## 2019-10-19 DIAGNOSIS — N186 End stage renal disease: Secondary | ICD-10-CM | POA: Diagnosis not present

## 2019-10-19 DIAGNOSIS — Z992 Dependence on renal dialysis: Secondary | ICD-10-CM | POA: Diagnosis not present

## 2019-10-19 DIAGNOSIS — N2581 Secondary hyperparathyroidism of renal origin: Secondary | ICD-10-CM | POA: Diagnosis not present

## 2019-10-22 DIAGNOSIS — N2581 Secondary hyperparathyroidism of renal origin: Secondary | ICD-10-CM | POA: Diagnosis not present

## 2019-10-22 DIAGNOSIS — Z992 Dependence on renal dialysis: Secondary | ICD-10-CM | POA: Diagnosis not present

## 2019-10-22 DIAGNOSIS — N186 End stage renal disease: Secondary | ICD-10-CM | POA: Diagnosis not present

## 2019-10-24 DIAGNOSIS — N186 End stage renal disease: Secondary | ICD-10-CM | POA: Diagnosis not present

## 2019-10-24 DIAGNOSIS — N2581 Secondary hyperparathyroidism of renal origin: Secondary | ICD-10-CM | POA: Diagnosis not present

## 2019-10-24 DIAGNOSIS — Z992 Dependence on renal dialysis: Secondary | ICD-10-CM | POA: Diagnosis not present

## 2019-10-26 DIAGNOSIS — Z992 Dependence on renal dialysis: Secondary | ICD-10-CM | POA: Diagnosis not present

## 2019-10-26 DIAGNOSIS — N186 End stage renal disease: Secondary | ICD-10-CM | POA: Diagnosis not present

## 2019-10-26 DIAGNOSIS — N2581 Secondary hyperparathyroidism of renal origin: Secondary | ICD-10-CM | POA: Diagnosis not present

## 2019-10-31 DIAGNOSIS — N186 End stage renal disease: Secondary | ICD-10-CM | POA: Diagnosis not present

## 2019-10-31 DIAGNOSIS — N2581 Secondary hyperparathyroidism of renal origin: Secondary | ICD-10-CM | POA: Diagnosis not present

## 2019-10-31 DIAGNOSIS — Z992 Dependence on renal dialysis: Secondary | ICD-10-CM | POA: Diagnosis not present

## 2019-11-02 DIAGNOSIS — N2581 Secondary hyperparathyroidism of renal origin: Secondary | ICD-10-CM | POA: Diagnosis not present

## 2019-11-02 DIAGNOSIS — Z992 Dependence on renal dialysis: Secondary | ICD-10-CM | POA: Diagnosis not present

## 2019-11-02 DIAGNOSIS — N186 End stage renal disease: Secondary | ICD-10-CM | POA: Diagnosis not present

## 2019-11-05 DIAGNOSIS — Z992 Dependence on renal dialysis: Secondary | ICD-10-CM | POA: Diagnosis not present

## 2019-11-05 DIAGNOSIS — N186 End stage renal disease: Secondary | ICD-10-CM | POA: Diagnosis not present

## 2019-11-05 DIAGNOSIS — N2581 Secondary hyperparathyroidism of renal origin: Secondary | ICD-10-CM | POA: Diagnosis not present

## 2019-11-07 DIAGNOSIS — N186 End stage renal disease: Secondary | ICD-10-CM | POA: Diagnosis not present

## 2019-11-07 DIAGNOSIS — N2581 Secondary hyperparathyroidism of renal origin: Secondary | ICD-10-CM | POA: Diagnosis not present

## 2019-11-07 DIAGNOSIS — E1122 Type 2 diabetes mellitus with diabetic chronic kidney disease: Secondary | ICD-10-CM | POA: Diagnosis not present

## 2019-11-07 DIAGNOSIS — Z992 Dependence on renal dialysis: Secondary | ICD-10-CM | POA: Diagnosis not present

## 2019-11-09 DIAGNOSIS — N2581 Secondary hyperparathyroidism of renal origin: Secondary | ICD-10-CM | POA: Diagnosis not present

## 2019-11-09 DIAGNOSIS — N186 End stage renal disease: Secondary | ICD-10-CM | POA: Diagnosis not present

## 2019-11-09 DIAGNOSIS — Z992 Dependence on renal dialysis: Secondary | ICD-10-CM | POA: Diagnosis not present

## 2019-11-12 DIAGNOSIS — N2581 Secondary hyperparathyroidism of renal origin: Secondary | ICD-10-CM | POA: Diagnosis not present

## 2019-11-12 DIAGNOSIS — N186 End stage renal disease: Secondary | ICD-10-CM | POA: Diagnosis not present

## 2019-11-12 DIAGNOSIS — Z992 Dependence on renal dialysis: Secondary | ICD-10-CM | POA: Diagnosis not present

## 2019-11-14 DIAGNOSIS — Z992 Dependence on renal dialysis: Secondary | ICD-10-CM | POA: Diagnosis not present

## 2019-11-14 DIAGNOSIS — E1129 Type 2 diabetes mellitus with other diabetic kidney complication: Secondary | ICD-10-CM | POA: Diagnosis not present

## 2019-11-14 DIAGNOSIS — N186 End stage renal disease: Secondary | ICD-10-CM | POA: Diagnosis not present

## 2019-11-14 DIAGNOSIS — N2581 Secondary hyperparathyroidism of renal origin: Secondary | ICD-10-CM | POA: Diagnosis not present

## 2019-11-16 DIAGNOSIS — Z992 Dependence on renal dialysis: Secondary | ICD-10-CM | POA: Diagnosis not present

## 2019-11-16 DIAGNOSIS — N2581 Secondary hyperparathyroidism of renal origin: Secondary | ICD-10-CM | POA: Diagnosis not present

## 2019-11-16 DIAGNOSIS — N186 End stage renal disease: Secondary | ICD-10-CM | POA: Diagnosis not present

## 2019-11-19 DIAGNOSIS — N186 End stage renal disease: Secondary | ICD-10-CM | POA: Diagnosis not present

## 2019-11-19 DIAGNOSIS — N2581 Secondary hyperparathyroidism of renal origin: Secondary | ICD-10-CM | POA: Diagnosis not present

## 2019-11-19 DIAGNOSIS — Z992 Dependence on renal dialysis: Secondary | ICD-10-CM | POA: Diagnosis not present

## 2019-11-21 DIAGNOSIS — N186 End stage renal disease: Secondary | ICD-10-CM | POA: Diagnosis not present

## 2019-11-21 DIAGNOSIS — Z992 Dependence on renal dialysis: Secondary | ICD-10-CM | POA: Diagnosis not present

## 2019-11-21 DIAGNOSIS — N2581 Secondary hyperparathyroidism of renal origin: Secondary | ICD-10-CM | POA: Diagnosis not present

## 2019-11-23 DIAGNOSIS — N2581 Secondary hyperparathyroidism of renal origin: Secondary | ICD-10-CM | POA: Diagnosis not present

## 2019-11-23 DIAGNOSIS — Z992 Dependence on renal dialysis: Secondary | ICD-10-CM | POA: Diagnosis not present

## 2019-11-23 DIAGNOSIS — N186 End stage renal disease: Secondary | ICD-10-CM | POA: Diagnosis not present

## 2019-11-26 DIAGNOSIS — N2581 Secondary hyperparathyroidism of renal origin: Secondary | ICD-10-CM | POA: Diagnosis not present

## 2019-11-26 DIAGNOSIS — Z992 Dependence on renal dialysis: Secondary | ICD-10-CM | POA: Diagnosis not present

## 2019-11-26 DIAGNOSIS — N186 End stage renal disease: Secondary | ICD-10-CM | POA: Diagnosis not present

## 2019-11-28 DIAGNOSIS — Z992 Dependence on renal dialysis: Secondary | ICD-10-CM | POA: Diagnosis not present

## 2019-11-28 DIAGNOSIS — N2581 Secondary hyperparathyroidism of renal origin: Secondary | ICD-10-CM | POA: Diagnosis not present

## 2019-11-28 DIAGNOSIS — N186 End stage renal disease: Secondary | ICD-10-CM | POA: Diagnosis not present

## 2019-11-30 DIAGNOSIS — N2581 Secondary hyperparathyroidism of renal origin: Secondary | ICD-10-CM | POA: Diagnosis not present

## 2019-11-30 DIAGNOSIS — Z992 Dependence on renal dialysis: Secondary | ICD-10-CM | POA: Diagnosis not present

## 2019-11-30 DIAGNOSIS — N186 End stage renal disease: Secondary | ICD-10-CM | POA: Diagnosis not present

## 2019-12-03 DIAGNOSIS — N186 End stage renal disease: Secondary | ICD-10-CM | POA: Diagnosis not present

## 2019-12-03 DIAGNOSIS — N2581 Secondary hyperparathyroidism of renal origin: Secondary | ICD-10-CM | POA: Diagnosis not present

## 2019-12-03 DIAGNOSIS — Z992 Dependence on renal dialysis: Secondary | ICD-10-CM | POA: Diagnosis not present

## 2019-12-05 DIAGNOSIS — Z992 Dependence on renal dialysis: Secondary | ICD-10-CM | POA: Diagnosis not present

## 2019-12-05 DIAGNOSIS — N2581 Secondary hyperparathyroidism of renal origin: Secondary | ICD-10-CM | POA: Diagnosis not present

## 2019-12-05 DIAGNOSIS — N186 End stage renal disease: Secondary | ICD-10-CM | POA: Diagnosis not present

## 2019-12-07 DIAGNOSIS — N186 End stage renal disease: Secondary | ICD-10-CM | POA: Diagnosis not present

## 2019-12-07 DIAGNOSIS — E1122 Type 2 diabetes mellitus with diabetic chronic kidney disease: Secondary | ICD-10-CM | POA: Diagnosis not present

## 2019-12-07 DIAGNOSIS — N2581 Secondary hyperparathyroidism of renal origin: Secondary | ICD-10-CM | POA: Diagnosis not present

## 2019-12-07 DIAGNOSIS — Z992 Dependence on renal dialysis: Secondary | ICD-10-CM | POA: Diagnosis not present

## 2019-12-10 DIAGNOSIS — Z992 Dependence on renal dialysis: Secondary | ICD-10-CM | POA: Diagnosis not present

## 2019-12-10 DIAGNOSIS — N2581 Secondary hyperparathyroidism of renal origin: Secondary | ICD-10-CM | POA: Diagnosis not present

## 2019-12-10 DIAGNOSIS — N186 End stage renal disease: Secondary | ICD-10-CM | POA: Diagnosis not present

## 2019-12-12 DIAGNOSIS — N2581 Secondary hyperparathyroidism of renal origin: Secondary | ICD-10-CM | POA: Diagnosis not present

## 2019-12-12 DIAGNOSIS — N186 End stage renal disease: Secondary | ICD-10-CM | POA: Diagnosis not present

## 2019-12-12 DIAGNOSIS — Z992 Dependence on renal dialysis: Secondary | ICD-10-CM | POA: Diagnosis not present

## 2019-12-14 DIAGNOSIS — N2581 Secondary hyperparathyroidism of renal origin: Secondary | ICD-10-CM | POA: Diagnosis not present

## 2019-12-14 DIAGNOSIS — Z992 Dependence on renal dialysis: Secondary | ICD-10-CM | POA: Diagnosis not present

## 2019-12-14 DIAGNOSIS — N186 End stage renal disease: Secondary | ICD-10-CM | POA: Diagnosis not present

## 2019-12-17 DIAGNOSIS — N186 End stage renal disease: Secondary | ICD-10-CM | POA: Diagnosis not present

## 2019-12-17 DIAGNOSIS — N2581 Secondary hyperparathyroidism of renal origin: Secondary | ICD-10-CM | POA: Diagnosis not present

## 2019-12-17 DIAGNOSIS — Z992 Dependence on renal dialysis: Secondary | ICD-10-CM | POA: Diagnosis not present

## 2019-12-19 DIAGNOSIS — N186 End stage renal disease: Secondary | ICD-10-CM | POA: Diagnosis not present

## 2019-12-19 DIAGNOSIS — N2581 Secondary hyperparathyroidism of renal origin: Secondary | ICD-10-CM | POA: Diagnosis not present

## 2019-12-19 DIAGNOSIS — Z992 Dependence on renal dialysis: Secondary | ICD-10-CM | POA: Diagnosis not present

## 2019-12-21 DIAGNOSIS — N2581 Secondary hyperparathyroidism of renal origin: Secondary | ICD-10-CM | POA: Diagnosis not present

## 2019-12-21 DIAGNOSIS — N186 End stage renal disease: Secondary | ICD-10-CM | POA: Diagnosis not present

## 2019-12-21 DIAGNOSIS — Z992 Dependence on renal dialysis: Secondary | ICD-10-CM | POA: Diagnosis not present

## 2019-12-24 DIAGNOSIS — Z992 Dependence on renal dialysis: Secondary | ICD-10-CM | POA: Diagnosis not present

## 2019-12-24 DIAGNOSIS — N186 End stage renal disease: Secondary | ICD-10-CM | POA: Diagnosis not present

## 2019-12-24 DIAGNOSIS — N2581 Secondary hyperparathyroidism of renal origin: Secondary | ICD-10-CM | POA: Diagnosis not present

## 2019-12-26 DIAGNOSIS — Z992 Dependence on renal dialysis: Secondary | ICD-10-CM | POA: Diagnosis not present

## 2019-12-26 DIAGNOSIS — N2581 Secondary hyperparathyroidism of renal origin: Secondary | ICD-10-CM | POA: Diagnosis not present

## 2019-12-26 DIAGNOSIS — N186 End stage renal disease: Secondary | ICD-10-CM | POA: Diagnosis not present

## 2019-12-31 DIAGNOSIS — Z992 Dependence on renal dialysis: Secondary | ICD-10-CM | POA: Diagnosis not present

## 2019-12-31 DIAGNOSIS — N186 End stage renal disease: Secondary | ICD-10-CM | POA: Diagnosis not present

## 2019-12-31 DIAGNOSIS — N2581 Secondary hyperparathyroidism of renal origin: Secondary | ICD-10-CM | POA: Diagnosis not present

## 2020-01-02 DIAGNOSIS — N186 End stage renal disease: Secondary | ICD-10-CM | POA: Diagnosis not present

## 2020-01-02 DIAGNOSIS — Z992 Dependence on renal dialysis: Secondary | ICD-10-CM | POA: Diagnosis not present

## 2020-01-02 DIAGNOSIS — N2581 Secondary hyperparathyroidism of renal origin: Secondary | ICD-10-CM | POA: Diagnosis not present

## 2020-01-04 DIAGNOSIS — N186 End stage renal disease: Secondary | ICD-10-CM | POA: Diagnosis not present

## 2020-01-04 DIAGNOSIS — Z992 Dependence on renal dialysis: Secondary | ICD-10-CM | POA: Diagnosis not present

## 2020-01-04 DIAGNOSIS — N2581 Secondary hyperparathyroidism of renal origin: Secondary | ICD-10-CM | POA: Diagnosis not present

## 2020-02-06 DIAGNOSIS — E1122 Type 2 diabetes mellitus with diabetic chronic kidney disease: Secondary | ICD-10-CM | POA: Diagnosis not present

## 2020-02-06 DIAGNOSIS — N186 End stage renal disease: Secondary | ICD-10-CM | POA: Diagnosis not present

## 2020-02-06 DIAGNOSIS — N2581 Secondary hyperparathyroidism of renal origin: Secondary | ICD-10-CM | POA: Diagnosis not present

## 2020-02-06 DIAGNOSIS — Z992 Dependence on renal dialysis: Secondary | ICD-10-CM | POA: Diagnosis not present

## 2020-02-06 DIAGNOSIS — D509 Iron deficiency anemia, unspecified: Secondary | ICD-10-CM | POA: Diagnosis not present

## 2020-02-08 DIAGNOSIS — N186 End stage renal disease: Secondary | ICD-10-CM | POA: Diagnosis not present

## 2020-02-08 DIAGNOSIS — D509 Iron deficiency anemia, unspecified: Secondary | ICD-10-CM | POA: Diagnosis not present

## 2020-02-08 DIAGNOSIS — N2581 Secondary hyperparathyroidism of renal origin: Secondary | ICD-10-CM | POA: Diagnosis not present

## 2020-02-08 DIAGNOSIS — Z992 Dependence on renal dialysis: Secondary | ICD-10-CM | POA: Diagnosis not present

## 2020-02-11 DIAGNOSIS — N2581 Secondary hyperparathyroidism of renal origin: Secondary | ICD-10-CM | POA: Diagnosis not present

## 2020-02-11 DIAGNOSIS — D509 Iron deficiency anemia, unspecified: Secondary | ICD-10-CM | POA: Diagnosis not present

## 2020-02-11 DIAGNOSIS — Z992 Dependence on renal dialysis: Secondary | ICD-10-CM | POA: Diagnosis not present

## 2020-02-11 DIAGNOSIS — N186 End stage renal disease: Secondary | ICD-10-CM | POA: Diagnosis not present

## 2020-02-13 DIAGNOSIS — N2581 Secondary hyperparathyroidism of renal origin: Secondary | ICD-10-CM | POA: Diagnosis not present

## 2020-02-13 DIAGNOSIS — E1129 Type 2 diabetes mellitus with other diabetic kidney complication: Secondary | ICD-10-CM | POA: Diagnosis not present

## 2020-02-13 DIAGNOSIS — D509 Iron deficiency anemia, unspecified: Secondary | ICD-10-CM | POA: Diagnosis not present

## 2020-02-13 DIAGNOSIS — Z992 Dependence on renal dialysis: Secondary | ICD-10-CM | POA: Diagnosis not present

## 2020-02-13 DIAGNOSIS — N186 End stage renal disease: Secondary | ICD-10-CM | POA: Diagnosis not present

## 2020-02-15 DIAGNOSIS — Z992 Dependence on renal dialysis: Secondary | ICD-10-CM | POA: Diagnosis not present

## 2020-02-15 DIAGNOSIS — N2581 Secondary hyperparathyroidism of renal origin: Secondary | ICD-10-CM | POA: Diagnosis not present

## 2020-02-15 DIAGNOSIS — D509 Iron deficiency anemia, unspecified: Secondary | ICD-10-CM | POA: Diagnosis not present

## 2020-02-15 DIAGNOSIS — N186 End stage renal disease: Secondary | ICD-10-CM | POA: Diagnosis not present

## 2020-02-18 DIAGNOSIS — N2581 Secondary hyperparathyroidism of renal origin: Secondary | ICD-10-CM | POA: Diagnosis not present

## 2020-02-18 DIAGNOSIS — D509 Iron deficiency anemia, unspecified: Secondary | ICD-10-CM | POA: Diagnosis not present

## 2020-02-18 DIAGNOSIS — N186 End stage renal disease: Secondary | ICD-10-CM | POA: Diagnosis not present

## 2020-02-18 DIAGNOSIS — Z992 Dependence on renal dialysis: Secondary | ICD-10-CM | POA: Diagnosis not present

## 2020-02-22 DIAGNOSIS — D509 Iron deficiency anemia, unspecified: Secondary | ICD-10-CM | POA: Diagnosis not present

## 2020-02-22 DIAGNOSIS — Z992 Dependence on renal dialysis: Secondary | ICD-10-CM | POA: Diagnosis not present

## 2020-02-22 DIAGNOSIS — N186 End stage renal disease: Secondary | ICD-10-CM | POA: Diagnosis not present

## 2020-02-22 DIAGNOSIS — N2581 Secondary hyperparathyroidism of renal origin: Secondary | ICD-10-CM | POA: Diagnosis not present

## 2020-02-25 DIAGNOSIS — N186 End stage renal disease: Secondary | ICD-10-CM | POA: Diagnosis not present

## 2020-02-25 DIAGNOSIS — N2581 Secondary hyperparathyroidism of renal origin: Secondary | ICD-10-CM | POA: Diagnosis not present

## 2020-02-25 DIAGNOSIS — Z992 Dependence on renal dialysis: Secondary | ICD-10-CM | POA: Diagnosis not present

## 2020-02-25 DIAGNOSIS — D509 Iron deficiency anemia, unspecified: Secondary | ICD-10-CM | POA: Diagnosis not present

## 2020-02-27 DIAGNOSIS — D509 Iron deficiency anemia, unspecified: Secondary | ICD-10-CM | POA: Diagnosis not present

## 2020-02-27 DIAGNOSIS — N2581 Secondary hyperparathyroidism of renal origin: Secondary | ICD-10-CM | POA: Diagnosis not present

## 2020-02-27 DIAGNOSIS — N186 End stage renal disease: Secondary | ICD-10-CM | POA: Diagnosis not present

## 2020-02-27 DIAGNOSIS — Z992 Dependence on renal dialysis: Secondary | ICD-10-CM | POA: Diagnosis not present

## 2020-02-29 DIAGNOSIS — Z992 Dependence on renal dialysis: Secondary | ICD-10-CM | POA: Diagnosis not present

## 2020-02-29 DIAGNOSIS — N186 End stage renal disease: Secondary | ICD-10-CM | POA: Diagnosis not present

## 2020-02-29 DIAGNOSIS — N2581 Secondary hyperparathyroidism of renal origin: Secondary | ICD-10-CM | POA: Diagnosis not present

## 2020-02-29 DIAGNOSIS — D509 Iron deficiency anemia, unspecified: Secondary | ICD-10-CM | POA: Diagnosis not present

## 2020-03-03 DIAGNOSIS — N186 End stage renal disease: Secondary | ICD-10-CM | POA: Diagnosis not present

## 2020-03-03 DIAGNOSIS — D509 Iron deficiency anemia, unspecified: Secondary | ICD-10-CM | POA: Diagnosis not present

## 2020-03-03 DIAGNOSIS — Z992 Dependence on renal dialysis: Secondary | ICD-10-CM | POA: Diagnosis not present

## 2020-03-03 DIAGNOSIS — N2581 Secondary hyperparathyroidism of renal origin: Secondary | ICD-10-CM | POA: Diagnosis not present

## 2020-03-05 DIAGNOSIS — D509 Iron deficiency anemia, unspecified: Secondary | ICD-10-CM | POA: Diagnosis not present

## 2020-03-05 DIAGNOSIS — N186 End stage renal disease: Secondary | ICD-10-CM | POA: Diagnosis not present

## 2020-03-05 DIAGNOSIS — N2581 Secondary hyperparathyroidism of renal origin: Secondary | ICD-10-CM | POA: Diagnosis not present

## 2020-03-05 DIAGNOSIS — Z992 Dependence on renal dialysis: Secondary | ICD-10-CM | POA: Diagnosis not present

## 2020-03-07 DIAGNOSIS — N186 End stage renal disease: Secondary | ICD-10-CM | POA: Diagnosis not present

## 2020-03-07 DIAGNOSIS — N2581 Secondary hyperparathyroidism of renal origin: Secondary | ICD-10-CM | POA: Diagnosis not present

## 2020-03-07 DIAGNOSIS — Z992 Dependence on renal dialysis: Secondary | ICD-10-CM | POA: Diagnosis not present

## 2020-03-07 DIAGNOSIS — D509 Iron deficiency anemia, unspecified: Secondary | ICD-10-CM | POA: Diagnosis not present

## 2020-03-08 DIAGNOSIS — Z992 Dependence on renal dialysis: Secondary | ICD-10-CM | POA: Diagnosis not present

## 2020-03-08 DIAGNOSIS — E1122 Type 2 diabetes mellitus with diabetic chronic kidney disease: Secondary | ICD-10-CM | POA: Diagnosis not present

## 2020-03-08 DIAGNOSIS — N186 End stage renal disease: Secondary | ICD-10-CM | POA: Diagnosis not present

## 2020-03-10 DIAGNOSIS — N186 End stage renal disease: Secondary | ICD-10-CM | POA: Diagnosis not present

## 2020-03-10 DIAGNOSIS — D509 Iron deficiency anemia, unspecified: Secondary | ICD-10-CM | POA: Diagnosis not present

## 2020-03-10 DIAGNOSIS — E1129 Type 2 diabetes mellitus with other diabetic kidney complication: Secondary | ICD-10-CM | POA: Diagnosis not present

## 2020-03-10 DIAGNOSIS — N2581 Secondary hyperparathyroidism of renal origin: Secondary | ICD-10-CM | POA: Diagnosis not present

## 2020-03-10 DIAGNOSIS — Z992 Dependence on renal dialysis: Secondary | ICD-10-CM | POA: Diagnosis not present

## 2020-03-12 DIAGNOSIS — N186 End stage renal disease: Secondary | ICD-10-CM | POA: Diagnosis not present

## 2020-03-12 DIAGNOSIS — E1129 Type 2 diabetes mellitus with other diabetic kidney complication: Secondary | ICD-10-CM | POA: Diagnosis not present

## 2020-03-12 DIAGNOSIS — N2581 Secondary hyperparathyroidism of renal origin: Secondary | ICD-10-CM | POA: Diagnosis not present

## 2020-03-12 DIAGNOSIS — D509 Iron deficiency anemia, unspecified: Secondary | ICD-10-CM | POA: Diagnosis not present

## 2020-03-12 DIAGNOSIS — Z992 Dependence on renal dialysis: Secondary | ICD-10-CM | POA: Diagnosis not present

## 2020-03-14 DIAGNOSIS — N186 End stage renal disease: Secondary | ICD-10-CM | POA: Diagnosis not present

## 2020-03-14 DIAGNOSIS — Z992 Dependence on renal dialysis: Secondary | ICD-10-CM | POA: Diagnosis not present

## 2020-03-14 DIAGNOSIS — D509 Iron deficiency anemia, unspecified: Secondary | ICD-10-CM | POA: Diagnosis not present

## 2020-03-14 DIAGNOSIS — N2581 Secondary hyperparathyroidism of renal origin: Secondary | ICD-10-CM | POA: Diagnosis not present

## 2020-03-14 DIAGNOSIS — E1129 Type 2 diabetes mellitus with other diabetic kidney complication: Secondary | ICD-10-CM | POA: Diagnosis not present

## 2020-03-16 DIAGNOSIS — E1051 Type 1 diabetes mellitus with diabetic peripheral angiopathy without gangrene: Secondary | ICD-10-CM | POA: Diagnosis not present

## 2020-03-16 DIAGNOSIS — L603 Nail dystrophy: Secondary | ICD-10-CM | POA: Diagnosis not present

## 2020-03-16 DIAGNOSIS — I739 Peripheral vascular disease, unspecified: Secondary | ICD-10-CM | POA: Diagnosis not present

## 2020-03-16 DIAGNOSIS — L97521 Non-pressure chronic ulcer of other part of left foot limited to breakdown of skin: Secondary | ICD-10-CM | POA: Diagnosis not present

## 2020-03-17 DIAGNOSIS — D509 Iron deficiency anemia, unspecified: Secondary | ICD-10-CM | POA: Diagnosis not present

## 2020-03-17 DIAGNOSIS — N2581 Secondary hyperparathyroidism of renal origin: Secondary | ICD-10-CM | POA: Diagnosis not present

## 2020-03-17 DIAGNOSIS — Z992 Dependence on renal dialysis: Secondary | ICD-10-CM | POA: Diagnosis not present

## 2020-03-17 DIAGNOSIS — E1129 Type 2 diabetes mellitus with other diabetic kidney complication: Secondary | ICD-10-CM | POA: Diagnosis not present

## 2020-03-17 DIAGNOSIS — N186 End stage renal disease: Secondary | ICD-10-CM | POA: Diagnosis not present

## 2020-03-19 DIAGNOSIS — N186 End stage renal disease: Secondary | ICD-10-CM | POA: Diagnosis not present

## 2020-03-19 DIAGNOSIS — N2581 Secondary hyperparathyroidism of renal origin: Secondary | ICD-10-CM | POA: Diagnosis not present

## 2020-03-19 DIAGNOSIS — Z992 Dependence on renal dialysis: Secondary | ICD-10-CM | POA: Diagnosis not present

## 2020-03-19 DIAGNOSIS — E1129 Type 2 diabetes mellitus with other diabetic kidney complication: Secondary | ICD-10-CM | POA: Diagnosis not present

## 2020-03-19 DIAGNOSIS — D509 Iron deficiency anemia, unspecified: Secondary | ICD-10-CM | POA: Diagnosis not present

## 2020-03-21 DIAGNOSIS — N2581 Secondary hyperparathyroidism of renal origin: Secondary | ICD-10-CM | POA: Diagnosis not present

## 2020-03-21 DIAGNOSIS — E1129 Type 2 diabetes mellitus with other diabetic kidney complication: Secondary | ICD-10-CM | POA: Diagnosis not present

## 2020-03-21 DIAGNOSIS — N186 End stage renal disease: Secondary | ICD-10-CM | POA: Diagnosis not present

## 2020-03-21 DIAGNOSIS — D509 Iron deficiency anemia, unspecified: Secondary | ICD-10-CM | POA: Diagnosis not present

## 2020-03-21 DIAGNOSIS — Z992 Dependence on renal dialysis: Secondary | ICD-10-CM | POA: Diagnosis not present

## 2020-03-24 DIAGNOSIS — N186 End stage renal disease: Secondary | ICD-10-CM | POA: Diagnosis not present

## 2020-03-24 DIAGNOSIS — N2581 Secondary hyperparathyroidism of renal origin: Secondary | ICD-10-CM | POA: Diagnosis not present

## 2020-03-24 DIAGNOSIS — D509 Iron deficiency anemia, unspecified: Secondary | ICD-10-CM | POA: Diagnosis not present

## 2020-03-24 DIAGNOSIS — Z992 Dependence on renal dialysis: Secondary | ICD-10-CM | POA: Diagnosis not present

## 2020-03-24 DIAGNOSIS — E1129 Type 2 diabetes mellitus with other diabetic kidney complication: Secondary | ICD-10-CM | POA: Diagnosis not present

## 2020-03-26 DIAGNOSIS — E1129 Type 2 diabetes mellitus with other diabetic kidney complication: Secondary | ICD-10-CM | POA: Diagnosis not present

## 2020-03-26 DIAGNOSIS — N2581 Secondary hyperparathyroidism of renal origin: Secondary | ICD-10-CM | POA: Diagnosis not present

## 2020-03-26 DIAGNOSIS — Z992 Dependence on renal dialysis: Secondary | ICD-10-CM | POA: Diagnosis not present

## 2020-03-26 DIAGNOSIS — D509 Iron deficiency anemia, unspecified: Secondary | ICD-10-CM | POA: Diagnosis not present

## 2020-03-26 DIAGNOSIS — N186 End stage renal disease: Secondary | ICD-10-CM | POA: Diagnosis not present

## 2020-03-28 DIAGNOSIS — D509 Iron deficiency anemia, unspecified: Secondary | ICD-10-CM | POA: Diagnosis not present

## 2020-03-28 DIAGNOSIS — N2581 Secondary hyperparathyroidism of renal origin: Secondary | ICD-10-CM | POA: Diagnosis not present

## 2020-03-28 DIAGNOSIS — E1129 Type 2 diabetes mellitus with other diabetic kidney complication: Secondary | ICD-10-CM | POA: Diagnosis not present

## 2020-03-28 DIAGNOSIS — Z992 Dependence on renal dialysis: Secondary | ICD-10-CM | POA: Diagnosis not present

## 2020-03-28 DIAGNOSIS — N186 End stage renal disease: Secondary | ICD-10-CM | POA: Diagnosis not present

## 2020-03-30 DIAGNOSIS — L97512 Non-pressure chronic ulcer of other part of right foot with fat layer exposed: Secondary | ICD-10-CM | POA: Diagnosis not present

## 2020-03-31 DIAGNOSIS — Z992 Dependence on renal dialysis: Secondary | ICD-10-CM | POA: Diagnosis not present

## 2020-03-31 DIAGNOSIS — D509 Iron deficiency anemia, unspecified: Secondary | ICD-10-CM | POA: Diagnosis not present

## 2020-03-31 DIAGNOSIS — E1129 Type 2 diabetes mellitus with other diabetic kidney complication: Secondary | ICD-10-CM | POA: Diagnosis not present

## 2020-03-31 DIAGNOSIS — N186 End stage renal disease: Secondary | ICD-10-CM | POA: Diagnosis not present

## 2020-03-31 DIAGNOSIS — N2581 Secondary hyperparathyroidism of renal origin: Secondary | ICD-10-CM | POA: Diagnosis not present

## 2020-04-02 DIAGNOSIS — N186 End stage renal disease: Secondary | ICD-10-CM | POA: Diagnosis not present

## 2020-04-02 DIAGNOSIS — D509 Iron deficiency anemia, unspecified: Secondary | ICD-10-CM | POA: Diagnosis not present

## 2020-04-02 DIAGNOSIS — Z992 Dependence on renal dialysis: Secondary | ICD-10-CM | POA: Diagnosis not present

## 2020-04-02 DIAGNOSIS — N2581 Secondary hyperparathyroidism of renal origin: Secondary | ICD-10-CM | POA: Diagnosis not present

## 2020-04-02 DIAGNOSIS — E1129 Type 2 diabetes mellitus with other diabetic kidney complication: Secondary | ICD-10-CM | POA: Diagnosis not present

## 2020-04-04 DIAGNOSIS — E1129 Type 2 diabetes mellitus with other diabetic kidney complication: Secondary | ICD-10-CM | POA: Diagnosis not present

## 2020-04-04 DIAGNOSIS — N186 End stage renal disease: Secondary | ICD-10-CM | POA: Diagnosis not present

## 2020-04-04 DIAGNOSIS — D509 Iron deficiency anemia, unspecified: Secondary | ICD-10-CM | POA: Diagnosis not present

## 2020-04-04 DIAGNOSIS — Z992 Dependence on renal dialysis: Secondary | ICD-10-CM | POA: Diagnosis not present

## 2020-04-04 DIAGNOSIS — N2581 Secondary hyperparathyroidism of renal origin: Secondary | ICD-10-CM | POA: Diagnosis not present

## 2020-04-07 DIAGNOSIS — Z992 Dependence on renal dialysis: Secondary | ICD-10-CM | POA: Diagnosis not present

## 2020-04-07 DIAGNOSIS — N2581 Secondary hyperparathyroidism of renal origin: Secondary | ICD-10-CM | POA: Diagnosis not present

## 2020-04-07 DIAGNOSIS — E1129 Type 2 diabetes mellitus with other diabetic kidney complication: Secondary | ICD-10-CM | POA: Diagnosis not present

## 2020-04-07 DIAGNOSIS — D509 Iron deficiency anemia, unspecified: Secondary | ICD-10-CM | POA: Diagnosis not present

## 2020-04-07 DIAGNOSIS — N186 End stage renal disease: Secondary | ICD-10-CM | POA: Diagnosis not present

## 2020-04-08 DIAGNOSIS — E1122 Type 2 diabetes mellitus with diabetic chronic kidney disease: Secondary | ICD-10-CM | POA: Diagnosis not present

## 2020-04-08 DIAGNOSIS — N186 End stage renal disease: Secondary | ICD-10-CM | POA: Diagnosis not present

## 2020-04-08 DIAGNOSIS — Z992 Dependence on renal dialysis: Secondary | ICD-10-CM | POA: Diagnosis not present

## 2020-04-09 DIAGNOSIS — N186 End stage renal disease: Secondary | ICD-10-CM | POA: Diagnosis not present

## 2020-04-09 DIAGNOSIS — N2581 Secondary hyperparathyroidism of renal origin: Secondary | ICD-10-CM | POA: Diagnosis not present

## 2020-04-09 DIAGNOSIS — D509 Iron deficiency anemia, unspecified: Secondary | ICD-10-CM | POA: Diagnosis not present

## 2020-04-09 DIAGNOSIS — Z992 Dependence on renal dialysis: Secondary | ICD-10-CM | POA: Diagnosis not present

## 2020-04-09 DIAGNOSIS — Z23 Encounter for immunization: Secondary | ICD-10-CM | POA: Diagnosis not present

## 2020-04-11 ENCOUNTER — Encounter (HOSPITAL_COMMUNITY): Payer: Self-pay

## 2020-04-11 ENCOUNTER — Other Ambulatory Visit: Payer: Self-pay

## 2020-04-11 ENCOUNTER — Emergency Department (HOSPITAL_COMMUNITY): Payer: Medicare Other

## 2020-04-11 ENCOUNTER — Emergency Department (HOSPITAL_COMMUNITY)
Admission: EM | Admit: 2020-04-11 | Discharge: 2020-04-11 | Disposition: A | Discharge status: Home / Self Care | Payer: Medicare Other | Attending: Emergency Medicine | Admitting: Emergency Medicine

## 2020-04-11 DIAGNOSIS — R110 Nausea: Secondary | ICD-10-CM | POA: Diagnosis not present

## 2020-04-11 DIAGNOSIS — E119 Type 2 diabetes mellitus without complications: Secondary | ICD-10-CM | POA: Diagnosis not present

## 2020-04-11 DIAGNOSIS — Z7982 Long term (current) use of aspirin: Secondary | ICD-10-CM | POA: Insufficient documentation

## 2020-04-11 DIAGNOSIS — N186 End stage renal disease: Secondary | ICD-10-CM | POA: Insufficient documentation

## 2020-04-11 DIAGNOSIS — Z87891 Personal history of nicotine dependence: Secondary | ICD-10-CM | POA: Diagnosis not present

## 2020-04-11 DIAGNOSIS — R404 Transient alteration of awareness: Secondary | ICD-10-CM | POA: Diagnosis not present

## 2020-04-11 DIAGNOSIS — E162 Hypoglycemia, unspecified: Secondary | ICD-10-CM | POA: Diagnosis not present

## 2020-04-11 DIAGNOSIS — I2510 Atherosclerotic heart disease of native coronary artery without angina pectoris: Secondary | ICD-10-CM | POA: Diagnosis not present

## 2020-04-11 DIAGNOSIS — R05 Cough: Secondary | ICD-10-CM | POA: Diagnosis not present

## 2020-04-11 DIAGNOSIS — J45909 Unspecified asthma, uncomplicated: Secondary | ICD-10-CM | POA: Insufficient documentation

## 2020-04-11 DIAGNOSIS — Z79899 Other long term (current) drug therapy: Secondary | ICD-10-CM | POA: Insufficient documentation

## 2020-04-11 DIAGNOSIS — Z20822 Contact with and (suspected) exposure to covid-19: Secondary | ICD-10-CM | POA: Diagnosis not present

## 2020-04-11 DIAGNOSIS — R1011 Right upper quadrant pain: Secondary | ICD-10-CM

## 2020-04-11 DIAGNOSIS — Z794 Long term (current) use of insulin: Secondary | ICD-10-CM | POA: Diagnosis not present

## 2020-04-11 DIAGNOSIS — K8020 Calculus of gallbladder without cholecystitis without obstruction: Secondary | ICD-10-CM

## 2020-04-11 DIAGNOSIS — I132 Hypertensive heart and chronic kidney disease with heart failure and with stage 5 chronic kidney disease, or end stage renal disease: Secondary | ICD-10-CM | POA: Diagnosis not present

## 2020-04-11 DIAGNOSIS — I5042 Chronic combined systolic (congestive) and diastolic (congestive) heart failure: Secondary | ICD-10-CM | POA: Insufficient documentation

## 2020-04-11 DIAGNOSIS — I10 Essential (primary) hypertension: Secondary | ICD-10-CM | POA: Diagnosis not present

## 2020-04-11 DIAGNOSIS — E161 Other hypoglycemia: Secondary | ICD-10-CM | POA: Diagnosis not present

## 2020-04-11 DIAGNOSIS — I517 Cardiomegaly: Secondary | ICD-10-CM | POA: Diagnosis not present

## 2020-04-11 DIAGNOSIS — R52 Pain, unspecified: Secondary | ICD-10-CM | POA: Diagnosis not present

## 2020-04-11 DIAGNOSIS — R1084 Generalized abdominal pain: Secondary | ICD-10-CM | POA: Diagnosis not present

## 2020-04-11 LAB — SARS CORONAVIRUS 2 BY RT PCR (HOSPITAL ORDER, PERFORMED IN ~~LOC~~ HOSPITAL LAB): SARS Coronavirus 2: NEGATIVE

## 2020-04-11 LAB — BASIC METABOLIC PANEL WITH GFR
Anion gap: 14 (ref 5–15)
BUN: 38 mg/dL — ABNORMAL HIGH (ref 8–23)
CO2: 29 mmol/L (ref 22–32)
Calcium: 9.6 mg/dL (ref 8.9–10.3)
Chloride: 95 mmol/L — ABNORMAL LOW (ref 98–111)
Creatinine, Ser: 5.76 mg/dL — ABNORMAL HIGH (ref 0.44–1.00)
GFR calc Af Amer: 8 mL/min — ABNORMAL LOW
GFR calc non Af Amer: 7 mL/min — ABNORMAL LOW
Glucose, Bld: 64 mg/dL — ABNORMAL LOW (ref 70–99)
Potassium: 4.8 mmol/L (ref 3.5–5.1)
Sodium: 138 mmol/L (ref 135–145)

## 2020-04-11 LAB — HEPATIC FUNCTION PANEL
ALT: 14 U/L (ref 0–44)
AST: 21 U/L (ref 15–41)
Albumin: 3.7 g/dL (ref 3.5–5.0)
Alkaline Phosphatase: 78 U/L (ref 38–126)
Bilirubin, Direct: 0.3 mg/dL — ABNORMAL HIGH (ref 0.0–0.2)
Indirect Bilirubin: 0.9 mg/dL (ref 0.3–0.9)
Total Bilirubin: 1.2 mg/dL (ref 0.3–1.2)
Total Protein: 7.1 g/dL (ref 6.5–8.1)

## 2020-04-11 LAB — CBC
HCT: 39.5 % (ref 36.0–46.0)
Hemoglobin: 12 g/dL (ref 12.0–15.0)
MCH: 31 pg (ref 26.0–34.0)
MCHC: 30.4 g/dL (ref 30.0–36.0)
MCV: 102.1 fL — ABNORMAL HIGH (ref 80.0–100.0)
Platelets: 230 10*3/uL (ref 150–400)
RBC: 3.87 MIL/uL (ref 3.87–5.11)
RDW: 13.2 % (ref 11.5–15.5)
WBC: 5.6 10*3/uL (ref 4.0–10.5)
nRBC: 0 % (ref 0.0–0.2)

## 2020-04-11 LAB — CBG MONITORING, ED
Glucose-Capillary: 59 mg/dL — ABNORMAL LOW (ref 70–99)
Glucose-Capillary: 96 mg/dL (ref 70–99)
Glucose-Capillary: 135 mg/dL — ABNORMAL HIGH (ref 70–99)

## 2020-04-11 LAB — LIPASE, BLOOD: Lipase: 31 U/L (ref 11–51)

## 2020-04-11 MED ORDER — ONDANSETRON HCL 4 MG/2ML IJ SOLN
4.0000 mg | Freq: Once | INTRAMUSCULAR | Status: DC
  Filled 2020-04-11: qty 2

## 2020-04-11 MED ORDER — SODIUM CHLORIDE 0.9 % IV BOLUS
250.0000 mL | Freq: Once | INTRAVENOUS | Status: AC
  Administered 2020-04-11: 250 mL via INTRAVENOUS

## 2020-04-11 NOTE — ED Provider Notes (Signed)
[facility] EMERGENCY DEPARTMENT Provider Note   CSN: [id number] Arrival date & time: 04/11/20  4034     History No chief complaint on file.   Kathleen [name] is a 74 y.o. female.  Prescribed 30u insulin at bedtime from doctor but only takes 20u intermittenly because she feels it's too much. Last took it two nights ago. Did not take her insulin last night because "I didn't need it". Because she hasn't been eating. Not feeling well since Thursday (2 days). Has had nausea and non-bloody vomiting with abdominal cramping and normal BMs. Also having SOB since yesterday but no cough. Has been taking her medications even though not eating. She does not check her blood sugars at home. Her son called ambulance this morning because she hasn't been eating. Last ate Thursday. Was able to tolerate the orange juice given here and has no current nausea Lives with son. Dialysis TThS- due today. Endorses N/NBV, SOB since yesterday.  Denies diarrhea, abdominal pain, fevers, cough Not covid vaccinated. No known exposures.     Past Medical History:  Diagnosis Date  . Anemia    a. 01/2014: suspected of chronic disease, negative FOBT.  [name]    knees  . Asthma    many years ago  . CAD (coronary artery disease)    a. Cath 01/2014: mild in LAD, mild-mod LCx, mod-severe RCA - for med rx.  . Diabetes mellitus without complication ([place])   . ESRD (end stage renal disease) ([place])    [place]  . History of echocardiogram    Echo (10/15):  Mod LVH, EF 50-55%, Gr 1 DD, MAC, mild MR, mild TR, PASP 35 mmHg  . Hx of transfusion   . Hyperlipidemia   . Hypertension   . Meningioma ([place])    a. Incidental dx 01/2014.  . Obesity   . Renal insufficiency   . Systolic CHF ([place])    a. 02/4258: dx with mixed ICM/NICM (out of proportion to CAD) EF 25-30% by echo.     Patient Active Problem List   Diagnosis Date Noted  . Pulmonary edema 01/29/2019  . Hyperkalemia 01/29/2019  .  Acute on chronic diastolic CHF (congestive heart failure) ([place]) 01/29/2019  . Acute respiratory failure with hypoxia ([place]) 01/29/2019  . Hematochezia   . Benign neoplasm of cecum   . Benign neoplasm of ascending colon   . Benign neoplasm of sigmoid colon   . Gastritis and gastroduodenitis   . Rectal bleeding 09/01/2018  . CVA (cerebral vascular accident) ([place]) 11/09/2017  . Acute CVA (cerebrovascular accident) ([place]) 11/08/2017  . DM (diabetes mellitus), type 2 with renal complications ([place]) 56/38/7564  . Altered mental status 07/14/2017  . Knee pain   . Acute lower UTI   . Peripheral arterial disease ([place]) 02/23/2016  . Insulin dependent diabetes mellitus 12/18/2015  . Anemia in chronic kidney disease 12/18/2015  . Dyspnea   . Goals of care, counseling/discussion   . SOB (shortness of breath)   . Encounter for palliative care   . Pulmonary vascular congestion   . Pleural effusion, left   . HLD (hyperlipidemia)   . Noncompliance with medication regimen   . Disorientation   . Somnolence   . Acute respiratory failure with hypercapnia ([place])   . Respiratory acidosis   . Hypernatremia   . Systolic and diastolic CHF, acute ([place])   . CHF, acute on chronic ([place]) 08/05/2015  . Elevated troponin 08/05/2015  . CHF (congestive heart failure) ([place])  08/05/2015  . Acute on chronic heart failure ([place]) 08/05/2015  . Acute on chronic combined systolic and diastolic congestive heart failure ([place])   . Left carotid bruit 11/20/2014  . Syncope and collapse 06/10/2014  . Syncope 06/10/2014  . Essential hypertension   . Unspecified vitamin D deficiency   . Cellulitis and abscess of right lower extremitiy 02/28/2014  . Chronic combined systolic and diastolic heart failure ([place]) 02/12/2014  . CAD (coronary artery disease)   . Obesity   . Meningioma ([place])   . Anemia   . ESRD on dialysis ([place]) 01/24/2014  . Hyperlipidemia 01/22/2014  . BPPV (benign paroxysmal positional vertigo) 01/22/2014  .  Hypertensive urgency 01/21/2014  . Tobacco abuse 01/21/2014  . Diabetes mellitus due to abnormal insulin ([place]) 01/21/2014    Past Surgical History:  Procedure Laterality Date  . ABDOMINAL HYSTERECTOMY    . AV FISTULA PLACEMENT Left 02/10/2017   Procedure: ARTERIOVENOUS (AV) FISTULA CREATION-LEFT FOREARM;  Surgeon: Elam Dutch, MD;  Location: [place];  Service: Vascular;  Laterality: Left;  . BIOPSY  09/04/2018   Procedure: BIOPSY;  Surgeon: Jerene Bears, MD;  Location[facility]COPY;  Service: Gastroenterology;;  . BREAST SURGERY Right    Lumpectomy  . COLONOSCOPY    . COLONOSCOPY WITH PROPOFOL N/A 09/04/2018   Procedure: COLONOSCOPY WITH PROPOFOL;  Surgeon: Jerene Bears, MD;  Location: [place];  Service: Gastroenterology;  Laterality: N/A;  . ESOPHAGOGASTRODUODENOSCOPY (EGD) WITH PROPOFOL N/A 09/04/2018   Procedure: ESOPHAGOGASTRODUODENOSCOPY (EGD) WITH PROPOFOL;  Surgeon: Jerene Bears, MD;  Location: [facility] ENDOSCOPY;  Service: Gastroenterology;  Laterality: N/A;  . FISTULA SUPERFICIALIZATION Left 08/09/2017   Procedure: FISTULA SUPERFICIALIZATION LEFT ARM ARTERIOVENOUS FISTULA;  Surgeon: Conrad Humnoke, MD;  Location: [place];  Service: Vascular;  Laterality: Left;  . INSERTION OF DIALYSIS CATHETER Right 02/10/2017   Procedure: INSERTION OF DIALYSIS CATHETER;  Surgeon: Elam Dutch, MD;  Location: [place];  Service: Vascular;  Laterality: Right;  . LEFT HEART CATHETERIZATION WITH CORONARY ANGIOGRAM N/A 01/27/2014   Procedure: LEFT HEART CATHETERIZATION WITH CORONARY ANGIOGRAM;  Surgeon: Jettie Booze, MD;  Location: [facility] CATH LAB;  Service: Cardiovascular;  Laterality: N/A;  . POLYPECTOMY  09/04/2018   Procedure: POLYPECTOMY;  Surgeon: Jerene Bears, MD;  Location: [facility] ENDOSCOPY;  Service: Gastroenterology;;  . TONSILLECTOMY       OB History   No obstetric history on file.     Family History  Problem Relation Age of Onset  . Diabetic kidney disease Mother   . Hypertension  Mother   . Heart failure Mother   . Heart attack Mother   . Hyperlipidemia Mother   . Hypertension Father     Social History   Tobacco Use  . Smoking status: Former Smoker    Packs/day: 0.25    Years: 30.00    Pack years: 7.50    Types: Cigarettes  . Smokeless tobacco: Never Used  . Tobacco comment: quit late 1990's  Vaping Use  . Vaping Use: Never used  Substance Use Topics  . Alcohol use: No  . Drug use: No    Home Medications Prior to Admission medications   Medication Sig Start Date End Date Taking? Authorizing Provider  acetaminophen (TYLENOL) 325 MG tablet Take 2 tablets (650 mg total) by mouth every 4 (four) hours as needed for mild pain (or temp > 37.5 C (99.5 F)). 11/11/17   [name], MD  aspirin 81 MG EC tablet Take 4 tablets (325 mg  total) by mouth daily. Patient taking differently: Take 162 mg by mouth daily.  11/11/17   [name], MD  atorvastatin (LIPITOR) 40 MG tablet Take 1 tablet (40 mg total) by mouth daily at 6 PM. Patient not taking: Reported on 01/29/2019 07/19/17   Aline August, MD  brimonidine-timolol (COMBIGAN) 0.2-0.5 % ophthalmic solution Place 1 drop into both eyes every 12 (twelve) hours.     [provider]  calcium acetate (PHOSLO) 667 MG capsule Take 1 capsule (667 mg total) by mouth as needed (with snacks). Patient taking differently: Take 667-2,001 mg by mouth See admin instructions. Take 3 capsules (2062m) with a meal three times a day. Take 667 mg with snack 11/11/17   RDebbe Odea MD  carvedilol (COREG) 25 MG tablet Take 25 mg by mouth 2 (two) times daily. 03/28/17   [provider]  hydrALAZINE (APRESOLINE) 25 MG tablet Take 1 tablet (25 mg total) by mouth every 8 (eight) hours as needed (SBP > 170  or DBP > 110). Patient taking differently: Take 50 mg by mouth 3 (three) times daily.  02/20/17   Mikhail, [name] DO  insulin detemir (LEVEMIR) 100 UNIT/ML injection Inject 0.15 mLs (15 Units total) into the skin at  bedtime. Patient taking differently: Inject 30 Units into the skin at bedtime.  02/20/17   Mikhail, [name] DO  latanoprost (XALATAN) 0.005 % ophthalmic solution Place 1 drop into both eyes every evening.    [provider]  Multiple Vitamins-Minerals (RENAPLEX) TABS Take 1 tablet by mouth daily. 07/10/18   [provider]  pantoprazole (PROTONIX) 40 MG tablet Take 1 tablet (40 mg total) by mouth 2 (two) times daily before a meal. Take 1 tablet twice daily for a  Month then once dailly 09/04/18   GCaren Griffins MD  polyethylene glycol (MIRALAX / GLYCOLAX) packet Take 17 g by mouth daily. Patient taking differently: Take 17 g by mouth daily as needed for mild constipation.  02/20/17   Mikhail, [name] DO  timolol (TIMOPTIC) 0.5 % ophthalmic solution Place 1 drop into both eyes daily.    [provider]    Allergies    Baclofen and Chlorhexidine  Review of Systems   Review of Systems  Physical Exam Updated Vital Signs BP (!) 155/56   Pulse 86   Temp (!) 97.5 F (36.4 C) (Oral)   Resp (!) 35   Ht _0  (1.6 m)   Wt 81.6 kg   LMP  (LMP Unknown)   SpO2 96%   BMI 31.89 kg/m   Physical Exam Vitals and nursing note reviewed.  Constitutional:      Appearance: She is ill-appearing.     Comments: Sleepy but easily aroused by verbal  Neurological:     Mental Status: She is alert.     ED Results / Procedures / Treatments   Labs (all labs ordered are listed, but only abnormal results are displayed) Labs Reviewed  BASIC METABOLIC PANEL - Abnormal; Notable for the following components:      Result Value   Chloride 95 (*)    Glucose, Bld 64 (*)    BUN 38 (*)    Creatinine, Ser 5.76 (*)    GFR calc non Af Amer 7 (*)    GFR calc Af Amer 8 (*)    All other components within normal limits  CBC - Abnormal; Notable for the following components:   MCV 102.1 (*)    All other components within normal limits  HEPATIC  FUNCTION PANEL - Abnormal; Notable for the  following components:   Bilirubin, Direct 0.3 (*)    All other components within normal limits  CBG MONITORING, ED - Abnormal; Notable for the following components:   Glucose-Capillary 59 (*)    All other components within normal limits  CBG MONITORING, ED - Abnormal; Notable for the following components:   Glucose-Capillary 135 (*)    All other components within normal limits  SARS CORONAVIRUS 2 BY RT PCR (HOSPITAL ORDER, [place] LAB)  LIPASE, BLOOD  URINALYSIS, ROUTINE W REFLEX MICROSCOPIC  CBG MONITORING, ED   EKG EKG Interpretation  Date/Time:  Saturday April 11 2020 09:24:09 EDT Ventricular Rate:  71 PR Interval:  184 QRS Duration: 92 QT Interval:  458 QTC Calculation: 497 R Axis:   19 Text Interpretation: Normal sinus rhythm Minimal voltage criteria for LVH, may be normal variant ( Cornell product ) T wave abnormality, consider lateral ischemia new Prolonged QT Confirmed by Blanchie Dessert [phone]) on 04/11/2020 10:11:15 AM   Radiology DG Chest Port 1 View  Result Date: 04/11/2020 CLINICAL DATA:  Cough EXAM: PORTABLE CHEST 1 VIEW COMPARISON:  07/28/2019 FINDINGS: Mild bibasilar scarring/atelectasis. No focal consolidation. No pleural effusion or pneumothorax. Cardiomegaly. IMPRESSION: No evidence of acute cardiopulmonary disease. Electronically Signed   By: Julian Hy M.D.   On: 04/11/2020 10:44   US Abdomen Limited RUQ  Result Date: 04/11/2020 CLINICAL DATA:  Right upper quadrant pain for 3 days. EXAM: ULTRASOUND ABDOMEN LIMITED RIGHT UPPER QUADRANT COMPARISON:  CT 12/22/2015 and renal ultrasound of 02/08/2017. FINDINGS: Gallbladder: Multiple gallstones up to 9 mm. No wall thickening or pericholecystic fluid. The technologist describes tenderness with gallbladder palpation. Common bile duct: Diameter: Normal, 5 mm. Liver: No focal lesion identified. Within normal limits in parenchymal echogenicity. Portal vein is patent on color Doppler  imaging with normal direction of blood flow towards the liver. Other: note is made of a in atrophic right kidney with a simple 2.9 cm cyst within. IMPRESSION: Cholelithiasis without gallbladder wall thickening or pericholecystic fluid. Technologist describes tenderness with gallbladder palpation, nonspecific given absence of other secondary signs. Right renal atrophy. Electronically Signed   By: Abigail Miyamoto M.D.   On: 04/11/2020 13:01    Procedures Procedures (including critical care time)  Medications Ordered in ED Medications  ondansetron (ZOFRAN) injection 4 mg (4 mg Intravenous Not Given 04/11/20 1228)  sodium chloride 0.9 % bolus 250 mL (250 mLs Intravenous New Bag/Given 04/11/20 1201)    ED Course  I have reviewed the triage vital signs and the nursing notes.  Pertinent labs & imaging results that were available during my care of the patient were reviewed by me and considered in my medical decision making (see chart for details).    MDM Rules/Calculators/A&P                          Diabetic patient presents with poor PO due to nausea and abdominal pain along with hypoglycemia. She was given d5 and orange juice which she was able to tolerate and had BS normalize to 135. She has no nausea at this time and would like to eat. Update: she was able to tolerate PO without pain or nausea.  RUQ US showed cholelithiases without signs of inflammation. Chest xray neg. Would recommend follow up with PCP to check hgb A1c and assess for need to discontinue insulin use. Would also recommend discussion about treatment options for cholelithiasis as  this could have been the cause of her current symptoms.  COVID negative. Final Clinical Impression(s) / ED Diagnoses Final diagnoses:  RUQ pain    Rx / DC Orders ED Discharge Orders    None       Richarda Osmond, DO 04/11/20 1416    Blanchie Dessert, MD 04/11/20 1724

## 2020-04-11 NOTE — Discharge Instructions (Addendum)
-   you had gallstone on the ultrasound which could be the cause of your symptoms. There was no signs of obstruction or infection at this time. I would recommend following up with your PCP to discuss the need for treatment. - your low blood sugars have been resolved. I would recommend you hold the insulin until you can follow up with your primary doctor this next week. - follow up with dialysis on your next scheduled day.

## 2020-04-11 NOTE — ED Notes (Addendum)
Glucose 59, OJ and protein provided. Patient alert and oriented.

## 2020-04-11 NOTE — ED Triage Notes (Signed)
Patient arrived by [facility] for malaise and hypoglycemia. Reports decreased appetite x 2 days and last dose of insulin yesterday, also due for dialysis today. Patient found to have BS at home of 40, D10 administered and CBG 110 pta. On arrival patient alert and oriented, BS 68. OJ given. 1 bed bug found at home and patient showered on arrival.

## 2020-04-14 DIAGNOSIS — N186 End stage renal disease: Secondary | ICD-10-CM | POA: Diagnosis not present

## 2020-04-14 DIAGNOSIS — Z23 Encounter for immunization: Secondary | ICD-10-CM | POA: Diagnosis not present

## 2020-04-14 DIAGNOSIS — D509 Iron deficiency anemia, unspecified: Secondary | ICD-10-CM | POA: Diagnosis not present

## 2020-04-14 DIAGNOSIS — N2581 Secondary hyperparathyroidism of renal origin: Secondary | ICD-10-CM | POA: Diagnosis not present

## 2020-04-14 DIAGNOSIS — Z992 Dependence on renal dialysis: Secondary | ICD-10-CM | POA: Diagnosis not present

## 2020-04-14 LAB — CBG MONITORING, ED: Glucose-Capillary: 68 mg/dL — ABNORMAL LOW (ref 70–99)

## 2020-04-16 DIAGNOSIS — N186 End stage renal disease: Secondary | ICD-10-CM | POA: Diagnosis not present

## 2020-04-16 DIAGNOSIS — N2581 Secondary hyperparathyroidism of renal origin: Secondary | ICD-10-CM | POA: Diagnosis not present

## 2020-04-16 DIAGNOSIS — Z23 Encounter for immunization: Secondary | ICD-10-CM | POA: Diagnosis not present

## 2020-04-16 DIAGNOSIS — D509 Iron deficiency anemia, unspecified: Secondary | ICD-10-CM | POA: Diagnosis not present

## 2020-04-16 DIAGNOSIS — Z992 Dependence on renal dialysis: Secondary | ICD-10-CM | POA: Diagnosis not present

## 2020-04-18 DIAGNOSIS — D509 Iron deficiency anemia, unspecified: Secondary | ICD-10-CM | POA: Diagnosis not present

## 2020-04-18 DIAGNOSIS — N186 End stage renal disease: Secondary | ICD-10-CM | POA: Diagnosis not present

## 2020-04-18 DIAGNOSIS — Z992 Dependence on renal dialysis: Secondary | ICD-10-CM | POA: Diagnosis not present

## 2020-04-18 DIAGNOSIS — N2581 Secondary hyperparathyroidism of renal origin: Secondary | ICD-10-CM | POA: Diagnosis not present

## 2020-04-18 DIAGNOSIS — Z23 Encounter for immunization: Secondary | ICD-10-CM | POA: Diagnosis not present

## 2020-04-21 DIAGNOSIS — N2581 Secondary hyperparathyroidism of renal origin: Secondary | ICD-10-CM | POA: Diagnosis not present

## 2020-04-21 DIAGNOSIS — D509 Iron deficiency anemia, unspecified: Secondary | ICD-10-CM | POA: Diagnosis not present

## 2020-04-21 DIAGNOSIS — Z992 Dependence on renal dialysis: Secondary | ICD-10-CM | POA: Diagnosis not present

## 2020-04-21 DIAGNOSIS — Z23 Encounter for immunization: Secondary | ICD-10-CM | POA: Diagnosis not present

## 2020-04-21 DIAGNOSIS — N186 End stage renal disease: Secondary | ICD-10-CM | POA: Diagnosis not present

## 2020-04-23 DIAGNOSIS — N2581 Secondary hyperparathyroidism of renal origin: Secondary | ICD-10-CM | POA: Diagnosis not present

## 2020-04-23 DIAGNOSIS — Z992 Dependence on renal dialysis: Secondary | ICD-10-CM | POA: Diagnosis not present

## 2020-04-23 DIAGNOSIS — N186 End stage renal disease: Secondary | ICD-10-CM | POA: Diagnosis not present

## 2020-04-23 DIAGNOSIS — D509 Iron deficiency anemia, unspecified: Secondary | ICD-10-CM | POA: Diagnosis not present

## 2020-04-23 DIAGNOSIS — Z23 Encounter for immunization: Secondary | ICD-10-CM | POA: Diagnosis not present

## 2020-04-25 DIAGNOSIS — D509 Iron deficiency anemia, unspecified: Secondary | ICD-10-CM | POA: Diagnosis not present

## 2020-04-25 DIAGNOSIS — Z992 Dependence on renal dialysis: Secondary | ICD-10-CM | POA: Diagnosis not present

## 2020-04-25 DIAGNOSIS — N186 End stage renal disease: Secondary | ICD-10-CM | POA: Diagnosis not present

## 2020-04-25 DIAGNOSIS — Z23 Encounter for immunization: Secondary | ICD-10-CM | POA: Diagnosis not present

## 2020-04-25 DIAGNOSIS — N2581 Secondary hyperparathyroidism of renal origin: Secondary | ICD-10-CM | POA: Diagnosis not present

## 2020-04-28 DIAGNOSIS — Z23 Encounter for immunization: Secondary | ICD-10-CM | POA: Diagnosis not present

## 2020-04-28 DIAGNOSIS — N186 End stage renal disease: Secondary | ICD-10-CM | POA: Diagnosis not present

## 2020-04-28 DIAGNOSIS — Z992 Dependence on renal dialysis: Secondary | ICD-10-CM | POA: Diagnosis not present

## 2020-04-28 DIAGNOSIS — D509 Iron deficiency anemia, unspecified: Secondary | ICD-10-CM | POA: Diagnosis not present

## 2020-04-28 DIAGNOSIS — N2581 Secondary hyperparathyroidism of renal origin: Secondary | ICD-10-CM | POA: Diagnosis not present

## 2020-04-30 DIAGNOSIS — N2581 Secondary hyperparathyroidism of renal origin: Secondary | ICD-10-CM | POA: Diagnosis not present

## 2020-04-30 DIAGNOSIS — D509 Iron deficiency anemia, unspecified: Secondary | ICD-10-CM | POA: Diagnosis not present

## 2020-04-30 DIAGNOSIS — Z992 Dependence on renal dialysis: Secondary | ICD-10-CM | POA: Diagnosis not present

## 2020-04-30 DIAGNOSIS — Z23 Encounter for immunization: Secondary | ICD-10-CM | POA: Diagnosis not present

## 2020-04-30 DIAGNOSIS — N186 End stage renal disease: Secondary | ICD-10-CM | POA: Diagnosis not present

## 2020-05-02 DIAGNOSIS — Z23 Encounter for immunization: Secondary | ICD-10-CM | POA: Diagnosis not present

## 2020-05-02 DIAGNOSIS — D509 Iron deficiency anemia, unspecified: Secondary | ICD-10-CM | POA: Diagnosis not present

## 2020-05-02 DIAGNOSIS — N2581 Secondary hyperparathyroidism of renal origin: Secondary | ICD-10-CM | POA: Diagnosis not present

## 2020-05-02 DIAGNOSIS — N186 End stage renal disease: Secondary | ICD-10-CM | POA: Diagnosis not present

## 2020-05-02 DIAGNOSIS — Z992 Dependence on renal dialysis: Secondary | ICD-10-CM | POA: Diagnosis not present

## 2020-05-05 DIAGNOSIS — N186 End stage renal disease: Secondary | ICD-10-CM | POA: Diagnosis not present

## 2020-05-05 DIAGNOSIS — Z23 Encounter for immunization: Secondary | ICD-10-CM | POA: Diagnosis not present

## 2020-05-05 DIAGNOSIS — Z992 Dependence on renal dialysis: Secondary | ICD-10-CM | POA: Diagnosis not present

## 2020-05-05 DIAGNOSIS — D509 Iron deficiency anemia, unspecified: Secondary | ICD-10-CM | POA: Diagnosis not present

## 2020-05-05 DIAGNOSIS — N2581 Secondary hyperparathyroidism of renal origin: Secondary | ICD-10-CM | POA: Diagnosis not present

## 2020-05-07 DIAGNOSIS — Z992 Dependence on renal dialysis: Secondary | ICD-10-CM | POA: Diagnosis not present

## 2020-05-07 DIAGNOSIS — Z23 Encounter for immunization: Secondary | ICD-10-CM | POA: Diagnosis not present

## 2020-05-07 DIAGNOSIS — N186 End stage renal disease: Secondary | ICD-10-CM | POA: Diagnosis not present

## 2020-05-07 DIAGNOSIS — N2581 Secondary hyperparathyroidism of renal origin: Secondary | ICD-10-CM | POA: Diagnosis not present

## 2020-05-07 DIAGNOSIS — D509 Iron deficiency anemia, unspecified: Secondary | ICD-10-CM | POA: Diagnosis not present

## 2020-05-08 DIAGNOSIS — N186 End stage renal disease: Secondary | ICD-10-CM | POA: Diagnosis not present

## 2020-05-08 DIAGNOSIS — Z992 Dependence on renal dialysis: Secondary | ICD-10-CM | POA: Diagnosis not present

## 2020-05-08 DIAGNOSIS — E1122 Type 2 diabetes mellitus with diabetic chronic kidney disease: Secondary | ICD-10-CM | POA: Diagnosis not present

## 2020-05-09 DIAGNOSIS — D631 Anemia in chronic kidney disease: Secondary | ICD-10-CM | POA: Diagnosis not present

## 2020-05-09 DIAGNOSIS — Z992 Dependence on renal dialysis: Secondary | ICD-10-CM | POA: Diagnosis not present

## 2020-05-09 DIAGNOSIS — N2581 Secondary hyperparathyroidism of renal origin: Secondary | ICD-10-CM | POA: Diagnosis not present

## 2020-05-09 DIAGNOSIS — E8770 Fluid overload, unspecified: Secondary | ICD-10-CM | POA: Diagnosis not present

## 2020-05-09 DIAGNOSIS — D509 Iron deficiency anemia, unspecified: Secondary | ICD-10-CM | POA: Diagnosis not present

## 2020-05-09 DIAGNOSIS — N186 End stage renal disease: Secondary | ICD-10-CM | POA: Diagnosis not present

## 2020-05-09 DIAGNOSIS — E1129 Type 2 diabetes mellitus with other diabetic kidney complication: Secondary | ICD-10-CM | POA: Diagnosis not present

## 2020-05-12 DIAGNOSIS — E1129 Type 2 diabetes mellitus with other diabetic kidney complication: Secondary | ICD-10-CM | POA: Diagnosis not present

## 2020-05-12 DIAGNOSIS — D631 Anemia in chronic kidney disease: Secondary | ICD-10-CM | POA: Diagnosis not present

## 2020-05-12 DIAGNOSIS — D509 Iron deficiency anemia, unspecified: Secondary | ICD-10-CM | POA: Diagnosis not present

## 2020-05-12 DIAGNOSIS — N2581 Secondary hyperparathyroidism of renal origin: Secondary | ICD-10-CM | POA: Diagnosis not present

## 2020-05-12 DIAGNOSIS — N186 End stage renal disease: Secondary | ICD-10-CM | POA: Diagnosis not present

## 2020-05-12 DIAGNOSIS — Z992 Dependence on renal dialysis: Secondary | ICD-10-CM | POA: Diagnosis not present

## 2020-05-14 DIAGNOSIS — E1129 Type 2 diabetes mellitus with other diabetic kidney complication: Secondary | ICD-10-CM | POA: Diagnosis not present

## 2020-05-14 DIAGNOSIS — Z992 Dependence on renal dialysis: Secondary | ICD-10-CM | POA: Diagnosis not present

## 2020-05-14 DIAGNOSIS — N2581 Secondary hyperparathyroidism of renal origin: Secondary | ICD-10-CM | POA: Diagnosis not present

## 2020-05-14 DIAGNOSIS — N186 End stage renal disease: Secondary | ICD-10-CM | POA: Diagnosis not present

## 2020-05-14 DIAGNOSIS — D631 Anemia in chronic kidney disease: Secondary | ICD-10-CM | POA: Diagnosis not present

## 2020-05-14 DIAGNOSIS — D509 Iron deficiency anemia, unspecified: Secondary | ICD-10-CM | POA: Diagnosis not present

## 2020-05-16 DIAGNOSIS — N186 End stage renal disease: Secondary | ICD-10-CM | POA: Diagnosis not present

## 2020-05-16 DIAGNOSIS — N2581 Secondary hyperparathyroidism of renal origin: Secondary | ICD-10-CM | POA: Diagnosis not present

## 2020-05-16 DIAGNOSIS — Z992 Dependence on renal dialysis: Secondary | ICD-10-CM | POA: Diagnosis not present

## 2020-05-16 DIAGNOSIS — D631 Anemia in chronic kidney disease: Secondary | ICD-10-CM | POA: Diagnosis not present

## 2020-05-16 DIAGNOSIS — D509 Iron deficiency anemia, unspecified: Secondary | ICD-10-CM | POA: Diagnosis not present

## 2020-05-16 DIAGNOSIS — E1129 Type 2 diabetes mellitus with other diabetic kidney complication: Secondary | ICD-10-CM | POA: Diagnosis not present

## 2020-05-19 DIAGNOSIS — D631 Anemia in chronic kidney disease: Secondary | ICD-10-CM | POA: Diagnosis not present

## 2020-05-19 DIAGNOSIS — Z992 Dependence on renal dialysis: Secondary | ICD-10-CM | POA: Diagnosis not present

## 2020-05-19 DIAGNOSIS — E1129 Type 2 diabetes mellitus with other diabetic kidney complication: Secondary | ICD-10-CM | POA: Diagnosis not present

## 2020-05-19 DIAGNOSIS — N186 End stage renal disease: Secondary | ICD-10-CM | POA: Diagnosis not present

## 2020-05-19 DIAGNOSIS — D509 Iron deficiency anemia, unspecified: Secondary | ICD-10-CM | POA: Diagnosis not present

## 2020-05-19 DIAGNOSIS — N2581 Secondary hyperparathyroidism of renal origin: Secondary | ICD-10-CM | POA: Diagnosis not present

## 2020-05-21 DIAGNOSIS — N2581 Secondary hyperparathyroidism of renal origin: Secondary | ICD-10-CM | POA: Diagnosis not present

## 2020-05-21 DIAGNOSIS — E1129 Type 2 diabetes mellitus with other diabetic kidney complication: Secondary | ICD-10-CM | POA: Diagnosis not present

## 2020-05-21 DIAGNOSIS — N186 End stage renal disease: Secondary | ICD-10-CM | POA: Diagnosis not present

## 2020-05-21 DIAGNOSIS — Z992 Dependence on renal dialysis: Secondary | ICD-10-CM | POA: Diagnosis not present

## 2020-05-21 DIAGNOSIS — D631 Anemia in chronic kidney disease: Secondary | ICD-10-CM | POA: Diagnosis not present

## 2020-05-21 DIAGNOSIS — D509 Iron deficiency anemia, unspecified: Secondary | ICD-10-CM | POA: Diagnosis not present

## 2020-05-23 DIAGNOSIS — Z992 Dependence on renal dialysis: Secondary | ICD-10-CM | POA: Diagnosis not present

## 2020-05-23 DIAGNOSIS — D631 Anemia in chronic kidney disease: Secondary | ICD-10-CM | POA: Diagnosis not present

## 2020-05-23 DIAGNOSIS — E1129 Type 2 diabetes mellitus with other diabetic kidney complication: Secondary | ICD-10-CM | POA: Diagnosis not present

## 2020-05-23 DIAGNOSIS — N186 End stage renal disease: Secondary | ICD-10-CM | POA: Diagnosis not present

## 2020-05-23 DIAGNOSIS — N2581 Secondary hyperparathyroidism of renal origin: Secondary | ICD-10-CM | POA: Diagnosis not present

## 2020-05-23 DIAGNOSIS — D509 Iron deficiency anemia, unspecified: Secondary | ICD-10-CM | POA: Diagnosis not present

## 2020-05-26 DIAGNOSIS — Z992 Dependence on renal dialysis: Secondary | ICD-10-CM | POA: Diagnosis not present

## 2020-05-26 DIAGNOSIS — E1129 Type 2 diabetes mellitus with other diabetic kidney complication: Secondary | ICD-10-CM | POA: Diagnosis not present

## 2020-05-26 DIAGNOSIS — D631 Anemia in chronic kidney disease: Secondary | ICD-10-CM | POA: Diagnosis not present

## 2020-05-26 DIAGNOSIS — N186 End stage renal disease: Secondary | ICD-10-CM | POA: Diagnosis not present

## 2020-05-26 DIAGNOSIS — D509 Iron deficiency anemia, unspecified: Secondary | ICD-10-CM | POA: Diagnosis not present

## 2020-05-26 DIAGNOSIS — N2581 Secondary hyperparathyroidism of renal origin: Secondary | ICD-10-CM | POA: Diagnosis not present

## 2020-05-27 DIAGNOSIS — N2581 Secondary hyperparathyroidism of renal origin: Secondary | ICD-10-CM | POA: Diagnosis not present

## 2020-05-27 DIAGNOSIS — Z992 Dependence on renal dialysis: Secondary | ICD-10-CM | POA: Diagnosis not present

## 2020-05-27 DIAGNOSIS — D631 Anemia in chronic kidney disease: Secondary | ICD-10-CM | POA: Diagnosis not present

## 2020-05-27 DIAGNOSIS — D509 Iron deficiency anemia, unspecified: Secondary | ICD-10-CM | POA: Diagnosis not present

## 2020-05-27 DIAGNOSIS — N186 End stage renal disease: Secondary | ICD-10-CM | POA: Diagnosis not present

## 2020-05-27 DIAGNOSIS — E1129 Type 2 diabetes mellitus with other diabetic kidney complication: Secondary | ICD-10-CM | POA: Diagnosis not present

## 2020-05-28 DIAGNOSIS — N2581 Secondary hyperparathyroidism of renal origin: Secondary | ICD-10-CM | POA: Diagnosis not present

## 2020-05-28 DIAGNOSIS — N186 End stage renal disease: Secondary | ICD-10-CM | POA: Diagnosis not present

## 2020-05-28 DIAGNOSIS — D631 Anemia in chronic kidney disease: Secondary | ICD-10-CM | POA: Diagnosis not present

## 2020-05-28 DIAGNOSIS — D509 Iron deficiency anemia, unspecified: Secondary | ICD-10-CM | POA: Diagnosis not present

## 2020-05-28 DIAGNOSIS — E1129 Type 2 diabetes mellitus with other diabetic kidney complication: Secondary | ICD-10-CM | POA: Diagnosis not present

## 2020-05-28 DIAGNOSIS — Z992 Dependence on renal dialysis: Secondary | ICD-10-CM | POA: Diagnosis not present

## 2020-05-30 DIAGNOSIS — N186 End stage renal disease: Secondary | ICD-10-CM | POA: Diagnosis not present

## 2020-05-30 DIAGNOSIS — D631 Anemia in chronic kidney disease: Secondary | ICD-10-CM | POA: Diagnosis not present

## 2020-05-30 DIAGNOSIS — D509 Iron deficiency anemia, unspecified: Secondary | ICD-10-CM | POA: Diagnosis not present

## 2020-05-30 DIAGNOSIS — E1129 Type 2 diabetes mellitus with other diabetic kidney complication: Secondary | ICD-10-CM | POA: Diagnosis not present

## 2020-05-30 DIAGNOSIS — Z992 Dependence on renal dialysis: Secondary | ICD-10-CM | POA: Diagnosis not present

## 2020-05-30 DIAGNOSIS — N2581 Secondary hyperparathyroidism of renal origin: Secondary | ICD-10-CM | POA: Diagnosis not present

## 2020-06-01 DIAGNOSIS — E785 Hyperlipidemia, unspecified: Secondary | ICD-10-CM | POA: Diagnosis not present

## 2020-06-01 DIAGNOSIS — Z0001 Encounter for general adult medical examination with abnormal findings: Secondary | ICD-10-CM | POA: Diagnosis not present

## 2020-06-01 DIAGNOSIS — W57XXXD Bitten or stung by nonvenomous insect and other nonvenomous arthropods, subsequent encounter: Secondary | ICD-10-CM | POA: Diagnosis not present

## 2020-06-01 DIAGNOSIS — Z1389 Encounter for screening for other disorder: Secondary | ICD-10-CM | POA: Diagnosis not present

## 2020-06-01 DIAGNOSIS — Z720 Tobacco use: Secondary | ICD-10-CM | POA: Diagnosis not present

## 2020-06-01 DIAGNOSIS — E119 Type 2 diabetes mellitus without complications: Secondary | ICD-10-CM | POA: Diagnosis not present

## 2020-06-01 DIAGNOSIS — N186 End stage renal disease: Secondary | ICD-10-CM | POA: Diagnosis not present

## 2020-06-01 DIAGNOSIS — I10 Essential (primary) hypertension: Secondary | ICD-10-CM | POA: Diagnosis not present

## 2020-06-01 DIAGNOSIS — Z992 Dependence on renal dialysis: Secondary | ICD-10-CM | POA: Diagnosis not present

## 2020-06-02 DIAGNOSIS — D509 Iron deficiency anemia, unspecified: Secondary | ICD-10-CM | POA: Diagnosis not present

## 2020-06-02 DIAGNOSIS — N2581 Secondary hyperparathyroidism of renal origin: Secondary | ICD-10-CM | POA: Diagnosis not present

## 2020-06-02 DIAGNOSIS — Z992 Dependence on renal dialysis: Secondary | ICD-10-CM | POA: Diagnosis not present

## 2020-06-02 DIAGNOSIS — D631 Anemia in chronic kidney disease: Secondary | ICD-10-CM | POA: Diagnosis not present

## 2020-06-02 DIAGNOSIS — E1129 Type 2 diabetes mellitus with other diabetic kidney complication: Secondary | ICD-10-CM | POA: Diagnosis not present

## 2020-06-02 DIAGNOSIS — N186 End stage renal disease: Secondary | ICD-10-CM | POA: Diagnosis not present

## 2020-06-04 DIAGNOSIS — N2581 Secondary hyperparathyroidism of renal origin: Secondary | ICD-10-CM | POA: Diagnosis not present

## 2020-06-04 DIAGNOSIS — N186 End stage renal disease: Secondary | ICD-10-CM | POA: Diagnosis not present

## 2020-06-04 DIAGNOSIS — D631 Anemia in chronic kidney disease: Secondary | ICD-10-CM | POA: Diagnosis not present

## 2020-06-04 DIAGNOSIS — E1129 Type 2 diabetes mellitus with other diabetic kidney complication: Secondary | ICD-10-CM | POA: Diagnosis not present

## 2020-06-04 DIAGNOSIS — D509 Iron deficiency anemia, unspecified: Secondary | ICD-10-CM | POA: Diagnosis not present

## 2020-06-04 DIAGNOSIS — Z992 Dependence on renal dialysis: Secondary | ICD-10-CM | POA: Diagnosis not present

## 2020-06-06 DIAGNOSIS — N2581 Secondary hyperparathyroidism of renal origin: Secondary | ICD-10-CM | POA: Diagnosis not present

## 2020-06-06 DIAGNOSIS — Z992 Dependence on renal dialysis: Secondary | ICD-10-CM | POA: Diagnosis not present

## 2020-06-06 DIAGNOSIS — D509 Iron deficiency anemia, unspecified: Secondary | ICD-10-CM | POA: Diagnosis not present

## 2020-06-06 DIAGNOSIS — D631 Anemia in chronic kidney disease: Secondary | ICD-10-CM | POA: Diagnosis not present

## 2020-06-06 DIAGNOSIS — N186 End stage renal disease: Secondary | ICD-10-CM | POA: Diagnosis not present

## 2020-06-06 DIAGNOSIS — E1129 Type 2 diabetes mellitus with other diabetic kidney complication: Secondary | ICD-10-CM | POA: Diagnosis not present

## 2020-06-08 DIAGNOSIS — Z992 Dependence on renal dialysis: Secondary | ICD-10-CM | POA: Diagnosis not present

## 2020-06-08 DIAGNOSIS — E1122 Type 2 diabetes mellitus with diabetic chronic kidney disease: Secondary | ICD-10-CM | POA: Diagnosis not present

## 2020-06-08 DIAGNOSIS — N186 End stage renal disease: Secondary | ICD-10-CM | POA: Diagnosis not present

## 2020-06-09 DIAGNOSIS — D509 Iron deficiency anemia, unspecified: Secondary | ICD-10-CM | POA: Diagnosis not present

## 2020-06-09 DIAGNOSIS — N2581 Secondary hyperparathyroidism of renal origin: Secondary | ICD-10-CM | POA: Diagnosis not present

## 2020-06-09 DIAGNOSIS — Z992 Dependence on renal dialysis: Secondary | ICD-10-CM | POA: Diagnosis not present

## 2020-06-09 DIAGNOSIS — D631 Anemia in chronic kidney disease: Secondary | ICD-10-CM | POA: Diagnosis not present

## 2020-06-09 DIAGNOSIS — N186 End stage renal disease: Secondary | ICD-10-CM | POA: Diagnosis not present

## 2020-06-11 DIAGNOSIS — Z992 Dependence on renal dialysis: Secondary | ICD-10-CM | POA: Diagnosis not present

## 2020-06-11 DIAGNOSIS — D509 Iron deficiency anemia, unspecified: Secondary | ICD-10-CM | POA: Diagnosis not present

## 2020-06-11 DIAGNOSIS — N2581 Secondary hyperparathyroidism of renal origin: Secondary | ICD-10-CM | POA: Diagnosis not present

## 2020-06-11 DIAGNOSIS — D631 Anemia in chronic kidney disease: Secondary | ICD-10-CM | POA: Diagnosis not present

## 2020-06-11 DIAGNOSIS — N186 End stage renal disease: Secondary | ICD-10-CM | POA: Diagnosis not present

## 2020-06-13 DIAGNOSIS — N2581 Secondary hyperparathyroidism of renal origin: Secondary | ICD-10-CM | POA: Diagnosis not present

## 2020-06-13 DIAGNOSIS — Z992 Dependence on renal dialysis: Secondary | ICD-10-CM | POA: Diagnosis not present

## 2020-06-13 DIAGNOSIS — N186 End stage renal disease: Secondary | ICD-10-CM | POA: Diagnosis not present

## 2020-06-13 DIAGNOSIS — D509 Iron deficiency anemia, unspecified: Secondary | ICD-10-CM | POA: Diagnosis not present

## 2020-06-13 DIAGNOSIS — D631 Anemia in chronic kidney disease: Secondary | ICD-10-CM | POA: Diagnosis not present

## 2020-06-18 DIAGNOSIS — Z992 Dependence on renal dialysis: Secondary | ICD-10-CM | POA: Diagnosis not present

## 2020-06-18 DIAGNOSIS — D509 Iron deficiency anemia, unspecified: Secondary | ICD-10-CM | POA: Diagnosis not present

## 2020-06-18 DIAGNOSIS — N2581 Secondary hyperparathyroidism of renal origin: Secondary | ICD-10-CM | POA: Diagnosis not present

## 2020-06-18 DIAGNOSIS — D631 Anemia in chronic kidney disease: Secondary | ICD-10-CM | POA: Diagnosis not present

## 2020-06-18 DIAGNOSIS — N186 End stage renal disease: Secondary | ICD-10-CM | POA: Diagnosis not present

## 2020-06-20 DIAGNOSIS — D509 Iron deficiency anemia, unspecified: Secondary | ICD-10-CM | POA: Diagnosis not present

## 2020-06-20 DIAGNOSIS — N186 End stage renal disease: Secondary | ICD-10-CM | POA: Diagnosis not present

## 2020-06-20 DIAGNOSIS — Z992 Dependence on renal dialysis: Secondary | ICD-10-CM | POA: Diagnosis not present

## 2020-06-20 DIAGNOSIS — D631 Anemia in chronic kidney disease: Secondary | ICD-10-CM | POA: Diagnosis not present

## 2020-06-20 DIAGNOSIS — N2581 Secondary hyperparathyroidism of renal origin: Secondary | ICD-10-CM | POA: Diagnosis not present

## 2020-06-23 DIAGNOSIS — D631 Anemia in chronic kidney disease: Secondary | ICD-10-CM | POA: Diagnosis not present

## 2020-06-23 DIAGNOSIS — N186 End stage renal disease: Secondary | ICD-10-CM | POA: Diagnosis not present

## 2020-06-23 DIAGNOSIS — Z992 Dependence on renal dialysis: Secondary | ICD-10-CM | POA: Diagnosis not present

## 2020-06-23 DIAGNOSIS — N2581 Secondary hyperparathyroidism of renal origin: Secondary | ICD-10-CM | POA: Diagnosis not present

## 2020-06-23 DIAGNOSIS — D509 Iron deficiency anemia, unspecified: Secondary | ICD-10-CM | POA: Diagnosis not present

## 2020-06-25 DIAGNOSIS — Z992 Dependence on renal dialysis: Secondary | ICD-10-CM | POA: Diagnosis not present

## 2020-06-25 DIAGNOSIS — N186 End stage renal disease: Secondary | ICD-10-CM | POA: Diagnosis not present

## 2020-06-25 DIAGNOSIS — N2581 Secondary hyperparathyroidism of renal origin: Secondary | ICD-10-CM | POA: Diagnosis not present

## 2020-06-25 DIAGNOSIS — D509 Iron deficiency anemia, unspecified: Secondary | ICD-10-CM | POA: Diagnosis not present

## 2020-06-25 DIAGNOSIS — D631 Anemia in chronic kidney disease: Secondary | ICD-10-CM | POA: Diagnosis not present

## 2020-06-27 DIAGNOSIS — N186 End stage renal disease: Secondary | ICD-10-CM | POA: Diagnosis not present

## 2020-06-27 DIAGNOSIS — D509 Iron deficiency anemia, unspecified: Secondary | ICD-10-CM | POA: Diagnosis not present

## 2020-06-27 DIAGNOSIS — N2581 Secondary hyperparathyroidism of renal origin: Secondary | ICD-10-CM | POA: Diagnosis not present

## 2020-06-27 DIAGNOSIS — Z992 Dependence on renal dialysis: Secondary | ICD-10-CM | POA: Diagnosis not present

## 2020-06-27 DIAGNOSIS — D631 Anemia in chronic kidney disease: Secondary | ICD-10-CM | POA: Diagnosis not present

## 2020-07-01 DIAGNOSIS — D509 Iron deficiency anemia, unspecified: Secondary | ICD-10-CM | POA: Diagnosis not present

## 2020-07-01 DIAGNOSIS — N2581 Secondary hyperparathyroidism of renal origin: Secondary | ICD-10-CM | POA: Diagnosis not present

## 2020-07-01 DIAGNOSIS — D631 Anemia in chronic kidney disease: Secondary | ICD-10-CM | POA: Diagnosis not present

## 2020-07-01 DIAGNOSIS — N186 End stage renal disease: Secondary | ICD-10-CM | POA: Diagnosis not present

## 2020-07-01 DIAGNOSIS — Z992 Dependence on renal dialysis: Secondary | ICD-10-CM | POA: Diagnosis not present

## 2020-07-04 DIAGNOSIS — N186 End stage renal disease: Secondary | ICD-10-CM | POA: Diagnosis not present

## 2020-07-04 DIAGNOSIS — D509 Iron deficiency anemia, unspecified: Secondary | ICD-10-CM | POA: Diagnosis not present

## 2020-07-04 DIAGNOSIS — Z992 Dependence on renal dialysis: Secondary | ICD-10-CM | POA: Diagnosis not present

## 2020-07-04 DIAGNOSIS — N2581 Secondary hyperparathyroidism of renal origin: Secondary | ICD-10-CM | POA: Diagnosis not present

## 2020-07-04 DIAGNOSIS — D631 Anemia in chronic kidney disease: Secondary | ICD-10-CM | POA: Diagnosis not present

## 2020-07-07 DIAGNOSIS — N2581 Secondary hyperparathyroidism of renal origin: Secondary | ICD-10-CM | POA: Diagnosis not present

## 2020-07-07 DIAGNOSIS — D631 Anemia in chronic kidney disease: Secondary | ICD-10-CM | POA: Diagnosis not present

## 2020-07-07 DIAGNOSIS — Z992 Dependence on renal dialysis: Secondary | ICD-10-CM | POA: Diagnosis not present

## 2020-07-07 DIAGNOSIS — N186 End stage renal disease: Secondary | ICD-10-CM | POA: Diagnosis not present

## 2020-07-07 DIAGNOSIS — D509 Iron deficiency anemia, unspecified: Secondary | ICD-10-CM | POA: Diagnosis not present

## 2020-07-08 DIAGNOSIS — Z992 Dependence on renal dialysis: Secondary | ICD-10-CM | POA: Diagnosis not present

## 2020-07-08 DIAGNOSIS — N186 End stage renal disease: Secondary | ICD-10-CM | POA: Diagnosis not present

## 2020-07-08 DIAGNOSIS — E1122 Type 2 diabetes mellitus with diabetic chronic kidney disease: Secondary | ICD-10-CM | POA: Diagnosis not present

## 2020-07-09 DIAGNOSIS — D631 Anemia in chronic kidney disease: Secondary | ICD-10-CM | POA: Diagnosis not present

## 2020-07-09 DIAGNOSIS — D509 Iron deficiency anemia, unspecified: Secondary | ICD-10-CM | POA: Diagnosis not present

## 2020-07-09 DIAGNOSIS — N186 End stage renal disease: Secondary | ICD-10-CM | POA: Diagnosis not present

## 2020-07-09 DIAGNOSIS — Z992 Dependence on renal dialysis: Secondary | ICD-10-CM | POA: Diagnosis not present

## 2020-07-09 DIAGNOSIS — N2581 Secondary hyperparathyroidism of renal origin: Secondary | ICD-10-CM | POA: Diagnosis not present

## 2020-07-11 DIAGNOSIS — D509 Iron deficiency anemia, unspecified: Secondary | ICD-10-CM | POA: Diagnosis not present

## 2020-07-11 DIAGNOSIS — Z992 Dependence on renal dialysis: Secondary | ICD-10-CM | POA: Diagnosis not present

## 2020-07-11 DIAGNOSIS — N186 End stage renal disease: Secondary | ICD-10-CM | POA: Diagnosis not present

## 2020-07-11 DIAGNOSIS — D631 Anemia in chronic kidney disease: Secondary | ICD-10-CM | POA: Diagnosis not present

## 2020-07-11 DIAGNOSIS — N2581 Secondary hyperparathyroidism of renal origin: Secondary | ICD-10-CM | POA: Diagnosis not present

## 2020-07-14 DIAGNOSIS — D509 Iron deficiency anemia, unspecified: Secondary | ICD-10-CM | POA: Diagnosis not present

## 2020-07-14 DIAGNOSIS — N186 End stage renal disease: Secondary | ICD-10-CM | POA: Diagnosis not present

## 2020-07-14 DIAGNOSIS — N2581 Secondary hyperparathyroidism of renal origin: Secondary | ICD-10-CM | POA: Diagnosis not present

## 2020-07-14 DIAGNOSIS — D631 Anemia in chronic kidney disease: Secondary | ICD-10-CM | POA: Diagnosis not present

## 2020-07-14 DIAGNOSIS — Z992 Dependence on renal dialysis: Secondary | ICD-10-CM | POA: Diagnosis not present

## 2020-07-16 DIAGNOSIS — D509 Iron deficiency anemia, unspecified: Secondary | ICD-10-CM | POA: Diagnosis not present

## 2020-07-16 DIAGNOSIS — Z992 Dependence on renal dialysis: Secondary | ICD-10-CM | POA: Diagnosis not present

## 2020-07-16 DIAGNOSIS — D631 Anemia in chronic kidney disease: Secondary | ICD-10-CM | POA: Diagnosis not present

## 2020-07-16 DIAGNOSIS — N2581 Secondary hyperparathyroidism of renal origin: Secondary | ICD-10-CM | POA: Diagnosis not present

## 2020-07-16 DIAGNOSIS — N186 End stage renal disease: Secondary | ICD-10-CM | POA: Diagnosis not present

## 2020-07-18 DIAGNOSIS — D631 Anemia in chronic kidney disease: Secondary | ICD-10-CM | POA: Diagnosis not present

## 2020-07-18 DIAGNOSIS — N2581 Secondary hyperparathyroidism of renal origin: Secondary | ICD-10-CM | POA: Diagnosis not present

## 2020-07-18 DIAGNOSIS — N186 End stage renal disease: Secondary | ICD-10-CM | POA: Diagnosis not present

## 2020-07-18 DIAGNOSIS — D509 Iron deficiency anemia, unspecified: Secondary | ICD-10-CM | POA: Diagnosis not present

## 2020-07-18 DIAGNOSIS — Z992 Dependence on renal dialysis: Secondary | ICD-10-CM | POA: Diagnosis not present

## 2020-07-21 DIAGNOSIS — D631 Anemia in chronic kidney disease: Secondary | ICD-10-CM | POA: Diagnosis not present

## 2020-07-21 DIAGNOSIS — N2581 Secondary hyperparathyroidism of renal origin: Secondary | ICD-10-CM | POA: Diagnosis not present

## 2020-07-21 DIAGNOSIS — N186 End stage renal disease: Secondary | ICD-10-CM | POA: Diagnosis not present

## 2020-07-21 DIAGNOSIS — Z992 Dependence on renal dialysis: Secondary | ICD-10-CM | POA: Diagnosis not present

## 2020-07-21 DIAGNOSIS — D509 Iron deficiency anemia, unspecified: Secondary | ICD-10-CM | POA: Diagnosis not present

## 2020-07-23 DIAGNOSIS — N186 End stage renal disease: Secondary | ICD-10-CM | POA: Diagnosis not present

## 2020-07-23 DIAGNOSIS — Z992 Dependence on renal dialysis: Secondary | ICD-10-CM | POA: Diagnosis not present

## 2020-07-23 DIAGNOSIS — N2581 Secondary hyperparathyroidism of renal origin: Secondary | ICD-10-CM | POA: Diagnosis not present

## 2020-07-23 DIAGNOSIS — D509 Iron deficiency anemia, unspecified: Secondary | ICD-10-CM | POA: Diagnosis not present

## 2020-07-23 DIAGNOSIS — D631 Anemia in chronic kidney disease: Secondary | ICD-10-CM | POA: Diagnosis not present

## 2020-07-25 DIAGNOSIS — D631 Anemia in chronic kidney disease: Secondary | ICD-10-CM | POA: Diagnosis not present

## 2020-07-25 DIAGNOSIS — D509 Iron deficiency anemia, unspecified: Secondary | ICD-10-CM | POA: Diagnosis not present

## 2020-07-25 DIAGNOSIS — N2581 Secondary hyperparathyroidism of renal origin: Secondary | ICD-10-CM | POA: Diagnosis not present

## 2020-07-25 DIAGNOSIS — N186 End stage renal disease: Secondary | ICD-10-CM | POA: Diagnosis not present

## 2020-07-25 DIAGNOSIS — Z992 Dependence on renal dialysis: Secondary | ICD-10-CM | POA: Diagnosis not present

## 2020-07-28 DIAGNOSIS — D631 Anemia in chronic kidney disease: Secondary | ICD-10-CM | POA: Diagnosis not present

## 2020-07-28 DIAGNOSIS — N2581 Secondary hyperparathyroidism of renal origin: Secondary | ICD-10-CM | POA: Diagnosis not present

## 2020-07-28 DIAGNOSIS — N186 End stage renal disease: Secondary | ICD-10-CM | POA: Diagnosis not present

## 2020-07-28 DIAGNOSIS — Z992 Dependence on renal dialysis: Secondary | ICD-10-CM | POA: Diagnosis not present

## 2020-07-28 DIAGNOSIS — D509 Iron deficiency anemia, unspecified: Secondary | ICD-10-CM | POA: Diagnosis not present

## 2020-07-30 DIAGNOSIS — D631 Anemia in chronic kidney disease: Secondary | ICD-10-CM | POA: Diagnosis not present

## 2020-07-30 DIAGNOSIS — N2581 Secondary hyperparathyroidism of renal origin: Secondary | ICD-10-CM | POA: Diagnosis not present

## 2020-07-30 DIAGNOSIS — D509 Iron deficiency anemia, unspecified: Secondary | ICD-10-CM | POA: Diagnosis not present

## 2020-07-30 DIAGNOSIS — Z992 Dependence on renal dialysis: Secondary | ICD-10-CM | POA: Diagnosis not present

## 2020-07-30 DIAGNOSIS — N186 End stage renal disease: Secondary | ICD-10-CM | POA: Diagnosis not present

## 2020-08-02 DIAGNOSIS — N2581 Secondary hyperparathyroidism of renal origin: Secondary | ICD-10-CM | POA: Diagnosis not present

## 2020-08-02 DIAGNOSIS — N186 End stage renal disease: Secondary | ICD-10-CM | POA: Diagnosis not present

## 2020-08-02 DIAGNOSIS — D631 Anemia in chronic kidney disease: Secondary | ICD-10-CM | POA: Diagnosis not present

## 2020-08-02 DIAGNOSIS — Z992 Dependence on renal dialysis: Secondary | ICD-10-CM | POA: Diagnosis not present

## 2020-08-02 DIAGNOSIS — D509 Iron deficiency anemia, unspecified: Secondary | ICD-10-CM | POA: Diagnosis not present

## 2020-08-04 DIAGNOSIS — N2581 Secondary hyperparathyroidism of renal origin: Secondary | ICD-10-CM | POA: Diagnosis not present

## 2020-08-04 DIAGNOSIS — D631 Anemia in chronic kidney disease: Secondary | ICD-10-CM | POA: Diagnosis not present

## 2020-08-04 DIAGNOSIS — Z992 Dependence on renal dialysis: Secondary | ICD-10-CM | POA: Diagnosis not present

## 2020-08-04 DIAGNOSIS — D509 Iron deficiency anemia, unspecified: Secondary | ICD-10-CM | POA: Diagnosis not present

## 2020-08-04 DIAGNOSIS — N186 End stage renal disease: Secondary | ICD-10-CM | POA: Diagnosis not present

## 2020-08-06 DIAGNOSIS — D631 Anemia in chronic kidney disease: Secondary | ICD-10-CM | POA: Diagnosis not present

## 2020-08-06 DIAGNOSIS — D509 Iron deficiency anemia, unspecified: Secondary | ICD-10-CM | POA: Diagnosis not present

## 2020-08-06 DIAGNOSIS — Z992 Dependence on renal dialysis: Secondary | ICD-10-CM | POA: Diagnosis not present

## 2020-08-06 DIAGNOSIS — N2581 Secondary hyperparathyroidism of renal origin: Secondary | ICD-10-CM | POA: Diagnosis not present

## 2020-08-06 DIAGNOSIS — N186 End stage renal disease: Secondary | ICD-10-CM | POA: Diagnosis not present

## 2020-08-08 DIAGNOSIS — E1122 Type 2 diabetes mellitus with diabetic chronic kidney disease: Secondary | ICD-10-CM | POA: Diagnosis not present

## 2020-08-08 DIAGNOSIS — Z992 Dependence on renal dialysis: Secondary | ICD-10-CM | POA: Diagnosis not present

## 2020-08-08 DIAGNOSIS — N186 End stage renal disease: Secondary | ICD-10-CM | POA: Diagnosis not present

## 2020-08-09 DIAGNOSIS — Z992 Dependence on renal dialysis: Secondary | ICD-10-CM | POA: Diagnosis not present

## 2020-08-09 DIAGNOSIS — N2581 Secondary hyperparathyroidism of renal origin: Secondary | ICD-10-CM | POA: Diagnosis not present

## 2020-08-09 DIAGNOSIS — N186 End stage renal disease: Secondary | ICD-10-CM | POA: Diagnosis not present

## 2020-08-11 DIAGNOSIS — N186 End stage renal disease: Secondary | ICD-10-CM | POA: Diagnosis not present

## 2020-08-11 DIAGNOSIS — Z992 Dependence on renal dialysis: Secondary | ICD-10-CM | POA: Diagnosis not present

## 2020-08-11 DIAGNOSIS — N2581 Secondary hyperparathyroidism of renal origin: Secondary | ICD-10-CM | POA: Diagnosis not present

## 2020-08-13 DIAGNOSIS — N2581 Secondary hyperparathyroidism of renal origin: Secondary | ICD-10-CM | POA: Diagnosis not present

## 2020-08-13 DIAGNOSIS — Z992 Dependence on renal dialysis: Secondary | ICD-10-CM | POA: Diagnosis not present

## 2020-08-13 DIAGNOSIS — N186 End stage renal disease: Secondary | ICD-10-CM | POA: Diagnosis not present

## 2020-08-15 DIAGNOSIS — N186 End stage renal disease: Secondary | ICD-10-CM | POA: Diagnosis not present

## 2020-08-15 DIAGNOSIS — Z992 Dependence on renal dialysis: Secondary | ICD-10-CM | POA: Diagnosis not present

## 2020-08-15 DIAGNOSIS — N2581 Secondary hyperparathyroidism of renal origin: Secondary | ICD-10-CM | POA: Diagnosis not present

## 2020-08-18 DIAGNOSIS — N186 End stage renal disease: Secondary | ICD-10-CM | POA: Diagnosis not present

## 2020-08-18 DIAGNOSIS — Z992 Dependence on renal dialysis: Secondary | ICD-10-CM | POA: Diagnosis not present

## 2020-08-18 DIAGNOSIS — N2581 Secondary hyperparathyroidism of renal origin: Secondary | ICD-10-CM | POA: Diagnosis not present

## 2020-08-20 DIAGNOSIS — N186 End stage renal disease: Secondary | ICD-10-CM | POA: Diagnosis not present

## 2020-08-20 DIAGNOSIS — N2581 Secondary hyperparathyroidism of renal origin: Secondary | ICD-10-CM | POA: Diagnosis not present

## 2020-08-20 DIAGNOSIS — Z992 Dependence on renal dialysis: Secondary | ICD-10-CM | POA: Diagnosis not present

## 2020-08-22 DIAGNOSIS — N186 End stage renal disease: Secondary | ICD-10-CM | POA: Diagnosis not present

## 2020-08-22 DIAGNOSIS — Z992 Dependence on renal dialysis: Secondary | ICD-10-CM | POA: Diagnosis not present

## 2020-08-22 DIAGNOSIS — N2581 Secondary hyperparathyroidism of renal origin: Secondary | ICD-10-CM | POA: Diagnosis not present

## 2020-08-27 DIAGNOSIS — N186 End stage renal disease: Secondary | ICD-10-CM | POA: Diagnosis not present

## 2020-08-27 DIAGNOSIS — Z992 Dependence on renal dialysis: Secondary | ICD-10-CM | POA: Diagnosis not present

## 2020-08-27 DIAGNOSIS — N2581 Secondary hyperparathyroidism of renal origin: Secondary | ICD-10-CM | POA: Diagnosis not present

## 2020-08-31 DIAGNOSIS — L97511 Non-pressure chronic ulcer of other part of right foot limited to breakdown of skin: Secondary | ICD-10-CM | POA: Diagnosis not present

## 2020-08-31 DIAGNOSIS — L97512 Non-pressure chronic ulcer of other part of right foot with fat layer exposed: Secondary | ICD-10-CM | POA: Diagnosis not present

## 2020-09-03 DIAGNOSIS — N186 End stage renal disease: Secondary | ICD-10-CM | POA: Diagnosis not present

## 2020-09-03 DIAGNOSIS — Z992 Dependence on renal dialysis: Secondary | ICD-10-CM | POA: Diagnosis not present

## 2020-09-03 DIAGNOSIS — N2581 Secondary hyperparathyroidism of renal origin: Secondary | ICD-10-CM | POA: Diagnosis not present

## 2020-09-08 DIAGNOSIS — E1122 Type 2 diabetes mellitus with diabetic chronic kidney disease: Secondary | ICD-10-CM | POA: Diagnosis not present

## 2020-09-08 DIAGNOSIS — N2581 Secondary hyperparathyroidism of renal origin: Secondary | ICD-10-CM | POA: Diagnosis not present

## 2020-09-08 DIAGNOSIS — N186 End stage renal disease: Secondary | ICD-10-CM | POA: Diagnosis not present

## 2020-09-08 DIAGNOSIS — Z992 Dependence on renal dialysis: Secondary | ICD-10-CM | POA: Diagnosis not present

## 2020-09-10 DIAGNOSIS — Z992 Dependence on renal dialysis: Secondary | ICD-10-CM | POA: Diagnosis not present

## 2020-09-10 DIAGNOSIS — N2581 Secondary hyperparathyroidism of renal origin: Secondary | ICD-10-CM | POA: Diagnosis not present

## 2020-09-10 DIAGNOSIS — N186 End stage renal disease: Secondary | ICD-10-CM | POA: Diagnosis not present

## 2020-09-12 DIAGNOSIS — N186 End stage renal disease: Secondary | ICD-10-CM | POA: Diagnosis not present

## 2020-09-12 DIAGNOSIS — Z992 Dependence on renal dialysis: Secondary | ICD-10-CM | POA: Diagnosis not present

## 2020-09-12 DIAGNOSIS — N2581 Secondary hyperparathyroidism of renal origin: Secondary | ICD-10-CM | POA: Diagnosis not present

## 2020-09-14 ENCOUNTER — Other Ambulatory Visit: Payer: Self-pay

## 2020-09-14 ENCOUNTER — Encounter (HOSPITAL_BASED_OUTPATIENT_CLINIC_OR_DEPARTMENT_OTHER): Payer: Medicare HMO | Attending: Physician Assistant | Admitting: Internal Medicine

## 2020-09-14 DIAGNOSIS — E1122 Type 2 diabetes mellitus with diabetic chronic kidney disease: Secondary | ICD-10-CM | POA: Insufficient documentation

## 2020-09-14 DIAGNOSIS — L97521 Non-pressure chronic ulcer of other part of left foot limited to breakdown of skin: Secondary | ICD-10-CM | POA: Insufficient documentation

## 2020-09-14 DIAGNOSIS — E1151 Type 2 diabetes mellitus with diabetic peripheral angiopathy without gangrene: Secondary | ICD-10-CM | POA: Diagnosis not present

## 2020-09-14 DIAGNOSIS — L97412 Non-pressure chronic ulcer of right heel and midfoot with fat layer exposed: Secondary | ICD-10-CM | POA: Diagnosis not present

## 2020-09-14 DIAGNOSIS — N184 Chronic kidney disease, stage 4 (severe): Secondary | ICD-10-CM | POA: Diagnosis not present

## 2020-09-14 DIAGNOSIS — Z992 Dependence on renal dialysis: Secondary | ICD-10-CM | POA: Insufficient documentation

## 2020-09-14 DIAGNOSIS — L97822 Non-pressure chronic ulcer of other part of left lower leg with fat layer exposed: Secondary | ICD-10-CM | POA: Diagnosis not present

## 2020-09-14 DIAGNOSIS — E11621 Type 2 diabetes mellitus with foot ulcer: Secondary | ICD-10-CM | POA: Insufficient documentation

## 2020-09-14 DIAGNOSIS — L97411 Non-pressure chronic ulcer of right heel and midfoot limited to breakdown of skin: Secondary | ICD-10-CM | POA: Insufficient documentation

## 2020-09-14 DIAGNOSIS — I87323 Chronic venous hypertension (idiopathic) with inflammation of bilateral lower extremity: Secondary | ICD-10-CM | POA: Diagnosis not present

## 2020-09-14 DIAGNOSIS — E1142 Type 2 diabetes mellitus with diabetic polyneuropathy: Secondary | ICD-10-CM | POA: Insufficient documentation

## 2020-09-14 DIAGNOSIS — L97522 Non-pressure chronic ulcer of other part of left foot with fat layer exposed: Secondary | ICD-10-CM | POA: Diagnosis not present

## 2020-09-14 DIAGNOSIS — L97419 Non-pressure chronic ulcer of right heel and midfoot with unspecified severity: Secondary | ICD-10-CM | POA: Diagnosis present

## 2020-09-14 NOTE — Progress Notes (Signed)
Kathleen [name] ([id number]) Visit Report for 09/14/2020 Abuse/Suicide Risk Screen Details Patient Name: Date of Service: Kathleen [name] RES M. 09/14/2020 10:30 A M Medical Record Number: [id number] Patient Account Number: [id number] Date of Birth/Sex: Treating RN: [birth year] (75 y.o. Kathleen [name] Primary Care Sharlie Shreffler: Benito Mccreedy Other Clinician: Referring Jessia Kief: Treating [name]/Extender: Loma Sender in Treatment: 0 Abuse/Suicide Risk Screen Items Answer ABUSE RISK SCREEN: Has anyone close to you tried to hurt or harm you recentlyo No Do you feel uncomfortable with anyone in your familyo No Has anyone forced you do things that you didnt want to doo No Electronic Signature(s) Signed: 09/14/2020 4:13:29 PM By: Deon Pilling Entered By: Deon Pilling on 09/14/2020 10:51:32 -------------------------------------------------------------------------------- Activities of Daily Living Details Patient Name: Date of Service: Kathleen Lah M. 09/14/2020 10:30 A M Medical Record Number: [id number] Patient Account Number: [id number] Date of Birth/Sex: Treating RN: July 21, 1946 (75 y.o. Kathleen [name] Primary Care Mazelle Huebert: Benito Mccreedy Other Clinician: Referring Irlanda Croghan: Treating Kathleen [name]/Extender: Loma Sender in Treatment: 0 Activities of Daily Living Items Answer Activities of Daily Living (Please select one for each item) Drive Automobile Not Able T Medications ake Completely Able Use T elephone Completely Able Care for Appearance Completely Able Use T oilet Completely Able Bath / Shower Need Assistance Dress Self Completely Able Feed Self Completely Able Walk Need Assistance Get In / Out Bed Completely [place] for Self Need Assistance Electronic Signature(s) Signed: 09/14/2020 4:13:29 PM By: Deon Pilling Entered By:  Deon Pilling on 09/14/2020 10:52:20 -------------------------------------------------------------------------------- Education Screening Details Patient Name: Date of Service: Kathleen [name] RES M. 09/14/2020 10:30 A M Medical Record Number: [id number] Patient Account Number: [id number] Date of Birth/Sex: Treating RN: [birth year] (75 y.o. Kathleen [name] Primary Care Kier Smead: Benito Mccreedy Other Clinician: Referring Bertis Hustead: Treating Kathleen [name]/Extender: Loma Sender in Treatment: 0 Primary Learner Assessed: Patient Learning Preferences/Education Level/Primary Language Learning Preference: Explanation, Demonstration, Printed Material Highest Education Level: College or Above Preferred Language: English Cognitive Barrier Language Barrier: No Translator Needed: No Memory Deficit: No Emotional Barrier: No Cultural/Religious Beliefs Affecting Medical Care: No Physical Barrier Impaired Vision: No Impaired Hearing: No Decreased Hand dexterity: No Knowledge/Comprehension Knowledge Level: High Comprehension Level: High Ability to understand written instructions: High Ability to understand verbal instructions: High Motivation Anxiety Level: Calm Cooperation: Cooperative Education Importance: Acknowledges Need Interest in Health Problems: Asks Questions Perception: Coherent Willingness to Engage in Self-Management High Activities: Readiness to Engage in Self-Management High Activities: Electronic Signature(s) Signed: 09/14/2020 4:13:29 PM By: Deon Pilling Entered By: Deon Pilling on 09/14/2020 10:52:59 -------------------------------------------------------------------------------- Fall Risk Assessment Details Patient Name: Date of Service: Kathleen Russel, [name] 09/14/2020 10:30 A M Medical Record Number: [id number] Patient Account Number: [id number] Date of Birth/Sex: Treating RN: [birth year] (75 y.o. Kathleen [name] Primary Care Ilynn Stauffer:  Benito Mccreedy Other Clinician: Referring Trysten Bernard: Treating Kathleen [name]/Extender: Loma Sender in Treatment: 0 Fall Risk Assessment Items Have you had 2 or more falls in the last 12 monthso 0 No Have you had any fall that resulted in injury in the last 12 monthso 0 No FALLS RISK SCREEN History of falling - immediate or within 3 months 0 No Secondary diagnosis (Do you have 2 or more medical diagnoseso) 0 No Ambulatory aid None/bed rest/wheelchair/nurse 0 No Crutches/cane/walker 15 Yes Furniture 0 No Intravenous therapy Access/Saline/Heparin Lock 0 No Gait/Transferring Normal/ bed rest/ wheelchair 0 No Weak (  short steps with or without shuffle, stooped but able to lift head while walking, may seek 10 Yes support from furniture) Impaired (short steps with shuffle, may have difficulty arising from chair, head down, impaired 0 No balance) Mental Status Oriented to own ability 0 Yes Electronic Signature(s) Signed: 09/14/2020 4:13:29 PM By: Deon Pilling Entered By: Deon Pilling on 09/14/2020 10:53:21 -------------------------------------------------------------------------------- Foot Assessment Details Patient Name: Date of Service: Kathleen [name] RES M. 09/14/2020 10:30 A M Medical Record Number: [id number] Patient Account Number: [id number] Date of Birth/Sex: Treating RN: 09/07/1945 (75 y.o. Kathleen [name] Primary Care Ammiel Guiney: Benito Mccreedy Other Clinician: Referring Kay Ricciuti: Treating Kathleen [name]/Extender: Loma Sender in Treatment: 0 Foot Assessment Items Site Locations + = Sensation present, - = Sensation absent, C = Callus, U = Ulcer R = Redness, W = Warmth, M = Maceration, PU = Pre-ulcerative lesion F = Fissure, S = Swelling, D = Dryness Assessment Right: Left: Other Deformity: No No Prior Foot Ulcer: No No Prior Amputation: No No Charcot Joint: No No Ambulatory Status: Ambulatory With Help Assistance  Device: Walker Gait: Steady Electronic Signature(s) Signed: 09/14/2020 4:13:29 PM By: Deon Pilling Entered By: Deon Pilling on 09/14/2020 10:58:13 -------------------------------------------------------------------------------- Nutrition Risk Screening Details Patient Name: Date of Service: Kathleen [name] RES M. 09/14/2020 10:30 A M Medical Record Number: [id number] Patient Account Number: [id number] Date of Birth/Sex: Treating RN: 04/29/1946 (75 y.o. Kathleen [name] Primary Care Kijuana Ruppel: Benito Mccreedy Other Clinician: Referring Jerron Niblack: Treating Leone Putman/Extender: Loma Sender in Treatment: 0 Height (in): 63 Weight (lbs): 187 Body Mass Index (BMI): 33.1 Nutrition Risk Screening Items Score Screening NUTRITION RISK SCREEN: I have an illness or condition that made me change the kind and/or amount of food I eat 2 Yes I eat fewer than two meals per day 0 No I eat few fruits and vegetables, or milk products 0 No I have three or more drinks of beer, liquor or wine almost every day 0 No I have tooth or mouth problems that make it hard for me to eat 0 No I don't always have enough money to buy the food I need 0 No I eat alone most of the time 0 No I take three or more different prescribed or over-the-counter drugs a day 1 Yes Without wanting to, I have lost or gained 10 pounds in the last six months 0 No I am not always physically able to shop, cook and/or feed myself 0 No Nutrition Protocols Good Risk Protocol Provide education on elevated blood Moderate Risk Protocol 0 sugars and impact on wound healing, as applicable High Risk Proctocol Risk Level: Moderate Risk Score: 3 Electronic Signature(s) Signed: 09/14/2020 4:13:29 PM By: Deon Pilling Entered By: Deon Pilling on 09/14/2020 10:53:29

## 2020-09-15 DIAGNOSIS — N2581 Secondary hyperparathyroidism of renal origin: Secondary | ICD-10-CM | POA: Diagnosis not present

## 2020-09-15 DIAGNOSIS — N186 End stage renal disease: Secondary | ICD-10-CM | POA: Diagnosis not present

## 2020-09-15 DIAGNOSIS — Z992 Dependence on renal dialysis: Secondary | ICD-10-CM | POA: Diagnosis not present

## 2020-09-15 NOTE — Progress Notes (Addendum)
SHAKYA, SEBRING ([id number]) Visit Report for 09/14/2020 Allergy List Details Patient Name: Date of Service: Kathleen [name] RES M. 09/14/2020 10:30 A M Medical Record Number: [id number] Patient Account Number: [id number] Date of Birth/Sex: Treating RN: 1946/06/23 (75 y.o. [name] Primary Care Candon Caras: Benito Mccreedy Other Clinician: Referring Shaquna Geigle: Treating Jasmin Winberry/Extender: Loma Sender in Treatment: 0 Allergies Active Allergies No Known Drug Allergies Active: 05/15/2015 Type: Allergen Allergy Notes Electronic Signature(s) Signed: 09/14/2020 4:13:29 PM By: Deon Pilling Entered By: Deon Pilling on 09/14/2020 10:50:15 -------------------------------------------------------------------------------- Arrival Information Details Patient Name: Date of Service: Kathleen [name] RES M. 09/14/2020 10:30 A M Medical Record Number: [id number] Patient Account Number: [id number] Date of Birth/Sex: Treating RN: October 27, 1945 (75 y.o. [name], Meta.Reding Primary Care Lashaunta Sicard: Benito Mccreedy Other Clinician: Referring Elinda Bunten: Treating Captain Blucher/Extender: Loma Sender in Treatment: 0 Visit Information Patient Arrived: Wheel Chair Arrival Time: 10:40 Accompanied By: daughter Transfer Assistance: None Patient Identification Verified: Yes Secondary Verification Process Completed: Yes Patient Requires Transmission-Based Precautions: No Patient Has Alerts: No History Since Last Visit Added or deleted any medications: No Any new allergies or adverse reactions: No Had a fall or experienced change in activities of daily living that may affect risk of falls: No Signs or symptoms of abuse/neglect since last visito No Hospitalized since last visit: No Implantable device outside of the clinic excluding cellular tissue based products placed in the center since last visit: No Has Dressing in Place as Prescribed: Yes Has Compression in  Place as Prescribed: No Electronic Signature(s) Signed: 09/14/2020 4:13:29 PM By: Deon Pilling Entered By: Deon Pilling on 09/14/2020 10:47:31 -------------------------------------------------------------------------------- Clinic Level of Care Assessment Details Patient Name: Date of Service: [name] 09/14/2020 10:30 A M Medical Record Number: [id number] Patient Account Number: [id number] Date of Birth/Sex: Treating RN: September 25, 1945 (75 y.o. [name] Primary Care Schuyler Olden: Benito Mccreedy Other Clinician: Referring Soul Hackman: Treating Terra Aveni/Extender: Loma Sender in Treatment: 0 Clinic Level of Care Assessment Items TOOL 4 Quantity Score X- 1 0 Use when only an EandM is performed on FOLLOW-UP visit ASSESSMENTS - Nursing Assessment / Reassessment X- 1 10 Reassessment of Co-morbidities (includes updates in patient status) X- 1 5 Reassessment of Adherence to Treatment Plan ASSESSMENTS - Wound and Skin A ssessment / Reassessment []  - 0 Simple Wound Assessment / Reassessment - one wound X- 2 5 Complex Wound Assessment / Reassessment - multiple wounds []  - 0 Dermatologic / Skin Assessment (not related to wound area) ASSESSMENTS - Focused Assessment []  - 0 Circumferential Edema Measurements - multi extremities []  - 0 Nutritional Assessment / Counseling / Intervention X- 1 5 Lower Extremity Assessment (monofilament, tuning fork, pulses) []  - 0 Peripheral Arterial Disease Assessment (using hand held doppler) ASSESSMENTS - Ostomy and/or Continence Assessment and Care []  - 0 Incontinence Assessment and Management []  - 0 Ostomy Care Assessment and Management (repouching, etc.) PROCESS - Coordination of Care X - Simple Patient / Family Education for ongoing care 1 15 []  - 0 Complex (extensive) Patient / Family Education for ongoing care X- 1 10 Staff obtains Programmer, systems, Records, T Results / Process Orders est X- 1 10 Staff  telephones HHA, Nursing Homes / Clarify orders / etc []  - 0 Routine Transfer to another Facility (non-emergent condition) []  - 0 Routine Hospital Admission (non-emergent condition) []  - 0 New Admissions / Biomedical engineer / Ordering NPWT Apligraf, etc. , []  - 0 Emergency Hospital Admission (emergent condition) X- 1 10 Simple Discharge  Coordination []  - 0 Complex (extensive) Discharge Coordination PROCESS - Special Needs []  - 0 Pediatric / Minor Patient Management []  - 0 Isolation Patient Management []  - 0 Hearing / Language / Visual special needs []  - 0 Assessment of Community assistance (transportation, D/C planning, etc.) []  - 0 Additional assistance / Altered mentation []  - 0 Support Surface(s) Assessment (bed, cushion, seat, etc.) INTERVENTIONS - Wound Cleansing / Measurement []  - 0 Simple Wound Cleansing - one wound X- 2 5 Complex Wound Cleansing - multiple wounds X- 1 5 Wound Imaging (photographs - any number of wounds) []  - 0 Wound Tracing (instead of photographs) []  - 0 Simple Wound Measurement - one wound X- 2 5 Complex Wound Measurement - multiple wounds INTERVENTIONS - Wound Dressings X - Small Wound Dressing one or multiple wounds 2 10 []  - 0 Medium Wound Dressing one or multiple wounds []  - 0 Large Wound Dressing one or multiple wounds X- 1 5 Application of Medications - topical []  - 0 Application of Medications - injection INTERVENTIONS - Miscellaneous []  - 0 External ear exam []  - 0 Specimen Collection (cultures, biopsies, blood, body fluids, etc.) []  - 0 Specimen(s) / Culture(s) sent or taken to Lab for analysis []  - 0 Patient Transfer (multiple staff / Civil Service fast streamer / Similar devices) []  - 0 Simple Staple / Suture removal (25 or less) []  - 0 Complex Staple / Suture removal (26 or more) []  - 0 Hypo / Hyperglycemic Management (close monitor of Blood Glucose) []  - 0 Ankle / Brachial Index (ABI) - do not check if billed  separately X- 1 5 Vital Signs Has the patient been seen at the hospital within the last three years: Yes Total Score: 130 Level Of Care: New/Established - Level 4 Electronic Signature(s) Signed: 09/14/2020 4:14:28 PM By: Levan Hurst RN, BSN Entered By: Levan Hurst on 09/14/2020 12:15:03 -------------------------------------------------------------------------------- Encounter Discharge Information Details Patient Name: Date of Service: Kathleen [name] RES M. 09/14/2020 10:30 A M Medical Record Number: [id number] Patient Account Number: [id number] Date of Birth/Sex: Treating RN: 11/02/1945 (75 y.o. [name] Primary Care Levelle Edelen: Benito Mccreedy Other Clinician: Referring Jacob Chamblee: Treating Hayes Czaja/Extender: Loma Sender in Treatment: 0 Encounter Discharge Information Items Discharge Condition: Stable Ambulatory Status: Wheelchair Discharge Destination: Home Transportation: Private Auto Accompanied By: daughter Schedule Follow-up Appointment: Yes Clinical Summary of Care: Electronic Signature(s) Signed: 09/14/2020 4:13:29 PM By: Deon Pilling Entered By: Deon Pilling on 09/14/2020 15:20:25 -------------------------------------------------------------------------------- Lower Extremity Assessment Details Patient Name: Date of Service: Kathleen [name] RES M. 09/14/2020 10:30 A M Medical Record Number: [id number] Patient Account Number: [id number] Date of Birth/Sex: Treating RN: Aug 15, 1945 (75 y.o. [name], Tammi Klippel Primary Care Kele Barthelemy: Benito Mccreedy Other Clinician: Referring Presten Joost: Treating Kamare Caspers/Extender: Loma Sender in Treatment: 0 Edema Assessment Assessed: Shirlyn Goltz: Yes] Patrice Paradise: Yes] Edema: [Left: Yes] [Right: Yes] Calf Left: Right: Point of Measurement: 28 cm From Medial Instep 36.5 cm 36 cm Ankle Left: Right: Point of Measurement: 9 cm From Medial Instep 19.5 cm 21 cm Knee To Floor Left:  Right: From Medial Instep 40 cm 40 cm Vascular Assessment Pulses: Dorsalis Pedis Palpable: [Left:Yes] [Right:Yes] Doppler Audible: [Left:Inaudible] [Right:Inaudible] Posterior Tibial Palpable: [Left:Yes] [Right:Yes] Doppler Audible: [Left:Yes] [Right:Yes] Blood Pressure: Brachial: [Left:140] Ankle: [Left:Posterior Tibial: 77 0.55] [Right:Posterior Tibial: 70 0.50] Electronic Signature(s) Signed: 09/14/2020 4:13:29 PM By: Deon Pilling Entered By: Deon Pilling on 09/14/2020 11:08:35 -------------------------------------------------------------------------------- Multi Wound Chart Details Patient Name: Date of Service: Kathleen [name], Kathleen [name] 09/14/2020 10:30 A  M Medical Record Number: [id number] Patient Account Number: [id number] Date of Birth/Sex: Treating RN: 07-Mar-1946 (75 y.o. [name] Primary Care Kelsei Defino: Benito Mccreedy Other Clinician: Referring Kalasia Crafton: Treating Jenae Tomasello/Extender: Loma Sender in Treatment: 0 Vital Signs Height(in): 75 Pulse(bpm): 68 Weight(lbs): 187 Blood Pressure(mmHg): 140/75 Body Mass Index(BMI): 33 Temperature(F): 98 Respiratory Rate(breaths/min): 16 Photos: [10:No Photos Left T Great oe] [11:No Photos Left, Medial Lower Leg] [12:No Photos Left, Anterior Lower Leg] Wound Location: [10:Blister] [11:Blister] [12:Blister] Wounding Event: [10:Diabetic Wound/Ulcer of the Lower] [11:Diabetic Wound/Ulcer of the Lower] [12:Diabetic Wound/Ulcer of the Lower] Primary Etiology: [10:Extremity Arterial Insufficiency Ulcer] [11:Extremity Venous Leg Ulcer] [12:Extremity Venous Leg Ulcer] Secondary Etiology: [10:Cataracts, Glaucoma, Anemia,] [11:Cataracts, Glaucoma, Anemia,] [12:Cataracts, Glaucoma, Anemia,] Comorbid History: [10:Asthma, Congestive Heart Failure, Coronary Artery Disease, Hypertension, Peripheral Arterial Disease, Type II Diabetes, Osteoarthritis 07/14/2020] [11:Asthma, Congestive Heart Failure, Coronary  Artery Disease, Hypertension,  Peripheral Arterial Disease, Type II Diabetes, Osteoarthritis 07/14/2020] [12:Asthma, Congestive Heart Failure, Coronary Artery Disease, Hypertension, Peripheral Arterial Disease, Type II Diabetes, Osteoarthritis 07/14/2020] Date Acquired: [10:0] [11:0] [12:0] Weeks of Treatment: [10:Open] [11:Open] [12:Open] Wound Status: [10:0.9x0.5x0.1] [11:3.5x3.4x0.1] [12:2x0.6x0.1] Measurements L x W x D (cm) [10:0.353] [11:9.346] [12:0.942] A (cm) : rea [10:0.035] [11:0.935] [12:0.094] Volume (cm) : [10:Grade 2] [11:Grade 2] [12:Grade 2] Classification: [10:Medium] [11:Medium] [12:Medium] Exudate A mount: [10:Serosanguineous] [11:Serosanguineous] [12:Serosanguineous] Exudate Type: [10:red, brown] [11:red, brown] [12:red, brown] Exudate Color: [10:Distinct, outline attached] [11:Distinct, outline attached] [12:Distinct, outline attached] Wound Margin: [10:Large (67-100%)] [11:Large (67-100%)] [12:Large (67-100%)] Granulation A mount: [10:Red] [11:Pink, Pale] [12:Pink, Pale] Granulation Quality: [10:Small (1-33%)] [11:None Present (0%)] [12:None Present (0%)] Necrotic A mount: [10:Fat Layer (Subcutaneous Tissue): Yes Fat Layer (Subcutaneous Tissue): Yes Fat Layer (Subcutaneous Tissue): Yes] Exposed Structures: [10:Fascia: No Tendon: No Muscle: No Joint: No Bone: No None] [11:Fascia: No Tendon: No Muscle: No Joint: No Bone: No None] [12:Fascia: No Tendon: No Muscle: No Joint: No Bone: No None] Wound Number: 9 N/A N/A Photos: No Photos N/A N/A Right Calcaneus N/A N/A Wound Location: Blister N/A N/A Wounding Event: Diabetic Wound/Ulcer of the Lower N/A N/A Primary Etiology: Extremity N/A N/A N/A Secondary Etiology: Cataracts, Glaucoma, Anemia, N/A N/A Comorbid History: Asthma, Congestive Heart Failure, Coronary Artery Disease, Hypertension, Peripheral Arterial Disease, Type II Diabetes, Osteoarthritis 07/14/2020 N/A N/A Date Acquired: 0 N/A N/A Weeks of  Treatment: Open N/A N/A Wound Status: 2x1.7x0.1 N/A N/A Measurements L x W x D (cm) 2.67 N/A N/A A (cm) : rea 0.267 N/A N/A Volume (cm) : Grade 2 N/A N/A Classification: Medium N/A N/A Exudate A mount: Serosanguineous N/A N/A Exudate Type: red, brown N/A N/A Exudate Color: Distinct, outline attached N/A N/A Wound Margin: Large (67-100%) N/A N/A Granulation A mount: Pink N/A N/A Granulation Quality: None Present (0%) N/A N/A Necrotic A mount: Fat Layer (Subcutaneous Tissue): Yes N/A N/A Exposed Structures: Fascia: No Tendon: No Muscle: No Joint: No Bone: No None N/A N/A Epithelialization: Treatment Notes Electronic Signature(s) Signed: 09/14/2020 4:14:28 PM By: Levan Hurst RN, BSN Signed: 09/15/2020 8:19:11 AM By: Linton Ham MD Entered By: Linton Ham on 09/14/2020 12:18:48 -------------------------------------------------------------------------------- Multi-Disciplinary Care Plan Details Patient Name: Date of Service: Kathleen [name] RES M. 09/14/2020 10:30 A M Medical Record Number: [id number] Patient Account Number: [id number] Date of Birth/Sex: Treating RN: 02/23/1946 (75 y.o. [name] Primary Care Nicasio Barlowe: Benito Mccreedy Other Clinician: Referring Lillah Standre: Treating Madelin Weseman/Extender: Loma Sender in Treatment: 0 Active Inactive Abuse / Safety / Falls / Self Care Management Nursing Diagnoses: Potential for falls Potential for  injury related to falls Goals: Patient will not experience any injury related to falls Date Initiated: 09/14/2020 Target Resolution Date: 10/16/2020 Goal Status: Active Patient/caregiver will verbalize/demonstrate measures taken to prevent injury and/or falls Date Initiated: 09/14/2020 Target Resolution Date: 10/16/2020 Goal Status: Active Interventions: Assess Activities of Daily Living upon admission and as needed Assess fall risk on admission and as needed Assess: immobility,  friction, shearing, incontinence upon admission and as needed Assess impairment of mobility on admission and as needed per policy Assess personal safety and home safety (as indicated) on admission and as needed Assess self care needs on admission and as needed Provide education on fall prevention Provide education on personal and home safety Notes: Nutrition Nursing Diagnoses: Impaired glucose control: actual or potential Potential for alteratiion in Nutrition/Potential for imbalanced nutrition Goals: Patient/caregiver agrees to and verbalizes understanding of need to use nutritional supplements and/or vitamins as prescribed Date Initiated: 09/14/2020 Target Resolution Date: 10/16/2020 Goal Status: Active Patient/caregiver will maintain therapeutic glucose control Date Initiated: 09/14/2020 Target Resolution Date: 10/16/2020 Goal Status: Active Interventions: Assess HgA1c results as ordered upon admission and as needed Assess patient nutrition upon admission and as needed per policy Provide education on elevated blood sugars and impact on wound healing Provide education on nutrition Notes: Venous Leg Ulcer Nursing Diagnoses: Knowledge deficit related to disease process and management Goals: Patient/caregiver will verbalize understanding of disease process and disease management Date Initiated: 09/14/2020 Target Resolution Date: 10/16/2020 Goal Status: Active Interventions: Assess peripheral edema status every visit. Notes: Wound/Skin Impairment Nursing Diagnoses: Impaired tissue integrity Knowledge deficit related to ulceration/compromised skin integrity Goals: Patient/caregiver will verbalize understanding of skin care regimen Date Initiated: 09/14/2020 Target Resolution Date: 10/16/2020 Goal Status: Active Interventions: Assess patient/caregiver ability to obtain necessary supplies Assess patient/caregiver ability to perform ulcer/skin care regimen upon admission and as  needed Assess ulceration(s) every visit Provide education on ulcer and skin care Notes: Electronic Signature(s) Signed: 09/14/2020 4:14:28 PM By: Levan Hurst RN, BSN Entered By: Levan Hurst on 09/14/2020 11:51:32 -------------------------------------------------------------------------------- Pain Assessment Details Patient Name: Date of Service: Kathleen [name] RES M. 09/14/2020 10:30 A M Medical Record Number: [id number] Patient Account Number: [id number] Date of Birth/Sex: Treating RN: 1946-02-11 (75 y.o. [name] Primary Care Jilleen Essner: Benito Mccreedy Other Clinician: Referring Christne Platts: Treating Oron Westrup/Extender: Loma Sender in Treatment: 0 Active Problems Location of Pain Severity and Description of Pain Patient Has Paino Yes Site Locations Pain Location: Pain in Ulcers Rate the pain. Current Pain Level: 6 Worst Pain Level: 10 Least Pain Level: 0 Tolerable Pain Level: 7 Character of Pain Describe the Pain: Sharp Pain Management and Medication Current Pain Management: Medication: Yes Cold Application: No Rest: Yes Massage: No Activity: No T.E.N.S.: No Heat Application: No Leg drop or elevation: Yes Is the Current Pain Management Adequate: Inadequate How does your wound impact your activities of daily livingo Sleep: No Bathing: No Appetite: No Relationship With Others: No Bladder Continence: No Emotions: No Bowel Continence: No Work: No Toileting: No Drive: No Dressing: No Hobbies: No Notes pain with pressure applied to right heel. Electronic Signature(s) Signed: 09/14/2020 4:13:29 PM By: Deon Pilling Entered By: Deon Pilling on 09/14/2020 10:54:43 -------------------------------------------------------------------------------- Patient/Caregiver Education Details Patient Name: Date of Service: Kathleen [name] RES M. 2/7/2022andnbsp10:30 A M Medical Record Number: [id number] Patient Account Number:  [id number] Date of Birth/Gender: Treating RN: 01/17/1946 (75 y.o. [name] Primary Care Physician: Benito Mccreedy Other Clinician: Referring Physician: Treating Physician/Extender: Loma Sender in Treatment: 0  Education Assessment Education Provided To: Patient Education Topics Provided Elevated Blood Sugar/ Impact on Healing: Methods: Explain/Verbal Responses: State content correctly Nutrition: Methods: Explain/Verbal Responses: State content correctly Safety: Methods: Explain/Verbal Responses: State content correctly Wound/Skin Impairment: Methods: Explain/Verbal Responses: State content correctly Electronic Signature(s) Signed: 09/14/2020 4:14:28 PM By: Levan Hurst RN, BSN Entered By: Levan Hurst on 09/14/2020 11:51:54 -------------------------------------------------------------------------------- Wound Assessment Details Patient Name: Date of Service: Kathleen [name] RES M. 09/14/2020 10:30 A M Medical Record Number: [id number] Patient Account Number: [id number] Date of Birth/Sex: Treating RN: 08-02-1946 (75 y.o. [name], Meta.Reding Primary Care Natayla Cadenhead: Benito Mccreedy Other Clinician: Referring Teairra Millar: Treating Casy Brunetto/Extender: Loma Sender in Treatment: 0 Wound Status Wound Number: 10 Primary Diabetic Wound/Ulcer of the Lower Extremity Etiology: Wound Location: Left T Great oe Secondary Arterial Insufficiency Ulcer Wounding Event: Blister Etiology: Date Acquired: 07/14/2020 Wound Open Weeks Of Treatment: 0 Status: Clustered Wound: No Comorbid Cataracts, Glaucoma, Anemia, Asthma, Congestive Heart Failure, History: Coronary Artery Disease, Hypertension, Peripheral Arterial Disease, Type II Diabetes, Osteoarthritis Photos Photo Uploaded By: Mikeal Hawthorne on 09/15/2020 14:00:26 Wound Measurements Length: (cm) 0.9 Width: (cm) 0.5 Depth: (cm) 0.1 Area: (cm) 0.353 Volume: (cm)  0.035 % Reduction in Area: % Reduction in Volume: Epithelialization: None Tunneling: No Undermining: No Wound Description Classification: Grade 2 Wound Margin: Distinct, outline attached Exudate Amount: Medium Exudate Type: Serosanguineous Exudate Color: red, brown Foul Odor After Cleansing: No Slough/Fibrino Yes Wound Bed Granulation Amount: Large (67-100%) Exposed Structure Granulation Quality: Red Fascia Exposed: No Necrotic Amount: Small (1-33%) Fat Layer (Subcutaneous Tissue) Exposed: Yes Necrotic Quality: Adherent Slough Tendon Exposed: No Muscle Exposed: No Joint Exposed: No Bone Exposed: No Treatment Notes Wound #10 (Toe Great) Wound Laterality: Left Cleanser Normal Saline Discharge Instruction: Cleanse the wound with Normal Saline or soap and water prior to applying a clean dressing using gauze sponges, not tissue or cotton balls. Peri-Wound Care Triamcinolone 15 (g) Discharge Instruction: Use triamcinolone 15 (g) as directed Sween Lotion (Moisturizing lotion) Discharge Instruction: Apply moisturizing lotion as directed Topical Primary Dressing KerraCel Ag Gelling Fiber Dressing, 2x2 in (silver alginate) Discharge Instruction: Apply silver alginate to wound bed as instructed Secondary Dressing Woven Gauze Sponges 2x2 in Discharge Instruction: Apply over primary dressing as directed. Secured With Conforming Stretch Gauze Bandage, Sterile 2x75 (in/in) Discharge Instruction: Secure with stretch gauze as directed. Paper Tape, 1x10 (in/yd) Discharge Instruction: Secure dressing with tape as directed. Compression Wrap Compression Stockings Add-Ons Notes explained the orders, dressings, frequency of change, and when to return to wound center. kerlix and coban applied to both legs lightly as directed. Electronic Signature(s) Signed: 09/14/2020 4:13:29 PM By: Deon Pilling Entered By: Deon Pilling on 09/14/2020  11:13:54 -------------------------------------------------------------------------------- Wound Assessment Details Patient Name: Date of Service: Kathleen [name] RES M. 09/14/2020 10:30 A M Medical Record Number: [id number] Patient Account Number: [id number] Date of Birth/Sex: Treating RN: 1945/09/22 (74 y.o. [name], Tammi Klippel Primary Care Jowan Skillin: Benito Mccreedy Other Clinician: Referring Haidee Stogsdill: Treating Nyx Keady/Extender: Loma Sender in Treatment: 0 Wound Status Wound Number: 11 Primary Diabetic Wound/Ulcer of the Lower Extremity Etiology: Wound Location: Left, Medial Lower Leg Secondary Venous Leg Ulcer Wounding Event: Blister Etiology: Date Acquired: 07/14/2020 Wound Open Weeks Of Treatment: 0 Status: Clustered Wound: No Comorbid Cataracts, Glaucoma, Anemia, Asthma, Congestive Heart Failure, History: Coronary Artery Disease, Hypertension, Peripheral Arterial Disease, Type II Diabetes, Osteoarthritis Photos Wound Measurements Length: (cm) 3.5 Width: (cm) 3.4 Depth: (cm) 0.1 Area: (cm) 9.346 Volume: (cm) 0.935 % Reduction in Area: 0% %  Reduction in Volume: 0% Epithelialization: None Tunneling: No Undermining: No Wound Description Classification: Grade 2 Wound Margin: Distinct, outline attached Exudate Amount: Medium Exudate Type: Serosanguineous Exudate Color: red, brown Foul Odor After Cleansing: No Slough/Fibrino No Wound Bed Granulation Amount: Large (67-100%) Exposed Structure Granulation Quality: Pink, Pale Fascia Exposed: No Necrotic Amount: None Present (0%) Fat Layer (Subcutaneous Tissue) Exposed: Yes Tendon Exposed: No Muscle Exposed: No Joint Exposed: No Bone Exposed: No Treatment Notes Wound #11 (Lower Leg) Wound Laterality: Left, Medial Cleanser Soap and Water Discharge Instruction: May shower and wash wound with dial antibacterial soap and water prior to dressing change. Peri-Wound Care Triamcinolone 15  (g) Discharge Instruction: Use triamcinolone 15 (g) as directed Sween Lotion (Moisturizing lotion) Discharge Instruction: Apply moisturizing lotion as directed Topical Primary Dressing KerraCel Ag Gelling Fiber Dressing, 4x5 in (silver alginate) Discharge Instruction: Apply silver alginate to wound bed as instructed Secondary Dressing Woven Gauze Sponge, Non-Sterile 4x4 in Discharge Instruction: Apply over primary dressing as directed. ABD Pad, 5x9 Discharge Instruction: Apply over primary dressing as directed. Secured With Compression Wrap Kerlix Roll 4.5x3.1 (in/yd) Discharge Instruction: ***WRAP LIGHTLY*** Coban Self-Adherent Wrap 4x5 (in/yd) Discharge Instruction: ***WRAP LIGHTLY*** Compression Stockings Add-Ons Notes explained the orders, dressings, frequency of change, and when to return to wound center. kerlix and coban applied to both legs lightly as directed. Electronic Signature(s) Signed: 09/15/2020 3:20:29 PM By: Mikeal Hawthorne EMT/HBOT/SD Signed: 09/16/2020 4:51:20 PM By: Deon Pilling Previous Signature: 09/14/2020 4:13:29 PM Version By: Deon Pilling Entered By: Mikeal Hawthorne on 09/15/2020 14:03:04 -------------------------------------------------------------------------------- Wound Assessment Details Patient Name: Date of Service: Kathleen [name] RES M. 09/14/2020 10:30 A M Medical Record Number: [id number] Patient Account Number: [id number] Date of Birth/Sex: Treating RN: [birth year] (75 y.o. [name], Meta.Reding Primary Care Neftaly Inzunza: Benito Mccreedy Other Clinician: Referring Arley Garant: Treating Shawanda Sievert/Extender: Loma Sender in Treatment: 0 Wound Status Wound Number: 12 Primary Diabetic Wound/Ulcer of the Lower Extremity Etiology: Wound Location: Left, Anterior Lower Leg Secondary Venous Leg Ulcer Wounding Event: Blister Etiology: Date Acquired: 07/14/2020 Wound Open Weeks Of Treatment: 0 Status: Clustered Wound: No Comorbid  Cataracts, Glaucoma, Anemia, Asthma, Congestive Heart Failure, History: Coronary Artery Disease, Hypertension, Peripheral Arterial Disease, Type II Diabetes, Osteoarthritis Photos Wound Measurements Length: (cm) 2 Width: (cm) 0.6 Depth: (cm) 0.1 Area: (cm) 0.942 Volume: (cm) 0.094 % Reduction in Area: 0% % Reduction in Volume: 0% Epithelialization: None Tunneling: No Undermining: No Wound Description Classification: Grade 2 Wound Margin: Distinct, outline attached Exudate Amount: Medium Exudate Type: Serosanguineous Exudate Color: red, brown Foul Odor After Cleansing: No Slough/Fibrino No Wound Bed Granulation Amount: Large (67-100%) Exposed Structure Granulation Quality: Pink, Pale Fascia Exposed: No Necrotic Amount: None Present (0%) Fat Layer (Subcutaneous Tissue) Exposed: Yes Tendon Exposed: No Muscle Exposed: No Joint Exposed: No Bone Exposed: No Treatment Notes Wound #12 (Lower Leg) Wound Laterality: Left, Anterior Cleanser Soap and Water Discharge Instruction: May shower and wash wound with dial antibacterial soap and water prior to dressing change. Peri-Wound Care Triamcinolone 15 (g) Discharge Instruction: Use triamcinolone 15 (g) as directed Sween Lotion (Moisturizing lotion) Discharge Instruction: Apply moisturizing lotion as directed Topical Primary Dressing KerraCel Ag Gelling Fiber Dressing, 4x5 in (silver alginate) Discharge Instruction: Apply silver alginate to wound bed as instructed Secondary Dressing Woven Gauze Sponge, Non-Sterile 4x4 in Discharge Instruction: Apply over primary dressing as directed. ABD Pad, 5x9 Discharge Instruction: Apply over primary dressing as directed. Secured With Compression Wrap Kerlix Roll 4.5x3.1 (in/yd) Discharge Instruction: ***WRAP LIGHTLY*** Coban Self-Adherent Wrap 4x5 (in/yd) Discharge  Instruction: ***WRAP LIGHTLY*** Compression Stockings Add-Ons Notes explained the orders, dressings, frequency of  change, and when to return to wound center. kerlix and coban applied to both legs lightly as directed. Electronic Signature(s) Signed: 09/15/2020 3:20:29 PM By: Mikeal Hawthorne EMT/HBOT/SD Signed: 09/16/2020 4:51:20 PM By: Deon Pilling Previous Signature: 09/14/2020 4:13:29 PM Version By: Deon Pilling Entered By: Mikeal Hawthorne on 09/15/2020 14:02:43 -------------------------------------------------------------------------------- Wound Assessment Details Patient Name: Date of Service: Kathleen [name] RES M. 09/14/2020 10:30 A M Medical Record Number: [id number] Patient Account Number: [id number] Date of Birth/Sex: Treating RN: 1946-03-09 (75 y.o. [name], Meta.Reding Primary Care Arien Benincasa: Benito Mccreedy Other Clinician: Referring Marlan Steward: Treating Torez Beauregard/Extender: Loma Sender in Treatment: 0 Wound Status Wound Number: 9 Primary Diabetic Wound/Ulcer of the Lower Extremity Etiology: Wound Location: Right Calcaneus Wound Open Wounding Event: Blister Status: Date Acquired: 07/14/2020 Comorbid Cataracts, Glaucoma, Anemia, Asthma, Congestive Heart Failure, Weeks Of Treatment: 0 History: Coronary Artery Disease, Hypertension, Peripheral Arterial Disease, Clustered Wound: No Type II Diabetes, Osteoarthritis Photos Photo Uploaded By: Mikeal Hawthorne on 09/15/2020 14:03:49 Wound Measurements Length: (cm) 2 Width: (cm) 1.7 Depth: (cm) 0.1 Area: (cm) 2.67 Volume: (cm) 0.267 % Reduction in Area: % Reduction in Volume: Epithelialization: None Tunneling: No Undermining: No Wound Description Classification: Grade 2 Wound Margin: Distinct, outline attached Exudate Amount: Medium Exudate Type: Serosanguineous Exudate Color: red, brown Foul Odor After Cleansing: No Slough/Fibrino No Wound Bed Granulation Amount: Large (67-100%) Exposed Structure Granulation Quality: Pink Fascia Exposed: No Necrotic Amount: None Present (0%) Fat Layer (Subcutaneous Tissue)  Exposed: Yes Tendon Exposed: No Muscle Exposed: No Joint Exposed: No Bone Exposed: No Treatment Notes Wound #9 (Calcaneus) Wound Laterality: Right Cleanser Soap and Water Discharge Instruction: May shower and wash wound with dial antibacterial soap and water prior to dressing change. Peri-Wound Care Triamcinolone 15 (g) Discharge Instruction: Use triamcinolone 15 (g) as directed Sween Lotion (Moisturizing lotion) Discharge Instruction: Apply moisturizing lotion as directed Topical Primary Dressing KerraCel Ag Gelling Fiber Dressing, 4x5 in (silver alginate) Discharge Instruction: Apply silver alginate to wound bed as instructed Secondary Dressing Woven Gauze Sponge, Non-Sterile 4x4 in Discharge Instruction: Apply over primary dressing as directed. ALLEVYN Heel 4 1/2in x 5 1/2in / 10.5cm x 13.5cm Discharge Instruction: Apply over primary dressing as directed. Secured With Compression Wrap Kerlix Roll 4.5x3.1 (in/yd) Discharge Instruction: ***WRAP LIGHTLY*** Coban Self-Adherent Wrap 4x5 (in/yd) Discharge Instruction: ***WRAP LIGHTLY*** Compression Stockings Add-Ons Notes explained the orders, dressings, frequency of change, and when to return to wound center. kerlix and coban applied to both legs lightly as directed. Electronic Signature(s) Signed: 09/14/2020 4:13:29 PM By: Deon Pilling Entered By: Deon Pilling on 09/14/2020 11:11:55 -------------------------------------------------------------------------------- Vitals Details Patient Name: Date of Service: Kathleen [name], Kathleen RES M. 09/14/2020 10:30 A M Medical Record Number: [id number] Patient Account Number: [id number] Date of Birth/Sex: Treating RN: 1946/02/07 (75 y.o. [name], Tammi Klippel Primary Care Guilianna Mckoy: Benito Mccreedy Other Clinician: Referring Broc Caspers: Treating Gwynne Kemnitz/Extender: Loma Sender in Treatment: 0 Vital Signs Time Taken: 10:40 Temperature (F): 98 Height (in): 63 Pulse  (bpm): 97 Source: Stated Respiratory Rate (breaths/min): 16 Weight (lbs): 187 Blood Pressure (mmHg): 140/75 Source: Stated Reference Range: 80 - 120 mg / dl Body Mass Index (BMI): 33.1 Electronic Signature(s) Signed: 09/14/2020 4:13:29 PM By: Deon Pilling Entered By: Deon Pilling on 09/14/2020 10:49:49

## 2020-09-15 NOTE — Progress Notes (Signed)
Kathleen [name], Kathleen [name] ([id number]) Visit Report for 09/14/2020 HPI Details Patient Name: Date of Service: Kathleen [name] RES M. 09/14/2020 10:30 A M Medical Record Number: [id number] Patient Account Number: [id number] Date of Birth/Sex: Treating RN: 1945-09-18 (75 y.o. Kathleen [name] Primary Care Provider: Benito [name] Other Clinician: Referring Provider: Treating Provider/Extender: Kathleen [name] in Treatment: 0 History of Present Illness HPI Description: 05/14/15; this is a 75 year old woman with a history of diabetes who was referred here I think after being seen in the emergency room on 9/29 with sudden edema of the right leg and a large resultant blister on the posterior medial part of the right leg. She had a previous blister about a week before her arrival there however this it open by the time she came to the emergency room. She has no history of known PAD, prior wound. She does have a history of a cardiomyopathy with her last echo on 10/15 showing an EF of 50-55%. She was given clindamycin orally and she is about to complete these in 2 days. She was felt to have cellulitis of the right lower extremity. 05/22/15; her duplex ultrasound was negative for DVT The Kerlix Coban appears to be controlling her edema. The substantial wound area on the right posterior . medial leg looks to be improved with epithelialization. She still has a reasonably substantial open area however I think this is going to heal there is no evidence of infection. Her peripheral pulses are not palpable in this foot. I think this lady probably has some degree of PAD as well. interestingly, she comes in with a crop of blisters just below her knee. These are itchy and if they have been more dermatomal I might of considered zoster. However I think this is probably some form of contact dermatitis question Coban oo 05/28/15. The area on the medial aspect of her right leg continues to be improved in  terms of advancing epithelialization. There is no evidence of infection. She probably has some degree of PAD. The contact dermatitis last blisters in the top of her right leg from last week are resolved. Finally she on the left leg she has a large blister which I opened this was flaccid she has edema in the left leg and I think this is the cause 06/05/15. Her right leg wound is completely epithelialized. There is no open wounds on her right leg. I had to remove the remaining skin from the blister on her left leg however this looks superficial and should be resolved next week. It also looks like she has some friction injury on the top of her right anterior first metatarsal head. I've advised to protect this in her shoes 06/11/15; she has now to open areas one posteriorly on the left leg and one anteriorly on the left. Still poorly controlled edema and may be developing a small additional blister on the right leg. None of this looks to have infection.she is obtained juxtalite stockings at home but she did not bring them today 06/19/15; patient returns today all of her wounds are closed. She has significant venous insufficiency with inflammation right greater than left considerable hemosiderin deposition on the right. We are able to discharge her in her juxtalite stockings. I gave her instructions for lubricating the skin her lower legs twice a day READMISSION; 02/05/16; this is a patient we had in this clinic from 10/7 through 11/4 2016. At that point I felt she had coexistent venous insufficiency and edema and PAD. She  presented with a blister and cellulitis on the medial right leg which ultimately form the wound which progressively epithelialized over several weeks and then resolved. During her stay here she does not seem to pad noninvasive arterial studies. She also developed large flaccid blisters on her left leg that formed wounds as well. We ultimately discharged. Juxtalite stockings to control the  swelling. The patient is a type II diabetic on insulin. He would appear that she has fairly significant chronic renal failure with the last creatinine on 4/22,017 at 3.36. Estimated GFR of 13. Last hemoglobin A1c of 9.0 in April 2017 the patient was seen by her primary physician Dr. Iona Beard [name]. He noted what sounds like cellulitis on the posterior left leg and a clean 6 cm x 5 cm ulcer with surrounding skin redness. She was put on antibiotics. The patient states she has not had any wounds on her legs since she was here in the fall. I'm not exactly sure how she treated the ulcer in her primary physician's office however she arrived with no ulcer on the left leg only eschar that peeled off to reveal normal skin. It is fairly clear that the patient is not wearing her juxtalite stockings. She is not describing shortness breath or chest pain but does describe heaviness in her legs when she walks forcing her to stop and rest. In this clinic her ABI on the right was 0.65 and on the left 0.54. 02/19/16; the patient arrived with a small hemorrhagic blister on the left anterior leg. There is nothing on the right. She's been to see Dr. Gwenlyn [name] the formal studies showed an ABI in the right of 0.64 on the left of 0.59. TBI in the right 0.48 0.49 on the left. She had bilateral common femoral artery waves were triphasic. Bilateral popliteal peroneal anterior and posterior tibial arteries were monophasic. She has a follow-up with Dr. Gwenlyn [name] next week 02/26/16; left leg blisters healed however she has a right leg blister this was excised with pickups and scissors. She's been to see Dr. Gwenlyn [name] who notes her chronic renal failure, the reasonably unimpressive nature of her wounds and did not want to proceed with angiography. He will see her in 3 months 03/04/16; left leg blisters have healed however she did not come in with her juxtalite stockings. She has been to see Dr. Gwenlyn [name] did not want to proceed with angiography.  Follow-up in 3 months. She developed a blister on the right last week we excised it. Dimensions are better 09/29/16- the patient made an appointment for a large blister on the anterior aspect of her right lower extremity since making the appointment the blister deroofed and has completely healed and epithelialized. She has not been using her juxta lites that were prescribed nor has she been wearing compression stockings. READMISSION 09/14/2020 This is a patient that we had in the clinic in 2016 and 17. She was last here in February 2018. These were largely for what was felt to be chronic venous insufficiency wounds on her bilateral lower extremities. At one point she had bilateral juxta lite stockings. She was also noted to have diabetic angiopathy with PAD. She went to see Dr. Gwenlyn [name] but because the wounds were predominantly venous and she was in stage IV chronic renal failure nothing further was done about her PAD. She comes back in clinic today with wounds on her right heel, right first toe, left first toe and 3 wounds on the left anterior lower extremity. She is complaining of  a lot of pain in the left foot, right heel. She is not a smoker. Past medical history includes coronary artery disease with ischemic cardiomyopathy, type 2 diabetes, coronary artery disease, osteoarthritis, hypertension, chronic renal failure now on dialysis, hyperlipidemia, asthma ABIs in our clinic today were 0.5 on the right and 0.55 on the left Electronic Signature(s) Signed: 09/15/2020 8:19:11 AM By: Linton Ham MD Signed: 09/15/2020 8:19:11 AM By: Linton Ham MD Entered By: Linton Ham on 09/14/2020 12:23:07 -------------------------------------------------------------------------------- Physical Exam Details Patient Name: Date of Service: Kathleen [name] RES M. 09/14/2020 10:30 A M Medical Record Number: [id number] Patient Account Number: [id number] Date of Birth/Sex: Treating RN: 07/28/1946 (75 y.o. Kathleen [name] Primary Care Provider: Benito [name] Other Clinician: Referring Provider: Treating Provider/Extender: Kathleen [name] in Treatment: 0 Constitutional Sitting or standing Blood Pressure is within target range for patient.. Pulse regular and within target range for patient.[name] Respirations regular, non-labored and within target range.. Temperature is normal and within the target range for the patient.[name] Appears in no distress. Respiratory work of breathing is normal. Cardiovascular Could not feel her popliteal pulses bilateral. Pedal pulses absent bilaterally.. Integumentary (Hair, Skin) Skin changes of chronic venous insufficiency. Notes Wound exam The patient has 3 full-thickness wounds on the left anterior lower leg. This looks like venous insufficiency wounds surrounding hemosiderin deposition On the medial part of the left great toenail there is an open area. I almost wondered whether this was chronic paronychia I with a thick tinea all nail. There is an area on the tip of the right first toe which is breaking down very superficial The right heel is a small superficial wound but extremely painful. Electronic Signature(s) Signed: 09/15/2020 8:19:11 AM By: Linton Ham MD Entered By: Linton Ham on 09/14/2020 12:31:32 -------------------------------------------------------------------------------- Physician Orders Details Patient Name: Date of Service: Kathleen [name] RES M. 09/14/2020 10:30 A M Medical Record Number: [id number] Patient Account Number: [id number] Date of Birth/Sex: Treating RN: 1945-12-22 (75 y.o. Kathleen [name] Primary Care Provider: Benito [name] Other Clinician: Referring Provider: Treating Provider/Extender: Kathleen [name] in Treatment: 0 Verbal / Phone Orders: No Diagnosis Coding Follow-up Appointments Return Appointment in 1 week. Bathing/ Shower/ Hygiene May shower with protection but  do not get wound dressing(s) wet. Edema Control - Lymphedema / SCD / Other Elevate legs to the level of the heart or above for 30 minutes daily and/or when sitting, a frequency of: - throughout the day Avoid standing for long periods of time. Exercise regularly Wound Treatment Wound #10 - T Great oe Wound Laterality: Left Cleanser: Normal Saline Every Other Day/7 Days Discharge Instructions: Cleanse the wound with Normal Saline or soap and water prior to applying a clean dressing using gauze sponges, not tissue or cotton balls. Peri-Wound Care: Triamcinolone 15 (g) Every Other Day/7 Days Discharge Instructions: Use triamcinolone 15 (g) as directed Peri-Wound Care: Sween Lotion (Moisturizing lotion) Every Other Day/7 Days Discharge Instructions: Apply moisturizing lotion as directed Prim Dressing: KerraCel Ag Gelling Fiber Dressing, 2x2 in (silver alginate) Every Other Day/7 Days ary Discharge Instructions: Apply silver alginate to wound bed as instructed Secondary Dressing: Woven Gauze Sponges 2x2 in Every Other Day/7 Days Discharge Instructions: Apply over primary dressing as directed. Secured With: Child psychotherapist, Sterile 2x75 (in/in) Every Other Day/7 Days Discharge Instructions: Secure with stretch gauze as directed. Secured With: Paper Tape, 1x10 (in/yd) Every Other Day/7 Days Discharge Instructions: Secure dressing with tape as directed. Wound #11 - Lower Leg Wound  Laterality: Left, Medial Cleanser: Soap and Water 1 x Per Week Discharge Instructions: May shower and wash wound with dial antibacterial soap and water prior to dressing change. Peri-Wound Care: Triamcinolone 15 (g) 1 x Per Week Discharge Instructions: Use triamcinolone 15 (g) as directed Peri-Wound Care: Sween Lotion (Moisturizing lotion) 1 x Per Week Discharge Instructions: Apply moisturizing lotion as directed Prim Dressing: KerraCel Ag Gelling Fiber Dressing, 4x5 in (silver alginate) 1 x Per  Week ary Discharge Instructions: Apply silver alginate to wound bed as instructed Secondary Dressing: Woven Gauze Sponge, Non-Sterile 4x4 in 1 x Per Week Discharge Instructions: Apply over primary dressing as directed. Secondary Dressing: ABD Pad, 5x9 1 x Per Week Discharge Instructions: Apply over primary dressing as directed. Compression Wrap: Kerlix Roll 4.5x3.1 (in/yd) 1 x Per Week Discharge Instructions: ***WRAP LIGHTLY*** Compression Wrap: Coban Self-Adherent Wrap 4x5 (in/yd) 1 x Per Week Discharge Instructions: ***WRAP LIGHTLY*** Wound #12 - Lower Leg Wound Laterality: Left, Anterior Cleanser: Soap and Water 1 x Per Week Discharge Instructions: May shower and wash wound with dial antibacterial soap and water prior to dressing change. Peri-Wound Care: Triamcinolone 15 (g) 1 x Per Week Discharge Instructions: Use triamcinolone 15 (g) as directed Peri-Wound Care: Sween Lotion (Moisturizing lotion) 1 x Per Week Discharge Instructions: Apply moisturizing lotion as directed Prim Dressing: KerraCel Ag Gelling Fiber Dressing, 4x5 in (silver alginate) 1 x Per Week ary Discharge Instructions: Apply silver alginate to wound bed as instructed Secondary Dressing: Woven Gauze Sponge, Non-Sterile 4x4 in 1 x Per Week Discharge Instructions: Apply over primary dressing as directed. Secondary Dressing: ABD Pad, 5x9 1 x Per Week Discharge Instructions: Apply over primary dressing as directed. Compression Wrap: Kerlix Roll 4.5x3.1 (in/yd) 1 x Per Week Discharge Instructions: ***WRAP LIGHTLY*** Compression Wrap: Coban Self-Adherent Wrap 4x5 (in/yd) 1 x Per Week Discharge Instructions: ***WRAP LIGHTLY*** Wound #9 - Calcaneus Wound Laterality: Right Cleanser: Soap and Water 1 x Per Week Discharge Instructions: May shower and wash wound with dial antibacterial soap and water prior to dressing change. Peri-Wound Care: Triamcinolone 15 (g) 1 x Per Week Discharge Instructions: Use triamcinolone 15 (g)  as directed Peri-Wound Care: Sween Lotion (Moisturizing lotion) 1 x Per Week Discharge Instructions: Apply moisturizing lotion as directed Prim Dressing: KerraCel Ag Gelling Fiber Dressing, 4x5 in (silver alginate) 1 x Per Week ary Discharge Instructions: Apply silver alginate to wound bed as instructed Secondary Dressing: Woven Gauze Sponge, Non-Sterile 4x4 in 1 x Per Week Discharge Instructions: Apply over primary dressing as directed. Secondary Dressing: ALLEVYN Heel 4 1/2in x 5 1/2in / 10.5cm x 13.5cm 1 x Per Week Discharge Instructions: Apply over primary dressing as directed. Compression Wrap: Kerlix Roll 4.5x3.1 (in/yd) 1 x Per Week Discharge Instructions: ***WRAP LIGHTLY*** Compression Wrap: Coban Self-Adherent Wrap 4x5 (in/yd) 1 x Per Week Discharge Instructions: ***WRAP LIGHTLY*** Services and Therapies rterial Studies- Bilateral with TBIs - Non palpable pulses, non healing wounds on bilateral lower extremities, abnormal dopplers in clinic Dr. Gwenlyn [name] - A Electronic Signature(s) Signed: 09/14/2020 4:14:28 PM By: Levan Hurst RN, BSN Signed: 09/15/2020 8:19:11 AM By: Linton Ham MD Entered By: Levan Hurst on 09/14/2020 11:56:42 Prescription 09/14/2020 -------------------------------------------------------------------------------- Jarold Motto MD Patient Name: Provider: January 11, 1946 [id number] Date of Birth: NPI#Jesse Sans Sex: DEA #: [phone] [id number] Phone #: License #: [place] Patient Address: [address] [place], [place] [address] [place], [place] [address] [phone] Allergies No Known Drug Allergies Provider's Orders rterial Studies-  Bilateral with TBIs - Non palpable pulses, non healing wounds on bilateral lower extremities, abnormal dopplers in clinic Dr. Gwenlyn [name] - A Hand Signature: Date(s): Electronic Signature(s) Signed: 09/14/2020 4:14:28 PM By: Levan Hurst RN, BSN Signed: 09/15/2020 8:19:11 AM By: Linton Ham MD Entered By: Levan Hurst on 09/14/2020 11:56:42 -------------------------------------------------------------------------------- Problem List Details Patient Name: Date of Service: Kathleen [name] RES M. 09/14/2020 10:30 A M Medical Record Number: [id number] Patient Account Number: [id number] Date of Birth/Sex: Treating RN: 08-17-1945 (75 y.o. Kathleen [name] Primary Care Provider: Benito [name] Other Clinician: Referring Provider: Treating Provider/Extender: Kathleen [name] in Treatment: 0 Active Problems ICD-10 Encounter Code Description Active Date MDM Diagnosis E11.51 Type 2 diabetes mellitus with diabetic peripheral angiopathy without gangrene 09/14/2020 No Yes I87.323 Chronic venous hypertension (idiopathic) with inflammation of bilateral lower 09/14/2020 No Yes extremity L97.411 Non-pressure chronic ulcer of right heel and midfoot limited to breakdown of 09/14/2020 No Yes skin L97.521 Non-pressure chronic ulcer of other part of left foot limited to breakdown of 09/14/2020 No Yes skin L97.822 Non-pressure chronic ulcer of other part of left lower leg with fat layer exposed2/02/2021 No Yes E11.42 Type 2 diabetes mellitus with diabetic polyneuropathy 09/14/2020 No Yes Inactive Problems Resolved Problems Electronic Signature(s) Signed: 09/15/2020 8:19:11 AM By: Linton Ham MD Entered By: Linton Ham on 09/14/2020 12:18:36 -------------------------------------------------------------------------------- Progress Note Details Patient Name: Date of Service: Kathleen [name] RES M. 09/14/2020 10:30 A M Medical Record Number: [id number] Patient Account Number: [id number] Date of Birth/Sex: Treating RN: Jan 12, 1946 (75 y.o. Kathleen [name] Primary Care Provider: Benito [name] Other Clinician: Referring Provider: Treating Provider/Extender: Kathleen [name] in  Treatment: 0 Subjective History of Present Illness (HPI) 05/14/15; this is a 75 year old woman with a history of diabetes who was referred here I think after being seen in the emergency room on 9/29 with sudden edema of the right leg and a large resultant blister on the posterior medial part of the right leg. She had a previous blister about a week before her arrival there however this it open by the time she came to the emergency room. She has no history of known PAD, prior wound. She does have a history of a cardiomyopathy with her last echo on 10/15 showing an EF of 50-55%. She was given clindamycin orally and she is about to complete these in 2 days. She was felt to have cellulitis of the right lower extremity. 05/22/15; her duplex ultrasound was negative for DVT The Kerlix Coban appears to be controlling her edema. The substantial wound area on the right posterior . medial leg looks to be improved with epithelialization. She still has a reasonably substantial open area however I think this is going to heal there is no evidence of infection. Her peripheral pulses are not palpable in this foot. I think this lady probably has some degree of PAD as well. interestingly, she comes in with a crop of blisters just below her knee. These are itchy and if they have been more dermatomal I might of considered zoster. However I think this is probably some form of contact dermatitis question Coban oo 05/28/15. The area on the medial aspect of her right leg continues to be improved in terms of advancing epithelialization. There is no evidence of infection. She probably has some degree of PAD. The contact dermatitis last blisters in the top of her right leg from last week are resolved. Finally she on the left leg she has a large blister which  I opened this was flaccid she has edema in the left leg and I think this is the cause 06/05/15. Her right leg wound is completely epithelialized. There is no open wounds on  her right leg. I had to remove the remaining skin from the blister on her left leg however this looks superficial and should be resolved next week. It also looks like she has some friction injury on the top of her right anterior first metatarsal head. I've advised to protect this in her shoes 06/11/15; she has now to open areas one posteriorly on the left leg and one anteriorly on the left. Still poorly controlled edema and may be developing a small additional blister on the right leg. None of this looks to have infection.she is obtained juxtalite stockings at home but she did not bring them today 06/19/15; patient returns today all of her wounds are closed. She has significant venous insufficiency with inflammation right greater than left considerable hemosiderin deposition on the right. We are able to discharge her in her juxtalite stockings. I gave her instructions for lubricating the skin her lower legs twice a day READMISSION; 02/05/16; this is a patient we had in this clinic from 10/7 through 11/4 2016. At that point I felt she had coexistent venous insufficiency and edema and PAD. She presented with a blister and cellulitis on the medial right leg which ultimately form the wound which progressively epithelialized over several weeks and then resolved. During her stay here she does not seem to pad noninvasive arterial studies. She also developed large flaccid blisters on her left leg that formed wounds as well. We ultimately discharged. Juxtalite stockings to control the swelling. The patient is a type II diabetic on insulin. He would appear that she has fairly significant chronic renal failure with the last creatinine on 4/22,017 at 3.36. Estimated GFR of 13. Last hemoglobin A1c of 9.0 in April 2017 the patient was seen by her primary physician Dr. Iona Beard [name]. He noted what sounds like cellulitis on the posterior left leg and a clean 6 cm x 5 cm ulcer with surrounding skin redness. She was  put on antibiotics. The patient states she has not had any wounds on her legs since she was here in the fall. I'm not exactly sure how she treated the ulcer in her primary physician's office however she arrived with no ulcer on the left leg only eschar that peeled off to reveal normal skin. It is fairly clear that the patient is not wearing her juxtalite stockings. She is not describing shortness breath or chest pain but does describe heaviness in her legs when she walks forcing her to stop and rest. In this clinic her ABI on the right was 0.65 and on the left 0.54. 02/19/16; the patient arrived with a small hemorrhagic blister on the left anterior leg. There is nothing on the right. She's been to see Dr. Gwenlyn [name] the formal studies showed an ABI in the right of 0.64 on the left of 0.59. TBI in the right 0.48 0.49 on the left. She had bilateral common femoral artery waves were triphasic. Bilateral popliteal peroneal anterior and posterior tibial arteries were monophasic. She has a follow-up with Dr. Gwenlyn [name] next week 02/26/16; left leg blisters healed however she has a right leg blister this was excised with pickups and scissors. She's been to see Dr. Gwenlyn [name] who notes her chronic renal failure, the reasonably unimpressive nature of her wounds and did not want to proceed with angiography. He will  see her in 3 months 03/04/16; left leg blisters have healed however she did not come in with her juxtalite stockings. She has been to see Dr. Gwenlyn [name] did not want to proceed with angiography. Follow-up in 3 months. She developed a blister on the right last week we excised it. Dimensions are better 09/29/16- the patient made an appointment for a large blister on the anterior aspect of her right lower extremity since making the appointment the blister deroofed and has completely healed and epithelialized. She has not been using her juxta lites that were prescribed nor has she been wearing  compression stockings. READMISSION 09/14/2020 This is a patient that we had in the clinic in 2016 and 17. She was last here in February 2018. These were largely for what was felt to be chronic venous insufficiency wounds on her bilateral lower extremities. At one point she had bilateral juxta lite stockings. She was also noted to have diabetic angiopathy with PAD. She went to see Dr. Gwenlyn [name] but because the wounds were predominantly venous and she was in stage IV chronic renal failure nothing further was done about her PAD. She comes back in clinic today with wounds on her right heel, right first toe, left first toe and 3 wounds on the left anterior lower extremity. She is complaining of a lot of pain in the left foot, right heel. She is not a smoker. Past medical history includes coronary artery disease with ischemic cardiomyopathy, type 2 diabetes, coronary artery disease, osteoarthritis, hypertension, chronic renal failure now on dialysis, hyperlipidemia, asthma ABIs in our clinic today were 0.5 on the right and 0.55 on the left Patient History Information obtained from Patient. Allergies No Known Drug Allergies Family History Cancer - Siblings, Diabetes - Mother,Siblings,Maternal Grandparents, Heart Disease - Mother, Hypertension - Mother,Father,Siblings, Kidney Disease - Mother, Stroke - Mother, Thyroid Problems - Child, No family history of Hereditary Spherocytosis, Lung Disease, Seizures, Tuberculosis. Social History Former smoker - quit 10 yr ago, Marital Status - Divorced, Alcohol Use - Never, Drug Use - No History, Caffeine Use - Daily - soda , tea. Medical History Eyes Patient has history of Cataracts - surgical removal, Glaucoma - eye drops Denies history of Optic Neuritis Ear/Nose/Mouth/Throat Denies history of Chronic sinus problems/congestion, Middle ear problems Hematologic/Lymphatic Patient has history of Anemia - acute and chronic Denies history of Hemophilia, Human  Immunodeficiency Virus, Lymphedema, Sickle Cell Disease Respiratory Patient has history of Asthma Denies history of Aspiration, Chronic Obstructive Pulmonary Disease (COPD), Pneumothorax, Sleep Apnea, Tuberculosis Cardiovascular Patient has history of Congestive Heart Failure - combined, Coronary Artery Disease, Hypertension, Peripheral Arterial Disease Denies history of Angina, Arrhythmia, Deep Vein Thrombosis, Hypotension, Myocardial Infarction, Peripheral Venous Disease, Phlebitis, Vasculitis Gastrointestinal Denies history of Cirrhosis , Colitis, Crohnoos, Hepatitis A, Hepatitis B, Hepatitis C Endocrine Patient has history of Type II Diabetes Denies history of Type I Diabetes Genitourinary Denies history of End Stage Renal Disease Immunological Denies history of Lupus Erythematosus, Raynaudoos, Scleroderma Integumentary (Skin) Denies history of History of Burn Musculoskeletal Patient has history of Osteoarthritis Denies history of Gout, Rheumatoid Arthritis, Osteomyelitis Neurologic Denies history of Dementia, Neuropathy, Quadriplegia, Paraplegia, Seizure Disorder Oncologic Denies history of Received Chemotherapy, Received Radiation Psychiatric Denies history of Anorexia/bulimia, Confinement Anxiety Hospitalization/Surgery History - GI Bleed. - tonsillectomy. - appendectomy. Medical A Surgical History Notes nd Constitutional Symptoms (General Health) Meningioma, Obesity , positional vertigo , syncopeandcollapse , elevated troponin , somnolence , noncompliance , Hematologic/Lymphatic VitD deficiency Respiratory acute resp. failure with hypercapnia , resp. acidosis ,  pulmonary vasc congestion , dyspnea , Cardiovascular Hyperlipidemia , (L) carotid bruit ,hypernatremia , (L) pleural effusion , Gastrointestinal recent extensive diverticulosis with acute rectal bleeding Genitourinary Chronic Kidney Disease stage IV , ARF , Integumentary (Skin) cellulitis and abscess  RLE Objective Constitutional Sitting or standing Blood Pressure is within target range for patient.. Pulse regular and within target range for patient.[name] Respirations regular, non-labored and within target range.. Temperature is normal and within the target range for the patient.[name] Appears in no distress. Vitals Time Taken: 10:40 AM, Height: 63 in, Source: Stated, Weight: 187 lbs, Source: Stated, BMI: 33.1, Temperature: 98 F, Pulse: 97 bpm, Respiratory Rate: 16 breaths/min, Blood Pressure: 140/75 mmHg. Respiratory work of breathing is normal. Cardiovascular Could not feel her popliteal pulses bilateral. Pedal pulses absent bilaterally.. General Notes: Wound exam ooThe patient has 3 full-thickness wounds on the left anterior lower leg. This looks like venous insufficiency wounds surrounding hemosiderin deposition ooOn the medial part of the left great toenail there is an open area. I almost wondered whether this was chronic paronychia I with a thick tinea all nail. ooThere is an area on the tip of the right first toe which is breaking down very superficial ooThe right heel is a small superficial wound but extremely painful. Integumentary (Hair, Skin) Skin changes of chronic venous insufficiency. Wound #10 status is Open. Original cause of wound was Blister. The wound is located on the Left T Great. The wound measures 0.9cm length x 0.5cm width oe x 0.1cm depth; 0.353cm^2 area and 0.035cm^3 volume. There is Fat Layer (Subcutaneous Tissue) exposed. There is no tunneling or undermining noted. There is a medium amount of serosanguineous drainage noted. The wound margin is distinct with the outline attached to the wound base. There is large (67-100%) red granulation within the wound bed. There is a small (1-33%) amount of necrotic tissue within the wound bed including Adherent Slough. Wound #11 status is Open. Original cause of wound was Blister. The wound is located on the Left,Medial Lower Leg.  The wound measures 3.5cm length x 3.4cm width x 0.1cm depth; 9.346cm^2 area and 0.935cm^3 volume. There is Fat Layer (Subcutaneous Tissue) exposed. There is no tunneling or undermining noted. There is a medium amount of serosanguineous drainage noted. The wound margin is distinct with the outline attached to the wound base. There is large (67- 100%) pink, pale granulation within the wound bed. There is no necrotic tissue within the wound bed. Wound #12 status is Open. Original cause of wound was Blister. The wound is located on the Left,Anterior Lower Leg. The wound measures 2cm length x 0.6cm width x 0.1cm depth; 0.942cm^2 area and 0.094cm^3 volume. There is Fat Layer (Subcutaneous Tissue) exposed. There is no tunneling or undermining noted. There is a medium amount of serosanguineous drainage noted. The wound margin is distinct with the outline attached to the wound base. There is large (67- 100%) pink, pale granulation within the wound bed. There is no necrotic tissue within the wound bed. Wound #9 status is Open. Original cause of wound was Blister. The wound is located on the Right Calcaneus. The wound measures 2cm length x 1.7cm width x 0.1cm depth; 2.67cm^2 area and 0.267cm^3 volume. There is Fat Layer (Subcutaneous Tissue) exposed. There is no tunneling or undermining noted. There is a medium amount of serosanguineous drainage noted. The wound margin is distinct with the outline attached to the wound base. There is large (67-100%) pink granulation within the wound bed. There is no necrotic tissue within the  wound bed. Assessment Active Problems ICD-10 Type 2 diabetes mellitus with diabetic peripheral angiopathy without gangrene Chronic venous hypertension (idiopathic) with inflammation of bilateral lower extremity Non-pressure chronic ulcer of right heel and midfoot limited to breakdown of skin Non-pressure chronic ulcer of other part of left foot limited to breakdown of skin Non-pressure  chronic ulcer of other part of left lower leg with fat layer exposed Type 2 diabetes mellitus with diabetic polyneuropathy Plan Follow-up Appointments: Return Appointment in 1 week. Bathing/ Shower/ Hygiene: May shower with protection but do not get wound dressing(s) wet. Edema Control - Lymphedema / SCD / Other: Elevate legs to the level of the heart or above for 30 minutes daily and/or when sitting, a frequency of: - throughout the day Avoid standing for long periods of time. Exercise regularly Services and Therapies ordered were: Dr. Gwenlyn [name] - Arterial Studies- Bilateral with TBIs - Non palpable pulses, non healing wounds on bilateral lower extremities, abnormal dopplers in clinic WOUND #10: - T Great Wound Laterality: Left oe Cleanser: Normal Saline Every Other Day/7 Days Discharge Instructions: Cleanse the wound with Normal Saline or soap and water prior to applying a clean dressing using gauze sponges, not tissue or cotton balls. Peri-Wound Care: Triamcinolone 15 (g) Every Other Day/7 Days Discharge Instructions: Use triamcinolone 15 (g) as directed Peri-Wound Care: Sween Lotion (Moisturizing lotion) Every Other Day/7 Days Discharge Instructions: Apply moisturizing lotion as directed Prim Dressing: KerraCel Ag Gelling Fiber Dressing, 2x2 in (silver alginate) Every Other Day/7 Days ary Discharge Instructions: Apply silver alginate to wound bed as instructed Secondary Dressing: Woven Gauze Sponges 2x2 in Every Other Day/7 Days Discharge Instructions: Apply over primary dressing as directed. Secured With: Child psychotherapist, Sterile 2x75 (in/in) Every Other Day/7 Days Discharge Instructions: Secure with stretch gauze as directed. Secured With: Paper T ape, 1x10 (in/yd) Every Other Day/7 Days Discharge Instructions: Secure dressing with tape as directed. WOUND #11: - Lower Leg Wound Laterality: Left, Medial Cleanser: Soap and Water 1 x Per Week/ Discharge Instructions:  May shower and wash wound with dial antibacterial soap and water prior to dressing change. Peri-Wound Care: Triamcinolone 15 (g) 1 x Per Week/ Discharge Instructions: Use triamcinolone 15 (g) as directed Peri-Wound Care: Sween Lotion (Moisturizing lotion) 1 x Per Week/ Discharge Instructions: Apply moisturizing lotion as directed Prim Dressing: KerraCel Ag Gelling Fiber Dressing, 4x5 in (silver alginate) 1 x Per Week/ ary Discharge Instructions: Apply silver alginate to wound bed as instructed Secondary Dressing: Woven Gauze Sponge, Non-Sterile 4x4 in 1 x Per Week/ Discharge Instructions: Apply over primary dressing as directed. Secondary Dressing: ABD Pad, 5x9 1 x Per Week/ Discharge Instructions: Apply over primary dressing as directed. Com pression Wrap: Kerlix Roll 4.5x3.1 (in/yd) 1 x Per Week/ Discharge Instructions: ***WRAP LIGHTLY*** Com pression Wrap: Coban Self-Adherent Wrap 4x5 (in/yd) 1 x Per Week/ Discharge Instructions: ***WRAP LIGHTLY*** WOUND #12: - Lower Leg Wound Laterality: Left, Anterior Cleanser: Soap and Water 1 x Per Week/ Discharge Instructions: May shower and wash wound with dial antibacterial soap and water prior to dressing change. Peri-Wound Care: Triamcinolone 15 (g) 1 x Per Week/ Discharge Instructions: Use triamcinolone 15 (g) as directed Peri-Wound Care: Sween Lotion (Moisturizing lotion) 1 x Per Week/ Discharge Instructions: Apply moisturizing lotion as directed Prim Dressing: KerraCel Ag Gelling Fiber Dressing, 4x5 in (silver alginate) 1 x Per Week/ ary Discharge Instructions: Apply silver alginate to wound bed as instructed Secondary Dressing: Woven Gauze Sponge, Non-Sterile 4x4 in 1 x Per Week/ Discharge Instructions: Apply over  primary dressing as directed. Secondary Dressing: ABD Pad, 5x9 1 x Per Week/ Discharge Instructions: Apply over primary dressing as directed. Com pression Wrap: Kerlix Roll 4.5x3.1 (in/yd) 1 x Per Week/ Discharge Instructions:  ***WRAP LIGHTLY*** Com pression Wrap: Coban Self-Adherent Wrap 4x5 (in/yd) 1 x Per Week/ Discharge Instructions: ***WRAP LIGHTLY*** WOUND #9: - Calcaneus Wound Laterality: Right Cleanser: Soap and Water 1 x Per Week/ Discharge Instructions: May shower and wash wound with dial antibacterial soap and water prior to dressing change. Peri-Wound Care: Triamcinolone 15 (g) 1 x Per Week/ Discharge Instructions: Use triamcinolone 15 (g) as directed Peri-Wound Care: Sween Lotion (Moisturizing lotion) 1 x Per Week/ Discharge Instructions: Apply moisturizing lotion as directed Prim Dressing: KerraCel Ag Gelling Fiber Dressing, 4x5 in (silver alginate) 1 x Per Week/ ary Discharge Instructions: Apply silver alginate to wound bed as instructed Secondary Dressing: Woven Gauze Sponge, Non-Sterile 4x4 in 1 x Per Week/ Discharge Instructions: Apply over primary dressing as directed. Secondary Dressing: ALLEVYN Heel 4 1/2in x 5 1/2in / 10.5cm x 13.5cm 1 x Per Week/ Discharge Instructions: Apply over primary dressing as directed. Com pression Wrap: Kerlix Roll 4.5x3.1 (in/yd) 1 x Per Week/ Discharge Instructions: ***WRAP LIGHTLY*** Com pression Wrap: Coban Self-Adherent Wrap 4x5 (in/yd) 1 x Per Week/ Discharge Instructions: ***WRAP LIGHTLY*** 1. I think this patient has predominantly venous insufficiency wounds on the left anterior lower leg however she also has severe PAD. It is difficult to really pin her down however she has very limited mobility related to pain and imbalance. I think she probably has an element of claudication with minimal activity. She does not complain of pain at night however 2. I think she probably has more ischemic wounds developing on the tip of her right great toe perhaps her left great toe and right heel 3. She will definitely need full arterial studies. And as opposed to 2017 there is no urgency about not giving her contrast that she is already on dialysis on [facility] in  [place]. She was referred to Dr. Kennon Holter office for full arterial studies including ABIs TBI's and arterial Dopplers 4. I am going to use silver alginate on the wounds under kerlix and light Coban. The patient will need to change the area on her toes herself. 5. I think the patient has chronic venous insufficiency as well and that may be the reason behind the area on the left anterior lower leg although I see no urgency in going ahead with venous reflux studies until we have thoroughly evaluated her arterial status I spent 45 minutes in review this patient's past medical history, face-to-face evaluation and preparation of this record Electronic Signature(s) Signed: 09/15/2020 8:19:11 AM By: Linton Ham MD Entered By: Linton Ham on 09/14/2020 12:47:24 -------------------------------------------------------------------------------- HxROS Details Patient Name: Date of Service: [name], DELO RES M. 09/14/2020 10:30 A M Medical Record Number: [id number] Patient Account Number: [id number] Date of Birth/Sex: Treating RN: 04/22/1946 (75 y.o. [name], Tammi Klippel Primary Care Provider: Benito [name] Other Clinician: Referring Provider: Treating Provider/Extender: Kathleen [name] in Treatment: 0 Information Obtained From Patient Constitutional Symptoms (General Health) Medical History: Past Medical History Notes: Meningioma, Obesity , positional vertigo , syncopeandcollapse , elevated troponin , somnolence , noncompliance , Eyes Medical History: Positive for: Cataracts - surgical removal; Glaucoma - eye drops Negative for: Optic Neuritis Ear/Nose/Mouth/Throat Medical History: Negative for: Chronic sinus problems/congestion; Middle ear problems Hematologic/Lymphatic Medical History: Positive for: Anemia - acute and chronic Negative for: Hemophilia; Human Immunodeficiency Virus; Lymphedema; Sickle  Cell Disease Past Medical History Notes: VitD  deficiency Respiratory Medical History: Positive for: Asthma Negative for: Aspiration; Chronic Obstructive Pulmonary Disease (COPD); Pneumothorax; Sleep Apnea; Tuberculosis Past Medical History Notes: acute resp. failure with hypercapnia , resp. acidosis , pulmonary vasc congestion , dyspnea , Cardiovascular Medical History: Positive for: Congestive Heart Failure - combined; Coronary Artery Disease; Hypertension; Peripheral Arterial Disease Negative for: Angina; Arrhythmia; Deep Vein Thrombosis; Hypotension; Myocardial Infarction; Peripheral Venous Disease; Phlebitis; Vasculitis Past Medical History Notes: Hyperlipidemia , (L) carotid bruit ,hypernatremia , (L) pleural effusion , Gastrointestinal Medical History: Negative for: Cirrhosis ; Colitis; Crohns; Hepatitis A; Hepatitis B; Hepatitis C Past Medical History Notes: recent extensive diverticulosis with acute rectal bleeding Endocrine Medical History: Positive for: Type II Diabetes Negative for: Type I Diabetes Time with diabetes: 40 + yrs Treated with: Insulin Blood sugar tested every day: No Blood sugar testing results: Breakfast: 80-90; Bedtime: 130 Genitourinary Medical History: Negative for: End Stage Renal Disease Past Medical History Notes: Chronic Kidney Disease stage IV , ARF , Immunological Medical History: Negative for: Lupus Erythematosus; Raynauds; Scleroderma Integumentary (Skin) Medical History: Negative for: History of Burn Past Medical History Notes: cellulitis and abscess RLE Musculoskeletal Medical History: Positive for: Osteoarthritis Negative for: Gout; Rheumatoid Arthritis; Osteomyelitis Neurologic Medical History: Negative for: Dementia; Neuropathy; Quadriplegia; Paraplegia; Seizure Disorder Oncologic Medical History: Negative for: Received Chemotherapy; Received Radiation Psychiatric Medical History: Negative for: Anorexia/bulimia; Confinement Anxiety HBO Extended History  Items Eyes: Eyes: Cataracts Glaucoma Immunizations Pneumococcal Vaccine: Received Pneumococcal Vaccination: Yes Immunization Notes: unknown date Implantable Devices No devices added Hospitalization / Surgery History Type of Hospitalization/Surgery GI Bleed tonsillectomy appendectomy Family and Social History Cancer: Yes - Siblings; Diabetes: Yes - Mother,Siblings,Maternal Grandparents; Heart Disease: Yes - Mother; Hereditary Spherocytosis: No; Hypertension: Yes - Mother,Father,Siblings; Kidney Disease: Yes - Mother; Lung Disease: No; Seizures: No; Stroke: Yes - Mother; Thyroid Problems: Yes - Child; Tuberculosis: No; Former smoker - quit 10 yr ago; Marital Status - Divorced; Alcohol Use: Never; Drug Use: No History; Caffeine Use: Daily - soda , tea; Financial Concerns: No; Food, Clothing or Shelter Needs: No; Support System Lacking: No; Transportation Concerns: No Electronic Signature(s) Signed: 09/14/2020 4:13:29 PM By: Deon Pilling Signed: 09/15/2020 8:19:11 AM By: Linton Ham MD Entered By: Deon Pilling on 09/14/2020 10:51:26 -------------------------------------------------------------------------------- SuperBill Details Patient Name: Date of Service: Kathleen [name] RES M. 09/14/2020 Medical Record Number: [id number] Patient Account Number: [id number] Date of Birth/Sex: Treating RN: [birth year] (75 y.o. Kathleen [name] Primary Care Provider: Benito [name] Other Clinician: Referring Provider: Treating Provider/Extender: Kathleen [name] in Treatment: 0 Diagnosis Coding ICD-10 Codes Code Description E11.51 Type 2 diabetes mellitus with diabetic peripheral angiopathy without gangrene I87.323 Chronic venous hypertension (idiopathic) with inflammation of bilateral lower extremity L97.411 Non-pressure chronic ulcer of right heel and midfoot limited to breakdown of skin L97.521 Non-pressure chronic ulcer of other part of left foot limited to  breakdown of skin L97.822 Non-pressure chronic ulcer of other part of left lower leg with fat layer exposed E11.42 Type 2 diabetes mellitus with diabetic polyneuropathy Facility Procedures CPT4 Code: 93810175 Description: 99214 - WOUND CARE VISIT-LEV 4 EST PT Modifier: Quantity: 1 Physician Procedures : CPT4 Code Description Modifier [id number] - WC PHYS LEVEL 4 - NEW PT ICD-10 Diagnosis Description E11.51 Type 2 diabetes mellitus with diabetic peripheral angiopathy without gangrene L97.411 Non-pressure chronic ulcer of right heel and midfoot  limited to breakdown of skin I87.323 Chronic venous hypertension (idiopathic) with inflammation of bilateral lower extremity L97.521 Non-pressure chronic ulcer  of other part of left foot limited to breakdown of skin Quantity: 1 Electronic Signature(s) Signed: 09/15/2020 8:19:11 AM By: Linton Ham MD Entered By: Linton Ham on 09/14/2020 12:48:00

## 2020-09-19 DIAGNOSIS — N2581 Secondary hyperparathyroidism of renal origin: Secondary | ICD-10-CM | POA: Diagnosis not present

## 2020-09-19 DIAGNOSIS — Z992 Dependence on renal dialysis: Secondary | ICD-10-CM | POA: Diagnosis not present

## 2020-09-19 DIAGNOSIS — N186 End stage renal disease: Secondary | ICD-10-CM | POA: Diagnosis not present

## 2020-09-21 ENCOUNTER — Other Ambulatory Visit: Payer: Self-pay

## 2020-09-21 ENCOUNTER — Encounter (HOSPITAL_BASED_OUTPATIENT_CLINIC_OR_DEPARTMENT_OTHER): Payer: Medicare HMO | Admitting: Internal Medicine

## 2020-09-21 DIAGNOSIS — L97521 Non-pressure chronic ulcer of other part of left foot limited to breakdown of skin: Secondary | ICD-10-CM | POA: Diagnosis not present

## 2020-09-21 DIAGNOSIS — E11621 Type 2 diabetes mellitus with foot ulcer: Secondary | ICD-10-CM | POA: Diagnosis not present

## 2020-09-21 DIAGNOSIS — L97411 Non-pressure chronic ulcer of right heel and midfoot limited to breakdown of skin: Secondary | ICD-10-CM | POA: Diagnosis not present

## 2020-09-21 DIAGNOSIS — E1122 Type 2 diabetes mellitus with diabetic chronic kidney disease: Secondary | ICD-10-CM | POA: Diagnosis not present

## 2020-09-21 DIAGNOSIS — E1142 Type 2 diabetes mellitus with diabetic polyneuropathy: Secondary | ICD-10-CM | POA: Diagnosis not present

## 2020-09-21 DIAGNOSIS — E1151 Type 2 diabetes mellitus with diabetic peripheral angiopathy without gangrene: Secondary | ICD-10-CM | POA: Diagnosis not present

## 2020-09-21 DIAGNOSIS — I87323 Chronic venous hypertension (idiopathic) with inflammation of bilateral lower extremity: Secondary | ICD-10-CM | POA: Diagnosis not present

## 2020-09-21 DIAGNOSIS — L97822 Non-pressure chronic ulcer of other part of left lower leg with fat layer exposed: Secondary | ICD-10-CM | POA: Diagnosis not present

## 2020-09-21 DIAGNOSIS — N184 Chronic kidney disease, stage 4 (severe): Secondary | ICD-10-CM | POA: Diagnosis not present

## 2020-09-21 NOTE — Progress Notes (Signed)
MARIANE, BURPEE ([id number]) Visit Report for 09/21/2020 Debridement Details Patient Name: Date of Service: Kathleen [name] RES M. 09/21/2020 8:30 A M Medical Record Number: [id number] Patient Account Number: [id number] Date of Birth/Sex: Treating RN: 08/09/1945 (75 y.o. Kathleen [name] Primary Care Provider: Benito Mccreedy Other Clinician: Referring Provider: Treating Provider/Extender: Loma Sender in Treatment: 1 Debridement Performed for Assessment: Wound #9 Right Calcaneus Performed By: Clinician Levan Hurst, RN Debridement Type: Chemical/Enzymatic/Mechanical Agent Used: Anasept and gauze Severity of Tissue Pre Debridement: Fat layer exposed Level of Consciousness (Pre-procedure): Awake and Alert Pre-procedure Verification/Time Out Yes - 09:28 Taken: Start Time: 09:28 Bleeding: None Response to Treatment: Procedure was tolerated well Level of Consciousness (Post- Awake and Alert procedure): Post Debridement Measurements of Total Wound Length: (cm) 2 Width: (cm) 0.5 Depth: (cm) 0.1 Volume: (cm) 0.079 Character of Wound/Ulcer Post Debridement: Improved Severity of Tissue Post Debridement: Fat layer exposed Post Procedure Diagnosis Same as Pre-procedure Electronic Signature(s) Signed: 09/21/2020 5:50:50 PM By: Levan Hurst RN, BSN Signed: 09/21/2020 5:51:45 PM By: Linton Ham MD Entered By: Levan Hurst on 09/21/2020 09:28:07 -------------------------------------------------------------------------------- HPI Details Patient Name: Date of Service: Kathleen [name], [place]. 09/21/2020 8:30 A M Medical Record Number: [id number] Patient Account Number: [id number] Date of Birth/Sex: Treating RN: 05-24-1946 (75 y.o. Kathleen [name] Primary Care Provider: Benito Mccreedy Other Clinician: Referring Provider: Treating Provider/Extender: Loma Sender in Treatment: 1 History of Present Illness HPI Description:  05/14/15; this is a 75 year old woman with a history of diabetes who was referred here I think after being seen in the emergency room on 9/29 with sudden edema of the right leg and a large resultant blister on the posterior medial part of the right leg. She had a previous blister about a week before her arrival there however this it open by the time she came to the emergency room. She has no history of known PAD, prior wound. She does have a history of a cardiomyopathy with her last echo on 10/15 showing an EF of 50-55%. She was given clindamycin orally and she is about to complete these in 2 days. She was felt to have cellulitis of the right lower extremity. 05/22/15; her duplex ultrasound was negative for DVT The Kerlix Coban appears to be controlling her edema. The substantial wound area on the right posterior . medial leg looks to be improved with epithelialization. She still has a reasonably substantial open area however I think this is going to heal there is no evidence of infection. Her peripheral pulses are not palpable in this foot. I think this lady probably has some degree of PAD as well. interestingly, she comes in with a crop of blisters just below her knee. These are itchy and if they have been more dermatomal I might of considered zoster. However I think this is probably some form of contact dermatitis question Coban oo 05/28/15. The area on the medial aspect of her right leg continues to be improved in terms of advancing epithelialization. There is no evidence of infection. She probably has some degree of PAD. The contact dermatitis last blisters in the top of her right leg from last week are resolved. Finally she on the left leg she has a large blister which I opened this was flaccid she has edema in the left leg and I think this is the cause 06/05/15. Her right leg wound is completely epithelialized. There is no open wounds on her right leg. I had to remove the  remaining skin from the  blister on her left leg however this looks superficial and should be resolved next week. It also looks like she has some friction injury on the top of her right anterior first metatarsal head. I've advised to protect this in her shoes 06/11/15; she has now to open areas one posteriorly on the left leg and one anteriorly on the left. Still poorly controlled edema and may be developing a small additional blister on the right leg. None of this looks to have infection.she is obtained juxtalite stockings at home but she did not bring them today 06/19/15; patient returns today all of her wounds are closed. She has significant venous insufficiency with inflammation right greater than left considerable hemosiderin deposition on the right. We are able to discharge her in her juxtalite stockings. I gave her instructions for lubricating the skin her lower legs twice a day READMISSION; 02/05/16; this is a patient we had in this clinic from 10/7 through 11/4 2016. At that point I felt she had coexistent venous insufficiency and edema and PAD. She presented with a blister and cellulitis on the medial right leg which ultimately form the wound which progressively epithelialized over several weeks and then resolved. During her stay here she does not seem to pad noninvasive arterial studies. She also developed large flaccid blisters on her left leg that formed wounds as well. We ultimately discharged. Juxtalite stockings to control the swelling. The patient is a type II diabetic on insulin. He would appear that she has fairly significant chronic renal failure with the last creatinine on 4/22,017 at 3.36. Estimated GFR of 13. Last hemoglobin A1c of 9.0 in April 2017 the patient was seen by her primary physician Dr. Iona Beard Osei-Bonsu. He noted what sounds like cellulitis on the posterior left leg and a clean 6 cm x 5 cm ulcer with surrounding skin redness. She was put on antibiotics. The patient states she has not had any  wounds on her legs since she was here in the fall. I'm not exactly sure how she treated the ulcer in her primary physician's office however she arrived with no ulcer on the left leg only eschar that peeled off to reveal normal skin. It is fairly clear that the patient is not wearing her juxtalite stockings. She is not describing shortness breath or chest pain but does describe heaviness in her legs when she walks forcing her to stop and rest. In this clinic her ABI on the right was 0.65 and on the left 0.54. 02/19/16; the patient arrived with a small hemorrhagic blister on the left anterior leg. There is nothing on the right. She's been to see Dr. Gwenlyn Found the formal studies showed an ABI in the right of 0.64 on the left of 0.59. TBI in the right 0.48 0.49 on the left. She had bilateral common femoral artery waves were triphasic. Bilateral popliteal peroneal anterior and posterior tibial arteries were monophasic. She has a follow-up with Dr. Gwenlyn Found next week 02/26/16; left leg blisters healed however she has a right leg blister this was excised with pickups and scissors. She's been to see Dr. Gwenlyn Found who notes her chronic renal failure, the reasonably unimpressive nature of her wounds and did not want to proceed with angiography. He will see her in 3 months 03/04/16; left leg blisters have healed however she did not come in with her juxtalite stockings. She has been to see Dr. Gwenlyn Found did not want to proceed with angiography. Follow-up in 3 months. She developed  a blister on the right last week we excised it. Dimensions are better 09/29/16- the patient made an appointment for a large blister on the anterior aspect of her right lower extremity since making the appointment the blister deroofed and has completely healed and epithelialized. She has not been using her juxta lites that were prescribed nor has she been wearing compression stockings. READMISSION 09/14/2020 This is a patient that we had in the clinic in  2016 and 17. She was last here in February 2018. These were largely for what was felt to be chronic venous insufficiency wounds on her bilateral lower extremities. At one point she had bilateral juxta lite stockings. She was also noted to have diabetic angiopathy with PAD. She went to see Dr. Gwenlyn Found but because the wounds were predominantly venous and she was in stage IV chronic renal failure nothing further was done about her PAD. She comes back in clinic today with wounds on her right heel, right first toe, left first toe and 3 wounds on the left anterior lower extremity. She is complaining of a lot of pain in the left foot, right heel. She is not a smoker. Past medical history includes coronary artery disease with ischemic cardiomyopathy, type 2 diabetes, coronary artery disease, osteoarthritis, hypertension, chronic renal failure now on dialysis, hyperlipidemia, asthma ABIs in our clinic today were 0.5 on the right and 0.55 on the left 2/14; we do not have an appointment for vascular studies and we continue to work on this. She has areas on her left anterior lower leg which look mostly like venous wounds. She has areas on the right first toe tips right fourth toe tip and right heel. She is complaining a lot of pain however it is difficult to really determine how much of the pain she experiences is secondary to claudication. We wrapped both legs last time however it looks like she has wrap injury on the upper medial right leg with blistering and excoriations. Electronic Signature(s) Signed: 09/21/2020 5:51:45 PM By: Linton Ham MD Entered By: Linton Ham on 09/21/2020 09:33:26 -------------------------------------------------------------------------------- Physical Exam Details Patient Name: Date of Service: Kathleen [name] RES M. 09/21/2020 8:30 A M Medical Record Number: [id number] Patient Account Number: [id number] Date of Birth/Sex: Treating RN: Jan 23, 1946 (75 y.o. Kathleen [name] Primary Care Provider: Benito Mccreedy Other Clinician: Referring Provider: Treating Provider/Extender: Loma Sender in Treatment: 1 Constitutional Patient is hypertensive.. Pulse regular and within target range for patient.[name] Respirations regular, non-labored and within target range.. Temperature is normal and within the target range for the patient.[name] Appears in no distress. Cardiovascular Pedal pulses absent bilaterally. I cannot feel popliteal pulses on either side. Notes Wound exam Left anterior lower leg still 3 wounds here. There is superficial look like venous wounds. On the right she has a small area on the right fourth and first toes things are much different from last week neither is the right heel. She does however have blistering on the right medial upper calf and looks like some wrap injury. Electronic Signature(s) Signed: 09/21/2020 5:51:45 PM By: Linton Ham MD Entered By: Linton Ham on 09/21/2020 09:34:41 -------------------------------------------------------------------------------- Physician Orders Details Patient Name: Date of Service: Kathleen [name], DEL[place] RES M. 09/21/2020 8:30 A M Medical Record Number: [id number] Patient Account Number: [id number] Date of Birth/Sex: Treating RN: May 19, 1946 (75 y.o. Kathleen [name] Primary Care Provider: Benito Mccreedy Other Clinician: Referring Provider: Treating Provider/Extender: Loma Sender in Treatment: 1 Verbal / Phone Orders: No Diagnosis Coding  ICD-10 Coding Code Description E11.51 Type 2 diabetes mellitus with diabetic peripheral angiopathy without gangrene I87.323 Chronic venous hypertension (idiopathic) with inflammation of bilateral lower extremity L97.411 Non-pressure chronic ulcer of right heel and midfoot limited to breakdown of skin L97.521 Non-pressure chronic ulcer of other part of left foot limited to breakdown of skin L97.822  Non-pressure chronic ulcer of other part of left lower leg with fat layer exposed E11.42 Type 2 diabetes mellitus with diabetic polyneuropathy Follow-up Appointments Return Appointment in 1 week. Bathing/ Shower/ Hygiene May shower with protection but do not get wound dressing(s) wet. Edema Control - Lymphedema / SCD / Other Elevate legs to the level of the heart or above for 30 minutes daily and/or when sitting, a frequency of: - throughout the day Avoid standing for long periods of time. Exercise regularly Wound Treatment Wound #10 - T Great oe Wound Laterality: Left Cleanser: Normal Saline Every Other Day/7 Days Discharge Instructions: Cleanse the wound with Normal Saline or soap and water prior to applying a clean dressing using gauze sponges, not tissue or cotton balls. Peri-Wound Care: Triamcinolone 15 (g) Every Other Day/7 Days Discharge Instructions: Use triamcinolone 15 (g) as directed Peri-Wound Care: Sween Lotion (Moisturizing lotion) Every Other Day/7 Days Discharge Instructions: Apply moisturizing lotion as directed Prim Dressing: KerraCel Ag Gelling Fiber Dressing, 2x2 in (silver alginate) Every Other Day/7 Days ary Discharge Instructions: Apply silver alginate to wound bed as instructed Secondary Dressing: Woven Gauze Sponges 2x2 in Every Other Day/7 Days Discharge Instructions: Apply over primary dressing as directed. Secured With: Child psychotherapist, Sterile 2x75 (in/in) Every Other Day/7 Days Discharge Instructions: Secure with stretch gauze as directed. Secured With: Paper Tape, 1x10 (in/yd) Every Other Day/7 Days Discharge Instructions: Secure dressing with tape as directed. Wound #11 - Lower Leg Wound Laterality: Left, Medial Cleanser: Soap and Water 1 x Per Week Discharge Instructions: May shower and wash wound with dial antibacterial soap and water prior to dressing change. Peri-Wound Care: Triamcinolone 15 (g) 1 x Per Week Discharge Instructions:  Use triamcinolone 15 (g) as directed Peri-Wound Care: Sween Lotion (Moisturizing lotion) 1 x Per Week Discharge Instructions: Apply moisturizing lotion as directed Prim Dressing: KerraCel Ag Gelling Fiber Dressing, 4x5 in (silver alginate) 1 x Per Week ary Discharge Instructions: Apply silver alginate to wound bed as instructed Secondary Dressing: Woven Gauze Sponge, Non-Sterile 4x4 in 1 x Per Week Discharge Instructions: Apply over primary dressing as directed. Secondary Dressing: ABD Pad, 5x9 1 x Per Week Discharge Instructions: Apply over primary dressing as directed. Compression Wrap: Kerlix Roll 4.5x3.1 (in/yd) 1 x Per Week Discharge Instructions: ***WRAP LIGHTLY*** Compression Wrap: Coban Self-Adherent Wrap 4x5 (in/yd) 1 x Per Week Discharge Instructions: ***WRAP LIGHTLY*** Wound #12 - Lower Leg Wound Laterality: Left, Anterior Cleanser: Soap and Water 1 x Per Week Discharge Instructions: May shower and wash wound with dial antibacterial soap and water prior to dressing change. Peri-Wound Care: Triamcinolone 15 (g) 1 x Per Week Discharge Instructions: Use triamcinolone 15 (g) as directed Peri-Wound Care: Sween Lotion (Moisturizing lotion) 1 x Per Week Discharge Instructions: Apply moisturizing lotion as directed Prim Dressing: KerraCel Ag Gelling Fiber Dressing, 4x5 in (silver alginate) 1 x Per Week ary Discharge Instructions: Apply silver alginate to wound bed as instructed Secondary Dressing: Woven Gauze Sponge, Non-Sterile 4x4 in 1 x Per Week Discharge Instructions: Apply over primary dressing as directed. Secondary Dressing: ABD Pad, 5x9 1 x Per Week Discharge Instructions: Apply over primary dressing as directed. Compression Wrap: Kerlix Roll 4.5x3.1 (  in/yd) 1 x Per Week Discharge Instructions: ***WRAP LIGHTLY*** Compression Wrap: Coban Self-Adherent Wrap 4x5 (in/yd) 1 x Per Week Discharge Instructions: ***WRAP LIGHTLY*** Wound #9 - Calcaneus Wound Laterality:  Right Cleanser: Soap and Water Every Other Day/7 Days Discharge Instructions: May shower and wash wound with dial antibacterial soap and water prior to dressing change. Cleanser: [employer] Ancillary Kit - 15 Day Supply (DME) (Generic) Every Other Day/7 Days Discharge Instructions: Use supplies as instructed; Kit contains: (15) Saline Bullets; (15) 3x3 Gauze; 15 pr Gloves Peri-Wound Care: Triamcinolone 15 (g) Every Other Day/7 Days Discharge Instructions: Use triamcinolone 15 (g) as directed Peri-Wound Care: Sween Lotion (Moisturizing lotion) Every Other Day/7 Days Discharge Instructions: Apply moisturizing lotion as directed Prim Dressing: KerraCel Ag Gelling Fiber Dressing, 2x2 in (silver alginate) (DME) (Generic) Every Other Day/7 Days ary Discharge Instructions: Apply silver alginate to wound bed as instructed Secondary Dressing: Woven Gauze Sponge, Non-Sterile 4x4 in (DME) (Generic) Every Other Day/7 Days Discharge Instructions: Apply over primary dressing as directed. Secondary Dressing: ALLEVYN Heel 4 1/2in x 5 1/2in / 10.5cm x 13.5cm Every Other Day/7 Days Discharge Instructions: Apply over primary dressing as directed. Secured With: [employer], 4.5x3.1 (in/yd) (DME) (Generic) Every Other Day/7 Days Discharge Instructions: Secure with Kerlix as directed. Secured With: Paper Tape, 2x10 (in/yd) (DME) (Generic) Every Other Day/7 Days Discharge Instructions: Secure dressing with tape as directed. Electronic Signature(s) Signed: 09/21/2020 5:50:50 PM By: Levan Hurst RN, BSN Signed: 09/21/2020 5:51:45 PM By: Linton Ham MD Entered By: Levan Hurst on 09/21/2020 09:30:39 -------------------------------------------------------------------------------- Problem List Details Patient Name: Date of Service: Kathleen [name], DELO RES M. 09/21/2020 8:30 A M Medical Record Number: [id number] Patient Account Number: [id number] Date of Birth/Sex: Treating RN: [birth year] (75 y.o. Kathleen [name] Primary Care Provider: Benito Mccreedy Other Clinician: Referring Provider: Treating Provider/Extender: Loma Sender in Treatment: 1 Active Problems ICD-10 Encounter Code Description Active Date MDM Diagnosis E11.51 Type 2 diabetes mellitus with diabetic peripheral angiopathy without gangrene 09/14/2020 No Yes I87.323 Chronic venous hypertension (idiopathic) with inflammation of bilateral lower 09/14/2020 No Yes extremity L97.411 Non-pressure chronic ulcer of right heel and midfoot limited to breakdown of 09/14/2020 No Yes skin L97.521 Non-pressure chronic ulcer of other part of left foot limited to breakdown of 09/14/2020 No Yes skin L97.822 Non-pressure chronic ulcer of other part of left lower leg with fat layer exposed2/02/2021 No Yes E11.42 Type 2 diabetes mellitus with diabetic polyneuropathy 09/14/2020 No Yes Inactive Problems Resolved Problems Electronic Signature(s) Signed: 09/21/2020 5:51:45 PM By: Linton Ham MD Entered By: Linton Ham on 09/21/2020 09:31:15 -------------------------------------------------------------------------------- Progress Note Details Patient Name: Date of Service: Kathleen [name], [place] RES M. 09/21/2020 8:30 A M Medical Record Number: [id number] Patient Account Number: [id number] Date of Birth/Sex: Treating RN: 1946/07/25 (75 y.o. Kathleen [name] Primary Care Provider: Benito Mccreedy Other Clinician: Referring Provider: Treating Provider/Extender: Loma Sender in Treatment: 1 Subjective History of Present Illness (HPI) 05/14/15; this is a 75 year old woman with a history of diabetes who was referred here I think after being seen in the emergency room on 9/29 with sudden edema of the right leg and a large resultant blister on the posterior medial part of the right leg. She had a previous blister about a week before her arrival there however this it open by the time she came to the  emergency room. She has no history of known PAD, prior wound. She does have a history of a cardiomyopathy with her last echo on 10/15  showing an EF of 50-55%. She was given clindamycin orally and she is about to complete these in 2 days. She was felt to have cellulitis of the right lower extremity. 05/22/15; her duplex ultrasound was negative for DVT The Kerlix Coban appears to be controlling her edema. The substantial wound area on the right posterior . medial leg looks to be improved with epithelialization. She still has a reasonably substantial open area however I think this is going to heal there is no evidence of infection. Her peripheral pulses are not palpable in this foot. I think this lady probably has some degree of PAD as well. interestingly, she comes in with a crop of blisters just below her knee. These are itchy and if they have been more dermatomal I might of considered zoster. However I think this is probably some form of contact dermatitis question Coban oo 05/28/15. The area on the medial aspect of her right leg continues to be improved in terms of advancing epithelialization. There is no evidence of infection. She probably has some degree of PAD. The contact dermatitis last blisters in the top of her right leg from last week are resolved. Finally she on the left leg she has a large blister which I opened this was flaccid she has edema in the left leg and I think this is the cause 06/05/15. Her right leg wound is completely epithelialized. There is no open wounds on her right leg. I had to remove the remaining skin from the blister on her left leg however this looks superficial and should be resolved next week. It also looks like she has some friction injury on the top of her right anterior first metatarsal head. I've advised to protect this in her shoes 06/11/15; she has now to open areas one posteriorly on the left leg and one anteriorly on the left. Still poorly controlled edema  and may be developing a small additional blister on the right leg. None of this looks to have infection.she is obtained juxtalite stockings at home but she did not bring them today 06/19/15; patient returns today all of her wounds are closed. She has significant venous insufficiency with inflammation right greater than left considerable hemosiderin deposition on the right. We are able to discharge her in her juxtalite stockings. I gave her instructions for lubricating the skin her lower legs twice a day READMISSION; 02/05/16; this is a patient we had in this clinic from 10/7 through 11/4 2016. At that point I felt she had coexistent venous insufficiency and edema and PAD. She presented with a blister and cellulitis on the medial right leg which ultimately form the wound which progressively epithelialized over several weeks and then resolved. During her stay here she does not seem to pad noninvasive arterial studies. She also developed large flaccid blisters on her left leg that formed wounds as well. We ultimately discharged. Juxtalite stockings to control the swelling. The patient is a type II diabetic on insulin. He would appear that she has fairly significant chronic renal failure with the last creatinine on 4/22,017 at 3.36. Estimated GFR of 13. Last hemoglobin A1c of 9.0 in April 2017 the patient was seen by her primary physician Dr. Iona Beard Osei-Bonsu. He noted what sounds like cellulitis on the posterior left leg and a clean 6 cm x 5 cm ulcer with surrounding skin redness. She was put on antibiotics. The patient states she has not had any wounds on her legs since she was here in the fall. I'm not  exactly sure how she treated the ulcer in her primary physician's office however she arrived with no ulcer on the left leg only eschar that peeled off to reveal normal skin. It is fairly clear that the patient is not wearing her juxtalite stockings. She is not describing shortness breath or chest pain but  does describe heaviness in her legs when she walks forcing her to stop and rest. In this clinic her ABI on the right was 0.65 and on the left 0.54. 02/19/16; the patient arrived with a small hemorrhagic blister on the left anterior leg. There is nothing on the right. She's been to see Dr. Gwenlyn Found the formal studies showed an ABI in the right of 0.64 on the left of 0.59. TBI in the right 0.48 0.49 on the left. She had bilateral common femoral artery waves were triphasic. Bilateral popliteal peroneal anterior and posterior tibial arteries were monophasic. She has a follow-up with Dr. Gwenlyn Found next week 02/26/16; left leg blisters healed however she has a right leg blister this was excised with pickups and scissors. She's been to see Dr. Gwenlyn Found who notes her chronic renal failure, the reasonably unimpressive nature of her wounds and did not want to proceed with angiography. He will see her in 3 months 03/04/16; left leg blisters have healed however she did not come in with her juxtalite stockings. She has been to see Dr. Gwenlyn Found did not want to proceed with angiography. Follow-up in 3 months. She developed a blister on the right last week we excised it. Dimensions are better 09/29/16- the patient made an appointment for a large blister on the anterior aspect of her right lower extremity since making the appointment the blister deroofed and has completely healed and epithelialized. She has not been using her juxta lites that were prescribed nor has she been wearing compression stockings. READMISSION 09/14/2020 This is a patient that we had in the clinic in 2016 and 17. She was last here in February 2018. These were largely for what was felt to be chronic venous insufficiency wounds on her bilateral lower extremities. At one point she had bilateral juxta lite stockings. She was also noted to have diabetic angiopathy with PAD. She went to see Dr. Gwenlyn Found but because the wounds were predominantly venous and she was in  stage IV chronic renal failure nothing further was done about her PAD. She comes back in clinic today with wounds on her right heel, right first toe, left first toe and 3 wounds on the left anterior lower extremity. She is complaining of a lot of pain in the left foot, right heel. She is not a smoker. Past medical history includes coronary artery disease with ischemic cardiomyopathy, type 2 diabetes, coronary artery disease, osteoarthritis, hypertension, chronic renal failure now on dialysis, hyperlipidemia, asthma ABIs in our clinic today were 0.5 on the right and 0.55 on the left 2/14; we do not have an appointment for vascular studies and we continue to work on this. She has areas on her left anterior lower leg which look mostly like venous wounds. She has areas on the right first toe tips right fourth toe tip and right heel. She is complaining a lot of pain however it is difficult to really determine how much of the pain she experiences is secondary to claudication. We wrapped both legs last time however it looks like she has wrap injury on the upper medial right leg with blistering and excoriations. Objective Constitutional Patient is hypertensive.. Pulse regular and within target  range for patient.[name] Respirations regular, non-labored and within target range.. Temperature is normal and within the target range for the patient.[name] Appears in no distress. Vitals Time Taken: 8:51 AM, Height: 63 in, Weight: 187 lbs, BMI: 33.1, Temperature: 97.6 F, Pulse: 90 bpm, Respiratory Rate: 17 breaths/min, Blood Pressure: 146/79 mmHg. Cardiovascular Pedal pulses absent bilaterally. I cannot feel popliteal pulses on either side. General Notes: Wound exam ooLeft anterior lower leg still 3 wounds here. There is superficial look like venous wounds. ooOn the right she has a small area on the right fourth and first toes things are much different from last week neither is the right heel. She does however have  blistering on the right medial upper calf and looks like some wrap injury. Integumentary (Hair, Skin) Wound #10 status is Open. Original cause of wound was Blister. The wound is located on the Left T Great. The wound measures 0.7cm length x 0.5cm width oe x 0.1cm depth; 0.275cm^2 area and 0.027cm^3 volume. There is Fat Layer (Subcutaneous Tissue) exposed. There is no tunneling or undermining noted. There is a medium amount of serosanguineous drainage noted. The wound margin is distinct with the outline attached to the wound base. There is large (67-100%) red granulation within the wound bed. There is no necrotic tissue within the wound bed. Wound #11 status is Open. Original cause of wound was Blister. The wound is located on the Left,Medial Lower Leg. The wound measures 3.5cm length x 2.7cm width x 0.1cm depth; 7.422cm^2 area and 0.742cm^3 volume. There is Fat Layer (Subcutaneous Tissue) exposed. There is no tunneling or undermining noted. There is a medium amount of serosanguineous drainage noted. The wound margin is distinct with the outline attached to the wound base. There is large (67- 100%) pink, pale granulation within the wound bed. There is no necrotic tissue within the wound bed. Wound #12 status is Open. Original cause of wound was Blister. The wound is located on the Left,Anterior Lower Leg. The wound measures 0.7cm length x 0.5cm width x 0.1cm depth; 0.275cm^2 area and 0.027cm^3 volume. There is Fat Layer (Subcutaneous Tissue) exposed. There is no tunneling or undermining noted. There is a medium amount of serosanguineous drainage noted. The wound margin is distinct with the outline attached to the wound base. There is large (67-100%) pink, pale granulation within the wound bed. There is no necrotic tissue within the wound bed. Wound #9 status is Open. Original cause of wound was Blister. The wound is located on the Right Calcaneus. The wound measures 2cm length x 0.5cm width x 0.1cm  depth; 0.785cm^2 area and 0.079cm^3 volume. There is Fat Layer (Subcutaneous Tissue) exposed. There is no tunneling or undermining noted. There is a medium amount of serosanguineous drainage noted. The wound margin is distinct with the outline attached to the wound base. There is large (67-100%) pink granulation within the wound bed. There is no necrotic tissue within the wound bed. 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 There was a Chemical/Enzymatic/Mechanical debridement performed by Levan Hurst, RN.. Other agent used was Anasept and gauze. A time out was conducted at 09:28, prior to the start of the procedure. There was no bleeding. The procedure was tolerated well. Post Debridement Measurements: 2cm length x 0.5cm width x 0.1cm depth; 0.079cm^3 volume. Character of Wound/Ulcer Post Debridement is improved. Severity of Tissue Post Debridement is: Fat layer exposed. Post procedure Diagnosis Wound #9: Same as Pre-Procedure Plan Follow-up Appointments: Return Appointment in 1 week. Bathing/ Shower/ Hygiene: May shower with protection but do not get wound dressing(s) wet. Edema Control - Lymphedema / SCD / Other: Elevate legs to the level of the heart or above for 30 minutes daily and/or when sitting, a frequency of: - throughout the  day Avoid standing for long periods of time. Exercise regularly WOUND #10: - T Great Wound Laterality: Left oe Cleanser: Normal Saline Every Other Day/7 Days Discharge Instructions: Cleanse the wound with Normal Saline or soap and water prior to applying a clean dressing using gauze sponges, not tissue or cotton balls. Peri-Wound Care: Triamcinolone 15 (g) Every Other Day/7 Days Discharge Instructions: Use triamcinolone 15 (g) as directed Peri-Wound Care: Sween Lotion (Moisturizing lotion) Every Other Day/7 Days Discharge Instructions: Apply moisturizing lotion as directed Prim Dressing: KerraCel Ag Gelling Fiber Dressing, 2x2 in (silver alginate) Every Other Day/7 Days ary Discharge Instructions: Apply silver alginate to wound bed as instructed Secondary Dressing: Woven Gauze Sponges 2x2 in Every Other Day/7 Days Discharge Instructions: Apply over primary dressing as directed. Secured With: Child psychotherapist, Sterile 2x75 (in/in) Every Other Day/7 Days Discharge Instructions: Secure with stretch gauze as directed. Secured With: Paper T ape, 1x10 (in/yd) Every Other Day/7 Days Discharge Instructions: Secure dressing with tape as directed. WOUND #11: - Lower Leg Wound Laterality: Left, Medial Cleanser: Soap and Water 1 x Per Week/ Discharge Instructions: May shower and wash wound with dial antibacterial soap and water prior to dressing change. Peri-Wound Care: Triamcinolone 15 (g) 1 x Per Week/ Discharge Instructions: Use triamcinolone 15 (g) as directed Peri-Wound Care: Sween Lotion (Moisturizing lotion) 1 x Per Week/ Discharge Instructions: Apply moisturizing lotion as directed Prim Dressing: KerraCel Ag Gelling Fiber Dressing, 4x5 in (silver alginate) 1 x Per Week/ ary Discharge Instructions: Apply silver alginate to wound bed as instructed Secondary Dressing: Woven Gauze Sponge, Non-Sterile 4x4 in 1 x Per Week/ Discharge Instructions: Apply over primary dressing as  directed. Secondary Dressing: ABD Pad, 5x9 1 x Per Week/ Discharge Instructions: Apply over primary dressing as directed. Com pression Wrap: Kerlix Roll 4.5x3.1 (in/yd) 1 x Per Week/ Discharge Instructions: ***WRAP LIGHTLY*** Com pression Wrap: Coban Self-Adherent Wrap 4x5 (in/yd) 1 x Per Week/ Discharge Instructions: ***WRAP LIGHTLY*** WOUND #12: - Lower Leg Wound Laterality: Left, Anterior Cleanser: Soap and Water 1 x Per Week/ Discharge Instructions: May shower and wash wound with dial antibacterial soap and water prior to dressing change. Peri-Wound Care: Triamcinolone 15 (g) 1 x Per Week/ Discharge Instructions: Use triamcinolone 15 (g) as directed Peri-Wound Care: Sween Lotion (Moisturizing lotion) 1 x Per Week/ Discharge Instructions: Apply moisturizing lotion as directed Prim Dressing: KerraCel Ag Gelling Fiber Dressing, 4x5 in (silver alginate) 1 x Per Week/ ary Discharge Instructions: Apply silver alginate to wound bed as instructed Secondary Dressing: Woven Gauze Sponge, Non-Sterile 4x4 in 1 x Per Week/ Discharge Instructions: Apply over primary dressing as directed. Secondary Dressing: ABD Pad, 5x9 1 x Per Week/ Discharge Instructions: Apply over primary dressing as directed. Com pression  Wrap: Kerlix Roll 4.5x3.1 (in/yd) 1 x Per Week/ Discharge Instructions: ***WRAP LIGHTLY*** Com pression Wrap: Coban Self-Adherent Wrap 4x5 (in/yd) 1 x Per Week/ Discharge Instructions: ***WRAP LIGHTLY*** WOUND #9: - Calcaneus Wound Laterality: Right Cleanser: Soap and Water Every Other Day/7 Days Discharge Instructions: May shower and wash wound with dial antibacterial soap and water prior to dressing change. Cleanser: [employer] Ancillary Kit - 15 Day Supply (DME) (Generic) Every Other Day/7 Days Discharge Instructions: Use supplies as instructed; Kit contains: (15) Saline Bullets; (15) 3x3 Gauze; 15 pr Gloves Peri-Wound Care: Triamcinolone 15 (g) Every Other Day/7 Days Discharge Instructions:  Use triamcinolone 15 (g) as directed Peri-Wound Care: Sween Lotion (Moisturizing lotion) Every Other Day/7 Days Discharge Instructions: Apply moisturizing lotion as directed Prim Dressing: KerraCel Ag Gelling Fiber Dressing, 2x2 in (silver alginate) (DME) (Generic) Every Other Day/7 Days ary Discharge Instructions: Apply silver alginate to wound bed as instructed Secondary Dressing: Woven Gauze Sponge, Non-Sterile 4x4 in (DME) (Generic) Every Other Day/7 Days Discharge Instructions: Apply over primary dressing as directed. Secondary Dressing: ALLEVYN Heel 4 1/2in x 5 1/2in / 10.5cm x 13.5cm Every Other Day/7 Days Discharge Instructions: Apply over primary dressing as directed. Secured With: [employer], 4.5x3.1 (in/yd) (DME) (Generic) Every Other Day/7 Days Discharge Instructions: Secure with Kerlix as directed. Secured With: Paper T ape, 2x10 (in/yd) (DME) (Generic) Every Other Day/7 Days Discharge Instructions: Secure dressing with tape as directed. 1. Arterial studies need to be rebooked ASAP 2. Continue with silver alginate. 3. I am not going to wrap the right leg there is no open wounds here. I will monitor the edema here next time 4. Left leg still under compression kerlix and light Coban Electronic Signature(s) Signed: 09/21/2020 5:51:45 PM By: Linton Ham MD Entered By: Linton Ham on 09/21/2020 09:37:19 -------------------------------------------------------------------------------- SuperBill Details Patient Name: Date of Service: Kathleen [name], DELO RES M. 09/21/2020 Medical Record Number: [id number] Patient Account Number: [id number] Date of Birth/Sex: Treating RN: 07-31-1946 (75 y.o. Kathleen [name] Primary Care Provider: Benito Mccreedy Other Clinician: Referring Provider: Treating Provider/Extender: Loma Sender in Treatment: 1 Diagnosis Coding ICD-10 Codes Code Description E11.51 Type 2 diabetes mellitus with diabetic  peripheral angiopathy without gangrene I87.323 Chronic venous hypertension (idiopathic) with inflammation of bilateral lower extremity L97.411 Non-pressure chronic ulcer of right heel and midfoot limited to breakdown of skin L97.521 Non-pressure chronic ulcer of other part of left foot limited to breakdown of skin L97.822 Non-pressure chronic ulcer of other part of left lower leg with fat layer exposed E11.42 Type 2 diabetes mellitus with diabetic polyneuropathy Facility Procedures CPT4 Code: 76283151 Description: [phone] - [place]BRIDE W/O ANES NON SELECT Modifier: Quantity: 1 CPT4 Code: 73710626 Description: (Facility Use Only) [id number] - [place] LWR LT LEG Modifier: 61 Quantity: 1 Physician Procedures : CPT4 Code Description Modifier [id number] - WC PHYS LEVEL 3 - EST PT ICD-10 Diagnosis Description L97.411 Non-pressure chronic ulcer of right heel and midfoot limited to breakdown of skin E11.51 Type 2 diabetes mellitus with diabetic peripheral  angiopathy without gangrene L97.521 Non-pressure chronic ulcer of other part of left foot limited to breakdown of skin L97.822 Non-pressure chronic ulcer of other part of left lower leg with fat layer exposed Quantity: 1 Electronic Signature(s) Signed: 09/21/2020 5:50:50 PM By: Levan Hurst RN, BSN Signed: 09/21/2020 5:51:45 PM By: Linton Ham MD Entered By: Levan Hurst on 09/21/2020 11:04:10

## 2020-09-21 NOTE — Progress Notes (Signed)
NORLENE, LANES ([id number]) Visit Report for 09/21/2020 Arrival Information Details Patient Name: Date of Service: Kathleen [name] RES M. 09/21/2020 8:30 A M Medical Record Number: [id number] Patient Account Number: [id number] Date of Birth/Sex: Treating RN: [birth year] (75 y.o. Kathleen [name], Kathleen [name] Primary Care Tykisha Areola: Benito Mccreedy Other Clinician: Referring Kahiau Schewe: Treating Seddrick Flax/Extender: Loma Sender in Treatment: 1 Visit Information History Since Last Visit Added or deleted any medications: No Patient Arrived: Wheel Chair Any new allergies or adverse reactions: No Arrival Time: 08:44 Had a fall or experienced change in No Accompanied By: fam activities of daily living that may affect Transfer Assistance: None risk of falls: Patient Identification Verified: Yes Signs or symptoms of abuse/neglect since last visito No Secondary Verification Process Completed: Yes Hospitalized since last visit: No Patient Requires Transmission-Based Precautions: No Implantable device outside of the clinic excluding No Patient Has Alerts: No cellular tissue based products placed in the center since last visit: Has Dressing in Place as Prescribed: Yes Pain Present Now: Yes Electronic Signature(s) Signed: 09/21/2020 5:09:26 PM By: Rhae Hammock RN Entered By: Rhae Hammock on 09/21/2020 08:51:13 -------------------------------------------------------------------------------- Clinic Level of Care Assessment Details Patient Name: Date of Service: Kathleen [name] RES M. 09/21/2020 8:30 A M Medical Record Number: [id number] Patient Account Number: [id number] Date of Birth/Sex: Treating RN: 1945-10-31 (75 y.o. Kathleen [name] Primary Care Harrol Novello: Benito Mccreedy Other Clinician: Referring Chloey Ricard: Treating Lakrisha Iseman/Extender: Loma Sender in Treatment: 1 Clinic Level of Care Assessment Items TOOL 4 Quantity Score X- 1  0 Use when only an EandM is performed on FOLLOW-UP visit ASSESSMENTS - Nursing Assessment / Reassessment X- 1 10 Reassessment of Co-morbidities (includes updates in patient status) X- 1 5 Reassessment of Adherence to Treatment Plan ASSESSMENTS - Wound and Skin A ssessment / Reassessment _0  - 0 Simple Wound Assessment / Reassessment - one wound X- 4 5 Complex Wound Assessment / Reassessment - multiple wounds _1  - 0 Dermatologic / Skin Assessment (not related to wound area) ASSESSMENTS - Focused Assessment _2  - 0 Circumferential Edema Measurements - multi extremities _3  - 0 Nutritional Assessment / Counseling / Intervention X- 1 5 Lower Extremity Assessment (monofilament, tuning fork, pulses) _4  - 0 Peripheral Arterial Disease Assessment (using hand held doppler) ASSESSMENTS - Ostomy and/or Continence Assessment and Care _5  - 0 Incontinence Assessment and Management _6  - 0 Ostomy Care Assessment and Management (repouching, etc.) PROCESS - Coordination of Care X - Simple Patient / Family Education for ongoing care 1 15 _7  - 0 Complex (extensive) Patient / Family Education for ongoing care X- 1 10 Staff obtains Programmer, systems, Records, T Results / Process Orders est _8  - 0 Staff telephones HHA, Nursing Homes / Clarify orders / etc _9  - 0 Routine Transfer to another Facility (non-emergent condition) _10  - 0 Routine Hospital Admission (non-emergent condition) _11  - 0 New Admissions / Biomedical engineer / Ordering NPWT Apligraf, etc. , _12  - 0 Emergency Hospital Admission (emergent condition) X- 1 10 Simple Discharge Coordination _13  - 0 Complex (extensive) Discharge Coordination PROCESS - Special Needs _14  - 0 Pediatric / Minor Patient Management _15  - 0 Isolation Patient Management _16  - 0 Hearing / Language / Visual special needs _17  - 0 Assessment of Community assistance (transportation, D/C planning, etc.) _18  - 0 Additional assistance / Altered mentation _19  -  0 Support Surface(s) Assessment (bed, cushion, seat, etc.) INTERVENTIONS - Wound Cleansing / Measurement _20  - 0 Simple Wound Cleansing - one wound X- 4 5 Complex Wound Cleansing -  multiple wounds X- 1 5 Wound Imaging (photographs - any number of wounds) _0  - 0 Wound Tracing (instead of photographs) _1  - 0 Simple Wound Measurement - one wound X- 4 5 Complex Wound Measurement - multiple wounds INTERVENTIONS - Wound Dressings _2  - 0 Small Wound Dressing one or multiple wounds _3  - 0 Medium Wound Dressing one or multiple wounds X- 1 20 Large Wound Dressing one or multiple wounds <DGUYQIHKVQQVZDGL>_8<\/VFIEPPIRJJOACZYS>_0  - 0 Application of Medications - topical <YTKZSWFUXNATFTDD>_2<\/KGURKYHCWCBJSEGB>_1  - 0 Application of Medications - injection INTERVENTIONS - Miscellaneous _6  - 0 External ear exam _7  - 0 Specimen Collection (cultures, biopsies, blood, body fluids, etc.) _8  - 0 Specimen(s) / Culture(s) sent or taken to Lab for analysis _9  - 0 Patient Transfer (multiple staff / Civil Service fast streamer / Similar devices) _10  - 0 Simple Staple / Suture removal (25 or less) _11  - 0 Complex Staple / Suture removal (26 or more) _12  - 0 Hypo / Hyperglycemic Management (close monitor of Blood Glucose) _13  - 0 Ankle / Brachial Index (ABI) - do not check if billed separately X- 1 5 Vital Signs Has the patient been seen at the hospital within the last three years: Yes Total Score: 145 Level Of Care: New/Established - Level 4 Electronic Signature(s) Signed: 09/21/2020 5:50:50 PM By: Levan Hurst RN, BSN Entered By: Levan Hurst on 09/21/2020 09:37:41 -------------------------------------------------------------------------------- Compression Therapy Details Patient Name: Date of Service: Kathleen [name] RES M. 09/21/2020 8:30 A M Medical Record Number: [id number] Patient Account Number: [id number] Date of Birth/Sex: Treating RN: 03/30/1946 (75 y.o. Kathleen [name] Primary Care Morell Mears: Benito Mccreedy Other Clinician: Referring Kelley Knoth: Treating  Janne Faulk/Extender: Loma Sender in Treatment: 1 Compression Therapy Performed for Wound Assessment: Wound #12 Left,Anterior Lower Leg Performed By: Clinician Levan Hurst, RN Compression Type: Double Layer Post Procedure Diagnosis Same as Pre-procedure Electronic Signature(s) Signed: 09/21/2020 5:50:50 PM By: Levan Hurst RN, BSN Entered By: Levan Hurst on 09/21/2020 11:03:52 -------------------------------------------------------------------------------- Compression Therapy Details Patient Name: Date of Service: Kathleen [name] RES M. 09/21/2020 8:30 A M Medical Record Number: [id number] Patient Account Number: [id number] Date of Birth/Sex: Treating RN: 1946-03-15 (75 y.o. Kathleen [name] Primary Care [place]re [place]: Benito Mccreedy Other Clinician: Referring Kaislyn Gulas: Treating Kyo Cocuzza/Extender: Loma Sender in Treatment: 1 Compression Therapy Performed for Wound Assessment: Wound #11 Left,Medial Lower Leg Performed By: Clinician Levan Hurst, RN Compression Type: Double Layer Post Procedure Diagnosis Same as Pre-procedure Electronic Signature(s) Signed: 09/21/2020 5:50:50 PM By: Levan Hurst RN, BSN Entered By: Levan Hurst on 09/21/2020 11:03:53 -------------------------------------------------------------------------------- Encounter Discharge Information Details Patient Name: Date of Service: Kathleen [name] RES M. 09/21/2020 8:30 A M Medical Record Number: [id number] Patient Account Number: [id number] Date of Birth/Sex: Treating RN: [birth year] (75 y.o. Kathleen [name], Kathleen [name] Primary Care Jerrell Hart: Benito Mccreedy Other Clinician: Referring Ronelle Smallman: Treating Malaisha Silliman/Extender: Loma Sender in Treatment: 1 Encounter Discharge Information Items Post Procedure Vitals Discharge Condition: Stable Temperature (F): 97.6 Ambulatory Status: Wheelchair Pulse (bpm): 90 Discharge Destination:  Home Respiratory Rate (breaths/min): 17 Transportation: Private Auto Blood Pressure (mmHg): 146/79 Accompanied By: self Schedule Follow-up Appointment: Yes Clinical Summary of Care: Electronic Signature(s) Signed: 09/21/2020 5:25:06 PM By: Deon Pilling Entered By: Deon Pilling on 09/21/2020 10:06:35 -------------------------------------------------------------------------------- Lower Extremity Assessment Details Patient Name: Date of Service: Kathleen [name] RES M. 09/21/2020 8:30 A M Medical Record Number: [id number] Patient Account Number: [id number] Date of Birth/Sex: Treating RN: Jun 24, 1946 (75 y.o. Kathleen [name] Primary Care Tifani Dack: Benito Mccreedy Other Clinician: Referring Darrel Gloss: Treating  Sada Mazzoni/Extender: Loma Sender in Treatment: 1 Edema Assessment Assessed: [Left: No] [Right: No] Edema: [Left: Yes] [Right: Yes] Calf Left: Right: Point of Measurement: 28 cm From Medial Instep 36.5 cm 36 cm Ankle Left: Right: Point of Measurement: 9 cm From Medial Instep 19.5 cm 21 cm Electronic Signature(s) Signed: 09/21/2020 5:09:26 PM By: Rhae Hammock RN Entered By: Rhae Hammock on 09/21/2020 08:52:10 -------------------------------------------------------------------------------- Multi Wound Chart Details Patient Name: Date of Service: Kathleen [name] RES M. 09/21/2020 8:30 A M Medical Record Number: [id number] Patient Account Number: [id number] Date of Birth/Sex: Treating RN: 06-27-1946 (75 y.o. Kathleen [name] Primary Care Kerim Statzer: Benito Mccreedy Other Clinician: Referring Quayshaun Hubbert: Treating Burlon Centrella/Extender: Loma Sender in Treatment: 1 Vital Signs Height(in): 52 Pulse(bpm): 2 Weight(lbs): 187 Blood Pressure(mmHg): 146/79 Body Mass Index(BMI): 33 Temperature(F): 97.6 Respiratory Rate(breaths/min): 17 Photos: [10:No Photos Left T Great oe] [11:No Photos Left, Medial Lower Leg]  [12:No Photos Left, Anterior Lower Leg] Wound Location: [10:Blister] [11:Blister] [12:Blister] Wounding Event: [10:Diabetic Wound/Ulcer of the Lower] [11:Diabetic Wound/Ulcer of the Lower] [12:Diabetic Wound/Ulcer of the Lower] Primary Etiology: [10:Extremity Arterial Insufficiency Ulcer] [11:Extremity Venous Leg Ulcer] [12:Extremity Venous Leg Ulcer] Secondary Etiology: [10:Cataracts, Glaucoma, Anemia,] [11:Cataracts, Glaucoma, Anemia,] [12:Cataracts, Glaucoma, Anemia,] Comorbid History: [10:Asthma, Congestive Heart Failure, Coronary Artery Disease, Hypertension, Peripheral Arterial Disease, Type II Diabetes, Osteoarthritis 07/14/2020] [11:Asthma, Congestive Heart Failure, Coronary Artery Disease, Hypertension,  Peripheral Arterial Disease, Type II Diabetes, Osteoarthritis 07/14/2020] [12:Asthma, Congestive Heart Failure, Coronary Artery Disease, Hypertension, Peripheral Arterial Disease, Type II Diabetes, Osteoarthritis 07/14/2020] Date Acquired: [10:1] [11:1] [12:1] Weeks of Treatment: [10:Open] [11:Open] [12:Open] Wound Status: [10:0.7x0.5x0.1] [11:3.5x2.7x0.1] [12:0.7x0.5x0.1] Measurements L x W x D (cm) [10:0.275] [11:7.422] [12:0.275] A (cm) : rea [10:0.027] [11:0.742] [12:0.027] Volume (cm) : [10:22.10%] [11:20.60%] [12:70.80%] % Reduction in A [10:rea: 22.90%] [11:20.60%] [12:71.30%] % Reduction in Volume: [10:Grade 2] [11:Grade 2] [12:Grade 2] Classification: [10:Medium] [11:Medium] [12:Medium] Exudate A mount: [10:Serosanguineous] [11:Serosanguineous] [12:Serosanguineous] Exudate Type: [10:red, brown] [11:red, brown] [12:red, brown] Exudate Color: [10:Distinct, outline attached] [11:Distinct, outline attached] [12:Distinct, outline attached] Wound Margin: [10:Large (67-100%)] [11:Large (67-100%)] [12:Large (67-100%)] Granulation A mount: [10:Red] [11:Pink, Pale] [12:Pink, Pale] Granulation Quality: [10:None Present (0%)] [11:None Present (0%)] [12:None Present (0%)] Necrotic A  mount: [10:Fat Layer (Subcutaneous Tissue): Yes Fat Layer (Subcutaneous Tissue): Yes Fat Layer (Subcutaneous Tissue): Yes] Exposed Structures: [10:Fascia: No Tendon: No Muscle: No Joint: No Bone: No Small (1-33%)] [11:Fascia: No Tendon: No Muscle: No Joint: No Bone: No None] [12:Fascia: No Tendon: No Muscle: No Joint: No Bone: No None] Epithelialization: [10:N/A] [11:N/A] [12:N/A] Debridement: [10:N/A] [11:N/A] [12:N/A] Instrument: [10:N/A] [11:N/A] [12:N/A] Bleeding: Debridement Treatment Response: N/A [11:N/A] [12:N/A] Post Debridement Measurements L x N/A [11:N/A] [12:N/A] W x D (cm) [10:N/A] [11:N/A] [12:N/A] Post Debridement Volume: (cm) [10:N/A] [11:N/A] [12:N/A] Wound Number: 9 N/A N/A Photos: No Photos N/A N/A Right Calcaneus N/A N/A Wound Location: Blister N/A N/A Wounding Event: Diabetic Wound/Ulcer of the Lower N/A N/A Primary Etiology: Extremity N/A N/A N/A Secondary Etiology: Cataracts, Glaucoma, Anemia, N/A N/A Comorbid History: Asthma, Congestive Heart Failure, Coronary Artery Disease, Hypertension, Peripheral Arterial Disease, Type II Diabetes, Osteoarthritis 07/14/2020 N/A N/A Date Acquired: 1 N/A N/A Weeks of Treatment: Open N/A N/A Wound Status: 2x0.5x0.1 N/A N/A Measurements L x W x D (cm) 0.785 N/A N/A A (cm) : rea 0.079 N/A N/A Volume (cm) : 70.60% N/A N/A % Reduction in A rea: 70.40% N/A N/A % Reduction in Volume: Grade 2 N/A N/A Classification: Medium N/A N/A Exudate A mount: Serosanguineous N/A N/A Exudate Type:  red, brown N/A N/A Exudate Color: Distinct, outline attached N/A N/A Wound Margin: Large (67-100%) N/A N/A Granulation A mount: Pink N/A N/A Granulation Quality: None Present (0%) N/A N/A Necrotic A mount: Fat Layer (Subcutaneous Tissue): Yes N/A N/A Exposed Structures: Fascia: No Tendon: No Muscle: No Joint: No Bone: No Small (1-33%) N/A N/A Epithelialization: Chemical/Enzymatic/Mechanical N/A  N/A Debridement: Pre-procedure Verification/Time Out 09:28 N/A N/A Taken: N/A N/A N/A Instrument: None N/A N/A Bleeding: Procedure was tolerated well N/A N/A Debridement Treatment Response: 2x0.5x0.1 N/A N/A Post Debridement Measurements L x W x D (cm) 0.079 N/A N/A Post Debridement Volume: (cm) Debridement N/A N/A Procedures Performed: Treatment Notes Electronic Signature(s) Signed: 09/21/2020 5:50:50 PM By: Zandra Abts RN, BSN Signed: 09/21/2020 5:51:45 PM By: Baltazar Najjar MD Entered By: Baltazar Najjar on 09/21/2020 09:31:25 -------------------------------------------------------------------------------- Multi-Disciplinary Care Plan Details Patient Name: Date of Service: Kathleen [name], DELO RES M. 09/21/2020 8:30 A M Medical Record Number: [id number] Patient Account Number: [id number] Date of Birth/Sex: Treating RN: [birth year] (75 y.o. Wynelle Link Primary Care Lauralye Kinn: Jackie Plum Other Clinician: Referring Clarkson Rosselli: Treating Jaydan Meidinger/Extender: Retta Diones in Treatment: 1 Active Inactive Abuse / Safety / Falls / Self Care Management Nursing Diagnoses: Potential for falls Potential for injury related to falls Goals: Patient will not experience any injury related to falls Date Initiated: 09/14/2020 Target Resolution Date: 10/16/2020 Goal Status: Active Patient/caregiver will verbalize/demonstrate measures taken to prevent injury and/or falls Date Initiated: 09/14/2020 Target Resolution Date: 10/16/2020 Goal Status: Active Interventions: Assess Activities of Daily Living upon admission and as needed Assess fall risk on admission and as needed Assess: immobility, friction, shearing, incontinence upon admission and as needed Assess impairment of mobility on admission and as needed per policy Assess personal safety and home safety (as indicated) on admission and as needed Assess self care needs on admission and as needed Provide  education on fall prevention Provide education on personal and home safety Notes: Nutrition Nursing Diagnoses: Impaired glucose control: actual or potential Potential for alteratiion in Nutrition/Potential for imbalanced nutrition Goals: Patient/caregiver agrees to and verbalizes understanding of need to use nutritional supplements and/or vitamins as prescribed Date Initiated: 09/14/2020 Target Resolution Date: 10/16/2020 Goal Status: Active Patient/caregiver will maintain therapeutic glucose control Date Initiated: 09/14/2020 Target Resolution Date: 10/16/2020 Goal Status: Active Interventions: Assess HgA1c results as ordered upon admission and as needed Assess patient nutrition upon admission and as needed per policy Provide education on elevated blood sugars and impact on wound healing Provide education on nutrition Treatment Activities: Education provided on Nutrition : 09/14/2020 Notes: Venous Leg Ulcer Nursing Diagnoses: Knowledge deficit related to disease process and management Goals: Patient/caregiver will verbalize understanding of disease process and disease management Date Initiated: 09/14/2020 Target Resolution Date: 10/16/2020 Goal Status: Active Interventions: Assess peripheral edema status every visit. Notes: Wound/Skin Impairment Nursing Diagnoses: Impaired tissue integrity Knowledge deficit related to ulceration/compromised skin integrity Goals: Patient/caregiver will verbalize understanding of skin care regimen Date Initiated: 09/14/2020 Target Resolution Date: 10/16/2020 Goal Status: Active Interventions: Assess patient/caregiver ability to obtain necessary supplies Assess patient/caregiver ability to perform ulcer/skin care regimen upon admission and as needed Assess ulceration(s) every visit Provide education on ulcer and skin care Notes: Electronic Signature(s) Signed: 09/21/2020 5:50:50 PM By: Zandra Abts RN, BSN Entered By: Zandra Abts on 09/21/2020  09:04:11 -------------------------------------------------------------------------------- Pain Assessment Details Patient Name: Date of Service: Kathleen [name] RES M. 09/21/2020 8:30 A M Medical Record Number: [id number] Patient Account Number: [id number] Date of Birth/Sex: Treating RN: 07-22-1946 (75 y.o. F)  Rhae Hammock Primary Care Levis Nazir: Benito Mccreedy Other Clinician: Referring Jazae Gandolfi: Treating Xenia Nile/Extender: Loma Sender in Treatment: 1 Active Problems Location of Pain Severity and Description of Pain Patient Has Paino Yes Site Locations Pain Location: Pain in Ulcers With Dressing Change: Yes Duration of the Pain. Constant / Intermittento Constant Rate the pain. Current Pain Level: 7 Worst Pain Level: 10 Least Pain Level: 0 Tolerable Pain Level: 7 Character of Pain Describe the Pain: Aching Pain Management and Medication Current Pain Management: Medication: Yes Cold Application: No Rest: Yes Massage: No Activity: No T.E.N.S.: No Heat Application: No Leg drop or elevation: No Is the Current Pain Management Adequate: Adequate How does your wound impact your activities of daily livingo Sleep: No Bathing: No Appetite: No Relationship With Others: No Bladder Continence: No Emotions: No Bowel Continence: No Work: No Toileting: No Drive: No Dressing: No Hobbies: No Electronic Signature(s) Signed: 09/21/2020 5:09:26 PM By: Rhae Hammock RN Entered By: Rhae Hammock on 09/21/2020 08:52:06 -------------------------------------------------------------------------------- Patient/Caregiver Education Details Patient Name: Date of Service: Kathleen [name] RES M. 2/14/2022andnbsp8:30 [place] Record Number: [id number] Patient Account Number: [id number] Date of Birth/Gender: Treating RN: August 06, 1946 (75 y.o. Kathleen [name] Primary Care Physician: Benito Mccreedy Other Clinician: Referring  Physician: Treating Physician/Extender: Loma Sender in Treatment: 1 Education Assessment Education Provided To: Patient Education Topics Provided Wound/Skin Impairment: Methods: Explain/Verbal Responses: State content correctly Electronic Signature(s) Signed: 09/21/2020 5:50:50 PM By: Levan Hurst RN, BSN Entered By: Levan Hurst on 09/21/2020 09:04:22 -------------------------------------------------------------------------------- Wound Assessment Details Patient Name: Date of Service: Kathleen [name] RES M. 09/21/2020 8:30 A M Medical Record Number: [id number] Patient Account Number: [id number] Date of Birth/Sex: Treating RN: Jun 21, 1946 (75 y.o. Kathleen [name], Kathleen [name] Primary Care Chadrick Sprinkle: Benito Mccreedy Other Clinician: Referring Zayn Selley: Treating Zeidy Tayag/Extender: Loma Sender in Treatment: 1 Wound Status Wound Number: 10 Primary Diabetic Wound/Ulcer of the Lower Extremity Etiology: Wound Location: Left T Great oe Secondary Arterial Insufficiency Ulcer Wounding Event: Blister Etiology: Date Acquired: 07/14/2020 Wound Open Weeks Of Treatment: 1 Status: Clustered Wound: No Comorbid Cataracts, Glaucoma, Anemia, Asthma, Congestive Heart Failure, History: Coronary Artery Disease, Hypertension, Peripheral Arterial Disease, Type II Diabetes, Osteoarthritis Wound Measurements Length: (cm) 0.7 Width: (cm) 0.5 Depth: (cm) 0.1 Area: (cm) 0.275 Volume: (cm) 0.027 % Reduction in Area: 22.1% % Reduction in Volume: 22.9% Epithelialization: Small (1-33%) Tunneling: No Undermining: No Wound Description Classification: Grade 2 Wound Margin: Distinct, outline attached Exudate Amount: Medium Exudate Type: Serosanguineous Exudate Color: red, brown Foul Odor After Cleansing: No Slough/Fibrino Yes Wound Bed Granulation Amount: Large (67-100%) Exposed Structure Granulation Quality: Red Fascia Exposed:  No Necrotic Amount: None Present (0%) Fat Layer (Subcutaneous Tissue) Exposed: Yes Tendon Exposed: No Muscle Exposed: No Joint Exposed: No Bone Exposed: No Treatment Notes Wound #10 (Toe Great) Wound Laterality: Left Cleanser Normal Saline Discharge Instruction: Cleanse the wound with Normal Saline or soap and water prior to applying a clean dressing using gauze sponges, not tissue or cotton balls. Peri-Wound Care Triamcinolone 15 (g) Discharge Instruction: Use triamcinolone 15 (g) as directed Sween Lotion (Moisturizing lotion) Discharge Instruction: Apply moisturizing lotion as directed Topical Primary Dressing KerraCel Ag Gelling Fiber Dressing, 2x2 in (silver alginate) Discharge Instruction: Apply silver alginate to wound bed as instructed Secondary Dressing Woven Gauze Sponges 2x2 in Discharge Instruction: Apply over primary dressing as directed. Secured With Conforming Stretch Gauze Bandage, Sterile 2x75 (in/in) Discharge Instruction: Secure with stretch gauze as directed. Paper Tape, 1x10 (in/yd) Discharge Instruction: Secure dressing  with tape as directed. Compression Wrap Compression Stockings Add-Ons Electronic Signature(s) Signed: 09/21/2020 5:09:26 PM By: Rhae Hammock RN Entered By: Rhae Hammock on 09/21/2020 09:04:13 -------------------------------------------------------------------------------- Wound Assessment Details Patient Name: Date of Service: Kathleen [name] RES M. 09/21/2020 8:30 A M Medical Record Number: [id number] Patient Account Number: [id number] Date of Birth/Sex: Treating RN: Sep 01, 1945 (75 y.o. Kathleen [name], Kathleen [name] Primary Care Tylor Courtwright: Benito Mccreedy Other Clinician: Referring Karmah Potocki: Treating Totiana Everson/Extender: Loma Sender in Treatment: 1 Wound Status Wound Number: 11 Primary Diabetic Wound/Ulcer of the Lower Extremity Etiology: Wound Location: Left, Medial Lower Leg Secondary Venous Leg  Ulcer Wounding Event: Blister Etiology: Date Acquired: 07/14/2020 Wound Open Weeks Of Treatment: 1 Status: Clustered Wound: No Comorbid Cataracts, Glaucoma, Anemia, Asthma, Congestive Heart Failure, History: Coronary Artery Disease, Hypertension, Peripheral Arterial Disease, Type II Diabetes, Osteoarthritis Wound Measurements Length: (cm) 3.5 Width: (cm) 2.7 Depth: (cm) 0.1 Area: (cm) 7.422 Volume: (cm) 0.742 % Reduction in Area: 20.6% % Reduction in Volume: 20.6% Epithelialization: None Tunneling: No Undermining: No Wound Description Classification: Grade 2 Wound Margin: Distinct, outline attached Exudate Amount: Medium Exudate Type: Serosanguineous Exudate Color: red, brown Foul Odor After Cleansing: No Slough/Fibrino No Wound Bed Granulation Amount: Large (67-100%) Exposed Structure Granulation Quality: Pink, Pale Fascia Exposed: No Necrotic Amount: None Present (0%) Fat Layer (Subcutaneous Tissue) Exposed: Yes Tendon Exposed: No Muscle Exposed: No Joint Exposed: No Bone Exposed: No Treatment Notes Wound #11 (Lower Leg) Wound Laterality: Left, Medial Cleanser Soap and Water Discharge Instruction: May shower and wash wound with dial antibacterial soap and water prior to dressing change. Peri-Wound Care Triamcinolone 15 (g) Discharge Instruction: Use triamcinolone 15 (g) as directed Sween Lotion (Moisturizing lotion) Discharge Instruction: Apply moisturizing lotion as directed Topical Primary Dressing KerraCel Ag Gelling Fiber Dressing, 4x5 in (silver alginate) Discharge Instruction: Apply silver alginate to wound bed as instructed Secondary Dressing Woven Gauze Sponge, Non-Sterile 4x4 in Discharge Instruction: Apply over primary dressing as directed. ABD Pad, 5x9 Discharge Instruction: Apply over primary dressing as directed. Secured With Compression Wrap Kerlix Roll 4.5x3.1 (in/yd) Discharge Instruction: ***WRAP LIGHTLY*** Coban Self-Adherent Wrap  4x5 (in/yd) Discharge Instruction: ***WRAP LIGHTLY*** Compression Stockings Add-Ons Electronic Signature(s) Signed: 09/21/2020 5:09:26 PM By: Rhae Hammock RN Entered By: Rhae Hammock on 09/21/2020 09:04:34 -------------------------------------------------------------------------------- Wound Assessment Details Patient Name: Date of Service: Kathleen [name] RES M. 09/21/2020 8:30 A M Medical Record Number: [id number] Patient Account Number: [id number] Date of Birth/Sex: Treating RN: 08-Sep-1945 (75 y.o. Kathleen [name], Kathleen [name] Primary Care Tanekia Ryans: Benito Mccreedy Other Clinician: Referring Cruz Bong: Treating Trestin Vences/Extender: Loma Sender in Treatment: 1 Wound Status Wound Number: 12 Primary Diabetic Wound/Ulcer of the Lower Extremity Etiology: Wound Location: Left, Anterior Lower Leg Secondary Venous Leg Ulcer Wounding Event: Blister Etiology: Date Acquired: 07/14/2020 Wound Open Weeks Of Treatment: 1 Status: Clustered Wound: No Comorbid Cataracts, Glaucoma, Anemia, Asthma, Congestive Heart Failure, History: Coronary Artery Disease, Hypertension, Peripheral Arterial Disease, Type II Diabetes, Osteoarthritis Wound Measurements Length: (cm) 0.7 Width: (cm) 0.5 Depth: (cm) 0.1 Area: (cm) 0.275 Volume: (cm) 0.027 Wound Description Classification: Grade 2 Wound Margin: Distinct, outline attached Exudate Amount: Medium Exudate Type: Serosanguineous Exudate Color: red, brown Foul Odor After Cleansing: No Slough/Fibrino No % Reduction in Area: 70.8% % Reduction in Volume: 71.3% Epithelialization: None Tunneling: No Undermining: No Wound Bed Granulation Amount: Large (67-100%) Exposed Structure Granulation Quality: Pink, Pale Fascia Exposed: No Necrotic Amount: None Present (0%) Fat Layer (Subcutaneous Tissue) Exposed: Yes Tendon Exposed: No Muscle Exposed: No Joint Exposed: No Bone Exposed:  No Treatment Notes Wound #12  (Lower Leg) Wound Laterality: Left, Anterior Cleanser Soap and Water Discharge Instruction: May shower and wash wound with dial antibacterial soap and water prior to dressing change. Peri-Wound Care Triamcinolone 15 (g) Discharge Instruction: Use triamcinolone 15 (g) as directed Sween Lotion (Moisturizing lotion) Discharge Instruction: Apply moisturizing lotion as directed Topical Primary Dressing KerraCel Ag Gelling Fiber Dressing, 4x5 in (silver alginate) Discharge Instruction: Apply silver alginate to wound bed as instructed Secondary Dressing Woven Gauze Sponge, Non-Sterile 4x4 in Discharge Instruction: Apply over primary dressing as directed. ABD Pad, 5x9 Discharge Instruction: Apply over primary dressing as directed. Secured With Compression Wrap Kerlix Roll 4.5x3.1 (in/yd) Discharge Instruction: ***WRAP LIGHTLY*** Coban Self-Adherent Wrap 4x5 (in/yd) Discharge Instruction: ***WRAP LIGHTLY*** Compression Stockings Add-Ons Electronic Signature(s) Signed: 09/21/2020 5:09:26 PM By: Rhae Hammock RN Entered By: Rhae Hammock on 09/21/2020 09:04:55 -------------------------------------------------------------------------------- Wound Assessment Details Patient Name: Date of Service: Kathleen [name] RES M. 09/21/2020 8:30 A M Medical Record Number: [id number] Patient Account Number: [id number] Date of Birth/Sex: Treating RN: 1945-12-02 (75 y.o. Kathleen [name], Kathleen [name] Primary Care Tashan Kreitzer: Benito Mccreedy Other Clinician: Referring Kemoni Quesenberry: Treating Johnross Nabozny/Extender: Loma Sender in Treatment: 1 Wound Status Wound Number: 9 Primary Diabetic Wound/Ulcer of the Lower Extremity Etiology: Wound Location: Right Calcaneus Wound Open Wounding Event: Blister Status: Date Acquired: 07/14/2020 Comorbid Cataracts, Glaucoma, Anemia, Asthma, Congestive Heart Failure, Weeks Of Treatment: 1 History: Coronary Artery Disease, Hypertension,  Peripheral Arterial Disease, Clustered Wound: No Type II Diabetes, Osteoarthritis Wound Measurements Length: (cm) 2 Width: (cm) 0.5 Depth: (cm) 0.1 Area: (cm) 0.785 Volume: (cm) 0.079 % Reduction in Area: 70.6% % Reduction in Volume: 70.4% Epithelialization: Small (1-33%) Tunneling: No Undermining: No Wound Description Classification: Grade 2 Wound Margin: Distinct, outline attached Exudate Amount: Medium Exudate Type: Serosanguineous Exudate Color: red, brown Foul Odor After Cleansing: No Slough/Fibrino No Wound Bed Granulation Amount: Large (67-100%) Exposed Structure Granulation Quality: Pink Fascia Exposed: No Necrotic Amount: None Present (0%) Fat Layer (Subcutaneous Tissue) Exposed: Yes Tendon Exposed: No Muscle Exposed: No Joint Exposed: No Bone Exposed: No Treatment Notes Wound #9 (Calcaneus) Wound Laterality: Right Cleanser Soap and Water Discharge Instruction: May shower and wash wound with dial antibacterial soap and water prior to dressing change. Byram Ancillary Kit - 15 Day Supply Discharge Instruction: Use supplies as instructed; Kit contains: (15) Saline Bullets; (15) 3x3 Gauze; 15 pr Gloves Peri-Wound Care Triamcinolone 15 (g) Discharge Instruction: Use triamcinolone 15 (g) as directed Sween Lotion (Moisturizing lotion) Discharge Instruction: Apply moisturizing lotion as directed Topical Primary Dressing KerraCel Ag Gelling Fiber Dressing, 2x2 in (silver alginate) Discharge Instruction: Apply silver alginate to wound bed as instructed Secondary Dressing Woven Gauze Sponge, Non-Sterile 4x4 in Discharge Instruction: Apply over primary dressing as directed. ALLEVYN Heel 4 1/2in x 5 1/2in / 10.5cm x 13.5cm Discharge Instruction: Apply over primary dressing as directed. Secured With [employer], 4.5x3.1 (in/yd) Discharge Instruction: Secure with Kerlix as directed. Paper Tape, 2x10 (in/yd) Discharge Instruction: Secure dressing with tape  as directed. Compression Wrap Compression Stockings Add-Ons Electronic Signature(s) Signed: 09/21/2020 5:09:26 PM By: Rhae Hammock RN Entered By: Rhae Hammock on 09/21/2020 09:05:11 -------------------------------------------------------------------------------- Vitals Details Patient Name: Date of Service: [name], DELO RES M. 09/21/2020 8:30 A M Medical Record Number: [id number] Patient Account Number: [id number] Date of Birth/Sex: Treating RN: 1946/03/15 (75 y.o. Kathleen [name] Primary Care Nissim Fleischer: Benito Mccreedy Other Clinician: Referring Kalayah Leske: Treating Joniel Graumann/Extender: Loma Sender in Treatment: 1 Vital Signs Time Taken: 08:51 Temperature (F):  97.6 Height (in): 63 Pulse (bpm): 90 Weight (lbs): 187 Respiratory Rate (breaths/min): 17 Body Mass Index (BMI): 33.1 Blood Pressure (mmHg): 146/79 Reference Range: 80 - 120 mg / dl Electronic Signature(s) Signed: 09/21/2020 5:09:26 PM By: Rhae Hammock RN Entered By: Rhae Hammock on 09/21/2020 08:51:33

## 2020-09-22 DIAGNOSIS — N186 End stage renal disease: Secondary | ICD-10-CM | POA: Diagnosis not present

## 2020-09-22 DIAGNOSIS — N2581 Secondary hyperparathyroidism of renal origin: Secondary | ICD-10-CM | POA: Diagnosis not present

## 2020-09-22 DIAGNOSIS — Z992 Dependence on renal dialysis: Secondary | ICD-10-CM | POA: Diagnosis not present

## 2020-09-24 DIAGNOSIS — Z992 Dependence on renal dialysis: Secondary | ICD-10-CM | POA: Diagnosis not present

## 2020-09-24 DIAGNOSIS — N2581 Secondary hyperparathyroidism of renal origin: Secondary | ICD-10-CM | POA: Diagnosis not present

## 2020-09-24 DIAGNOSIS — N186 End stage renal disease: Secondary | ICD-10-CM | POA: Diagnosis not present

## 2020-09-26 DIAGNOSIS — N186 End stage renal disease: Secondary | ICD-10-CM | POA: Diagnosis not present

## 2020-09-26 DIAGNOSIS — N2581 Secondary hyperparathyroidism of renal origin: Secondary | ICD-10-CM | POA: Diagnosis not present

## 2020-09-26 DIAGNOSIS — Z992 Dependence on renal dialysis: Secondary | ICD-10-CM | POA: Diagnosis not present

## 2020-09-28 ENCOUNTER — Other Ambulatory Visit: Payer: Self-pay

## 2020-09-28 ENCOUNTER — Encounter (HOSPITAL_BASED_OUTPATIENT_CLINIC_OR_DEPARTMENT_OTHER): Payer: Medicare HMO | Admitting: Internal Medicine

## 2020-09-28 DIAGNOSIS — I87323 Chronic venous hypertension (idiopathic) with inflammation of bilateral lower extremity: Secondary | ICD-10-CM | POA: Diagnosis not present

## 2020-09-28 DIAGNOSIS — L97521 Non-pressure chronic ulcer of other part of left foot limited to breakdown of skin: Secondary | ICD-10-CM | POA: Diagnosis not present

## 2020-09-28 DIAGNOSIS — L97412 Non-pressure chronic ulcer of right heel and midfoot with fat layer exposed: Secondary | ICD-10-CM | POA: Diagnosis not present

## 2020-09-28 DIAGNOSIS — E1122 Type 2 diabetes mellitus with diabetic chronic kidney disease: Secondary | ICD-10-CM | POA: Diagnosis not present

## 2020-09-28 DIAGNOSIS — L97411 Non-pressure chronic ulcer of right heel and midfoot limited to breakdown of skin: Secondary | ICD-10-CM | POA: Diagnosis not present

## 2020-09-28 DIAGNOSIS — N184 Chronic kidney disease, stage 4 (severe): Secondary | ICD-10-CM | POA: Diagnosis not present

## 2020-09-28 DIAGNOSIS — L97522 Non-pressure chronic ulcer of other part of left foot with fat layer exposed: Secondary | ICD-10-CM | POA: Diagnosis not present

## 2020-09-28 DIAGNOSIS — E1151 Type 2 diabetes mellitus with diabetic peripheral angiopathy without gangrene: Secondary | ICD-10-CM | POA: Diagnosis not present

## 2020-09-28 DIAGNOSIS — L97822 Non-pressure chronic ulcer of other part of left lower leg with fat layer exposed: Secondary | ICD-10-CM | POA: Diagnosis not present

## 2020-09-28 DIAGNOSIS — E11621 Type 2 diabetes mellitus with foot ulcer: Secondary | ICD-10-CM | POA: Diagnosis not present

## 2020-09-28 DIAGNOSIS — E1142 Type 2 diabetes mellitus with diabetic polyneuropathy: Secondary | ICD-10-CM | POA: Diagnosis not present

## 2020-09-28 NOTE — Progress Notes (Signed)
HIDEKO, ESSELMAN ([id number]) Visit Report for 09/28/2020 HPI Details Patient Name: Date of Service: Kathleen [name] RES M. 09/28/2020 9:00 A M Medical Record Number: [id number] Patient Account Number: [id number] Date of Birth/Sex: Treating RN: 1946-02-28 (75 y.o. [name] Primary Care Provider: Benito Mccreedy Other Clinician: Referring Provider: Treating Provider/Extender: Loma Sender in Treatment: 2 History of Present Illness HPI Description: 05/14/15; this is a 75 year old woman with a history of diabetes who was referred here I think after being seen in the emergency room on 9/29 with sudden edema of the right leg and a large resultant blister on the posterior medial part of the right leg. She had a previous blister about a week before her arrival there however this it open by the time she came to the emergency room. She has no history of known PAD, prior wound. She does have a history of a cardiomyopathy with her last echo on 10/15 showing an EF of 50-55%. She was given clindamycin orally and she is about to complete these in 2 days. She was felt to have cellulitis of the right lower extremity. 05/22/15; her duplex ultrasound was negative for DVT The Kerlix Coban appears to be controlling her edema. The substantial wound area on the right posterior . medial leg looks to be improved with epithelialization. She still has a reasonably substantial open area however I think this is going to heal there is no evidence of infection. Her peripheral pulses are not palpable in this foot. I think this lady probably has some degree of PAD as well. interestingly, she comes in with a crop of blisters just below her knee. These are itchy and if they have been more dermatomal I might of considered zoster. However I think this is probably some form of contact dermatitis question Coban oo 05/28/15. The area on the medial aspect of her right leg continues to be improved  in terms of advancing epithelialization. There is no evidence of infection. She probably has some degree of PAD. The contact dermatitis last blisters in the top of her right leg from last week are resolved. Finally she on the left leg she has a large blister which I opened this was flaccid she has edema in the left leg and I think this is the cause 06/05/15. Her right leg wound is completely epithelialized. There is no open wounds on her right leg. I had to remove the remaining skin from the blister on her left leg however this looks superficial and should be resolved next week. It also looks like she has some friction injury on the top of her right anterior first metatarsal head. I've advised to protect this in her shoes 06/11/15; she has now to open areas one posteriorly on the left leg and one anteriorly on the left. Still poorly controlled edema and may be developing a small additional blister on the right leg. None of this looks to have infection.she is obtained juxtalite stockings at home but she did not bring them today 06/19/15; patient returns today all of her wounds are closed. She has significant venous insufficiency with inflammation right greater than left considerable hemosiderin deposition on the right. We are able to discharge her in her juxtalite stockings. I gave her instructions for lubricating the skin her lower legs twice a day READMISSION; 02/05/16; this is a patient we had in this clinic from 10/7 through 11/4 2016. At that point I felt she had coexistent venous insufficiency and edema and PAD. She  presented with a blister and cellulitis on the medial right leg which ultimately form the wound which progressively epithelialized over several weeks and then resolved. During her stay here she does not seem to pad noninvasive arterial studies. She also developed large flaccid blisters on her left leg that formed wounds as well. We ultimately discharged. Juxtalite stockings to control  the swelling. The patient is a type II diabetic on insulin. He would appear that she has fairly significant chronic renal failure with the last creatinine on 4/22,017 at 3.36. Estimated GFR of 13. Last hemoglobin A1c of 9.0 in April 2017 the patient was seen by her primary physician Dr. Iona Beard Osei-Bonsu. He noted what sounds like cellulitis on the posterior left leg and a clean 6 cm x 5 cm ulcer with surrounding skin redness. She was put on antibiotics. The patient states she has not had any wounds on her legs since she was here in the fall. I'm not exactly sure how she treated the ulcer in her primary physician's office however she arrived with no ulcer on the left leg only eschar that peeled off to reveal normal skin. It is fairly clear that the patient is not wearing her juxtalite stockings. She is not describing shortness breath or chest pain but does describe heaviness in her legs when she walks forcing her to stop and rest. In this clinic her ABI on the right was 0.65 and on the left 0.54. 02/19/16; the patient arrived with a small hemorrhagic blister on the left anterior leg. There is nothing on the right. She's been to see Dr. Gwenlyn Found the formal studies showed an ABI in the right of 0.64 on the left of 0.59. TBI in the right 0.48 0.49 on the left. She had bilateral common femoral artery waves were triphasic. Bilateral popliteal peroneal anterior and posterior tibial arteries were monophasic. She has a follow-up with Dr. Gwenlyn Found next week 02/26/16; left leg blisters healed however she has a right leg blister this was excised with pickups and scissors. She's been to see Dr. Gwenlyn Found who notes her chronic renal failure, the reasonably unimpressive nature of her wounds and did not want to proceed with angiography. He will see her in 3 months 03/04/16; left leg blisters have healed however she did not come in with her juxtalite stockings. She has been to see Dr. Gwenlyn Found did not want to proceed with angiography.  Follow-up in 3 months. She developed a blister on the right last week we excised it. Dimensions are better 09/29/16- the patient made an appointment for a large blister on the anterior aspect of her right lower extremity since making the appointment the blister deroofed and has completely healed and epithelialized. She has not been using her juxta lites that were prescribed nor has she been wearing compression stockings. READMISSION 09/14/2020 This is a patient that we had in the clinic in 2016 and 17. She was last here in February 2018. These were largely for what was felt to be chronic venous insufficiency wounds on her bilateral lower extremities. At one point she had bilateral juxta lite stockings. She was also noted to have diabetic angiopathy with PAD. She went to see Dr. Gwenlyn Found but because the wounds were predominantly venous and she was in stage IV chronic renal failure nothing further was done about her PAD. She comes back in clinic today with wounds on her right heel, right first toe, left first toe and 3 wounds on the left anterior lower extremity. She is complaining of  a lot of pain in the left foot, right heel. She is not a smoker. Past medical history includes coronary artery disease with ischemic cardiomyopathy, type 2 diabetes, coronary artery disease, osteoarthritis, hypertension, chronic renal failure now on dialysis, hyperlipidemia, asthma ABIs in our clinic today were 0.5 on the right and 0.55 on the left 2/14; we do not have an appointment for vascular studies and we continue to work on this. She has areas on her left anterior lower leg which look mostly like venous wounds. She has areas on the right first toe tips right fourth toe tip and right heel. She is complaining a lot of pain however it is difficult to really determine how much of the pain she experiences is secondary to claudication. We wrapped both legs last time however it looks like she has wrap injury on the upper  medial right leg with blistering and excoriations. 2/21; arterial studies are on the 23rd with Dr. Kennon Holter office. The patient's areas on the left anterior lower leg which are mostly venous wounds are closed. On the right the tip of her first toe and fifth toe have eschar I did not remove this. There is still an open area on the right heel were using silver alginate Electronic Signature(s) Signed: 09/28/2020 5:55:48 PM By: Linton Ham MD Entered By: Linton Ham on 09/28/2020 10:11:58 -------------------------------------------------------------------------------- Physical Exam Details Patient Name: Date of Service: Kathleen [name] RES M. 09/28/2020 9:00 A M Medical Record Number: [id number] Patient Account Number: [id number] Date of Birth/Sex: Treating RN: 28-Oct-1945 (75 y.o. [name] Primary Care Provider: Benito Mccreedy Other Clinician: Referring Provider: Treating Provider/Extender: Loma Sender in Treatment: 2 Cardiovascular Pedal pulses absent bilaterally. Feet are cool. Integumentary (Hair, Skin) Skin changes of chronic venous insufficiency bilaterally. Notes Wound exam Left anterior lower leg most of this is epithelialized. Small area on the right fourth and first toes are eschared I did not remove these. She has an open area on the right heel which is small. We are not wrapping the right leg but using kerlix and Coban on the left Electronic Signature(s) Signed: 09/28/2020 5:55:48 PM By: Linton Ham MD Entered By: Linton Ham on 09/28/2020 10:12:55 -------------------------------------------------------------------------------- Physician Orders Details Patient Name: Date of Service: Kathleen [name] RES M. 09/28/2020 9:00 A M Medical Record Number: [id number] Patient Account Number: [id number] Date of Birth/Sex: Treating RN: February 11, 1946 (75 y.o. [name] Primary Care Provider: Benito Mccreedy Other Clinician: Referring  Provider: Treating Provider/Extender: Loma Sender in Treatment: 2 Verbal / Phone Orders: No Diagnosis Coding ICD-10 Coding Code Description E11.51 Type 2 diabetes mellitus with diabetic peripheral angiopathy without gangrene I87.323 Chronic venous hypertension (idiopathic) with inflammation of bilateral lower extremity L97.411 Non-pressure chronic ulcer of right heel and midfoot limited to breakdown of skin L97.521 Non-pressure chronic ulcer of other part of left foot limited to breakdown of skin L97.822 Non-pressure chronic ulcer of other part of left lower leg with fat layer exposed E11.42 Type 2 diabetes mellitus with diabetic polyneuropathy Follow-up Appointments Return Appointment in 1 week. Bathing/ Shower/ Hygiene May shower with protection but do not get wound dressing(s) wet. Edema Control - Lymphedema / SCD / Other Elevate legs to the level of the heart or above for 30 minutes daily and/or when sitting, a frequency of: - throughout the day Avoid standing for long periods of time. Exercise regularly Wound Treatment Wound #10 - T [place] o[place] Wound Laterality: Left Cleanser: Normal Saline Every Other Day/7 Days Discharge  Instructions: Cleanse the wound with Normal Saline or soap and water prior to applying a clean dressing using gauze sponges, not tissue or cotton balls. Peri-Wound Care: Triamcinolone 15 (g) Every Other Day/7 Days Discharge Instructions: Use triamcinolone 15 (g) as directed Peri-Wound Care: Sween Lotion (Moisturizing lotion) Every Other Day/7 Days Discharge Instructions: Apply moisturizing lotion as directed Prim Dressing: KerraCel Ag Gelling Fiber Dressing, 2x2 in (silver alginate) Every Other Day/7 Days ary Discharge Instructions: Apply silver alginate to wound bed as instructed Secondary Dressing: Woven Gauze Sponges 2x2 in Every Other Day/7 Days Discharge Instructions: Apply over primary dressing as directed. Secured With:  Child psychotherapist, Sterile 2x75 (in/in) Every Other Day/7 Days Discharge Instructions: Secure with stretch gauze as directed. Secured With: Paper Tape, 1x10 (in/yd) Every Other Day/7 Days Discharge Instructions: Secure dressing with tape as directed. Wound #11 - Lower Leg Wound Laterality: Left, Medial Cleanser: Soap and Water 1 x Per Week Discharge Instructions: May shower and wash wound with dial antibacterial soap and water prior to dressing change. Peri-Wound Care: Triamcinolone 15 (g) 1 x Per Week Discharge Instructions: Use triamcinolone 15 (g) as directed Peri-Wound Care: Sween Lotion (Moisturizing lotion) 1 x Per Week Discharge Instructions: Apply moisturizing lotion as directed Prim Dressing: KerraCel Ag Gelling Fiber Dressing, 4x5 in (silver alginate) 1 x Per Week ary Discharge Instructions: Apply silver alginate to wound bed as instructed Secondary Dressing: Woven Gauze Sponge, Non-Sterile 4x4 in 1 x Per Week Discharge Instructions: Apply over primary dressing as directed. Secondary Dressing: ABD Pad, 5x9 1 x Per Week Discharge Instructions: Apply over primary dressing as directed. Compression Wrap: Kerlix Roll 4.5x3.1 (in/yd) 1 x Per Week Discharge Instructions: ***WRAP LIGHTLY*** Compression Wrap: Coban Self-Adherent Wrap 4x5 (in/yd) 1 x Per Week Discharge Instructions: ***WRAP LIGHTLY*** Wound #12 - Lower Leg Wound Laterality: Left, Anterior Cleanser: Soap and Water 1 x Per Week Discharge Instructions: May shower and wash wound with dial antibacterial soap and water prior to dressing change. Peri-Wound Care: Triamcinolone 15 (g) 1 x Per Week Discharge Instructions: Use triamcinolone 15 (g) as directed Peri-Wound Care: Sween Lotion (Moisturizing lotion) 1 x Per Week Discharge Instructions: Apply moisturizing lotion as directed Prim Dressing: KerraCel Ag Gelling Fiber Dressing, 4x5 in (silver alginate) 1 x Per Week ary Discharge Instructions: Apply silver  alginate to wound bed as instructed Secondary Dressing: Woven Gauze Sponge, Non-Sterile 4x4 in 1 x Per Week Discharge Instructions: Apply over primary dressing as directed. Secondary Dressing: ABD Pad, 5x9 1 x Per Week Discharge Instructions: Apply over primary dressing as directed. Compression Wrap: Kerlix Roll 4.5x3.1 (in/yd) 1 x Per Week Discharge Instructions: ***WRAP LIGHTLY*** Compression Wrap: Coban Self-Adherent Wrap 4x5 (in/yd) 1 x Per Week Discharge Instructions: ***WRAP LIGHTLY*** Wound #9 - Calcaneus Wound Laterality: Right Cleanser: Soap and Water Every Other Day/7 Days Discharge Instructions: May shower and wash wound with dial antibacterial soap and water prior to dressing change. Cleanser: [employer] Ancillary Kit - 15 Day Supply (Generic) Every Other Day/7 Days Discharge Instructions: Use supplies as instructed; Kit contains: (15) Saline Bullets; (15) 3x3 Gauze; 15 pr Gloves Peri-Wound Care: Triamcinolone 15 (g) Every Other Day/7 Days Discharge Instructions: Use triamcinolone 15 (g) as directed Peri-Wound Care: Sween Lotion (Moisturizing lotion) Every Other Day/7 Days Discharge Instructions: Apply moisturizing lotion as directed Prim Dressing: KerraCel Ag Gelling Fiber Dressing, 2x2 in (silver alginate) (Generic) Every Other Day/7 Days ary Discharge Instructions: Apply silver alginate to wound bed as instructed Secondary Dressing: Woven Gauze Sponge, Non-Sterile 4x4 in (Generic) Every Other Day/7  Days Discharge Instructions: Apply over primary dressing as directed. Secondary Dressing: ALLEVYN Heel 4 1/2in x 5 1/2in / 10.5cm x 13.5cm Every Other Day/7 Days Discharge Instructions: Apply over primary dressing as directed. Secured With: [employer], 4.5x3.1 (in/yd) (Generic) Every Other Day/7 Days Discharge Instructions: Secure with Kerlix as directed. Secured With: Paper Tape, 2x10 (in/yd) (Generic) Every Other Day/7 Days Discharge Instructions: Secure dressing with tape  as directed. Electronic Signature(s) Signed: 09/28/2020 5:41:15 PM By: Levan Hurst RN, BSN Signed: 09/28/2020 5:55:48 PM By: Linton Ham MD Entered By: Levan Hurst on 09/28/2020 10:03:30 -------------------------------------------------------------------------------- Problem List Details Patient Name: Date of Service: Kathleen [name], DELO RES M. 09/28/2020 9:00 A M Medical Record Number: [id number] Patient Account Number: [id number] Date of Birth/Sex: Treating RN: [birth year] (75 y.o. [name] Primary Care Provider: Benito Mccreedy Other Clinician: Referring Provider: Treating Provider/Extender: Loma Sender in Treatment: 2 Active Problems ICD-10 Encounter Code Description Active Date MDM Diagnosis E11.51 Type 2 diabetes mellitus with diabetic peripheral angiopathy without gangrene 09/14/2020 No Yes I87.323 Chronic venous hypertension (idiopathic) with inflammation of bilateral lower 09/14/2020 No Yes extremity L97.411 Non-pressure chronic ulcer of right heel and midfoot limited to breakdown of 09/14/2020 No Yes skin L97.521 Non-pressure chronic ulcer of other part of left foot limited to breakdown of 09/14/2020 No Yes skin L97.822 Non-pressure chronic ulcer of other part of left lower leg with fat layer exposed2/02/2021 No Yes E11.42 Type 2 diabetes mellitus with diabetic polyneuropathy 09/14/2020 No Yes Inactive Problems Resolved Problems Electronic Signature(s) Signed: 09/28/2020 5:55:48 PM By: Linton Ham MD Entered By: Linton Ham on 09/28/2020 10:09:17 -------------------------------------------------------------------------------- Progress Note Details Patient Name: Date of Service: Kathleen [name], DELO RES M. 09/28/2020 9:00 A M Medical Record Number: [id number] Patient Account Number: [id number] Date of Birth/Sex: Treating RN: [birth year] (75 y.o. [name] Primary Care Provider: Benito Mccreedy Other Clinician: Referring  Provider: Treating Provider/Extender: Loma Sender in Treatment: 2 Subjective History of Present Illness (HPI) 05/14/15; this is a 75 year old woman with a history of diabetes who was referred here I think after being seen in the emergency room on 9/29 with sudden edema of the right leg and a large resultant blister on the posterior medial part of the right leg. She had a previous blister about a week before her arrival there however this it open by the time she came to the emergency room. She has no history of known PAD, prior wound. She does have a history of a cardiomyopathy with her last echo on 10/15 showing an EF of 50-55%. She was given clindamycin orally and she is about to complete these in 2 days. She was felt to have cellulitis of the right lower extremity. 05/22/15; her duplex ultrasound was negative for DVT The Kerlix Coban appears to be controlling her edema. The substantial wound area on the right posterior . medial leg looks to be improved with epithelialization. She still has a reasonably substantial open area however I think this is going to heal there is no evidence of infection. Her peripheral pulses are not palpable in this foot. I think this lady probably has some degree of PAD as well. interestingly, she comes in with a crop of blisters just below her knee. These are itchy and if they have been more dermatomal I might of considered zoster. However I think this is probably some form of contact dermatitis question Coban oo 05/28/15. The area on the medial aspect of her right leg continues to be improved  in terms of advancing epithelialization. There is no evidence of infection. She probably has some degree of PAD. The contact dermatitis last blisters in the top of her right leg from last week are resolved. Finally she on the left leg she has a large blister which I opened this was flaccid she has edema in the left leg and I think this is the  cause 06/05/15. Her right leg wound is completely epithelialized. There is no open wounds on her right leg. I had to remove the remaining skin from the blister on her left leg however this looks superficial and should be resolved next week. It also looks like she has some friction injury on the top of her right anterior first metatarsal head. I've advised to protect this in her shoes 06/11/15; she has now to open areas one posteriorly on the left leg and one anteriorly on the left. Still poorly controlled edema and may be developing a small additional blister on the right leg. None of this looks to have infection.she is obtained juxtalite stockings at home but she did not bring them today 06/19/15; patient returns today all of her wounds are closed. She has significant venous insufficiency with inflammation right greater than left considerable hemosiderin deposition on the right. We are able to discharge her in her juxtalite stockings. I gave her instructions for lubricating the skin her lower legs twice a day READMISSION; 02/05/16; this is a patient we had in this clinic from 10/7 through 11/4 2016. At that point I felt she had coexistent venous insufficiency and edema and PAD. She presented with a blister and cellulitis on the medial right leg which ultimately form the wound which progressively epithelialized over several weeks and then resolved. During her stay here she does not seem to pad noninvasive arterial studies. She also developed large flaccid blisters on her left leg that formed wounds as well. We ultimately discharged. Juxtalite stockings to control the swelling. The patient is a type II diabetic on insulin. He would appear that she has fairly significant chronic renal failure with the last creatinine on 4/22,017 at 3.36. Estimated GFR of 13. Last hemoglobin A1c of 9.0 in April 2017 the patient was seen by her primary physician Dr. Iona Beard Osei-Bonsu. He noted what sounds like cellulitis on  the posterior left leg and a clean 6 cm x 5 cm ulcer with surrounding skin redness. She was put on antibiotics. The patient states she has not had any wounds on her legs since she was here in the fall. I'm not exactly sure how she treated the ulcer in her primary physician's office however she arrived with no ulcer on the left leg only eschar that peeled off to reveal normal skin. It is fairly clear that the patient is not wearing her juxtalite stockings. She is not describing shortness breath or chest pain but does describe heaviness in her legs when she walks forcing her to stop and rest. In this clinic her ABI on the right was 0.65 and on the left 0.54. 02/19/16; the patient arrived with a small hemorrhagic blister on the left anterior leg. There is nothing on the right. She's been to see Dr. Gwenlyn Found the formal studies showed an ABI in the right of 0.64 on the left of 0.59. TBI in the right 0.48 0.49 on the left. She had bilateral common femoral artery waves were triphasic. Bilateral popliteal peroneal anterior and posterior tibial arteries were monophasic. She has a follow-up with Dr. Gwenlyn Found next week 02/26/16;  left leg blisters healed however she has a right leg blister this was excised with pickups and scissors. She's been to see Dr. Gwenlyn Found who notes her chronic renal failure, the reasonably unimpressive nature of her wounds and did not want to proceed with angiography. He will see her in 3 months 03/04/16; left leg blisters have healed however she did not come in with her juxtalite stockings. She has been to see Dr. Gwenlyn Found did not want to proceed with angiography. Follow-up in 3 months. She developed a blister on the right last week we excised it. Dimensions are better 09/29/16- the patient made an appointment for a large blister on the anterior aspect of her right lower extremity since making the appointment the blister deroofed and has completely healed and epithelialized. She has not been using her  juxta lites that were prescribed nor has she been wearing compression stockings. READMISSION 09/14/2020 This is a patient that we had in the clinic in 2016 and 17. She was last here in February 2018. These were largely for what was felt to be chronic venous insufficiency wounds on her bilateral lower extremities. At one point she had bilateral juxta lite stockings. She was also noted to have diabetic angiopathy with PAD. She went to see Dr. Gwenlyn Found but because the wounds were predominantly venous and she was in stage IV chronic renal failure nothing further was done about her PAD. She comes back in clinic today with wounds on her right heel, right first toe, left first toe and 3 wounds on the left anterior lower extremity. She is complaining of a lot of pain in the left foot, right heel. She is not a smoker. Past medical history includes coronary artery disease with ischemic cardiomyopathy, type 2 diabetes, coronary artery disease, osteoarthritis, hypertension, chronic renal failure now on dialysis, hyperlipidemia, asthma ABIs in our clinic today were 0.5 on the right and 0.55 on the left 2/14; we do not have an appointment for vascular studies and we continue to work on this. She has areas on her left anterior lower leg which look mostly like venous wounds. She has areas on the right first toe tips right fourth toe tip and right heel. She is complaining a lot of pain however it is difficult to really determine how much of the pain she experiences is secondary to claudication. We wrapped both legs last time however it looks like she has wrap injury on the upper medial right leg with blistering and excoriations. 2/21; arterial studies are on the 23rd with Dr. Kennon Holter office. The patient's areas on the left anterior lower leg which are mostly venous wounds are closed. On the right the tip of her first toe and fifth toe have eschar I did not remove this. There is still an open area on the right heel were  using silver alginate Objective Constitutional Vitals Time Taken: 9:20 AM, Height: 63 in, Weight: 187 lbs, BMI: 33.1, Temperature: 98.4 F, Pulse: 86 bpm, Respiratory Rate: 17 breaths/min, Blood Pressure: 164/75 mmHg. Cardiovascular Pedal pulses absent bilaterally. Feet are cool. General Notes: Wound exam ooLeft anterior lower leg most of this is epithelialized. ooSmall area on the right fourth and first toes are eschared I did not remove these. She has an open area on the right heel which is small. We are not wrapping the right leg but using kerlix and Coban on the left Integumentary (Hair, Skin) Skin changes of chronic venous insufficiency bilaterally. Wound #10 status is Open. Original cause of wound was Blister.  The date acquired was: 07/14/2020. The wound has been in treatment 2 weeks. The wound is located on the Left T Great. The wound measures 0.2cm length x 0.6cm width x 0.1cm depth; 0.094cm^2 area and 0.009cm^3 volume. There is Fat Layer oe (Subcutaneous Tissue) exposed. There is no tunneling or undermining noted. There is a medium amount of serosanguineous drainage noted. The wound margin is distinct with the outline attached to the wound base. There is large (67-100%) red granulation within the wound bed. There is a small (1-33%) amount of necrotic tissue within the wound bed including Adherent Slough. Wound #11 status is Open. Original cause of wound was Blister. The date acquired was: 07/14/2020. The wound has been in treatment 2 weeks. The wound is located on the Left,Medial Lower Leg. The wound measures 0.4cm length x 1.1cm width x 0.1cm depth; 0.346cm^2 area and 0.035cm^3 volume. There is Fat Layer (Subcutaneous Tissue) exposed. There is no tunneling or undermining noted. There is a medium amount of serosanguineous drainage noted. The wound margin is distinct with the outline attached to the wound base. There is large (67-100%) pink, pale granulation within the wound bed. There  is no necrotic tissue within the wound bed. Wound #12 status is Open. Original cause of wound was Blister. The date acquired was: 07/14/2020. The wound has been in treatment 2 weeks. The wound is located on the Left,Anterior Lower Leg. The wound measures 0.2cm length x 0.3cm width x 0.1cm depth; 0.047cm^2 area and 0.005cm^3 volume. There is Fat Layer (Subcutaneous Tissue) exposed. There is no tunneling or undermining noted. There is a medium amount of serosanguineous drainage noted. The wound margin is distinct with the outline attached to the wound base. There is large (67-100%) pink, pale granulation within the wound bed. There is no necrotic tissue within the wound bed. Wound #9 status is Open. Original cause of wound was Blister. The date acquired was: 07/14/2020. The wound has been in treatment 2 weeks. The wound is located on the Right Calcaneus. The wound measures 0.3cm length x 1cm width x 0.1cm depth; 0.236cm^2 area and 0.024cm^3 volume. There is Fat Layer (Subcutaneous Tissue) exposed. There is no tunneling or undermining noted. There is a medium amount of serosanguineous drainage noted. The wound margin is distinct with the outline attached to the wound base. There is medium (34-66%) pink granulation within the wound bed. There is a medium (34-66%) amount of necrotic tissue within the wound bed including Adherent Slough. Assessment Active Problems ICD-10 Type 2 diabetes mellitus with diabetic peripheral angiopathy without gangrene Chronic venous hypertension (idiopathic) with inflammation of bilateral lower extremity Non-pressure chronic ulcer of right heel and midfoot limited to breakdown of skin Non-pressure chronic ulcer of other part of left foot limited to breakdown of skin Non-pressure chronic ulcer of other part of left lower leg with fat layer exposed Type 2 diabetes mellitus with diabetic polyneuropathy Procedures Wound #11 Pre-procedure diagnosis of Wound #11 is a Diabetic  Wound/Ulcer of the Lower Extremity located on the Left,Medial Lower Leg . There was a Double Layer Compression Therapy Procedure by Levan Hurst, RN. Post procedure Diagnosis Wound #11: Same as Pre-Procedure Wound #12 Pre-procedure diagnosis of Wound #12 is a Diabetic Wound/Ulcer of the Lower Extremity located on the Left,Anterior Lower Leg . There was a Double Layer Compression Therapy Procedure by Levan Hurst, RN. Post procedure Diagnosis Wound #12: Same as Pre-Procedure Plan Follow-up Appointments: Return Appointment in 1 week. Bathing/ Shower/ Hygiene: May shower with protection but do not get wound  dressing(s) wet. Edema Control - Lymphedema / SCD / Other: Elevate legs to the level of the heart or above for 30 minutes daily and/or when sitting, a frequency of: - throughout the day Avoid standing for long periods of time. Exercise regularly WOUND #10: - T Great Wound Laterality: Left oe Cleanser: Normal Saline Every Other Day/7 Days Discharge Instructions: Cleanse the wound with Normal Saline or soap and water prior to applying a clean dressing using gauze sponges, not tissue or cotton balls. Peri-Wound Care: Triamcinolone 15 (g) Every Other Day/7 Days Discharge Instructions: Use triamcinolone 15 (g) as directed Peri-Wound Care: Sween Lotion (Moisturizing lotion) Every Other Day/7 Days Discharge Instructions: Apply moisturizing lotion as directed Prim Dressing: KerraCel Ag Gelling Fiber Dressing, 2x2 in (silver alginate) Every Other Day/7 Days ary Discharge Instructions: Apply silver alginate to wound bed as instructed Secondary Dressing: Woven Gauze Sponges 2x2 in Every Other Day/7 Days Discharge Instructions: Apply over primary dressing as directed. Secured With: Child psychotherapist, Sterile 2x75 (in/in) Every Other Day/7 Days Discharge Instructions: Secure with stretch gauze as directed. Secured With: Paper T ape, 1x10 (in/yd) Every Other Day/7 Days Discharge  Instructions: Secure dressing with tape as directed. WOUND #11: - Lower Leg Wound Laterality: Left, Medial Cleanser: Soap and Water 1 x Per Week/ Discharge Instructions: May shower and wash wound with dial antibacterial soap and water prior to dressing change. Peri-Wound Care: Triamcinolone 15 (g) 1 x Per Week/ Discharge Instructions: Use triamcinolone 15 (g) as directed Peri-Wound Care: Sween Lotion (Moisturizing lotion) 1 x Per Week/ Discharge Instructions: Apply moisturizing lotion as directed Prim Dressing: KerraCel Ag Gelling Fiber Dressing, 4x5 in (silver alginate) 1 x Per Week/ ary Discharge Instructions: Apply silver alginate to wound bed as instructed Secondary Dressing: Woven Gauze Sponge, Non-Sterile 4x4 in 1 x Per Week/ Discharge Instructions: Apply over primary dressing as directed. Secondary Dressing: ABD Pad, 5x9 1 x Per Week/ Discharge Instructions: Apply over primary dressing as directed. Com pression Wrap: Kerlix Roll 4.5x3.1 (in/yd) 1 x Per Week/ Discharge Instructions: ***WRAP LIGHTLY*** Com pression Wrap: Coban Self-Adherent Wrap 4x5 (in/yd) 1 x Per Week/ Discharge Instructions: ***WRAP LIGHTLY*** WOUND #12: - Lower Leg Wound Laterality: Left, Anterior Cleanser: Soap and Water 1 x Per Week/ Discharge Instructions: May shower and wash wound with dial antibacterial soap and water prior to dressing change. Peri-Wound Care: Triamcinolone 15 (g) 1 x Per Week/ Discharge Instructions: Use triamcinolone 15 (g) as directed Peri-Wound Care: Sween Lotion (Moisturizing lotion) 1 x Per Week/ Discharge Instructions: Apply moisturizing lotion as directed Prim Dressing: KerraCel Ag Gelling Fiber Dressing, 4x5 in (silver alginate) 1 x Per Week/ ary Discharge Instructions: Apply silver alginate to wound bed as instructed Secondary Dressing: Woven Gauze Sponge, Non-Sterile 4x4 in 1 x Per Week/ Discharge Instructions: Apply over primary dressing as directed. Secondary Dressing: ABD  Pad, 5x9 1 x Per Week/ Discharge Instructions: Apply over primary dressing as directed. Com pression Wrap: Kerlix Roll 4.5x3.1 (in/yd) 1 x Per Week/ Discharge Instructions: ***WRAP LIGHTLY*** Com pression Wrap: Coban Self-Adherent Wrap 4x5 (in/yd) 1 x Per Week/ Discharge Instructions: ***WRAP LIGHTLY*** WOUND #9: - Calcaneus Wound Laterality: Right Cleanser: Soap and Water Every Other Day/7 Days Discharge Instructions: May shower and wash wound with dial antibacterial soap and water prior to dressing change. Cleanser: [employer] Ancillary Kit - 15 Day Supply (Generic) Every Other Day/7 Days Discharge Instructions: Use supplies as instructed; Kit contains: (15) Saline Bullets; (15) 3x3 Gauze; 15 pr Gloves Peri-Wound Care: Triamcinolone 15 (g) Every Other Day/7  Days Discharge Instructions: Use triamcinolone 15 (g) as directed Peri-Wound Care: Sween Lotion (Moisturizing lotion) Every Other Day/7 Days Discharge Instructions: Apply moisturizing lotion as directed Prim Dressing: KerraCel Ag Gelling Fiber Dressing, 2x2 in (silver alginate) (Generic) Every Other Day/7 Days ary Discharge Instructions: Apply silver alginate to wound bed as instructed Secondary Dressing: Woven Gauze Sponge, Non-Sterile 4x4 in (Generic) Every Other Day/7 Days Discharge Instructions: Apply over primary dressing as directed. Secondary Dressing: ALLEVYN Heel 4 1/2in x 5 1/2in / 10.5cm x 13.5cm Every Other Day/7 Days Discharge Instructions: Apply over primary dressing as directed. Secured With: [employer], 4.5x3.1 (in/yd) (Generic) Every Other Day/7 Days Discharge Instructions: Secure with Kerlix as directed. Secured With: Paper T ape, 2x10 (in/yd) (Generic) Every Other Day/7 Days Discharge Instructions: Secure dressing with tape as directed. 1. Continuing to use silver alginate to the right heel 2. Silver alginate to the left anterior lower legs which are just about closed 3. I am repeating her arterial studies but  her wounds actually look better. She has a very limited activity level and her descriptions of pain today were not nearly so dramatic as when she came in here. I am not sure that I would recommend an arteriogram at this point unless she develops further wounds or became more symptomatic Electronic Signature(s) Signed: 09/28/2020 5:55:48 PM By: Linton Ham MD Entered By: Linton Ham on 09/28/2020 10:14:48 -------------------------------------------------------------------------------- SuperBill Details Patient Name: Date of Service: Kathleen [name] RES M. 09/28/2020 Medical Record Number: [id number] Patient Account Number: [id number] Date of Birth/Sex: Treating RN: 06-09-1946 (75 y.o. [name] Primary Care Provider: Benito Mccreedy Other Clinician: Referring Provider: Treating Provider/Extender: Loma Sender in Treatment: 2 Diagnosis Coding ICD-10 Codes Code Description E11.51 Type 2 diabetes mellitus with diabetic peripheral angiopathy without gangrene I87.323 Chronic venous hypertension (idiopathic) with inflammation of bilateral lower extremity L97.411 Non-pressure chronic ulcer of right heel and midfoot limited to breakdown of skin L97.521 Non-pressure chronic ulcer of other part of left foot limited to breakdown of skin L97.822 Non-pressure chronic ulcer of other part of left lower leg with fat layer exposed E11.42 Type 2 diabetes mellitus with diabetic polyneuropathy Facility Procedures CPT4 Code: [id number] Description: (Facility Use Only) [id number] - APPLY MULTLAY COMPRS LWR LT LEG Modifier: Quantity: 1 Physician Procedures : CPT4 Code Description Modifier [id number] - WC PHYS LEVEL 3 - EST PT ICD-10 Diagnosis Description I87.323 Chronic venous hypertension (idiopathic) with inflammation of bilateral lower extremity L97.411 Non-pressure chronic ulcer of right heel and  midfoot limited to breakdown of skin L97.822 Non-pressure chronic  ulcer of other part of left lower leg with fat layer exposed Quantity: 1 Electronic Signature(s) Signed: 09/28/2020 5:41:15 PM By: Levan Hurst RN, BSN Signed: 09/28/2020 5:55:48 PM By: Linton Ham MD Entered By: Levan Hurst on 09/28/2020 13:02:14

## 2020-09-29 ENCOUNTER — Other Ambulatory Visit (HOSPITAL_COMMUNITY): Payer: Self-pay | Admitting: Internal Medicine

## 2020-09-29 DIAGNOSIS — E1151 Type 2 diabetes mellitus with diabetic peripheral angiopathy without gangrene: Secondary | ICD-10-CM

## 2020-09-29 DIAGNOSIS — Z992 Dependence on renal dialysis: Secondary | ICD-10-CM | POA: Diagnosis not present

## 2020-09-29 DIAGNOSIS — N2581 Secondary hyperparathyroidism of renal origin: Secondary | ICD-10-CM | POA: Diagnosis not present

## 2020-09-29 DIAGNOSIS — N186 End stage renal disease: Secondary | ICD-10-CM | POA: Diagnosis not present

## 2020-09-29 DIAGNOSIS — I739 Peripheral vascular disease, unspecified: Secondary | ICD-10-CM

## 2020-09-29 NOTE — Progress Notes (Signed)
[name], [name] ([id number]) Visit Report for 09/28/2020 Arrival Information Details Patient Name: Date of Service: Kathleen [name] RES M. 09/28/2020 9:00 A M Medical Record Number: [id number] Patient Account Number: [id number] Date of Birth/Sex: Treating RN: [birth year] (75 y.o. [name] Primary Care Provider: Benito Mccreedy Other Clinician: Referring Provider: Treating Provider/Extender: Loma Sender in Treatment: 2 Visit Information History Since Last Visit Added or deleted any medications: No Patient Arrived: Wheel Chair Any new allergies or adverse reactions: No Arrival Time: 09:20 Had a fall or experienced change in No Accompanied By: daughter activities of daily living that may affect Transfer Assistance: None risk of falls: Patient Identification Verified: Yes Signs or symptoms of abuse/neglect since last visito No Secondary Verification Process Completed: Yes Hospitalized since last visit: No Patient Requires Transmission-Based Precautions: No Implantable device outside of the clinic excluding No Patient Has Alerts: No cellular tissue based products placed in the center since last visit: Has Dressing in Place as Prescribed: Yes Pain Present Now: Yes Electronic Signature(s) Signed: 09/29/2020 9:47:25 AM By: Sandre Kitty Entered By: Sandre Kitty on 09/28/2020 09:20:21 -------------------------------------------------------------------------------- Compression Therapy Details Patient Name: Date of Service: Kathleen [name] RES M. 09/28/2020 9:00 A M Medical Record Number: [id number] Patient Account Number: [id number] Date of Birth/Sex: Treating RN: 1946-05-02 (75 y.o. [name] Primary Care Provider: Benito Mccreedy Other Clinician: Referring Provider: Treating Provider/Extender: Loma Sender in Treatment: 2 Compression Therapy Performed for Wound Assessment: Wound #11 Left,Medial Lower  Leg Performed By: Clinician Levan Hurst, RN Compression Type: Double Layer Post Procedure Diagnosis Same as Pre-procedure Electronic Signature(s) Signed: 09/28/2020 5:41:15 PM By: Levan Hurst RN, BSN Entered By: Levan Hurst on 09/28/2020 10:02:12 -------------------------------------------------------------------------------- Compression Therapy Details Patient Name: Date of Service: Kathleen [name] RES M. 09/28/2020 9:00 A M Medical Record Number: [id number] Patient Account Number: [id number] Date of Birth/Sex: Treating RN: [birth year] (75 y.o. [name] Primary Care Provider: Benito Mccreedy Other Clinician: Referring Provider: Treating Provider/Extender: Loma Sender in Treatment: 2 Compression Therapy Performed for Wound Assessment: Wound #12 Left,Anterior Lower Leg Performed By: Clinician Levan Hurst, RN Compression Type: Double Layer Post Procedure Diagnosis Same as Pre-procedure Electronic Signature(s) Signed: 09/28/2020 5:41:15 PM By: Levan Hurst RN, BSN Entered By: Levan Hurst on 09/28/2020 10:02:12 -------------------------------------------------------------------------------- Lower Extremity Assessment Details Patient Name: Date of Service: Kathleen [name] RES M. 09/28/2020 9:00 A M Medical Record Number: [id number] Patient Account Number: [id number] Date of Birth/Sex: Treating RN: 12-Sep-1945 (75 y.o. [name] Primary Care Provider: Benito Mccreedy Other Clinician: Referring Provider: Treating Provider/Extender: Loma Sender in Treatment: 2 Edema Assessment Assessed: Shirlyn Goltz: Yes] Patrice Paradise: Yes] Edema: [Left: No] [Right: No] Calf Left: Right: Point of Measurement: 28 cm From Medial Instep 33 cm 36 cm Ankle Left: Right: Point of Measurement: 9 cm From Medial Instep 19.5 cm 21 cm Vascular Assessment Pulses: Dorsalis Pedis Palpable: [Left:No] [Right:No] Posterior  Tibial Palpable: [Left:No] [Right:No] Electronic Signature(s) Signed: 09/28/2020 6:14:41 PM By: Deon Pilling Entered By: Deon Pilling on 09/28/2020 09:42:49 -------------------------------------------------------------------------------- Multi Wound Chart Details Patient Name: Date of Service: Kathleen [name] RES M. 09/28/2020 9:00 A M Medical Record Number: [id number] Patient Account Number: [id number] Date of Birth/Sex: Treating RN: Sep 04, 1945 (75 y.o. [name] Primary Care Provider: Benito Mccreedy Other Clinician: Referring Provider: Treating Provider/Extender: Loma Sender in Treatment: 2 Vital Signs Height(in): 63 Pulse(bpm): 86 Weight(lbs): 187 Blood Pressure(mmHg): 164/75 Body Mass Index(BMI): 33 Temperature(F): 98.4 Respiratory Rate(breaths/min): 17  Photos: [10:No Photos Left T Great oe] [11:No Photos Left, Medial Lower Leg] [12:No Photos Left, Anterior Lower Leg] Wound Location: [10:Blister] [11:Blister] [12:Blister] Wounding Event: [10:Diabetic Wound/Ulcer of the Lower] [11:Diabetic Wound/Ulcer of the Lower] [12:Diabetic Wound/Ulcer of the Lower] Primary Etiology: [10:Extremity Arterial Insufficiency Ulcer] [11:Extremity Venous Leg Ulcer] [12:Extremity Venous Leg Ulcer] Secondary Etiology: [10:Cataracts, Glaucoma, Anemia,] [11:Cataracts, Glaucoma, Anemia,] [12:Cataracts, Glaucoma, Anemia,] Comorbid History: [10:Asthma, Congestive Heart Failure, Coronary Artery Disease, Hypertension, Peripheral Arterial Disease, Type II Diabetes, Osteoarthritis 07/14/2020] [11:Asthma, Congestive Heart Failure, Coronary Artery Disease, Hypertension,  Peripheral Arterial Disease, Type II Diabetes, Osteoarthritis 07/14/2020] [12:Asthma, Congestive Heart Failure, Coronary Artery Disease, Hypertension, Peripheral Arterial Disease, Type II Diabetes, Osteoarthritis 07/14/2020] Date Acquired: [10:2] [11:2] [12:2] Weeks of Treatment: [10:Open] [11:Open]  [12:Open] Wound Status: [10:0.2x0.6x0.1] [11:0.4x1.1x0.1] [12:0.2x0.3x0.1] Measurements L x W x D (cm) [10:0.094] [11:0.346] [12:0.047] A (cm) : rea [10:0.009] [11:0.035] [12:0.005] Volume (cm) : [10:73.40%] [11:96.30%] [12:95.00%] % Reduction in A rea: [10:74.30%] [11:96.30%] [12:94.70%] % Reduction in Volume: [10:Grade 2] [11:Grade 2] [12:Grade 2] Classification: [10:Medium] [11:Medium] [12:Medium] Exudate A mount: [10:Serosanguineous] [11:Serosanguineous] [12:Serosanguineous] Exudate Type: [10:red, brown] [11:red, brown] [12:red, brown] Exudate Color: [10:Distinct, outline attached] [11:Distinct, outline attached] [12:Distinct, outline attached] Wound Margin: [10:Large (67-100%)] [11:Large (67-100%)] [12:Large (67-100%)] Granulation A mount: [10:Red] [11:Pink, Pale] [12:Pink, Pale] Granulation Quality: [10:Small (1-33%)] [11:None Present (0%)] [12:None Present (0%)] Necrotic A mount: [10:Fat Layer (Subcutaneous Tissue): Yes Fat Layer (Subcutaneous Tissue): Yes Fat Layer (Subcutaneous Tissue): Yes] Exposed Structures: [10:Fascia: No Tendon: No Muscle: No Joint: No Bone: No Small (1-33%)] [11:Fascia: No Tendon: No Muscle: No Joint: No Bone: No Medium (34-66%)] [12:Fascia: No Tendon: No Muscle: No Joint: No Bone: No Large (67-100%)] Epithelialization: [10:N/A] [11:Compression Therapy] [12:Compression Therapy] Wound Number: 9 N/A N/A Photos: No Photos N/A N/A Right Calcaneus N/A N/A Wound Location: Blister N/A N/A Wounding Event: Diabetic Wound/Ulcer of the Lower N/A N/A Primary Etiology: Extremity N/A N/A N/A Secondary Etiology: Cataracts, Glaucoma, Anemia, N/A N/A Comorbid History: Asthma, Congestive Heart Failure, Coronary Artery Disease, Hypertension, Peripheral Arterial Disease, Type II Diabetes, Osteoarthritis 07/14/2020 N/A N/A Date Acquired: 2 N/A N/A Weeks of Treatment: Open N/A N/A Wound Status: 0.3x1x0.1 N/A N/A Measurements L x W x D (cm) 0.236 N/A N/A A  (cm) : rea 0.024 N/A N/A Volume (cm) : 91.20% N/A N/A % Reduction in A rea: 91.00% N/A N/A % Reduction in Volume: Grade 2 N/A N/A Classification: Medium N/A N/A Exudate A mount: Serosanguineous N/A N/A Exudate Type: red, brown N/A N/A Exudate Color: Distinct, outline attached N/A N/A Wound Margin: Medium (34-66%) N/A N/A Granulation A mount: Pink N/A N/A Granulation Quality: Medium (34-66%) N/A N/A Necrotic A mount: Fat Layer (Subcutaneous Tissue): Yes N/A N/A Exposed Structures: Fascia: No Tendon: No Muscle: No Joint: No Bone: No Large (67-100%) N/A N/A Epithelialization: N/A N/A N/A Procedures Performed: Treatment Notes Electronic Signature(s) Signed: 09/28/2020 5:41:15 PM By: Levan Hurst RN, BSN Signed: 09/28/2020 5:55:48 PM By: Linton Ham MD Entered By: Linton Ham on 09/28/2020 10:10:02 -------------------------------------------------------------------------------- Multi-Disciplinary Care Plan Details Patient Name: Date of Service: Kathleen [name] RES M. 09/28/2020 9:00 A M Medical Record Number: [id number] Patient Account Number: [id number] Date of Birth/Sex: Treating RN: 1946-06-09 (75 y.o. [name] Primary Care Provider: Benito Mccreedy Other Clinician: Referring Provider: Treating Provider/Extender: Loma Sender in Treatment: 2 Multidisciplinary Care Plan reviewed with physician Active Inactive Abuse / Safety / Falls / Self Care Management Nursing Diagnoses: Potential for falls Potential for injury related to falls Goals: Patient will not experience  any injury related to falls Date Initiated: 09/14/2020 Target Resolution Date: 10/16/2020 Goal Status: Active Patient/caregiver will verbalize/demonstrate measures taken to prevent injury and/or falls Date Initiated: 09/14/2020 Target Resolution Date: 10/16/2020 Goal Status: Active Interventions: Assess Activities of Daily Living upon admission and as  needed Assess fall risk on admission and as needed Assess: immobility, friction, shearing, incontinence upon admission and as needed Assess impairment of mobility on admission and as needed per policy Assess personal safety and home safety (as indicated) on admission and as needed Assess self care needs on admission and as needed Provide education on fall prevention Provide education on personal and home safety Notes: Nutrition Nursing Diagnoses: Impaired glucose control: actual or potential Potential for alteratiion in Nutrition/Potential for imbalanced nutrition Goals: Patient/caregiver agrees to and verbalizes understanding of need to use nutritional supplements and/or vitamins as prescribed Date Initiated: 09/14/2020 Target Resolution Date: 10/16/2020 Goal Status: Active Patient/caregiver will maintain therapeutic glucose control Date Initiated: 09/14/2020 Target Resolution Date: 10/16/2020 Goal Status: Active Interventions: Assess HgA1c results as ordered upon admission and as needed Assess patient nutrition upon admission and as needed per policy Provide education on elevated blood sugars and impact on wound healing Provide education on nutrition Treatment Activities: Education provided on Nutrition : 09/14/2020 Notes: Venous Leg Ulcer Nursing Diagnoses: Knowledge deficit related to disease process and management Goals: Patient/caregiver will verbalize understanding of disease process and disease management Date Initiated: 09/14/2020 Target Resolution Date: 10/16/2020 Goal Status: Active Interventions: Assess peripheral edema status every visit. Notes: Wound/Skin Impairment Nursing Diagnoses: Impaired tissue integrity Knowledge deficit related to ulceration/compromised skin integrity Goals: Patient/caregiver will verbalize understanding of skin care regimen Date Initiated: 09/14/2020 Target Resolution Date: 10/16/2020 Goal Status: Active Interventions: Assess  patient/caregiver ability to obtain necessary supplies Assess patient/caregiver ability to perform ulcer/skin care regimen upon admission and as needed Assess ulceration(s) every visit Provide education on ulcer and skin care Notes: Electronic Signature(s) Signed: 09/28/2020 5:41:15 PM By: Levan Hurst RN, BSN Entered By: Levan Hurst on 09/28/2020 09:27:16 -------------------------------------------------------------------------------- Pain Assessment Details Patient Name: Date of Service: Kathleen [name] RES M. 09/28/2020 9:00 A M Medical Record Number: [id number] Patient Account Number: [id number] Date of Birth/Sex: Treating RN: 22-Mar-1946 (75 y.o. [name] Primary Care Provider: Benito Mccreedy Other Clinician: Referring Provider: Treating Provider/Extender: Loma Sender in Treatment: 2 Active Problems Location of Pain Severity and Description of Pain Patient Has Paino Yes Site Locations Rate the pain. Rate the pain. Current Pain Level: 3 Pain Management and Medication Current Pain Management: Electronic Signature(s) Signed: 09/28/2020 5:41:15 PM By: Levan Hurst RN, BSN Signed: 09/29/2020 9:47:25 AM By: Sandre Kitty Entered By: Sandre Kitty on 09/28/2020 09:20:55 -------------------------------------------------------------------------------- Patient/Caregiver Education Details Patient Name: Date of Service: Kathleen [name] RES M. 2/21/2022andnbsp9:00 A M Medical Record Number: [id number] Patient Account Number: [id number] Date of Birth/Gender: Treating RN: 03-20-1946 (75 y.o. [name] Primary Care Physician: Benito Mccreedy Other Clinician: Referring Physician: Treating Physician/Extender: Loma Sender in Treatment: 2 Education Assessment Education Provided To: Patient Education Topics Provided Wound/Skin Impairment: Methods: Explain/Verbal Responses: State content  correctly Electronic Signature(s) Signed: 09/28/2020 5:41:15 PM By: Levan Hurst RN, BSN Entered By: Levan Hurst on 09/28/2020 09:27:26 -------------------------------------------------------------------------------- Wound Assessment Details Patient Name: Date of Service: Kathleen [name] RES M. 09/28/2020 9:00 A M Medical Record Number: [id number] Patient Account Number: [id number] Date of Birth/Sex: Treating RN: June 26, 1946 (75 y.o. [name] Primary Care Provider: Benito Mccreedy Other Clinician: Referring Provider: Treating Provider/Extender: Cammy Brochure  Weeks in Treatment: 2 Wound Status Wound Number: 10 Primary Diabetic Wound/Ulcer of the Lower Extremity Etiology: Wound Location: Left T Great oe Secondary Arterial Insufficiency Ulcer Wounding Event: Blister Etiology: Date Acquired: 07/14/2020 Wound Open Weeks Of Treatment: 2 Status: Clustered Wound: No Comorbid Cataracts, Glaucoma, Anemia, Asthma, Congestive Heart Failure, History: Coronary Artery Disease, Hypertension, Peripheral Arterial Disease, Type II Diabetes, Osteoarthritis Photos Photo Uploaded By: Mikeal Hawthorne on 09/28/2020 14:56:48 Wound Measurements Length: (cm) 0.2 Width: (cm) 0.6 Depth: (cm) 0.1 Area: (cm) 0.094 Volume: (cm) 0.009 % Reduction in Area: 73.4% % Reduction in Volume: 74.3% Epithelialization: Small (1-33%) Tunneling: No Undermining: No Wound Description Classification: Grade 2 Wound Margin: Distinct, outline attached Exudate Amount: Medium Exudate Type: Serosanguineous Exudate Color: red, brown Foul Odor After Cleansing: No Slough/Fibrino Yes Wound Bed Granulation Amount: Large (67-100%) Exposed Structure Granulation Quality: Red Fascia Exposed: No Necrotic Amount: Small (1-33%) Fat Layer (Subcutaneous Tissue) Exposed: Yes Necrotic Quality: Adherent Slough Tendon Exposed: No Muscle Exposed: No Joint Exposed: No Bone Exposed:  No Electronic Signature(s) Signed: 09/28/2020 5:41:15 PM By: Levan Hurst RN, BSN Signed: 09/28/2020 6:14:41 PM By: Deon Pilling Entered By: Deon Pilling on 09/28/2020 09:43:43 -------------------------------------------------------------------------------- Wound Assessment Details Patient Name: Date of Service: Kathleen [name] RES M. 09/28/2020 9:00 A M Medical Record Number: [id number] Patient Account Number: [id number] Date of Birth/Sex: Treating RN: 1946/02/12 (75 y.o. [name] Primary Care Provider: Benito Mccreedy Other Clinician: Referring Provider: Treating Provider/Extender: Loma Sender in Treatment: 2 Wound Status Wound Number: 11 Primary Diabetic Wound/Ulcer of the Lower Extremity Etiology: Wound Location: Left, Medial Lower Leg Secondary Venous Leg Ulcer Wounding Event: Blister Etiology: Date Acquired: 07/14/2020 Wound Open Weeks Of Treatment: 2 Status: Clustered Wound: No Comorbid Cataracts, Glaucoma, Anemia, Asthma, Congestive Heart Failure, History: Coronary Artery Disease, Hypertension, Peripheral Arterial Disease, Type II Diabetes, Osteoarthritis Photos Photo Uploaded By: Mikeal Hawthorne on 09/28/2020 14:57:16 Wound Measurements Length: (cm) 0.4 Width: (cm) 1.1 Depth: (cm) 0.1 Area: (cm) 0.346 Volume: (cm) 0.035 % Reduction in Area: 96.3% % Reduction in Volume: 96.3% Epithelialization: Medium (34-66%) Tunneling: No Undermining: No Wound Description Classification: Grade 2 Wound Margin: Distinct, outline attached Exudate Amount: Medium Exudate Type: Serosanguineous Exudate Color: red, brown Foul Odor After Cleansing: No Slough/Fibrino No Wound Bed Granulation Amount: Large (67-100%) Exposed Structure Granulation Quality: Pink, Pale Fascia Exposed: No Necrotic Amount: None Present (0%) Fat Layer (Subcutaneous Tissue) Exposed: Yes Tendon Exposed: No Muscle Exposed: No Joint Exposed: No Bone Exposed:  No Electronic Signature(s) Signed: 09/28/2020 5:41:15 PM By: Levan Hurst RN, BSN Signed: 09/28/2020 6:14:41 PM By: Deon Pilling Entered By: Deon Pilling on 09/28/2020 09:43:59 -------------------------------------------------------------------------------- Wound Assessment Details Patient Name: Date of Service: Kathleen [name] RES M. 09/28/2020 9:00 A M Medical Record Number: [id number] Patient Account Number: [id number] Date of Birth/Sex: Treating RN: March 25, 1946 (75 y.o. [name] Primary Care Provider: Benito Mccreedy Other Clinician: Referring Provider: Treating Provider/Extender: Loma Sender in Treatment: 2 Wound Status Wound Number: 12 Primary Diabetic Wound/Ulcer of the Lower Extremity Etiology: Wound Location: Left, Anterior Lower Leg Secondary Venous Leg Ulcer Wounding Event: Blister Etiology: Date Acquired: 07/14/2020 Wound Open Weeks Of Treatment: 2 Status: Clustered Wound: No Comorbid Cataracts, Glaucoma, Anemia, Asthma, Congestive Heart Failure, History: Coronary Artery Disease, Hypertension, Peripheral Arterial Disease, Type II Diabetes, Osteoarthritis Photos Photo Uploaded By: Mikeal Hawthorne on 09/28/2020 14:57:00 Wound Measurements Length: (cm) 0.2 Width: (cm) 0.3 Depth: (cm) 0.1 Area: (cm) 0.047 Volume: (cm) 0.005 % Reduction in Area: 95% % Reduction in  Volume: 94.7% Epithelialization: Large (67-100%) Tunneling: No Undermining: No Wound Description Classification: Grade 2 Wound Margin: Distinct, outline attached Exudate Amount: Medium Exudate Type: Serosanguineous Exudate Color: red, brown Foul Odor After Cleansing: No Slough/Fibrino No Wound Bed Granulation Amount: Large (67-100%) Exposed Structure Granulation Quality: Pink, Pale Fascia Exposed: No Necrotic Amount: None Present (0%) Fat Layer (Subcutaneous Tissue) Exposed: Yes Tendon Exposed: No Muscle Exposed: No Joint Exposed: No Bone Exposed:  No Electronic Signature(s) Signed: 09/28/2020 5:41:15 PM By: Levan Hurst RN, BSN Signed: 09/28/2020 6:14:41 PM By: Deon Pilling Entered By: Deon Pilling on 09/28/2020 09:44:12 -------------------------------------------------------------------------------- Wound Assessment Details Patient Name: Date of Service: Kathleen [name] RES M. 09/28/2020 9:00 A M Medical Record Number: [id number] Patient Account Number: [id number] Date of Birth/Sex: Treating RN: [birth year] (75 y.o. [name] Primary Care Provider: Benito Mccreedy Other Clinician: Referring Provider: Treating Provider/Extender: Loma Sender in Treatment: 2 Wound Status Wound Number: 9 Primary Diabetic Wound/Ulcer of the Lower Extremity Etiology: Wound Location: Right Calcaneus Wound Open Wounding Event: Blister Status: Date Acquired: 07/14/2020 Comorbid Cataracts, Glaucoma, Anemia, Asthma, Congestive Heart Failure, Weeks Of Treatment: 2 History: Coronary Artery Disease, Hypertension, Peripheral Arterial Disease, Clustered Wound: No Clustered Wound: No Type II Diabetes, Osteoarthritis Photos Photo Uploaded By: Mikeal Hawthorne on 09/28/2020 14:56:30 Wound Measurements Length: (cm) 0.3 Width: (cm) 1 Depth: (cm) 0.1 Area: (cm) 0.236 Volume: (cm) 0.024 % Reduction in Area: 91.2% % Reduction in Volume: 91% Epithelialization: Large (67-100%) Tunneling: No Undermining: No Wound Description Classification: Grade 2 Wound Margin: Distinct, outline attached Exudate Amount: Medium Exudate Type: Serosanguineous Exudate Color: red, brown Foul Odor After Cleansing: No Slough/Fibrino Yes Wound Bed Granulation Amount: Medium (34-66%) Exposed Structure Granulation Quality: Pink Fascia Exposed: No Necrotic Amount: Medium (34-66%) Fat Layer (Subcutaneous Tissue) Exposed: Yes Necrotic Quality: Adherent Slough Tendon Exposed: No Muscle Exposed: No Joint Exposed: No Bone Exposed:  No Electronic Signature(s) Signed: 09/28/2020 5:41:15 PM By: Levan Hurst RN, BSN Signed: 09/28/2020 6:14:41 PM By: Deon Pilling Entered By: Deon Pilling on 09/28/2020 09:44:42 -------------------------------------------------------------------------------- Vitals Details Patient Name: Date of Service: [name], DELO RES M. 09/28/2020 9:00 A M Medical Record Number: [id number] Patient Account Number: [id number] Date of Birth/Sex: Treating RN: 05/06/1946 (75 y.o. [name] Primary Care Provider: Benito Mccreedy Other Clinician: Referring Provider: Treating Provider/Extender: Loma Sender in Treatment: 2 Vital Signs Time Taken: 09:20 Temperature (F): 98.4 Height (in): 63 Pulse (bpm): 86 Weight (lbs): 187 Respiratory Rate (breaths/min): 17 Body Mass Index (BMI): 33.1 Blood Pressure (mmHg): 164/75 Reference Range: 80 - 120 mg / dl Electronic Signature(s) Signed: 09/29/2020 9:47:25 AM By: Sandre Kitty Entered By: Sandre Kitty on 09/28/2020 09:20:40

## 2020-09-30 ENCOUNTER — Other Ambulatory Visit: Payer: Self-pay

## 2020-09-30 ENCOUNTER — Ambulatory Visit (HOSPITAL_COMMUNITY)
Admission: RE | Admit: 2020-09-30 | Discharge: 2020-09-30 | Disposition: A | Discharge status: Home / Self Care | Payer: Medicare HMO | Source: Ambulatory Visit | Attending: Cardiovascular Disease | Admitting: Cardiovascular Disease

## 2020-09-30 DIAGNOSIS — E1151 Type 2 diabetes mellitus with diabetic peripheral angiopathy without gangrene: Secondary | ICD-10-CM | POA: Diagnosis not present

## 2020-09-30 DIAGNOSIS — I739 Peripheral vascular disease, unspecified: Secondary | ICD-10-CM | POA: Insufficient documentation

## 2020-10-01 DIAGNOSIS — N2581 Secondary hyperparathyroidism of renal origin: Secondary | ICD-10-CM | POA: Diagnosis not present

## 2020-10-01 DIAGNOSIS — N186 End stage renal disease: Secondary | ICD-10-CM | POA: Diagnosis not present

## 2020-10-01 DIAGNOSIS — Z992 Dependence on renal dialysis: Secondary | ICD-10-CM | POA: Diagnosis not present

## 2020-10-03 DIAGNOSIS — N2581 Secondary hyperparathyroidism of renal origin: Secondary | ICD-10-CM | POA: Diagnosis not present

## 2020-10-03 DIAGNOSIS — N186 End stage renal disease: Secondary | ICD-10-CM | POA: Diagnosis not present

## 2020-10-03 DIAGNOSIS — Z992 Dependence on renal dialysis: Secondary | ICD-10-CM | POA: Diagnosis not present

## 2020-10-05 ENCOUNTER — Encounter (HOSPITAL_BASED_OUTPATIENT_CLINIC_OR_DEPARTMENT_OTHER): Payer: Medicare HMO | Admitting: Internal Medicine

## 2020-10-06 DIAGNOSIS — Z992 Dependence on renal dialysis: Secondary | ICD-10-CM | POA: Diagnosis not present

## 2020-10-06 DIAGNOSIS — N2581 Secondary hyperparathyroidism of renal origin: Secondary | ICD-10-CM | POA: Diagnosis not present

## 2020-10-06 DIAGNOSIS — N186 End stage renal disease: Secondary | ICD-10-CM | POA: Diagnosis not present

## 2020-10-07 DIAGNOSIS — E785 Hyperlipidemia, unspecified: Secondary | ICD-10-CM | POA: Diagnosis not present

## 2020-10-07 DIAGNOSIS — E119 Type 2 diabetes mellitus without complications: Secondary | ICD-10-CM | POA: Diagnosis not present

## 2020-10-07 DIAGNOSIS — G4709 Other insomnia: Secondary | ICD-10-CM | POA: Diagnosis not present

## 2020-10-07 DIAGNOSIS — N186 End stage renal disease: Secondary | ICD-10-CM | POA: Diagnosis not present

## 2020-10-07 DIAGNOSIS — Z1389 Encounter for screening for other disorder: Secondary | ICD-10-CM | POA: Diagnosis not present

## 2020-10-07 DIAGNOSIS — Z992 Dependence on renal dialysis: Secondary | ICD-10-CM | POA: Diagnosis not present

## 2020-10-07 DIAGNOSIS — I10 Essential (primary) hypertension: Secondary | ICD-10-CM | POA: Diagnosis not present

## 2020-10-07 DIAGNOSIS — Z720 Tobacco use: Secondary | ICD-10-CM | POA: Diagnosis not present

## 2020-10-09 ENCOUNTER — Other Ambulatory Visit: Payer: Self-pay

## 2020-10-09 ENCOUNTER — Encounter: Payer: Self-pay | Admitting: Cardiovascular Disease

## 2020-10-09 ENCOUNTER — Ambulatory Visit (INDEPENDENT_AMBULATORY_CARE_PROVIDER_SITE_OTHER): Payer: Medicare HMO | Admitting: Cardiovascular Disease

## 2020-10-09 ENCOUNTER — Encounter (HOSPITAL_BASED_OUTPATIENT_CLINIC_OR_DEPARTMENT_OTHER): Payer: Medicare HMO | Attending: Internal Medicine | Admitting: Physician Assistant

## 2020-10-09 VITALS — BP 128/64 | HR 80 | Ht 63.0 in | Wt 187.0 lb

## 2020-10-09 DIAGNOSIS — I132 Hypertensive heart and chronic kidney disease with heart failure and with stage 5 chronic kidney disease, or end stage renal disease: Secondary | ICD-10-CM | POA: Diagnosis not present

## 2020-10-09 DIAGNOSIS — I2510 Atherosclerotic heart disease of native coronary artery without angina pectoris: Secondary | ICD-10-CM | POA: Diagnosis not present

## 2020-10-09 DIAGNOSIS — I10 Essential (primary) hypertension: Secondary | ICD-10-CM

## 2020-10-09 DIAGNOSIS — E1122 Type 2 diabetes mellitus with diabetic chronic kidney disease: Secondary | ICD-10-CM | POA: Insufficient documentation

## 2020-10-09 DIAGNOSIS — L97822 Non-pressure chronic ulcer of other part of left lower leg with fat layer exposed: Secondary | ICD-10-CM | POA: Insufficient documentation

## 2020-10-09 DIAGNOSIS — E1142 Type 2 diabetes mellitus with diabetic polyneuropathy: Secondary | ICD-10-CM | POA: Insufficient documentation

## 2020-10-09 DIAGNOSIS — E11621 Type 2 diabetes mellitus with foot ulcer: Secondary | ICD-10-CM | POA: Insufficient documentation

## 2020-10-09 DIAGNOSIS — I87323 Chronic venous hypertension (idiopathic) with inflammation of bilateral lower extremity: Secondary | ICD-10-CM | POA: Diagnosis not present

## 2020-10-09 DIAGNOSIS — Z992 Dependence on renal dialysis: Secondary | ICD-10-CM | POA: Diagnosis not present

## 2020-10-09 DIAGNOSIS — R0989 Other specified symptoms and signs involving the circulatory and respiratory systems: Secondary | ICD-10-CM | POA: Diagnosis not present

## 2020-10-09 DIAGNOSIS — I509 Heart failure, unspecified: Secondary | ICD-10-CM | POA: Diagnosis not present

## 2020-10-09 DIAGNOSIS — Z794 Long term (current) use of insulin: Secondary | ICD-10-CM | POA: Insufficient documentation

## 2020-10-09 DIAGNOSIS — L97521 Non-pressure chronic ulcer of other part of left foot limited to breakdown of skin: Secondary | ICD-10-CM | POA: Diagnosis not present

## 2020-10-09 DIAGNOSIS — N186 End stage renal disease: Secondary | ICD-10-CM | POA: Diagnosis not present

## 2020-10-09 DIAGNOSIS — E11622 Type 2 diabetes mellitus with other skin ulcer: Secondary | ICD-10-CM | POA: Insufficient documentation

## 2020-10-09 DIAGNOSIS — I255 Ischemic cardiomyopathy: Secondary | ICD-10-CM | POA: Diagnosis not present

## 2020-10-09 DIAGNOSIS — I739 Peripheral vascular disease, unspecified: Secondary | ICD-10-CM

## 2020-10-09 DIAGNOSIS — E1151 Type 2 diabetes mellitus with diabetic peripheral angiopathy without gangrene: Secondary | ICD-10-CM | POA: Diagnosis not present

## 2020-10-09 DIAGNOSIS — I872 Venous insufficiency (chronic) (peripheral): Secondary | ICD-10-CM | POA: Diagnosis not present

## 2020-10-09 DIAGNOSIS — L97411 Non-pressure chronic ulcer of right heel and midfoot limited to breakdown of skin: Secondary | ICD-10-CM | POA: Insufficient documentation

## 2020-10-09 DIAGNOSIS — I2583 Coronary atherosclerosis due to lipid rich plaque: Secondary | ICD-10-CM | POA: Diagnosis not present

## 2020-10-09 NOTE — Progress Notes (Signed)
10/09/2020 [place]   1945-08-16  [id number]  Primary Physician Kathleen Mccreedy, MD Primary Cardiologist: Kathleen Harp MD FACP, [place], [place], [place]  HPI:  Kathleen [name] is a 75 y.o.  mildly overweight divorced African-American female mother of 3 children who is accompanied by one of her daughters Kathleen [name].  Her cardiologist is Dr. Johnsie [name] . She was referred by Kathleen [name] at the wound care center for peripheral vascular evaluation. She does have a history of congestive heart failure with an ejection fraction of 40%. Her cardiac risk factors include treated hypertension, diabetes and hyperlipidemia. She smoked greater than 50 pack years and stopped about a year ago. She has chronic renal insufficiency with a creatinine in the mid 3 range. She is followed by Dr. Florene [name]. She has had pretibial ulcers treated at the wound care center. Recent Dopplers performed 02/17/16 revealed high frequency signals in both SFAs with ABIs in the 0.6 range bilaterally.   I last saw her 5 years ago.  Since that time she is going on hemodialysis several years ago.  She lives with one of her sons Kathleen [name] and his 2 children.  She spends majority of the day in a reclining chair and is minimally to nonambulatory.  She does continue to see Kathleen [name] for wound care.  Has her most recent Dopplers performed 09/30/2020 revealed a right ABI of 0.75 and a left of 0.55.  She did have a high-frequency signal in her distal right common femoral artery and right SFA.  At this point, she has no open wounds or evidence of critical limb ischemia that require intervention.   Current Meds  Medication Sig  . acetaminophen (TYLENOL) 325 MG tablet Take 2 tablets (650 mg total) by mouth every 4 (four) hours as needed for mild pain (or temp > 37.5 C (99.5 F)).  [name] atorvastatin (LIPITOR) 40 MG tablet Take 1 tablet (40 mg total) by mouth daily at 6 PM.  . calcium acetate (PHOSLO) 667 MG capsule Take 1 capsule (667 mg total) by mouth as  needed (with snacks). (Patient taking differently: Take 667-2,001 mg by mouth See admin instructions. Take 3 capsules (2001mg ) with a meal three times a day. Take 667 mg with snack)  . carvedilol (COREG) 25 MG tablet Take 25 mg by mouth 2 (two) times daily.  . hydrALAZINE (APRESOLINE) 25 MG tablet Take 1 tablet (25 mg total) by mouth every 8 (eight) hours as needed (SBP > 170  or DBP > 110). (Patient taking differently: Take 50 mg by mouth 3 (three) times daily.)  . insulin detemir (LEVEMIR) 100 UNIT/ML injection Inject 0.15 mLs (15 Units total) into the skin at bedtime. (Patient taking differently: Inject 30 Units into the skin at bedtime.)  . pantoprazole (PROTONIX) 40 MG tablet Take 1 tablet (40 mg total) by mouth 2 (two) times daily before a meal. Take 1 tablet twice daily for a  Month then once dailly (Patient taking differently: Take 40 mg by mouth 2 (two) times daily before a meal. Take 1 tablet twice daily for a  Month then once dailly)  . polyethylene glycol (MIRALAX / GLYCOLAX) packet Take 17 g by mouth daily. (Patient taking differently: Take 17 g by mouth daily as needed for mild constipation.)     Allergies  Allergen Reactions  . Baclofen     Severe delirium when given to pateint in Dec 2018.   . Chlorhexidine Rash    Social History   Socioeconomic History  .  Marital status: Divorced    Spouse name: Not on file  . Number of children: Not on file  . Years of education: Not on file  . Highest education level: Not on file  Occupational History  . Not on file  Tobacco Use  . Smoking status: Former Smoker    Packs/day: 0.25    Years: 30.00    Pack years: 7.50    Types: Cigarettes  . Smokeless tobacco: Never Used  . Tobacco comment: quit late 1990's  Vaping Use  . Vaping Use: Never used  Substance and Sexual Activity  . Alcohol use: No  . Drug use: No  . Sexual activity: Never  Other Topics Concern  . Not on file  Social History Narrative  . Not on file   Social  Determinants of Health   Financial Resource Strain: Not on file  Food Insecurity: Not on file  Transportation Needs: Not on file  Physical Activity: Not on file  Stress: Not on file  Social Connections: Not on file  Intimate Partner Violence: Not on file     Review of Systems: General: negative for chills, fever, night sweats or weight changes.  Cardiovascular: negative for chest pain, dyspnea on exertion, edema, orthopnea, palpitations, paroxysmal nocturnal dyspnea or shortness of breath Dermatological: negative for rash Respiratory: negative for cough or wheezing Urologic: negative for hematuria Abdominal: negative for nausea, vomiting, diarrhea, bright red blood per rectum, melena, or hematemesis Neurologic: negative for visual changes, syncope, or dizziness All other systems reviewed and are otherwise negative except as noted above.    Blood pressure 128/64, pulse 80, height 5\' 3"  (1.6 m), weight 187 lb (84.8 kg).  General appearance: alert and no distress Neck: no adenopathy, no JVD, supple, symmetrical, trachea midline, thyroid not enlarged, symmetric, no tenderness/mass/nodules and Bilateral carotid bruits Lungs: clear to auscultation bilaterally Heart: regular rate and rhythm, S1, S2 normal, no murmur, click, rub or gallop Extremities: extremities normal, atraumatic, no cyanosis or edema Pulses: 2+ and symmetric Diminished pedal pulses Skin: Skin color, texture, turgor normal. No rashes or lesions or Reddish discoloration of her right pretibial area and foot Neurologic: Alert and oriented X 3, normal strength and tone. Normal symmetric reflexes. Normal coordination and gait  EKG sinus rhythm at 80 with evidence of LVH and lateral T wave inversion.  I personally reviewed this EKG.  ASSESSMENT AND PLAN:   Peripheral arterial disease (Kathleen [name]) Kathleen [name] returns today for follow-up of her PAD.  I last saw her in the office 02/23/2016 as referral from Kathleen [name] at the wound  care center.  She had pretibial ulcerations at that time with serum creatinines in the mid 3 range which ultimately progressed to needing hemodialysis which started 3 years ago.  She is minimally or nonambulatory and basically spends a day in a reclining chair.  She denies chest pain or shortness of breath.  She did have recent Doppler studies performed in our office as a 09/30/2020 revealed a right ABI of 0.75 and a left of 0.55 with high-frequency signals in the right common femoral artery and distal right SFA.  On inspection of her right lower extremity she does have discoloration of her skin but no open wounds either on her pretibial area or or on her heels.      Kathleen Harp MD [place], [facility] 10/09/2020 11:44 AM

## 2020-10-09 NOTE — Progress Notes (Addendum)
Kathleen, [name] ([id number]) Visit Report for 10/09/2020 Chief Complaint Document Details Patient Name: Date of Service: Kathleen [name] RES M. 10/09/2020 1:15 PM Medical Record Number: [id number] Patient Account Number: [id number] Date of Birth/Sex: Treating RN: 1946/07/10 (75 y.o. Elam Dutch Primary Care Provider: Benito Mccreedy Other Clinician: Referring Provider: Treating Provider/Extender: Ivory Broad in Treatment: 3 Information Obtained from: Patient Chief Complaint patient is here for evaluation of her right lower extremity, anterior aspect Electronic Signature(s) Signed: 10/09/2020 1:25:57 PM By: Worthy Keeler PA-C Entered By: Worthy Keeler on 10/09/2020 13:25:57 -------------------------------------------------------------------------------- HPI Details Patient Name: Date of Service: Kathleen [name], DELO RES M. 10/09/2020 1:15 PM Medical Record Number: [id number] Patient Account Number: [id number] Date of Birth/Sex: Treating RN: 12-Sep-1945 (75 y.o. Elam Dutch Primary Care Provider: Benito Mccreedy Other Clinician: Referring Provider: Treating Provider/Extender: Ivory Broad in Treatment: 3 History of Present Illness HPI Description: 05/14/15; this is a 75 year old woman with a history of diabetes who was referred here I think after being seen in the emergency room on 9/29 with sudden edema of the right leg and a large resultant blister on the posterior medial part of the right leg. She had a previous blister about a week before her arrival there however this it open by the time she came to the emergency room. She has no history of known PAD, prior wound. She does have a history of a cardiomyopathy with her last echo on 10/15 showing an EF of 50-55%. She was given clindamycin orally and she is about to complete these in 2 days. She was felt to have cellulitis of the right lower extremity. 05/22/15; her duplex  ultrasound was negative for DVT The Kerlix Coban appears to be controlling her edema. The substantial wound area on the right posterior . medial leg looks to be improved with epithelialization. She still has a reasonably substantial open area however I think this is going to heal there is no evidence of infection. Her peripheral pulses are not palpable in this foot. I think this lady probably has some degree of PAD as well. interestingly, she comes in with a crop of blisters just below her knee. These are itchy and if they have been more dermatomal I might of considered zoster. However I think this is probably some form of contact dermatitis question Coban oo 05/28/15. The area on the medial aspect of her right leg continues to be improved in terms of advancing epithelialization. There is no evidence of infection. She probably has some degree of PAD. The contact dermatitis last blisters in the top of her right leg from last week are resolved. Finally she on the left leg she has a large blister which I opened this was flaccid she has edema in the left leg and I think this is the cause 06/05/15. Her right leg wound is completely epithelialized. There is no open wounds on her right leg. I had to remove the remaining skin from the blister on her left leg however this looks superficial and should be resolved next week. It also looks like she has some friction injury on the top of her right anterior first metatarsal head. I've advised to protect this in her shoes 06/11/15; she has now to open areas one posteriorly on the left leg and one anteriorly on the left. Still poorly controlled edema and may be developing a small additional blister on the right leg. None of this looks to have infection.she is  obtained juxtalite stockings at home but she did not bring them today 06/19/15; patient returns today all of her wounds are closed. She has significant venous insufficiency with inflammation right greater than  left considerable hemosiderin deposition on the right. We are able to discharge her in her juxtalite stockings. I gave her instructions for lubricating the skin her lower legs twice a day READMISSION; 02/05/16; this is a patient we had in this clinic from 10/7 through 11/4 2016. At that point I felt she had coexistent venous insufficiency and edema and PAD. She presented with a blister and cellulitis on the medial right leg which ultimately form the wound which progressively epithelialized over several weeks and then resolved. During her stay here she does not seem to pad noninvasive arterial studies. She also developed large flaccid blisters on her left leg that formed wounds as well. We ultimately discharged. Juxtalite stockings to control the swelling. The patient is a type II diabetic on insulin. He would appear that she has fairly significant chronic renal failure with the last creatinine on 4/22,017 at 3.36. Estimated GFR of 13. Last hemoglobin A1c of 9.0 in April 2017 the patient was seen by her primary physician Dr. Iona Beard [name]. He noted what sounds like cellulitis on the posterior left leg and a clean 6 cm x 5 cm ulcer with surrounding skin redness. She was put on antibiotics. The patient states she has not had any wounds on her legs since she was here in the fall. I'm not exactly sure how she treated the ulcer in her primary physician's office however she arrived with no ulcer on the left leg only eschar that peeled off to reveal normal skin. It is fairly clear that the patient is not wearing her juxtalite stockings. She is not describing shortness breath or chest pain but does describe heaviness in her legs when she walks forcing her to stop and rest. In this clinic her ABI on the right was 0.65 and on the left 0.54. 02/19/16; the patient arrived with a small hemorrhagic blister on the left anterior leg. There is nothing on the right. She's been to see Dr. Gwenlyn Found the formal  studies showed an ABI in the right of 0.64 on the left of 0.59. TBI in the right 0.48 0.49 on the left. She had bilateral common femoral artery waves were triphasic. Bilateral popliteal peroneal anterior and posterior tibial arteries were monophasic. She has a follow-up with Dr. Gwenlyn Found next week 02/26/16; left leg blisters healed however she has a right leg blister this was excised with pickups and scissors. She's been to see Dr. Gwenlyn Found who notes her chronic renal failure, the reasonably unimpressive nature of her wounds and did not want to proceed with angiography. He will see her in 3 months 03/04/16; left leg blisters have healed however she did not come in with her juxtalite stockings. She has been to see Dr. Gwenlyn Found did not want to proceed with angiography. Follow-up in 3 months. She developed a blister on the right last week we excised it. Dimensions are better 09/29/16- the patient made an appointment for a large blister on the anterior aspect of her right lower extremity since making the appointment the blister deroofed and has completely healed and epithelialized. She has not been using her juxta lites that were prescribed nor has she been wearing compression stockings. READMISSION 09/14/2020 This is a patient that we had in the clinic in 2016 and 17. She was last here in February 2018. These were largely  for what was felt to be chronic venous insufficiency wounds on her bilateral lower extremities. At one point she had bilateral juxta lite stockings. She was also noted to have diabetic angiopathy with PAD. She went to see Dr. Gwenlyn Found but because the wounds were predominantly venous and she was in stage IV chronic renal failure nothing further was done about her PAD. She comes back in clinic today with wounds on her right heel, right first toe, left first toe and 3 wounds on the left anterior lower extremity. She is complaining of a lot of pain in the left foot, right heel. She is not a smoker. Past  medical history includes coronary artery disease with ischemic cardiomyopathy, type 2 diabetes, coronary artery disease, osteoarthritis, hypertension, chronic renal failure now on dialysis, hyperlipidemia, asthma ABIs in our clinic today were 0.5 on the right and 0.55 on the left 2/14; we do not have an appointment for vascular studies and we continue to work on this. She has areas on her left anterior lower leg which look mostly like venous wounds. She has areas on the right first toe tips right fourth toe tip and right heel. She is complaining a lot of pain however it is difficult to really determine how much of the pain she experiences is secondary to claudication. We wrapped both legs last time however it looks like she has wrap injury on the upper medial right leg with blistering and excoriations. 2/21; arterial studies are on the 23rd with Dr. Kennon Holter office. The patient's areas on the left anterior lower leg which are mostly venous wounds are closed. On the right the tip of her first toe and fifth toe have eschar I did not remove this. There is still an open area on the right heel were using silver alginate 10/09/2020 upon evaluation today patient appears to be doing well with regard to her ulcerations. This is on the right heel and left great toe. With that being said she unfortunately does not have good blood flow in either extremity. In fact I did review her Doppler study and it appears that on the right she had an ABI of 0.75 which was dampened and a TBI of 0.16. On the left she had an ABI of 0.55 and absent signals in the toes we could not even get a TBI. Obviously this is even a bit worse than her previous evaluation. This indicates at least moderate right lower extremity arterial disease and moderate left lower extremity arterial disease. With that being said interestingly and in a good way the patient is actually showing some signs of improvement nonetheless with regard to her wounds.  Fortunately there is no evidence of active infection at this time which is great news. No fevers, chills, nausea, vomiting, or diarrhea. Electronic Signature(s) Signed: 10/09/2020 2:40:21 PM By: Worthy Keeler PA-C Entered By: Worthy Keeler on 10/09/2020 14:40:21 -------------------------------------------------------------------------------- Physical Exam Details Patient Name: Date of Service: Kathleen [name] RES M. 10/09/2020 1:15 PM Medical Record Number: [id number] Patient Account Number: [id number] Date of Birth/Sex: Treating RN: [birth year] (75 y.o. Elam Dutch Primary Care Provider: Benito Mccreedy Other Clinician: Referring Provider: Treating Provider/Extender: Ivory Broad in Treatment: 3 Constitutional Well-nourished and well-hydrated in no acute distress. Respiratory normal breathing without difficulty. Psychiatric this patient is able to make decisions and demonstrates good insight into disease process. Alert and Oriented x 3. pleasant and cooperative. Notes Upon inspection patient's wound bed actually showed signs of good granulation at this  time at both wound locations and in fact the great toe may actually be completely healed. The heel looks to be very small and doing better as well. That is on the right. Nonetheless based on the fact that she has poor ABIs and the fact that she is actually doing quite well I am really not going to make any major changes in her treatment regimen at this point and I am not going to send her back to Dr. Alvester Chou for evaluation of an arteriogram as to be honest I think it would be best just to let these heal if we get them to heal. Electronic Signature(s) Signed: 10/09/2020 2:40:59 PM By: Worthy Keeler PA-C Signed: 10/09/2020 2:40:59 PM By: Worthy Keeler PA-C Entered By: Worthy Keeler on 10/09/2020 14:40:59 -------------------------------------------------------------------------------- Physician Orders  Details Patient Name: Date of Service: Kathleen [name] RES M. 10/09/2020 1:15 PM Medical Record Number: [id number] Patient Account Number: [id number] Date of Birth/Sex: Treating RN: 1946-06-08 (75 y.o. Elam Dutch Primary Care Provider: Benito Mccreedy Other Clinician: Referring Provider: Treating Provider/Extender: Ivory Broad in Treatment: 3 Verbal / Phone Orders: No Diagnosis Coding ICD-10 Coding Code Description E11.51 Type 2 diabetes mellitus with diabetic peripheral angiopathy without gangrene I87.323 Chronic venous hypertension (idiopathic) with inflammation of bilateral lower extremity L97.411 Non-pressure chronic ulcer of right heel and midfoot limited to breakdown of skin L97.521 Non-pressure chronic ulcer of other part of left foot limited to breakdown of skin L97.822 Non-pressure chronic ulcer of other part of left lower leg with fat layer exposed E11.42 Type 2 diabetes mellitus with diabetic polyneuropathy Follow-up Appointments Return Appointment in 2 weeks. Bathing/ Shower/ Hygiene May shower with protection but do not get wound dressing(s) wet. May shower and wash wound with soap and water. - with dressing changes Edema Control - Lymphedema / SCD / Other Elevate legs to the level of the heart or above for 30 minutes daily and/or when sitting, a frequency of: - throughout the day Avoid standing for long periods of time. Exercise regularly Moisturize legs daily. - both legs and feet daily Wound Treatment Wound #10 - T Great oe Wound Laterality: Left Cleanser: Normal Saline Every Other Day/7 Days Discharge Instructions: Cleanse the wound with Normal Saline or soap and water prior to applying a clean dressing using gauze sponges, not tissue or cotton balls. Prim Dressing: KerraCel Ag Gelling Fiber Dressing, 2x2 in (silver alginate) Every Other Day/7 Days ary Discharge Instructions: Apply silver alginate to wound bed as  instructed Secondary Dressing: Woven Gauze Sponges 2x2 in Every Other Day/7 Days Discharge Instructions: Apply over primary dressing as directed. Secured With: Child psychotherapist, Sterile 2x75 (in/in) Every Other Day/7 Days Discharge Instructions: Secure with stretch gauze as directed. Secured With: Paper Tape, 1x10 (in/yd) Every Other Day/7 Days Discharge Instructions: Secure dressing with tape as directed. Wound #9 - Calcaneus Wound Laterality: Right Cleanser: Soap and Water Every Other Day/7 Days Discharge Instructions: May shower and wash wound with dial antibacterial soap and water prior to dressing change. Prim Dressing: KerraCel Ag Gelling Fiber Dressing, 2x2 in (silver alginate) (Generic) Every Other Day/7 Days ary Discharge Instructions: Apply silver alginate to wound bed as instructed Secondary Dressing: Woven Gauze Sponge, Non-Sterile 4x4 in (Generic) Every Other Day/7 Days Discharge Instructions: Apply over primary dressing as directed. Secondary Dressing: ALLEVYN Heel 4 1/2in x 5 1/2in / 10.5cm x 13.5cm Every Other Day/7 Days Discharge Instructions: Apply over primary dressing as directed. Secured With: [employer]  Sterile, 4.5x3.1 (in/yd) (Generic) Every Other Day/7 Days Discharge Instructions: Secure with Kerlix as directed. Secured With: Paper Tape, 2x10 (in/yd) (Generic) Every Other Day/7 Days Discharge Instructions: Secure dressing with tape as directed. Electronic Signature(s) Signed: 10/09/2020 4:38:50 PM By: Worthy Keeler PA-C Signed: 10/09/2020 5:05:02 PM By: Baruch Gouty RN, BSN Entered By: Baruch Gouty on 10/09/2020 14:39:20 -------------------------------------------------------------------------------- Problem List Details Patient Name: Date of Service: Kathleen [name], DELO RES M. 10/09/2020 1:15 PM Medical Record Number: [id number] Patient Account Number: [id number] Date of Birth/Sex: Treating RN: 1946-03-23 (75 y.o. Elam Dutch Primary Care  Provider: Benito Mccreedy Other Clinician: Referring Provider: Treating Provider/Extender: Ivory Broad in Treatment: 3 Active Problems ICD-10 Encounter Code Description Active Date MDM Diagnosis E11.51 Type 2 diabetes mellitus with diabetic peripheral angiopathy without gangrene 09/14/2020 No Yes I87.323 Chronic venous hypertension (idiopathic) with inflammation of bilateral lower 09/14/2020 No Yes extremity L97.411 Non-pressure chronic ulcer of right heel and midfoot limited to breakdown of 09/14/2020 No Yes skin L97.521 Non-pressure chronic ulcer of other part of left foot limited to breakdown of 09/14/2020 No Yes skin L97.822 Non-pressure chronic ulcer of other part of left lower leg with fat layer exposed2/02/2021 No Yes E11.42 Type 2 diabetes mellitus with diabetic polyneuropathy 09/14/2020 No Yes Inactive Problems Resolved Problems Electronic Signature(s) Signed: 10/09/2020 1:25:52 PM By: Worthy Keeler PA-C Entered By: Worthy Keeler on 10/09/2020 13:25:51 -------------------------------------------------------------------------------- Progress Note Details Patient Name: Date of Service: Kathleen [name] RES M. 10/09/2020 1:15 PM Medical Record Number: [id number] Patient Account Number: [id number] Date of Birth/Sex: Treating RN: [birth year] (75 y.o. Martyn Malay, Linda Primary Care Provider: Benito Mccreedy Other Clinician: Referring Provider: Treating Provider/Extender: Ivory Broad in Treatment: 3 Subjective Chief Complaint Information obtained from Patient patient is here for evaluation of her right lower extremity, anterior aspect History of Present Illness (HPI) 05/14/15; this is a 75 year old woman with a history of diabetes who was referred here I think after being seen in the emergency room on 9/29 with sudden edema of the right leg and a large resultant blister on the posterior medial part of the right leg. She had a  previous blister about a week before her arrival there however this it open by the time she came to the emergency room. She has no history of known PAD, prior wound. She does have a history of a cardiomyopathy with her last echo on 10/15 showing an EF of 50-55%. She was given clindamycin orally and she is about to complete these in 2 days. She was felt to have cellulitis of the right lower extremity. 05/22/15; her duplex ultrasound was negative for DVT The Kerlix Coban appears to be controlling her edema. The substantial wound area on the right posterior . medial leg looks to be improved with epithelialization. She still has a reasonably substantial open area however I think this is going to heal there is no evidence of infection. Her peripheral pulses are not palpable in this foot. I think this lady probably has some degree of PAD as well. interestingly, she comes in with a crop of blisters just below her knee. These are itchy and if they have been more dermatomal I might of considered zoster. However I think this is probably some form of contact dermatitis question Coban oo 05/28/15. The area on the medial aspect of her right leg continues to be improved in terms of advancing epithelialization. There is no evidence of infection. She probably has some degree of  PAD. The contact dermatitis last blisters in the top of her right leg from last week are resolved. Finally she on the left leg she has a large blister which I opened this was flaccid she has edema in the left leg and I think this is the cause 06/05/15. Her right leg wound is completely epithelialized. There is no open wounds on her right leg. I had to remove the remaining skin from the blister on her left leg however this looks superficial and should be resolved next week. It also looks like she has some friction injury on the top of her right anterior first metatarsal head. I've advised to protect this in her shoes 06/11/15; she has now to  open areas one posteriorly on the left leg and one anteriorly on the left. Still poorly controlled edema and may be developing a small additional blister on the right leg. None of this looks to have infection.she is obtained juxtalite stockings at home but she did not bring them today 06/19/15; patient returns today all of her wounds are closed. She has significant venous insufficiency with inflammation right greater than left considerable hemosiderin deposition on the right. We are able to discharge her in her juxtalite stockings. I gave her instructions for lubricating the skin her lower legs twice a day READMISSION; 02/05/16; this is a patient we had in this clinic from 10/7 through 11/4 2016. At that point I felt she had coexistent venous insufficiency and edema and PAD. She presented with a blister and cellulitis on the medial right leg which ultimately form the wound which progressively epithelialized over several weeks and then resolved. During her stay here she does not seem to pad noninvasive arterial studies. She also developed large flaccid blisters on her left leg that formed wounds as well. We ultimately discharged. Juxtalite stockings to control the swelling. The patient is a type II diabetic on insulin. He would appear that she has fairly significant chronic renal failure with the last creatinine on 4/22,017 at 3.36. Estimated GFR of 13. Last hemoglobin A1c of 9.0 in April 2017 the patient was seen by her primary physician Dr. Iona Beard [name]. He noted what sounds like cellulitis on the posterior left leg and a clean 6 cm x 5 cm ulcer with surrounding skin redness. She was put on antibiotics. The patient states she has not had any wounds on her legs since she was here in the fall. I'm not exactly sure how she treated the ulcer in her primary physician's office however she arrived with no ulcer on the left leg only eschar that peeled off to reveal normal skin. It is fairly clear that the  patient is not wearing her juxtalite stockings. She is not describing shortness breath or chest pain but does describe heaviness in her legs when she walks forcing her to stop and rest. In this clinic her ABI on the right was 0.65 and on the left 0.54. 02/19/16; the patient arrived with a small hemorrhagic blister on the left anterior leg. There is nothing on the right. She's been to see Dr. Gwenlyn Found the formal studies showed an ABI in the right of 0.64 on the left of 0.59. TBI in the right 0.48 0.49 on the left. She had bilateral common femoral artery waves were triphasic. Bilateral popliteal peroneal anterior and posterior tibial arteries were monophasic. She has a follow-up with Dr. Gwenlyn Found next week 02/26/16; left leg blisters healed however she has a right leg blister this was excised with pickups and  scissors. She's been to see Dr. Gwenlyn Found who notes her chronic renal failure, the reasonably unimpressive nature of her wounds and did not want to proceed with angiography. He will see her in 3 months 03/04/16; left leg blisters have healed however she did not come in with her juxtalite stockings. She has been to see Dr. Gwenlyn Found did not want to proceed with angiography. Follow-up in 3 months. She developed a blister on the right last week we excised it. Dimensions are better 09/29/16- the patient made an appointment for a large blister on the anterior aspect of her right lower extremity since making the appointment the blister deroofed and has completely healed and epithelialized. She has not been using her juxta lites that were prescribed nor has she been wearing compression stockings. READMISSION 09/14/2020 This is a patient that we had in the clinic in 2016 and 17. She was last here in February 2018. These were largely for what was felt to be chronic venous insufficiency wounds on her bilateral lower extremities. At one point she had bilateral juxta lite stockings. She was also noted to have diabetic angiopathy  with PAD. She went to see Dr. Gwenlyn Found but because the wounds were predominantly venous and she was in stage IV chronic renal failure nothing further was done about her PAD. She comes back in clinic today with wounds on her right heel, right first toe, left first toe and 3 wounds on the left anterior lower extremity. She is complaining of a lot of pain in the left foot, right heel. She is not a smoker. Past medical history includes coronary artery disease with ischemic cardiomyopathy, type 2 diabetes, coronary artery disease, osteoarthritis, hypertension, chronic renal failure now on dialysis, hyperlipidemia, asthma ABIs in our clinic today were 0.5 on the right and 0.55 on the left 2/14; we do not have an appointment for vascular studies and we continue to work on this. She has areas on her left anterior lower leg which look mostly like venous wounds. She has areas on the right first toe tips right fourth toe tip and right heel. She is complaining a lot of pain however it is difficult to really determine how much of the pain she experiences is secondary to claudication. We wrapped both legs last time however it looks like she has wrap injury on the upper medial right leg with blistering and excoriations. 2/21; arterial studies are on the 23rd with Dr. Kennon Holter office. The patient's areas on the left anterior lower leg which are mostly venous wounds are closed. On the right the tip of her first toe and fifth toe have eschar I did not remove this. There is still an open area on the right heel were using silver alginate 10/09/2020 upon evaluation today patient appears to be doing well with regard to her ulcerations. This is on the right heel and left great toe. With that being said she unfortunately does not have good blood flow in either extremity. In fact I did review her Doppler study and it appears that on the right she had an ABI of 0.75 which was dampened and a TBI of 0.16. On the left she had an ABI of  0.55 and absent signals in the toes we could not even get a TBI. Obviously this is even a bit worse than her previous evaluation. This indicates at least moderate right lower extremity arterial disease and moderate left lower extremity arterial disease. With that being said interestingly and in a good way the patient  is actually showing some signs of improvement nonetheless with regard to her wounds. Fortunately there is no evidence of active infection at this time which is great news. No fevers, chills, nausea, vomiting, or diarrhea. Objective Constitutional Well-nourished and well-hydrated in no acute distress. Vitals Time Taken: 1:41 PM, Height: 63 in, Weight: 187 lbs, BMI: 33.1, Temperature: 97.7 F, Pulse: 89 bpm, Respiratory Rate: 17 breaths/min, Blood Pressure: 148/82 mmHg. Respiratory normal breathing without difficulty. Psychiatric this patient is able to make decisions and demonstrates good insight into disease process. Alert and Oriented x 3. pleasant and cooperative. General Notes: Upon inspection patient's wound bed actually showed signs of good granulation at this time at both wound locations and in fact the great toe may actually be completely healed. The heel looks to be very small and doing better as well. That is on the right. Nonetheless based on the fact that she has poor ABIs and the fact that she is actually doing quite well I am really not going to make any major changes in her treatment regimen at this point and I am not going to send her back to Dr. Alvester Chou for evaluation of an arteriogram as to be honest I think it would be best just to let these heal if we get them to heal. Integumentary (Hair, Skin) Wound #10 status is Open. Original cause of wound was Blister. The date acquired was: 07/14/2020. The wound has been in treatment 3 weeks. The wound is located on the Left T Great. The wound measures 0.1cm length x 0.1cm width x 0.1cm depth; 0.008cm^2 area and 0.001cm^3  volume. There is Fat Layer oe (Subcutaneous Tissue) exposed. There is no tunneling or undermining noted. There is a medium amount of serosanguineous drainage noted. The wound margin is distinct with the outline attached to the wound base. There is large (67-100%) red granulation within the wound bed. There is a small (1-33%) amount of necrotic tissue within the wound bed including Adherent Slough. Wound #11 status is Open. Original cause of wound was Blister. The date acquired was: 07/14/2020. The wound has been in treatment 3 weeks. The wound is located on the Left,Medial Lower Leg. The wound measures 0cm length x 0cm width x 0cm depth; 0cm^2 area and 0cm^3 volume. Wound #12 status is Open. Original cause of wound was Blister. The date acquired was: 07/14/2020. The wound has been in treatment 3 weeks. The wound is located on the Left,Anterior Lower Leg. The wound measures 0cm length x 0cm width x 0cm depth; 0cm^2 area and 0cm^3 volume. Wound #9 status is Open. Original cause of wound was Blister. The date acquired was: 07/14/2020. The wound has been in treatment 3 weeks. The wound is located on the Right Calcaneus. The wound measures 0.2cm length x 0.3cm width x 0.1cm depth; 0.047cm^2 area and 0.005cm^3 volume. There is Fat Layer (Subcutaneous Tissue) exposed. There is no tunneling or undermining noted. There is a medium amount of serosanguineous drainage noted. The wound margin is distinct with the outline attached to the wound base. There is medium (34-66%) pink granulation within the wound bed. There is a medium (34-66%) amount of necrotic tissue within the wound bed including Adherent Slough. Assessment Active Problems ICD-10 Type 2 diabetes mellitus with diabetic peripheral angiopathy without gangrene Chronic venous hypertension (idiopathic) with inflammation of bilateral lower extremity Non-pressure chronic ulcer of right heel and midfoot limited to breakdown of skin Non-pressure chronic  ulcer of other part of left foot limited to breakdown of skin Non-pressure chronic  ulcer of other part of left lower leg with fat layer exposed Type 2 diabetes mellitus with diabetic polyneuropathy Plan Follow-up Appointments: Return Appointment in 2 weeks. Bathing/ Shower/ Hygiene: May shower with protection but do not get wound dressing(s) wet. May shower and wash wound with soap and water. - with dressing changes Edema Control - Lymphedema / SCD / Other: Elevate legs to the level of the heart or above for 30 minutes daily and/or when sitting, a frequency of: - throughout the day Avoid standing for long periods of time. Exercise regularly Moisturize legs daily. - both legs and feet daily WOUND #10: - T Great oe Wound Laterality: Left Cleanser: Normal Saline Every Other Day/7 Days Discharge Instructions: Cleanse the wound with Normal Saline or soap and water prior to applying a clean dressing using gauze sponges, not tissue or cotton balls. Prim Dressing: KerraCel Ag Gelling Fiber Dressing, 2x2 in (silver alginate) Every Other Day/7 Days ary Discharge Instructions: Apply silver alginate to wound bed as instructed Secondary Dressing: Woven Gauze Sponges 2x2 in Every Other Day/7 Days Discharge Instructions: Apply over primary dressing as directed. Secured With: Child psychotherapist, Sterile 2x75 (in/in) Every Other Day/7 Days Discharge Instructions: Secure with stretch gauze as directed. Secured With: Paper T ape, 1x10 (in/yd) Every Other Day/7 Days Discharge Instructions: Secure dressing with tape as directed. WOUND #9: - Calcaneus Wound Laterality: Right Cleanser: Soap and Water Every Other Day/7 Days Discharge Instructions: May shower and wash wound with dial antibacterial soap and water prior to dressing change. Prim Dressing: KerraCel Ag Gelling Fiber Dressing, 2x2 in (silver alginate) (Generic) Every Other Day/7 Days ary Discharge Instructions: Apply silver alginate  to wound bed as instructed Secondary Dressing: Woven Gauze Sponge, Non-Sterile 4x4 in (Generic) Every Other Day/7 Days Discharge Instructions: Apply over primary dressing as directed. Secondary Dressing: ALLEVYN Heel 4 1/2in x 5 1/2in / 10.5cm x 13.5cm Every Other Day/7 Days Discharge Instructions: Apply over primary dressing as directed. Secured With: [employer], 4.5x3.1 (in/yd) (Generic) Every Other Day/7 Days Discharge Instructions: Secure with Kerlix as directed. Secured With: Paper T ape, 2x10 (in/yd) (Generic) Every Other Day/7 Days Discharge Instructions: Secure dressing with tape as directed. 1. I would recommend currently that we go ahead and continue with the silver alginate dressing to both wound locations. 2. Also can recommend the patient continue to monitor for any signs of infection her daughter is helping to change the dressings to keep a close eye on things as well. 3. I am also can recommend that we continue to monitor for any signs of worsening if she develops any issues then we will make adjustments as necessary but my hope is she will not have any complications here from the standpoint of nonhealing. If she does develop into a nonhealing situation we will likely need to refer her to Dr. Alvester Chou for possible arterial flow intervention but again I am not certain that is in her best interest right now considering she is doing better. We will see patient back for reevaluation in 2 weeks here in the clinic. If anything worsens or changes patient will contact our office for additional recommendations. Electronic Signature(s) Signed: 10/09/2020 2:42:07 PM By: Worthy Keeler PA-C Entered By: Worthy Keeler on 10/09/2020 14:42:07 -------------------------------------------------------------------------------- SuperBill Details Patient Name: Date of Service: Kathleen [name], DELO RES M. 10/09/2020 Medical Record Number: [id number] Patient Account Number: [id number] Date of Birth/Sex:  Treating RN: 10-Jun-1946 (75 y.o. Elam Dutch Primary Care Provider: Benito Mccreedy Other Clinician:  Referring Provider: Treating Provider/Extender: Ivory Broad in Treatment: 3 Diagnosis Coding ICD-10 Codes Code Description E11.51 Type 2 diabetes mellitus with diabetic peripheral angiopathy without gangrene I87.323 Chronic venous hypertension (idiopathic) with inflammation of bilateral lower extremity L97.411 Non-pressure chronic ulcer of right heel and midfoot limited to breakdown of skin L97.521 Non-pressure chronic ulcer of other part of left foot limited to breakdown of skin L97.822 Non-pressure chronic ulcer of other part of left lower leg with fat layer exposed E11.42 Type 2 diabetes mellitus with diabetic polyneuropathy Facility Procedures CPT4 Code: 49675916 Description: 99214 - WOUND CARE VISIT-LEV 4 EST PT Modifier: Quantity: 1 Physician Procedures : CPT4 Code Description Modifier [id number] - WC PHYS LEVEL 3 - EST PT 1 ICD-10 Diagnosis Description E11.51 Type 2 diabetes mellitus with diabetic peripheral angiopathy without gangrene I87.323 Chronic venous hypertension (idiopathic) with  inflammation of bilateral lower extremity L97.411 Non-pressure chronic ulcer of right heel and midfoot limited to breakdown of skin L97.521 Non-pressure chronic ulcer of other part of left foot limited to breakdown of skin Quantity: Electronic Signature(s) Signed: 10/09/2020 2:43:58 PM By: Worthy Keeler PA-C Entered By: Worthy Keeler on 10/09/2020 14:43:57

## 2020-10-09 NOTE — Assessment & Plan Note (Signed)
Kathleen [name] returns today for follow-up of her PAD.  I last saw her in the office 02/23/2016 as referral from Dr. Dellia Nims at the wound care center.  She had pretibial ulcerations at that time with serum creatinines in the mid 3 range which ultimately progressed to needing hemodialysis which started 3 years ago.  She is minimally or nonambulatory and basically spends a day in a reclining chair.  She denies chest pain or shortness of breath.  She did have recent Doppler studies performed in our office as a 09/30/2020 revealed a right ABI of 0.75 and a left of 0.55 with high-frequency signals in the right common femoral artery and distal right SFA.  On inspection of her right lower extremity she does have discoloration of her skin but no open wounds either on her pretibial area or or on her heels.

## 2020-10-09 NOTE — Patient Instructions (Addendum)
Medication Instructions:  Your physician recommends that you continue on your current medications as directed. Please refer to the Current Medication list given to you today.  *If you need a refill on your cardiac medications before your next appointment, please call your pharmacy*   Testing/Procedures: Your physician has requested that you have a carotid duplex. This test is an ultrasound of the carotid arteries in your neck. It looks at blood flow through these arteries that supply the brain with blood. Allow one hour for this exam. There are no restrictions or special instructions. To be done now.  Your physician has requested that you have a lower extremity arterial duplex. This test is an ultrasound of the arteries in the legs. It looks at arterial blood flow in the legs. Allow one hour for Lower Arterial scans. There are no restrictions or special instructions. To be done in Feb 2023   Your physician has requested that you have an ankle brachial index (ABI). During this test an ultrasound and blood pressure cuff are used to evaluate the arteries that supply the arms and legs with blood. Allow thirty minutes for this exam. There are no restrictions or special instructions. To be done in Feb 2023.  These procedures are done at [place]. 2nd Floor  Follow-Up: At [facility], you and your health needs are our priority.  As part of our continuing mission to provide you with exceptional heart care, we have created designated Provider Care Teams.  These Care Teams include your primary Cardiologist (physician) and Advanced Practice Providers (APPs -  Physician Assistants and Nurse Practitioners) who all work together to provide you with the care you need, when you need it.  We recommend signing up for the patient portal called "MyChart".  Sign up information is provided on this After Visit Summary.  MyChart is used to connect with patients for Virtual Visits (Telemedicine).  Patients  are able to view lab/test results, encounter notes, upcoming appointments, etc.  Non-urgent messages can be sent to your provider as well.   To learn more about what you can do with MyChart, go to [email or web address].    Your next appointment:   No future appointments made today. We will see you on an as needed basis. Please call our office if you need to make an appointment.  Provider:   Quay Burow, MD    Other Instructions Please make a follow up appointment with your cardiologist, Dr. Johnsie Cancel.

## 2020-10-10 DIAGNOSIS — N186 End stage renal disease: Secondary | ICD-10-CM | POA: Diagnosis not present

## 2020-10-10 DIAGNOSIS — Z992 Dependence on renal dialysis: Secondary | ICD-10-CM | POA: Diagnosis not present

## 2020-10-10 DIAGNOSIS — N2581 Secondary hyperparathyroidism of renal origin: Secondary | ICD-10-CM | POA: Diagnosis not present

## 2020-10-13 DIAGNOSIS — N2581 Secondary hyperparathyroidism of renal origin: Secondary | ICD-10-CM | POA: Diagnosis not present

## 2020-10-13 DIAGNOSIS — Z992 Dependence on renal dialysis: Secondary | ICD-10-CM | POA: Diagnosis not present

## 2020-10-13 DIAGNOSIS — N186 End stage renal disease: Secondary | ICD-10-CM | POA: Diagnosis not present

## 2020-10-13 NOTE — Progress Notes (Signed)
Kathleen [name], Kathleen [name] ([id number]) Visit Report for 10/09/2020 Arrival Information Details Patient Name: Date of Service: Kathleen Lah M. 10/09/2020 1:15 PM Medical Record Number: [id number] Patient Account Number: [id number] Date of Birth/Sex: Treating RN: Dec 13, 1945 (75 y.o. Martyn Malay, Linda Primary Care Amear Strojny: Benito Mccreedy Other Clinician: Referring Jazz Biddy: Treating Sherral Dirocco/Extender: Ivory Broad in Treatment: 3 Visit Information History Since Last Visit Added or deleted any medications: No Patient Arrived: Wheel Chair Any new allergies or adverse reactions: No Arrival Time: 13:39 Had a fall or experienced change in No Accompanied By: self activities of daily living that may affect Transfer Assistance: None risk of falls: Patient Identification Verified: Yes Signs or symptoms of abuse/neglect since last visito No Secondary Verification Process Completed: Yes Hospitalized since last visit: No Patient Requires Transmission-Based Precautions: No Implantable device outside of the clinic excluding No Patient Has Alerts: No cellular tissue based products placed in the center since last visit: Has Dressing in Place as Prescribed: Yes Pain Present Now: No Electronic Signature(s) Signed: 10/13/2020 11:11:03 AM By: Sandre Kitty Entered By: Sandre Kitty on 10/09/2020 13:39:59 -------------------------------------------------------------------------------- Clinic Level of Care Assessment Details Patient Name: Date of Service: Kathleen Lah M. 10/09/2020 1:15 PM Medical Record Number: [id number] Patient Account Number: [id number] Date of Birth/Sex: Treating RN: 12-01-1945 (75 y.o. Elam Dutch Primary Care Alayha Babineaux: Benito Mccreedy Other Clinician: Referring Kazandra Forstrom: Treating Palmina Clodfelter/Extender: Ivory Broad in Treatment: 3 Clinic Level of Care Assessment Items TOOL 4 Quantity Score []  - 0 Use when  only an EandM is performed on FOLLOW-UP visit ASSESSMENTS - Nursing Assessment / Reassessment X- 1 10 Reassessment of Co-morbidities (includes updates in patient status) X- 1 5 Reassessment of Adherence to Treatment Plan ASSESSMENTS - Wound and Skin A ssessment / Reassessment []  - 0 Simple Wound Assessment / Reassessment - one wound X- 4 5 Complex Wound Assessment / Reassessment - multiple wounds []  - 0 Dermatologic / Skin Assessment (not related to wound area) ASSESSMENTS - Focused Assessment X- 1 5 Circumferential Edema Measurements - multi extremities []  - 0 Nutritional Assessment / Counseling / Intervention X- 1 5 Lower Extremity Assessment (monofilament, tuning fork, pulses) []  - 0 Peripheral Arterial Disease Assessment (using hand held doppler) ASSESSMENTS - Ostomy and/or Continence Assessment and Care []  - 0 Incontinence Assessment and Management []  - 0 Ostomy Care Assessment and Management (repouching, etc.) PROCESS - Coordination of Care X - Simple Patient / Family Education for ongoing care 1 15 []  - 0 Complex (extensive) Patient / Family Education for ongoing care X- 1 10 Staff obtains Programmer, systems, Records, T Results / Process Orders est []  - 0 Staff telephones HHA, Nursing Homes / Clarify orders / etc []  - 0 Routine Transfer to another Facility (non-emergent condition) []  - 0 Routine Hospital Admission (non-emergent condition) []  - 0 New Admissions / Biomedical engineer / Ordering NPWT Apligraf, etc. , []  - 0 Emergency Hospital Admission (emergent condition) X- 1 10 Simple Discharge Coordination []  - 0 Complex (extensive) Discharge Coordination PROCESS - Special Needs []  - 0 Pediatric / Minor Patient Management []  - 0 Isolation Patient Management []  - 0 Hearing / Language / Visual special needs []  - 0 Assessment of Community assistance (transportation, D/C planning, etc.) []  - 0 Additional assistance / Altered mentation []  - 0 Support  Surface(s) Assessment (bed, cushion, seat, etc.) INTERVENTIONS - Wound Cleansing / Measurement []  - 0 Simple Wound Cleansing - one wound X- 4 5 Complex Wound Cleansing - multiple  wounds X- 1 5 Wound Imaging (photographs - any number of wounds) []  - 0 Wound Tracing (instead of photographs) []  - 0 Simple Wound Measurement - one wound X- 2 5 Complex Wound Measurement - multiple wounds INTERVENTIONS - Wound Dressings X - Small Wound Dressing one or multiple wounds 2 10 []  - 0 Medium Wound Dressing one or multiple wounds []  - 0 Large Wound Dressing one or multiple wounds X- 1 5 Application of Medications - topical []  - 0 Application of Medications - injection INTERVENTIONS - Miscellaneous []  - 0 External ear exam []  - 0 Specimen Collection (cultures, biopsies, blood, body fluids, etc.) []  - 0 Specimen(s) / Culture(s) sent or taken to Lab for analysis []  - 0 Patient Transfer (multiple staff / Civil Service fast streamer / Similar devices) []  - 0 Simple Staple / Suture removal (25 or less) []  - 0 Complex Staple / Suture removal (26 or more) []  - 0 Hypo / Hyperglycemic Management (close monitor of Blood Glucose) []  - 0 Ankle / Brachial Index (ABI) - do not check if billed separately X- 1 5 Vital Signs Has the patient been seen at the hospital within the last three years: Yes Total Score: 145 Level Of Care: New/Established - Level 4 Electronic Signature(s) Signed: 10/09/2020 5:05:02 PM By: Baruch Gouty RN, BSN Entered By: Baruch Gouty on 10/09/2020 14:41:05 -------------------------------------------------------------------------------- Encounter Discharge Information Details Patient Name: Date of Service: Kathleen Carbon RES M. 10/09/2020 1:15 PM Medical Record Number: [id number] Patient Account Number: [id number] Date of Birth/Sex: Treating RN: 01/16/1946 (75 y.o. [name] Primary Care Jevin Camino: Benito Mccreedy Other Clinician: Referring Vansh Reckart: Treating Niylah Hassan/Extender:  Ivory Broad in Treatment: 3 Encounter Discharge Information Items Discharge Condition: Stable Ambulatory Status: Wheelchair Discharge Destination: Home Transportation: Private Auto Accompanied By: self Schedule Follow-up Appointment: Yes Clinical Summary of Care: Electronic Signature(s) Signed: 10/09/2020 5:05:25 PM By: Deon Pilling Entered By: Deon Pilling on 10/09/2020 14:59:34 -------------------------------------------------------------------------------- Lower Extremity Assessment Details Patient Name: Date of Service: Kathleen Carbon RES M. 10/09/2020 1:15 PM Medical Record Number: [id number] Patient Account Number: [id number] Date of Birth/Sex: Treating RN: [birth year] (75 y.o. Tonita Phoenix, Lauren Primary Care Tyreanna Bisesi: Benito Mccreedy Other Clinician: Referring Sadira Standard: Treating Ezrie Bunyan/Extender: Ivory Broad in Treatment: 3 Edema Assessment Assessed: Shirlyn Goltz: Yes] Patrice Paradise: Yes] Edema: [Left: No] [Right: No] Calf Left: Right: Point of Measurement: 28 cm From Medial Instep 33 cm 36 cm Ankle Left: Right: Point of Measurement: 9 cm From Medial Instep 19.5 cm 21 cm Vascular Assessment Pulses: Dorsalis Pedis Palpable: [Left:Yes] [Right:Yes] Posterior Tibial Palpable: [Left:Yes] [Right:Yes] Electronic Signature(s) Signed: 10/12/2020 5:34:21 PM By: Rhae Hammock RN Entered By: Rhae Hammock on 10/09/2020 14:03:55 -------------------------------------------------------------------------------- Multi-Disciplinary Care Plan Details Patient Name: Date of Service: Kathleen Carbon RES M. 10/09/2020 1:15 PM Medical Record Number: [id number] Patient Account Number: [id number] Date of Birth/Sex: Treating RN: March 04, 1946 (75 y.o. Elam Dutch Primary Care Hooper Petteway: Benito Mccreedy Other Clinician: Referring Sharise Lippy: Treating Dail Meece/Extender: Ivory Broad in Treatment:  3 Multidisciplinary Care Plan reviewed with physician Active Inactive Abuse / Safety / Falls / Self Care Management Nursing Diagnoses: Potential for falls Potential for injury related to falls Goals: Patient will not experience any injury related to falls Date Initiated: 09/14/2020 Target Resolution Date: 10/16/2020 Goal Status: Active Patient/caregiver will verbalize/demonstrate measures taken to prevent injury and/or falls Date Initiated: 09/14/2020 Target Resolution Date: 10/16/2020 Goal Status: Active Interventions: Assess Activities of Daily Living upon admission and as needed Assess fall  risk on admission and as needed Assess: immobility, friction, shearing, incontinence upon admission and as needed Assess impairment of mobility on admission and as needed per policy Assess personal safety and home safety (as indicated) on admission and as needed Assess self care needs on admission and as needed Provide education on fall prevention Provide education on personal and home safety Notes: Nutrition Nursing Diagnoses: Impaired glucose control: actual or potential Potential for alteratiion in Nutrition/Potential for imbalanced nutrition Goals: Patient/caregiver agrees to and verbalizes understanding of need to use nutritional supplements and/or vitamins as prescribed Date Initiated: 09/14/2020 Target Resolution Date: 10/16/2020 Goal Status: Active Patient/caregiver will maintain therapeutic glucose control Date Initiated: 09/14/2020 Target Resolution Date: 10/16/2020 Goal Status: Active Interventions: Assess HgA1c results as ordered upon admission and as needed Assess patient nutrition upon admission and as needed per policy Provide education on elevated blood sugars and impact on wound healing Provide education on nutrition Treatment Activities: Education provided on Nutrition : 09/14/2020 Notes: Venous Leg Ulcer Nursing Diagnoses: Knowledge deficit related to disease process and  management Goals: Patient/caregiver will verbalize understanding of disease process and disease management Date Initiated: 09/14/2020 Target Resolution Date: 10/16/2020 Goal Status: Active Interventions: Assess peripheral edema status every visit. Notes: Wound/Skin Impairment Nursing Diagnoses: Impaired tissue integrity Knowledge deficit related to ulceration/compromised skin integrity Goals: Patient/caregiver will verbalize understanding of skin care regimen Date Initiated: 09/14/2020 Target Resolution Date: 10/16/2020 Goal Status: Active Interventions: Assess patient/caregiver ability to obtain necessary supplies Assess patient/caregiver ability to perform ulcer/skin care regimen upon admission and as needed Assess ulceration(s) every visit Provide education on ulcer and skin care Notes: Electronic Signature(s) Signed: 10/09/2020 5:05:02 PM By: Baruch Gouty RN, BSN Entered By: Baruch Gouty on 10/09/2020 14:35:37 -------------------------------------------------------------------------------- Pain Assessment Details Patient Name: Date of Service: Kathleen Carbon RES M. 10/09/2020 1:15 PM Medical Record Number: [id number] Patient Account Number: [id number] Date of Birth/Sex: Treating RN: 06/03/1946 (75 y.o. Elam Dutch Primary Care Harshini Trent: Benito Mccreedy Other Clinician: Referring Leylanie Woodmansee: Treating Anira Senegal/Extender: Ivory Broad in Treatment: 3 Active Problems Location of Pain Severity and Description of Pain Patient Has Paino No Site Locations Pain Management and Medication Current Pain Management: Electronic Signature(s) Signed: 10/09/2020 5:05:02 PM By: Baruch Gouty RN, BSN Signed: 10/13/2020 11:11:03 AM By: Sandre Kitty Entered By: Sandre Kitty on 10/09/2020 13:40:20 -------------------------------------------------------------------------------- Patient/Caregiver Education Details Patient Name: Date of  Service: Kathleen Carbon RES M. 3/4/2022andnbsp1:15 PM Medical Record Number: [id number] Patient Account Number: [id number] Date of Birth/Gender: Treating RN: [birth year] (75 y.o. Elam Dutch Primary Care Physician: Benito Mccreedy Other Clinician: Referring Physician: Treating Physician/Extender: Ivory Broad in Treatment: 3 Education Assessment Education Provided To: Patient Education Topics Provided Elevated Blood Sugar/ Impact on Healing: Methods: Explain/Verbal Responses: Reinforcements needed, State content correctly Tissue Oxygenation: Methods: Explain/Verbal Responses: Reinforcements needed, State content correctly Wound/Skin Impairment: Methods: Explain/Verbal Responses: Reinforcements needed, State content correctly Electronic Signature(s) Signed: 10/09/2020 5:05:02 PM By: Baruch Gouty RN, BSN Entered By: Baruch Gouty on 10/09/2020 14:36:09 -------------------------------------------------------------------------------- Wound Assessment Details Patient Name: Date of Service: Kathleen Carbon RES M. 10/09/2020 1:15 PM Medical Record Number: [id number] Patient Account Number: [id number] Date of Birth/Sex: Treating RN: 10/14/1945 (75 y.o. Elam Dutch Primary Care Chesnee Floren: Benito Mccreedy Other Clinician: Referring Cederic Mozley: Treating Artie Mcintyre/Extender: Ivory Broad in Treatment: 3 Wound Status Wound Number: 10 Primary Diabetic Wound/Ulcer of the Lower Extremity Etiology: Wound Location: Left T Great oe Secondary Arterial Insufficiency Ulcer Wounding Event: Blister Etiology: Date Acquired:  07/14/2020 Wound Open Weeks Of Treatment: 3 Status: Clustered Wound: No Comorbid Cataracts, Glaucoma, Anemia, Asthma, Congestive Heart History: Failure, Coronary Artery Disease, Hypertension, Peripheral Arterial Disease, Type II Diabetes, Osteoarthritis Photos Wound Measurements Length: (cm)  0.1 Width: (cm) 0.1 Depth: (cm) 0.1 Area: (cm) 0.008 Volume: (cm) 0.001 % Reduction in Area: 97.7% % Reduction in Volume: 97.1% Epithelialization: Small (1-33%) Tunneling: No Undermining: No Wound Description Classification: Grade 2 Wound Margin: Distinct, outline attached Exudate Amount: Medium Exudate Type: Serosanguineous Exudate Color: red, brown Foul Odor After Cleansing: No Slough/Fibrino Yes Wound Bed Granulation Amount: Large (67-100%) Exposed Structure Granulation Quality: Red Fascia Exposed: No Necrotic Amount: Small (1-33%) Fat Layer (Subcutaneous Tissue) Exposed: Yes Necrotic Quality: Adherent Slough Tendon Exposed: No Muscle Exposed: No Joint Exposed: No Bone Exposed: No Treatment Notes Wound #10 (Toe Great) Wound Laterality: Left Cleanser Normal Saline Discharge Instruction: Cleanse the wound with Normal Saline or soap and water prior to applying a clean dressing using gauze sponges, not tissue or cotton balls. Peri-Wound Care Topical Primary Dressing KerraCel Ag Gelling Fiber Dressing, 2x2 in (silver alginate) Discharge Instruction: Apply silver alginate to wound bed as instructed Secondary Dressing Woven Gauze Sponges 2x2 in Discharge Instruction: Apply over primary dressing as directed. Secured With Conforming Stretch Gauze Bandage, Sterile 2x75 (in/in) Discharge Instruction: Secure with stretch gauze as directed. Paper Tape, 1x10 (in/yd) Discharge Instruction: Secure dressing with tape as directed. Compression Wrap Compression Stockings Add-Ons Electronic Signature(s) Signed: 10/13/2020 11:11:03 AM By: Sandre Kitty Signed: 10/13/2020 6:01:57 PM By: Baruch Gouty RN, BSN Previous Signature: 10/09/2020 5:05:02 PM Version By: Baruch Gouty RN, BSN Previous Signature: 10/12/2020 5:34:21 PM Version By: Rhae Hammock RN Entered By: Sandre Kitty on 10/13/2020  11:02:22 -------------------------------------------------------------------------------- Wound Assessment Details Patient Name: Date of Service: Kathleen Carbon RES M. 10/09/2020 1:15 PM Medical Record Number: [id number] Patient Account Number: [id number] Date of Birth/Sex: Treating RN: [birth year] (75 y.o. Elam Dutch Primary Care Oneida Mckamey: Benito Mccreedy Other Clinician: Referring Kiaan Overholser: Treating Corbyn Wildey/Extender: Ivory Broad in Treatment: 3 Wound Status Wound Number: 11 Primary Etiology: Diabetic Wound/Ulcer of the Lower Extremity Wound Location: Left, Medial Lower Leg Secondary Etiology: Venous Leg Ulcer Wounding Event: Blister Wound Status: Open Date Acquired: 07/14/2020 Weeks Of Treatment: 3 Clustered Wound: No Wound Measurements Length: (cm) Width: (cm) Depth: (cm) Area: (cm) Volume: (cm) 0 % Reduction in Area: 100% 0 % Reduction in Volume: 100% 0 0 0 Wound Description Classification: Grade 2 Electronic Signature(s) Signed: 10/09/2020 5:05:02 PM By: Baruch Gouty RN, BSN Signed: 10/13/2020 11:11:03 AM By: Sandre Kitty Entered By: Sandre Kitty on 10/09/2020 13:54:37 -------------------------------------------------------------------------------- Wound Assessment Details Patient Name: Date of Service: Kathleen Carbon RES M. 10/09/2020 1:15 PM Medical Record Number: [id number] Patient Account Number: [id number] Date of Birth/Sex: Treating RN: [birth year] (75 y.o. Elam Dutch Primary Care Fanchon Papania: Benito Mccreedy Other Clinician: Referring Dann Galicia: Treating Pyper Olexa/Extender: Ivory Broad in Treatment: 3 Wound Status Wound Number: 12 Primary Etiology: Diabetic Wound/Ulcer of the Lower Extremity Wound Location: Left, Anterior Lower Leg Secondary Etiology: Venous Leg Ulcer Wounding Event: Blister Wound Status: Open Date Acquired: 07/14/2020 Weeks Of Treatment: 3 Clustered  Wound: No Wound Measurements Length: (cm) Width: (cm) Depth: (cm) Area: (cm) Volume: (cm) 0 % Reduction in Area: 100% 0 % Reduction in Volume: 100% 0 0 0 Wound Description Classification: Grade 2 Electronic Signature(s) Signed: 10/09/2020 5:05:02 PM By: Baruch Gouty RN, BSN Signed: 10/13/2020 11:11:03 AM By: Sandre Kitty Entered By: Sandre Kitty on 10/09/2020 13:54:37 --------------------------------------------------------------------------------  Wound Assessment Details Patient Name: Date of Service: Kathleen Lah M. 10/09/2020 1:15 PM Medical Record Number: [id number] Patient Account Number: [id number] Date of Birth/Sex: Treating RN: [birth year] (75 y.o. Elam Dutch Primary Care Cherice Glennie: Benito Mccreedy Other Clinician: Referring Shakir Petrosino: Treating Orah Sonnen/Extender: Ivory Broad in Treatment: 3 Wound Status Wound Number: 9 Primary Diabetic Wound/Ulcer of the Lower Extremity Etiology: Wound Location: Right Calcaneus Wound Open Wounding Event: Blister Status: Date Acquired: 07/14/2020 Comorbid Cataracts, Glaucoma, Anemia, Asthma, Congestive Heart Failure, Weeks Of Treatment: 3 History: Coronary Artery Disease, Hypertension, Peripheral Arterial Clustered Wound: No Disease, Type II Diabetes, Osteoarthritis Photos Wound Measurements Length: (cm) 0.2 Width: (cm) 0.3 Depth: (cm) 0.1 Area: (cm) 0.047 Volume: (cm) 0.005 % Reduction in Area: 98.2% % Reduction in Volume: 98.1% Epithelialization: Large (67-100%) Tunneling: No Undermining: No Wound Description Classification: Grade 2 Wound Margin: Distinct, outline attached Exudate Amount: Medium Exudate Type: Serosanguineous Exudate Color: red, brown Foul Odor After Cleansing: No Slough/Fibrino Yes Wound Bed Granulation Amount: Medium (34-66%) Exposed Structure Granulation Quality: Pink Fascia Exposed: No Necrotic Amount: Medium (34-66%) Fat Layer  (Subcutaneous Tissue) Exposed: Yes Necrotic Quality: Adherent Slough Tendon Exposed: No Muscle Exposed: No Joint Exposed: No Bone Exposed: No Treatment Notes Wound #9 (Calcaneus) Wound Laterality: Right Cleanser Soap and Water Discharge Instruction: May shower and wash wound with dial antibacterial soap and water prior to dressing change. Peri-Wound Care Topical Primary Dressing KerraCel Ag Gelling Fiber Dressing, 2x2 in (silver alginate) Discharge Instruction: Apply silver alginate to wound bed as instructed Secondary Dressing Woven Gauze Sponge, Non-Sterile 4x4 in Discharge Instruction: Apply over primary dressing as directed. ALLEVYN Heel 4 1/2in x 5 1/2in / 10.5cm x 13.5cm Discharge Instruction: Apply over primary dressing as directed. Secured With [employer], 4.5x3.1 (in/yd) Discharge Instruction: Secure with Kerlix as directed. Paper Tape, 2x10 (in/yd) Discharge Instruction: Secure dressing with tape as directed. Compression Wrap Compression Stockings Add-Ons Electronic Signature(s) Signed: 10/13/2020 11:11:03 AM By: Sandre Kitty Signed: 10/13/2020 6:01:57 PM By: Baruch Gouty RN, BSN Previous Signature: 10/09/2020 5:05:02 PM Version By: Baruch Gouty RN, BSN Previous Signature: 10/12/2020 5:34:21 PM Version By: Rhae Hammock RN Previous Signature: 10/12/2020 5:34:21 PM Version By: Rhae Hammock RN Entered By: Sandre Kitty on 10/13/2020 11:03:06 -------------------------------------------------------------------------------- Vitals Details Patient Name: Date of Service: Kathleen [name], Kathleen RES M. 10/09/2020 1:15 PM Medical Record Number: [id number] Patient Account Number: [id number] Date of Birth/Sex: Treating RN: 02/26/1946 (75 y.o. Elam Dutch Primary Care Timmya Blazier: Benito Mccreedy Other Clinician: Referring Charnika Herbst: Treating Takiya Belmares/Extender: Ivory Broad in Treatment: 3 Vital Signs Time Taken:  13:41 Temperature (F): 97.7 Height (in): 63 Pulse (bpm): 89 Weight (lbs): 187 Respiratory Rate (breaths/min): 17 Body Mass Index (BMI): 33.1 Blood Pressure (mmHg): 148/82 Reference Range: 80 - 120 mg / dl Electronic Signature(s) Signed: 10/13/2020 11:11:03 AM By: Sandre Kitty Entered By: Sandre Kitty on 10/09/2020 13:40:14

## 2020-10-14 ENCOUNTER — Encounter (HOSPITAL_BASED_OUTPATIENT_CLINIC_OR_DEPARTMENT_OTHER): Payer: Medicaid Other | Admitting: Physician Assistant

## 2020-10-15 DIAGNOSIS — Z992 Dependence on renal dialysis: Secondary | ICD-10-CM | POA: Diagnosis not present

## 2020-10-15 DIAGNOSIS — N186 End stage renal disease: Secondary | ICD-10-CM | POA: Diagnosis not present

## 2020-10-15 DIAGNOSIS — N2581 Secondary hyperparathyroidism of renal origin: Secondary | ICD-10-CM | POA: Diagnosis not present

## 2020-10-17 DIAGNOSIS — N186 End stage renal disease: Secondary | ICD-10-CM | POA: Diagnosis not present

## 2020-10-17 DIAGNOSIS — N2581 Secondary hyperparathyroidism of renal origin: Secondary | ICD-10-CM | POA: Diagnosis not present

## 2020-10-17 DIAGNOSIS — Z992 Dependence on renal dialysis: Secondary | ICD-10-CM | POA: Diagnosis not present

## 2020-10-20 DIAGNOSIS — N2581 Secondary hyperparathyroidism of renal origin: Secondary | ICD-10-CM | POA: Diagnosis not present

## 2020-10-20 DIAGNOSIS — N186 End stage renal disease: Secondary | ICD-10-CM | POA: Diagnosis not present

## 2020-10-20 DIAGNOSIS — Z992 Dependence on renal dialysis: Secondary | ICD-10-CM | POA: Diagnosis not present

## 2020-10-22 DIAGNOSIS — N2581 Secondary hyperparathyroidism of renal origin: Secondary | ICD-10-CM | POA: Diagnosis not present

## 2020-10-22 DIAGNOSIS — Z992 Dependence on renal dialysis: Secondary | ICD-10-CM | POA: Diagnosis not present

## 2020-10-22 DIAGNOSIS — N186 End stage renal disease: Secondary | ICD-10-CM | POA: Diagnosis not present

## 2020-10-23 ENCOUNTER — Other Ambulatory Visit: Payer: Self-pay

## 2020-10-23 ENCOUNTER — Ambulatory Visit (HOSPITAL_COMMUNITY)
Admission: RE | Admit: 2020-10-23 | Discharge: 2020-10-23 | Disposition: A | Discharge status: Home / Self Care | Payer: Medicare HMO | Source: Ambulatory Visit | Attending: Cardiology | Admitting: Cardiology

## 2020-10-23 DIAGNOSIS — R0989 Other specified symptoms and signs involving the circulatory and respiratory systems: Secondary | ICD-10-CM | POA: Insufficient documentation

## 2020-10-24 DIAGNOSIS — Z992 Dependence on renal dialysis: Secondary | ICD-10-CM | POA: Diagnosis not present

## 2020-10-24 DIAGNOSIS — N186 End stage renal disease: Secondary | ICD-10-CM | POA: Diagnosis not present

## 2020-10-24 DIAGNOSIS — N2581 Secondary hyperparathyroidism of renal origin: Secondary | ICD-10-CM | POA: Diagnosis not present

## 2020-10-26 ENCOUNTER — Encounter (HOSPITAL_BASED_OUTPATIENT_CLINIC_OR_DEPARTMENT_OTHER): Payer: Medicare HMO | Admitting: Internal Medicine

## 2020-10-29 DIAGNOSIS — Z992 Dependence on renal dialysis: Secondary | ICD-10-CM | POA: Diagnosis not present

## 2020-10-29 DIAGNOSIS — N186 End stage renal disease: Secondary | ICD-10-CM | POA: Diagnosis not present

## 2020-10-29 DIAGNOSIS — N2581 Secondary hyperparathyroidism of renal origin: Secondary | ICD-10-CM | POA: Diagnosis not present

## 2020-10-30 ENCOUNTER — Encounter (HOSPITAL_BASED_OUTPATIENT_CLINIC_OR_DEPARTMENT_OTHER): Payer: Medicare HMO | Admitting: Internal Medicine

## 2020-10-30 ENCOUNTER — Other Ambulatory Visit (HOSPITAL_COMMUNITY)
Admission: RE | Admit: 2020-10-30 | Discharge: 2020-10-30 | Disposition: A | Discharge status: Home / Self Care | Payer: Medicare HMO | Source: Other Acute Inpatient Hospital | Attending: Internal Medicine | Admitting: Internal Medicine

## 2020-10-30 ENCOUNTER — Other Ambulatory Visit: Payer: Self-pay

## 2020-10-30 DIAGNOSIS — E1122 Type 2 diabetes mellitus with diabetic chronic kidney disease: Secondary | ICD-10-CM | POA: Diagnosis not present

## 2020-10-30 DIAGNOSIS — L97529 Non-pressure chronic ulcer of other part of left foot with unspecified severity: Secondary | ICD-10-CM | POA: Diagnosis not present

## 2020-10-30 DIAGNOSIS — L089 Local infection of the skin and subcutaneous tissue, unspecified: Secondary | ICD-10-CM | POA: Diagnosis not present

## 2020-10-30 DIAGNOSIS — L97822 Non-pressure chronic ulcer of other part of left lower leg with fat layer exposed: Secondary | ICD-10-CM | POA: Diagnosis not present

## 2020-10-30 DIAGNOSIS — E11621 Type 2 diabetes mellitus with foot ulcer: Secondary | ICD-10-CM | POA: Diagnosis not present

## 2020-10-30 DIAGNOSIS — L97411 Non-pressure chronic ulcer of right heel and midfoot limited to breakdown of skin: Secondary | ICD-10-CM | POA: Diagnosis not present

## 2020-10-30 DIAGNOSIS — I132 Hypertensive heart and chronic kidney disease with heart failure and with stage 5 chronic kidney disease, or end stage renal disease: Secondary | ICD-10-CM | POA: Diagnosis not present

## 2020-10-30 DIAGNOSIS — E1151 Type 2 diabetes mellitus with diabetic peripheral angiopathy without gangrene: Secondary | ICD-10-CM | POA: Diagnosis not present

## 2020-10-30 DIAGNOSIS — L97229 Non-pressure chronic ulcer of left calf with unspecified severity: Secondary | ICD-10-CM | POA: Diagnosis not present

## 2020-10-30 DIAGNOSIS — I872 Venous insufficiency (chronic) (peripheral): Secondary | ICD-10-CM | POA: Diagnosis not present

## 2020-10-30 DIAGNOSIS — L97521 Non-pressure chronic ulcer of other part of left foot limited to breakdown of skin: Secondary | ICD-10-CM | POA: Diagnosis not present

## 2020-10-30 DIAGNOSIS — E11622 Type 2 diabetes mellitus with other skin ulcer: Secondary | ICD-10-CM | POA: Diagnosis not present

## 2020-11-02 LAB — AEROBIC CULTURE W GRAM STAIN (SUPERFICIAL SPECIMEN): Gram Stain: NONE SEEN

## 2020-11-02 NOTE — Progress Notes (Signed)
FUSAE, FLORIO ([id number]) Visit Report for 10/30/2020 HPI Details Patient Name: Date of Service: [name] RES M. 10/30/2020 8:15 A M Medical Record Number: [id number] Patient Account Number: [id number] Date of Birth/Sex: Treating RN: 1946-04-24 (75 y.o. Sue Lush Primary Care Provider: Benito Mccreedy Other Clinician: Referring Provider: Treating Provider/Extender: Loma Sender in Treatment: 6 History of Present Illness HPI Description: 05/14/15; this is a 75 year old woman with a history of diabetes who was referred here I think after being seen in the emergency room on 9/29 with sudden edema of the right leg and a large resultant blister on the posterior medial part of the right leg. She had a previous blister about a week before her arrival there however this it open by the time she came to the emergency room. She has no history of known PAD, prior wound. She does have a history of a cardiomyopathy with her last echo on 10/15 showing an EF of 50-55%. She was given clindamycin orally and she is about to complete these in 2 days. She was felt to have cellulitis of the right lower extremity. 05/22/15; her duplex ultrasound was negative for DVT The Kerlix Coban appears to be controlling her edema. The substantial wound area on the right posterior . medial leg looks to be improved with epithelialization. She still has a reasonably substantial open area however I think this is going to heal there is no evidence of infection. Her peripheral pulses are not palpable in this foot. I think this lady probably has some degree of PAD as well. interestingly, she comes in with a crop of blisters just below her knee. These are itchy and if they have been more dermatomal I might of considered zoster. However I think this is probably some form of contact dermatitis question Coban oo 05/28/15. The area on the medial aspect of her right leg continues to be improved  in terms of advancing epithelialization. There is no evidence of infection. She probably has some degree of PAD. The contact dermatitis last blisters in the top of her right leg from last week are resolved. Finally she on the left leg she has a large blister which I opened this was flaccid she has edema in the left leg and I think this is the cause 06/05/15. Her right leg wound is completely epithelialized. There is no open wounds on her right leg. I had to remove the remaining skin from the blister on her left leg however this looks superficial and should be resolved next week. It also looks like she has some friction injury on the top of her right anterior first metatarsal head. I've advised to protect this in her shoes 06/11/15; she has now to open areas one posteriorly on the left leg and one anteriorly on the left. Still poorly controlled edema and may be developing a small additional blister on the right leg. None of this looks to have infection.she is obtained juxtalite stockings at home but she did not bring them today 06/19/15; patient returns today all of her wounds are closed. She has significant venous insufficiency with inflammation right greater than left considerable hemosiderin deposition on the right. We are able to discharge her in her juxtalite stockings. I gave her instructions for lubricating the skin her lower legs twice a day READMISSION; 02/05/16; this is a patient we had in this clinic from 10/7 through 11/4 2016. At that point I felt she had coexistent venous insufficiency and edema and PAD. She  presented with a blister and cellulitis on the medial right leg which ultimately form the wound which progressively epithelialized over several weeks and then resolved. During her stay here she does not seem to pad noninvasive arterial studies. She also developed large flaccid blisters on her left leg that formed wounds as well. We ultimately discharged. Juxtalite stockings to control  the swelling. The patient is a type II diabetic on insulin. He would appear that she has fairly significant chronic renal failure with the last creatinine on 4/22,017 at 3.36. Estimated GFR of 13. Last hemoglobin A1c of 9.0 in April 2017 the patient was seen by her primary physician Dr. Iona Beard Osei-Bonsu. He noted what sounds like cellulitis on the posterior left leg and a clean 6 cm x 5 cm ulcer with surrounding skin redness. She was put on antibiotics. The patient states she has not had any wounds on her legs since she was here in the fall. I'm not exactly sure how she treated the ulcer in her primary physician's office however she arrived with no ulcer on the left leg only eschar that peeled off to reveal normal skin. It is fairly clear that the patient is not wearing her juxtalite stockings. She is not describing shortness breath or chest pain but does describe heaviness in her legs when she walks forcing her to stop and rest. In this clinic her ABI on the right was 0.65 and on the left 0.54. 02/19/16; the patient arrived with a small hemorrhagic blister on the left anterior leg. There is nothing on the right. She's been to see Dr. Gwenlyn Found the formal studies showed an ABI in the right of 0.64 on the left of 0.59. TBI in the right 0.48 0.49 on the left. She had bilateral common femoral artery waves were triphasic. Bilateral popliteal peroneal anterior and posterior tibial arteries were monophasic. She has a follow-up with Dr. Gwenlyn Found next week 02/26/16; left leg blisters healed however she has a right leg blister this was excised with pickups and scissors. She's been to see Dr. Gwenlyn Found who notes her chronic renal failure, the reasonably unimpressive nature of her wounds and did not want to proceed with angiography. He will see her in 3 months 03/04/16; left leg blisters have healed however she did not come in with her juxtalite stockings. She has been to see Dr. Gwenlyn Found did not want to proceed with angiography.  Follow-up in 3 months. She developed a blister on the right last week we excised it. Dimensions are better 09/29/16- the patient made an appointment for a large blister on the anterior aspect of her right lower extremity since making the appointment the blister deroofed and has completely healed and epithelialized. She has not been using her juxta lites that were prescribed nor has she been wearing compression stockings. READMISSION 09/14/2020 This is a patient that we had in the clinic in 2016 and 17. She was last here in February 2018. These were largely for what was felt to be chronic venous insufficiency wounds on her bilateral lower extremities. At one point she had bilateral juxta lite stockings. She was also noted to have diabetic angiopathy with PAD. She went to see Dr. Gwenlyn Found but because the wounds were predominantly venous and she was in stage IV chronic renal failure nothing further was done about her PAD. She comes back in clinic today with wounds on her right heel, right first toe, left first toe and 3 wounds on the left anterior lower extremity. She is complaining of  a lot of pain in the left foot, right heel. She is not a smoker. Past medical history includes coronary artery disease with ischemic cardiomyopathy, type 2 diabetes, coronary artery disease, osteoarthritis, hypertension, chronic renal failure now on dialysis, hyperlipidemia, asthma ABIs in our clinic today were 0.5 on the right and 0.55 on the left 2/14; we do not have an appointment for vascular studies and we continue to work on this. She has areas on her left anterior lower leg which look mostly like venous wounds. She has areas on the right first toe tips right fourth toe tip and right heel. She is complaining a lot of pain however it is difficult to really determine how much of the pain she experiences is secondary to claudication. We wrapped both legs last time however it looks like she has wrap injury on the upper  medial right leg with blistering and excoriations. 2/21; arterial studies are on the 23rd with Dr. Kennon Holter office. The patient's areas on the left anterior lower leg which are mostly venous wounds are closed. On the right the tip of her first toe and fifth toe have eschar I did not remove this. There is still an open area on the right heel were using silver alginate 10/09/2020 upon evaluation today patient appears to be doing well with regard to her ulcerations. This is on the right heel and left great toe. With that being said she unfortunately does not have good blood flow in either extremity. In fact I did review her Doppler study and it appears that on the right she had an ABI of 0.75 which was dampened and a TBI of 0.16. On the left she had an ABI of 0.55 and absent signals in the toes we could not even get a TBI. Obviously this is even a bit worse than her previous evaluation. This indicates at least moderate right lower extremity arterial disease and moderate left lower extremity arterial disease. With that being said interestingly and in a good way the patient is actually showing some signs of improvement nonetheless with regard to her wounds. Fortunately there is no evidence of active infection at this time which is great news. No fevers, chills, nausea, vomiting, or diarrhea. 10/30/2020; this is a patient I have not seen in over a month but she was seen here 3 weeks ago. We have been following her for wounds on her right heel and left great toe paradoxically both of these are healed today. However she arrives in clinic today with swelling in her bilateral lower legs 3+ pitting edema and 3 wound areas that started as blisters on the left anterior left posterior and left lateral lower leg. These all look like basis of previously denuded blisters. Also worrisome is she has on the medial upper calf a crop of small vesicular blisters which are quite extensive. These are not painful or itchy. I am not  exactly sure what is causing this. She dialyzes at the [facility] on [address] Tuesday Thursday Saturday I had previously received a phone call 2 or 3 weeks ago from Dr. Gwenlyn Found although the patient has known PAD and did not have vibrant ABIs on the left an ABI of 0.55 and a TBI of 0.16 at the time she did not have open wounds and Dr. Gwenlyn Found did not feel he needed to do any interventions Electronic Signature(s) Signed: 11/02/2020 9:50:14 AM By: Linton Ham MD Entered By: Linton Ham on 10/30/2020 09:36:39 -------------------------------------------------------------------------------- Physical Exam Details Patient Name: Date of  Service: [name] RES M. 10/30/2020 8:15 A M Medical Record Number: [id number] Patient Account Number: [id number] Date of Birth/Sex: Treating RN: 1946/02/01 (75 y.o. Sue Lush Primary Care Provider: Benito Mccreedy Other Clinician: Referring Provider: Treating Provider/Extender: Loma Sender in Treatment: 6 Constitutional Sitting or standing Blood Pressure is within target range for patient.. Pulse regular and within target range for patient.[name] Respirations regular, non-labored and within target range.. Temperature is normal and within the target range for the patient.[name] Appears in no distress. Respiratory work of breathing is normal. Bilateral breath sounds are clear and equal in all lobes with no wheezes, rales or rhonchi.. Cardiovascular Heart rhythm and rate regular, without murmur or gallop. JVP is not elevated no sacral edema. Notes Wound exam; the patient has 3 new open areas 1 on the left anterior 1 on the left lateral and one on the left posterior all of these look like the remanence of denuded blisters. No obvious active infection. She also has a complete crop of blisters on the left upper medial calf. I open 1 and cultured this however I am not certain this represents an active infection certainly not  a bacterial infection. In another setting I would wonder about viral infection. Electronic Signature(s) Signed: 11/02/2020 9:50:14 AM By: Linton Ham MD Entered By: Linton Ham on 10/30/2020 09:39:18 -------------------------------------------------------------------------------- Physician Orders Details Patient Name: Date of Service: [name] RES M. 10/30/2020 8:15 A M Medical Record Number: [id number] Patient Account Number: [id number] Date of Birth/Sex: Treating RN: November 20, 1945 (75 y.o. Sue Lush Primary Care Provider: Benito Mccreedy Other Clinician: Referring Provider: Treating Provider/Extender: Loma Sender in Treatment: 6 Verbal / Phone Orders: No Diagnosis Coding ICD-10 Coding Code Description E11.51 Type 2 diabetes mellitus with diabetic peripheral angiopathy without gangrene I87.323 Chronic venous hypertension (idiopathic) with inflammation of bilateral lower extremity L97.411 Non-pressure chronic ulcer of right heel and midfoot limited to breakdown of skin L97.521 Non-pressure chronic ulcer of other part of left foot limited to breakdown of skin L97.822 Non-pressure chronic ulcer of other part of left lower leg with fat layer exposed E11.42 Type 2 diabetes mellitus with diabetic polyneuropathy Follow-up Appointments Return Appointment in 1 week. Bathing/ Shower/ Hygiene May shower with protection but do not get wound dressing(s) wet. Edema Control - Lymphedema / SCD / Other Elevate legs to the level of the heart or above for 30 minutes daily and/or when sitting, a frequency of: - throughout the day Avoid standing for long periods of time. Patient to wear own compression stockings every day. - Wear compression stockings on right leg: apply in morning and remove at bedtime. Exercise regularly Moisturize legs daily. - right leg and foot daily Additional Orders / Instructions Follow Nutritious Diet Wound Treatment Wound #13  - Lower Leg Wound Laterality: Left, Anterior Cleanser: Soap and Water 1 x Per Week/15 Days Discharge Instructions: May shower and wash wound with dial antibacterial soap and water prior to dressing change. Peri-Wound Care: Triamcinolone 15 (g) 1 x Per Week/15 Days Discharge Instructions: Apply liberally to left leg Prim Dressing: KerraCel Ag Gelling Fiber Dressing, 4x5 in (silver alginate) 1 x Per Week/15 Days ary Discharge Instructions: Apply silver alginate to wound bed as instructed Secondary Dressing: Woven Gauze Sponge, Non-Sterile 4x4 in 1 x Per Week/15 Days Discharge Instructions: Apply over primary dressing as directed. Secondary Dressing: ABD Pad, 8x10 1 x Per Week/15 Days Discharge Instructions: Apply over primary dressing as directed. Compression Wrap: Kerlix Roll 4.5x3.1 (in/yd) 1  x Per Week/15 Days Discharge Instructions: Apply Kerlix and Coban compression as directed. Compression Wrap: Coban Self-Adherent Wrap 4x5 (in/yd) 1 x Per Week/15 Days Discharge Instructions: Apply over Kerlix as directed. Wound #14 - Lower Leg Wound Laterality: Right, Lateral Cleanser: Soap and Water 1 x Per Week/15 Days Discharge Instructions: May shower and wash wound with dial antibacterial soap and water prior to dressing change. Peri-Wound Care: Triamcinolone 15 (g) 1 x Per Week/15 Days Discharge Instructions: Apply liberally to left leg Prim Dressing: KerraCel Ag Gelling Fiber Dressing, 4x5 in (silver alginate) 1 x Per Week/15 Days ary Discharge Instructions: Apply silver alginate to wound bed as instructed Secondary Dressing: Woven Gauze Sponge, Non-Sterile 4x4 in 1 x Per Week/15 Days Discharge Instructions: Apply over primary dressing as directed. Secondary Dressing: ABD Pad, 8x10 1 x Per Week/15 Days Discharge Instructions: Apply over primary dressing as directed. Compression Wrap: Kerlix Roll 4.5x3.1 (in/yd) 1 x Per Week/15 Days Discharge Instructions: Apply Kerlix and Coban compression as  directed. Compression Wrap: Coban Self-Adherent Wrap 4x5 (in/yd) 1 x Per Week/15 Days Discharge Instructions: Apply over Kerlix as directed. Wound #15 - Lower Leg Wound Laterality: Left, Posterior Cleanser: Soap and Water 1 x Per Week/15 Days Discharge Instructions: May shower and wash wound with dial antibacterial soap and water prior to dressing change. Peri-Wound Care: Triamcinolone 15 (g) 1 x Per Week/15 Days Discharge Instructions: Apply liberally to left leg Prim Dressing: KerraCel Ag Gelling Fiber Dressing, 4x5 in (silver alginate) 1 x Per Week/15 Days ary Discharge Instructions: Apply silver alginate to wound bed as instructed Secondary Dressing: Woven Gauze Sponge, Non-Sterile 4x4 in 1 x Per Week/15 Days Discharge Instructions: Apply over primary dressing as directed. Secondary Dressing: ABD Pad, 8x10 1 x Per Week/15 Days Discharge Instructions: Apply over primary dressing as directed. Compression Wrap: Kerlix Roll 4.5x3.1 (in/yd) 1 x Per Week/15 Days Discharge Instructions: Apply Kerlix and Coban compression as directed. Compression Wrap: Coban Self-Adherent Wrap 4x5 (in/yd) 1 x Per Week/15 Days Discharge Instructions: Apply over Kerlix as directed. Electronic Signature(s) Signed: 10/30/2020 6:16:52 PM By: Lorrin Jackson Signed: 11/02/2020 9:50:14 AM By: Linton Ham MD Previous Signature: 10/30/2020 8:32:22 AM Version By: Lorrin Jackson Entered By: Lorrin Jackson on 10/30/2020 09:32:07 -------------------------------------------------------------------------------- Problem List Details Patient Name: Date of Service: [name] RES M. 10/30/2020 8:15 A M Medical Record Number: [id number] Patient Account Number: [id number] Date of Birth/Sex: Treating RN: 1945/10/11 (75 y.o. Sue Lush Primary Care Provider: Benito Mccreedy Other Clinician: Referring Provider: Treating Provider/Extender: Loma Sender in Treatment: 6 Active  Problems ICD-10 Encounter Code Description Active Date MDM Diagnosis E11.51 Type 2 diabetes mellitus with diabetic peripheral angiopathy without gangrene 09/14/2020 No Yes I87.323 Chronic venous hypertension (idiopathic) with inflammation of bilateral lower 09/14/2020 No Yes extremity L97.822 Non-pressure chronic ulcer of other part of left lower leg with fat layer exposed2/02/2021 No Yes E11.42 Type 2 diabetes mellitus with diabetic polyneuropathy 09/14/2020 No Yes Inactive Problems ICD-10 Code Description Active Date Inactive Date L97.411 Non-pressure chronic ulcer of right heel and midfoot limited to breakdown of skin 09/14/2020 09/14/2020 L97.521 Non-pressure chronic ulcer of other part of left foot limited to breakdown of skin 09/14/2020 09/14/2020 Resolved Problems Electronic Signature(s) Signed: 11/02/2020 9:50:14 AM By: Linton Ham MD Previous Signature: 10/30/2020 9:00:03 AM Version By: Lorrin Jackson Previous Signature: 10/30/2020 8:31:36 AM Version By: Lorrin Jackson Entered By: Linton Ham on 10/30/2020 09:34:19 -------------------------------------------------------------------------------- Progress Note Details Patient Name: Date of Service: [name], DELO RES M. 10/30/2020 8:15 A M  Medical Record Number: [id number] Patient Account Number: [id number] Date of Birth/Sex: Treating RN: 08-May-1946 (75 y.o. Sue Lush Primary Care Provider: Benito Mccreedy Other Clinician: Referring Provider: Treating Provider/Extender: Loma Sender in Treatment: 6 Subjective History of Present Illness (HPI) 05/14/15; this is a 75 year old woman with a history of diabetes who was referred here I think after being seen in the emergency room on 9/29 with sudden edema of the right leg and a large resultant blister on the posterior medial part of the right leg. She had a previous blister about a week before her arrival there however this it open by the time she came to  the emergency room. She has no history of known PAD, prior wound. She does have a history of a cardiomyopathy with her last echo on 10/15 showing an EF of 50-55%. She was given clindamycin orally and she is about to complete these in 2 days. She was felt to have cellulitis of the right lower extremity. 05/22/15; her duplex ultrasound was negative for DVT The Kerlix Coban appears to be controlling her edema. The substantial wound area on the right posterior . medial leg looks to be improved with epithelialization. She still has a reasonably substantial open area however I think this is going to heal there is no evidence of infection. Her peripheral pulses are not palpable in this foot. I think this lady probably has some degree of PAD as well. interestingly, she comes in with a crop of blisters just below her knee. These are itchy and if they have been more dermatomal I might of considered zoster. However I think this is probably some form of contact dermatitis question Coban oo 05/28/15. The area on the medial aspect of her right leg continues to be improved in terms of advancing epithelialization. There is no evidence of infection. She probably has some degree of PAD. The contact dermatitis last blisters in the top of her right leg from last week are resolved. Finally she on the left leg she has a large blister which I opened this was flaccid she has edema in the left leg and I think this is the cause 06/05/15. Her right leg wound is completely epithelialized. There is no open wounds on her right leg. I had to remove the remaining skin from the blister on her left leg however this looks superficial and should be resolved next week. It also looks like she has some friction injury on the top of her right anterior first metatarsal head. I've advised to protect this in her shoes 06/11/15; she has now to open areas one posteriorly on the left leg and one anteriorly on the left. Still poorly controlled  edema and may be developing a small additional blister on the right leg. None of this looks to have infection.she is obtained juxtalite stockings at home but she did not bring them today 06/19/15; patient returns today all of her wounds are closed. She has significant venous insufficiency with inflammation right greater than left considerable hemosiderin deposition on the right. We are able to discharge her in her juxtalite stockings. I gave her instructions for lubricating the skin her lower legs twice a day READMISSION; 02/05/16; this is a patient we had in this clinic from 10/7 through 11/4 2016. At that point I felt she had coexistent venous insufficiency and edema and PAD. She presented with a blister and cellulitis on the medial right leg which ultimately form the wound which progressively epithelialized over several weeks and  then resolved. During her stay here she does not seem to pad noninvasive arterial studies. She also developed large flaccid blisters on her left leg that formed wounds as well. We ultimately discharged. Juxtalite stockings to control the swelling. The patient is a type II diabetic on insulin. He would appear that she has fairly significant chronic renal failure with the last creatinine on 4/22,017 at 3.36. Estimated GFR of 13. Last hemoglobin A1c of 9.0 in April 2017 the patient was seen by her primary physician Dr. Iona Beard Osei-Bonsu. He noted what sounds like cellulitis on the posterior left leg and a clean 6 cm x 5 cm ulcer with surrounding skin redness. She was put on antibiotics. The patient states she has not had any wounds on her legs since she was here in the fall. I'm not exactly sure how she treated the ulcer in her primary physician's office however she arrived with no ulcer on the left leg only eschar that peeled off to reveal normal skin. It is fairly clear that the patient is not wearing her juxtalite stockings. She is not describing shortness breath or chest  pain but does describe heaviness in her legs when she walks forcing her to stop and rest. In this clinic her ABI on the right was 0.65 and on the left 0.54. 02/19/16; the patient arrived with a small hemorrhagic blister on the left anterior leg. There is nothing on the right. She's been to see Dr. Gwenlyn Found the formal studies showed an ABI in the right of 0.64 on the left of 0.59. TBI in the right 0.48 0.49 on the left. She had bilateral common femoral artery waves were triphasic. Bilateral popliteal peroneal anterior and posterior tibial arteries were monophasic. She has a follow-up with Dr. Gwenlyn Found next week 02/26/16; left leg blisters healed however she has a right leg blister this was excised with pickups and scissors. She's been to see Dr. Gwenlyn Found who notes her chronic renal failure, the reasonably unimpressive nature of her wounds and did not want to proceed with angiography. He will see her in 3 months 03/04/16; left leg blisters have healed however she did not come in with her juxtalite stockings. She has been to see Dr. Gwenlyn Found did not want to proceed with angiography. Follow-up in 3 months. She developed a blister on the right last week we excised it. Dimensions are better 09/29/16- the patient made an appointment for a large blister on the anterior aspect of her right lower extremity since making the appointment the blister deroofed and has completely healed and epithelialized. She has not been using her juxta lites that were prescribed nor has she been wearing compression stockings. READMISSION 09/14/2020 This is a patient that we had in the clinic in 2016 and 17. She was last here in February 2018. These were largely for what was felt to be chronic venous insufficiency wounds on her bilateral lower extremities. At one point she had bilateral juxta lite stockings. She was also noted to have diabetic angiopathy with PAD. She went to see Dr. Gwenlyn Found but because the wounds were predominantly venous and she  was in stage IV chronic renal failure nothing further was done about her PAD. She comes back in clinic today with wounds on her right heel, right first toe, left first toe and 3 wounds on the left anterior lower extremity. She is complaining of a lot of pain in the left foot, right heel. She is not a smoker. Past medical history includes coronary artery disease with  ischemic cardiomyopathy, type 2 diabetes, coronary artery disease, osteoarthritis, hypertension, chronic renal failure now on dialysis, hyperlipidemia, asthma ABIs in our clinic today were 0.5 on the right and 0.55 on the left 2/14; we do not have an appointment for vascular studies and we continue to work on this. She has areas on her left anterior lower leg which look mostly like venous wounds. She has areas on the right first toe tips right fourth toe tip and right heel. She is complaining a lot of pain however it is difficult to really determine how much of the pain she experiences is secondary to claudication. We wrapped both legs last time however it looks like she has wrap injury on the upper medial right leg with blistering and excoriations. 2/21; arterial studies are on the 23rd with Dr. Kennon Holter office. The patient's areas on the left anterior lower leg which are mostly venous wounds are closed. On the right the tip of her first toe and fifth toe have eschar I did not remove this. There is still an open area on the right heel were using silver alginate 10/09/2020 upon evaluation today patient appears to be doing well with regard to her ulcerations. This is on the right heel and left great toe. With that being said she unfortunately does not have good blood flow in either extremity. In fact I did review her Doppler study and it appears that on the right she had an ABI of 0.75 which was dampened and a TBI of 0.16. On the left she had an ABI of 0.55 and absent signals in the toes we could not even get a TBI. Obviously this is even a  bit worse than her previous evaluation. This indicates at least moderate right lower extremity arterial disease and moderate left lower extremity arterial disease. With that being said interestingly and in a good way the patient is actually showing some signs of improvement nonetheless with regard to her wounds. Fortunately there is no evidence of active infection at this time which is great news. No fevers, chills, nausea, vomiting, or diarrhea. 10/30/2020; this is a patient I have not seen in over a month but she was seen here 3 weeks ago. We have been following her for wounds on her right heel and left great toe paradoxically both of these are healed today. However she arrives in clinic today with swelling in her bilateral lower legs 3+ pitting edema and 3 wound areas that started as blisters on the left anterior left posterior and left lateral lower leg. These all look like basis of previously denuded blisters. Also worrisome is she has on the medial upper calf a crop of small vesicular blisters which are quite extensive. These are not painful or itchy. I am not exactly sure what is causing this. She dialyzes at the [facility] on [place]., Tuesday Thursday Saturday I had previously received a phone call 2 or 3 weeks ago from Dr. Gwenlyn Found although the patient has known PAD and did not have vibrant ABIs on the left an ABI of 0.55 and a TBI of 0.16 at the time she did not have open wounds and Dr. Gwenlyn Found did not feel he needed to do any interventions Objective Constitutional Sitting or standing Blood Pressure is within target range for patient.. Pulse regular and within target range for patient.[name] Respirations regular, non-labored and within target range.. Temperature is normal and within the target range for the patient.[name] Appears in no distress. Vitals Time Taken: 8:28 AM, Height:  63 in, Weight: 187 lbs, BMI: 33.1, Temperature: 97.8 F, Pulse: 85 bpm, Respiratory Rate: 17 breaths/min, Blood  Pressure: 142/78 mmHg. Respiratory work of breathing is normal. Bilateral breath sounds are clear and equal in all lobes with no wheezes, rales or rhonchi.. Cardiovascular Heart rhythm and rate regular, without murmur or gallop. JVP is not elevated no sacral edema. General Notes: Wound exam; the patient has 3 new open areas 1 on the left anterior 1 on the left lateral and one on the left posterior all of these look like the remanence of denuded blisters. No obvious active infection. ooShe also has a complete crop of blisters on the left upper medial calf. I open 1 and cultured this however I am not certain this represents an active infection certainly not a bacterial infection. In another setting I would wonder about viral infection. Integumentary (Hair, Skin) Wound #10 status is Healed - Epithelialized. Original cause of wound was Blister. The date acquired was: 07/14/2020. The wound has been in treatment 6 weeks. The wound is located on the Left T Great. The wound measures 0cm length x 0cm width x 0cm depth; 0cm^2 area and 0cm^3 volume. oe Wound #13 status is Open. Original cause of wound was Blister. The date acquired was: 10/23/2020. The wound is located on the Left,Anterior Lower Leg. The wound measures 9cm length x 5.5cm width x 0.1cm depth; 38.877cm^2 area and 3.888cm^3 volume. Wound #14 status is Open. Original cause of wound was Blister. The date acquired was: 10/23/2020. The wound is located on the Right,Lateral Lower Leg. The wound measures 3.7cm length x 2.2cm width x 0.1cm depth; 6.393cm^2 area and 0.639cm^3 volume. Wound #15 status is Open. Original cause of wound was Gradually Appeared. The date acquired was: 10/23/2020. The wound is located on the Left,Posterior Lower Leg. The wound measures 0.4cm length x 0.3cm width x 0.2cm depth; 0.094cm^2 area and 0.019cm^3 volume. Wound #9 status is Healed - Epithelialized. Original cause of wound was Blister. The date acquired was: 07/14/2020.  The wound has been in treatment 6 weeks. The wound is located on the Right Calcaneus. The wound measures 0cm length x 0cm width x 0cm depth; 0cm^2 area and 0cm^3 volume. Assessment Active Problems ICD-10 Type 2 diabetes mellitus with diabetic peripheral angiopathy without gangrene Chronic venous hypertension (idiopathic) with inflammation of bilateral lower extremity Non-pressure chronic ulcer of other part of left lower leg with fat layer exposed Type 2 diabetes mellitus with diabetic polyneuropathy Plan Follow-up Appointments: Return Appointment in 1 week. Bathing/ Shower/ Hygiene: May shower with protection but do not get wound dressing(s) wet. Edema Control - Lymphedema / SCD / Other: Elevate legs to the level of the heart or above for 30 minutes daily and/or when sitting, a frequency of: - throughout the day Avoid standing for long periods of time. Patient to wear own compression stockings every day. - Wear compression stockings on right leg: apply in morning and remove at bedtime. Exercise regularly Moisturize legs daily. - right leg and foot daily Additional Orders / Instructions: Follow Nutritious Diet WOUND #13: - Lower Leg Wound Laterality: Left, Anterior Cleanser: Soap and Water 1 x Per Week/15 Days Discharge Instructions: May shower and wash wound with dial antibacterial soap and water prior to dressing change. Peri-Wound Care: Triamcinolone 15 (g) 1 x Per Week/15 Days Discharge Instructions: Apply liberally to left leg Prim Dressing: KerraCel Ag Gelling Fiber Dressing, 4x5 in (silver alginate) 1 x Per Week/15 Days ary Discharge Instructions: Apply silver alginate to wound bed as  instructed Secondary Dressing: Woven Gauze Sponge, Non-Sterile 4x4 in 1 x Per Week/15 Days Discharge Instructions: Apply over primary dressing as directed. Secondary Dressing: ABD Pad, 8x10 1 x Per Week/15 Days Discharge Instructions: Apply over primary dressing as directed. Com pression Wrap:  Kerlix Roll 4.5x3.1 (in/yd) 1 x Per Week/15 Days Discharge Instructions: Apply Kerlix and Coban compression as directed. Com pression Wrap: Coban Self-Adherent Wrap 4x5 (in/yd) 1 x Per Week/15 Days Discharge Instructions: Apply over Kerlix as directed. WOUND #14: - Lower Leg Wound Laterality: Right, Lateral Cleanser: Soap and Water 1 x Per Week/15 Days Discharge Instructions: May shower and wash wound with dial antibacterial soap and water prior to dressing change. Peri-Wound Care: Triamcinolone 15 (g) 1 x Per Week/15 Days Discharge Instructions: Apply liberally to left leg Prim Dressing: KerraCel Ag Gelling Fiber Dressing, 4x5 in (silver alginate) 1 x Per Week/15 Days ary Discharge Instructions: Apply silver alginate to wound bed as instructed Secondary Dressing: Woven Gauze Sponge, Non-Sterile 4x4 in 1 x Per Week/15 Days Discharge Instructions: Apply over primary dressing as directed. Secondary Dressing: ABD Pad, 8x10 1 x Per Week/15 Days Discharge Instructions: Apply over primary dressing as directed. Com pression Wrap: Kerlix Roll 4.5x3.1 (in/yd) 1 x Per Week/15 Days Discharge Instructions: Apply Kerlix and Coban compression as directed. Com pression Wrap: Coban Self-Adherent Wrap 4x5 (in/yd) 1 x Per Week/15 Days Discharge Instructions: Apply over Kerlix as directed. WOUND #15: - Lower Leg Wound Laterality: Left, Posterior Cleanser: Soap and Water 1 x Per Week/15 Days Discharge Instructions: May shower and wash wound with dial antibacterial soap and water prior to dressing change. Peri-Wound Care: Triamcinolone 15 (g) 1 x Per Week/15 Days Discharge Instructions: Apply liberally to left leg Prim Dressing: KerraCel Ag Gelling Fiber Dressing, 4x5 in (silver alginate) 1 x Per Week/15 Days ary Discharge Instructions: Apply silver alginate to wound bed as instructed Secondary Dressing: Woven Gauze Sponge, Non-Sterile 4x4 in 1 x Per Week/15 Days Discharge Instructions: Apply over primary  dressing as directed. Secondary Dressing: ABD Pad, 8x10 1 x Per Week/15 Days Discharge Instructions: Apply over primary dressing as directed. Com pression Wrap: Kerlix Roll 4.5x3.1 (in/yd) 1 x Per Week/15 Days Discharge Instructions: Apply Kerlix and Coban compression as directed. Com pression Wrap: Coban Self-Adherent Wrap 4x5 (in/yd) 1 x Per Week/15 Days Discharge Instructions: Apply over Kerlix as directed. 1. The blisters on her left leg have opened into wounds. This is easy enough to explain on and addressed 2. On the medial upper calf a hole substantial crop of small raised blisters the exact etiology of this is unclear. I have cultured 1 of these. This is not the usual pattern you see from systemic fluid volume overload which is more well represented by the 3 wounds on her distal lower extremity. No empiric antibiotic 3. She has severe chronic venous insufficiency however I am not sure this explains all of this. 4. Also has known PAD. When Dr. Gwenlyn Found saw her earlier this month she had no open areas and he did not feel he needed to intervene on the left leg unless there was further deterioration. 5. She does not complain of pain, pruritus, burning etc. 6. We dressed the wound with silver alginate the 3 open areas. Liberal TCA on the entire lower leg I put her in kerlix Coban I am doubtful that her arterial status could stand more than this 7. Her original wounds are healed however now we have a new area on the left leg 8. I am going to  try to get her stockings for the right leg Electronic Signature(s) Signed: 11/02/2020 9:50:14 AM By: Linton Ham MD Entered By: Linton Ham on 10/30/2020 09:41:45 -------------------------------------------------------------------------------- SuperBill Details Patient Name: Date of Service: [name] RES M. 10/30/2020 Medical Record Number: [id number] Patient Account Number: [id number] Date of Birth/Sex: Treating RN: 1945/08/16 (75 y.o. Sue Lush Primary Care Provider: Benito Mccreedy Other Clinician: Referring Provider: Treating Provider/Extender: Loma Sender in Treatment: 6 Diagnosis Coding ICD-10 Codes Code Description E11.51 Type 2 diabetes mellitus with diabetic peripheral angiopathy without gangrene I87.323 Chronic venous hypertension (idiopathic) with inflammation of bilateral lower extremity L97.411 Non-pressure chronic ulcer of right heel and midfoot limited to breakdown of skin L97.521 Non-pressure chronic ulcer of other part of left foot limited to breakdown of skin L97.822 Non-pressure chronic ulcer of other part of left lower leg with fat layer exposed E11.42 Type 2 diabetes mellitus with diabetic polyneuropathy Facility Procedures CPT4 Code: 11572620 Description: 99214 - WOUND CARE VISIT-LEV 4 EST PT Modifier: Quantity: 1 Physician Procedures : CPT4 Code Description Modifier [id number] - WC PHYS LEVEL 4 - EST PT ICD-10 Diagnosis Description L97.822 Non-pressure chronic ulcer of other part of left lower leg with fat layer exposed I87.323 Chronic venous hypertension (idiopathic) with  inflammation of bilateral lower extremity E11.51 Type 2 diabetes mellitus with diabetic peripheral angiopathy without gangrene Quantity: 1 Electronic Signature(s) Signed: 11/02/2020 9:50:14 AM By: Linton Ham MD Entered By: Linton Ham on 10/30/2020 09:42:09

## 2020-11-04 NOTE — Progress Notes (Signed)
Kathleen [name], Kathleen [name] ([id number]) Visit Report for 10/30/2020 Arrival Information Details Patient Name: Date of Service: Kathleen [name]. 10/30/2020 8:15 Kathleen [name] Medical Record Number: [id number] Patient Account Number: [id number] Date of Birth/Sex: Treating RN: 10-21-1945 (75 y.o. Kathleen [name] Primary Care Castle Lamons: Kathleen [name] Other Clinician: Referring Kathleen [name]: Treating Kathleen [name]/Extender: Kathleen [name] in Treatment: 6 Visit Information History Since Last Visit Added or deleted any medications: No Patient Arrived: Wheel Chair Any new allergies or adverse reactions: No Arrival Time: 08:27 Had Kathleen fall or experienced change in No Accompanied By: daughter activities of daily living that may affect Transfer Assistance: None risk of falls: Patient Identification Verified: Yes Signs or symptoms of abuse/neglect since last visito No Secondary Verification Process Completed: Yes Hospitalized since last visit: No Patient Requires Transmission-Based Precautions: No Implantable device outside of the clinic excluding No Patient Has Alerts: No cellular tissue based products placed in the center since last visit: Has Dressing in Place as Prescribed: Yes Pain Present Now: No Electronic Signature(s) Signed: 11/04/2020 9:05:34 AM By: Kathleen [name] Entered By: Kathleen [name] on 10/30/2020 08:28:13 -------------------------------------------------------------------------------- Clinic Level of Care Assessment Details Patient Name: Date of Service: Kathleen [name]. 10/30/2020 8:15 Kathleen [name] Medical Record Number: [id number] Patient Account Number: [id number] Date of Birth/Sex: Treating RN: 1945-12-01 (75 y.o. Kathleen [name] Primary Care Kathleen [name]: Kathleen [name] Other Clinician: Referring Kathleen [name]: Treating Kathleen [name]/Extender: Kathleen [name] in Treatment: 6 Clinic Level of Care Assessment Items TOOL 4 Quantity Score X- 1  0 Use when only an EandM is performed on FOLLOW-UP visit ASSESSMENTS - Nursing Assessment / Reassessment X- 1 10 Reassessment of Co-morbidities (includes updates in patient status) X- 1 5 Reassessment of Adherence to Treatment Plan ASSESSMENTS - Wound and Skin Kathleen ssessment / Reassessment []  - 0 Simple Wound Assessment / Reassessment - one wound X- 3 5 Complex Wound Assessment / Reassessment - multiple wounds []  - 0 Dermatologic / Skin Assessment (not related to wound area) ASSESSMENTS - Focused Assessment []  - 0 Circumferential Edema Measurements - multi extremities []  - 0 Nutritional Assessment / Counseling / Intervention []  - 0 Lower Extremity Assessment (monofilament, tuning fork, pulses) []  - 0 Peripheral Arterial Disease Assessment (using hand held doppler) ASSESSMENTS - Ostomy and/or Continence Assessment and Care []  - 0 Incontinence Assessment and Management []  - 0 Ostomy Care Assessment and Management (repouching, etc.) PROCESS - Coordination of Care []  - 0 Simple Patient / Family Education for ongoing care X- 1 20 Complex (extensive) Patient / Family Education for ongoing care X- 1 10 Staff obtains Programmer, systems, Records, T Results / Process Orders est []  - 0 Staff telephones HHA, Nursing Homes / Clarify orders / etc []  - 0 Routine Transfer to another Facility (non-emergent condition) []  - 0 Routine Hospital Admission (non-emergent condition) []  - 0 New Admissions / Biomedical engineer / Ordering NPWT Apligraf, etc. , []  - 0 Emergency Hospital Admission (emergent condition) []  - 0 Simple Discharge Coordination []  - 0 Complex (extensive) Discharge Coordination PROCESS - Special Needs []  - 0 Pediatric / Minor Patient Management []  - 0 Isolation Patient Management []  - 0 Hearing / Language / Visual special needs []  - 0 Assessment of Community assistance (transportation, D/C planning, etc.) []  - 0 Additional assistance / Altered mentation []  -  0 Support Surface(s) Assessment (bed, cushion, seat, etc.) INTERVENTIONS - Wound Cleansing / Measurement []  - 0 Simple Wound Cleansing - one wound X- 3 5 Complex Wound Cleansing - multiple wounds []  -  0 Wound Imaging (photographs - any number of wounds) X- 1 5 Wound Tracing (instead of photographs) []  - 0 Simple Wound Measurement - one wound X- 3 5 Complex Wound Measurement - multiple wounds INTERVENTIONS - Wound Dressings []  - 0 Small Wound Dressing one or multiple wounds []  - 0 Medium Wound Dressing one or multiple wounds X- 1 20 Large Wound Dressing one or multiple wounds []  - 0 Application of Medications - topical []  - 0 Application of Medications - injection INTERVENTIONS - Miscellaneous []  - 0 External ear exam X- 1 5 Specimen Collection (cultures, biopsies, blood, body fluids, etc.) []  - 0 Specimen(s) / Culture(s) sent or taken to Lab for analysis []  - 0 Patient Transfer (multiple staff / Civil Service fast streamer / Similar devices) []  - 0 Simple Staple / Suture removal (25 or less) []  - 0 Complex Staple / Suture removal (26 or more) []  - 0 Hypo / Hyperglycemic Management (close monitor of Blood Glucose) []  - 0 Ankle / Brachial Index (ABI) - do not check if billed separately X- 1 5 Vital Signs Has the patient been seen at the hospital within the last three years: Yes Total Score: 125 Level Of Care: New/Established - Level 4 Electronic Signature(s) Signed: 10/30/2020 6:16:52 PM By: Kathleen [name] Entered By: Kathleen [name] on 10/30/2020 09:33:26 -------------------------------------------------------------------------------- Encounter Discharge Information Details Patient Name: Date of Service: Kathleen [name]. 10/30/2020 8:15 Kathleen [name] Medical Record Number: [id number] Patient Account Number: [id number] Date of Birth/Sex: Treating RN: 24-May-1946 (75 y.o. Kathleen [name] Primary Care Diamon Reddinger: Kathleen [name] Other Clinician: Referring Kathleen [name]: Treating  Kathleen [name]/Extender: Kathleen [name] in Treatment: 6 Encounter Discharge Information Items Discharge Condition: Stable Ambulatory Status: Wheelchair Discharge Destination: Home Transportation: Private Auto Accompanied By: daughter Schedule Follow-up Appointment: Yes Clinical Summary of Care: Electronic Signature(s) Signed: 10/30/2020 6:38:23 PM By: Deon Pilling Entered By: Deon Pilling on 10/30/2020 09:54:11 -------------------------------------------------------------------------------- Lower Extremity Assessment Details Patient Name: Date of Service: Kathleen [name]. 10/30/2020 8:15 Kathleen [name] Medical Record Number: [id number] Patient Account Number: [id number] Date of Birth/Sex: Treating RN: [birth year] (75 y.o. Kathleen [name] Primary Care Dionna Wiedemann: Kathleen [name] Other Clinician: Referring Makoto Sellitto: Treating Shavonne Ambroise/Extender: Kathleen [name] in Treatment: 6 Edema Assessment Assessed: Shirlyn Goltz: Yes] Patrice Paradise: Yes] Edema: [Left: No] [Right: Yes] Calf Left: Right: Point of Measurement: 28 cm From Medial Instep 34 cm 36.7 cm Ankle Left: Right: Point of Measurement: 9 cm From Medial Instep 18.5 cm 21 cm Knee To Floor Left: Right: From Medial Instep 38 cm 38 cm Electronic Signature(s) Signed: 10/30/2020 6:16:52 PM By: Kathleen [name] Entered By: Kathleen [name] on 10/30/2020 09:34:28 -------------------------------------------------------------------------------- Multi Wound Chart Details Patient Name: Date of Service: Kathleen [name]. 10/30/2020 8:15 Kathleen [name] Medical Record Number: [id number] Patient Account Number: [id number] Date of Birth/Sex: Treating RN: May 07, 1946 (75 y.o. Kathleen [name] Primary Care Adonus Uselman: Kathleen [name] Other Clinician: Referring Emiko Osorto: Treating Eljay Lave/Extender: Kathleen [name] in Treatment: 6 Vital Signs Height(in): 82 Pulse(bpm): 36 Weight(lbs): 187 Blood  Pressure(mmHg): 142/78 Body Mass Index(BMI): 33 Temperature(F): 97.8 Respiratory Rate(breaths/min): 17 Photos: [10:No Photos Left T Great oe] [13:No Photos Left, Anterior Lower Leg] [14:No Photos Right, Lateral Lower Leg] Wound Location: [10:Blister] [13:Blister] [14:Blister] Wounding Event: [10:Diabetic Wound/Ulcer of the Lower] [13:Diabetic Wound/Ulcer of the Lower] [14:Diabetic Wound/Ulcer of the Lower] Primary Etiology: [10:Extremity Arterial Insufficiency Ulcer] [13:Extremity N/Kathleen] [14:Extremity N/Kathleen] Secondary Etiology: [10:Cataracts, Glaucoma, Anemia,] [13:N/Kathleen] [14:N/Kathleen] Comorbid History: [10:Asthma, Congestive Heart Failure, Coronary Artery Disease, Hypertension, Peripheral  Arterial Disease, Type II Diabetes, Osteoarthritis 07/14/2020] [13:10/23/2020] [14:10/23/2020] Date Acquired: [10:6] [13:0] [14:0] Weeks of Treatment: [10:Healed - Epithelialized] [13:Open] [14:Open] Wound Status: [10:0x0x0] [13:9x5.5x0.1] [14:3.7x2.2x0.1] Measurements L x W x D (cm) [10:0] [13:38.877] [14:6.393] Kathleen (cm) : rea [10:0] [13:3.888] [14:0.639] Volume (cm) : [10:100.00%] [13:N/Kathleen] [14:N/Kathleen] % Reduction in Area: [10:100.00%] [13:N/Kathleen] [14:N/Kathleen] % Reduction in Volume: [10:Grade 2] [13:N/Kathleen] [14:N/Kathleen] Wound Number: 15 9 N/Kathleen Photos: No Photos No Photos N/Kathleen Left, Posterior Lower Leg Right Calcaneus N/Kathleen Wound Location: Gradually Appeared Blister N/Kathleen Wounding Event: Diabetic Wound/Ulcer of the Lower Diabetic Wound/Ulcer of the Lower N/Kathleen Primary Etiology: Extremity Extremity N/Kathleen N/Kathleen N/Kathleen Secondary Etiology: N/Kathleen Cataracts, Glaucoma, Anemia, N/Kathleen Comorbid History: Asthma, Congestive Heart Failure, Coronary Artery Disease, Hypertension, Peripheral Arterial Disease, Type II Diabetes, Osteoarthritis 10/23/2020 07/14/2020 N/Kathleen Date Acquired: 0 6 N/Kathleen Weeks of Treatment: Open Healed - Epithelialized N/Kathleen Wound Status: 0.4x0.3x0.2 0x0x0 N/Kathleen Measurements L x W x D (cm) 0.094 0 N/Kathleen Kathleen (cm) : rea 0.019 0  N/Kathleen Volume (cm) : N/Kathleen 100.00% N/Kathleen % Reduction in Area: N/Kathleen 100.00% N/Kathleen % Reduction in Volume: N/Kathleen Grade 2 N/Kathleen Classification: Treatment Notes Electronic Signature(s) Signed: 10/30/2020 6:16:52 PM By: Kathleen [name] Signed: 11/02/2020 9:50:14 AM By: Linton Ham MD Entered By: Linton Ham on 10/30/2020 09:34:26 -------------------------------------------------------------------------------- Multi-Disciplinary Care Plan Details Patient Name: Date of Service: Kathleen [name]. 10/30/2020 8:15 Kathleen [name] Medical Record Number: [id number] Patient Account Number: [id number] Date of Birth/Sex: Treating RN: Feb 23, 1946 (75 y.o. Kathleen [name] Primary Care Jalexis Breed: Kathleen [name] Other Clinician: Referring Dillyn Menna: Treating Elihue Ebert/Extender: Kathleen [name] in Treatment: 6 Multidisciplinary Care Plan reviewed with physician Active Inactive Abuse / Safety / Falls / Self Care Management Nursing Diagnoses: Potential for falls Potential for injury related to falls Goals: Patient will not experience any injury related to falls Date Initiated: 09/14/2020 Target Resolution Date: 11/20/2020 Goal Status: Active Patient/caregiver will verbalize/demonstrate measures taken to prevent injury and/or falls Date Initiated: 09/14/2020 Target Resolution Date: 11/20/2020 Goal Status: Active Interventions: Assess Activities of Daily Living upon admission and as needed Assess fall risk on admission and as needed Assess: immobility, friction, shearing, incontinence upon admission and as needed Assess impairment of mobility on admission and as needed per policy Assess personal safety and home safety (as indicated) on admission and as needed Assess self care needs on admission and as needed Provide education on fall prevention Provide education on personal and home safety Notes: Nutrition Nursing Diagnoses: Impaired glucose control: actual or potential Potential for  alteratiion in Nutrition/Potential for imbalanced nutrition Goals: Patient/caregiver agrees to and verbalizes understanding of need to use nutritional supplements and/or vitamins as prescribed Date Initiated: 09/14/2020 Target Resolution Date: 11/20/2020 Goal Status: Active Patient/caregiver will maintain therapeutic glucose control Date Initiated: 09/14/2020 Target Resolution Date: 11/20/2020 Goal Status: Active Interventions: Assess HgA1c results as ordered upon admission and as needed Assess patient nutrition upon admission and as needed per policy Provide education on elevated blood sugars and impact on wound healing Provide education on nutrition Treatment Activities: Education provided on Nutrition : 09/14/2020 Notes: Venous Leg Ulcer Nursing Diagnoses: Knowledge deficit related to disease process and management Goals: Patient/caregiver will verbalize understanding of disease process and disease management Date Initiated: 09/14/2020 Target Resolution Date: 11/20/2020 Goal Status: Active Interventions: Assess peripheral edema status every visit. Notes: Wound/Skin Impairment Nursing Diagnoses: Impaired tissue integrity Knowledge deficit related to ulceration/compromised skin integrity Goals: Patient/caregiver will verbalize understanding of skin care regimen Date Initiated: 09/14/2020 Target Resolution Date: 11/20/2020 Goal Status:  Active Interventions: Assess patient/caregiver ability to obtain necessary supplies Assess patient/caregiver ability to perform ulcer/skin care regimen upon admission and as needed Assess ulceration(s) every visit Provide education on ulcer and skin care Notes: Electronic Signature(s) Signed: 10/30/2020 8:33:05 AM By: Kathleen [name] Entered By: Kathleen [name] on 10/30/2020 08:33:04 -------------------------------------------------------------------------------- Pain Assessment Details Patient Name: Date of Service: Kathleen [name]. 10/30/2020  8:15 Kathleen [name] Medical Record Number: [id number] Patient Account Number: [id number] Date of Birth/Sex: Treating RN: Jan 10, 1946 (75 y.o. Kathleen [name] Primary Care Jarvin Ogren: Kathleen [name] Other Clinician: Referring Sven Pinheiro: Treating Rolan Wrightsman/Extender: Kathleen [name] in Treatment: 6 Active Problems Location of Pain Severity and Description of Pain Patient Has Paino No Site Locations Pain Management and Medication Current Pain Management: Electronic Signature(s) Signed: 10/30/2020 6:16:52 PM By: Kathleen [name] Signed: 11/04/2020 9:05:34 AM By: Kathleen [name] Entered By: Kathleen [name] on 10/30/2020 08:28:50 -------------------------------------------------------------------------------- Patient/Caregiver Education Details Patient Name: Date of Service: Kathleen [name]. 3/25/2022andnbsp8:15 Kathleen [name] Medical Record Number: [id number] Patient Account Number: [id number] Date of Birth/Gender: Treating RN: 03-17-1946 (75 y.o. Kathleen [name] Primary Care Physician: Kathleen [name] Other Clinician: Referring Physician: Treating Physician/Extender: Kathleen [name] in Treatment: 6 Education Assessment Education Provided To: Patient Education Topics Provided Offloading: Methods: Explain/Verbal, Printed Responses: State content correctly Wound/Skin Impairment: Methods: Demonstration, Explain/Verbal, Printed Responses: State content correctly Electronic Signature(s) Signed: 10/30/2020 6:16:52 PM By: Kathleen [name] Entered By: Kathleen [name] on 10/30/2020 08:43:14 -------------------------------------------------------------------------------- Wound Assessment Details Patient Name: Date of Service: Kathleen [name]. 10/30/2020 8:15 Kathleen [name] Medical Record Number: [id number] Patient Account Number: [id number] Date of Birth/Sex: Treating RN: Dec 06, 1945 (75 y.o. Kathleen [name] Primary Care Aryan Sparks: Kathleen [name] Other  Clinician: Referring Holly Pring: Treating Besnik Febus/Extender: Kathleen [name] in Treatment: 6 Wound Status Wound Number: 10 Primary Diabetic Wound/Ulcer of the Lower Extremity Etiology: Wound Location: Left T Great oe Secondary Arterial Insufficiency Ulcer Wounding Event: Blister Etiology: Date Acquired: 07/14/2020 Wound Healed - Epithelialized Weeks Of Treatment: 6 Status: Clustered Wound: No Comorbid Cataracts, Glaucoma, Anemia, Asthma, Congestive Heart Failure, History: Coronary Artery Disease, Hypertension, Peripheral Arterial Disease, Type II Diabetes, Osteoarthritis Wound Measurements Length: (cm) Width: (cm) Depth: (cm) Area: (cm) Volume: (cm) 0 % Reduction in Area: 100% 0 % Reduction in Volume: 100% 0 0 0 Wound Description Classification: Grade 2 Electronic Signature(s) Signed: 10/30/2020 6:16:52 PM By: Kathleen [name] Entered By: Kathleen [name] on 10/30/2020 09:15:46 -------------------------------------------------------------------------------- Wound Assessment Details Patient Name: Date of Service: Kathleen [name]. 10/30/2020 8:15 Kathleen [name] Medical Record Number: [id number] Patient Account Number: [id number] Date of Birth/Sex: Treating RN: 12/19/1945 (75 y.o. Kathleen [name] Primary Care Derreck Wiltsey: Kathleen [name] Other Clinician: Referring Maynor Mwangi: Treating Ara Grandmaison/Extender: Kathleen [name] in Treatment: 6 Wound Status Wound Number: 13 Primary Diabetic Wound/Ulcer of the Lower Extremity Etiology: Wound Location: Left, Anterior Lower Leg Wound Open Wounding Event: Blister Status: Date Acquired: 10/23/2020 Comorbid Cataracts, Glaucoma, Anemia, Asthma, Congestive Heart Failure, Weeks Of Treatment: 0 History: Coronary Artery Disease, Hypertension, Peripheral Arterial Disease, Clustered Wound: No Type II Diabetes, Osteoarthritis Photos Wound Measurements Length: (cm) 9 Width: (cm) 5.5 Depth: (cm)  0.1 Area: (cm) 38.877 Volume: (cm) 3.888 % Reduction in Area: 0% % Reduction in Volume: 0% Wound Description Classification: Grade 0 Treatment Notes Wound #13 (Lower Leg) Wound Laterality: Left, Anterior Cleanser Soap and Water Discharge Instruction: May shower and wash wound with dial antibacterial soap and water prior to dressing change. Peri-Wound Care Triamcinolone 15 (g) Discharge  Instruction: Apply liberally to left leg Topical Primary Dressing KerraCel Ag Gelling Fiber Dressing, 4x5 in (silver alginate) Discharge Instruction: Apply silver alginate to wound bed as instructed Secondary Dressing Woven Gauze Sponge, Non-Sterile 4x4 in Discharge Instruction: Apply over primary dressing as directed. ABD Pad, 8x10 Discharge Instruction: Apply over primary dressing as directed. Secured With Compression Wrap Kerlix Roll 4.5x3.1 (in/yd) Discharge Instruction: Apply Kerlix and Coban compression as directed. Coban Self-Adherent Wrap 4x5 (in/yd) Discharge Instruction: Apply over Kerlix as directed. Compression Stockings Add-Ons Electronic Signature(s) Signed: 10/30/2020 6:16:52 PM By: Kathleen [name] Signed: 11/04/2020 9:05:34 AM By: Kathleen [name] Entered By: Kathleen [name] on 10/30/2020 16:54:37 -------------------------------------------------------------------------------- Wound Assessment Details Patient Name: Date of Service: Kathleen [name]. 10/30/2020 8:15 Kathleen [name] Medical Record Number: [id number] Patient Account Number: [id number] Date of Birth/Sex: Treating RN: 04/11/1946 (75 y.o. Kathleen [name] Primary Care Reed Eifert: Kathleen [name] Other Clinician: Referring Meriah Shands: Treating Addysen Louth/Extender: Kathleen [name] in Treatment: 6 Wound Status Wound Number: 14 Primary Diabetic Wound/Ulcer of the Lower Extremity Etiology: Wound Location: Right, Lateral Lower Leg Wound Open Wounding Event: Blister Status: Date Acquired:  10/23/2020 Comorbid Cataracts, Glaucoma, Anemia, Asthma, Congestive Heart Failure, Weeks Of Treatment: 0 History: Coronary Artery Disease, Hypertension, Peripheral Arterial Disease, Clustered Wound: No Type II Diabetes, Osteoarthritis Photos Wound Measurements Length: (cm) 3.7 Width: (cm) 2.2 Depth: (cm) 0.1 Area: (cm) 6.393 Volume: (cm) 0.639 % Reduction in Area: 0% % Reduction in Volume: 0% Wound Description Classification: Grade 0 Treatment Notes Wound #14 (Lower Leg) Wound Laterality: Right, Lateral Cleanser Soap and Water Discharge Instruction: May shower and wash wound with dial antibacterial soap and water prior to dressing change. Peri-Wound Care Triamcinolone 15 (g) Discharge Instruction: Apply liberally to left leg Topical Primary Dressing KerraCel Ag Gelling Fiber Dressing, 4x5 in (silver alginate) Discharge Instruction: Apply silver alginate to wound bed as instructed Secondary Dressing Woven Gauze Sponge, Non-Sterile 4x4 in Discharge Instruction: Apply over primary dressing as directed. ABD Pad, 8x10 Discharge Instruction: Apply over primary dressing as directed. Secured With Compression Wrap Kerlix Roll 4.5x3.1 (in/yd) Discharge Instruction: Apply Kerlix and Coban compression as directed. Coban Self-Adherent Wrap 4x5 (in/yd) Discharge Instruction: Apply over Kerlix as directed. Compression Stockings Add-Ons Electronic Signature(s) Signed: 10/30/2020 6:16:52 PM By: Kathleen [name] Signed: 11/04/2020 9:05:34 AM By: Kathleen [name] Entered By: Kathleen [name] on 10/30/2020 16:56:52 -------------------------------------------------------------------------------- Wound Assessment Details Patient Name: Date of Service: Kathleen [name]. 10/30/2020 8:15 Kathleen [name] Medical Record Number: [id number] Patient Account Number: [id number] Date of Birth/Sex: Treating RN: Apr 28, 1946 (75 y.o. Kathleen [name] Primary Care Jarquavious Fentress: Kathleen [name] Other  Clinician: Referring Courtany Mcmurphy: Treating Eboni Coval/Extender: Kathleen [name] in Treatment: 6 Wound Status Wound Number: 15 Primary Diabetic Wound/Ulcer of the Lower Extremity Etiology: Wound Location: Left, Posterior Lower Leg Wound Open Wounding Event: Gradually Appeared Status: Date Acquired: 10/23/2020 Comorbid Cataracts, Glaucoma, Anemia, Asthma, Congestive Heart Failure, Weeks Of Treatment: 0 History: Coronary Artery Disease, Hypertension, Peripheral Arterial Disease, Clustered Wound: No Type II Diabetes, Osteoarthritis Photos Wound Measurements Length: (cm) 0.4 Width: (cm) 0.3 Depth: (cm) 0.2 Area: (cm) 0.094 Volume: (cm) 0.019 % Reduction in Area: 0% % Reduction in Volume: 0% Wound Description Classification: Grade 1 Treatment Notes Wound #15 (Lower Leg) Wound Laterality: Left, Posterior Cleanser Soap and Water Discharge Instruction: May shower and wash wound with dial antibacterial soap and water prior to dressing change. Peri-Wound Care Triamcinolone 15 (g) Discharge Instruction: Apply liberally to left leg Topical Primary Dressing KerraCel Ag Gelling Fiber Dressing, 4x5  in (silver alginate) Discharge Instruction: Apply silver alginate to wound bed as instructed Secondary Dressing Woven Gauze Sponge, Non-Sterile 4x4 in Discharge Instruction: Apply over primary dressing as directed. ABD Pad, 8x10 Discharge Instruction: Apply over primary dressing as directed. Secured With Compression Wrap Kerlix Roll 4.5x3.1 (in/yd) Discharge Instruction: Apply Kerlix and Coban compression as directed. Coban Self-Adherent Wrap 4x5 (in/yd) Discharge Instruction: Apply over Kerlix as directed. Compression Stockings Add-Ons Electronic Signature(s) Signed: 10/30/2020 6:16:52 PM By: Kathleen [name] Signed: 11/04/2020 9:05:34 AM By: Kathleen [name] Entered By: Kathleen [name] on 10/30/2020  16:58:11 -------------------------------------------------------------------------------- Wound Assessment Details Patient Name: Date of Service: Kathleen [name]. 10/30/2020 8:15 Kathleen [name] Medical Record Number: [id number] Patient Account Number: [id number] Date of Birth/Sex: Treating RN: 03-Oct-1945 (75 y.o. Kathleen [name] Primary Care Khole Arterburn: Kathleen [name] Other Clinician: Referring Adeyemi Hamad: Treating Meggan Dhaliwal/Extender: Kathleen [name] in Treatment: 6 Wound Status Wound Number: 9 Primary Diabetic Wound/Ulcer of the Lower Extremity Etiology: Wound Location: Right Calcaneus Wound Healed - Epithelialized Wounding Event: Blister Status: Date Acquired: 07/14/2020 Comorbid Cataracts, Glaucoma, Anemia, Asthma, Congestive Heart Failure, Weeks Of Treatment: 6 History: Coronary Artery Disease, Hypertension, Peripheral Arterial Disease, Clustered Wound: No Type II Diabetes, Osteoarthritis Wound Measurements Length: (cm) Width: (cm) Depth: (cm) Area: (cm) Volume: (cm) 0 % Reduction in Area: 100% 0 % Reduction in Volume: 100% 0 0 0 Wound Description Classification: Grade 2 Electronic Signature(s) Signed: 10/30/2020 6:16:52 PM By: Kathleen [name] Entered By: Kathleen [name] on 10/30/2020 09:18:05 -------------------------------------------------------------------------------- Vitals Details Patient Name: Date of Service: Kathleen [name], Kathleen Kathleen [name]. 10/30/2020 8:15 Kathleen [name] Medical Record Number: [id number] Patient Account Number: [id number] Date of Birth/Sex: Treating RN: Dec 03, 1945 (75 y.o. Kathleen [name] Primary Care Suan Pyeatt: Kathleen [name] Other Clinician: Referring Zohal Reny: Treating Mckinzie Saksa/Extender: Kathleen [name] in Treatment: 6 Vital Signs Time Taken: 08:28 Temperature (F): 97.8 Height (in): 63 Pulse (bpm): 85 Weight (lbs): 187 Respiratory Rate (breaths/min): 17 Body Mass Index (BMI): 33.1 Blood Pressure  (mmHg): 142/78 Reference Range: 80 - 120 mg / dl Electronic Signature(s) Signed: 11/04/2020 9:05:34 AM By: Kathleen [name] Entered By: Kathleen [name] on 10/30/2020 08:28:39

## 2020-11-05 DIAGNOSIS — Z992 Dependence on renal dialysis: Secondary | ICD-10-CM | POA: Diagnosis not present

## 2020-11-05 DIAGNOSIS — N186 End stage renal disease: Secondary | ICD-10-CM | POA: Diagnosis not present

## 2020-11-05 DIAGNOSIS — E1122 Type 2 diabetes mellitus with diabetic chronic kidney disease: Secondary | ICD-10-CM | POA: Diagnosis not present

## 2020-11-06 ENCOUNTER — Encounter (HOSPITAL_BASED_OUTPATIENT_CLINIC_OR_DEPARTMENT_OTHER): Payer: Medicare HMO | Attending: Internal Medicine | Admitting: Internal Medicine

## 2020-11-06 ENCOUNTER — Other Ambulatory Visit: Payer: Self-pay

## 2020-11-06 DIAGNOSIS — I129 Hypertensive chronic kidney disease with stage 1 through stage 4 chronic kidney disease, or unspecified chronic kidney disease: Secondary | ICD-10-CM | POA: Insufficient documentation

## 2020-11-06 DIAGNOSIS — L97829 Non-pressure chronic ulcer of other part of left lower leg with unspecified severity: Secondary | ICD-10-CM | POA: Diagnosis present

## 2020-11-06 DIAGNOSIS — E11621 Type 2 diabetes mellitus with foot ulcer: Secondary | ICD-10-CM | POA: Insufficient documentation

## 2020-11-06 DIAGNOSIS — L97822 Non-pressure chronic ulcer of other part of left lower leg with fat layer exposed: Secondary | ICD-10-CM | POA: Insufficient documentation

## 2020-11-06 DIAGNOSIS — I87323 Chronic venous hypertension (idiopathic) with inflammation of bilateral lower extremity: Secondary | ICD-10-CM | POA: Insufficient documentation

## 2020-11-06 DIAGNOSIS — E11622 Type 2 diabetes mellitus with other skin ulcer: Secondary | ICD-10-CM | POA: Insufficient documentation

## 2020-11-06 DIAGNOSIS — I87312 Chronic venous hypertension (idiopathic) with ulcer of left lower extremity: Secondary | ICD-10-CM | POA: Diagnosis not present

## 2020-11-06 DIAGNOSIS — E1142 Type 2 diabetes mellitus with diabetic polyneuropathy: Secondary | ICD-10-CM | POA: Insufficient documentation

## 2020-11-06 DIAGNOSIS — N184 Chronic kidney disease, stage 4 (severe): Secondary | ICD-10-CM | POA: Diagnosis not present

## 2020-11-06 DIAGNOSIS — L97521 Non-pressure chronic ulcer of other part of left foot limited to breakdown of skin: Secondary | ICD-10-CM | POA: Diagnosis not present

## 2020-11-06 DIAGNOSIS — Z992 Dependence on renal dialysis: Secondary | ICD-10-CM | POA: Diagnosis not present

## 2020-11-06 DIAGNOSIS — E1151 Type 2 diabetes mellitus with diabetic peripheral angiopathy without gangrene: Secondary | ICD-10-CM | POA: Insufficient documentation

## 2020-11-06 DIAGNOSIS — E1122 Type 2 diabetes mellitus with diabetic chronic kidney disease: Secondary | ICD-10-CM | POA: Diagnosis not present

## 2020-11-06 DIAGNOSIS — L97411 Non-pressure chronic ulcer of right heel and midfoot limited to breakdown of skin: Secondary | ICD-10-CM | POA: Diagnosis not present

## 2020-11-06 DIAGNOSIS — Z794 Long term (current) use of insulin: Secondary | ICD-10-CM | POA: Insufficient documentation

## 2020-11-06 DIAGNOSIS — L97222 Non-pressure chronic ulcer of left calf with fat layer exposed: Secondary | ICD-10-CM | POA: Diagnosis not present

## 2020-11-06 NOTE — Progress Notes (Signed)
TREASE, BREMNER ([id number]) Visit Report for 11/06/2020 HPI Details Patient Name: Date of Service: Kathleen [name] RES M. 11/06/2020 2:45 PM Medical Record Number: [id number] Patient Account Number: [id number] Date of Birth/Sex: Treating RN: 1946-08-02 (75 y.o. Sue Lush Primary Care Provider: Benito Mccreedy Other Clinician: Referring Provider: Treating Provider/Extender: Loma Sender in Treatment: 7 History of Present Illness HPI Description: 05/14/15; this is a 75 year old woman with a history of diabetes who was referred here I think after being seen in the emergency room on 9/29 with sudden edema of the right leg and a large resultant blister on the posterior medial part of the right leg. She had a previous blister about a week before her arrival there however this it open by the time she came to the emergency room. She has no history of known PAD, prior wound. She does have a history of a cardiomyopathy with her last echo on 10/15 showing an EF of 50-55%. She was given clindamycin orally and she is about to complete these in 2 days. She was felt to have cellulitis of the right lower extremity. 05/22/15; her duplex ultrasound was negative for DVT The Kerlix Coban appears to be controlling her edema. The substantial wound area on the right posterior . medial leg looks to be improved with epithelialization. She still has a reasonably substantial open area however I think this is going to heal there is no evidence of infection. Her peripheral pulses are not palpable in this foot. I think this lady probably has some degree of PAD as well. interestingly, she comes in with a crop of blisters just below her knee. These are itchy and if they have been more dermatomal I might of considered zoster. However I think this is probably some form of contact dermatitis question Coban oo 05/28/15. The area on the medial aspect of her right leg continues to be improved in  terms of advancing epithelialization. There is no evidence of infection. She probably has some degree of PAD. The contact dermatitis last blisters in the top of her right leg from last week are resolved. Finally she on the left leg she has a large blister which I opened this was flaccid she has edema in the left leg and I think this is the cause 06/05/15. Her right leg wound is completely epithelialized. There is no open wounds on her right leg. I had to remove the remaining skin from the blister on her left leg however this looks superficial and should be resolved next week. It also looks like she has some friction injury on the top of her right anterior first metatarsal head. I've advised to protect this in her shoes 06/11/15; she has now to open areas one posteriorly on the left leg and one anteriorly on the left. Still poorly controlled edema and may be developing a small additional blister on the right leg. None of this looks to have infection.she is obtained juxtalite stockings at home but she did not bring them today 06/19/15; patient returns today all of her wounds are closed. She has significant venous insufficiency with inflammation right greater than left considerable hemosiderin deposition on the right. We are able to discharge her in her juxtalite stockings. I gave her instructions for lubricating the skin her lower legs twice a day READMISSION; 02/05/16; this is a patient we had in this clinic from 10/7 through 11/4 2016. At that point I felt she had coexistent venous insufficiency and edema and PAD. She presented  with a blister and cellulitis on the medial right leg which ultimately form the wound which progressively epithelialized over several weeks and then resolved. During her stay here she does not seem to pad noninvasive arterial studies. She also developed large flaccid blisters on her left leg that formed wounds as well. We ultimately discharged. Juxtalite stockings to control the  swelling. The patient is a type II diabetic on insulin. He would appear that she has fairly significant chronic renal failure with the last creatinine on 4/22,017 at 3.36. Estimated GFR of 13. Last hemoglobin A1c of 9.0 in April 2017 the patient was seen by her primary physician Dr. Iona Beard Osei-Bonsu. He noted what sounds like cellulitis on the posterior left leg and a clean 6 cm x 5 cm ulcer with surrounding skin redness. She was put on antibiotics. The patient states she has not had any wounds on her legs since she was here in the fall. I'm not exactly sure how she treated the ulcer in her primary physician's office however she arrived with no ulcer on the left leg only eschar that peeled off to reveal normal skin. It is fairly clear that the patient is not wearing her juxtalite stockings. She is not describing shortness breath or chest pain but does describe heaviness in her legs when she walks forcing her to stop and rest. In this clinic her ABI on the right was 0.65 and on the left 0.54. 02/19/16; the patient arrived with a small hemorrhagic blister on the left anterior leg. There is nothing on the right. She's been to see Dr. Gwenlyn Found the formal studies showed an ABI in the right of 0.64 on the left of 0.59. TBI in the right 0.48 0.49 on the left. She had bilateral common femoral artery waves were triphasic. Bilateral popliteal peroneal anterior and posterior tibial arteries were monophasic. She has a follow-up with Dr. Gwenlyn Found next week 02/26/16; left leg blisters healed however she has a right leg blister this was excised with pickups and scissors. She's been to see Dr. Gwenlyn Found who notes her chronic renal failure, the reasonably unimpressive nature of her wounds and did not want to proceed with angiography. He will see her in 3 months 03/04/16; left leg blisters have healed however she did not come in with her juxtalite stockings. She has been to see Dr. Gwenlyn Found did not want to proceed with angiography.  Follow-up in 3 months. She developed a blister on the right last week we excised it. Dimensions are better 09/29/16- the patient made an appointment for a large blister on the anterior aspect of her right lower extremity since making the appointment the blister deroofed and has completely healed and epithelialized. She has not been using her juxta lites that were prescribed nor has she been wearing compression stockings. READMISSION 09/14/2020 This is a patient that we had in the clinic in 2016 and 17. She was last here in February 2018. These were largely for what was felt to be chronic venous insufficiency wounds on her bilateral lower extremities. At one point she had bilateral juxta lite stockings. She was also noted to have diabetic angiopathy with PAD. She went to see Dr. Gwenlyn Found but because the wounds were predominantly venous and she was in stage IV chronic renal failure nothing further was done about her PAD. She comes back in clinic today with wounds on her right heel, right first toe, left first toe and 3 wounds on the left anterior lower extremity. She is complaining of a  lot of pain in the left foot, right heel. She is not a smoker. Past medical history includes coronary artery disease with ischemic cardiomyopathy, type 2 diabetes, coronary artery disease, osteoarthritis, hypertension, chronic renal failure now on dialysis, hyperlipidemia, asthma ABIs in our clinic today were 0.5 on the right and 0.55 on the left 2/14; we do not have an appointment for vascular studies and we continue to work on this. She has areas on her left anterior lower leg which look mostly like venous wounds. She has areas on the right first toe tips right fourth toe tip and right heel. She is complaining a lot of pain however it is difficult to really determine how much of the pain she experiences is secondary to claudication. We wrapped both legs last time however it looks like she has wrap injury on the upper  medial right leg with blistering and excoriations. 2/21; arterial studies are on the 23rd with Dr. Kennon Holter office. The patient's areas on the left anterior lower leg which are mostly venous wounds are closed. On the right the tip of her first toe and fifth toe have eschar I did not remove this. There is still an open area on the right heel were using silver alginate 10/09/2020 upon evaluation today patient appears to be doing well with regard to her ulcerations. This is on the right heel and left great toe. With that being said she unfortunately does not have good blood flow in either extremity. In fact I did review her Doppler study and it appears that on the right she had an ABI of 0.75 which was dampened and a TBI of 0.16. On the left she had an ABI of 0.55 and absent signals in the toes we could not even get a TBI. Obviously this is even a bit worse than her previous evaluation. This indicates at least moderate right lower extremity arterial disease and moderate left lower extremity arterial disease. With that being said interestingly and in a good way the patient is actually showing some signs of improvement nonetheless with regard to her wounds. Fortunately there is no evidence of active infection at this time which is great news. No fevers, chills, nausea, vomiting, or diarrhea. 10/30/2020; this is a patient I have not seen in over a month but she was seen here 3 weeks ago. We have been following her for wounds on her right heel and left great toe paradoxically both of these are healed today. However she arrives in clinic today with swelling in her bilateral lower legs 3+ pitting edema and 3 wound areas that started as blisters on the left anterior left posterior and left lateral lower leg. These all look like basis of previously denuded blisters. Also worrisome is she has on the medial upper calf a crop of small vesicular blisters which are quite extensive. These are not painful or itchy. I am not  exactly sure what is causing this. She dialyzes at the [facility] clinic on [address] Tuesday Thursday Saturday I had previously received a phone call 2 or 3 weeks ago from Dr. Gwenlyn Found although the patient has known PAD and did not have vibrant ABIs on the left an ABI of 0.55 and a TBI of 0.16 at the time she did not have open wounds and Dr. Gwenlyn Found did not feel he needed to do any interventions 4/1; this is a patient with chronic venous insufficiency as well as significant arterial insufficiency. We saw her last week with blisters denuded are largely on  the left anterior left lateral and left posterior lower leg. We put her in kerlix Coban as we really cannot wrap this more aggressively. She does not have compression stockings. We have been using silver alginate as the primary dressing Electronic Signature(s) Signed: 11/06/2020 5:01:47 PM By: Linton Ham MD Entered By: Linton Ham on 11/06/2020 16:49:08 -------------------------------------------------------------------------------- Physical Exam Details Patient Name: Date of Service: Kathleen [name] RES M. 11/06/2020 2:45 PM Medical Record Number: [id number] Patient Account Number: [id number] Date of Birth/Sex: Treating RN: 1945/12/30 (75 y.o. Sue Lush Primary Care Provider: Benito Mccreedy Other Clinician: Referring Provider: Treating Provider/Extender: Loma Sender in Treatment: 7 Constitutional Patient is hypertensive.. Pulse regular and within target range for patient.[name] Respirations regular, non-labored and within target range.. Temperature is normal and within the target range for the patient.[name] Appears in no distress. Cardiovascular Pedal pulses are palpable. Integumentary (Hair, Skin) Given changes of chronic venous insufficiency and probable lymphedema. Notes Wound exam; the patient has areas on the left anterior left lateral and left posterior legs the remanence of fairly large denuded  blisters. The area on posterior is just about closed the anterior slightly healthier as well as the lateral. No debridement was required Electronic Signature(s) Signed: 11/06/2020 5:01:47 PM By: Linton Ham MD Entered By: Linton Ham on 11/06/2020 16:50:10 -------------------------------------------------------------------------------- Physician Orders Details Patient Name: Date of Service: Kathleen [name] RES M. 11/06/2020 2:45 PM Medical Record Number: [id number] Patient Account Number: [id number] Date of Birth/Sex: Treating RN: 09-28-1945 (75 y.o. Sue Lush Primary Care Provider: Benito Mccreedy Other Clinician: Referring Provider: Treating Provider/Extender: Loma Sender in Treatment: 7 Verbal / Phone Orders: No Diagnosis Coding Follow-up Appointments Return Appointment in 1 week. Bathing/ Shower/ Hygiene May shower with protection but do not get wound dressing(s) wet. Edema Control - Lymphedema / SCD / Other Elevate legs to the level of the heart or above for 30 minutes daily and/or when sitting, a frequency of: - throughout the day Avoid standing for long periods of time. Patient to wear own compression stockings every day. - Wear compression stockings on right leg: apply in morning and remove at bedtime. Exercise regularly Moisturize legs daily. - right leg and foot daily Additional Orders / Instructions Follow Nutritious Diet Wound Treatment Wound #13 - Lower Leg Wound Laterality: Left, Anterior, Proximal Cleanser: Soap and Water 1 x Per Week/15 Days Discharge Instructions: May shower and wash wound with dial antibacterial soap and water prior to dressing change. Peri-Wound Care: Triamcinolone 15 (g) 1 x Per Week/15 Days Discharge Instructions: Apply liberally to left leg Prim Dressing: KerraCel Ag Gelling Fiber Dressing, 4x5 in (silver alginate) 1 x Per Week/15 Days ary Discharge Instructions: Apply silver alginate to wound bed as  instructed Secondary Dressing: Woven Gauze Sponge, Non-Sterile 4x4 in 1 x Per Week/15 Days Discharge Instructions: Apply over primary dressing as directed. Secondary Dressing: ABD Pad, 8x10 1 x Per Week/15 Days Discharge Instructions: Apply over primary dressing as directed. Compression Wrap: Kerlix Roll 4.5x3.1 (in/yd) 1 x Per Week/15 Days Discharge Instructions: Apply Kerlix and Coban compression as directed. Compression Wrap: Coban Self-Adherent Wrap 4x5 (in/yd) 1 x Per Week/15 Days Discharge Instructions: Apply over Kerlix as directed. Wound #14 - Lower Leg Wound Laterality: Left, Lateral Cleanser: Soap and Water 1 x Per Week/15 Days Discharge Instructions: May shower and wash wound with dial antibacterial soap and water prior to dressing change. Peri-Wound Care: Triamcinolone 15 (g) 1 x Per Week/15 Days Discharge Instructions: Apply liberally to left  leg Prim Dressing: KerraCel Ag Gelling Fiber Dressing, 4x5 in (silver alginate) 1 x Per Week/15 Days ary Discharge Instructions: Apply silver alginate to wound bed as instructed Secondary Dressing: Woven Gauze Sponge, Non-Sterile 4x4 in 1 x Per Week/15 Days Discharge Instructions: Apply over primary dressing as directed. Secondary Dressing: ABD Pad, 8x10 1 x Per Week/15 Days Discharge Instructions: Apply over primary dressing as directed. Compression Wrap: Kerlix Roll 4.5x3.1 (in/yd) 1 x Per Week/15 Days Discharge Instructions: Apply Kerlix and Coban compression as directed. Compression Wrap: Coban Self-Adherent Wrap 4x5 (in/yd) 1 x Per Week/15 Days Discharge Instructions: Apply over Kerlix as directed. Wound #15 - Lower Leg Wound Laterality: Left, Posterior Cleanser: Soap and Water 1 x Per Week/15 Days Discharge Instructions: May shower and wash wound with dial antibacterial soap and water prior to dressing change. Peri-Wound Care: Triamcinolone 15 (g) 1 x Per Week/15 Days Discharge Instructions: Apply liberally to left leg Prim  Dressing: KerraCel Ag Gelling Fiber Dressing, 4x5 in (silver alginate) 1 x Per Week/15 Days ary Discharge Instructions: Apply silver alginate to wound bed as instructed Secondary Dressing: Woven Gauze Sponge, Non-Sterile 4x4 in 1 x Per Week/15 Days Discharge Instructions: Apply over primary dressing as directed. Secondary Dressing: ABD Pad, 8x10 1 x Per Week/15 Days Discharge Instructions: Apply over primary dressing as directed. Compression Wrap: Kerlix Roll 4.5x3.1 (in/yd) 1 x Per Week/15 Days Discharge Instructions: Apply Kerlix and Coban compression as directed. Compression Wrap: Coban Self-Adherent Wrap 4x5 (in/yd) 1 x Per Week/15 Days Discharge Instructions: Apply over Kerlix as directed. Electronic Signature(s) Signed: 11/06/2020 5:01:47 PM By: Linton Ham MD Signed: 11/06/2020 5:18:29 PM By: Lorrin Jackson Previous Signature: 11/06/2020 3:55:43 PM Version By: Lorrin Jackson Entered By: Lorrin Jackson on 11/06/2020 16:03:53 -------------------------------------------------------------------------------- Problem List Details Patient Name: Date of Service: Kathleen [name] RES M. 11/06/2020 2:45 PM Medical Record Number: [id number] Patient Account Number: [id number] Date of Birth/Sex: Treating RN: 02-08-1946 (75 y.o. Sue Lush Primary Care Provider: Benito Mccreedy Other Clinician: Referring Provider: Treating Provider/Extender: Loma Sender in Treatment: 7 Active Problems ICD-10 Encounter Code Description Active Date MDM Diagnosis E11.51 Type 2 diabetes mellitus with diabetic peripheral angiopathy without gangrene 09/14/2020 No Yes I87.323 Chronic venous hypertension (idiopathic) with inflammation of bilateral lower 09/14/2020 No Yes extremity L97.822 Non-pressure chronic ulcer of other part of left lower leg with fat layer exposed2/02/2021 No Yes E11.42 Type 2 diabetes mellitus with diabetic polyneuropathy 09/14/2020 No Yes Inactive  Problems ICD-10 Code Description Active Date Inactive Date L97.411 Non-pressure chronic ulcer of right heel and midfoot limited to breakdown of skin 09/14/2020 09/14/2020 L97.521 Non-pressure chronic ulcer of other part of left foot limited to breakdown of skin 09/14/2020 09/14/2020 Resolved Problems Electronic Signature(s) Signed: 11/06/2020 5:01:47 PM By: Linton Ham MD Entered By: Linton Ham on 11/06/2020 16:48:04 -------------------------------------------------------------------------------- Progress Note Details Patient Name: Date of Service: Kathleen [name] RES M. 11/06/2020 2:45 PM Medical Record Number: [id number] Patient Account Number: [id number] Date of Birth/Sex: Treating RN: 1945-11-10 (75 y.o. Sue Lush Primary Care Provider: Benito Mccreedy Other Clinician: Referring Provider: Treating Provider/Extender: Loma Sender in Treatment: 7 Subjective History of Present Illness (HPI) 05/14/15; this is a 75 year old woman with a history of diabetes who was referred here I think after being seen in the emergency room on 9/29 with sudden edema of the right leg and a large resultant blister on the posterior medial part of the right leg. She had a previous blister about a week before her arrival there  however this it open by the time she came to the emergency room. She has no history of known PAD, prior wound. She does have a history of a cardiomyopathy with her last echo on 10/15 showing an EF of 50-55%. She was given clindamycin orally and she is about to complete these in 2 days. She was felt to have cellulitis of the right lower extremity. 05/22/15; her duplex ultrasound was negative for DVT The Kerlix Coban appears to be controlling her edema. The substantial wound area on the right posterior . medial leg looks to be improved with epithelialization. She still has a reasonably substantial open area however I think this is going to heal there is no  evidence of infection. Her peripheral pulses are not palpable in this foot. I think this lady probably has some degree of PAD as well. interestingly, she comes in with a crop of blisters just below her knee. These are itchy and if they have been more dermatomal I might of considered zoster. However I think this is probably some form of contact dermatitis question Coban oo 05/28/15. The area on the medial aspect of her right leg continues to be improved in terms of advancing epithelialization. There is no evidence of infection. She probably has some degree of PAD. The contact dermatitis last blisters in the top of her right leg from last week are resolved. Finally she on the left leg she has a large blister which I opened this was flaccid she has edema in the left leg and I think this is the cause 06/05/15. Her right leg wound is completely epithelialized. There is no open wounds on her right leg. I had to remove the remaining skin from the blister on her left leg however this looks superficial and should be resolved next week. It also looks like she has some friction injury on the top of her right anterior first metatarsal head. I've advised to protect this in her shoes 06/11/15; she has now to open areas one posteriorly on the left leg and one anteriorly on the left. Still poorly controlled edema and may be developing a small additional blister on the right leg. None of this looks to have infection.she is obtained juxtalite stockings at home but she did not bring them today 06/19/15; patient returns today all of her wounds are closed. She has significant venous insufficiency with inflammation right greater than left considerable hemosiderin deposition on the right. We are able to discharge her in her juxtalite stockings. I gave her instructions for lubricating the skin her lower legs twice a day READMISSION; 02/05/16; this is a patient we had in this clinic from 10/7 through 11/4 2016. At that point I  felt she had coexistent venous insufficiency and edema and PAD. She presented with a blister and cellulitis on the medial right leg which ultimately form the wound which progressively epithelialized over several weeks and then resolved. During her stay here she does not seem to pad noninvasive arterial studies. She also developed large flaccid blisters on her left leg that formed wounds as well. We ultimately discharged. Juxtalite stockings to control the swelling. The patient is a type II diabetic on insulin. He would appear that she has fairly significant chronic renal failure with the last creatinine on 4/22,017 at 3.36. Estimated GFR of 13. Last hemoglobin A1c of 9.0 in April 2017 the patient was seen by her primary physician Dr. Iona Beard Osei-Bonsu. He noted what sounds like cellulitis on the posterior left leg and a  clean 6 cm x 5 cm ulcer with surrounding skin redness. She was put on antibiotics. The patient states she has not had any wounds on her legs since she was here in the fall. I'm not exactly sure how she treated the ulcer in her primary physician's office however she arrived with no ulcer on the left leg only eschar that peeled off to reveal normal skin. It is fairly clear that the patient is not wearing her juxtalite stockings. She is not describing shortness breath or chest pain but does describe heaviness in her legs when she walks forcing her to stop and rest. In this clinic her ABI on the right was 0.65 and on the left 0.54. 02/19/16; the patient arrived with a small hemorrhagic blister on the left anterior leg. There is nothing on the right. She's been to see Dr. Gwenlyn Found the formal studies showed an ABI in the right of 0.64 on the left of 0.59. TBI in the right 0.48 0.49 on the left. She had bilateral common femoral artery waves were triphasic. Bilateral popliteal peroneal anterior and posterior tibial arteries were monophasic. She has a follow-up with Dr. Gwenlyn Found next week 02/26/16; left  leg blisters healed however she has a right leg blister this was excised with pickups and scissors. She's been to see Dr. Gwenlyn Found who notes her chronic renal failure, the reasonably unimpressive nature of her wounds and did not want to proceed with angiography. He will see her in 3 months 03/04/16; left leg blisters have healed however she did not come in with her juxtalite stockings. She has been to see Dr. Gwenlyn Found did not want to proceed with angiography. Follow-up in 3 months. She developed a blister on the right last week we excised it. Dimensions are better 09/29/16- the patient made an appointment for a large blister on the anterior aspect of her right lower extremity since making the appointment the blister deroofed and has completely healed and epithelialized. She has not been using her juxta lites that were prescribed nor has she been wearing compression stockings. READMISSION 09/14/2020 This is a patient that we had in the clinic in 2016 and 17. She was last here in February 2018. These were largely for what was felt to be chronic venous insufficiency wounds on her bilateral lower extremities. At one point she had bilateral juxta lite stockings. She was also noted to have diabetic angiopathy with PAD. She went to see Dr. Gwenlyn Found but because the wounds were predominantly venous and she was in stage IV chronic renal failure nothing further was done about her PAD. She comes back in clinic today with wounds on her right heel, right first toe, left first toe and 3 wounds on the left anterior lower extremity. She is complaining of a lot of pain in the left foot, right heel. She is not a smoker. Past medical history includes coronary artery disease with ischemic cardiomyopathy, type 2 diabetes, coronary artery disease, osteoarthritis, hypertension, chronic renal failure now on dialysis, hyperlipidemia, asthma ABIs in our clinic today were 0.5 on the right and 0.55 on the left 2/14; we do not have an  appointment for vascular studies and we continue to work on this. She has areas on her left anterior lower leg which look mostly like venous wounds. She has areas on the right first toe tips right fourth toe tip and right heel. She is complaining a lot of pain however it is difficult to really determine how much of the pain she experiences is  secondary to claudication. We wrapped both legs last time however it looks like she has wrap injury on the upper medial right leg with blistering and excoriations. 2/21; arterial studies are on the 23rd with Dr. Kennon Holter office. The patient's areas on the left anterior lower leg which are mostly venous wounds are closed. On the right the tip of her first toe and fifth toe have eschar I did not remove this. There is still an open area on the right heel were using silver alginate 10/09/2020 upon evaluation today patient appears to be doing well with regard to her ulcerations. This is on the right heel and left great toe. With that being said she unfortunately does not have good blood flow in either extremity. In fact I did review her Doppler study and it appears that on the right she had an ABI of 0.75 which was dampened and a TBI of 0.16. On the left she had an ABI of 0.55 and absent signals in the toes we could not even get a TBI. Obviously this is even a bit worse than her previous evaluation. This indicates at least moderate right lower extremity arterial disease and moderate left lower extremity arterial disease. With that being said interestingly and in a good way the patient is actually showing some signs of improvement nonetheless with regard to her wounds. Fortunately there is no evidence of active infection at this time which is great news. No fevers, chills, nausea, vomiting, or diarrhea. 10/30/2020; this is a patient I have not seen in over a month but she was seen here 3 weeks ago. We have been following her for wounds on her right heel and left great toe  paradoxically both of these are healed today. However she arrives in clinic today with swelling in her bilateral lower legs 3+ pitting edema and 3 wound areas that started as blisters on the left anterior left posterior and left lateral lower leg. These all look like basis of previously denuded blisters. Also worrisome is she has on the medial upper calf a crop of small vesicular blisters which are quite extensive. These are not painful or itchy. I am not exactly sure what is causing this. She dialyzes at the [facility] on [address] Tuesday Thursday Saturday I had previously received a phone call 2 or 3 weeks ago from Dr. Gwenlyn Found although the patient has known PAD and did not have vibrant ABIs on the left an ABI of 0.55 and a TBI of 0.16 at the time she did not have open wounds and Dr. Gwenlyn Found did not feel he needed to do any interventions 4/1; this is a patient with chronic venous insufficiency as well as significant arterial insufficiency. We saw her last week with blisters denuded are largely on the left anterior left lateral and left posterior lower leg. We put her in kerlix Coban as we really cannot wrap this more aggressively. She does not have compression stockings. We have been using silver alginate as the primary dressing Objective Constitutional Patient is hypertensive.. Pulse regular and within target range for patient.[name] Respirations regular, non-labored and within target range.. Temperature is normal and within the target range for the patient.[name] Appears in no distress. Vitals Time Taken: 3:21 PM, Height: 63 in, Weight: 187 lbs, BMI: 33.1, Temperature: 98.4 F, Pulse: 69 bpm, Respiratory Rate: 17 breaths/min, Blood Pressure: 172/80 mmHg. Cardiovascular Pedal pulses are palpable. General Notes: Wound exam; the patient has areas on the left anterior left lateral and left posterior legs the  remanence of fairly large denuded blisters. The area on posterior is just about closed the  anterior slightly healthier as well as the lateral. No debridement was required Integumentary (Hair, Skin) Given changes of chronic venous insufficiency and probable lymphedema. Wound #13 status is Open. Original cause of wound was Blister. The date acquired was: 10/23/2020. The wound has been in treatment 1 weeks. The wound is located on the Left,Proximal,Anterior Lower Leg. The wound measures 9cm length x 5cm width x 0.1cm depth; 35.343cm^2 area and 3.534cm^3 volume. There is Fat Layer (Subcutaneous Tissue) exposed. There is no tunneling or undermining noted. There is a large amount of serosanguineous drainage noted. The wound margin is distinct with the outline attached to the wound base. There is large (67-100%) pink granulation within the wound bed. There is no necrotic tissue within the wound bed. Wound #14 status is Open. Original cause of wound was Blister. The date acquired was: 10/23/2020. The wound has been in treatment 1 weeks. The wound is located on the Left,Lateral Lower Leg. The wound measures 3.5cm length x 2cm width x 0.1cm depth; 5.498cm^2 area and 0.55cm^3 volume. There is Fat Layer (Subcutaneous Tissue) exposed. There is no tunneling or undermining noted. There is a medium amount of serosanguineous drainage noted. The wound margin is distinct with the outline attached to the wound base. There is large (67-100%) pink granulation within the wound bed. There is no necrotic tissue within the wound bed. Wound #15 status is Open. Original cause of wound was Gradually Appeared. The date acquired was: 10/23/2020. The wound has been in treatment 1 weeks. The wound is located on the Left,Posterior Lower Leg. The wound measures 0.4cm length x 0.3cm width x 0.1cm depth; 0.094cm^2 area and 0.009cm^3 volume. There is Fat Layer (Subcutaneous Tissue) exposed. There is no tunneling or undermining noted. There is a medium amount of serosanguineous drainage noted. The wound margin is distinct with the  outline attached to the wound base. There is large (67-100%) pink granulation within the wound bed. There is no necrotic tissue within the wound bed. Assessment Active Problems ICD-10 Type 2 diabetes mellitus with diabetic peripheral angiopathy without gangrene Chronic venous hypertension (idiopathic) with inflammation of bilateral lower extremity Non-pressure chronic ulcer of other part of left lower leg with fat layer exposed Type 2 diabetes mellitus with diabetic polyneuropathy Plan Follow-up Appointments: Return Appointment in 1 week. Bathing/ Shower/ Hygiene: May shower with protection but do not get wound dressing(s) wet. Edema Control - Lymphedema / SCD / Other: Elevate legs to the level of the heart or above for 30 minutes daily and/or when sitting, a frequency of: - throughout the day Avoid standing for long periods of time. Patient to wear own compression stockings every day. - Wear compression stockings on right leg: apply in morning and remove at bedtime. Exercise regularly Moisturize legs daily. - right leg and foot daily Additional Orders / Instructions: Follow Nutritious Diet WOUND #13: - Lower Leg Wound Laterality: Left, Anterior, Proximal Cleanser: Soap and Water 1 x Per Week/15 Days Discharge Instructions: May shower and wash wound with dial antibacterial soap and water prior to dressing change. Peri-Wound Care: Triamcinolone 15 (g) 1 x Per Week/15 Days Discharge Instructions: Apply liberally to left leg Prim Dressing: KerraCel Ag Gelling Fiber Dressing, 4x5 in (silver alginate) 1 x Per Week/15 Days ary Discharge Instructions: Apply silver alginate to wound bed as instructed Secondary Dressing: Woven Gauze Sponge, Non-Sterile 4x4 in 1 x Per Week/15 Days Discharge Instructions: Apply over primary dressing as  directed. Secondary Dressing: ABD Pad, 8x10 1 x Per Week/15 Days Discharge Instructions: Apply over primary dressing as directed. Com pression Wrap: Kerlix Roll  4.5x3.1 (in/yd) 1 x Per Week/15 Days Discharge Instructions: Apply Kerlix and Coban compression as directed. Com pression Wrap: Coban Self-Adherent Wrap 4x5 (in/yd) 1 x Per Week/15 Days Discharge Instructions: Apply over Kerlix as directed. WOUND #14: - Lower Leg Wound Laterality: Left, Lateral Cleanser: Soap and Water 1 x Per Week/15 Days Discharge Instructions: May shower and wash wound with dial antibacterial soap and water prior to dressing change. Peri-Wound Care: Triamcinolone 15 (g) 1 x Per Week/15 Days Discharge Instructions: Apply liberally to left leg Prim Dressing: KerraCel Ag Gelling Fiber Dressing, 4x5 in (silver alginate) 1 x Per Week/15 Days ary Discharge Instructions: Apply silver alginate to wound bed as instructed Secondary Dressing: Woven Gauze Sponge, Non-Sterile 4x4 in 1 x Per Week/15 Days Discharge Instructions: Apply over primary dressing as directed. Secondary Dressing: ABD Pad, 8x10 1 x Per Week/15 Days Discharge Instructions: Apply over primary dressing as directed. Com pression Wrap: Kerlix Roll 4.5x3.1 (in/yd) 1 x Per Week/15 Days Discharge Instructions: Apply Kerlix and Coban compression as directed. Com pression Wrap: Coban Self-Adherent Wrap 4x5 (in/yd) 1 x Per Week/15 Days Discharge Instructions: Apply over Kerlix as directed. WOUND #15: - Lower Leg Wound Laterality: Left, Posterior Cleanser: Soap and Water 1 x Per Week/15 Days Discharge Instructions: May shower and wash wound with dial antibacterial soap and water prior to dressing change. Peri-Wound Care: Triamcinolone 15 (g) 1 x Per Week/15 Days Discharge Instructions: Apply liberally to left leg Prim Dressing: KerraCel Ag Gelling Fiber Dressing, 4x5 in (silver alginate) 1 x Per Week/15 Days ary Discharge Instructions: Apply silver alginate to wound bed as instructed Secondary Dressing: Woven Gauze Sponge, Non-Sterile 4x4 in 1 x Per Week/15 Days Discharge Instructions: Apply over primary dressing as  directed. Secondary Dressing: ABD Pad, 8x10 1 x Per Week/15 Days Discharge Instructions: Apply over primary dressing as directed. Com pression Wrap: Kerlix Roll 4.5x3.1 (in/yd) 1 x Per Week/15 Days Discharge Instructions: Apply Kerlix and Coban compression as directed. Com pression Wrap: Coban Self-Adherent Wrap 4x5 (in/yd) 1 x Per Week/15 Days Discharge Instructions: Apply over Kerlix as directed. 1. We are using silver alginate which will continue 2. Wrapping gently in kerlix Coban. 3. I am not sure she could handle 3 layer compression in terms of her arterial status. 4. She was already seen by Dr. Gwenlyn Found who did not feel she was a candidate for interventions although she did not have wounds at that time 5. We are going to have to get her compression stockings probably 20/30. I think she should be able to help tolerate this Electronic Signature(s) Signed: 11/06/2020 5:01:47 PM By: Linton Ham MD Entered By: Linton Ham on 11/06/2020 16:52:54 -------------------------------------------------------------------------------- SuperBill Details Patient Name: Date of Service: [name], DELO RES M. 11/06/2020 Medical Record Number: [id number] Patient Account Number: [id number] Date of Birth/Sex: Treating RN: 1946-02-02 (75 y.o. Sue Lush Primary Care Provider: Benito Mccreedy Other Clinician: Referring Provider: Treating Provider/Extender: Loma Sender in Treatment: 7 Diagnosis Coding ICD-10 Codes Code Description E11.51 Type 2 diabetes mellitus with diabetic peripheral angiopathy without gangrene I87.323 Chronic venous hypertension (idiopathic) with inflammation of bilateral lower extremity L97.411 Non-pressure chronic ulcer of right heel and midfoot limited to breakdown of skin L97.521 Non-pressure chronic ulcer of other part of left foot limited to breakdown of skin L97.822 Non-pressure chronic ulcer of other part of left lower leg with  fat  layer exposed E11.42 Type 2 diabetes mellitus with diabetic polyneuropathy Facility Procedures CPT4 Code: 18485927 Description: 63943 - WOUND CARE VISIT-LEV 3 EST PT Modifier: Quantity: 1 Physician Procedures : CPT4 Code Description Modifier [id number] - WC PHYS LEVEL 3 - EST PT ICD-10 Diagnosis Description I87.323 Chronic venous hypertension (idiopathic) with inflammation of bilateral lower extremity E11.51 Type 2 diabetes mellitus with diabetic  peripheral angiopathy without gangrene L97.822 Non-pressure chronic ulcer of other part of left lower leg with fat layer exposed Quantity: 1 Electronic Signature(s) Signed: 11/06/2020 5:01:47 PM By: Linton Ham MD Entered By: Linton Ham on 11/06/2020 16:53:15

## 2020-11-07 DIAGNOSIS — N186 End stage renal disease: Secondary | ICD-10-CM | POA: Diagnosis not present

## 2020-11-07 DIAGNOSIS — N2581 Secondary hyperparathyroidism of renal origin: Secondary | ICD-10-CM | POA: Diagnosis not present

## 2020-11-07 DIAGNOSIS — Z992 Dependence on renal dialysis: Secondary | ICD-10-CM | POA: Diagnosis not present

## 2020-11-09 DIAGNOSIS — E785 Hyperlipidemia, unspecified: Secondary | ICD-10-CM | POA: Diagnosis not present

## 2020-11-09 DIAGNOSIS — Z720 Tobacco use: Secondary | ICD-10-CM | POA: Diagnosis not present

## 2020-11-09 DIAGNOSIS — N186 End stage renal disease: Secondary | ICD-10-CM | POA: Diagnosis not present

## 2020-11-09 DIAGNOSIS — Z992 Dependence on renal dialysis: Secondary | ICD-10-CM | POA: Diagnosis not present

## 2020-11-09 DIAGNOSIS — G4709 Other insomnia: Secondary | ICD-10-CM | POA: Diagnosis not present

## 2020-11-09 DIAGNOSIS — E119 Type 2 diabetes mellitus without complications: Secondary | ICD-10-CM | POA: Diagnosis not present

## 2020-11-09 DIAGNOSIS — I10 Essential (primary) hypertension: Secondary | ICD-10-CM | POA: Diagnosis not present

## 2020-11-09 NOTE — Progress Notes (Signed)
OLUWADARA, GORMAN ([id number]) Visit Report for 11/06/2020 Arrival Information Details Patient Name: Date of Service: Kathleen Lah M. 11/06/2020 2:45 PM Medical Record Number: [id number] Patient Account Number: [id number] Date of Birth/Sex: Treating RN: 04-01-1946 (75 y.o. Sue Lush Primary Care Andruw Battie: Benito Mccreedy Other Clinician: Referring Parys Elenbaas: Treating Jola Critzer/Extender: Loma Sender in Treatment: 7 Visit Information History Since Last Visit Added or deleted any medications: No Patient Arrived: Wheel Chair Any new allergies or adverse reactions: No Arrival Time: 15:07 Had a fall or experienced change in No Accompanied By: self activities of daily living that may affect Transfer Assistance: None risk of falls: Patient Identification Verified: Yes Signs or symptoms of abuse/neglect since last visito No Secondary Verification Process Completed: Yes Hospitalized since last visit: No Patient Requires Transmission-Based Precautions: No Implantable device outside of the clinic excluding No Patient Has Alerts: No cellular tissue based products placed in the center since last visit: Has Dressing in Place as Prescribed: Yes Pain Present Now: Yes Electronic Signature(s) Signed: 11/09/2020 10:23:55 AM By: Sandre Kitty Entered By: Sandre Kitty on 11/06/2020 15:21:35 -------------------------------------------------------------------------------- Clinic Level of Care Assessment Details Patient Name: Date of Service: Kathleen Lah M. 11/06/2020 2:45 PM Medical Record Number: [id number] Patient Account Number: [id number] Date of Birth/Sex: Treating RN: [birth year] (75 y.o. Sue Lush Primary Care Sonyia Muro: Benito Mccreedy Other Clinician: Referring Beverlyn Mcginness: Treating Samia Kukla/Extender: Loma Sender in Treatment: 7 Clinic Level of Care Assessment Items TOOL 4 Quantity Score X- 1 0 Use when  only an EandM is performed on FOLLOW-UP visit ASSESSMENTS - Nursing Assessment / Reassessment X- 1 10 Reassessment of Co-morbidities (includes updates in patient status) X- 1 5 Reassessment of Adherence to Treatment Plan ASSESSMENTS - Wound and Skin A ssessment / Reassessment []  - 0 Simple Wound Assessment / Reassessment - one wound X- 3 5 Complex Wound Assessment / Reassessment - multiple wounds []  - 0 Dermatologic / Skin Assessment (not related to wound area) ASSESSMENTS - Focused Assessment []  - 0 Circumferential Edema Measurements - multi extremities []  - 0 Nutritional Assessment / Counseling / Intervention []  - 0 Lower Extremity Assessment (monofilament, tuning fork, pulses) []  - 0 Peripheral Arterial Disease Assessment (using hand held doppler) ASSESSMENTS - Ostomy and/or Continence Assessment and Care []  - 0 Incontinence Assessment and Management []  - 0 Ostomy Care Assessment and Management (repouching, etc.) PROCESS - Coordination of Care []  - 0 Simple Patient / Family Education for ongoing care X- 1 20 Complex (extensive) Patient / Family Education for ongoing care []  - 0 Staff obtains Programmer, systems, Records, T Results / Process Orders est []  - 0 Staff telephones HHA, Nursing Homes / Clarify orders / etc []  - 0 Routine Transfer to another Facility (non-emergent condition) []  - 0 Routine Hospital Admission (non-emergent condition) []  - 0 New Admissions / Biomedical engineer / Ordering NPWT Apligraf, etc. , []  - 0 Emergency Hospital Admission (emergent condition) []  - 0 Simple Discharge Coordination []  - 0 Complex (extensive) Discharge Coordination PROCESS - Special Needs []  - 0 Pediatric / Minor Patient Management []  - 0 Isolation Patient Management []  - 0 Hearing / Language / Visual special needs []  - 0 Assessment of Community assistance (transportation, D/C planning, etc.) []  - 0 Additional assistance / Altered mentation []  - 0 Support  Surface(s) Assessment (bed, cushion, seat, etc.) INTERVENTIONS - Wound Cleansing / Measurement []  - 0 Simple Wound Cleansing - one wound X- 3 5 Complex Wound Cleansing - multiple wounds X- 1  5 Wound Imaging (photographs - any number of wounds) []  - 0 Wound Tracing (instead of photographs) []  - 0 Simple Wound Measurement - one wound X- 3 5 Complex Wound Measurement - multiple wounds INTERVENTIONS - Wound Dressings []  - 0 Small Wound Dressing one or multiple wounds []  - 0 Medium Wound Dressing one or multiple wounds X- 1 20 Large Wound Dressing one or multiple wounds X- 1 5 Application of Medications - topical []  - 0 Application of Medications - injection INTERVENTIONS - Miscellaneous []  - 0 External ear exam []  - 0 Specimen Collection (cultures, biopsies, blood, body fluids, etc.) []  - 0 Specimen(s) / Culture(s) sent or taken to Lab for analysis []  - 0 Patient Transfer (multiple staff / Civil Service fast streamer / Similar devices) []  - 0 Simple Staple / Suture removal (25 or less) []  - 0 Complex Staple / Suture removal (26 or more) []  - 0 Hypo / Hyperglycemic Management (close monitor of Blood Glucose) []  - 0 Ankle / Brachial Index (ABI) - do not check if billed separately X- 1 5 Vital Signs Has the patient been seen at the hospital within the last three years: Yes Total Score: 115 Level Of Care: New/Established - Level 3 Electronic Signature(s) Signed: 11/06/2020 5:18:29 PM By: Lorrin Jackson Entered By: Lorrin Jackson on 11/06/2020 16:05:10 -------------------------------------------------------------------------------- Encounter Discharge Information Details Patient Name: Date of Service: Kathleen Carbon RES M. 11/06/2020 2:45 PM Medical Record Number: [id number] Patient Account Number: [id number] Date of Birth/Sex: Treating RN: [birth year] (75 y.o. [name] Primary Care Rumaldo Difatta: Benito Mccreedy Other Clinician: Referring Shirleymae Hauth: Treating Daeja Helderman/Extender: Loma Sender in Treatment: 7 Encounter Discharge Information Items Discharge Condition: Stable Ambulatory Status: Wheelchair Discharge Destination: Home Transportation: Private Auto Accompanied By: self Schedule Follow-up Appointment: Yes Clinical Summary of Care: Electronic Signature(s) Signed: 11/06/2020 5:39:41 PM By: Deon Pilling Entered By: Deon Pilling on 11/06/2020 17:17:51 -------------------------------------------------------------------------------- Lower Extremity Assessment Details Patient Name: Date of Service: Kathleen Carbon RES M. 11/06/2020 2:45 PM Medical Record Number: [id number] Patient Account Number: [id number] Date of Birth/Sex: Treating RN: 1946-05-28 (75 y.o. [name] Primary Care Analiyah Lechuga: Benito Mccreedy Other Clinician: Referring Ziyon Soltau: Treating Tandy Lewin/Extender: Loma Sender in Treatment: 7 Edema Assessment Assessed: Shirlyn Goltz: Yes] Patrice Paradise: Yes] Edema: [Left: Yes] [Right: Yes] Calf Left: Right: Point of Measurement: 28 cm From Medial Instep 37 cm 36 cm Ankle Left: Right: Point of Measurement: 9 cm From Medial Instep 18 cm 21 cm Electronic Signature(s) Signed: 11/06/2020 5:39:41 PM By: Deon Pilling Entered By: Deon Pilling on 11/06/2020 15:44:03 -------------------------------------------------------------------------------- Multi Wound Chart Details Patient Name: Date of Service: Kathleen Carbon RES M. 11/06/2020 2:45 PM Medical Record Number: [id number] Patient Account Number: [id number] Date of Birth/Sex: Treating RN: [birth year] (75 y.o. Sue Lush Primary Care Emonni Depasquale: Benito Mccreedy Other Clinician: Referring Brinley Rosete: Treating Faye Sanfilippo/Extender: Loma Sender in Treatment: 7 Vital Signs Height(in): 63 Pulse(bpm): 22 Weight(lbs): 187 Blood Pressure(mmHg): 172/80 Body Mass Index(BMI): 33 Temperature(F): 98.4 Respiratory Rate(breaths/min):  17 Photos: Left, Proximal, Anterior Lower Leg Left, Lateral Lower Leg Left, Posterior Lower Leg Wound Location: Blister Blister Gradually Appeared Wounding Event: Diabetic Wound/Ulcer of the Lower Diabetic Wound/Ulcer of the Lower Diabetic Wound/Ulcer of the Lower Primary Etiology: Extremity Extremity Extremity Cataracts, Glaucoma, Anemia, Cataracts, Glaucoma, Anemia, Cataracts, Glaucoma, Anemia, Comorbid History: Asthma, Congestive Heart Failure, Asthma, Congestive Heart Failure, Asthma, Congestive Heart Failure, Coronary Artery Disease, Coronary Artery Disease, Coronary Artery Disease, Hypertension, Peripheral Arterial Hypertension, Peripheral Arterial Hypertension, Peripheral Arterial Disease,  Type II Diabetes, Disease, Type II Diabetes, Disease, Type II Diabetes, Osteoarthritis Osteoarthritis Osteoarthritis 10/23/2020 10/23/2020 10/23/2020 Date Acquired: 1 1 1  Weeks of Treatment: Open Open Open Wound Status: 9x5x0.1 3.5x2x0.1 0.4x0.3x0.1 Measurements L x W x D (cm) 35.343 5.498 0.094 A (cm) : rea 3.534 0.55 0.009 Volume (cm) : 9.10% 14.00% 0.00% % Reduction in A rea: 9.10% 13.90% 52.60% % Reduction in Volume: Grade 2 Grade 2 Grade 2 Classification: Large Medium Medium Exudate A mount: Serosanguineous Serosanguineous Serosanguineous Exudate Type: red, brown red, brown red, brown Exudate Color: Distinct, outline attached Distinct, outline attached Distinct, outline attached Wound Margin: Large (67-100%) Large (67-100%) Large (67-100%) Granulation A mount: Pink Pink Pink Granulation Quality: None Present (0%) None Present (0%) None Present (0%) Necrotic A mount: Fat Layer (Subcutaneous Tissue): Yes Fat Layer (Subcutaneous Tissue): Yes Fat Layer (Subcutaneous Tissue): Yes Exposed Structures: Fascia: No Fascia: No Fascia: No Tendon: No Tendon: No Tendon: No Muscle: No Muscle: No Muscle: No Joint: No Joint: No Joint: No Bone: No Bone: No Bone: No Small  (1-33%) Small (1-33%) Large (67-100%) Epithelialization: Treatment Notes Electronic Signature(s) Signed: 11/06/2020 5:01:47 PM By: Linton Ham MD Signed: 11/06/2020 5:18:29 PM By: Lorrin Jackson Entered By: Linton Ham on 11/06/2020 16:48:13 -------------------------------------------------------------------------------- Multi-Disciplinary Care Plan Details Patient Name: Date of Service: Kathleen Carbon RES M. 11/06/2020 2:45 PM Medical Record Number: [id number] Patient Account Number: [id number] Date of Birth/Sex: Treating RN: April 18, 1946 (75 y.o. Sue Lush Primary Care Conrado Nance: Benito Mccreedy Other Clinician: Referring Antion Andres: Treating Vaishnav Demartin/Extender: Loma Sender in Treatment: 7 Multidisciplinary Care Plan reviewed with physician Active Inactive Abuse / Safety / Falls / Self Care Management Nursing Diagnoses: Potential for falls Potential for injury related to falls Goals: Patient will not experience any injury related to falls Date Initiated: 09/14/2020 Target Resolution Date: 11/20/2020 Goal Status: Active Patient/caregiver will verbalize/demonstrate measures taken to prevent injury and/or falls Date Initiated: 09/14/2020 Target Resolution Date: 11/20/2020 Goal Status: Active Interventions: Assess Activities of Daily Living upon admission and as needed Assess fall risk on admission and as needed Assess: immobility, friction, shearing, incontinence upon admission and as needed Assess impairment of mobility on admission and as needed per policy Assess personal safety and home safety (as indicated) on admission and as needed Assess self care needs on admission and as needed Provide education on fall prevention Provide education on personal and home safety Notes: Nutrition Nursing Diagnoses: Impaired glucose control: actual or potential Potential for alteratiion in Nutrition/Potential for imbalanced  nutrition Goals: Patient/caregiver agrees to and verbalizes understanding of need to use nutritional supplements and/or vitamins as prescribed Date Initiated: 09/14/2020 Target Resolution Date: 11/20/2020 Goal Status: Active Patient/caregiver will maintain therapeutic glucose control Date Initiated: 09/14/2020 Target Resolution Date: 11/20/2020 Goal Status: Active Interventions: Assess HgA1c results as ordered upon admission and as needed Assess patient nutrition upon admission and as needed per policy Provide education on elevated blood sugars and impact on wound healing Provide education on nutrition Treatment Activities: Education provided on Nutrition : 09/14/2020 Notes: Venous Leg Ulcer Nursing Diagnoses: Knowledge deficit related to disease process and management Goals: Patient/caregiver will verbalize understanding of disease process and disease management Date Initiated: 09/14/2020 Target Resolution Date: 11/20/2020 Goal Status: Active Interventions: Assess peripheral edema status every visit. Notes: Wound/Skin Impairment Nursing Diagnoses: Impaired tissue integrity Knowledge deficit related to ulceration/compromised skin integrity Goals: Patient/caregiver will verbalize understanding of skin care regimen Date Initiated: 09/14/2020 Target Resolution Date: 11/20/2020 Goal Status: Active Interventions: Assess patient/caregiver ability to obtain necessary supplies  Assess patient/caregiver ability to perform ulcer/skin care regimen upon admission and as needed Assess ulceration(s) every visit Provide education on ulcer and skin care Notes: Electronic Signature(s) Signed: 11/06/2020 3:55:56 PM By: Lorrin Jackson Entered By: Lorrin Jackson on 11/06/2020 15:55:54 -------------------------------------------------------------------------------- Pain Assessment Details Patient Name: Date of Service: Kathleen Carbon RES M. 11/06/2020 2:45 PM Medical Record Number: [id number] Patient  Account Number: [id number] Date of Birth/Sex: Treating RN: [birth year] (75 y.o. Sue Lush Primary Care Eulonda Andalon: Benito Mccreedy Other Clinician: Referring Natayla Cadenhead: Treating Memori Sammon/Extender: Loma Sender in Treatment: 7 Active Problems Location of Pain Severity and Description of Pain Patient Has Paino Yes Site Locations Rate the pain. Current Pain Level: 4 Pain Management and Medication Current Pain Management: Electronic Signature(s) Signed: 11/06/2020 5:18:29 PM By: Lorrin Jackson Signed: 11/09/2020 10:23:55 AM By: Sandre Kitty Entered By: Sandre Kitty on 11/06/2020 15:22:16 -------------------------------------------------------------------------------- Patient/Caregiver Education Details Patient Name: Date of Service: Kathleen Carbon RES M. 4/1/2022andnbsp2:45 PM Medical Record Number: [id number] Patient Account Number: [id number] Date of Birth/Gender: Treating RN: 01-01-1946 (75 y.o. Sue Lush Primary Care Physician: Benito Mccreedy Other Clinician: Referring Physician: Treating Physician/Extender: Loma Sender in Treatment: 7 Education Assessment Education Provided To: Patient Education Topics Provided Elevated Blood Sugar/ Impact on Healing: Methods: Explain/Verbal, Printed Responses: State content correctly Wound/Skin Impairment: Methods: Explain/Verbal, Printed Responses: State content correctly Electronic Signature(s) Signed: 11/06/2020 5:18:29 PM By: Lorrin Jackson Entered By: Lorrin Jackson on 11/06/2020 15:56:32 -------------------------------------------------------------------------------- Wound Assessment Details Patient Name: Date of Service: Kathleen Carbon RES M. 11/06/2020 2:45 PM Medical Record Number: [id number] Patient Account Number: [id number] Date of Birth/Sex: Treating RN: 1945/11/01 (75 y.o. Sue Lush Primary Care Maicie Vanderloop: Benito Mccreedy Other  Clinician: Referring Tauri Ethington: Treating Keawe Marcello/Extender: Loma Sender in Treatment: 7 Wound Status Wound Number: 13 Primary Diabetic Wound/Ulcer of the Lower Extremity Etiology: Wound Location: Left, Proximal, Anterior Lower Leg Wound Open Wounding Event: Blister Status: Date Acquired: 10/23/2020 Comorbid Cataracts, Glaucoma, Anemia, Asthma, Congestive Heart Failure, Weeks Of Treatment: 1 History: Coronary Artery Disease, Hypertension, Peripheral Arterial Disease, Clustered Wound: No Type II Diabetes, Osteoarthritis Photos Wound Measurements Length: (cm) 9 Width: (cm) 5 Depth: (cm) 0.1 Area: (cm) 35.343 Volume: (cm) 3.534 % Reduction in Area: 9.1% % Reduction in Volume: 9.1% Epithelialization: Small (1-33%) Tunneling: No Undermining: No Wound Description Classification: Grade 2 Wound Margin: Distinct, outline attached Exudate Amount: Large Exudate Type: Serosanguineous Exudate Color: red, brown Foul Odor After Cleansing: No Slough/Fibrino No Wound Bed Granulation Amount: Large (67-100%) Exposed Structure Granulation Quality: Pink Fascia Exposed: No Necrotic Amount: None Present (0%) Fat Layer (Subcutaneous Tissue) Exposed: Yes Tendon Exposed: No Muscle Exposed: No Joint Exposed: No Bone Exposed: No Treatment Notes Wound #13 (Lower Leg) Wound Laterality: Left, Anterior, Proximal Cleanser Soap and Water Discharge Instruction: May shower and wash wound with dial antibacterial soap and water prior to dressing change. Peri-Wound Care Triamcinolone 15 (g) Discharge Instruction: Apply liberally to left leg Topical Primary Dressing KerraCel Ag Gelling Fiber Dressing, 4x5 in (silver alginate) Discharge Instruction: Apply silver alginate to wound bed as instructed Secondary Dressing Woven Gauze Sponge, Non-Sterile 4x4 in Discharge Instruction: Apply over primary dressing as directed. ABD Pad, 8x10 Discharge Instruction: Apply over  primary dressing as directed. Secured With Compression Wrap Kerlix Roll 4.5x3.1 (in/yd) Discharge Instruction: Apply Kerlix and Coban compression as directed. Coban Self-Adherent Wrap 4x5 (in/yd) Discharge Instruction: Apply over Kerlix as directed. Compression Stockings Add-Ons Electronic Signature(s) Signed: 11/06/2020 5:18:29 PM By: Lorrin Jackson Signed: 11/09/2020 10:23:55  AM By: Sandre Kitty Entered By: Sandre Kitty on 11/06/2020 16:38:52 -------------------------------------------------------------------------------- Wound Assessment Details Patient Name: Date of Service: Kathleen Carbon RES M. 11/06/2020 2:45 PM Medical Record Number: [id number] Patient Account Number: [id number] Date of Birth/Sex: Treating RN: April 07, 1946 (75 y.o. Sue Lush Primary Care Scotti Motter: Benito Mccreedy Other Clinician: Referring Abiola Behring: Treating Anastashia Westerfeld/Extender: Loma Sender in Treatment: 7 Wound Status Wound Number: 14 Primary Diabetic Wound/Ulcer of the Lower Extremity Etiology: Wound Location: Left, Lateral Lower Leg Wound Open Wounding Event: Blister Status: Date Acquired: 10/23/2020 Comorbid Cataracts, Glaucoma, Anemia, Asthma, Congestive Heart Failure, Weeks Of Treatment: 1 History: Coronary Artery Disease, Hypertension, Peripheral Arterial Disease, Clustered Wound: No Type II Diabetes, Osteoarthritis Photos Wound Measurements Length: (cm) 3.5 Width: (cm) 2 Depth: (cm) 0.1 Area: (cm) 5.498 Volume: (cm) 0.55 % Reduction in Area: 14% % Reduction in Volume: 13.9% Epithelialization: Small (1-33%) Tunneling: No Undermining: No Wound Description Classification: Grade 2 Wound Margin: Distinct, outline attached Exudate Amount: Medium Exudate Type: Serosanguineous Exudate Color: red, brown Foul Odor After Cleansing: No Slough/Fibrino No Wound Bed Granulation Amount: Large (67-100%) Exposed Structure Granulation Quality: Pink Fascia  Exposed: No Necrotic Amount: None Present (0%) Fat Layer (Subcutaneous Tissue) Exposed: Yes Tendon Exposed: No Muscle Exposed: No Joint Exposed: No Bone Exposed: No Treatment Notes Wound #14 (Lower Leg) Wound Laterality: Left, Lateral Cleanser Soap and Water Discharge Instruction: May shower and wash wound with dial antibacterial soap and water prior to dressing change. Peri-Wound Care Triamcinolone 15 (g) Discharge Instruction: Apply liberally to left leg Topical Primary Dressing KerraCel Ag Gelling Fiber Dressing, 4x5 in (silver alginate) Discharge Instruction: Apply silver alginate to wound bed as instructed Secondary Dressing Woven Gauze Sponge, Non-Sterile 4x4 in Discharge Instruction: Apply over primary dressing as directed. ABD Pad, 8x10 Discharge Instruction: Apply over primary dressing as directed. Secured With Compression Wrap Kerlix Roll 4.5x3.1 (in/yd) Discharge Instruction: Apply Kerlix and Coban compression as directed. Coban Self-Adherent Wrap 4x5 (in/yd) Discharge Instruction: Apply over Kerlix as directed. Compression Stockings Add-Ons Electronic Signature(s) Signed: 11/06/2020 5:18:29 PM By: Lorrin Jackson Signed: 11/09/2020 10:23:55 AM By: Sandre Kitty Entered By: Sandre Kitty on 11/06/2020 16:39:57 -------------------------------------------------------------------------------- Wound Assessment Details Patient Name: Date of Service: Kathleen Carbon RES M. 11/06/2020 2:45 PM Medical Record Number: [id number] Patient Account Number: [id number] Date of Birth/Sex: Treating RN: 1946-05-07 (75 y.o. Sue Lush Primary Care Aland Chestnutt: Benito Mccreedy Other Clinician: Referring Mallika Sanmiguel: Treating Pami Wool/Extender: Loma Sender in Treatment: 7 Wound Status Wound Number: 15 Primary Diabetic Wound/Ulcer of the Lower Extremity Etiology: Wound Location: Left, Posterior Lower Leg Wound Open Wounding Event: Gradually  Appeared Status: Date Acquired: 10/23/2020 Comorbid Cataracts, Glaucoma, Anemia, Asthma, Congestive Heart Failure, Weeks Of Treatment: 1 History: Coronary Artery Disease, Hypertension, Peripheral Arterial Disease, Clustered Wound: No Type II Diabetes, Osteoarthritis Photos Wound Measurements Length: (cm) 0.4 Width: (cm) 0.3 Depth: (cm) 0.1 Area: (cm) 0.094 Volume: (cm) 0.009 % Reduction in Area: 0% % Reduction in Volume: 52.6% Epithelialization: Large (67-100%) Tunneling: No Undermining: No Wound Description Classification: Grade 2 Wound Margin: Distinct, outline attached Exudate Amount: Medium Exudate Type: Serosanguineous Exudate Color: red, brown Foul Odor After Cleansing: No Slough/Fibrino No Wound Bed Granulation Amount: Large (67-100%) Exposed Structure Granulation Quality: Pink Fascia Exposed: No Necrotic Amount: None Present (0%) Fat Layer (Subcutaneous Tissue) Exposed: Yes Tendon Exposed: No Muscle Exposed: No Joint Exposed: No Bone Exposed: No Treatment Notes Wound #15 (Lower Leg) Wound Laterality: Left, Posterior Cleanser Soap and Water Discharge Instruction: May shower and wash wound  with dial antibacterial soap and water prior to dressing change. Peri-Wound Care Triamcinolone 15 (g) Discharge Instruction: Apply liberally to left leg Topical Primary Dressing KerraCel Ag Gelling Fiber Dressing, 4x5 in (silver alginate) Discharge Instruction: Apply silver alginate to wound bed as instructed Secondary Dressing Woven Gauze Sponge, Non-Sterile 4x4 in Discharge Instruction: Apply over primary dressing as directed. ABD Pad, 8x10 Discharge Instruction: Apply over primary dressing as directed. Secured With Compression Wrap Kerlix Roll 4.5x3.1 (in/yd) Discharge Instruction: Apply Kerlix and Coban compression as directed. Coban Self-Adherent Wrap 4x5 (in/yd) Discharge Instruction: Apply over Kerlix as directed. Compression Stockings Add-Ons Electronic  Signature(s) Signed: 11/06/2020 5:18:29 PM By: Lorrin Jackson Signed: 11/09/2020 10:23:55 AM By: Sandre Kitty Entered By: Sandre Kitty on 11/06/2020 16:39:29 -------------------------------------------------------------------------------- Vitals Details Patient Name: Date of Service: Kathleen [name], Kathleen RES M. 11/06/2020 2:45 PM Medical Record Number: [id number] Patient Account Number: [id number] Date of Birth/Sex: Treating RN: Oct 18, 1945 (75 y.o. Sue Lush Primary Care Manuel Lawhead: Other Clinician: Benito Mccreedy Referring Kostantinos Tallman: Treating Mistey Hoffert/Extender: Loma Sender in Treatment: 7 Vital Signs Time Taken: 15:21 Temperature (F): 98.4 Height (in): 63 Pulse (bpm): 69 Weight (lbs): 187 Respiratory Rate (breaths/min): 17 Body Mass Index (BMI): 33.1 Blood Pressure (mmHg): 172/80 Reference Range: 80 - 120 mg / dl Electronic Signature(s) Signed: 11/09/2020 10:23:55 AM By: Sandre Kitty Entered By: Sandre Kitty on 11/06/2020 15:21:53

## 2020-11-10 ENCOUNTER — Emergency Department (HOSPITAL_COMMUNITY): Payer: Medicare HMO

## 2020-11-10 ENCOUNTER — Other Ambulatory Visit: Payer: Self-pay

## 2020-11-10 ENCOUNTER — Encounter (HOSPITAL_COMMUNITY): Payer: Self-pay

## 2020-11-10 ENCOUNTER — Inpatient Hospital Stay (HOSPITAL_COMMUNITY)
Admission: EM | Admit: 2020-11-10 | Discharge: 2020-12-06 | DRG: 291 | Disposition: E | Payer: Medicare HMO | Attending: Pulmonary Disease | Admitting: Pulmonary Disease

## 2020-11-10 DIAGNOSIS — E669 Obesity, unspecified: Secondary | ICD-10-CM | POA: Diagnosis present

## 2020-11-10 DIAGNOSIS — Z7189 Other specified counseling: Secondary | ICD-10-CM | POA: Diagnosis not present

## 2020-11-10 DIAGNOSIS — E162 Hypoglycemia, unspecified: Secondary | ICD-10-CM | POA: Diagnosis not present

## 2020-11-10 DIAGNOSIS — I5033 Acute on chronic diastolic (congestive) heart failure: Principal | ICD-10-CM | POA: Diagnosis present

## 2020-11-10 DIAGNOSIS — Z8249 Family history of ischemic heart disease and other diseases of the circulatory system: Secondary | ICD-10-CM

## 2020-11-10 DIAGNOSIS — Z83438 Family history of other disorder of lipoprotein metabolism and other lipidemia: Secondary | ICD-10-CM

## 2020-11-10 DIAGNOSIS — I953 Hypotension of hemodialysis: Secondary | ICD-10-CM | POA: Diagnosis not present

## 2020-11-10 DIAGNOSIS — M6281 Muscle weakness (generalized): Secondary | ICD-10-CM | POA: Diagnosis not present

## 2020-11-10 DIAGNOSIS — Z79899 Other long term (current) drug therapy: Secondary | ICD-10-CM

## 2020-11-10 DIAGNOSIS — R451 Restlessness and agitation: Secondary | ICD-10-CM | POA: Diagnosis not present

## 2020-11-10 DIAGNOSIS — R0902 Hypoxemia: Secondary | ICD-10-CM

## 2020-11-10 DIAGNOSIS — I5189 Other ill-defined heart diseases: Secondary | ICD-10-CM

## 2020-11-10 DIAGNOSIS — I2729 Other secondary pulmonary hypertension: Secondary | ICD-10-CM | POA: Diagnosis not present

## 2020-11-10 DIAGNOSIS — E785 Hyperlipidemia, unspecified: Secondary | ICD-10-CM | POA: Diagnosis present

## 2020-11-10 DIAGNOSIS — R400 Somnolence: Secondary | ICD-10-CM | POA: Diagnosis present

## 2020-11-10 DIAGNOSIS — Z841 Family history of disorders of kidney and ureter: Secondary | ICD-10-CM

## 2020-11-10 DIAGNOSIS — I517 Cardiomegaly: Secondary | ICD-10-CM | POA: Diagnosis not present

## 2020-11-10 DIAGNOSIS — E8779 Other fluid overload: Secondary | ICD-10-CM | POA: Diagnosis present

## 2020-11-10 DIAGNOSIS — Z978 Presence of other specified devices: Secondary | ICD-10-CM | POA: Diagnosis not present

## 2020-11-10 DIAGNOSIS — Z20822 Contact with and (suspected) exposure to covid-19: Secondary | ICD-10-CM | POA: Diagnosis present

## 2020-11-10 DIAGNOSIS — E1151 Type 2 diabetes mellitus with diabetic peripheral angiopathy without gangrene: Secondary | ICD-10-CM | POA: Diagnosis present

## 2020-11-10 DIAGNOSIS — Z515 Encounter for palliative care: Secondary | ICD-10-CM | POA: Diagnosis not present

## 2020-11-10 DIAGNOSIS — G478 Other sleep disorders: Secondary | ICD-10-CM | POA: Diagnosis present

## 2020-11-10 DIAGNOSIS — J9602 Acute respiratory failure with hypercapnia: Secondary | ICD-10-CM | POA: Diagnosis present

## 2020-11-10 DIAGNOSIS — Z87891 Personal history of nicotine dependence: Secondary | ICD-10-CM

## 2020-11-10 DIAGNOSIS — Z4659 Encounter for fitting and adjustment of other gastrointestinal appliance and device: Secondary | ICD-10-CM

## 2020-11-10 DIAGNOSIS — R2689 Other abnormalities of gait and mobility: Secondary | ICD-10-CM | POA: Diagnosis not present

## 2020-11-10 DIAGNOSIS — E559 Vitamin D deficiency, unspecified: Secondary | ICD-10-CM | POA: Diagnosis present

## 2020-11-10 DIAGNOSIS — J9600 Acute respiratory failure, unspecified whether with hypoxia or hypercapnia: Secondary | ICD-10-CM

## 2020-11-10 DIAGNOSIS — J9601 Acute respiratory failure with hypoxia: Secondary | ICD-10-CM | POA: Diagnosis not present

## 2020-11-10 DIAGNOSIS — E1121 Type 2 diabetes mellitus with diabetic nephropathy: Secondary | ICD-10-CM | POA: Diagnosis not present

## 2020-11-10 DIAGNOSIS — R509 Fever, unspecified: Secondary | ICD-10-CM

## 2020-11-10 DIAGNOSIS — I428 Other cardiomyopathies: Secondary | ICD-10-CM | POA: Diagnosis present

## 2020-11-10 DIAGNOSIS — E1159 Type 2 diabetes mellitus with other circulatory complications: Secondary | ICD-10-CM | POA: Diagnosis not present

## 2020-11-10 DIAGNOSIS — R918 Other nonspecific abnormal finding of lung field: Secondary | ICD-10-CM | POA: Diagnosis not present

## 2020-11-10 DIAGNOSIS — J9811 Atelectasis: Secondary | ICD-10-CM | POA: Diagnosis not present

## 2020-11-10 DIAGNOSIS — I152 Hypertension secondary to endocrine disorders: Secondary | ICD-10-CM | POA: Diagnosis present

## 2020-11-10 DIAGNOSIS — I132 Hypertensive heart and chronic kidney disease with heart failure and with stage 5 chronic kidney disease, or end stage renal disease: Secondary | ICD-10-CM | POA: Diagnosis not present

## 2020-11-10 DIAGNOSIS — I2510 Atherosclerotic heart disease of native coronary artery without angina pectoris: Secondary | ICD-10-CM | POA: Diagnosis present

## 2020-11-10 DIAGNOSIS — I272 Pulmonary hypertension, unspecified: Secondary | ICD-10-CM | POA: Diagnosis present

## 2020-11-10 DIAGNOSIS — Z9911 Dependence on respirator [ventilator] status: Secondary | ICD-10-CM | POA: Diagnosis not present

## 2020-11-10 DIAGNOSIS — Z992 Dependence on renal dialysis: Secondary | ICD-10-CM

## 2020-11-10 DIAGNOSIS — E875 Hyperkalemia: Secondary | ICD-10-CM | POA: Diagnosis not present

## 2020-11-10 DIAGNOSIS — I5020 Unspecified systolic (congestive) heart failure: Secondary | ICD-10-CM | POA: Diagnosis not present

## 2020-11-10 DIAGNOSIS — Z23 Encounter for immunization: Secondary | ICD-10-CM

## 2020-11-10 DIAGNOSIS — E1169 Type 2 diabetes mellitus with other specified complication: Secondary | ICD-10-CM | POA: Diagnosis present

## 2020-11-10 DIAGNOSIS — Z8616 Personal history of COVID-19: Secondary | ICD-10-CM

## 2020-11-10 DIAGNOSIS — R7989 Other specified abnormal findings of blood chemistry: Secondary | ICD-10-CM | POA: Diagnosis present

## 2020-11-10 DIAGNOSIS — D631 Anemia in chronic kidney disease: Secondary | ICD-10-CM | POA: Diagnosis present

## 2020-11-10 DIAGNOSIS — N186 End stage renal disease: Secondary | ICD-10-CM | POA: Diagnosis not present

## 2020-11-10 DIAGNOSIS — K219 Gastro-esophageal reflux disease without esophagitis: Secondary | ICD-10-CM | POA: Diagnosis present

## 2020-11-10 DIAGNOSIS — R54 Age-related physical debility: Secondary | ICD-10-CM | POA: Diagnosis present

## 2020-11-10 DIAGNOSIS — J9622 Acute and chronic respiratory failure with hypercapnia: Secondary | ICD-10-CM | POA: Diagnosis present

## 2020-11-10 DIAGNOSIS — E161 Other hypoglycemia: Secondary | ICD-10-CM | POA: Diagnosis not present

## 2020-11-10 DIAGNOSIS — I050 Rheumatic mitral stenosis: Secondary | ICD-10-CM | POA: Diagnosis not present

## 2020-11-10 DIAGNOSIS — Z66 Do not resuscitate: Secondary | ICD-10-CM | POA: Diagnosis not present

## 2020-11-10 DIAGNOSIS — J690 Pneumonitis due to inhalation of food and vomit: Secondary | ICD-10-CM | POA: Diagnosis not present

## 2020-11-10 DIAGNOSIS — Z8673 Personal history of transient ischemic attack (TIA), and cerebral infarction without residual deficits: Secondary | ICD-10-CM | POA: Diagnosis not present

## 2020-11-10 DIAGNOSIS — R531 Weakness: Secondary | ICD-10-CM | POA: Diagnosis not present

## 2020-11-10 DIAGNOSIS — Z6834 Body mass index (BMI) 34.0-34.9, adult: Secondary | ICD-10-CM

## 2020-11-10 DIAGNOSIS — Z86011 Personal history of benign neoplasm of the brain: Secondary | ICD-10-CM

## 2020-11-10 DIAGNOSIS — Z452 Encounter for adjustment and management of vascular access device: Secondary | ICD-10-CM | POA: Diagnosis not present

## 2020-11-10 DIAGNOSIS — E8889 Other specified metabolic disorders: Secondary | ICD-10-CM | POA: Diagnosis present

## 2020-11-10 DIAGNOSIS — I509 Heart failure, unspecified: Secondary | ICD-10-CM | POA: Diagnosis not present

## 2020-11-10 DIAGNOSIS — E11649 Type 2 diabetes mellitus with hypoglycemia without coma: Secondary | ICD-10-CM | POA: Diagnosis present

## 2020-11-10 DIAGNOSIS — E662 Morbid (severe) obesity with alveolar hypoventilation: Secondary | ICD-10-CM | POA: Diagnosis present

## 2020-11-10 DIAGNOSIS — I499 Cardiac arrhythmia, unspecified: Secondary | ICD-10-CM | POA: Diagnosis not present

## 2020-11-10 DIAGNOSIS — J9621 Acute and chronic respiratory failure with hypoxia: Secondary | ICD-10-CM | POA: Diagnosis present

## 2020-11-10 DIAGNOSIS — Z888 Allergy status to other drugs, medicaments and biological substances status: Secondary | ICD-10-CM

## 2020-11-10 DIAGNOSIS — J811 Chronic pulmonary edema: Secondary | ICD-10-CM | POA: Diagnosis not present

## 2020-11-10 DIAGNOSIS — E1129 Type 2 diabetes mellitus with other diabetic kidney complication: Secondary | ICD-10-CM | POA: Diagnosis present

## 2020-11-10 DIAGNOSIS — I10 Essential (primary) hypertension: Secondary | ICD-10-CM | POA: Diagnosis not present

## 2020-11-10 DIAGNOSIS — J9612 Chronic respiratory failure with hypercapnia: Secondary | ICD-10-CM | POA: Diagnosis present

## 2020-11-10 DIAGNOSIS — I5042 Chronic combined systolic (congestive) and diastolic (congestive) heart failure: Secondary | ICD-10-CM | POA: Diagnosis not present

## 2020-11-10 DIAGNOSIS — N2581 Secondary hyperparathyroidism of renal origin: Secondary | ICD-10-CM | POA: Diagnosis present

## 2020-11-10 DIAGNOSIS — E1122 Type 2 diabetes mellitus with diabetic chronic kidney disease: Secondary | ICD-10-CM | POA: Diagnosis present

## 2020-11-10 DIAGNOSIS — Z9115 Patient's noncompliance with renal dialysis: Secondary | ICD-10-CM

## 2020-11-10 DIAGNOSIS — I872 Venous insufficiency (chronic) (peripheral): Secondary | ICD-10-CM | POA: Diagnosis present

## 2020-11-10 DIAGNOSIS — Z0189 Encounter for other specified special examinations: Secondary | ICD-10-CM

## 2020-11-10 DIAGNOSIS — R238 Other skin changes: Secondary | ICD-10-CM | POA: Diagnosis present

## 2020-11-10 DIAGNOSIS — E871 Hypo-osmolality and hyponatremia: Secondary | ICD-10-CM | POA: Diagnosis not present

## 2020-11-10 DIAGNOSIS — Z8719 Personal history of other diseases of the digestive system: Secondary | ICD-10-CM

## 2020-11-10 DIAGNOSIS — G9341 Metabolic encephalopathy: Secondary | ICD-10-CM | POA: Diagnosis not present

## 2020-11-10 DIAGNOSIS — M170 Bilateral primary osteoarthritis of knee: Secondary | ICD-10-CM | POA: Diagnosis present

## 2020-11-10 DIAGNOSIS — R110 Nausea: Secondary | ICD-10-CM | POA: Diagnosis not present

## 2020-11-10 DIAGNOSIS — I959 Hypotension, unspecified: Secondary | ICD-10-CM | POA: Diagnosis not present

## 2020-11-10 DIAGNOSIS — E872 Acidosis: Secondary | ICD-10-CM | POA: Diagnosis present

## 2020-11-10 DIAGNOSIS — Z4682 Encounter for fitting and adjustment of non-vascular catheter: Secondary | ICD-10-CM | POA: Diagnosis not present

## 2020-11-10 DIAGNOSIS — K567 Ileus, unspecified: Secondary | ICD-10-CM

## 2020-11-10 DIAGNOSIS — Z86010 Personal history of colonic polyps: Secondary | ICD-10-CM

## 2020-11-10 DIAGNOSIS — I081 Rheumatic disorders of both mitral and tricuspid valves: Secondary | ICD-10-CM | POA: Diagnosis present

## 2020-11-10 DIAGNOSIS — K5900 Constipation, unspecified: Secondary | ICD-10-CM | POA: Diagnosis not present

## 2020-11-10 DIAGNOSIS — R1110 Vomiting, unspecified: Secondary | ICD-10-CM | POA: Diagnosis not present

## 2020-11-10 DIAGNOSIS — D649 Anemia, unspecified: Secondary | ICD-10-CM | POA: Diagnosis not present

## 2020-11-10 DIAGNOSIS — Z90710 Acquired absence of both cervix and uterus: Secondary | ICD-10-CM

## 2020-11-10 DIAGNOSIS — G4700 Insomnia, unspecified: Secondary | ICD-10-CM | POA: Diagnosis present

## 2020-11-10 DIAGNOSIS — Z7982 Long term (current) use of aspirin: Secondary | ICD-10-CM

## 2020-11-10 DIAGNOSIS — R627 Adult failure to thrive: Secondary | ICD-10-CM | POA: Diagnosis present

## 2020-11-10 DIAGNOSIS — I878 Other specified disorders of veins: Secondary | ICD-10-CM | POA: Diagnosis present

## 2020-11-10 DIAGNOSIS — Z794 Long term (current) use of insulin: Secondary | ICD-10-CM

## 2020-11-10 DIAGNOSIS — J90 Pleural effusion, not elsewhere classified: Secondary | ICD-10-CM | POA: Diagnosis not present

## 2020-11-10 HISTORY — DX: Acute combined systolic (congestive) and diastolic (congestive) heart failure: I50.41

## 2020-11-10 HISTORY — DX: Hyperosmolality and hypernatremia: E87.0

## 2020-11-10 HISTORY — DX: Benign neoplasm of cecum: D12.0

## 2020-11-10 HISTORY — DX: Benign neoplasm of ascending colon: D12.2

## 2020-11-10 HISTORY — DX: Patient's other noncompliance with medication regimen for other reason: Z91.148

## 2020-11-10 HISTORY — DX: Gastritis, unspecified, without bleeding: K29.70

## 2020-11-10 HISTORY — DX: Patient's other noncompliance with medication regimen: Z91.14

## 2020-11-10 HISTORY — DX: Pleural effusion, not elsewhere classified: J90

## 2020-11-10 HISTORY — DX: Acute respiratory failure with hypercapnia: J96.02

## 2020-11-10 HISTORY — DX: Disorientation, unspecified: R41.0

## 2020-11-10 HISTORY — DX: Melena: K92.1

## 2020-11-10 HISTORY — DX: Benign neoplasm of sigmoid colon: D12.5

## 2020-11-10 HISTORY — DX: Other specified symptoms and signs involving the circulatory and respiratory systems: R09.89

## 2020-11-10 LAB — COMPREHENSIVE METABOLIC PANEL
ALT: 19 U/L (ref 0–44)
AST: 23 U/L (ref 15–41)
Albumin: 3.5 g/dL (ref 3.5–5.0)
Alkaline Phosphatase: 66 U/L (ref 38–126)
Anion gap: 10 (ref 5–15)
BUN: 55 mg/dL — ABNORMAL HIGH (ref 8–23)
CO2: 31 mmol/L (ref 22–32)
Calcium: 9.3 mg/dL (ref 8.9–10.3)
Chloride: 98 mmol/L (ref 98–111)
Creatinine, Ser: 8.83 mg/dL — ABNORMAL HIGH (ref 0.44–1.00)
GFR, Estimated: 4 mL/min — ABNORMAL LOW (ref >60)
Glucose, Bld: 61 mg/dL — ABNORMAL LOW (ref 70–99)
Potassium: 5.8 mmol/L — ABNORMAL HIGH (ref 3.5–5.1)
Sodium: 139 mmol/L (ref 135–145)
Total Bilirubin: 0.4 mg/dL (ref 0.3–1.2)
Total Protein: 6.8 g/dL (ref 6.5–8.1)

## 2020-11-10 LAB — I-STAT VENOUS BLOOD GAS, ED
Acid-Base Excess: 1 mmol/L (ref 0.0–2.0)
Bicarbonate: 31.2 mmol/L — ABNORMAL HIGH (ref 20.0–28.0)
Calcium, Ion: 1.16 mmol/L (ref 1.15–1.40)
HCT: 43 % (ref 36.0–46.0)
Hemoglobin: 14.6 g/dL (ref 12.0–15.0)
O2 Saturation: 92 %
Potassium: 5.6 mmol/L — ABNORMAL HIGH (ref 3.5–5.1)
Sodium: 138 mmol/L (ref 135–145)
TCO2: 33 mmol/L — ABNORMAL HIGH (ref 22–32)
pCO2, Ven: 77.2 mmHg (ref 44.0–60.0)
pH, Ven: 7.214 — ABNORMAL LOW (ref 7.250–7.430)
pO2, Ven: 80 mmHg — ABNORMAL HIGH (ref 32.0–45.0)

## 2020-11-10 LAB — CBC WITH DIFFERENTIAL/PLATELET
Abs Immature Granulocytes: 0.02 10*3/uL (ref 0.00–0.07)
Basophils Absolute: 0 10*3/uL (ref 0.0–0.1)
Basophils Relative: 0 %
Eosinophils Absolute: 0.2 10*3/uL (ref 0.0–0.5)
Eosinophils Relative: 5 %
HCT: 43.3 % (ref 36.0–46.0)
Hemoglobin: 12.6 g/dL (ref 12.0–15.0)
Immature Granulocytes: 0 %
Lymphocytes Relative: 22 %
Lymphs Abs: 1.1 10*3/uL (ref 0.7–4.0)
MCH: 31 pg (ref 26.0–34.0)
MCHC: 29.1 g/dL — ABNORMAL LOW (ref 30.0–36.0)
MCV: 106.4 fL — ABNORMAL HIGH (ref 80.0–100.0)
Monocytes Absolute: 0.4 10*3/uL (ref 0.1–1.0)
Monocytes Relative: 7 %
Neutro Abs: 3.3 10*3/uL (ref 1.7–7.7)
Neutrophils Relative %: 66 %
Platelets: 193 10*3/uL (ref 150–400)
RBC: 4.07 MIL/uL (ref 3.87–5.11)
RDW: 15 % (ref 11.5–15.5)
WBC: 5.1 10*3/uL (ref 4.0–10.5)
nRBC: 0 % (ref 0.0–0.2)

## 2020-11-10 LAB — RESP PANEL BY RT-PCR (FLU A&B, COVID) ARPGX2
Influenza A by PCR: NEGATIVE
Influenza B by PCR: NEGATIVE
SARS Coronavirus 2 by RT PCR: NEGATIVE

## 2020-11-10 LAB — CBG MONITORING, ED
Glucose-Capillary: 55 mg/dL — ABNORMAL LOW (ref 70–99)
Glucose-Capillary: 60 mg/dL — ABNORMAL LOW (ref 70–99)

## 2020-11-10 LAB — BLOOD GAS, VENOUS
Acid-Base Excess: 2.3 mmol/L — ABNORMAL HIGH (ref 0.0–2.0)
Bicarbonate: 30.4 mmol/L — ABNORMAL HIGH (ref 20.0–28.0)
Drawn by: 1390
O2 Saturation: 70.6 %
Patient temperature: 36.5
pCO2, Ven: 87.8 mmHg (ref 44.0–60.0)
pH, Ven: 7.162 — CL (ref 7.250–7.430)
pO2, Ven: 45.5 mmHg — ABNORMAL HIGH (ref 32.0–45.0)

## 2020-11-10 LAB — BETA-HYDROXYBUTYRIC ACID: Beta-Hydroxybutyric Acid: 0.25 mmol/L (ref 0.05–0.27)

## 2020-11-10 LAB — GLUCOSE, CAPILLARY: Glucose-Capillary: 118 mg/dL — ABNORMAL HIGH (ref 70–99)

## 2020-11-10 LAB — MAGNESIUM: Magnesium: 2.7 mg/dL — ABNORMAL HIGH (ref 1.7–2.4)

## 2020-11-10 LAB — LIPASE, BLOOD: Lipase: 33 U/L (ref 11–51)

## 2020-11-10 MED ORDER — CARVEDILOL 25 MG PO TABS
25.0000 mg | ORAL_TABLET | Freq: Two times a day (BID) | ORAL | Status: DC
  Administered 2020-11-11 (×2): 25 mg via ORAL
  Filled 2020-11-10 (×3): qty 1

## 2020-11-10 MED ORDER — ATORVASTATIN CALCIUM 40 MG PO TABS
40.0000 mg | ORAL_TABLET | Freq: Every day | ORAL | Status: DC
  Administered 2020-11-10 – 2020-11-14 (×4): 40 mg via ORAL
  Filled 2020-11-10 (×5): qty 1

## 2020-11-10 MED ORDER — ADULT MULTIVITAMIN W/MINERALS CH
1.0000 | ORAL_TABLET | Freq: Every day | ORAL | Status: DC
  Administered 2020-11-10 – 2020-11-11 (×2): 1 via ORAL
  Filled 2020-11-10 (×2): qty 1

## 2020-11-10 MED ORDER — ACETAMINOPHEN 650 MG RE SUPP: 650.0000 mg | Freq: Four times a day (QID) | RECTAL | Status: DC | PRN

## 2020-11-10 MED ORDER — MIRTAZAPINE 15 MG PO TABS
7.5000 mg | ORAL_TABLET | Freq: Every day | ORAL | Status: DC
  Administered 2020-11-11 – 2020-11-13 (×2): 7.5 mg via ORAL
  Filled 2020-11-10 (×5): qty 1

## 2020-11-10 MED ORDER — INSULIN ASPART 100 UNIT/ML ~~LOC~~ SOLN: 0.0000 [IU] | Freq: Three times a day (TID) | SUBCUTANEOUS | Status: DC

## 2020-11-10 MED ORDER — HYDRALAZINE HCL 50 MG PO TABS
50.0000 mg | ORAL_TABLET | Freq: Every day | ORAL | Status: DC
  Administered 2020-11-10: 50 mg via ORAL
  Filled 2020-11-10: qty 2
  Filled 2020-11-10: qty 1

## 2020-11-10 MED ORDER — HEPARIN SODIUM (PORCINE) 5000 UNIT/ML IJ SOLN
5000.0000 [IU] | Freq: Three times a day (TID) | INTRAMUSCULAR | Status: DC
  Administered 2020-11-10 – 2020-11-25 (×40): 5000 [IU] via SUBCUTANEOUS
  Filled 2020-11-10 (×41): qty 1

## 2020-11-10 MED ORDER — PANTOPRAZOLE SODIUM 40 MG PO TBEC
40.0000 mg | DELAYED_RELEASE_TABLET | Freq: Every day | ORAL | Status: DC
  Administered 2020-11-10 – 2020-11-14 (×4): 40 mg via ORAL
  Filled 2020-11-10 (×5): qty 1

## 2020-11-10 MED ORDER — SODIUM POLYSTYRENE SULFONATE 15 GM/60ML PO SUSP
30.0000 g | Freq: Once | ORAL | Status: AC
  Administered 2020-11-10: 30 g via ORAL
  Filled 2020-11-10: qty 120

## 2020-11-10 MED ORDER — ACETAMINOPHEN 325 MG PO TABS
650.0000 mg | ORAL_TABLET | Freq: Four times a day (QID) | ORAL | Status: DC | PRN
  Administered 2020-11-14: 650 mg via ORAL
  Filled 2020-11-10 (×2): qty 2

## 2020-11-10 MED ORDER — ASPIRIN EC 81 MG PO TBEC
81.0000 mg | DELAYED_RELEASE_TABLET | Freq: Every day | ORAL | Status: DC
  Administered 2020-11-10 – 2020-11-14 (×4): 81 mg via ORAL
  Filled 2020-11-10 (×5): qty 1

## 2020-11-10 MED ORDER — CALCIUM ACETATE (PHOS BINDER) 667 MG PO CAPS
2001.0000 mg | ORAL_CAPSULE | Freq: Three times a day (TID) | ORAL | Status: DC
  Administered 2020-11-11 – 2020-11-14 (×7): 2001 mg via ORAL
  Filled 2020-11-10 (×12): qty 3

## 2020-11-10 NOTE — ED Triage Notes (Signed)
BIB EMS for weakness and nausea. Pt reports lose of appetite for a week now. Pt is T,T and Sat Dialyis. Last one was Saturday. Before that pt missed two weeks of dialysis. Pt is lethargic but oriented x 4. Initial CBG with EMS was 90, Was given d10, recheck was 106.

## 2020-11-10 NOTE — Progress Notes (Signed)
FPTS Interim Progress Note  S: Went to examine left lower extremity wound along with Dr. Jeannine Kitten. Patient was asleep upon our arrival. Denies any concerns at this time.  O: BP (!) 134/51 (BP Location: Right Arm)   Pulse 68   Temp 97.8 F (36.6 C) (Oral)   Resp 20   Ht 5\' 3"  (1.6 m)   LMP  (LMP Unknown)   SpO2 98%   BMI 33.13 kg/m   General: Patient sleeping in bed comfortably, in no acute distress. Resp: normal work of breathing Derm: right LE covered in clean and dry dressing, left LE initially covered in dry dressing and upon removal noted to have healing 3-5 cm blisters with surrounding erythema Neuro: alert and easily arousable to voice, answers questions appropriately  A/P: -Left LE healing well, not concerning for cellulitis or other infectious process -wound care consulted with plan to see in the am -continue remainder of plan as previously stated   Donney Dice, DO 11/19/2020, 9:29 PM PGY-1, [facility] pager [phone]

## 2020-11-10 NOTE — ED Provider Notes (Signed)
[facility] Provider Note   CSN: [id number] Arrival date & time: 11/18/2020  1002     History Chief Complaint  Patient presents with  . Weakness  . Vomiting    Kathleen [name] is a 75 y.o. female.  HPI   75 year old female with past medical history of HTN, HLD, anemia, DM, CAD, ESRD on HD every TRS presents the emergency department generalized weakness.  Patient states she has felt fatigued and weak for 1 week.  Patient admits that she had missed dialysis last week but was compliant this past Saturday.  Blood sugar was noted to be hypoglycemic around 42 with EMS, amp of D10 was given.  Patient states that she has had gagging but no vomiting/diarrhea.  States she is been compliant with her medications otherwise.  Denies any chest pain, shortness of breath, productive cough, fever or abdominal pain.  She has chronic bilateral lower extremity edema, left leg mainly remains wrapped, she is been having blister formation and weeping from the right lower extremity.  Past Medical History:  Diagnosis Date  . Anemia    a. 01/2014: suspected of chronic disease, negative FOBT.  [name]    knees  . Asthma    many years ago  . CAD (coronary artery disease)    a. Cath 01/2014: mild in LAD, mild-mod LCx, mod-severe RCA - for med rx.  . Diabetes mellitus without complication ([place])   . ESRD (end stage renal disease) ([place])    [place]  . History of echocardiogram    Echo (10/15):  Mod LVH, EF 50-55%, Gr 1 DD, MAC, mild MR, mild TR, PASP 35 mmHg  . Hx of transfusion   . Hyperlipidemia   . Hypertension   . Meningioma ([place])    a. Incidental dx 01/2014.  . Obesity   . Renal insufficiency   . Systolic CHF ([place])    a. 10/6642: dx with mixed ICM/NICM (out of proportion to CAD) EF 25-30% by echo.     Patient Active Problem List   Diagnosis Date Noted  . Pulmonary edema 01/29/2019  . Hyperkalemia 01/29/2019  . Acute on chronic diastolic CHF  (congestive heart failure) ([place]) 01/29/2019  . Acute respiratory failure with hypoxia ([place]) 01/29/2019  . Hematochezia   . Benign neoplasm of cecum   . Benign neoplasm of ascending colon   . Benign neoplasm of sigmoid colon   . Gastritis and gastroduodenitis   . Rectal bleeding 09/01/2018  . CVA (cerebral vascular accident) ([place]) 11/09/2017  . Acute CVA (cerebrovascular accident) ([place]) 11/08/2017  . DM (diabetes mellitus), type 2 with renal complications ([place]) 03/47/4259  . Altered mental status 07/14/2017  . Knee pain   . Acute lower UTI   . Peripheral arterial disease ([place]) 02/23/2016  . Insulin dependent diabetes mellitus 12/18/2015  . Anemia in chronic kidney disease 12/18/2015  . Dyspnea   . Goals of care, counseling/discussion   . SOB (shortness of breath)   . Encounter for palliative care   . Pulmonary vascular congestion   . Pleural effusion, left   . HLD (hyperlipidemia)   . Noncompliance with medication regimen   . Disorientation   . Somnolence   . Acute respiratory failure with hypercapnia ([place])   . Respiratory acidosis   . Hypernatremia   . Systolic and diastolic CHF, acute ([place])   . CHF, acute on chronic ([place]) 08/05/2015  . Elevated troponin 08/05/2015  . CHF (congestive heart failure) ([place]) 08/05/2015  .  Acute on chronic heart failure ([place]) 08/05/2015  . Acute on chronic combined systolic and diastolic congestive heart failure ([place])   . Left carotid bruit 11/20/2014  . Syncope and collapse 06/10/2014  . Syncope 06/10/2014  . Essential hypertension   . Unspecified vitamin D deficiency   . Cellulitis and abscess of right lower extremitiy 02/28/2014  . Chronic combined systolic and diastolic heart failure ([place]) 02/12/2014  . CAD (coronary artery disease)   . Obesity   . Meningioma ([place])   . Anemia   . ESRD on dialysis ([place]) 01/24/2014  . Hyperlipidemia 01/22/2014  . BPPV (benign paroxysmal positional vertigo) 01/22/2014  . Hypertensive urgency 01/21/2014   . Tobacco abuse 01/21/2014  . Diabetes mellitus due to abnormal insulin ([place]) 01/21/2014    Past Surgical History:  Procedure Laterality Date  . ABDOMINAL HYSTERECTOMY    . AV FISTULA PLACEMENT Left 02/10/2017   Procedure: ARTERIOVENOUS (AV) FISTULA CREATION-LEFT FOREARM;  Surgeon: Elam Dutch, MD;  Location: [facility];  Service: Vascular;  Laterality: Left;  . BIOPSY  09/04/2018   Procedure: BIOPSY;  Surgeon: Jerene Bears, MD;  Location: [facility] ENDOSCOPY;  Service: Gastroenterology;;  . BREAST SURGERY Right    Lumpectomy  . COLONOSCOPY    . COLONOSCOPY WITH PROPOFOL N/A 09/04/2018   Procedure: COLONOSCOPY WITH PROPOFOL;  Surgeon: Jerene Bears, MD;  Location: [facility];  Service: Gastroenterology;  Laterality: N/A;  . ESOPHAGOGASTRODUODENOSCOPY (EGD) WITH PROPOFOL N/A 09/04/2018   Procedure: ESOPHAGOGASTRODUODENOSCOPY (EGD) WITH PROPOFOL;  Surgeon: Jerene Bears, MD;  Location: [facility] ENDOSCOPY;  Service: Gastroenterology;  Laterality: N/A;  . FISTULA SUPERFICIALIZATION Left 08/09/2017   Procedure: FISTULA SUPERFICIALIZATION LEFT ARM ARTERIOVENOUS FISTULA;  Surgeon: Conrad St. Onge, MD;  Location: [facility];  Service: Vascular;  Laterality: Left;  . INSERTION OF DIALYSIS CATHETER Right 02/10/2017   Procedure: INSERTION OF DIALYSIS CATHETER;  Surgeon: Elam Dutch, MD;  Location: [facility];  Service: Vascular;  Laterality: Right;  . LEFT HEART CATHETERIZATION WITH CORONARY ANGIOGRAM N/A 01/27/2014   Procedure: LEFT HEART CATHETERIZATION WITH CORONARY ANGIOGRAM;  Surgeon: Jettie Booze, MD;  Location: [facility] CATH LAB;  Service: Cardiovascular;  Laterality: N/A;  . POLYPECTOMY  09/04/2018   Procedure: POLYPECTOMY;  Surgeon: Jerene Bears, MD;  Location: [facility] ENDOSCOPY;  Service: Gastroenterology;;  . TONSILLECTOMY       OB History   No obstetric history on file.     Family History  Problem Relation Age of Onset  . Diabetic kidney disease Mother   . Hypertension Mother   . Heart failure Mother    . Heart attack Mother   . Hyperlipidemia Mother   . Hypertension Father     Social History   Tobacco Use  . Smoking status: Former Smoker    Packs/day: 0.25    Years: 30.00    Pack years: 7.50    Types: Cigarettes  . Smokeless tobacco: Never Used  . Tobacco comment: quit late 1990's  Vaping Use  . Vaping Use: Never used  Substance Use Topics  . Alcohol use: No  . Drug use: No    Home Medications Prior to Admission medications   Medication Sig Start Date End Date Taking? Authorizing Provider  acetaminophen (TYLENOL) 325 MG tablet Take 2 tablets (650 mg total) by mouth every 4 (four) hours as needed for mild pain (or temp > 37.5 C (99.5 F)). 11/11/17   [name], MD  atorvastatin (LIPITOR) 40 MG tablet Take 1 tablet (40 mg total) by mouth  daily at 6 PM. 07/19/17   Aline August, MD  brimonidine-timolol (COMBIGAN) 0.2-0.5 % ophthalmic solution Place 1 drop into both eyes every 12 (twelve) hours.    [provider]  calcium acetate (PHOSLO) 667 MG capsule Take 1 capsule (667 mg total) by mouth as needed (with snacks). Patient taking differently: Take 667-2,001 mg by mouth See admin instructions. Take 3 capsules (2042m) with a meal three times a day. Take 667 mg with snack 11/11/17   RDebbe Odea MD  carvedilol (COREG) 25 MG tablet Take 25 mg by mouth 2 (two) times daily. 03/28/17   [provider]  hydrALAZINE (APRESOLINE) 25 MG tablet Take 1 tablet (25 mg total) by mouth every 8 (eight) hours as needed (SBP > 170  or DBP > 110). Patient taking differently: Take 50 mg by mouth 3 (three) times daily. 02/20/17   Mikhail, [name] DO  insulin detemir (LEVEMIR) 100 UNIT/ML injection Inject 0.15 mLs (15 Units total) into the skin at bedtime. Patient taking differently: Inject 30 Units into the skin at bedtime. 02/20/17   Mikhail, [name] DO  Multiple Vitamins-Minerals (RENAPLEX) TABS Take 1 tablet by mouth daily. 07/10/18   [provider]  pantoprazole  (PROTONIX) 40 MG tablet Take 1 tablet (40 mg total) by mouth 2 (two) times daily before a meal. Take 1 tablet twice daily for a  Month then once dailly Patient taking differently: Take 40 mg by mouth 2 (two) times daily before a meal. Take 1 tablet twice daily for a  Month then once dailly 09/04/18   GCaren Griffins MD  polyethylene glycol (MIRALAX / GLYCOLAX) packet Take 17 g by mouth daily. Patient taking differently: Take 17 g by mouth daily as needed for mild constipation. 02/20/17   MCristal Ford DO    Allergies    Baclofen and Chlorhexidine  Review of Systems   Review of Systems  Constitutional: Positive for appetite change and fatigue. Negative for chills and fever.  HENT: Negative for congestion.   Eyes: Negative for visual disturbance.  Respiratory: Negative for shortness of breath.   Cardiovascular: Positive for leg swelling. Negative for chest pain and palpitations.  Gastrointestinal: Positive for nausea. Negative for abdominal pain, diarrhea and vomiting.  Genitourinary: Negative for dysuria.  Musculoskeletal: Negative for back pain.  Skin: Negative for rash.  Neurological: Negative for headaches.    Physical Exam Updated Vital Signs BP (!) 175/100   Pulse 70   Temp 97.8 F (36.6 C) (Oral)   Resp 18   Ht _0  (1.6 m)   LMP  (LMP Unknown)   SpO2 99%   BMI 33.13 kg/m   Physical Exam Vitals and nursing note reviewed.  Constitutional:      Appearance: Normal appearance.  HENT:     Head: Normocephalic.     Mouth/Throat:     Mouth: Mucous membranes are moist.  Eyes:     Pupils: Pupils are equal, round, and reactive to light.  Cardiovascular:     Rate and Rhythm: Normal rate.  Pulmonary:     Effort: Pulmonary effort is normal. No respiratory distress.  Abdominal:     Palpations: Abdomen is soft.     Tenderness: There is no abdominal tenderness.  Musculoskeletal:     Comments: Chronically edematous appearing lower extremities, left lower extremity is  wrapped, the right lower extremity is swollen, scattered blisters with clear weeping, overlying redness without warmth, patient states leg appears baseline besides the blisters  Skin:    General: Skin  is warm.  Neurological:     Mental Status: She is alert and oriented to person, place, and time. Mental status is at baseline.  Psychiatric:        Mood and Affect: Mood normal.     ED Results / Procedures / Treatments   Labs (all labs ordered are listed, but only abnormal results are displayed) Labs Reviewed  RESP PANEL BY RT-PCR (FLU A&B, COVID) ARPGX2  CBC WITH DIFFERENTIAL/PLATELET  COMPREHENSIVE METABOLIC PANEL  LIPASE, BLOOD  URINALYSIS, ROUTINE W REFLEX MICROSCOPIC  BETA-HYDROXYBUTYRIC ACID  MAGNESIUM  BLOOD GAS, VENOUS    EKG EKG Interpretation  Date/Time:  Tuesday November 10 2020 10:32:52 EDT Ventricular Rate:  72 PR Interval:  162 QRS Duration: 90 QT Interval:  436 QTC Calculation: 478 R Axis:   93 Text Interpretation: Sinus rhythm Right axis deviation Consider left ventricular hypertrophy Abnormal T, consider ischemia, lateral leads NSR, no change from previous Confirmed by Lavenia Atlas ([id number]) on 11/13/2020 11:07:18 AM   Radiology No results found.  Procedures Procedures   Medications Ordered in ED Medications - No data to display  ED Course  I have reviewed the triage vital signs and the nursing notes.  Pertinent labs & imaging results that were available during my care of the patient were reviewed by me and considered in my medical decision making (see chart for details).    MDM Rules/Calculators/A&P                          75 year old female presents the emergency department with weakness, deconditioning and intermittent gagging.  In regards to the low blood sugar patient states that she did take her insulin yesterday and has had nothing to eat or drink.  She is hypertensive but otherwise vitals are stable on arrival.  EKG is unchanged from previous.   Lung sounds are clear, she does intermittently drop her saturations so she has been placed on 2 L nasal cannula which is a new requirement.  Chest x-ray shows cardiomegaly without any frank edema.  She has no active chest pain.  Blood work is significant for a creatinine over 8 which is much higher than her baseline, potassium of 5.8.  Abdominal labs otherwise unremarkable, patient does not make a lot of urine, has not provided a sample yet.  Covid is negative.  I spoke with on-call nephrology who states the patient needs nonemergent dialysis, they will arrange this.  Plan for medical admit for dialysis and further evaluation.  Patients evaluation and results requires admission for further treatment and care. Patient agrees with admission plan, offers no new complaints and is stable/unchanged at time of admit.  Final Clinical Impression(s) / ED Diagnoses Final diagnoses:  None    Rx / DC Orders ED Discharge Orders    None       Lorelle Gibbs, DO 11/09/2020 1608

## 2020-11-10 NOTE — ED Notes (Signed)
Per daughter, pt makes minimal amount of urine. Unable to collect urinalysis at this time.

## 2020-11-10 NOTE — Progress Notes (Signed)
Pt has a 22g in the right inteior wrist area that had tape extending into the hand. I flushed site and it was painful to this pt. This PIV was removed without complications and not bleeding. [employer]

## 2020-11-10 NOTE — Consult Note (Signed)
[facility] Renal Consultation Note    Indication for Consultation:  Management of ESRD/hemodialysis, anemia, hypertension/volume, and secondary hyperparathyroidism. PCP:  HPI: Kathleen [name] is a 75 y.o. female with ESRD, HTN, HL, CAD, HFrEF, T2DM, and B leg wounds for which she is followed by a wound clinic who is being admitted with volume overload and hyperkalemia.  Presented to the ED this morning with weakness. Says has been feeling weak and tired for a while now. Has missed a lot of dialysis recently when historically she was very compliant. Says "I think I just got sick of it, maybe I'm depressed." She did have dialysis on Saturday 4/2 but before that had gone over 1 week without it (prior HD 3/24) - before that came to all of her treatments. Admits to dyspnea and occ chest pressure, as well as nausea and belching. No overt vomiting or diarrhea, but has had softer stools that normal. No fever or chills. She has chronic LE wounds - followed regular with Dr. Dellia Nims. S/p PVD evaluation with Dr. Gwenlyn Found on 10/09/20 , noted to have chronic PAD - does not appear that any intervention planned. Seems like the wounds have progressed (likely in the setting of volume overload) - she has multiple blisters and weeping to the R leg at this time, the L leg remains bandaged.  Labs today show Na 139, K 5.8, CO2 31, Mg 2.7, Alb 3.5, WBC 5.1, Hgb 12.6. COVID/Flu negative. CXR with cardiomegaly only - no pulm edema present. EKG with NSR.  Usual dialyzes on TTS schedule at [facility].  Past Medical History:  Diagnosis Date  . Anemia    a. 01/2014: suspected of chronic disease, negative FOBT.  [name]    knees  . Asthma    many years ago  . CAD (coronary artery disease)    a. Cath 01/2014: mild in LAD, mild-mod LCx, mod-severe RCA - for med rx.  . Diabetes mellitus without complication ([place])   . ESRD (end stage renal disease) ([place])    [place]  . History of  echocardiogram    Echo (10/15):  Mod LVH, EF 50-55%, Gr 1 DD, MAC, mild MR, mild TR, PASP 35 mmHg  . Hx of transfusion   . Hyperlipidemia   . Hypertension   . Meningioma ([place])    a. Incidental dx 01/2014.  . Obesity   . Renal insufficiency   . Systolic CHF ([place])    a. 08/4780: dx with mixed ICM/NICM (out of proportion to CAD) EF 25-30% by echo.    Past Surgical History:  Procedure Laterality Date  . ABDOMINAL HYSTERECTOMY    . AV FISTULA PLACEMENT Left 02/10/2017   Procedure: ARTERIOVENOUS (AV) FISTULA CREATION-LEFT FOREARM;  Surgeon: Elam Dutch, MD;  Location: [place];  Service: Vascular;  Laterality: Left;  . BIOPSY  09/04/2018   Procedure: BIOPSY;  Surgeon: Jerene Bears, MD;  Location: [facility] ENDOSCOPY;  Service: Gastroenterology;;  . BREAST SURGERY Right    Lumpectomy  . COLONOSCOPY    . COLONOSCOPY WITH PROPOFOL N/A 09/04/2018   Procedure: COLONOSCOPY WITH PROPOFOL;  Surgeon: Jerene Bears, MD;  Location: [place];  Service: Gastroenterology;  Laterality: N/A;  . ESOPHAGOGASTRODUODENOSCOPY (EGD) WITH PROPOFOL N/A 09/04/2018   Procedure: ESOPHAGOGASTRODUODENOSCOPY (EGD) WITH PROPOFOL;  Surgeon: Jerene Bears, MD;  Location[facility] ENDOSCOPY;  Service: Gastroenterology;  Laterality: N/A;  . FISTULA SUPERFICIALIZATION Left 08/09/2017   Procedure: FISTULA SUPERFICIALIZATION LEFT ARM ARTERIOVENOUS FISTULA;  Surgeon: Conrad Glen [place],  MD;  Location: [place];  Service: Vascular;  Laterality: Left;  . INSERTION OF DIALYSIS CATHETER Right 02/10/2017   Procedure: INSERTION OF DIALYSIS CATHETER;  Surgeon: Elam Dutch, MD;  Location: [place];  Service: Vascular;  Laterality: Right;  . LEFT HEART CATHETERIZATION WITH CORONARY ANGIOGRAM N/A 01/27/2014   Procedure: LEFT HEART CATHETERIZATION WITH CORONARY ANGIOGRAM;  Surgeon: Jettie Booze, MD;  Location: [facility] CATH LAB;  Service: Cardiovascular;  Laterality: N/A;  . POLYPECTOMY  09/04/2018   Procedure: POLYPECTOMY;  Surgeon: Jerene Bears, MD;   Location: [facility] ENDOSCOPY;  Service: Gastroenterology;;  . TONSILLECTOMY     Family History  Problem Relation Age of Onset  . Diabetic kidney disease Mother   . Hypertension Mother   . Heart failure Mother   . Heart attack Mother   . Hyperlipidemia Mother   . Hypertension Father    Social History:  reports that she has quit smoking. Her smoking use included cigarettes. She has a 7.50 pack-year smoking history. She has never used smokeless tobacco. She reports that she does not drink alcohol and does not use drugs.  ROS: As per HPI otherwise negative.  Physical Exam: Vitals:   11/19/2020 1200 11/14/2020 1317 11/09/2020 1430 11/09/2020 1500  BP: (!) 151/116 (!) 157/63 (!) 153/56 (!) 162/103  Pulse: 68 70 63 62  Resp: (!) _0 Temp:      TempSrc:      SpO2: 100% 99% 99% 100%  Height:         General: Well developed, well nourished, in no acute distress. Using nasal O2. Head: Normocephalic, atraumatic, sclera non-icteric, mucus membranes are moist. Neck: Supple without lymphadenopathy/masses. JVD not elevated. Lungs: Clear bilaterally in upper lobes, dull in B bases Heart: RRR with normal S1, S2. No murmurs, rubs, or gallops appreciated. Abdomen: Soft, non-tender, non-distended with normoactive bowel sounds.  Musculoskeletal:  Strength and tone appear normal for age. Lower extremities: LLE bandaged; RLE with 1+ edema, chronic stasis derm changes with several areas of thin blistering and weeping Neuro: Alert and oriented X 3. Moves all extremities spontaneously. Psych:  Responds to questions appropriately with a normal affect. Dialysis Access: AVF + bruit  Allergies  Allergen Reactions  . Baclofen Other (See Comments)    Severe delirium when given to pateint in Dec 2018.   . Chlorhexidine Rash   Prior to Admission medications   Medication Sig Start Date End Date Taking? Authorizing Provider  acetaminophen (TYLENOL) 325 MG tablet Take 2 tablets (650 mg total) by mouth every 4  (four) hours as needed for mild pain (or temp > 37.5 C (99.5 F)). Patient taking differently: Take 650 mg by mouth 2 (two) times daily. 11/11/17  Yes [name], MD  aspirin EC 81 MG tablet Take 81 mg by mouth daily. Swallow whole.   Yes [provider]  atorvastatin (LIPITOR) 40 MG tablet Take 1 tablet (40 mg total) by mouth daily at 6 PM. 07/19/17  Yes Aline August, MD  calcium acetate (PHOSLO) 667 MG capsule Take 1 capsule (667 mg total) by mouth as needed (with snacks). Patient taking differently: Take 667-2,001 mg by mouth See admin instructions. Take 3 capsules (2001 mg totally) by mouth 3 times daily with a meal; Take 1 tablet (667 mg totally) by mouth 2 times daily with snacks 11/11/17  Yes [name], [name], MD  carvedilol (COREG) 25 MG tablet Take 25 mg by mouth 2 (two) times daily. 03/28/17  Yes [provider]  hydrALAZINE (APRESOLINE) 25 MG tablet Take 1 tablet (25 mg total) by mouth every 8 (eight) hours as needed (SBP > 170  or DBP > 110). Patient taking differently: Take 50 mg by mouth daily. 02/20/17  Yes Mikhail, Velta Addison, DO  Insulin Glargine (BASAGLAR KWIKPEN) 100 UNIT/ML Inject 30 Units into the skin at bedtime. 10/07/20  Yes [provider]  mirtazapine (REMERON) 7.5 MG tablet Take 7.5 mg by mouth at bedtime. 10/07/20  Yes [provider]  Multiple Vitamins-Minerals (RENAPLEX) TABS Take 1 tablet by mouth daily. 07/10/18  Yes [provider]  pantoprazole (PROTONIX) 40 MG tablet Take 1 tablet (40 mg total) by mouth 2 (two) times daily before a meal. Take 1 tablet twice daily for a  Month then once dailly Patient taking differently: Take 40 mg by mouth daily. 09/04/18  Yes [name], Vella Redhead, MD  polyethylene glycol (MIRALAX / GLYCOLAX) packet Take 17 g by mouth daily. Patient taking differently: Take 17 g by mouth daily as needed for mild constipation. 02/20/17  Yes Mikhail, Velta Addison, DO  triamcinolone cream (KENALOG) 0.5 % Apply 1 application  topically 2 (two) times daily as needed for rash. 10/07/20  Yes [provider]  brimonidine-timolol (COMBIGAN) 0.2-0.5 % ophthalmic solution Place 1 drop into both eyes every 12 (twelve) hours.    [provider]  insulin detemir (LEVEMIR) 100 UNIT/ML injection Inject 0.15 mLs (15 Units total) into the skin at bedtime. Patient not taking: No sig reported 02/20/17   Cristal Ford, DO   Current Facility-Administered Medications  Medication Dose Route Frequency Provider Last Rate Last Admin  . sodium polystyrene (KAYEXALATE) 15 GM/60ML suspension 30 g  30 g Oral Once Horton, Kristie M, DO       Current Outpatient Medications  Medication Sig Dispense Refill  . acetaminophen (TYLENOL) 325 MG tablet Take 2 tablets (650 mg total) by mouth every 4 (four) hours as needed for mild pain (or temp > 37.5 C (99.5 F)). (Patient taking differently: Take 650 mg by mouth 2 (two) times daily.)    . aspirin EC 81 MG tablet Take 81 mg by mouth daily. Swallow whole.    [name] atorvastatin (LIPITOR) 40 MG tablet Take 1 tablet (40 mg total) by mouth daily at 6 PM. 30 tablet 0  . calcium acetate (PHOSLO) 667 MG capsule Take 1 capsule (667 mg total) by mouth as needed (with snacks). (Patient taking differently: Take 667-2,001 mg by mouth See admin instructions. Take 3 capsules (2001 mg totally) by mouth 3 times daily with a meal; Take 1 tablet (667 mg totally) by mouth 2 times daily with snacks) 60 capsule 0  . carvedilol (COREG) 25 MG tablet Take 25 mg by mouth 2 (two) times daily.  3  . hydrALAZINE (APRESOLINE) 25 MG tablet Take 1 tablet (25 mg total) by mouth every 8 (eight) hours as needed (SBP > 170  or DBP > 110). (Patient taking differently: Take 50 mg by mouth daily.) 90 tablet 0  . Insulin Glargine (BASAGLAR KWIKPEN) 100 UNIT/ML Inject 30 Units into the skin at bedtime.    . mirtazapine (REMERON) 7.5 MG tablet Take 7.5 mg by mouth at bedtime.    . Multiple Vitamins-Minerals (RENAPLEX) TABS Take 1  tablet by mouth daily.    . pantoprazole (PROTONIX) 40 MG tablet Take 1 tablet (40 mg total) by mouth 2 (two) times daily before a meal. Take 1 tablet twice daily for a  Month then once dailly (Patient taking differently: Take 40 mg  by mouth daily.) 90 tablet 1  . polyethylene glycol (MIRALAX / GLYCOLAX) packet Take 17 g by mouth daily. (Patient taking differently: Take 17 g by mouth daily as needed for mild constipation.) 14 each 0  . triamcinolone cream (KENALOG) 0.5 % Apply 1 application topically 2 (two) times daily as needed for rash.    . brimonidine-timolol (COMBIGAN) 0.2-0.5 % ophthalmic solution Place 1 drop into both eyes every 12 (twelve) hours.    . insulin detemir (LEVEMIR) 100 UNIT/ML injection Inject 0.15 mLs (15 Units total) into the skin at bedtime. (Patient not taking: No sig reported) 10 mL 11   Labs: Basic Metabolic Panel: Recent Labs  Lab 11/16/2020 1110 12/03/2020 1210  NA 139 138  K 5.8* 5.6*  CL 98  --   CO2 31  --   GLUCOSE 61*  --   BUN 55*  --   CREATININE 8.83*  --   CALCIUM 9.3  --    Liver Function Tests: Recent Labs  Lab 11/08/2020 1110  AST 23  ALT 19  ALKPHOS 66  BILITOT 0.4  PROT 6.8  ALBUMIN 3.5   Recent Labs  Lab 11/19/2020 1110  LIPASE 33   CBC: Recent Labs  Lab 11/23/2020 1110 11/22/2020 1210  WBC 5.1  --   NEUTROABS 3.3  --   HGB 12.6 14.6  HCT 43.3 43.0  MCV 106.4*  --   PLT 193  --    Dialysis Orders:  TTS at [facility] -- last HD 4/2, prior to that last was 3/24 4hr, 350/500, EDW 82kg, 2K/2Ca, UFP #4, AVF, no heparin - Hectoral 45mg IV q HD  Assessment/Plan: 1.  Weakness: Likely multifactorial in setting of mild overload/hyperkalemia, ?uremia, and ongoing leg wounds. Denies new meds - she is unclear what all is taking. Consider checking TSH, A1c, urinalysis to rule out other causes. Per primary. 2.  Hyperkalemia: Mild, for HD today to correct. 3.  ESRD: Usual TTS schedule, has missed a lot of HD recently. Counseled, she does wish to  continue. For HD today - then per usual schedule. 4.  Hypertension/volume overload: BP high with volume excess - follow after HD. 5.  Anemia: Hgb 14.6 - no ESA need at this time. 6.  Metabolic bone disease: Ca ok, will check Phos tomorrow. Continue home meds. 7.  T2DM: Glu 61 today, is she having frequent lows? Per primary. 8.  PAD/leg wounds: Is this only stasis derm and ischemic changes?  9.  CAD/HFrEF  KVeneta Penton PA-C 11/12/2020, 3:27 PM  [facility]

## 2020-11-10 NOTE — ED Notes (Signed)
Attempted to give reportx1 

## 2020-11-10 NOTE — ED Notes (Signed)
Blood sugar 61, messaged Dr. Dina Rich

## 2020-11-10 NOTE — Progress Notes (Deleted)
[facility] Admission History and Physical Service Pager: [phone]  Patient name: Kathleen [name] Medical record number: [id number] Date of birth: 12-10-1945 Age: 75 y.o. Gender: female  Primary Care Provider: Benito Mccreedy, MD Consultants: Nephrology Code Status: Full Code Preferred Emergency Contact: [name], Son, [phone]  Chief Complaint: Weakness  Assessment and Plan: Kathleen [name] is a 75 y.o. female presenting with weakness and hypoglycemia in the setting of decreased appetite and missing one week of dialysis. PMH is significant for ESRD on HD T/Th/Sa, CHF, acute CVA, T2DM, CAD, BPPV, HLD, pulm vasc congestion, tobacco use.   Generalized weakness Patient presenting with a few weeks of generalized weakness and decreased appetite in the setting of missing multiple days of HD (3 sessions) and hypoglycemia.  Work-up also notable for hyperkalemia, elevated serum creatinine, and acidosis likely related to the missed HD.  Suspect generalized weakness may be multifactorial with a combination of metabolic derangements resulting from missed dialysis and hypoglycemia in the setting of decreased oral intake as she continues to give her normal amount of long-acting insulin.  At this time, she is clinically stable though on 2 L of supplemental oxygen (no oxygen requirement at home).  Will admit patient for HD and titration of her insulin regimen. - Admit to med surg with Dr. Thompson Grayer attending - Nephro consulted, planned for HD today - AM labs: RFP, CBC - orthostatic vitals - vitals per unit routine  ESRD On HD TThS. Home meds include calcium acetate, renaplex multivitamins. - HD per nephrology - continue home medications - appreciate nephrology involvement  Hypoglycemia  T2DM Home meds include insulin glargine 30 units qHS. She has had decreased oral intake yet is still taking a large amount of long-acting insulin causing her to become  hypoglycemic.  CBG checked during our evaluation which returned at 55, patient asymptomatic and mentating normally.  Patient instructed to eat a snack. - very sensitive SSI - monitor CBG 4 times daily - hypoglycemia protocol  Hyperkalemia Potassium 5.8 on admission, hyperkalemia likely secondary to multiple missed HD sessions.  No peaked T waves on EKG.  Should improve with HD.  Lower extremity wounds  Appears to be secondary to chronic lower extremity edema.  Patient seen in wound care for blisters that have been present for the last few months. She has a f/u appt on 4/8. Reports using cream prescribed on the leg and will have fluid filled blisters that occur overnight. Left lower extremity currently wrapped. She has it wrapped by wound care during her visits. - wound care consult  Hypertension Hypertensive on admission with systolics in the 287O-676H. Regular pulse rate 62-71. Likely due to fluid overload secondary to missing two weeks of hemodialysis. Home meds include carvedilol 25 mg BID, hydralazine 50 mg daily - continue carvedilol, hydralazine  Hypoxemia SpO2 at 84% on room air upon arrival to ED. Placed on 2 L oxygen via nasal cannula, subsequently SpO2 99-100%.  Suspect element of volume overload in the setting of missed dialysis, though no interstitial edema or pleural effusion seen on CXR.  Expect this to improve with HD. - continuous pulse ox - 2L supplemental oxygen, wean as tolerated - SpO2 goal >92%  HFpEF Last echo 2019 with EF 60 to 65%, G1DD.  CAD Home meds of asa 36  - continue home medications  Hyperlipidemia Home med of atorvastatin 40 mg daily - continue home medications  Tobacco Use  Insomnia Home med of mirtazapine 7.5 mg qHS.  - continue home  medications  GERD Home med of pantoprazole 40 mg daily.  - continue home medications   FEN/GI: renal/carb-modified diet   Prophylaxis: heparin subcutaneous   Disposition: med-surg  History of Present  Illness:  Kathleen [name] is a 75 y.o. female presenting with weakness in setting of 1 week of missed HD sessions.   Patient reports that she has been having generalized weakness and decreased appetite for the past few weeks. She states she was evaluated by her PCP yesterday and was told that her BG was very low so they recommended that she eat something soon after her visit. She reports that she came to the hospital today due to feeling weak and she did not have the energy to get out of her recliner. She states that her body was shaking and she felt unsafe. She states she did not go to HD because "I just did not feel like going". She was planning to go to her session today but felt so weak, she came to the hospital instead. She reports regular session attendance prior to the missed week.   She reports taking less than 30 (20-25 units) units of Lantus due to decreased appetite and eating less food recently, though this past week she has been taking the full 30 units of Lantus. She reports she has not been checking her blood glucose due to not having blood glucose meter for the last month (family member accidentally threw it away).    She ambulates using walker  In the ED, she was intermittently hypoxemic her was put on 2 L of nasal cannula, no baseline oxygen requirement at home.  Labs notable for hyperkalemia (K 5.8), glucose 61, creatinine 8.83, pH 7.21 on VBG.  EKG obtained without any peaked T waves.  CXR obtained notable for cardiomegaly, no evidence of interstitial edema or pleural effusion.  Nephrology was consulted in the ED, recommending nonemergent HD.  Review Of Systems: Per HPI with the following additions:   Review of Systems  Constitutional: Positive for appetite change. Negative for chills and fever.  HENT: Negative for congestion and sore throat.   Respiratory: Positive for cough and shortness of breath. Negative for chest tightness.        Dry cough  Cardiovascular: Positive for  leg swelling. Negative for chest pain.  Gastrointestinal: Negative for abdominal pain, diarrhea, nausea and vomiting.  Genitourinary: Negative for dysuria.  Skin: Positive for wound.  Neurological: Positive for dizziness, weakness and light-headedness. Negative for syncope.     Patient Active Problem List   Diagnosis Date Noted  . Pulmonary edema 01/29/2019  . Hyperkalemia 01/29/2019  . Acute on chronic diastolic CHF (congestive heart failure) ([place]) 01/29/2019  . Acute respiratory failure with hypoxia ([place]) 01/29/2019  . Hematochezia   . Benign neoplasm of cecum   . Benign neoplasm of ascending colon   . Benign neoplasm of sigmoid colon   . Gastritis and gastroduodenitis   . Rectal bleeding 09/01/2018  . CVA (cerebral vascular accident) ([place]) 11/09/2017  . Acute CVA (cerebrovascular accident) ([place]) 11/08/2017  . DM (diabetes mellitus), type 2 with renal complications ([place]) 93/26/7124  . Altered mental status 07/14/2017  . Knee pain   . Acute lower UTI   . Peripheral arterial disease ([place]) 02/23/2016  . Insulin dependent diabetes mellitus 12/18/2015  . Anemia in chronic kidney disease 12/18/2015  . Dyspnea   . Goals of care, counseling/discussion   . SOB (shortness of breath)   . Encounter for palliative care   .  Pulmonary vascular congestion   . Pleural effusion, left   . HLD (hyperlipidemia)   . Noncompliance with medication regimen   . Disorientation   . Somnolence   . Acute respiratory failure with hypercapnia ([place])   . Respiratory acidosis   . Hypernatremia   . Systolic and diastolic CHF, acute ([place])   . CHF, acute on chronic ([place]) 08/05/2015  . Elevated troponin 08/05/2015  . CHF (congestive heart failure) ([place]) 08/05/2015  . Acute on chronic heart failure ([place]) 08/05/2015  . Acute on chronic combined systolic and diastolic congestive heart failure ([place])   . Left carotid bruit 11/20/2014  . Syncope and collapse 06/10/2014  . Syncope 06/10/2014  . Essential  hypertension   . Unspecified vitamin D deficiency   . Cellulitis and abscess of right lower extremitiy 02/28/2014  . Chronic combined systolic and diastolic heart failure ([place]) 02/12/2014  . CAD (coronary artery disease)   . Obesity   . Meningioma ([place])   . Anemia   . ESRD on dialysis ([place]) 01/24/2014  . Hyperlipidemia 01/22/2014  . BPPV (benign paroxysmal positional vertigo) 01/22/2014  . Hypertensive urgency 01/21/2014  . Tobacco abuse 01/21/2014  . Diabetes mellitus due to abnormal insulin ([place]) 01/21/2014    Past Medical History: Past Medical History:  Diagnosis Date  . Anemia    a. 01/2014: suspected of chronic disease, negative FOBT.  [name]    knees  . Asthma    many years ago  . CAD (coronary artery disease)    a. Cath 01/2014: mild in LAD, mild-mod LCx, mod-severe RCA - for med rx.  . Diabetes mellitus without complication ([place])   . ESRD (end stage renal disease) ([place])    [place]  . History of echocardiogram    Echo (10/15):  Mod LVH, EF 50-55%, Gr 1 DD, MAC, mild MR, mild TR, PASP 35 mmHg  . Hx of transfusion   . Hyperlipidemia   . Hypertension   . Meningioma ([place])    a. Incidental dx 01/2014.  . Obesity   . Renal insufficiency   . Systolic CHF ([place])    a. 08/6107: dx with mixed ICM/NICM (out of proportion to CAD) EF 25-30% by echo.     Past Surgical History: Past Surgical History:  Procedure Laterality Date  . ABDOMINAL HYSTERECTOMY    . AV FISTULA PLACEMENT Left 02/10/2017   Procedure: ARTERIOVENOUS (AV) FISTULA CREATION-LEFT FOREARM;  Surgeon: Elam Dutch, MD;  Location: [facility];  Service: Vascular;  Laterality: Left;  . BIOPSY  09/04/2018   Procedure: BIOPSY;  Surgeon: Jerene Bears, MD;  Location: [facility] ENDOSCOPY;  Service: Gastroenterology;;  . BREAST SURGERY Right    Lumpectomy  . COLONOSCOPY    . COLONOSCOPY WITH PROPOFOL N/A 09/04/2018   Procedure: COLONOSCOPY WITH PROPOFOL;  Surgeon: Jerene Bears, MD;  Location: [facility];   Service: Gastroenterology;  Laterality: N/A;  . ESOPHAGOGASTRODUODENOSCOPY (EGD) WITH PROPOFOL N/A 09/04/2018   Procedure: ESOPHAGOGASTRODUODENOSCOPY (EGD) WITH PROPOFOL;  Surgeon: Jerene Bears, MD;  Location: [name] ENDOSCOPY;  Service: Gastroenterology;  Laterality: N/A;  . FISTULA SUPERFICIALIZATION Left 08/09/2017   Procedure: FISTULA SUPERFICIALIZATION LEFT ARM ARTERIOVENOUS FISTULA;  Surgeon: Conrad Moraine, MD;  Location: [facility];  Service: Vascular;  Laterality: Left;  . INSERTION OF DIALYSIS CATHETER Right 02/10/2017   Procedure: INSERTION OF DIALYSIS CATHETER;  Surgeon: Elam Dutch, MD;  Location: [facility];  Service: Vascular;  Laterality: Right;  . LEFT HEART CATHETERIZATION WITH CORONARY ANGIOGRAM N/A 01/27/2014  Procedure: LEFT HEART CATHETERIZATION WITH CORONARY ANGIOGRAM;  Surgeon: Jettie Booze, MD;  Location: [facility] CATH LAB;  Service: Cardiovascular;  Laterality: N/A;  . POLYPECTOMY  09/04/2018   Procedure: POLYPECTOMY;  Surgeon: Jerene Bears, MD;  Location: [facility] ENDOSCOPY;  Service: Gastroenterology;;  . TONSILLECTOMY      Social History: Social History   Tobacco Use  . Smoking status: Former Smoker    Packs/day: 0.25    Years: 30.00    Pack years: 7.50    Types: Cigarettes  . Smokeless tobacco: Never Used  . Tobacco comment: quit late 1990's  Vaping Use  . Vaping Use: Never used  Substance Use Topics  . Alcohol use: No  . Drug use: No   Family History: Family History  Problem Relation Age of Onset  . Diabetic kidney disease Mother   . Hypertension Mother   . Heart failure Mother   . Heart attack Mother   . Hyperlipidemia Mother   . Hypertension Father     Allergies and Medications: Allergies  Allergen Reactions  . Baclofen Other (See Comments)    Severe delirium when given to pateint in Dec 2018.   . Chlorhexidine Rash   No current facility-administered medications on file prior to encounter.   Current Outpatient Medications on File Prior to Encounter   Medication Sig Dispense Refill  . acetaminophen (TYLENOL) 325 MG tablet Take 2 tablets (650 mg total) by mouth every 4 (four) hours as needed for mild pain (or temp > 37.5 C (99.5 F)). (Patient taking differently: Take 650 mg by mouth 2 (two) times daily.)    . aspirin EC 81 MG tablet Take 81 mg by mouth daily. Swallow whole.    [name] atorvastatin (LIPITOR) 40 MG tablet Take 1 tablet (40 mg total) by mouth daily at 6 PM. 30 tablet 0  . calcium acetate (PHOSLO) 667 MG capsule Take 1 capsule (667 mg total) by mouth as needed (with snacks). (Patient taking differently: Take 667-2,001 mg by mouth See admin instructions. Take 3 capsules (2001 mg totally) by mouth 3 times daily with a meal; Take 1 tablet (667 mg totally) by mouth 2 times daily with snacks) 60 capsule 0  . carvedilol (COREG) 25 MG tablet Take 25 mg by mouth 2 (two) times daily.  3  . hydrALAZINE (APRESOLINE) 25 MG tablet Take 1 tablet (25 mg total) by mouth every 8 (eight) hours as needed (SBP > 170  or DBP > 110). (Patient taking differently: Take 50 mg by mouth daily.) 90 tablet 0  . Insulin Glargine (BASAGLAR KWIKPEN) 100 UNIT/ML Inject 30 Units into the skin at bedtime.    . mirtazapine (REMERON) 7.5 MG tablet Take 7.5 mg by mouth at bedtime.    . Multiple Vitamins-Minerals (RENAPLEX) TABS Take 1 tablet by mouth daily.    . pantoprazole (PROTONIX) 40 MG tablet Take 1 tablet (40 mg total) by mouth 2 (two) times daily before a meal. Take 1 tablet twice daily for a  Month then once dailly (Patient taking differently: Take 40 mg by mouth daily.) 90 tablet 1  . polyethylene glycol (MIRALAX / GLYCOLAX) packet Take 17 g by mouth daily. (Patient taking differently: Take 17 g by mouth daily as needed for mild constipation.) 14 each 0  . triamcinolone cream (KENALOG) 0.5 % Apply 1 application topically 2 (two) times daily as needed for rash.    . brimonidine-timolol (COMBIGAN) 0.2-0.5 % ophthalmic solution Place 1 drop into both eyes every 12 (twelve)  hours.    [name]  insulin detemir (LEVEMIR) 100 UNIT/ML injection Inject 0.15 mLs (15 Units total) into the skin at bedtime. (Patient not taking: No sig reported) 10 mL 11    Objective: BP (!) 162/103 (BP Location: Right Arm)   Pulse 62   Temp 97.8 F (36.6 C) (Oral)   Resp 18   Ht _0  (1.6 m)   LMP  (LMP Unknown)   SpO2 100%   BMI 33.13 kg/m   Exam: General: pleasant,conversant, no acute distress  Eyes: PERRL, EOMI, no scleral icterus, senile arcus ENTM: dry MM  Neck: no LAD  Cardiovascular: RRR, no murmurs, bilateral radial pulses, 2+ pitting edema up to bilateral knees Respiratory: CTAB, breathing comfortably on [place] Gastrointestinal: soft, nontender, +BS MSK: no deformity Derm: left lower extremity wrapped, right lower extremity with chronic stasis dermatitis changes with scattered weeping blisters Neuro: alert, interactive, full strength in all extremities Psych: affect appropriate  Right lower extremity    Labs and Imaging: CBC BMET  Recent Labs  Lab 11/28/2020 1110 11/18/2020 1210  WBC 5.1  --   HGB 12.6 14.6  HCT 43.3 43.0  PLT 193  --    Recent Labs  Lab 12/03/2020 1110 12/01/2020 1210  NA 139 138  K 5.8* 5.6*  CL 98  --   CO2 31  --   BUN 55*  --   CREATININE 8.83*  --   GLUCOSE 61*  --   CALCIUM 9.3  --      EKG: NSR, QTc 478, no ST segment elevation  PORTABLE CHEST 1 VIEW 11/19/2020 COMPARISON:  04/11/2020 FINDINGS: No frank interstitial edema. Mild right basilar opacity, likely scarring/atelectasis. No focal consolidation. No pleural effusion or pneumothorax. Cardiomegaly. IMPRESSION: Cardiomegaly.  No frank interstitial edema.  Zola Button, MD 11/29/2020, 3:22 PM PGY-1, [facility] pager: [phone], text pages welcome

## 2020-11-10 NOTE — H&P (Signed)
[facility] Admission History and Physical Service Pager: [phone]  Patient name: Kathleen [name] Medical record number: [id number] Date of birth: April 06, 1946 Age: 75 y.o. Gender: female  Primary Care Provider: Benito Mccreedy, MD Consultants: Nephrology Code Status: Full Code Preferred Emergency Contact: [name], Son, [phone]  Chief Complaint: Weakness  Assessment and Plan: Kathleen [name] is a 75 y.o. female presenting with weakness and hypoglycemia in the setting of decreased appetite and missing one week of dialysis. PMH is significant for ESRD on HD T/Th/Sa, CHF, acute CVA, T2DM, CAD, BPPV, HLD, pulm vasc congestion, tobacco use.   Generalized weakness likely 2/2 to respiratory acidosis in setting of missed HD  Patient presenting with a few weeks of generalized weakness and decreased appetite in the setting of missing multiple days of HD (3 sessions) and hypoglycemia.  Work-up also notable for hyperkalemia, elevated serum creatinine, and acidosis with pH 7.2 and CO2 77, bicarb 31, O2 80.  Suspect generalized weakness may be multifactorial with a combination of metabolic derangements resulting from missed dialysis and hypoglycemia in the setting of decreased oral intake as she continues to take near normal amount of long-acting insulin.  At this time, she is clinically stable though on 2 L of supplemental oxygen (no oxygen requirement at home).  Will admit patient for HD and titration of her insulin regimen. - Admit to med surg with Dr. Owens Shark attending - Nephro consulted, planned for HD today - AM labs: RFP, CBC - repeat venous blood gas  - orthostatic vitals - vitals per unit routine - PT/OT - Tylenol PRN  - progressive mobility  - ambulates with walker at baseline    ESRD on HD  On HD TThS. Home meds include calcium acetate, renaplex multivitamins. Three missed sessions as noted above. K 5.8 on admission. - HD per nephrology - continue  home medications - appreciate nephrology involvement  T2DM  Hypoglycemia Home meds include insulin glargine 30 units qHS. She has had decreased oral intake yet is still taking a large amount of long-acting insulin (reported 25 units) causing her to become hypoglycemic. Metabolic panel on admission 61. CBG checked during our evaluation was 55, patient asymptomatic and mentating normally. Patient reports she has not been able to check BG at home due to losing glucose meter for the last month.  - counseled patient on increasing PO intake - very sensitive SSI - monitor CBG 4 times daily - hypoglycemia protocol - glucerna    Lower extremity wounds  Appears to be secondary to chronic venous stasis changes.  Patient seen in wound care for blisters that have been present for the last few months. She has a f/u appt on 4/8. Reports using prescription cream on LLE. She reports development of new fluid filled blisters that seem to occur overnight on the RLE. Left lower extremity currently wrapped. She has it wrapped by wound care during her visits. See images in media. No current signs of infection, no warmth to touch or erythema, no leukocytosis.  - wound care consult  Hypertension Hypertensive on admission with systolics in the 676P-950D. Regular pulse rate 62-71. Likely due to fluid overload secondary to missing two weeks of hemodialysis. Home meds include carvedilol 25 mg BID, hydralazine 50 mg daily. Denies HA on admission, denies CP.  - continue carvedilol, hydralazine - monitor BP with vitals   Hypoxemia SpO2 at 84% on room air upon arrival to ED. Placed on 2 L oxygen via nasal cannula, subsequently SpO2 99-100%.  Suspect  element of volume overload (although paradoxical given low PO intake) in the setting of missed dialysis, though no interstitial edema or pleural effusion seen on CXR.  Expect this to improve with HD. Patient reports no home O2 use but intermittently requires oxygen during HD  sessions.  - continuous pulse ox - 2L supplemental oxygen, wean as tolerated - SpO2 goal >92%  HFpEF Last echo 2019 with EF 60 to 65%, G1DD. - continue home meds as detailed above   CAD Home meds of asa 81  - continue home medications  Hyperlipidemia Home med of atorvastatin 40 mg daily - continue home medications  Insomnia Home med of mirtazapine 7.5 mg qHS.  - continue home medications  GERD Home med of pantoprazole 40 mg daily.  - continue home medications   FEN/GI: renal/carb-modified diet   Prophylaxis: heparin subcutaneous   Disposition: med-surg  History of Present Illness:  Kathleen [name] is a 75 y.o. female presenting with weakness in setting of 1 week of missed HD sessions.   Patient reports that she has been having generalized weakness and decreased appetite for the past few weeks. She states she was evaluated by her PCP yesterday and was told that her BG was very low so they recommended that she eat something soon after her visit. She reports that she came to the hospital today due to feeling weak and she did not have the energy to get out of her recliner. She states that her body was shaking and she felt unsafe. She states she did not go to HD because "I just did not feel like going". She was planning to go to her session today but felt so weak, she came to the hospital instead. She reports regular session attendance prior to the missed week.   She reports taking less than 30 (20-25 units) units of Lantus due to decreased appetite and eating less food recently, though this past week she has been taking the full 30 units of Lantus. She reports she has not been checking her blood glucose due to not having blood glucose meter for the last month (family member accidentally threw it away).    She ambulates using walker  In the ED, she was intermittently hypoxemic her was put on 2 L of nasal cannula, no baseline oxygen requirement at home.  Labs notable for  hyperkalemia (K 5.8), glucose 61, creatinine 8.83, pH 7.21 on VBG.  EKG obtained without any peaked T waves.  CXR obtained notable for cardiomegaly, no evidence of interstitial edema or pleural effusion.  Nephrology was consulted in the ED, recommending nonemergent HD.  Review Of Systems: Per HPI with the following additions:   Review of Systems  Constitutional: Positive for appetite change. Negative for chills and fever.  HENT: Negative for congestion and sore throat.   Respiratory: Positive for cough and shortness of breath. Negative for chest tightness.        Dry cough  Cardiovascular: Positive for leg swelling. Negative for chest pain.  Gastrointestinal: Negative for abdominal pain, diarrhea, nausea and vomiting.  Genitourinary: Negative for dysuria.  Skin: Positive for wound.  Neurological: Positive for dizziness, weakness and light-headedness. Negative for syncope.     Patient Active Problem List   Diagnosis Date Noted  . Pulmonary edema 01/29/2019  . Hyperkalemia 01/29/2019  . Acute on chronic diastolic CHF (congestive heart failure) ([place]) 01/29/2019  . Acute respiratory failure with hypoxia ([place]) 01/29/2019  . Hematochezia   . Benign neoplasm of cecum   .  Benign neoplasm of ascending colon   . Benign neoplasm of sigmoid colon   . Gastritis and gastroduodenitis   . Rectal bleeding 09/01/2018  . CVA (cerebral vascular accident) ([place]) 11/09/2017  . Acute CVA (cerebrovascular accident) ([place]) 11/08/2017  . DM (diabetes mellitus), type 2 with renal complications ([place]) 61/95/0932  . Altered mental status 07/14/2017  . Knee pain   . Acute lower UTI   . Peripheral arterial disease ([place]) 02/23/2016  . Insulin dependent diabetes mellitus 12/18/2015  . Anemia in chronic kidney disease 12/18/2015  . Dyspnea   . Goals of care, counseling/discussion   . SOB (shortness of breath)   . Encounter for palliative care   . Pulmonary vascular congestion   . Pleural effusion, left   . HLD  (hyperlipidemia)   . Noncompliance with medication regimen   . Disorientation   . Somnolence   . Acute respiratory failure with hypercapnia ([place])   . Respiratory acidosis   . Hypernatremia   . Systolic and diastolic CHF, acute ([place])   . CHF, acute on chronic ([place]) 08/05/2015  . Elevated troponin 08/05/2015  . CHF (congestive heart failure) ([place]) 08/05/2015  . Acute on chronic heart failure ([place]) 08/05/2015  . Acute on chronic combined systolic and diastolic congestive heart failure ([place])   . Left carotid bruit 11/20/2014  . Syncope and collapse 06/10/2014  . Syncope 06/10/2014  . Essential hypertension   . Unspecified vitamin D deficiency   . Cellulitis and abscess of right lower extremitiy 02/28/2014  . Chronic combined systolic and diastolic heart failure ([place]) 02/12/2014  . CAD (coronary artery disease)   . Obesity   . Meningioma ([place])   . Anemia   . ESRD on dialysis ([place]) 01/24/2014  . Hyperlipidemia 01/22/2014  . BPPV (benign paroxysmal positional vertigo) 01/22/2014  . Hypertensive urgency 01/21/2014  . Tobacco abuse 01/21/2014  . Diabetes mellitus due to abnormal insulin ([place]) 01/21/2014    Past Medical History: Past Medical History:  Diagnosis Date  . Anemia    a. 01/2014: suspected of chronic disease, negative FOBT.  [name]    knees  . Asthma    many years ago  . CAD (coronary artery disease)    a. Cath 01/2014: mild in LAD, mild-mod LCx, mod-severe RCA - for med rx.  . Diabetes mellitus without complication ([place])   . ESRD (end stage renal disease) ([place])    [place]  . History of echocardiogram    Echo (10/15):  Mod LVH, EF 50-55%, Gr 1 DD, MAC, mild MR, mild TR, PASP 35 mmHg  . Hx of transfusion   . Hyperlipidemia   . Hypertension   . Meningioma ([place])    a. Incidental dx 01/2014.  . Obesity   . Renal insufficiency   . Systolic CHF ([place])    a. 01/7123: dx with mixed ICM/NICM (out of proportion to CAD) EF 25-30% by echo.     Past  Surgical History: Past Surgical History:  Procedure Laterality Date  . ABDOMINAL HYSTERECTOMY    . AV FISTULA PLACEMENT Left 02/10/2017   Procedure: ARTERIOVENOUS (AV) FISTULA CREATION-LEFT FOREARM;  Surgeon: Elam Dutch, MD;  Location: [facility];  Service: Vascular;  Laterality: Left;  . BIOPSY  09/04/2018   Procedure: BIOPSY;  Surgeon: Jerene Bears, MD;  Location[facility] ENDOSCOPY;  Service: Gastroenterology;;  . BREAST SURGERY Right    Lumpectomy  . COLONOSCOPY    . COLONOSCOPY WITH PROPOFOL N/A 09/04/2018   Procedure: COLONOSCOPY WITH PROPOFOL;  Surgeon: Jerene Bears, MD;  Location: [facility] ENDOSCOPY;  Service: Gastroenterology;  Laterality: N/A;  . ESOPHAGOGASTRODUODENOSCOPY (EGD) WITH PROPOFOL N/A 09/04/2018   Procedure: ESOPHAGOGASTRODUODENOSCOPY (EGD) WITH PROPOFOL;  Surgeon: Jerene Bears, MD;  Location: [facility] ENDOSCOPY;  Service: Gastroenterology;  Laterality: N/A;  . FISTULA SUPERFICIALIZATION Left 08/09/2017   Procedure: FISTULA SUPERFICIALIZATION LEFT ARM ARTERIOVENOUS FISTULA;  Surgeon: Conrad Andrews, MD;  Location: [facility];  Service: Vascular;  Laterality: Left;  . INSERTION OF DIALYSIS CATHETER Right 02/10/2017   Procedure: INSERTION OF DIALYSIS CATHETER;  Surgeon: Elam Dutch, MD;  Location: [facility];  Service: Vascular;  Laterality: Right;  . LEFT HEART CATHETERIZATION WITH CORONARY ANGIOGRAM N/A 01/27/2014   Procedure: LEFT HEART CATHETERIZATION WITH CORONARY ANGIOGRAM;  Surgeon: Jettie Booze, MD;  Location[facility] CATH LAB;  Service: Cardiovascular;  Laterality: N/A;  . POLYPECTOMY  09/04/2018   Procedure: POLYPECTOMY;  Surgeon: Jerene Bears, MD;  Location: [facility] ENDOSCOPY;  Service: Gastroenterology;;  . TONSILLECTOMY      Social History: Social History   Tobacco Use  . Smoking status: Former Smoker    Packs/day: 0.25    Years: 30.00    Pack years: 7.50    Types: Cigarettes  . Smokeless tobacco: Never Used  . Tobacco comment: quit late 1990's  Vaping Use  . Vaping Use: Never  used  Substance Use Topics  . Alcohol use: No  . Drug use: No   Family History: Family History  Problem Relation Age of Onset  . Diabetic kidney disease Mother   . Hypertension Mother   . Heart failure Mother   . Heart attack Mother   . Hyperlipidemia Mother   . Hypertension Father     Allergies and Medications: Allergies  Allergen Reactions  . Baclofen Other (See Comments)    Severe delirium when given to pateint in Dec 2018.   . Chlorhexidine Rash   No current facility-administered medications on file prior to encounter.   Current Outpatient Medications on File Prior to Encounter  Medication Sig Dispense Refill  . acetaminophen (TYLENOL) 325 MG tablet Take 2 tablets (650 mg total) by mouth every 4 (four) hours as needed for mild pain (or temp > 37.5 C (99.5 F)). (Patient taking differently: Take 650 mg by mouth 2 (two) times daily.)    . aspirin EC 81 MG tablet Take 81 mg by mouth daily. Swallow whole.    [name] atorvastatin (LIPITOR) 40 MG tablet Take 1 tablet (40 mg total) by mouth daily at 6 PM. 30 tablet 0  . calcium acetate (PHOSLO) 667 MG capsule Take 1 capsule (667 mg total) by mouth as needed (with snacks). (Patient taking differently: Take 667-2,001 mg by mouth See admin instructions. Take 3 capsules (2001 mg totally) by mouth 3 times daily with a meal; Take 1 tablet (667 mg totally) by mouth 2 times daily with snacks) 60 capsule 0  . carvedilol (COREG) 25 MG tablet Take 25 mg by mouth 2 (two) times daily.  3  . hydrALAZINE (APRESOLINE) 25 MG tablet Take 1 tablet (25 mg total) by mouth every 8 (eight) hours as needed (SBP > 170  or DBP > 110). (Patient taking differently: Take 50 mg by mouth daily.) 90 tablet 0  . Insulin Glargine (BASAGLAR KWIKPEN) 100 UNIT/ML Inject 30 Units into the skin at bedtime.    . mirtazapine (REMERON) 7.5 MG tablet Take 7.5 mg by mouth at bedtime.    . Multiple Vitamins-Minerals (RENAPLEX) TABS Take 1 tablet by mouth  daily.    . pantoprazole  (PROTONIX) 40 MG tablet Take 1 tablet (40 mg total) by mouth 2 (two) times daily before a meal. Take 1 tablet twice daily for a  Month then once dailly (Patient taking differently: Take 40 mg by mouth daily.) 90 tablet 1  . polyethylene glycol (MIRALAX / GLYCOLAX) packet Take 17 g by mouth daily. (Patient taking differently: Take 17 g by mouth daily as needed for mild constipation.) 14 each 0  . triamcinolone cream (KENALOG) 0.5 % Apply 1 application topically 2 (two) times daily as needed for rash.    . brimonidine-timolol (COMBIGAN) 0.2-0.5 % ophthalmic solution Place 1 drop into both eyes every 12 (twelve) hours.    . insulin detemir (LEVEMIR) 100 UNIT/ML injection Inject 0.15 mLs (15 Units total) into the skin at bedtime. (Patient not taking: No sig reported) 10 mL 11    Objective: BP (!) 162/103 (BP Location: Right Arm)   Pulse 62   Temp 97.8 F (36.6 C) (Oral)   Resp 18   Ht _0  (1.6 m)   LMP  (LMP Unknown)   SpO2 100%   BMI 33.13 kg/m   Exam: General: pleasant,conversant, no acute distress  Eyes: PERRL, EOMI, no scleral icterus, senile arcus ENTM: dry MM  Neck: no LAD  Cardiovascular: RRR, no murmurs, bilateral radial pulses, 2+ pitting edema up to bilateral knees Respiratory: CTAB, breathing comfortably on [place] Gastrointestinal: soft, nontender, +BS MSK: no deformity Derm: left lower extremity wrapped, right lower extremity with chronic stasis dermatitis changes with scattered weeping blisters Neuro: alert, interactive, full strength in all extremities Psych: affect appropriate  Right lower extremity    Labs and Imaging: CBC BMET  Recent Labs  Lab 11/16/2020 1110 12/04/2020 1210  WBC 5.1  --   HGB 12.6 14.6  HCT 43.3 43.0  PLT 193  --    Recent Labs  Lab 11/09/2020 1110 11/06/2020 1210  NA 139 138  K 5.8* 5.6*  CL 98  --   CO2 31  --   BUN 55*  --   CREATININE 8.83*  --   GLUCOSE 61*  --   CALCIUM 9.3  --      EKG: NSR, QTc 478, no ST segment  elevation  PORTABLE CHEST 1 VIEW 11/12/2020 COMPARISON:  04/11/2020 FINDINGS: No frank interstitial edema. Mild right basilar opacity, likely scarring/atelectasis. No focal consolidation. No pleural effusion or pneumothorax. Cardiomegaly. IMPRESSION: Cardiomegaly.  No frank interstitial edema.  Zola Button, MD 11/06/2020, 3:22 PM PGY-1, [place] Intern pager: [phone], text pages welcome   FPTS Upper-Level Resident Addendum   I have independently interviewed and examined the patient. I have discussed the above with the original author and agree with their documentation. My edits for correction/addition/clarification are included above. Please see any attending notes.   Eulis Foster, MD PGY-2, [facility] 11/26/2020 10:36 PM  FPTS Service pager: [phone] (text pages welcome through [place])

## 2020-11-10 NOTE — ED Notes (Signed)
Pt given three OJ to help with CBG

## 2020-11-11 ENCOUNTER — Other Ambulatory Visit: Payer: Self-pay

## 2020-11-11 DIAGNOSIS — I953 Hypotension of hemodialysis: Secondary | ICD-10-CM | POA: Diagnosis not present

## 2020-11-11 DIAGNOSIS — Z515 Encounter for palliative care: Secondary | ICD-10-CM | POA: Diagnosis not present

## 2020-11-11 DIAGNOSIS — E162 Hypoglycemia, unspecified: Secondary | ICD-10-CM | POA: Diagnosis not present

## 2020-11-11 DIAGNOSIS — E1169 Type 2 diabetes mellitus with other specified complication: Secondary | ICD-10-CM | POA: Diagnosis present

## 2020-11-11 DIAGNOSIS — I517 Cardiomegaly: Secondary | ICD-10-CM | POA: Diagnosis not present

## 2020-11-11 DIAGNOSIS — I132 Hypertensive heart and chronic kidney disease with heart failure and with stage 5 chronic kidney disease, or end stage renal disease: Secondary | ICD-10-CM | POA: Diagnosis not present

## 2020-11-11 DIAGNOSIS — E662 Morbid (severe) obesity with alveolar hypoventilation: Secondary | ICD-10-CM | POA: Diagnosis present

## 2020-11-11 DIAGNOSIS — J9602 Acute respiratory failure with hypercapnia: Secondary | ICD-10-CM | POA: Diagnosis not present

## 2020-11-11 DIAGNOSIS — E1121 Type 2 diabetes mellitus with diabetic nephropathy: Secondary | ICD-10-CM | POA: Diagnosis not present

## 2020-11-11 DIAGNOSIS — I2510 Atherosclerotic heart disease of native coronary artery without angina pectoris: Secondary | ICD-10-CM | POA: Diagnosis not present

## 2020-11-11 DIAGNOSIS — R918 Other nonspecific abnormal finding of lung field: Secondary | ICD-10-CM | POA: Diagnosis not present

## 2020-11-11 DIAGNOSIS — Z992 Dependence on renal dialysis: Secondary | ICD-10-CM | POA: Diagnosis not present

## 2020-11-11 DIAGNOSIS — E1151 Type 2 diabetes mellitus with diabetic peripheral angiopathy without gangrene: Secondary | ICD-10-CM | POA: Diagnosis present

## 2020-11-11 DIAGNOSIS — Z8673 Personal history of transient ischemic attack (TIA), and cerebral infarction without residual deficits: Secondary | ICD-10-CM | POA: Diagnosis not present

## 2020-11-11 DIAGNOSIS — Z0189 Encounter for other specified special examinations: Secondary | ICD-10-CM | POA: Diagnosis not present

## 2020-11-11 DIAGNOSIS — I10 Essential (primary) hypertension: Secondary | ICD-10-CM | POA: Diagnosis not present

## 2020-11-11 DIAGNOSIS — E8889 Other specified metabolic disorders: Secondary | ICD-10-CM | POA: Diagnosis present

## 2020-11-11 DIAGNOSIS — J90 Pleural effusion, not elsewhere classified: Secondary | ICD-10-CM | POA: Diagnosis not present

## 2020-11-11 DIAGNOSIS — I428 Other cardiomyopathies: Secondary | ICD-10-CM | POA: Diagnosis present

## 2020-11-11 DIAGNOSIS — R531 Weakness: Secondary | ICD-10-CM | POA: Diagnosis not present

## 2020-11-11 DIAGNOSIS — J811 Chronic pulmonary edema: Secondary | ICD-10-CM | POA: Diagnosis not present

## 2020-11-11 DIAGNOSIS — R2689 Other abnormalities of gait and mobility: Secondary | ICD-10-CM | POA: Diagnosis not present

## 2020-11-11 DIAGNOSIS — E8779 Other fluid overload: Secondary | ICD-10-CM | POA: Diagnosis present

## 2020-11-11 DIAGNOSIS — I5020 Unspecified systolic (congestive) heart failure: Secondary | ICD-10-CM | POA: Diagnosis not present

## 2020-11-11 DIAGNOSIS — Z7189 Other specified counseling: Secondary | ICD-10-CM | POA: Diagnosis not present

## 2020-11-11 DIAGNOSIS — E871 Hypo-osmolality and hyponatremia: Secondary | ICD-10-CM | POA: Diagnosis not present

## 2020-11-11 DIAGNOSIS — Z66 Do not resuscitate: Secondary | ICD-10-CM | POA: Diagnosis not present

## 2020-11-11 DIAGNOSIS — J9612 Chronic respiratory failure with hypercapnia: Secondary | ICD-10-CM | POA: Diagnosis not present

## 2020-11-11 DIAGNOSIS — G478 Other sleep disorders: Secondary | ICD-10-CM | POA: Diagnosis not present

## 2020-11-11 DIAGNOSIS — R0902 Hypoxemia: Secondary | ICD-10-CM | POA: Diagnosis not present

## 2020-11-11 DIAGNOSIS — Z452 Encounter for adjustment and management of vascular access device: Secondary | ICD-10-CM | POA: Diagnosis not present

## 2020-11-11 DIAGNOSIS — J9601 Acute respiratory failure with hypoxia: Secondary | ICD-10-CM | POA: Diagnosis not present

## 2020-11-11 DIAGNOSIS — R451 Restlessness and agitation: Secondary | ICD-10-CM | POA: Diagnosis not present

## 2020-11-11 DIAGNOSIS — Z20822 Contact with and (suspected) exposure to covid-19: Secondary | ICD-10-CM | POA: Diagnosis present

## 2020-11-11 DIAGNOSIS — E11649 Type 2 diabetes mellitus with hypoglycemia without coma: Secondary | ICD-10-CM | POA: Diagnosis present

## 2020-11-11 DIAGNOSIS — I272 Pulmonary hypertension, unspecified: Secondary | ICD-10-CM | POA: Diagnosis present

## 2020-11-11 DIAGNOSIS — Z9911 Dependence on respirator [ventilator] status: Secondary | ICD-10-CM | POA: Diagnosis not present

## 2020-11-11 DIAGNOSIS — J9621 Acute and chronic respiratory failure with hypoxia: Secondary | ICD-10-CM | POA: Diagnosis present

## 2020-11-11 DIAGNOSIS — J9622 Acute and chronic respiratory failure with hypercapnia: Secondary | ICD-10-CM | POA: Diagnosis present

## 2020-11-11 DIAGNOSIS — Z978 Presence of other specified devices: Secondary | ICD-10-CM | POA: Diagnosis not present

## 2020-11-11 DIAGNOSIS — E1122 Type 2 diabetes mellitus with diabetic chronic kidney disease: Secondary | ICD-10-CM | POA: Diagnosis present

## 2020-11-11 DIAGNOSIS — D631 Anemia in chronic kidney disease: Secondary | ICD-10-CM | POA: Diagnosis present

## 2020-11-11 DIAGNOSIS — I2729 Other secondary pulmonary hypertension: Secondary | ICD-10-CM | POA: Diagnosis not present

## 2020-11-11 DIAGNOSIS — E785 Hyperlipidemia, unspecified: Secondary | ICD-10-CM | POA: Diagnosis present

## 2020-11-11 DIAGNOSIS — N2581 Secondary hyperparathyroidism of renal origin: Secondary | ICD-10-CM | POA: Diagnosis present

## 2020-11-11 DIAGNOSIS — D649 Anemia, unspecified: Secondary | ICD-10-CM | POA: Diagnosis not present

## 2020-11-11 DIAGNOSIS — E1159 Type 2 diabetes mellitus with other circulatory complications: Secondary | ICD-10-CM | POA: Diagnosis not present

## 2020-11-11 DIAGNOSIS — R1110 Vomiting, unspecified: Secondary | ICD-10-CM | POA: Diagnosis not present

## 2020-11-11 DIAGNOSIS — E875 Hyperkalemia: Secondary | ICD-10-CM | POA: Diagnosis not present

## 2020-11-11 DIAGNOSIS — Z23 Encounter for immunization: Secondary | ICD-10-CM | POA: Diagnosis not present

## 2020-11-11 DIAGNOSIS — Z4682 Encounter for fitting and adjustment of non-vascular catheter: Secondary | ICD-10-CM | POA: Diagnosis not present

## 2020-11-11 DIAGNOSIS — I5042 Chronic combined systolic (congestive) and diastolic (congestive) heart failure: Secondary | ICD-10-CM | POA: Diagnosis not present

## 2020-11-11 DIAGNOSIS — J9811 Atelectasis: Secondary | ICD-10-CM | POA: Diagnosis not present

## 2020-11-11 DIAGNOSIS — N186 End stage renal disease: Secondary | ICD-10-CM | POA: Diagnosis not present

## 2020-11-11 DIAGNOSIS — R400 Somnolence: Secondary | ICD-10-CM | POA: Diagnosis not present

## 2020-11-11 DIAGNOSIS — J690 Pneumonitis due to inhalation of food and vomit: Secondary | ICD-10-CM | POA: Diagnosis not present

## 2020-11-11 DIAGNOSIS — I050 Rheumatic mitral stenosis: Secondary | ICD-10-CM | POA: Diagnosis not present

## 2020-11-11 DIAGNOSIS — I5189 Other ill-defined heart diseases: Secondary | ICD-10-CM | POA: Diagnosis not present

## 2020-11-11 DIAGNOSIS — I509 Heart failure, unspecified: Secondary | ICD-10-CM | POA: Diagnosis not present

## 2020-11-11 DIAGNOSIS — I5033 Acute on chronic diastolic (congestive) heart failure: Secondary | ICD-10-CM | POA: Diagnosis present

## 2020-11-11 DIAGNOSIS — G9341 Metabolic encephalopathy: Secondary | ICD-10-CM | POA: Diagnosis not present

## 2020-11-11 DIAGNOSIS — E872 Acidosis: Secondary | ICD-10-CM | POA: Diagnosis present

## 2020-11-11 LAB — BLOOD GAS, VENOUS
Acid-Base Excess: 4.7 mmol/L — ABNORMAL HIGH (ref 0.0–2.0)
Acid-Base Excess: 4.5 mmol/L — ABNORMAL HIGH (ref 0.0–2.0)
Acid-Base Excess: 5 mmol/L — ABNORMAL HIGH (ref 0.0–2.0)
Bicarbonate: 32.1 mmol/L — ABNORMAL HIGH (ref 20.0–28.0)
Bicarbonate: 32.3 mmol/L — ABNORMAL HIGH (ref 20.0–28.0)
Bicarbonate: 32.6 mmol/L — ABNORMAL HIGH (ref 20.0–28.0)
Drawn by: 5604
Drawn by: 5604
Drawn by: 5926
FIO2: 32
FIO2: 40
O2 Saturation: 79.7 %
O2 Saturation: 61 %
O2 Saturation: 73.9 %
Patient temperature: 36.5
Patient temperature: 35.9
Patient temperature: 37
pCO2, Ven: 80.6 mmHg (ref 44.0–60.0)
pCO2, Ven: 84.2 mmHg (ref 44.0–60.0)
pCO2, Ven: 86.5 mmHg (ref 44.0–60.0)
pH, Ven: 7.22 — ABNORMAL LOW (ref 7.250–7.430)
pH, Ven: 7.201 — ABNORMAL LOW (ref 7.250–7.430)
pH, Ven: 7.202 — ABNORMAL LOW (ref 7.250–7.430)
pO2, Ven: 48.1 mmHg — ABNORMAL HIGH (ref 32.0–45.0)
pO2, Ven: 33.4 mmHg (ref 32.0–45.0)
pO2, Ven: 43.4 mmHg (ref 32.0–45.0)

## 2020-11-11 LAB — RENAL FUNCTION PANEL
Albumin: 3 g/dL — ABNORMAL LOW (ref 3.5–5.0)
Anion gap: 12 (ref 5–15)
BUN: 57 mg/dL — ABNORMAL HIGH (ref 8–23)
CO2: 27 mmol/L (ref 22–32)
Calcium: 8.7 mg/dL — ABNORMAL LOW (ref 8.9–10.3)
Chloride: 100 mmol/L (ref 98–111)
Creatinine, Ser: 9.05 mg/dL — ABNORMAL HIGH (ref 0.44–1.00)
GFR, Estimated: 4 mL/min — ABNORMAL LOW (ref >60)
Glucose, Bld: 93 mg/dL (ref 70–99)
Phosphorus: 8.3 mg/dL — ABNORMAL HIGH (ref 2.5–4.6)
Potassium: 6.2 mmol/L — ABNORMAL HIGH (ref 3.5–5.1)
Sodium: 139 mmol/L (ref 135–145)

## 2020-11-11 LAB — BLOOD GAS, ARTERIAL
Acid-Base Excess: 4.4 mmol/L — ABNORMAL HIGH (ref 0.0–2.0)
Bicarbonate: 31.4 mmol/L — ABNORMAL HIGH (ref 20.0–28.0)
Drawn by: 336832
FIO2: 32
O2 Saturation: 96.4 %
Patient temperature: 37
pCO2 arterial: 77.1 mmHg (ref 32.0–48.0)
pH, Arterial: 7.234 — ABNORMAL LOW (ref 7.350–7.450)
pO2, Arterial: 90.5 mmHg (ref 83.0–108.0)

## 2020-11-11 LAB — CBC
HCT: 39.9 % (ref 36.0–46.0)
Hemoglobin: 11.5 g/dL — ABNORMAL LOW (ref 12.0–15.0)
MCH: 31 pg (ref 26.0–34.0)
MCHC: 28.8 g/dL — ABNORMAL LOW (ref 30.0–36.0)
MCV: 107.5 fL — ABNORMAL HIGH (ref 80.0–100.0)
Platelets: 154 10*3/uL (ref 150–400)
RBC: 3.71 MIL/uL — ABNORMAL LOW (ref 3.87–5.11)
RDW: 15 % (ref 11.5–15.5)
WBC: 4.8 10*3/uL (ref 4.0–10.5)
nRBC: 0 % (ref 0.0–0.2)

## 2020-11-11 LAB — GLUCOSE, CAPILLARY
Glucose-Capillary: 88 mg/dL (ref 70–99)
Glucose-Capillary: 70 mg/dL (ref 70–99)
Glucose-Capillary: 124 mg/dL — ABNORMAL HIGH (ref 70–99)
Glucose-Capillary: 178 mg/dL — ABNORMAL HIGH (ref 70–99)

## 2020-11-11 LAB — HEMOGLOBIN A1C
Hgb A1c MFr Bld: 8 % — ABNORMAL HIGH (ref 4.8–5.6)
Mean Plasma Glucose: 182.9 mg/dL

## 2020-11-11 LAB — BASIC METABOLIC PANEL
Anion gap: 5 (ref 5–15)
BUN: 17 mg/dL (ref 8–23)
CO2: 32 mmol/L (ref 22–32)
Calcium: 7.6 mg/dL — ABNORMAL LOW (ref 8.9–10.3)
Chloride: 99 mmol/L (ref 98–111)
Creatinine, Ser: 4.58 mg/dL — ABNORMAL HIGH (ref 0.44–1.00)
GFR, Estimated: 9 mL/min — ABNORMAL LOW (ref >60)
Glucose, Bld: 82 mg/dL (ref 70–99)
Potassium: 4.3 mmol/L (ref 3.5–5.1)
Sodium: 136 mmol/L (ref 135–145)

## 2020-11-11 LAB — SALICYLATE LEVEL: Salicylate Lvl: <7 mg/dL — ABNORMAL LOW (ref 7.0–30.0)

## 2020-11-11 LAB — HEPATITIS B SURFACE ANTIGEN: Hepatitis B Surface Ag: NONREACTIVE

## 2020-11-11 LAB — LACTIC ACID, PLASMA: Lactic Acid, Venous: 0.9 mmol/L (ref 0.5–1.9)

## 2020-11-11 LAB — ACETAMINOPHEN LEVEL: Acetaminophen (Tylenol), Serum: <10 ug/mL — ABNORMAL LOW (ref 10–30)

## 2020-11-11 MED ORDER — PENTAFLUOROPROP-TETRAFLUOROETH EX AERO: 1.0000 "application " | INHALATION_SPRAY | CUTANEOUS | Status: DC | PRN

## 2020-11-11 MED ORDER — SODIUM CHLORIDE 0.9 % IV SOLN: 100.0000 mL | INTRAVENOUS | Status: DC | PRN

## 2020-11-11 MED ORDER — LIDOCAINE-PRILOCAINE 2.5-2.5 % EX CREA
1.0000 "application " | TOPICAL_CREAM | CUTANEOUS | Status: DC | PRN
  Filled 2020-11-11: qty 5

## 2020-11-11 MED ORDER — HEPARIN SODIUM (PORCINE) 1000 UNIT/ML DIALYSIS
1000.0000 [IU] | INTRAMUSCULAR | Status: DC | PRN
  Filled 2020-11-11: qty 1

## 2020-11-11 MED ORDER — LIDOCAINE HCL (PF) 1 % IJ SOLN: 5.0000 mL | INTRAMUSCULAR | Status: DC | PRN

## 2020-11-11 MED ORDER — ACETAMINOPHEN 325 MG PO TABS
ORAL_TABLET | ORAL | Status: AC
  Administered 2020-11-11: 650 mg via ORAL
  Filled 2020-11-11: qty 2

## 2020-11-11 NOTE — Progress Notes (Signed)
Family Medicine Teaching Service Daily Progress Note Intern Pager: [phone]  Patient name: Kathleen [name] Medical record number: [id number] Date of birth: [birth year] Age: 75 y.o. Gender: female  Primary Care Provider: Benito Mccreedy, MD Consultants: Nephrology Code Status: Full code  Pt Overview and Major Events to Date:  4/5 admitted  Assessment and Plan: [name] is a 75 year old female presenting with weakness and hyperglycemia after having missed 1 week of dialysis.  PMH significant for ESRD on HD T/TH/SA, CHF, acute CVA, T2DM, CAD, BPPV, HLD, pulmonary vascular congestion, tobacco use.  Generalized weakness likely 2/2 to respiratory acidosis in setting of missed HD  Awaiting repeat BMP midmorning.  Patient appears more awake and alert this morning. -Nephro consulted, appreciate ongoing care -BiPAP for now with repeat VBG after 4 hours of BiPAP therapy -Vitals per unit routine -PT OT eval and treat -Tylenol as needed -Progressive mobility -Ambulates with walker at baseline  ESRD on HD T/Th/Sa Received dialysis early morning 4/6.  Will now resume normal schedule. -Nephrology consulted, appreciate ongoing care -Continue home calcium acetate, reflux multivitamins  T2DM  Hypoglycemia Admission hemoglobin A1c 8.0.  Hypoglycemic on admission with CBG 55-61, improved overnight.  Fasting glucose this morning 93.  Hyperglycemia on admission likely due to decreased p.o. intake with normal insulin regimen. -Increase p.o. intake -Very sensitive sliding scale insulin -CBG 4 times daily -Hypoglycemia protocol  Lower extremity wounds  Currently bandaged, did not remove bandages to inspect.  Patient reports them feeling okay, no pain at this moment. -Wound care consulted, appreciate recommendations  Hypertension  HFpEF Hypertensive on arrival, improved with HD early this morning (4/6).  BPs this morning with systolics 42P to 536R, last 95/54. -Continue home carvedilol,  hydralazine -Vitals patient routinely  Hypoxemia Patient currently on 3 L O2 via [place] with SPO2 98-99%.  Wean oxygen as tolerated. -Continuous pulse ox -Submental oxygen, wean as tolerated -SPO2 goal > 94%  CAD Continue home aspirin.  Hyperlipidemia Continue home atorvastatin 40 mg daily.  Insomnia Continue home mirtazapine 7.5 mg nightly.  GERD Continue home pantoprazole 40 mg daily.  FEN/GI: Renal core modified diet PPx: Heparin subcu   Status is: Observation  The patient remains OBS appropriate and will d/c before 2 midnights.  Dispo: The patient is from: Home              Anticipated d/c is to: Home              Patient currently is not medically stable to d/c.   Difficult to place patient No  Subjective:  Patient awake and laying in bed.  Appears much improved from admission.  Patient chipper and in good mood.  Has no complaints right now.  Objective: Temp:  [97.7 F (36.5 C)-98.2 F (36.8 C)] 97.8 F (36.6 C) (04/06 0441) Pulse Rate:  [62-96] 72 (04/06 0441) Resp:  [15-22] 18 (04/06 0441) BP: (86-175)/(38-116) 95/54 (04/06 0441) SpO2:  [84 %-100 %] 98 % (04/06 0441) Weight:  [87.2 kg-88.7 kg] 87.2 kg (04/06 0400) Physical Exam: General: Awake, alert, no acute distress Cardiovascular: Regular rate and rhythm, no murmurs Respiratory: CTA B Abdomen: Soft, nondistended, no TTP Extremities: No BLE edema, bandages in place  Laboratory: Recent Labs  Lab 11/19/2020 1110 11/30/2020 1210 11/11/20 0018  WBC 5.1  --  4.8  HGB 12.6 14.6 11.5*  HCT 43.3 43.0 39.9  PLT 193  --  154   Recent Labs  Lab 11/26/2020 1110 12/02/2020 1210 11/11/20 0017  NA 139 138 139  K 5.8* 5.6* 6.2*  CL 98  --  100  CO2 31  --  27  BUN 55*  --  57*  CREATININE 8.83*  --  9.05*  CALCIUM 9.3  --  8.7*  PROT 6.8  --   --   BILITOT 0.4  --   --   ALKPHOS 66  --   --   ALT 19  --   --   AST 23  --   --   GLUCOSE 61*  --  93    Imaging/Diagnostic Tests: PORTABLE CHEST 1  VIEW 11/24/2020 COMPARISON:  04/11/2020 FINDINGS: No frank interstitial edema. Mild right basilar opacity, likely scarring/atelectasis. No focal consolidation. No pleural effusion or pneumothorax Cardiomegaly. IMPRESSION: Cardiomegaly.  No frank interstitial edema    Kathleen Essex, MD 11/11/2020, 8:32 AM PGY-1, [place] Intern pager: [phone], text pages welcome

## 2020-11-11 NOTE — Progress Notes (Signed)
FPTS Interim Progress Note  S: Went to evaluate patient along with Dr. Jeannine Kitten, given multiple VBGs consistent with acidosis that has not improved. Nurse states that although BiPAP not currently on patient has decided to wear it soon, she just needed a break for a few minutes. Dr. Jeannine Kitten stressed importance of BiPAP compliance, patient agrees and understands. She denies further concerns at this time.  O: BP (!) 144/52 (BP Location: Right Arm)   Pulse 66   Temp 98.1 F (36.7 C) (Oral)   Resp 18   Ht 5\' 3"  (1.6 m)   Wt 87.2 kg   LMP  (LMP Unknown)   SpO2 99%   BMI 34.05 kg/m   General: Patient sitting upright in bed watching television, in no acute distress. BiPAP not in place.  CV: RRR, no murmurs auscultated Resp: CTAB, no wheezing noted, breathing comfortably on 3L O2 Neuro: alert and answering questions appropriately  A/P: -BiPAP throughout the night -repeat VBG ordered  Donney Dice, DO 11/11/2020, 8:50 PM PGY-1, [place] Medicine Service pager [phone]

## 2020-11-11 NOTE — Progress Notes (Signed)
Pt placed on bipap  

## 2020-11-11 NOTE — Progress Notes (Signed)
Patient's VBG returned with worsening acidosis, hypoxemia, and hypercapnia.  Called pt's nurse to let her know we were going to order bipap and she stated pt was about to be transported to HD soon.  Will reassess patient and get another VBG after the patient returns from HD and if not improved will order bipap and transfer to progressive floor.

## 2020-11-11 NOTE — Evaluation (Signed)
Physical Therapy Evaluation Patient Details Name: Kathleen [name] MRN: [id number] DOB: 01/14/1946 Today's Date: 11/11/2020   History of Present Illness  Ms. Knoll is a 75 year old female presenting with weakness and hyperglycemia after having missed 1 week of dialysis.  PMH significant for ESRD on HD T/TH/SA, CHF, acute CVA, T2DM, CAD, BPPV, HLD, pulmonary vascular congestion, tobacco use.  Clinical Impression   Pt admitted with above diagnosis. Llives at home with her son and grand children and was Ind with ADLs/selfcare and used a RW for mobility. Presents to PT with generalized weakness, decr activity tolerance, Cardiopulmonary status limiting mobility;  Pt currently requires mod - max A with bed mobility with increased time and effort to sit EOB; Max assist for functional transfers; Unable to come to stand with just one person assist; Pt currently with functional limitations due to the deficits listed below (see PT Problem List). Pt will benefit from skilled PT to increase their independence and safety with mobility to allow discharge to the venue listed below.       Follow Up Recommendations SNF    Equipment Recommendations  Rolling walker with 5" wheels;3in1 (PT);Wheelchair (measurements PT);Wheelchair cushion (measurements PT)    Recommendations for Other Services       Precautions / Restrictions Precautions Precautions: Fall Restrictions Weight Bearing Restrictions: No      Mobility  Bed Mobility Overal bed mobility: Needs Assistance Bed Mobility: Rolling;Supine to Sit;Sit to Supine Rolling: Mod assist   Supine to sit: Mod assist Sit to supine: Max assist   General bed mobility comments: cues for safety, hand placement to use rail, pt required mod A with LEs off EOB and trunk elevation, max A with LEs back onto bed and total A +2 to scoot to [facility] with bed tilted back    Transfers Overall transfer level: Needs assistance   Transfers: Lateral/Scoot Transfers           Lateral/Scoot Transfers: Max assist General transfer comment: Simulated scoot transfers, scooting to [facility] with bed pad and max asssist  Ambulation/Gait                Stairs            Wheelchair Mobility    Modified Rankin (Stroke Patients Only)       Balance Overall balance assessment: Needs assistance Sitting-balance support: No upper extremity supported Sitting balance-Leahy Scale: Fair Sitting balance - Comments: posterior lean during dynamic sitting tasks                                     Pertinent Vitals/Pain Pain Assessment: No/denies pain    Home Living Family/patient expects to be discharged to:: Private residence Living Arrangements: Children Available Help at Discharge: Family;Available PRN/intermittently Type of Home: House Home Access: Stairs to enter Entrance Stairs-Rails: None Entrance Stairs-Number of Steps: 1 Home Layout: One level Home Equipment: Walker - 2 wheels;Bedside commode;Cane - single point;Shower seat;Shower seat - built in Additional Comments: pt reports that her son and daughter can help some, but not 24/7    Prior Function Level of Independence: Independent with assistive device(s)         Comments: Ind with ADLs/selfcare, uses RW for mobility     Hand Dominance   Dominant Hand: Right    Extremity/Trunk Assessment   Upper Extremity Assessment Upper Extremity Assessment: Defer to OT evaluation RUE Deficits / Details: shoulder flexion impaired (chronic from  arthritis per pt) LUE Deficits / Details: shoulder flexion impaired (chronic from arthritis per pt)    Lower Extremity Assessment Lower Extremity Assessment: Generalized weakness (with noted venous stasis ulcers)       Communication   Communication: No difficulties  Cognition Arousal/Alertness: Awake/alert Behavior During Therapy: WFL for tasks assessed/performed Overall Cognitive Status: No family/caregiver present to determine baseline  cognitive functioning                                 General Comments: pt with Poor insight into deficits      General Comments General comments (skin integrity, edema, etc.): A portion of seesion conducted on Room Air (she is not typically needing supplemental O2), and her O2 sats decr to 84% observed lowest; Restatrted supplemental O2 and sats incr back to 90s with controlled breathing    Exercises     Assessment/Plan    PT Assessment Patient needs continued PT services  PT Problem List Decreased strength;Decreased range of motion;Decreased activity tolerance;Decreased balance;Decreased mobility;Decreased coordination;Decreased cognition;Decreased knowledge of use of DME;Decreased safety awareness;Decreased knowledge of precautions;Cardiopulmonary status limiting activity       PT Treatment Interventions DME instruction;Gait training;Functional mobility training;Therapeutic activities;Therapeutic exercise;Neuromuscular re-education;Patient/family education    PT Goals (Current goals can be found in the Care Plan section)  Acute Rehab PT Goals Patient Stated Goal: get better and go home PT Goal Formulation: With patient Time For Goal Achievement: November 29, 2020 Potential to Achieve Goals: Good    Frequency Min 2X/week   Barriers to discharge Decreased caregiver support Has near 24 hour assist, but not quite -- will need to be independent to dc home    Co-evaluation               AM-PAC PT "6 Clicks" Mobility  Outcome Measure Help needed turning from your back to your side while in a flat bed without using bedrails?: A Lot Help needed moving from lying on your back to sitting on the side of a flat bed without using bedrails?: A Lot Help needed moving to and from a bed to a chair (including a wheelchair)?: A Lot Help needed standing up from a chair using your arms (e.g., wheelchair or bedside chair)?: Total Help needed to walk in hospital room?: Total Help  needed climbing 3-5 steps with a railing? : Total 6 Click Score: 9    End of Session Equipment Utilized During Treatment: Oxygen (bed pad) Activity Tolerance: Patient tolerated treatment well Patient left: in bed;with nursing/sitter in room (preparing to move to [place]) Nurse Communication: Mobility status PT Visit Diagnosis: Other abnormalities of gait and mobility (R26.89);Muscle weakness (generalized) (M62.81)    Time: 6967-8938 PT Time Calculation (min) (ACUTE ONLY): 14 min   Charges:   PT Evaluation $PT Eval Moderate Complexity: [place], [place]  Acute Rehabilitation Services Pager [phone] Office [phone]   Colletta [place] 11/11/2020, 2:00 PM

## 2020-11-11 NOTE — Progress Notes (Signed)
CRITICAL VALUE STICKER  CRITICAL VALUE: PCO2 86.5  DATE & TIME NOTIFIED: 2202  MD NOTIFIED: Larae Grooms  TIME OF NOTIFICATION: 2216  RESPONSE:  No new orders.

## 2020-11-11 NOTE — Progress Notes (Signed)
Pt arrived to the unit. Transfer complete. Pt sitting up eating lunch no resp. distress.

## 2020-11-11 NOTE — Progress Notes (Signed)
Per bed placement, awaiting for a progressive room to become available. During that time, pt placed on bipap through dreamstation, rapid response notified and placed pt on telemetry. Pt stable at this time and responsive to voice. Vitals WNL, ventilation improved via dreamstation. Will change to V60 on admission to progressive floor. RRT will continue to monitor.

## 2020-11-11 NOTE — Consult Note (Signed)
WOC Nurse Consult Note: Patient receiving care in [facility] [phone]. Reason for Consult: BLE wounds; followed by Dr. Dellia Nims at the [facility]. Last seen 11/06/20 in the center. Wound type: chronic, likely related to both venous insufficiency and a PAD component. Pressure Injury POA: Yes/No/NA Measurement: Wound bed: see photos Drainage (amount, consistency, odor)  Periwound: Dressing procedure/placement/frequency: Gently wash BLE with soap and water. Pat dry. Place Aquacel Kellie Simmering [phone]) over each intact blister, and over each open wound. Beginning behind the toes and going to just below the knees, spiral wrap kerlex, then 4 inch ace wrap. Perform daily.  Patient will need to resume care in the outpatient [facility] at time of discharge. [place] nurse will not follow at this time.  Please re-consult the [place] team if needed.  Val Riles, RN, MSN, CWOCN, CNS-BC, pager [phone]

## 2020-11-11 NOTE — Progress Notes (Addendum)
PCo2 84 placed on bipap

## 2020-11-11 NOTE — Progress Notes (Signed)
Pt has taken BIpap off several times and is refusing to put it back on despite several attempts. MD notified.

## 2020-11-11 NOTE — Evaluation (Signed)
Occupational Therapy Evaluation Patient Details Name: Kathleen [name] MRN: [id number] DOB: [birth year] Today's Date: 11/11/2020    History of Present Illness Ms. Habermehl is a 75 year old female presenting with weakness and hyperglycemia after having missed 1 week of dialysis.  PMH significant for ESRD on HD T/TH/SA, CHF, acute CVA, T2DM, CAD, BPPV, HLD, pulmonary vascular congestion, tobacco use.   Clinical Impression   Pt presents with decline inf function and safety with ADLs and ADL mobility with impaired strength, balance and endurance. PTA pt lived at home with her son and grand children and was Ind with ADLs/selfcare and used a RW for mobility. Pt currently requires mod - max A with bed mobility with increased time and effort to sit EOB, min A with UB ADLs, max - total A with Lb ADLs, total A with toileting and was unable to stand from EOB with +1 assist to attempt SPTs. Pt would benefit from acute OT services to address impairments to maximize level of function and safety    Follow Up Recommendations  SNF    Equipment Recommendations  Other (comment) (TBD at venue of care)    Recommendations for Other Services       Precautions / Restrictions Precautions Precautions: Fall Restrictions Weight Bearing Restrictions: No      Mobility Bed Mobility Overal bed mobility: Needs Assistance Bed Mobility: Rolling;Supine to Sit;Sit to Supine Rolling: Mod assist   Supine to sit: Mod assist Sit to supine: Max assist   General bed mobility comments: cues for safety, hand placement to use rail, pt required mod A with LEs off EOB and trunk elevation, max A with LEs back onto bed and total A +2 to scoot to [facility] with bed tilted back    Transfers                 General transfer comment: unable, will need +2 or Stedy    Balance Overall balance assessment: Needs assistance Sitting-balance support: No upper extremity supported Sitting balance-Leahy Scale: Fair Sitting balance -  Comments: posterior lean during dynamic sitting tasks                                   ADL either performed or assessed with clinical judgement   ADL Overall ADL's : Needs assistance/impaired Eating/Feeding: Set up;Sitting   Grooming: Wash/dry hands;Wash/dry face;Min guard;Sitting   Upper Body Bathing: Minimal assistance;Sitting   Lower Body Bathing: Maximal assistance   Upper Body Dressing : Minimal assistance;Sitting   Lower Body Dressing: Total assistance     Toilet Transfer Details (indicate cue type and reason): unable to SPT from EOB, will need +2 assist or Stedy possibly Toileting- Clothing Manipulation and Hygiene: Total assistance;Bed level               Vision Patient Visual Report: No change from baseline       Perception     Praxis      Pertinent Vitals/Pain Pain Assessment: No/denies pain     Hand Dominance Right   Extremity/Trunk Assessment Upper Extremity Assessment Upper Extremity Assessment: Generalized weakness;RUE deficits/detail;LUE deficits/detail RUE Deficits / Details: shoulder flexion impaired (chronic from arthritis per pt) LUE Deficits / Details: shoulder flexion impaired (chronic from arthritis per pt)   Lower Extremity Assessment Lower Extremity Assessment: Defer to PT evaluation       Communication Communication Communication: No difficulties   Cognition Arousal/Alertness: Awake/alert Behavior During Therapy: [facility] for tasks  assessed/performed Overall Cognitive Status: No family/caregiver present to determine baseline cognitive functioning                                 General Comments: pt with Poor insight into deficits   General Comments       Exercises     Shoulder Instructions      Home Living Family/patient expects to be discharged to:: Private residence Living Arrangements: Children Available Help at Discharge: Family;Available PRN/intermittently Type of Home: House Home Access:  Stairs to enter [employer] of Steps: 1 Entrance Stairs-Rails: None Home Layout: One level     Bathroom Shower/Tub: Occupational psychologist: Handicapped height     Home Equipment: Environmental consultant - 2 wheels;Bedside commode;Cane - single point;Shower seat;Shower seat - built in   Additional Comments: pt reports that her son and daughter can help some, but not 24/7      Prior Functioning/Environment Level of Independence: Independent with assistive device(s)        Comments: Ind with ADLs/selfcare, uses RW for mobility        OT Problem List: Decreased strength;Impaired balance (sitting and/or standing);Decreased safety awareness;Decreased activity tolerance;Decreased coordination;Decreased knowledge of use of DME or AE      OT Treatment/Interventions: Self-care/ADL training;Therapeutic exercise;Patient/family education;Balance training;Therapeutic activities;DME and/or AE instruction    OT Goals(Current goals can be found in the care plan section) Acute Rehab OT Goals Patient Stated Goal: get better and go home OT Goal Formulation: With patient Time For Goal Achievement: 2020-11-29 Potential to Achieve Goals: Good ADL Goals Pt Will Perform Grooming: with supervision;with set-up;sitting Pt Will Perform Upper Body Bathing: with min guard assist;sitting Pt Will Perform Lower Body Bathing: with mod assist;sitting/lateral leans Pt Will Perform Upper Body Dressing: with min guard assist;sitting Pt Will Transfer to Toilet: with max assist;with mod assist;stand pivot transfer;bedside commode  OT Frequency: Min 2X/week   Barriers to D/C:            Co-evaluation              AM-PAC OT "6 Clicks" Daily Activity     Outcome Measure Help from another person eating meals?: None Help from another person taking care of personal grooming?: A Little Help from another person toileting, which includes using toliet, bedpan, or urinal?: Total Help from another person  bathing (including washing, rinsing, drying)?: A Lot Help from another person to put on and taking off regular upper body clothing?: A Little Help from another person to put on and taking off regular lower body clothing?: Total 6 Click Score: 14   End of Session    Activity Tolerance: Patient limited by fatigue Patient left: in bed;with call bell/phone within reach;with bed alarm set;Other (comment) (Plebotomist in to see pt)  OT Visit Diagnosis: Other abnormalities of gait and mobility (R26.89);Muscle weakness (generalized) (M62.81)                Time: 3833-3832 OT Time Calculation (min): 26 min Charges:  OT General Charges $OT Visit: 1 Visit OT Evaluation $OT Eval Moderate Complexity: 1 Mod OT Treatments $Self Care/Home Management : 8-22 mins    Britt Bottom 11/11/2020, 12:12 PM

## 2020-11-11 NOTE — Progress Notes (Signed)
FPTS Interim Progress Note  Notified by nurse that patient refusing to wear BiPAP, has only worn it for an hour thus far. Went to bedside to talk to patient. Upon my arrival, she did not have the BiPAP in place. She reports that it is just too uncomfortable and she cannot sleep. Had extensive discussion about importance of keeping it on. Patient AOx4 and breathing comfortably on 3L O2. She denies dyspnea or other associated symptoms. Encouraged her to at least try to wear it on as much as possible to correct her acidosis. Patient agrees to trying this throughout the night. Discussed this with nurse as well.   Donney Dice, DO 11/11/2020, 11:49 PM PGY-1, [place] [facility] Service pager [phone]

## 2020-11-11 NOTE — Progress Notes (Signed)
[facility] Progress Note   Subjective:  Seen in room. More cheerful today. Still using O2, but denies dyspnea or CP.  Objective Vitals:   11/11/20 0345 11/11/20 0400 11/11/20 0441 11/11/20 0922  BP: (!) 99/38 113/62 (!) 95/54 (!) 119/52  Pulse: 76 96 72 69  Resp: 18 18 18 17   Temp:  98.2 F (36.8 C) 97.8 F (36.6 C) 98.1 F (36.7 C)  TempSrc:  Oral Oral   SpO2:  99% 98% 99%  Weight:  87.2 kg    Height:       Physical Exam General: Elderly woman, NAD Heart: RRR; no murmur Lungs: CTA in upper lobes, dull bases Abdomen: soft, non-tender Extremities: 1+ pitting BLE; RLE now bandaged as well (wound care has seen her) Dialysis Access: LUE AVF + bruit  Additional Objective Labs: Basic Metabolic Panel: Recent Labs  Lab 11/09/2020 1110 11/18/2020 1210 11/11/20 0017  NA 139 138 139  K 5.8* 5.6* 6.2*  CL 98  --  100  CO2 31  --  27  GLUCOSE 61*  --  93  BUN 55*  --  57*  CREATININE 8.83*  --  9.05*  CALCIUM 9.3  --  8.7*  PHOS  --   --  8.3*   Liver Function Tests: Recent Labs  Lab 11/20/2020 1110 11/11/20 0017  AST 23  --   ALT 19  --   ALKPHOS 66  --   BILITOT 0.4  --   PROT 6.8  --   ALBUMIN 3.5 3.0*   Recent Labs  Lab 11/11/2020 1110  LIPASE 33   CBC: Recent Labs  Lab 11/19/2020 1110 11/24/2020 1210 11/11/20 0018  WBC 5.1  --  4.8  NEUTROABS 3.3  --   --   HGB 12.6 14.6 11.5*  HCT 43.3 43.0 39.9  MCV 106.4*  --  107.5*  PLT 193  --  154   CBG: Recent Labs  Lab 11/28/2020 1614 11/09/2020 1917 11/18/2020 2106 11/11/20 0858  GLUCAP 55* 60* 118* 88   Studies/Results: DG Chest Port 1 View  Result Date: 11/11/2020 CLINICAL DATA:  Weakness, nausea EXAM: PORTABLE CHEST 1 VIEW COMPARISON:  04/11/2020 FINDINGS: No frank interstitial edema. Mild right basilar opacity, likely scarring/atelectasis. No focal consolidation. No pleural effusion or pneumothorax. Cardiomegaly. IMPRESSION: Cardiomegaly.  No frank interstitial edema. Electronically Signed    By: Julian Hy M.D.   On: 11/07/2020 12:34   Medications: . sodium chloride    . sodium chloride     . aspirin EC  81 mg Oral Daily  . atorvastatin  40 mg Oral q1800  . calcium acetate  2,001 mg Oral TID with meals  . carvedilol  25 mg Oral BID  . heparin  5,000 Units Subcutaneous Q8H  . hydrALAZINE  50 mg Oral Daily  . insulin aspart  0-6 Units Subcutaneous TID WC  . mirtazapine  7.5 mg Oral QHS  . multivitamin with minerals  1 tablet Oral Daily  . pantoprazole  40 mg Oral Daily    Dialysis Orders: TTS at [facility] -- last HD 4/2, prior to that last was 3/24 4hr, 350/500, EDW 82kg, 2K/2Ca, UFP #4, AVF, no heparin - Hectoral 85mcg IV q HD  Assessment/Plan: 1.  Weakness: Likely multifactorial in setting of mild overload/hyperkalemia, ?uremia, and ongoing leg wounds. Denies new meds - she is unclear what all is taking @ home. Per primary. 2.  Hyperkalemia: K 6.2 from early this AM - was dialyzed afterwards overnight. 3.  ESRD: Usual TTS schedule, has missed a lot of HD recently. Next HD 4/7. 4.  Hypertension/volume overload: BP high with volume excess, needs ongoing UF. 5.  Anemia: Hgb 11.5 - no ESA need at this time. 6.  Metabolic bone disease: Ca ok, Phos high - Phoslo resumed, follow labs. 7.  T2DM: Hypoglycemia initially, now eating better. A1c 8%. Per primary. 8.  PAD/leg wounds: ischemic/stasis dermatitis per WOC notes. 9.  CAD/HFrEF   Veneta Penton, PA-C 11/11/2020, 9:23 AM  [facility]

## 2020-11-12 DIAGNOSIS — N186 End stage renal disease: Secondary | ICD-10-CM | POA: Diagnosis not present

## 2020-11-12 DIAGNOSIS — J9612 Chronic respiratory failure with hypercapnia: Secondary | ICD-10-CM | POA: Diagnosis not present

## 2020-11-12 DIAGNOSIS — Z0189 Encounter for other specified special examinations: Secondary | ICD-10-CM

## 2020-11-12 DIAGNOSIS — R451 Restlessness and agitation: Secondary | ICD-10-CM | POA: Diagnosis not present

## 2020-11-12 DIAGNOSIS — R531 Weakness: Secondary | ICD-10-CM | POA: Diagnosis not present

## 2020-11-12 LAB — BASIC METABOLIC PANEL
Anion gap: 6 (ref 5–15)
BUN: 27 mg/dL — ABNORMAL HIGH (ref 8–23)
CO2: 31 mmol/L (ref 22–32)
Calcium: 8.2 mg/dL — ABNORMAL LOW (ref 8.9–10.3)
Chloride: 96 mmol/L — ABNORMAL LOW (ref 98–111)
Creatinine, Ser: 5.85 mg/dL — ABNORMAL HIGH (ref 0.44–1.00)
GFR, Estimated: 7 mL/min — ABNORMAL LOW (ref >60)
Glucose, Bld: 160 mg/dL — ABNORMAL HIGH (ref 70–99)
Potassium: 4.7 mmol/L (ref 3.5–5.1)
Sodium: 133 mmol/L — ABNORMAL LOW (ref 135–145)

## 2020-11-12 LAB — CBC
HCT: 37.7 % (ref 36.0–46.0)
Hemoglobin: 10.9 g/dL — ABNORMAL LOW (ref 12.0–15.0)
MCH: 31.4 pg (ref 26.0–34.0)
MCHC: 28.9 g/dL — ABNORMAL LOW (ref 30.0–36.0)
MCV: 108.6 fL — ABNORMAL HIGH (ref 80.0–100.0)
Platelets: 152 10*3/uL (ref 150–400)
RBC: 3.47 MIL/uL — ABNORMAL LOW (ref 3.87–5.11)
RDW: 14.5 % (ref 11.5–15.5)
WBC: 5.1 10*3/uL (ref 4.0–10.5)
nRBC: 0 % (ref 0.0–0.2)

## 2020-11-12 LAB — GLUCOSE, CAPILLARY
Glucose-Capillary: 131 mg/dL — ABNORMAL HIGH (ref 70–99)
Glucose-Capillary: 84 mg/dL (ref 70–99)
Glucose-Capillary: 94 mg/dL (ref 70–99)
Glucose-Capillary: 99 mg/dL (ref 70–99)

## 2020-11-12 LAB — HEPATITIS B SURFACE ANTIBODY, QUANTITATIVE: Hep B S AB Quant (Post): 10.5 m[IU]/mL (ref >9.9)

## 2020-11-12 LAB — HEPATITIS B SURFACE ANTIBODY,QUALITATIVE: Hep B S Ab: REACTIVE — AB

## 2020-11-12 LAB — HEPATITIS B SURFACE ANTIGEN: Hepatitis B Surface Ag: NONREACTIVE

## 2020-11-12 MED ORDER — ALBUMIN HUMAN 25 % IV SOLN
INTRAVENOUS | Status: AC
  Administered 2020-11-12: 12.5 g via INTRAVENOUS
  Filled 2020-11-12: qty 100

## 2020-11-12 MED ORDER — ALBUMIN HUMAN 25 % IV SOLN: 12.5000 g | Freq: Once | INTRAVENOUS | Status: AC

## 2020-11-12 MED ORDER — RENA-VITE PO TABS
1.0000 | ORAL_TABLET | Freq: Every day | ORAL | Status: DC
  Administered 2020-11-13: 1 via ORAL
  Filled 2020-11-12 (×3): qty 1

## 2020-11-12 MED ORDER — PROSOURCE PLUS PO LIQD
30.0000 mL | Freq: Two times a day (BID) | ORAL | Status: DC
  Administered 2020-11-14: 30 mL via ORAL
  Filled 2020-11-12 (×5): qty 30

## 2020-11-12 MED ORDER — ALBUMIN HUMAN 25 % IV SOLN
12.5000 g | Freq: Once | INTRAVENOUS | Status: AC
  Administered 2020-11-12: 12.5 g via INTRAVENOUS

## 2020-11-12 MED ORDER — PROCHLORPERAZINE MALEATE 5 MG PO TABS
5.0000 mg | ORAL_TABLET | Freq: Three times a day (TID) | ORAL | Status: DC | PRN
  Filled 2020-11-12: qty 1

## 2020-11-12 NOTE — Progress Notes (Signed)
Initial Nutrition Assessment  DOCUMENTATION CODES:   Obesity unspecified  INTERVENTION:   -Continue renal MVI daily -30 ml Prosource Plus BID, each supplement provides 100 kcals and 15 grams protein  NUTRITION DIAGNOSIS:   Increased nutrient needs related to chronic illness (ESRD on HD) as evidenced by estimated needs.  GOAL:   Patient will meet greater than or equal to 90% of their needs  MONITOR:   PO intake,Supplement acceptance,Diet advancement,Labs,Weight trends,Skin,I & O's  REASON FOR ASSESSMENT:   Malnutrition Screening Tool    ASSESSMENT:   [name] is a pleasant 75 year old female with a past medical history of ESRD on hemodialysis, type 2 diabetes mellitus, CAD, CHF (last echo 11/25/2017 with EF 60 to 65% and grade 1 diastolic dysfunction), hypertension, peripheral arterial disease, CVA who presents with gradual onset of 2 weeks of weakness, she notes she has not gone to dialysis in the past week.  Pt admitted with generalized weakness secondary acute respiratory failure due to missed HD.   Reviewed I/O's: +300 ml x 24 hours and -1.1 L since admission  Pt unavailable at time of visit. Unable to obtain further nutrition-related history at this time or complete nutrition-focused physical exam.   Per doc flow sheets, meal completions 75%. Pt with increased nutritional needs for wound healing and ESRD on HD and would greatly benefit from addition of oral nutrition supplements.   Per [facility] notes, pt with chronic BLE wounds related to venous insufficiency and PAD.   Per nephrology notes, EDW 82 kg. Pt missed 3 HD sessions PTA. Reviewed wt hx; noted slight wt gain over the past 6 months, likely related to volume overload from missed HD sessions.   Medications reviewed and include remeron and phoslo.   Lab Results  Component Value Date   HGBA1C 8.0 (H) 11/11/2020   PTA DM medications are none.   Labs reviewed: Na: 133, CBGS: 131-178 (inpatient orders for  glycemic control are none).   Diet Order:   Diet Order            Diet renal/carb modified with fluid restriction Diet-HS Snack? Nothing; Fluid restriction: 1200 mL Fluid; Room service appropriate? Yes; Fluid consistency: Thin  Diet effective now                 EDUCATION NEEDS:   No education needs have been identified at this time  Skin:  Skin Assessment: Skin Integrity Issues: Skin Integrity Issues:: Other (Comment) Other: chronic BLE wounds related to veous insufficiency and PAD  Last BM:  11/11/20  Height:   Ht Readings from Last 1 Encounters:  11/30/2020 5\' 3"  (1.6 m)    Weight:   Wt Readings from Last 1 Encounters:  11/12/20 87.1 kg    Ideal Body Weight:  52.3 kg  BMI:  Body mass index is 34.01 kg/m.  Estimated Nutritional Needs:   Kcal:  2050-2250  Protein:  105-120 grams  Fluid:  1000 ml + UOP    Loistine Chance, RD, LDN, [place] Registered Dietitian II Certified Diabetes Care and Education Specialist Please refer to [facility] for RD and/or RD on-call/weekend/after hours pager

## 2020-11-12 NOTE — Consult Note (Addendum)
Mental Capacity Assessment: I have evaluated the following areas to assess the Kathleen [name]'s mental capacity regarding medical decision-making ability which pertains to competency to accept or refuse medical treatment.   The specific treatment or service in question is: refusing oxygen and dialysis treatment and wanting to go home.  Communication: The patient was able to clearly state preferred treatment options for her medical care at this time. The patient was able to decide that she does want dialysis treatment, however this fluctuates with her moods and willingness to continue on with life." ". Regarding her dialysis and oxygen treatment, she states that she gets angry and irritable sometimes and that is why she refuses the treatment but she understands the consequences.  Regarding her dialysis and oxygen treatment, she is willing to continue treatment now.  Understanding: The patient was able to recall information, link causal relationships, and process general probabilities regarding life situations and medical treatment scenarios " ". She was able to paraphrase her view of the current situation and her thoughts about it including (or "but not including) future-oriented scenarios ".  Patient states she knows that if she refuses treatment she can go in "CODE BLUE".  She states she just feels irritable and upset sometimes and refuses treatment.  She states sometimes "I am just pissed and what happens happens".  Patient states that if she goes home she can call ambulance if her health deteriorates.    Appreciation: The patient was able to identify and describe her various illnesses and treatment options with potential outcomes.  Patient states she understands what happens if she refuses treatment and she can go in code blue.  The patient did not present with concerns such as denial or delusional thought-process.  She states she has kidney disease for which she needs dialysis, and lower extremity  wounds for which she needs bandages.   Rationalization: The patient was able to weigh risks and benefits and come to a conclusion congruent with patient's perceived goals. Concerns regarding this category are: depression, acute/chronic encephalopathy, electrolyte imbalances,  In conclusion, the patient is-not experiencing an acute medical scenario: Recent refusal of dialysis treatment which could compromise her mental capacity, especially with her underlying (ESRD).   Conclusion: Pt is irritable at the base line which requires the staff to willing to work with her at this time. Pt agrees to treatment at this time but gets irritable and refuses treatment when she gets upset. At this time, there is insufficient evidence to warrant removal of the patient's rights for medical decision-making. She can clearly determine mental capacity for decision-making due to presence of underlying acute transient etiology (ESRD 2/t uncontrolled DMII).  At this time, we can determine that the patient does have functional mental capacity for medical decision-making including the right to accept or refuse dialysis treatment or oxygen. Patient has appreciation of the fact that she may be harmed if she refuses treatment or dialysis.   A long discussion was had with patient regarding her medical complaints and her prognosis if she chooses not to engage in dialysis or treatment for her depression.  Patient expresses an understanding of both her medical condition and the proposed plan. Patient has an appreciation of the fact that she may be harmed if she refuses dialysis or oxygen.  Patient is able to reason with writer and explain why she has made her decision the way she has made it.  For these reasons writer feels that patient has capacity at this point in time to  participate in treatment.

## 2020-11-12 NOTE — TOC Initial Note (Signed)
Transition of Care [facility]) - Initial/Assessment Note    Patient Details  Name: Kathleen [name] MRN: [id number] Date of Birth: [birth year]  Transition of [facility]) CM/SW Contact:    [name] Phone Number: 11/12/2020, 4:09 PM  Clinical Narrative:                 3:45pm- CSW spoke with pt and son [name] in room) she is oriented and agreeable to SNF. She has requested [facility], CSW contacted Kathie at [facility] will take pt and will begin her insurance auth, she also needs covid vaccinations and booster information. [name] suggested contacted pt other son (kevin) who,lives with pt. CSW contacted [name] there was no answer VM was left. CSW will follow up.  Expected Discharge Plan: Skilled Nursing Facility Barriers to Discharge: Continued Medical Work up   Patient Goals and CMS Choice Patient states their goals for this hospitalization and ongoing recovery are:: Rehab CMS Medicare.gov Compare Post Acute Care list provided to:: Patient Choice offered to / list presented to : [place]  Expected Discharge Plan and Services Expected Discharge Plan: [place] Acute Care Choice: [place] Living arrangements for the past 2 months: Single Family Home                                      Prior Living Arrangements/Services Living arrangements for the past 2 months: Single Family Home Lives with:: Adult [place] Patient language and need for interpreter reviewed:: No Do you feel safe going back to the place where you live?: Yes      Need for Family Participation in Patient Care: Yes (Comment) Care giver support system in place?: Yes (comment)   Criminal Activity/Legal Involvement Pertinent to Current Situation/Hospitalization: No - Comment as needed  Activities of Daily Living Home Assistive Devices/Equipment: CBG Meter,Wheelchair,Walker (specify type) ADL Screening (condition at time of admission) Patient's cognitive  ability adequate to safely complete daily activities?: Yes Is the patient deaf or have difficulty hearing?: No Does the patient have difficulty seeing, even when wearing glasses/contacts?: No Does the patient have difficulty concentrating, remembering, or making decisions?: No Patient able to express need for assistance with ADLs?: Yes Does the patient have difficulty dressing or bathing?: Yes Independently performs ADLs?: No Walks in Home: Needs assistance Does the patient have difficulty walking or climbing stairs?: Yes Weakness of Legs: Both Weakness of Arms/Hands: None  Permission Sought/Granted Permission sought to share information with : Family Supports Permission granted to share information with : Yes, Verbal Permission Granted  Share Information with NAME: Corrine Tillis     Permission granted to share info w Relationship: Son  Permission granted to share info w Contact Information: [phone]  Emotional Assessment Appearance:: Appears stated age Attitude/Demeanor/Rapport: Engaged Affect (typically observed): Appropriate Orientation: : Oriented to Place,Oriented to  Time,Oriented to Situation,Oriented to Self Alcohol / Substance Use: Not Applicable Psych Involvement: No (comment)  Admission diagnosis:  ESRD (end stage renal disease) ([place]) [N18.6] Patient Active Problem List   Diagnosis Date Noted  . Encounter for assessment of decision-making capacity 11/12/2020  . ESRD (end stage renal disease) ([place]) 11/08/2020  . Pulmonary edema 01/29/2019  . Hyperkalemia 01/29/2019  . Acute on chronic diastolic CHF (congestive heart failure) ([place]) 01/29/2019  . Acute respiratory failure with hypoxia ([place]) 01/29/2019  . Hematochezia   . Benign neoplasm of cecum   .  Benign neoplasm of ascending colon   . Benign neoplasm of sigmoid colon   . Gastritis and gastroduodenitis   . Rectal bleeding 09/01/2018  . CVA (cerebral vascular accident) ([place]) 11/09/2017  . Acute CVA  (cerebrovascular accident) ([place]) 11/08/2017  . DM (diabetes mellitus), type 2 with renal complications ([place]) 44/08/270  . Altered mental status 07/14/2017  . Knee pain   . Acute lower UTI   . Peripheral arterial disease ([place]) 02/23/2016  . Insulin dependent diabetes mellitus 12/18/2015  . Anemia in chronic kidney disease 12/18/2015  . Dyspnea   . Goals of care, counseling/discussion   . SOB (shortness of breath)   . Encounter for palliative care   . Pulmonary vascular congestion   . Pleural effusion, left   . HLD (hyperlipidemia)   . Noncompliance with medication regimen   . Disorientation   . Somnolence   . Acute respiratory failure with hypercapnia ([place])   . Respiratory acidosis   . Hypernatremia   . Systolic and diastolic CHF, acute ([place])   . CHF, acute on chronic ([place]) 08/05/2015  . Elevated troponin 08/05/2015  . CHF (congestive heart failure) ([place]) 08/05/2015  . Acute on chronic heart failure ([place]) 08/05/2015  . Acute on chronic combined systolic and diastolic congestive heart failure ([place])   . Left carotid bruit 11/20/2014  . Syncope and collapse 06/10/2014  . Syncope 06/10/2014  . Essential hypertension   . Unspecified vitamin D deficiency   . Cellulitis and abscess of right lower extremitiy 02/28/2014  . Chronic combined systolic and diastolic heart failure ([place]) 02/12/2014  . CAD (coronary artery disease)   . Obesity   . Meningioma ([place])   . Anemia   . ESRD on dialysis ([place]) 01/24/2014  . Hyperlipidemia 01/22/2014  . BPPV (benign paroxysmal positional vertigo) 01/22/2014  . Hypertensive urgency 01/21/2014  . Tobacco abuse 01/21/2014  . Diabetes mellitus due to abnormal insulin ([place]) 01/21/2014   PCP:  Benito Mccreedy, MD Pharmacy:   [facility] [place], [place] - [address] [place] [address] Phone: [phone] Fax: [phone]     Social Determinants of  Health (SDOH) Interventions    Readmission Risk Interventions No flowsheet data found.

## 2020-11-12 NOTE — Progress Notes (Signed)
Asked patient to use bipap numerous times overnight. Pt continued to refuse. States it "gets on my nerves" and "I can't stand it".

## 2020-11-12 NOTE — Progress Notes (Signed)
Pt offered some thing to eat and drink, pt declined to be repositioning in bed and snack.

## 2020-11-12 NOTE — Hospital Course (Addendum)
Kathleen [name] is a 75 year old female presenting with weakness and hyperglycemia after having missed 1 week of dialysis.  PMH significant for ESRD on HD T/TH/SA, CHF, acute CVA, T2DM, CAD, BPPV, HLD, pulmonary vascular congestion, tobacco use.  Acute Hypoxic Respiratory Failure with Hypercapnia  On the evening of 4/9, patient became more somnolent and developed emesis. Prior to that she was found to have elevated pulmonary artery pressures and pulmonary care was consulted. With her changes in mental status, a blood gas was obtained and showed worsening acidosis and CO2 retention. Patient was subsequently intubated and transferred to the ICU on 4/10. *** Extubated morning of 4/13, re-intubated that afternoon due to respiratory failure. ***  Acute agitation  capacity evaluation During episode of acute agitation 4/7, patient relayed that she wanted to discharge home.  Unsure etiology of agitation, likely multifactorial.  Differential includes delirium versus effects of dementia versus personality versus OSA.  Delirium likely a part of this, as acute agitation was sudden in onset. No evidence for infectious etiology. After extensive discussion with patient, both primary team and psychiatry team believes she has capacity to make her own decisions.    Respiratory acidosis secondary to hypercapnic respiratory failure Likely OSA +/-obesity hypoventilation syndrome.  Patient declines CPAP, reports she cannot sleep well with it on. Refused attempts at daytime BiPAP. Believe BiPAP nightly would be in her best interest. Consider ambulatory referral to sleep medicine for formal diagnosis and BiPAP mask fitting.    ESRD on HD T/Th/Sa Received one session of HD on day of admission, Wednesday 4/6, then resumed normal T/Th/Sa HD schedule. Nephro discontinued coreg and hydralazine due to soft BP while in HD. After discussion between nephro and primary team, she was started on norvasc daily at bedtime. Patient ***tolerated  this well and was discharged home on same dose.    T2DM  Home insulin glargine 30 units qHS held at time of admission due to patient being hypoglycemia. Glucose measurements within normal range throughout admission. Patient placed on very sensitive sliding scale insulin (given ESRD on HD) status, but did not require any insulin while hospitalized. Recommend no insulin at discharge, due to age and lack of need at this time.      Hypertension  HFpEF Patient hypertensive during admission while on floor, but experienced soft BP during HD. Nephro stopped her coreg and hydralazine as discussed above in ESRD section. Patient discharged on ***norvasc daily at bedtime.     Hypoxemia Patient currently on 3 L O2 via [place] with SPO2 86-100%.  Wean oxygen as tolerated. Discharged on ***L O2 via [place].    Other chronic conditions stable this admission: CAD Hyperlipidemia Insomnia GERD Lower extremity wounds  Issues f/u  Discontiuned insulin due to hypoglycemia, may not need additional glucose control with a1c of 8.0 in 75yo on HD. No need for at home glucose checks. Follow up BP. Coreg and hydralazine stopped by nephro for soft BP during HD. Patient discharged on norvasc qHS. Follow up for tolerance and response.  Oxygen requirement. Patient not on oxygen at home, but required supplemental oxygen while admitted. Discharged to SNF on ***L O2 via [place]. Wean as tolerated with close measurement.  Pulm f/u. ECHO during admission showed evidence of pulmonary hypertension with calcifications on mitral and tricuspid valves. Likely [place]. OSA +/- OHS. Believe her respiratory acidosis due to hypercapnic respiratory failure. Patient uncomfortable with BiPAP. Recommend ambulatory referral to sleep medicine for formal diagnosis and BiPAP mask fitting.

## 2020-11-12 NOTE — Progress Notes (Signed)
PT Cancellation Note  Patient Details Name: Kathleen [name] MRN: [id number] DOB: 1945/08/14   Cancelled Treatment:       Pt at HD , will f/u as able. Abran Richard, PT Acute Rehab Services Pager [phone] [facility] [place] 11/12/2020, 10:18 AM

## 2020-11-12 NOTE — Progress Notes (Signed)
Pt still upset however placed oxygen on at 4L. States she is not a [employer] but in a garage. Will continue to monitor pt and reorient pt.

## 2020-11-12 NOTE — Procedures (Signed)
   I was present at this dialysis session, have reviewed the session itself and made  appropriate changes [name] MD [place] pager [phone]   11/12/2020, 2:00 PM

## 2020-11-12 NOTE — NC FL2 (Signed)
[place] LEVEL OF CARE SCREENING TOOL     IDENTIFICATION  Patient Name: Kathleen [name] Birthdate: Nov 25, 1945 Sex: female Admission Date (Current Location): 12/01/2020  [facility] and [place] Number:  Herbalist and Address:  [facility], [place] [name] [place], [place] [address]      Provider Number: [id number]  Attending Physician Name and Address:  McDiarmid, Blane Ohara, MD  Relative Name and Phone Number:       Current Level of Care: Hospital Recommended Level of Care: [place] Prior Approval Number:    Date Approved/Denied:   PASRR Number: [id number]  Discharge Plan: SNF    Current Diagnoses: Patient Active Problem List   Diagnosis Date Noted  . ESRD (end stage renal disease) ([employer]) 11/27/2020  . Pulmonary edema 01/29/2019  . Hyperkalemia 01/29/2019  . Acute on chronic diastolic CHF (congestive heart failure) ([place]) 01/29/2019  . Acute respiratory failure with hypoxia ([place]) 01/29/2019  . Hematochezia   . Benign neoplasm of cecum   . Benign neoplasm of ascending colon   . Benign neoplasm of sigmoid colon   . Gastritis and gastroduodenitis   . Rectal bleeding 09/01/2018  . CVA (cerebral vascular accident) ([place]) 11/09/2017  . Acute CVA (cerebrovascular accident) ([place]) 11/08/2017  . DM (diabetes mellitus), type 2 with renal complications ([place]) 08/65/7846  . Altered mental status 07/14/2017  . Knee pain   . Acute lower UTI   . Peripheral arterial disease ([place]) 02/23/2016  . Insulin dependent diabetes mellitus 12/18/2015  . Anemia in chronic kidney disease 12/18/2015  . Dyspnea   . Goals of care, counseling/discussion   . SOB (shortness of breath)   . Encounter for palliative care   . Pulmonary vascular congestion   . Pleural effusion, left   . HLD (hyperlipidemia)   . Noncompliance with medication regimen   . Disorientation   . Somnolence   . Acute respiratory failure with hypercapnia ([place])   .  Respiratory acidosis   . Hypernatremia   . Systolic and diastolic CHF, acute ([place])   . CHF, acute on chronic ([place]) 08/05/2015  . Elevated troponin 08/05/2015  . CHF (congestive heart failure) ([place]) 08/05/2015  . Acute on chronic heart failure ([place]) 08/05/2015  . Acute on chronic combined systolic and diastolic congestive heart failure ([place])   . Left carotid bruit 11/20/2014  . Syncope and collapse 06/10/2014  . Syncope 06/10/2014  . Essential hypertension   . Unspecified vitamin D deficiency   . Cellulitis and abscess of right lower extremitiy 02/28/2014  . Chronic combined systolic and diastolic heart failure ([place]) 02/12/2014  . CAD (coronary artery disease)   . Obesity   . Meningioma ([place])   . Anemia   . ESRD on dialysis ([place]) 01/24/2014  . Hyperlipidemia 01/22/2014  . BPPV (benign paroxysmal positional vertigo) 01/22/2014  . Hypertensive urgency 01/21/2014  . Tobacco abuse 01/21/2014  . Diabetes mellitus due to abnormal insulin ([place]) 01/21/2014    Orientation RESPIRATION BLADDER Height & Weight     Self,Time,Situation,Place  O2 (3L) Continent Weight: 192 lb 0.3 oz (87.1 kg) Height:  5\' 3"  (160 cm)  BEHAVIORAL SYMPTOMS/MOOD NEUROLOGICAL BOWEL NUTRITION STATUS        Diet (See discharge summary)  AMBULATORY STATUS COMMUNICATION OF NEEDS Skin   Extensive Assist Verbally Normal,Skin abrasions (Blisters on leg)                       Personal Care  Assistance Level of Assistance  Feeding,Dressing,Bathing Bathing Assistance: Maximum assistance Feeding assistance: Independent Dressing Assistance: Maximum assistance     Functional Limitations Info  Sight,Hearing,Speech Sight Info: Adequate Hearing Info: Adequate Speech Info: Adequate    SPECIAL CARE FACTORS FREQUENCY  PT (By licensed PT),OT (By licensed OT)     PT Frequency: 5x a week OT Frequency: 5x a week            Contractures Contractures Info: Not present    Additional Factors Info  Code  Status,Allergies,Insulin Sliding Scale Code Status Info: Full Allergies Info: Baclofen   Chlorhexidine   Insulin Sliding Scale Info: Novolog 0-6 units 3x a day       Current Medications (11/12/2020):  This is the current hospital active medication list Current Facility-Administered Medications  Medication Dose Route Frequency Provider Last Rate Last Admin  . (feeding supplement) PROSource Plus liquid 30 mL  30 mL Oral BID BM McDiarmid, Blane Ohara, MD      . acetaminophen (TYLENOL) tablet 650 mg  650 mg Oral Q6H PRN Zola Button, MD   650 mg at 11/11/20 0231   Or  . acetaminophen (TYLENOL) suppository 650 mg  650 mg Rectal Q6H PRN Zola Button, MD      . aspirin EC tablet 81 mg  81 mg Oral Daily Zola Button, MD   81 mg at 11/11/20 0920  . atorvastatin (LIPITOR) tablet 40 mg  40 mg Oral q1800 Zola Button, MD   40 mg at 11/11/20 1724  . calcium acetate (PHOSLO) capsule 2,001 mg  2,001 mg Oral TID with meals Zola Button, MD   2,001 mg at 11/11/20 1724  . heparin injection 5,000 Units  5,000 Units Subcutaneous Q8H Zola Button, MD   5,000 Units at 11/12/20 [phone]  . insulin aspart (novoLOG) injection 0-6 Units  0-6 Units Subcutaneous TID WC Zola Button, MD      . mirtazapine (REMERON) tablet 7.5 mg  7.5 mg Oral QHS Zola Button, MD   7.5 mg at 11/11/20 2118  . multivitamin (RENA-VIT) tablet 1 tablet  1 tablet Oral QHS Hammons, Theone Murdoch, RPH      . pantoprazole (PROTONIX) EC tablet 40 mg  40 mg Oral Daily Zola Button, MD   40 mg at 11/11/20 0920  . prochlorperazine (COMPAZINE) tablet 5 mg  5 mg Oral Q8H PRN Simmons-Robinson, Makiera, MD         Discharge Medications: Please see discharge summary for a list of discharge medications.  Relevant Imaging Results:  Relevant Lab Results:   Additional Information SS#: [id number]. Gets HD TTS at [facility], [place]

## 2020-11-12 NOTE — Progress Notes (Signed)
Pt returned from HD. More confused than yesterday. Pt refusing to wear oxygen and BiPAP . Attempted to orient pt very upset wanting to call her daughter and son.

## 2020-11-12 NOTE — Progress Notes (Signed)
[facility] Progress Note   Subjective:  Seen on HD, feeling better, SOB better.   Objective Vitals:   11/12/20 0818 11/12/20 0848 11/12/20 0900 11/12/20 0930  BP: (!) 84/41 (!) 104/43 130/68 (!) 123/55  Pulse:  63 68 70  Resp:      Temp:      TempSrc:      SpO2:      Weight:      Height:       Physical Exam General: Elderly woman, NAD Heart: RRR; no murmur Lungs: CTA in upper lobes, dull bases Abdomen: soft, non-tender Extremities: 1+ pitting BLE; RLE now bandaged as well (wound care has seen her) Dialysis Access: LUE AVF + bruit  Additional Objective Labs: Basic Metabolic Panel: Recent Labs  Lab 11/11/20 0017 11/11/20 1037 11/12/20 0431  NA 139 136 133*  K 6.2* 4.3 4.7  CL 100 99 96*  CO2 27 32 31  GLUCOSE 93 82 160*  BUN 57* 17 27*  CREATININE 9.05* 4.58* 5.85*  CALCIUM 8.7* 7.6* 8.2*  PHOS 8.3*  --   --    Liver Function Tests: Recent Labs  Lab 12/02/2020 1110 11/11/20 0017  AST 23  --   ALT 19  --   ALKPHOS 66  --   BILITOT 0.4  --   PROT 6.8  --   ALBUMIN 3.5 3.0*   Recent Labs  Lab 12/05/2020 1110  LIPASE 33   CBC: Recent Labs  Lab 11/12/2020 1110 11/22/2020 1210 11/11/20 0018 11/12/20 0431  WBC 5.1  --  4.8 5.1  NEUTROABS 3.3  --   --   --   HGB 12.6 14.6 11.5* 10.9*  HCT 43.3 43.0 39.9 37.7  MCV 106.4*  --  107.5* 108.6*  PLT 193  --  154 152   CBG: Recent Labs  Lab 11/11/20 0858 11/11/20 1131 11/11/20 1632 11/11/20 2059 11/12/20 0556  GLUCAP 88 70 124* 178* 131*   Studies/Results: DG Chest Port 1 View  Result Date: 11/27/2020 CLINICAL DATA:  Weakness, nausea EXAM: PORTABLE CHEST 1 VIEW COMPARISON:  04/11/2020 FINDINGS: No frank interstitial edema. Mild right basilar opacity, likely scarring/atelectasis. No focal consolidation. No pleural effusion or pneumothorax. Cardiomegaly. IMPRESSION: Cardiomegaly.  No frank interstitial edema. Electronically Signed   By: Julian Hy M.D.   On: 11/14/2020 12:34    Medications: . sodium chloride    . sodium chloride     . (feeding supplement) PROSource Plus  30 mL Oral BID BM  . aspirin EC  81 mg Oral Daily  . atorvastatin  40 mg Oral q1800  . calcium acetate  2,001 mg Oral TID with meals  . carvedilol  25 mg Oral BID  . heparin  5,000 Units Subcutaneous Q8H  . hydrALAZINE  50 mg Oral Daily  . insulin aspart  0-6 Units Subcutaneous TID WC  . mirtazapine  7.5 mg Oral QHS  . multivitamin  1 tablet Oral QHS  . pantoprazole  40 mg Oral Daily    Dialysis Orders: TTS at [facility] -- last HD 4/2, prior to that last was 3/24 4hr, 350/500, EDW 82kg, 2K/2Ca, UFP #4, AVF, no heparin - Hectoral 24mcg IV q HD  Assessment/Plan: 1.  Weakness: Likely multifactorial in setting of mild overload/hyperkalemia, ?uremia, and ongoing leg wounds. Denies new meds - she is unclear what all is taking @ home. Per primary.  2.  Hyperkalemia: K 6.2 from early this AM - was dialyzed afterwards overnight. K+ 4.7 today, resolved 3.  ESRD: Usual TTS schedule, has missed a lot of HD recently. HD today, max UF.  4.  Hypertension/volume overload: BP's soft on HD, will hold coreg/ hydralazine for now to get more fluid off.  5.  Anemia: Hgb 11.5 - no ESA need at this time. 6.  Metabolic bone disease: Ca ok, Phos high - Phoslo resumed, follow labs. 7.  T2DM: Hypoglycemia initially, now eating better. A1c 8%. Per primary. 8.  PAD/leg wounds: ischemic/stasis dermatitis per WOC notes. 9.  CAD/HFrEF   [name], MD 11/12/2020, 9:42 AM

## 2020-11-12 NOTE — Progress Notes (Addendum)
Family Medicine Teaching Service Daily Progress Note Intern Pager: [phone]  Patient name: Kathleen [name] Medical record number: [id number] Date of birth: [birth year] Age: 75 y.o. Gender: female  Primary Care Provider: Benito Mccreedy, MD Consultants: Nephro Code Status: Full  Pt Overview and Major Events to Date:  4/5 admitted  Assessment and Plan: [name] is a 75 year old female presenting with weakness and hyperglycemia after having missed 1 week of dialysis.  PMH significant for ESRD on HD T/TH/SA, CHF, acute CVA, T2DM, CAD, BPPV, HLD, pulmonary vascular congestion, tobacco use.   Generalized weakness 2/2 respiratory acidosis after missed HD    ESRD on HD T/Th/Sa Patient uncomfortable from being cooped up in bed. Repeat VBG last night equivocal.  Patient unable to tolerate BiPAP for long stretches overnight.  Nursing placed BiPAP as they could.  Suspect improvement after HD today. -Nephro consulted, appreciate ongoing care -BiPAP during the day after HD -Continue home calcium acetate, reflux multivitamins -Vitals per unit routine -PT OT eval and treat -Tylenol as needed -Progressive mobility -Ambulates with walker at baseline -OOB and walk in hall with nurse -compazine 5 mg prn for nausea -ECHO   T2DM  Glucose last 24 hours 131-178.  No corrective insulin administered per protocol. -Increase p.o. intake -Very sensitive sliding scale insulin -CBG 4 times daily -Hypoglycemia protocol -will recommend no insulin at discharge    Lower extremity wounds  Currently bandaged, did not remove bandages to inspect.  Patient reports them feeling okay, no pain at this moment. -Wound care consulted, appreciate recommendations   Hypertension  HFpEF Overnight patient largely normotensive, however measurements fluctuated quite a bit.  Systolics 97-673, diastolics 41-937.  We will recheck cuff size with nursing.   -Continue home carvedilol -Vitals per unit routine    Hypoxemia Patient currently on 3 L O2 via [place] with SPO2 98-99%.  Wean oxygen as tolerated. -Continuous pulse ox -Submental oxygen, wean as tolerated -SPO2 goal > 94%    CAD Continue home aspirin.   Hyperlipidemia Continue home atorvastatin 40 mg daily.   Insomnia Continue home mirtazapine 7.5 mg nightly.   GERD Continue home pantoprazole 40 mg daily.   FEN/GI: Renal core modified diet PPx: Heparin subcu   Status is: Inpatient  Remains inpatient appropriate because:Altered mental status, IV treatments appropriate due to intensity of illness or inability to take PO and Inpatient level of care appropriate due to severity of illness   Dispo: The patient is from: Home              Anticipated d/c is to: Home              Patient currently is not medically stable to d/c.   Difficult to place patient No   Subjective:  Patient found in HD this morning.  She reports has been generally uncomfortable and being nauseous with eating.  She would like to get up and move around more.  Objective: Temp:  [96.5 F (35.8 C)-98.1 F (36.7 C)] 97.9 F (36.6 C) (04/07 0703) Pulse Rate:  [59-72] 63 (04/07 0357) Resp:  [16-24] 18 (04/07 0357) BP: (93-151)/(48-111) 151/53 (04/07 0703) SpO2:  [97 %-100 %] 100 % (04/07 0357) Weight:  [87.1 kg-88.2 kg] 87.1 kg (04/07 0703) Physical Exam: General: Awake, alert, no acute distress Cardiovascular: Regular rate and rhythm, murmur Respiratory: CTA B Abdomen: Soft, nondistended  Laboratory: Recent Labs  Lab 11/16/2020 1110 11/30/2020 1210 11/11/20 0018 11/12/20 0431  WBC 5.1  --  4.8 5.1  HGB 12.6 14.6 11.5*  10.9*  HCT 43.3 43.0 39.9 37.7  PLT 193  --  154 152   Recent Labs  Lab 11/24/2020 1110 12/05/2020 1210 11/11/20 0017 11/11/20 1037 11/12/20 0431  NA 139   < > 139 136 133*  K 5.8*   < > 6.2* 4.3 4.7  CL 98  --  100 99 96*  CO2 31  --  27 32 31  BUN 55*  --  57* 17 27*  CREATININE 8.83*  --  9.05* 4.58* 5.85*  CALCIUM 9.3  --   8.7* 7.6* 8.2*  PROT 6.8  --   --   --   --   BILITOT 0.4  --   --   --   --   ALKPHOS 66  --   --   --   --   ALT 19  --   --   --   --   AST 23  --   --   --   --   GLUCOSE 61*  --  93 82 160*   < > = values in this interval not displayed.    Imaging/Diagnostic Tests: None last 24 hours.   Ezequiel Essex, MD 11/12/2020, 7:22 AM PGY-1, [place] Intern pager: [phone], text pages welcome

## 2020-11-12 NOTE — Plan of Care (Signed)
  Problem: Education: Goal: Knowledge of General Education information will improve Description: Including pain rating scale, medication(s)/side effects and non-pharmacologic comfort measures Outcome: Progressing   Problem: Clinical Measurements: Goal: Ability to maintain clinical measurements within normal limits will improve Outcome: Progressing   

## 2020-11-13 ENCOUNTER — Inpatient Hospital Stay (HOSPITAL_COMMUNITY): Payer: Medicare HMO

## 2020-11-13 ENCOUNTER — Encounter (HOSPITAL_COMMUNITY): Payer: Self-pay | Admitting: Family Medicine

## 2020-11-13 ENCOUNTER — Encounter (HOSPITAL_BASED_OUTPATIENT_CLINIC_OR_DEPARTMENT_OTHER): Payer: Medicare HMO | Admitting: Internal Medicine

## 2020-11-13 DIAGNOSIS — I5042 Chronic combined systolic (congestive) and diastolic (congestive) heart failure: Secondary | ICD-10-CM

## 2020-11-13 DIAGNOSIS — R2689 Other abnormalities of gait and mobility: Secondary | ICD-10-CM

## 2020-11-13 DIAGNOSIS — R400 Somnolence: Secondary | ICD-10-CM

## 2020-11-13 DIAGNOSIS — R531 Weakness: Secondary | ICD-10-CM | POA: Diagnosis not present

## 2020-11-13 DIAGNOSIS — Z8673 Personal history of transient ischemic attack (TIA), and cerebral infarction without residual deficits: Secondary | ICD-10-CM

## 2020-11-13 DIAGNOSIS — E1121 Type 2 diabetes mellitus with diabetic nephropathy: Secondary | ICD-10-CM | POA: Diagnosis not present

## 2020-11-13 DIAGNOSIS — I152 Hypertension secondary to endocrine disorders: Secondary | ICD-10-CM

## 2020-11-13 DIAGNOSIS — E1159 Type 2 diabetes mellitus with other circulatory complications: Secondary | ICD-10-CM

## 2020-11-13 DIAGNOSIS — M6281 Muscle weakness (generalized): Secondary | ICD-10-CM | POA: Diagnosis present

## 2020-11-13 DIAGNOSIS — J9612 Chronic respiratory failure with hypercapnia: Secondary | ICD-10-CM | POA: Diagnosis not present

## 2020-11-13 LAB — URINALYSIS, ROUTINE W REFLEX MICROSCOPIC
Bilirubin Urine: NEGATIVE
Glucose, UA: NEGATIVE mg/dL
Ketones, ur: NEGATIVE mg/dL
Nitrite: NEGATIVE
Protein, ur: 100 mg/dL — AB
RBC / HPF: >50 RBC/hpf — ABNORMAL HIGH (ref 0–5)
Specific Gravity, Urine: 1.013 (ref 1.005–1.030)
pH: 5 (ref 5.0–8.0)

## 2020-11-13 LAB — BASIC METABOLIC PANEL
Anion gap: 13 (ref 5–15)
BUN: 11 mg/dL (ref 8–23)
CO2: 26 mmol/L (ref 22–32)
Calcium: 8.2 mg/dL — ABNORMAL LOW (ref 8.9–10.3)
Chloride: 98 mmol/L (ref 98–111)
Creatinine, Ser: 3.69 mg/dL — ABNORMAL HIGH (ref 0.44–1.00)
GFR, Estimated: 12 mL/min — ABNORMAL LOW (ref >60)
Glucose, Bld: 94 mg/dL (ref 70–99)
Potassium: 4.2 mmol/L (ref 3.5–5.1)
Sodium: 137 mmol/L (ref 135–145)

## 2020-11-13 LAB — ECHOCARDIOGRAM COMPLETE
Area-P 1/2: 2.26 cm2
Height: 63 in
MV VTI: 1.03 cm2
S' Lateral: 2.6 cm
Weight: 2980.62 oz

## 2020-11-13 LAB — GLUCOSE, CAPILLARY
Glucose-Capillary: 90 mg/dL (ref 70–99)
Glucose-Capillary: 97 mg/dL (ref 70–99)
Glucose-Capillary: 122 mg/dL — ABNORMAL HIGH (ref 70–99)
Glucose-Capillary: 121 mg/dL — ABNORMAL HIGH (ref 70–99)

## 2020-11-13 LAB — HEPATITIS B SURFACE ANTIBODY, QUANTITATIVE: Hep B S AB Quant (Post): 9.9 m[IU]/mL — ABNORMAL LOW (ref >9.9)

## 2020-11-13 MED ORDER — COVID-19 MRNA VACC (MODERNA) 100 MCG/0.5ML IM SUSP
0.5000 mL | Freq: Once | INTRAMUSCULAR | Status: AC
  Administered 2020-11-13: 0.5 mL via INTRAMUSCULAR
  Filled 2020-11-13: qty 0.5

## 2020-11-13 MED ORDER — AMLODIPINE BESYLATE 5 MG PO TABS
5.0000 mg | ORAL_TABLET | Freq: Every day | ORAL | Status: DC
  Administered 2020-11-13: 5 mg via ORAL
  Filled 2020-11-13: qty 1

## 2020-11-13 NOTE — Progress Notes (Signed)
Spoke with Dr. Jonnie Finner, nephro. They held coreg and hydralazine because of soft Bps during dialysis. Asked if there was any antihypertensive he'd be comfortable with dosed maybe after HD.   Dr. Jonnie Finner relayed he'd be fine with something like norvasc dosed qHS. Wanted to avoid BID.   Will place order.   Ezequiel Essex, MD

## 2020-11-13 NOTE — Progress Notes (Signed)
Pt is currently in chair with eyes close. Chair alarm on. Call bell within reach.

## 2020-11-13 NOTE — Progress Notes (Signed)
Physical Therapy Treatment Patient Details Name: Kathleen [name] MRN: [id number] DOB: 1946-06-21 Today's Date: 11/13/2020    History of Present Illness Ms. Kathleen [name] is a 75 year old female presenting on 11/19/2020 with weakness and hyperglycemia after having missed 1 week of dialysis.  PMH significant for ESRD on HD T/TH/SA, CHF, acute CVA, T2DM, CAD, BPPV, HLD, pulmonary vascular congestion, tobacco use.    PT Comments    Patient progressing slowly towards PT goals. Lethargic initially but able to wake up more once sitting EOB. Requires Max A of 2 for bed mobility and Mod A of 2 to stand from EOB with difficulty. Able to take a few steps to get to chair with Min A of 2 for safety and use of RW. Noted to have BLE weakness and 2/4 DOE. Sp02 dropped to 84% on 2L/min 02 Kathleen [name] at rest in bed however maintained >94% on 2L/min 02 Kathleen [name] with activity during session. Recommend use of stedy for nursing transfer back to bed, RN made aware. Continue to recommend SNF at this time. Will follow.    Follow Up Recommendations  SNF     Equipment Recommendations  Rolling walker with 5" wheels;3in1 (PT);Wheelchair (measurements PT);Wheelchair cushion (measurements PT)    Recommendations for Other Services       Precautions / Restrictions Precautions Precautions: Fall Restrictions Weight Bearing Restrictions: No    Mobility  Bed Mobility Overal bed mobility: Needs Assistance Bed Mobility: Rolling;Sidelying to Sit Rolling: Mod assist Sidelying to sit: Max assist;HOB elevated       General bed mobility comments: Step by step cues for sequencing, assist with rolling towards left, reaching for rail, trunk, LEs and scooting bottom to EOB. Increased time.    Transfers Overall transfer level: Needs assistance Equipment used: Rolling walker (2 wheeled) Transfers: Sit to/from Stand Sit to Stand: Mod assist;+2 physical assistance         General transfer comment: Assist of 2 to stand from EOB with cues for  hand placement and use of momentum.  Ambulation/Gait Ambulation/Gait assistance: Min assist;+2 safety/equipment Gait Distance (Feet): 4 Feet Assistive device: Rolling walker (2 wheeled) Gait Pattern/deviations: Wide base of support;Trunk flexed;Shuffle Gait velocity: decreased   General Gait Details: Able to take a few steps to get to chair with wide BoS; BLE weakness noted. 2/4 DOE. Sp02 95% on 2L/min 02 [name].   Stairs             Wheelchair Mobility    Modified Rankin (Stroke Patients Only)       Balance Overall balance assessment: Needs assistance Sitting-balance support: Feet supported;No upper extremity supported Sitting balance-Kathleen [name] Scale: Fair Sitting balance - Comments: Min guard for safety. Able to take pills sitting EOB with posterior lean.   Standing balance support: During functional activity Standing balance-Kathleen [name] Scale: Poor Standing balance comment: Requires UE support in standing.                            Cognition Arousal/Alertness: Lethargic Behavior During Therapy: WFL for tasks assessed/performed Overall Cognitive Status: No family/caregiver present to determine baseline cognitive functioning                                 General Comments: States no to being at the hospital. Initially sleepy needing cues to keep eyes opened. Follows simple commands. Wakes up more once sitting EOB.      Exercises  General Comments General comments (skin integrity, edema, etc.): SP02 dropped to 84% on 2L.min 02 [name] at rest in bed. For rest of session, Sp02 maintained in mid 90s on 2L/min 02 [place].      Pertinent Vitals/Pain Pain Assessment: No/denies pain    Home Living                      Prior Function            PT Goals (current goals can now be found in the care plan section) Progress towards PT goals: Progressing toward goals    Frequency    Min 2X/week      PT Plan Current plan remains appropriate     Co-evaluation              AM-PAC PT "6 Clicks" Mobility   Outcome Measure  Help needed turning from your back to your side while in a flat bed without using bedrails?: A Lot Help needed moving from lying on your back to sitting on the side of a flat bed without using bedrails?: Total Help needed moving to and from a bed to a chair (including a wheelchair)?: A Lot Help needed standing up from a chair using your arms (e.g., wheelchair or bedside chair)?: A Lot Help needed to walk in hospital room?: A Little Help needed climbing 3-5 steps with a railing? : Total 6 Click Score: 11    End of Session Equipment Utilized During Treatment: Gait belt;Oxygen Activity Tolerance: Patient tolerated treatment well Patient left: in chair;with call bell/phone within reach;with chair alarm set Nurse Communication: Mobility status;Other (comment) (stedy back to bed) PT Visit Diagnosis: Other abnormalities of gait and mobility (R26.89);Muscle weakness (generalized) (M62.81)     Time: 0981-1914 PT Time Calculation (min) (ACUTE ONLY): 24 min  Charges:  $Therapeutic Activity: 23-37 mins                     Marisa Severin, PT, DPT Acute Rehabilitation Services Pager [phone] Office [phone]       Marguarite Arbour A Sabra Heck 11/13/2020, 11:46 AM

## 2020-11-13 NOTE — Progress Notes (Signed)
Occupational Therapy Treatment Patient Details Name: Kathleen [name] MRN: [id number] DOB: 01-02-1946 Today's Date: 11/13/2020    History of present illness Kathleen [name] is a 75 year old female presenting on 11/30/2020 with weakness and hyperglycemia after having missed 1 week of dialysis.  PMH significant for ESRD on HD T/TH/SA, CHF, acute CVA, T2DM, CAD, BPPV, HLD, pulmonary vascular congestion, tobacco use.   OT comments  Patient making incremental gains to patient focused goals.  Patient remained lethargic and closing eyes throughout the session.  Patient able to take a few bites of lunch with increased spillage noted.  OT provided wash cloth and soapy water to wash hands and face.  Patient able to lean forward times three for improved trunk strength, but declined anymore due to fatigue.  OT offered to assist patient back to bed, but patient stated recliner is more comfortable than bed.  NT in room and aware of patient wishes to stay up in recliner.  OT will continue to follow in the acute setting, but she will need SNF post acute due to Max level of assist for ADL completion, and lack of assist at home to provide heavy 24 hour care.  OT recommends STEADY lift for back to bed.  Patient unable to stand without +2 for transfers.    Follow Up Recommendations  SNF    Equipment Recommendations    Hospital bed, and hoyer lift.  20x18" Wheelchair.     Recommendations for Other Services      Precautions / Restrictions Precautions Precautions: Fall Restrictions Weight Bearing Restrictions: No       Mobility Bed Mobility Overal bed mobility: Needs Assistance Bed Mobility: Rolling;Sidelying to Sit Rolling: Mod assist Sidelying to sit: Max assist;HOB elevated       General bed mobility comments: Step by step cues for sequencing, assist with rolling towards left, reaching for rail, trunk, LEs and scooting bottom to EOB. Increased time. Patient Response: Restless  Transfers Overall transfer  level: Needs assistance Equipment used: Rolling walker (2 wheeled) Transfers: Sit to/from Stand Sit to Stand: Mod assist;+2 physical assistance         General transfer comment: Assist of 2 to stand from EOB with cues for hand placement and use of momentum.    Balance Overall balance assessment: Needs assistance Sitting-balance support: Feet supported;No upper extremity supported Sitting balance-Leahy Scale: Fair Sitting balance - Comments: Min guard for safety. Able to take pills sitting EOB with posterior lean.   Standing balance support: During functional activity Standing balance-Leahy Scale: Poor Standing balance comment: Requires UE support in standing.                           ADL either performed or assessed with clinical judgement   ADL   Eating/Feeding: Set up;Sitting   Grooming: Wash/dry hands;Wash/dry face;Sitting;Supervision/safety           Upper Body Dressing : Moderate assistance;Sitting   Lower Body Dressing: Total assistance;Sitting/lateral leans                                         Cognition Arousal/Alertness: Lethargic Behavior During Therapy: WFL for tasks assessed/performed Overall Cognitive Status: No family/caregiver present to determine baseline cognitive functioning  General Comments: States no to being at the hospital. Initially sleepy needing cues to keep eyes opened. Follows simple commands. Wakes up more once sitting EOB.        Exercises     Shoulder Instructions       General Comments SP02 dropped to 84% on 2L.min 02 [place] at rest in bed. For rest of session, Sp02 maintained in mid 90s on 2L/min 02 [place].    Pertinent Vitals/ Pain       Pain Assessment: No/denies pain                                                          Frequency  Min 2X/week        Progress Toward Goals  OT Goals(current goals can now be found in the  care plan section)  Progress towards OT goals: Not progressing toward goals - comment (limited by lethargy and closing eyes)  Acute Rehab OT Goals Patient Stated Goal: not stated OT Goal Formulation: With patient Time For Goal Achievement: 12-08-2020 Potential to Achieve Goals: [place] Discharge plan remains appropriate    Co-evaluation                 AM-PAC OT "6 Clicks" Daily Activity     Outcome Measure   Help from another person eating meals?: None Help from another person taking care of personal grooming?: A Little Help from another person toileting, which includes using toliet, bedpan, or urinal?: Total Help from another person bathing (including washing, rinsing, drying)?: A Lot Help from another person to put on and taking off regular upper body clothing?: A Little Help from another person to put on and taking off regular lower body clothing?: Total 6 Click Score: 14    End of Session Equipment Utilized During Treatment: Oxygen  OT Visit Diagnosis: Other abnormalities of gait and mobility (R26.89);Muscle weakness (generalized) (M62.81)   Activity Tolerance Patient limited by fatigue   Patient Left in chair;with call bell/phone within reach   Nurse Communication Need for lift equipment        Time: 1433-1451 OT Time Calculation (min): 18 min  Charges: OT General Charges $OT Visit: 1 Visit OT Treatments $Self Care/Home Management : 8-22 mins  11/13/2020  Rich, OTR/L  Acute Rehabilitation Services  Office:  [phone]    Metta Clines 11/13/2020, 2:58 PM

## 2020-11-13 NOTE — Progress Notes (Signed)
Unable to wean pt to room air at this time.  Pts oxygen saturation dropped to 85% at room air. Pt recovered quickly when placed back to 3L [place] at 99%.   Will continue to monitor.

## 2020-11-13 NOTE — Progress Notes (Signed)
  Echocardiogram 2D Echocardiogram 3D has been performed.  Darlina Sicilian M 11/13/2020, 10:55 AM

## 2020-11-13 NOTE — Progress Notes (Signed)
Nurse offered pt assistance to transfer back to bed. Pt declined at this time.  Pt refused to have her legs wrapped at this time.   will continue to monitor.

## 2020-11-13 NOTE — Progress Notes (Signed)
Pt transferred back to bed.

## 2020-11-13 NOTE — Progress Notes (Signed)
Pt offered  Something to drink and to be repositioned, pt declined. Pt refused to have vitals taken.

## 2020-11-13 NOTE — Progress Notes (Signed)
[facility] Progress Note   Subjective:  Up in chair, grumpy  Objective Vitals:   11/12/20 2346 11/13/20 0000 11/13/20 0411 11/13/20 1100  BP: (!) 178/81  (!) 162/72   Pulse: 83  84 81  Resp: 20  20 19   Temp: 97.9 F (36.6 C)  98.4 F (36.9 C) 98.3 F (36.8 C)  TempSrc: Oral  Oral   SpO2: (!) 86% 95% 98%   Weight:   84.5 kg   Height:       Physical Exam General: Elderly woman, NAD Heart: RRR; no murmur Lungs: CTA in upper lobes, dull bases Abdomen: soft, non-tender Extremities: 1-2+ pitting BLE w/ blistering; RLE now bandaged as well Dialysis Access: LUE AVF + bruit  Additional Objective Labs: Basic Metabolic Panel: Recent Labs  Lab 11/11/20 0017 11/11/20 1037 11/12/20 0431 11/13/20 0103  NA 139 136 133* 137  K 6.2* 4.3 4.7 4.2  CL 100 99 96* 98  CO2 27 32 31 26  GLUCOSE 93 82 160* 94  BUN 57* 17 27* 11  CREATININE 9.05* 4.58* 5.85* 3.69*  CALCIUM 8.7* 7.6* 8.2* 8.2*  PHOS 8.3*  --   --   --    Liver Function Tests: Recent Labs  Lab 12/02/2020 1110 11/11/20 0017  AST 23  --   ALT 19  --   ALKPHOS 66  --   BILITOT 0.4  --   PROT 6.8  --   ALBUMIN 3.5 3.0*   Recent Labs  Lab 12/02/2020 1110  LIPASE 33   CBC: Recent Labs  Lab 11/06/2020 1110 11/22/2020 1210 11/11/20 0018 11/12/20 0431  WBC 5.1  --  4.8 5.1  NEUTROABS 3.3  --   --   --   HGB 12.6 14.6 11.5* 10.9*  HCT 43.3 43.0 39.9 37.7  MCV 106.4*  --  107.5* 108.6*  PLT 193  --  154 152   CBG: Recent Labs  Lab 11/12/20 1124 11/12/20 1557 11/12/20 2146 11/13/20 0559 11/13/20 1246  GLUCAP 84 94 99 90 97   Studies/Results: No results found. Medications:  . (feeding supplement) PROSource Plus  30 mL Oral BID BM  . amLODipine  5 mg Oral QHS  . aspirin EC  81 mg Oral Daily  . atorvastatin  40 mg Oral q1800  . calcium acetate  2,001 mg Oral TID with meals  . COVID-19 mRNA vaccine (Moderna)  0.5 mL Intramuscular Once  . heparin  5,000 Units Subcutaneous Q8H  . insulin  aspart  0-6 Units Subcutaneous TID WC  . mirtazapine  7.5 mg Oral QHS  . multivitamin  1 tablet Oral QHS  . pantoprazole  40 mg Oral Daily    OP HD: TTS [place] (last HD 4/2, prior to that last was 3/24)  4h  350/500  82kg  2/2 bath  P4  AVF  Hep none - Hectoral 41mcg IV q HD  Assessment/Plan: 1.  Weakness: Likely multifactorial in setting of mild overload/hyperkalemia, missed OP HD/ uremia and ongoing leg wounds. Sig deconditioning as well it appears.  2.  Hyperkalemia: resolved 3.  ESRD: w/ missed OP HD. Usual TTS schedule here. HD tomorrow.   4.  HTN - BP's soft on HD, if need BP lowering meds try to give in the evening only.  5.  Volume overload: was 6kg w/ vasc congestion on CXR on admission. SP HD x 2, is up now 2kg up. Lungs sounds better. Continue to lower vol w/ HD tomorrow.  6.  Anemia:  Hgb 11.5 - no ESA need at this time. 7.  Metabolic bone disease: Ca ok, Phos high - Phoslo resumed, follow labs. 8.  T2DM: Hypoglycemia initially, now eating better. A1c 8%. Per primary. 9.  PAD/leg wounds: ischemic/stasis dermatitis per [facility]OC notes. 10.  CAD/HFrEF   [name], MD 11/13/2020, 12:57 PM

## 2020-11-13 NOTE — TOC Progression Note (Addendum)
Transition of Care [facility]) - Progression Note    Patient Details  Name: ENISA RUNYAN MRN: [id number] Date of Birth: 09/17/1945  [facility] CM/SW Contact  Reece Agar, [place] Phone Number: 11/13/2020, 2:01 PM  Clinical Narrative:    10:50am- CSW spoke with Janann Colonel at [facility] and she can no longer take pt due to pt taking legal action on facility in the past. CSW will continue to look for bed placement for pt.  2:00- Pt was accepted on the Hub for [facility] but has dialysis in [place]. CSW will follow up to make sure pt is able to change HD locations and follow up with the family. . 2:26pm CSW contacted pt son [name] and explained that [facility] could not accept pt and the only facility that is willing to take pt at the moment it [facility]. [name] said that he will discuss the situation with his siblings and to call if there are any other changes or facilities available. CSW will follow up.     Expected Discharge Plan: [place] Barriers to Discharge: Continued Medical Work up  Expected Discharge Plan and Services Expected Discharge Plan: [place] Choice: [place] arrangements for the past 2 months: Single Family Home                                       Social Determinants of Health (SDOH) Interventions    Readmission Risk Interventions No flowsheet data found.

## 2020-11-13 NOTE — Progress Notes (Signed)
Family Medicine Teaching Service Daily Progress Note Intern Pager: [phone]  Patient name: Kathleen [name] Medical record number: [id number] Date of birth: 04-30-1946 Age: 75 y.o. Gender: female  Primary Care Provider: Benito Mccreedy, MD Consultants: Nephro Code Status: Full  Pt Overview and Major Events to Date:  4/5 admitted, extra HD 4/6 regular HD, acute agitation after  Assessment and Plan: Kathleen [name] is a 75 year old female presenting with weakness and hyperglycemia after having missed 1 week of dialysis.  PMH significant for ESRD on HD T/TH/SA, CHF, acute CVA, T2DM, CAD, BPPV, HLD, pulmonary vascular congestion, tobacco use.  Acute agitation  capacity evaluation During episode of acute agitation, patient relayed that she wanted to discharge home.  Unsure etiology of agitation, likely multifactorial.  Differential includes delirium versus effects of dementia versus personality versus OSA.  Delirium likely a part of this, as acute agitation was sudden in onset.  Will evaluate infectious, metabolic, environmental causes.  At this time, both primary team and psychiatry team believes she has capacity to make her own decisions.  -Delirium precautions -Psychiatry on board, appreciate recommendations -UA demonstrates large hemoglobin, moderate leukocytes, 100 protein, many bacteria  Respiratory acidosis secondary to hypercapnic respiratory failure Likely OSA +/-obesity hypoventilation syndrome.  Patient declines CPAP, reports she cannot sleep well with it on. -Blood Cx -Urine Cx -If condition progresses or fails to resolve, CXR -Attempt BiPAP during the day -Vitals PT routine -Up out of bed and walk in hall with nurse -PT OT eval and treat, recommend HH PT -Tylenol as needed - Will recommend sending to sleep medicine as outpatient   ESRD on HD T/Th/Sa Now on her normal schedule.  Received additional HD session Wednesday on admission. -Nephrology on board, appreciate ongoing  care - renal navigator on board to facilitate HD coordination for DC to SNF  T2DM  Glucose last 24 hours 90-94.  No insulin administered. -Increase p.o. intake -Very sensitive sliding scale insulin -CBG 4 times daily -Hypoglycemia protocol -will recommend no insulin at discharge    Lower extremity wounds  Currently bandaged, did not remove bandages to inspect.  Patient reports them feeling okay, no pain at this moment. -Wound care consulted, appreciate recommendations   Hypertension  HFpEF Patient hypertensive in latter half of yesterday, systolics [id number]. -Per nephro, will consider hold Coreg/hydralazine for now.  -Vitals per unit routine -Awaiting echo - will touch base with nephro about preferred antihypertensive agent, if any, they would approve for post-HD dosing to avoid hypotension during HD but also control BP afterwards   Hypoxemia Patient currently on 3 L O2 via [place] with SPO2 86-100%.  Wean oxygen as tolerated. -Continuous pulse ox -Submental oxygen, wean as tolerated -SPO2 goal > 94%    CAD Continue home aspirin.   Hyperlipidemia Continue home atorvastatin 40 mg daily.   Insomnia Continue home mirtazapine 7.5 mg nightly.   GERD Continue home pantoprazole 40 mg daily.   FEN/GI: Renal core modified diet PPx: Heparin subcu    Status is: Inpatient  Remains inpatient appropriate because:IV treatments appropriate due to intensity of illness or inability to take PO and Inpatient level of care appropriate due to severity of illness   Dispo: The patient is from: Home              Anticipated d/c is to: SNF              Patient currently is medically stable to d/c.   Difficult to place patient No   Subjective:  Kathleen [name]  was found sitting at edge of bed with PT at bedside. She has no complaints. Very quiet and pleasant this morning. Wanting SNF placement.   Objective: Temp:  [97.9 F (36.6 C)-98.4 F (36.9 C)] 98.4 F (36.9 C) (04/08 0411) Pulse  Rate:  [59-84] 84 (04/08 0411) Resp:  [18-20] 20 (04/08 0411) BP: (84-182)/(31-81) 162/72 (04/08 0411) SpO2:  [86 %-100 %] 98 % (04/08 0411) Weight:  [84.5 kg-87.1 kg] 84.5 kg (04/08 0411) Physical Exam: General: awake, no acute distress Cardiovascular: RRR, no murmur Respiratory: CTAB Abdomen: obese abdomen non-distended Extremities: BLE wrapped for ulcers, moving spontaneously, no noted focal deficits  Laboratory: Recent Labs  Lab 11/18/2020 1110 11/18/2020 1210 11/11/20 0018 11/12/20 0431  WBC 5.1  --  4.8 5.1  HGB 12.6 14.6 11.5* 10.9*  HCT 43.3 43.0 39.9 37.7  PLT 193  --  154 152   Recent Labs  Lab 11/09/2020 1110 11/16/2020 1210 11/11/20 1037 11/12/20 0431 11/13/20 0103  NA 139   < > 136 133* 137  K 5.8*   < > 4.3 4.7 4.2  CL 98   < > 99 96* 98  CO2 31   < > 32 31 26  BUN 55*   < > 17 27* 11  CREATININE 8.83*   < > 4.58* 5.85* 3.69*  CALCIUM 9.3   < > 7.6* 8.2* 8.2*  PROT 6.8  --   --   --   --   BILITOT 0.4  --   --   --   --   ALKPHOS 66  --   --   --   --   ALT 19  --   --   --   --   AST 23  --   --   --   --   GLUCOSE 61*   < > 82 160* 94   < > = values in this interval not displayed.    Imaging/Diagnostic Tests: None last 24 hours.   Ezequiel Essex, MD 11/13/2020, 7:37 AM PGY-1, [place] Intern pager: [phone], text pages welcome

## 2020-11-13 NOTE — Plan of Care (Signed)
  Problem: Education: Goal: Knowledge of General Education information will improve Description: Including pain rating scale, medication(s)/side effects and non-pharmacologic comfort measures Outcome: Progressing   Problem: Clinical Measurements: Goal: Ability to maintain clinical measurements within normal limits will improve Outcome: Progressing   

## 2020-11-14 ENCOUNTER — Encounter (HOSPITAL_COMMUNITY): Payer: Self-pay | Admitting: Family Medicine

## 2020-11-14 DIAGNOSIS — R2689 Other abnormalities of gait and mobility: Secondary | ICD-10-CM | POA: Diagnosis not present

## 2020-11-14 DIAGNOSIS — I050 Rheumatic mitral stenosis: Secondary | ICD-10-CM | POA: Diagnosis present

## 2020-11-14 DIAGNOSIS — I2729 Other secondary pulmonary hypertension: Secondary | ICD-10-CM

## 2020-11-14 DIAGNOSIS — G478 Other sleep disorders: Secondary | ICD-10-CM

## 2020-11-14 DIAGNOSIS — J9612 Chronic respiratory failure with hypercapnia: Secondary | ICD-10-CM | POA: Diagnosis not present

## 2020-11-14 DIAGNOSIS — I272 Pulmonary hypertension, unspecified: Secondary | ICD-10-CM | POA: Insufficient documentation

## 2020-11-14 DIAGNOSIS — I5189 Other ill-defined heart diseases: Secondary | ICD-10-CM

## 2020-11-14 DIAGNOSIS — E1121 Type 2 diabetes mellitus with diabetic nephropathy: Secondary | ICD-10-CM | POA: Diagnosis not present

## 2020-11-14 DIAGNOSIS — N186 End stage renal disease: Secondary | ICD-10-CM | POA: Diagnosis not present

## 2020-11-14 DIAGNOSIS — J9602 Acute respiratory failure with hypercapnia: Secondary | ICD-10-CM | POA: Diagnosis present

## 2020-11-14 HISTORY — DX: Other secondary pulmonary hypertension: I27.29

## 2020-11-14 HISTORY — DX: Other sleep disorders: G47.8

## 2020-11-14 LAB — CBC
HCT: 39 % (ref 36.0–46.0)
Hemoglobin: 11 g/dL — ABNORMAL LOW (ref 12.0–15.0)
MCH: 30.8 pg (ref 26.0–34.0)
MCHC: 28.2 g/dL — ABNORMAL LOW (ref 30.0–36.0)
MCV: 109.2 fL — ABNORMAL HIGH (ref 80.0–100.0)
Platelets: 131 10*3/uL — ABNORMAL LOW (ref 150–400)
RBC: 3.57 MIL/uL — ABNORMAL LOW (ref 3.87–5.11)
RDW: 13.9 % (ref 11.5–15.5)
WBC: 6.6 10*3/uL (ref 4.0–10.5)
nRBC: 0 % (ref 0.0–0.2)

## 2020-11-14 LAB — BASIC METABOLIC PANEL
Anion gap: 11 (ref 5–15)
BUN: 14 mg/dL (ref 8–23)
CO2: 27 mmol/L (ref 22–32)
Calcium: 9.2 mg/dL (ref 8.9–10.3)
Chloride: 97 mmol/L — ABNORMAL LOW (ref 98–111)
Creatinine, Ser: 4.04 mg/dL — ABNORMAL HIGH (ref 0.44–1.00)
GFR, Estimated: 11 mL/min — ABNORMAL LOW (ref >60)
Glucose, Bld: 75 mg/dL (ref 70–99)
Potassium: 3.9 mmol/L (ref 3.5–5.1)
Sodium: 135 mmol/L (ref 135–145)

## 2020-11-14 LAB — GLUCOSE, CAPILLARY
Glucose-Capillary: 95 mg/dL (ref 70–99)
Glucose-Capillary: 80 mg/dL (ref 70–99)
Glucose-Capillary: 107 mg/dL — ABNORMAL HIGH (ref 70–99)
Glucose-Capillary: 74 mg/dL (ref 70–99)

## 2020-11-14 LAB — BLOOD GAS, ARTERIAL
Acid-Base Excess: 0.5 mmol/L (ref 0.0–2.0)
Bicarbonate: 29.8 mmol/L — ABNORMAL HIGH (ref 20.0–28.0)
FIO2: 32
O2 Saturation: 82.7 %
Patient temperature: 37
pCO2 arterial: 104 mmHg (ref 32.0–48.0)
pH, Arterial: 7.084 — CL (ref 7.350–7.450)
pO2, Arterial: 50.6 mmHg — ABNORMAL LOW (ref 83.0–108.0)

## 2020-11-14 LAB — TROPONIN I (HIGH SENSITIVITY): Troponin I (High Sensitivity): 51 ng/L — ABNORMAL HIGH (ref <18)

## 2020-11-14 MED ORDER — HEPARIN SODIUM (PORCINE) 5000 UNIT/ML IJ SOLN: 5000.0000 [IU] | Freq: Three times a day (TID) | INTRAMUSCULAR | Status: DC

## 2020-11-14 MED ORDER — FAMOTIDINE IN NACL 20-0.9 MG/50ML-% IV SOLN: 20.0000 mg | Freq: Every day | INTRAVENOUS | Status: DC

## 2020-11-14 MED ORDER — ALBUMIN HUMAN 25 % IV SOLN
25.0000 g | Freq: Once | INTRAVENOUS | Status: AC
  Administered 2020-11-14: 25 g via INTRAVENOUS

## 2020-11-14 MED ORDER — PROPOFOL 1000 MG/100ML IV EMUL
5.0000 ug/kg/min | INTRAVENOUS | Status: DC
  Administered 2020-11-15: 10 ug/kg/min via INTRAVENOUS
  Filled 2020-11-14: qty 100

## 2020-11-14 MED ORDER — TRAMADOL HCL 50 MG PO TABS
50.0000 mg | ORAL_TABLET | Freq: Two times a day (BID) | ORAL | Status: DC | PRN
  Administered 2020-11-14: 50 mg via ORAL
  Filled 2020-11-14: qty 1

## 2020-11-14 MED ORDER — POLYETHYLENE GLYCOL 3350 17 G PO PACK: 17.0000 g | PACK | Freq: Every day | ORAL | Status: DC | PRN

## 2020-11-14 MED ORDER — FENTANYL CITRATE (PF) 100 MCG/2ML IJ SOLN
INTRAMUSCULAR | Status: AC
  Filled 2020-11-14: qty 2

## 2020-11-14 MED ORDER — ETOMIDATE 2 MG/ML IV SOLN
20.0000 mg | Freq: Once | INTRAVENOUS | Status: AC
  Administered 2020-11-15: 20 mg via INTRAVENOUS
  Filled 2020-11-14: qty 10

## 2020-11-14 MED ORDER — NOREPINEPHRINE 4 MG/250ML-% IV SOLN
INTRAVENOUS | Status: AC
  Filled 2020-11-14: qty 250

## 2020-11-14 MED ORDER — ROCURONIUM BROMIDE 10 MG/ML (PF) SYRINGE
PREFILLED_SYRINGE | INTRAVENOUS | Status: AC
  Administered 2020-11-15: 90 mg
  Filled 2020-11-14: qty 10

## 2020-11-14 MED ORDER — FENTANYL CITRATE (PF) 100 MCG/2ML IJ SOLN: 25.0000 ug | Freq: Once | INTRAMUSCULAR | Status: AC

## 2020-11-14 MED ORDER — POLYETHYLENE GLYCOL 3350 17 G PO PACK: 17.0000 g | PACK | Freq: Every day | ORAL | Status: DC

## 2020-11-14 MED ORDER — ROCURONIUM BROMIDE 50 MG/5ML IV SOLN
90.0000 mg | Freq: Once | INTRAVENOUS | Status: AC
  Filled 2020-11-14: qty 9

## 2020-11-14 MED ORDER — MIDODRINE HCL 5 MG PO TABS
10.0000 mg | ORAL_TABLET | ORAL | Status: DC
  Filled 2020-11-14: qty 2

## 2020-11-14 MED ORDER — FENTANYL CITRATE (PF) 100 MCG/2ML IJ SOLN
25.0000 ug | INTRAMUSCULAR | Status: DC | PRN
  Administered 2020-11-18: 50 ug via INTRAVENOUS
  Filled 2020-11-14: qty 2

## 2020-11-14 MED ORDER — ALBUMIN HUMAN 25 % IV SOLN
INTRAVENOUS | Status: AC
  Filled 2020-11-14: qty 100

## 2020-11-14 MED ORDER — ONDANSETRON HCL 4 MG/2ML IJ SOLN
4.0000 mg | Freq: Once | INTRAMUSCULAR | Status: AC
  Administered 2020-11-14: 4 mg via INTRAVENOUS
  Filled 2020-11-14: qty 2

## 2020-11-14 MED ORDER — FENTANYL 2500MCG IN NS 250ML (10MCG/ML) PREMIX INFUSION
25.0000 ug/h | INTRAVENOUS | Status: DC
  Administered 2020-11-15: 100 ug/h via INTRAVENOUS
  Administered 2020-11-15: 25 ug/h via INTRAVENOUS
  Administered 2020-11-17: 50 ug/h via INTRAVENOUS
  Filled 2020-11-14 (×3): qty 250

## 2020-11-14 MED ORDER — SODIUM CHLORIDE 0.9 % IV SOLN
250.0000 mL | INTRAVENOUS | Status: DC
  Administered 2020-11-15 – 2020-11-24 (×2): 250 mL via INTRAVENOUS

## 2020-11-14 MED ORDER — DOCUSATE SODIUM 50 MG/5ML PO LIQD
100.0000 mg | Freq: Two times a day (BID) | ORAL | Status: DC
  Administered 2020-11-15 – 2020-11-18 (×7): 100 mg
  Filled 2020-11-14 (×7): qty 10

## 2020-11-14 MED ORDER — FENTANYL BOLUS VIA INFUSION
25.0000 ug | INTRAVENOUS | Status: DC | PRN
Start: 2020-11-14 — End: 2020-11-18
  Administered 2020-11-15 – 2020-11-16 (×5): 50 ug via INTRAVENOUS
  Administered 2020-11-16 (×2): 25 ug via INTRAVENOUS
  Administered 2020-11-16: 50 ug via INTRAVENOUS
  Administered 2020-11-16 – 2020-11-17 (×2): 25 ug via INTRAVENOUS
  Administered 2020-11-17 (×2): 50 ug via INTRAVENOUS
  Administered 2020-11-17 (×2): 25 ug via INTRAVENOUS
  Filled 2020-11-14: qty 100

## 2020-11-14 MED ORDER — DOCUSATE SODIUM 100 MG PO CAPS: 100.0000 mg | ORAL_CAPSULE | Freq: Two times a day (BID) | ORAL | Status: DC | PRN

## 2020-11-14 MED ORDER — FENTANYL CITRATE (PF) 100 MCG/2ML IJ SOLN: 25.0000 ug | INTRAMUSCULAR | Status: DC | PRN

## 2020-11-14 NOTE — Consult Note (Signed)
NAME:  Kathleen [name], MRN:  [id number], DOB:  1945/09/12, LOS: 3 ADMISSION DATE:  11/16/2020, CONSULTATION DATE:  4/9 REFERRING MD:  Dr. McDiarmid, CHIEF COMPLAINT:  Pulmonary hypertension  History of Present Illness:  75 year old female with PMH as below, which is significant for ESRD, HFpEF, CVA, DM, CAD and tobacco abuse. She presented to [facility] ED 4/5 with complaints of progressive weakness x 2 week and has missed HD for the entire week prior to presentation. No other specific symptoms at the time of admission. In the emergency department she was found to have hypercarbic respiratory failure and was started on BiPAP. It was felt that with HD she would improve. She largely did improve with HD, despite difficulty using NIMV nocturnally.  Echocardiogram was done as part of the workup and showed markedly elevated pulmonary artery systolic pressure of 42-35%. PCCM was consulted for new diagnosis of pulmonary hypertension.   Pertinent  Medical History   has a past medical history of Acute CVA (cerebrovascular accident) ([place]) (11/08/2017), Acute respiratory failure with hypercapnia ([place]), Acute respiratory failure with hypoxia ([place]) (01/29/2019), Altered mental status (07/14/2017), Anemia, Arthritis, Asthma, Benign neoplasm of ascending colon, Benign neoplasm of cecum, Benign neoplasm of sigmoid colon, BPPV (benign paroxysmal positional vertigo) (01/22/2014), CAD (coronary artery disease), CVA (cerebral vascular accident) ([place]) (11/09/2017), Diabetes mellitus without complication ([place]), Disorientation, ESRD (end stage renal disease) ([place]), Gastritis and gastroduodenitis, Hematochezia, History of echocardiogram, transfusion, Hyperlipidemia, Hypernatremia, Hypertension, Hypertensive urgency (01/21/2014), Meningioma ([place]), Noncompliance with medication regimen, Obesity, Pleural effusion, left, Pulmonary edema (01/29/2019), Pulmonary hypertension due to sleep-disordered breathing ([place]) (11/14/2020), Pulmonary vascular  congestion, Rectal bleeding (09/01/2018), Renal insufficiency, Syncope and collapse (36/08/4429), Systolic and diastolic CHF, acute ([place]), Systolic CHF ([place]), and Tobacco abuse (01/21/2014).   Significant Hospital Events: Including procedures, antibiotic start and stop dates in addition to other pertinent events   . 4/4 admit  . 4/8 Echo LVEF 60-65%. RV systolic function normal. Mod/sev mitral stenosis, mod TR.   Interim History / Subjective:    Objective   Blood pressure (!) 111/39, pulse 86, temperature 99.5 F (37.5 C), temperature source Oral, resp. rate 19, height 5\' 3"  (1.6 m), weight 85.2 kg, SpO2 98 %.    FiO2 (%):  [28 %] 28 %   Intake/Output Summary (Last 24 hours) at 11/14/2020 1911 Last data filed at 11/14/2020 1031 Gross per 24 hour  Intake 50 ml  Output 600 ml  Net -550 ml   Filed Weights   11/14/20 0707 11/14/20 1015 11/14/20 1200  Weight: 84.2 kg 84.3 kg 85.2 kg    Examination: General: Obese elderly female in NAD HENT: [place]/AT, PERRL, MMM Lungs: Clear bilateral breath sounds Cardiovascular: RRR, no MRG Abdomen: Soft, non-tender, non-distended Extremities: No acute deformity or ROM limitatoin Neuro: Somnolent. Arouses to tactile stimuli  Labs/imaging that I have personally reviewed  (right click and "Reselect all SmartList Selections" daily)  ECHO: LVEF 60-65%. RV systolic function normal. Mod/sev mitral stenosis, mod TR.  Resolved Hospital Problem list     Assessment & Plan:   Pulmonary hypertension as identified of transthoracic echocardiogram 4/8. Right ventricular systolic pressure is estimated at 79.6. She has a history concerning for group 2 disease in the setting of known hypertension, HFpEF, and valvular heart disease (mod to severe mitral stenosis). Also concern for group 3 disease considering likely undiagnosed OSA/OHS. Acute hypercapnic respiratory failure  - Supplemental O2 as indicated for goal SpO2 88-95% - Will need outpatient sleep study to  determine chronic need  for NIV - Serum bicarb has normalized with treatment so it is unlikely she will qualify without a sleep study.  - CTA chest would also be beneficial to evaluate for thromboembolic disease and parenchymal lung disease. Would need to time with HD.   - Optimization of volume status. HD somewhat limited by hypotension. - Optimization of HFpEF, valvular - Consideration of right heart catheterization once the above are addressed.  - Will be happy to arrange pulmonary follow up as she nears discharge.   Nausea/vomiting - zofran x 1. - troponin and EKG to rule out cardiac ischemia - Primary team notified - At risk aspiration.  - Hold off on BiPAP for now  Best practice (right click and "Reselect all SmartList Selections" daily)  Diet:  Oral Pain/Anxiety/Delirium protocol (if indicated): No VAP protocol (if indicated): Not indicated DVT prophylaxis: Subcutaneous Heparin GI prophylaxis: N/A Glucose control:  SSI No Central venous access:  N/A Arterial line:  N/A Foley:  N/A Mobility:  bed rest  PT consulted: N/A Last date of multidisciplinary goals of care discussion [ ]  Code Status:  full code Disposition: Tele  Labs   CBC: Recent Labs  Lab 11/26/2020 1110 11/22/2020 1210 11/11/20 0018 11/12/20 0431  WBC 5.1  --  4.8 5.1  NEUTROABS 3.3  --   --   --   HGB 12.6 14.6 11.5* 10.9*  HCT 43.3 43.0 39.9 37.7  MCV 106.4*  --  107.5* 108.6*  PLT 193  --  154 798    Basic Metabolic Panel: Recent Labs  Lab 11/18/2020 1110 11/28/2020 1210 11/11/20 0017 11/11/20 1037 11/12/20 0431 11/13/20 0103  NA 139 138 139 136 133* 137  K 5.8* 5.6* 6.2* 4.3 4.7 4.2  CL 98  --  100 99 96* 98  CO2 31  --  27 32 31 26  GLUCOSE 61*  --  93 82 160* 94  BUN 55*  --  57* 17 27* 11  CREATININE 8.83*  --  9.05* 4.58* 5.85* 3.69*  CALCIUM 9.3  --  8.7* 7.6* 8.2* 8.2*  MG 2.7*  --   --   --   --   --   PHOS  --   --  8.3*  --   --   --    GFR: Estimated Creatinine Clearance: 13.4  mL/min (A) (by C-G formula based on SCr of 3.69 mg/dL (H)). Recent Labs  Lab 11/27/2020 1110 11/11/20 0018 11/11/20 1522 11/12/20 0431  WBC 5.1 4.8  --  5.1  LATICACIDVEN  --   --  0.9  --     Liver Function Tests: Recent Labs  Lab 11/30/2020 1110 11/11/20 0017  AST 23  --   ALT 19  --   ALKPHOS 66  --   BILITOT 0.4  --   PROT 6.8  --   ALBUMIN 3.5 3.0*   Recent Labs  Lab 11/23/2020 1110  LIPASE 33   No results for input(s): AMMONIA in the last 168 hours.  ABG    Component Value Date/Time   PHART 7.234 (L) 11/11/2020 0948   PCO2ART 77.1 (HH) 11/11/2020 0948   PO2ART 90.5 11/11/2020 0948   HCO3 32.6 (H) 11/11/2020 2146   TCO2 33 (H) 11/20/2020 1210   ACIDBASEDEF 1.2 08/06/2015 1430   O2SAT 73.9 11/11/2020 2146     Coagulation Profile: No results for input(s): INR, PROTIME in the last 168 hours.  Cardiac Enzymes: No results for input(s): CKTOTAL, CKMB, CKMBINDEX, TROPONINI in the last 168  hours.  HbA1C: Hgb A1c MFr Bld  Date/Time Value Ref Range Status  11/11/2020 12:17 AM 8.0 (H) 4.8 - 5.6 % Final    Comment:    (NOTE) Pre diabetes:          5.7%-6.4%  Diabetes:              >6.4%  Glycemic control for   <7.0% adults with diabetes   09/01/2018 11:45 AM 9.1 (H) 4.8 - 5.6 % Final    Comment:    (NOTE) Pre diabetes:          5.7%-6.4% Diabetes:              >6.4% Glycemic control for   <7.0% adults with diabetes     CBG: Recent Labs  Lab 11/13/20 1634 11/13/20 2104 11/14/20 0612 11/14/20 1118 11/14/20 1602  GLUCAP 122* 121* 95 80 107*    Review of Systems:   Bolds are positive  Constitutional: weight loss, gain, night sweats, Fevers, chills, fatigue .  HEENT: headaches, Sore throat, sneezing, nasal congestion, post nasal drip, Difficulty swallowing, Tooth/dental problems, visual complaints visual changes, ear ache CV:  chest pain, radiates:,Orthopnea, PND, swelling in lower extremities, dizziness, palpitations, syncope.  GI  heartburn,  indigestion, abdominal pain, nausea, vomiting, diarrhea, change in bowel habits, loss of appetite, bloody stools.  Resp: cough, productive: , hemoptysis, dyspnea, chest pain, pleuritic.  Skin: rash or itching or icterus GU: dysuria, change in color of urine, urgency or frequency. flank pain, hematuria  MS: joint pain or swelling. decreased range of motion  Psych: change in mood or affect. depression or anxiety.  Neuro: difficulty with speech, weakness, numbness, ataxia    Past Medical History:  She,  has a past medical history of Acute CVA (cerebrovascular accident) ([place]) (11/08/2017), Acute respiratory failure with hypercapnia ([place]), Acute respiratory failure with hypoxia ([place]) (01/29/2019), Altered mental status (07/14/2017), Anemia, Arthritis, Asthma, Benign neoplasm of ascending colon, Benign neoplasm of cecum, Benign neoplasm of sigmoid colon, BPPV (benign paroxysmal positional vertigo) (01/22/2014), CAD (coronary artery disease), CVA (cerebral vascular accident) ([place]) (11/09/2017), Diabetes mellitus without complication ([place]), Disorientation, ESRD (end stage renal disease) ([place]), Gastritis and gastroduodenitis, Hematochezia, History of echocardiogram, transfusion, Hyperlipidemia, Hypernatremia, Hypertension, Hypertensive urgency (01/21/2014), Meningioma ([place]), Noncompliance with medication regimen, Obesity, Pleural effusion, left, Pulmonary edema (01/29/2019), Pulmonary hypertension due to sleep-disordered breathing ([place]) (11/14/2020), Pulmonary vascular congestion, Rectal bleeding (09/01/2018), Renal insufficiency, Syncope and collapse (04/09/3299), Systolic and diastolic CHF, acute ([place]), Systolic CHF ([place]), and Tobacco abuse (01/21/2014).   Surgical History:   Past Surgical History:  Procedure Laterality Date  . ABDOMINAL HYSTERECTOMY    . AV FISTULA PLACEMENT Left 02/10/2017   Procedure: ARTERIOVENOUS (AV) FISTULA CREATION-LEFT FOREARM;  Surgeon: Elam Dutch, MD;  Location: [place];  Service:  Vascular;  Laterality: Left;  . BIOPSY  09/04/2018   Procedure: BIOPSY;  Surgeon: Jerene Bears, MD;  Location: [facility] ENDOSCOPY;  Service: Gastroenterology;;  . BREAST SURGERY Right    Lumpectomy  . COLONOSCOPY    . COLONOSCOPY WITH PROPOFOL N/A 09/04/2018   Procedure: COLONOSCOPY WITH PROPOFOL;  Surgeon: Jerene Bears, MD;  Location: [place];  Service: Gastroenterology;  Laterality: N/A;  . ESOPHAGOGASTRODUODENOSCOPY (EGD) WITH PROPOFOL N/A 09/04/2018   Procedure: ESOPHAGOGASTRODUODENOSCOPY (EGD) WITH PROPOFOL;  Surgeon: Jerene Bears, MD;  Location[facility] ENDOSCOPY;  Service: Gastroenterology;  Laterality: N/A;  . FISTULA SUPERFICIALIZATION Left 08/09/2017   Procedure: FISTULA SUPERFICIALIZATION LEFT ARM ARTERIOVENOUS FISTULA;  Surgeon: [place] Orion, MD;  Location: [facility]  OR;  Service: Vascular;  Laterality: Left;  . INSERTION OF DIALYSIS CATHETER Right 02/10/2017   Procedure: INSERTION OF DIALYSIS CATHETER;  Surgeon: Elam Dutch, MD;  Location: [place];  Service: Vascular;  Laterality: Right;  . LEFT HEART CATHETERIZATION WITH CORONARY ANGIOGRAM N/A 01/27/2014   Procedure: LEFT HEART CATHETERIZATION WITH CORONARY ANGIOGRAM;  Surgeon: Jettie Booze, MD;  Location[facility] CATH LAB;  Service: Cardiovascular;  Laterality: N/A;  . POLYPECTOMY  09/04/2018   Procedure: POLYPECTOMY;  Surgeon: Jerene Bears, MD;  Location: [facility] ENDOSCOPY;  Service: Gastroenterology;;  . TONSILLECTOMY       Social History:   reports that she has quit smoking. Her smoking use included cigarettes. She has a 7.50 pack-year smoking history. She has never used smokeless tobacco. She reports that she does not drink alcohol and does not use drugs.   Family History:  Her family history includes Diabetic kidney disease in her mother; Heart attack in her mother; Heart failure in her mother; Hyperlipidemia in her mother; Hypertension in her father and mother.   Allergies Allergies  Allergen Reactions  . Baclofen Other (See  Comments)    Severe delirium when given to pateint in Dec 2018.   . Chlorhexidine Rash     Home Medications  Prior to Admission medications   Medication Sig Start Date End Date Taking? Authorizing Provider  acetaminophen (TYLENOL) 325 MG tablet Take 2 tablets (650 mg total) by mouth every 4 (four) hours as needed for mild pain (or temp > 37.5 C (99.5 F)). Patient taking differently: Take 650 mg by mouth 2 (two) times daily. 11/11/17  Yes [name], MD  aspirin EC 81 MG tablet Take 81 mg by mouth daily. Swallow whole.   Yes [provider]  atorvastatin (LIPITOR) 40 MG tablet Take 1 tablet (40 mg total) by mouth daily at 6 PM. 07/19/17  Yes Aline August, MD  calcium acetate (PHOSLO) 667 MG capsule Take 1 capsule (667 mg total) by mouth as needed (with snacks). Patient taking differently: Take 667-2,001 mg by mouth See admin instructions. Take 3 capsules (2001 mg totally) by mouth 3 times daily with a meal; Take 1 tablet (667 mg totally) by mouth 2 times daily with snacks 11/11/17  Yes [name], [name], MD  carvedilol (COREG) 25 MG tablet Take 25 mg by mouth 2 (two) times daily. 03/28/17  Yes [provider]  hydrALAZINE (APRESOLINE) 25 MG tablet Take 1 tablet (25 mg total) by mouth every 8 (eight) hours as needed (SBP > 170  or DBP > 110). Patient taking differently: Take 50 mg by mouth daily. 02/20/17  Yes Mikhail, Velta [place], DO  Insulin Glargine (BASAGLAR KWIKPEN) 100 UNIT/ML Inject 30 Units into the skin at bedtime. 10/07/20  Yes [provider]  mirtazapine (REMERON) 7.5 MG tablet Take 7.5 mg by mouth at bedtime. 10/07/20  Yes [provider]  Multiple Vitamins-Minerals (RENAPLEX) TABS Take 1 tablet by mouth daily. 07/10/18  Yes [provider]  pantoprazole (PROTONIX) 40 MG tablet Take 1 tablet (40 mg total) by mouth 2 (two) times daily before a meal. Take 1 tablet twice daily for a  Month then once dailly Patient taking differently: Take 40 mg by mouth  daily. 09/04/18  Yes [name], Vella [place], MD  polyethylene glycol (MIRALAX / GLYCOLAX) packet Take 17 g by mouth daily. Patient taking differently: Take 17 g by mouth daily as needed for mild constipation. 02/20/17  Yes Mikhail, Velta [place], DO  triamcinolone cream (KENALOG) 0.5 % Apply  1 application topically 2 (two) times daily as needed for rash. 10/07/20  Yes [provider]  brimonidine-timolol (COMBIGAN) 0.2-0.5 % ophthalmic solution Place 1 drop into both eyes every 12 (twelve) hours.    [provider]      Georgann Housekeeper, AGACNP-BC [facility]  See Amion for personal pager PCCM on call pager [phone] until 7pm. Please call Elink 7p-7a. [phone]  11/14/2020 9:26 PM

## 2020-11-14 NOTE — Progress Notes (Signed)
Patient being evaluated by 2 MDs (piulmonology); upon evaluation patient began to vomit and appeared lethargic. Suctioned patient and elevated HOB. RN with MDs at bedside, Family Medicine MD notified

## 2020-11-14 NOTE — Progress Notes (Signed)
FPTS Interim Progress Note  S: Notified by Mr. Hoffman, CCM, re: patient somnolent with nausea/vomiting. Received report from RN that patient was agitated throughout the day, but had been sleeping since the start of shift. She is known to get agitated and also have bouts of somnolence, thought to be related to hypercarbia and undiagnosed possible OSA/OSH.   O: BP (!) 121/49 (BP Location: Right Arm)   Pulse 78   Temp 99.5 F (37.5 C) (Oral)   Resp 18   Ht 5\' 3"  (1.6 m)   Wt 85.2 kg   LMP  (LMP Unknown)   SpO2 98%   BMI 33.27 kg/m   Gen: elderly AAW, NAD, sitting up in bed Cardiac: RRR, no m/r/g, 2+ radial pulses, 2+ DP pulses Lungs: CTAB Neuro: somnolent, arouses to tactile stimulation, not oriented  A/P:  Nausea/Vomiting Patient with hypercapnic respiratory failure, likely source of somnolence. Discussed with attending, Dr. McDiarmid. Will obtain ABG to ensure she is not worsening, presently not a candidate for BIPAP as she is at risk for aspiration.  -Obtain ABG -EKG with normal QTc, zofran 4 mg IV given -EKG with mild t wave changes in lateral leads -Obtain troponin to r/o cardiac ischemia -Will continue to monitor closely  Gladys Damme, MD 11/14/2020, 9:57 PM PGY-2, [place] Medicine Service pager [phone]

## 2020-11-14 NOTE — Progress Notes (Signed)
Patient transported to MICU bed 07 with RT, Rapid response RN, and bedside RN. No events during transport.

## 2020-11-14 NOTE — H&P (Incomplete)
NAME:  Kathleen [name], MRN:  [id number], DOB:  January 24, 1946, LOS: 3 ADMISSION DATE:  11/27/2020, CONSULTATION DATE:  11/14/20 REFERRING MD:  Gladys Damme, MD CHIEF COMPLAINT:  Unresponsive, vomiting, hypercapnic  Brief History   ***  History of present illness   Ms. Kathleen [name] is a 75 year old female with PMH significant for ESRD, HFpEF, CVA, DM, CAD and tobacco abuse. She presented to [facility] ED 4/5 with complaints of progressive weakness x 2 week and has missed HD for the entire week prior to presentation. No other specific symptoms at the time of admission. In the emergency department she was found to have hypercarbic respiratory failure and was started on BiPAP. It was felt that with HD she would improve. She largely did improve with HD, despite difficulty using NIMV nocturnally.  Echocardiogram was done as part of the workup and showed markedly elevated pulmonary artery systolic pressure of 93-81%. PCCM was consulted earlier on 11/14/2020 for possible workup of pulmonary hypertension.  Later at night on 11/14/2020, the patient had worsened mental status, essentially obtunded and with multiple episodes of vomiting, blood gas   Past Medical History   Past Medical History:  Diagnosis Date  . Acute CVA (cerebrovascular accident) ([place]) 11/08/2017   Brain MRI 11/08/2017:Two subcentimeter lacunar infarctions in the left corona radiata. These are probably early subacute in age    . Acute respiratory failure with hypercapnia ([place])   . Acute respiratory failure with hypoxia ([place]) 01/29/2019  . Altered mental status 07/14/2017  . Anemia    a. 01/2014: suspected of chronic disease, negative FOBT.  [name]    knees  . Asthma    many years ago  . Benign neoplasm of ascending colon   . Benign neoplasm of cecum   . Benign neoplasm of sigmoid colon   . BPPV (benign paroxysmal positional vertigo) 01/22/2014  . CAD (coronary artery disease)    a. Cath 01/2014: mild in LAD, mild-mod LCx, mod-severe  RCA - for med rx.  . CVA (cerebral vascular accident) ([place]) 11/09/2017   Brain MRI: Two subcentimeter lacunar infarctions in the left corona radiata. These are probably early subacute  . Diabetes mellitus without complication ([place])   . Disorientation   . ESRD (end stage renal disease) ([place])    [place]  . Gastritis and gastroduodenitis   . Hematochezia   . History of echocardiogram    Echo (10/15):  Mod LVH, EF 50-55%, Gr 1 DD, MAC, mild MR, mild TR, PASP 35 mmHg  . Hx of transfusion   . Hyperlipidemia   . Hypernatremia   . Hypertension   . Hypertensive urgency 01/21/2014  . Meningioma ([place])    a. Incidental dx 01/2014.  [name] Noncompliance with medication regimen   . Obesity   . Pleural effusion, left   . Pulmonary edema 01/29/2019  . Pulmonary hypertension due to sleep-disordered breathing ([place]) 11/14/2020  . Pulmonary vascular congestion   . Rectal bleeding 09/01/2018  . Renal insufficiency   . Syncope and collapse 06/10/2014  . Systolic and diastolic CHF, acute ([place])   . Systolic CHF ([place])    a. 0/1751: dx with mixed ICM/NICM (out of proportion to CAD) EF 25-30% by echo.   . Tobacco abuse 01/21/2014   Tobacco abuse    Significant Hospital Events   Transferred to ICU 11/14/2020 for acute hypercapnic respiratory failure and obtunded  Consults:  PCCM 11/14/2020  Procedures:  None   Significant Diagnostic Tests:  ***  Micro Data:  ***  Antimicrobials:  ***   Interim history/subjective:  ***  Objective   Blood pressure (!) 121/49, pulse 78, temperature 99.5 F (37.5 C), temperature source Oral, resp. rate 18, height _0  (1.6 m), weight 85.2 kg, SpO2 98 %.        Intake/Output Summary (Last 24 hours) at 11/14/2020 2334 Last data filed at 11/14/2020 1031 Gross per 24 hour  Intake 50 ml  Output 600 ml  Net -550 ml   Filed Weights   11/14/20 0707 11/14/20 1015 11/14/20 1200  Weight: 84.2 kg 84.3 kg 85.2 kg    Examination: General: *** HENT:  *** Lungs: *** Cardiovascular: *** Abdomen: *** Extremities: *** Neuro: *** GU: ***  Resolved Hospital Problem list   ***  Assessment & Plan:  ***  Best practice:  Diet: NPO Pain/Anxiety/Delirium protocol (if indicated): Fentanyl, propofol VAP protocol (if indicated): HOB, oral care DVT prophylaxis: SQ heparin GI prophylaxis: Famotidine Glucose control:  Mobility: bedrest Code Status: full  Family Communication: son made aware of ICU transfer and need for intubation Disposition: pending recovery/extubation   Labs   CBC: Recent Labs  Lab 11/13/2020 1110 12/03/2020 1210 11/11/20 0018 11/12/20 0431 11/14/20 2144  WBC 5.1  --  4.8 5.1 6.6  NEUTROABS 3.3  --   --   --   --   HGB 12.6 14.6 11.5* 10.9* 11.0*  HCT 43.3 43.0 39.9 37.7 39.0  MCV 106.4*  --  107.5* 108.6* 109.2*  PLT 193  --  154 152 131*    Basic Metabolic Panel: Recent Labs  Lab 12/02/2020 1110 11/21/2020 1210 11/11/20 0017 11/11/20 1037 11/12/20 0431 11/13/20 0103 11/14/20 2144  NA 139   < > 139 136 133* 137 135  K 5.8*   < > 6.2* 4.3 4.7 4.2 3.9  CL 98  --  100 99 96* 98 97*  CO2 31  --  27 32 _1 GLUCOSE 61*  --  93 82 160* 94 75  BUN 55*  --  57* 17 27* 11 14  CREATININE 8.83*  --  9.05* 4.58* 5.85* 3.69* 4.04*  CALCIUM 9.3  --  8.7* 7.6* 8.2* 8.2* 9.2  MG 2.7*  --   --   --   --   --   --   PHOS  --   --  8.3*  --   --   --   --    < > = values in this interval not displayed.   GFR: Estimated Creatinine Clearance: 12.2 mL/min (A) (by C-G formula based on SCr of 4.04 mg/dL (H)). Recent Labs  Lab 11/09/2020 1110 11/11/20 0018 11/11/20 1522 11/12/20 0431 11/14/20 2144  WBC 5.1 4.8  --  5.1 6.6  LATICACIDVEN  --   --  0.9  --   --     Liver Function Tests: Recent Labs  Lab 11/17/2020 1110 11/11/20 0017  AST 23  --   ALT 19  --   ALKPHOS 66  --   BILITOT 0.4  --   PROT 6.8  --   ALBUMIN 3.5 3.0*   Recent Labs  Lab 11/22/2020 1110  LIPASE 33   No results for input(s):  AMMONIA in the last 168 hours.  ABG    Component Value Date/Time   PHART 7.084 (LL) 11/14/2020 2230   PCO2ART 104 (HH) 11/14/2020 2230   PO2ART 50.6 (L) 11/14/2020 2230   HCO3 29.8 (H) 11/14/2020 2230   TCO2 33 (  H) 11/20/2020 1210   ACIDBASEDEF 1.2 08/06/2015 1430   O2SAT 82.7 11/14/2020 2230     Coagulation Profile: No results for input(s): INR, PROTIME in the last 168 hours.  Cardiac Enzymes: No results for input(s): CKTOTAL, CKMB, CKMBINDEX, TROPONINI in the last 168 hours.  HbA1C: Hgb A1c MFr Bld  Date/Time Value Ref Range Status  11/11/2020 12:17 AM 8.0 (H) 4.8 - 5.6 % Final    Comment:    (NOTE) Pre diabetes:          5.7%-6.4%  Diabetes:              >6.4%  Glycemic control for   <7.0% adults with diabetes   09/01/2018 11:45 AM 9.1 (H) 4.8 - 5.6 % Final    Comment:    (NOTE) Pre diabetes:          5.7%-6.4% Diabetes:              >6.4% Glycemic control for   <7.0% adults with diabetes     CBG: Recent Labs  Lab 11/13/20 2104 11/14/20 0612 11/14/20 1118 11/14/20 1602 11/14/20 2109  GLUCAP 121* 95 80 107* 74    Review of Systems:   ***  Past Medical History  She,  has a past medical history of Acute CVA (cerebrovascular accident) ([place]) (11/08/2017), Acute respiratory failure with hypercapnia ([place]), Acute respiratory failure with hypoxia ([place]) (01/29/2019), Altered mental status (07/14/2017), Anemia, Arthritis, Asthma, Benign neoplasm of ascending colon, Benign neoplasm of cecum, Benign neoplasm of sigmoid colon, BPPV (benign paroxysmal positional vertigo) (01/22/2014), CAD (coronary artery disease), CVA (cerebral vascular accident) ([place]) (11/09/2017), Diabetes mellitus without complication ([place]), Disorientation, ESRD (end stage renal disease) ([place]), Gastritis and gastroduodenitis, Hematochezia, History of echocardiogram, transfusion, Hyperlipidemia, Hypernatremia, Hypertension, Hypertensive urgency (01/21/2014), Meningioma ([place]), Noncompliance with medication  regimen, Obesity, Pleural effusion, left, Pulmonary edema (01/29/2019), Pulmonary hypertension due to sleep-disordered breathing ([place]) (11/14/2020), Pulmonary vascular congestion, Rectal bleeding (09/01/2018), Renal insufficiency, Syncope and collapse (19/10/7900), Systolic and diastolic CHF, acute ([place]), Systolic CHF ([place]), and Tobacco abuse (01/21/2014).   Surgical History    Past Surgical History:  Procedure Laterality Date  . ABDOMINAL HYSTERECTOMY    . AV FISTULA PLACEMENT Left 02/10/2017   Procedure: ARTERIOVENOUS (AV) FISTULA CREATION-LEFT FOREARM;  Surgeon: Elam Dutch, MD;  Location: [facility];  Service: Vascular;  Laterality: Left;  . BIOPSY  09/04/2018   Procedure: BIOPSY;  Surgeon: Jerene Bears, MD;  Location[facility] ENDOSCOPY;  Service: Gastroenterology;;  . BREAST SURGERY Right    Lumpectomy  . COLONOSCOPY    . COLONOSCOPY WITH PROPOFOL N/A 09/04/2018   Procedure: COLONOSCOPY WITH PROPOFOL;  Surgeon: Jerene Bears, MD;  Location: [facility];  Service: Gastroenterology;  Laterality: N/A;  . ESOPHAGOGASTRODUODENOSCOPY (EGD) WITH PROPOFOL N/A 09/04/2018   Procedure: ESOPHAGOGASTRODUODENOSCOPY (EGD) WITH PROPOFOL;  Surgeon: Jerene Bears, MD;  Location: [facility] ENDOSCOPY;  Service: Gastroenterology;  Laterality: N/A;  . FISTULA SUPERFICIALIZATION Left 08/09/2017   Procedure: FISTULA SUPERFICIALIZATION LEFT ARM ARTERIOVENOUS FISTULA;  Surgeon: Conrad Selfridge, MD;  Location: [facility];  Service: Vascular;  Laterality: Left;  . INSERTION OF DIALYSIS CATHETER Right 02/10/2017   Procedure: INSERTION OF DIALYSIS CATHETER;  Surgeon: Elam Dutch, MD;  Location: [facility];  Service: Vascular;  Laterality: Right;  . LEFT HEART CATHETERIZATION WITH CORONARY ANGIOGRAM N/A 01/27/2014   Procedure: LEFT HEART CATHETERIZATION WITH CORONARY ANGIOGRAM;  Surgeon: Jettie Booze, MD;  Location[facility] CATH LAB;  Service: Cardiovascular;  Laterality: N/A;  . POLYPECTOMY  09/04/2018  Procedure: POLYPECTOMY;  Surgeon: Jerene Bears, MD;  Location: [facility] ENDOSCOPY;  Service: Gastroenterology;;  . TONSILLECTOMY       Social History   reports that she has quit smoking. Her smoking use included cigarettes. She has a 7.50 pack-year smoking history. She has never used smokeless tobacco. She reports that she does not drink alcohol and does not use drugs.   Family History   Her family history includes Diabetic kidney disease in her mother; Heart attack in her mother; Heart failure in her mother; Hyperlipidemia in her mother; Hypertension in her father and mother.   Allergies Allergies  Allergen Reactions  . Baclofen Other (See Comments)    Severe delirium when given to pateint in Dec 2018.   . Chlorhexidine Rash     Home Medications  Prior to Admission medications   Medication Sig Start Date End Date Taking? Authorizing Provider  acetaminophen (TYLENOL) 325 MG tablet Take 2 tablets (650 mg total) by mouth every 4 (four) hours as needed for mild pain (or temp > 37.5 C (99.5 F)). Patient taking differently: Take 650 mg by mouth 2 (two) times daily. 11/11/17  Yes [name], MD  aspirin EC 81 MG tablet Take 81 mg by mouth daily. Swallow whole.   Yes [provider]  atorvastatin (LIPITOR) 40 MG tablet Take 1 tablet (40 mg total) by mouth daily at 6 PM. 07/19/17  Yes Aline August, MD  calcium acetate (PHOSLO) 667 MG capsule Take 1 capsule (667 mg total) by mouth as needed (with snacks). Patient taking differently: Take 667-2,001 mg by mouth See admin instructions. Take 3 capsules (2001 mg totally) by mouth 3 times daily with a meal; Take 1 tablet (667 mg totally) by mouth 2 times daily with snacks 11/11/17  Yes [name], [name], MD  carvedilol (COREG) 25 MG tablet Take 25 mg by mouth 2 (two) times daily. 03/28/17  Yes [provider]  hydrALAZINE (APRESOLINE) 25 MG tablet Take 1 tablet (25 mg total) by mouth every 8 (eight) hours as needed (SBP > 170  or DBP > 110). Patient taking differently: Take 50 mg by  mouth daily. 02/20/17  Yes Mikhail, Velta Addison, DO  Insulin Glargine (BASAGLAR KWIKPEN) 100 UNIT/ML Inject 30 Units into the skin at bedtime. 10/07/20  Yes [provider]  mirtazapine (REMERON) 7.5 MG tablet Take 7.5 mg by mouth at bedtime. 10/07/20  Yes [provider]  Multiple Vitamins-Minerals (RENAPLEX) TABS Take 1 tablet by mouth daily. 07/10/18  Yes [provider]  pantoprazole (PROTONIX) 40 MG tablet Take 1 tablet (40 mg total) by mouth 2 (two) times daily before a meal. Take 1 tablet twice daily for a  Month then once dailly Patient taking differently: Take 40 mg by mouth daily. 09/04/18  Yes [name], Vella Redhead, MD  polyethylene glycol (MIRALAX / GLYCOLAX) packet Take 17 g by mouth daily. Patient taking differently: Take 17 g by mouth daily as needed for mild constipation. 02/20/17  Yes Mikhail, Velta Addison, DO  triamcinolone cream (KENALOG) 0.5 % Apply 1 application topically 2 (two) times daily as needed for rash. 10/07/20  Yes [provider]  brimonidine-timolol (COMBIGAN) 0.2-0.5 % ophthalmic solution Place 1 drop into both eyes every 12 (twelve) hours.    [provider]     Critical care time: 55 minutes    ___________________ Samuella Cota. Reinaldo Berber, MD [facility]

## 2020-11-14 NOTE — Progress Notes (Signed)
FPTS Interim Progress Note  S: Paged by RN to bedside. Patient desatted to 60s, HR with bradycardia to 50s. Had another round of emesis. RNs sat patient up and suctioned, now SpO2 returned to 97%.   O: BP (!) 121/49 (BP Location: Right Arm)   Pulse 78   Temp 99.5 F (37.5 C) (Oral)   Resp 18   Ht 5\' 3"  (1.6 m)   Wt 85.2 kg   LMP  (LMP Unknown)   SpO2 98%   BMI 33.27 kg/m   Gen: elderly AAW Cardiac: RRR, no m/r/g, 2+ radial pulses Lungs: Referred upper airway sounds, clear at bases Neuro: somnolent, not arousable to voice or tactile stimuli  A/P: N/V  Hypercapnic respiratory failure ABG just returned with the following: 7.084/104/50.6/29.8. Patient has become increasingly more somnolent with worsening hypercapnic respiratory failure. Due to emesis and somnolence, not a candidate for BIPAP. Attending, Dr. McDiarmid, notified. Called and consulted CCM- intensivist on call. Recommend transfer to ICU. Rapid response team present. Transfer order placed, bed request in. I will call family and inform of changes.  Gladys Damme, MD 11/14/2020, 11:11 PM PGY-2, [facility] Service pager [phone]

## 2020-11-14 NOTE — H&P (Signed)
NAME:  Kathleen [name], MRN:  [id number], DOB:  1946-03-10, LOS: 3 ADMISSION DATE:  12/03/2020, CONSULTATION DATE:  11/14/20 REFERRING MD:  Gladys Damme, MD  CHIEF COMPLAINT:  Unresponsive, vomiting, hypercapnic  Brief History   75 year old female who presented to [facility] ED 4/5 with complaints of progressive weakness x 2 week and has missed HD for the entire week prior to presentation, responded to HD sessions.  However, she was non-compliant with nocturnal NIV. On 11/14/2020, the patient had worsened mental status, essentially obtunded and with multiple episodes of vomiting, blood gas showing 7.08/ 104/ 50.6/ 29.8.  She was urgently transferred to ICU where she was intubated.    History of present illness   Kathleen [name] is a 75 year old female with PMH significant for ESRD on HD TTS, HFpEF, CVA, DM, CAD, PAD, tobacco abuse and Covid in 07/2019. She presented to [facility] ED 4/5 with complaints of progressive weakness x 2 week and has missed HD for the entire week prior to presentation. No other specific symptoms at the time of admission. In the emergency department she was found to have hypercarbic respiratory failure and was started on BiPAP due to concern of undiagnosed OSA/OHS.  She denies any history of sleep apnea or needing to wear a CPAP or BiPAP at night.  It was felt that with HD she would improve. She largely did improve with HD, but was not tolerating NIV, repeatedly removing the mask.   Echocardiogram was done as part of the workup and showed markedly elevated pulmonary artery systolic pressure of 41-32%. PCCM was consulted earlier on 11/14/2020 for possible workup of pulmonary hypertension.    Later at night on 11/14/2020, the patient had worsened mental status, essentially obtunded and with multiple episodes of vomiting, blood gas showing 7.08/ 104/ 50.6/ 29.8.  She was urgently transferred to ICU where she was intubated.     Past Medical History   Past Medical History:   Diagnosis Date  . Acute CVA (cerebrovascular accident) ([place]) 11/08/2017   Brain MRI 11/08/2017:Two subcentimeter lacunar infarctions in the left corona radiata. These are probably early subacute in age    . Acute respiratory failure with hypercapnia ([place])   . Acute respiratory failure with hypoxia ([place]) 01/29/2019  . Altered mental status 07/14/2017  . Anemia    a. 01/2014: suspected of chronic disease, negative FOBT.  [name]    knees  . Asthma    many years ago  . Benign neoplasm of ascending colon   . Benign neoplasm of cecum   . Benign neoplasm of sigmoid colon   . BPPV (benign paroxysmal positional vertigo) 01/22/2014  . CAD (coronary artery disease)    a. Cath 01/2014: mild in LAD, mild-mod LCx, mod-severe RCA - for med rx.  . CVA (cerebral vascular accident) ([place]) 11/09/2017   Brain MRI: Two subcentimeter lacunar infarctions in the left corona radiata. These are probably early subacute  . Diabetes mellitus without complication ([place])   . Disorientation   . ESRD (end stage renal disease) ([place])    [place]  . Gastritis and gastroduodenitis   . Hematochezia   . History of echocardiogram    Echo (10/15):  Mod LVH, EF 50-55%, Gr 1 DD, MAC, mild MR, mild TR, PASP 35 mmHg  . Hx of transfusion   . Hyperlipidemia   . Hypernatremia   . Hypertension   . Hypertensive urgency 01/21/2014  . Meningioma ([place])    a.  Incidental dx 01/2014.  [name] Noncompliance with medication regimen   . Obesity   . Pleural effusion, left   . Pulmonary edema 01/29/2019  . Pulmonary hypertension due to sleep-disordered breathing ([place]) 11/14/2020  . Pulmonary vascular congestion   . Rectal bleeding 09/01/2018  . Renal insufficiency   . Syncope and collapse 06/10/2014  . Systolic and diastolic CHF, acute ([place])   . Systolic CHF ([place])    a. 02/1244: dx with mixed ICM/NICM (out of proportion to CAD) EF 25-30% by echo.   . Tobacco abuse 01/21/2014   Tobacco abuse    Significant Hospital Events    Transferred to ICU 11/14/2020 for acute hypercapnic respiratory failure and obtunded  Consults:  PCCM 11/14/2020  Procedures:  None   Significant Diagnostic Tests:  CXR 4/10: IMPRESSION: 1. Endotracheal tube tip is about 1.5 cm superior to carina. 2. Cardiomegaly with vascular congestion and probable trace pleural effusions.  CXR 4/5: IMPRESSION: Cardiomegaly.  No frank interstitial edema.  ECHO 4/8: LVEF 60-65%. RV systolic function normal. Mod/sev mitral stenosis, mod TR.  Micro Data:  Influenza A & B 4/5:  Negative SARS Covid 4/5:  Negative   Antimicrobials:  None   Interim history/subjective:  Transferred to the ICU and intubated.  Doing well on vent.  Briefly requiring levophed 76mg for drop in MAP after intubation but was off vasopressors within 10 minutes.  Objective   Blood pressure (!) 121/49, pulse 78, temperature 99.5 F (37.5 C), temperature source Oral, resp. rate 18, height _0  (1.6 m), weight 85.2 kg, SpO2 98 %.        Intake/Output Summary (Last 24 hours) at 11/14/2020 2334 Last data filed at 11/14/2020 1031 Gross per 24 hour  Intake 50 ml  Output 600 ml  Net -550 ml   Filed Weights   11/14/20 0707 11/14/20 1015 11/14/20 1200  Weight: 84.2 kg 84.3 kg 85.2 kg    Examination: General:  Obtunded, no response to verbal stimuli, and very little response to painful stimuli Eyes: PERRL, EOMI, no scleral icterus ENTM: dry MM, drooling from side of mouth, no obvious facial droop Neck: no LAD  Cardiovascular: RRR, no murmurs, bilateral radial pulses, 2+ pitting edema bilateral LEs Respiratory: Mildly coarse breath sounds, mildly labored Gastrointestinal: soft, nontender, +BS Derm: left lower extremity wrapped, right lower extremity with chronic stasis dermatitis changes with scattered weeping blisters Neuro: obtunded, not responding to verbal stimuli.   Right lower extremity    Resolved Hospital Problem list     Assessment & Plan:    Acute  on chronic hypoxic hypercapnic respiratory failure Aspiration pneumonitis  Admit to ICU  Mechanical ventilator support, in need of high minute ventilation at this time  Repeat ABG 1 hr after intubation (discussed with RT)  Observe closely for signs of pneumonia  VAP precautions with HOB > 30 deg, and oral hygiene, suctioning as needed  Sedation:  Fentanyl for now, add propofol if needed  Nausea and vomiting Lipase was 33. N/V may have been due to severe hypercapnia.  She could also have an ileus, don't suspect obstruction.  OG tube to low intermittent suction overnight for gastric decompression  Zofran as needed  Check AXR  Volume overload ESRD on HD TTS Missed dialysis in the setting of HFpEF  Continue hemodialysis  Appreciate renal consult   Possible Pulmonary hypertension Probable OSA/OHS RVP on TTE 4/8 was 79.6.  TTE is not a reliable measure for pulmonary hypertension particularly in the setting of some volume overload.  Right heart cath is required to diagnose pulmonary hypertension. Nevertheless, the patient has multiple risk factors for pulmonary hypertension including likely undiagnosed OSA, heart failure, and unclear if she has COPD.  I do not see a PFT on record.   After stabilization, outpatient workup likely best for possible PH including PFT with DLCO, right heart cath, sleep study, VQ, etc  Arrange outpatient pulmonary follow-up  CVA  Continue Aspirin  Continue lipitor   DM, type 2  Sliding scale insulin  CAD No signs of acute ischemic event based on Ekg.  Trend troponin   Continue aspirin  Continue Lipitor   Best practice:  Diet: NPO Pain/Anxiety/Delirium protocol (if indicated): Fentanyl, propofol VAP protocol (if indicated): HOB, oral care DVT prophylaxis: SQ heparin GI prophylaxis: Pantoprazole  Glucose control:  Mobility: bedrest Code Status: full  Family Communication: son made aware of ICU transfer and need for  intubation Disposition: pending recovery/extubation   Labs   CBC: Recent Labs  Lab 11/19/2020 1110 11/20/2020 1210 11/11/20 0018 11/12/20 0431 11/14/20 2144  WBC 5.1  --  4.8 5.1 6.6  NEUTROABS 3.3  --   --   --   --   HGB 12.6 14.6 11.5* 10.9* 11.0*  HCT 43.3 43.0 39.9 37.7 39.0  MCV 106.4*  --  107.5* 108.6* 109.2*  PLT 193  --  154 152 131*    Basic Metabolic Panel: Recent Labs  Lab 11/30/2020 1110 11/09/2020 1210 11/11/20 0017 11/11/20 1037 11/12/20 0431 11/13/20 0103 11/14/20 2144  NA 139   < > 139 136 133* 137 135  K 5.8*   < > 6.2* 4.3 4.7 4.2 3.9  CL 98  --  100 99 96* 98 97*  CO2 31  --  27 32 _0 GLUCOSE 61*  --  93 82 160* 94 75  BUN 55*  --  57* 17 27* 11 14  CREATININE 8.83*  --  9.05* 4.58* 5.85* 3.69* 4.04*  CALCIUM 9.3  --  8.7* 7.6* 8.2* 8.2* 9.2  MG 2.7*  --   --   --   --   --   --   PHOS  --   --  8.3*  --   --   --   --    < > = values in this interval not displayed.   GFR: Estimated Creatinine Clearance: 12.2 mL/min (A) (by C-G formula based on SCr of 4.04 mg/dL (H)). Recent Labs  Lab 11/06/2020 1110 11/11/20 0018 11/11/20 1522 11/12/20 0431 11/14/20 2144  WBC 5.1 4.8  --  5.1 6.6  LATICACIDVEN  --   --  0.9  --   --     Liver Function Tests: Recent Labs  Lab 11/09/2020 1110 11/11/20 0017  AST 23  --   ALT 19  --   ALKPHOS 66  --   BILITOT 0.4  --   PROT 6.8  --   ALBUMIN 3.5 3.0*   Recent Labs  Lab 12/02/2020 1110  LIPASE 33   No results for input(s): AMMONIA in the last 168 hours.  ABG    Component Value Date/Time   PHART 7.084 (LL) 11/14/2020 2230   PCO2ART 104 (HH) 11/14/2020 2230   PO2ART 50.6 (L) 11/14/2020 2230   HCO3 29.8 (H) 11/14/2020 2230   TCO2 33 (H) 11/12/2020 1210   ACIDBASEDEF 1.2 08/06/2015 1430   O2SAT 82.7 11/14/2020 2230     Coagulation Profile: No results for input(s): INR, PROTIME in the last 168  hours.  Cardiac Enzymes: No results for input(s): CKTOTAL, CKMB, CKMBINDEX, TROPONINI in the last  168 hours.  HbA1C: Hgb A1c MFr Bld  Date/Time Value Ref Range Status  11/11/2020 12:17 AM 8.0 (H) 4.8 - 5.6 % Final    Comment:    (NOTE) Pre diabetes:          5.7%-6.4%  Diabetes:              >6.4%  Glycemic control for   <7.0% adults with diabetes   09/01/2018 11:45 AM 9.1 (H) 4.8 - 5.6 % Final    Comment:    (NOTE) Pre diabetes:          5.7%-6.4% Diabetes:              >6.4% Glycemic control for   <7.0% adults with diabetes     CBG: Recent Labs  Lab 11/13/20 2104 11/14/20 0612 11/14/20 1118 11/14/20 1602 11/14/20 2109  GLUCAP 121* 95 80 107* 74    Review of Systems:   Unable to obtain due to obtundation, non-verbal status at this time.   Past Medical History  She,  has a past medical history of Acute CVA (cerebrovascular accident) ([place]) (11/08/2017), Acute respiratory failure with hypercapnia ([place]), Acute respiratory failure with hypoxia ([place]) (01/29/2019), Altered mental status (07/14/2017), Anemia, Arthritis, Asthma, Benign neoplasm of ascending colon, Benign neoplasm of cecum, Benign neoplasm of sigmoid colon, BPPV (benign paroxysmal positional vertigo) (01/22/2014), CAD (coronary artery disease), CVA (cerebral vascular accident) ([place]) (11/09/2017), Diabetes mellitus without complication ([place]), Disorientation, ESRD (end stage renal disease) ([place]), Gastritis and gastroduodenitis, Hematochezia, History of echocardiogram, transfusion, Hyperlipidemia, Hypernatremia, Hypertension, Hypertensive urgency (01/21/2014), Meningioma ([place]), Noncompliance with medication regimen, Obesity, Pleural effusion, left, Pulmonary edema (01/29/2019), Pulmonary hypertension due to sleep-disordered breathing ([place]) (11/14/2020), Pulmonary vascular congestion, Rectal bleeding (09/01/2018), Renal insufficiency, Syncope and collapse (44/03/1855), Systolic and diastolic CHF, acute ([place]), Systolic CHF ([place]), and Tobacco abuse (01/21/2014).   Surgical History    Past Surgical History:  Procedure  Laterality Date  . ABDOMINAL HYSTERECTOMY    . AV FISTULA PLACEMENT Left 02/10/2017   Procedure: ARTERIOVENOUS (AV) FISTULA CREATION-LEFT FOREARM;  Surgeon: Elam Dutch, MD;  Location: [facility];  Service: Vascular;  Laterality: Left;  . BIOPSY  09/04/2018   Procedure: BIOPSY;  Surgeon: Jerene Bears, MD;  Location: [facility] ENDOSCOPY;  Service: Gastroenterology;;  . BREAST SURGERY Right    Lumpectomy  . COLONOSCOPY    . COLONOSCOPY WITH PROPOFOL N/A 09/04/2018   Procedure: COLONOSCOPY WITH PROPOFOL;  Surgeon: Jerene Bears, MD;  Location: [facility];  Service: Gastroenterology;  Laterality: N/A;  . ESOPHAGOGASTRODUODENOSCOPY (EGD) WITH PROPOFOL N/A 09/04/2018   Procedure: ESOPHAGOGASTRODUODENOSCOPY (EGD) WITH PROPOFOL;  Surgeon: Jerene Bears, MD;  Location: [facility] ENDOSCOPY;  Service: Gastroenterology;  Laterality: N/A;  . FISTULA SUPERFICIALIZATION Left 08/09/2017   Procedure: FISTULA SUPERFICIALIZATION LEFT ARM ARTERIOVENOUS FISTULA;  Surgeon: Conrad Guayama, MD;  Location: [facility];  Service: Vascular;  Laterality: Left;  . INSERTION OF DIALYSIS CATHETER Right 02/10/2017   Procedure: INSERTION OF DIALYSIS CATHETER;  Surgeon: Elam Dutch, MD;  Location: [facility];  Service: Vascular;  Laterality: Right;  . LEFT HEART CATHETERIZATION WITH CORONARY ANGIOGRAM N/A 01/27/2014   Procedure: LEFT HEART CATHETERIZATION WITH CORONARY ANGIOGRAM;  Surgeon: Jettie Booze, MD;  Location: [facility] CATH LAB;  Service: Cardiovascular;  Laterality: N/A;  . POLYPECTOMY  09/04/2018   Procedure: POLYPECTOMY;  Surgeon: Jerene Bears, MD;  Location: [facility];  Service: Gastroenterology;;  .  TONSILLECTOMY       Social History   reports that she has quit smoking. Her smoking use included cigarettes. She has a 7.50 pack-year smoking history. She has never used smokeless tobacco. She reports that she does not drink alcohol and does not use drugs.   Family History   Her family history includes Diabetic kidney disease in her  mother; Heart attack in her mother; Heart failure in her mother; Hyperlipidemia in her mother; Hypertension in her father and mother.   Allergies Allergies  Allergen Reactions  . Baclofen Other (See Comments)    Severe delirium when given to pateint in Dec 2018.   . Chlorhexidine Rash     Home Medications  Prior to Admission medications   Medication Sig Start Date End Date Taking? Authorizing Provider  acetaminophen (TYLENOL) 325 MG tablet Take 2 tablets (650 mg total) by mouth every 4 (four) hours as needed for mild pain (or temp > 37.5 C (99.5 F)). Patient taking differently: Take 650 mg by mouth 2 (two) times daily. 11/11/17  Yes [name], MD  aspirin EC 81 MG tablet Take 81 mg by mouth daily. Swallow whole.   Yes [provider]  atorvastatin (LIPITOR) 40 MG tablet Take 1 tablet (40 mg total) by mouth daily at 6 PM. 07/19/17  Yes Aline August, MD  calcium acetate (PHOSLO) 667 MG capsule Take 1 capsule (667 mg total) by mouth as needed (with snacks). Patient taking differently: Take 667-2,001 mg by mouth See admin instructions. Take 3 capsules (2001 mg totally) by mouth 3 times daily with a meal; Take 1 tablet (667 mg totally) by mouth 2 times daily with snacks 11/11/17  Yes [name], [name], MD  carvedilol (COREG) 25 MG tablet Take 25 mg by mouth 2 (two) times daily. 03/28/17  Yes [provider]  hydrALAZINE (APRESOLINE) 25 MG tablet Take 1 tablet (25 mg total) by mouth every 8 (eight) hours as needed (SBP > 170  or DBP > 110). Patient taking differently: Take 50 mg by mouth daily. 02/20/17  Yes Mikhail, Velta Addison, DO  Insulin Glargine (BASAGLAR KWIKPEN) 100 UNIT/ML Inject 30 Units into the skin at bedtime. 10/07/20  Yes [provider]  mirtazapine (REMERON) 7.5 MG tablet Take 7.5 mg by mouth at bedtime. 10/07/20  Yes [provider]  Multiple Vitamins-Minerals (RENAPLEX) TABS Take 1 tablet by mouth daily. 07/10/18  Yes [provider]  pantoprazole  (PROTONIX) 40 MG tablet Take 1 tablet (40 mg total) by mouth 2 (two) times daily before a meal. Take 1 tablet twice daily for a  Month then once dailly Patient taking differently: Take 40 mg by mouth daily. 09/04/18  Yes [name], Vella Redhead, MD  polyethylene glycol (MIRALAX / GLYCOLAX) packet Take 17 g by mouth daily. Patient taking differently: Take 17 g by mouth daily as needed for mild constipation. 02/20/17  Yes Mikhail, Velta Addison, DO  triamcinolone cream (KENALOG) 0.5 % Apply 1 application topically 2 (two) times daily as needed for rash. 10/07/20  Yes [provider]  brimonidine-timolol (COMBIGAN) 0.2-0.5 % ophthalmic solution Place 1 drop into both eyes every 12 (twelve) hours.    [provider]     Critical care time: 55 minutes    ___________________ Samuella Cota. Reinaldo Berber, MD [facility]

## 2020-11-14 NOTE — Progress Notes (Signed)
[facility] Progress Note   Subjective:  Seen in HD, bp dropped into 70's and pt lightheaded, unable to reach UF goal.   Objective Vitals:   11/14/20 0900 11/14/20 0930 11/14/20 0950 11/14/20 1015  BP: (!) 98/42 (!) 86/47 (!) 76/34 (!) 100/48  Pulse: 84 69 65 69  Resp: 16 16 16 16   Temp:      TempSrc:      SpO2:    99%  Weight:    84.3 kg  Height:       Physical Exam General: Elderly woman, NAD Heart: RRR; no murmur Lungs: CTA in upper lobes, dull bases Abdomen: soft, non-tender Extremities: 1+ pitting BLE; RLE now bandaged as well Dialysis Access: LUE AVF + bruit  Additional Objective Labs: Basic Metabolic Panel: Recent Labs  Lab 11/11/20 0017 11/11/20 1037 11/12/20 0431 11/13/20 0103  NA 139 136 133* 137  K 6.2* 4.3 4.7 4.2  CL 100 99 96* 98  CO2 27 32 31 26  GLUCOSE 93 82 160* 94  BUN 57* 17 27* 11  CREATININE 9.05* 4.58* 5.85* 3.69*  CALCIUM 8.7* 7.6* 8.2* 8.2*  PHOS 8.3*  --   --   --    Liver Function Tests: Recent Labs  Lab 11/15/2020 1110 11/11/20 0017  AST 23  --   ALT 19  --   ALKPHOS 66  --   BILITOT 0.4  --   PROT 6.8  --   ALBUMIN 3.5 3.0*   Recent Labs  Lab 11/06/2020 1110  LIPASE 33   CBC: Recent Labs  Lab 11/16/2020 1110 11/13/2020 1210 11/11/20 0018 11/12/20 0431  WBC 5.1  --  4.8 5.1  NEUTROABS 3.3  --   --   --   HGB 12.6 14.6 11.5* 10.9*  HCT 43.3 43.0 39.9 37.7  MCV 106.4*  --  107.5* 108.6*  PLT 193  --  154 152   CBG: Recent Labs  Lab 11/13/20 0559 11/13/20 1246 11/13/20 1634 11/13/20 2104 11/14/20 0612  GLUCAP 90 97 122* 121* 95   Studies/Results: ECHOCARDIOGRAM COMPLETE  Result Date: 11/13/2020    ECHOCARDIOGRAM REPORT   Patient Name:   Kathleen [name] [facility] Date of Exam: 11/13/2020 Medical Rec #:  [id number]        Height:       63.0 in Accession #:    [id number]       Weight:       186.3 lb Date of Birth:  [birth year]         BSA:          1.876 m Patient Age:    75 years         BP:           162/72 mmHg  Patient Gender: F                HR:           88 bpm. Exam Location:  Inpatient Procedure: 2D Echo, Cardiac Doppler, Color Doppler and 3D Echo Indications:    Congestive Heart Failure I50.9  History:        Patient has prior history of Echocardiogram examinations, most                 recent 11/09/2017. CHF, CAD; Risk Factors:Hypertension, Diabetes                 and Dyslipidemia. End stage renal disease, anemia.  Sonographer:    Darlina Sicilian RDCS Referring  Phys: [id number] TODD D MCDIARMID  Sonographer Comments: Global longitudinal strain was attempted. IMPRESSIONS  1. Left ventricular ejection fraction, by estimation, is 60 to 65%. The left ventricle has normal function. The left ventricle has no regional wall motion abnormalities. There is moderate left ventricular hypertrophy. Left ventricular diastolic parameters are indeterminate.  2. Right ventricular systolic function is normal. The right ventricular size is mildly enlarged. There is severely elevated pulmonary artery systolic pressure. The estimated right ventricular systolic pressure is 16.1 mmHg.  3. The mitral valve is degenerative. Trivial mitral valve regurgitation. Severe mitral annular calcification. Moderate to severe mitral stenosis. MG 66mmHg at HR 79bpm, MVA 0.9 cm^2 by continuity equation  4. Tricuspid valve regurgitation is moderate.  5. The aortic valve is tricuspid. Aortic valve regurgitation is trivial. Mild aortic valve sclerosis is present, with no evidence of aortic valve stenosis.  6. The inferior vena cava is dilated in size with <50% respiratory variability, suggesting right atrial pressure of 15 mmHg.  7. Mobile calcified echodensity on mitral valve, suspect mobile mitral annular calcification but would recommend checking blood cultures to evaluate for bacteremia FINDINGS  Left Ventricle: Left ventricular ejection fraction, by estimation, is 60 to 65%. The left ventricle has normal function. The left ventricle has no regional wall motion  abnormalities. The left ventricular internal cavity size was normal in size. There is  moderate left ventricular hypertrophy. Left ventricular diastolic parameters are indeterminate. Right Ventricle: The right ventricular size is mildly enlarged. No increase in right ventricular wall thickness. Right ventricular systolic function is normal. There is severely elevated pulmonary artery systolic pressure. The tricuspid regurgitant velocity is 4.02 m/s, and with an assumed right atrial pressure of 15 mmHg, the estimated right ventricular systolic pressure is 09.6 mmHg. Left Atrium: Left atrial size was normal in size. Right Atrium: Right atrial size was normal in size. Pericardium: There is no evidence of pericardial effusion. Mitral Valve: The mitral valve is degenerative in appearance. Severe mitral annular calcification. Trivial mitral valve regurgitation. Moderate to severe mitral valve stenosis. MV peak gradient, 20.4 mmHg. The mean mitral valve gradient is 7.7 mmHg. Tricuspid Valve: The tricuspid valve is normal in structure. Tricuspid valve regurgitation is moderate. Aortic Valve: The aortic valve is tricuspid. Aortic valve regurgitation is trivial. Mild aortic valve sclerosis is present, with no evidence of aortic valve stenosis. Pulmonic Valve: The pulmonic valve was not well visualized. Pulmonic valve regurgitation is not visualized. Aorta: The aortic root and ascending aorta are structurally normal, with no evidence of dilitation. Venous: The inferior vena cava is dilated in size with less than 50% respiratory variability, suggesting right atrial pressure of 15 mmHg. IAS/Shunts: The interatrial septum was not well visualized.  LEFT VENTRICLE PLAX 2D LVIDd:         3.90 cm LVIDs:         2.60 cm LV PW:         1.30 cm LV IVS:        1.30 cm LVOT diam:     1.60 cm LV SV:         53 LV SV Index:   28 LVOT Area:     2.01 cm  RIGHT VENTRICLE RV S prime:     12.90 cm/s TAPSE (M-mode): 2.0 cm LEFT ATRIUM              Index       RIGHT ATRIUM           Index LA diam:  3.50 cm 1.87 cm/m  RA Area:     17.90 cm LA Vol (A2C):   40.7 ml 21.69 ml/m RA Volume:   48.00 ml  25.58 ml/m LA Vol (A4C):   39.0 ml 20.79 ml/m LA Biplane Vol: 41.5 ml 22.12 ml/m  AORTIC VALVE LVOT Vmax:   132.00 cm/s LVOT Vmean:  89.700 cm/s LVOT VTI:    0.262 m  AORTA Ao Root diam: 2.70 cm MITRAL VALVE                TRICUSPID VALVE MV Area (PHT): 2.26 cm     TR Peak grad:   64.6 mmHg MV Area VTI:   1.03 cm     TR Vmax:        402.00 cm/s MV Peak grad:  20.4 mmHg MV Mean grad:  7.7 mmHg     SHUNTS MV Vmax:       2.26 m/s     Systemic VTI:  0.26 m MV Vmean:      129.7 cm/s   Systemic Diam: 1.60 cm MV Decel Time: 336 msec MV E velocity: 139.00 cm/s MV A velocity: 196.00 cm/s MV E/A ratio:  0.71 Oswaldo Milian MD Electronically signed by Oswaldo Milian MD Signature Date/Time: 11/13/2020/2:39:00 PM    Final    Medications:  . (feeding supplement) PROSource Plus  30 mL Oral BID BM  . amLODipine  5 mg Oral QHS  . aspirin EC  81 mg Oral Daily  . atorvastatin  40 mg Oral q1800  . calcium acetate  2,001 mg Oral TID with meals  . heparin  5,000 Units Subcutaneous Q8H  . insulin aspart  0-6 Units Subcutaneous TID WC  . midodrine  10 mg Oral Q T,Th,Sa-HD  . mirtazapine  7.5 mg Oral QHS  . multivitamin  1 tablet Oral QHS  . pantoprazole  40 mg Oral Daily    OP HD: TTS [place] (last HD 4/2, prior to that last was 3/24)  4h  350/500  82kg  2/2 bath  P4  AVF  Hep none - Hectoral 66mcg IV q HD  Assessment/Plan: 1.  Weakness/ debility: Likely multifactorial in setting of mild overload/hyperkalemia, missed OP HD/ uremia and ongoing leg wounds. Sig deconditioning as well. Plan is for SNF placement due to debility.  2.  Hyperkalemia: resolved 3.  ESRD: w/ missed OP HD. Usual TTS schedule here. HD today, completed. 4.  HTN - BP's soft on HD, if need BP lowering meds try to give in the evening only.  5.  Volume overload: was 6kg on admit,  now 2kg up . Not tolerating UF today. CXR clear on admit. May need edw ^'d , will try to get hoyer wt 1st before raising edw.   6.  Anemia: Hgb 11.5 - no ESA need at this time. 7.  Metabolic bone disease: Ca ok, Phos high - Phoslo resumed, follow labs. 8.  T2DM: Hypoglycemia initially, now eating better. A1c 8%. Per primary. 9.  PAD/leg wounds: ischemic/stasis dermatitis per WOC notes. 10.  CAD/HFrEF   [name], MD 11/14/2020, 10:42 AM

## 2020-11-14 NOTE — Progress Notes (Signed)
In reviewing patient echocardiogram, she is noted to have density on mitral valve with recommendations to check blood cultures.  Patient afebrile and without leukocytosis on admission for chief complaint of weakness, blood cultures were not obtained at that time.  Patient has remained afebrile during admission.    Blood cultures collected for today. We will plan for cardiology consultation on 4/10

## 2020-11-14 NOTE — Progress Notes (Signed)
12 Lead EKG performed on patient; MD Gladys Damme at bedside to review result. RN notified MD of persistent vomiting, persistent lethargy (patient only responsive to painful stimuli), and POC BG 74. RN will remain at patient bedside and await further orders.

## 2020-11-14 NOTE — Significant Event (Signed)
Rapid Response Event Note   Reason for Call : Hypercarbic respiratory failure Initial Focused Assessment:  Nursing staff notified me of pt with acute desaturations to 60%. Pt noncompliant with BIPAP and has vomited a few times. Pt is obtunded and arousable to only noxious stimuli.  Pt will be transferred to ICU for probable intubation. ABG 7.08/104/50.6/29.8  2300- afebrile, HR 77 SR, 119/45 (68), RR 15 with sats 98% on 5LNC  Interventions:  -Tx to ICU    MD Notified: Dr. Chauncey Reading at bedside, PCCM consulted Call Time: [place] Time: 2245 End Time: 0001  Madelynn Done, RN

## 2020-11-14 NOTE — Progress Notes (Addendum)
Son, Jaicee Michelotti, notified of patient's status and transfer to ICU. Will place referral to palliative care with son's consent.  Gladys Damme, MD [place] [facility], PGY-2

## 2020-11-14 NOTE — Progress Notes (Signed)
Family Medicine Teaching Service Daily Progress Note Intern Pager: [phone]  Patient name: Kathleen [name] Medical record number: [id number] Date of birth: 1945-10-03 Age: 75 y.o. Gender: female  Primary Care Provider: Benito Mccreedy, MD Consultants: Nephrology Code Status: Full code  Pt Overview and Major Events to Date:  4/5: Patient admitted for weakness  4/7: Patient to discharge to SNF  Assessment and Plan: Kathleen [name] isa 75 year old female who presented with weakness and hyperglycemia after having missed 1 week of dialysis.  PMH significant for ESRD on HD T/TH/SA, CHF, acute CVA, T2DM, CAD, BPPV, HLD, pulmonary vascular congestion, tobacco use.   Respiratory acidosis 2/2 hypercapnic respiratory failure  Weakness  Patient declined on multiple occasions to use BiPAP to help with oxygenation.  Patient unable to wean to room air overnight. Currently on 3 liters supplemental O2.  Echo significant for elevated pulmonary artery pressures with RV measurement of 79 which is likely contributing to respiratory status.  Overall weakness improved. PT/OT recommending SNF at discharge.  -Monitor respiratory status, oxygen saturation goal greater than 88% - consult cardiology or pulmonology for elevated pulm artery pressures   ESRDon HDT/Th/Sa Patient is receiving HD per outpatient schedule.  -Nephrology following, appreciate recommendations and care for this patient - RFP daily   Type 2 diabetes Glucose range from 90-120s overnight.  Patient received 0 units of sliding scale insulin. A1c on admission 8. Home insulin prescribed 30 units, patient recently less than prescribed due to decreased PO intake. BG well within normal range during this hospitalization.  Patient has increased oral intake since admission. -discontinue very sensitive sliding scale  -AM CBG checks daily  -Hypoglycemia protocol  Decreased PO intake, improved  Continue meal supplements.  Continue to encourage  increased oral intake   Lower extremity wounds, stable Patient follows with outpatient wound care, lower extremity hematoma stasis changes with blistering.  No signs of infection at this point. -Wound care consult, appreciate recommendations and care for the patient  Hypertension BP pressure overnight ranged from 104-168/ 45-73. Coreg and hydralazine held due to lower Bps. Expect patient may have lower pressures today after having HD this morning. Home medications include     Heart failure, preserve EF  Echo showed 60 to 65%, moderate left ventricular hypertrophy, there is severely elevated pulmonary artery  systolic pressure w/ RV systolic pressure of 44.9 mmHg; degenerative MV. Trivial MV regurgitation.  Severe mitral annular calcification. Moderate to severe mitral stenosis, moderate tricuspid regurgitation, AV without stenosis, dilated IVC with RA pressure of 15 and mobile density on MV; recommended checking for bacteremia.  - will consider cardiology vs pulmonary consult for evaluation    CAD Continue home aspirin.  Hyperlipidemia Continue home atorvastatin 40 mg daily.  Insomnia Continue home mirtazapine 7.5 mg nightly.  GERD Continue home pantoprazole 40 mg daily.    FEN/GI: renal diet, carb modified   PPx: Heparin   Status is: Inpatient  Remains inpatient appropriate because:awaiting placement at SNF   - receive Moderna vaccination 4/8   Dispo: The patient is from: Home              Anticipated d/c is to: SNF              Patient currently is medically stable to d/c.   Difficult to place patient No  Subjective:  Patient sleeping in HD unit. Wakes and responded to questions. Patient denies SOB, states that her breathing is "ok". She denies presence of pain.   Objective: Temp:  [98.1 F (  36.7 C)-99.3 F (37.4 C)] 99.1 F (37.3 C) (04/09 0707) Pulse Rate:  [65-90] 88 (04/09 1100) Resp:  [14-22] 20 (04/09 1100) BP: (71-168)/(34-73) 134/62 (04/09  1100) SpO2:  [97 %-100 %] 99 % (04/09 1100) FiO2 (%):  [28 %] 28 % (04/08 2228) Weight:  [84.2 kg-86.5 kg] 85.2 kg (04/09 1200)  Physical Exam: General: elderly female, sleeping, sitting upright in dialysis unit  Cardiovascular: systolic murmur Respiratory: on 3L via [place], no signs of respiratory distress  Abdomen: soft, NT, BS present throughout  Extremities: trace edema, bilateral LE wrapped for wounds, no visualization of worsened erythema or warmth to touch, moves extremities spontaneously   Laboratory: Recent Labs  Lab 11/30/2020 1110 11/19/2020 1210 11/11/20 0018 11/12/20 0431  WBC 5.1  --  4.8 5.1  HGB 12.6 14.6 11.5* 10.9*  HCT 43.3 43.0 39.9 37.7  PLT 193  --  154 152   Recent Labs  Lab 11/09/2020 1110 12/01/2020 1210 11/11/20 1037 11/12/20 0431 11/13/20 0103  NA 139   < > 136 133* 137  K 5.8*   < > 4.3 4.7 4.2  CL 98   < > 99 96* 98  CO2 31   < > 32 31 26  BUN 55*   < > 17 27* 11  CREATININE 8.83*   < > 4.58* 5.85* 3.69*  CALCIUM 9.3   < > 7.6* 8.2* 8.2*  PROT 6.8  --   --   --   --   BILITOT 0.4  --   --   --   --   ALKPHOS 66  --   --   --   --   ALT 19  --   --   --   --   AST 23  --   --   --   --   GLUCOSE 61*   < > 82 160* 94   < > = values in this interval not displayed.     Imaging/Diagnostic Tests: No results found.   Eulis Foster, MD 11/14/2020, 1:13 PM PGY-2, [place] Intern pager: [phone], text pages welcome

## 2020-11-15 ENCOUNTER — Inpatient Hospital Stay (HOSPITAL_COMMUNITY): Payer: Medicare HMO

## 2020-11-15 DIAGNOSIS — I517 Cardiomegaly: Secondary | ICD-10-CM | POA: Diagnosis not present

## 2020-11-15 DIAGNOSIS — J9601 Acute respiratory failure with hypoxia: Secondary | ICD-10-CM | POA: Diagnosis not present

## 2020-11-15 DIAGNOSIS — J9602 Acute respiratory failure with hypercapnia: Secondary | ICD-10-CM

## 2020-11-15 DIAGNOSIS — Z9911 Dependence on respirator [ventilator] status: Secondary | ICD-10-CM | POA: Diagnosis not present

## 2020-11-15 DIAGNOSIS — J90 Pleural effusion, not elsewhere classified: Secondary | ICD-10-CM | POA: Diagnosis not present

## 2020-11-15 DIAGNOSIS — Z4682 Encounter for fitting and adjustment of non-vascular catheter: Secondary | ICD-10-CM | POA: Diagnosis not present

## 2020-11-15 DIAGNOSIS — E162 Hypoglycemia, unspecified: Secondary | ICD-10-CM | POA: Diagnosis not present

## 2020-11-15 DIAGNOSIS — N186 End stage renal disease: Secondary | ICD-10-CM | POA: Diagnosis not present

## 2020-11-15 LAB — POCT I-STAT 7, (LYTES, BLD GAS, ICA,H+H)
Acid-Base Excess: 3 mmol/L — ABNORMAL HIGH (ref 0.0–2.0)
Bicarbonate: 28.6 mmol/L — ABNORMAL HIGH (ref 20.0–28.0)
Calcium, Ion: 1.26 mmol/L (ref 1.15–1.40)
HCT: 37 % (ref 36.0–46.0)
Hemoglobin: 12.6 g/dL (ref 12.0–15.0)
O2 Saturation: 100 %
Patient temperature: 99.5
Potassium: 3.8 mmol/L (ref 3.5–5.1)
Sodium: 134 mmol/L — ABNORMAL LOW (ref 135–145)
TCO2: 30 mmol/L (ref 22–32)
pCO2 arterial: 45.8 mmHg (ref 32.0–48.0)
pH, Arterial: 7.405 (ref 7.350–7.450)
pO2, Arterial: 367 mmHg — ABNORMAL HIGH (ref 83.0–108.0)

## 2020-11-15 LAB — GLUCOSE, CAPILLARY
Glucose-Capillary: 105 mg/dL — ABNORMAL HIGH (ref 70–99)
Glucose-Capillary: 166 mg/dL — ABNORMAL HIGH (ref 70–99)
Glucose-Capillary: 239 mg/dL — ABNORMAL HIGH (ref 70–99)
Glucose-Capillary: 262 mg/dL — ABNORMAL HIGH (ref 70–99)
Glucose-Capillary: 64 mg/dL — ABNORMAL LOW (ref 70–99)
Glucose-Capillary: 75 mg/dL (ref 70–99)
Glucose-Capillary: 78 mg/dL (ref 70–99)
Glucose-Capillary: 80 mg/dL (ref 70–99)
Glucose-Capillary: 85 mg/dL (ref 70–99)
Glucose-Capillary: 89 mg/dL (ref 70–99)
Glucose-Capillary: 92 mg/dL (ref 70–99)

## 2020-11-15 LAB — MAGNESIUM: Magnesium: 2.2 mg/dL (ref 1.7–2.4)

## 2020-11-15 LAB — BASIC METABOLIC PANEL
Anion gap: 13 (ref 5–15)
BUN: 15 mg/dL (ref 8–23)
CO2: 26 mmol/L (ref 22–32)
Calcium: 9.8 mg/dL (ref 8.9–10.3)
Chloride: 96 mmol/L — ABNORMAL LOW (ref 98–111)
Creatinine, Ser: 4.05 mg/dL — ABNORMAL HIGH (ref 0.44–1.00)
GFR, Estimated: 11 mL/min — ABNORMAL LOW (ref >60)
Glucose, Bld: 84 mg/dL (ref 70–99)
Potassium: 4.4 mmol/L (ref 3.5–5.1)
Sodium: 135 mmol/L (ref 135–145)

## 2020-11-15 LAB — TRIGLYCERIDES: Triglycerides: 185 mg/dL — ABNORMAL HIGH (ref <150)

## 2020-11-15 LAB — CBC
HCT: 46.2 % — ABNORMAL HIGH (ref 36.0–46.0)
Hemoglobin: 12.8 g/dL (ref 12.0–15.0)
MCH: 30.8 pg (ref 26.0–34.0)
MCHC: 27.7 g/dL — ABNORMAL LOW (ref 30.0–36.0)
MCV: 111.1 fL — ABNORMAL HIGH (ref 80.0–100.0)
Platelets: 124 10*3/uL — ABNORMAL LOW (ref 150–400)
RBC: 4.16 MIL/uL (ref 3.87–5.11)
RDW: 13.9 % (ref 11.5–15.5)
WBC: 5.5 10*3/uL (ref 4.0–10.5)
nRBC: 0 % (ref 0.0–0.2)

## 2020-11-15 LAB — TROPONIN I (HIGH SENSITIVITY): Troponin I (High Sensitivity): 46 ng/L — ABNORMAL HIGH (ref <18)

## 2020-11-15 LAB — PHOSPHORUS: Phosphorus: 5.1 mg/dL — ABNORMAL HIGH (ref 2.5–4.6)

## 2020-11-15 LAB — LACTIC ACID, PLASMA: Lactic Acid, Venous: 1 mmol/L (ref 0.5–1.9)

## 2020-11-15 LAB — MRSA PCR SCREENING: MRSA by PCR: NEGATIVE

## 2020-11-15 MED ORDER — DEXTROSE 10 % IV SOLN: INTRAVENOUS | Status: DC

## 2020-11-15 MED ORDER — MIRTAZAPINE 15 MG PO TABS
7.5000 mg | ORAL_TABLET | Freq: Every day | ORAL | Status: DC
  Administered 2020-11-15 – 2020-11-19 (×5): 7.5 mg
  Filled 2020-11-15 (×5): qty 1

## 2020-11-15 MED ORDER — RENA-VITE PO TABS
1.0000 | ORAL_TABLET | Freq: Every day | ORAL | Status: DC
  Administered 2020-11-15 – 2020-11-24 (×7): 1
  Filled 2020-11-15 (×7): qty 1

## 2020-11-15 MED ORDER — VITAL HIGH PROTEIN PO LIQD
1000.0000 mL | ORAL | Status: DC
  Administered 2020-11-15: 1000 mL
  Filled 2020-11-15: qty 1000

## 2020-11-15 MED ORDER — ORAL CARE MOUTH RINSE
15.0000 mL | OROMUCOSAL | Status: DC
  Administered 2020-11-15 – 2020-11-20 (×51): 15 mL via OROMUCOSAL

## 2020-11-15 MED ORDER — CLEVIDIPINE BUTYRATE 0.5 MG/ML IV EMUL
0.0000 mg/h | INTRAVENOUS | Status: DC
  Administered 2020-11-15 (×2): 1 mg/h via INTRAVENOUS
  Administered 2020-11-16: 2 mg/h via INTRAVENOUS
  Filled 2020-11-15 (×3): qty 50

## 2020-11-15 MED ORDER — INSULIN ASPART 100 UNIT/ML ~~LOC~~ SOLN
0.0000 [IU] | Freq: Once | SUBCUTANEOUS | Status: AC
  Administered 2020-11-15: 1 [IU] via SUBCUTANEOUS

## 2020-11-15 MED ORDER — MIDODRINE HCL 5 MG PO TABS
10.0000 mg | ORAL_TABLET | ORAL | Status: DC
  Administered 2020-11-19: 10 mg
  Filled 2020-11-15 (×2): qty 2

## 2020-11-15 MED ORDER — ATORVASTATIN CALCIUM 40 MG PO TABS
40.0000 mg | ORAL_TABLET | Freq: Every day | ORAL | Status: DC
  Administered 2020-11-15 – 2020-11-25 (×8): 40 mg
  Filled 2020-11-15 (×8): qty 1

## 2020-11-15 MED ORDER — ASPIRIN 81 MG PO CHEW
81.0000 mg | CHEWABLE_TABLET | Freq: Every day | ORAL | Status: DC
  Administered 2020-11-15 – 2020-11-25 (×9): 81 mg
  Filled 2020-11-15 (×9): qty 1

## 2020-11-15 MED ORDER — PROSOURCE TF PO LIQD
45.0000 mL | Freq: Two times a day (BID) | ORAL | Status: DC
  Administered 2020-11-15 – 2020-11-16 (×3): 45 mL
  Filled 2020-11-15 (×3): qty 45

## 2020-11-15 MED ORDER — INSULIN ASPART 100 UNIT/ML ~~LOC~~ SOLN: 0.0000 [IU] | Freq: Three times a day (TID) | SUBCUTANEOUS | Status: DC

## 2020-11-15 MED ORDER — ACETAMINOPHEN 325 MG PO TABS
650.0000 mg | ORAL_TABLET | Freq: Four times a day (QID) | ORAL | Status: DC | PRN
Start: 2020-11-15 — End: 2020-11-26
  Administered 2020-11-18 – 2020-11-25 (×5): 650 mg
  Filled 2020-11-15 (×5): qty 2

## 2020-11-15 MED ORDER — ACETAMINOPHEN 650 MG RE SUPP: 650.0000 mg | Freq: Four times a day (QID) | RECTAL | Status: DC | PRN

## 2020-11-15 MED ORDER — PANTOPRAZOLE SODIUM 40 MG PO PACK
40.0000 mg | PACK | Freq: Every day | ORAL | Status: DC
  Administered 2020-11-15 – 2020-11-25 (×9): 40 mg
  Filled 2020-11-15 (×11): qty 20

## 2020-11-15 MED ORDER — INSULIN ASPART 100 UNIT/ML ~~LOC~~ SOLN
0.0000 [IU] | SUBCUTANEOUS | Status: DC
  Administered 2020-11-15: 2 [IU] via SUBCUTANEOUS
  Administered 2020-11-15: 3 [IU] via SUBCUTANEOUS
  Administered 2020-11-16: 2 [IU] via SUBCUTANEOUS
  Administered 2020-11-16: 3 [IU] via SUBCUTANEOUS

## 2020-11-15 MED ORDER — DEXTROSE 50 % IV SOLN
INTRAVENOUS | Status: AC
  Administered 2020-11-15: 25 mL
  Filled 2020-11-15: qty 50

## 2020-11-15 MED ORDER — AMLODIPINE BESYLATE 5 MG PO TABS
5.0000 mg | ORAL_TABLET | Freq: Every day | ORAL | Status: DC
  Administered 2020-11-15: 5 mg
  Filled 2020-11-15: qty 1

## 2020-11-15 MED ORDER — HYDRALAZINE HCL 20 MG/ML IJ SOLN
10.0000 mg | INTRAMUSCULAR | Status: DC | PRN
  Filled 2020-11-15: qty 1

## 2020-11-15 NOTE — Progress Notes (Signed)
NAME:  Kathleen [name], MRN:  [id number], DOB:  1945-08-24, LOS: 4 ADMISSION DATE:  11/16/2020, CONSULTATION DATE:  4/9 REFERRING MD:  Dr. McDiarmid, CHIEF COMPLAINT:  Pulmonary hypertension  History of Present Illness:  75 year old female with PMH as below, which is significant for ESRD, HFpEF, CVA, DM, CAD and tobacco abuse. She presented to [facility] ED 4/5 with complaints of progressive weakness x 2 week and has missed HD for the entire week prior to presentation. No other specific symptoms at the time of admission. In the emergency department she was found to have hypercarbic respiratory failure and was started on BiPAP. It was felt that with HD she would improve. She largely did improve with HD, despite difficulty using NIMV nocturnally.  Echocardiogram was done as part of the workup and showed markedly elevated pulmonary artery systolic pressure of 37-85%. PCCM was consulted for new diagnosis of pulmonary hypertension.   Later that evening she had vomiting episodes and developed somnolence. She was found to be hypercapneic and was moved to the ICU for intubation and mechanical ventilation.  Pertinent  Medical History   has a past medical history of Acute CVA (cerebrovascular accident) ([place]) (11/08/2017), Acute respiratory failure with hypercapnia ([place]), Acute respiratory failure with hypoxia ([place]) (01/29/2019), Altered mental status (07/14/2017), Anemia, Arthritis, Asthma, Benign neoplasm of ascending colon, Benign neoplasm of cecum, Benign neoplasm of sigmoid colon, BPPV (benign paroxysmal positional vertigo) (01/22/2014), CAD (coronary artery disease), CVA (cerebral vascular accident) ([place]) (11/09/2017), Diabetes mellitus without complication ([place]), Disorientation, ESRD (end stage renal disease) ([place]), Gastritis and gastroduodenitis, Hematochezia, History of echocardiogram, transfusion, Hyperlipidemia, Hypernatremia, Hypertension, Hypertensive urgency (01/21/2014), Meningioma ([place]), Noncompliance with  medication regimen, Obesity, Pleural effusion, left, Pulmonary edema (01/29/2019), Pulmonary hypertension due to sleep-disordered breathing ([place]) (11/14/2020), Pulmonary vascular congestion, Rectal bleeding (09/01/2018), Renal insufficiency, Syncope and collapse (88/12/275), Systolic and diastolic CHF, acute ([place]), Systolic CHF ([place]), and Tobacco abuse (01/21/2014).   Significant Hospital Events: Including procedures, antibiotic start and stop dates in addition to other pertinent events   . 4/4 admit  . 4/8 Echo LVEF 60-65%. RV systolic function normal. Mod/sev mitral stenosis, mod TR.   Interim History / Subjective:  Hypertensive when her sedation is decreased, but still very sedated.  Objective   Blood pressure (!) 131/59, pulse 68, temperature 98.7 F (37.1 C), temperature source Oral, resp. rate (!) 22, height 5\' 3"  (1.6 m), weight 84.6 kg, SpO2 100 %.    Vent Mode: PRVC FiO2 (%):  [50 %-100 %] 50 % Set Rate:  [22 bmp] 22 bmp Vt Set:  [420 mL] 420 mL PEEP:  [5 cmH20] 5 cmH20 Plateau Pressure:  [21 cmH20] 21 cmH20   Intake/Output Summary (Last 24 hours) at 11/15/2020 0735 Last data filed at 11/15/2020 0600 Gross per 24 hour  Intake 243.15 ml  Output 600 ml  Net -356.85 ml   Filed Weights   11/14/20 1015 11/14/20 1200 11/15/20 0600  Weight: 84.3 kg 85.2 kg 84.6 kg    Examination: General: critically ill appearing elderly woman laying in bed in NAD HENT: [place]/AT, eyes anicteric, ETT in place Lungs: minimal rhales, no rhonchi or wheezing. No significant ETT secretions Cardiovascular: S1S2, RRR Abdomen: obese, soft, NT Extremities: shins wrapped in compressive dressings, wrinkles on feet but minimal persistent edema. No cyanosis. Neuro: RASS -5, winces during ETT suctioning.  Labs/imaging that I have personally reviewed  (right click and "Reselect all SmartList Selections" daily)  ECHO: LVEF 60-65%. RV systolic function normal. Mod/sev mitral stenosis, mod  TR.  Resolved Hospital  Problem list     Assessment & Plan:   Acute respiratory failure with hypoxia and hypercapnia requiring MV due to aspiration pneumonitis -LTVV, 4-8cc/kg IBW with goal Pplat <30 and DP<15 -VAP prevention protocol -PAD protocol for sedation; goal RASS 0 to -1 -daily SAT & SBT as able; need to better control HTN to wake her up this morning. Adding cleviprex.  Pulmonary hypertension as identified of transthoracic echocardiogram 4/8. Right ventricular systolic pressure is estimated at 79.6. She has a history concerning for group 2 disease in the setting of known hypertension, HFpEF, and valvular heart disease (mod to severe mitral stenosis). Also concern for group 3 disease considering likely undiagnosed OSA/OHS. Probable OSA, at risk for OHS Moderate to severe MS HTN -needs to have her volume status optimized and acute respiratory failure addressed before additional testing is needed. Will eventually need PSG, PFTs given history of tobacco use.  - Supplemental O2 as indicated for goal SpO2 88-94% - VQ scan could be performed when she is optimized to evaluate for CTEPH as an outpatient, but she has other risk factors that would better explain PH - Optimize volume status. HD has been limited by hypotension previously. - need to optimize her cardiac disease and HTN management. Palliative Care consult is appropriate given multi-system disease - Will be happy to arrange pulmonary follow up as she nears discharge.   Hypoglycemia; h/o DM -start TF today; can stop D10w when starting TF -SSI PRN -goal BG 140-180  Nausea/vomiting -trial of TF today  ESRD, hypervolemia from dialysis noncompliance -appreciate nephro's management -renally dose meds -strict I/Os -had midodrine for HD  History of CVA -ASA, statin  CAD -ASA, statin    Best practice (right click and "Reselect all SmartList Selections" daily)  Diet:  Tube Feed  Pain/Anxiety/Delirium protocol (if indicated): Yes (RASS goal  0) VAP protocol (if indicated): Yes DVT prophylaxis: Subcutaneous Heparin GI prophylaxis: N/A and PPI Glucose control:  SSI Yes Central venous access:  N/A Arterial line:  N/A Foley:  N/A Mobility:  bed rest  PT consulted: N/A Last date of multidisciplinary goals of care discussion [ ]  Code Status:  full code Disposition: ICU  Labs   CBC: Recent Labs  Lab 11/30/2020 1110 11/06/2020 1210 11/11/20 0018 11/12/20 0431 11/14/20 2144 11/15/20 0107 11/15/20 0211  WBC 5.1  --  4.8 5.1 6.6 5.5  --   NEUTROABS 3.3  --   --   --   --   --   --   HGB 12.6   < > 11.5* 10.9* 11.0* 12.8 12.6  HCT 43.3   < > 39.9 37.7 39.0 46.2* 37.0  MCV 106.4*  --  107.5* 108.6* 109.2* 111.1*  --   PLT 193  --  154 152 131* 124*  --    < > = values in this interval not displayed.    Basic Metabolic Panel: Recent Labs  Lab 11/20/2020 1110 11/13/2020 1210 11/11/20 0017 11/11/20 1037 11/12/20 0431 11/13/20 0103 11/14/20 2144 11/15/20 0107 11/15/20 0211  NA 139   < > 139 136 133* 137 135 135 134*  K 5.8*   < > 6.2* 4.3 4.7 4.2 3.9 4.4 3.8  CL 98  --  100 99 96* 98 97* 96*  --   CO2 31  --  27 32 31 26 27 26   --   GLUCOSE 61*  --  93 82 160* 94 75 84  --   BUN 55*  --  57* 17 27* 11 14 15   --   CREATININE 8.83*  --  9.05* 4.58* 5.85* 3.69* 4.04* 4.05*  --   CALCIUM 9.3  --  8.7* 7.6* 8.2* 8.2* 9.2 9.8  --   MG 2.7*  --   --   --   --   --   --  2.2  --   PHOS  --   --  8.3*  --   --   --   --  5.1*  --    < > = values in this interval not displayed.   GFR: Estimated Creatinine Clearance: 12.2 mL/min (A) (by C-G formula based on SCr of 4.05 mg/dL (H)). Recent Labs  Lab 11/11/20 0018 11/11/20 1522 11/12/20 0431 11/14/20 2144 11/15/20 0107 11/15/20 0108  WBC 4.8  --  5.1 6.6 5.5  --   LATICACIDVEN  --  0.9  --   --   --  1.0    This patient is critically ill with multiple organ system failure which requires frequent high complexity decision making, assessment, support, evaluation, and  titration of therapies. This was completed through the application of advanced monitoring technologies and extensive interpretation of multiple databases. During this encounter critical care time was devoted to patient care services described in this note for 35 minutes.  Julian Hy, DO 11/15/20 7:54 AM [facility]

## 2020-11-15 NOTE — Progress Notes (Signed)
[facility] Progress Note   Subjective:  Seen in ICU. Yest did not tolerate much UF due to symptomatic hypotension. Post HD has sig lethargy and significant vomiting. Intradialytic hypotension resolved post HD yest and BP's were on high side last evening. Eventually desat'd to the 60%'s and obtunded so was intubated. ABG was 7.08/ 104/ 50. Hypercarbia.   Objective Vitals:   11/15/20 0600 11/15/20 0615 11/15/20 0630 11/15/20 0742  BP:  (!) 149/76 (!) 131/59   Pulse: 68  68 66  Resp: (!) 22 (!) 22 (!) 22 (!) 22  Temp:      TempSrc:      SpO2: 100%  100% 98%  Weight: 84.6 kg     Height:       Physical Exam General: Elderly woman, NAD Neck: flat neck veins Heart: RRR; no murmur Lungs: CTA bilat ant and lateral Abdomen: soft, non-tender Extremities: 1+ bilat dependent hip edema; lower legs wrapped Dialysis Access: LUE AVF + bruit  Additional Objective Labs: Basic Metabolic Panel: Recent Labs  Lab 11/11/20 0017 11/11/20 1037 11/13/20 0103 11/14/20 2144 11/15/20 0107 11/15/20 0211  NA 139   < > 137 135 135 134*  K 6.2*   < > 4.2 3.9 4.4 3.8  CL 100   < > 98 97* 96*  --   CO2 27   < > 26 27 26   --   GLUCOSE 93   < > 94 75 84  --   BUN 57*   < > 11 14 15   --   CREATININE 9.05*   < > 3.69* 4.04* 4.05*  --   CALCIUM 8.7*   < > 8.2* 9.2 9.8  --   PHOS 8.3*  --   --   --  5.1*  --    < > = values in this interval not displayed.   Liver Function Tests: Recent Labs  Lab 11/19/2020 1110 11/11/20 0017  AST 23  --   ALT 19  --   ALKPHOS 66  --   BILITOT 0.4  --   PROT 6.8  --   ALBUMIN 3.5 3.0*   Recent Labs  Lab 11/09/2020 1110  LIPASE 33   CBC: Recent Labs  Lab 12/03/2020 1110 12/03/2020 1210 11/11/20 0018 11/12/20 0431 11/14/20 2144 11/15/20 0107 11/15/20 0211  WBC 5.1  --  4.8 5.1 6.6 5.5  --   NEUTROABS 3.3  --   --   --   --   --   --   HGB 12.6   < > 11.5* 10.9* 11.0* 12.8 12.6  HCT 43.3   < > 39.9 37.7 39.0 46.2* 37.0  MCV 106.4*  --   107.5* 108.6* 109.2* 111.1*  --   PLT 193  --  154 152 131* 124*  --    < > = values in this interval not displayed.   CBG: Recent Labs  Lab 11/15/20 0404 11/15/20 0432 11/15/20 0602 11/15/20 0717 11/15/20 0805  GLUCAP 64* 85 75 92 78   Studies/Results: DG Abd 1 View  Result Date: 11/15/2020 CLINICAL DATA:  Orogastric tube placement EXAM: ABDOMEN - 1 VIEW COMPARISON:  12/22/2015 FINDINGS: Enteric tube which at least reaches the stomach, tip not visualized. Hazy appearance of the bilateral lower chest as described on dedicated chest x-ray. Cardiomegaly. No dilated bowel seen in the covered abdomen. IMPRESSION: Enteric tube with tip at least reaching the distal stomach. Electronically Signed   By: Monte Fantasia M.D.   On:  11/15/2020 05:49   DG CHEST PORT 1 VIEW  Result Date: 11/15/2020 CLINICAL DATA:  Intubated EXAM: PORTABLE CHEST 1 VIEW COMPARISON:  11/12/2020, 04/11/2020 FINDINGS: Interval intubation, tip of the endotracheal tube is about 1.5 cm superior to carina. Esophageal tube tip below the diaphragm but incompletely visualized. Cardiomegaly with vascular congestion. Probable trace pleural effusions. No focal consolidation or pneumothorax IMPRESSION: 1. Endotracheal tube tip is about 1.5 cm superior to carina. 2. Cardiomegaly with vascular congestion and probable trace pleural effusions. Electronically Signed   By: Donavan Foil M.D.   On: 11/15/2020 00:34   ECHOCARDIOGRAM COMPLETE  Result Date: 11/13/2020    ECHOCARDIOGRAM REPORT   Patient Name:   Kathleen [name] [facility] Date of Exam: 11/13/2020 Medical Rec #:  [id number]        Height:       63.0 in Accession #:    [id number]       Weight:       186.3 lb Date of Birth:  September 01, 1945         BSA:          1.876 m Patient Age:    28 years         BP:           162/72 mmHg Patient Gender: F                HR:           88 bpm. Exam Location:  Inpatient Procedure: 2D Echo, Cardiac Doppler, Color Doppler and 3D Echo Indications:    Congestive Heart  Failure I50.9  History:        Patient has prior history of Echocardiogram examinations, most                 recent 11/09/2017. CHF, CAD; Risk Factors:Hypertension, Diabetes                 and Dyslipidemia. End stage renal disease, anemia.  Sonographer:    Darlina Sicilian RDCS Referring Phys: [id number] TODD D MCDIARMID  Sonographer Comments: Global longitudinal strain was attempted. IMPRESSIONS  1. Left ventricular ejection fraction, by estimation, is 60 to 65%. The left ventricle has normal function. The left ventricle has no regional wall motion abnormalities. There is moderate left ventricular hypertrophy. Left ventricular diastolic parameters are indeterminate.  2. Right ventricular systolic function is normal. The right ventricular size is mildly enlarged. There is severely elevated pulmonary artery systolic pressure. The estimated right ventricular systolic pressure is 85.4 mmHg.  3. The mitral valve is degenerative. Trivial mitral valve regurgitation. Severe mitral annular calcification. Moderate to severe mitral stenosis. MG 72mmHg at HR 79bpm, MVA 0.9 cm^2 by continuity equation  4. Tricuspid valve regurgitation is moderate.  5. The aortic valve is tricuspid. Aortic valve regurgitation is trivial. Mild aortic valve sclerosis is present, with no evidence of aortic valve stenosis.  6. The inferior vena cava is dilated in size with <50% respiratory variability, suggesting right atrial pressure of 15 mmHg.  7. Mobile calcified echodensity on mitral valve, suspect mobile mitral annular calcification but would recommend checking blood cultures to evaluate for bacteremia FINDINGS  Left Ventricle: Left ventricular ejection fraction, by estimation, is 60 to 65%. The left ventricle has normal function. The left ventricle has no regional wall motion abnormalities. The left ventricular internal cavity size was normal in size. There is  moderate left ventricular hypertrophy. Left ventricular diastolic parameters are  indeterminate. Right Ventricle: The right ventricular size is mildly enlarged.  No increase in right ventricular wall thickness. Right ventricular systolic function is normal. There is severely elevated pulmonary artery systolic pressure. The tricuspid regurgitant velocity is 4.02 m/s, and with an assumed right atrial pressure of 15 mmHg, the estimated right ventricular systolic pressure is 56.3 mmHg. Left Atrium: Left atrial size was normal in size. Right Atrium: Right atrial size was normal in size. Pericardium: There is no evidence of pericardial effusion. Mitral Valve: The mitral valve is degenerative in appearance. Severe mitral annular calcification. Trivial mitral valve regurgitation. Moderate to severe mitral valve stenosis. MV peak gradient, 20.4 mmHg. The mean mitral valve gradient is 7.7 mmHg. Tricuspid Valve: The tricuspid valve is normal in structure. Tricuspid valve regurgitation is moderate. Aortic Valve: The aortic valve is tricuspid. Aortic valve regurgitation is trivial. Mild aortic valve sclerosis is present, with no evidence of aortic valve stenosis. Pulmonic Valve: The pulmonic valve was not well visualized. Pulmonic valve regurgitation is not visualized. Aorta: The aortic root and ascending aorta are structurally normal, with no evidence of dilitation. Venous: The inferior vena cava is dilated in size with less than 50% respiratory variability, suggesting right atrial pressure of 15 mmHg. IAS/Shunts: The interatrial septum was not well visualized.  LEFT VENTRICLE PLAX 2D LVIDd:         3.90 cm LVIDs:         2.60 cm LV PW:         1.30 cm LV IVS:        1.30 cm LVOT diam:     1.60 cm LV SV:         53 LV SV Index:   28 LVOT Area:     2.01 cm  RIGHT VENTRICLE RV S prime:     12.90 cm/s TAPSE (M-mode): 2.0 cm LEFT ATRIUM             Index       RIGHT ATRIUM           Index LA diam:        3.50 cm 1.87 cm/m  RA Area:     17.90 cm LA Vol (A2C):   40.7 ml 21.69 ml/m RA Volume:   48.00 ml  25.58  ml/m LA Vol (A4C):   39.0 ml 20.79 ml/m LA Biplane Vol: 41.5 ml 22.12 ml/m  AORTIC VALVE LVOT Vmax:   132.00 cm/s LVOT Vmean:  89.700 cm/s LVOT VTI:    0.262 m  AORTA Ao Root diam: 2.70 cm MITRAL VALVE                TRICUSPID VALVE MV Area (PHT): 2.26 cm     TR Peak grad:   64.6 mmHg MV Area VTI:   1.03 cm     TR Vmax:        402.00 cm/s MV Peak grad:  20.4 mmHg MV Mean grad:  7.7 mmHg     SHUNTS MV Vmax:       2.26 m/s     Systemic VTI:  0.26 m MV Vmean:      129.7 cm/s   Systemic Diam: 1.60 cm MV Decel Time: 336 msec MV E velocity: 139.00 cm/s MV A velocity: 196.00 cm/s MV E/A ratio:  0.71 Oswaldo Milian MD Electronically signed by Oswaldo Milian MD Signature Date/Time: 11/13/2020/2:39:00 PM    Final    Medications: . sodium chloride 10 mL/hr at 11/15/20 0600  . clevidipine    . fentaNYL infusion INTRAVENOUS 200 mcg/hr (11/15/20 0600)  . norepinephrine    .  propofol (DIPRIVAN) infusion 20 mcg/kg/min (11/15/20 0600)   . (feeding supplement) PROSource Plus  30 mL Oral BID BM  . amLODipine  5 mg Per Tube QHS  . aspirin  81 mg Per Tube Daily  . atorvastatin  40 mg Per Tube q1800  . docusate  100 mg Per Tube BID  . feeding supplement (PROSource TF)  45 mL Per Tube BID  . feeding supplement (VITAL HIGH PROTEIN)  1,000 mL Per Tube Q24H  . fentaNYL      . heparin  5,000 Units Subcutaneous Q8H  . insulin aspart  0-6 Units Subcutaneous TID WC  . [START ON 11/17/2020] midodrine  10 mg Per Tube Q T,Th,Sa-HD  . mirtazapine  7.5 mg Per Tube QHS  . multivitamin  1 tablet Per Tube QHS  . pantoprazole sodium  40 mg Per Tube Daily    OP HD: TTS [place] (last HD 4/2, prior to that last was 3/24)  4h  350/500  82kg  2/2 bath  P4  AVF  Hep none - Hectoral 54mcg IV q HD  Assessment/Plan: 1. Resp failure acute w/ hypoxia and hypercapnia - per CCM due to aspiration pneumonitis, possible OSA / OHS as well. ECHO showed pulm HTN, HFpEF, mod-severe MS.  2. Volume: +leg edema and 1-2kg up again  today. Could not get fluid off yesterday w/ sig symptomatic hypotension in the 70's. CXR's w/o edema. Hypotension on HD may be related to underlying pulm HTN/ valve disease/ OHS/ cor pulmonale. Resp failure doesn't appear to be due to fluid in the lungs.  3.  ESRD: w/ missed OP HD. Has had 3 HD here, last yesterday on schedule. Next HD Tuesday 4/12.   4.  HTN - BP's high but drop on HD.  Cautious use of BP lowering meds as needed.  5.  Anemia: Hgb 11.5 - no ESA need at this time. 6.  Metabolic bone disease: Ca ok, Phos high - Phoslo resumed, follow labs. 7.  T2DM: Hypoglycemia initially, now eating better. A1c 8%. Per primary. 8.  PAD/leg wounds: ischemic/stasis dermatitis per [facility] notes. 9.  CAD/HFrEF   [name], MD 11/15/2020, 8:38 AM

## 2020-11-15 NOTE — Progress Notes (Signed)
RT was called to pts room for a ABG. Pt was lethargic on 2L [place]. Pt is suppose to wear BIpap QHS but has not been compliant with that. Pt has been vomiting several times tonight and vomited whole I was in the room. MD was notified of ABG results and how not able to wear bipap.

## 2020-11-15 NOTE — Progress Notes (Signed)
Brief Nutrition Note RD working remotely.   Consult received for enteral/tube feeding initiation and management.  Adult Enteral Nutrition Protocol initiated. Full assessment to follow.  Admitting Dx: ESRD (end stage renal disease) ([place]) [N18.6] Acute hypercapnic respiratory failure ([place]) [J96.02]  Body mass index is 33.04 kg/m. Pt meets criteria for obesity based on current BMI.  Labs:  Recent Labs  Lab 11/11/2020 1110 11/26/2020 1210 11/11/20 0017 11/11/20 1037 11/13/20 0103 11/14/20 2144 11/15/20 0107 11/15/20 0211  NA 139   < > 139   < > 137 135 135 134*  K 5.8*   < > 6.2*   < > 4.2 3.9 4.4 3.8  CL 98  --  100   < > 98 97* 96*  --   CO2 31  --  27   < > 26 27 26   --   BUN 55*  --  57*   < > 11 14 15   --   CREATININE 8.83*  --  9.05*   < > 3.69* 4.04* 4.05*  --   CALCIUM 9.3  --  8.7*   < > 8.2* 9.2 9.8  --   MG 2.7*  --   --   --   --   --  2.2  --   PHOS  --   --  8.3*  --   --   --  5.1*  --   GLUCOSE 61*  --  93   < > 94 75 84  --    < > = values in this interval not displayed.       Jarome Matin, MS, RD, LDN, CNSC Inpatient Clinical Dietitian RD pager # available in [place]  After hours/weekend pager # available in [facility]

## 2020-11-15 NOTE — Procedures (Signed)
Intubation Procedure Note  Kathleen [name]  [id number]  October 13, 1945  Date:11/15/20  Time:12:26 AM   Provider Performing:Jonael Paradiso Jenetta Downer Arjay Jaskiewicz    Procedure: Intubation (31500)  Indication(s) Respiratory Failure  Consent Risks of the procedure as well as the alternatives and risks of each were explained to the patient and/or caregiver.  Consent for the procedure was obtained and is signed in the bedside chart   Anesthesia Etomidate and Rocuronium   Time Out Verified patient identification, verified procedure, site/side was marked, verified correct patient position, special equipment/implants available, medications/allergies/relevant history reviewed, required imaging and test results available.   Sterile Technique Usual hand hygeine, masks, and gloves were used   Procedure Description Patient positioned in bed supine.  Sedation given as noted above.  Patient was intubated with endotracheal tube using Glidescope.  View was Grade 1 full glottis .  Number of attempts was 1.  Colorimetric CO2 detector was consistent with tracheal placement.   Complications/Tolerance None; patient tolerated the procedure well. Chest X-ray is ordered to verify placement.   EBL None   Specimen(s) None

## 2020-11-15 NOTE — Progress Notes (Signed)
Son updated at bedside.  Julian Hy, DO 11/15/20 10:33 AM [facility]

## 2020-11-15 NOTE — Progress Notes (Signed)
[facility] Progress Note Patient Name: Kathleen [name] DOB: 10/27/1945 MRN: [id number]   Date of Service  11/15/2020  HPI/Events of Note  CBG 64 -> amp of D50 given by RN.  eICU Interventions  Start D10W at 30cc/hr. Q1H CBGs x4 then resume usual checks.     Intervention Category Intermediate Interventions: Other:  Charlott Rakes 11/15/2020, 4:34 AM

## 2020-11-15 NOTE — Progress Notes (Addendum)
Hypoglycemic Event  CBG: 64  Treatment: D50 25 mL (12.5 gm)  Symptoms: None  Follow-up CBG: Time: 0433 CBG Result: 85   Possible Reasons for Event: Inadequate meal intake  Comments/MD notified:eLink notified, spoke w/[name].... awaiting further instructions.     Kathleen [name]

## 2020-11-15 NOTE — Progress Notes (Signed)
[facility] Progress Note Patient Name: Kathleen [name] DOB: 04/15/1946 MRN: [id number]   Date of Service  11/15/2020  HPI/Events of Note  Received notification of BP 195/86. When I evaluate the patient via camera, BP is 167/74.  I spoke to RN who said that he gave some fentanyl to the patient between those two BP readings and that seemed to help. The patient was recently intubated so that may be resulting in some discomfort -> increase in BP.  That said, the patient's baseline BP seems to be mostly in the 160s SBP. She has ESRD on HD.   eICU Interventions  Treat pain first as this is likely contributing to her higher than usual BP.  If SBP remains > 170 mmHg after adequately treating pain, I have ordered hydralazine 10mg  IV Q4H PRN SBP > 170.     Intervention Category Intermediate Interventions: Hypotension - evaluation and management  [name] 11/15/2020, 1:28 AM

## 2020-11-15 NOTE — Progress Notes (Signed)
Pt transported to 2M07 for intubation

## 2020-11-16 ENCOUNTER — Inpatient Hospital Stay (HOSPITAL_COMMUNITY): Payer: Medicare HMO

## 2020-11-16 DIAGNOSIS — I2510 Atherosclerotic heart disease of native coronary artery without angina pectoris: Secondary | ICD-10-CM | POA: Diagnosis not present

## 2020-11-16 DIAGNOSIS — J90 Pleural effusion, not elsewhere classified: Secondary | ICD-10-CM | POA: Diagnosis not present

## 2020-11-16 DIAGNOSIS — Z7189 Other specified counseling: Secondary | ICD-10-CM | POA: Diagnosis not present

## 2020-11-16 DIAGNOSIS — E8779 Other fluid overload: Secondary | ICD-10-CM | POA: Diagnosis not present

## 2020-11-16 DIAGNOSIS — Z992 Dependence on renal dialysis: Secondary | ICD-10-CM | POA: Diagnosis not present

## 2020-11-16 DIAGNOSIS — I5189 Other ill-defined heart diseases: Secondary | ICD-10-CM | POA: Diagnosis not present

## 2020-11-16 DIAGNOSIS — R531 Weakness: Secondary | ICD-10-CM | POA: Diagnosis not present

## 2020-11-16 DIAGNOSIS — I509 Heart failure, unspecified: Secondary | ICD-10-CM | POA: Diagnosis not present

## 2020-11-16 DIAGNOSIS — J9602 Acute respiratory failure with hypercapnia: Secondary | ICD-10-CM | POA: Diagnosis not present

## 2020-11-16 DIAGNOSIS — N186 End stage renal disease: Secondary | ICD-10-CM | POA: Diagnosis not present

## 2020-11-16 DIAGNOSIS — J9811 Atelectasis: Secondary | ICD-10-CM | POA: Diagnosis not present

## 2020-11-16 DIAGNOSIS — R0902 Hypoxemia: Secondary | ICD-10-CM | POA: Diagnosis not present

## 2020-11-16 DIAGNOSIS — J811 Chronic pulmonary edema: Secondary | ICD-10-CM | POA: Diagnosis not present

## 2020-11-16 DIAGNOSIS — I517 Cardiomegaly: Secondary | ICD-10-CM | POA: Diagnosis not present

## 2020-11-16 DIAGNOSIS — D649 Anemia, unspecified: Secondary | ICD-10-CM | POA: Diagnosis not present

## 2020-11-16 DIAGNOSIS — I5020 Unspecified systolic (congestive) heart failure: Secondary | ICD-10-CM | POA: Diagnosis not present

## 2020-11-16 DIAGNOSIS — E875 Hyperkalemia: Secondary | ICD-10-CM | POA: Diagnosis not present

## 2020-11-16 DIAGNOSIS — I10 Essential (primary) hypertension: Secondary | ICD-10-CM | POA: Diagnosis not present

## 2020-11-16 DIAGNOSIS — I132 Hypertensive heart and chronic kidney disease with heart failure and with stage 5 chronic kidney disease, or end stage renal disease: Secondary | ICD-10-CM | POA: Diagnosis not present

## 2020-11-16 LAB — CBC
HCT: 36.3 % (ref 36.0–46.0)
Hemoglobin: 11.2 g/dL — ABNORMAL LOW (ref 12.0–15.0)
MCH: 30.7 pg (ref 26.0–34.0)
MCHC: 30.9 g/dL (ref 30.0–36.0)
MCV: 99.5 fL (ref 80.0–100.0)
Platelets: 135 10*3/uL — ABNORMAL LOW (ref 150–400)
RBC: 3.65 MIL/uL — ABNORMAL LOW (ref 3.87–5.11)
RDW: 13.7 % (ref 11.5–15.5)
WBC: 5.9 10*3/uL (ref 4.0–10.5)
nRBC: 0 % (ref 0.0–0.2)

## 2020-11-16 LAB — BASIC METABOLIC PANEL
Anion gap: 7 (ref 5–15)
BUN: 34 mg/dL — ABNORMAL HIGH (ref 8–23)
CO2: 27 mmol/L (ref 22–32)
Calcium: 8.9 mg/dL (ref 8.9–10.3)
Chloride: 98 mmol/L (ref 98–111)
Creatinine, Ser: 5.14 mg/dL — ABNORMAL HIGH (ref 0.44–1.00)
GFR, Estimated: 8 mL/min — ABNORMAL LOW (ref >60)
Glucose, Bld: 288 mg/dL — ABNORMAL HIGH (ref 70–99)
Potassium: 3.8 mmol/L (ref 3.5–5.1)
Sodium: 132 mmol/L — ABNORMAL LOW (ref 135–145)

## 2020-11-16 LAB — GLUCOSE, CAPILLARY
Glucose-Capillary: 269 mg/dL — ABNORMAL HIGH (ref 70–99)
Glucose-Capillary: 236 mg/dL — ABNORMAL HIGH (ref 70–99)
Glucose-Capillary: 218 mg/dL — ABNORMAL HIGH (ref 70–99)
Glucose-Capillary: 278 mg/dL — ABNORMAL HIGH (ref 70–99)
Glucose-Capillary: 281 mg/dL — ABNORMAL HIGH (ref 70–99)
Glucose-Capillary: 260 mg/dL — ABNORMAL HIGH (ref 70–99)

## 2020-11-16 LAB — PHOSPHORUS: Phosphorus: 1.5 mg/dL — ABNORMAL LOW (ref 2.5–4.6)

## 2020-11-16 LAB — MAGNESIUM: Magnesium: 1.9 mg/dL (ref 1.7–2.4)

## 2020-11-16 MED ORDER — POLYETHYLENE GLYCOL 3350 17 G PO PACK
17.0000 g | PACK | Freq: Every day | ORAL | Status: DC
  Administered 2020-11-16 – 2020-11-24 (×4): 17 g
  Filled 2020-11-16 (×4): qty 1

## 2020-11-16 MED ORDER — VITAL AF 1.2 CAL PO LIQD
1000.0000 mL | ORAL | Status: DC
  Administered 2020-11-16 – 2020-11-17 (×4): 1000 mL

## 2020-11-16 MED ORDER — VITAL AF 1.2 CAL PO LIQD: 1000.0000 mL | ORAL | Status: DC

## 2020-11-16 MED ORDER — INSULIN GLARGINE 100 UNIT/ML ~~LOC~~ SOLN
15.0000 [IU] | Freq: Every day | SUBCUTANEOUS | Status: DC
  Administered 2020-11-16 – 2020-11-17 (×2): 15 [IU] via SUBCUTANEOUS
  Filled 2020-11-16 (×3): qty 0.15

## 2020-11-16 MED ORDER — POTASSIUM & SODIUM PHOSPHATES 280-160-250 MG PO PACK
1.0000 | PACK | Freq: Once | ORAL | Status: AC
  Administered 2020-11-16: 1
  Filled 2020-11-16: qty 1

## 2020-11-16 MED ORDER — INSULIN ASPART 100 UNIT/ML ~~LOC~~ SOLN
0.0000 [IU] | SUBCUTANEOUS | Status: DC
  Administered 2020-11-16 (×3): 5 [IU] via SUBCUTANEOUS
  Administered 2020-11-16: 3 [IU] via SUBCUTANEOUS
  Administered 2020-11-17: 2 [IU] via SUBCUTANEOUS
  Administered 2020-11-17: 5 [IU] via SUBCUTANEOUS
  Administered 2020-11-17: 3 [IU] via SUBCUTANEOUS
  Administered 2020-11-17: 5 [IU] via SUBCUTANEOUS
  Administered 2020-11-17: 3 [IU] via SUBCUTANEOUS
  Administered 2020-11-17: 5 [IU] via SUBCUTANEOUS
  Administered 2020-11-18: 3 [IU] via SUBCUTANEOUS

## 2020-11-16 NOTE — Plan of Care (Signed)
Patient was able to come off of the cleviprex gtt this shift and has maintained stable BP since that has been off. Patient has maintained O2 saturations this shift. Patient's tube feeds were changed by dietary and she has tolerated them thus far. Patient was able to urinate this morning   Problem: Clinical Measurements: Goal: Ability to maintain clinical measurements within normal limits will improve Outcome: Progressing Goal: Diagnostic test results will improve Outcome: Progressing   Problem: Nutrition: Goal: Adequate nutrition will be maintained Outcome: Progressing   Problem: Elimination: Goal: Will not experience complications related to urinary retention Outcome: Progressing   Patient remains intubated and lightly sedated and is unable to convey if she has processed the education. Patient does still seem to have some anxiety/pain related to moving in the bed and dressing changes, mostly relieved with PRN medications. Patient is turned every 2 hours but continues to have fragile skin and wounds to bilateral lower extremities.   Problem: Education: Goal: Knowledge of General Education information will improve Description: Including pain rating scale, medication(s)/side effects and non-pharmacologic comfort measures Outcome: Not Progressing   Problem: Coping: Goal: Level of anxiety will decrease Outcome: Not Progressing   Problem: Pain Managment: Goal: General experience of comfort will improve Outcome: Not Progressing   Problem: Skin Integrity: Goal: Risk for impaired skin integrity will decrease Outcome: Not Progressing

## 2020-11-16 NOTE — Progress Notes (Addendum)
NAME:  Kathleen [name], MRN:  [id number], DOB:  1946-05-13, LOS: 5 ADMISSION DATE:  12/01/2020, CONSULTATION DATE:  4/9 REFERRING MD:  Dr. McDiarmid, CHIEF COMPLAINT:  Pulmonary hypertension  History of Present Illness:  75 year old female with PMH as below, which is significant for ESRD, HFpEF, CVA, DM, CAD and tobacco abuse. She presented to [facility] ED 4/5 with complaints of progressive weakness x 2 week and has missed HD for the entire week prior to presentation. No other specific symptoms at the time of admission. In the emergency department she was found to have hypercarbic respiratory failure and was started on BiPAP. It was felt that with HD she would improve. She largely did improve with HD, despite difficulty using NIMV nocturnally.  Echocardiogram was done as part of the workup and showed markedly elevated pulmonary artery systolic pressure of 27-51%. PCCM was consulted for new diagnosis of pulmonary hypertension.   Later that evening she had vomiting episodes and developed somnolence. She was found to be hypercapneic and was moved to the ICU for intubation and mechanical ventilation.  Pertinent  Medical History   has a past medical history of Acute CVA (cerebrovascular accident) ([place]) (11/08/2017), Acute respiratory failure with hypercapnia ([place]), Acute respiratory failure with hypoxia ([place]) (01/29/2019), Altered mental status (07/14/2017), Anemia, Arthritis, Asthma, Benign neoplasm of ascending colon, Benign neoplasm of cecum, Benign neoplasm of sigmoid colon, BPPV (benign paroxysmal positional vertigo) (01/22/2014), CAD (coronary artery disease), CVA (cerebral vascular accident) ([place]) (11/09/2017), Diabetes mellitus without complication ([place]), Disorientation, ESRD (end stage renal disease) ([place]), Gastritis and gastroduodenitis, Hematochezia, History of echocardiogram, transfusion, Hyperlipidemia, Hypernatremia, Hypertension, Hypertensive urgency (01/21/2014), Meningioma ([place]), Noncompliance with  medication regimen, Obesity, Pleural effusion, left, Pulmonary edema (01/29/2019), Pulmonary hypertension due to sleep-disordered breathing ([place]) (11/14/2020), Pulmonary vascular congestion, Rectal bleeding (09/01/2018), Renal insufficiency, Syncope and collapse (70/0/1749), Systolic and diastolic CHF, acute ([place]), Systolic CHF ([place]), and Tobacco abuse (01/21/2014).   Significant Hospital Events: Including procedures, antibiotic start and stop dates in addition to other pertinent events   . 4/4 admit  . 4/8 Echo LVEF 60-65%. RV systolic function normal. Mod/sev mitral stenosis, mod TR.  . 4/11 remains on vent   Interim History / Subjective:   intubated on life support. Critically ill. Fevers overnight   Objective   Blood pressure (!) 112/57, pulse 84, temperature (!) 100.5 F (38.1 C), temperature source Oral, resp. rate (!) 22, height 5\' 3"  (1.6 m), weight 87.5 kg, SpO2 95 %.    Vent Mode: PRVC FiO2 (%):  [40 %] 40 % Set Rate:  [22 bmp] 22 bmp Vt Set:  [420 mL] 420 mL PEEP:  [5 cmH20] 5 cmH20 Plateau Pressure:  [23 cmH20-26 cmH20] 23 cmH20   Intake/Output Summary (Last 24 hours) at 11/16/2020 0811 Last data filed at 11/16/2020 0700 Gross per 24 hour  Intake 1644.12 ml  Output --  Net 1644.12 ml   Filed Weights   11/14/20 1200 11/15/20 0600 11/16/20 0500  Weight: 85.2 kg 84.6 kg 87.5 kg    Examination: General: elderly fm, critically ill intubated on life support  HENT: NCAT ett in place  Lungs: BL vented breaths  Cardiovascular: RRR, s1 s2  Abdomen: obese, soft, nt nd  Extremities: BL LE wrapped in ace  Neuro: sedated, responds to pain  Labs/imaging that I have personally reviewed  (right click and "Reselect all SmartList Selections" daily)  ECHO: LVEF 60-65%. RV systolic function normal. Mod/sev mitral stenosis, mod TR.  Resolved Hospital Problem list  Assessment & Plan:   Acute respiratory failure with hypoxia and hypercapnia requiring MV due to aspiration  pneumonitis Low grade fevers P: Adult mach vent protocol  VAP ppx  PAD guideline sedation  Attempt SAT SBT today  CXR today, monitor off abx  Low threshold to start abx   Pulmonary hypertension as identified of transthoracic echocardiogram 4/8. Right ventricular systolic pressure is estimated at 79.6. She has a history concerning for group 2 disease in the setting of known hypertension, HFpEF, and valvular heart disease (mod to severe mitral stenosis). Also concern for group 3 disease considering likely undiagnosed OSA/OHS. I doubt cteph but can get work up in outpatient  Acute on chronic diastolic heart failure  Probable OSA, at risk for OHS Moderate to severe MS HTN P: Volume management with HD Needs OP PSG, and referral to pulmonary, Dr. Silas Flood for [facility] evaluation/management at time of discharge     Hypoglycemia; h/o DM - TF continued  - CBGS  ESRD, hypervolemia from dialysis noncompliance - iHD per nephrology   History of CVA/ CAD -ASA, statin    Best practice (right click and "Reselect all SmartList Selections" daily)  Diet:  Tube Feed  Pain/Anxiety/Delirium protocol (if indicated): Yes (RASS goal 0) VAP protocol (if indicated): Yes DVT prophylaxis: Subcutaneous Heparin GI prophylaxis: N/A and PPI Glucose control:  SSI Yes Central venous access:  N/A Arterial line:  N/A Foley:  N/A Mobility:  bed rest  PT consulted: N/A Last date of multidisciplinary goals of care discussion [ ]  Code Status:  full code Disposition: ICU  Labs   CBC: Recent Labs  Lab 11/16/2020 1110 11/30/2020 1210 11/11/20 0018 11/12/20 0431 11/14/20 2144 11/15/20 0107 11/15/20 0211 11/16/20 0013  WBC 5.1  --  4.8 5.1 6.6 5.5  --  5.9  NEUTROABS 3.3  --   --   --   --   --   --   --   HGB 12.6   < > 11.5* 10.9* 11.0* 12.8 12.6 11.2*  HCT 43.3   < > 39.9 37.7 39.0 46.2* 37.0 36.3  MCV 106.4*  --  107.5* 108.6* 109.2* 111.1*  --  99.5  PLT 193  --  154 152 131* 124*  --  135*   < > =  values in this interval not displayed.    Basic Metabolic Panel: Recent Labs  Lab 11/26/2020 1110 11/22/2020 1210 11/11/20 0017 11/11/20 1037 11/12/20 0431 11/13/20 0103 11/14/20 2144 11/15/20 0107 11/15/20 0211 11/16/20 0013  NA 139   < > 139   < > 133* 137 135 135 134* 132*  K 5.8*   < > 6.2*   < > 4.7 4.2 3.9 4.4 3.8 3.8  CL 98  --  100   < > 96* 98 97* 96*  --  98  CO2 31  --  27   < > 31 26 27 26   --  27  GLUCOSE 61*  --  93   < > 160* 94 75 84  --  288*  BUN 55*  --  57*   < > 27* 11 14 15   --  34*  CREATININE 8.83*  --  9.05*   < > 5.85* 3.69* 4.04* 4.05*  --  5.14*  CALCIUM 9.3  --  8.7*   < > 8.2* 8.2* 9.2 9.8  --  8.9  MG 2.7*  --   --   --   --   --   --  2.2  --  1.9  PHOS  --   --  8.3*  --   --   --   --  5.1*  --  1.5*   < > = values in this interval not displayed.   GFR: Estimated Creatinine Clearance: 9.8 mL/min (A) (by C-G formula based on SCr of 5.14 mg/dL (H)). Recent Labs  Lab 11/11/20 1522 11/12/20 0431 11/14/20 2144 11/15/20 0107 11/15/20 0108 11/16/20 0013  WBC  --  5.1 6.6 5.5  --  5.9  LATICACIDVEN 0.9  --   --   --  1.0  --     This patient is critically ill with multiple organ system failure; which, requires frequent high complexity decision making, assessment, support, evaluation, and titration of therapies. This was completed through the application of advanced monitoring technologies and extensive interpretation of multiple databases. During this encounter critical care time was devoted to patient care services described in this note for 32 minutes.   Garner Nash, DO [facility] 11/16/2020 8:11 AM

## 2020-11-16 NOTE — Progress Notes (Signed)
Nutrition Follow-up  DOCUMENTATION CODES:  Obesity unspecified  INTERVENTION:  Adjust tube feeding via OGT: Vital AF 1.2 at 65 ml/h (1560 ml per day) Provides 1872 kcal, 117 gm protein, 1265 ml free water daily  Continue rena-vit via tube daily  NUTRITION DIAGNOSIS:  Increased nutrient needs related to chronic illness as evidenced by estimated needs.  Progressing  GOAL:  Patient will meet greater than or equal to 90% of their needs  Tube feed infusing  MONITOR:  TF tolerance,Skin,Vent status,Labs,I & O's  REASON FOR ASSESSMENT:  Consult Enteral/tube feeding initiation and management  ASSESSMENT:  Kathleen [name] is a pleasant 76 year old female with a past medical history of ESRD on hemodialysis, type 2 diabetes mellitus, CAD, CHF (last echo 11/25/2017 with EF 60 to 65% and grade 1 diastolic dysfunction), hypertension, peripheral arterial disease, CVA who presents with gradual onset of 2 weeks of weakness, she notes she has not gone to dialysis in the past week.  Pt's respiratory status improved with HD but pt was unable to tolerate BiPAP (repeatedly removing mask). Worsening mental status on 4/9 with altered blood gas values. Moved to ICU and emergently intubated. Remains intubated and sedated at the time of assessment, minimal vent requirements at this time. Noted poor intake prior to intubation (34% average intake x 5 meals, ranges from 0-85% consumed). Son in room able to provide a nutrition hx. Reports that pt was not compliant with diet at home. Reports pt often consuming fried foods and that main beverage was sprite or ginger ale. Reports pt would typically eat two meals a day with snacks throughout the day. Some days would be feeling poorly and would have very little intake. Vital High Protein TF currently infusing at 58mL/h. Will adjust for adjustment of estimated energy needs. Discussed with RN.  Patient is currently intubated on ventilator support MV: 9.2 L/min Temp (24hrs),  Avg:100 F (37.8 C), Min:98.5 F (36.9 C), Max:100.7 F (38.2 C)  Current tube feeding order via OGT (terminates gastric): Vital High Protein at 40 ml/h (960 ml per day) Prosource TF 45 ml BID Provides 1040 kcal, 106 gm protein, 803 ml free water daily  Per [facility] notes, pt with chronic BLE wounds related to venous insufficiency and PAD.  Medications reviewed and include: colace, insulin, remeron, rena-vit Labs reviewed: Na 132, elevated glucose (89-288mg /dL over the last 24 hours), BUN 34, creatinine, 5.14, phosphorus 1.5  4/9 - Decline in status, transferred to ICU. Intubated 4/10 - TF initiated via OGT   NUTRITION - FOCUSED PHYSICAL EXAM: Flowsheet Row Most Recent Value  Orbital Region Mild depletion  Upper Arm Region No depletion  Thoracic and Lumbar Region No depletion  Buccal Region No depletion  Temple Region No depletion  Clavicle Bone Region No depletion  Clavicle and Acromion Bone Region No depletion  Scapular Bone Region No depletion  Dorsal Hand Unable to assess  [mittens and restraints in place]  Patellar Region No depletion  Anterior Thigh Region No depletion  Posterior Calf Region Unable to assess  [wrappings from ankle to knee from WOC]  Edema (RD Assessment) Moderate  [generalized]  Hair Reviewed  Eyes Unable to assess  Mouth Unable to assess  Skin Reviewed  Nails Reviewed    Adipose tissue and edema could be masking signs of malnutrition  Diet Order:   Diet Order            Diet NPO time specified  Diet effective now  EDUCATION NEEDS:  Not appropriate for education at this time  Skin:  Skin Assessment: Skin Integrity Issues: Skin Integrity Issues:: Other (Comment) Other: chronic BLE wounds related to veous insufficiency and PAD  Last BM:  4/8 per RN documentation  Height:  Ht Readings from Last 1 Encounters:  11/29/2020 5\' 3"  (1.6 m)    Weight:  Wt Readings from Last 1 Encounters:  11/16/20 87.5 kg    Ideal Body Weight:   52.3 kg  BMI:  Body mass index is 34.17 kg/m.  Estimated Nutritional Needs:   Kcal:  1800-2000 kcal  Protein:  105-131 g (2-2.5g IBW)  Fluid:  1048mL + UOP  Ranell Patrick, RD, LDN Clinical Dietitian Pager on Amion

## 2020-11-16 NOTE — Consult Note (Signed)
Consultation Note Date: 11/16/2020   Patient Name: Kathleen [name]  DOB: 09/02/1945  MRN: [id number]  Age / Sex: 75 y.o., female  PCP: Benito Mccreedy, MD Referring Physician: Garner Nash, DO  Reason for Consultation: Establishing goals of care  HPI/Patient Profile: 75 y.o. female  with past medical history of on hemodialysis Tuesday Thursday Saturday, CHF with EF approximately 60% also with mitral stenosis, PAD, DM, CAD, HLD, HTN, admitted on 11/29/2020 with increasing weakness and nausea, reported she had missed dialysis for 2 weeks due to not feeling like going.  Admission has been complicated with respiratory acidosis, hypercapnea, she did not tolerate BiPAP-pulling it off.  On April 7 she had worsening confusion.  April 9 it was noted she likely has pulmonary hypertension she had worsening lethargy and vomiting and required intubation.  Continues to be ventilated with likely aspiration pneumonitis due to aspiration event.  She was hypotensive during dialysis.  Palliative medicine consulted for goals of care.   Clinical Assessment and Goals of Care: Reviewed patient's chart and received report from patient's nurse. Patient was last seen by palliative medicine in 2017. At that time she was declining dialysis.  A MOST form was completed at that time indicating DNR DNI no PEG tube.  Outpatient palliative follow-up was recommended-it is unclear if this happened. Appears she started dialysis in July 2018.  Today Ms. Hoskie is intubated, she does open her eyes to verbal stimulation however does not follow commands.  She is on fentanyl at about 25 mcg/h.  No other sedation. Weaning attempts were made this morning, however her breaths were very weak and at a low rate and she was not able to be extubated. There was no family at the bedside.  I called patient's son Kathleen [name] who it is reported that patient lives with  and left a voicemail.  I also called patient's daughter, Kathleen [name].  Introduced the role of palliative medicine.  Kathleen [name] shares that all of the children together are involved in decision making for patient-there is not an [facility] POA.  Kathleen [name] is in agreement to meet with palliative medicine along with her brothers Kathleen [name] and Kathleen [name].  She requested that I call Kathleen [name] to coordinate.   Primary Decision Maker NEXT OF KIN-patient's children Roque Lias and Kathleen [name]    SUMMARY OF RECOMMENDATIONS. -Continue full scope full code for now -Message left for patient's son Kathleen [name] to coordinate further goals of care discussion with the patient's children and PMT -Constipation- noted patient has not had bowel movement since 4 8 this was confirmed with nursing she is on Colace for 3 days with no results, will add MiraLAX  Code Status/Advance Care Planning:  Full code  Palliative Prophylaxis:   Delirium Protocol and Frequent Pain Assessment  Additional Recommendations (Limitations, Scope, Preferences):  Full Scope Treatment  Prognosis:    Unable to determine  Discharge Planning: To Be Determined  Primary Diagnoses: Present on Admission: . ESRD (end stage renal disease) ([place]) . Chronic respiratory failure with hypercapnia ([place]) . Balance problems . Muscle  weakness (generalized) . Somnolence . DM (diabetes mellitus), type 2 with renal complications ([place]) . Hypertension associated with diabetes ([place]) . Pulmonary hypertension due to sleep-disordered breathing ([place]) . Mitral stenosis . Acute hypercapnic respiratory failure ([place])   I have reviewed the medical record, interviewed the patient and family, and examined the patient. The following aspects are pertinent.  Past Medical History:  Diagnosis Date  . Acute CVA (cerebrovascular accident) ([place]) 11/08/2017   Brain MRI 11/08/2017:Two subcentimeter lacunar infarctions in the left corona radiata. These are probably early subacute in age    . Acute  respiratory failure with hypercapnia ([place])   . Acute respiratory failure with hypoxia ([place]) 01/29/2019  . Altered mental status 07/14/2017  . Anemia    a. 01/2014: suspected of chronic disease, negative FOBT.  [name]    knees  . Asthma    many years ago  . Benign neoplasm of ascending colon   . Benign neoplasm of cecum   . Benign neoplasm of sigmoid colon   . BPPV (benign paroxysmal positional vertigo) 01/22/2014  . CAD (coronary artery disease)    a. Cath 01/2014: mild in LAD, mild-mod LCx, mod-severe RCA - for med rx.  . CVA (cerebral vascular accident) ([place]) 11/09/2017   Brain MRI: Two subcentimeter lacunar infarctions in the left corona radiata. These are probably early subacute  . Diabetes mellitus without complication ([place])   . Disorientation   . ESRD (end stage renal disease) ([place])    [place]  . Gastritis and gastroduodenitis   . Hematochezia   . History of echocardiogram    Echo (10/15):  Mod LVH, EF 50-55%, Gr 1 DD, MAC, mild MR, mild TR, PASP 35 mmHg  . Hx of transfusion   . Hyperlipidemia   . Hypernatremia   . Hypertension   . Hypertensive urgency 01/21/2014  . Meningioma ([place])    a. Incidental dx 01/2014.  [name] Noncompliance with medication regimen   . Obesity   . Pleural effusion, left   . Pulmonary edema 01/29/2019  . Pulmonary hypertension due to sleep-disordered breathing ([place]) 11/14/2020  . Pulmonary vascular congestion   . Rectal bleeding 09/01/2018  . Renal insufficiency   . Syncope and collapse 06/10/2014  . Systolic and diastolic CHF, acute ([place])   . Systolic CHF ([place])    a. 0/2725: dx with mixed ICM/NICM (out of proportion to CAD) EF 25-30% by echo.   . Tobacco abuse 01/21/2014   Tobacco abuse    Social History   Socioeconomic History  . Marital status: Divorced    Spouse name: Not on file  . Number of children: Not on file  . Years of education: Not on file  . Highest education level: Not on file  Occupational History  . Not on file   Tobacco Use  . Smoking status: Former Smoker    Packs/day: 0.25    Years: 30.00    Pack years: 7.50    Types: Cigarettes  . Smokeless tobacco: Never Used  . Tobacco comment: quit late 1990's  Vaping Use  . Vaping Use: Never used  Substance and Sexual Activity  . Alcohol use: No  . Drug use: No  . Sexual activity: Never  Other Topics Concern  . Not on file  Social History Narrative  . Not on file   Social Determinants of Health   Financial Resource Strain: Not on file  Food Insecurity: Not on file  Transportation Needs: Not on file  Physical Activity: Not on file  Stress: Not on file  Social Connections: Not on file   Scheduled Meds: . aspirin  81 mg Per Tube Daily  . atorvastatin  40 mg Per Tube q1800  . docusate  100 mg Per Tube BID  . heparin  5,000 Units Subcutaneous Q8H  . insulin aspart  0-9 Units Subcutaneous Q4H  . insulin glargine  15 Units Subcutaneous Daily  . mouth rinse  15 mL Mouth Rinse 10 times per day  . [START ON 11/17/2020] midodrine  10 mg Per Tube Q T,Th,Sa-HD  . mirtazapine  7.5 mg Per Tube QHS  . multivitamin  1 tablet Per Tube QHS  . pantoprazole sodium  40 mg Per Tube Daily   Continuous Infusions: . sodium chloride 10 mL/hr at 11/16/20 1300  . feeding supplement (VITAL AF 1.2 CAL) 1,000 mL (11/16/20 1140)  . fentaNYL infusion INTRAVENOUS 25 mcg/hr (11/16/20 1300)   PRN Meds:.acetaminophen **OR** acetaminophen, fentaNYL, fentaNYL (SUBLIMAZE) injection, fentaNYL (SUBLIMAZE) injection, hydrALAZINE Medications Prior to Admission:  Prior to Admission medications   Medication Sig Start Date End Date Taking? Authorizing Provider  acetaminophen (TYLENOL) 325 MG tablet Take 2 tablets (650 mg total) by mouth every 4 (four) hours as needed for mild pain (or temp > 37.5 C (99.5 F)). Patient taking differently: Take 650 mg by mouth 2 (two) times daily. 11/11/17  Yes [name], MD  aspirin EC 81 MG tablet Take 81 mg by mouth daily. Swallow whole.   Yes  [provider]  atorvastatin (LIPITOR) 40 MG tablet Take 1 tablet (40 mg total) by mouth daily at 6 PM. 07/19/17  Yes Aline August, MD  calcium acetate (PHOSLO) 667 MG capsule Take 1 capsule (667 mg total) by mouth as needed (with snacks). Patient taking differently: Take 667-2,001 mg by mouth See admin instructions. Take 3 capsules (2001 mg totally) by mouth 3 times daily with a meal; Take 1 tablet (667 mg totally) by mouth 2 times daily with snacks 11/11/17  Yes [name], [name], MD  carvedilol (COREG) 25 MG tablet Take 25 mg by mouth 2 (two) times daily. 03/28/17  Yes [provider]  hydrALAZINE (APRESOLINE) 25 MG tablet Take 1 tablet (25 mg total) by mouth every 8 (eight) hours as needed (SBP > 170  or DBP > 110). Patient taking differently: Take 50 mg by mouth daily. 02/20/17  Yes Mikhail, Velta Addison, DO  Insulin Glargine (BASAGLAR KWIKPEN) 100 UNIT/ML Inject 30 Units into the skin at bedtime. 10/07/20  Yes [provider]  mirtazapine (REMERON) 7.5 MG tablet Take 7.5 mg by mouth at bedtime. 10/07/20  Yes [provider]  Multiple Vitamins-Minerals (RENAPLEX) TABS Take 1 tablet by mouth daily. 07/10/18  Yes [provider]  pantoprazole (PROTONIX) 40 MG tablet Take 1 tablet (40 mg total) by mouth 2 (two) times daily before a meal. Take 1 tablet twice daily for a  Month then once dailly Patient taking differently: Take 40 mg by mouth daily. 09/04/18  Yes [name], Vella Redhead, MD  polyethylene glycol (MIRALAX / GLYCOLAX) packet Take 17 g by mouth daily. Patient taking differently: Take 17 g by mouth daily as needed for mild constipation. 02/20/17  Yes Mikhail, Velta Addison, DO  triamcinolone cream (KENALOG) 0.5 % Apply 1 application topically 2 (two) times daily as needed for rash. 10/07/20  Yes [provider]  brimonidine-timolol (COMBIGAN) 0.2-0.5 % ophthalmic solution Place 1 drop into both eyes every 12 (twelve) hours.    [provider]   Allergies   Allergen Reactions  .  Baclofen Other (See Comments)    Severe delirium when given to pateint in Dec 2018.   . Chlorhexidine Rash   Review of Systems  Unable to perform ROS: Intubated    Physical Exam Vitals and nursing note reviewed.  Constitutional:      Appearance: She is ill-appearing.  Pulmonary:     Comments: intubated Neurological:     Comments: Wakes to verbal, does not follow commands, chewing on tube, quickly back to sleep  Psychiatric:     Comments: Grimacing while awake     Vital Signs: BP (!) 146/76   Pulse 86   Temp 98.5 F (36.9 C) (Oral)   Resp (!) 22   Ht _0  (1.6 m)   Wt 87.5 kg   LMP  (LMP Unknown)   SpO2 98%   BMI 34.17 kg/m  Pain Scale: CPOT POSS *See Group Information*: 2-Acceptable,Slightly drowsy, easily aroused Pain Score: 2    SpO2: SpO2: 98 % O2 Device:SpO2: 98 % O2 Flow Rate: .O2 Flow Rate (L/min): 6 L/min  IO: Intake/output summary:   Intake/Output Summary (Last 24 hours) at 11/16/2020 1413 Last data filed at 11/16/2020 1300 Gross per 24 hour  Intake 1536.28 ml  Output --  Net 1536.28 ml    LBM: Last BM Date: 11/13/20 Baseline Weight: Weight: 88.7 kg Most recent weight: Weight: 87.5 kg     Palliative Assessment/Data: PPS: 10%     Thank you for this consult. Palliative medicine will continue to follow and assist as needed.   Time In: 1319 Time Out: 1429 Time Total: 70 minutes Greater than 50%  of this time was spent counseling and coordinating care related to the above assessment and plan.  Signed by: Mariana Kaufman, AGNP-C Palliative Medicine    Please contact Palliative Medicine Team phone at [phone] for questions and concerns.  For individual provider: See Shea Evans

## 2020-11-16 NOTE — Progress Notes (Addendum)
[facility] Progress Note   Subjective:  Seen in ICU. No acute events overnight  Objective Vitals:   11/16/20 0845 11/16/20 0900 11/16/20 0915 11/16/20 0930  BP: (!) 83/71 136/87 (!) 136/95 (!) 179/78  Pulse: 83 92 83 83  Resp: 20 (!) 22 19 (!) 22  Temp:      TempSrc:      SpO2: 98% 97% 97% 97%  Weight:      Height:       Physical Exam General: Elderly woman, NAD Neck: flat neck veins Heart: RRR; no murmur Lungs: CTA bilat ant and lateral Abdomen: soft, non-tender Extremities: 1+ bilat dependent hip edema; lower legs wrapped Dialysis Access: LUE AVF + bruit  Additional Objective Labs: Basic Metabolic Panel: Recent Labs  Lab 11/11/20 0017 11/11/20 1037 11/14/20 2144 11/15/20 0107 11/15/20 0211 11/16/20 0013  NA 139   < > 135 135 134* 132*  K 6.2*   < > 3.9 4.4 3.8 3.8  CL 100   < > 97* 96*  --  98  CO2 27   < > 27 26  --  27  GLUCOSE 93   < > 75 84  --  288*  BUN 57*   < > 14 15  --  34*  CREATININE 9.05*   < > 4.04* 4.05*  --  5.14*  CALCIUM 8.7*   < > 9.2 9.8  --  8.9  PHOS 8.3*  --   --  5.1*  --  1.5*   < > = values in this interval not displayed.   Liver Function Tests: Recent Labs  Lab 11/22/2020 1110 11/11/20 0017  AST 23  --   ALT 19  --   ALKPHOS 66  --   BILITOT 0.4  --   PROT 6.8  --   ALBUMIN 3.5 3.0*   Recent Labs  Lab 11/20/2020 1110  LIPASE 33   CBC: Recent Labs  Lab 12/03/2020 1110 11/24/2020 1210 11/11/20 0018 11/12/20 0431 11/14/20 2144 11/15/20 0107 11/15/20 0211 11/16/20 0013  WBC 5.1  --  4.8 5.1 6.6 5.5  --  5.9  NEUTROABS 3.3  --   --   --   --   --   --   --   HGB 12.6   < > 11.5* 10.9* 11.0* 12.8 12.6 11.2*  HCT 43.3   < > 39.9 37.7 39.0 46.2* 37.0 36.3  MCV 106.4*  --  107.5* 108.6* 109.2* 111.1*  --  99.5  PLT 193  --  154 152 131* 124*  --  135*   < > = values in this interval not displayed.   CBG: Recent Labs  Lab 11/15/20 1544 11/15/20 1925 11/15/20 2322 11/16/20 0316 11/16/20 0726   GLUCAP 166* 239* 262* 269* 236*   Studies/Results: DG Abd 1 View  Result Date: 11/15/2020 CLINICAL DATA:  Orogastric tube placement EXAM: ABDOMEN - 1 VIEW COMPARISON:  12/22/2015 FINDINGS: Enteric tube which at least reaches the stomach, tip not visualized. Hazy appearance of the bilateral lower chest as described on dedicated chest x-ray. Cardiomegaly. No dilated bowel seen in the covered abdomen. IMPRESSION: Enteric tube with tip at least reaching the distal stomach. Electronically Signed   By: Monte Fantasia M.D.   On: 11/15/2020 05:49   DG CHEST PORT 1 VIEW  Result Date: 11/15/2020 CLINICAL DATA:  Intubated EXAM: PORTABLE CHEST 1 VIEW COMPARISON:  11/24/2020, 04/11/2020 FINDINGS: Interval intubation, tip of the endotracheal tube is about 1.5 cm superior  to carina. Esophageal tube tip below the diaphragm but incompletely visualized. Cardiomegaly with vascular congestion. Probable trace pleural effusions. No focal consolidation or pneumothorax IMPRESSION: 1. Endotracheal tube tip is about 1.5 cm superior to carina. 2. Cardiomegaly with vascular congestion and probable trace pleural effusions. Electronically Signed   By: Donavan Foil M.D.   On: 11/15/2020 00:34   Medications: . sodium chloride 10 mL/hr at 11/16/20 0900  . fentaNYL infusion INTRAVENOUS 25 mcg/hr (11/16/20 0900)   . aspirin  81 mg Per Tube Daily  . atorvastatin  40 mg Per Tube q1800  . docusate  100 mg Per Tube BID  . feeding supplement (PROSource TF)  45 mL Per Tube BID  . feeding supplement (VITAL HIGH PROTEIN)  1,000 mL Per Tube Q24H  . heparin  5,000 Units Subcutaneous Q8H  . insulin aspart  0-9 Units Subcutaneous Q4H  . insulin glargine  15 Units Subcutaneous Daily  . mouth rinse  15 mL Mouth Rinse 10 times per day  . [START ON 11/17/2020] midodrine  10 mg Per Tube Q T,Th,Sa-HD  . mirtazapine  7.5 mg Per Tube QHS  . multivitamin  1 tablet Per Tube QHS  . pantoprazole sodium  40 mg Per Tube Daily    OP HD: TTS  [place] (last HD 4/2, prior to that last was 3/24)  4h  350/500  82kg  2/2 bath  P4  AVF  Hep none - Hectoral 23mcg IV q HD  Assessment/Plan: 1. Resp failure acute w/ hypoxia and hypercapnia - per CCM due to aspiration pneumonitis, possible OSA / OHS as well. ECHO showed pulm HTN, HFpEF, mod-severe MS. Vent per CCM. UF as tolerated with HD (hypotension on HD may be related to underlying pHTN/valvular dz/ cor pulm 2. Fevers: per primary, low threshold to start abx 3.  ESRD: maintain TTS scheduled here, next HD tomorrow 4.  HTN - BP's high but drop on HD.  Cautious use of BP lowering meds as needed, may need to hold prior to HD but first will attempts hd again tomorrow 5.  Anemia: no ESA need at this time, hgb at goal 6.  Metabolic bone disease: Ca ok, Phos high - Phoslo resumed, follow labs. 7.  T2DM: per primary 8.  PAD/leg wounds: ischemic/stasis dermatitis per WOC notes 9.  CAD/HFrEF: UF as tolerated 10. Hyponatremia: managing with HD, 137Na bath, UF as tolerated   Gean Quint, MD [facility] 11/16/2020, 10:11 AM

## 2020-11-16 NOTE — Progress Notes (Signed)
Palliative-   Spoke with [name]- earliest he and brother can meet is Wednesday at [place] for Palliative [place] meeting then.   Mariana Kaufman, AGNP-C Palliative Medicine  Please call Palliative Medicine team phone with any questions [phone].  No charge

## 2020-11-17 DIAGNOSIS — N186 End stage renal disease: Secondary | ICD-10-CM | POA: Diagnosis not present

## 2020-11-17 DIAGNOSIS — J9602 Acute respiratory failure with hypercapnia: Secondary | ICD-10-CM | POA: Diagnosis not present

## 2020-11-17 DIAGNOSIS — Z9911 Dependence on respirator [ventilator] status: Secondary | ICD-10-CM | POA: Diagnosis not present

## 2020-11-17 DIAGNOSIS — J9601 Acute respiratory failure with hypoxia: Secondary | ICD-10-CM | POA: Diagnosis not present

## 2020-11-17 LAB — CBC
HCT: 35.6 % — ABNORMAL LOW (ref 36.0–46.0)
Hemoglobin: 11.2 g/dL — ABNORMAL LOW (ref 12.0–15.0)
MCH: 31.2 pg (ref 26.0–34.0)
MCHC: 31.5 g/dL (ref 30.0–36.0)
MCV: 99.2 fL (ref 80.0–100.0)
Platelets: 148 10*3/uL — ABNORMAL LOW (ref 150–400)
RBC: 3.59 MIL/uL — ABNORMAL LOW (ref 3.87–5.11)
RDW: 14.1 % (ref 11.5–15.5)
WBC: 7.2 10*3/uL (ref 4.0–10.5)
nRBC: 0 % (ref 0.0–0.2)

## 2020-11-17 LAB — BASIC METABOLIC PANEL
Anion gap: 12 (ref 5–15)
BUN: 53 mg/dL — ABNORMAL HIGH (ref 8–23)
CO2: 25 mmol/L (ref 22–32)
Calcium: 9.1 mg/dL (ref 8.9–10.3)
Chloride: 95 mmol/L — ABNORMAL LOW (ref 98–111)
Creatinine, Ser: 6.05 mg/dL — ABNORMAL HIGH (ref 0.44–1.00)
GFR, Estimated: 7 mL/min — ABNORMAL LOW (ref >60)
Glucose, Bld: 273 mg/dL — ABNORMAL HIGH (ref 70–99)
Potassium: 3.9 mmol/L (ref 3.5–5.1)
Sodium: 132 mmol/L — ABNORMAL LOW (ref 135–145)

## 2020-11-17 LAB — GLUCOSE, CAPILLARY
Glucose-Capillary: 252 mg/dL — ABNORMAL HIGH (ref 70–99)
Glucose-Capillary: 275 mg/dL — ABNORMAL HIGH (ref 70–99)
Glucose-Capillary: 161 mg/dL — ABNORMAL HIGH (ref 70–99)
Glucose-Capillary: 261 mg/dL — ABNORMAL HIGH (ref 70–99)
Glucose-Capillary: 210 mg/dL — ABNORMAL HIGH (ref 70–99)
Glucose-Capillary: 214 mg/dL — ABNORMAL HIGH (ref 70–99)

## 2020-11-17 LAB — PHOSPHORUS
Phosphorus: <1 mg/dL — CL (ref 2.5–4.6)
Phosphorus: 1.6 mg/dL — ABNORMAL LOW (ref 2.5–4.6)

## 2020-11-17 LAB — MAGNESIUM: Magnesium: 2 mg/dL (ref 1.7–2.4)

## 2020-11-17 MED ORDER — DEXMEDETOMIDINE HCL IN NACL 400 MCG/100ML IV SOLN
0.4000 ug/kg/h | INTRAVENOUS | Status: DC
  Administered 2020-11-17: 0.4 ug/kg/h via INTRAVENOUS
  Administered 2020-11-17 – 2020-11-18 (×2): 0.5 ug/kg/h via INTRAVENOUS
  Filled 2020-11-17 (×3): qty 100

## 2020-11-17 MED ORDER — BISACODYL 10 MG RE SUPP
10.0000 mg | Freq: Once | RECTAL | Status: AC
  Administered 2020-11-17: 10 mg via RECTAL
  Filled 2020-11-17: qty 1

## 2020-11-17 MED ORDER — SODIUM PHOSPHATES 45 MMOLE/15ML IV SOLN
30.0000 mmol | Freq: Once | INTRAVENOUS | Status: AC
  Administered 2020-11-17: 30 mmol via INTRAVENOUS
  Filled 2020-11-17: qty 10

## 2020-11-17 MED ORDER — MIDODRINE HCL 5 MG PO TABS
10.0000 mg | ORAL_TABLET | Freq: Once | ORAL | Status: AC
  Administered 2020-11-17: 10 mg via ORAL

## 2020-11-17 MED ORDER — POTASSIUM PHOSPHATES 15 MMOLE/5ML IV SOLN
45.0000 mmol | Freq: Once | INTRAVENOUS | Status: AC
  Administered 2020-11-17: 45 mmol via INTRAVENOUS
  Filled 2020-11-17: qty 15

## 2020-11-17 NOTE — Progress Notes (Signed)
[place] Progress Note Patient Name: Kathleen [name] DOB: [birth year] MRN: [id number]   Date of Service  11/17/2020  HPI/Events of Note  PHOS  < 1.0, K+ 3.9  eICU Interventions  K+PHOS 45 mmol iv x 1 ordered.        Frederik Pear 11/17/2020, 6:56 AM

## 2020-11-17 NOTE — Progress Notes (Signed)
Inpatient Diabetes Program Recommendations  AACE/ADA: New Consensus Statement on Inpatient Glycemic Control   Target Ranges:  Prepandial:   less than 140 mg/dL      Peak postprandial:   less than 180 mg/dL (1-2 hours)      Critically ill patients:  140 - 180 mg/dL  Results for CELISA, SCHOENBERG (MRN [id number]) as of 11/17/2020 10:26  Ref. Range 11/16/2020 07:26 11/16/2020 10:28 11/16/2020 16:17 11/16/2020 19:17 11/16/2020 23:05 11/17/2020 03:06 11/17/2020 07:31  Glucose-Capillary Latest Ref Range: 70 - 99 mg/dL 236 (H) 218 (H) 278 (H) 281 (H) 260 (H) 252 (H) 275 (H)    Review of Glycemic Control  Diabetes history: DM2 Outpatient Diabetes medications: Basaglar 30 units QHS Current orders for Inpatient glycemic control: Lantus 15 units daily, Novolog 0-9 units Q4H; Vital @ 65 ml/hr  Inpatient Diabetes Program Recommendations:    Insulin: Please consider increasing Lantus to 20 units daily and ordering Novolog 5 units Q4H for tube feeding coverage. If tube feeding is stopped or held then Novolog tube feeding coverage should also be stopped or held.  Thanks, Barnie Alderman, RN, MSN, CDE Diabetes Coordinator Inpatient Diabetes Program [phone] (Team Pager from 8am to 5pm)

## 2020-11-17 NOTE — Progress Notes (Signed)
[facility] Progress Note   Subjective:  Seen in ICU. No acute events overnight. Hypophos overnight (<1), replaced w/ kphos. Afebrile overnight  Objective Vitals:   11/17/20 0400 11/17/20 0500 11/17/20 0600 11/17/20 0700  BP: (!) 146/69 138/72 132/64 (!) 151/107  Pulse: 78 78 74 85  Resp: (!) 22 12 (!) 22 (!) 23  Temp:      TempSrc:      SpO2: 99% 98% 98% 100%  Weight:  87.8 kg    Height:       Physical Exam General:  NAD Heart: RRR Lungs: CTA bl ant, intubated Abdomen: soft, non-tender Extremities: lower legs wrapped Neuro: opening eyes intermittently, sedated, nods heads to some questions Dialysis Access: LUE AVF + bruit  Additional Objective Labs: Basic Metabolic Panel: Recent Labs  Lab 11/15/20 0107 11/15/20 0211 11/16/20 0013 11/17/20 0506  NA 135 134* 132* 132*  K 4.4 3.8 3.8 3.9  CL 96*  --  98 95*  CO2 26  --  27 25  GLUCOSE 84  --  288* 273*  BUN 15  --  34* 53*  CREATININE 4.05*  --  5.14* 6.05*  CALCIUM 9.8  --  8.9 9.1  PHOS 5.1*  --  1.5* <1.0*   Liver Function Tests: Recent Labs  Lab 12/04/2020 1110 11/11/20 0017  AST 23  --   ALT 19  --   ALKPHOS 66  --   BILITOT 0.4  --   PROT 6.8  --   ALBUMIN 3.5 3.0*   Recent Labs  Lab 12/01/2020 1110  LIPASE 33   CBC: Recent Labs  Lab 11/13/2020 1110 11/07/2020 1210 11/12/20 0431 11/14/20 2144 11/15/20 0107 11/15/20 0211 11/16/20 0013 11/17/20 0506  WBC 5.1   < > 5.1 6.6 5.5  --  5.9 7.2  NEUTROABS 3.3  --   --   --   --   --   --   --   HGB 12.6   < > 10.9* 11.0* 12.8 12.6 11.2* 11.2*  HCT 43.3   < > 37.7 39.0 46.2* 37.0 36.3 35.6*  MCV 106.4*   < > 108.6* 109.2* 111.1*  --  99.5 99.2  PLT 193   < > 152 131* 124*  --  135* 148*   < > = values in this interval not displayed.   CBG: Recent Labs  Lab 11/16/20 1028 11/16/20 1617 11/16/20 1917 11/16/20 2305 11/17/20 0306  GLUCAP 218* 278* 281* 260* 252*   Studies/Results: DG CHEST PORT 1 VIEW  Result Date:  11/16/2020 CLINICAL DATA:  Hypoxia EXAM: PORTABLE CHEST 1 VIEW COMPARISON:  November 15, 2020 FINDINGS: Endotracheal tube tip is 3.0 cm above the carina. Nasogastric tube tip and side port are below the diaphragm. No pneumothorax. There are pleural effusions bilaterally with bibasilar atelectasis. There is cardiomegaly with mild pulmonary venous hypertension. No adenopathy. No bone lesions. IMPRESSION: Tube and catheter positions as described without pneumothorax. Cardiomegaly with suspected degree of pulmonary vascular congestion. Pleural effusions bilaterally. Suspect a degree of underlying congestive heart failure. No appreciable airspace consolidation or well-defined pulmonary edema. Electronically Signed   By: Lowella Grip III M.D.   On: 11/16/2020 10:18   Medications: . sodium chloride 10 mL/hr at 11/17/20 0700  . feeding supplement (VITAL AF 1.2 CAL) 1,000 mL (11/17/20 0224)  . fentaNYL infusion INTRAVENOUS 25 mcg/hr (11/17/20 0700)  . potassium PHOSPHATE IVPB (in mmol)     . aspirin  81 mg Per Tube Daily  .  atorvastatin  40 mg Per Tube q1800  . docusate  100 mg Per Tube BID  . heparin  5,000 Units Subcutaneous Q8H  . insulin aspart  0-9 Units Subcutaneous Q4H  . insulin glargine  15 Units Subcutaneous Daily  . mouth rinse  15 mL Mouth Rinse 10 times per day  . midodrine  10 mg Per Tube Q T,Th,Sa-HD  . mirtazapine  7.5 mg Per Tube QHS  . multivitamin  1 tablet Per Tube QHS  . pantoprazole sodium  40 mg Per Tube Daily  . polyethylene glycol  17 g Per Tube Daily    OP HD: TTS [place] (last HD 4/2, prior to that last was 3/24)  4h  350/500  82kg  2/2 bath  P4  AVF  Hep none - Hectoral 65mcg IV q HD  Assessment/Plan: 1. Resp failure acute w/ hypoxia and hypercapnia - per CCM due to aspiration pneumonitis, possible OSA / OHS as well. ECHO showed pulm HTN, HFpEF, mod-severe MS. Vent per CCM. UF as tolerated with HD (hypotension on HD may be related to underlying pHTN/valvular dz/ cor  pulm) 2. Fevers: per primary, low threshold to start abx 3.  ESRD: maintain TTS scheduled here, next HD today 4.  HTN - BP's high but drop on HD.  Cautious use of BP lowering meds as needed, may need to hold prior to HD but first will attempt hd again 5.  Anemia of ESRD: no ESA need at this time, hgb at goal 6.  secondary hyperparathyroidism/Metabolic bone disease: stop binders, see #11 7.  T2DM: per primary 8.  PAD/leg wounds: ischemic/stasis dermatitis per WOC notes 9.  CAD/HFrEF: UF as tolerated 10. Hyponatremia: stable, managing with HD, 137Na bath, UF as tolerated 11. Hypophosphatemia: replete prn, not on any binders at the moment   Gean Quint, MD [facility] 11/17/2020, 7:18 AM

## 2020-11-17 NOTE — Plan of Care (Signed)
Patient has maintained oxygen saturations and other clinical measurements within normal limits during this shift. The patient has maintained her tube feedings and has tolerated the increased rate/formula change since yesterday. Patient has needed less PRN medications to account for signs/symptoms of pain/anxiety. Patient has a bed exit on and is progressing towards remaining free from injury.    Problem: Clinical Measurements: Goal: Ability to maintain clinical measurements within normal limits will improve Outcome: Progressing   Problem: Nutrition: Goal: Adequate nutrition will be maintained Outcome: Progressing   Problem: Coping: Goal: Level of anxiety will decrease Outcome: Progressing   Problem: Pain Managment: Goal: General experience of comfort will improve Outcome: Progressing   Problem: Safety: Goal: Ability to remain free from injury will improve Outcome: Progressing   Patient is still intubated and sedated and unable for this RN to assess the effectiveness/retention of education given to the patient. The patient is still not moving other than every 2 hours turns. The patient has not had a BM with increase in stool softners/laxatives. The patient needs assistance for all movement and has increased risk for impaired skin integrity.    Problem: Education: Goal: Knowledge of General Education information will improve Description: Including pain rating scale, medication(s)/side effects and non-pharmacologic comfort measures Outcome: Not Progressing   Problem: Activity: Goal: Risk for activity intolerance will decrease Outcome: Not Progressing   Problem: Elimination: Goal: Will not experience complications related to bowel motility Outcome: Not Progressing   Problem: Skin Integrity: Goal: Risk for impaired skin integrity will decrease Outcome: Not Progressing

## 2020-11-17 NOTE — Progress Notes (Signed)
[place] Progress Note Patient Name: Kathleen [name] DOB: Feb 12, 1946 MRN: [id number]   Date of Service  11/17/2020  HPI/Events of Note  Hypophosphatemia - PO4--- = 1.6 and Creatinine = 6.05.  eICU Interventions  Pharmacy will enter a replacement order for Sodium Phosphate.      Intervention Category Major Interventions: Electrolyte abnormality - evaluation and management  Caroly Purewal [place] 11/17/2020, 11:30 PM

## 2020-11-17 NOTE — Progress Notes (Signed)
NAME:  Kathleen [name], MRN:  [id number], DOB:  03/08/1946, LOS: 6 ADMISSION DATE:  11/15/2020, CONSULTATION DATE:  4/9 REFERRING MD:  Dr. McDiarmid, CHIEF COMPLAINT:  Pulmonary hypertension  History of Present Illness:  75 year old female with PMH as below, which is significant for ESRD, HFpEF, CVA, DM, CAD and tobacco abuse. She [facility] ED 4/5 with complaints of progressive weakness x 2 week and has missed HD for the entire week prior to presentation. No other specific symptoms at the time of admission. In the emergency department she was found to have hypercarbic respiratory failure and was started on BiPAP. It was felt that with HD she would improve. She largely did improve with HD, despite difficulty using NIMV nocturnally.  Echocardiogram was done as part of the workup and showed markedly elevated pulmonary artery systolic pressure of 09-32%. PCCM was consulted for new diagnosis of pulmonary hypertension.   Later that evening she had vomiting episodes and developed somnolence. She was found to be hypercapneic and was moved to the ICU for intubation and mechanical ventilation.  Pertinent  Medical History   has a past medical history of Acute CVA (cerebrovascular accident) ([place]) (11/08/2017), Acute respiratory failure with hypercapnia ([place]), Acute respiratory failure with hypoxia ([place]) (01/29/2019), Altered mental status (07/14/2017), Anemia, Arthritis, Asthma, Benign neoplasm of ascending colon, Benign neoplasm of cecum, Benign neoplasm of sigmoid colon, BPPV (benign paroxysmal positional vertigo) (01/22/2014), CAD (coronary artery disease), CVA (cerebral vascular accident) ([place]) (11/09/2017), Diabetes mellitus without complication ([place]), Disorientation, ESRD (end stage renal disease) ([place]), Gastritis and gastroduodenitis, Hematochezia, History of echocardiogram, transfusion, Hyperlipidemia, Hypernatremia, Hypertension, Hypertensive urgency (01/21/2014), Meningioma ([place]), Noncompliance with  medication regimen, Obesity, Pleural effusion, left, Pulmonary edema (01/29/2019), Pulmonary hypertension due to sleep-disordered breathing ([place]) (11/14/2020), Pulmonary vascular congestion, Rectal bleeding (09/01/2018), Renal insufficiency, Syncope and collapse (35/12/7320), Systolic and diastolic CHF, acute ([place]), Systolic CHF ([place]), and Tobacco abuse (01/21/2014).   Significant Hospital Events: Including procedures, antibiotic start and stop dates in addition to other pertinent events   . 4/4 admit  . 4/8 Echo LVEF 60-65%. RV systolic function normal. Mod/sev mitral stenosis, mod TR.  . 4/11 remains on vent   Interim History / Subjective:   Patient remains critically ill intubated on mechanical life support. Tolerating intermittent HD  Objective   Blood pressure (!) 169/101, pulse 83, temperature 99.1 F (37.3 C), temperature source Axillary, resp. rate (!) 22, height 5\' 3"  (1.6 m), weight 83.6 kg, SpO2 98 %.    Vent Mode: PRVC FiO2 (%):  [40 %] 40 % Set Rate:  [22 bmp] 22 bmp Vt Set:  [420 mL] 420 mL PEEP:  [5 cmH20] 5 cmH20 Plateau Pressure:  [20 cmH20-24 cmH20] 24 cmH20   Intake/Output Summary (Last 24 hours) at 11/17/2020 1159 Last data filed at 11/17/2020 1126 Gross per 24 hour  Intake 2576.25 ml  Output 3000 ml  Net -423.75 ml   Filed Weights   11/17/20 0500 11/17/20 0720 11/17/20 1126  Weight: 87.8 kg 87.8 kg 83.6 kg    Examination: General: Elderly female, critically ill intubated on life support HENT: NCAT, endotracheal tube in place Lungs: Bilateral mechanically ventilated breaths Cardiovascular: Regular rate rhythm, S1-S2 Abdomen: Obese, soft, nontender, nondistended Extremities: Bilateral legs wrapped Neuro: Sedated  Labs/imaging that I have personally reviewed  (right click and "Reselect all SmartList Selections" daily)  ECHO: LVEF 60-65%. RV systolic function normal. Mod/sev mitral stenosis, mod TR.  Resolved Hospital Problem list     Assessment & Plan:  Acute respiratory failure with hypoxia and hypercapnia requiring MV due to aspiration pneumonitis Low grade fevers P:  Adult mechanical vent protocol VAP prophylaxis PAD guidelines for sedation Attempt SAT SBT once HD is over. I suspect will need to be liberated from the ventilator tomorrow. Mental status currently precludes following commands at this time. Monitoring off antimicrobials  Pulmonary hypertension as identified of transthoracic echocardiogram 4/8. Right ventricular systolic pressure is estimated at 79.6. She has a history concerning for group 2 disease in the setting of known hypertension, HFpEF, and valvular heart disease (mod to severe mitral stenosis). Also concern for group 3 disease considering likely undiagnosed OSA/OHS. I doubt cteph but can get work up in outpatient  Acute on chronic diastolic heart failure  Probable OSA, at risk for OHS Moderate to severe MS HTN P:  Management with HD Will need outpatient PSG, likely referral to Dr. Silas Flood for pH evaluation and management.  Hypoglycemia; h/o DM Tube feeds continued CBGs plus SSI  ESRD, hypervolemia from dialysis noncompliance -IHD per nephrology  History of CVA/ CAD -Aspirin plus statin   Best practice (right click and "Reselect all SmartList Selections" daily)  Diet:  Tube Feed  Pain/Anxiety/Delirium protocol (if indicated): Yes (RASS goal 0) VAP protocol (if indicated): Yes DVT prophylaxis: Subcutaneous Heparin GI prophylaxis: N/A and PPI Glucose control:  SSI Yes Central venous access:  N/A Arterial line:  N/A Foley:  N/A Mobility:  bed rest  PT consulted: N/A Last date of multidisciplinary goals of care discussion [ ]  Code Status:  full code Disposition: ICU  Labs   CBC: Recent Labs  Lab 11/12/20 0431 11/14/20 2144 11/15/20 0107 11/15/20 0211 11/16/20 0013 11/17/20 0506  WBC 5.1 6.6 5.5  --  5.9 7.2  HGB 10.9* 11.0* 12.8 12.6 11.2* 11.2*  HCT 37.7 39.0 46.2* 37.0 36.3 35.6*   MCV 108.6* 109.2* 111.1*  --  99.5 99.2  PLT 152 131* 124*  --  135* 148*    Basic Metabolic Panel: Recent Labs  Lab 11/11/20 0017 11/11/20 1037 11/13/20 0103 11/14/20 2144 11/15/20 0107 11/15/20 0211 11/16/20 0013 11/17/20 0506  NA 139   < > 137 135 135 134* 132* 132*  K 6.2*   < > 4.2 3.9 4.4 3.8 3.8 3.9  CL 100   < > 98 97* 96*  --  98 95*  CO2 27   < > 26 27 26   --  27 25  GLUCOSE 93   < > 94 75 84  --  288* 273*  BUN 57*   < > 11 14 15   --  34* 53*  CREATININE 9.05*   < > 3.69* 4.04* 4.05*  --  5.14* 6.05*  CALCIUM 8.7*   < > 8.2* 9.2 9.8  --  8.9 9.1  MG  --   --   --   --  2.2  --  1.9 2.0  PHOS 8.3*  --   --   --  5.1*  --  1.5* <1.0*   < > = values in this interval not displayed.   GFR: Estimated Creatinine Clearance: 8.1 mL/min (A) (by C-G formula based on SCr of 6.05 mg/dL (H)). Recent Labs  Lab 11/11/20 1522 11/12/20 0431 11/14/20 2144 11/15/20 0107 11/15/20 0108 11/16/20 0013 11/17/20 0506  WBC  --    < > 6.6 5.5  --  5.9 7.2  LATICACIDVEN 0.9  --   --   --  1.0  --   --    < > =  values in this interval not displayed.    This patient is critically ill with multiple organ system failure; which, requires frequent high complexity decision making, assessment, support, evaluation, and titration of therapies. This was completed through the application of advanced monitoring technologies and extensive interpretation of multiple databases. During this encounter critical care time was devoted to patient care services described in this note for 32 minutes.  [facility] 11/17/2020 12:00 PM

## 2020-11-18 ENCOUNTER — Inpatient Hospital Stay (HOSPITAL_COMMUNITY): Payer: Medicare HMO

## 2020-11-18 DIAGNOSIS — Z9911 Dependence on respirator [ventilator] status: Secondary | ICD-10-CM | POA: Diagnosis not present

## 2020-11-18 DIAGNOSIS — Z515 Encounter for palliative care: Secondary | ICD-10-CM

## 2020-11-18 DIAGNOSIS — J9602 Acute respiratory failure with hypercapnia: Secondary | ICD-10-CM | POA: Diagnosis not present

## 2020-11-18 DIAGNOSIS — N186 End stage renal disease: Secondary | ICD-10-CM | POA: Diagnosis not present

## 2020-11-18 DIAGNOSIS — Z7189 Other specified counseling: Secondary | ICD-10-CM | POA: Diagnosis not present

## 2020-11-18 DIAGNOSIS — I050 Rheumatic mitral stenosis: Secondary | ICD-10-CM | POA: Diagnosis not present

## 2020-11-18 DIAGNOSIS — J9601 Acute respiratory failure with hypoxia: Secondary | ICD-10-CM | POA: Diagnosis not present

## 2020-11-18 DIAGNOSIS — Z978 Presence of other specified devices: Secondary | ICD-10-CM | POA: Diagnosis not present

## 2020-11-18 LAB — BLOOD GAS, ARTERIAL
Acid-Base Excess: 3.9 mmol/L — ABNORMAL HIGH (ref 0.0–2.0)
Bicarbonate: 27.3 mmol/L (ref 20.0–28.0)
FIO2: 40
O2 Saturation: 95.1 %
Patient temperature: 37
pCO2 arterial: 37.1 mmHg (ref 32.0–48.0)
pH, Arterial: 7.48 — ABNORMAL HIGH (ref 7.350–7.450)
pO2, Arterial: 68.3 mmHg — ABNORMAL LOW (ref 83.0–108.0)

## 2020-11-18 LAB — ALBUMIN: Albumin: 2.5 g/dL — ABNORMAL LOW (ref 3.5–5.0)

## 2020-11-18 LAB — CBC
HCT: 32.6 % — ABNORMAL LOW (ref 36.0–46.0)
Hemoglobin: 10.3 g/dL — ABNORMAL LOW (ref 12.0–15.0)
MCH: 30.7 pg (ref 26.0–34.0)
MCHC: 31.6 g/dL (ref 30.0–36.0)
MCV: 97.3 fL (ref 80.0–100.0)
Platelets: 149 10*3/uL — ABNORMAL LOW (ref 150–400)
RBC: 3.35 MIL/uL — ABNORMAL LOW (ref 3.87–5.11)
RDW: 14.4 % (ref 11.5–15.5)
WBC: 9.1 10*3/uL (ref 4.0–10.5)
nRBC: 0 % (ref 0.0–0.2)

## 2020-11-18 LAB — BASIC METABOLIC PANEL
Anion gap: 10 (ref 5–15)
BUN: 34 mg/dL — ABNORMAL HIGH (ref 8–23)
CO2: 27 mmol/L (ref 22–32)
Calcium: 7.9 mg/dL — ABNORMAL LOW (ref 8.9–10.3)
Chloride: 98 mmol/L (ref 98–111)
Creatinine, Ser: 3.85 mg/dL — ABNORMAL HIGH (ref 0.44–1.00)
GFR, Estimated: 12 mL/min — ABNORMAL LOW (ref >60)
Glucose, Bld: 257 mg/dL — ABNORMAL HIGH (ref 70–99)
Potassium: 4.2 mmol/L (ref 3.5–5.1)
Sodium: 135 mmol/L (ref 135–145)

## 2020-11-18 LAB — PHOSPHORUS: Phosphorus: 3.4 mg/dL (ref 2.5–4.6)

## 2020-11-18 LAB — MAGNESIUM: Magnesium: 1.7 mg/dL (ref 1.7–2.4)

## 2020-11-18 LAB — GLUCOSE, CAPILLARY
Glucose-Capillary: 246 mg/dL — ABNORMAL HIGH (ref 70–99)
Glucose-Capillary: 234 mg/dL — ABNORMAL HIGH (ref 70–99)
Glucose-Capillary: 157 mg/dL — ABNORMAL HIGH (ref 70–99)
Glucose-Capillary: 49 mg/dL — ABNORMAL LOW (ref 70–99)
Glucose-Capillary: 110 mg/dL — ABNORMAL HIGH (ref 70–99)
Glucose-Capillary: 110 mg/dL — ABNORMAL HIGH (ref 70–99)
Glucose-Capillary: 140 mg/dL — ABNORMAL HIGH (ref 70–99)

## 2020-11-18 LAB — PROCALCITONIN: Procalcitonin: 2.95 ng/mL

## 2020-11-18 MED ORDER — DEXTROSE 50 % IV SOLN: 25.0000 g | INTRAVENOUS | Status: AC

## 2020-11-18 MED ORDER — ETOMIDATE 2 MG/ML IV SOLN: 20.0000 mg | Freq: Once | INTRAVENOUS | Status: AC

## 2020-11-18 MED ORDER — INSULIN ASPART 100 UNIT/ML ~~LOC~~ SOLN
0.0000 [IU] | SUBCUTANEOUS | Status: DC
  Administered 2020-11-19: 3 [IU] via SUBCUTANEOUS
  Administered 2020-11-19 (×3): 4 [IU] via SUBCUTANEOUS
  Administered 2020-11-19: 3 [IU] via SUBCUTANEOUS
  Administered 2020-11-20 (×2): 4 [IU] via SUBCUTANEOUS
  Administered 2020-11-20: 3 [IU] via SUBCUTANEOUS

## 2020-11-18 MED ORDER — KETAMINE HCL 50 MG/5ML IJ SOSY
PREFILLED_SYRINGE | INTRAMUSCULAR | Status: AC
  Filled 2020-11-18: qty 5

## 2020-11-18 MED ORDER — INSULIN ASPART 100 UNIT/ML ~~LOC~~ SOLN: 4.0000 [IU] | SUBCUTANEOUS | Status: DC

## 2020-11-18 MED ORDER — FENTANYL CITRATE (PF) 100 MCG/2ML IJ SOLN: 100.0000 ug | Freq: Once | INTRAMUSCULAR | Status: AC

## 2020-11-18 MED ORDER — FENTANYL CITRATE (PF) 100 MCG/2ML IJ SOLN
INTRAMUSCULAR | Status: AC
  Administered 2020-11-18: 100 ug via INTRAVENOUS
  Filled 2020-11-18: qty 2

## 2020-11-18 MED ORDER — FENTANYL CITRATE (PF) 100 MCG/2ML IJ SOLN
25.0000 ug | INTRAMUSCULAR | Status: DC | PRN
  Administered 2020-11-19 – 2020-11-20 (×6): 100 ug via INTRAVENOUS
  Filled 2020-11-18 (×6): qty 2

## 2020-11-18 MED ORDER — INSULIN GLARGINE 100 UNIT/ML ~~LOC~~ SOLN
20.0000 [IU] | Freq: Every day | SUBCUTANEOUS | Status: DC
  Filled 2020-11-18: qty 0.2

## 2020-11-18 MED ORDER — DOCUSATE SODIUM 50 MG/5ML PO LIQD
100.0000 mg | Freq: Two times a day (BID) | ORAL | Status: DC
  Administered 2020-11-23 – 2020-11-24 (×3): 100 mg
  Filled 2020-11-18 (×4): qty 10

## 2020-11-18 MED ORDER — INSULIN ASPART 100 UNIT/ML ~~LOC~~ SOLN
0.0000 [IU] | SUBCUTANEOUS | Status: DC
  Administered 2020-11-18: 4 [IU] via SUBCUTANEOUS
  Administered 2020-11-18: 7 [IU] via SUBCUTANEOUS

## 2020-11-18 MED ORDER — IPRATROPIUM-ALBUTEROL 0.5-2.5 (3) MG/3ML IN SOLN
3.0000 mL | RESPIRATORY_TRACT | Status: AC
  Administered 2020-11-18 (×2): 3 mL via RESPIRATORY_TRACT
  Filled 2020-11-18 (×2): qty 3

## 2020-11-18 MED ORDER — IPRATROPIUM-ALBUTEROL 0.5-2.5 (3) MG/3ML IN SOLN: 3.0000 mL | RESPIRATORY_TRACT | Status: DC

## 2020-11-18 MED ORDER — FENTANYL CITRATE (PF) 100 MCG/2ML IJ SOLN
25.0000 ug | INTRAMUSCULAR | Status: DC | PRN
Start: 2020-11-18 — End: 2020-11-21
  Administered 2020-11-19: 25 ug via INTRAVENOUS
  Filled 2020-11-18 (×2): qty 2

## 2020-11-18 MED ORDER — ETOMIDATE 2 MG/ML IV SOLN
INTRAVENOUS | Status: AC
  Administered 2020-11-18: 20 mg via INTRAVENOUS
  Filled 2020-11-18: qty 20

## 2020-11-18 MED ORDER — NOREPINEPHRINE 4 MG/250ML-% IV SOLN
INTRAVENOUS | Status: AC
  Administered 2020-11-18: 2 ug/min via INTRAVENOUS
  Filled 2020-11-18: qty 250

## 2020-11-18 MED ORDER — MIDAZOLAM HCL 2 MG/2ML IJ SOLN: 2.0000 mg | Freq: Once | INTRAMUSCULAR | Status: AC

## 2020-11-18 MED ORDER — DEXTROSE 50 % IV SOLN
INTRAVENOUS | Status: AC
  Administered 2020-11-18: 25 g via INTRAVENOUS
  Filled 2020-11-18: qty 50

## 2020-11-18 MED ORDER — PROSOURCE TF PO LIQD
45.0000 mL | Freq: Two times a day (BID) | ORAL | Status: DC
  Administered 2020-11-18: 45 mL
  Filled 2020-11-18 (×2): qty 45

## 2020-11-18 MED ORDER — VITAL HIGH PROTEIN PO LIQD
1000.0000 mL | ORAL | Status: DC
  Administered 2020-11-18: 1000 mL

## 2020-11-18 MED ORDER — ROCURONIUM BROMIDE 50 MG/5ML IV SOLN
50.0000 mg | Freq: Once | INTRAVENOUS | Status: AC
  Filled 2020-11-18: qty 5

## 2020-11-18 MED ORDER — MIDAZOLAM HCL 2 MG/2ML IJ SOLN
INTRAMUSCULAR | Status: AC
  Administered 2020-11-18: 2 mg via INTRAVENOUS
  Filled 2020-11-18: qty 2

## 2020-11-18 MED ORDER — INSULIN GLARGINE 100 UNIT/ML ~~LOC~~ SOLN
15.0000 [IU] | Freq: Every day | SUBCUTANEOUS | Status: DC
  Administered 2020-11-18 – 2020-11-19 (×2): 15 [IU] via SUBCUTANEOUS
  Filled 2020-11-18 (×3): qty 0.15

## 2020-11-18 MED ORDER — NOREPINEPHRINE 4 MG/250ML-% IV SOLN: 0.0000 ug/min | INTRAVENOUS | Status: DC

## 2020-11-18 MED ORDER — DEXMEDETOMIDINE HCL IN NACL 400 MCG/100ML IV SOLN
0.0000 ug/kg/h | INTRAVENOUS | Status: DC
  Administered 2020-11-18 – 2020-11-19 (×2): 0.4 ug/kg/h via INTRAVENOUS
  Administered 2020-11-19: 1.2 ug/kg/h via INTRAVENOUS
  Administered 2020-11-19: 0.5 ug/kg/h via INTRAVENOUS
  Administered 2020-11-20: 1 ug/kg/h via INTRAVENOUS
  Filled 2020-11-18 (×5): qty 100

## 2020-11-18 MED ORDER — ROCURONIUM BROMIDE 10 MG/ML (PF) SYRINGE
PREFILLED_SYRINGE | INTRAVENOUS | Status: AC
  Administered 2020-11-18: 100 mg
  Filled 2020-11-18: qty 10

## 2020-11-18 NOTE — Procedures (Signed)
Intubation Procedure Note  Kathleen [name]  [id number]  [birth year]  Date:11/18/20  Time:4:33 PM   Provider Performing: Ducre L Cyler Kappes    Procedure: Intubation (31500)  Indication(s) Respiratory Failure  Consent Unable to obtain consent due to emergent nature of procedure.   Anesthesia Etomidate, Versed, Fentanyl and Rocuronium   Time Out Verified patient identification, verified procedure, site/side was marked, verified correct patient position, special equipment/implants available, medications/allergies/relevant history reviewed, required imaging and test results available.   Sterile Technique Usual hand hygeine, masks, and gloves were used   Procedure Description Patient positioned in bed supine.  Sedation given as noted above.  Patient was intubated with endotracheal tube using Glidescope.  View was Grade 1 full glottis .  Number of attempts was 1.  Colorimetric CO2 detector was consistent with tracheal placement.   Complications/Tolerance None; patient tolerated the procedure well. Chest X-ray is ordered to verify placement.   EBL Minimal   Specimen(s) None

## 2020-11-18 NOTE — Progress Notes (Signed)
Pt extubated and placed on 5L nasal cannula. RN and DO are both at bedside. Pt is able to talk. She does have a strong cough and is getting up lots of secretions.

## 2020-11-18 NOTE — Progress Notes (Signed)
75 yo F -- extubated 4/13, required reintubation this afternoon. Hypotensive following reintubaiton-- most likely r/t RSI meds  Will order NE for BP augmentation- goal MAP >65, anticipate this will be a very short-term need and likely will be able to dc this evening.   Eliseo Gum MSN, AGACNP-BC [facility] 11/18/2020, 4:38 PM

## 2020-11-18 NOTE — Progress Notes (Signed)
Daily Progress Note   Patient Name: Kathleen [name]       Date: 11/18/2020 DOB: Sep 29, 1945  Age: 75 y.o. MRN#: [id number] Attending Physician: Garner Nash, DO Primary Care Physician: Benito Mccreedy, MD Admit Date: 11/29/2020  Reason for Consultation/Follow-up: Establishing goals of care  Subjective: Met with patient's sons, [name] and [name] and daughter, [name].  They shared that [name] is known to be funny, have a big personality and values doing life the way she wants to do it.  Prior to admission she was somewhat immobile- used a walker. Her sleep cycle tended to be confused with sleeping more during the day and being more awake at night. This sometimes lead to her missing her early morning dialysis appointment.  She found joy in spending time with her family, her grandchildren, food. Recently she had told her family that she wanted to be around for several more years so she could watch her grandchildren grow and see how they turn out. They note that recently- over the last month- she would sometimes be full of energy and wanting to do what is necessary to prolong her health and sustain her life- but she also had times where she seemed very sick and tired.  We discussed her current illness and possible trajectories.  All agreed that if despite current measures and efforts to sustain her- something catastrophic happened and her heart stop beating- then she would not want to be resuscitated. They are in agreement with Do Not Resuscitate order.  Family remains hopeful that [name] will be able to liberate from the ventilator. However, if she is not- she would not want to be prolonged artificially with a trach or a PEG.  We discussed the complications and barriers that [name] might  face in restoring her to her previous quality of life and the necessity that we may have to revisit her goals of care if she fails to improve or shows decline.   Review of Systems  Unable to perform ROS: Intubated    Length of Stay: 7  Current Medications: Scheduled Meds:  . aspirin  81 mg Per Tube Daily  . atorvastatin  40 mg Per Tube q1800  . docusate  100 mg Per Tube BID  . heparin  5,000 Units Subcutaneous Q8H  . insulin aspart  0-20 Units Subcutaneous Q4H  . insulin glargine  15 Units Subcutaneous Daily  . mouth rinse  15 mL Mouth Rinse 10 times per day  . midodrine  10 mg Per Tube Q T,Th,Sa-HD  . mirtazapine  7.5 mg Per Tube QHS  . multivitamin  1 tablet Per Tube QHS  . pantoprazole sodium  40 mg Per Tube Daily  . polyethylene glycol  17 g Per Tube Daily    Continuous Infusions: . sodium chloride 10 mL/hr at 11/18/20 1000  . dexmedetomidine (PRECEDEX) IV infusion Stopped (11/18/20 0277)  . feeding supplement (VITAL AF 1.2 CAL) 1,000 mL (11/17/20 1844)  . fentaNYL infusion INTRAVENOUS Stopped (11/17/20 1313)    PRN Meds: acetaminophen **OR** acetaminophen, fentaNYL, fentaNYL (SUBLIMAZE) injection, fentaNYL (SUBLIMAZE) injection, hydrALAZINE  Physical Exam Vitals and nursing note reviewed.  Constitutional:      Appearance: She is ill-appearing.  Pulmonary:     Comments: intubated Neurological:     Comments: Does not respond to my voice or touch             Vital Signs: BP (!) 128/57 (BP Location: Right Wrist)   Pulse 62   Temp 99.8 F (37.7 C) (Oral)   Resp (!) 23   Ht '5\' 3"'  (1.6 m)   Wt 86.8 kg   LMP  (LMP Unknown)   SpO2 93%   BMI 33.90 kg/m  SpO2: SpO2: 93 % O2 Device: O2 Device: Ventilator O2 Flow Rate: O2 Flow Rate (L/min): 6 L/min  Intake/output summary:   Intake/Output Summary (Last 24 hours) at 11/18/2020 1102 Last data filed at 11/18/2020 1000 Gross per 24 hour  Intake 2576.44 ml  Output 3000 ml  Net -423.56 ml   LBM: Last BM Date:  11/18/20 Baseline Weight: Weight: 88.7 kg Most recent weight: Weight: 86.8 kg       Palliative Assessment/Data: PPS: 10%      Patient Active Problem List   Diagnosis Date Noted  . Possible Pulmonary hypertension ([place]) 11/14/2020  . Mitral stenosis 11/14/2020  . Diastolic dysfunction 41/28/7867  . Acute hypercapnic respiratory failure ([place]) 11/14/2020  . Balance problems 11/13/2020  . Muscle weakness (generalized) 11/13/2020  . History of CVA (cerebrovascular accident) 11/13/2020  . Encounter for assessment of decision-making capacity 11/12/2020  . Agitation   . Chronic respiratory failure with hypercapnia ([place])   . Weakness   . ESRD (end stage renal disease) ([place]) 11/22/2020  . DM (diabetes mellitus), type 2 with renal complications ([place]) 67/20/9470  . Peripheral arterial disease ([place]) 02/23/2016  . Anemia in chronic kidney disease 12/18/2015  . Somnolence   . Hypertension associated with diabetes ([place])   . Unspecified vitamin D deficiency   . Chronic combined systolic and diastolic heart failure ([place]) 02/12/2014  . CAD (coronary artery disease)   . Obesity   . Meningioma ([place])   . Hyperlipidemia associated with type 2 diabetes mellitus ([place]) 01/22/2014    Palliative Care Assessment & Plan   Patient Profile: 75 y.o. female  with past medical history of on hemodialysis Tuesday Thursday Saturday, CHF with EF approximately 60% also with mitral stenosis, PAD, DM, CAD, HLD, HTN, admitted on 11/11/2020 with increasing weakness and nausea, reported she had missed dialysis for 2 weeks due to not feeling like going.  Admission has been complicated with respiratory acidosis, hypercapnea, she did not tolerate BiPAP-pulling it off.  On April 7 she had worsening confusion.  April 9 it was noted she likely has pulmonary hypertension she had worsening lethargy and  vomiting and required intubation.  Continues to be ventilated with likely aspiration pneumonitis due to aspiration event.  She was  hypotensive during dialysis.  Palliative medicine consulted for goals of care.    Assessment/Recommendations/Plan  Tolerating dialysis Sedation lifted- but slow to wake up, requiring a lot of stimulation during SBT's per nursing GOC- continue current efforts- no trach, no PEG, DNR  Goals of Care and Additional Recommendations: Limitations on Scope of Treatment: Full Scope Treatment  Code Status: DNR  Prognosis:  Unable to determine  Discharge Planning: To Be Determined  Care plan was discussed with patient's family and care team.   Thank you for allowing the Palliative Medicine Team to assist in the care of this patient.   Total time: 68 minutes Greater than 50%  of this time was spent counseling and coordinating care related to the above assessment and plan.  Mariana Kaufman, AGNP-C Palliative Medicine   Please contact Palliative Medicine Team phone at [phone] for questions and concerns.

## 2020-11-18 NOTE — Progress Notes (Signed)
Inpatient Diabetes Program Recommendations  AACE/ADA: New Consensus Statement on Inpatient Glycemic Control   Target Ranges:  Prepandial:   less than 140 mg/dL      Peak postprandial:   less than 180 mg/dL (1-2 hours)      Critically ill patients:  140 - 180 mg/dL   Results for CATIE, CHIAO (MRN [id number]) as of 11/18/2020 10:36  Ref. Range 11/17/2020 07:31 11/17/2020 11:32 11/17/2020 15:20 11/17/2020 19:05 11/17/2020 23:11 11/18/2020 03:22 11/18/2020 07:25  Glucose-Capillary Latest Ref Range: 70 - 99 mg/dL 275 (H) 161 (H) 261 (H) 210 (H) 214 (H) 246 (H) 234 (H)   Review of Glycemic Control  Diabetes history: DM2 Outpatient Diabetes medications: Basaglar 30 units QHS Current orders for Inpatient glycemic control: Lantus 15 units daily, Novolog 0-20 units Q4H; Vital @ 65 ml/hr  Inpatient Diabetes Program Recommendations:    Insulin: Please consider increasing Lantus to 20 units daily and ordering Novolog 5 units Q4H for tube feeding coverage. If tube feeding is stopped or held then Novolog tube feeding coverage should also be stopped or held.  Thanks, Barnie Alderman, RN, MSN, CDE Diabetes Coordinator Inpatient Diabetes Program [phone] (Team Pager from 8am to 5pm)

## 2020-11-18 NOTE — Progress Notes (Signed)
Palliative- Additional encounter time face to face-  Per Dr. Sharon Seller plan is for extubation today. Discussed with [name] and Ms. Fortenberry.  If she is extubated and her respiratory status declines- she would want to be reintubated.   Mariana Kaufman, AGNP-C Palliative Medicine  Total additional time: 38 mins

## 2020-11-18 NOTE — Progress Notes (Signed)
This RN entered the patient room around 1530 to find the patient with a lot of secretions and sounding very congested/wheezing. The patient's oxygen saturations were also in the mid to low 80's on 6L [place]. This RN placed the patient on non-rebreather and notified Seawell, MD and respiratory of the change in patient's ability to manage her secretions around 1559 after patient was stabilized on the non-rebreather with oxygen saturations of 99%.   Respiratory at bedside at approximately 1605. Respiratory attempted to give breathing treatment and start patient on BiPAP but patient was not tolerating BiPAP and was still having copious amounts of secretions she was unable to clear. Respiratory and this RN notified Seawell, MD of this. Dr. Valeta Harms was also notified and came to the bedside to re-intubate the patient at approximately 1625.

## 2020-11-18 NOTE — Progress Notes (Addendum)
NAME:  Kathleen [name], MRN:  [id number], DOB:  18-Dec-1945, LOS: 7 ADMISSION DATE:  11/08/2020, CONSULTATION DATE:  4/9 REFERRING MD:  Dr. McDiarmid, CHIEF COMPLAINT:  Pulmonary hypertension  History of Present Illness:  75 year old female with PMH as below, which is significant for ESRD, HFpEF, CVA, DM, CAD and tobacco abuse. She presented to [facility] ED 4/5 with complaints of progressive weakness x 2 week and has missed HD for the entire week prior to presentation. No other specific symptoms at the time of admission. In the emergency department she was found to have hypercarbic respiratory failure and was started on BiPAP. It was felt that with HD she would improve. She largely did improve with HD, despite difficulty using NIMV nocturnally.  Echocardiogram was done as part of the workup and showed markedly elevated pulmonary artery systolic pressure of 72-53%. PCCM was consulted for new diagnosis of pulmonary hypertension.   Later that evening she had vomiting episodes and developed somnolence. She was found to be hypercapneic and was moved to the ICU for intubation and mechanical ventilation.  Pertinent  Medical History   has a past medical history of Acute CVA (cerebrovascular accident) ([place]) (11/08/2017), Acute respiratory failure with hypercapnia ([place]), Acute respiratory failure with hypoxia ([place]) (01/29/2019), Altered mental status (07/14/2017), Anemia, Arthritis, Asthma, Benign neoplasm of ascending colon, Benign neoplasm of cecum, Benign neoplasm of sigmoid colon, BPPV (benign paroxysmal positional vertigo) (01/22/2014), CAD (coronary artery disease), CVA (cerebral vascular accident) ([place]) (11/09/2017), Diabetes mellitus without complication ([place]), Disorientation, ESRD (end stage renal disease) ([place]), Gastritis and gastroduodenitis, Hematochezia, History of echocardiogram, transfusion, Hyperlipidemia, Hypernatremia, Hypertension, Hypertensive urgency (01/21/2014), Meningioma ([place]), Noncompliance with  medication regimen, Obesity, Pleural effusion, left, Pulmonary edema (01/29/2019), Pulmonary hypertension due to sleep-disordered breathing ([place]) (11/14/2020), Pulmonary vascular congestion, Rectal bleeding (09/01/2018), Renal insufficiency, Syncope and collapse (66/11/4032), Systolic and diastolic CHF, acute ([place]), Systolic CHF ([place]), and Tobacco abuse (01/21/2014).   Significant Hospital Events: Including procedures, antibiotic start and stop dates in addition to other pertinent events   . 4/4 admit  . 4/8 Echo LVEF 60-65%. RV systolic function normal. Mod/sev mitral stenosis, mod TR.  . 4/11 remains on vent   Interim History / Subjective:  Responds to voice, following commands. Still on sedation.   Objective   Blood pressure (!) 131/54, pulse 66, temperature (!) 101.6 F (38.7 C), temperature source Oral, resp. rate 16, height 5\' 3"  (1.6 m), weight 86.8 kg, SpO2 97 %.    Vent Mode: PRVC FiO2 (%):  [40 %] 40 % Set Rate:  [22 bmp] 22 bmp Vt Set:  [420 mL] 420 mL PEEP:  [5 cmH20] 5 cmH20 Plateau Pressure:  [19 cmH20-24 cmH20] 20 cmH20   Intake/Output Summary (Last 24 hours) at 11/18/2020 0704 Last data filed at 11/18/2020 0700 Gross per 24 hour  Intake 3147.93 ml  Output 3000 ml  Net 147.93 ml   Filed Weights   11/17/20 0720 11/17/20 1126 11/18/20 0400  Weight: 87.8 kg 83.6 kg 86.8 kg    Examination:  General: Elderly female, critically ill intubated on life support HENT: NCAT, endotracheal tube in place Lungs: Bilateral mechanically ventilated breaths, CTA bilaterally  Cardiovascular: Regular rate rhythm, S1-S2 Abdomen: Obese, soft, nontender, nondistended Extremities: Bilateral legs wrapped Neuro: Sedated  Labs/imaging that I have personally reviewed  (right click and "Reselect all SmartList Selections" daily)  ECHO: LVEF 60-65%. RV systolic function normal. Mod/sev mitral stenosis, mod TR.  Resolved Hospital Problem list     Assessment & Plan:  Acute respiratory  failure with hypoxia and hypercapnia requiring MV due to aspiration pneumonitis. Fever 101.6 overnight. No leukocytosis.  P: - Monitoring off antimicrobials, blood cultures negative, check procal - cont. Adult mechanical vent protocol - VAP prophylaxis - PAD guidelines for sedation - Attempt SAT SBT today   Pulmonary hypertension as identified of transthoracic echocardiogram 4/8. Right ventricular systolic pressure is estimated at 79.6. She has a history concerning for group 2 disease in the setting of known hypertension, HFpEF, and valvular heart disease (mod to severe mitral stenosis). Also concern for group 3 disease considering likely undiagnosed OSA/OHS. I doubt cteph but can get work up in outpatient  Acute on chronic diastolic heart failure  Probable OSA, at risk for OHS Moderate to severe MS HTN P:  Management with HD Will need outpatient PSG, likely referral to Dr. Silas Flood for pH evaluation and management.  TIIDM Tube feeds continued Increase SSI, keep lantus 15U, may be extubated today  ESRD, hypervolemia from dialysis noncompliance Hypocalcemia Hypophosphatemia - repleted  -IHD per nephrology -check albumin, ionized Ca  History of CVA/ CAD -Aspirin plus statin  Constipation - resolved    Best practice (right click and "Reselect all SmartList Selections" daily)  Diet:  Tube Feed  Pain/Anxiety/Delirium protocol (if indicated): Yes (RASS goal 0) VAP protocol (if indicated): Yes DVT prophylaxis: Subcutaneous Heparin GI prophylaxis: N/A and PPI Glucose control:  SSI Yes Central venous access:  N/A Arterial line:  N/A Foley:  N/A Mobility:  bed rest  PT consulted: N/A Last date of multidisciplinary goals of care discussion: daughter updated 4/13 Code Status:  full code Disposition: ICU  Labs   CBC: Recent Labs  Lab 11/14/20 2144 11/15/20 0107 11/15/20 0211 11/16/20 0013 11/17/20 0506 11/18/20 0445  WBC 6.6 5.5  --  5.9 7.2 9.1  HGB 11.0* 12.8 12.6  11.2* 11.2* 10.3*  HCT 39.0 46.2* 37.0 36.3 35.6* 32.6*  MCV 109.2* 111.1*  --  99.5 99.2 97.3  PLT 131* 124*  --  135* 148* 149*    Basic Metabolic Panel: Recent Labs  Lab 11/14/20 2144 11/15/20 0107 11/15/20 0211 11/16/20 0013 11/17/20 0506 11/17/20 2106 11/18/20 0445  NA 135 135 134* 132* 132*  --  135  K 3.9 4.4 3.8 3.8 3.9  --  4.2  CL 97* 96*  --  98 95*  --  98  CO2 27 26  --  27 25  --  27  GLUCOSE 75 84  --  288* 273*  --  257*  BUN 14 15  --  34* 53*  --  34*  CREATININE 4.04* 4.05*  --  5.14* 6.05*  --  3.85*  CALCIUM 9.2 9.8  --  8.9 9.1  --  7.9*  MG  --  2.2  --  1.9 2.0  --  1.7  PHOS  --  5.1*  --  1.5* <1.0* 1.6* 3.4   GFR: Estimated Creatinine Clearance: 13 mL/min (A) (by C-G formula based on SCr of 3.85 mg/dL (H)). Recent Labs  Lab 11/11/20 1522 11/12/20 0431 11/15/20 0107 11/15/20 0108 11/16/20 0013 11/17/20 0506 11/18/20 0445  WBC  --    < > 5.5  --  5.9 7.2 9.1  LATICACIDVEN 0.9  --   --  1.0  --   --   --    < > = values in this interval not displayed.   Marty Heck, DO 11/18/2020, 7:04 AM Pager: [phone]    PCCM:   75 yo  FM, acute on chronic diastolic heart failure, acute respiratory failure, ESRD on iHD. Tolerated HD.   BP (!) 121/58 (BP Location: Right Wrist)   Pulse 85   Temp 99.3 F (37.4 C) (Oral)   Resp (!) 25   Ht 5\' 3"  (1.6 m)   Wt 86.8 kg   LMP  (LMP Unknown)   SpO2 98%   BMI 33.90 kg/m   Gen: obese elderly fm, intubated on life support  HENT: ETT in place Heart: RRR s1 s2  Lungs: bilateral vented breaths   Labs: reviewed   A:  AHRF Pulmonary hypertension  Acute on chronic diastolic heart failure  Probable OSA  HTN  Hypoglycemia  ESRD  P: SAT SBT today  Adult mechanical vent support, switched to PS/CPAP VAP ppx PAD guideline sedation, holding precedex  CBGs with SSI  iHD per nephrology  Appreciate palliative care input   This patient is critically ill with multiple organ system failure;  which, requires frequent high complexity decision making, assessment, support, evaluation, and titration of therapies. This was completed through the application of advanced monitoring technologies and extensive interpretation of multiple databases. During this encounter critical care time was devoted to patient care services described in this note for 33 minutes.   Garner Nash, DO [facility] 11/18/2020 2:09 PM

## 2020-11-18 NOTE — Progress Notes (Signed)
[facility] Progress Note   Subjective:  Seen in ICU. hypophos persistent, being replaced. S/p HD yesterday, net uf 3L. Febrile overnight 101.6  Objective Vitals:   11/18/20 0600 11/18/20 0700 11/18/20 0727 11/18/20 0800  BP: (!) 126/55 (!) 131/54  (!) 136/54  Pulse: 63 66  62  Resp: (!) 22 16  (!) 22  Temp:   99.8 F (37.7 C)   TempSrc:   Oral   SpO2: 97% 97%  96%  Weight:      Height:       Physical Exam General:  sedated Heart: RRR Lungs: CTA bl ant, intubated Abdomen: soft, non-tender Extremities: lower legs wrapped Neuro: sedated Dialysis Access: LUE AVF + bruit  Additional Objective Labs: Basic Metabolic Panel: Recent Labs  Lab 11/16/20 0013 11/17/20 0506 11/17/20 2106 11/18/20 0445  NA 132* 132*  --  135  K 3.8 3.9  --  4.2  CL 98 95*  --  98  CO2 27 25  --  27  GLUCOSE 288* 273*  --  257*  BUN 34* 53*  --  34*  CREATININE 5.14* 6.05*  --  3.85*  CALCIUM 8.9 9.1  --  7.9*  PHOS 1.5* <1.0* 1.6* 3.4   Liver Function Tests: No results for input(s): AST, ALT, ALKPHOS, BILITOT, PROT, ALBUMIN in the last 168 hours. No results for input(s): LIPASE, AMYLASE in the last 168 hours. CBC: Recent Labs  Lab 11/14/20 2144 11/15/20 0107 11/15/20 0211 11/16/20 0013 11/17/20 0506 11/18/20 0445  WBC 6.6 5.5  --  5.9 7.2 9.1  HGB 11.0* 12.8   < > 11.2* 11.2* 10.3*  HCT 39.0 46.2*   < > 36.3 35.6* 32.6*  MCV 109.2* 111.1*  --  99.5 99.2 97.3  PLT 131* 124*  --  135* 148* 149*   < > = values in this interval not displayed.   CBG: Recent Labs  Lab 11/17/20 1520 11/17/20 1905 11/17/20 2311 11/18/20 0322 11/18/20 0725  GLUCAP 261* 210* 214* 246* 234*   Studies/Results: DG CHEST PORT 1 VIEW  Result Date: 11/16/2020 CLINICAL DATA:  Hypoxia EXAM: PORTABLE CHEST 1 VIEW COMPARISON:  November 15, 2020 FINDINGS: Endotracheal tube tip is 3.0 cm above the carina. Nasogastric tube tip and side port are below the diaphragm. No pneumothorax. There are  pleural effusions bilaterally with bibasilar atelectasis. There is cardiomegaly with mild pulmonary venous hypertension. No adenopathy. No bone lesions. IMPRESSION: Tube and catheter positions as described without pneumothorax. Cardiomegaly with suspected degree of pulmonary vascular congestion. Pleural effusions bilaterally. Suspect a degree of underlying congestive heart failure. No appreciable airspace consolidation or well-defined pulmonary edema. Electronically Signed   By: Lowella Grip III M.D.   On: 11/16/2020 10:18   Medications: . sodium chloride 10 mL/hr at 11/18/20 0800  . dexmedetomidine (PRECEDEX) IV infusion 0.2 mcg/kg/hr (11/18/20 0800)  . feeding supplement (VITAL AF 1.2 CAL) 1,000 mL (11/17/20 1844)  . fentaNYL infusion INTRAVENOUS Stopped (11/17/20 1313)   . aspirin  81 mg Per Tube Daily  . atorvastatin  40 mg Per Tube q1800  . docusate  100 mg Per Tube BID  . heparin  5,000 Units Subcutaneous Q8H  . insulin aspart  0-20 Units Subcutaneous Q4H  . insulin glargine  15 Units Subcutaneous Daily  . mouth rinse  15 mL Mouth Rinse 10 times per day  . midodrine  10 mg Per Tube Q T,Th,Sa-HD  . mirtazapine  7.5 mg Per Tube QHS  . multivitamin  1  tablet Per Tube QHS  . pantoprazole sodium  40 mg Per Tube Daily  . polyethylene glycol  17 g Per Tube Daily    OP HD: TTS [place] (last HD 4/2, prior to that last was 3/24)  4h  350/500  82kg  2/2 bath  P4  AVF  Hep none - Hectoral 31mcg IV q HD  Assessment/Plan: 1. Resp failure acute w/ hypoxia and hypercapnia - per CCM due to aspiration pneumonitis, possible OSA / OHS as well. ECHO showed pulm HTN, HFpEF, mod-severe MS. Vent per CCM. UF as tolerated with HD (hypotension on HD may be related to underlying pHTN/valvular dz/ cor pulm) 2. Fevers: per primary, low threshold to start abx 3.  ESRD: maintain TTS scheduled here, next hd tomorrow 4.  HTN - BP's high but were dropping on HD. BP unaffected on 4/12 5.  Anemia of ESRD: no ESA  need at this time, hgb at goal, monitor for now 6.  Secondary hyperparathyroidism/Metabolic bone disease: stopped binders, see #11 7.  T2DM: per primary 8.  PAD/leg wounds: ischemic/stasis dermatitis per WOC notes 9.  CAD/HFrEF: UF as tolerated 10. Hyponatremia: stable/improved, managing with HD, 137Na bath, UF as tolerated 11. Hypophosphatemia: replete prn, not on any binders at the moment   Gean Quint, MD [facility] 11/18/2020, 8:18 AM

## 2020-11-18 NOTE — Progress Notes (Signed)
Patient reintubated due to respiratory failure after extubation earlier today. Son [name] updated and all questions answered.   Molli Hazard A, DO 11/18/2020, 5:39 PM Pager: [phone]

## 2020-11-19 ENCOUNTER — Inpatient Hospital Stay (HOSPITAL_COMMUNITY): Payer: Medicare HMO

## 2020-11-19 DIAGNOSIS — R400 Somnolence: Secondary | ICD-10-CM | POA: Diagnosis not present

## 2020-11-19 DIAGNOSIS — Z978 Presence of other specified devices: Secondary | ICD-10-CM | POA: Diagnosis not present

## 2020-11-19 DIAGNOSIS — Z9911 Dependence on respirator [ventilator] status: Secondary | ICD-10-CM | POA: Diagnosis not present

## 2020-11-19 DIAGNOSIS — J9602 Acute respiratory failure with hypercapnia: Secondary | ICD-10-CM | POA: Diagnosis not present

## 2020-11-19 DIAGNOSIS — N186 End stage renal disease: Secondary | ICD-10-CM | POA: Diagnosis not present

## 2020-11-19 LAB — CULTURE, BLOOD (ROUTINE X 2)
Culture: NO GROWTH
Culture: NO GROWTH
Special Requests: ADEQUATE
Special Requests: ADEQUATE

## 2020-11-19 LAB — BASIC METABOLIC PANEL
Anion gap: 11 (ref 5–15)
BUN: 48 mg/dL — ABNORMAL HIGH (ref 8–23)
CO2: 27 mmol/L (ref 22–32)
Calcium: 8.5 mg/dL — ABNORMAL LOW (ref 8.9–10.3)
Chloride: 98 mmol/L (ref 98–111)
Creatinine, Ser: 4.88 mg/dL — ABNORMAL HIGH (ref 0.44–1.00)
GFR, Estimated: 9 mL/min — ABNORMAL LOW (ref >60)
Glucose, Bld: 161 mg/dL — ABNORMAL HIGH (ref 70–99)
Potassium: 4.3 mmol/L (ref 3.5–5.1)
Sodium: 136 mmol/L (ref 135–145)

## 2020-11-19 LAB — CBC
HCT: 32.9 % — ABNORMAL LOW (ref 36.0–46.0)
Hemoglobin: 10.2 g/dL — ABNORMAL LOW (ref 12.0–15.0)
MCH: 30.4 pg (ref 26.0–34.0)
MCHC: 31 g/dL (ref 30.0–36.0)
MCV: 98.2 fL (ref 80.0–100.0)
Platelets: 192 10*3/uL (ref 150–400)
RBC: 3.35 MIL/uL — ABNORMAL LOW (ref 3.87–5.11)
RDW: 14.4 % (ref 11.5–15.5)
WBC: 10.1 10*3/uL (ref 4.0–10.5)
nRBC: 0 % (ref 0.0–0.2)

## 2020-11-19 LAB — GLUCOSE, CAPILLARY
Glucose-Capillary: 147 mg/dL — ABNORMAL HIGH (ref 70–99)
Glucose-Capillary: 142 mg/dL — ABNORMAL HIGH (ref 70–99)
Glucose-Capillary: 169 mg/dL — ABNORMAL HIGH (ref 70–99)
Glucose-Capillary: 171 mg/dL — ABNORMAL HIGH (ref 70–99)
Glucose-Capillary: 160 mg/dL — ABNORMAL HIGH (ref 70–99)
Glucose-Capillary: 191 mg/dL — ABNORMAL HIGH (ref 70–99)
Glucose-Capillary: 62 mg/dL — ABNORMAL LOW (ref 70–99)

## 2020-11-19 LAB — MAGNESIUM: Magnesium: 1.9 mg/dL (ref 1.7–2.4)

## 2020-11-19 LAB — PHOSPHORUS: Phosphorus: 3.1 mg/dL (ref 2.5–4.6)

## 2020-11-19 MED ORDER — VITAL AF 1.2 CAL PO LIQD
1000.0000 mL | ORAL | Status: DC
  Administered 2020-11-19 – 2020-11-23 (×3): 1000 mL
  Filled 2020-11-19: qty 1000

## 2020-11-19 MED ORDER — DEXTROSE 50 % IV SOLN
12.5000 g | INTRAVENOUS | Status: AC
  Administered 2020-11-19: 12.5 g via INTRAVENOUS

## 2020-11-19 MED ORDER — MIDAZOLAM HCL 2 MG/2ML IJ SOLN
2.0000 mg | INTRAMUSCULAR | Status: AC | PRN
  Administered 2020-11-19: 2 mg via INTRAVENOUS
  Filled 2020-11-19: qty 2

## 2020-11-19 MED ORDER — DEXTROSE 50 % IV SOLN
INTRAVENOUS | Status: AC
  Filled 2020-11-19: qty 50

## 2020-11-19 NOTE — Progress Notes (Signed)
Nutrition Follow-up  DOCUMENTATION CODES:  Obesity unspecified  INTERVENTION:   Continue tube feeding via OGT: Vital AF 1.2 at 60 ml/h (1440 ml per day). Provides 1728 kcal, 108 gm protein, 1168 ml free water daily  Continue rena-vit via tube daily  NUTRITION DIAGNOSIS:  Increased nutrient needs related to chronic illness as evidenced by estimated needs.  Progressing  GOAL:  Patient will meet greater than or equal to 90% of their needs   Tube feed infusing  MONITOR:  TF tolerance,Skin,Vent status,Labs,I & O's  REASON FOR ASSESSMENT:  Consult Enteral/tube feeding initiation and management  ASSESSMENT:  [name] is a pleasant 75 year old female with a past medical history of ESRD on hemodialysis, type 2 diabetes mellitus, CAD, CHF (last echo 11/25/2017 with EF 60 to 65% and grade 1 diastolic dysfunction), hypertension, peripheral arterial disease, CVA who presents with gradual onset of 2 weeks of weakness, she notes she has not gone to dialysis in the past week.  Pt extubated briefly yesterday but was reintubated after she was unable to maintain respiration status. Vital AF currently infusing at 88mL/h which is goal. Pt awake at the time of assessment, not on sedation. No family at bedside. Will continue to monitor trajectory. No plans to extubation today, will reattempt as able later this week,   OGT in place (gastric)  4/9 - Decline in status, transferred to ICU. Intubated 4/10 - TF initiated via OGT 4/13 - extubated, re-intubated for respiratory failure  Patient is currently intubated on ventilator support MV: 11.2 L/min Temp (24hrs), Avg:99.1 F (37.3 C), Min:98.6 F (37 C), Max:99.6 F (37.6 C)  Nutritionally Relevant Meds: . atorvastatin  40 mg Per Tube q1800  . docusate  100 mg Per Tube BID  . insulin aspart, novolog  0-20 Units Subcutaneous Q4H  . insulin glargine, lantus  15 Units Subcutaneous Daily  . mirtazapine  7.5 mg Per Tube QHS  . Rena-vit  1 tablet  Per Tube QHS  . pantoprazole sodium  40 mg Per Tube Daily  . polyethylene glycol  17 g Per Tube Daily   Continuous Infusions: . dexmedetomidine (PRECEDEX) IV infusion Stopped (11/19/20 0514)   Labs reviewed:   SBG ranges 49-234mg /dL over the last 24 hours  HgbA1c 4/16, 8.0%  BUN 48, creatinine, 4.88  NUTRITION - FOCUSED PHYSICAL EXAM: Flowsheet Row Most Recent Value  Orbital Region Mild depletion  Upper Arm Region No depletion  Thoracic and Lumbar Region No depletion  Buccal Region No depletion  Temple Region No depletion  Clavicle Bone Region No depletion  Clavicle and Acromion Bone Region No depletion  Scapular Bone Region No depletion  Dorsal Hand Unable to assess  [mittens and restraints in place]  Patellar Region No depletion  Anterior Thigh Region No depletion  Posterior Calf Region Unable to assess  [wrappings from ankle to knee from WOC]  Edema (RD Assessment) Moderate  [generalized]  Hair Reviewed  Eyes Unable to assess  Mouth Unable to assess  Skin Reviewed  Nails Reviewed    Adipose tissue and edema could be masking signs of malnutrition  Diet Order:   Diet Order            Diet NPO time specified  Diet effective now                EDUCATION NEEDS:  Not appropriate for education at this time  Skin:  Skin Assessment: Skin Integrity Issues: Skin Integrity Issues:: Other (Comment) Other: chronic BLE wounds related to veous insufficiency and  PAD  Last BM:  4/14 per RN documentation, type 7  Height:  Ht Readings from Last 1 Encounters:  11/06/2020 5\' 3"  (1.6 m)    Weight:  Wt Readings from Last 1 Encounters:  11/19/20 87 kg    Ideal Body Weight:  52.3 kg  BMI:  Body mass index is 33.98 kg/m.  Estimated Nutritional Needs:   Kcal:  1800-2000 kcal  Protein:  105-131 g (2-2.5g IBW)  Fluid:  1024mL + UOP  Ranell Patrick, RD, LDN Clinical Dietitian Pager on Amion

## 2020-11-19 NOTE — Progress Notes (Signed)
RT note. Called to pt. Bed due to pt. Pulling ETT out to 21cm, RT advanced ETT back to 24cm @ lips, VT confirmed, pt. Sat 100%, VSS RT will continue to monitor.

## 2020-11-19 NOTE — Progress Notes (Signed)
[place] Progress Note Patient Name: Kathleen [name] DOB: May 12, 1946 MRN: [id number]   Date of Service  11/19/2020  HPI/Events of Note  Patient with ventilator dyssynchrony and agitation on Precedex + Fentanyl.   eICU Interventions  PRN Versed added to optimize sedation and ventilator synchrony.        Frederik Pear 11/19/2020, 8:43 PM

## 2020-11-19 NOTE — Progress Notes (Addendum)
NAME:  Kathleen [name], MRN:  [id number], DOB:  14-Jan-1946, LOS: 2 ADMISSION DATE:  11/16/2020, CONSULTATION DATE:  4/9 REFERRING MD:  Dr. McDiarmid, CHIEF COMPLAINT:  Pulmonary hypertension  History of Present Illness:  75 year old female with PMH as below, which is significant for ESRD, HFpEF, CVA, DM, CAD and tobacco abuse. She presented to [facility] ED 4/5 with complaints of progressive weakness x 2 week and has missed HD for the entire week prior to presentation. No other specific symptoms at the time of admission. In the emergency department she was found to have hypercarbic respiratory failure and was started on BiPAP. It was felt that with HD she would improve. She largely did improve with HD, despite difficulty using NIMV nocturnally.  Echocardiogram was done as part of the workup and showed markedly elevated pulmonary artery systolic pressure of 93-57%. PCCM was consulted for new diagnosis of pulmonary hypertension.   Later that evening she had vomiting episodes and developed somnolence. She was found to be hypercapneic and was moved to the ICU for intubation and mechanical ventilation.  Pertinent  Medical History   has a past medical history of Acute CVA (cerebrovascular accident) ([place]) (11/08/2017), Acute respiratory failure with hypercapnia ([place]), Acute respiratory failure with hypoxia ([place]) (01/29/2019), Altered mental status (07/14/2017), Anemia, Arthritis, Asthma, Benign neoplasm of ascending colon, Benign neoplasm of cecum, Benign neoplasm of sigmoid colon, BPPV (benign paroxysmal positional vertigo) (01/22/2014), CAD (coronary artery disease), CVA (cerebral vascular accident) ([place]) (11/09/2017), Diabetes mellitus without complication ([place]), Disorientation, ESRD (end stage renal disease) ([place]), Gastritis and gastroduodenitis, Hematochezia, History of echocardiogram, transfusion, Hyperlipidemia, Hypernatremia, Hypertension, Hypertensive urgency (01/21/2014), Meningioma ([place]), Noncompliance with  medication regimen, Obesity, Pleural effusion, left, Pulmonary edema (01/29/2019), Pulmonary hypertension due to sleep-disordered breathing ([place]) (11/14/2020), Pulmonary vascular congestion, Rectal bleeding (09/01/2018), Renal insufficiency, Syncope and collapse (08/14/7937), Systolic and diastolic CHF, acute ([place]), Systolic CHF ([place]), and Tobacco abuse (01/21/2014).   Significant Hospital Events: Including procedures, antibiotic start and stop dates in addition to other pertinent events   . 4/4 admit  . 4/8 Echo LVEF 60-65%. RV systolic function normal. Mod/sev mitral stenosis, mod TR.  . 4/11 mains on vent  . 4/13 extubated . 4/14 reintubated   Interim History / Subjective:  Intubated, awake. Following commands.   Objective   Blood pressure 137/62, pulse (!) 59, temperature 98.8 F (37.1 C), temperature source Oral, resp. rate (!) 22, height 5\' 3"  (1.6 m), weight 87 kg, SpO2 94 %.    Vent Mode: PRVC FiO2 (%):  [40 %] 40 % Set Rate:  [22 bmp] 22 bmp Vt Set:  [420 mL] 420 mL PEEP:  [5 cmH20] 5 cmH20 Plateau Pressure:  [18 cmH20-20 cmH20] 20 cmH20   Intake/Output Summary (Last 24 hours) at 11/19/2020 0658 Last data filed at 11/19/2020 0500 Gross per 24 hour  Intake 987.25 ml  Output --  Net 987.25 ml   Filed Weights   11/17/20 1126 11/18/20 0400 11/19/20 0500  Weight: 83.6 kg 86.8 kg 87 kg    Examination:  General: Elderly female, critically ill intubated, awake  HENT: NCAT, endotracheal tube in place Lungs: Bilateral mechanically ventilated breaths, minimal secretions  Cardiovascular: Regular rate rhythm, S1-S2 Abdomen: Obese, soft, nontender, nondistended, NTTP  Extremities: Bilateral legs wrapped Neuro: awake, following commands, nodding yes or no to questions   Labs/imaging that I have personally reviewed  (right click and "Reselect all SmartList Selections" daily)  ECHO: LVEF 60-65%. RV systolic function normal. Mod/sev mitral stenosis, mod TR.  Resolved Hospital Problem  list     Assessment & Plan:   Acute respiratory failure with hypoxia and hypercapnia requiring MV due to aspiration pneumonitis. Failed extubation yesterday due to increased secretions and aspiration.  P: - cxr yesterday w/increased vascular congestion, will likely need HD prior to extubation  - Monitoring off antimicrobials, afebrile overnight, no leuks - cont. Adult mechanical vent protocol - VAP prophylaxis - PAD guidelines for sedation - Daily SAT SBT  - palliative consulted, appreciate assistance and family discussion. Patient made DNR yesterday with preference for reintubation with respiratory failure.   Pulmonary hypertension as identified of transthoracic echocardiogram 4/8. Right ventricular systolic pressure is estimated at 79.6. She has a history concerning for group 2 disease in the setting of known hypertension, HFpEF, and valvular heart disease (mod to severe mitral stenosis). Also concern for group 3 disease considering likely undiagnosed OSA/OHS. I doubt cteph but can get work up in outpatient. Acute on chronic diastolic heart failure  Probable OSA, at risk for OHS Moderate to severe MS HTN P:  Management with HD Will need outpatient PSG, likely referral to Dr. Silas Flood for pH evaluation and management.  TIIDM - Tube feeds continued - cont. lantus 20U, cont. ssi   ESRD, hypervolemia from dialysis noncompliance Hypocalcemia Hypophosphatemia - repleted  -IHD per nephrology -check albumin, ionized Ca  History of CVA/ CAD -Aspirin plus statin  Best practice (right click and "Reselect all SmartList Selections" daily)  Diet:  Tube Feed  Pain/Anxiety/Delirium protocol (if indicated): Yes (RASS goal 0) VAP protocol (if indicated): Yes DVT prophylaxis: Subcutaneous Heparin GI prophylaxis: N/A and PPI Glucose control:  SSI Yes Central venous access:  N/A Arterial line:  N/A Foley:  N/A Mobility:  bed rest  PT consulted: N/A Last date of multidisciplinary goals  of care discussion: Family updated yesterday evening after reintubation, 4/13 Code Status:  full code Disposition: ICU  Labs   CBC: Recent Labs  Lab 11/15/20 0107 11/15/20 0211 11/16/20 0013 11/17/20 0506 11/18/20 0445 11/19/20 0142  WBC 5.5  --  5.9 7.2 9.1 10.1  HGB 12.8 12.6 11.2* 11.2* 10.3* 10.2*  HCT 46.2* 37.0 36.3 35.6* 32.6* 32.9*  MCV 111.1*  --  99.5 99.2 97.3 98.2  PLT 124*  --  135* 148* 149* 654    Basic Metabolic Panel: Recent Labs  Lab 11/15/20 0107 11/15/20 0211 11/16/20 0013 11/17/20 0506 11/17/20 2106 11/18/20 0445 11/19/20 0142  NA 135 134* 132* 132*  --  135 136  K 4.4 3.8 3.8 3.9  --  4.2 4.3  CL 96*  --  98 95*  --  98 98  CO2 26  --  27 25  --  27 27  GLUCOSE 84  --  288* 273*  --  257* 161*  BUN 15  --  34* 53*  --  34* 48*  CREATININE 4.05*  --  5.14* 6.05*  --  3.85* 4.88*  CALCIUM 9.8  --  8.9 9.1  --  7.9* 8.5*  MG 2.2  --  1.9 2.0  --  1.7 1.9  PHOS 5.1*  --  1.5* <1.0* 1.6* 3.4 3.1   GFR: Estimated Creatinine Clearance: 10.2 mL/min (A) (by C-G formula based on SCr of 4.88 mg/dL (H)). Recent Labs  Lab 11/15/20 0108 11/16/20 0013 11/17/20 0506 11/18/20 0445 11/18/20 0826 11/19/20 0142  PROCALCITON  --   --   --   --  2.95  --   WBC  --  5.9 7.2 9.1  --  10.1  LATICACIDVEN 1.0  --   --   --   --   --    Seawell, Jaimie A, DO 11/19/2020, 6:58 AM Pager: [phone] .   Pulmonary critical care attending:  This is a 75 year old female acute on chronic diastolic heart failure, acute respiratory failure, ESRD on IHD, missed dialysis prior to admission.  Tolerated dialysis.  Was doing well tolerated SBT SAT yesterday.  Unfortunately was unable to handle secretions and was reintubated.  BP (!) 120/92 (BP Location: Right Wrist)   Pulse 96   Temp 99.2 F (37.3 C) (Axillary)   Resp (!) 22   Ht 5\' 3"  (1.6 m)   Wt 83 kg   LMP  (LMP Unknown)   SpO2 99%   BMI 32.41 kg/m   General: Obese female intubated on mechanical life  support HEENT: Endotracheal tube in place Heart: Regular rhythm S1-S2 Lungs: Bilateral mechanically limited breath sounds Abdomen: Soft nontender nondistended  Labs: Reviewed  Assessment: Acute hypoxemic respiratory failure requiring intubation mechanical ventilation Pulmonary hypertension Acute on chronic diastolic heart failure Probable OSA Inability to handle secretions post extubation which I think will possibly from acute pulmonary edema.  She had very thin frothy secretions at the time of reintubation.  ESRD on HD.  Plan: Continue to wean from mechanical support as tolerated Repeat session of dialysis today. Retry SBT SAT tomorrow. Appreciate palliative care input. VAP prophylaxis PAD guidelines sedation. Need to clarify goals of care with family before extubation attempt next time.  This patient is critically ill with multiple organ system failure; which, requires frequent high complexity decision making, assessment, support, evaluation, and titration of therapies. This was completed through the application of advanced monitoring technologies and extensive interpretation of multiple databases. During this encounter critical care time was devoted to patient care services described in this note for 33 minutes.  [facility] 11/19/2020 6:38 PM

## 2020-11-19 NOTE — Progress Notes (Signed)
Daily Progress Note   Patient Name: Kathleen [name]       Date: 11/19/2020 DOB: Jan 08, 1946  Age: 75 y.o. MRN#: [id number] Attending Physician: Garner Nash, DO Primary Care Physician: Benito Mccreedy, MD Admit Date: 11/22/2020  Reason for Consultation/Follow-up: Establishing goals of care  Subjective: Medical records reviewed. Discussed with RN Apolonio Schneiders and assessed patient at the bedside. She is intubated and alert. She will nod her head yes and no to answer questions. Patient denies pain or distress. No family at the bedside. Per RN, patient has been calm and comfortable today. There are no plans to extubate today, but possibly tomorrow.  Called patient's son [name] to provide support and continue goals of care conversations. Initially left a voicemail and I then received a return call. [name] is glad to hear that [place] has been comfortable and alert today. He has not visited her yet, as he thought she would go to HD this morning. Provided reassurance that patient is still planned for HD later today. He remains hopeful for improvement and will attempt to visit patient before HD. He is open to continuing our conversation later, as he still needs to drop off his children before visiting the patient. Provided update that extubation will not be attempted today but possibly in the next couple of days. [name] verbalizes understanding and appreciation of the updates.  Called patient's daughter [name] to provide support. She was updated yesterday by her brother [name] regarding patient's progress and planned extubation, but she was unaware that the patient then required reintubation a few hours later. She responds with brief statements and appears to lean on [name] in particular for guidance and  decision-making. Provided with PMT contact information and encouraged her to reach out with any questions or concerns.   Questions and concerns addressed. PMT will continue to support holistically.  Review of Systems  Unable to perform ROS: Intubated    Length of Stay: 8  Current Medications: Scheduled Meds:  . aspirin  81 mg Per Tube Daily  . atorvastatin  40 mg Per Tube q1800  . docusate  100 mg Per Tube BID  . heparin  5,000 Units Subcutaneous Q8H  . insulin aspart  0-20 Units Subcutaneous Q4H  . insulin glargine  15 Units Subcutaneous Daily  . mouth rinse  15 mL Mouth Rinse  10 times per day  . midodrine  10 mg Per Tube Q T,Th,Sa-HD  . mirtazapine  7.5 mg Per Tube QHS  . multivitamin  1 tablet Per Tube QHS  . pantoprazole sodium  40 mg Per Tube Daily  . polyethylene glycol  17 g Per Tube Daily    Continuous Infusions: . sodium chloride Stopped (11/19/20 0401)  . dexmedetomidine (PRECEDEX) IV infusion Stopped (11/19/20 0402)  . feeding supplement (VITAL AF 1.2 CAL) 1,000 mL (11/19/20 1004)    PRN Meds: acetaminophen **OR** acetaminophen, fentaNYL (SUBLIMAZE) injection, fentaNYL (SUBLIMAZE) injection, hydrALAZINE  Physical Exam Vitals and nursing note reviewed.  Constitutional:      Appearance: She is ill-appearing.  Cardiovascular:     Rate and Rhythm: Normal rate.  Pulmonary:     Comments: intubated Neurological:     Mental Status: She is alert.     Comments: Responsive.             Vital Signs: BP (!) 130/49   Pulse 68   Temp 99.6 F (37.6 C) (Axillary)   Resp (!) 22   Ht 5\' 3"  (1.6 m)   Wt 87 kg   LMP  (LMP Unknown)   SpO2 98%   BMI 33.98 kg/m  SpO2: SpO2: 98 % O2 Device: O2 Device: Ventilator O2 Flow Rate: O2 Flow Rate (L/min): 6 L/min  Intake/output summary:   Intake/Output Summary (Last 24 hours) at 11/19/2020 1232 Last data filed at 11/19/2020 1100 Gross per 24 hour  Intake 1037.07 ml  Output --  Net 1037.07 ml   LBM: Last BM Date:  11/18/20 Baseline Weight: Weight: 88.7 kg Most recent weight: Weight: 87 kg       Palliative Assessment/Data: PPS: 10%      Patient Active Problem List   Diagnosis Date Noted  . Possible Pulmonary hypertension ([place]) 11/14/2020  . Mitral stenosis 11/14/2020  . Diastolic dysfunction 55/73/2202  . Acute hypercapnic respiratory failure ([place]) 11/14/2020  . Balance problems 11/13/2020  . Muscle weakness (generalized) 11/13/2020  . History of CVA (cerebrovascular accident) 11/13/2020  . Encounter for assessment of decision-making capacity 11/12/2020  . Agitation   . Chronic respiratory failure with hypercapnia ([place])   . Weakness   . ESRD (end stage renal disease) ([place]) 11/20/2020  . DM (diabetes mellitus), type 2 with renal complications ([place]) 54/27/0623  . Peripheral arterial disease ([place]) 02/23/2016  . Anemia in chronic kidney disease 12/18/2015  . Somnolence   . Hypertension associated with diabetes ([place])   . Unspecified vitamin D deficiency   . Chronic combined systolic and diastolic heart failure ([place]) 02/12/2014  . CAD (coronary artery disease)   . Obesity   . Meningioma ([place])   . Hyperlipidemia associated with type 2 diabetes mellitus ([place]) 01/22/2014    Palliative Care Assessment & Plan   Patient Profile: 75 y.o. female  with past medical history of on hemodialysis Tuesday Thursday Saturday, CHF with EF approximately 60% also with mitral stenosis, PAD, DM, CAD, HLD, HTN, admitted on 11/23/2020 with increasing weakness and nausea, reported she had missed dialysis for 2 weeks due to not feeling like going.  Admission has been complicated with respiratory acidosis, hypercapnea, she did not tolerate BiPAP-pulling it off.  On April 7 she had worsening confusion.  April 9 it was noted she likely has pulmonary hypertension she had worsening lethargy and vomiting and required intubation.  Continues to be ventilated with likely aspiration pneumonitis due to aspiration event.  She was  hypotensive during dialysis.  Palliative medicine consulted for goals of care.    Assessment/Recommendations/Plan  Continue current interventions Family remains hopeful for improvement and are encouraged by patient's alertness and responsiveness Psychosocial and emotional support provided Ongoing GOC discussions with particular emphasis on expectations for further extubation attempts, preferences for reintubation vs comfort care if patient declines   Goals of Care and Additional Recommendations: Limitations on Scope of Treatment: Full Scope Treatment  Code Status: DNR  Prognosis:  Unable to determine  Discharge Planning: To Be Determined  Care plan was discussed with patient's family, [name] and [name].  Thank you for allowing the Palliative Medicine Team to assist in the care of this patient.   Total time: 20 minutes  Greater than 50% of this time was spent counseling and coordinating care related to the above assessment and plan.  Dorthy Cooler, PA-C Palliative Medicine Team   Please contact Palliative Medicine Team phone at [phone] for questions and concerns.

## 2020-11-19 NOTE — Progress Notes (Signed)
[facility] Progress Note   Subjective:  Seen in ICU. Extubated yesterday however not protecting airway (congested/wheezing, hypoxic, not tolerating bipap, not clearing secretions), reintubated 4/13. Briefly hypotensive post-intubation, briefly required levo which has been off now. Increased vasc congestion on cxr yesterday  Objective Vitals:   11/19/20 0600 11/19/20 0700 11/19/20 0737 11/19/20 0800  BP: (!) 119/53 116/86    Pulse: 63 62    Resp: 15 (!) 22    Temp:   99.3 F (37.4 C)   TempSrc:   Axillary   SpO2: 95% 96%  98%  Weight:      Height:       Physical Exam General:  Sedated, ill appearing Heart: RRR Lungs: slightly diminished air entry bibasilar, intubated Abdomen: soft, non-tender Extremities: lower legs wrapped Neuro: sedated Dialysis Access: LUE AVF + bruit  Additional Objective Labs: Basic Metabolic Panel: Recent Labs  Lab 11/17/20 0506 11/17/20 2106 11/18/20 0445 11/19/20 0142  NA 132*  --  135 136  K 3.9  --  4.2 4.3  CL 95*  --  98 98  CO2 25  --  27 27  GLUCOSE 273*  --  257* 161*  BUN 53*  --  34* 48*  CREATININE 6.05*  --  3.85* 4.88*  CALCIUM 9.1  --  7.9* 8.5*  PHOS <1.0* 1.6* 3.4 3.1   Liver Function Tests: Recent Labs  Lab 11/18/20 0826  ALBUMIN 2.5*   No results for input(s): LIPASE, AMYLASE in the last 168 hours. CBC: Recent Labs  Lab 11/15/20 0107 11/15/20 0211 11/16/20 0013 11/17/20 0506 11/18/20 0445 11/19/20 0142  WBC 5.5  --  5.9 7.2 9.1 10.1  HGB 12.8   < > 11.2* 11.2* 10.3* 10.2*  HCT 46.2*   < > 36.3 35.6* 32.6* 32.9*  MCV 111.1*  --  99.5 99.2 97.3 98.2  PLT 124*  --  135* 148* 149* 192   < > = values in this interval not displayed.   CBG: Recent Labs  Lab 11/18/20 1721 11/18/20 1911 11/18/20 2312 11/19/20 0317 11/19/20 0715  GLUCAP 110* 110* 140* 147* 142*   Studies/Results: DG CHEST PORT 1 VIEW  Result Date: 11/18/2020 CLINICAL DATA:  Weakness, vomiting. EXAM: PORTABLE CHEST 1 VIEW  COMPARISON:  November 16, 2020. FINDINGS: Stable cardiomegaly with central pulmonary vascular congestion. Endotracheal tube is in good position. Nasogastric tube has been removed. Mild bilateral pulmonary edema cannot be excluded. Right basilar opacity is noted concerning for effusion with associated atelectasis. Bony thorax is unremarkable. IMPRESSION: Stable cardiomegaly with central pulmonary vascular congestion and possible bilateral pulmonary edema. Right basilar opacity is noted concerning for effusion with associated atelectasis. Electronically Signed   By: Marijo Conception M.D.   On: 11/18/2020 16:59   DG Abd Portable 1V  Result Date: 11/18/2020 CLINICAL DATA:  Enteric catheter placement EXAM: PORTABLE ABDOMEN - 1 VIEW COMPARISON:  11/15/2020 FINDINGS: Portable supine frontal view of the lower chest and upper abdomen was performed, excluding the right flank by collimation. Evaluation limited by portable technique and body habitus. Enteric catheter tip and side port project over the gastric antrum. Unremarkable bowel gas pattern. IMPRESSION: 1. Enteric catheter projecting over gastric antrum. Electronically Signed   By: Randa Ngo M.D.   On: 11/18/2020 18:17   Medications: . sodium chloride 10 mL/hr at 11/19/20 0400  . dexmedetomidine (PRECEDEX) IV infusion Stopped (11/19/20 0514)  . feeding supplement (VITAL AF 1.2 CAL)     . aspirin  81 mg Per  Tube Daily  . atorvastatin  40 mg Per Tube q1800  . docusate  100 mg Per Tube BID  . heparin  5,000 Units Subcutaneous Q8H  . insulin aspart  0-20 Units Subcutaneous Q4H  . insulin glargine  15 Units Subcutaneous Daily  . mouth rinse  15 mL Mouth Rinse 10 times per day  . midodrine  10 mg Per Tube Q T,Th,Sa-HD  . mirtazapine  7.5 mg Per Tube QHS  . multivitamin  1 tablet Per Tube QHS  . pantoprazole sodium  40 mg Per Tube Daily  . polyethylene glycol  17 g Per Tube Daily    OP HD: TTS [place] (last HD 4/2, prior to that last was 3/24)  4h   350/500  82kg  2/2 bath  P4  AVF  Hep none - Hectoral 87mcg IV q HD  Assessment/Plan: 1. Resp failure acute w/ hypoxia and hypercapnia - per CCM due to aspiration pneumonitis, possible OSA / OHS as well. ECHO showed pulm HTN, HFpEF, mod-severe MS. Vent per CCM. UF as tolerated with HD  2. Fevers: per primary, low threshold to start abx 3.  ESRD: maintain TTS scheduled here, next hd today 4.  HTN - BP's high but were dropping on HD. BP unaffected on 4/12 5.  Anemia of ESRD: no ESA need at this time, hgb at goal, monitor for now 6.  Secondary hyperparathyroidism/Metabolic bone disease: stopped binders, see #11 7.  T2DM: per primary 8.  PAD/leg wounds: ischemic/stasis dermatitis per WOC + primary 9.  CAD/HFrEF: UF as tolerated 10. Hyponatremia: stable/improved, managing with HD, 137Na bath, UF as tolerated 11. Hypophosphatemia: replete prn, not on any binders at the moment   Gean Quint, MD [facility] 11/19/2020, 9:23 AM

## 2020-11-20 DIAGNOSIS — Z978 Presence of other specified devices: Secondary | ICD-10-CM | POA: Diagnosis not present

## 2020-11-20 DIAGNOSIS — I5189 Other ill-defined heart diseases: Secondary | ICD-10-CM | POA: Diagnosis not present

## 2020-11-20 DIAGNOSIS — J9612 Chronic respiratory failure with hypercapnia: Secondary | ICD-10-CM | POA: Diagnosis not present

## 2020-11-20 DIAGNOSIS — J9602 Acute respiratory failure with hypercapnia: Secondary | ICD-10-CM | POA: Diagnosis not present

## 2020-11-20 DIAGNOSIS — J9601 Acute respiratory failure with hypoxia: Secondary | ICD-10-CM | POA: Diagnosis not present

## 2020-11-20 DIAGNOSIS — N186 End stage renal disease: Secondary | ICD-10-CM | POA: Diagnosis not present

## 2020-11-20 DIAGNOSIS — Z9911 Dependence on respirator [ventilator] status: Secondary | ICD-10-CM

## 2020-11-20 DIAGNOSIS — E1121 Type 2 diabetes mellitus with diabetic nephropathy: Secondary | ICD-10-CM | POA: Diagnosis not present

## 2020-11-20 LAB — RENAL FUNCTION PANEL
Albumin: 2.3 g/dL — ABNORMAL LOW (ref 3.5–5.0)
Anion gap: 8 (ref 5–15)
BUN: 29 mg/dL — ABNORMAL HIGH (ref 8–23)
CO2: 29 mmol/L (ref 22–32)
Calcium: 8.6 mg/dL — ABNORMAL LOW (ref 8.9–10.3)
Chloride: 97 mmol/L — ABNORMAL LOW (ref 98–111)
Creatinine, Ser: 3.55 mg/dL — ABNORMAL HIGH (ref 0.44–1.00)
GFR, Estimated: 13 mL/min — ABNORMAL LOW (ref >60)
Glucose, Bld: 129 mg/dL — ABNORMAL HIGH (ref 70–99)
Phosphorus: 1.8 mg/dL — ABNORMAL LOW (ref 2.5–4.6)
Potassium: 3.9 mmol/L (ref 3.5–5.1)
Sodium: 134 mmol/L — ABNORMAL LOW (ref 135–145)

## 2020-11-20 LAB — GLUCOSE, CAPILLARY
Glucose-Capillary: 128 mg/dL — ABNORMAL HIGH (ref 70–99)
Glucose-Capillary: 157 mg/dL — ABNORMAL HIGH (ref 70–99)
Glucose-Capillary: 178 mg/dL — ABNORMAL HIGH (ref 70–99)
Glucose-Capillary: 193 mg/dL — ABNORMAL HIGH (ref 70–99)
Glucose-Capillary: 200 mg/dL — ABNORMAL HIGH (ref 70–99)
Glucose-Capillary: 226 mg/dL — ABNORMAL HIGH (ref 70–99)

## 2020-11-20 LAB — CBC
HCT: 36.3 % (ref 36.0–46.0)
Hemoglobin: 11.4 g/dL — ABNORMAL LOW (ref 12.0–15.0)
MCH: 30.2 pg (ref 26.0–34.0)
MCHC: 31.4 g/dL (ref 30.0–36.0)
MCV: 96 fL (ref 80.0–100.0)
Platelets: 242 10*3/uL (ref 150–400)
RBC: 3.78 MIL/uL — ABNORMAL LOW (ref 3.87–5.11)
RDW: 14.3 % (ref 11.5–15.5)
WBC: 10.9 10*3/uL — ABNORMAL HIGH (ref 4.0–10.5)
nRBC: 0 % (ref 0.0–0.2)

## 2020-11-20 LAB — CALCIUM, IONIZED: Calcium, Ionized, Serum: 4.6 mg/dL (ref 4.5–5.6)

## 2020-11-20 MED ORDER — MORPHINE SULFATE (PF) 2 MG/ML IV SOLN
2.0000 mg | Freq: Once | INTRAVENOUS | Status: AC
Start: 2020-11-20 — End: 2020-11-20
  Administered 2020-11-20: 2 mg via INTRAVENOUS
  Filled 2020-11-20: qty 1

## 2020-11-20 MED ORDER — SODIUM CHLORIDE 0.9 % IV SOLN
250.0000 mL | INTRAVENOUS | Status: DC
  Administered 2020-11-20: 250 mL via INTRAVENOUS

## 2020-11-20 MED ORDER — SODIUM CHLORIDE 0.9 % IV BOLUS: 500.0000 mL | Freq: Once | INTRAVENOUS | Status: DC

## 2020-11-20 MED ORDER — NOREPINEPHRINE 4 MG/250ML-% IV SOLN: 0.0000 ug/min | INTRAVENOUS | Status: DC

## 2020-11-20 MED ORDER — INSULIN ASPART 100 UNIT/ML ~~LOC~~ SOLN
0.0000 [IU] | SUBCUTANEOUS | Status: DC
  Administered 2020-11-20: 2 [IU] via SUBCUTANEOUS
  Administered 2020-11-20: 3 [IU] via SUBCUTANEOUS
  Administered 2020-11-20 – 2020-11-21 (×2): 2 [IU] via SUBCUTANEOUS
  Administered 2020-11-21 – 2020-11-22 (×4): 1 [IU] via SUBCUTANEOUS
  Administered 2020-11-22: 2 [IU] via SUBCUTANEOUS
  Administered 2020-11-23: 1 [IU] via SUBCUTANEOUS
  Administered 2020-11-23: 2 [IU] via SUBCUTANEOUS
  Administered 2020-11-23: 1 [IU] via SUBCUTANEOUS
  Administered 2020-11-23 (×2): 2 [IU] via SUBCUTANEOUS
  Administered 2020-11-24: 3 [IU] via SUBCUTANEOUS
  Administered 2020-11-24 (×2): 2 [IU] via SUBCUTANEOUS
  Administered 2020-11-24: 3 [IU] via SUBCUTANEOUS
  Administered 2020-11-24: 1 [IU] via SUBCUTANEOUS
  Administered 2020-11-24: 3 [IU] via SUBCUTANEOUS
  Administered 2020-11-24: 2 [IU] via SUBCUTANEOUS
  Administered 2020-11-25: 3 [IU] via SUBCUTANEOUS
  Administered 2020-11-25 (×2): 5 [IU] via SUBCUTANEOUS
  Administered 2020-11-25 (×2): 3 [IU] via SUBCUTANEOUS

## 2020-11-20 MED ORDER — NALOXONE HCL 0.4 MG/ML IJ SOLN: 0.4000 mg | INTRAMUSCULAR | Status: DC | PRN

## 2020-11-20 MED ORDER — INSULIN ASPART 100 UNIT/ML ~~LOC~~ SOLN: 2.0000 [IU] | SUBCUTANEOUS | Status: DC

## 2020-11-20 MED ORDER — NALOXONE HCL 0.4 MG/ML IJ SOLN
INTRAMUSCULAR | Status: AC
  Administered 2020-11-20: 0.4 mg via INTRAVENOUS
  Filled 2020-11-20: qty 1

## 2020-11-20 MED ORDER — POTASSIUM PHOSPHATES 15 MMOLE/5ML IV SOLN
30.0000 mmol | Freq: Once | INTRAVENOUS | Status: AC
  Administered 2020-11-20: 30 mmol via INTRAVENOUS
  Filled 2020-11-20: qty 10

## 2020-11-20 MED ORDER — SCOPOLAMINE 1 MG/3DAYS TD PT72
1.0000 | MEDICATED_PATCH | TRANSDERMAL | Status: DC
  Administered 2020-11-20 – 2020-11-23 (×2): 1.5 mg via TRANSDERMAL
  Filled 2020-11-20 (×2): qty 1

## 2020-11-20 MED ORDER — NOREPINEPHRINE 4 MG/250ML-% IV SOLN
INTRAVENOUS | Status: AC
  Administered 2020-11-20: 4 ug/min via INTRAVENOUS
  Filled 2020-11-20: qty 250

## 2020-11-20 MED ORDER — SODIUM CHLORIDE 0.9 % IV BOLUS
250.0000 mL | Freq: Once | INTRAVENOUS | Status: AC
  Administered 2020-11-20: 250 mL via INTRAVENOUS

## 2020-11-20 MED ORDER — GLYCOPYRROLATE 0.2 MG/ML IJ SOLN
0.1000 mg | Freq: Once | INTRAMUSCULAR | Status: AC
  Administered 2020-11-20: 0.1 mg via INTRAVENOUS
  Filled 2020-11-20: qty 1

## 2020-11-20 MED ORDER — NOREPINEPHRINE 4 MG/250ML-% IV SOLN
2.0000 ug/min | INTRAVENOUS | Status: DC
  Administered 2020-11-21: 6 ug/min via INTRAVENOUS
  Administered 2020-11-22: 7 ug/min via INTRAVENOUS
  Administered 2020-11-22: 4 ug/min via INTRAVENOUS
  Administered 2020-11-23: 3 ug/min via INTRAVENOUS
  Filled 2020-11-20 (×3): qty 250

## 2020-11-20 MED ORDER — INSULIN GLARGINE 100 UNIT/ML ~~LOC~~ SOLN
10.0000 [IU] | Freq: Every day | SUBCUTANEOUS | Status: DC
  Administered 2020-11-20 – 2020-11-23 (×2): 10 [IU] via SUBCUTANEOUS
  Filled 2020-11-20 (×4): qty 0.1

## 2020-11-20 NOTE — Progress Notes (Signed)
Son Kathleen [name] at bedside. Discussed planned extubation as she is at an optimized point respiratory wise and risks of recurrent respiratory failure with deconditioning and chronic illness, that if she were to decompensate and require intubation she would need a tracheostomy. He states he and Shonda have discussed this before, and she would not want this. She is awake and answering questions and endorses the same. Discussed that if she were to progress to respiratory failure they would like to proceed with making her comfort care.    Molli Hazard A, DO 11/20/2020, 12:52 PM Pager: [phone]

## 2020-11-20 NOTE — Progress Notes (Signed)
Patient more arousable at this time. Responds to pain, resists arm being touched when IV removed. Able to open eyes.

## 2020-11-20 NOTE — Progress Notes (Signed)
[facility] Progress Note   Subjective:  Seen in ICU. No acute events, phos to be repleted. Tolerated hd yesterday, net uf 3.5L  Objective Vitals:   11/20/20 0600 11/20/20 0700 11/20/20 0725 11/20/20 0800  BP: (!) 155/54 (!) 145/56    Pulse: 65 65    Resp: (!) 22 (!) 22    Temp:   98.8 F (37.1 C)   TempSrc:   Oral   SpO2: 100% 100%  100%  Weight:      Height:       Physical Exam General:  Sedated, ill appearing Heart: RRR Lungs: slightly diminished air entry bibasilar, intubated Abdomen: soft, non-tender Extremities: lower legs wrapped Neuro: sedated Dialysis Access: LUE AVF + bruit  Additional Objective Labs: Basic Metabolic Panel: Recent Labs  Lab 11/18/20 0445 11/19/20 0142 11/20/20 0030  NA 135 136 134*  K 4.2 4.3 3.9  CL 98 98 97*  CO2 27 27 29   GLUCOSE 257* 161* 129*  BUN 34* 48* 29*  CREATININE 3.85* 4.88* 3.55*  CALCIUM 7.9* 8.5* 8.6*  PHOS 3.4 3.1 1.8*   Liver Function Tests: Recent Labs  Lab 11/18/20 0826 11/20/20 0030  ALBUMIN 2.5* 2.3*   No results for input(s): LIPASE, AMYLASE in the last 168 hours. CBC: Recent Labs  Lab 11/16/20 0013 11/17/20 0506 11/18/20 0445 11/19/20 0142 11/20/20 0030  WBC 5.9 7.2 9.1 10.1 10.9*  HGB 11.2* 11.2* 10.3* 10.2* 11.4*  HCT 36.3 35.6* 32.6* 32.9* 36.3  MCV 99.5 99.2 97.3 98.2 96.0  PLT 135* 148* 149* 192 242   CBG: Recent Labs  Lab 11/19/20 1910 11/19/20 2312 11/19/20 2327 11/20/20 0312 11/20/20 0723  GLUCAP 160* 62* 191* 128* 193*   Studies/Results: DG CHEST PORT 1 VIEW  Result Date: 11/19/2020 CLINICAL DATA:  Endotracheal and orogastric tube placement EXAM: PORTABLE CHEST 1 VIEW COMPARISON:  11/19/2018 FINDINGS: Endotracheal tube remains in position above the carina. Interval placement of esophagogastric tube, tip and side port below the diaphragm. Improved aeration of the right lung base. Otherwise unchanged examination with mild diffuse interstitial opacity and  cardiomegaly. IMPRESSION: 1. Interval placement of esophagogastric tube, tip and side port below the diaphragm. 2. Endotracheal tube remains in position above the carina. 3. Improved aeration of the right lung base. Otherwise unchanged examination with mild diffuse interstitial opacity and cardiomegaly. Electronically Signed   By: Eddie Candle M.D.   On: 11/19/2020 20:43   DG CHEST PORT 1 VIEW  Result Date: 11/18/2020 CLINICAL DATA:  Weakness, vomiting. EXAM: PORTABLE CHEST 1 VIEW COMPARISON:  November 16, 2020. FINDINGS: Stable cardiomegaly with central pulmonary vascular congestion. Endotracheal tube is in good position. Nasogastric tube has been removed. Mild bilateral pulmonary edema cannot be excluded. Right basilar opacity is noted concerning for effusion with associated atelectasis. Bony thorax is unremarkable. IMPRESSION: Stable cardiomegaly with central pulmonary vascular congestion and possible bilateral pulmonary edema. Right basilar opacity is noted concerning for effusion with associated atelectasis. Electronically Signed   By: Marijo Conception M.D.   On: 11/18/2020 16:59   DG Abd Portable 1V  Result Date: 11/19/2020 CLINICAL DATA:  Enteric catheter placement EXAM: PORTABLE ABDOMEN - 1 VIEW COMPARISON:  11/18/2020 FINDINGS: Frontal view of the lower chest and upper abdomen demonstrates tip and side port of an enteric catheter overlying the gastric body. Bowel gas pattern is unremarkable. IMPRESSION: 1. Enteric catheter projecting over gastric body. Electronically Signed   By: Randa Ngo M.D.   On: 11/19/2020 21:15   DG Abd Portable  1V  Result Date: 11/18/2020 CLINICAL DATA:  Enteric catheter placement EXAM: PORTABLE ABDOMEN - 1 VIEW COMPARISON:  11/15/2020 FINDINGS: Portable supine frontal view of the lower chest and upper abdomen was performed, excluding the right flank by collimation. Evaluation limited by portable technique and body habitus. Enteric catheter tip and side port project over  the gastric antrum. Unremarkable bowel gas pattern. IMPRESSION: 1. Enteric catheter projecting over gastric antrum. Electronically Signed   By: Randa Ngo M.D.   On: 11/18/2020 18:17   Medications: . sodium chloride Stopped (11/19/20 0401)  . dexmedetomidine (PRECEDEX) IV infusion 0.4 mcg/kg/hr (11/20/20 0700)  . feeding supplement (VITAL AF 1.2 CAL) 1,000 mL (11/20/20 0823)  . potassium PHOSPHATE IVPB (in mmol)     . aspirin  81 mg Per Tube Daily  . atorvastatin  40 mg Per Tube q1800  . docusate  100 mg Per Tube BID  . heparin  5,000 Units Subcutaneous Q8H  . insulin aspart  0-20 Units Subcutaneous Q4H  . insulin aspart  2 Units Subcutaneous Q4H  . insulin glargine  15 Units Subcutaneous Daily  . mouth rinse  15 mL Mouth Rinse 10 times per day  . midodrine  10 mg Per Tube Q T,Th,Sa-HD  . mirtazapine  7.5 mg Per Tube QHS  . multivitamin  1 tablet Per Tube QHS  . pantoprazole sodium  40 mg Per Tube Daily  . polyethylene glycol  17 g Per Tube Daily    OP HD: TTS [place] (last HD 4/2, prior to that last was 3/24)  4h  350/500  82kg  2/2 bath  P4  AVF  Hep none - Hectoral 31mcg IV q HD  Assessment/Plan: 1. Resp failure acute w/ hypoxia and hypercapnia - per CCM due to aspiration pneumonitis, possible OSA / OHS as well. ECHO showed pulm HTN, HFpEF, mod-severe MS. Vent per CCM. UF as tolerated with HD  2. Fevers: per primary, low threshold to start abx 3.  ESRD: maintain TTS scheduled here, next hd tomorrow 4.  HTN - BP's high but were dropping on HD. BP unaffected on 4/12 5.  Anemia of ESRD: no ESA need at this time, hgb at goal, monitor for now 6.  Secondary hyperparathyroidism/Metabolic bone disease: stopped binders, see #11 7.  T2DM: per primary 8.  PAD/leg wounds: ischemic/stasis dermatitis per WOC + primary 9.  CAD/HFrEF: UF as tolerated 10. Hyponatremia: stable/improved, managing with HD, 137Na bath, UF as tolerated 11. Hypophosphatemia: replete prn, not on any binders at the  moment   Gean Quint, MD [facility] 11/20/2020, 8:42 AM

## 2020-11-20 NOTE — Progress Notes (Signed)
Notified elink at 1936- Patient unarousable. BP 56/41. Shallow respirations on BIPAP. Family at bedside. Awaiting orders to proceed.

## 2020-11-20 NOTE — Progress Notes (Signed)
Pt extubated to Bipap. RN, and patients son were at bedside. Pt is able to talk at this time. BBS heard throughout. She does have lots of secretions that we are suctioning out with a yaunker.

## 2020-11-20 NOTE — Progress Notes (Signed)
[facility] Progress Note Patient Name: Kathleen [name] DOB: 12-21-1945 MRN: [id number]   Date of Service  11/20/2020  HPI/Events of Note  Patient with hypotension and somnolence, son [name] is in the room , he wants ongoing treatment but DNR /  DNI.  eICU Interventions  DNR / DNI order placed, Levophed ordered, NS 250 ml iv fluid bolus ordered, Narcan 0.4 mg iv x 1 ordered.        Kerry Kass Keisi Eckford 11/20/2020, 8:17 PM

## 2020-11-20 NOTE — Progress Notes (Signed)
Daily Progress Note   Patient Name: Kathleen [name]       Date: 11/20/2020 DOB: [birth year]  Age: 75 y.o. MRN#: [id number] Attending Physician: Jacky Kindle, MD Primary Care Physician: Benito Mccreedy, MD Admit Date: 11/08/2020  Reason for Consultation/Follow-up: Establishing goals of care  Subjective: AM: Medical records reviewed. Discussed with Dr. Sharon Seller and assessed patient at the bedside. She is intubated and alert. She will nod her head yes and no to answer closed-ended questions.   Kathleen [name] nods in agreement when asked if ETT causes her discomfort. She does not remember attempting to remove it overnight. I discussed the plan to extubate again today, letting her know that [name] is on his way for support. I discussed the risk of decompensation and potential need for ventilator support yet again. She nods her head yes when asked if she desire this. I then clarified that a failed extubation attempt would necessitate tracheostomy at this point. Discussed potential risks and benefits. She shakes her head no when asked if this would be acceptable to her. I then discussed options for comfort medications to treat pain and dyspnea if she decompensates, to which she shakes her head yes.   Kathleen [name] confirms that she has been communicating her wishes with her son [name] when he visits and she is not sedated. I share Kevin's expression that she would not want a trach and she nods her head in agreement. I informed her I would call him to offer support and she nods.  Called [name] to provide update on my conversation with patient. He states that he has been discussing with her as well and received the same response against trach. He will be coming to the hospital within the hour to see her. Encouraged  [name] to reach out to his siblings to visit as well given patient's high risk to decompensate and transition to comfort.   PM: Returned to the bedside to meet [name] in person and provide support. He tells me patient was complaining of left leg pain. He confirms the plan to proceed with liberation from ventilator soon. I offered to discuss comfort care in further detail to which he responds "she is fine." He becomes tearful and asks patient whether he should call other children to be present. Patient shakes her head no. I offered support in  honoring this wish and to inform the team that other family will not be present at patient's request. Offered to return to bedside for any additional needs or concerns during this difficult time. [name] voices his appreciation. Questions and concerns addressed.   Review of Systems  Unable to perform ROS: Intubated    Length of Stay: 9  Current Medications: Scheduled Meds:  . aspirin  81 mg Per Tube Daily  . atorvastatin  40 mg Per Tube q1800  . docusate  100 mg Per Tube BID  . heparin  5,000 Units Subcutaneous Q8H  . insulin aspart  0-20 Units Subcutaneous Q4H  . insulin glargine  15 Units Subcutaneous Daily  . mouth rinse  15 mL Mouth Rinse 10 times per day  . midodrine  10 mg Per Tube Q T,Th,Sa-HD  . mirtazapine  7.5 mg Per Tube QHS  . multivitamin  1 tablet Per Tube QHS  . pantoprazole sodium  40 mg Per Tube Daily  . polyethylene glycol  17 g Per Tube Daily    Continuous Infusions: . sodium chloride Stopped (11/19/20 0401)  . dexmedetomidine (PRECEDEX) IV infusion 0.4 mcg/kg/hr (11/20/20 0700)  . feeding supplement (VITAL AF 1.2 CAL) 1,000 mL (11/20/20 0823)  . potassium PHOSPHATE IVPB (in mmol)      PRN Meds: acetaminophen **OR** acetaminophen, fentaNYL (SUBLIMAZE) injection, fentaNYL (SUBLIMAZE) injection, hydrALAZINE, midazolam  Physical Exam Vitals and nursing note reviewed.  Constitutional:      Appearance: She is ill-appearing.      Interventions: She is intubated.  Cardiovascular:     Rate and Rhythm: Normal rate.  Pulmonary:     Effort: Tachypnea present. She is intubated.  Neurological:     Mental Status: She is alert.     Comments: Responsive.             Vital Signs: BP (!) 145/56   Pulse 65   Temp 98.8 F (37.1 C) (Oral)   Resp (!) 22   Ht 5\' 3"  (1.6 m)   Wt 83.5 kg   LMP  (LMP Unknown)   SpO2 100%   BMI 32.61 kg/m  SpO2: SpO2: 100 % O2 Device: O2 Device: Ventilator O2 Flow Rate: O2 Flow Rate (L/min): 6 L/min  Intake/output summary:   Intake/Output Summary (Last 24 hours) at 11/20/2020 0930 Last data filed at 11/20/2020 0700 Gross per 24 hour  Intake 1370.01 ml  Output 3500 ml  Net -2129.99 ml   LBM: Last BM Date: 11/20/20 Baseline Weight: Weight: 88.7 kg Most recent weight: Weight: 83.5 kg       Palliative Assessment/Data: PPS: 10%      Patient Active Problem List   Diagnosis Date Noted  . Endotracheal tube present   . Possible Pulmonary hypertension ([place]) 11/14/2020  . Mitral stenosis 11/14/2020  . Diastolic dysfunction 97/98/9211  . Acute hypercapnic respiratory failure ([place]) 11/14/2020  . Balance problems 11/13/2020  . Muscle weakness (generalized) 11/13/2020  . History of CVA (cerebrovascular accident) 11/13/2020  . Encounter for assessment of decision-making capacity 11/12/2020  . Agitation   . Chronic respiratory failure with hypercapnia ([place])   . Weakness   . ESRD (end stage renal disease) ([place]) 12/01/2020  . DM (diabetes mellitus), type 2 with renal complications ([place]) 94/17/4081  . Peripheral arterial disease ([place]) 02/23/2016  . Anemia in chronic kidney disease 12/18/2015  . Somnolence   . Hypertension associated with diabetes ([place])   . Unspecified vitamin D deficiency   . Chronic combined systolic and diastolic heart  failure ([place]) 02/12/2014  . CAD (coronary artery disease)   . Obesity   . Meningioma ([place])   . Hyperlipidemia associated with type 2 diabetes  mellitus ([place]) 01/22/2014    Palliative Care Assessment & Plan   Patient Profile: 75 y.o. female  with past medical history of on hemodialysis Tuesday Thursday Saturday, CHF with EF approximately 60% also with mitral stenosis, PAD, DM, CAD, HLD, HTN, admitted on 11/06/2020 with increasing weakness and nausea, reported she had missed dialysis for 2 weeks due to not feeling like going.  Admission has been complicated with respiratory acidosis, hypercapnea, she did not tolerate BiPAP-pulling it off.  On April 7 she had worsening confusion.  April 9 it was noted she likely has pulmonary hypertension she had worsening lethargy and vomiting and required intubation.  Continues to be ventilated with likely aspiration pneumonitis due to aspiration event.  She was hypotensive during dialysis.  Palliative medicine consulted for goals of care.    Assessment/Recommendations/Plan  Proceed with liberation from ventilator support today, understanding that patient declines tracheostomy - she is open to comfort care if she decompensates Psychosocial and emotional support provided. Patient's son [name] supports her decision against trach Clarified patient's wishes for family visitation during extubation and discussed with Dr. Sharon Seller PMT will continue to support holistically and provide assistance with comfort care/family education as desired  Goals of Care and Additional Recommendations: Limitations on Scope of Treatment: No Tracheostomy  Code Status: DNR  Prognosis:  Poor long-term prognosis given frailty, advanced age, multiple comorbidities  Discharge Planning: To Be Determined  Care plan was discussed with patient's family, [name] and [name].  Thank you for allowing the Palliative Medicine Team to assist in the care of this patient.   Total time: 28 minutes  Greater than 50% of this time was spent counseling and coordinating care related to the above assessment and plan.  Dorthy Cooler,  PA-C Palliative Medicine Team Team phone # [phone]  Thank you for allowing the Palliative Medicine Team to assist in the care of this patient. Please utilize secure chat with additional questions, if there is no response within 30 minutes please call the above phone number.  Palliative Medicine Team providers are available by phone from 7am to 7pm daily and can be reached through the team cell phone.  Should this patient require assistance outside of these hours, please call the patient's attending physician.

## 2020-11-20 NOTE — Progress Notes (Signed)
PT Cancellation Note  Patient Details Name: Kathleen [name] MRN: [id number] DOB: November 09, 1945   Cancelled Treatment:    Reason Eval/Treat Not Completed: Patient not medically ready. Order received post extubation. Will defer eval until we see how she does off the vent. Will continue to monitor for appropriateness.   Shary Decamp [facility] 11/20/2020, 5:09 PM [place] Pager [phone] Office [phone]

## 2020-11-20 NOTE — Progress Notes (Signed)
NAME:  Kathleen [name], MRN:  [id number], DOB:  1945-12-27, LOS: 3 ADMISSION DATE:  11/06/2020, CONSULTATION DATE:  4/9 REFERRING MD:  Dr. McDiarmid, CHIEF COMPLAINT:  Pulmonary hypertension  History of Present Illness:  75 year old female with PMH as below, which is significant for ESRD, HFpEF, CVA, DM, CAD and tobacco abuse. She presented to [facility] ED 4/5 with complaints of progressive weakness x 2 week and has missed HD for the entire week prior to presentation. No other specific symptoms at the time of admission. In the emergency department she was found to have hypercarbic respiratory failure and was started on BiPAP. It was felt that with HD she would improve. She largely did improve with HD, despite difficulty using NIMV nocturnally.  Echocardiogram was done as part of the workup and showed markedly elevated pulmonary artery systolic pressure of 51-88%. PCCM was consulted for new diagnosis of pulmonary hypertension.   Later that evening she had vomiting episodes and developed somnolence. She was found to be hypercapneic and was moved to the ICU for intubation and mechanical ventilation.  Pertinent  Medical History   has a past medical history of Acute CVA (cerebrovascular accident) ([place]) (11/08/2017), Acute respiratory failure with hypercapnia ([place]), Acute respiratory failure with hypoxia ([place]) (01/29/2019), Altered mental status (07/14/2017), Anemia, Arthritis, Asthma, Benign neoplasm of ascending colon, Benign neoplasm of cecum, Benign neoplasm of sigmoid colon, BPPV (benign paroxysmal positional vertigo) (01/22/2014), CAD (coronary artery disease), CVA (cerebral vascular accident) ([place]) (11/09/2017), Diabetes mellitus without complication ([place]), Disorientation, ESRD (end stage renal disease) ([place]), Gastritis and gastroduodenitis, Hematochezia, History of echocardiogram, transfusion, Hyperlipidemia, Hypernatremia, Hypertension, Hypertensive urgency (01/21/2014), Meningioma ([place]), Noncompliance with  medication regimen, Obesity, Pleural effusion, left, Pulmonary edema (01/29/2019), Pulmonary hypertension due to sleep-disordered breathing ([place]) (11/14/2020), Pulmonary vascular congestion, Rectal bleeding (09/01/2018), Renal insufficiency, Syncope and collapse (41/01/6062), Systolic and diastolic CHF, acute ([place]), Systolic CHF ([place]), and Tobacco abuse (01/21/2014).   Significant Hospital Events: Including procedures, antibiotic start and stop dates in addition to other pertinent events   . 4/4 admit  . 4/8 Echo LVEF 60-65%. RV systolic function normal. Mod/sev mitral stenosis, mod TR.  . 4/11 mains on vent  . 4/13 extubated . 4/14 reintubated   Interim History / Subjective:  Attempted to pule ETT out over night, pulled out OG.  Intubated, sedated on vent.   Objective   Blood pressure (!) 145/56, pulse 65, temperature 98.8 F (37.1 C), temperature source Oral, resp. rate (!) 22, height 5\' 3"  (1.6 m), weight 83.5 kg, SpO2 100 %.    Vent Mode: PRVC FiO2 (%):  [40 %] 40 % Set Rate:  [22 bmp] 22 bmp Vt Set:  [420 mL] 420 mL PEEP:  [5 cmH20] 5 cmH20 Plateau Pressure:  [18 cmH20-32 cmH20] 18 cmH20   Intake/Output Summary (Last 24 hours) at 11/20/2020 0747 Last data filed at 11/20/2020 0700 Gross per 24 hour  Intake 1450.6 ml  Output 3500 ml  Net -2049.4 ml   Filed Weights   11/19/20 1400 11/19/20 1757 11/20/20 0500  Weight: 86.6 kg 83 kg 83.5 kg    Examination:  General: Elderly female, critically ill intubated, sedated, wearing mittens  HENT: NCAT, endotracheal tube in place, increased upper airway secretions  Lungs: Bilateral mechanically ventilated breaths, CTA  Cardiovascular: Regular rate rhythm, S1-S2 Abdomen: Obese, soft, nontender, nondistended, NTTP  Extremities: Bilateral legs wrapped Neuro: sedated, moves to voice, not following commands   Labs/imaging that I have personally reviewed  (right click and "Reselect all  SmartList Selections" daily)  ECHO 4/8: LVEF 60-65%. RV  systolic function normal. Mod/sev mitral stenosis, mod TR.  Resolved Hospital Problem list     Assessment & Plan:   Acute respiratory failure with hypoxia and hypercapnia requiring MV due to aspiration pneumonitis. Extubated 4/13, required reintubation same day due to secretions and concern for aspiration. CXR 4/15 improved from prior, decreased diffuse opacity after HD.  P: - cont. Adult mechanical vent protocol - plan for extubation again today  - VAP prophylaxis - PAD guidelines for sedation - Daily SAT SBT   [facility] Palliative consulted, appreciate assistance and ongoing family discussion. Patient made DNR with preference for reintubation with respiratory failure. Discussed possible extubation again today and that she would need a tracheostomy if failed again. He does not think she would want this, but recommends discussing further with Stefany. With age and multiple comorbidities, he understands she would may not do well long term with trach.  - discussed with palliative and they will discuss further with Ayan.   Pulmonary hypertension on TEE 4/8. RVSP ~ 79.6. Hx concerning for group 2 disease in the setting of known hypertension, HFpEF, and valvular heart disease (mod to severe mitral stenosis). Also concern for group 3 disease considering likely undiagnosed OSA/OHS. Doubt cteph but can get work up in outpatient. Acute on chronic diastolic heart failure  Probable OSA, at risk for OHS Moderate to severe MS HTN P:  Management with HD Will need outpatient PSG, likely referral to Dr. Silas Flood for pH evaluation and management.  TIIDM - Tube feeds continued - cont. lantus 20U, cont. ssi  - q2h novolog   ESRD, hypervolemia from dialysis noncompliance Hypocalcemia Hypophosphatemia  - repleting  -IHD per nephrology  History of CVA/ CAD -Aspirin plus statin  Best practice (right click and "Reselect all SmartList Selections" daily)  Diet:  Tube Feed  Pain/Anxiety/Delirium  protocol (if indicated): Yes (RASS goal 0) VAP protocol (if indicated): Yes DVT prophylaxis: Subcutaneous Heparin GI prophylaxis: N/A and PPI Glucose control:  SSI Yes Central venous access:  N/A Arterial line:  N/A Foley:  N/A Mobility:  bed rest  PT consulted: N/A Last date of multidisciplinary goals of care discussion: Son [name] updated 4/15 Code Status:  full code Disposition: ICU  Labs   CBC: Recent Labs  Lab 11/16/20 0013 11/17/20 0506 11/18/20 0445 11/19/20 0142 11/20/20 0030  WBC 5.9 7.2 9.1 10.1 10.9*  HGB 11.2* 11.2* 10.3* 10.2* 11.4*  HCT 36.3 35.6* 32.6* 32.9* 36.3  MCV 99.5 99.2 97.3 98.2 96.0  PLT 135* 148* 149* 192 258    Basic Metabolic Panel: Recent Labs  Lab 11/15/20 0107 11/15/20 0211 11/16/20 0013 11/17/20 0506 11/17/20 2106 11/18/20 0445 11/19/20 0142 11/20/20 0030  NA 135   < > 132* 132*  --  135 136 134*  K 4.4   < > 3.8 3.9  --  4.2 4.3 3.9  CL 96*  --  98 95*  --  98 98 97*  CO2 26  --  27 25  --  27 27 29   GLUCOSE 84  --  288* 273*  --  257* 161* 129*  BUN 15  --  34* 53*  --  34* 48* 29*  CREATININE 4.05*  --  5.14* 6.05*  --  3.85* 4.88* 3.55*  CALCIUM 9.8  --  8.9 9.1  --  7.9* 8.5* 8.6*  MG 2.2  --  1.9 2.0  --  1.7 1.9  --   PHOS 5.1*  --  1.5* <1.0* 1.6* 3.4 3.1 1.8*   < > = values in this interval not displayed.   GFR: Estimated Creatinine Clearance: 13.8 mL/min (A) (by C-G formula based on SCr of 3.55 mg/dL (H)). Recent Labs  Lab 11/15/20 0108 11/16/20 0013 11/17/20 0506 11/18/20 0445 11/18/20 0826 11/19/20 0142 11/20/20 0030  PROCALCITON  --   --   --   --  2.95  --   --   WBC  --    < > 7.2 9.1  --  10.1 10.9*  LATICACIDVEN 1.0  --   --   --   --   --   --    < > = values in this interval not displayed.   Molli Hazard A, DO 11/20/2020, 7:47 AM Pager: [phone]

## 2020-11-20 NOTE — Progress Notes (Signed)
SLP Note  Patient Details Name: Kathleen [name] MRN: [id number] DOB: 06/18/1946           Pt extubated around 59; Bipap now, morphine given and question of comfort care. ST will discontinue order at this time. Please reconsult if needed.    Kathleen [name] 11/20/2020, 3:45 PM   Kathleen [name].Ed Risk analyst [phone] Office [phone]

## 2020-11-20 NOTE — Progress Notes (Signed)
Significant decrease in mental status post extubation this afternoon. RN notified on call MD at 1600 that patient was becoming less and less responsive. MD came to bedside and said to continue watching patient, no orders received. RN again notified MD at 1900 that patient had not awoken since 1600, breathing very irregular and that RN felt patient needed to be on comfort care. MD instructed RN to allow patient to continue in that state unless patient's oxygen dropped.

## 2020-11-20 NOTE — Progress Notes (Signed)
Fio2 and tidal volume were changed as documented due to decreased Spo2, and decreased alertness.

## 2020-11-20 NOTE — Progress Notes (Signed)
Inpatient Diabetes Program Recommendations  AACE/ADA: New Consensus Statement on Inpatient Glycemic Control   Target Ranges:  Prepandial:   less than 140 mg/dL      Peak postprandial:   less than 180 mg/dL (1-2 hours)      Critically ill patients:  140 - 180 mg/dL   Results for GLENICE, CICCONE (MRN [id number]) as of 11/20/2020 08:12  Ref. Range 11/19/2020 07:15 11/19/2020 10:02 11/19/2020 11:09 11/19/2020 15:29 11/19/2020 19:10 11/19/2020 23:12 11/19/2020 23:27 11/20/2020 03:12 11/20/2020 07:23  Glucose-Capillary Latest Ref Range: 70 - 99 mg/dL 142 (H)     Lantus 15 units 169 (H)  Novolog 4 units 171 (H)  Novolog 4 units 160 (H)  Novolog 4 units 62 (L) 191 (H) 128 (H)  Novolog 3 units 193 (H)  Novolog 4 units   Review of Glycemic Control  Diabetes history: DM2 Outpatient Diabetes medications: Basaglar 30 units QHS Current orders for Inpatient glycemic control:Lantus 15 units daily, Novolog 0-20 units Q4H; Vital @ 60 ml/hr  Inpatient Diabetes Program Recommendations:  Insulin: Please consider ordering Novolog2units Q4H for tube feeding coverage. If tube feeding is stopped or held then Novolog tube feeding coverage should also be stopped or held.  Thanks, Barnie Alderman, RN, MSN, CDE Diabetes Coordinator Inpatient Diabetes Program [phone] (Team Pager from 8am to 5pm)

## 2020-11-21 DIAGNOSIS — N186 End stage renal disease: Secondary | ICD-10-CM | POA: Diagnosis not present

## 2020-11-21 DIAGNOSIS — I5189 Other ill-defined heart diseases: Secondary | ICD-10-CM | POA: Diagnosis not present

## 2020-11-21 DIAGNOSIS — E1121 Type 2 diabetes mellitus with diabetic nephropathy: Secondary | ICD-10-CM | POA: Diagnosis not present

## 2020-11-21 DIAGNOSIS — J9601 Acute respiratory failure with hypoxia: Secondary | ICD-10-CM | POA: Diagnosis not present

## 2020-11-21 LAB — GLUCOSE, CAPILLARY
Glucose-Capillary: 125 mg/dL — ABNORMAL HIGH (ref 70–99)
Glucose-Capillary: 129 mg/dL — ABNORMAL HIGH (ref 70–99)
Glucose-Capillary: 133 mg/dL — ABNORMAL HIGH (ref 70–99)
Glucose-Capillary: 154 mg/dL — ABNORMAL HIGH (ref 70–99)
Glucose-Capillary: 65 mg/dL — ABNORMAL LOW (ref 70–99)
Glucose-Capillary: 72 mg/dL (ref 70–99)
Glucose-Capillary: 99 mg/dL (ref 70–99)

## 2020-11-21 LAB — CBC
HCT: 36 % (ref 36.0–46.0)
Hemoglobin: 10.6 g/dL — ABNORMAL LOW (ref 12.0–15.0)
MCH: 30.5 pg (ref 26.0–34.0)
MCHC: 29.4 g/dL — ABNORMAL LOW (ref 30.0–36.0)
MCV: 103.4 fL — ABNORMAL HIGH (ref 80.0–100.0)
Platelets: 269 10*3/uL (ref 150–400)
RBC: 3.48 MIL/uL — ABNORMAL LOW (ref 3.87–5.11)
RDW: 14.5 % (ref 11.5–15.5)
WBC: 14.2 10*3/uL — ABNORMAL HIGH (ref 4.0–10.5)
nRBC: 0 % (ref 0.0–0.2)

## 2020-11-21 LAB — RENAL FUNCTION PANEL
Albumin: 2.5 g/dL — ABNORMAL LOW (ref 3.5–5.0)
Anion gap: 12 (ref 5–15)
BUN: 46 mg/dL — ABNORMAL HIGH (ref 8–23)
CO2: 27 mmol/L (ref 22–32)
Calcium: 8.8 mg/dL — ABNORMAL LOW (ref 8.9–10.3)
Chloride: 98 mmol/L (ref 98–111)
Creatinine, Ser: 4.92 mg/dL — ABNORMAL HIGH (ref 0.44–1.00)
GFR, Estimated: 9 mL/min — ABNORMAL LOW (ref >60)
Glucose, Bld: 162 mg/dL — ABNORMAL HIGH (ref 70–99)
Phosphorus: 8.1 mg/dL — ABNORMAL HIGH (ref 2.5–4.6)
Potassium: 5.9 mmol/L — ABNORMAL HIGH (ref 3.5–5.1)
Sodium: 137 mmol/L (ref 135–145)

## 2020-11-21 MED ORDER — ORAL CARE MOUTH RINSE
15.0000 mL | Freq: Two times a day (BID) | OROMUCOSAL | Status: DC
  Administered 2020-11-22 – 2020-11-25 (×8): 15 mL via OROMUCOSAL

## 2020-11-21 MED ORDER — DEXTROSE 50 % IV SOLN
INTRAVENOUS | Status: AC
  Administered 2020-11-21: 25 mL
  Filled 2020-11-21: qty 50

## 2020-11-21 MED ORDER — SODIUM ZIRCONIUM CYCLOSILICATE 10 G PO PACK: 10.0000 g | PACK | Freq: Once | ORAL | Status: DC

## 2020-11-21 MED ORDER — HYDROMORPHONE HCL 1 MG/ML IJ SOLN
0.5000 mg | INTRAMUSCULAR | Status: DC | PRN
  Administered 2020-11-21 – 2020-11-25 (×4): 0.5 mg via INTRAVENOUS
  Filled 2020-11-21 (×4): qty 0.5

## 2020-11-21 MED ORDER — DEXTROSE 10 % IV SOLN: INTRAVENOUS | Status: DC

## 2020-11-21 MED ORDER — GLYCOPYRROLATE 0.2 MG/ML IJ SOLN
0.1000 mg | INTRAMUSCULAR | Status: DC | PRN
  Administered 2020-11-22 – 2020-11-25 (×2): 0.1 mg via INTRAVENOUS
  Filled 2020-11-21 (×2): qty 1

## 2020-11-21 NOTE — Progress Notes (Signed)
Elink notified- patient unresponsive to stimuli and does not speak.  Several episodes of desaturation events have occurred throughout the night.  Patient on 100% fiO2 on BIPAP.  MD- Dr. Ruthann Cancer has also assessed patient at beside and has had further discussions with family.

## 2020-11-21 NOTE — Progress Notes (Signed)
NAME:  Kathleen [name], MRN:  [id number], DOB:  01-02-1946, LOS: 51 ADMISSION DATE:  12/03/2020, CONSULTATION DATE:  4/9 REFERRING MD:  Dr. McDiarmid, CHIEF COMPLAINT:  Pulmonary hypertension  History of Present Illness:  75 year old female with PMH as below, which is significant for ESRD, HFpEF, CVA, DM, CAD and tobacco abuse. She presented to [facility] ED 4/5 with complaints of progressive weakness x 2 week and has missed HD for the entire week prior to presentation. No other specific symptoms at the time of admission. In the emergency department she was found to have hypercarbic respiratory failure and was started on BiPAP. It was felt that with HD she would improve. She largely did improve with HD, despite difficulty using NIMV nocturnally.  Echocardiogram was done as part of the workup and showed markedly elevated pulmonary artery systolic pressure of 27-03%. PCCM was consulted for new diagnosis of pulmonary hypertension.   Later that evening she had vomiting episodes and developed somnolence. She was found to be hypercapneic and was moved to the ICU for intubation and mechanical ventilation.   Significant Hospital Events: Including procedures, antibiotic start and stop dates in addition to other pertinent events   . 4/4 admit  . 4/8 Echo LVEF 60-65%. RV systolic function normal. Mod/sev mitral stenosis, mod TR.  . 4/11 mains on vent  . 4/13 extubated . 4/14 reintubated  . 4/15 one-way extubation  Interim History / Subjective:  Patient tolerated pressure support trial, but due to secretions, remain high risk for aspiration Spoke with patient's family, they decided to do one-way extubation to see if she tolerates, recommend do not reintubate if fails She has been struggling on BiPAP overnight, received 1 dose of ropinirole with improvement in secretions but mental status remain poor Became hypotensive, was started on low-dose Levophed  Objective   Blood pressure (!) 91/43, pulse 76,  temperature 98.7 F (37.1 C), temperature source Axillary, resp. rate (!) 29, height 5\' 3"  (1.6 m), weight 85.3 kg, SpO2 98 %.    FiO2 (%):  [40 %-60 %] 40 %   Intake/Output Summary (Last 24 hours) at 11/21/2020 1051 Last data filed at 11/21/2020 0700 Gross per 24 hour  Intake 900.14 ml  Output 0 ml  Net 900.14 ml   Filed Weights   11/19/20 1757 11/20/20 0500 11/21/20 0515  Weight: 83 kg 83.5 kg 85.3 kg    Examination:  General: Elderly female, critically ill on BiPAP HENT: Atraumatic, normocephalic, moist mucous membranes currently on BiPAP Lungs: Bilateral crackles all over, no wheezes Cardiovascular: Regular rate rhythm, S1-S2, no murmur Abdomen: Obese, soft, nontender, nondistended, NTTP  Extremities: Bilateral legs wrapped Neuro: Lethargic but opens eyes with vocal stimuli  Labs/imaging that I have personally reviewed   ECHO 4/8: LVEF 60-65%. RV systolic function normal. Mod/sev mitral stenosis, mod TR.  Resolved Hospital Problem list     Assessment & Plan:   Acute respiratory failure with hypoxia and hypercapnia Patient required MV due to aspiration pneumonitis. Extubated 4/13, required reintubation same day due to secretions and concern for aspiration. CXR 4/15 improved from prior, decreased diffuse opacity after HD.  She tolerated pressure support trial, after discussion with family she was extubated with intention to not reintubate as patient's family does not want her to struggle on ventilator or get tracheostomy Post extubation patient had issues with secretions, received 1 dose of Robinul Put on BiPAP to improve work of breathing  Spoke with patient's daughter, unable to reach son, advised to come  for family meeting as patient has been struggling and goal was to keep her comfortable if she fails extubation  Pulmonary hypertension on TEE 4/8. RVSP ~ 79.6. Hx concerning for group 2 disease in the setting of known hypertension, HFpEF, and valvular heart disease (mod to  severe mitral stenosis). Also concern for group 3 disease considering likely undiagnosed OSA/OHS. Doubt cteph but can get work up in outpatient. Acute on chronic diastolic heart failure  Probable OSA, at risk for OHS Moderate to severe MS HTN  Management with HD Will need outpatient PSG, likely referral to Dr. Silas Flood for pH evaluation and management.  TIIDM Hold tube feeds Monitor fingerstick, continue sliding scale insulin  ESRD, hypervolemia from dialysis noncompliance Hypocalcemia Hypophosphatemia  Nephrology is following, awaiting family discussion before proceeding with HD session today  History of CVA/ CAD Continue aspirin and statin  Acute metabolic encephalopathy Patient mental status is poor She is unable to protect her airway, remain DNR/DNI Avoid sedation  Best practice (right click and "Reselect all SmartList Selections" daily)  Diet: NPO Pain/Anxiety/Delirium protocol (if indicated): N/A VAP protocol (if indicated): N/A DVT prophylaxis: Subcutaneous Heparin GI prophylaxis: N/A and PPI Glucose control:  SSI Yes Central venous access:  N/A Arterial line:  N/A Foley:  N/A Mobility:  bed rest  PT consulted: N/A Last date of multidisciplinary goals of care discussion: Patient's daughter was updated over the phone on 4/16 Code Status:  full code Disposition: ICU  Labs   CBC: Recent Labs  Lab 11/17/20 0506 11/18/20 0445 11/19/20 0142 11/20/20 0030 11/21/20 0055  WBC 7.2 9.1 10.1 10.9* 14.2*  HGB 11.2* 10.3* 10.2* 11.4* 10.6*  HCT 35.6* 32.6* 32.9* 36.3 36.0  MCV 99.2 97.3 98.2 96.0 103.4*  PLT 148* 149* 192 242 440    Basic Metabolic Panel: Recent Labs  Lab 11/15/20 0107 11/15/20 0211 11/16/20 0013 11/17/20 0506 11/17/20 2106 11/18/20 0445 11/19/20 0142 11/20/20 0030 11/21/20 0055  NA 135   < > 132* 132*  --  135 136 134* 137  K 4.4   < > 3.8 3.9  --  4.2 4.3 3.9 5.9*  CL 96*  --  98 95*  --  98 98 97* 98  CO2 26  --  27 25  --  27 27  29 27   GLUCOSE 84  --  288* 273*  --  257* 161* 129* 162*  BUN 15  --  34* 53*  --  34* 48* 29* 46*  CREATININE 4.05*  --  5.14* 6.05*  --  3.85* 4.88* 3.55* 4.92*  CALCIUM 9.8  --  8.9 9.1  --  7.9* 8.5* 8.6* 8.8*  MG 2.2  --  1.9 2.0  --  1.7 1.9  --   --   PHOS 5.1*  --  1.5* <1.0* 1.6* 3.4 3.1 1.8* 8.1*   < > = values in this interval not displayed.   GFR: Estimated Creatinine Clearance: 10.1 mL/min (A) (by C-G formula based on SCr of 4.92 mg/dL (H)). Recent Labs  Lab 11/15/20 0108 11/16/20 0013 11/18/20 0445 11/18/20 0826 11/19/20 0142 11/20/20 0030 11/21/20 0055  PROCALCITON  --   --   --  2.95  --   --   --   WBC  --    < > 9.1  --  10.1 10.9* 14.2*  LATICACIDVEN 1.0  --   --   --   --   --   --    < > = values in this  interval not displayed.   Total critical care time: 41 minutes  Performed by: [place] care time was exclusive of separately billable procedures and treating other patients.   Critical care was necessary to treat or prevent imminent or life-threatening deterioration.   Critical care was time spent personally by me on the following activities: development of treatment plan with patient and/or surrogate as well as nursing, discussions with consultants, evaluation of patient's response to treatment, examination of patient, obtaining history from patient or surrogate, ordering and performing treatments and interventions, ordering and review of laboratory studies, ordering and review of radiographic studies, pulse oximetry and re-evaluation of patient's condition.   Jacky Kindle MD [facility] See Amion for pager If no response to pager, please call [phone] until 7pm After 7pm, Please call E-link [phone]

## 2020-11-21 NOTE — Progress Notes (Signed)
SLP Cancellation Note  Patient Details Name: CANNA NICKELSON MRN: [id number] DOB: 1945/09/26   Cancelled treatment:        Attempted to see pt for swallowing evaluation. Pt requiring BiPAP at this time and is not medically appropriate for swallow evaluation.  SLP will follow for readiness for PO trials.    Celedonio Savage , MA, [place] Office: [phone]; Pager [phone]): [phone] 11/21/2020, 10:36 AM

## 2020-11-21 NOTE — Progress Notes (Signed)
Daily Progress Note   Patient Name: Kathleen [name]       Date: 11/21/2020 DOB: 11/09/1945  Age: 75 y.o. MRN#: [id number] Attending Physician: Jacky Kindle, MD Primary Care Physician: Benito Mccreedy, MD Admit Date: 11/11/2020  Reason for Consultation/Follow-up: Establishing goals of care  Subjective: Medical records reviewed. Discussed with RN Marjory Lies and assessed patient at the bedside. She is intubated and alert, however increasingly lethargic. She will nod her head yes and no to answer closed-ended questions. Patient's daughter [name] is at the bedside.  [name] is quite emotional having visiting for the past few hours and witnessing patient's respiratory distress. Therapeutic listening provided as family members called to support her and voice their love for the patient as well. We then discussed patient's high risk for decompensation and the difficulty of treating dyspnea effectively while still continuing medical interventions such as Levophed. We discussed comfort care and she understands this option is available if patient continues to decline. [name] is awaiting her brother Kevin's arrival as "he's the strong one" and the siblings rely on his leadership. She is uncertain whether [name] will be able to visit.    I asked the patient if she desired a small dose of dilaudid for dyspnea, knowing the risk of hypotension. She nodded her head yes. Monitored patient and vitals for several minutes after administration by RN. Work of breathing decreases mildly and patient confirms some relief after a few minutes. I explained to patient that this medication could be given steadily for relief of her suffering and inquired whether she is ready to remove the BiPAP. Patient shakes her head no. I clarified  whether patient is agreeable to repeating as needed doses while continuing BiPAP and she gives a thumb up and confirmatory nod. We discussed patient's overall weakness and anticipated dialysis today, depending on family's decision to proceed with aggressive interventions. Patient shakes her head no and [name] agrees this would be too much today.  Discussed the importance of these ongoing conversations with patient and her daughter. Family is grateful for the few responses that patient is able to give. We discussed patient's faith and reflected on God's plans for her. Patient again gives a thumbs up to the statement "only God knows what will happen."  I then called patient's son [name] to provide update on the conversation and  patient's permission to give dilaudid. [name] voices his appreciation that his mother is being asked these important questions and participating in her care as able. He continues to support her wishes and will be returning to the bedside as soon as he can. He leaves [place] at [employer] today.  Questions and concerns addressed. PMT will continue to support holistically.      Review of Systems  Unable to perform ROS: Critical illness    Length of Stay: 10  Current Medications: Scheduled Meds:  . aspirin  81 mg Per Tube Daily  . atorvastatin  40 mg Per Tube q1800  . docusate  100 mg Per Tube BID  . heparin  5,000 Units Subcutaneous Q8H  . insulin aspart  0-9 Units Subcutaneous Q4H  . insulin glargine  10 Units Subcutaneous Daily  . mouth rinse  15 mL Mouth Rinse q12n4p  . midodrine  10 mg Per Tube Q T,Th,Sa-HD  . multivitamin  1 tablet Per Tube QHS  . pantoprazole sodium  40 mg Per Tube Daily  . polyethylene glycol  17 g Per Tube Daily  . scopolamine  1 patch Transdermal Q72H  . sodium zirconium cyclosilicate  10 g Oral Once    Continuous Infusions: . sodium chloride Stopped (11/19/20 0401)  . sodium chloride Stopped (11/21/20 0327)  . feeding supplement (VITAL AF 1.2  CAL) 1,000 mL (11/20/20 0823)  . norepinephrine (LEVOPHED) Adult infusion 3 mcg/min (11/21/20 1100)    PRN Meds: acetaminophen **OR** acetaminophen, glycopyrrolate, hydrALAZINE, HYDROmorphone (DILAUDID) injection, naLOXone (NARCAN)  injection  Physical Exam Vitals and nursing note reviewed.  Constitutional:      Appearance: She is ill-appearing.     Comments: BiPAP  Cardiovascular:     Rate and Rhythm: Normal rate.  Pulmonary:     Effort: Tachypnea present.  Neurological:     Mental Status: She is lethargic.     Comments: Responsive.             Vital Signs: BP (!) 99/51   Pulse 82   Temp 99.6 F (37.6 C) (Axillary)   Resp (!) 25   Ht 5\' 3"  (1.6 m)   Wt 85.3 kg   LMP  (LMP Unknown)   SpO2 98%   BMI 33.31 kg/m  SpO2: SpO2: 98 % O2 Device: O2 Device: Bi-PAP O2 Flow Rate: O2 Flow Rate (L/min): 6 L/min  Intake/output summary:   Intake/Output Summary (Last 24 hours) at 11/21/2020 1356 Last data filed at 11/21/2020 1100 Gross per 24 hour  Intake 496.52 ml  Output 0 ml  Net 496.52 ml   LBM: Last BM Date: 11/21/20 Baseline Weight: Weight: 88.7 kg Most recent weight: Weight: 85.3 kg       Palliative Assessment/Data: PPS: 10%      Patient Active Problem List   Diagnosis Date Noted  . On mechanically assisted ventilation ([place])   . Endotracheal tube present   . Possible Pulmonary hypertension ([place]) 11/14/2020  . Mitral stenosis 11/14/2020  . Diastolic dysfunction 32/95/1884  . Acute hypercapnic respiratory failure ([place]) 11/14/2020  . Balance problems 11/13/2020  . Muscle weakness (generalized) 11/13/2020  . History of CVA (cerebrovascular accident) 11/13/2020  . Encounter for assessment of decision-making capacity 11/12/2020  . Agitation   . Chronic respiratory failure with hypercapnia ([place])   . Weakness   . ESRD (end stage renal disease) ([place]) 11/24/2020  . DM (diabetes mellitus), type 2 with renal complications ([place]) 16/60/6301  . Peripheral arterial  disease ([place]) 02/23/2016  . Anemia  in chronic kidney disease 12/18/2015  . Goals of care, counseling/discussion   . Somnolence   . Hypertension associated with diabetes ([place])   . Unspecified vitamin D deficiency   . Chronic combined systolic and diastolic heart failure ([place]) 02/12/2014  . CAD (coronary artery disease)   . Obesity   . Meningioma ([place])   . Hyperlipidemia associated with type 2 diabetes mellitus ([place]) 01/22/2014    Palliative Care Assessment & Plan   Patient Profile: 75 y.o. female  with past medical history of on hemodialysis Tuesday Thursday Saturday, CHF with EF approximately 60% also with mitral stenosis, PAD, DM, CAD, HLD, HTN, admitted on 11/06/2020 with increasing weakness and nausea, reported she had missed dialysis for 2 weeks due to not feeling like going.  Admission has been complicated with respiratory acidosis, hypercapnea, she did not tolerate BiPAP-pulling it off.  On April 7 she had worsening confusion.  April 9 it was noted she likely has pulmonary hypertension she had worsening lethargy and vomiting and required intubation.  Continues to be ventilated with likely aspiration pneumonitis due to aspiration event.  She was hypotensive during dialysis.  Palliative medicine consulted for goals of care.    Assessment/Recommendations/Plan  Provided psychosocial and emotional support for daughter [name] Provided education on comfort care; daughter defers any decisions to patient and patient's son Janyth Pupa dilaudid 0.5mg  Q2H PRN for pain/dyspnea Ordered robinul 0.1mg  Q4H PRN for excessive secretions Patient declines dialysis today Discussed with son [name] via phone  Discussed the above with Dr. Tacy Learn  PMT will continue to support holistically  Goals of Care and Additional Recommendations: Limitations on Scope of Treatment: No Tracheostomy  Code Status: DNR  Prognosis:  Poor long-term prognosis given frailty, advanced age, multiple comorbidities and  continued decline s/p one-way extubation  Discharge Planning: To Be Determined  Care plan was discussed with RN Marjory Lies, Dr. Tacy Learn, patient, and patient's family, [name] and [place].  Thank you for allowing the Palliative Medicine Team to assist in the care of this patient.   Total time: 43 minutes  Greater than 50% of this time was spent counseling and coordinating care related to the above assessment and plan.  Dorthy Cooler, PA-C Palliative Medicine Team Team phone # [phone]  Thank you for allowing the Palliative Medicine Team to assist in the care of this patient. Please utilize secure chat with additional questions, if there is no response within 30 minutes please call the above phone number.  Palliative Medicine Team providers are available by phone from 7am to 7pm daily and can be reached through the team cell phone.  Should this patient require assistance outside of these hours, please call the patient's attending physician.

## 2020-11-21 NOTE — Progress Notes (Signed)
Cross covering ICU physician.   Called to see pt with sats at <80% on 80% NIV. Poorly responsive, but goals of care discussion revealed decision for one way extubation.   Increased fio2 to 100% and will monitor closely.   Did call to update family about her mental status and hypoxia. They expressed understanding and provided reiteration that we were not to be aggressive with life prolonging measures. They understand she may expire if her oxygen does not improve.   D/w RN

## 2020-11-21 NOTE — Progress Notes (Signed)
Physical Therapy Discharge Patient Details Name: Kathleen [name] MRN: [id number] DOB: 03/10/1946 Today's Date: 11/21/2020 Time:  -     Patient discharged from PT services secondary to medical decline - will need to re-order PT to resume therapy services.  Please see latest therapy progress note for current level of functioning and progress toward goals.    Progress and discharge plan discussed with patient and/or caregiver: Patient unable to participate in discharge planning and no caregivers available  GP     Arby Barrette, PT Pager [phone]   [name] 11/21/2020, 1:44 PM

## 2020-11-21 NOTE — Progress Notes (Signed)
Hypoglycemic Event  CBG: 4/16 1503-65  Treatment: D50 25 mL (12.5 gm) and Started D10 at 37ml/hr  Symptoms: Shaky  Follow-up CBG: Time:1532 CBG Result:129  Possible Reasons for Event: Inadequate meal intake  Comments/MD notified:Dr. Tacy Learn notified    Irven [place]

## 2020-11-21 NOTE — Progress Notes (Signed)
[facility] Progress Note   Subjective:  Seen in ICU.  Extubated yesterday to BiPAP.  Mentation has been poor.  Has been hypotensive and just came off Levophed per staff.  She received 250 cc of normal saline as well overnight as well as Narcan.  She is slightly more awake this a.m., opens her eyes.  Objective Vitals:   11/21/20 0600 11/21/20 0615 11/21/20 0630 11/21/20 0731  BP: 114/60 (!) 112/51 (!) 101/48   Pulse: 94 96 91   Resp: (!) 21 (!) 24 (!) 29   Temp:    98.7 F (37.1 C)  TempSrc:    Axillary  SpO2: 97% 99% 100%   Weight:      Height:       Physical Exam General:  ill appearing, drowsy Heart: RRR Lungs: On BiPAP, diminished air entry bibasilar Abdomen: soft, non-tender Extremities: lower legs wrapped Neuro: Drowsy but opens eyes on vocal stimuli, nods or shakes her head upon questioning Dialysis Access: LUE AVF + bruit  Additional Objective Labs: Basic Metabolic Panel: Recent Labs  Lab 11/19/20 0142 11/20/20 0030 11/21/20 0055  NA 136 134* 137  K 4.3 3.9 5.9*  CL 98 97* 98  CO2 27 29 27   GLUCOSE 161* 129* 162*  BUN 48* 29* 46*  CREATININE 4.88* 3.55* 4.92*  CALCIUM 8.5* 8.6* 8.8*  PHOS 3.1 1.8* 8.1*   Liver Function Tests: Recent Labs  Lab 11/18/20 0826 11/20/20 0030 11/21/20 0055  ALBUMIN 2.5* 2.3* 2.5*   No results for input(s): LIPASE, AMYLASE in the last 168 hours. CBC: Recent Labs  Lab 11/17/20 0506 11/18/20 0445 11/19/20 0142 11/20/20 0030 11/21/20 0055  WBC 7.2 9.1 10.1 10.9* 14.2*  HGB 11.2* 10.3* 10.2* 11.4* 10.6*  HCT 35.6* 32.6* 32.9* 36.3 36.0  MCV 99.2 97.3 98.2 96.0 103.4*  PLT 148* 149* 192 242 269   CBG: Recent Labs  Lab 11/20/20 1509 11/20/20 2044 11/20/20 2352 11/21/20 0400 11/21/20 0716  GLUCAP 226* 178* 157* 125* 99   Studies/Results: DG CHEST PORT 1 VIEW  Result Date: 11/19/2020 CLINICAL DATA:  Endotracheal and orogastric tube placement EXAM: PORTABLE CHEST 1 VIEW COMPARISON:  11/19/2018  FINDINGS: Endotracheal tube remains in position above the carina. Interval placement of esophagogastric tube, tip and side port below the diaphragm. Improved aeration of the right lung base. Otherwise unchanged examination with mild diffuse interstitial opacity and cardiomegaly. IMPRESSION: 1. Interval placement of esophagogastric tube, tip and side port below the diaphragm. 2. Endotracheal tube remains in position above the carina. 3. Improved aeration of the right lung base. Otherwise unchanged examination with mild diffuse interstitial opacity and cardiomegaly. Electronically Signed   By: Eddie Candle M.D.   On: 11/19/2020 20:43   DG Abd Portable 1V  Result Date: 11/19/2020 CLINICAL DATA:  Enteric catheter placement EXAM: PORTABLE ABDOMEN - 1 VIEW COMPARISON:  11/18/2020 FINDINGS: Frontal view of the lower chest and upper abdomen demonstrates tip and side port of an enteric catheter overlying the gastric body. Bowel gas pattern is unremarkable. IMPRESSION: 1. Enteric catheter projecting over gastric body. Electronically Signed   By: Randa Ngo M.D.   On: 11/19/2020 21:15   Medications: . sodium chloride Stopped (11/19/20 0401)  . sodium chloride Stopped (11/21/20 0327)  . dexmedetomidine (PRECEDEX) IV infusion Stopped (11/20/20 0856)  . feeding supplement (VITAL AF 1.2 CAL) 1,000 mL (11/20/20 0823)  . norepinephrine (LEVOPHED) Adult infusion Stopped (11/21/20 0549)   . aspirin  81 mg Per Tube Daily  . atorvastatin  40 mg Per Tube q1800  . docusate  100 mg Per Tube BID  . heparin  5,000 Units Subcutaneous Q8H  . insulin aspart  0-9 Units Subcutaneous Q4H  . insulin glargine  10 Units Subcutaneous Daily  . mouth rinse  15 mL Mouth Rinse q12n4p  . midodrine  10 mg Per Tube Q T,Th,Sa-HD  . mirtazapine  7.5 mg Per Tube QHS  . multivitamin  1 tablet Per Tube QHS  . pantoprazole sodium  40 mg Per Tube Daily  . polyethylene glycol  17 g Per Tube Daily  . scopolamine  1 patch Transdermal Q72H   . sodium zirconium cyclosilicate  10 g Oral Once    OP HD: TTS [place] (last HD 4/2, prior to that last was 3/24)  4h  350/500  82kg  2/2 bath  P4  AVF  Hep none - Hectoral 70mcg IV q HD  Assessment/Plan: 1. Resp failure acute w/ hypoxia and hypercapnia - per CCM due to aspiration pneumonitis, possible OSA / OHS as well. ECHO showed pulm HTN, HFpEF, mod-severe MS. Mgmt per CCM. Extubated again 4/15. UF as tolerated with HD  2. Fevers: afebrile. per primary, low threshold to start abx 3.  ESRD: maintain TTS scheduled here, will be difficulty UF on HD today but will attempt.  She cannot get any midodrine due to her dependency on BiPAP/n.p.o.  Today's orders have been adjusted, will aim for 1 to 2 L of ultrafiltration as tolerated. Before considering CRRT (if it comes to this), would recommend having a discussion with palliative care first as placing her on CRRT may be a step backwards for her from a crit care aspect 4.  h/o HTN, now hypotensive - just got off levophed, monitor closely 5.  Anemia of ESRD: no ESA need at this time, hgb at goal, monitor for now 6.  Secondary hyperparathyroidism/Metabolic bone disease: stopped binders, see #11 7.  T2DM: per primary 8.  PAD/leg wounds: ischemic/stasis dermatitis per WOC + primary 9.  CAD/HFrEF: UF as tolerated 10. Hyponatremia: stable/improved, managing with HD, 137Na bath, UF as tolerated 11. Hypophosphatemia: replete prn, not on any binders at the moment 12. Hyperkalemia: k 5.9, could not receive lokelma, attempt hd today   Gean Quint, MD [place] 11/21/2020, 7:37 AM

## 2020-11-21 NOTE — Progress Notes (Signed)
OT Cancellation and discharge Note  Patient Details Name: Kathleen [name] MRN: [id number] DOB: 26-Jan-1946   Cancelled Treatment:    Reason Eval/Treat Not Completed: Medical issues which prohibited therapy.  Pt with decreased responsiveness, and on BiPAP for respiratory distress.  If pt improves and is able to engage in therapies, please reorder.  OT will sign off at this time.  Nilsa Nutting., OTR/L Acute Rehabilitation Services Pager [phone] Office [phone]   Lucille Passy M 11/21/2020, 1:50 PM

## 2020-11-22 DIAGNOSIS — R451 Restlessness and agitation: Secondary | ICD-10-CM | POA: Diagnosis not present

## 2020-11-22 DIAGNOSIS — J9601 Acute respiratory failure with hypoxia: Secondary | ICD-10-CM | POA: Diagnosis not present

## 2020-11-22 DIAGNOSIS — R400 Somnolence: Secondary | ICD-10-CM | POA: Diagnosis not present

## 2020-11-22 DIAGNOSIS — N186 End stage renal disease: Secondary | ICD-10-CM | POA: Diagnosis not present

## 2020-11-22 DIAGNOSIS — Z7189 Other specified counseling: Secondary | ICD-10-CM | POA: Diagnosis not present

## 2020-11-22 DIAGNOSIS — J9602 Acute respiratory failure with hypercapnia: Secondary | ICD-10-CM | POA: Diagnosis not present

## 2020-11-22 LAB — GLUCOSE, CAPILLARY
Glucose-Capillary: 100 mg/dL — ABNORMAL HIGH (ref 70–99)
Glucose-Capillary: 128 mg/dL — ABNORMAL HIGH (ref 70–99)
Glucose-Capillary: 139 mg/dL — ABNORMAL HIGH (ref 70–99)
Glucose-Capillary: 150 mg/dL — ABNORMAL HIGH (ref 70–99)
Glucose-Capillary: 182 mg/dL — ABNORMAL HIGH (ref 70–99)
Glucose-Capillary: 110 mg/dL — ABNORMAL HIGH (ref 70–99)

## 2020-11-22 LAB — CBC
HCT: 39.1 % (ref 36.0–46.0)
Hemoglobin: 11.1 g/dL — ABNORMAL LOW (ref 12.0–15.0)
MCH: 30.7 pg (ref 26.0–34.0)
MCHC: 28.4 g/dL — ABNORMAL LOW (ref 30.0–36.0)
MCV: 108 fL — ABNORMAL HIGH (ref 80.0–100.0)
Platelets: 285 10*3/uL (ref 150–400)
RBC: 3.62 MIL/uL — ABNORMAL LOW (ref 3.87–5.11)
RDW: 14.5 % (ref 11.5–15.5)
WBC: 13.8 10*3/uL — ABNORMAL HIGH (ref 4.0–10.5)
nRBC: 0 % (ref 0.0–0.2)

## 2020-11-22 LAB — RENAL FUNCTION PANEL
Albumin: 2.6 g/dL — ABNORMAL LOW (ref 3.5–5.0)
Anion gap: 14 (ref 5–15)
BUN: 16 mg/dL (ref 8–23)
CO2: 27 mmol/L (ref 22–32)
Calcium: 8.4 mg/dL — ABNORMAL LOW (ref 8.9–10.3)
Chloride: 94 mmol/L — ABNORMAL LOW (ref 98–111)
Creatinine, Ser: 2.55 mg/dL — ABNORMAL HIGH (ref 0.44–1.00)
GFR, Estimated: 19 mL/min — ABNORMAL LOW (ref >60)
Glucose, Bld: 127 mg/dL — ABNORMAL HIGH (ref 70–99)
Phosphorus: 3.8 mg/dL (ref 2.5–4.6)
Potassium: 3.4 mmol/L — ABNORMAL LOW (ref 3.5–5.1)
Sodium: 135 mmol/L (ref 135–145)

## 2020-11-22 NOTE — Progress Notes (Signed)
[facility] Progress Note Patient Name: Kathleen [name] DOB: March 28, 1946 MRN: [id number]   Date of Service  11/22/2020  HPI/Events of Note  Saturation previously in the mid-80's but it is now back up to 96 %, Dialysis is in progress.  eICU Interventions  No intervention at this time, will monitor patient for a recurrence of desaturation.        [name] 11/22/2020, 1:07 AM

## 2020-11-22 NOTE — Progress Notes (Signed)
SLP Cancellation Note  Patient Details Name: Kathleen [name] MRN: [id number] DOB: 05-23-1946   Cancelled treatment:       Reason Eval/Treat Not Completed: Patient not medically ready (Pt was briefly changed from BiPAP to NRB, but it was then replaced. SLP will follow up on subsequent date unless contacted sooner by staff due to improvement in pt's status.)  Negin Hegg I. Hardin Negus, [place], [place] Office number [phone] Pager [phone]  Horton Marshall 11/22/2020, 10:56 AM

## 2020-11-22 NOTE — Progress Notes (Signed)
Daily Progress Note   Patient Name: Kathleen [name]       Date: 11/22/2020 DOB: Dec 06, 1945  Age: 75 y.o. MRN#: [id number] Attending Physician: Jacky Kindle, MD Primary Care Physician: Benito Mccreedy, MD Admit Date: 11/07/2020  Reason for Consultation/Follow-up: Establishing goals of care  Subjective: AM: Medical records reviewed. Discussed with RN Jacqulyn Bath and assessed patient at the bedside. Amy RT is present at the bedside during trial of NRB. Patient confirms that she finds relief with removal of BiPAP. She nods yes when asked if she is feeling any better today. Per RN, patient's son will be visiting this afternoon.  PM: Upon returning to the bedside, patient is very weak and reaching towards BiPAP, confirming uncomfortable tightness with a nod. Patient's son Kathleen [name] is at the bedside.  We discussed Kevin's processing of patient's recent hypotensive and hypoxic episodes. He speaks highly of the education he has received over the past week and the care his mother has received. We discussed last night's dialysis and patient's decreasing ability to tolerate previous net filtration.  Provided update that patient was unable to tolerate NRB for very long today. Kathleen [name] remains hopeful that the patient will have successful trials off BiPAP in the future. His decision is to continue with current interventions at this time. Family has decided against reintubation, trach, PEG.  Questions and concerns addressed. PMT will continue to support holistically.      Review of Systems  Unable to perform ROS: Critical illness    Length of Stay: 11  Current Medications: Scheduled Meds:  . aspirin  81 mg Per Tube Daily  . atorvastatin  40 mg Per Tube q1800  . docusate  100 mg Per Tube BID  . heparin  5,000  Units Subcutaneous Q8H  . insulin aspart  0-9 Units Subcutaneous Q4H  . insulin glargine  10 Units Subcutaneous Daily  . mouth rinse  15 mL Mouth Rinse q12n4p  . midodrine  10 mg Per Tube Q T,Th,Sa-HD  . multivitamin  1 tablet Per Tube QHS  . pantoprazole sodium  40 mg Per Tube Daily  . polyethylene glycol  17 g Per Tube Daily  . scopolamine  1 patch Transdermal Q72H    Continuous Infusions: . sodium chloride Stopped (11/19/20 0401)  . sodium chloride Stopped (11/21/20 0327)  . dextrose 30 mL/hr  at 11/22/20 1302  . feeding supplement (VITAL AF 1.2 CAL) 1,000 mL (11/20/20 0823)  . norepinephrine (LEVOPHED) Adult infusion 7 mcg/min (11/22/20 1302)    PRN Meds: acetaminophen **OR** acetaminophen, glycopyrrolate, hydrALAZINE, HYDROmorphone (DILAUDID) injection, naLOXone (NARCAN)  injection  Physical Exam Vitals and nursing note reviewed.  Constitutional:      Appearance: She is ill-appearing.     Comments: BiPAP  Cardiovascular:     Rate and Rhythm: Tachycardia present.  Pulmonary:     Effort: Tachypnea present.  Neurological:     Mental Status: She is lethargic.     Comments: Responsive.             Vital Signs: BP 115/85   Pulse (!) 102   Temp 100.2 F (37.9 C) (Axillary)   Resp (!) 24   Ht 5\' 3"  (1.6 m)   Wt 85.3 kg   LMP  (LMP Unknown)   SpO2 100%   BMI 33.31 kg/m  SpO2: SpO2: 100 % O2 Device: O2 Device: Bi-PAP O2 Flow Rate: O2 Flow Rate (L/min): 15 L/min  Intake/output summary:   Intake/Output Summary (Last 24 hours) at 11/22/2020 1416 Last data filed at 11/22/2020 1302 Gross per 24 hour  Intake 796.13 ml  Output 1673 ml  Net -876.87 ml   LBM: Last BM Date: 11/21/20 Baseline Weight: Weight: 88.7 kg Most recent weight: Weight: 85.3 kg       Palliative Assessment/Data: PPS: 10%      Patient Active Problem List   Diagnosis Date Noted  . On mechanically assisted ventilation ([place])   . Endotracheal tube present   . Possible Pulmonary hypertension  ([place]) 11/14/2020  . Mitral stenosis 11/14/2020  . Diastolic dysfunction 42/59/5638  . Acute hypercapnic respiratory failure ([place]) 11/14/2020  . Balance problems 11/13/2020  . Muscle weakness (generalized) 11/13/2020  . History of CVA (cerebrovascular accident) 11/13/2020  . Encounter for assessment of decision-making capacity 11/12/2020  . Agitation   . Chronic respiratory failure with hypercapnia ([place])   . Weakness   . ESRD (end stage renal disease) ([place]) 11/26/2020  . DM (diabetes mellitus), type 2 with renal complications ([place]) 75/64/3329  . Peripheral arterial disease ([place]) 02/23/2016  . Anemia in chronic kidney disease 12/18/2015  . Goals of care, counseling/discussion   . Somnolence   . Hypertension associated with diabetes ([place])   . Unspecified vitamin D deficiency   . Chronic combined systolic and diastolic heart failure ([place]) 02/12/2014  . CAD (coronary artery disease)   . Obesity   . Meningioma ([place])   . Hyperlipidemia associated with type 2 diabetes mellitus ([place]) 01/22/2014    Palliative Care Assessment & Plan   Patient Profile: 75 y.o. female  with past medical history of on hemodialysis Tuesday Thursday Saturday, CHF with EF approximately 60% also with mitral stenosis, PAD, DM, CAD, HLD, HTN, admitted on 11/09/2020 with increasing weakness and nausea, reported she had missed dialysis for 2 weeks due to not feeling like going.  Admission has been complicated with respiratory acidosis, hypercapnea, she did not tolerate BiPAP-pulling it off.  On April 7 she had worsening confusion.  April 9 it was noted she likely has pulmonary hypertension she had worsening lethargy and vomiting and required intubation.  Continues to be ventilated with likely aspiration pneumonitis due to aspiration event.  She was hypotensive during dialysis.  Palliative medicine consulted for goals of care.    Assessment/Recommendations/Plan  Provided psychosocial and emotional support for son  Kathleen [name] Ongoing goal of care discussions, continue current  interventions at this time Discussed with Dr. Tacy Learn  Discussed with Amy RT, appreciate her assistance with BiPAP adjustments for comfort PMT will continue to support holistically  Goals of Care and Additional Recommendations: Limitations on Scope of Treatment: No Tracheostomy ; Full Scope  Code Status: DNR  Prognosis:  Poor long-term prognosis given frailty, advanced age, multiple comorbidities and continued decline s/p one-way extubation  Discharge Planning: To Be Determined   Total time: 15 minutes  Greater than 50% of this time was spent in counseling and coordinating care related to the above assessment and plan.  Dorthy Cooler, PA-C Palliative Medicine Team Team phone # [phone]  Thank you for allowing the Palliative Medicine Team to assist in the care of this patient. Please utilize secure chat with additional questions, if there is no response within 30 minutes please call the above phone number.  Palliative Medicine Team providers are available by phone from 7am to 7pm daily and can be reached through the team cell phone.  Should this patient require assistance outside of these hours, please call the patient's attending physician.

## 2020-11-22 NOTE — Progress Notes (Signed)
[facility] Progress Note   Subjective:  Seen in ICU.  Intermittently requiring levo. Hd overnight, net uf 1673cc. Mentation worsening, barely responsive  Objective Vitals:   11/22/20 0545 11/22/20 0615 11/22/20 0645 11/22/20 0715  BP: (!) 118/101 (!) 105/94 119/71 (!) 109/96  Pulse: 94 92 98 89  Resp: (!) 22 (!) 22 (!) 21 (!) 23  Temp:      TempSrc:      SpO2: 100% 100% 100% 100%  Weight:      Height:       Physical Exam General:  ill appearing, drowsy Heart: RRR Lungs: On BiPAP, diminished air entry bibasilar Abdomen: soft, non-tender Extremities: lower legs wrapped Neuro: Drowsy, opening eyes intermittently Dialysis Access: LUE AVF + bruit  Additional Objective Labs: Basic Metabolic Panel: Recent Labs  Lab 11/20/20 0030 11/21/20 0055 11/22/20 0400  NA 134* 137 135  K 3.9 5.9* 3.4*  CL 97* 98 94*  CO2 29 27 27   GLUCOSE 129* 162* 127*  BUN 29* 46* 16  CREATININE 3.55* 4.92* 2.55*  CALCIUM 8.6* 8.8* 8.4*  PHOS 1.8* 8.1* 3.8   Liver Function Tests: Recent Labs  Lab 11/20/20 0030 11/21/20 0055 11/22/20 0400  ALBUMIN 2.3* 2.5* 2.6*   No results for input(s): LIPASE, AMYLASE in the last 168 hours. CBC: Recent Labs  Lab 11/18/20 0445 11/19/20 0142 11/20/20 0030 11/21/20 0055 11/22/20 0400  WBC 9.1 10.1 10.9* 14.2* 13.8*  HGB 10.3* 10.2* 11.4* 10.6* 11.1*  HCT 32.6* 32.9* 36.3 36.0 39.1  MCV 97.3 98.2 96.0 103.4* 108.0*  PLT 149* 192 242 269 285   CBG: Recent Labs  Lab 11/21/20 1503 11/21/20 1532 11/21/20 1952 11/21/20 2352 11/22/20 0258  GLUCAP 65* 129* 154* 133* 100*   Studies/Results: No results found. Medications: . sodium chloride Stopped (11/19/20 0401)  . sodium chloride Stopped (11/21/20 0327)  . dextrose 30 mL/hr at 11/22/20 0700  . feeding supplement (VITAL AF 1.2 CAL) 1,000 mL (11/20/20 0823)  . norepinephrine (LEVOPHED) Adult infusion Stopped (11/22/20 0534)   . aspirin  81 mg Per Tube Daily  . atorvastatin   40 mg Per Tube q1800  . docusate  100 mg Per Tube BID  . heparin  5,000 Units Subcutaneous Q8H  . insulin aspart  0-9 Units Subcutaneous Q4H  . insulin glargine  10 Units Subcutaneous Daily  . mouth rinse  15 mL Mouth Rinse q12n4p  . midodrine  10 mg Per Tube Q T,Th,Sa-HD  . multivitamin  1 tablet Per Tube QHS  . pantoprazole sodium  40 mg Per Tube Daily  . polyethylene glycol  17 g Per Tube Daily  . scopolamine  1 patch Transdermal Q72H    OP HD: TTS [place] (last HD 4/2, prior to that last was 3/24)  4h  350/500  82kg  2/2 bath  P4  AVF  Hep none - Hectoral 35mcg IV q HD  Assessment/Plan: 1. Resp failure acute w/ hypoxia and hypercapnia - per CCM due to aspiration pneumonitis, possible OSA / OHS as well. ECHO showed pulm HTN, HFpEF, mod-severe MS. Mgmt per CCM. Extubated again 4/15. UF as tolerated with HD  2.  ESRD: maintain TTS scheduled here, will be difficulty UF on HD today but will attempt.  She cannot get any midodrine due to her dependency on BiPAP/n.p.o. Next HD 4/18 if proceeding with full scope of treatment, appreciate palliative care's assistance 3.  h/o HTN, now hypotensive - intermittently requiring levo for for bp support , monitor closely 4.  Anemia of ESRD: no ESA need at this time, hgb at/around goal, monitor for now 5.  Secondary hyperparathyroidism/Metabolic bone disease: stopped binders, see #11 6.  T2DM: per primary 7.  PAD/leg wounds: ischemic/stasis dermatitis per WOC + primary 8.  CAD/HFrEF: UF as tolerated 10. Hyponatremia: stable/improved, managing with HD, 137Na bath, UF as tolerated 11. Hypophosphatemia: replete prn, not on any binders at the moment 12. Hyperkalemia: resolved with hd   Gean Quint, MD [facility] 11/22/2020, 7:26 AM

## 2020-11-22 NOTE — Progress Notes (Signed)
NAME:  Kathleen [name], MRN:  [id number], DOB:  [birth year], LOS: 10 ADMISSION DATE:  11/23/2020, CONSULTATION DATE:  4/9 REFERRING MD:  Dr. McDiarmid, CHIEF COMPLAINT:  Pulmonary hypertension  History of Present Illness:  75 year old female with PMH as below, which is significant for ESRD, HFpEF, CVA, DM, CAD and tobacco abuse. She presented to [facility] ED 4/5 with complaints of progressive weakness x 2 week and has missed HD for the entire week prior to presentation. No other specific symptoms at the time of admission. In the emergency department she was found to have hypercarbic respiratory failure and was started on BiPAP. It was felt that with HD she would improve. She largely did improve with HD, despite difficulty using NIMV nocturnally.  Echocardiogram was done as part of the workup and showed markedly elevated pulmonary artery systolic pressure of 40-98%. PCCM was consulted for new diagnosis of pulmonary hypertension.   Later that evening she had vomiting episodes and developed somnolence. She was found to be hypercapneic and was moved to the ICU for intubation and mechanical ventilation.   Significant Hospital Events: Including procedures, antibiotic start and stop dates in addition to other pertinent events   . 4/4 admit  . 4/8 Echo LVEF 60-65%. RV systolic function normal. Mod/sev mitral stenosis, mod TR.  . 4/11 mains on vent  . 4/13 extubated . 4/14 reintubated  . 4/15 one-way extubation  Interim History / Subjective:  Patient remained on BiPAP, could not come off of it because of hypoxia and tachypnea. Palliative care saw the patient and had discussion with family, they would like to have family meeting again today  Objective   Blood pressure 115/85, pulse (!) 102, temperature 100.2 F (37.9 C), temperature source Axillary, resp. rate (!) 24, height 5\' 3"  (1.6 m), weight 85.3 kg, SpO2 100 %.    FiO2 (%):  [80 %-100 %] 90 %   Intake/Output Summary (Last 24 hours) at  11/22/2020 1411 Last data filed at 11/22/2020 1302 Gross per 24 hour  Intake 796.13 ml  Output 1673 ml  Net -876.87 ml   Filed Weights   11/19/20 1757 11/20/20 0500 11/21/20 0515  Weight: 83 kg 83.5 kg 85.3 kg    Examination:  General: Elderly female, critically ill on BiPAP HENT: Atraumatic, normocephalic, moist mucous membranes currently on BiPAP Lungs: Bilateral crackles all over, no wheezes Cardiovascular: Tachycardic, regular rhythm, S1-S2, no murmur Abdomen: Obese, soft, nontender, nondistended, NTTP  Extremities: Bilateral legs wrapped Neuro: Lethargic but opens eyes with vocal stimuli  Labs/imaging that I have personally reviewed   ECHO 4/8: LVEF 60-65%. RV systolic function normal. Mod/sev mitral stenosis, mod TR.  Resolved Hospital Problem list     Assessment & Plan:   Acute respiratory failure with hypoxia and hypercapnia Patient required MV due to aspiration pneumonitis. Extubated 4/13, required reintubation same day due to secretions and concern for aspiration. CXR 4/15 improved from prior, decreased diffuse opacity after HD.  Patient remained on BiPAP, unable to come off of it because of hypoxia and tachypnea and increased work of breathing Ongoing goals of care discussion with family  Pulmonary hypertension on TEE 4/8. RVSP ~ 79.6. Hx concerning for group 2 disease in the setting of known hypertension, HFpEF, and valvular heart disease (mod to severe mitral stenosis). Also concern for group 3 disease considering likely undiagnosed OSA/OHS. Doubt cteph but can get work up in outpatient. Acute on chronic diastolic heart failure  Probable OSA, at risk for OHS Moderate to  severe MS HTN  Management with HD Will need outpatient PSG, likely referral to Dr. Silas Flood for pH evaluation and management.  TIIDM Hold tube feeds Patient became hypoglycemic Continue low-dose D5 W water Monitor CBGs  ESRD, hypervolemia from dialysis  noncompliance Hypocalcemia Hypophosphatemia  Nephrology is following Patient was dialyzed last night Next HD is due on Tuesday unless family decides to proceed with comfort care  History of CVA/ CAD Continue aspirin and statin  Acute metabolic encephalopathy Patient mental status slightly better She is generalized weak Avoid sedation  Best practice (right click and "Reselect all SmartList Selections" daily)  Diet: NPO Pain/Anxiety/Delirium protocol (if indicated): N/A VAP protocol (if indicated): N/A DVT prophylaxis: Subcutaneous Heparin GI prophylaxis: N/A and PPI Glucose control:  SSI Yes Central venous access:  N/A Arterial line:  N/A Foley:  N/A Mobility:  bed rest  PT consulted: N/A Last date of multidisciplinary goals of care discussion: Goals of care meeting is due between palliative care and patient's son today Code Status: DNR/DNI Disposition: ICU  Labs   CBC: Recent Labs  Lab 11/18/20 0445 11/19/20 0142 11/20/20 0030 11/21/20 0055 11/22/20 0400  WBC 9.1 10.1 10.9* 14.2* 13.8*  HGB 10.3* 10.2* 11.4* 10.6* 11.1*  HCT 32.6* 32.9* 36.3 36.0 39.1  MCV 97.3 98.2 96.0 103.4* 108.0*  PLT 149* 192 242 269 833    Basic Metabolic Panel: Recent Labs  Lab 11/16/20 0013 11/17/20 0506 11/17/20 2106 11/18/20 0445 11/19/20 0142 11/20/20 0030 11/21/20 0055 11/22/20 0400  NA 132* 132*  --  135 136 134* 137 135  K 3.8 3.9  --  4.2 4.3 3.9 5.9* 3.4*  CL 98 95*  --  98 98 97* 98 94*  CO2 27 25  --  27 27 29 27 27   GLUCOSE 288* 273*  --  257* 161* 129* 162* 127*  BUN 34* 53*  --  34* 48* 29* 46* 16  CREATININE 5.14* 6.05*  --  3.85* 4.88* 3.55* 4.92* 2.55*  CALCIUM 8.9 9.1  --  7.9* 8.5* 8.6* 8.8* 8.4*  MG 1.9 2.0  --  1.7 1.9  --   --   --   PHOS 1.5* <1.0*   < > 3.4 3.1 1.8* 8.1* 3.8   < > = values in this interval not displayed.   GFR: Estimated Creatinine Clearance: 19.4 mL/min (A) (by C-G formula based on SCr of 2.55 mg/dL (H)). Recent Labs  Lab  11/18/20 0826 11/19/20 0142 11/20/20 0030 11/21/20 0055 11/22/20 0400  PROCALCITON 2.95  --   --   --   --   WBC  --  10.1 10.9* 14.2* 13.8*   Total critical care time: 35 minutes  Performed by: [place] care time was exclusive of separately billable procedures and treating other patients.   Critical care was necessary to treat or prevent imminent or life-threatening deterioration.   Critical care was time spent personally by me on the following activities: development of treatment plan with patient and/or surrogate as well as nursing, discussions with consultants, evaluation of patient's response to treatment, examination of patient, obtaining history from patient or surrogate, ordering and performing treatments and interventions, ordering and review of laboratory studies, ordering and review of radiographic studies, pulse oximetry and re-evaluation of patient's condition.   Jacky Kindle MD [facility] See Amion for pager If no response to pager, please call [phone] until 7pm After 7pm, Please call E-link [phone]

## 2020-11-22 NOTE — Progress Notes (Signed)
RT NOTE: Per CCM RT placed bipap on standby and placed patient on NRB mask. Patient coughing up copious amount of thick secretions which RT suctioned with yankeur. Vitals are stable with sats at 98%. PT tolerating at this time. RT will continue to monitor.

## 2020-11-23 ENCOUNTER — Ambulatory Visit: Payer: Medicare HMO | Admitting: Cardiovascular Disease

## 2020-11-23 DIAGNOSIS — N186 End stage renal disease: Secondary | ICD-10-CM | POA: Diagnosis not present

## 2020-11-23 LAB — CBC
HCT: 37.7 % (ref 36.0–46.0)
Hemoglobin: 11 g/dL — ABNORMAL LOW (ref 12.0–15.0)
MCH: 30.7 pg (ref 26.0–34.0)
MCHC: 29.2 g/dL — ABNORMAL LOW (ref 30.0–36.0)
MCV: 105.3 fL — ABNORMAL HIGH (ref 80.0–100.0)
Platelets: 227 10*3/uL (ref 150–400)
RBC: 3.58 MIL/uL — ABNORMAL LOW (ref 3.87–5.11)
RDW: 14.2 % (ref 11.5–15.5)
WBC: 19.4 10*3/uL — ABNORMAL HIGH (ref 4.0–10.5)
nRBC: 0.1 % (ref 0.0–0.2)

## 2020-11-23 LAB — GLUCOSE, CAPILLARY
Glucose-Capillary: 129 mg/dL — ABNORMAL HIGH (ref 70–99)
Glucose-Capillary: 149 mg/dL — ABNORMAL HIGH (ref 70–99)
Glucose-Capillary: 156 mg/dL — ABNORMAL HIGH (ref 70–99)
Glucose-Capillary: 183 mg/dL — ABNORMAL HIGH (ref 70–99)
Glucose-Capillary: 178 mg/dL — ABNORMAL HIGH (ref 70–99)
Glucose-Capillary: 158 mg/dL — ABNORMAL HIGH (ref 70–99)

## 2020-11-23 LAB — RENAL FUNCTION PANEL
Albumin: 2.6 g/dL — ABNORMAL LOW (ref 3.5–5.0)
Anion gap: 12 (ref 5–15)
BUN: 27 mg/dL — ABNORMAL HIGH (ref 8–23)
CO2: 28 mmol/L (ref 22–32)
Calcium: 8.8 mg/dL — ABNORMAL LOW (ref 8.9–10.3)
Chloride: 92 mmol/L — ABNORMAL LOW (ref 98–111)
Creatinine, Ser: 4.24 mg/dL — ABNORMAL HIGH (ref 0.44–1.00)
GFR, Estimated: 10 mL/min — ABNORMAL LOW (ref >60)
Glucose, Bld: 125 mg/dL — ABNORMAL HIGH (ref 70–99)
Phosphorus: 5.8 mg/dL — ABNORMAL HIGH (ref 2.5–4.6)
Potassium: 4.6 mmol/L (ref 3.5–5.1)
Sodium: 132 mmol/L — ABNORMAL LOW (ref 135–145)

## 2020-11-23 MED ORDER — MIDODRINE HCL 5 MG PO TABS
5.0000 mg | ORAL_TABLET | Freq: Three times a day (TID) | ORAL | Status: DC
  Administered 2020-11-23 – 2020-11-24 (×5): 5 mg
  Filled 2020-11-23 (×5): qty 1

## 2020-11-23 MED ORDER — WHITE PETROLATUM EX OINT
TOPICAL_OINTMENT | CUTANEOUS | Status: AC
  Administered 2020-11-23: 0.2
  Filled 2020-11-23: qty 28.35

## 2020-11-23 MED ORDER — NEPRO/CARBSTEADY PO LIQD
1000.0000 mL | ORAL | Status: DC
  Administered 2020-11-23 – 2020-11-24 (×2): 1000 mL
  Filled 2020-11-23 (×4): qty 1000

## 2020-11-23 MED ORDER — PROSOURCE TF PO LIQD
45.0000 mL | Freq: Three times a day (TID) | ORAL | Status: DC
  Administered 2020-11-23 – 2020-11-25 (×7): 45 mL
  Filled 2020-11-23 (×7): qty 45

## 2020-11-23 MED ORDER — CALCIUM ACETATE (PHOS BINDER) 667 MG/5ML PO SOLN
667.0000 mg | Freq: Three times a day (TID) | ORAL | Status: DC
  Administered 2020-11-23 (×2): 667 mg
  Filled 2020-11-23 (×4): qty 5

## 2020-11-23 NOTE — Progress Notes (Signed)
[facility] Progress Note   Subjective:  Seen in ICU.  More alert today and interacting some, lots of wet coughing. NG in place.   Objective Vitals:   11/23/20 1000 11/23/20 1100 11/23/20 1111 11/23/20 1132  BP: 132/82 (!) 88/47    Pulse: 92 82    Resp:      Temp:    98.9 F (37.2 C)  TempSrc:    Axillary  SpO2:   100%   Weight:      Height:       Physical Exam General: elderly, frail, awake but difficult to understand her verbalizations Heart: RRR Lungs: On BiPAP, diminished air entry bibasilar Abdomen: soft, non-tender Extremities: lower legs wrapped Neuro: Drowsy, opening eyes intermittently Dialysis Access: LUE AVF + bruit  Additional Objective Labs: Basic Metabolic Panel: Recent Labs  Lab 11/21/20 0055 11/22/20 0400 11/23/20 0118  NA 137 135 132*  K 5.9* 3.4* 4.6  CL 98 94* 92*  CO2 27 27 28   GLUCOSE 162* 127* 125*  BUN 46* 16 27*  CREATININE 4.92* 2.55* 4.24*  CALCIUM 8.8* 8.4* 8.8*  PHOS 8.1* 3.8 5.8*   Liver Function Tests: Recent Labs  Lab 11/21/20 0055 11/22/20 0400 11/23/20 0118  ALBUMIN 2.5* 2.6* 2.6*   No results for input(s): LIPASE, AMYLASE in the last 168 hours. CBC: Recent Labs  Lab 11/19/20 0142 11/20/20 0030 11/21/20 0055 11/22/20 0400 11/23/20 0118  WBC 10.1 10.9* 14.2* 13.8* 19.4*  HGB 10.2* 11.4* 10.6* 11.1* 11.0*  HCT 32.9* 36.3 36.0 39.1 37.7  MCV 98.2 96.0 103.4* 108.0* 105.3*  PLT 192 242 269 285 227   CBG: Recent Labs  Lab 11/22/20 1904 11/22/20 2309 11/23/20 0302 11/23/20 0711 11/23/20 1126  GLUCAP 182* 110* 129* 149* 156*   Studies/Results: No results found. Medications: . sodium chloride Stopped (11/19/20 0401)  . sodium chloride Stopped (11/21/20 0327)  . feeding supplement (VITAL AF 1.2 CAL) 1,000 mL (11/23/20 1227)   . aspirin  81 mg Per Tube Daily  . atorvastatin  40 mg Per Tube q1800  . calcium acetate (Phos Binder)  667 mg Per Tube TID  . docusate  100 mg Per Tube BID  .  heparin  5,000 Units Subcutaneous Q8H  . insulin aspart  0-9 Units Subcutaneous Q4H  . mouth rinse  15 mL Mouth Rinse q12n4p  . midodrine  5 mg Per Tube TID with meals  . multivitamin  1 tablet Per Tube QHS  . pantoprazole sodium  40 mg Per Tube Daily  . polyethylene glycol  17 g Per Tube Daily  . scopolamine  1 patch Transdermal Q72H    OP HD: TTS [place] (last HD 4/2, prior to that last was 3/24)  4h  350/500  82kg  2/2 bath  P4  AVF  Hep none - Hectoral 65mcg IV q HD  Assessment/Plan 1. Resp failure acute w/ hypoxia and hypercapnia - per CCM due to aspiration pneumonitis, possible OSA / OHS as well. ECHO showed pulm HTN, HFpEF, mod-severe MS. Mgmt per CCM. Extubated again 4/15. UF as tolerated with HD  2. ESRD: maintain TTS scheduled here. She cannot get any midodrine if on Bipap or npo. Next HD 4/19 if proceeding with full scope of treatment, appreciate palliative care's assistance.  3. h/o HTN, now hypotensive - intermittently requiring levo for for bp support , monitor closely 4. Volume - is down to dry wt 1st time this admit. No edema of legs or UE's. Will keep even w/ next  HD.  5. Anemia of ESRD: no ESA need at this time, hgb at/around goal, monitor for now 6. Secondary hyperparathyroidism/Metabolic bone disease: stopped binders, see #11 7. T2DM: per primary 8. PAD/leg wounds: ischemic/stasis dermatitis per WOC + primary 9. CAD/HFrEF: UF as tolerated 10. Hyponatremia: stable/improved, managing with HD, 137Na bath, UF as tolerated 11. Hypophosphatemia: replete prn, not on any binders at the moment   [name], MD 11/23/2020, 12:35 PM

## 2020-11-23 NOTE — Progress Notes (Signed)
SLP Cancellation Note  Patient Details Name: Kathleen [name] MRN: [id number] DOB: [birth year]   Cancelled treatment:       Reason Eval/Treat Not Completed: Patient not medically ready. Pt on BiPAP. Will follow for readiness.    Ashliegh Parekh, Katherene Ponto 11/23/2020, 9:18 AM

## 2020-11-23 NOTE — Progress Notes (Signed)
NAME:  Kathleen [name], MRN:  [id number], DOB:  1946/04/22, LOS: 34 ADMISSION DATE:  11/21/2020, CONSULTATION DATE:  4/9 REFERRING MD:  Dr. McDiarmid, CHIEF COMPLAINT:  Pulmonary hypertension  History of Present Illness:  75 year old female with PMH as below, which is significant for ESRD, HFpEF, CVA, DM, CAD and tobacco abuse. She presented to [facility] ED 4/5 with complaints of progressive weakness x 2 week and has missed HD for the entire week prior to presentation. No other specific symptoms at the time of admission. In the emergency department she was found to have hypercarbic respiratory failure and was started on BiPAP. It was felt that with HD she would improve. She largely did improve with HD, despite difficulty using NIMV nocturnally.  Echocardiogram was done as part of the workup and showed markedly elevated pulmonary artery systolic pressure of 37-85%. PCCM was consulted for new diagnosis of pulmonary hypertension.   Later that evening she had vomiting episodes and developed somnolence. She was found to be hypercapneic and was moved to the ICU for intubation and mechanical ventilation.  Significant Hospital Events: Including procedures, antibiotic start and stop dates in addition to other pertinent events   . 4/4 admit  . 4/8 Echo LVEF 60-65%. RV systolic function normal. Mod/sev mitral stenosis, mod TR.  . 4/11 mains on vent  . 4/13 extubated . 4/14 reintubated  . 4/15 one-way extubation  Interim History / Subjective:  Overnight remains on BiPAP.   Objective   Blood pressure (!) 106/57, pulse 90, temperature 98.7 F (37.1 C), temperature source Oral, resp. rate (!) 24, height 5\' 3"  (1.6 m), weight 82.7 kg, SpO2 99 %.    FiO2 (%):  [80 %-90 %] 80 %   Intake/Output Summary (Last 24 hours) at 11/23/2020 0817 Last data filed at 11/23/2020 0700 Gross per 24 hour  Intake 1067.39 ml  Output --  Net 1067.39 ml   Filed Weights   11/20/20 0500 11/21/20 0515 11/23/20 0500   Weight: 83.5 kg 85.3 kg 82.7 kg    Examination:  General: Elderly female, Lying in bed, no distress  HENT: BiPAP in place  Lungs: Rhonchi upper, diminished lower, no use of accessory muscles  Cardiovascular: RRR, no MRG Abdomen: Obese, soft, nontender, nondistended Extremities: Bilateral lower extremities wrapped Neuro: alert, follows simple commands, moves extremities spontaneously   Labs/imaging that I have personally reviewed   ECHO 4/8: LVEF 60-65%. RV systolic function normal. Mod/sev mitral stenosis, mod TR.  Resolved Hospital Problem list     Assessment & Plan:   Acute respiratory failure with hypoxia and hypercapnia Patient required MV due to aspiration pneumonitis. Extubated 4/13, required reintubation same day due to secretions and concern for aspiration. CXR 4/15 improved from prior, decreased diffuse opacity after HD.  Plan -BiPAP as needed -attempt heated high flow this AM > per nurse patient did not tolerate NRB 4/17 -Titrate Supplemental Oxygen to maintain Saturation >92   Pulmonary hypertension on TEE 4/8. RVSP ~ 79.6. Hx concerning for group 2 disease in the setting of known hypertension, HFpEF, and valvular heart disease (mod to severe mitral stenosis). Also concern for group 3 disease considering likely undiagnosed OSA/OHS. Doubt cteph but can get work up in outpatient. Acute on chronic diastolic heart failure  Probable OSA, at risk for OHS Moderate to severe MS H/O HTN Plan -Management with HD > Planned session today -Cardiac Monitoring  -Continue to hold antihypertensive medications in setting of hypotension, Currently off Levophed with midodrine ordered on T/R/S >>  has not received   Will need outpatient PSG, likely referral to Dr. Silas Flood for pH evaluation and management.  TIIDM Plan -Plan to start TF once Cortrack Placed  -Trend glucose  -D/C Lantus due to hypoglycemia. Will d/c dextrose gtt once TF started   GERD Plan -Continue PPI  ESRD,  hypervolemia from dialysis noncompliance Hypocalcemia Hypophosphatemia Plan  -Nephrology is following -Plan for HD today  History of CVA/ CAD Plan -Continue aspirin and statin  Acute metabolic encephalopathy Plan -Avoid Sedative Medications   Leucocytosis, afebrile, likely reactive vs aspiration PNA Plan -Trend WBC and Fever Curve -Hold antibiotics for now  -if Temp >100.5 will culture   Best practice (right click and "Reselect all SmartList Selections" daily)  Diet: NPO DVT prophylaxis: Subcutaneous Heparin GI prophylaxis: PPI Glucose control:  SSI Yes Mobility:  bed rest  PT consulted: N/A Last date of multidisciplinary goals of care discussion: Goal Code Status: DNR/DNI Disposition: ICU  Labs   CBC: Recent Labs  Lab 11/19/20 0142 11/20/20 0030 11/21/20 0055 11/22/20 0400 11/23/20 0118  WBC 10.1 10.9* 14.2* 13.8* 19.4*  HGB 10.2* 11.4* 10.6* 11.1* 11.0*  HCT 32.9* 36.3 36.0 39.1 37.7  MCV 98.2 96.0 103.4* 108.0* 105.3*  PLT 192 242 269 285 254    Basic Metabolic Panel: Recent Labs  Lab 11/17/20 0506 11/17/20 2106 11/18/20 0445 11/19/20 0142 11/20/20 0030 11/21/20 0055 11/22/20 0400 11/23/20 0118  NA 132*  --  135 136 134* 137 135 132*  K 3.9  --  4.2 4.3 3.9 5.9* 3.4* 4.6  CL 95*  --  98 98 97* 98 94* 92*  CO2 25  --  27 27 29 27 27 28   GLUCOSE 273*  --  257* 161* 129* 162* 127* 125*  BUN 53*  --  34* 48* 29* 46* 16 27*  CREATININE 6.05*  --  3.85* 4.88* 3.55* 4.92* 2.55* 4.24*  CALCIUM 9.1  --  7.9* 8.5* 8.6* 8.8* 8.4* 8.8*  MG 2.0  --  1.7 1.9  --   --   --   --   PHOS <1.0*   < > 3.4 3.1 1.8* 8.1* 3.8 5.8*   < > = values in this interval not displayed.   GFR: Estimated Creatinine Clearance: 11.5 mL/min (A) (by C-G formula based on SCr of 4.24 mg/dL (H)). Recent Labs  Lab 11/18/20 0826 11/19/20 0142 11/20/20 0030 11/21/20 0055 11/22/20 0400 11/23/20 0118  PROCALCITON 2.95  --   --   --   --   --   WBC  --    < > 10.9* 14.2* 13.8*  19.4*   < > = values in this interval not displayed.   Total critical care time: 32 minutes  Critical care time was exclusive of separately billable procedures and treating other patients.   Critical care was necessary to treat or prevent imminent or life-threatening deterioration.   Critical care was time spent personally by me on the following activities: development of treatment plan with patient and/or surrogate as well as nursing, discussions with consultants, evaluation of patient's response to treatment, examination of patient, obtaining history from patient or surrogate, ordering and performing treatments and interventions, ordering and review of laboratory studies, ordering and review of radiographic studies, pulse oximetry and re-evaluation of patient's condition.   Hayden Pedro, AGACNP-BC [facility]  See Amion for pager If no response to pager, please call [phone] until 7pm After 7pm, Please call E-link [phone]

## 2020-11-23 NOTE — Progress Notes (Incomplete)
Palliative:  ***  No charge  Vinie Sill, NP Palliative Medicine Team Pager [phone] (Please see [email or web address] for schedule) Team Phone [phone]    Greater than 50%  of this time was spent counseling and coordinating care related to the above assessment and plan

## 2020-11-23 NOTE — Progress Notes (Signed)
Nutrition Follow-up / Consult  DOCUMENTATION CODES:   Obesity unspecified  INTERVENTION:   Initiate tube feeding via Cortrak: Nepro at 45 ml/h (1080 ml per day) Prosource TF 45 ml TID  Provides 2064 kcal, 120 gm protein, 785 ml free water daily  NUTRITION DIAGNOSIS:   Increased nutrient needs related to chronic illness as evidenced by estimated needs.  Ongoing   GOAL:   Patient will meet greater than or equal to 90% of their needs  Progressing with TF initiation today  MONITOR:   TF tolerance,Skin,Vent status,Labs,I & O's  REASON FOR ASSESSMENT:   Consult Enteral/tube feeding initiation and management  ASSESSMENT:   Kathleen [name] is a pleasant 75 year old female with a past medical history of ESRD on hemodialysis, type 2 diabetes mellitus, CAD, CHF (last echo 11/25/2017 with EF 60 to 65% and grade 1 diastolic dysfunction), hypertension, peripheral arterial disease, CVA who presents with gradual onset of 2 weeks of weakness, she notes she has not gone to dialysis in the past week.  Discussed patient in ICU rounds and with RN today. Extubated 4/15 with no plans to reintubate. Has been requiring BiPAP. Tolerating HFNC today. S/P Cortrak placement today, tip is gastric. Received MD Consult for TF initiation and management.  Palliative Care team following.  Plans to continue current interventions, but no reintubation, trach, or PEG.  Receiving HD, next scheduled 4/19.  Per Nephrology, patient is down to EDW today, 82.7 kg.  Labs reviewed. Na 132, phos 5.8 CBG: [phone]  Medications reviewed and include Phoslyra, Colace, Novolog SSI, Rena-vit, Miralax.   Diet Order:   Diet Order            Diet NPO time specified  Diet effective now                 EDUCATION NEEDS:   Not appropriate for education at this time  Skin:  Skin Assessment: Skin Integrity Issues: Skin Integrity Issues:: Other (Comment) Other: chronic BLE wounds related to veous  insufficiency and PAD  Last BM:  4/18 type 7  Height:   Ht Readings from Last 1 Encounters:  11/19/20 5\' 3"  (1.6 m)    Weight:   Wt Readings from Last 1 Encounters:  11/23/20 82.7 kg    Ideal Body Weight:  52.3 kg  BMI:  Body mass index is 32.3 kg/m.  Estimated Nutritional Needs:   Kcal:  2000-2200  Protein:  105-131 g (2-2.5g IBW)  Fluid:  1036mL + UOP    Lucas Mallow, RD, LDN, CNSC Please refer to Amion for contact information.

## 2020-11-23 NOTE — Procedures (Signed)
[facility]  Person Inserting Tube:  Dimitrious Micciche, RD Tube Type:  Cortrak - 43 inches Tube Location:  Left nare Initial Placement:  Stomach Secured by: Bridle Technique Used to Measure Tube Placement:  Documented cm marking at nare/ corner of mouth Cortrak Secured At:  69 cm   No x-ray is required. RN may begin using tube.   If the tube becomes dislodged please keep the tube and contact the [facility] team at [email or web address] (password [email or web address]) for replacement.  If after hours and replacement cannot be delayed, place a NG tube and confirm placement with an abdominal x-ray.   [name] RD, LDN Clinical Nutrition Pager listed in AMION    

## 2020-11-24 ENCOUNTER — Inpatient Hospital Stay (HOSPITAL_COMMUNITY): Payer: Medicare HMO

## 2020-11-24 DIAGNOSIS — N186 End stage renal disease: Secondary | ICD-10-CM | POA: Diagnosis not present

## 2020-11-24 LAB — RENAL FUNCTION PANEL
Albumin: 2.5 g/dL — ABNORMAL LOW (ref 3.5–5.0)
Anion gap: 13 (ref 5–15)
BUN: 51 mg/dL — ABNORMAL HIGH (ref 8–23)
CO2: 27 mmol/L (ref 22–32)
Calcium: 9.2 mg/dL (ref 8.9–10.3)
Chloride: 90 mmol/L — ABNORMAL LOW (ref 98–111)
Creatinine, Ser: 5.73 mg/dL — ABNORMAL HIGH (ref 0.44–1.00)
GFR, Estimated: 7 mL/min — ABNORMAL LOW (ref >60)
Glucose, Bld: 223 mg/dL — ABNORMAL HIGH (ref 70–99)
Phosphorus: 5.7 mg/dL — ABNORMAL HIGH (ref 2.5–4.6)
Potassium: 4.9 mmol/L (ref 3.5–5.1)
Sodium: 130 mmol/L — ABNORMAL LOW (ref 135–145)

## 2020-11-24 LAB — CBC
HCT: 33.8 % — ABNORMAL LOW (ref 36.0–46.0)
Hemoglobin: 9.9 g/dL — ABNORMAL LOW (ref 12.0–15.0)
MCH: 30 pg (ref 26.0–34.0)
MCHC: 29.3 g/dL — ABNORMAL LOW (ref 30.0–36.0)
MCV: 102.4 fL — ABNORMAL HIGH (ref 80.0–100.0)
Platelets: 316 10*3/uL (ref 150–400)
RBC: 3.3 MIL/uL — ABNORMAL LOW (ref 3.87–5.11)
RDW: 14.2 % (ref 11.5–15.5)
WBC: 14.9 10*3/uL — ABNORMAL HIGH (ref 4.0–10.5)
nRBC: 0.2 % (ref 0.0–0.2)

## 2020-11-24 LAB — GLUCOSE, CAPILLARY
Glucose-Capillary: 200 mg/dL — ABNORMAL HIGH (ref 70–99)
Glucose-Capillary: 221 mg/dL — ABNORMAL HIGH (ref 70–99)
Glucose-Capillary: 213 mg/dL — ABNORMAL HIGH (ref 70–99)
Glucose-Capillary: 147 mg/dL — ABNORMAL HIGH (ref 70–99)
Glucose-Capillary: 201 mg/dL — ABNORMAL HIGH (ref 70–99)
Glucose-Capillary: 175 mg/dL — ABNORMAL HIGH (ref 70–99)

## 2020-11-24 LAB — MRSA PCR SCREENING: MRSA by PCR: NEGATIVE

## 2020-11-24 LAB — MAGNESIUM: Magnesium: 2.3 mg/dL (ref 1.7–2.4)

## 2020-11-24 MED ORDER — SODIUM CHLORIDE 0.9 % IV SOLN
1.0000 g | INTRAVENOUS | Status: DC
  Administered 2020-11-24 – 2020-11-25 (×2): 1 g via INTRAVENOUS
  Filled 2020-11-24 (×3): qty 1

## 2020-11-24 MED ORDER — INSULIN GLARGINE 100 UNIT/ML ~~LOC~~ SOLN
10.0000 [IU] | Freq: Every day | SUBCUTANEOUS | Status: DC
  Administered 2020-11-24: 10 [IU] via SUBCUTANEOUS
  Filled 2020-11-24 (×2): qty 0.1

## 2020-11-24 MED ORDER — MIDODRINE HCL 5 MG PO TABS
10.0000 mg | ORAL_TABLET | Freq: Once | ORAL | Status: AC | PRN
  Administered 2020-11-24: 10 mg
  Filled 2020-11-24: qty 2

## 2020-11-24 NOTE — Progress Notes (Signed)
[facility] Progress Note   Subjective:  Seen in ICU, pt is on dialysis now.  BP's 90's - 100's.    Objective Vitals:   11/24/20 1530 11/24/20 1545 11/24/20 1555 11/24/20 1615  BP: 101/63 (!) 104/52 (!) 115/55 112/66  Pulse:  79  85  Resp:  (!) 35  (!) 27  Temp:      TempSrc:      SpO2:      Weight:      Height:       Physical Exam General: elderly, frail, awake, NG tube in place Heart: RRR Lungs: On BiPAP, diminished air entry bibasilar Abdomen: soft, non-tender Extremities: lower legs wrapped, minimal LE edema Neuro: lethargic, arousable Dialysis Access: LUE AVF + bruit  Additional Objective Labs: Basic Metabolic Panel: Recent Labs  Lab 11/22/20 0400 11/23/20 0118 11/24/20 0728  NA 135 132* 130*  K 3.4* 4.6 4.9  CL 94* 92* 90*  CO2 27 28 27   GLUCOSE 127* 125* 223*  BUN 16 27* 51*  CREATININE 2.55* 4.24* 5.73*  CALCIUM 8.4* 8.8* 9.2  PHOS 3.8 5.8* 5.7*   Liver Function Tests: Recent Labs  Lab 11/22/20 0400 11/23/20 0118 11/24/20 0728  ALBUMIN 2.6* 2.6* 2.5*   No results for input(s): LIPASE, AMYLASE in the last 168 hours. CBC: Recent Labs  Lab 11/20/20 0030 11/21/20 0055 11/22/20 0400 11/23/20 0118 11/24/20 0728  WBC 10.9* 14.2* 13.8* 19.4* 14.9*  HGB 11.4* 10.6* 11.1* 11.0* 9.9*  HCT 36.3 36.0 39.1 37.7 33.8*  MCV 96.0 103.4* 108.0* 105.3* 102.4*  PLT 242 269 285 227 316   CBG: Recent Labs  Lab 11/23/20 1905 11/23/20 2305 11/24/20 0305 11/24/20 0702 11/24/20 1100  GLUCAP 178* 158* 200* 221* 213*   Studies/Results: DG CHEST PORT 1 VIEW  Result Date: 11/24/2020 CLINICAL DATA:  Hypoxia, weakness EXAM: PORTABLE CHEST 1 VIEW COMPARISON:  Portable exam at 0928 hrs compared to 11/19/2020 FINDINGS: Feeding tube traverses esophagus and stomach. Enlargement of cardiac silhouette with vascular congestion. Atherosclerotic calcifications aorta. Endotracheal tube removed. Perihilar infiltrates question pulmonary edema, slightly  increased in LEFT perihilar region. Small bibasilar pleural effusions and basilar atelectasis. No pneumothorax. Bones demineralized. IMPRESSION: Enlargement of cardiac silhouette with pulmonary vascular congestion and probable pulmonary edema. Small bibasilar pleural effusions and basilar atelectasis. Electronically Signed   By: Lavonia Dana M.D.   On: 11/24/2020 11:01   Medications: . sodium chloride Stopped (11/24/20 1426)  . sodium chloride Stopped (11/21/20 0327)  . ceFEPime (MAXIPIME) IV Stopped (11/24/20 1323)  . feeding supplement (NEPRO CARB STEADY) 45 mL/hr at 11/24/20 0318   . aspirin  81 mg Per Tube Daily  . atorvastatin  40 mg Per Tube q1800  . docusate  100 mg Per Tube BID  . feeding supplement (PROSource TF)  45 mL Per Tube TID  . heparin  5,000 Units Subcutaneous Q8H  . insulin aspart  0-9 Units Subcutaneous Q4H  . insulin glargine  10 Units Subcutaneous Daily  . mouth rinse  15 mL Mouth Rinse q12n4p  . midodrine  5 mg Per Tube TID with meals  . multivitamin  1 tablet Per Tube QHS  . pantoprazole sodium  40 mg Per Tube Daily  . polyethylene glycol  17 g Per Tube Daily  . scopolamine  1 patch Transdermal Q72H    OP HD: TTS [place] (last HD 4/2, prior to that last was 3/24)  4h  350/500  82kg  2/2 bath  P4  AVF  Hep none -  Hectoral 11mcg IV q HD  Assessment/Plan 1. Resp failure acute w/ hypoxia and hypercapnia - per CCM due to aspiration pneumonitis, possible OSA / OHS as well. ECHO showed pulm HTN, HFpEF, mod-severe MS. Mgmt per CCM. Extubated again 4/15. UF as tolerated with HD  2. ESRD: maintain TTS scheduled here. She cannot get any midodrine if on Bipap or npo. Next HD 4/19. Not a good candidate for CRRT. Cont supportive care as tolerated.  3. BP/ volume - BP's 90-100's mostly, some lower. Off pressors.  4. Volume - is down to dry wt. No edema of legs or UE's. Keep even w/ HD today 5. Anemia of ESRD: no ESA need at this time, hgb at/around goal, monitor for  now 6. Secondary hyperparathyroidism/Metabolic bone disease: stopped binders, see #11 7. T2DM: per primary 8. PAD/leg wounds: ischemic/stasis dermatitis per WOC + primary 9. CAD/HFrEF: UF as tolerated 10. Hyponatremia: stable/improved, managing with HD, 137Na bath, UF as tolerated 11. Hypophosphatemia: replete prn, not on any binders at the moment   [name], MD 11/24/2020, 4:20 PM

## 2020-11-24 NOTE — Progress Notes (Signed)
Max P 30 Min P 10 Set VT 450

## 2020-11-24 NOTE — Progress Notes (Addendum)
NAME:  Kathleen [name], MRN:  [id number], DOB:  1945-11-02, LOS: 63 ADMISSION DATE:  11/14/2020, CONSULTATION DATE:  4/9 REFERRING MD:  Dr. McDiarmid, CHIEF COMPLAINT:  Pulmonary hypertension  History of Present Illness:  75 year old female with PMH as below, which is significant for ESRD, HFpEF, CVA, DM, CAD and tobacco abuse. She presented to [facility] ED 4/5 with complaints of progressive weakness x 2 week and has missed HD for the entire week prior to presentation. No other specific symptoms at the time of admission. In the emergency department she was found to have hypercarbic respiratory failure and was started on BiPAP. It was felt that with HD she would improve. She largely did improve with HD, despite difficulty using NIMV nocturnally.  Echocardiogram was done as part of the workup and showed markedly elevated pulmonary artery systolic pressure of 38-75%. PCCM was consulted for new diagnosis of pulmonary hypertension.   Later that evening she had vomiting episodes and developed somnolence. She was found to be hypercapneic and was moved to the ICU for intubation and mechanical ventilation.  Significant Hospital Events: Including procedures, antibiotic start and stop dates in addition to other pertinent events   . 4/4 admit  . 4/8 Echo LVEF 60-65%. RV systolic function normal. Mod/sev mitral stenosis, mod TR.  . 4/11 mains on vent  . 4/13 extubated . 4/14 reintubated  . 4/15 one-way extubation  Interim History / Subjective:  Tolerated Heated High Flow for last 24 hours. Remains off vasopressors.   Objective   Blood pressure (!) 83/71, pulse 80, temperature 99.2 F (37.3 C), temperature source Axillary, resp. rate (!) 29, height 5\' 3"  (1.6 m), weight 83.9 kg, SpO2 95 %.    FiO2 (%):  [50 %-60 %] 50 %   Intake/Output Summary (Last 24 hours) at 11/24/2020 0838 Last data filed at 11/24/2020 0600 Gross per 24 hour  Intake 385 ml  Output --  Net 385 ml   Filed Weights   11/21/20  0515 11/23/20 0500 11/24/20 0321  Weight: 85.3 kg 82.7 kg 83.9 kg    Examination:  General: Elderly female, Lying in bed, no distress  HENT: Dry MM.  Lungs: Crackles throughout, mild tachypnea  Cardiovascular: RRR, no MRG Abdomen: Obese, soft, nontender, nondistended Extremities: Bilateral lower extremities wrapped Neuro: arouses with stimulation, does not follow commands, moves extremities spontaneously   Labs/imaging that I have personally reviewed   ECHO 4/8: LVEF 60-65%. RV systolic function normal. Mod/sev mitral stenosis, mod TR.  Resolved Hospital Problem list     Assessment & Plan:   Acute respiratory failure with hypoxia and hypercapnia Patient required MV due to aspiration pneumonitis. Extubated 4/13, required reintubation same day due to secretions and concern for aspiration. CXR 4/15 improved from prior, decreased diffuse opacity after HD.  Plan -BiPAP as needed and HS  -Continue Heated High Flow  -Titrate Supplemental Oxygen to maintain Saturation >92   Pulmonary hypertension on TEE 4/8. RVSP ~ 79.6. Hx concerning for group 2 disease in the setting of known hypertension, HFpEF, and valvular heart disease (mod to severe mitral stenosis). Also concern for group 3 disease considering likely undiagnosed OSA/OHS. Doubt cteph but can get work up in outpatient. Acute on chronic diastolic heart failure  Probable OSA, at risk for OHS Moderate to severe MS H/O HTN Plan -Management with HD > Planned session today -Cardiac Monitoring  -Continue to hold antihypertensive medications in setting of hypotension, -Continue Scheduled Midodrine. Give additional dose prior to HD  Will  need outpatient PSG, likely referral to Dr. Silas Flood for pH evaluation and management.  TIIDM Plan -Continue TF -Trend glucose  -Restart Lantus  GERD Plan -Continue PPI  ESRD, hypervolemia from dialysis noncompliance Hypocalcemia Hypophosphatemia Plan  -Nephrology is following -Plan for  HD today  History of CVA/ CAD Plan -Continue aspirin and statin  Acute metabolic encephalopathy Plan -Avoid Sedative Medications   Leucocytosis (uptrending), Febrile up to 101.1, high risk for aspiration PNA vs CAP Plan -Trend WBC and Fever Curve -Obtain Cultures. Repeat CXR -Start Cefepime. Send MRSA PCR if positive will start Vancomycin.   Best practice (right click and "Reselect all SmartList Selections" daily)  Diet: NPO DVT prophylaxis: Subcutaneous Heparin GI prophylaxis: PPI Glucose control:  SSI Yes Mobility:  bed rest  PT consulted: N/A Last date of multidisciplinary goals of care discussion: Goal Code Status: DNR/DNI Disposition: ICU. If tolerates HD can possible transfer to progressive care   Labs   CBC: Recent Labs  Lab 11/19/20 0142 11/20/20 0030 11/21/20 0055 11/22/20 0400 11/23/20 0118  WBC 10.1 10.9* 14.2* 13.8* 19.4*  HGB 10.2* 11.4* 10.6* 11.1* 11.0*  HCT 32.9* 36.3 36.0 39.1 37.7  MCV 98.2 96.0 103.4* 108.0* 105.3*  PLT 192 242 269 285 619    Basic Metabolic Panel: Recent Labs  Lab 11/18/20 0445 11/19/20 0142 11/20/20 0030 11/21/20 0055 11/22/20 0400 11/23/20 0118  NA 135 136 134* 137 135 132*  K 4.2 4.3 3.9 5.9* 3.4* 4.6  CL 98 98 97* 98 94* 92*  CO2 27 27 29 27 27 28   GLUCOSE 257* 161* 129* 162* 127* 125*  BUN 34* 48* 29* 46* 16 27*  CREATININE 3.85* 4.88* 3.55* 4.92* 2.55* 4.24*  CALCIUM 7.9* 8.5* 8.6* 8.8* 8.4* 8.8*  MG 1.7 1.9  --   --   --   --   PHOS 3.4 3.1 1.8* 8.1* 3.8 5.8*   GFR: Estimated Creatinine Clearance: 11.6 mL/min (A) (by C-G formula based on SCr of 4.24 mg/dL (H)). Recent Labs  Lab 11/18/20 0826 11/19/20 0142 11/20/20 0030 11/21/20 0055 11/22/20 0400 11/23/20 0118  PROCALCITON 2.95  --   --   --   --   --   WBC  --    < > 10.9* 14.2* 13.8* 19.4*   < > = values in this interval not displayed.   Total critical care time: 32 minutes  Critical care time was exclusive of separately billable procedures  and treating other patients.   Critical care was necessary to treat or prevent imminent or life-threatening deterioration.   Critical care was time spent personally by me on the following activities: development of treatment plan with patient and/or surrogate as well as nursing, discussions with consultants, evaluation of patient's response to treatment, examination of patient, obtaining history from patient or surrogate, ordering and performing treatments and interventions, ordering and review of laboratory studies, ordering and review of radiographic studies, pulse oximetry and re-evaluation of patient's condition.   Hayden Pedro, AGACNP-BC [facility]  See Amion for pager If no response to pager, please call [phone] until 7pm After 7pm, Please call E-link [phone]

## 2020-11-24 NOTE — Evaluation (Signed)
Clinical/Bedside Swallow Evaluation Patient Details  Name: Kathleen [name] MRN: [id number] Date of Birth: [birth year]  Today's Date: 11/24/2020 Time: SLP Start Time (ACUTE ONLY): 0900 SLP Stop Time (ACUTE ONLY): 0910 SLP Time Calculation (min) (ACUTE ONLY): 10 min  Past Medical History:  Past Medical History:  Diagnosis Date  . Acute CVA (cerebrovascular accident) ([place]) 11/08/2017   Brain MRI 11/08/2017:Two subcentimeter lacunar infarctions in the left corona radiata. These are probably early subacute in age    . Acute respiratory failure with hypercapnia ([place])   . Acute respiratory failure with hypoxia ([place]) 01/29/2019  . Altered mental status 07/14/2017  . Anemia    a. 01/2014: suspected of chronic disease, negative FOBT.  [name]    knees  . Asthma    many years ago  . Benign neoplasm of ascending colon   . Benign neoplasm of cecum   . Benign neoplasm of sigmoid colon   . BPPV (benign paroxysmal positional vertigo) 01/22/2014  . CAD (coronary artery disease)    a. Cath 01/2014: mild in LAD, mild-mod LCx, mod-severe RCA - for med rx.  . CVA (cerebral vascular accident) ([place]) 11/09/2017   Brain MRI: Two subcentimeter lacunar infarctions in the left corona radiata. These are probably early subacute  . Diabetes mellitus without complication ([place])   . Disorientation   . ESRD (end stage renal disease) ([place])    [place]  . Gastritis and gastroduodenitis   . Hematochezia   . History of echocardiogram    Echo (10/15):  Mod LVH, EF 50-55%, Gr 1 DD, MAC, mild MR, mild TR, PASP 35 mmHg  . Hx of transfusion   . Hyperlipidemia   . Hypernatremia   . Hypertension   . Hypertensive urgency 01/21/2014  . Meningioma ([place])    a. Incidental dx 01/2014.  [name] Noncompliance with medication regimen   . Obesity   . Pleural effusion, left   . Pulmonary edema 01/29/2019  . Pulmonary hypertension due to sleep-disordered breathing ([place]) 11/14/2020  . Pulmonary vascular congestion   . Rectal  bleeding 09/01/2018  . Renal insufficiency   . Syncope and collapse 06/10/2014  . Systolic and diastolic CHF, acute ([place])   . Systolic CHF ([place])    a. 01/5680: dx with mixed ICM/NICM (out of proportion to CAD) EF 25-30% by echo.   . Tobacco abuse 01/21/2014   Tobacco abuse    Past Surgical History:  Past Surgical History:  Procedure Laterality Date  . ABDOMINAL HYSTERECTOMY    . AV FISTULA PLACEMENT Left 02/10/2017   Procedure: ARTERIOVENOUS (AV) FISTULA CREATION-LEFT FOREARM;  Surgeon: Elam Dutch, MD;  Location: [place];  Service: Vascular;  Laterality: Left;  . BIOPSY  09/04/2018   Procedure: BIOPSY;  Surgeon: Jerene Bears, MD;  Location: [facility] ENDOSCOPY;  Service: Gastroenterology;;  . BREAST SURGERY Right    Lumpectomy  . COLONOSCOPY    . COLONOSCOPY WITH PROPOFOL N/A 09/04/2018   Procedure: COLONOSCOPY WITH PROPOFOL;  Surgeon: Jerene Bears, MD;  Location: [place];  Service: Gastroenterology;  Laterality: N/A;  . ESOPHAGOGASTRODUODENOSCOPY (EGD) WITH PROPOFOL N/A 09/04/2018   Procedure: ESOPHAGOGASTRODUODENOSCOPY (EGD) WITH PROPOFOL;  Surgeon: Jerene Bears, MD;  Location: [facility] ENDOSCOPY;  Service: Gastroenterology;  Laterality: N/A;  . FISTULA SUPERFICIALIZATION Left 08/09/2017   Procedure: FISTULA SUPERFICIALIZATION LEFT ARM ARTERIOVENOUS FISTULA;  Surgeon: Conrad Rozel, MD;  Location: [place];  Service: Vascular;  Laterality: Left;  . INSERTION OF DIALYSIS CATHETER Right 02/10/2017   Procedure: INSERTION OF  DIALYSIS CATHETER;  Surgeon: Elam Dutch, MD;  Location: [place];  Service: Vascular;  Laterality: Right;  . LEFT HEART CATHETERIZATION WITH CORONARY ANGIOGRAM N/A 01/27/2014   Procedure: LEFT HEART CATHETERIZATION WITH CORONARY ANGIOGRAM;  Surgeon: Jettie Booze, MD;  Location: [facility] CATH LAB;  Service: Cardiovascular;  Laterality: N/A;  . POLYPECTOMY  09/04/2018   Procedure: POLYPECTOMY;  Surgeon: Jerene Bears, MD;  Location: [facility] ENDOSCOPY;  Service: Gastroenterology;;  .  TONSILLECTOMY     HPI:  75 year old female with PMH as below, which is significant for ESRD, HFpEF, CVA, DM, CAD and tobacco abuse. She presented to [facility] ED 4/5 with complaints of progressive weakness x 2 week and has missed HD for the entire week prior to presentation. No other specific symptoms at the time of admission. In the emergency department she was found to have hypercarbic respiratory failure and was started on BiPAP. Intubated 4/8-4/13, reintubated 4/14-4/15 to BiPAP   Assessment / Plan / Recommendation Clinical Impression  Brief inital trials given, Pt alert, but not interested in PO, startles with touch of ice and puree to lips though eventually accepts the bolus. Prolonged oral phase followed by grimacing indicating pain with swallow followed by weak throat clearing. Pt with very weak cough strength. Recommend pt be given occasional bites of ice if desired, but no sips or further trials yet. SLP Visit Diagnosis: Dysphagia, oropharyngeal phase (R13.12)    Aspiration Risk  Severe aspiration risk    Diet Recommendation Ice chips PRN after oral care        Other  Recommendations     Follow up Recommendations Skilled Nursing facility      Frequency and Duration min 2x/week  2 weeks       Prognosis Prognosis for Safe Diet Advancement: Good      Swallow Study   General HPI: 75 year old female with PMH as below, which is significant for ESRD, HFpEF, CVA, DM, CAD and tobacco abuse. She presented to [facility] 4/5 with complaints of progressive weakness x 2 week and has missed HD for the entire week prior to presentation. No other specific symptoms at the time of admission. In the emergency department she was found to have hypercarbic respiratory failure and was started on BiPAP. Intubated 4/8-4/13, reintubated 4/14-4/15 to BiPAP Type of Study: Bedside Swallow Evaluation Diet Prior to this Study: NPO;NG Tube Temperature Spikes Noted: No Respiratory Status: Nasal  cannula History of Recent Intubation: Yes Length of Intubations (days): 8 days Date extubated: 11/20/20 Behavior/Cognition: Alert;Requires cueing;Confused Oral Care Completed by SLP: No Oral Cavity - Dentition: Edentulous Vision: Impaired for self-feeding Self-Feeding Abilities: Total assist Patient Positioning: Upright in bed Baseline Vocal Quality: Breathy;Aphonic Volitional Cough: Weak    Oral/Motor/Sensory Function Overall Oral Motor/Sensory Function: Within functional limits   Ice Chips Ice chips: Impaired Presentation: Spoon Oral Phase Impairments: Poor awareness of bolus   Thin Liquid Thin Liquid: Not tested    Nectar Thick Nectar Thick Liquid: Not tested   Honey Thick Honey Thick Liquid: Not tested   Puree Puree: Impaired Presentation: Spoon Oral Phase Functional Implications: Oral holding;Prolonged oral transit Pharyngeal Phase Impairments: Throat Clearing - Immediate   Solid     Solid: Not tested      Lucylle Foulkes, Katherene Ponto 11/24/2020,11:00 AM

## 2020-11-24 NOTE — Procedures (Signed)
   I was present at this dialysis session, have reviewed the session itself and made  appropriate changes [name] MD [place] pager [phone]   11/24/2020, 4:37 PM

## 2020-11-25 DIAGNOSIS — R531 Weakness: Secondary | ICD-10-CM | POA: Diagnosis not present

## 2020-11-25 DIAGNOSIS — J9602 Acute respiratory failure with hypercapnia: Secondary | ICD-10-CM | POA: Diagnosis not present

## 2020-11-25 DIAGNOSIS — J9612 Chronic respiratory failure with hypercapnia: Secondary | ICD-10-CM | POA: Diagnosis not present

## 2020-11-25 DIAGNOSIS — N186 End stage renal disease: Secondary | ICD-10-CM | POA: Diagnosis not present

## 2020-11-25 LAB — GLUCOSE, CAPILLARY
Glucose-Capillary: 204 mg/dL — ABNORMAL HIGH (ref 70–99)
Glucose-Capillary: 227 mg/dL — ABNORMAL HIGH (ref 70–99)
Glucose-Capillary: 263 mg/dL — ABNORMAL HIGH (ref 70–99)
Glucose-Capillary: 268 mg/dL — ABNORMAL HIGH (ref 70–99)
Glucose-Capillary: 210 mg/dL — ABNORMAL HIGH (ref 70–99)

## 2020-11-25 MED ORDER — HYDROMORPHONE HCL 1 MG/ML IJ SOLN
0.2500 mg | INTRAMUSCULAR | Status: DC | PRN
Start: 2020-11-25 — End: 2020-11-26

## 2020-11-25 MED ORDER — INSULIN GLARGINE 100 UNIT/ML ~~LOC~~ SOLN
15.0000 [IU] | Freq: Every day | SUBCUTANEOUS | Status: DC
  Administered 2020-11-25: 15 [IU] via SUBCUTANEOUS
  Filled 2020-11-25 (×2): qty 0.15

## 2020-11-25 MED ORDER — MIDODRINE HCL 5 MG PO TABS
10.0000 mg | ORAL_TABLET | Freq: Three times a day (TID) | ORAL | Status: DC
  Administered 2020-11-25 (×2): 10 mg
  Filled 2020-11-25 (×2): qty 2

## 2020-11-28 DIAGNOSIS — Z66 Do not resuscitate: Secondary | ICD-10-CM | POA: Diagnosis not present

## 2020-11-29 LAB — CULTURE, BLOOD (ROUTINE X 2)
Culture: NO GROWTH
Culture: NO GROWTH

## 2020-12-06 NOTE — Progress Notes (Signed)
NAME:  Kathleen [name], MRN:  [id number], DOB:  Sep 29, 1945, LOS: 65 ADMISSION DATE:  11/08/2020, CONSULTATION DATE:  4/9 REFERRING MD:  Dr. McDiarmid, CHIEF COMPLAINT:  Pulmonary hypertension  History of Present Illness:  75 year old female with PMH as below, which is significant for ESRD, HFpEF, CVA, DM, CAD and tobacco abuse. She presented to [facility] ED 4/5 with complaints of progressive weakness x 2 week and has missed HD for the entire week prior to presentation. No other specific symptoms at the time of admission. In the emergency department she was found to have hypercarbic respiratory failure and was started on BiPAP. It was felt that with HD she would improve. She largely did improve with HD, despite difficulty using NIMV nocturnally.  Echocardiogram was done as part of the workup and showed markedly elevated pulmonary artery systolic pressure of 06-23%. PCCM was consulted for new diagnosis of pulmonary hypertension.   Later that evening she had vomiting episodes and developed somnolence. She was found to be hypercapneic and was moved to the ICU for intubation and mechanical ventilation.  Significant Hospital Events: Including procedures, antibiotic start and stop dates in addition to other pertinent events   . 4/4 admit  . 4/8 Echo LVEF 60-65%. RV systolic function normal. Mod/sev mitral stenosis, mod TR.  . 4/11 mains on vent  . 4/13 extubated . 4/14 reintubated  . 4/15 one-way extubation . 4/18: off Vasopressors  Interim History / Subjective:  + 1 L I/O, net + 1.9 L this admission. Febrile at 102.3. Interactive on Bipap this am.   Objective   Blood pressure (!) 84/46, pulse 71, temperature (!) 101 F (38.3 C), temperature source Axillary, resp. rate (!) 29, height 5\' 3"  (1.6 m), weight 84.5 kg, SpO2 100 %.    FiO2 (%):  [60 %-80 %] 80 %   Intake/Output Summary (Last 24 hours) at Nov 28, 2020 0807 Last data filed at 11-28-2020 0600 Gross per 24 hour  Intake 1055.69 ml  Output  0 ml  Net 1055.69 ml   Filed Weights   11/24/20 1340 11/24/20 1745 11-28-2020 0410  Weight: 81.9 kg 81.5 kg 84.5 kg    Examination:  General: Elderly female resting in bed on Bipap HENT: Dry MM.  Lungs: Scattered rhonchi throughout lung fields, on bipap without distress.   Cardiovascular: RRR, no MRG, no edema Abdomen: Obese, soft, nontender, nondistended Extremities: Bilateral lower extremities wrapped Neuro: Opens eyes to tactile stimulation, wiggles both toes to command, shakes head no appropriately, does not attempt verbal.     Labs/imaging that I have personally reviewed   ECHO 4/8: LVEF 60-65%. RV systolic function normal. Mod/sev mitral stenosis, mod TR.  Resolved Hospital Problem list     Assessment & Plan:   Acute respiratory failure with hypoxia and hypercapnia Patient required MV due to aspiration pneumonitis. Extubated 4/13, required reintubation same day due to secretions and concern for aspiration. CXR 4/15 improved from prior, decreased diffuse opacity after HD. Extubated 4/15 with plans to not re-intubate.  Plan -Bipap overnight, will transition to high flow [place] this am.   Pulmonary hypertension on TEE 4/8. RVSP ~ 79.6. Hx concerning for group 2 disease in the setting of known hypertension, HFpEF, and valvular heart disease (mod to severe mitral stenosis). Also concern for group 3 disease considering likely undiagnosed OSA/OHS. Doubt cteph but can get work up in outpatient. Acute on chronic diastolic heart failure  Probable OSA, at risk for OHS Moderate to severe MS H/O HTN Plan -Management  with HD and fluid removal.  -Continue Scheduled Midodrine, 5 mg TID. Marginal blood pressure overnight, increase midodrine to 10 mg TID.  -*Will need outpatient PSG, likely referral to Dr. Silas Flood for pH evaluation and management.  TIIDM Plan -SSI q 4 hours with 10 units Lantus daily. Increase Lantus to 15 units daily  GERD Plan -Continue PPI  ESRD, hypervolemia from  dialysis noncompliance Hypocalcemia Hypophosphatemia Plan  -Nephrology following, maintaining TTS schedule.  History of CVA/ CAD Plan -Continue aspirin and statin  Acute metabolic encephalopathy Plan -Avoid Sedative Medications   Fever, high risk for aspiration PNA vs CAP Plan -4/19 Blood Cx NGTD -MRSA nares negative. Remains hypoxic.  Continue Cefepime D2 for suspected aspiration/pneumonia.   Best practice (right click and "Reselect all SmartList Selections" daily)  Diet: NPO, SLP evaluated 4/19. Continue Tube Feeds DVT prophylaxis: Subcutaneous Heparin GI prophylaxis: PPI Glucose control:  SSI Yes Mobility:  bed rest  PT consulted: N/A Last date of multidisciplinary goals of care discussion: Will have palliative re-engage.  Code Status: DNR/DNI Disposition: ICU  Labs   CBC: Recent Labs  Lab 11/20/20 0030 11/21/20 0055 11/22/20 0400 11/23/20 0118 11/24/20 0728  WBC 10.9* 14.2* 13.8* 19.4* 14.9*  HGB 11.4* 10.6* 11.1* 11.0* 9.9*  HCT 36.3 36.0 39.1 37.7 33.8*  MCV 96.0 103.4* 108.0* 105.3* 102.4*  PLT 242 269 285 227 403    Basic Metabolic Panel: Recent Labs  Lab 11/19/20 0142 11/20/20 0030 11/21/20 0055 11/22/20 0400 11/23/20 0118 11/24/20 0728  NA 136 134* 137 135 132* 130*  K 4.3 3.9 5.9* 3.4* 4.6 4.9  CL 98 97* 98 94* 92* 90*  CO2 27 29 27 27 28 27   GLUCOSE 161* 129* 162* 127* 125* 223*  BUN 48* 29* 46* 16 27* 51*  CREATININE 4.88* 3.55* 4.92* 2.55* 4.24* 5.73*  CALCIUM 8.5* 8.6* 8.8* 8.4* 8.8* 9.2  MG 1.9  --   --   --   --  2.3  PHOS 3.1 1.8* 8.1* 3.8 5.8* 5.7*   GFR: Estimated Creatinine Clearance: 8.6 mL/min (A) (by C-G formula based on SCr of 5.73 mg/dL (H)). Recent Labs  Lab 11/18/20 0826 11/19/20 0142 11/21/20 0055 11/22/20 0400 11/23/20 0118 11/24/20 0728  PROCALCITON 2.95  --   --   --   --   --   WBC  --    < > 14.2* 13.8* 19.4* 14.9*   < > = values in this interval not displayed.   Total critical care time: 38  minutes  Critical care time was exclusive of separately billable procedures and treating other patients.   Critical care was necessary to treat or prevent imminent or life-threatening deterioration.   Critical care was time spent personally by me on the following activities: development of treatment plan with patient and/or surrogate as well as nursing, discussions with consultants, evaluation of patient's response to treatment, examination of patient, obtaining history from patient or surrogate, ordering and performing treatments and interventions, ordering and review of laboratory studies, ordering and review of radiographic studies, pulse oximetry and re-evaluation of patient's condition.   Paulita Fujita, ACNP [facility]  After 7pm, Please call E-link [phone]

## 2020-12-06 NOTE — Progress Notes (Signed)
AVAPS Max p 30 Min P 10 Set VT 450

## 2020-12-06 NOTE — Progress Notes (Signed)
Palliative Medicine Progress Note  Medical records reviewed. Assessed patient at the bedside. She is minimally interactive with me and appears uncomfortable. Will nod her head "no" when asked if she can open her eyes.  Called son Kathleen [name] to provide support and discuss his thoughts on patient's current illness. He tells me that she was more interactive yesterday during his visit, speaking briefly and even laughing which caused a lot of coughing. He is hopeful that she will continue to tolerated wean to [place] and HD later this afternoon. He states that she has appeared comfortable during his visits. I informed him that she was much less interactive with me today and I am concerned for her increased weakness, WBC counts, and fluctuating alertness. Goals of care continue to be clearly expressed - Kathleen [name] desires to continue current interventions and is open to addressing a rapid decline at that time.  Offered to continue calling and providing support to which he agrees.   NO CHARGE   Kathleen Wailes Burt Knack, PA-C Palliative Medicine Team Team phone # [phone]  Thank you for allowing the Palliative Medicine Team to assist in the care of this patient. Please utilize secure chat with additional questions, if there is no response within 30 minutes please call the above phone number.  Palliative Medicine Team providers are available by phone from 7am to 7pm daily and can be reached through the team cell phone.  Should this patient require assistance outside of these hours, please call the patient's attending physician.

## 2020-12-06 NOTE — Progress Notes (Signed)
Palliative Medicine Progress Note  Subjective:  Medical records reviewed. Discussed with RN Crystal and assessed patient at bedside. She appears uncomfortable, in mild respiratory distress and not responsive to my voice or touch. No family present at the bedside.  Per RN, patient has required BiPAP overnight and into the early morning. Despite this, her shortness of breath and CO2 retention have persisted, contributing to her fluctuations in mental status. Patient has verbally stated her distress throughout the day, commenting "help me" and "get me out of here."   Called patient's son [name] to provide an update, psychosocial support, and recommendations for comfort care. He confirms that his visit yesterday unfortunately showed him less promising indicators for improvement. Patient did not respond to him either, which was a big difference from the day prior. He notes that she was also very lethargic at home after dialysis "wiped her out." Educated on the multifactorial causes of patient's altered mental status.  I shared my professional opinion and recommendation that despite continued hope and prayers for improvement, all signs are pointing to a decision for a full comfort path at this time. Discussed that the patient is unlikely to improve with current plan of on/off BiPAP. She is uncomfortable on the BiPAP and also without it. We discussed patient's comments throughout the day as additional indicators for an appropriate transition to comfort care. [name] understands this and voices appreciation of staff's concerns. He has been preparing for this possibility and will be calling his siblings to inform them of the situation as well. He would still like to see the patient in a few hours before making any decisions. Encouraged him to discuss with nursing and medical team if he feels he is ready to transition to comfort care this evening.      Questions and concerns addressed. PMT will continue to support  holistically.   HPI per Paulita Fujita NP progress note: 75 year old female with PMH as below, which is significant for ESRD, HFpEF, CVA, DM, CAD and tobacco abuse. She presented to [facility] ED 4/5 with complaints of progressive weakness x 2 week and has missed HD for the entire week prior to presentation. No other specific symptoms at the time of admission. In the emergency department she was found to have hypercarbic respiratory failure and was started on BiPAP. It was felt that with HD she would improve. She largely did improve with HD, despite difficulty using NIMV nocturnally.  Echocardiogram was done as part of the workup and showed markedly elevated pulmonary artery systolic pressure of 44-01%. PCCM was consulted for new diagnosis of pulmonary hypertension.   Later that evening she had vomiting episodes and developed somnolence. She was found to be hypercapneic and was moved to the ICU for intubation and mechanical ventilation.  Physical Exam:  General: appears ill and uncomfortable Neuro: unresponsive to voice or touch Cardiovascular: regular rate Lungs: tachypneic on 40 L [place]  Assessment Acute hypoxemic/hypercarbic respiratory failure Acute on chronic diastolic heart failure Fever and suspected aspiration pneumonia  Pulmonary hypertension Acute metabolic encephalopathy   Plan -Recommended a transition to comfort care -Psychosocial and emotional support provided -Continue current interventions until patient's son arrives this evening -He will benefit from additional discussions and recommendations during his visit, he would like to see patient first but is willing to consider transitioning to comfort   Total time: 20 minutes  Greater than 50% of this time was spent in counseling and coordinating care related to the above assessment and plan.  Amyjo Mizrachi  [facility] Palliative Medicine Team Team phone # [phone]  Thank you for allowing the Palliative Medicine Team to  assist in the care of this patient. Please utilize secure chat with additional questions, if there is no response within 30 minutes please call the above phone number.  Palliative Medicine Team providers are available by phone from 7am to 7pm daily and can be reached through the team cell phone.  Should this patient require assistance outside of these hours, please call the patient's attending physician.

## 2020-12-06 NOTE — Death Summary Note (Signed)
DEATH SUMMARY   Patient Details  Name: Kathleen [name] MRN: [id number] DOB: Apr 21, 1946  Admission/Discharge Information   Admit Date:  2020/11/13  Date of Death: Date of Death: November 28, 2020  Time of Death: Time of Death: 11/01/51  Length of Stay: October 23, 2022  Referring Physician: Benito Mccreedy, MD   Reason(s) for Hospitalization  Acute on chronic hypercarbic respiratory failure  Diagnoses  Preliminary cause of death: acute on chronic hypercarbic and hypoxemic respiratory failure Secondary Diagnoses (including complications and co-morbidities):  Principal Problem:   ESRD (end stage renal disease) ([place]) Active Problems:   Hypertension associated with diabetes ([place])   Somnolence   Goals of care, counseling/discussion   DM (diabetes mellitus), type 2 with renal complications ([place])   Encounter for assessment of decision-making capacity   Agitation   Chronic respiratory failure with hypercapnia (HCC)   Weakness   Balance problems   Muscle weakness (generalized)   History of CVA (cerebrovascular accident)   Mitral stenosis   Diastolic dysfunction   Acute hypercapnic respiratory failure ([place])   Endotracheal tube present   On mechanically assisted ventilation ([place])   DNR (do not resuscitate)   Brief Hospital Course (including significant findings, care, treatment, and services provided and events leading to death)  Kathleen [name] is a 75 y.o. year old female who presented with malaise, FTT after missing a week of HD. Progressive hypercarbia with lack of adherence to NIV leading to intubation. Was successfully extubated but had persistent hypoxemia, hypercarbia. Was made DNR. Continued dialysis. Was encephalopathic. Ongoing GOC discussion with Palliative Medicine. She had episode of progressive hypoxemia ultimately leading to cardiac arrest and death 2020-11-28.    Pertinent Labs and Studies  Significant Diagnostic Studies DG Abd 1 View  Result Date: 11/15/2020 CLINICAL DATA:  Orogastric  tube placement EXAM: ABDOMEN - 1 VIEW COMPARISON:  12/22/2015 FINDINGS: Enteric tube which at least reaches the stomach, tip not visualized. Hazy appearance of the bilateral lower chest as described on dedicated chest x-ray. Cardiomegaly. No dilated bowel seen in the covered abdomen. IMPRESSION: Enteric tube with tip at least reaching the distal stomach. Electronically Signed   By: Monte Fantasia M.D.   On: 11/15/2020 05:49   DG CHEST PORT 1 VIEW  Result Date: 11/24/2020 CLINICAL DATA:  Hypoxia, weakness EXAM: PORTABLE CHEST 1 VIEW COMPARISON:  Portable exam at 0928 hrs compared to 11/19/2020 FINDINGS: Feeding tube traverses esophagus and stomach. Enlargement of cardiac silhouette with vascular congestion. Atherosclerotic calcifications aorta. Endotracheal tube removed. Perihilar infiltrates question pulmonary edema, slightly increased in LEFT perihilar region. Small bibasilar pleural effusions and basilar atelectasis. No pneumothorax. Bones demineralized. IMPRESSION: Enlargement of cardiac silhouette with pulmonary vascular congestion and probable pulmonary edema. Small bibasilar pleural effusions and basilar atelectasis. Electronically Signed   By: Lavonia Dana M.D.   On: 11/24/2020 11:01   DG CHEST PORT 1 VIEW  Result Date: 11/19/2020 CLINICAL DATA:  Endotracheal and orogastric tube placement EXAM: PORTABLE CHEST 1 VIEW COMPARISON:  11/19/2018 FINDINGS: Endotracheal tube remains in position above the carina. Interval placement of esophagogastric tube, tip and side port below the diaphragm. Improved aeration of the right lung base. Otherwise unchanged examination with mild diffuse interstitial opacity and cardiomegaly. IMPRESSION: 1. Interval placement of esophagogastric tube, tip and side port below the diaphragm. 2. Endotracheal tube remains in position above the carina. 3. Improved aeration of the right lung base. Otherwise unchanged examination with mild diffuse interstitial opacity and cardiomegaly.  Electronically Signed   By: Eddie Candle M.D.   On:  11/19/2020 20:43   DG CHEST PORT 1 VIEW  Result Date: 11/18/2020 CLINICAL DATA:  Weakness, vomiting. EXAM: PORTABLE CHEST 1 VIEW COMPARISON:  November 16, 2020. FINDINGS: Stable cardiomegaly with central pulmonary vascular congestion. Endotracheal tube is in good position. Nasogastric tube has been removed. Mild bilateral pulmonary edema cannot be excluded. Right basilar opacity is noted concerning for effusion with associated atelectasis. Bony thorax is unremarkable. IMPRESSION: Stable cardiomegaly with central pulmonary vascular congestion and possible bilateral pulmonary edema. Right basilar opacity is noted concerning for effusion with associated atelectasis. Electronically Signed   By: Marijo Conception M.D.   On: 11/18/2020 16:59   DG CHEST PORT 1 VIEW  Result Date: 11/16/2020 CLINICAL DATA:  Hypoxia EXAM: PORTABLE CHEST 1 VIEW COMPARISON:  November 15, 2020 FINDINGS: Endotracheal tube tip is 3.0 cm above the carina. Nasogastric tube tip and side port are below the diaphragm. No pneumothorax. There are pleural effusions bilaterally with bibasilar atelectasis. There is cardiomegaly with mild pulmonary venous hypertension. No adenopathy. No bone lesions. IMPRESSION: Tube and catheter positions as described without pneumothorax. Cardiomegaly with suspected degree of pulmonary vascular congestion. Pleural effusions bilaterally. Suspect a degree of underlying congestive heart failure. No appreciable airspace consolidation or well-defined pulmonary edema. Electronically Signed   By: Lowella Grip III M.D.   On: 11/16/2020 10:18   DG CHEST PORT 1 VIEW  Result Date: 11/15/2020 CLINICAL DATA:  Intubated EXAM: PORTABLE CHEST 1 VIEW COMPARISON:  12/02/2020, 04/11/2020 FINDINGS: Interval intubation, tip of the endotracheal tube is about 1.5 cm superior to carina. Esophageal tube tip below the diaphragm but incompletely visualized. Cardiomegaly with vascular  congestion. Probable trace pleural effusions. No focal consolidation or pneumothorax IMPRESSION: 1. Endotracheal tube tip is about 1.5 cm superior to carina. 2. Cardiomegaly with vascular congestion and probable trace pleural effusions. Electronically Signed   By: Donavan Foil M.D.   On: 11/15/2020 00:34   DG Chest Port 1 View  Result Date: 11/17/2020 CLINICAL DATA:  Weakness, nausea EXAM: PORTABLE CHEST 1 VIEW COMPARISON:  04/11/2020 FINDINGS: No frank interstitial edema. Mild right basilar opacity, likely scarring/atelectasis. No focal consolidation. No pleural effusion or pneumothorax. Cardiomegaly. IMPRESSION: Cardiomegaly.  No frank interstitial edema. Electronically Signed   By: Julian Hy M.D.   On: 12/02/2020 12:34   DG Abd Portable 1V  Result Date: 11/19/2020 CLINICAL DATA:  Enteric catheter placement EXAM: PORTABLE ABDOMEN - 1 VIEW COMPARISON:  11/18/2020 FINDINGS: Frontal view of the lower chest and upper abdomen demonstrates tip and side port of an enteric catheter overlying the gastric body. Bowel gas pattern is unremarkable. IMPRESSION: 1. Enteric catheter projecting over gastric body. Electronically Signed   By: Randa Ngo M.D.   On: 11/19/2020 21:15   DG Abd Portable 1V  Result Date: 11/18/2020 CLINICAL DATA:  Enteric catheter placement EXAM: PORTABLE ABDOMEN - 1 VIEW COMPARISON:  11/15/2020 FINDINGS: Portable supine frontal view of the lower chest and upper abdomen was performed, excluding the right flank by collimation. Evaluation limited by portable technique and body habitus. Enteric catheter tip and side port project over the gastric antrum. Unremarkable bowel gas pattern. IMPRESSION: 1. Enteric catheter projecting over gastric antrum. Electronically Signed   By: Randa Ngo M.D.   On: 11/18/2020 18:17   ECHOCARDIOGRAM COMPLETE  Result Date: 11/13/2020    ECHOCARDIOGRAM REPORT   Patient Name:   CACIE GASKINS [facility] Date of Exam: 11/13/2020 Medical Rec #:  [id number]         Height:  63.0 in Accession #:    [id number]       Weight:       186.3 lb Date of Birth:  1945-09-09         BSA:          1.876 m Patient Age:    75 years         BP:           162/72 mmHg Patient Gender: F                HR:           88 bpm. Exam Location:  Inpatient Procedure: 2D Echo, Cardiac Doppler, Color Doppler and 3D Echo Indications:    Congestive Heart Failure I50.9  History:        Patient has prior history of Echocardiogram examinations, most                 recent 11/09/2017. CHF, CAD; Risk Factors:Hypertension, Diabetes                 and Dyslipidemia. End stage renal disease, anemia.  Sonographer:    Darlina Sicilian RDCS Referring Phys: [id number] TODD D MCDIARMID  Sonographer Comments: Global longitudinal strain was attempted. IMPRESSIONS  1. Left ventricular ejection fraction, by estimation, is 60 to 65%. The left ventricle has normal function. The left ventricle has no regional wall motion abnormalities. There is moderate left ventricular hypertrophy. Left ventricular diastolic parameters are indeterminate.  2. Right ventricular systolic function is normal. The right ventricular size is mildly enlarged. There is severely elevated pulmonary artery systolic pressure. The estimated right ventricular systolic pressure is 29.5 mmHg.  3. The mitral valve is degenerative. Trivial mitral valve regurgitation. Severe mitral annular calcification. Moderate to severe mitral stenosis. MG 9mmHg at HR 79bpm, MVA 0.9 cm^2 by continuity equation  4. Tricuspid valve regurgitation is moderate.  5. The aortic valve is tricuspid. Aortic valve regurgitation is trivial. Mild aortic valve sclerosis is present, with no evidence of aortic valve stenosis.  6. The inferior vena cava is dilated in size with <50% respiratory variability, suggesting right atrial pressure of 15 mmHg.  7. Mobile calcified echodensity on mitral valve, suspect mobile mitral annular calcification but would recommend checking blood cultures to evaluate  for bacteremia FINDINGS  Left Ventricle: Left ventricular ejection fraction, by estimation, is 60 to 65%. The left ventricle has normal function. The left ventricle has no regional wall motion abnormalities. The left ventricular internal cavity size was normal in size. There is  moderate left ventricular hypertrophy. Left ventricular diastolic parameters are indeterminate. Right Ventricle: The right ventricular size is mildly enlarged. No increase in right ventricular wall thickness. Right ventricular systolic function is normal. There is severely elevated pulmonary artery systolic pressure. The tricuspid regurgitant velocity is 4.02 m/s, and with an assumed right atrial pressure of 15 mmHg, the estimated right ventricular systolic pressure is 28.4 mmHg. Left Atrium: Left atrial size was normal in size. Right Atrium: Right atrial size was normal in size. Pericardium: There is no evidence of pericardial effusion. Mitral Valve: The mitral valve is degenerative in appearance. Severe mitral annular calcification. Trivial mitral valve regurgitation. Moderate to severe mitral valve stenosis. MV peak gradient, 20.4 mmHg. The mean mitral valve gradient is 7.7 mmHg. Tricuspid Valve: The tricuspid valve is normal in structure. Tricuspid valve regurgitation is moderate. Aortic Valve: The aortic valve is tricuspid. Aortic valve regurgitation is trivial. Mild aortic valve sclerosis is present, with no evidence of  aortic valve stenosis. Pulmonic Valve: The pulmonic valve was not well visualized. Pulmonic valve regurgitation is not visualized. Aorta: The aortic root and ascending aorta are structurally normal, with no evidence of dilitation. Venous: The inferior vena cava is dilated in size with less than 50% respiratory variability, suggesting right atrial pressure of 15 mmHg. IAS/Shunts: The interatrial septum was not well visualized.  LEFT VENTRICLE PLAX 2D LVIDd:         3.90 cm LVIDs:         2.60 cm LV PW:         1.30 cm LV  IVS:        1.30 cm LVOT diam:     1.60 cm LV SV:         53 LV SV Index:   28 LVOT Area:     2.01 cm  RIGHT VENTRICLE RV S prime:     12.90 cm/s TAPSE (M-mode): 2.0 cm LEFT ATRIUM             Index       RIGHT ATRIUM           Index LA diam:        3.50 cm 1.87 cm/m  RA Area:     17.90 cm LA Vol (A2C):   40.7 ml 21.69 ml/m RA Volume:   48.00 ml  25.58 ml/m LA Vol (A4C):   39.0 ml 20.79 ml/m LA Biplane Vol: 41.5 ml 22.12 ml/m  AORTIC VALVE LVOT Vmax:   132.00 cm/s LVOT Vmean:  89.700 cm/s LVOT VTI:    0.262 m  AORTA Ao Root diam: 2.70 cm MITRAL VALVE                TRICUSPID VALVE MV Area (PHT): 2.26 cm     TR Peak grad:   64.6 mmHg MV Area VTI:   1.03 cm     TR Vmax:        402.00 cm/s MV Peak grad:  20.4 mmHg MV Mean grad:  7.7 mmHg     SHUNTS MV Vmax:       2.26 m/s     Systemic VTI:  0.26 m MV Vmean:      129.7 cm/s   Systemic Diam: 1.60 cm MV Decel Time: 336 msec MV E velocity: 139.00 cm/s MV A velocity: 196.00 cm/s MV E/A ratio:  0.71 Oswaldo Milian MD Electronically signed by Oswaldo Milian MD Signature Date/Time: 11/13/2020/2:39:00 PM    Final     Microbiology Recent Results (from the past 240 hour(s))  Culture, blood (routine x 2)     Status: None (Preliminary result)   Collection Time: 11/24/20  9:32 AM   Specimen: BLOOD RIGHT HAND  Result Value Ref Range Status   Specimen Description BLOOD RIGHT HAND  Final   Special Requests   Final    BOTTLES DRAWN AEROBIC AND ANAEROBIC Blood Culture results may not be optimal due to an inadequate volume of blood received in culture bottles   Culture   Final    NO GROWTH 4 DAYS Performed at [facility], [address] [place], [place] [address]    Report Status PENDING  Incomplete  Culture, blood (routine x 2)     Status: None (Preliminary result)   Collection Time: 11/24/20  9:37 AM   Specimen: BLOOD RIGHT HAND  Result Value Ref Range Status   Specimen Description BLOOD RIGHT HAND  Final   Special Requests   Final  BOTTLES DRAWN AEROBIC AND ANAEROBIC Blood Culture results may not be optimal due to an inadequate volume of blood received in culture bottles   Culture   Final    NO GROWTH 4 DAYS Performed at [facility], [place] [address], [place], [place] [address]    Report Status PENDING  Incomplete  MRSA PCR Screening     Status: None   Collection Time: 11/24/20 12:02 PM   Specimen: Nasal Mucosa; Nasopharyngeal  Result Value Ref Range Status   MRSA by PCR NEGATIVE NEGATIVE Final    Comment:        The GeneXpert MRSA Assay (FDA approved for NASAL specimens only), is one component of a comprehensive MRSA colonization surveillance program. It is not intended to diagnose MRSA infection nor to guide or monitor treatment for MRSA infections. Performed at [facility], [place] [address] [place], [place] [address]     Lab Basic Metabolic Panel: Recent Labs  Lab 11/22/20 0400 11/23/20 0118 11/24/20 0728  NA 135 132* 130*  K 3.4* 4.6 4.9  CL 94* 92* 90*  CO2 27 28 27   GLUCOSE 127* 125* 223*  BUN 16 27* 51*  CREATININE 2.55* 4.24* 5.73*  CALCIUM 8.4* 8.8* 9.2  MG  --   --  2.3  PHOS 3.8 5.8* 5.7*   Liver Function Tests: Recent Labs  Lab 11/22/20 0400 11/23/20 0118 11/24/20 0728  ALBUMIN 2.6* 2.6* 2.5*   No results for input(s): LIPASE, AMYLASE in the last 168 hours. No results for input(s): AMMONIA in the last 168 hours. CBC: Recent Labs  Lab 11/22/20 0400 11/23/20 0118 11/24/20 0728  WBC 13.8* 19.4* 14.9*  HGB 11.1* 11.0* 9.9*  HCT 39.1 37.7 33.8*  MCV 108.0* 105.3* 102.4*  PLT 285 227 316   Cardiac Enzymes: No results for input(s): CKTOTAL, CKMB, CKMBINDEX, TROPONINI in the last 168 hours. Sepsis Labs: Recent Labs  Lab 11/22/20 0400 11/23/20 0118 11/24/20 0728  WBC 13.8* 19.4* 14.9*    Procedures/Operations  As per EMR   Bonna Gains Enez Monahan 11/28/2020, 5:10 PM

## 2020-12-06 NOTE — Progress Notes (Signed)
RT attempted ABG x2 with doppler. Unable to obtain. RN aware. Patient placed on Bipap. RT will continue to monitor.

## 2020-12-06 NOTE — Progress Notes (Signed)
AVAPS Max 30 Min 10 VT 450

## 2020-12-06 NOTE — Progress Notes (Signed)
[facility] Progress Note   Subjective:  Seen in ICU.    Objective Vitals:   November 29, 2020 0900 2020/11/29 1000 November 29, 2020 1015 November 29, 2020 1104  BP: (!) 88/60 (!) 96/55    Pulse: 83 84 88   Resp: (!) 32 (!) 31 (!) 33   Temp:    (!) 100.7 F (38.2 C)  TempSrc:    Oral  SpO2: 98% 100% 98%   Weight:      Height:       Physical Exam General: elderly, frail, awake, NG tube in place Heart: RRR Lungs: diminished air entry bibasilar Abdomen: soft, non-tender Extremities: lower legs wrapped, minimal LE edema Neuro: lethargic, arousable Dialysis Access: LUE AVF + bruit  Additional Objective Labs: Basic Metabolic Panel: Recent Labs  Lab 11/22/20 0400 11/23/20 0118 11/24/20 0728  NA 135 132* 130*  K 3.4* 4.6 4.9  CL 94* 92* 90*  CO2 27 28 27   GLUCOSE 127* 125* 223*  BUN 16 27* 51*  CREATININE 2.55* 4.24* 5.73*  CALCIUM 8.4* 8.8* 9.2  PHOS 3.8 5.8* 5.7*   Liver Function Tests: Recent Labs  Lab 11/22/20 0400 11/23/20 0118 11/24/20 0728  ALBUMIN 2.6* 2.6* 2.5*   No results for input(s): LIPASE, AMYLASE in the last 168 hours. CBC: Recent Labs  Lab 11/20/20 0030 11/21/20 0055 11/22/20 0400 11/23/20 0118 11/24/20 0728  WBC 10.9* 14.2* 13.8* 19.4* 14.9*  HGB 11.4* 10.6* 11.1* 11.0* 9.9*  HCT 36.3 36.0 39.1 37.7 33.8*  MCV 96.0 103.4* 108.0* 105.3* 102.4*  PLT 242 269 285 227 316   CBG: Recent Labs  Lab 11/24/20 1916 11/24/20 2307 Nov 29, 2020 0323 2020-11-29 0713 Nov 29, 2020 1103  GLUCAP 201* 175* 204* 227* 263*   Studies/Results: DG CHEST PORT 1 VIEW  Result Date: 11/24/2020 CLINICAL DATA:  Hypoxia, weakness EXAM: PORTABLE CHEST 1 VIEW COMPARISON:  Portable exam at 0928 hrs compared to 11/19/2020 FINDINGS: Feeding tube traverses esophagus and stomach. Enlargement of cardiac silhouette with vascular congestion. Atherosclerotic calcifications aorta. Endotracheal tube removed. Perihilar infiltrates question pulmonary edema, slightly increased in LEFT perihilar  region. Small bibasilar pleural effusions and basilar atelectasis. No pneumothorax. Bones demineralized. IMPRESSION: Enlargement of cardiac silhouette with pulmonary vascular congestion and probable pulmonary edema. Small bibasilar pleural effusions and basilar atelectasis. Electronically Signed   By: Lavonia Dana M.D.   On: 11/24/2020 11:01   Medications: . sodium chloride Stopped (11/24/20 1426)  . sodium chloride Stopped (11/21/20 0327)  . ceFEPime (MAXIPIME) IV 1 g (11/29/20 1010)  . feeding supplement (NEPRO CARB STEADY) 1,000 mL (11/24/20 2323)   . aspirin  81 mg Per Tube Daily  . atorvastatin  40 mg Per Tube q1800  . docusate  100 mg Per Tube BID  . feeding supplement (PROSource TF)  45 mL Per Tube TID  . heparin  5,000 Units Subcutaneous Q8H  . insulin aspart  0-9 Units Subcutaneous Q4H  . insulin glargine  15 Units Subcutaneous Daily  . mouth rinse  15 mL Mouth Rinse q12n4p  . midodrine  10 mg Per Tube TID with meals  . multivitamin  1 tablet Per Tube QHS  . pantoprazole sodium  40 mg Per Tube Daily  . polyethylene glycol  17 g Per Tube Daily  . scopolamine  1 patch Transdermal Q72H    OP HD: [facility] (last HD 4/2, prior to that last was 3/24)  4h  350/500  82kg  2/2 bath  P4  AVF  Hep none - Hectoral 55mcg IV q HD  Assessment/Plan  1. Resp failure acute w/ hypoxia and hypercapnia - per CCM due to aspiration pneumonitis, possible OSA / OHS as well. ECHO showed pulm HTN, HFpEF, mod-severe MS. Mgmt per CCM. Extubated again 4/15. UF as tolerated with HD. Pt is now DNR.  2. ESRD: maintain TTS scheduled here. She cannot get any midodrine if on Bipap or npo. Next HD 4/21. Not a good candidate for CRRT. Cont supportive care as tolerated.  3. BP/ volume - BP's remain soft in the 90's, on midodrine 10 tid.   4. Volume - 1-2kg over dry wt, UF 2-3L as tolerated w/ HD tomorrow 5. Anemia of ESRD: no ESA need at this time, hgb at/around goal, monitor for now 6. MBD ckd: stopped binders due  to low phos, no binders for now 7. T2DM: per primary 8. PAD/leg wounds: ischemic/stasis dermatitis per WOC + primary 9. CAD/HFrEF: UF as tolerated 10. GOC: pt is DNR now. Poor prognosis overall.    [name], MD 12-14-20, 12:53 PM

## 2020-12-06 DEATH — deceased

## 2021-09-10 ENCOUNTER — Encounter (HOSPITAL_COMMUNITY): Payer: Medicare HMO
